# Patient Record
Sex: Male | Born: 1950
Health system: Southern US, Community
[De-identification: ages and names within clinical notes are randomized; demographics above are authoritative.]

## PROBLEM LIST (undated history)

## (undated) DIAGNOSIS — I6529 Occlusion and stenosis of unspecified carotid artery: Secondary | ICD-10-CM

## (undated) DIAGNOSIS — F17200 Nicotine dependence, unspecified, uncomplicated: Secondary | ICD-10-CM

## (undated) DIAGNOSIS — R011 Cardiac murmur, unspecified: Secondary | ICD-10-CM

## (undated) DIAGNOSIS — J302 Other seasonal allergic rhinitis: Secondary | ICD-10-CM

## (undated) DIAGNOSIS — G709 Myoneural disorder, unspecified: Secondary | ICD-10-CM

## (undated) DIAGNOSIS — G4730 Sleep apnea, unspecified: Secondary | ICD-10-CM

## (undated) DIAGNOSIS — I350 Nonrheumatic aortic (valve) stenosis: Secondary | ICD-10-CM

## (undated) DIAGNOSIS — F1010 Alcohol abuse, uncomplicated: Secondary | ICD-10-CM

## (undated) DIAGNOSIS — I739 Peripheral vascular disease, unspecified: Secondary | ICD-10-CM

## (undated) DIAGNOSIS — F32A Depression, unspecified: Secondary | ICD-10-CM

## (undated) DIAGNOSIS — M1990 Unspecified osteoarthritis, unspecified site: Secondary | ICD-10-CM

## (undated) DIAGNOSIS — G8929 Other chronic pain: Secondary | ICD-10-CM

## (undated) DIAGNOSIS — H269 Unspecified cataract: Secondary | ICD-10-CM

## (undated) DIAGNOSIS — I2510 Atherosclerotic heart disease of native coronary artery without angina pectoris: Secondary | ICD-10-CM

## (undated) DIAGNOSIS — Z720 Tobacco use: Secondary | ICD-10-CM

## (undated) DIAGNOSIS — I452 Bifascicular block: Secondary | ICD-10-CM

## (undated) DIAGNOSIS — J189 Pneumonia, unspecified organism: Secondary | ICD-10-CM

## (undated) DIAGNOSIS — T7840XA Allergy, unspecified, initial encounter: Secondary | ICD-10-CM

## (undated) DIAGNOSIS — K759 Inflammatory liver disease, unspecified: Secondary | ICD-10-CM

## (undated) DIAGNOSIS — E785 Hyperlipidemia, unspecified: Secondary | ICD-10-CM

## (undated) DIAGNOSIS — I4510 Unspecified right bundle-branch block: Secondary | ICD-10-CM

## (undated) DIAGNOSIS — Z808 Family history of malignant neoplasm of other organs or systems: Secondary | ICD-10-CM

## (undated) DIAGNOSIS — F429 Obsessive-compulsive disorder, unspecified: Secondary | ICD-10-CM

## (undated) DIAGNOSIS — F319 Bipolar disorder, unspecified: Secondary | ICD-10-CM

## (undated) DIAGNOSIS — I10 Essential (primary) hypertension: Secondary | ICD-10-CM

## (undated) DIAGNOSIS — M549 Dorsalgia, unspecified: Secondary | ICD-10-CM

## (undated) DIAGNOSIS — M48061 Spinal stenosis, lumbar region without neurogenic claudication: Secondary | ICD-10-CM

## (undated) DIAGNOSIS — G629 Polyneuropathy, unspecified: Secondary | ICD-10-CM

## (undated) DIAGNOSIS — F329 Major depressive disorder, single episode, unspecified: Secondary | ICD-10-CM

## (undated) HISTORY — DX: Polyneuropathy, unspecified: G62.9

## (undated) HISTORY — PX: BACK SURGERY: SHX140

## (undated) HISTORY — DX: Nicotine dependence, unspecified, uncomplicated: F17.200

## (undated) HISTORY — DX: Nonrheumatic aortic (valve) stenosis: I35.0

## (undated) HISTORY — DX: Obsessive-compulsive disorder, unspecified: F42.9

## (undated) HISTORY — DX: Peripheral vascular disease, unspecified: I73.9

## (undated) HISTORY — DX: Myoneural disorder, unspecified: G70.9

## (undated) HISTORY — PX: TONSILLECTOMY: SUR1361

## (undated) HISTORY — DX: Unspecified cataract: H26.9

## (undated) HISTORY — PX: ELBOW SURGERY: SHX618

## (undated) HISTORY — DX: Alcohol abuse, uncomplicated: F10.10

## (undated) HISTORY — DX: Tobacco use: Z72.0

## (undated) HISTORY — DX: Allergy, unspecified, initial encounter: T78.40XA

## (undated) HISTORY — DX: Other seasonal allergic rhinitis: J30.2

## (undated) HISTORY — DX: Family history of malignant neoplasm of other organs or systems: Z80.8

## (undated) HISTORY — DX: Spinal stenosis, lumbar region without neurogenic claudication: M48.061

## (undated) HISTORY — DX: Hyperlipidemia, unspecified: E78.5

## (undated) HISTORY — DX: Occlusion and stenosis of unspecified carotid artery: I65.29

## (undated) HISTORY — DX: Bipolar disorder, unspecified: F31.9

## (undated) HISTORY — DX: Cardiac murmur, unspecified: R01.1

## (undated) HISTORY — PX: VASECTOMY: SHX75

## (undated) HISTORY — PX: HERNIA REPAIR: SHX51

## (undated) HISTORY — DX: Sleep apnea, unspecified: G47.30

## (undated) HISTORY — DX: Bifascicular block: I45.2

## (undated) HISTORY — DX: Atherosclerotic heart disease of native coronary artery without angina pectoris: I25.10

## (undated) HISTORY — PX: VASECTOMY REVERSAL: SHX243

## (undated) HISTORY — DX: Essential (primary) hypertension: I10

## (undated) NOTE — *Deleted (*Deleted)
  AAA Progress Note    10/06/2020 7:55 AM 3 Days Post-Op  Subjective:  ***  Afebrile HR 60's-70's NSR 150's systolic 97% 1LO2NC  Gtts:  None   Vitals:   10/06/20 0352 10/06/20 0746  BP: (!) 156/72 (!) 154/69  Pulse: 67 71  Resp: 17 18  Temp: 98.2 F (36.8 C) 98.1 F (36.7 C)  SpO2: 95% 97%    Physical Exam: Cardiac:  *** Lungs:  *** Abdomen:  *** Incisions:  *** Extremities:  *** General:  ***  CBC    Component Value Date/Time   WBC 12.4 (H) 10/06/2020 0048   RBC 3.21 (L) 10/06/2020 0048   HGB 10.0 (L) 10/06/2020 0048   HGB 16.1 11/02/2019 1000   HCT 30.0 (L) 10/06/2020 0048   HCT 47.2 11/02/2019 1000   PLT 82 (L) 10/06/2020 0048   PLT 177 11/02/2019 1000   MCV 93.5 10/06/2020 0048   MCV 90 11/02/2019 1000   MCH 31.2 10/06/2020 0048   MCHC 33.3 10/06/2020 0048   RDW 14.6 10/06/2020 0048   RDW 13.6 11/02/2019 1000   LYMPHSABS 1.7 11/28/2019 0404   LYMPHSABS 2.5 11/02/2019 1000   MONOABS 1.1 (H) 11/28/2019 0404   EOSABS 0.1 11/28/2019 0404   EOSABS 0.2 11/02/2019 1000   BASOSABS 0.0 11/28/2019 0404   BASOSABS 0.1 11/02/2019 1000    BMET    Component Value Date/Time   NA 141 10/06/2020 0048   NA 139 04/16/2020 1046   K 4.6 10/06/2020 0048   CL 111 10/06/2020 0048   CO2 19 (L) 10/06/2020 0048   GLUCOSE 109 (H) 10/06/2020 0048   BUN 24 (H) 10/06/2020 0048   BUN 14 04/16/2020 1046   CREATININE 0.92 10/06/2020 0048   CREATININE 0.77 06/29/2017 1534   CALCIUM 8.0 (L) 10/06/2020 0048   GFRNONAA >60 10/06/2020 0048   GFRAA 98 04/16/2020 1046    INR    Component Value Date/Time   INR 1.3 (H) 10/03/2020 1621     Intake/Output Summary (Last 24 hours) at 10/06/2020 0755 Last data filed at 10/06/2020 0746 Gross per 24 hour  Intake 2258.88 ml  Output 1445 ml  Net 813.88 ml     Assessment/Plan:  82 y.o. male is s/p  *** 3 Days Post-Op  -Vascular:  *** -Cardiac:  Hemodynamically stable in NSR.   -Pulmonary:  *** -Neuro:  ***  -Renal:  Creatinine back to normal at 0.92.  Hyperkalemia resolved.  -GI:  *** -Incisions:  *** -Heme/ID:  Acute surgical blood loss anemia drifted down slightly today but stable.  Pt is tolerating.  Thrombocytopenia improved today.  -General:  Doreatha Massed, PA-C [facility] [phone] 10/06/2020 7:55 AM

---
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
## 2007-12-23 HISTORY — PX: COLONOSCOPY: SHX174

## 2008-07-26 ENCOUNTER — Ambulatory Visit: Payer: Self-pay | Admitting: Family Medicine

## 2008-09-08 ENCOUNTER — Ambulatory Visit: Payer: Self-pay | Admitting: Family Medicine

## 2008-09-08 ENCOUNTER — Encounter: Payer: Self-pay | Admitting: Gastroenterology

## 2008-10-18 ENCOUNTER — Ambulatory Visit: Payer: Self-pay | Admitting: Gastroenterology

## 2008-10-18 ENCOUNTER — Ambulatory Visit: Payer: Self-pay | Admitting: Family Medicine

## 2008-10-27 ENCOUNTER — Telehealth: Payer: Self-pay | Admitting: Gastroenterology

## 2008-11-01 ENCOUNTER — Ambulatory Visit: Payer: Self-pay | Admitting: Gastroenterology

## 2008-11-01 ENCOUNTER — Encounter: Payer: Self-pay | Admitting: Gastroenterology

## 2008-11-04 ENCOUNTER — Encounter: Payer: Self-pay | Admitting: Gastroenterology

## 2009-01-03 ENCOUNTER — Ambulatory Visit: Payer: Self-pay | Admitting: Family Medicine

## 2009-06-28 ENCOUNTER — Ambulatory Visit: Payer: Self-pay | Admitting: Family Medicine

## 2009-06-28 ENCOUNTER — Encounter: Admission: RE | Admit: 2009-06-28 | Discharge: 2009-06-28 | Payer: Self-pay | Admitting: Family Medicine

## 2009-08-03 ENCOUNTER — Observation Stay (HOSPITAL_COMMUNITY): Admission: EM | Admit: 2009-08-03 | Discharge: 2009-08-04 | Payer: Self-pay | Admitting: Emergency Medicine

## 2009-08-06 ENCOUNTER — Ambulatory Visit: Payer: Self-pay | Admitting: Family Medicine

## 2010-02-05 ENCOUNTER — Ambulatory Visit: Payer: Self-pay | Admitting: Family Medicine

## 2010-04-11 ENCOUNTER — Ambulatory Visit: Payer: Self-pay | Admitting: Family Medicine

## 2010-05-02 ENCOUNTER — Encounter: Admission: RE | Admit: 2010-05-02 | Discharge: 2010-05-02 | Payer: Self-pay | Admitting: Surgery

## 2010-05-22 ENCOUNTER — Ambulatory Visit: Payer: Self-pay | Admitting: Family Medicine

## 2010-06-20 HISTORY — PX: US ECHOCARDIOGRAPHY: HXRAD669

## 2010-07-03 ENCOUNTER — Ambulatory Visit: Payer: Self-pay | Admitting: Vascular Surgery

## 2010-10-08 ENCOUNTER — Ambulatory Visit: Payer: Self-pay | Admitting: Family Medicine

## 2010-11-22 ENCOUNTER — Ambulatory Visit: Payer: Self-pay | Admitting: Family Medicine

## 2010-11-22 ENCOUNTER — Encounter: Admission: RE | Admit: 2010-11-22 | Discharge: 2010-11-22 | Payer: Self-pay | Admitting: Family Medicine

## 2011-01-17 ENCOUNTER — Ambulatory Visit: Payer: Self-pay | Admitting: Cardiology

## 2011-01-22 ENCOUNTER — Encounter: Payer: Self-pay | Admitting: Emergency Medicine

## 2011-03-30 LAB — CK TOTAL AND CKMB (NOT AT ARMC)
CK, MB: 7.1 ng/mL — ABNORMAL HIGH (ref 0.3–4.0)
Relative Index: 5.4 — ABNORMAL HIGH (ref 0.0–2.5)
Total CK: 131 U/L (ref 7–232)

## 2011-03-30 LAB — DIFFERENTIAL
Basophils Absolute: 0 10*3/uL (ref 0.0–0.1)
Basophils Relative: 0 % (ref 0–1)
Eosinophils Absolute: 0 10*3/uL (ref 0.0–0.7)
Eosinophils Relative: 0 % (ref 0–5)
Lymphocytes Relative: 22 % (ref 12–46)
Lymphs Abs: 1.9 10*3/uL (ref 0.7–4.0)
Monocytes Absolute: 0.9 10*3/uL (ref 0.1–1.0)
Monocytes Relative: 11 % (ref 3–12)
Neutro Abs: 5.6 10*3/uL (ref 1.7–7.7)
Neutrophils Relative %: 67 % (ref 43–77)

## 2011-03-30 LAB — CBC
HCT: 41.4 % (ref 39.0–52.0)
HCT: 45.5 % (ref 39.0–52.0)
Hemoglobin: 14.4 g/dL (ref 13.0–17.0)
Hemoglobin: 15.8 g/dL (ref 13.0–17.0)
MCHC: 34.7 g/dL (ref 30.0–36.0)
MCHC: 34.9 g/dL (ref 30.0–36.0)
MCV: 96.8 fL (ref 78.0–100.0)
MCV: 97.4 fL (ref 78.0–100.0)
Platelets: 188 10*3/uL (ref 150–400)
Platelets: 232 10*3/uL (ref 150–400)
RBC: 4.24 MIL/uL (ref 4.22–5.81)
RBC: 4.7 MIL/uL (ref 4.22–5.81)
RDW: 13.6 % (ref 11.5–15.5)
RDW: 13.6 % (ref 11.5–15.5)
WBC: 8.4 10*3/uL (ref 4.0–10.5)
WBC: 8.9 10*3/uL (ref 4.0–10.5)

## 2011-03-30 LAB — BASIC METABOLIC PANEL
BUN: 10 mg/dL (ref 6–23)
BUN: 9 mg/dL (ref 6–23)
CO2: 27 mEq/L (ref 19–32)
CO2: 28 mEq/L (ref 19–32)
Calcium: 8.5 mg/dL (ref 8.4–10.5)
Calcium: 8.8 mg/dL (ref 8.4–10.5)
Chloride: 102 mEq/L (ref 96–112)
Chloride: 104 mEq/L (ref 96–112)
Creatinine, Ser: 0.66 mg/dL (ref 0.4–1.5)
Creatinine, Ser: 0.72 mg/dL (ref 0.4–1.5)
GFR calc Af Amer: >60 mL/min (ref >60)
GFR calc Af Amer: >60 mL/min (ref >60)
GFR calc non Af Amer: >60 mL/min (ref >60)
GFR calc non Af Amer: >60 mL/min (ref >60)
Glucose, Bld: 132 mg/dL — ABNORMAL HIGH (ref 70–99)
Glucose, Bld: 97 mg/dL (ref 70–99)
Potassium: 3.7 mEq/L (ref 3.5–5.1)
Potassium: 4.1 mEq/L (ref 3.5–5.1)
Sodium: 136 mEq/L (ref 135–145)
Sodium: 142 mEq/L (ref 135–145)

## 2011-03-30 LAB — CARDIAC PANEL(CRET KIN+CKTOT+MB+TROPI)
CK, MB: 8.2 ng/mL — ABNORMAL HIGH (ref 0.3–4.0)
CK, MB: 8.4 ng/mL — ABNORMAL HIGH (ref 0.3–4.0)
Relative Index: 6.5 — ABNORMAL HIGH (ref 0.0–2.5)
Relative Index: 6.8 — ABNORMAL HIGH (ref 0.0–2.5)
Total CK: 121 U/L (ref 7–232)
Total CK: 129 U/L (ref 7–232)
Troponin I: 0.02 ng/mL (ref 0.00–0.06)
Troponin I: 0.03 ng/mL (ref 0.00–0.06)

## 2011-03-30 LAB — RAPID URINE DRUG SCREEN, HOSP PERFORMED
Amphetamines: NOT DETECTED
Barbiturates: NOT DETECTED
Benzodiazepines: NOT DETECTED
Cocaine: NOT DETECTED
Opiates: NOT DETECTED
Tetrahydrocannabinol: NOT DETECTED

## 2011-03-30 LAB — TROPONIN I: Troponin I: 0.04 ng/mL (ref 0.00–0.06)

## 2011-03-30 LAB — LIPID PANEL
Cholesterol: 158 mg/dL (ref 0–200)
HDL: 57 mg/dL (ref >39)
LDL Cholesterol: 71 mg/dL (ref 0–99)
Total CHOL/HDL Ratio: 2.8 RATIO
Triglycerides: 149 mg/dL (ref <150)
VLDL: 30 mg/dL (ref 0–40)

## 2011-03-30 LAB — ETHANOL: Alcohol, Ethyl (B): <5 mg/dL (ref 0–10)

## 2011-03-30 LAB — D-DIMER, QUANTITATIVE: D-Dimer, Quant: 0.34 ug/mL-FEU (ref 0.00–0.48)

## 2011-03-30 LAB — TSH: TSH: 2.679 u[IU]/mL (ref 0.350–4.500)

## 2011-03-31 ENCOUNTER — Telehealth: Payer: Self-pay | Admitting: Cardiology

## 2011-03-31 ENCOUNTER — Telehealth: Payer: Self-pay | Admitting: *Deleted

## 2011-03-31 NOTE — Telephone Encounter (Signed)
Gregory [name]: Wants billing information from 2011 with [facility]  Gregory [name]: Wants last referral that Dr. [place] gave him so that he can call

## 2011-03-31 NOTE — Telephone Encounter (Signed)
SENT PATIENT DH FROM 2011.

## 2011-03-31 NOTE — Telephone Encounter (Signed)
Called wanting the group that he was sent to last year for doppler study. Gave him [facility].

## 2011-04-16 ENCOUNTER — Other Ambulatory Visit: Payer: Self-pay | Admitting: Emergency Medicine

## 2011-04-16 DIAGNOSIS — R059 Cough, unspecified: Secondary | ICD-10-CM

## 2011-04-16 DIAGNOSIS — R05 Cough: Secondary | ICD-10-CM

## 2011-04-28 ENCOUNTER — Ambulatory Visit
Admission: RE | Admit: 2011-04-28 | Discharge: 2011-04-28 | Disposition: A | Discharge status: Home / Self Care | Payer: BC Managed Care – PPO | Source: Ambulatory Visit | Attending: Emergency Medicine | Admitting: Emergency Medicine

## 2011-04-28 ENCOUNTER — Encounter: Payer: Self-pay | Admitting: Emergency Medicine

## 2011-04-28 DIAGNOSIS — R05 Cough: Secondary | ICD-10-CM

## 2011-04-28 DIAGNOSIS — R059 Cough, unspecified: Secondary | ICD-10-CM

## 2011-04-28 MED ORDER — IOHEXOL 300 MG/ML  SOLN
75.0000 mL | Freq: Once | INTRAMUSCULAR | Status: AC | PRN
  Administered 2011-04-28: 75 mL via INTRAVENOUS

## 2011-05-05 ENCOUNTER — Ambulatory Visit (INDEPENDENT_AMBULATORY_CARE_PROVIDER_SITE_OTHER): Payer: BC Managed Care – PPO | Admitting: Family Medicine

## 2011-05-05 ENCOUNTER — Encounter: Payer: Self-pay | Admitting: Family Medicine

## 2011-05-05 DIAGNOSIS — G5620 Lesion of ulnar nerve, unspecified upper limb: Secondary | ICD-10-CM

## 2011-05-05 DIAGNOSIS — J309 Allergic rhinitis, unspecified: Secondary | ICD-10-CM

## 2011-05-05 DIAGNOSIS — J329 Chronic sinusitis, unspecified: Secondary | ICD-10-CM

## 2011-05-05 NOTE — Progress Notes (Signed)
  Subjective:    Patient ID: Gregory [name], male    DOB: 02-Jan-1951, 60 y.o.   MRN: [id number]  Sinus Problem  Hand Injury   Hand Pain    he has been having difficulty with sinus problems since January. He has been seen in an urgent care Center on several occasions. He has been on 2 different rounds of antibiotics and has had x-rays. Most recently he was given Avelox and prednisone. He finished this oxylate 10 days ago and states that he is still having difficulty with earache, itching ears, chills, itchy eyes . He states that they did an x-ray and also an MRI to rule out any Pap quality which was apparently negative. She is complaining earache, itching, chills no rhinorrhea or PND. He does continue to smoke. He also continues on other medications listed in the chart. He complains of bilateral tingling sensation in the fourth and fifth fingers of both hands. He cannot relate this to wrist or elbow position. He has not dropped anything. He also complains of discoloration especially in the right arm.    Review of Systems    negative except as above Objective:   Physical Exam  Constitutional: He is oriented to person, place, and time. He appears well-nourished.  HENT:  Head: Normocephalic.  Right Ear: External ear normal.  Left Ear: External ear normal.  Nose: Nose normal.  Mouth/Throat: Oropharynx is clear and moist.  Eyes: Conjunctivae are normal. Pupils are equal, round, and reactive to light.  Neck: Normal range of motion. No thyromegaly present.  Cardiovascular: Normal rate and regular rhythm.  Exam reveals no gallop.   No murmur heard. Pulmonary/Chest: Effort normal. He has wheezes.  Lymphadenopathy:    He has no cervical adenopathy.  Neurological: He is alert and oriented to person, place, and time. No cranial nerve deficit.  Skin: Skin is warm and dry.  Psychiatric: He has a normal mood and affect.   exam of both arms does show recent trauma some discoloration to the right  wrist. Full motion in the elbow. Normal hand strength. Resonant to bilaterally.        Assessment & Plan:  Probable unresolved sinusitis in this smoker. Possible bilateral ulnar neuropathy Asthma He is to take his Advair regularly but she admits she has not done. I will give him Avelox. Recommend heat pack of his symptoms regarding the angling sensation and let me know if wrist or elbow position makes a difference.

## 2011-05-05 NOTE — Patient Instructions (Signed)
The attention to elbow position in regard to the symptoms in both hands. The antibiotic and continue on Advair as well as use Claritin recheck here in 2 weeks

## 2011-05-06 NOTE — Discharge Summary (Signed)
Gregory [name], Gregory [name]            ACCOUNT NO.:  [id number]   MEDICAL RECORD NO.:  [id number]          PATIENT TYPE:  INP   LOCATION:  3014                         FACILITY:  [facility]   PHYSICIAN:  Isidor Holts, M.D.  DATE OF BIRTH:  1951/12/22   DATE OF ADMISSION:  08/03/2009  DATE OF DISCHARGE:  08/04/2009                               DISCHARGE SUMMARY   PRIMARY MD:  [name], M.D.   PRIMARY PSYCHIATRIST:  [name], M.D.   PRIMARY ORTHOPEDIC SURGEON:  [name], M.D.   DISCHARGE DIAGNOSES:  1. Uncontrolled hypertension/hypertensive urgency.  2. Exacerbation of bronchial asthma.  3. Allergic rhinitis.  4. Bipolar disorder.  5. History of partial seizure disorder.  6. Smoking history.  7. Back pain.  8. Dyslipidemia.  9. Alcohol excess.   DISCHARGE MEDICATIONS:  1. Depakote ER 1000 mg p.o. q.h.s.  2. Lamictal 200 mg p.o. daily.  3. Lisinopril/Hydrochlorothiazide (20/12.5) one p.o. daily.  4. Albuterol inhaler 2 puffs p.r.n. q.4-6 hourly.  5. Prednisone taper in pre-admission dosage.  6. Clonidine 0.2 mg p.o. t.i.d.  7. Flonase nasal spray, 2 sprays each nostril daily p.r.n.   Note:  Ibuprofen has been discontinued.   PROCEDURES:  Chest x-ray done August 03, 2009.  This showed mild chronic-  appearing bronchitis-type changes, no infiltrates, edema or effusions.   CONSULTATIONS:  None.   ADMISSION HISTORY:  As in H and P notes of August 03, 2009.  However in  brief, this is a 60 year old male, with known history of bipolar  disorder, partial seizure disorder, hypertension, dyslipidemia, history  of mononucleosis with hepatitis while in college approximately 40 years  ago, bronchial asthma since childhood, perennial allergic rhinitis,  smoking history, status post fall early in July 2010, back pain  secondary to this, presenting with increased shortness of breath and  severe uncontrolled hypertension.  Apparently he went to bed at about 9  p.m. on August 02, 2009, awoke in the morning feeling very lethargic and  subsequently at work became slightly short of breath and wheezy.  He  tried to drive to his primary MD's office, but felt so bad that he  stopped at [facility], where he was evaluated, found to  have markedly elevated blood pressures at 224/112.  He was nebulized.  EKG was done.  This showed a right bundle branch block pattern.  EMS was  called, and he was transferred to the emergency department at [facility], and subsequently admitted for further evaluation,  investigation and management.   CLINICAL COURSE:  1. Severe uncontrolled hypertension/hypertensive urgency.  For details      of presentation, refer to admission history above.  This was      managed with ACE inhibitor, Hydrochlorothiazide and Clonidine, with      satisfactory clinical response.  As of August 04, 2009, blood      pressure had improved to 173/76 mmHg and the patient was      asymptomatic.  Although this is still uncontrolled, it is a      significant improvement over his presenting blood pressures, and  further improvement is anticipated.  The patient has been      recommended to discontinue Ibuprofen, as this may be contributory      to his elevated blood pressures and to maintain a low-salt diet.      He is to follow up ASAP in the coming week ,with his primary MD,      Dr. Susann Givens, who will titrate his blood pressure medications      further, if indicated.  The patient is agreeable with this plan.   1. Exacerbation of bronchial asthma.  The patient was wheezy at time      of presentation; however, chest x-ray was unremarkable for acute      abnormalities, he had a cough productive of clear phlegm, and which      was managed with bronchodilator nebulizers and a tapering course of      steroids, with satisfactory clinical response.  By August 04, 2009,      he appeared to be at his baseline.  He does have a known history of       allergic rhinitis.  It is possible that this non-infective      exacerbation, is secondary to airborne allergens.   1. Allergic rhinitis.  This was managed with Flonase nasal spray      during the course of this hospitalization, and responded      satisfactorily.   1. Smoking history.  The patient does indeed smoke about 1 and 1/2      packs of cigarettes per day.  He was counseled appropriately and      was managed with Nicoderm CQ patch, in the course of this      hospitalization.   1. Alcohol excess.  The patient states that he drinks approximately 2      glasses of wine each night.  Alcohol level was less than 5, and he      showed no evidence of alcohol withdrawal phenomena, during the      course of this hospitalization.   1. Dyslipidemia.  The patient's lipid profile was as follows:  Total      cholesterol 158, triglyceride 149, HDL 57, LDL 71, i.e. excellent      lipid profile.  He has been reassured accordingly.   1. Back pain.  This, according to the patient, has intermittently      troubled him, since his fall in the early July 2010.  X-ray of the      sacrum, coccyx and lumbar spine on June 28, 2009, showed mild      degenerative disk disease at L4-5 with a 5-mm anterolisthesis of L4      and L5, but there were no acute abnormalities, no acute bony      injuries.  The patient states, however, that he did see Dr. [name], orthopedic surgeon, on August 01, 2009, and was commenced on      a tapering course of steroids, which we have urged him to continue.      Be that as it may, there were no problems referable to his back      pain, during the course of his hospitalization.   DISPOSITION:  The patient was, on August 04, 2009, considered clinically  stable for discharge, and was therefore discharged accordingly.  He is  recommended to return to regular duties on August 09, 2009.   ACTIVITY:  Recommended to increase activity slowly, otherwise as  tolerated.  DIET:  Heart-healthy.   FOLLOWUP INSTRUCTIONS:  The patient is to follow up with his primary MD,  Dr. [name], in the coming week.  He has been instructed to call  for an appointment in a.m. of August 06, 2009.  In addition, he is to  follow up  routinely with his primary psychiatrist, Dr. [name], per prior  scheduled appointment, as well as his orthopedic surgeon, Dr. [name],  per prior scheduled appointment.  All this has been communicated to the  patient, who verbalized understanding.      Isidor Holts, M.D.  Electronically Signed     CO/MEDQ  D:  08/04/2009  T:  08/04/2009  Job:  [id number]   cc:   [name], M.D.  [name], M.D.  [name], M.D.

## 2011-05-06 NOTE — H&P (Signed)
Gregory [name], Gregory NO.:  [id number]   MEDICAL RECORD NO.:  [id number]          PATIENT TYPE:  INP   LOCATION:  1833                         FACILITY:  [facility]   PHYSICIAN:  Isidor Holts, M.D.  DATE OF BIRTH:  [birth year]   DATE OF ADMISSION:  08/03/2009  DATE OF DISCHARGE:                              HISTORY & PHYSICAL   PRIMARY MEDICAL DOCTOR:  Gregory [name], M.D.   PSYCHIATRIST:  Milagros [name], M.D.   PRIMARY ORTHOPEDIC SURGEON:  [name], M.D.   CHIEF COMPLAINT:  Shortness of breath, lethargy, severe uncontrolled  hypertension.   HISTORY OF PRESENT ILLNESS:  This is a 60 year old male.  For past  medical history, see below.  History is obtained from the patient;  however, the patient appears anxious, fidgety, and speech is somewhat  pressured.  He had to be refocused several times, to elucidate a  coherent history.  According to the patient, he had a fall in early July  2010, and thought he had maybe fractured his tail bone.  He came to  the emergency department at that time, and had an x-ray done, which  showed no bony injuries in the coccyx.  Since then, he has been troubled  on and off, by low back pain, for which he utilizes Ibuprofen.  Eventually, he managed to get an appointment with an orthopedic surgeon  Dr. [name] on August 01, 2009.  He was offered intralesional  steroids, but he declined this, and was subsequently given a  prescription for oral Prednisone taper.  He filled the script on August 02, 2009, and took the first dose.  He estimates it was about 10 mg of  Prednisone, then went to bed at about 9:00 p.m. that day.  In the a.m.  of August 03, 2009, he awoke, but was feeling very lethargic, and even  though he had a cup of coffee, which would usually wake him right up,  this did not appear to have any effect.  He went to work at about 8:00  a.m., and by 9:00 a.m. had become sweaty, short of breath, and wheezy.  He denies any chest  time at that time.  He went out to his car to look  for his bronchodilator inhaler, could not find it, continued to feel  very unwell, and then decided to drive  to his primary MD's office. En  route, he felt so bad that he stopped at [facility],  where he was evaluated and found to have a markedly elevated blood  pressure as high as 224/112.  He was given bronchodilator nebulizers.  EKG was done.  Some abnormalities were noted.  EMS was called, and he  was transported to the emergency department at [facility].   PAST MEDICAL HISTORY:  1. Bipolar disorder.  2. History of partial seizure disorder.  3. Hypertension.  4. Dyslipidemia.  5. History of mononucleosis with hepatitis, in college approximately      40 years ago.  6. Bronchial asthma since childhood.  7. Perennial allergic rhinitis.  8. Smoking history.  9. Status post fall  early July 2010.  Evaluated at [facility]      ED.  No bony injury.  Has been troubled by back pain ever since.   MEDICATION HISTORY:  1. Depakote, query dosage, 2 pills at bedtime.  2. Lamictal, query dosage, 1 pill in a.m.  3. Lisinopril/HCT (20/12.5) one p.o. daily.  4. Ibuprofen 800 mg p.o. p.r.n. b.i.d..  5. Albuterol inhaler 2 puffs p.r.n.  6. Prednisone taper.  Took 10 mg on August 02, 2009, none since.   ALLERGIES:  1. NOVOCAIN.  2. CODEINE.  According to the patient, this causes angioedema.   REVIEW OF SYSTEMS:  As per HPI and chief complaint.  Denies abdominal  pain, vomiting or diarrhea.  Denies fever or chills.  Otherwise 10 point  review of systems is negative.   SOCIAL HISTORY:  The patient is married.  Has five daughters and 1  stepdaughter.  Manages a restaurant.  Smokes approximately 1 and 1/2  packet of cigarettes per day.  Has been smoking since age 15 years.  Drinks alcohol, about 1 to 2 glasses of red wine per night.  Denies drug  abuse.   FAMILY HISTORY:  Mother is age 90 years, has skin  cancer and is  hypertensive.  Father is deceased at age 69 years status post recurrent  CVAs Family history is otherwise noncontributory.   PHYSICAL EXAMINATION:  VITALS:  Temperature 98.7, pulse 60 per minute  regular, respiratory 16, BP 188/95 mmHg, rechecked 164/84 mmHg after  Clonidine administered in the emergency department, pulse oximeter 96%  and 2 liters of oxygen.  The patient did not appear to be in obvious acute distress at time of  this evaluation, alert, communicative, somewhat fidgety.  Speech appears  pressured.  Patient feels thirsty.  Continues to ask for a glass of  water; however, fully oriented and not short of breath at rest.  HEENT:  No clinical pallor or jaundice.  No conjunctival injection.  Throat: Visible mucous membranes appear clear.  NECK:  Supple.  JVP not seen.  No palpable lymphadenopathy or palpable  goiter.  CHEST:  Bilateral expiratory wheeze.  No crackles.  HEART:  Sounds, S1 and S2, are heard.  Normal regular, no murmurs.  ABDOMEN:  Flat, soft, nontender.  No palpable organomegaly or palpable  masses.  Normal bowel sounds.  LOWER EXTREMITIES:  No pitting edema.  Palpable peripheral pulses.  Status system quite unremarkable.  CENTRAL NERVOUS SYSTEM:  No focal neurologic deficit on gross  examination.   INVESTIGATIONS:  CBC:  WBC 8.4, hemoglobin 15.8, hematocrit 45.5,  platelets 232.  Electrolytes:  Sodium 142, potassium 3.7, chloride 104,  CO2 28, BUN 9, creatinine 0.06, glucose 97.   A 12-lead EKG done on August 03, 2009 showed sinus rhythm, regular 65  per minute, right bundle branch block pattern.  No acute ischemic  changes.   ASSESSMENT AND PLAN:  1. Severe uncontrolled hypertension/hypertensive urgency.  We shall      continue the patient's Lisinopril.  Hold Hydrochlorothiazide for      now, as the patient appears to be mildly clinically dehydrated,      commence scheduled Clonidine 0.1 mg p.o. t.i.d. and utilize p.r.n.      IV  hydralazine.  Meanwhile, we shall place the patient in the step-      down unit for close observation.   1. Shortness of breath.  This is likely secondary to the patient's      bronchial asthma, although it is  doubtful that he is having an      acute exacerbation.  We shall manage with bronchodilator      nebulizers, check D-dimer for completeness, although PE does not      does not seem likely at present.  Do chest x-ray, utilize      supplemental O2.   1. Allergic rhinitis.  We shall manage this with steroid nasal spray.   1. History of back pain:  This does not appear problematic at this      time.  We shall hold the patient's Prednisone, as it is not clear      whether his lethargy was occasioned by this.   1. Smoking history.  The patient has been counseled.  We shall manage      with Nicoderm CQ patch.   1. History of dyslipidemia.  We shall check lipid profile and do TSH      for completeness.   1. Alcohol excess.  We shall check serum alcohol level, to rule out      acute alcoholic intoxication, and for completeness, do urine drug      screen.  Meanwhile, we shall commence the patient on Thiamine      supplement.   1. Bipolar disorder.  The patient's speech appears somewhat pressured      at this time.  He may benefit from psychiatric input during this      hospitalization.  For now, we shall utilize p.r.n. Ativan for      severe agitation.   Further management will depend on clinical course.      Isidor Holts, M.D.  Electronically Signed     CO/MEDQ  D:  08/03/2009  T:  08/03/2009  Job:  [id number]   cc:   Gregory [name], M.D.  [name], M.D.  Gregory [name], M.D.

## 2011-05-06 NOTE — Procedures (Signed)
CAROTID DUPLEX EXAM   INDICATION:  Right carotid bruit.   HISTORY:  Diabetes:  No.  Cardiac:  No.  Hypertension:  Yes.  Smoking:  Yes.  Previous Surgery:  No.  CV History:  Asymptomatic.  Amaurosis Fugax No, Paresthesias No, Hemiparesis No.                                       RIGHT             LEFT  Brachial systolic pressure:         153               150  Brachial Doppler waveforms:         Normal            Normal  Vertebral direction of flow:        Antegrade         Antegrade  DUPLEX VELOCITIES (cm/sec)  CCA peak systolic                   60                65  ECA peak systolic                   67                76  ICA peak systolic                   54                76  ICA end diastolic                   18                34  PLAQUE MORPHOLOGY:                  Calcific          Calcific  PLAQUE AMOUNT:                      Minimal           Minimal  PLAQUE LOCATION:                    ICA/CCA           ICA/CCA   IMPRESSION:  1. Doppler velocities suggest 1% to 39% stenosis in the bilateral      internal carotid arteries.  2. Antegrade flow in bilateral vertebral arteries.   ________________________________________  Janetta Hora Fields, MD   NT/MEDQ  D:  07/03/2010  T:  07/03/2010  Job:  [id number]

## 2011-05-21 ENCOUNTER — Ambulatory Visit (INDEPENDENT_AMBULATORY_CARE_PROVIDER_SITE_OTHER): Payer: BC Managed Care – PPO | Admitting: Medical

## 2011-05-21 ENCOUNTER — Encounter: Payer: Self-pay | Admitting: Medical

## 2011-05-21 VITALS — BP 130/80 | HR 64 | Temp 99.0°F | Ht 65.0 in | Wt 132.0 lb

## 2011-05-21 DIAGNOSIS — S0990XA Unspecified injury of head, initial encounter: Secondary | ICD-10-CM

## 2011-05-21 DIAGNOSIS — F319 Bipolar disorder, unspecified: Secondary | ICD-10-CM

## 2011-05-21 DIAGNOSIS — F17200 Nicotine dependence, unspecified, uncomplicated: Secondary | ICD-10-CM

## 2011-05-21 DIAGNOSIS — W1800XA Striking against unspecified object with subsequent fall, initial encounter: Secondary | ICD-10-CM

## 2011-05-21 DIAGNOSIS — R29818 Other symptoms and signs involving the nervous system: Secondary | ICD-10-CM

## 2011-05-21 DIAGNOSIS — W1809XA Striking against other object with subsequent fall, initial encounter: Secondary | ICD-10-CM

## 2011-05-21 DIAGNOSIS — F1010 Alcohol abuse, uncomplicated: Secondary | ICD-10-CM

## 2011-05-21 MED ORDER — ALBUTEROL SULFATE HFA 108 (90 BASE) MCG/ACT IN AERS: 2.0000 | INHALATION_SPRAY | Freq: Four times a day (QID) | RESPIRATORY_TRACT | Status: DC | PRN

## 2011-05-21 NOTE — Progress Notes (Signed)
Subjective:   HPI  Gregory [name] is a 60 y.o. male who presents for fall.  This past Sunday 4 days ago, went to go out the door, went to grab the door handle and this slipped out of his hand.  He fell backwards hitting head on corner of a table frame.  At that time had pain over his left temple where he had sustained a laceratoin.  This stopped bleeding quickly.  He notes that he has a hx/o nerve damage in his hand and only has use of 3 fingers on each hand which led to his hand slipping off the handle.  He had also has a few glasses of wine that night.  Currently nothing hurts him today, but wife wanting him to come in as a precaution for checkup.  He notes that he and wife looked up signs of concussion and he didn't exhibit any signs.  However, he did vomit once on Tuesday night after eating some "bad lasagna with strong garlic."   Denies headache, vision change, no confusion, no numbness or tingling.  He does note chronic numbness of both ring and pinky fingers on both sides. He has been seeing orthopedic for his numbness and arm issues/chronic nerve damage.    He notes a chronic history of shaking, relates this to Lamictal as this only occurs within 4 hours of taking the medication.  He does report drinking more alcohol recent, up to 4 drinks daily usually wine. He and his wife plan to see his psychiatrist next week to discuss his medication not helping, and his increased alcohol use.  He last drank a sip of wine to wash down his medication today, prior to that he drank 4 glasses of wine yesterday.  No other c/o.  The following portions of the patient's history were reviewed and updated as appropriate: allergies, current medications, past family history, past medical history, past social history, past surgical history and problem list.  Past Medical History  Diagnosis Date  . Allergy   . Hypertension   . Bipolar disorder   . Alcohol abuse   . Tobacco use disorder   . Sleep apnea   . Asthma    . Peripheral vascular disease   . Aortic stenosis     Review of Systems Constitutional: denies fever, chills, sweats, unexpected weight change, anorexia, fatigue Allergy: congestion, sneezing Dermatology: denies rash ENT: no runny nose, ear pain, sore throat, hoarseness, sinus pain Cardiology: denies chest pain, palpitations, edema Respiratory: denies cough, shortness of breath, wheezing Gastroenterology: denies abdominal pain, nausea, vomiting, diarrhea, constipation,  Hematology: denies bleeding or bruising problems Ophthalmology: denies vision changes Urology: denies dysuria, difficulty urinating, hematuria, urinary frequency, urgency Neurology: no headache, weakness     Objective:   Physical Exam   General appearance: alert, no distress, WD/WN Skin: Right forearm with a 3 cm diameter area of purple ecchymosis otherwise no lesions HEENT: Left temple region with a 4 cm linear superficial laceration that has sanguinous crusting and healing appropriately, no warmth or redness. Head is nontender otherwise, sclerae anicteric, PERRLA, EOMi, nares patent, no discharge or erythema, TMs pearly, no battle signs behind the ears, pharynx normal Oral cavity: MMM, no lesions Neck: supple, no lymphadenopathy, no thyromegaly, no masses Heart: Systolic 3/6 murmur heard best in upper sternal borders, otherwise normal S1 normal S2, regular rate and rhythm Lungs: CTA bilaterally, no wheezes, rhonchi, or rales Abdomen: +bs, soft, non tender, non distended Back: non tender Musculoskeletal: Mild tenderness of his right wrist and  hand, otherwise nontender, no obvious deformity Extremities: no edema, no cyanosis, no clubbing Pulses: 2+ symmetric, upper and lower extremities, normal cap refill Neurological: alert, oriented x 3, CN2-12 intact, there is a generalized tremor with activity, but not at rest, this is seen in his arms and legs, finger to nose was a little imprecise, heel to toe also somewhat  off balance, otherwise no focal signs; he reports that the abnormalities noted today are not new, that this has been going on for months. Psychiatric: normal affect, behavior normal, pleasant    Assessment :    Encounter Diagnoses  Name Primary?  . Fall against object Yes  . Alcohol abuse   . Tobacco use disorder   . Bipolar disorder   . Head injury, unspecified   . Extrapyramidal symptom      Plan:   Fall-CT head to rule out worrisome finding such as a bleed  Alcohol abuse-he admits this is a current problem, he is using alcohol as a coping mechanism for his depression and bipolar symptoms. He and his wife will be seeing his psychiatrist Gregory [name] next week. I recommend he cut down on alcohol use, discussed risk of this including medication interaction, possible contribution to his fall recently, and other health risk in general.  Tobacco use disorder-advised he consider cutting back on tobacco use especially in light of his asthma flare today.  Bipolar disorder-not controlled on current medications, follow up with psychiatry next week.  Head injury-he has a minor laceration to his left temple region that is healing up appropriately, and he is up-to-date on tetanus vaccine within the last 5 years per his self-report.  CT head to rule out bleed.  Extrapyramidal symptoms-he has a noticeable generalized action tremor today that I suspect is related to his medications. He will discuss this with his psychiatrist.  We will also get head CT.   We will call to check up on him next week.

## 2011-05-21 NOTE — Patient Instructions (Signed)
Advised he cut down his alcohol use and tobacco use, advised he followup with his psychiatrist next week as scheduled.

## 2011-05-22 ENCOUNTER — Telehealth: Payer: Self-pay | Admitting: Medical

## 2011-05-22 NOTE — Telephone Encounter (Signed)
After further thought on his exam findings yesterday, and after talking to Dr. Susann Givens about his fall, we want to get a head CT just to make sure nothing is going on such as a slow bleed.  Let him know and please set up CT head.

## 2011-05-22 NOTE — Telephone Encounter (Signed)
Pt was scheduled for a CT scan of the head at [facility] at 10:15 am on 05-26-11.  Pt is aware of appointment and authorization was obtained and faxed to [facility].  CM, LPN

## 2011-05-26 ENCOUNTER — Ambulatory Visit
Admission: RE | Admit: 2011-05-26 | Discharge: 2011-05-26 | Disposition: A | Discharge status: Home / Self Care | Payer: BC Managed Care – PPO | Source: Ambulatory Visit | Attending: Medical | Admitting: Medical

## 2011-05-26 ENCOUNTER — Telehealth: Payer: Self-pay | Admitting: *Deleted

## 2011-05-26 NOTE — Telephone Encounter (Addendum)
Message copied by Dorthula Perfect on Mon May 26, 2011 12:25 PM ------      Message from: Aleen Campi, DAVID S      Created: Mon May 26, 2011 11:58 AM       Head CT shows no bleeding or new abnormality.  Pls forward copy of our notes and labs and head CT to his psychiatrist.  Also verify that he is seeing his psychiatrist in follow up this week.    Pt's wife was notified of CT scan results.  Faxed labs, office notes, and CT scan to DR. Evelene Croon Psychiatrist and was seen by Dr. Evelene Croon last week.  CM, LPN

## 2011-06-24 ENCOUNTER — Telehealth: Payer: Self-pay | Admitting: Cardiology

## 2011-06-24 ENCOUNTER — Other Ambulatory Visit: Payer: Self-pay | Admitting: Family Medicine

## 2011-06-24 DIAGNOSIS — I350 Nonrheumatic aortic (valve) stenosis: Secondary | ICD-10-CM

## 2011-06-24 DIAGNOSIS — I4510 Unspecified right bundle-branch block: Secondary | ICD-10-CM

## 2011-06-24 NOTE — Telephone Encounter (Signed)
Patient called, he is going for surgery for abcess.  Patient states surgery was cancelled due to RBBB.  Patient states he spoke to Dr. Swaziland a few weeks after, and Dr. Swaziland said it would not be a problem for the surgery.  [facility] is doing the surgery.  Please call patient.  Patient was scheduled to see Dr. Swaziland 08/26/2011, earliest appt available.  Patient would like to see about getting cleared for surgery sooner.

## 2011-06-26 NOTE — Telephone Encounter (Signed)
Received call from pt stating he need clearance for anal abscess surgery. Per Dr. Swaziland needs to have lexiscan prior to surgery if going to have gen anesth. Spoke w/ pt and he will agree to have test. Has aortic stenosis;RBBB; and CAD based on extensive calcification on CT. Will schedule; 7/12 at 8:30. Notified pt and gave instructions.

## 2011-07-01 ENCOUNTER — Ambulatory Visit (HOSPITAL_COMMUNITY): Payer: BC Managed Care – PPO | Attending: Cardiology | Admitting: Radiology

## 2011-07-01 DIAGNOSIS — I491 Atrial premature depolarization: Secondary | ICD-10-CM

## 2011-07-01 DIAGNOSIS — I10 Essential (primary) hypertension: Secondary | ICD-10-CM | POA: Insufficient documentation

## 2011-07-01 DIAGNOSIS — I350 Nonrheumatic aortic (valve) stenosis: Secondary | ICD-10-CM

## 2011-07-01 DIAGNOSIS — R0609 Other forms of dyspnea: Secondary | ICD-10-CM | POA: Insufficient documentation

## 2011-07-01 DIAGNOSIS — J4489 Other specified chronic obstructive pulmonary disease: Secondary | ICD-10-CM | POA: Insufficient documentation

## 2011-07-01 DIAGNOSIS — R42 Dizziness and giddiness: Secondary | ICD-10-CM | POA: Insufficient documentation

## 2011-07-01 DIAGNOSIS — I4510 Unspecified right bundle-branch block: Secondary | ICD-10-CM | POA: Insufficient documentation

## 2011-07-01 DIAGNOSIS — R9430 Abnormal result of cardiovascular function study, unspecified: Secondary | ICD-10-CM

## 2011-07-01 DIAGNOSIS — R0989 Other specified symptoms and signs involving the circulatory and respiratory systems: Secondary | ICD-10-CM | POA: Insufficient documentation

## 2011-07-01 DIAGNOSIS — F17200 Nicotine dependence, unspecified, uncomplicated: Secondary | ICD-10-CM | POA: Insufficient documentation

## 2011-07-01 DIAGNOSIS — J449 Chronic obstructive pulmonary disease, unspecified: Secondary | ICD-10-CM | POA: Insufficient documentation

## 2011-07-01 MED ORDER — TECHNETIUM TC 99M TETROFOSMIN IV KIT
33.0000 | PACK | Freq: Once | INTRAVENOUS | Status: AC | PRN
  Administered 2011-07-01: 33 via INTRAVENOUS

## 2011-07-01 MED ORDER — REGADENOSON 0.4 MG/5ML IV SOLN
0.4000 mg | Freq: Once | INTRAVENOUS | Status: AC
  Administered 2011-07-01: 0.4 mg via INTRAVENOUS

## 2011-07-01 MED ORDER — TECHNETIUM TC 99M TETROFOSMIN IV KIT
11.0000 | PACK | Freq: Once | INTRAVENOUS | Status: AC | PRN
  Administered 2011-07-01: 11 via INTRAVENOUS

## 2011-07-01 NOTE — Progress Notes (Signed)
[facility] SITE 3 NUCLEAR MED [address] [place] [address] [phone]  Cardiology Nuclear Med Study  Gregory [name] is a 60 y.o. male [id number] 06/29/1951   Nuclear Med Background Indication for Stress Test:  Evaluation for Ischemia and Surgical Clearance for Abcess by Dr. Harriette Bouillon History:  Asthma, COPD, Emphysema and 4/12 Cardiac VV:[id number] Artery Calcifications Cardiac Risk Factors: Hypertension, (R) Carotid Bruit, RBBB and Smoker  Symptoms:  Dizziness and DOE   Nuclear Pre-Procedure Caffeine/Decaff Intake:  None NPO After: 10:30 am   Lungs:  Clear.  O2 sat 95% on RA. IV 0.9% NS with Angio Cath:  18g  IV Site: R Antecubital  IV Started by:  Stanton Kidney, EMT-P  Chest Size (in):  40 Cup Size: n/a  Height: 5\' 5"  (1.651 m)  Weight:  125 lb (56.7 kg)  BMI:  Body mass index is 20.80 kg/(m^2). Tech Comments:  NA    Nuclear Med Study 1 or 2 day study: 1 day  Stress Test Type:  Lexiscan  Reading MD: Cassell Clement, MD  Order Authorizing Provider:  Peter Swaziland, MD  Resting Radionuclide: Technetium 43m Tetrofosmin  Resting Radionuclide Dose: 11.0 mCi   Stress Radionuclide:  Technetium 9m Tetrofosmin  Stress Radionuclide Dose: 33.0 mCi           Stress Protocol Rest HR: 58 Stress HR: 90  Rest BP: (Sitting) 99/64 (Trendelenburg) 104/46 Stress BP: 114/70  Exercise Time (min): n/a METS: n/a   Predicted Max HR: 159 bpm % Max HR: 56.6 bpm Rate Pressure Product: 06269   Dose of Adenosine (mg):  n/a Dose of Lexiscan: 0.4 mg  Dose of Atropine (mg): n/a Dose of Dobutamine: n/a mcg/kg/min (at max HR)  Stress Test Technologist: Smiley Houseman, CMA-N  Nuclear Technologist:  Domenic Polite, CNMT     Rest Procedure:  Myocardial perfusion imaging was performed at rest 45 minutes following the intravenous administration of Technetium 96m Tetrofosmin.  Rest ECG: RBBB, rare PAC.  Stress Procedure:  The patient received IV Lexiscan 0.4 mg over  15-seconds.  Technetium 79m Tetrofosmin injected at 30-seconds.  There were no significant ST-T wave changes with Lexiscan.  There was a hypotensive response to infusion and patient was give 500 cc normal saline with complete resolution.  Quantitative spect images were obtained after a 45 minute delay.  Stress ECG: No significant change from baseline ECG  QPS Raw Data Images:  Normal; no motion artifact; normal heart/lung ratio. Stress Images:  There is decreased uptake in the apex. Rest Images:  Normal homogeneous uptake in all areas of the myocardium. Subtraction (SDS):  These findings are consistent with ischemia at apex. Transient Ischemic Dilatation (Normal <1.22):  1.11 Lung/Heart Ratio (Normal <0.45):  0.24  Quantitative Gated Spect Images QGS EDV:  77 ml QGS ESV:  15 ml QGS cine images:  NL LV Function; NL Wall Motion QGS EF: 81%  Impression Exercise Capacity:  Lexiscan with no exercise. BP Response:  Hypotensive blood pressure response.  Responded to 500 cc Normal Saline. Clinical Symptoms:  There is dyspnea. No chest pain. ECG Impression:  No significant ST segment change suggestive of ischemia.  RBBB unchanged from baseline. Comparison with Prior Nuclear Study: No previous nuclear study performed  Overall Impression:  Abnormal stress nuclear study.  Cannot exclude small area of reversible apical ischemia in this patient with known heavy coronary artery calcification. LV function is vigorous.   Cassell Clement

## 2011-07-02 NOTE — Progress Notes (Signed)
nuc med report routed to Dr. Swaziland 07/02/11 Domenic Polite

## 2011-07-03 ENCOUNTER — Encounter (HOSPITAL_COMMUNITY): Payer: BC Managed Care – PPO | Admitting: Radiology

## 2011-07-03 NOTE — Progress Notes (Signed)
Notified of nuclear test results. Will send copy to Dr. Luisa Hart and Dr. Susann Givens.

## 2011-07-03 NOTE — Progress Notes (Signed)
Low risk nuclear study. Small area of apical ischemia. Normal EF. He is cleared for rectal surgery. We'll need to see back after to follow up on his CAD. Gregory [name]

## 2011-07-08 ENCOUNTER — Encounter: Payer: Self-pay | Admitting: Family Medicine

## 2011-07-08 ENCOUNTER — Encounter (INDEPENDENT_AMBULATORY_CARE_PROVIDER_SITE_OTHER): Payer: BC Managed Care – PPO | Admitting: Surgery

## 2011-07-09 ENCOUNTER — Encounter: Payer: Self-pay | Admitting: Family Medicine

## 2011-07-09 ENCOUNTER — Ambulatory Visit (INDEPENDENT_AMBULATORY_CARE_PROVIDER_SITE_OTHER): Payer: BC Managed Care – PPO | Admitting: Family Medicine

## 2011-07-09 VITALS — BP 120/70 | HR 84 | Wt 128.0 lb

## 2011-07-09 DIAGNOSIS — G609 Hereditary and idiopathic neuropathy, unspecified: Secondary | ICD-10-CM

## 2011-07-09 DIAGNOSIS — E785 Hyperlipidemia, unspecified: Secondary | ICD-10-CM

## 2011-07-09 DIAGNOSIS — Z209 Contact with and (suspected) exposure to unspecified communicable disease: Secondary | ICD-10-CM

## 2011-07-09 DIAGNOSIS — G629 Polyneuropathy, unspecified: Secondary | ICD-10-CM

## 2011-07-09 DIAGNOSIS — Z5189 Encounter for other specified aftercare: Secondary | ICD-10-CM

## 2011-07-09 LAB — CBC WITH DIFFERENTIAL/PLATELET
Basophils Absolute: 0 10*3/uL (ref 0.0–0.1)
Basophils Relative: 0 % (ref 0–1)
Eosinophils Absolute: 0 10*3/uL (ref 0.0–0.7)
Eosinophils Relative: 1 % (ref 0–5)
HCT: 43.4 % (ref 39.0–52.0)
Hemoglobin: 15.3 g/dL (ref 13.0–17.0)
Lymphocytes Relative: 27 % (ref 12–46)
Lymphs Abs: 1 10*3/uL (ref 0.7–4.0)
MCH: 34.9 pg — ABNORMAL HIGH (ref 26.0–34.0)
MCHC: 35.3 g/dL (ref 30.0–36.0)
MCV: 99.1 fL (ref 78.0–100.0)
Monocytes Absolute: 0.6 10*3/uL (ref 0.1–1.0)
Monocytes Relative: 15 % — ABNORMAL HIGH (ref 3–12)
Neutro Abs: 2.2 10*3/uL (ref 1.7–7.7)
Neutrophils Relative %: 58 % (ref 43–77)
Platelets: 135 10*3/uL — ABNORMAL LOW (ref 150–400)
RBC: 4.38 MIL/uL (ref 4.22–5.81)
RDW: 14 % (ref 11.5–15.5)
WBC: 3.8 10*3/uL — ABNORMAL LOW (ref 4.0–10.5)

## 2011-07-09 LAB — RPR

## 2011-07-09 LAB — COMPREHENSIVE METABOLIC PANEL
ALT: 35 U/L (ref 0–53)
AST: 52 U/L — ABNORMAL HIGH (ref 0–37)
Albumin: 3.9 g/dL (ref 3.5–5.2)
Alkaline Phosphatase: 60 U/L (ref 39–117)
BUN: 7 mg/dL (ref 6–23)
CO2: 24 mEq/L (ref 19–32)
Calcium: 8.9 mg/dL (ref 8.4–10.5)
Chloride: 97 mEq/L (ref 96–112)
Creat: 0.75 mg/dL (ref 0.50–1.35)
Glucose, Bld: 91 mg/dL (ref 70–99)
Potassium: 3.6 mEq/L (ref 3.5–5.3)
Sodium: 133 mEq/L — ABNORMAL LOW (ref 135–145)
Total Bilirubin: 0.8 mg/dL (ref 0.3–1.2)
Total Protein: 6.6 g/dL (ref 6.0–8.3)

## 2011-07-09 LAB — LIPID PANEL
Cholesterol: 124 mg/dL (ref 0–200)
HDL: 71 mg/dL (ref >39)
LDL Cholesterol: 39 mg/dL (ref 0–99)
Total CHOL/HDL Ratio: 1.7 Ratio
Triglycerides: 71 mg/dL (ref <150)
VLDL: 14 mg/dL (ref 0–40)

## 2011-07-09 LAB — FOLATE RBC

## 2011-07-09 NOTE — Progress Notes (Signed)
  Subjective:    Patient ID: Gregory [name], male    DOB: [birth year], 61 y.o.   MRN: [id number]  HPI He is here for a followup visit. He was recently evaluated and found to have a peripheral neuropathy as well as other neurologic issues. The EMG information was reviewed. He also continues to see his psychiatrist. He was recently given a new medication to help with his alcohol consumption. He continues to drink 5 or 6 glasses of wine per day. Apparently his wife's can be placed on medication that'll interfere with her drinking and he thinks this will help him cut down on his alcohol consumption. He also would like to be checked for STD. Apparently he did have unprotected sex several years ago. He has had HIV which was negative. Does have occasional difficulty with asthma but usually uses albuterol only once per week. He continues on his statin drug.   Review of Systems     Objective:   Physical Exam Alert and in no distress. Mayford Knife is noted. Atrophy of the left hand is also noted.       Assessment & Plan:  Peripheral neuropathy. Hyperlipidemia. Alcohol abuse. Routine blood screening. Also discussed need for him to cut out alcohol entirely.

## 2011-07-11 LAB — VITAMIN B12: Vitamin B-12: 756 pg/mL (ref 211–911)

## 2011-07-11 LAB — FOLATE: Folate: 3.9 ng/mL

## 2011-07-15 ENCOUNTER — Telehealth: Payer: Self-pay

## 2011-07-15 NOTE — Telephone Encounter (Signed)
Called pt to inform him of labs and apologized for them being late

## 2011-07-21 ENCOUNTER — Telehealth: Payer: Self-pay | Admitting: Cardiology

## 2011-07-21 NOTE — Telephone Encounter (Signed)
Pt asked for Copy of Stress Test, he will pick up next week/sign Release 07/21/11/km

## 2011-08-07 ENCOUNTER — Telehealth: Payer: Self-pay

## 2011-08-07 NOTE — Telephone Encounter (Signed)
appt Monday  For surgery clearance

## 2011-08-11 ENCOUNTER — Encounter: Payer: Self-pay | Admitting: Family Medicine

## 2011-08-11 ENCOUNTER — Ambulatory Visit (INDEPENDENT_AMBULATORY_CARE_PROVIDER_SITE_OTHER): Payer: BC Managed Care – PPO | Admitting: Family Medicine

## 2011-08-11 DIAGNOSIS — F1020 Alcohol dependence, uncomplicated: Secondary | ICD-10-CM

## 2011-08-11 DIAGNOSIS — F17200 Nicotine dependence, unspecified, uncomplicated: Secondary | ICD-10-CM | POA: Insufficient documentation

## 2011-08-11 DIAGNOSIS — Z720 Tobacco use: Secondary | ICD-10-CM | POA: Insufficient documentation

## 2011-08-11 DIAGNOSIS — J301 Allergic rhinitis due to pollen: Secondary | ICD-10-CM | POA: Insufficient documentation

## 2011-08-11 DIAGNOSIS — J45909 Unspecified asthma, uncomplicated: Secondary | ICD-10-CM

## 2011-08-11 MED ORDER — FLUTICASONE-SALMETEROL 500-50 MCG/DOSE IN AEPB: 1.0000 | INHALATION_SPRAY | Freq: Two times a day (BID) | RESPIRATORY_TRACT | Status: DC

## 2011-08-11 NOTE — Patient Instructions (Signed)
Use the new strength of Advair and use the Proventil on an as-needed basis. Return here in one month

## 2011-08-11 NOTE — Progress Notes (Signed)
  Subjective:    Patient ID: Gregory [name], male    DOB: September 09, 1951, 60 y.o.   MRN: [id number]  HPI He is here for a followup visit. He is getting ready to have surgery. He was seen by cardiology and apparently cleared by them. He does use Advair and Proventil but is using Proventil on a daily basis stating that he was told to do so at an urgent care Center. He does plan on quitting smoking on Monday apparently when his wife will be starting a new immune modulating medication. He also plans to quit smoking. He has a previous history of going to AA and recognizes the need to do that. He apparently is on a medication given to him by Dr. Evelene Croon to help with this. His allergies seem to be under good control.   Review of Systems     Objective:   Physical Exam alert and in no distress. Tympanic membranes and canals are normal. Throat is clear. Tonsils are normal. Neck is supple without adenopathy or thyromegaly. Cardiac exam shows a regular sinus rhythm without murmurs or gallops. Lungs show scattered wheezing       Assessment & Plan:  Asthma. Allergic rhinitis. Alcohol abuse. Cigarette abuse. He will be cleared for surgery. I will increase his Advair to 500/50 twice a day. He is to use Proventil as needed. Strongly encouraged him to go to AA. He will also let me know if he has difficulty with quitting smoking. Return here in one month for recheck on his asthma.

## 2011-08-13 ENCOUNTER — Telehealth: Payer: Self-pay | Admitting: *Deleted

## 2011-08-13 NOTE — Telephone Encounter (Signed)
Lm that Dr. Swaziland cleared him  for surgery w/Dr. Amanda Pea; will fax clearance form to [facility] at [phone]

## 2011-08-20 ENCOUNTER — Telehealth: Payer: Self-pay | Admitting: Medical

## 2011-08-20 NOTE — Telephone Encounter (Signed)
Faxed OV, EKG & Stress to Tanya at the [facility] ([phone]).

## 2011-08-26 ENCOUNTER — Ambulatory Visit: Payer: BC Managed Care – PPO | Admitting: Cardiology

## 2011-08-28 ENCOUNTER — Ambulatory Visit (INDEPENDENT_AMBULATORY_CARE_PROVIDER_SITE_OTHER): Payer: BC Managed Care – PPO | Admitting: Surgery

## 2011-08-28 ENCOUNTER — Encounter (INDEPENDENT_AMBULATORY_CARE_PROVIDER_SITE_OTHER): Payer: Self-pay | Admitting: Surgery

## 2011-08-28 VITALS — BP 116/68 | Temp 97.0°F | Ht 65.0 in | Wt 129.0 lb

## 2011-08-28 DIAGNOSIS — K603 Anal fistula, unspecified: Secondary | ICD-10-CM

## 2011-08-28 NOTE — Progress Notes (Signed)
Chief Complaint  Patient presents with  . Other    Eval anal abscess to discuss sx.    HPI Gregory [name] is a 60 y.o. male.   HPI The patient presents today. I saw him about a year ago for a fistula in ano rectal drainage. He was scheduled for exam under anesthesia with possible fistulotomy this was canceled due to medical issues. He is receiving medical clearance and is now ready for surgery. He is scheduled to see Dr. Butler Denmark for hand problems. He continues to have episodes of rectal drainage and pain. The episodes occur every 2 years. He has redness, pain and drainage from his rectum. He is asymptomatic currently.  Past Medical History  Diagnosis Date  . Allergy   . Hypertension   . Bipolar disorder   . Alcohol abuse   . Tobacco use disorder   . Sleep apnea   . Asthma   . Peripheral vascular disease   . Aortic stenosis   . Heart murmur   . Neuromuscular disorder   . Family history of skin cancer     Past Surgical History  Procedure Date  . Colonoscopy 2009    Family History  Problem Relation Age of Onset  . Stroke Father 93    Social History History  Substance Use Topics  . Smoking status: Current Everyday Smoker -- 1.0 packs/day    Types: Cigarettes  . Smokeless tobacco: Never Used  . Alcohol Use: 14.0 oz/week    28 drink(s) per week    Allergies  Allergen Reactions  . Codeine     REACTION: hives, throat and eyes swelled shut    Current Outpatient Prescriptions  Medication Sig Dispense Refill  . albuterol (PROVENTIL HFA;VENTOLIN HFA) 108 (90 BASE) MCG/ACT inhaler Inhale 2 puffs into the lungs every 6 (six) hours as needed for wheezing.  1 each  2  . ALBUTEROL SULFATE HFA IN Inhale into the lungs.        [name] amLODipine (NORVASC) 5 MG tablet Take 5 mg by mouth daily.        [name] atorvastatin (LIPITOR) 20 MG tablet Take 20 mg by mouth daily.        . divalproex (DEPAKOTE) 500 MG EC tablet Take 500 mg by mouth QID.        [name] Fluticasone-Salmeterol (ADVAIR  DISKUS) 500-50 MCG/DOSE AEPB Inhale 1 puff into the lungs 2 (two) times daily.  1 each  11  . lamoTRIgine (LAMICTAL) 200 MG tablet Take 200 mg by mouth daily.        [name] lisinopril-hydrochlorothiazide (PRINZIDE,ZESTORETIC) 20-12.5 MG per tablet TAKE ONE TABLET BY MOUTH ONE TIME DAILY  30 tablet  4  . naltrexone (DEPADE) 50 MG tablet Take 50 mg by mouth daily.        [name] pyridoxine (B-6) 200 MG tablet Take 200 mg by mouth daily.          Review of Systems Review of Systems  Constitutional: Negative.   HENT: Negative.   Eyes: Negative.   Respiratory: Negative.   Cardiovascular: Negative.   Gastrointestinal: Negative.   Genitourinary: Negative.   Musculoskeletal: Negative.   Neurological: Negative.   Hematological: Negative.   Psychiatric/Behavioral: Negative.     Blood pressure 116/68, temperature 97 F (36.1 C), temperature source Temporal, height 5\' 5"  (1.651 m), weight 129 lb (58.514 kg).  Physical Exam Physical Exam  Constitutional: He appears well-developed and well-nourished.  HENT:  Head: Normocephalic and atraumatic.  Nose: Nose normal.  Eyes: Conjunctivae  and EOM are normal. Pupils are equal, round, and reactive to light.  Cardiovascular: Normal rate and regular rhythm.   Abdominal: Soft. Bowel sounds are normal.       Rectal exam is normal.  Skin: Skin is warm and dry.  Psychiatric: He has a normal mood and affect. His behavior is normal. Judgment and thought content normal.    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 He may also require a fistula plug. Risk of bleeding, infection, incontinence and recurrence of fistula and or rectal problem discussed. Cardiovascular risks, pulmonary risks and anesthesia risks discussed. Alternative therapies discussed. He agrees to proceed.       Priest Lockridge A. 08/28/2011, 3:03  PM

## 2011-08-28 NOTE — Patient Instructions (Signed)
You will be scheduled for surgery.

## 2011-08-29 ENCOUNTER — Telehealth: Payer: Self-pay | Admitting: Family Medicine

## 2011-08-29 NOTE — Telephone Encounter (Signed)
Called pt left message pt needs appt

## 2011-08-29 NOTE — Telephone Encounter (Signed)
Pt sates he went to [facility] and met with the anesthesiologist. She stated he was a little asthmatic and he used his inhaler and then his lungs sounded clear. They also did a pluse ox which was 98%.  They anesthesiologist told him to call his primary care doc and get a rx for prednisone. Pt uses [facility]. Please inform pt when rx is called in.

## 2011-08-29 NOTE — Telephone Encounter (Signed)
He needs to come in for an appointment to discuss his asthma

## 2011-09-11 ENCOUNTER — Ambulatory Visit: Payer: BC Managed Care – PPO | Admitting: Family Medicine

## 2011-09-18 ENCOUNTER — Encounter: Payer: Self-pay | Admitting: Cardiology

## 2011-09-25 HISTORY — PX: ANAL FISTULECTOMY: SHX1139

## 2011-09-26 ENCOUNTER — Encounter (HOSPITAL_BASED_OUTPATIENT_CLINIC_OR_DEPARTMENT_OTHER)
Admission: RE | Admit: 2011-09-26 | Discharge: 2011-09-26 | Disposition: A | Discharge status: Home / Self Care | Payer: BC Managed Care – PPO | Source: Ambulatory Visit | Attending: Surgery | Admitting: Surgery

## 2011-09-26 LAB — DIFFERENTIAL
Basophils Absolute: 0 10*3/uL (ref 0.0–0.1)
Basophils Relative: 0 % (ref 0–1)
Eosinophils Absolute: 0.1 10*3/uL (ref 0.0–0.7)
Eosinophils Relative: 2 % (ref 0–5)
Lymphocytes Relative: 26 % (ref 12–46)
Lymphs Abs: 1.8 10*3/uL (ref 0.7–4.0)
Monocytes Absolute: 0.7 10*3/uL (ref 0.1–1.0)
Monocytes Relative: 10 % (ref 3–12)
Neutro Abs: 4.2 10*3/uL (ref 1.7–7.7)
Neutrophils Relative %: 62 % (ref 43–77)

## 2011-09-26 LAB — COMPREHENSIVE METABOLIC PANEL
ALT: 28 U/L (ref 0–53)
AST: 34 U/L (ref 0–37)
Albumin: 3.6 g/dL (ref 3.5–5.2)
Alkaline Phosphatase: 65 U/L (ref 39–117)
BUN: 3 mg/dL — ABNORMAL LOW (ref 6–23)
CO2: 29 mEq/L (ref 19–32)
Calcium: 9.3 mg/dL (ref 8.4–10.5)
Chloride: 100 mEq/L (ref 96–112)
Creatinine, Ser: 0.55 mg/dL (ref 0.50–1.35)
GFR calc Af Amer: >90 mL/min (ref >90)
GFR calc non Af Amer: >90 mL/min (ref >90)
Glucose, Bld: 107 mg/dL — ABNORMAL HIGH (ref 70–99)
Potassium: 3.9 mEq/L (ref 3.5–5.1)
Sodium: 138 mEq/L (ref 135–145)
Total Bilirubin: 0.3 mg/dL (ref 0.3–1.2)
Total Protein: 7.3 g/dL (ref 6.0–8.3)

## 2011-09-26 LAB — CBC
HCT: 43.8 % (ref 39.0–52.0)
Hemoglobin: 15.5 g/dL (ref 13.0–17.0)
MCH: 34.2 pg — ABNORMAL HIGH (ref 26.0–34.0)
MCHC: 35.4 g/dL (ref 30.0–36.0)
MCV: 96.7 fL (ref 78.0–100.0)
Platelets: 179 10*3/uL (ref 150–400)
RBC: 4.53 MIL/uL (ref 4.22–5.81)
RDW: 13.6 % (ref 11.5–15.5)
WBC: 6.8 10*3/uL (ref 4.0–10.5)

## 2011-09-29 ENCOUNTER — Other Ambulatory Visit (INDEPENDENT_AMBULATORY_CARE_PROVIDER_SITE_OTHER): Payer: Self-pay | Admitting: Surgery

## 2011-09-29 ENCOUNTER — Ambulatory Visit (HOSPITAL_BASED_OUTPATIENT_CLINIC_OR_DEPARTMENT_OTHER)
Admission: RE | Admit: 2011-09-29 | Discharge: 2011-09-29 | Disposition: A | Discharge status: Home / Self Care | Payer: BC Managed Care – PPO | Source: Ambulatory Visit | Attending: Surgery | Admitting: Surgery

## 2011-09-29 DIAGNOSIS — K644 Residual hemorrhoidal skin tags: Secondary | ICD-10-CM | POA: Insufficient documentation

## 2011-09-29 DIAGNOSIS — F1010 Alcohol abuse, uncomplicated: Secondary | ICD-10-CM | POA: Insufficient documentation

## 2011-09-29 DIAGNOSIS — K648 Other hemorrhoids: Secondary | ICD-10-CM

## 2011-09-29 DIAGNOSIS — J45909 Unspecified asthma, uncomplicated: Secondary | ICD-10-CM | POA: Insufficient documentation

## 2011-09-29 DIAGNOSIS — Z0271 Encounter for disability determination: Secondary | ICD-10-CM

## 2011-09-29 DIAGNOSIS — I10 Essential (primary) hypertension: Secondary | ICD-10-CM | POA: Insufficient documentation

## 2011-09-29 DIAGNOSIS — I739 Peripheral vascular disease, unspecified: Secondary | ICD-10-CM | POA: Insufficient documentation

## 2011-09-29 DIAGNOSIS — G4733 Obstructive sleep apnea (adult) (pediatric): Secondary | ICD-10-CM | POA: Insufficient documentation

## 2011-09-29 DIAGNOSIS — K603 Anal fistula, unspecified: Secondary | ICD-10-CM | POA: Insufficient documentation

## 2011-09-29 DIAGNOSIS — F319 Bipolar disorder, unspecified: Secondary | ICD-10-CM | POA: Insufficient documentation

## 2011-09-29 DIAGNOSIS — Z01812 Encounter for preprocedural laboratory examination: Secondary | ICD-10-CM | POA: Insufficient documentation

## 2011-09-30 DIAGNOSIS — Z0271 Encounter for disability determination: Secondary | ICD-10-CM

## 2011-09-30 NOTE — Op Note (Signed)
  Gregory [name], Gregory [name]            ACCOUNT NO.:  [id number]  MEDICAL RECORD NO.:  [id number]  LOCATION:  [id number]                     FACILITY:  [facility]  PHYSICIAN:  Maisie Fus A. Desjuan Stearns, M.D.DATE OF BIRTH:  Feb 15, 1951  DATE OF PROCEDURE:  09/29/2011 DATE OF DISCHARGE:  07/01/2011                              OPERATIVE REPORT   PREOPERATIVE DIAGNOSIS:  History of fistula in ANO.  POSTOPERATIVE DIAGNOSES: 1. Fistula in ANO. 2. Grade 3 prolapsed right posterior column internal and external     hemorrhoids.  PROCEDURE: 1. Exam under anesthesia. 2. Fistulotomy. 3. Internal/external hemorrhoidectomy 1 column, right lateral     position.  SURGEON:  Maisie Fus A. Viyaan Champine, MD  ANESTHESIA:  0.25% Sensorcaine with epinephrine plus LMA general anesthesia.  ESTIMATED BLOOD LOSS:  Minimal.  SPECIMEN:  Hemorrhoid tissue to Pathology.  INDICATIONS FOR PROCEDURE:  The patient is a 60 year old male who has had persistent perirectal abscesses.  These came and go, but no obvious source has been found.  They are quite recurrent.  I recommended exam under anesthesia to further evaluate this.  Risk of bleeding, infection, need for further surgery, as well as not finding anything were discussed with the patient.  He understood the above and agreed to proceed.  DESCRIPTION OF PROCEDURE:  The patient was seen in the holding area. Questions were answered.  He was taken back to the operating room. After induction of general anesthesia, he was placed in lithotomy. Perianal region was prepped and draped in sterile fashion and time-out was done.  He received Ancef.  Digital examination was normal.  He had a small sinus tract in the right lateral perianal skin.  I probed this and found a very superficial fistula tract extending back to the clips.  I went ahead and used cautery and opened this and cauterized this.  This was outside of the internal sphincter.  This was though tracking to a very large grade  3 prolapsed hemorrhoid complex.  I felt that this would need to be removed from the standpoint of being able to clean the area. Harmonic scalpel was used in the right posterior column, hemorrhoid complex was removed carefully with Harmonic Scalpel.  The sphincter was preserved.  The wound was irrigated.  It was clean.  I used 3-0 chromic to secure the mucosa with the bottom portion opened for drainage.  We injected the area with 0.25% Sensorcaine.  The remainder of his examination was normal.  A small piece of packing of Surgicel and Gelfoam was placed in the anal canal and dry dressing in mesh panties were placed.  He was taken out of lithotomy, extubated, taken to recovery room in a satisfactory condition.  All final counts were found to be correct.     Larenda Reedy A. Misha Antonini, M.D.     TAC/MEDQ  D:  09/29/2011  T:  09/29/2011  Job:  [id number]  cc:   [name], M.D.  Electronically Signed by Harriette Bouillon M.D. on 09/30/2011 07:53:22 AM

## 2011-10-02 ENCOUNTER — Ambulatory Visit: Payer: BC Managed Care – PPO | Admitting: Cardiology

## 2011-10-03 ENCOUNTER — Telehealth (INDEPENDENT_AMBULATORY_CARE_PROVIDER_SITE_OTHER): Payer: Self-pay

## 2011-10-03 NOTE — Telephone Encounter (Signed)
Pt called complaining of soreness and swelling.  He is having some mucous discharge from his rectum and a little bit of stool on the pad.  He started having some shakiness and fatigue Wednesday am.  He feels like he could be getting a cold but has no fever.  He is doing sitz baths 3 times a day.  He has pressure to urinate and tries 3 times but when he soaks in the sitz he is able to void.  I spoke to Dr Luisa Hart who said this is all expected and could last off and on 2-3 weeks.  When the rectal swelling gets better it should be easier for him to urinate.   I notified the patient and gave him a postop appointment.

## 2011-10-06 ENCOUNTER — Ambulatory Visit: Payer: BC Managed Care – PPO | Admitting: Family Medicine

## 2011-10-17 ENCOUNTER — Ambulatory Visit (INDEPENDENT_AMBULATORY_CARE_PROVIDER_SITE_OTHER): Payer: BC Managed Care – PPO | Admitting: General Surgery

## 2011-10-17 ENCOUNTER — Encounter (INDEPENDENT_AMBULATORY_CARE_PROVIDER_SITE_OTHER): Payer: Self-pay | Admitting: General Surgery

## 2011-10-17 VITALS — BP 142/72 | HR 58 | Temp 96.4°F | Resp 20 | Ht 65.5 in | Wt 132.2 lb

## 2011-10-17 DIAGNOSIS — Z5189 Encounter for other specified aftercare: Secondary | ICD-10-CM

## 2011-10-17 DIAGNOSIS — Z4889 Encounter for other specified surgical aftercare: Secondary | ICD-10-CM

## 2011-10-17 NOTE — Progress Notes (Signed)
Subjective:     Patient ID: Gregory [name], male   DOB: 05-06-1951, 60 y.o.   MRN: [id number]  HPI This patient is 2 weeks status post anal fistulotomy and hemorrhoidectomy by Dr. Luisa Hart but the OR note is not available today. He comes in today complaining of diarrhea and incontinence which requires him to wear diapers. He moves his bowels approximately 2-3 times per day and has had some associated chills but denies any fevers. He states that he has some clear mucus drainage of the diaper and finds it difficult to contain his bowels. This was worse initially but had began to prove yesterday. He states that today is the first day that he hasn't had a change his diaper more than once.  Review of Systems     Objective:   Physical Exam No acute distress and nontoxic-appearing  The anal fistulotomy tract has healthy-appearing granulation tissue without evidence of infection he does have some fibrinous exudate and some mucus drainage but no evidence of recurrent fistula or infection.    Assessment:     Status post anal fistulotomy and hemorrhoidectomy  I think the wound is healing appropriately and there is no evidence of infection. With regard to incontinence, it is difficult to comment on this because I do not have the operative report available and I cannot assess the depth of the fistulotomy. It appears superficial and I suspect that this is not true fecal incontinence but is likely noticing some mucus drainage from the granulation tissue from the fistulotomy tract.    Plan:     I recommended that he increase his fiber and water intake and keep his followup appointment with Dr. Mignon Pine his symptoms should continue to improve.

## 2011-10-24 ENCOUNTER — Telehealth: Payer: Self-pay | Admitting: Family Medicine

## 2011-10-24 ENCOUNTER — Encounter (INDEPENDENT_AMBULATORY_CARE_PROVIDER_SITE_OTHER): Payer: Self-pay | Admitting: Surgery

## 2011-10-24 ENCOUNTER — Ambulatory Visit (INDEPENDENT_AMBULATORY_CARE_PROVIDER_SITE_OTHER): Payer: BC Managed Care – PPO | Admitting: Surgery

## 2011-10-24 VITALS — BP 132/76 | HR 68 | Temp 96.8°F | Resp 16 | Ht 65.0 in | Wt 133.4 lb

## 2011-10-24 DIAGNOSIS — Z9889 Other specified postprocedural states: Secondary | ICD-10-CM

## 2011-10-24 NOTE — Telephone Encounter (Signed)
If he has already had a pneumonia shot, he will not need another one for several more years

## 2011-10-24 NOTE — Patient Instructions (Signed)
Follow up 1 month.  Continue present care.

## 2011-10-24 NOTE — Progress Notes (Signed)
The patient returns today. He was seen 2 weeks ago the office due to loose stool incontinence after a fistulotomy and hemorrhoidectomy. Those issues have resolved. He is feeling better.  Exam: No canal healing well. No signs of infection. Scant drainage. No bleeding  Impression: Status post fistulotomy and hemorrhoidectomy  Plan: Return to clinic one month. Continue present care.

## 2011-10-24 NOTE — Telephone Encounter (Signed)
He thinks he has one back in 2003 and was just wondering when he should get another one. He is 60 years of age and has had a flu shot recently

## 2011-10-29 ENCOUNTER — Other Ambulatory Visit: Payer: Self-pay | Admitting: *Deleted

## 2011-10-29 MED ORDER — ATORVASTATIN CALCIUM 20 MG PO TABS: 20.0000 mg | ORAL_TABLET | Freq: Every day | ORAL | Status: DC

## 2011-11-03 NOTE — Telephone Encounter (Signed)
11/03/2011 °

## 2011-11-24 ENCOUNTER — Encounter (INDEPENDENT_AMBULATORY_CARE_PROVIDER_SITE_OTHER): Payer: Self-pay | Admitting: Surgery

## 2011-11-24 ENCOUNTER — Ambulatory Visit (INDEPENDENT_AMBULATORY_CARE_PROVIDER_SITE_OTHER): Payer: BC Managed Care – PPO | Admitting: Surgery

## 2011-11-24 VITALS — BP 142/90 | HR 76 | Temp 97.6°F | Resp 20 | Ht 65.5 in | Wt 135.2 lb

## 2011-11-24 DIAGNOSIS — Z9889 Other specified postprocedural states: Secondary | ICD-10-CM

## 2011-11-24 NOTE — Patient Instructions (Signed)
Follow up as needed

## 2011-11-24 NOTE — Progress Notes (Signed)
The patient returns today. He was seen 2 weeks ago the office due to loose stool incontinence after a fistulotomy and hemorrhoidectomy. Those issues have resolved. He is feeling better.  Exam: No canal healing well. No signs of infection. Scant drainage. No bleeding  Impression: Status post fistulotomy and hemorrhoidectomy  Plan:Follow up as needed.

## 2011-12-12 ENCOUNTER — Other Ambulatory Visit: Payer: Self-pay | Admitting: Family Medicine

## 2011-12-15 ENCOUNTER — Other Ambulatory Visit: Payer: Self-pay | Admitting: Family Medicine

## 2012-08-24 ENCOUNTER — Ambulatory Visit (INDEPENDENT_AMBULATORY_CARE_PROVIDER_SITE_OTHER): Payer: MEDICARE | Admitting: Family Medicine

## 2012-08-24 VITALS — BP 180/88 | HR 81 | Temp 98.7°F | Resp 18 | Ht 66.0 in | Wt 131.0 lb

## 2012-08-24 DIAGNOSIS — J329 Chronic sinusitis, unspecified: Secondary | ICD-10-CM

## 2012-08-24 DIAGNOSIS — I10 Essential (primary) hypertension: Secondary | ICD-10-CM

## 2012-08-24 MED ORDER — AMOXICILLIN-POT CLAVULANATE 875-125 MG PO TABS: 1.0000 | ORAL_TABLET | Freq: Two times a day (BID) | ORAL | Status: AC

## 2012-08-24 NOTE — Progress Notes (Signed)
[facility] [address] [place] [address] [phone]  Date:  08/24/2012   Name:  Gregory [name]   DOB:  1951/10/19   MRN:  [id number]  PCP:  Carollee Herter, MD    Chief Complaint: Sinusitis   History of Present Illness:  Gregory [name] is a 61 y.o. very pleasant male patient who presents with the following:  He is here with recurrent sinus symptoms.  Started again 2 weeks ago with URI type symptoms.  He thought he was getting better, but over the last several days he has noted sinus pain and pressure, and tooth/ gum pain.  His eyes are painful/ pressure. No fever, poor appetite.   Coughing has now resolved.  He has noted chills but no aches.   He has tried sudafed- he last took this a few days ago- it made him feel too shaky.  He has been off his HTN meds for about 5 days.   He has not been using his inhalers- he knows that he needs to get back on these.  He has not been taking any of his routine medications since he fell ill.    Patient Active Problem List  Diagnosis  . Asthma  . Active smoker  . Alcoholic  . Allergic rhinitis due to pollen    Past Medical History  Diagnosis Date  . Allergy   . Hypertension   . Bipolar disorder   . Alcohol abuse   . Tobacco use disorder   . Sleep apnea   . Asthma   . Peripheral vascular disease   . Aortic stenosis   . Heart murmur   . Neuromuscular disorder   . Family history of skin cancer   . Hyperlipidemia   . Coronary artery disease   . Carotid artery occlusion   . PAD (peripheral artery disease)   . Claudication     Past Surgical History  Procedure Date  . Colonoscopy 2009  . Vasectomy reversal   . US echocardiography 06/20/2010    EF 55-60%  . Anal fistulectomy 09/25/11    History  Substance Use Topics  . Smoking status: Current Everyday Smoker -- 1.0 packs/day    Types: Cigarettes  . Smokeless tobacco: Never Used  . Alcohol Use: 14.0 oz/week    28 drink(s) per week     Family History  Problem Relation Age of Onset  . Stroke Father 20  . Cancer Father     skin  . Hypertension Brother   . Diabetes Brother   . Cancer Maternal Aunt     skin  . Cancer Maternal Grandmother     liver    Allergies  Allergen Reactions  . Codeine     REACTION: hives, throat and eyes swelled shut    Medication list has been reviewed and updated.  Current Outpatient Prescriptions on File Prior to Visit  Medication Sig Dispense Refill  . ALBUTEROL SULFATE HFA IN Inhale into the lungs.        [name] amLODipine (NORVASC) 5 MG tablet TAKE ONE TABLET BY MOUTH ONE TIME DAILY  30 tablet  PRN  . atorvastatin (LIPITOR) 20 MG tablet Take 1 tablet (20 mg total) by mouth daily.  30 tablet  5  . divalproex (DEPAKOTE) 500 MG EC tablet Take 500 mg by mouth QID.        [name] lamoTRIgine (LAMICTAL) 200 MG tablet Take 200 mg by mouth daily.        [name] lisinopril-hydrochlorothiazide (PRINZIDE,ZESTORETIC)  20-12.5 MG per tablet TAKE ONE TABLET BY MOUTH ONE TIME DAILY  30 tablet  3  . albuterol (PROVENTIL HFA;VENTOLIN HFA) 108 (90 BASE) MCG/ACT inhaler Inhale 2 puffs into the lungs every 6 (six) hours as needed for wheezing.  1 each  2  . Ascorbic Acid (VITAMIN C PO) Take by mouth daily.        . Cyanocobalamin (VITAMIN B12 PO) Take by mouth daily.        . Fluticasone-Salmeterol (ADVAIR DISKUS) 500-50 MCG/DOSE AEPB Inhale 1 puff into the lungs 2 (two) times daily.  1 each  11  . Multiple Vitamin (MULTIVITAMIN PO) Take by mouth daily.        . naltrexone (DEPADE) 50 MG tablet Take 50 mg by mouth daily.        [name] pyridoxine (B-6) 200 MG tablet Take 200 mg by mouth daily.          Review of Systems:  As per HPI- otherwise negative.   Physical Examination: Filed Vitals:   08/24/12 1317  BP: 180/88  Pulse: 81  Temp: 98.7 F (37.1 C)  Resp: 18   Filed Vitals:   08/24/12 1317  Height: 5\' 6"  (1.676 m)  Weight: 131 lb (59.421 kg)   Body mass index is 21.14 kg/(m^2). Ideal Body Weight:  Weight in (lb) to have BMI = 25: 154.6   GEN: WDWN, NAD, Non-toxic, A & O x 3- appearance of chronic tobacco abuse and possible alcohol abuse HEENT: Atraumatic, Normocephalic. Neck supple. No masses, No LAD.  PEERL, EOMI- no pain with eye movement.  Oropharynx wnl, TM wnl, sinuses tender.   Ears and Nose: No external deformity. CV: RRR, No M/G/R. No JVD. No thrill. No extra heart sounds. PULM: no crackles, rhonchi. No retractions. No resp. distress. No accessory muscle use.  Diffuse wheezes bilaterally EXTR: No c/c/e NEURO Normal gait.  PSYCH: Normally interactive. Conversant. Not depressed or anxious appearing.  Calm demeanor.    Assessment and Plan: 1. Sinusitis  amoxicillin-clavulanate (AUGMENTIN) 875-125 MG per tablet  2. HTN (hypertension)     Sinusitis and bronchitis- rx augmentin as above.  Restart home medications- he knows that he needs to continue his medications but just did not feel like taking them.  May use OTC delsym or mucinex as needed.  Patient (or parent if minor) instructed to return to clinic or call if not better in 2-3 day(s).   Abbe Amsterdam, MD

## 2012-09-01 ENCOUNTER — Telehealth: Payer: Self-pay

## 2012-09-01 NOTE — Telephone Encounter (Signed)
Pt is not feeling better with Augmentin prescribed last week.  Please call.  [phone] Christiane Ha

## 2012-09-01 NOTE — Telephone Encounter (Signed)
Left message for him to call me back about this, is he taking the mucinex? Is he worse, or just no better?

## 2012-09-02 ENCOUNTER — Telehealth: Payer: Self-pay

## 2012-09-02 MED ORDER — FLUTICASONE PROPIONATE 50 MCG/ACT NA SUSP: 2.0000 | Freq: Every day | NASAL | Status: DC

## 2012-09-02 NOTE — Telephone Encounter (Signed)
Has been taking Augmentin with little relief. Has not taken Mucinex was advised to go back on the Mucinex. Please advise, patient fatigued, ears stopped up and is congested (nasal) he uses [facility] [address]. Wants to know how to proceed from here.

## 2012-09-02 NOTE — Telephone Encounter (Signed)
Left another message for call back

## 2012-09-02 NOTE — Telephone Encounter (Signed)
Called patient to advise  °

## 2012-09-02 NOTE — Telephone Encounter (Signed)
Patient returned Amy's Call   Please call [phone]

## 2012-09-02 NOTE — Telephone Encounter (Signed)
Pt should be close to finishing the augmentin so now I think that if most of his facial pain and pressure is resolved and he is just still congested we need to work on that.  So mucinex, flonase can be sent into pharmacy to help with his congestion and and ears.

## 2012-09-03 NOTE — Telephone Encounter (Signed)
Pt states he has been playing phone tag with [place] over the past couple of days and is returning call to amy again. Asks for call back.   Best: [phone]  bf

## 2012-09-05 ENCOUNTER — Emergency Department (HOSPITAL_COMMUNITY)
Admission: EM | Admit: 2012-09-05 | Discharge: 2012-09-05 | Disposition: A | Discharge status: Home / Self Care | Payer: Medicare Other | Attending: Emergency Medicine | Admitting: Emergency Medicine

## 2012-09-05 ENCOUNTER — Encounter (HOSPITAL_COMMUNITY): Payer: Self-pay | Admitting: Emergency Medicine

## 2012-09-05 ENCOUNTER — Emergency Department (HOSPITAL_COMMUNITY): Payer: Medicare Other

## 2012-09-05 DIAGNOSIS — R42 Dizziness and giddiness: Secondary | ICD-10-CM | POA: Insufficient documentation

## 2012-09-05 DIAGNOSIS — Z91199 Patient's noncompliance with other medical treatment and regimen due to unspecified reason: Secondary | ICD-10-CM | POA: Insufficient documentation

## 2012-09-05 DIAGNOSIS — Z9119 Patient's noncompliance with other medical treatment and regimen: Secondary | ICD-10-CM | POA: Insufficient documentation

## 2012-09-05 DIAGNOSIS — I2510 Atherosclerotic heart disease of native coronary artery without angina pectoris: Secondary | ICD-10-CM | POA: Insufficient documentation

## 2012-09-05 DIAGNOSIS — I10 Essential (primary) hypertension: Secondary | ICD-10-CM | POA: Insufficient documentation

## 2012-09-05 DIAGNOSIS — F17200 Nicotine dependence, unspecified, uncomplicated: Secondary | ICD-10-CM | POA: Insufficient documentation

## 2012-09-05 DIAGNOSIS — N390 Urinary tract infection, site not specified: Secondary | ICD-10-CM

## 2012-09-05 DIAGNOSIS — J45909 Unspecified asthma, uncomplicated: Secondary | ICD-10-CM | POA: Insufficient documentation

## 2012-09-05 DIAGNOSIS — R062 Wheezing: Secondary | ICD-10-CM

## 2012-09-05 DIAGNOSIS — F319 Bipolar disorder, unspecified: Secondary | ICD-10-CM | POA: Insufficient documentation

## 2012-09-05 DIAGNOSIS — Z9114 Patient's other noncompliance with medication regimen: Secondary | ICD-10-CM

## 2012-09-05 LAB — BASIC METABOLIC PANEL
BUN: 6 mg/dL (ref 6–23)
CO2: 25 mEq/L (ref 19–32)
Calcium: 8.3 mg/dL — ABNORMAL LOW (ref 8.4–10.5)
Chloride: 98 mEq/L (ref 96–112)
Creatinine, Ser: 0.54 mg/dL (ref 0.50–1.35)
GFR calc Af Amer: >90 mL/min (ref >90)
GFR calc non Af Amer: >90 mL/min (ref >90)
Glucose, Bld: 96 mg/dL (ref 70–99)
Potassium: 4 mEq/L (ref 3.5–5.1)
Sodium: 139 mEq/L (ref 135–145)

## 2012-09-05 LAB — CBC WITH DIFFERENTIAL/PLATELET
Basophils Absolute: 0 10*3/uL (ref 0.0–0.1)
Basophils Relative: 0 % (ref 0–1)
Eosinophils Absolute: 0 10*3/uL (ref 0.0–0.7)
Eosinophils Relative: 1 % (ref 0–5)
HCT: 45.4 % (ref 39.0–52.0)
Hemoglobin: 16.1 g/dL (ref 13.0–17.0)
Lymphocytes Relative: 33 % (ref 12–46)
Lymphs Abs: 2.1 10*3/uL (ref 0.7–4.0)
MCH: 34.5 pg — ABNORMAL HIGH (ref 26.0–34.0)
MCHC: 35.5 g/dL (ref 30.0–36.0)
MCV: 97.4 fL (ref 78.0–100.0)
Monocytes Absolute: 0.8 10*3/uL (ref 0.1–1.0)
Monocytes Relative: 12 % (ref 3–12)
Neutro Abs: 3.4 10*3/uL (ref 1.7–7.7)
Neutrophils Relative %: 55 % (ref 43–77)
Platelets: 183 10*3/uL (ref 150–400)
RBC: 4.66 MIL/uL (ref 4.22–5.81)
RDW: 15 % (ref 11.5–15.5)
WBC: 6.3 10*3/uL (ref 4.0–10.5)

## 2012-09-05 LAB — TROPONIN I: Troponin I: <0.3 ng/mL (ref <0.30)

## 2012-09-05 MED ORDER — ALBUTEROL SULFATE (5 MG/ML) 0.5% IN NEBU
5.0000 mg | INHALATION_SOLUTION | Freq: Once | RESPIRATORY_TRACT | Status: AC
  Administered 2012-09-05: 5 mg via RESPIRATORY_TRACT
  Filled 2012-09-05: qty 1

## 2012-09-05 MED ORDER — LISINOPRIL 20 MG PO TABS
20.0000 mg | ORAL_TABLET | Freq: Once | ORAL | Status: AC
  Administered 2012-09-05: 20 mg via ORAL
  Filled 2012-09-05: qty 1

## 2012-09-05 MED ORDER — HYDROCHLOROTHIAZIDE 12.5 MG PO CAPS
12.5000 mg | ORAL_CAPSULE | Freq: Once | ORAL | Status: AC
  Administered 2012-09-05: 12.5 mg via ORAL
  Filled 2012-09-05: qty 1

## 2012-09-05 MED ORDER — IPRATROPIUM BROMIDE 0.02 % IN SOLN
0.5000 mg | Freq: Once | RESPIRATORY_TRACT | Status: AC
  Administered 2012-09-05: 0.5 mg via RESPIRATORY_TRACT
  Filled 2012-09-05: qty 2.5

## 2012-09-05 NOTE — ED Provider Notes (Signed)
History     CSN: [id number]  Arrival date & time 09/05/12  0030   First MD Initiated Contact with Patient 09/05/12 0239      Chief Complaint  Patient presents with  . Respiratory Distress     The history is provided by the patient and the spouse.  pt reports continued URI symptoms with stuffiness in ears, cough and congestion despite tx with amox. Seen approx 10 days ago at urgent care. Has not finished abx, stopped b/c he didn't think it was helping. No fevers or chills. No CP. Hx of asthma. Complains of throat and ear pain and upper back pain that has been constant.  Noncompliant with all of his meds including his HTN meds. Hx of asthma. Some SOB. No orthopnea, no DOE  Past Medical History  Diagnosis Date  . Allergy   . Hypertension   . Bipolar disorder   . Alcohol abuse   . Tobacco use disorder   . Sleep apnea   . Asthma   . Peripheral vascular disease   . Aortic stenosis   . Heart murmur   . Neuromuscular disorder   . Family history of skin cancer   . Hyperlipidemia   . Coronary artery disease   . Carotid artery occlusion   . PAD (peripheral artery disease)   . Claudication     Past Surgical History  Procedure Date  . Colonoscopy 2009  . Vasectomy reversal   . US echocardiography 06/20/2010    EF 55-60%  . Anal fistulectomy 09/25/11    Family History  Problem Relation Age of Onset  . Stroke Father 64  . Cancer Father     skin  . Hypertension Brother   . Diabetes Brother   . Cancer Maternal Aunt     skin  . Cancer Maternal Grandmother     liver    History  Substance Use Topics  . Smoking status: Current Every Day Smoker -- 1.0 packs/day    Types: Cigarettes  . Smokeless tobacco: Never Used  . Alcohol Use: 14.0 oz/week    28 drink(s) per week      Review of Systems  All other systems reviewed and are negative.    Allergies  Codeine  Home Medications  No current outpatient prescriptions on file.  BP 143/86  Pulse 78  Temp 98.4 F  (36.9 C) (Oral)  Resp 16  SpO2 95%  Physical Exam  Nursing note and vitals reviewed. Constitutional: He is oriented to person, place, and time. He appears well-developed and well-nourished.  HENT:  Head: No trismus in the jaw.  Right Ear: Tympanic membrane, external ear and ear canal normal. No drainage, swelling or tenderness. Tympanic membrane is not bulging.  Left Ear: Hearing, tympanic membrane, external ear and ear canal normal. No drainage, swelling or tenderness. Tympanic membrane is not bulging.  Nose: Nose normal.  Mouth/Throat: Uvula is midline, oropharynx is clear and moist and mucous membranes are normal. No tonsillar abscesses.  Eyes: EOM are normal.  Neck: Normal range of motion and phonation normal. No tracheal deviation present.  Cardiovascular: Normal rate, regular rhythm, normal heart sounds and intact distal pulses.   Pulmonary/Chest: Effort normal and breath sounds normal. No stridor. No respiratory distress.  Abdominal: Soft. He exhibits no distension. There is no tenderness.  Musculoskeletal: Normal range of motion.  Neurological: He is alert and oriented to person, place, and time.  Skin: Skin is warm and dry.  Psychiatric: He has a normal mood and affect.  Judgment normal.    ED Course  Procedures (including critical care time)  Labs Reviewed  CBC WITH DIFFERENTIAL - Abnormal; Notable for the following:    MCH 34.5 (*)     All other components within normal limits  BASIC METABOLIC PANEL - Abnormal; Notable for the following:    Calcium 8.3 (*)     All other components within normal limits  TROPONIN I   Dg Chest 2 View  09/05/2012  *RADIOLOGY REPORT*  Clinical Data: Chest pain and dizziness.  CHEST - 2 VIEW  Comparison: 11/22/2010 and prior chest radiographs.  04/28/2011 CT.  Findings: The cardiomediastinal silhouette is unremarkable.  There is no evidence of focal airspace disease, pulmonary edema, suspicious pulmonary nodule/mass, pleural effusion, or  pneumothorax. No acute bony abnormalities are identified.  IMPRESSION: No evidence of active cardiopulmonary disease.   Original Report Authenticated By: Rosendo Gros, M.D.     I personally reviewed the imaging tests through PACS system  I reviewed available ER/hospitalization records thought the EMR   1. Urinary tract infection   2. Wheezing   3. Hypertension   4. H/O medication noncompliance       MDM  Medication noncompliance.  Feels much better after albuterol this time.  His symptoms are mainly upper respiratory in nature.  Chest x-ray is clear.  This is not ACS.  He has no signs of otitis media.  I recommended close PCP followup and compliance with his medications.  He has an albuterol inhaler at home.        Lyanne Co, MD 09/05/12 2233

## 2012-09-05 NOTE — ED Notes (Signed)
Wife concerned pt not taking his medication

## 2012-09-05 NOTE — ED Notes (Addendum)
Pt seen at [facility] 10 days ago for diff breathing, per wife did not take antx as directed. Now worse per wife. Audible wheezing noted, pt c/o upper back pain, throat and ear pain

## 2012-09-05 NOTE — ED Notes (Addendum)
Pt from home with c/o shob. Wife at bedside sts patient has not taken his augmentin as directed and his infection has gotten worse. Patient c/o shob, wheezing audible. Wife also sts that patient is not taking his other medications for htn, high cholesterol, seizures, and bipolar disorder. Pt educated on the importance of taking his medications due to the severity of his conditions.

## 2012-09-06 NOTE — Telephone Encounter (Signed)
Have spoken to him since this message to advise he knows to take the mucinex and flonase.

## 2012-09-22 ENCOUNTER — Ambulatory Visit (INDEPENDENT_AMBULATORY_CARE_PROVIDER_SITE_OTHER): Payer: Medicare Other | Admitting: Family Medicine

## 2012-09-22 ENCOUNTER — Telehealth: Payer: Self-pay | Admitting: *Deleted

## 2012-09-22 ENCOUNTER — Encounter: Payer: Self-pay | Admitting: Family Medicine

## 2012-09-22 VITALS — BP 140/80 | HR 72 | Temp 98.8°F | Ht 65.0 in | Wt 132.0 lb

## 2012-09-22 DIAGNOSIS — J309 Allergic rhinitis, unspecified: Secondary | ICD-10-CM

## 2012-09-22 DIAGNOSIS — F17200 Nicotine dependence, unspecified, uncomplicated: Secondary | ICD-10-CM

## 2012-09-22 DIAGNOSIS — J069 Acute upper respiratory infection, unspecified: Secondary | ICD-10-CM

## 2012-09-22 DIAGNOSIS — J45909 Unspecified asthma, uncomplicated: Secondary | ICD-10-CM

## 2012-09-22 NOTE — Telephone Encounter (Signed)
Patient needs refills on lisinopril 20/12.5, atorvastatin 20mg  and amlodipine 5mg  sent to [facility] [address].

## 2012-09-22 NOTE — Patient Instructions (Signed)
URI with laryngitis.  Likely viral, with allergic component. Restart zyrtec.  Avoid decongestants due to history of hypertension.  Restart Neti-pot or sinus rinses. Continue guaifenesin.  Return for re-evaluation if you develop fevers, worsening symptoms, worsening shortness of breath, persistent hoarseness, or other concerns.  This is likely viral, which is highly contagious.  Make sure to wash hands frequently, and cover mouth and nose. If you still have ongoing sinus and ear congestion around the time of your plane trip, then use afrin nasal spray prior to flight to avoid ear pain.  Please QUIT SMOKING!!!

## 2012-09-22 NOTE — Progress Notes (Signed)
Chief Complaint  Patient presents with  . Cough    about a month seen at Urgent Care. Has lost his voice since Monday night. Please see note that patient brought with.   HPI: Computer record reviewed--it appears that he was also seen in ER on 9/15 for URI symptoms.  S/p treatment by amox/clav from urgent care prior to ER visit (rx'd 9/3, never finished course).  CXR was normal.  He felt better after nebulizer treatment.  His BP was high while in ER, treated, and was sent home, being told illness was viral.   He started to get better, but "never really went out of his ears or sinuses".  Last week, symptoms started "roaring back", and he lost his voice yesterday.  Ongoing sinus pressure in forehead, and upper jaw, postnasal drip, sore throat.  Has had persistent ear pain from prior illness. Denies any fevers.  He is "gagging up mucus" which is clear.  Last night he through up his medications, but no other vomiting.    He has been using robitussin (stopped taking the Mucinex when he started feeling better).  Using his albuterol 3-4 times/day (baseline is BID), but getting good results from it.  Eyes are red and burning.  Tried zyrtec last month, didn't seem to help.  Having some sniffling, sneezing, itchy nose, runny nose (clear).    Denies any sick contacts.  He is concerned about planned visit with 64 month old babies (who were premature), and upcoming plane travel.  Past Medical History  Diagnosis Date  . Allergy   . Hypertension   . Bipolar disorder   . Alcohol abuse   . Tobacco use disorder   . Sleep apnea   . Asthma   . Peripheral vascular disease   . Aortic stenosis   . Heart murmur   . Neuromuscular disorder   . Family history of skin cancer   . Hyperlipidemia   . Coronary artery disease   . Carotid artery occlusion   . PAD (peripheral artery disease)   . Claudication    Past Surgical History  Procedure Date  . Colonoscopy 2009  . Vasectomy reversal   . US echocardiography  06/20/2010    EF 55-60%  . Anal fistulectomy 09/25/11     Current outpatient prescriptions:ALBUTEROL IN, Inhale 1-2 puffs into the lungs as needed., Disp: , Rfl: ;  amLODipine (NORVASC) 5 MG tablet, Take 5 mg by mouth daily., Disp: , Rfl: ;  atorvastatin (LIPITOR) 20 MG tablet, Take 20 mg by mouth daily., Disp: , Rfl: ;  divalproex (DEPAKOTE) 500 MG DR tablet, Take 2,000 mg by mouth at bedtime., Disp: , Rfl: ;  fluticasone (FLONASE) 50 MCG/ACT nasal spray, Place 2 sprays into the nose daily., Disp: , Rfl:  Fluticasone-Salmeterol (ADVAIR DISKUS) 500-50 MCG/DOSE AEPB, Inhale 1 puff into the lungs every 12 (twelve) hours., Disp: , Rfl: ;  lamoTRIgine (LAMICTAL) 200 MG tablet, Take 200 mg by mouth every morning., Disp: , Rfl: ;  lisinopril-hydrochlorothiazide (PRINZIDE,ZESTORETIC) 20-12.5 MG per tablet, Take 1 tablet by mouth daily., Disp: , Rfl:   Allergies  Allergen Reactions  . Codeine Anaphylaxis   ROS:  See HPI.  Denies fevers, purulent drainage, nausea, +gagging from PND. Denies diarrhea, skin rash, dizziness or other complaints, except as noted in HPI.  PHYSICAL EXAM: BP 140/80  Pulse 72  Temp 98.8 F (37.1 C) (Oral)  Ht 5\' 5"  (1.651 m)  Wt 132 lb (59.875 kg)  BMI 21.97 kg/m2 Pleasant male, with hoarse  voice, otherwise in no acute distress.  Speaking easily in full sentences, no coughing. HEENT:  PERRL, EOMI. TM's and EAC's normal. Conjunctiva clear Nasal mucosa mildly edematous with clear mucus Sinuses nontender OP clear, mucus membranes moist. Neck: no lymphadenopathy Heart: regular rate and rhythm without murmur Lungs: clear bilaterally with good air movement Hoarse voice Skin: no rash  ASSESSMENT/PLAN:  1. URI (upper respiratory infection)   2. Active smoker   3. Asthma   4. Allergic rhinitis, cause unspecified    URI with laryngitis.  Likely viral, with allergic component. Restart zyrtec.  Avoid decongestants due to history of hypertension.  Restart Neti-pot or sinus  rinses. Continue guaifenesin.  Continue OTC eye drops prn  Return for re-evaluation if you develop fevers, worsening symptoms, worsening shortness of breath, persistent hoarseness, or other concerns.  This is likely viral, which is highly contagious.  Make sure to wash hands frequently, and cover mouth and nose. If you still have ongoing sinus and ear congestion around the time of your plane trip, then use afrin nasal spray prior to flight to avoid ear pain.  Encouraged to quit smoking.

## 2012-09-23 ENCOUNTER — Other Ambulatory Visit: Payer: Self-pay | Admitting: *Deleted

## 2012-09-23 MED ORDER — AMLODIPINE BESYLATE 5 MG PO TABS: 5.0000 mg | ORAL_TABLET | Freq: Every day | ORAL | Status: DC

## 2012-09-23 MED ORDER — ATORVASTATIN CALCIUM 20 MG PO TABS: 20.0000 mg | ORAL_TABLET | Freq: Every day | ORAL | Status: DC

## 2012-09-23 MED ORDER — LISINOPRIL-HYDROCHLOROTHIAZIDE 20-12.5 MG PO TABS: 1.0000 | ORAL_TABLET | Freq: Every day | ORAL | Status: DC

## 2012-09-28 ENCOUNTER — Other Ambulatory Visit (INDEPENDENT_AMBULATORY_CARE_PROVIDER_SITE_OTHER): Payer: Medicare Other

## 2012-09-28 DIAGNOSIS — Z23 Encounter for immunization: Secondary | ICD-10-CM

## 2012-10-11 ENCOUNTER — Telehealth: Payer: Self-pay | Admitting: Family Medicine

## 2012-10-14 NOTE — Telephone Encounter (Signed)
TSD  

## 2012-10-28 ENCOUNTER — Encounter: Payer: Self-pay | Admitting: Family Medicine

## 2012-10-28 ENCOUNTER — Ambulatory Visit (INDEPENDENT_AMBULATORY_CARE_PROVIDER_SITE_OTHER): Payer: Medicare Other | Admitting: Family Medicine

## 2012-10-28 VITALS — BP 180/108 | HR 76 | Temp 98.6°F | Ht 65.0 in | Wt 133.0 lb

## 2012-10-28 DIAGNOSIS — Z23 Encounter for immunization: Secondary | ICD-10-CM

## 2012-10-28 DIAGNOSIS — T23229A Burn of second degree of unspecified single finger (nail) except thumb, initial encounter: Secondary | ICD-10-CM

## 2012-10-28 MED ORDER — SILVER SULFADIAZINE 1 % EX CREA: TOPICAL_CREAM | Freq: Two times a day (BID) | CUTANEOUS | Status: DC
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
## 2012-10-28 NOTE — Patient Instructions (Signed)
Burn Care  Your skin is a natural barrier to infection. It is the largest organ of your body. Burns damage this natural protection. To help prevent infection, it is very important to follow your caregiver's instructions in the care of your burn.  Burns are classified as:  · First degree. There is only redness of the skin (erythema). No scarring is expected.  · Second degree. There is blistering of the skin. Scarring may occur with deeper burns.  · Third degree. All layers of the skin are injured, and scarring is expected.  HOME CARE INSTRUCTIONS   · Wash your hands well before changing your bandage.  · Change your bandage as often as directed by your caregiver.  · Remove the old bandage. If the bandage sticks, you may soak it off with cool, clean water.  · Cleanse the burn thoroughly but gently with mild soap and water.  · Pat the area dry with a clean, dry cloth.  · Apply a thin layer of antibacterial cream to the burn.  · Apply a clean bandage as instructed by your caregiver.  · Keep the bandage as clean and dry as possible.  · Elevate the affected area for the first 24 hours, then as instructed by your caregiver.  · Only take over-the-counter or prescription medicines for pain, discomfort, or fever as directed by your caregiver.  SEEK IMMEDIATE MEDICAL CARE IF:   · You develop excessive pain.  · You develop redness, tenderness, swelling, or red streaks near the burn.  · The burned area develops yellowish-white fluid (pus) or a bad smell.  · You have a fever.  MAKE SURE YOU:   · Understand these instructions.  · Will watch your condition.  · Will get help right away if you are not doing well or get worse.  Document Released: 12/08/2005 Document Revised: 03/01/2012 Document Reviewed: 04/30/2011  [employer]® Patient Information ©2013 [employer].

## 2012-11-01 ENCOUNTER — Encounter: Payer: Medicare Other | Admitting: Family Medicine

## 2012-11-02 ENCOUNTER — Encounter: Payer: Self-pay | Admitting: Internal Medicine

## 2012-11-11 ENCOUNTER — Encounter: Payer: Medicare Other | Admitting: Family Medicine

## 2012-11-25 ENCOUNTER — Encounter: Payer: Self-pay | Admitting: Family Medicine

## 2012-11-25 ENCOUNTER — Ambulatory Visit (INDEPENDENT_AMBULATORY_CARE_PROVIDER_SITE_OTHER): Payer: Medicare Other | Admitting: Family Medicine

## 2012-11-25 VITALS — BP 122/80 | HR 100 | Temp 98.8°F | Resp 16 | Ht 65.0 in | Wt 134.0 lb

## 2012-11-25 DIAGNOSIS — F79 Unspecified intellectual disabilities: Secondary | ICD-10-CM | POA: Insufficient documentation

## 2012-11-25 DIAGNOSIS — J301 Allergic rhinitis due to pollen: Secondary | ICD-10-CM

## 2012-11-25 DIAGNOSIS — J45909 Unspecified asthma, uncomplicated: Secondary | ICD-10-CM

## 2012-11-25 DIAGNOSIS — I10 Essential (primary) hypertension: Secondary | ICD-10-CM

## 2012-11-25 DIAGNOSIS — F319 Bipolar disorder, unspecified: Secondary | ICD-10-CM | POA: Insufficient documentation

## 2012-11-25 DIAGNOSIS — T23229A Burn of second degree of unspecified single finger (nail) except thumb, initial encounter: Secondary | ICD-10-CM

## 2012-11-25 DIAGNOSIS — E785 Hyperlipidemia, unspecified: Secondary | ICD-10-CM | POA: Insufficient documentation

## 2012-11-25 DIAGNOSIS — Z79899 Other long term (current) drug therapy: Secondary | ICD-10-CM

## 2012-11-25 LAB — CBC WITH DIFFERENTIAL/PLATELET
Basophils Absolute: 0 10*3/uL (ref 0.0–0.1)
Basophils Relative: 1 % (ref 0–1)
Eosinophils Absolute: 0 10*3/uL (ref 0.0–0.7)
Eosinophils Relative: 0 % (ref 0–5)
HCT: 46 % (ref 39.0–52.0)
Hemoglobin: 15.7 g/dL (ref 13.0–17.0)
Lymphocytes Relative: 24 % (ref 12–46)
Lymphs Abs: 1.7 10*3/uL (ref 0.7–4.0)
MCH: 34.6 pg — ABNORMAL HIGH (ref 26.0–34.0)
MCHC: 34.1 g/dL (ref 30.0–36.0)
MCV: 101.3 fL — ABNORMAL HIGH (ref 78.0–100.0)
Monocytes Absolute: 0.9 10*3/uL (ref 0.1–1.0)
Monocytes Relative: 13 % — ABNORMAL HIGH (ref 3–12)
Neutro Abs: 4.3 10*3/uL (ref 1.7–7.7)
Neutrophils Relative %: 62 % (ref 43–77)
Platelets: 174 10*3/uL (ref 150–400)
RBC: 4.54 MIL/uL (ref 4.22–5.81)
RDW: 14.2 % (ref 11.5–15.5)
WBC: 6.9 10*3/uL (ref 4.0–10.5)

## 2012-11-25 MED ORDER — LISINOPRIL-HYDROCHLOROTHIAZIDE 20-12.5 MG PO TABS: 1.0000 | ORAL_TABLET | Freq: Every day | ORAL | Status: DC

## 2012-11-25 MED ORDER — FLUTICASONE-SALMETEROL 500-50 MCG/DOSE IN AEPB: 1.0000 | INHALATION_SPRAY | Freq: Two times a day (BID) | RESPIRATORY_TRACT | Status: DC

## 2012-11-25 MED ORDER — ATORVASTATIN CALCIUM 20 MG PO TABS: 20.0000 mg | ORAL_TABLET | Freq: Every day | ORAL | Status: DC

## 2012-11-25 MED ORDER — FLUTICASONE PROPIONATE 50 MCG/ACT NA SUSP: 2.0000 | Freq: Every day | NASAL | Status: DC

## 2012-11-25 MED ORDER — AMLODIPINE BESYLATE 5 MG PO TABS: 5.0000 mg | ORAL_TABLET | Freq: Every day | ORAL | Status: DC
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
## 2012-11-26 LAB — COMPREHENSIVE METABOLIC PANEL
ALT: 33 U/L (ref 0–53)
AST: 45 U/L — ABNORMAL HIGH (ref 0–37)
Albumin: 4.1 g/dL (ref 3.5–5.2)
Alkaline Phosphatase: 78 U/L (ref 39–117)
BUN: 6 mg/dL (ref 6–23)
CO2: 29 mEq/L (ref 19–32)
Calcium: 9.3 mg/dL (ref 8.4–10.5)
Chloride: 100 mEq/L (ref 96–112)
Creat: 0.7 mg/dL (ref 0.50–1.35)
Glucose, Bld: 99 mg/dL (ref 70–99)
Potassium: 4.5 mEq/L (ref 3.5–5.3)
Sodium: 135 mEq/L (ref 135–145)
Total Bilirubin: 0.9 mg/dL (ref 0.3–1.2)
Total Protein: 7.2 g/dL (ref 6.0–8.3)

## 2012-11-26 LAB — LIPID PANEL
Cholesterol: 153 mg/dL (ref 0–200)
HDL: 67 mg/dL (ref >39)
LDL Cholesterol: 72 mg/dL (ref 0–99)
Total CHOL/HDL Ratio: 2.3 Ratio
Triglycerides: 68 mg/dL (ref <150)
VLDL: 14 mg/dL (ref 0–40)

## 2012-11-29 ENCOUNTER — Other Ambulatory Visit: Payer: Self-pay | Admitting: Internal Medicine

## 2012-11-29 DIAGNOSIS — I10 Essential (primary) hypertension: Secondary | ICD-10-CM

## 2012-11-29 DIAGNOSIS — J45909 Unspecified asthma, uncomplicated: Secondary | ICD-10-CM

## 2012-11-29 DIAGNOSIS — J301 Allergic rhinitis due to pollen: Secondary | ICD-10-CM

## 2012-11-29 DIAGNOSIS — E785 Hyperlipidemia, unspecified: Secondary | ICD-10-CM

## 2012-11-29 MED ORDER — FLUTICASONE-SALMETEROL 500-50 MCG/DOSE IN AEPB: 1.0000 | INHALATION_SPRAY | Freq: Two times a day (BID) | RESPIRATORY_TRACT | Status: DC

## 2012-11-29 MED ORDER — LAMOTRIGINE 200 MG PO TABS: 200.0000 mg | ORAL_TABLET | Freq: Every morning | ORAL | Status: DC

## 2012-11-29 MED ORDER — AMLODIPINE BESYLATE 5 MG PO TABS: 5.0000 mg | ORAL_TABLET | Freq: Every day | ORAL | Status: DC

## 2012-11-29 MED ORDER — DIVALPROEX SODIUM 500 MG PO DR TAB: 2000.0000 mg | DELAYED_RELEASE_TABLET | Freq: Every day | ORAL | Status: DC

## 2012-11-29 MED ORDER — FLUTICASONE PROPIONATE 50 MCG/ACT NA SUSP: 2.0000 | Freq: Every day | NASAL | Status: DC

## 2012-11-29 MED ORDER — LISINOPRIL-HYDROCHLOROTHIAZIDE 20-12.5 MG PO TABS: 1.0000 | ORAL_TABLET | Freq: Every day | ORAL | Status: DC

## 2012-11-29 MED ORDER — ATORVASTATIN CALCIUM 20 MG PO TABS: 20.0000 mg | ORAL_TABLET | Freq: Every day | ORAL | Status: DC

## 2012-11-29 NOTE — Telephone Encounter (Signed)
Per jcl refill all meds for 1 year to [facility]

## 2012-11-29 NOTE — Telephone Encounter (Signed)
Pt needs a refill on all of his meds and he stated that you told him he could do a year supply #90 with refills is that okay to refill all of them for a year

## 2012-12-02 ENCOUNTER — Other Ambulatory Visit: Payer: Self-pay

## 2012-12-02 NOTE — Telephone Encounter (Signed)
Faxed back refill request for depakot refilled in error please send to other DR.

## 2012-12-29 ENCOUNTER — Ambulatory Visit (INDEPENDENT_AMBULATORY_CARE_PROVIDER_SITE_OTHER): Payer: Medicare Other | Admitting: Medical

## 2012-12-29 ENCOUNTER — Encounter: Payer: Self-pay | Admitting: Medical

## 2012-12-29 VITALS — BP 140/70 | HR 88 | Temp 98.5°F | Resp 16 | Wt 135.0 lb

## 2012-12-29 DIAGNOSIS — L0291 Cutaneous abscess, unspecified: Secondary | ICD-10-CM

## 2012-12-29 DIAGNOSIS — L0390 Cellulitis, unspecified: Secondary | ICD-10-CM

## 2012-12-29 MED ORDER — CEFTRIAXONE SODIUM 1 G IJ SOLR
1.0000 g | Freq: Once | INTRAMUSCULAR | Status: AC
  Administered 2012-12-29: 1 g via INTRAMUSCULAR

## 2012-12-29 MED ORDER — CEFUROXIME AXETIL 500 MG PO TABS: 500.0000 mg | ORAL_TABLET | Freq: Two times a day (BID) | ORAL | Status: DC

## 2012-12-29 NOTE — Progress Notes (Signed)
Subjective: Here for possible cellulitis.  He notes hx/o cellulitis of left elbow.  He also has psoriasis plaques of the elbows and scalp.   3 days ago started getting redness, warm, pain and swelling.  No drainage.  In the past the cellulitis was overlooked and go the point that he had to have the elbow drained.  He wants to prevent this.  He denies fever, nausea.    Past Medical History  Diagnosis Date  . Allergy   . Hypertension   . Bipolar disorder   . Alcohol abuse   . Tobacco use disorder   . Sleep apnea   . Asthma   . Peripheral vascular disease   . Aortic stenosis   . Heart murmur   . Neuromuscular disorder   . Family history of skin cancer   . Hyperlipidemia   . Coronary artery disease   . Carotid artery occlusion   . PAD (peripheral artery disease)   . Claudication   . Smoker    ROS as in subjective  Objective: Gen: wd,wn, nad Skin: left elbow with warmth, tender, mild swelling, 4cm around of erythema, no obvious induration of fluctuance, there is some scaling from psoriasis, but there is suggestion of cellulitis MSK: tender over olecranon bursa, but epicondyles nontender, rest of UE nontender Neurovascularly intact  Assessment: Encounter Diagnosis  Name Primary?  . Cellulitis of skin Yes   Plan: Rocephin 1g IM in office, begin Ceftin oral antibiotic.   Recheck by phone Friday if improving, but recheck OV Friday if worse. recheck soon prn.

## 2013-03-16 ENCOUNTER — Ambulatory Visit (INDEPENDENT_AMBULATORY_CARE_PROVIDER_SITE_OTHER): Payer: MEDICARE | Admitting: Medical

## 2013-03-16 ENCOUNTER — Encounter: Payer: Self-pay | Admitting: Medical

## 2013-03-16 VITALS — BP 142/82 | HR 105 | Temp 98.5°F | Resp 18 | Wt 138.0 lb

## 2013-03-16 DIAGNOSIS — J019 Acute sinusitis, unspecified: Secondary | ICD-10-CM

## 2013-03-16 DIAGNOSIS — J45909 Unspecified asthma, uncomplicated: Secondary | ICD-10-CM

## 2013-03-16 DIAGNOSIS — F17200 Nicotine dependence, unspecified, uncomplicated: Secondary | ICD-10-CM

## 2013-03-16 DIAGNOSIS — R05 Cough: Secondary | ICD-10-CM

## 2013-03-16 DIAGNOSIS — R059 Cough, unspecified: Secondary | ICD-10-CM

## 2013-03-16 MED ORDER — DOXYCYCLINE HYCLATE 100 MG PO TABS: 100.0000 mg | ORAL_TABLET | Freq: Two times a day (BID) | ORAL | Status: DC

## 2013-03-16 NOTE — Progress Notes (Signed)
Subjective:  Gregory [name] is a 62 y.o. male who presents for illness.  Ears hurt, burning itching eyes and ears, lots of nasal mucous, thick mucus drainage from nose, coughing, gagging.  Wife was here recently for sinus infection, and after 10 days of antibiotic and supportive care, she is doing better finally.  No fever, no NVD.  Some cough, mucous is loosening up some.  symptoms have persisted about 2+ weeks.  Throat burns at night.   He note some frontal and maxillary sinus pressure.  Past history is significant for asthma. Patient is a smoker.  No other aggravating or relieving factors.  No other c/o.  Past Medical History  Diagnosis Date  . Allergy   . Hypertension   . Bipolar disorder   . Alcohol abuse   . Tobacco use disorder   . Sleep apnea   . Asthma   . Peripheral vascular disease   . Aortic stenosis   . Heart murmur   . Neuromuscular disorder   . Family history of skin cancer   . Hyperlipidemia   . Coronary artery disease   . Carotid artery occlusion   . PAD (peripheral artery disease)   . Claudication   . Smoker    ROS as in subjective   Objective: Filed Vitals:   03/16/13 1333  BP: 142/82  Pulse: 105  Temp: 98.5 F (36.9 C)  Resp: 18    General appearance: Alert, WD/WN, no distress                             Skin: warm, no rash                           Head: +frontal sinus tenderness,                            Eyes: conjunctiva normal, corneas clear, PERRLA                            Ears: pearly TMs, external ear canals normal                          Nose: septum midline, turbinates swollen, with erythema and clear discharge             Mouth/throat: MMM, tongue normal, mild pharyngeal erythema                           Neck: supple, no adenopathy, no thyromegaly, nontender                          Heart: RRR, normal S1, S2, no murmurs                         Lungs:  Scattered rhonchi, scattered few wheezes, decreased breath sounds in general, but  no rales,no dullness to percussion    Assessment and Plan:   Encounter Diagnoses  Name Primary?  . Cough Yes  . Sinusitis, acute   . Unspecified asthma   . Tobacco use disorder    Prescription given for Doxycyline, increase albuterol rescue inhaler to BID the next few days, rest, hydrate well, c/t Advair and reest of medications as  usual.  Discussed symptomatic relief, nasal saline, and call or return if worse or not improving in 2-3 days, may need chest xray if not improving.

## 2013-03-24 ENCOUNTER — Telehealth: Payer: Self-pay | Admitting: Family Medicine

## 2013-03-24 NOTE — Telephone Encounter (Signed)
Pt called and he is 8 days into his antibiotic and not much better, please call in a different one to [facility] on [address].

## 2013-03-25 ENCOUNTER — Telehealth: Payer: Self-pay | Admitting: Family Medicine

## 2013-03-25 MED ORDER — CLARITHROMYCIN 500 MG PO TABS: 500.0000 mg | ORAL_TABLET | Freq: Two times a day (BID) | ORAL | Status: DC

## 2013-03-25 NOTE — Telephone Encounter (Signed)
CALLED PT TO INFORM HIM MED WAS CALLED IN

## 2013-03-25 NOTE — Telephone Encounter (Signed)
Pt called and wants another RX called into to [facility] on [address], pt not any better.

## 2013-03-25 NOTE — Telephone Encounter (Signed)
Tell him that I call the medication in.

## 2013-03-28 NOTE — Telephone Encounter (Signed)
Call and see how he is feeling, what are current symptoms?  Is he better, worse, same?

## 2013-03-28 NOTE — Telephone Encounter (Signed)
Patient states that while you were out Dr. Susann Givens called out a different medication and since he started that he feels much better. He said thanks for calling and checking on him. CLS

## 2013-06-23 ENCOUNTER — Ambulatory Visit (INDEPENDENT_AMBULATORY_CARE_PROVIDER_SITE_OTHER): Payer: Medicare Other | Admitting: Family Medicine

## 2013-06-23 ENCOUNTER — Encounter: Payer: Self-pay | Admitting: Family Medicine

## 2013-06-23 VITALS — BP 150/80 | HR 72 | Temp 98.5°F | Ht 65.0 in | Wt 134.0 lb

## 2013-06-23 DIAGNOSIS — H6092 Unspecified otitis externa, left ear: Secondary | ICD-10-CM

## 2013-06-23 DIAGNOSIS — H60399 Other infective otitis externa, unspecified ear: Secondary | ICD-10-CM

## 2013-06-23 DIAGNOSIS — J309 Allergic rhinitis, unspecified: Secondary | ICD-10-CM

## 2013-06-23 DIAGNOSIS — I10 Essential (primary) hypertension: Secondary | ICD-10-CM

## 2013-06-23 DIAGNOSIS — R05 Cough: Secondary | ICD-10-CM

## 2013-06-23 DIAGNOSIS — F17200 Nicotine dependence, unspecified, uncomplicated: Secondary | ICD-10-CM

## 2013-06-23 DIAGNOSIS — R059 Cough, unspecified: Secondary | ICD-10-CM

## 2013-06-23 MED ORDER — NEOMYCIN-POLYMYXIN-HC 3.5-10000-1 OT SOLN: 3.0000 [drp] | Freq: Four times a day (QID) | OTIC | Status: DC

## 2013-06-23 NOTE — Patient Instructions (Signed)
Restart neti-pot once or twice daily. Continue flonase 2 sprays each nostril--gentle sniffs Use an antihistamine daily (such as claritin, or zyrtec or allegra) Use drops in left ear.  Stop using peroxide in ears Continue to use Advair twice daily, and albuterol as needed.  Continue to use guaifenesin (I recommend Mucinex as directed, round the clock to keep the  Mucus clear and help with chest congestion). PLEASE STOP SMOKING  Please try and quit smoking--start thinking about why/when you smoke (habit, boredom, stress) in order to come up with effective strategies to cut back or quit. Available resources to help you quit include free counseling through [facility] ([facility]uit[facility] or 1-800-QUITNOW), smoking cessation classes through [facility] (call to find out schedule), over-the-counter nicotine replacements, and e-cigarettes (although this may not help break the hand-mouth habit).  Many insurance companies also have smoking cessation programs (which may decrease the cost of patches, meds if enrolled).  If these methods are not effective for you, and you are motivated to quit, return to discuss the possibility of prescription medications.

## 2013-06-23 NOTE — Progress Notes (Signed)
Chief Complaint  Patient presents with  . Cough    started 3 weeks ago as allergies and a cold. Is coughing up so much mucus that is clear. His eyes are tearing and ears have pain/pressure. He really would like an abx.    Symptoms started 3 weeks ago, when pollen was worse, seemed like allergies or a bad cold.  He started having pressure in his cheeks, forehead, and having discomfort in both ears, and burning in throat from postnasal drainage.  He started using Neti-pot the first week, which helped the frontal pressure, but didn't help with the cheek and ear pain.  Mucus was clear.    Mucus from nose is clear, and phlegm was initially yellow-green, but is clear now.  He has been using guaifenesin DM syrup, going through 2-3 bottles/week, and helped some.  He is having choking and gagging, having trouble taking his pills.  He presents due to ongoing symptoms, pain in sinuses and ears, reporting he has a sinus infection.  He continues to smoke.  Past Medical History  Diagnosis Date  . Allergy   . Hypertension   . Bipolar disorder   . Alcohol abuse   . Tobacco use disorder   . Sleep apnea   . Asthma   . Peripheral vascular disease   . Aortic stenosis   . Heart murmur   . Neuromuscular disorder   . Family history of skin cancer   . Hyperlipidemia   . Coronary artery disease   . Carotid artery occlusion   . PAD (peripheral artery disease)   . Claudication   . Smoker    Past Surgical History  Procedure Laterality Date  . Colonoscopy  2009  . Vasectomy reversal    . US echocardiography  06/20/2010    EF 55-60%  . Anal fistulectomy  09/25/11  . Vasectomy    . Elbow surgery      bilaterally for cubital tunnel   History   Social History  . Marital Status: Married    Spouse Name: N/A    Number of Children: N/A  . Years of Education: N/A   Occupational History  . Not on file.   Social History Main Topics  . Smoking status: Current Every Day Smoker -- 1.00 packs/day    Types:  Cigarettes  . Smokeless tobacco: Never Used  . Alcohol Use: 14.0 oz/week    28 drink(s) per week     Comment: 2 glasses of wine per night.  . Drug Use: No  . Sexually Active: Yes   Other Topics Concern  . Not on file   Social History Narrative  . No narrative on file   Current outpatient prescriptions:ALBUTEROL IN, Inhale 1-2 puffs into the lungs as needed., Disp: , Rfl: ;  amLODipine (NORVASC) 5 MG tablet, Take 1 tablet (5 mg total) by mouth daily., Disp: 90 tablet, Rfl: 3;  atorvastatin (LIPITOR) 20 MG tablet, Take 1 tablet (20 mg total) by mouth daily., Disp: 90 tablet, Rfl: 3;  divalproex (DEPAKOTE) 500 MG DR tablet, Take 4 tablets (2,000 mg total) by mouth at bedtime., Disp: 480 tablet, Rfl: 3 fluticasone (FLONASE) 50 MCG/ACT nasal spray, Place 2 sprays into the nose daily., Disp: 48 g, Rfl: 3;  Fluticasone-Salmeterol (ADVAIR DISKUS) 500-50 MCG/DOSE AEPB, Inhale 1 puff into the lungs every 12 (twelve) hours., Disp: 180 each, Rfl: 3;  lamoTRIgine (LAMICTAL) 200 MG tablet, Take 1 tablet (200 mg total) by mouth every morning., Disp: 90 tablet, Rfl: 3 lisinopril-hydrochlorothiazide (PRINZIDE,ZESTORETIC)  20-12.5 MG per tablet, Take 1 tablet by mouth daily., Disp: 90 tablet, Rfl: 0;  cetirizine (ZYRTEC) 5 MG tablet, Take 5 mg by mouth daily., Disp: , Rfl: ;  neomycin-polymyxin-hydrocortisone (CORTISPORIN) otic solution, Place 3 drops into the left ear 4 (four) times daily., Disp: 10 mL, Rfl: 0;  silver sulfADIAZINE (SILVADENE) 1 % cream, Apply topically 2 (two) times daily., Disp: 50 g, Rfl: 0  Allergies  Allergen Reactions  . Codeine Anaphylaxis  . Amoxicillin     intolerance  . Augmentin (Amoxicillin-Pot Clavulanate)     nausea   ROS:  Denies fevers, nausea, vomiting, diarrhea, skin rash, bleeding/bruising.  +headache, cough, congestion as per HPI.  Denies chest pain  PHYSICAL EXAM: BP 150/80  Pulse 72  Temp(Src) 98.5 F (36.9 C) (Oral)  Ht 5\' 5"  (1.651 m)  Wt 134 lb (60.782 kg)   BMI 22.3 kg/m2  Talkative male, slightly tremulous/resting tremor.  No distress HEENT:  PERRL, EOMI, conjunctiva clear.  TM's normal bilaterally.  L EAC has area of erythema and irritation inferiorly along the canal.  No swelling of canal noted.  OP clear without lesions Heart: regular rate and rhythm Lungs: ronchi initially, which cleared after coughing (clearish white mucus).  Intermittent trace wheeze, otherwise pretty clear.  No rales. Extremities: no edema   ASSESSMENT/PLAN: Cough  Active smoker  Allergic rhinitis, cause unspecified  Otitis externa, left - Plan: neomycin-polymyxin-hydrocortisone (CORTISPORIN) otic solution  Hypertension  Allergies Smoker, with possible chronic bronchitis. No evidence of bacterial infection.  Restart neti-pot once or twice daily. Continue flonase 2 sprays each nostril--gentle sniffs Use an antihistamine daily (such as claritin, or zyrtec or allegra) Use drops in left ear.  Stop using peroxide in ears Continue to use Advair twice daily, and albuterol as needed.  Continue to use guaifenesin (I recommend Mucinex as directed, round the clock to keep the  Mucus clear and help with chest congestion). PLEASE STOP SMOKING--he has quit many times in past (when around grandchildren).  Briefly reviewed risks and reasons for staying off cigarettes.  Avoid decongestants due to HTN.

## 2013-08-11 ENCOUNTER — Institutional Professional Consult (permissible substitution): Payer: Medicare Other | Admitting: Family Medicine

## 2013-08-17 ENCOUNTER — Institutional Professional Consult (permissible substitution): Payer: Medicare Other | Admitting: Medical

## 2013-08-23 ENCOUNTER — Institutional Professional Consult (permissible substitution): Payer: Medicare Other | Admitting: Medical

## 2013-08-25 ENCOUNTER — Institutional Professional Consult (permissible substitution): Payer: Medicare Other | Admitting: Medical

## 2013-08-29 ENCOUNTER — Inpatient Hospital Stay (HOSPITAL_COMMUNITY)
Admission: EM | Admit: 2013-08-29 | Discharge: 2013-09-01 | DRG: 193 | Disposition: A | Discharge status: Skilled Nursing Facility | Payer: Medicare Other | Attending: Family Medicine | Admitting: Family Medicine

## 2013-08-29 ENCOUNTER — Emergency Department (HOSPITAL_COMMUNITY): Payer: Medicare Other

## 2013-08-29 ENCOUNTER — Encounter (HOSPITAL_COMMUNITY): Payer: Self-pay | Admitting: Emergency Medicine

## 2013-08-29 ENCOUNTER — Institutional Professional Consult (permissible substitution): Payer: Medicare Other | Admitting: Medical

## 2013-08-29 ENCOUNTER — Inpatient Hospital Stay (HOSPITAL_COMMUNITY): Payer: Medicare Other

## 2013-08-29 ENCOUNTER — Observation Stay (HOSPITAL_COMMUNITY): Payer: Medicare Other

## 2013-08-29 DIAGNOSIS — F319 Bipolar disorder, unspecified: Secondary | ICD-10-CM

## 2013-08-29 DIAGNOSIS — J9600 Acute respiratory failure, unspecified whether with hypoxia or hypercapnia: Secondary | ICD-10-CM

## 2013-08-29 DIAGNOSIS — J301 Allergic rhinitis due to pollen: Secondary | ICD-10-CM

## 2013-08-29 DIAGNOSIS — R011 Cardiac murmur, unspecified: Secondary | ICD-10-CM | POA: Diagnosis present

## 2013-08-29 DIAGNOSIS — I2510 Atherosclerotic heart disease of native coronary artery without angina pectoris: Secondary | ICD-10-CM | POA: Diagnosis present

## 2013-08-29 DIAGNOSIS — M545 Low back pain, unspecified: Secondary | ICD-10-CM | POA: Diagnosis present

## 2013-08-29 DIAGNOSIS — I248 Other forms of acute ischemic heart disease: Secondary | ICD-10-CM | POA: Diagnosis present

## 2013-08-29 DIAGNOSIS — E869 Volume depletion, unspecified: Secondary | ICD-10-CM | POA: Diagnosis present

## 2013-08-29 DIAGNOSIS — R7989 Other specified abnormal findings of blood chemistry: Secondary | ICD-10-CM

## 2013-08-29 DIAGNOSIS — G8929 Other chronic pain: Secondary | ICD-10-CM | POA: Diagnosis present

## 2013-08-29 DIAGNOSIS — R7402 Elevation of levels of lactic acid dehydrogenase (LDH): Secondary | ICD-10-CM | POA: Diagnosis present

## 2013-08-29 DIAGNOSIS — F1020 Alcohol dependence, uncomplicated: Secondary | ICD-10-CM

## 2013-08-29 DIAGNOSIS — J45909 Unspecified asthma, uncomplicated: Secondary | ICD-10-CM

## 2013-08-29 DIAGNOSIS — W19XXXA Unspecified fall, initial encounter: Secondary | ICD-10-CM

## 2013-08-29 DIAGNOSIS — I959 Hypotension, unspecified: Secondary | ICD-10-CM | POA: Diagnosis present

## 2013-08-29 DIAGNOSIS — E785 Hyperlipidemia, unspecified: Secondary | ICD-10-CM

## 2013-08-29 DIAGNOSIS — R55 Syncope and collapse: Secondary | ICD-10-CM

## 2013-08-29 DIAGNOSIS — F17200 Nicotine dependence, unspecified, uncomplicated: Secondary | ICD-10-CM

## 2013-08-29 DIAGNOSIS — I2489 Other forms of acute ischemic heart disease: Secondary | ICD-10-CM | POA: Diagnosis present

## 2013-08-29 DIAGNOSIS — R748 Abnormal levels of other serum enzymes: Secondary | ICD-10-CM | POA: Diagnosis present

## 2013-08-29 DIAGNOSIS — G709 Myoneural disorder, unspecified: Secondary | ICD-10-CM | POA: Diagnosis present

## 2013-08-29 DIAGNOSIS — Z720 Tobacco use: Secondary | ICD-10-CM | POA: Diagnosis present

## 2013-08-29 DIAGNOSIS — W19XXXS Unspecified fall, sequela: Secondary | ICD-10-CM

## 2013-08-29 DIAGNOSIS — I10 Essential (primary) hypertension: Secondary | ICD-10-CM

## 2013-08-29 DIAGNOSIS — J189 Pneumonia, unspecified organism: Principal | ICD-10-CM

## 2013-08-29 DIAGNOSIS — W1809XA Striking against other object with subsequent fall, initial encounter: Secondary | ICD-10-CM | POA: Diagnosis present

## 2013-08-29 DIAGNOSIS — S20219A Contusion of unspecified front wall of thorax, initial encounter: Secondary | ICD-10-CM | POA: Diagnosis present

## 2013-08-29 DIAGNOSIS — G40909 Epilepsy, unspecified, not intractable, without status epilepticus: Secondary | ICD-10-CM | POA: Diagnosis present

## 2013-08-29 DIAGNOSIS — I739 Peripheral vascular disease, unspecified: Secondary | ICD-10-CM | POA: Diagnosis present

## 2013-08-29 DIAGNOSIS — F79 Unspecified intellectual disabilities: Secondary | ICD-10-CM

## 2013-08-29 DIAGNOSIS — S20212A Contusion of left front wall of thorax, initial encounter: Secondary | ICD-10-CM

## 2013-08-29 DIAGNOSIS — R269 Unspecified abnormalities of gait and mobility: Secondary | ICD-10-CM | POA: Diagnosis present

## 2013-08-29 DIAGNOSIS — G4730 Sleep apnea, unspecified: Secondary | ICD-10-CM | POA: Diagnosis present

## 2013-08-29 DIAGNOSIS — J441 Chronic obstructive pulmonary disease with (acute) exacerbation: Secondary | ICD-10-CM

## 2013-08-29 DIAGNOSIS — R7401 Elevation of levels of liver transaminase levels: Secondary | ICD-10-CM | POA: Diagnosis present

## 2013-08-29 DIAGNOSIS — R778 Other specified abnormalities of plasma proteins: Secondary | ICD-10-CM

## 2013-08-29 DIAGNOSIS — K5900 Constipation, unspecified: Secondary | ICD-10-CM | POA: Diagnosis not present

## 2013-08-29 HISTORY — DX: Other chronic pain: G89.29

## 2013-08-29 HISTORY — DX: Dorsalgia, unspecified: M54.9

## 2013-08-29 HISTORY — DX: Unspecified right bundle-branch block: I45.10

## 2013-08-29 LAB — BASIC METABOLIC PANEL
BUN: 6 mg/dL (ref 6–23)
CO2: 26 mEq/L (ref 19–32)
Calcium: 8.8 mg/dL (ref 8.4–10.5)
Chloride: 93 mEq/L — ABNORMAL LOW (ref 96–112)
Creatinine, Ser: 0.52 mg/dL (ref 0.50–1.35)
GFR calc Af Amer: >90 mL/min (ref >90)
GFR calc non Af Amer: >90 mL/min (ref >90)
Glucose, Bld: 227 mg/dL — ABNORMAL HIGH (ref 70–99)
Potassium: 3.7 mEq/L (ref 3.5–5.1)
Sodium: 133 mEq/L — ABNORMAL LOW (ref 135–145)

## 2013-08-29 LAB — BLOOD GAS, ARTERIAL
Acid-Base Excess: 0.4 mmol/L (ref 0.0–2.0)
Bicarbonate: 24 mEq/L (ref 20.0–24.0)
Drawn by: 103701
O2 Content: 6 L/min
O2 Saturation: 95.2 %
Patient temperature: 98.6
TCO2: 20.9 mmol/L (ref 0–100)
pCO2 arterial: 37.1 mmHg (ref 35.0–45.0)
pH, Arterial: 7.427 (ref 7.350–7.450)
pO2, Arterial: 78.1 mmHg — ABNORMAL LOW (ref 80.0–100.0)

## 2013-08-29 LAB — COMPREHENSIVE METABOLIC PANEL
ALT: 45 U/L (ref 0–53)
AST: 69 U/L — ABNORMAL HIGH (ref 0–37)
Albumin: 3.3 g/dL — ABNORMAL LOW (ref 3.5–5.2)
Alkaline Phosphatase: 119 U/L — ABNORMAL HIGH (ref 39–117)
BUN: 5 mg/dL — ABNORMAL LOW (ref 6–23)
CO2: 24 mEq/L (ref 19–32)
Calcium: 8.8 mg/dL (ref 8.4–10.5)
Chloride: 95 mEq/L — ABNORMAL LOW (ref 96–112)
Creatinine, Ser: 0.86 mg/dL (ref 0.50–1.35)
GFR calc Af Amer: >90 mL/min (ref >90)
GFR calc non Af Amer: >90 mL/min (ref >90)
Glucose, Bld: 117 mg/dL — ABNORMAL HIGH (ref 70–99)
Potassium: 4.4 mEq/L (ref 3.5–5.1)
Sodium: 136 mEq/L (ref 135–145)
Total Bilirubin: 0.3 mg/dL (ref 0.3–1.2)
Total Protein: 7.7 g/dL (ref 6.0–8.3)

## 2013-08-29 LAB — PROTIME-INR
INR: 1 (ref 0.00–1.49)
Prothrombin Time: 13 seconds (ref 11.6–15.2)

## 2013-08-29 LAB — RAPID URINE DRUG SCREEN, HOSP PERFORMED
Amphetamines: NOT DETECTED
Barbiturates: NOT DETECTED
Benzodiazepines: NOT DETECTED
Cocaine: NOT DETECTED
Opiates: NOT DETECTED
Tetrahydrocannabinol: NOT DETECTED

## 2013-08-29 LAB — HEPARIN LEVEL (UNFRACTIONATED): Heparin Unfractionated: 0.19 IU/mL — ABNORMAL LOW (ref 0.30–0.70)

## 2013-08-29 LAB — URINALYSIS, ROUTINE W REFLEX MICROSCOPIC
Bilirubin Urine: NEGATIVE
Glucose, UA: 100 mg/dL — AB
Ketones, ur: NEGATIVE mg/dL
Leukocytes, UA: NEGATIVE
Nitrite: NEGATIVE
Protein, ur: NEGATIVE mg/dL
Specific Gravity, Urine: 1.022 (ref 1.005–1.030)
Urobilinogen, UA: 0.2 mg/dL (ref 0.0–1.0)
pH: 5 (ref 5.0–8.0)

## 2013-08-29 LAB — CBC
HCT: 39.5 % (ref 39.0–52.0)
Hemoglobin: 13.6 g/dL (ref 13.0–17.0)
MCH: 34.2 pg — ABNORMAL HIGH (ref 26.0–34.0)
MCHC: 34.4 g/dL (ref 30.0–36.0)
MCV: 99.2 fL (ref 78.0–100.0)
Platelets: 131 10*3/uL — ABNORMAL LOW (ref 150–400)
RBC: 3.98 MIL/uL — ABNORMAL LOW (ref 4.22–5.81)
RDW: 14.1 % (ref 11.5–15.5)
WBC: 5.3 10*3/uL (ref 4.0–10.5)

## 2013-08-29 LAB — CBC WITH DIFFERENTIAL/PLATELET
Basophils Absolute: 0 10*3/uL (ref 0.0–0.1)
Basophils Relative: 0 % (ref 0–1)
Eosinophils Absolute: 0 10*3/uL (ref 0.0–0.7)
Eosinophils Relative: 0 % (ref 0–5)
HCT: 45 % (ref 39.0–52.0)
Hemoglobin: 15.9 g/dL (ref 13.0–17.0)
Lymphocytes Relative: 20 % (ref 12–46)
Lymphs Abs: 1.5 10*3/uL (ref 0.7–4.0)
MCH: 34.5 pg — ABNORMAL HIGH (ref 26.0–34.0)
MCHC: 35.3 g/dL (ref 30.0–36.0)
MCV: 97.6 fL (ref 78.0–100.0)
Monocytes Absolute: 0.6 10*3/uL (ref 0.1–1.0)
Monocytes Relative: 8 % (ref 3–12)
Neutro Abs: 5.3 10*3/uL (ref 1.7–7.7)
Neutrophils Relative %: 72 % (ref 43–77)
Platelets: 151 10*3/uL (ref 150–400)
RBC: 4.61 MIL/uL (ref 4.22–5.81)
RDW: 13.7 % (ref 11.5–15.5)
WBC: 7.4 10*3/uL (ref 4.0–10.5)

## 2013-08-29 LAB — D-DIMER, QUANTITATIVE: D-Dimer, Quant: 2.2 ug/mL-FEU — ABNORMAL HIGH (ref 0.00–0.48)

## 2013-08-29 LAB — URINE MICROSCOPIC-ADD ON

## 2013-08-29 LAB — TROPONIN I
Troponin I: <0.3 ng/mL (ref <0.30)
Troponin I: 0.71 ng/mL (ref <0.30)

## 2013-08-29 LAB — CREATININE, SERUM
Creatinine, Ser: 0.64 mg/dL (ref 0.50–1.35)
GFR calc Af Amer: >90 mL/min (ref >90)
GFR calc non Af Amer: >90 mL/min (ref >90)

## 2013-08-29 LAB — VALPROIC ACID LEVEL: Valproic Acid Lvl: <10 ug/mL — ABNORMAL LOW (ref 50.0–100.0)

## 2013-08-29 LAB — PRO B NATRIURETIC PEPTIDE: Pro B Natriuretic peptide (BNP): 182.2 pg/mL — ABNORMAL HIGH (ref 0–125)

## 2013-08-29 LAB — TSH: TSH: 0.702 u[IU]/mL (ref 0.350–4.500)

## 2013-08-29 LAB — STREP PNEUMONIAE URINARY ANTIGEN: Strep Pneumo Urinary Antigen: NEGATIVE

## 2013-08-29 LAB — APTT: aPTT: 48 seconds — ABNORMAL HIGH (ref 24–37)

## 2013-08-29 MED ORDER — METHYLPREDNISOLONE SODIUM SUCC 125 MG IJ SOLR
125.0000 mg | Freq: Once | INTRAMUSCULAR | Status: AC
  Administered 2013-08-29: 125 mg via INTRAVENOUS
  Filled 2013-08-29: qty 2

## 2013-08-29 MED ORDER — IPRATROPIUM BROMIDE 0.02 % IN SOLN
0.5000 mg | Freq: Four times a day (QID) | RESPIRATORY_TRACT | Status: DC
  Administered 2013-08-29 – 2013-08-30 (×6): 0.5 mg via RESPIRATORY_TRACT
  Filled 2013-08-29 (×6): qty 2.5

## 2013-08-29 MED ORDER — SODIUM CHLORIDE 0.9 % IV BOLUS (SEPSIS): 1000.0000 mL | Freq: Once | INTRAVENOUS | Status: DC

## 2013-08-29 MED ORDER — ALBUTEROL SULFATE (5 MG/ML) 0.5% IN NEBU
2.5000 mg | INHALATION_SOLUTION | RESPIRATORY_TRACT | Status: DC | PRN
  Filled 2013-08-29: qty 0.5

## 2013-08-29 MED ORDER — HEPARIN (PORCINE) IN NACL 100-0.45 UNIT/ML-% IJ SOLN
700.0000 [IU]/h | INTRAMUSCULAR | Status: DC
  Administered 2013-08-29: 700 [IU]/h via INTRAVENOUS
  Filled 2013-08-29: qty 250

## 2013-08-29 MED ORDER — ATORVASTATIN CALCIUM 80 MG PO TABS
80.0000 mg | ORAL_TABLET | Freq: Every day | ORAL | Status: DC
  Administered 2013-08-29: 80 mg via ORAL
  Filled 2013-08-29: qty 1

## 2013-08-29 MED ORDER — DEXTROSE 5 % IV SOLN: 500.0000 mg | INTRAVENOUS | Status: DC

## 2013-08-29 MED ORDER — OXYCODONE-ACETAMINOPHEN 5-325 MG PO TABS
1.0000 | ORAL_TABLET | Freq: Four times a day (QID) | ORAL | Status: DC | PRN
  Administered 2013-08-29 (×3): 1 via ORAL
  Filled 2013-08-29 (×3): qty 1

## 2013-08-29 MED ORDER — LAMOTRIGINE 200 MG PO TABS
200.0000 mg | ORAL_TABLET | Freq: Every morning | ORAL | Status: DC
  Administered 2013-08-29 – 2013-09-01 (×4): 200 mg via ORAL
  Filled 2013-08-29 (×4): qty 1

## 2013-08-29 MED ORDER — THIAMINE HCL 100 MG/ML IJ SOLN
100.0000 mg | Freq: Every day | INTRAMUSCULAR | Status: DC
  Filled 2013-08-29 (×4): qty 1

## 2013-08-29 MED ORDER — FOLIC ACID 1 MG PO TABS
1.0000 mg | ORAL_TABLET | Freq: Every day | ORAL | Status: DC
  Administered 2013-08-29: 1 mg via ORAL
  Filled 2013-08-29: qty 1

## 2013-08-29 MED ORDER — SODIUM CHLORIDE 0.9 % IV BOLUS (SEPSIS)
500.0000 mL | Freq: Once | INTRAVENOUS | Status: AC
  Administered 2013-08-29: 500 mL via INTRAVENOUS

## 2013-08-29 MED ORDER — ADULT MULTIVITAMIN W/MINERALS CH
1.0000 | ORAL_TABLET | Freq: Every day | ORAL | Status: DC
  Administered 2013-08-29 – 2013-09-01 (×4): 1 via ORAL
  Filled 2013-08-29 (×5): qty 1

## 2013-08-29 MED ORDER — BUDESONIDE 0.25 MG/2ML IN SUSP
0.2500 mg | Freq: Two times a day (BID) | RESPIRATORY_TRACT | Status: DC
  Administered 2013-08-29 – 2013-08-30 (×3): 0.25 mg via RESPIRATORY_TRACT
  Filled 2013-08-29 (×5): qty 2

## 2013-08-29 MED ORDER — LEVOFLOXACIN IN D5W 750 MG/150ML IV SOLN
750.0000 mg | INTRAVENOUS | Status: DC
  Filled 2013-08-29: qty 150

## 2013-08-29 MED ORDER — FLUTICASONE PROPIONATE 50 MCG/ACT NA SUSP
2.0000 | Freq: Every day | NASAL | Status: DC
  Administered 2013-08-29 – 2013-09-01 (×4): 2 via NASAL
  Filled 2013-08-29: qty 16

## 2013-08-29 MED ORDER — OXYCODONE-ACETAMINOPHEN 5-325 MG PO TABS
1.0000 | ORAL_TABLET | Freq: Once | ORAL | Status: AC
  Administered 2013-08-29: 1 via ORAL
  Filled 2013-08-29: qty 1

## 2013-08-29 MED ORDER — PNEUMOCOCCAL VAC POLYVALENT 25 MCG/0.5ML IJ INJ
0.5000 mL | INJECTION | INTRAMUSCULAR | Status: AC
  Administered 2013-08-30: 0.5 mL via INTRAMUSCULAR
  Filled 2013-08-29 (×2): qty 0.5

## 2013-08-29 MED ORDER — ALBUTEROL SULFATE (5 MG/ML) 0.5% IN NEBU
2.5000 mg | INHALATION_SOLUTION | Freq: Four times a day (QID) | RESPIRATORY_TRACT | Status: DC
  Administered 2013-08-29 – 2013-08-30 (×7): 2.5 mg via RESPIRATORY_TRACT
  Filled 2013-08-29 (×7): qty 0.5

## 2013-08-29 MED ORDER — MORPHINE SULFATE 2 MG/ML IJ SOLN: 1.0000 mg | INTRAMUSCULAR | Status: DC | PRN

## 2013-08-29 MED ORDER — ENOXAPARIN SODIUM 40 MG/0.4ML ~~LOC~~ SOLN
40.0000 mg | SUBCUTANEOUS | Status: DC
  Administered 2013-08-29: 40 mg via SUBCUTANEOUS
  Filled 2013-08-29: qty 0.4

## 2013-08-29 MED ORDER — DIVALPROEX SODIUM 500 MG PO DR TAB
2000.0000 mg | DELAYED_RELEASE_TABLET | Freq: Every day | ORAL | Status: DC
  Administered 2013-08-29: 2000 mg via ORAL
  Filled 2013-08-29 (×2): qty 4

## 2013-08-29 MED ORDER — SODIUM CHLORIDE 0.9 % IV SOLN: INTRAVENOUS | Status: AC

## 2013-08-29 MED ORDER — LEVOFLOXACIN 750 MG PO TABS
750.0000 mg | ORAL_TABLET | Freq: Every day | ORAL | Status: DC
  Administered 2013-08-29: 750 mg via ORAL
  Filled 2013-08-29: qty 1

## 2013-08-29 MED ORDER — ACETAMINOPHEN 650 MG RE SUPP: 650.0000 mg | Freq: Four times a day (QID) | RECTAL | Status: DC | PRN

## 2013-08-29 MED ORDER — IOHEXOL 350 MG/ML SOLN
100.0000 mL | Freq: Once | INTRAVENOUS | Status: AC | PRN
  Administered 2013-08-29: 100 mL via INTRAVENOUS

## 2013-08-29 MED ORDER — SODIUM CHLORIDE 0.9 % IV BOLUS (SEPSIS)
1000.0000 mL | Freq: Once | INTRAVENOUS | Status: AC
  Administered 2013-08-29: 1000 mL via INTRAVENOUS

## 2013-08-29 MED ORDER — KETOROLAC TROMETHAMINE 15 MG/ML IJ SOLN
15.0000 mg | Freq: Three times a day (TID) | INTRAMUSCULAR | Status: DC | PRN
  Administered 2013-08-30 (×2): 15 mg via INTRAVENOUS
  Filled 2013-08-29 (×2): qty 1

## 2013-08-29 MED ORDER — SODIUM CHLORIDE 0.9 % IJ SOLN
3.0000 mL | Freq: Two times a day (BID) | INTRAMUSCULAR | Status: DC
  Administered 2013-08-29 – 2013-08-30 (×2): 3 mL via INTRAVENOUS

## 2013-08-29 MED ORDER — ASPIRIN 81 MG PO CHEW
81.0000 mg | CHEWABLE_TABLET | Freq: Every day | ORAL | Status: DC
  Administered 2013-08-29 – 2013-09-01 (×4): 81 mg via ORAL
  Filled 2013-08-29 (×4): qty 1

## 2013-08-29 MED ORDER — METHYLPREDNISOLONE SODIUM SUCC 125 MG IJ SOLR
60.0000 mg | Freq: Two times a day (BID) | INTRAMUSCULAR | Status: DC
  Administered 2013-08-29 – 2013-08-30 (×2): 60 mg via INTRAVENOUS
  Filled 2013-08-29 (×4): qty 0.96

## 2013-08-29 MED ORDER — DEXTROSE 5 % IV SOLN: 1.0000 g | INTRAVENOUS | Status: DC

## 2013-08-29 MED ORDER — HYDRALAZINE HCL 20 MG/ML IJ SOLN: 10.0000 mg | INTRAMUSCULAR | Status: DC | PRN

## 2013-08-29 MED ORDER — HEPARIN BOLUS VIA INFUSION
2000.0000 [IU] | Freq: Once | INTRAVENOUS | Status: DC
  Filled 2013-08-29: qty 2000

## 2013-08-29 MED ORDER — ACETAMINOPHEN 325 MG PO TABS: 650.0000 mg | ORAL_TABLET | Freq: Four times a day (QID) | ORAL | Status: DC | PRN

## 2013-08-29 MED ORDER — ONDANSETRON HCL 4 MG PO TABS: 4.0000 mg | ORAL_TABLET | Freq: Four times a day (QID) | ORAL | Status: DC | PRN

## 2013-08-29 MED ORDER — VITAMIN B-1 100 MG PO TABS
100.0000 mg | ORAL_TABLET | Freq: Every day | ORAL | Status: DC
  Administered 2013-08-29 – 2013-09-01 (×4): 100 mg via ORAL
  Filled 2013-08-29 (×4): qty 1

## 2013-08-29 MED ORDER — ATORVASTATIN CALCIUM 20 MG PO TABS
20.0000 mg | ORAL_TABLET | Freq: Every day | ORAL | Status: DC
  Administered 2013-08-29: 20 mg via ORAL
  Filled 2013-08-29: qty 1

## 2013-08-29 MED ORDER — LORAZEPAM 1 MG PO TABS
1.0000 mg | ORAL_TABLET | Freq: Four times a day (QID) | ORAL | Status: AC | PRN
  Administered 2013-08-29 – 2013-08-30 (×2): 1 mg via ORAL
  Filled 2013-08-29 (×2): qty 1

## 2013-08-29 MED ORDER — PROMETHAZINE HCL 25 MG/ML IJ SOLN
12.5000 mg | Freq: Four times a day (QID) | INTRAMUSCULAR | Status: DC | PRN
  Administered 2013-08-30: 12.5 mg via INTRAVENOUS
  Filled 2013-08-29: qty 1

## 2013-08-29 MED ORDER — IPRATROPIUM BROMIDE 0.02 % IN SOLN
0.5000 mg | Freq: Once | RESPIRATORY_TRACT | Status: AC
  Administered 2013-08-29: 0.5 mg via RESPIRATORY_TRACT
  Filled 2013-08-29: qty 2.5

## 2013-08-29 MED ORDER — ONDANSETRON HCL 4 MG/2ML IJ SOLN: 4.0000 mg | Freq: Four times a day (QID) | INTRAMUSCULAR | Status: DC | PRN

## 2013-08-29 MED ORDER — SODIUM CHLORIDE 0.9 % IV SOLN
INTRAVENOUS | Status: DC
  Administered 2013-08-29 – 2013-09-01 (×5): via INTRAVENOUS

## 2013-08-29 MED ORDER — LORAZEPAM 2 MG/ML IJ SOLN
1.0000 mg | Freq: Four times a day (QID) | INTRAMUSCULAR | Status: AC | PRN
  Administered 2013-08-29: 1 mg via INTRAVENOUS
  Filled 2013-08-29: qty 1

## 2013-08-29 MED ORDER — ALBUTEROL (5 MG/ML) CONTINUOUS INHALATION SOLN
10.0000 mg/h | INHALATION_SOLUTION | RESPIRATORY_TRACT | Status: DC
  Administered 2013-08-29: 10 mg/h via RESPIRATORY_TRACT

## 2013-08-29 MED ORDER — HEPARIN (PORCINE) IN NACL 100-0.45 UNIT/ML-% IJ SOLN
850.0000 [IU]/h | INTRAMUSCULAR | Status: AC
  Filled 2013-08-29: qty 250

## 2013-08-29 NOTE — Progress Notes (Signed)
Spoke to patient about allergy to codeine and he stated it was long time ago and he had percocet in the emergency room this admission and did not have any problem with it.

## 2013-08-29 NOTE — ED Provider Notes (Signed)
CSN: [id number]     Arrival date & time 08/29/13  0007 History   First MD Initiated Contact with Patient 08/29/13 0030     Chief Complaint  Patient presents with  . Fall   (Consider location/radiation/quality/duration/timing/severity/associated sxs/prior Treatment) Patient is a 62 y.o. male presenting with fall and syncope. The history is provided by the patient.  Fall This is a recurrent problem. Episode frequency: intermittently. The problem has been resolved. Pertinent negatives include no chest pain, no abdominal pain, no headaches and no shortness of breath. Exacerbated by: standing up from a seated position. Nothing relieves the symptoms. He has tried nothing for the symptoms. The treatment provided no relief.  Loss of Consciousness Episode history:  Multiple Most recent episode:  Today Timing:  Intermittent Progression:  Resolved Chronicity:  New Context comment:  Upon standing Witnessed: no   Relieved by:  Nothing Worsened by:  Nothing tried Ineffective treatments:  None tried Associated symptoms: no chest pain, no fever, no headaches, no nausea, no shortness of breath and no vomiting     Past Medical History  Diagnosis Date  . Allergy   . Hypertension   . Bipolar disorder   . Alcohol abuse   . Tobacco use disorder   . Sleep apnea   . Asthma   . Peripheral vascular disease   . Aortic stenosis   . Heart murmur   . Neuromuscular disorder   . Family history of skin cancer   . Hyperlipidemia   . Coronary artery disease   . Carotid artery occlusion   . PAD (peripheral artery disease)   . Claudication   . Smoker    Past Surgical History  Procedure Laterality Date  . Colonoscopy  2009  . Vasectomy reversal    . US echocardiography  06/20/2010    EF 55-60%  . Anal fistulectomy  09/25/11  . Vasectomy    . Elbow surgery      bilaterally for cubital tunnel   Family History  Problem Relation Age of Onset  . Stroke Father 29  . Cancer Father     skin  .  Hypertension Brother   . Diabetes Brother   . Cancer Maternal Aunt     skin  . Cancer Maternal Grandmother     liver   History  Substance Use Topics  . Smoking status: Current Every Day Smoker -- 1.00 packs/day    Types: Cigarettes  . Smokeless tobacco: Never Used  . Alcohol Use: 14.0 oz/week    28 drink(s) per week     Comment: 2 glasses of wine per night.    Review of Systems  Constitutional: Negative for fever.  HENT: Negative for rhinorrhea, drooling and neck pain.   Eyes: Negative for pain.  Respiratory: Positive for cough (chronic). Negative for shortness of breath.   Cardiovascular: Positive for syncope. Negative for chest pain and leg swelling.  Gastrointestinal: Negative for nausea, vomiting, abdominal pain and diarrhea.  Genitourinary: Negative for dysuria and hematuria.  Musculoskeletal: Negative for gait problem.  Skin: Negative for color change.  Neurological: Positive for light-headedness. Negative for numbness and headaches.  Hematological: Negative for adenopathy.  Psychiatric/Behavioral: Negative for behavioral problems.  All other systems reviewed and are negative.    Allergies  Codeine; Amoxicillin; and Augmentin  Home Medications   Current Outpatient Rx  Name  Route  Sig  Dispense  Refill  . ALBUTEROL IN   Inhalation   Inhale 1-2 puffs into the lungs as needed.         [name]  amLODipine (NORVASC) 5 MG tablet   Oral   Take 1 tablet (5 mg total) by mouth daily.   90 tablet   3   . atorvastatin (LIPITOR) 20 MG tablet   Oral   Take 1 tablet (20 mg total) by mouth daily.   90 tablet   3   . cetirizine (ZYRTEC) 5 MG tablet   Oral   Take 5 mg by mouth daily.         . divalproex (DEPAKOTE) 500 MG DR tablet   Oral   Take 4 tablets (2,000 mg total) by mouth at bedtime.   480 tablet   3   . fluticasone (FLONASE) 50 MCG/ACT nasal spray   Nasal   Place 2 sprays into the nose daily.   48 g   3   . Fluticasone-Salmeterol (ADVAIR DISKUS)  500-50 MCG/DOSE AEPB   Inhalation   Inhale 1 puff into the lungs every 12 (twelve) hours.   180 each   3   . lamoTRIgine (LAMICTAL) 200 MG tablet   Oral   Take 1 tablet (200 mg total) by mouth every morning.   90 tablet   3   . lisinopril-hydrochlorothiazide (PRINZIDE,ZESTORETIC) 20-12.5 MG per tablet   Oral   Take 1 tablet by mouth daily.   90 tablet   0   . neomycin-polymyxin-hydrocortisone (CORTISPORIN) otic solution   Left Ear   Place 3 drops into the left ear 4 (four) times daily.   10 mL   0   . silver sulfADIAZINE (SILVADENE) 1 % cream   Topical   Apply topically 2 (two) times daily.   50 g   0    BP 92/64  Pulse 86  Temp(Src) 98.8 F (37.1 C) (Oral)  Resp 18  Ht 5\' 5"  (1.651 m)  Wt 134 lb (60.782 kg)  BMI 22.3 kg/m2  SpO2 92% Physical Exam  Nursing note and vitals reviewed. Constitutional: He is oriented to person, place, and time. He appears well-developed and well-nourished.  HENT:  Head: Normocephalic and atraumatic.  Right Ear: External ear normal.  Left Ear: External ear normal.  Nose: Nose normal.  Mouth/Throat: Oropharynx is clear and moist. No oropharyngeal exudate.  Eyes: Conjunctivae and EOM are normal. Pupils are equal, round, and reactive to light.  Neck: Normal range of motion. Neck supple.  No spinal ttp.   Cardiovascular: Normal rate, regular rhythm, normal heart sounds and intact distal pulses.  Exam reveals no gallop and no friction rub.   No murmur heard. Pulmonary/Chest: Effort normal. No respiratory distress. He has wheezes (mild wheezing bilaterally. ). He exhibits tenderness (left lateral and posterior rib pain w/ palpation).  Abdominal: Soft. Bowel sounds are normal. He exhibits no distension. There is no tenderness. There is no rebound and no guarding.  Musculoskeletal: Normal range of motion. He exhibits no edema and no tenderness.  Neurological: He is alert and oriented to person, place, and time.  Skin: Skin is warm and dry.   Psychiatric: He has a normal mood and affect. His behavior is normal.    ED Course  Procedures (including critical care time) Labs Review Labs Reviewed  CBC WITH DIFFERENTIAL - Abnormal; Notable for the following:    MCH 34.5 (*)    All other components within normal limits  COMPREHENSIVE METABOLIC PANEL - Abnormal; Notable for the following:    Chloride 95 (*)    Glucose, Bld 117 (*)    BUN 5 (*)    Albumin 3.3 (*)  AST 69 (*)    Alkaline Phosphatase 119 (*)    All other components within normal limits  PRO B NATRIURETIC PEPTIDE - Abnormal; Notable for the following:    Pro B Natriuretic peptide (BNP) 182.2 (*)    All other components within normal limits  URINALYSIS, ROUTINE W REFLEX MICROSCOPIC   Imaging Review Dg Chest 2 View  08/29/2013   *RADIOLOGY REPORT*  Clinical Data: Larey Seat from toilet  CHEST - 2 VIEW  Comparison: 09/05/2012  Findings: Normal heart size.  No pleural effusion or edema identified.  No airspace consolidation identified.  The visualized bony thorax is significant for thoracic spondylosis.  IMPRESSION:  1.  No acute cardiopulmonary abnormalities.   Original Report Authenticated By: Signa Kell, M.D.   Dg Ribs Bilateral  08/29/2013   *RADIOLOGY REPORT*  Clinical Data: Left lateral and posterior rib pain  BILATERAL RIBS - 3+ VIEW  Comparison: None.  Findings: No displaced rib fractures are identified.  IMPRESSION:  1.  No acute findings.   Original Report Authenticated By: Signa Kell, M.D.     Date: 08/29/2013  Rate: 69  Rhythm: normal sinus rhythm  QRS Axis: indeterminate  Intervals: normal  ST/T Wave abnormalities: normal  Conduction Disutrbances:right bundle branch block and left anterior fascicular block  Narrative Interpretation: t wave in V2 now upright  Old EKG Reviewed: changes noted   MDM   1. COPD exacerbation   2. Pre-syncope   3. Fall, initial encounter   4. Contusion of ribs, left, initial encounter    1:08 AM 62 y.o. male with  history of coronary artery disease, aortic stenosis, asthma who presents with presyncopal symptoms and fall which occurred several hours ago. The patient states that he was using the bathroom and when he stood up he felt lightheaded and disoriented and fell to the ground hitting his left side. He denies hitting his head or having loss of consciousness. When his wife arrived several seconds later he was alert and oriented. The patient is afebrile here, initial blood pressure was 92/64 but blood pressure on exam now is 116/80. He denies any chest pain or shortness of breath. He does have wheezing on exam. Will get screening labs, orthostatics, IV fluid, pain control. The patient notes that he has had several presyncopal episodes with falls in the last few weeks. All these episodes have been when he went from the seated to standing position.  Will admit to hospitalist d/t hx of aortic stenosis here w/ wheezing/sob and new onset pre-syncopal episodes w/ falls.    Junius Argyle, MD 08/29/13 [phone]

## 2013-08-29 NOTE — Progress Notes (Signed)
ANTICOAGULATION CONSULT NOTE - Initial Consult  Pharmacy Consult for Heparin Indication: chest pain/ACS  Allergies  Allergen Reactions  . Codeine Anaphylaxis  . Amoxicillin     intolerance  . Augmentin [Amoxicillin-Pot Clavulanate]     nausea    Patient Measurements: Height: 5\' 5"  (165.1 cm) Weight: 134 lb (60.782 kg) IBW/kg (Calculated) : 61.5 Heparin Dosing Weight:   Vital Signs: Temp: 97.9 F (36.6 C) (09/08 1235) Temp src: Oral (09/08 1235) BP: 150/76 mmHg (09/08 1240) Pulse Rate: 103 (09/08 1240)  Labs:  Recent Labs  08/29/13 0100 08/29/13 0705 08/29/13 1312  HGB 15.9 13.6  --   HCT 45.0 39.5  --   PLT 151 131*  --   APTT  --   --  48*  LABPROT  --   --  13.0  INR  --   --  1.00  CREATININE 0.86 0.64 0.52  TROPONINI  --  <0.30 0.61*    Estimated Creatinine Clearance: 81.3 ml/min (by C-G formula based on Cr of 0.52).   Medical History: Past Medical History  Diagnosis Date  . Allergy   . Hypertension   . Bipolar disorder   . Alcohol abuse   . Tobacco use disorder   . Sleep apnea   . Asthma   . Aortic stenosis   . Neuromuscular disorder   . Family history of skin cancer   . Hyperlipidemia   . RBBB     a. 7.2012 low risk nuclear exam, sm area of apical ischemia.  . Carotid artery occlusion     ?  . Claudication   . Smoker   . Chronic back pain     Medications:  Scheduled:  . albuterol  2.5 mg Nebulization Q6H  . aspirin  81 mg Oral Daily  . atorvastatin  80 mg Oral q1800  . budesonide (PULMICORT) nebulizer solution  0.25 mg Nebulization BID  . divalproex  2,000 mg Oral QHS  . fluticasone  2 spray Each Nare Daily  . folic acid  1 mg Oral Daily  . heparin  2,000 Units Intravenous Once  . ipratropium  0.5 mg Nebulization Q6H  . lamoTRIgine  200 mg Oral q morning - 10a  . levofloxacin  750 mg Oral Daily  . methylPREDNISolone (SOLU-MEDROL) injection  60 mg Intravenous Q12H  . multivitamin with minerals  1 tablet Oral Daily  . [START ON  08/30/2013] pneumococcal 23 valent vaccine  0.5 mL Intramuscular Tomorrow-1000  . sodium chloride  3 mL Intravenous Q12H  . thiamine  100 mg Oral Daily   Or  . thiamine  100 mg Intravenous Daily   Infusions:  . sodium chloride    . heparin     PRN: acetaminophen, acetaminophen, albuterol, hydrALAZINE, LORazepam, LORazepam, ondansetron (ZOFRAN) IV, ondansetron, oxyCODONE-acetaminophen  Assessment: 62 yo M with ACS/Stemi. Cardiology requested Heparin after elevated troponin level (0.61) reported. Patient is hemodynamically stable and chest pain-free at the moment.  He has left sided rib pain and hx of right BBB noted in 2011 prior to hand surgery. Goal of Therapy:  Heparin level 0.3-0.7 units/ml Monitor platelets by anticoagulation protocol: Yes   Plan:  1. Heparin loading dose of 2000 units, followed by maintenance of 700 units/hour. 2. Check heparin level 6 hours after starting infusion (~ 2300)  3. Daily heparin level with CBC thereafter while on heparin.   Loletta Specter 08/29/2013,4:44 PM

## 2013-08-29 NOTE — Progress Notes (Signed)
Brief Summary 62 year old male with a history of chronic alcohol use, hypertension, bipolar disorder, seizure disorder, and asthma presents with presyncope his type symptoms with associated false in the past month. Urine drug screen was negative. The patient was placed on  CIWA as he began to exhibit signs of Etoh withdraw.  The patient fell and hit his right ribs. X-rays were negative for any fractures or pneumothorax. The patient was noted to have a soft blood pressures with systolics in the 90s. The patient will start intravenous fluids with some improvement. During the hospitalization, the patient was found to be increasingly hypoxemic requiring 50% Venturi mask. The patient was treated for COPD exacerbation with his long tobacco history. He was started on intravenous Solu-Medrol. Due to the patient's hypoxemia, tachycardia and elevated d-dimer, CT angiogram of the chest was negative for pulmonary embolus but revealed bilateral infiltrates suggestive of CAP. The patient was started on levofloxacin. The patient was noted to have elevated troponins. Radiology was consulted. They started the patient on heparin drip pending the trend of his troponins. Orthostatics are negative. Echocardiogram was Ordered. DTat

## 2013-08-29 NOTE — ED Notes (Signed)
Made aware of failing orthostatic VS- aware of bolus infusing and pending UA

## 2013-08-29 NOTE — Plan of Care (Cosign Needed)
Pt vomiting after PO Percocet. Made NPO- dc'd all PO meds except for bipolar meds since daily and has already taken for day. Does not have seizure d/o so continued Levaquin but converted to IV. Also resumed IVF at 75/hr. Apparently has "anaphylaxis" to codeine but ned to clarify with pt as to what narcotics he has tolerated in the past. Will give prn Toradol and add phenergan adjunct to Zofran.  Junious Silk, ANP

## 2013-08-29 NOTE — Plan of Care (Cosign Needed)
Received  Page from RN re: TNI 0.71- note prior TNI 0.61 Junious Silk, ANP

## 2013-08-29 NOTE — Progress Notes (Signed)
Called by RN about elevated troponin at 1420.  CTA chest ordered-->negative for PE, but showed bilateral infiltrates suggestive of pneumonia.  Suspect CAP.  Review of old EKGs showed pre-existing RBBB and LAFB.  Cardiology consulted for elevated troponin.  ??demand ischemia.  Will continue to trend troponin.  Pt is hemodynamically stable and chest pain free.  Cancel MRI brain for now, reschedule for tomorrow if pt remains stable. Orthostatics negative.  ABG ordered-->7.42/37/768/24 on 6 liters.  Keep pt on 6 liters for now.  Start levofloxacin 750mg  daily.  DTat

## 2013-08-29 NOTE — H&P (Signed)
[facility] History and Physical  Gregory [name] AVW:[id number] DOB: 06-Nov-1951 DOA: 08/29/2013  Referring physician: ER physician. PCP: Carollee Herter, MD   Chief Complaint: Dizziness and fall.  HPI: Gregory [name] is a 62 y.o. male with history of hypertension, bipolar disorder, asthma last night when he tried to get up from the commode he felt dizzy and fell. He hurt his chest. In the ER x-ray rib series does not show anything acute. Patient states that this is the third episode when he felt dizzy over the last 3 weeks. The first episode 3 weeks ago when he had gone to the beach and he felt very dizzy and was unable to ambulate. These episodes happen episodically. Denies any chest pain or productive cough fever chills nausea vomiting. He did have occasional diarrhea last episode was 4 days ago. In the ER patient was initially mildly hypotensive and improved with IV fluids. Patient's symptoms of dizziness have only been he stands up from a sitting position. Initially on presentation patient also was mildly wheezing which improved with nebulizer.  Review of Systems: As presented in the history of presenting illness, rest negative.  Past Medical History  Diagnosis Date  . Allergy   . Hypertension   . Bipolar disorder   . Alcohol abuse   . Tobacco use disorder   . Sleep apnea   . Asthma   . Peripheral vascular disease   . Aortic stenosis   . Heart murmur   . Neuromuscular disorder   . Family history of skin cancer   . Hyperlipidemia   . Coronary artery disease   . Carotid artery occlusion   . PAD (peripheral artery disease)   . Claudication   . Smoker    Past Surgical History  Procedure Laterality Date  . Colonoscopy  2009  . Vasectomy reversal    . US echocardiography  06/20/2010    EF 55-60%  . Anal fistulectomy  09/25/11  . Vasectomy    . Elbow surgery      bilaterally for cubital tunnel   Social History:  reports that he has been smoking Cigarettes.  He  has been smoking about 1.00 pack per day. He has never used smokeless tobacco. He reports that he drinks about 14.0 ounces of alcohol per week. He reports that he does not use illicit drugs. Home. where does patient live-- Can do ADLs. Can patient participate in ADLs?  Allergies  Allergen Reactions  . Codeine Anaphylaxis  . Amoxicillin     intolerance  . Augmentin [Amoxicillin-Pot Clavulanate]     nausea    Family History  Problem Relation Age of Onset  . Stroke Father 38  . Cancer Father     skin  . Hypertension Brother   . Diabetes Brother   . Cancer Maternal Aunt     skin  . Cancer Maternal Grandmother     liver      Prior to Admission medications   Medication Sig Start Date End Date Taking? Authorizing Provider  amLODipine (NORVASC) 5 MG tablet Take 1 tablet (5 mg total) by mouth daily. 11/29/12  Yes Ronnald Nian, MD  atorvastatin (LIPITOR) 20 MG tablet Take 1 tablet (20 mg total) by mouth daily. 11/29/12  Yes Ronnald Nian, MD  divalproex (DEPAKOTE) 500 MG DR tablet Take 4 tablets (2,000 mg total) by mouth at bedtime. 11/29/12  Yes Ronnald Nian, MD  fluticasone [facility]) 50 MCG/ACT nasal spray Place 2 sprays into the nose daily. 11/29/12  Yes  Ronnald Nian, MD  Fluticasone-Salmeterol (ADVAIR DISKUS) 500-50 MCG/DOSE AEPB Inhale 1 puff into the lungs every 12 (twelve) hours. 11/29/12  Yes Ronnald Nian, MD  lamoTRIgine (LAMICTAL) 200 MG tablet Take 1 tablet (200 mg total) by mouth every morning. 11/29/12  Yes Ronnald Nian, MD  lisinopril-hydrochlorothiazide (PRINZIDE,ZESTORETIC) 20-12.5 MG per tablet Take 1 tablet by mouth daily. 11/29/12  Yes Ronnald Nian, MD  Multiple Vitamin (MULTIVITAMIN WITH MINERALS) TABS tablet Take 1 tablet by mouth daily.   Yes Historical Provider, MD   Physical Exam: Filed Vitals:   08/29/13 0311 08/29/13 0330 08/29/13 0400 08/29/13 0401  BP: 126/69 108/60 103/58   Pulse: 94 95 98   Temp:      TempSrc:      Resp: 22 15 20    Height:       Weight:      SpO2: 90% 94% 91% 90%     General:  Well-developed well-nourished.  Eyes: Anicteric no pallor.  ENT: No discharge from ears eyes nose mouth.  Neck: No mass felt.  Cardiovascular: S1-S2 heard.  Respiratory: Mild expiratory wheeze heard. No crepitations.  Abdomen: Soft nontender bowel sounds present.  Skin: No rash.  Musculoskeletal: No edema.  Psychiatric: Appears normal.  Neurologic: Alert awake oriented to time place and person. Moves all extremities.  Labs on Admission:  Basic Metabolic Panel:  Recent Labs Lab 08/29/13 0100  NA 136  K 4.4  CL 95*  CO2 24  GLUCOSE 117*  BUN 5*  CREATININE 0.86  CALCIUM 8.8   Liver Function Tests:  Recent Labs Lab 08/29/13 0100  AST 69*  ALT 45  ALKPHOS 119*  BILITOT 0.3  PROT 7.7  ALBUMIN 3.3*   No results found for this basename: LIPASE, AMYLASE,  in the last 168 hours No results found for this basename: AMMONIA,  in the last 168 hours CBC:  Recent Labs Lab 08/29/13 0100  WBC 7.4  NEUTROABS 5.3  HGB 15.9  HCT 45.0  MCV 97.6  PLT 151   Cardiac Enzymes: No results found for this basename: CKTOTAL, CKMB, CKMBINDEX, TROPONINI,  in the last 168 hours  BNP (last 3 results)  Recent Labs  08/29/13 0100  PROBNP 182.2*   CBG: No results found for this basename: GLUCAP,  in the last 168 hours  Radiological Exams on Admission: Dg Chest 2 View  08/29/2013   *RADIOLOGY REPORT*  Clinical Data: Larey Seat from toilet  CHEST - 2 VIEW  Comparison: 09/05/2012  Findings: Normal heart size.  No pleural effusion or edema identified.  No airspace consolidation identified.  The visualized bony thorax is significant for thoracic spondylosis.  IMPRESSION:  1.  No acute cardiopulmonary abnormalities.   Original Report Authenticated By: Signa Kell, M.D.   Dg Ribs Bilateral  08/29/2013   *RADIOLOGY REPORT*  Clinical Data: Left lateral and posterior rib pain  BILATERAL RIBS - 3+ VIEW  Comparison: None.  Findings: No  displaced rib fractures are identified.  IMPRESSION:  1.  No acute findings.   Original Report Authenticated By: Signa Kell, M.D.    EKG: Independently reviewed. Normal sinus rhythm with RBBB.  Assessment/Plan Principal Problem:   Pre-syncope Active Problems:   Asthma   Fall   Contusion of ribs   1. Near-syncope - patient was initially mildly hypotensive so we will hold antihypertensives for now and gently hydrate. There is concern for possible murmur heard so we will get a 2-D echo. Check d-dimer. Patient has had a stress test  in 2012. 2. Asthma - patient was initially mildly wheezing presently on my exam wheezes are improved after nebulizer treatment. Continue with nebulizers and Pulmicort. 3. Hypertension -see #1. 4. History of bipolar disorder - continue present medications. Check Depakote levels.  5. Tobacco abuse - advised to quit smoking.    Code Status: Full code.  Family Communication: Patient's wife at the bedside.  Disposition Plan: Admit for observation.    Shayleen Eppinger N. [facility] Pager [phone].  If 7PM-7AM, please contact night-coverage [email or web address] Password [email or web address] 08/29/2013, 5:03 AM

## 2013-08-29 NOTE — ED Notes (Signed)
Bed: YN82 Expected date:  Expected time:  Means of arrival:  Comments: Bed 2

## 2013-08-29 NOTE — ED Notes (Addendum)
Pt states he became disoriented while on toilet tonight about 2330, fell hitting L side of chest and head on sink. Pt states he did the same 10 days ago and hit R side. Pt states he has had 3 of these episodes in last 3 weeks. Pt does appear confused in triage, repeating self and giving wrong answers per wife. Pupils are unequal, L larger than right.

## 2013-08-29 NOTE — Progress Notes (Signed)
*  PRELIMINARY RESULTS* Echocardiogram 2D Echocardiogram has been performed.  Gregory [name] 08/29/2013, 1:05 PM

## 2013-08-29 NOTE — Progress Notes (Signed)
ANTICOAGULATION CONSULT NOTE - Follow Up Consult  Pharmacy Consult for Heparin Indication: chest pain/ACS  Allergies  Allergen Reactions  . Codeine Anaphylaxis  . Amoxicillin     intolerance  . Augmentin [Amoxicillin-Pot Clavulanate]     nausea    Patient Measurements: Height: 5\' 5"  (165.1 cm) Weight: 134 lb (60.782 kg) IBW/kg (Calculated) : 61.5 Heparin Dosing Weight:   Vital Signs: Temp: 98.3 F (36.8 C) (09/08 2120) Temp src: Oral (09/08 2120) BP: 147/82 mmHg (09/08 2120) Pulse Rate: 86 (09/08 2136)  Labs:  Recent Labs  08/29/13 0100 08/29/13 0705 08/29/13 1312 08/29/13 1845 08/29/13 2258  HGB 15.9 13.6  --   --   --   HCT 45.0 39.5  --   --   --   PLT 151 131*  --   --   --   APTT  --   --  48*  --   --   LABPROT  --   --  13.0  --   --   INR  --   --  1.00  --   --   HEPARINUNFRC  --   --   --   --  0.19*  CREATININE 0.86 0.64 0.52  --   --   TROPONINI  --  <0.30 0.61* 0.71*  --     Estimated Creatinine Clearance: 81.3 ml/min (by C-G formula based on Cr of 0.52).   Medications:  Infusions:  . sodium chloride    . heparin      Assessment: Patient with heparin level low.  Some blood clots note by RN in pt's urine.  No active bleeding noted.    Goal of Therapy:  Heparin level 0.3-0.7 units/ml Monitor platelets by anticoagulation protocol: Yes   Plan:  Increase heparin to 850 units/hr, recheck level at 0800 9/9  Aleene Davidson Crowford 08/29/2013,11:39 PM

## 2013-08-29 NOTE — Progress Notes (Signed)
CRITICAL VALUE ALERT  Critical value received:  Troponin 0.61  Date of notification:  08/29/13  Time of notification:  1420  Critical value read back:yes  Nurse who received alert:  Myrlene Broker  MD notified (1st page):  Dr. Arbutus Leas  Time of first page:  1421  MD notified (2nd page):  Time of second page:  Responding MD:  Dr. Arbutus Leas  Time MD responded:  [phone]

## 2013-08-29 NOTE — Progress Notes (Signed)
[facility] PROGRESS NOTE  Gregory [name] ZOX:[id number] DOB: [birth year] DOA: 08/29/2013 PCP: Carollee Herter, MD  Assessment/Plan: Acute hypoxemic respiratory failure -Likely due to his COPD exacerbation, rule out pulmonary embolus -ABG on 50% Venturi mask -CT angiogram chest--patient had elevated d-dimer COPD exacerbation  -Start Solu-Medrol -Aerosolized albuterol and Atrovent -Continue budesonide nebulizer -Start levofloxacin Gait instability/dizziness/frequent falls -May be due to the patient's chronic alcohol use manifestations versus neurologic etiology vs volume depletion vs hypotension and hypoxemia -Check serum B12, TSH -MRI brain  -Orthostatics  -Urine drug screen  -Echocardiogram Bipolar disorder  -Continue Lamictal and Depakote  Chronic alcohol dependence  -Alcohol withdrawal protocol  Transaminasemia -d/c lipitor for now -likely related to Etoh and Depakote Hypertension  -Patient was borderline hypotensive  -Discontinued amlodipine and lisinopril hydrochlorothiazide  -IV fluids   Family Communication:   wife at beside Disposition Plan:   Home when medically stable       Procedures/Studies: Dg Chest 2 View  08/29/2013   *RADIOLOGY REPORT*  Clinical Data: Larey Seat from toilet  CHEST - 2 VIEW  Comparison: 09/05/2012  Findings: Normal heart size.  No pleural effusion or edema identified.  No airspace consolidation identified.  The visualized bony thorax is significant for thoracic spondylosis.  IMPRESSION:  1.  No acute cardiopulmonary abnormalities.   Original Report Authenticated By: Signa Kell, M.D.   Dg Ribs Bilateral  08/29/2013   *RADIOLOGY REPORT*  Clinical Data: Left lateral and posterior rib pain  BILATERAL RIBS - 3+ VIEW  Comparison: None.  Findings: No displaced rib fractures are identified.  IMPRESSION:  1.  No acute findings.   Original Report Authenticated By: Signa Kell, M.D.   Dg Pelvis 1-2 Views  08/29/2013   *RADIOLOGY REPORT*   Clinical Data: The patient fell onto the buttocks last night and now complains of low back pain.  PELVIS - 1-2 VIEW  Comparison: 06/28/2009  Findings: Degenerative changes in the lower lumbar spine and hips. Pelvis and hips appear intact.  No displaced fractures are identified.  No focal bone lesions.  SI joints, symphysis pubis, and pelvic rim are not displaced.  Vascular calcifications.  IMPRESSION: Degenerative changes in the lower lumbar spine and hips.  No displaced fractures identified.   Original Report Authenticated By: Burman Nieves, M.D.         Subjective: Patient feels tremulous. Denies any headache, dizziness,  Nausea, vomiting, some shortness of breath. No hemoptysis.  diarrhea. He does have right-sided rib pain. there is no hemoptysis. Denies fevers, chills, headache .  Objective: Filed Vitals:   08/29/13 0401 08/29/13 0640 08/29/13 1055 08/29/13 1235  BP:  135/66  144/77  Pulse:  101  89  Temp:    97.9 F (36.6 C)  TempSrc:    Oral  Resp:  22  18  Height:      Weight:      SpO2: 90% 91% 94%     Intake/Output Summary (Last 24 hours) at 08/29/13 1244 Last data filed at 08/29/13 1008  Gross per 24 hour  Intake     63 ml  Output    100 ml  Net    -37 ml   Weight change:  Exam:   General:  Pt is alert, follows commands appropriately, not in acute distress  HEENT: No icterus, No thrush,  [place]/AT  Cardiovascular: RRR, S1/S2, no rubs, no gallops  Respiratory: Scattered wheezes bilaterally. Diminished breath sounds left greater than right base  Abdomen: Soft/+BS, non tender, non distended, no guarding  Extremities: No  edema, No lymphangitis, No petechiae, No rashes, no synovitis  Data Reviewed: Basic Metabolic Panel:  Recent Labs Lab 08/29/13 0100 08/29/13 0705  NA 136  --   K 4.4  --   CL 95*  --   CO2 24  --   GLUCOSE 117*  --   BUN 5*  --   CREATININE 0.86 0.64  CALCIUM 8.8  --    Liver Function Tests:  Recent Labs Lab 08/29/13 0100  AST  69*  ALT 45  ALKPHOS 119*  BILITOT 0.3  PROT 7.7  ALBUMIN 3.3*   No results found for this basename: LIPASE, AMYLASE,  in the last 168 hours No results found for this basename: AMMONIA,  in the last 168 hours CBC:  Recent Labs Lab 08/29/13 0100 08/29/13 0705  WBC 7.4 5.3  NEUTROABS 5.3  --   HGB 15.9 13.6  HCT 45.0 39.5  MCV 97.6 99.2  PLT 151 131*   Cardiac Enzymes:  Recent Labs Lab 08/29/13 0705  TROPONINI <0.30   BNP: No components found with this basename: POCBNP,  CBG: No results found for this basename: GLUCAP,  in the last 168 hours  No results found for this or any previous visit (from the past 240 hour(s)).   Scheduled Meds: . albuterol  2.5 mg Nebulization Q6H  . atorvastatin  20 mg Oral Daily  . budesonide (PULMICORT) nebulizer solution  0.25 mg Nebulization BID  . divalproex  2,000 mg Oral QHS  . enoxaparin (LOVENOX) injection  40 mg Subcutaneous Q24H  . fluticasone  2 spray Each Nare Daily  . folic acid  1 mg Oral Daily  . lamoTRIgine  200 mg Oral q morning - 10a  . methylPREDNISolone (SOLU-MEDROL) injection  60 mg Intravenous Q12H  . multivitamin with minerals  1 tablet Oral Daily  . sodium chloride  3 mL Intravenous Q12H  . thiamine  100 mg Oral Daily   Or  . thiamine  100 mg Intravenous Daily   Continuous Infusions: . sodium chloride       Myron Lona, DO  [facility] Pager [phone]  If 7PM-7AM, please contact night-coverage [email or web address] Password [email or web address] 08/29/2013, 12:44 PM   LOS: 0 days

## 2013-08-29 NOTE — Progress Notes (Signed)
Patient C/O pain on the right rib area and burning with urination, noted pinkish bloody urine output and a clot, urine sample sent and Dr. Arbutus Leas notified. Will continue to assess patient.

## 2013-08-29 NOTE — Consult Note (Signed)
CARDIOLOGY CONSULT NOTE  Patient ID: Gregory [name] MRN: [id number], DOB/AGE: 07-30-1951   Admit date: 08/29/2013 Date of Consult: 08/29/2013  Primary Physician: Carollee Herter, MD Primary Cardiologist: P. [place], MD   Pt. Profile  62 year old male without prior cardiac history who was admitted last night with orthostasis and left-sided rib pain and has subsequently been found to have right-sided pneumonia and elevated troponin.  Problem List  Past Medical History  Diagnosis Date  . Allergy   . Hypertension   . Bipolar disorder   . Alcohol abuse   . Tobacco use disorder   . Sleep apnea   . Asthma   . Aortic stenosis   . Neuromuscular disorder   . Family history of skin cancer   . Hyperlipidemia   . RBBB     a. 7.2012 low risk nuclear exam, sm area of apical ischemia.  . Carotid artery occlusion     ?  . Claudication   . Smoker   . Chronic back pain     Past Surgical History  Procedure Laterality Date  . Colonoscopy  2009  . Vasectomy reversal    . US echocardiography  06/20/2010    EF 55-60%  . Anal fistulectomy  09/25/11  . Vasectomy    . Elbow surgery      bilaterally for cubital tunnel     Allergies  Allergies  Allergen Reactions  . Codeine Anaphylaxis  . Amoxicillin     intolerance  . Augmentin [Amoxicillin-Pot Clavulanate]     nausea    HPI   62 year old male without prior cardiac history. He does have a history of right bundle branch block which was apparently first noted in 2011 prior to having a hand surgery. As result, he was referred to cardiology and subsequently underwent stress testing which was low risk.  He has never undergone stress testing or catheterization as far as he knows. He denies experiencing chest pain or dyspnea at home though admits that his activity is quite limited secondary to chronic low back pain. He does have a history of orthostasis, becoming lightheaded if he stands up too quickly. Last night, he was in the bathroom  and stood up afterwards and felt lightheaded.  He swayed forward and ended up striking his left upper chest on the corner of a sink. With this, he experienced excruciating left-sided rib pain and thought perhaps that he had broken his ribs. He informed his wife then brought him into [facility] long ED. Here, ECG was nonacute and and initial laboratory evaluation was unrevealing. He was noted to be wheezing on exam and was treated with nebulizer therapy. Films of his ribs did not show an acute fracture. D-dimer was markedly elevated at 2.2. He was admitted for further evaluation and subsequently underwent CT MG of his chest which was negative for pulmonary embolism but did show significant consolidation of the right base suggestive of pneumonia. His been treated with inhalers, steroids, and now antibiotics. As part of his workup for presyncope, and echocardiogram was performed however this is yet to be read. Further, troponin was sent and returned elevated at 0.61, after initially being normal. We have been asked to evaluate. Other than left-sided rib pain and chest wall tenderness, he denies chest pain. He further denies dyspnea at this time.  Inpatient Medications  . albuterol  2.5 mg Nebulization Q6H  . budesonide (PULMICORT) nebulizer solution  0.25 mg Nebulization BID  . divalproex  2,000 mg Oral QHS  . enoxaparin (LOVENOX)  injection  40 mg Subcutaneous Q24H  . fluticasone  2 spray Each Nare Daily  . folic acid  1 mg Oral Daily  . ipratropium  0.5 mg Nebulization Q6H  . lamoTRIgine  200 mg Oral q morning - 10a  . levofloxacin  750 mg Oral Daily  . methylPREDNISolone (SOLU-MEDROL) injection  60 mg Intravenous Q12H  . multivitamin with minerals  1 tablet Oral Daily  . [START ON 08/30/2013] pneumococcal 23 valent vaccine  0.5 mL Intramuscular Tomorrow-1000  . sodium chloride  3 mL Intravenous Q12H  . thiamine  100 mg Oral Daily   Or  . thiamine  100 mg Intravenous Daily    Family History Family  History  Problem Relation Age of Onset  . Stroke Father 84  . Cancer Father     skin  . Hypertension Brother   . Diabetes Brother   . Cancer Maternal Aunt     skin  . Cancer Maternal Grandmother     liver     Social History History   Social History  . Marital Status: Married    Spouse Name: N/A    Number of Children: N/A  . Years of Education: N/A   Occupational History  . Not on file.   Social History Main Topics  . Smoking status: Current Every Day Smoker -- 1.00 packs/day    Types: Cigarettes  . Smokeless tobacco: Never Used  . Alcohol Use: 14.0 oz/week    28 drink(s) per week     Comment: 2 glasses of wine per night.  . Drug Use: No  . Sexual Activity: Yes   Other Topics Concern  . Not on file   Social History Narrative  . No narrative on file     Review of Systems  General:  No chills, fever, night sweats or weight changes.  Cardiovascular:  +++ left sided rib and chest wall pain.  He has some degree of chronic orthostasis with standing too quickly but has noted more frequent episodes recently.  No dyspnea on exertion, edema, orthopnea, palpitations, paroxysmal nocturnal dyspnea. Dermatological: No rash, lesions/masses Respiratory: +++ cough 3-5 days ago.  No dyspnea Urologic: No hematuria, dysuria Abdominal:   No nausea, vomiting, diarrhea, bright red blood per rectum, melena, or hematemesis Neurologic:  No visual changes, wkns, changes in mental status. MSK:  Chronic lbp, which limits mobility. All other systems reviewed and are otherwise negative except as noted above.  Physical Exam  Blood pressure 150/76, pulse 103, temperature 97.9 F (36.6 C), temperature source Oral, resp. rate 22, height 5\' 5"  (1.651 m), weight 134 lb (60.782 kg), SpO2 94.00%.  General: Pleasant, NAD Psych: Normal affect. Neuro: Alert and oriented X 3. Moves all extremities spontaneously. HEENT: Normal  Neck: Supple without JVD.  bilat radiated murmur noted. Lungs:  Resp  regular and unlabored, markedly diminished R base/mid.  Faint exp wheezing noted bilat, worse on right. Heart: RRR no s3, s4, or murmurs. 2/6 systolic m RUSB.  Abdomen: Soft, non-tender, non-distended, BS + x 4.  Extremities: No clubbing, cyanosis or edema. DP/PT/Radials 2+ and equal bilaterally.  Labs   Recent Labs  08/29/13 0705 08/29/13 1312  TROPONINI <0.30 0.61*   Lab Results  Component Value Date   WBC 5.3 08/29/2013   HGB 13.6 08/29/2013   HCT 39.5 08/29/2013   MCV 99.2 08/29/2013   PLT 131* 08/29/2013    Recent Labs Lab 08/29/13 0100  08/29/13 1312  NA 136  --  133*  K 4.4  --  3.7  CL 95*  --  93*  CO2 24  --  26  BUN 5*  --  6  CREATININE 0.86  < > 0.52  CALCIUM 8.8  --  8.8  PROT 7.7  --   --   BILITOT 0.3  --   --   ALKPHOS 119*  --   --   ALT 45  --   --   AST 69*  --   --   GLUCOSE 117*  --  227*  < > = values in this interval not displayed. Lab Results  Component Value Date   CHOL 153 11/25/2012   HDL 67 11/25/2012   LDLCALC 72 11/25/2012   TRIG 68 11/25/2012   Lab Results  Component Value Date   DDIMER 2.20* 08/29/2013    Radiology/Studies  Dg Chest 2 View  08/29/2013   *RADIOLOGY REPORT*  Clinical Data: Larey Seat from toilet  CHEST - 2 VIEW  Comparison: 09/05/2012  Findings: Normal heart size.  No pleural effusion or edema identified.  No airspace consolidation identified.  The visualized bony thorax is significant for thoracic spondylosis.  IMPRESSION:  1.  No acute cardiopulmonary abnormalities.   Original Report Authenticated By: Signa Kell, M.D.   Dg Ribs Bilateral  08/29/2013   *RADIOLOGY REPORT*  Clinical Data: Left lateral and posterior rib pain  BILATERAL RIBS - 3+ VIEW  Comparison: None.  Findings: No displaced rib fractures are identified.  IMPRESSION:  1.  No acute findings.   Original Report Authenticated By: Signa Kell, M.D.   Dg Pelvis 1-2 Views  08/29/2013   *RADIOLOGY REPORT*  Clinical Data: The patient fell onto the buttocks last night and  now complains of low back pain.  PELVIS - 1-2 VIEW  Comparison: 06/28/2009  Findings: Degenerative changes in the lower lumbar spine and hips. Pelvis and hips appear intact.  No displaced fractures are identified.  No focal bone lesions.  SI joints, symphysis pubis, and pelvic rim are not displaced.  Vascular calcifications.  IMPRESSION: Degenerative changes in the lower lumbar spine and hips.  No displaced fractures identified.   Original Report Authenticated By: Burman Nieves, M.D.    ECG  Rsr, 69, rbbb, lafb - no acute st/t changes.  ASSESSMENT AND PLAN  See below.  Signed, Nicolasa Ducking, NP 08/29/2013, 4:11 PM  Patient seen with NP, agree with the above note.    Patient was admitted after standing up abruptly in bathroom, getting lightheaded, and falling/hitting left side.  He was found to be hypoxic and to have a right-sided PNA (PE CT negative for PE but showed PNA).  He actually denies dyspnea throughout though oxygen level has been low and he has an impressive PNA.  He denies chest pain. Cardiac enzymes were cycled, apparently due to presyncopal episode, and TnI was found to be mildly elevated.   ECG with LAFB/RBBB, NSR.  He currently says that he feels "fine."  Possible demand ischemia with mild troponin elevation in the setting of hypoxemia, but cannot rule out ACS with plaque rupture.   For now, would treat PNA.  Cycle cardiac enzymes to peak and get echo.  Would treat with ASA 81, statin, and heparin gtt for now.  If troponin has significant upward trend or there are wall motion abnormalities on echo, could consider cardiac cath after he is treated for PNA.  If enzyme trend is flat/decreasing and echo is normal, can do outpatient Cardiolite after he recovers from PNA.   Marca Ancona 08/29/2013  4:19 PM

## 2013-08-29 NOTE — Plan of Care (Cosign Needed)
Received call back from RN. Clarified pt had coughing spell that lead to emesis. Pt endorsing pleuritic type pain. Renal function normal so will continue Toradol until pt can be re-evaluated by am rounding team. Pharmacy had also called re: pt documented codeine allergy with anaphylaxis. Pt told RN that did have "throat closing" with codeine but has tolerated synthetic codeines in the past. Agaim, will continue with non-narcotic pain meds until can be evaluated by am rounding team. Will allow clears.  Junious Silk, ANP

## 2013-08-29 NOTE — ED Notes (Signed)
Pt's O2 sat at around 89% on [place] at 6L.. Dr.Harrison made aware.

## 2013-08-30 DIAGNOSIS — J441 Chronic obstructive pulmonary disease with (acute) exacerbation: Secondary | ICD-10-CM

## 2013-08-30 DIAGNOSIS — J9600 Acute respiratory failure, unspecified whether with hypoxia or hypercapnia: Secondary | ICD-10-CM

## 2013-08-30 DIAGNOSIS — F1020 Alcohol dependence, uncomplicated: Secondary | ICD-10-CM

## 2013-08-30 DIAGNOSIS — J189 Pneumonia, unspecified organism: Principal | ICD-10-CM

## 2013-08-30 LAB — CBC
HCT: 40.7 % (ref 39.0–52.0)
Hemoglobin: 13.9 g/dL (ref 13.0–17.0)
MCH: 33.5 pg (ref 26.0–34.0)
MCHC: 34.2 g/dL (ref 30.0–36.0)
MCV: 98.1 fL (ref 78.0–100.0)
Platelets: 132 10*3/uL — ABNORMAL LOW (ref 150–400)
RBC: 4.15 MIL/uL — ABNORMAL LOW (ref 4.22–5.81)
RDW: 13.8 % (ref 11.5–15.5)
WBC: 11.2 10*3/uL — ABNORMAL HIGH (ref 4.0–10.5)

## 2013-08-30 LAB — BASIC METABOLIC PANEL
BUN: 9 mg/dL (ref 6–23)
CO2: 29 mEq/L (ref 19–32)
Calcium: 8.2 mg/dL — ABNORMAL LOW (ref 8.4–10.5)
Chloride: 97 mEq/L (ref 96–112)
Creatinine, Ser: 0.53 mg/dL (ref 0.50–1.35)
GFR calc Af Amer: >90 mL/min (ref >90)
GFR calc non Af Amer: >90 mL/min (ref >90)
Glucose, Bld: 124 mg/dL — ABNORMAL HIGH (ref 70–99)
Potassium: 4.1 mEq/L (ref 3.5–5.1)
Sodium: 133 mEq/L — ABNORMAL LOW (ref 135–145)

## 2013-08-30 LAB — TROPONIN I: Troponin I: 0.61 ng/mL (ref <0.30)

## 2013-08-30 LAB — LEGIONELLA ANTIGEN, URINE: Legionella Antigen, Urine: NEGATIVE

## 2013-08-30 LAB — HEPARIN LEVEL (UNFRACTIONATED)
Heparin Unfractionated: 0.16 IU/mL — ABNORMAL LOW (ref 0.30–0.70)
Heparin Unfractionated: 0.2 IU/mL — ABNORMAL LOW (ref 0.30–0.70)

## 2013-08-30 MED ORDER — ALBUTEROL SULFATE (5 MG/ML) 0.5% IN NEBU
2.5000 mg | INHALATION_SOLUTION | Freq: Three times a day (TID) | RESPIRATORY_TRACT | Status: DC
  Administered 2013-08-31: 2.5 mg via RESPIRATORY_TRACT
  Filled 2013-08-30: qty 0.5

## 2013-08-30 MED ORDER — MOMETASONE FURO-FORMOTEROL FUM 200-5 MCG/ACT IN AERO
2.0000 | INHALATION_SPRAY | Freq: Two times a day (BID) | RESPIRATORY_TRACT | Status: DC
  Administered 2013-08-30 – 2013-09-01 (×4): 2 via RESPIRATORY_TRACT
  Filled 2013-08-30: qty 8.8

## 2013-08-30 MED ORDER — DIVALPROEX SODIUM 125 MG PO CPSP
2000.0000 mg | ORAL_CAPSULE | Freq: Every day | ORAL | Status: DC
  Administered 2013-08-30 – 2013-08-31 (×2): 2000 mg via ORAL
  Filled 2013-08-30 (×3): qty 16

## 2013-08-30 MED ORDER — DOCUSATE SODIUM 100 MG PO CAPS
100.0000 mg | ORAL_CAPSULE | Freq: Two times a day (BID) | ORAL | Status: DC
  Administered 2013-08-30: 100 mg via ORAL
  Filled 2013-08-30 (×5): qty 1

## 2013-08-30 MED ORDER — SENNA 8.6 MG PO TABS
2.0000 | ORAL_TABLET | Freq: Every day | ORAL | Status: DC
  Administered 2013-08-30: 17.2 mg via ORAL
  Filled 2013-08-30 (×2): qty 2

## 2013-08-30 MED ORDER — INFLUENZA VAC SPLIT QUAD 0.5 ML IM SUSP
0.5000 mL | INTRAMUSCULAR | Status: AC
  Filled 2013-08-30 (×2): qty 0.5

## 2013-08-30 MED ORDER — AMLODIPINE BESYLATE 5 MG PO TABS
5.0000 mg | ORAL_TABLET | Freq: Every day | ORAL | Status: DC
  Administered 2013-08-30 – 2013-08-31 (×2): 5 mg via ORAL
  Filled 2013-08-30 (×3): qty 1

## 2013-08-30 MED ORDER — HEPARIN (PORCINE) IN NACL 100-0.45 UNIT/ML-% IJ SOLN
1050.0000 [IU]/h | INTRAMUSCULAR | Status: DC
  Filled 2013-08-30: qty 250

## 2013-08-30 MED ORDER — IPRATROPIUM BROMIDE 0.02 % IN SOLN
0.5000 mg | Freq: Three times a day (TID) | RESPIRATORY_TRACT | Status: DC
  Administered 2013-08-31: 0.5 mg via RESPIRATORY_TRACT
  Filled 2013-08-30: qty 2.5

## 2013-08-30 MED ORDER — PREDNISONE 50 MG PO TABS
50.0000 mg | ORAL_TABLET | Freq: Every day | ORAL | Status: DC
  Administered 2013-08-31: 50 mg via ORAL
  Filled 2013-08-30 (×2): qty 1

## 2013-08-30 MED ORDER — LEVOFLOXACIN 750 MG PO TABS
750.0000 mg | ORAL_TABLET | ORAL | Status: DC
  Administered 2013-08-30 – 2013-08-31 (×2): 750 mg via ORAL
  Filled 2013-08-30 (×3): qty 1

## 2013-08-30 MED ORDER — HEPARIN (PORCINE) IN NACL 100-0.45 UNIT/ML-% IJ SOLN
1200.0000 [IU]/h | INTRAMUSCULAR | Status: DC
  Administered 2013-08-30: 1200 [IU]/h via INTRAVENOUS
  Filled 2013-08-30: qty 250

## 2013-08-30 NOTE — Progress Notes (Signed)
ANTICOAGULATION CONSULT NOTE - Follow Up  Pharmacy Consult for Heparin Indication: chest pain/ACS  Allergies  Allergen Reactions  . Codeine Anaphylaxis  . Amoxicillin     intolerance  . Augmentin [Amoxicillin-Pot Clavulanate]     nausea    Patient Measurements: Height: 5\' 5"  (165.1 cm) Weight: 136 lb 3.9 oz (61.8 kg) IBW/kg (Calculated) : 61.5  Vital Signs: Temp: 98.3 F (36.8 C) (09/09 1209) Temp src: Oral (09/09 1209) BP: 159/76 mmHg (09/09 1209) Pulse Rate: 77 (09/09 1209)  Labs:  Recent Labs  08/29/13 0100 08/29/13 0705 08/29/13 1312 08/29/13 1845 08/29/13 2258 08/30/13 0800 08/30/13 1646  HGB 15.9 13.6  --   --   --  13.9  --   HCT 45.0 39.5  --   --   --  40.7  --   PLT 151 131*  --   --   --  132*  --   APTT  --   --  48*  --   --   --   --   LABPROT  --   --  13.0  --   --   --   --   INR  --   --  1.00  --   --   --   --   HEPARINUNFRC  --   --   --   --  0.19* 0.16* 0.20*  CREATININE 0.86 0.64 0.52  --   --  0.53  --   TROPONINI  --  <0.30 0.61* 0.71*  --   --   --     Estimated Creatinine Clearance: 82.2 ml/min (by C-G formula based on Cr of 0.53).   Medications:  Scheduled:  . albuterol  2.5 mg Nebulization Q6H  . amLODipine  5 mg Oral Daily  . aspirin  81 mg Oral Daily  . divalproex  2,000 mg Oral QHS  . docusate sodium  100 mg Oral BID  . fluticasone  2 spray Each Nare Daily  . heparin  2,000 Units Intravenous Once  . [START ON 08/31/2013] influenza vac split quadrivalent PF  0.5 mL Intramuscular Tomorrow-1000  . ipratropium  0.5 mg Nebulization Q6H  . lamoTRIgine  200 mg Oral q morning - 10a  . levofloxacin  750 mg Oral Q24H  . mometasone-formoterol  2 puff Inhalation BID  . multivitamin with minerals  1 tablet Oral Daily  . [START ON 08/31/2013] predniSONE  50 mg Oral Q breakfast  . senna  2 tablet Oral Daily  . sodium chloride  3 mL Intravenous Q12H  . thiamine  100 mg Oral Daily   Or  . thiamine  100 mg Intravenous Daily    Infusions:  . sodium chloride 75 mL/hr at 08/30/13 1216  . heparin     PRN: acetaminophen, acetaminophen, albuterol, LORazepam, LORazepam, ondansetron (ZOFRAN) IV, ondansetron, promethazine  Assessment: 62 yo M with ACS/Stemi. Cardiology requested Heparin 9/8 pm after elevated troponin level (0.61) reported. Per cards this am, elevated troponin likely due to demand ischemia. Patient has left sided rib pain and hx of right BBB noted in 2011 prior to hand surgery.  Heparin level this pm remains subtherapeutic. Currently infusing at 1050 units/hr  No further blood clots reported in urine  Goal of Therapy:  Heparin level 0.3-0.7 units/ml Monitor platelets by anticoagulation protocol: Yes   Plan:  1. Increase heparin to 1200 units/hr  2. Recheck heparin level in 6 hours  3. Daily heparin level and CBC.  Terrilee Files, PharmD  08/30/2013,5:25 PM

## 2013-08-30 NOTE — Progress Notes (Signed)
    SUBJECTIVE: Only complaining of side and back pain. No chest pain.   BP 139/77  Pulse 64  Temp(Src) 98.1 F (36.7 C) (Oral)  Resp 18  Ht 5\' 5"  (1.651 m)  Wt 136 lb 3.9 oz (61.8 kg)  BMI 22.67 kg/m2  SpO2 98%  Intake/Output Summary (Last 24 hours) at 08/30/13 4098 Last data filed at 08/30/13 0900  Gross per 24 hour  Intake  959.1 ml  Output    650 ml  Net  309.1 ml    PHYSICAL EXAM General: Well developed, well nourished, in no acute distress. Alert and oriented x 3.  Psych:  Good affect, responds appropriately Neck: No JVD. No masses noted.  Lungs: Clear bilaterally with no wheezes or rhonci noted.  Heart: RRR with no murmurs noted. Abdomen: Bowel sounds are present. Soft, non-tender.  Extremities: No lower extremity edema.   LABS: Basic Metabolic Panel:  Recent Labs  11/91/47 1312 08/30/13 0800  NA 133* 133*  K 3.7 4.1  CL 93* 97  CO2 26 29  GLUCOSE 227* 124*  BUN 6 9  CREATININE 0.52 0.53  CALCIUM 8.8 8.2*   CBC:  Recent Labs  08/29/13 0100 08/29/13 0705 08/30/13 0800  WBC 7.4 5.3 11.2*  NEUTROABS 5.3  --   --   HGB 15.9 13.6 13.9  HCT 45.0 39.5 40.7  MCV 97.6 99.2 98.1  PLT 151 131* 132*   Cardiac Enzymes:  Recent Labs  08/29/13 0705 08/29/13 1312 08/29/13 1845  TROPONINI <0.30 0.61* 0.71*    Current Meds: . albuterol  2.5 mg Nebulization Q6H  . aspirin  81 mg Oral Daily  . budesonide (PULMICORT) nebulizer solution  0.25 mg Nebulization BID  . divalproex  2,000 mg Oral QHS  . fluticasone  2 spray Each Nare Daily  . heparin  2,000 Units Intravenous Once  . ipratropium  0.5 mg Nebulization Q6H  . lamoTRIgine  200 mg Oral q morning - 10a  . levofloxacin (LEVAQUIN) IV  750 mg Intravenous Q24H  . methylPREDNISolone (SOLU-MEDROL) injection  60 mg Intravenous Q12H  . multivitamin with minerals  1 tablet Oral Daily  . pneumococcal 23 valent vaccine  0.5 mL Intramuscular Tomorrow-1000  . sodium chloride  3 mL Intravenous Q12H  .  thiamine  100 mg Oral Daily   Or  . thiamine  100 mg Intravenous Daily   Echo 08/30/13: Left ventricle: The cavity size was normal. Systolic function was normal. The estimated ejection fraction was in the range of 55% to 60%. Wall motion was normal; there were no regional wall motion abnormalities. Features are consistent with a pseudonormal left ventricular filling pattern, with concomitant abnormal relaxation and increased filling pressure (grade 2 diastolic dysfunction). - Aortic valve: Moderate thickening and calcification. There was moderate stenosis. Valve area: 1.13cm^2(VTI). Valve area: 1.12cm^2 (Vmax).  ASSESSMENT AND PLAN:  1. Pneumonia: Per primary team.   2. Slight elevation troponin: Likely due to demand ischemia. Cardiac markers trend is flat. Echo with no wall motion abnormalities. Would recommend outpatient stress test when he recovers from his acute illness. We will follow and make further recommendations.   3. Moderate aortic valve stenosis: Will follow. Should have no hemodynamic significance.    MCALHANY,CHRISTOPHER  9/9/20149:21 AM

## 2013-08-30 NOTE — Progress Notes (Signed)
CRITICAL VALUE ALERT  Critical value received:  Trop 0.71  Date of notification:  08/29/13  Time of notification:  19:55  Critical value read back:yes  Nurse who received alert:  Lorretta Harp  MD notified (1st page):  Weston Settle, NP  Time of first page:  19:55  MD notified (2nd page):  Time of second page:  Responding MD:  Weston Settle, NP  Time MD responded:  19:59 via note in chart

## 2013-08-30 NOTE — Progress Notes (Signed)
ANTICOAGULATION CONSULT NOTE - Follow Up  Pharmacy Consult for Heparin Indication: chest pain/ACS  Allergies  Allergen Reactions  . Codeine Anaphylaxis  . Amoxicillin     intolerance  . Augmentin [Amoxicillin-Pot Clavulanate]     nausea    Patient Measurements: Height: 5\' 5"  (165.1 cm) Weight: 136 lb 3.9 oz (61.8 kg) IBW/kg (Calculated) : 61.5  Vital Signs: Temp: 98.1 F (36.7 C) (09/09 0647) Temp src: Oral (09/09 0647) BP: 139/77 mmHg (09/09 0647) Pulse Rate: 64 (09/09 0647)  Labs:  Recent Labs  08/29/13 0100 08/29/13 0705 08/29/13 1312 08/29/13 1845 08/29/13 2258 08/30/13 0800  HGB 15.9 13.6  --   --   --  13.9  HCT 45.0 39.5  --   --   --  40.7  PLT 151 131*  --   --   --  132*  APTT  --   --  48*  --   --   --   LABPROT  --   --  13.0  --   --   --   INR  --   --  1.00  --   --   --   HEPARINUNFRC  --   --   --   --  0.19* 0.16*  CREATININE 0.86 0.64 0.52  --   --  0.53  TROPONINI  --  <0.30 0.61* 0.71*  --   --     Estimated Creatinine Clearance: 82.2 ml/min (by C-G formula based on Cr of 0.53).   Medications:  Scheduled:  . albuterol  2.5 mg Nebulization Q6H  . aspirin  81 mg Oral Daily  . budesonide (PULMICORT) nebulizer solution  0.25 mg Nebulization BID  . divalproex  2,000 mg Oral QHS  . fluticasone  2 spray Each Nare Daily  . heparin  2,000 Units Intravenous Once  . ipratropium  0.5 mg Nebulization Q6H  . lamoTRIgine  200 mg Oral q morning - 10a  . levofloxacin (LEVAQUIN) IV  750 mg Intravenous Q24H  . methylPREDNISolone (SOLU-MEDROL) injection  60 mg Intravenous Q12H  . multivitamin with minerals  1 tablet Oral Daily  . pneumococcal 23 valent vaccine  0.5 mL Intramuscular Tomorrow-1000  . sodium chloride  3 mL Intravenous Q12H  . thiamine  100 mg Oral Daily   Or  . thiamine  100 mg Intravenous Daily   Infusions:  . sodium chloride 75 mL/hr at 08/29/13 2344  . heparin 850 Units/hr (08/29/13 2345)   PRN: acetaminophen, acetaminophen,  albuterol, hydrALAZINE, ketorolac, LORazepam, LORazepam, ondansetron (ZOFRAN) IV, ondansetron, promethazine  Assessment: 62 yo M with ACS/Stemi. Cardiology requested Heparin 9/8 pm after elevated troponin level (0.61) reported. Per cards this am, elevated troponin likely due to demand ischemia. Patient has left sided rib pain and hx of right BBB noted in 2011 prior to hand surgery.  Heparin level this am remains subtherapeutic. Currently infusing at 850 units/hr  No further blood clots reported in urine  Goal of Therapy:  Heparin level 0.3-0.7 units/ml Monitor platelets by anticoagulation protocol: Yes   Plan:  1. Increase heparin to 1050 units/hr (10.5 ml/hr) 2. Recheck heparin level in 6 hours  3. Daily heparin level and CBC.   Darrol Angel, PharmD Pager: [phone] 08/30/2013,10:12 AM

## 2013-08-30 NOTE — Progress Notes (Signed)
[facility] PROGRESS NOTE  Gregory [name] WUJ:[id number] DOB: 23-Aug-1951 DOA: 08/29/2013 PCP: Carollee Herter, MD  Brief Summary  62 year old male with a history of chronic alcohol use, hypertension, bipolar disorder, seizure disorder, and asthma presents with presyncope his type symptoms with associated false in the past month. Urine drug screen was negative. The patient was placed on CIWA as he began to exhibit signs of Etoh withdraw. The patient fell and hit his right ribs. X-rays were negative for any fractures or pneumothorax. The patient was noted to have a soft blood pressures with systolics in the 90s. The patient will start intravenous fluids with some improvement. During the hospitalization, the patient was found to be increasingly hypoxemic requiring 50% Venturi mask. The patient was treated for COPD exacerbation with his long tobacco history. He was started on intravenous Solu-Medrol which is weaned to po prednisone.  Due to the patient's hypoxemia, tachycardia and elevated d-dimer, CT angiogram of the chest was done--> negative for pulmonary embolus but revealed bilateral infiltrates suggestive of CAP. The patient was started on levofloxacin. The patient was noted to have elevated troponins. Cardiology was consulted. They started the patient on heparin drip pending the trend of his troponins. Orthostatics are negative. Echocardiogram was Ordered.  Assessment/Plan: Acute hypoxemic respiratory failure  -Likely due to his COPD exacerbation, rule out pulmonary embolus  -ABG on 50% Venturi mask  -CT angiogram chest--- negative for PE, but has bilateral infiltrates CAP -Continue levofloxacin (day #2) COPD exacerbation  -Start Solu-Medrol--> weaned to by mouth prednisone -Aerosolized albuterol and Atrovent  -Continue budesonide nebulizer  Elevated troponin -likely demand ischemia -heparin drip per cardiology Gait instability/dizziness/frequent falls  -Multifactorial due to the  patient's chronic alcohol use manifestations, volume depletion/hypotension and hypoxemia  -Check serum B12, TSH, RBC folate -Orthostatics--neg, but could not stand due to weakness -Urine drug screen--neg -Echocardiogram--EF 55-60%, grade 2 diastolic dysfunction, mod AS Bipolar disorder  -Continue Lamictal and Depakote  Chronic alcohol dependence  -Alcohol withdrawal protocol  Transaminasemia  -d/c lipitor for now  -likely related to Etoh and Depakote  Hypertension  -Patient was borderline hypotensive at time of admission -Discontinued amlodipine and lisinopril hydrochlorothiazide  -restart amlodipine as BP now increase again -IV fluids  Family Communication: wife at beside  Disposition Plan: Home vs SNF-->PT eval     Procedures/Studies: Dg Chest 2 View  08/29/2013   *RADIOLOGY REPORT*  Clinical Data: Larey Seat from toilet  CHEST - 2 VIEW  Comparison: 09/05/2012  Findings: Normal heart size.  No pleural effusion or edema identified.  No airspace consolidation identified.  The visualized bony thorax is significant for thoracic spondylosis.  IMPRESSION:  1.  No acute cardiopulmonary abnormalities.   Original Report Authenticated By: Signa Kell, M.D.   Dg Ribs Bilateral  08/29/2013   *RADIOLOGY REPORT*  Clinical Data: Left lateral and posterior rib pain  BILATERAL RIBS - 3+ VIEW  Comparison: None.  Findings: No displaced rib fractures are identified.  IMPRESSION:  1.  No acute findings.   Original Report Authenticated By: Signa Kell, M.D.   Dg Pelvis 1-2 Views  08/29/2013   *RADIOLOGY REPORT*  Clinical Data: The patient fell onto the buttocks last night and now complains of low back pain.  PELVIS - 1-2 VIEW  Comparison: 06/28/2009  Findings: Degenerative changes in the lower lumbar spine and hips. Pelvis and hips appear intact.  No displaced fractures are identified.  No focal bone lesions.  SI joints, symphysis pubis, and pelvic rim are not displaced.  Vascular calcifications.  IMPRESSION:  Degenerative changes in the lower lumbar spine and hips.  No displaced fractures identified.   Original Report Authenticated By: Burman Nieves, M.D.   Ct Angio Chest Pe W/cm &/or Wo Cm  08/29/2013   *RADIOLOGY REPORT*  Clinical Data: Elevated D-dimer and hypoxemia  CT ANGIOGRAPHY CHEST  Technique:  Multidetector CT imaging of the chest using the standard protocol during bolus administration of intravenous contrast. Multiplanar reconstructed images including MIPs were obtained and reviewed to evaluate the vascular anatomy.  Contrast: OMNIPAQUE IOHEXOL 350 MG/ML SOLN  Comparison: 07/29/2011  Findings: Negative for pulmonary embolism.  There is an enlarged subcarinal lymph node that measures 1.4 cm in short axis on sequence four, image 43.  Previously, this node measured 0.5 cm. There are diffuse coronary artery calcifications.  No significant pericardial or pleural fluid.  Diffusely low attenuation of the liver is consistent with hepatic steatosis.  Suggestion for a small hiatal hernia.  Atherosclerotic calcifications in the thoracic aorta without aneurysm.  The trachea and mainstem bronchi are patent.  There is extensive airspace disease and consolidation in the posterior lower lobes bilaterally.  Findings are suggestive for pneumonia and/or aspiration.  There is centrilobular emphysema in the upper lungs. Stable small calcification in the right upper lung on sequence seven, image 17.  There are patchy densities in the right upper lung which are suggestive for an infectious or inflammatory process.  Mild patchy disease in the right middle lobe.  Left upper lung is essentially clear.  No acute bony abnormality.  IMPRESSION: Negative for pulmonary embolism.  Severe consolidation and airspace disease in the posterior lower lobes bilaterally.  In addition, there is mild patchy airspace disease in the right upper lobe and right middle lobe.  Findings are most compatible with bilateral pneumonia.  Subcarinal  lymphadenopathy.  The subcarinal lymph node has enlarged since 2012.  This could be reactive lymphadenopathy due to the airspace disease and recommend follow-up to ensure resolution or stability.  Hepatic steatosis.  Coronary artery calcifications.   Original Report Authenticated By: Richarda Overlie, M.D.         Subjective: Patient is feeling better but he continues to feel week. Reading better. No further dizziness. Denies fevers, chills, chest discomfort, nausea, vomiting, diarrhea, abdominal pain. Complains of constipation. No dysuria.  Objective: Filed Vitals:   08/30/13 0123 08/30/13 0647 08/30/13 0915 08/30/13 1209  BP:  139/77  159/76  Pulse:  64  77  Temp:  98.1 F (36.7 C)  98.3 F (36.8 C)  TempSrc:  Oral  Oral  Resp:  18  18  Height:      Weight:  61.8 kg (136 lb 3.9 oz)    SpO2: 94% 98% 93% 96%    Intake/Output Summary (Last 24 hours) at 08/30/13 1420 Last data filed at 08/30/13 0900  Gross per 24 hour  Intake  776.1 ml  Output    500 ml  Net  276.1 ml   Weight change: 1.018 kg (2 lb 3.9 oz) Exam:   General:  Pt is alert, follows commands appropriately, not in acute distress  HEENT: No icterus, No thrush,[place]/AT  Cardiovascular: RRR, S1/S2, no rubs, no gallops  Respiratory: Bilateral rales, left greater than right. No wheezes. Good air movement.  Abdomen: Soft/+BS, non tender, non distended, no guarding  Extremities: No edema, No lymphangitis, No petechiae, No rashes, no synovitis  Data Reviewed: Basic Metabolic Panel:  Recent Labs Lab 08/29/13 0100 08/29/13 0705 08/29/13 1312 08/30/13 0800  NA 136  --  133* 133*  K 4.4  --  3.7 4.1  CL 95*  --  93* 97  CO2 24  --  26 29  GLUCOSE 117*  --  227* 124*  BUN 5*  --  6 9  CREATININE 0.86 0.64 0.52 0.53  CALCIUM 8.8  --  8.8 8.2*   Liver Function Tests:  Recent Labs Lab 08/29/13 0100  AST 69*  ALT 45  ALKPHOS 119*  BILITOT 0.3  PROT 7.7  ALBUMIN 3.3*   No results found for this basename:  LIPASE, AMYLASE,  in the last 168 hours No results found for this basename: AMMONIA,  in the last 168 hours CBC:  Recent Labs Lab 08/29/13 0100 08/29/13 0705 08/30/13 0800  WBC 7.4 5.3 11.2*  NEUTROABS 5.3  --   --   HGB 15.9 13.6 13.9  HCT 45.0 39.5 40.7  MCV 97.6 99.2 98.1  PLT 151 131* 132*   Cardiac Enzymes:  Recent Labs Lab 08/29/13 0705 08/29/13 1312 08/29/13 1845  TROPONINI <0.30 0.61* 0.71*   BNP: No components found with this basename: POCBNP,  CBG: No results found for this basename: GLUCAP,  in the last 168 hours  No results found for this or any previous visit (from the past 240 hour(s)).   Scheduled Meds: . albuterol  2.5 mg Nebulization Q6H  . amLODipine  5 mg Oral Daily  . aspirin  81 mg Oral Daily  . divalproex  2,000 mg Oral QHS  . docusate sodium  100 mg Oral BID  . fluticasone  2 spray Each Nare Daily  . heparin  2,000 Units Intravenous Once  . ipratropium  0.5 mg Nebulization Q6H  . lamoTRIgine  200 mg Oral q morning - 10a  . levofloxacin  750 mg Oral Q24H  . mometasone-formoterol  2 puff Inhalation BID  . multivitamin with minerals  1 tablet Oral Daily  . [START ON 08/31/2013] predniSONE  50 mg Oral Q breakfast  . senna  2 tablet Oral Daily  . sodium chloride  3 mL Intravenous Q12H  . thiamine  100 mg Oral Daily   Or  . thiamine  100 mg Intravenous Daily   Continuous Infusions: . sodium chloride 75 mL/hr at 08/30/13 1216  . heparin 1,050 Units/hr (08/30/13 1127)     Kentarius Partington, DO  [facility] Pager [phone]  If 7PM-7AM, please contact night-coverage [email or web address] Password [email or web address] 08/30/2013, 2:20 PM   LOS: 1 day

## 2013-08-31 DIAGNOSIS — I10 Essential (primary) hypertension: Secondary | ICD-10-CM

## 2013-08-31 DIAGNOSIS — E785 Hyperlipidemia, unspecified: Secondary | ICD-10-CM

## 2013-08-31 LAB — VITAMIN B12: Vitamin B-12: 636 pg/mL (ref 211–911)

## 2013-08-31 LAB — BASIC METABOLIC PANEL
BUN: 11 mg/dL (ref 6–23)
CO2: 31 mEq/L (ref 19–32)
Calcium: 9 mg/dL (ref 8.4–10.5)
Chloride: 97 mEq/L (ref 96–112)
Creatinine, Ser: 0.59 mg/dL (ref 0.50–1.35)
GFR calc Af Amer: >90 mL/min (ref >90)
GFR calc non Af Amer: >90 mL/min (ref >90)
Glucose, Bld: 101 mg/dL — ABNORMAL HIGH (ref 70–99)
Potassium: 3.7 mEq/L (ref 3.5–5.1)
Sodium: 135 mEq/L (ref 135–145)

## 2013-08-31 LAB — CBC
HCT: 42.1 % (ref 39.0–52.0)
Hemoglobin: 14.1 g/dL (ref 13.0–17.0)
MCH: 33.3 pg (ref 26.0–34.0)
MCHC: 33.5 g/dL (ref 30.0–36.0)
MCV: 99.5 fL (ref 78.0–100.0)
Platelets: 135 10*3/uL — ABNORMAL LOW (ref 150–400)
RBC: 4.23 MIL/uL (ref 4.22–5.81)
RDW: 13.9 % (ref 11.5–15.5)
WBC: 7.9 10*3/uL (ref 4.0–10.5)

## 2013-08-31 LAB — HEPARIN LEVEL (UNFRACTIONATED): Heparin Unfractionated: 0.29 IU/mL — ABNORMAL LOW (ref 0.30–0.70)

## 2013-08-31 MED ORDER — HEPARIN (PORCINE) IN NACL 100-0.45 UNIT/ML-% IJ SOLN
1400.0000 [IU]/h | INTRAMUSCULAR | Status: DC
  Filled 2013-08-31: qty 250

## 2013-08-31 MED ORDER — PREDNISONE 20 MG PO TABS
40.0000 mg | ORAL_TABLET | Freq: Every day | ORAL | Status: DC
  Administered 2013-09-01: 40 mg via ORAL
  Filled 2013-08-31 (×2): qty 2

## 2013-08-31 MED ORDER — OXYCODONE-ACETAMINOPHEN 5-325 MG PO TABS
1.0000 | ORAL_TABLET | ORAL | Status: DC | PRN
  Administered 2013-08-31 (×4): 1 via ORAL
  Filled 2013-08-31 (×4): qty 1

## 2013-08-31 NOTE — Evaluation (Signed)
Physical Therapy Evaluation Patient Details Name: Gregory [name] MRN: [id number] DOB: Jul 03, 1951 Today's Date: 08/31/2013 Time: 8295-6213 PT Time Calculation (min): 29 min  PT Assessment / Plan / Recommendation History of Present Illness  62 year old male with a history of chronic alcohol use, hypertension, bipolar disorder, seizure disorder, and asthma presents with presyncope his type symptoms with associated false in the past month. Urine drug screen was negative. The patient was placed on CIWA as he began to exhibit signs of Etoh withdraw. The patient fell and hit his right ribs. X-rays were negative for any fractures or pneumothorax. The patient was noted to have a soft blood pressures with systolics in the 90s. The patient will start intravenous fluids with some improvement. During the hospitalization, the patient was found to be increasingly hypoxemic requiring 50% Venturi mask. The patient was treated for COPD exacerbation with his long tobacco history. He was started on intravenous Solu-Medrol which is weaned to po prednisone.  Due to the patient's hypoxemia, tachycardia and elevated d-dimer, CT angiogram of the chest was done--> negative for pulmonary embolus but revealed bilateral infiltrates suggestive of CAP  Clinical Impression  Pt would benefit from STSNF, pt at risk for falls; Pt wife travels for work, both are concerned about him being home alone and PT in agreement    PT Assessment  Patient needs continued PT services    Follow Up Recommendations  SNF    Does the patient have the potential to tolerate intense rehabilitation      Barriers to Discharge Decreased caregiver support wife travels and has had back surgery (can't lift)    Equipment Recommendations  Rolling walker with 5" wheels    Recommendations for Other Services     Frequency Min 3X/week    Precautions / Restrictions Precautions Precautions: Fall   Pertinent Vitals/Pain 2/10 pain ribs       Mobility  Bed Mobility Bed Mobility: Supine to Sit;Sit to Supine Supine to Sit: 4: Min guard Sit to Supine: 4: Min guard Details for Bed Mobility Assistance: cues for safety Transfers Transfers: Sit to Stand;Stand to Sit Sit to Stand: 4: Min assist;From bed Stand to Sit: 4: Min assist;To bed Details for Transfer Assistance: cues for hand placement and safety  Ambulation/Gait Ambulation/Gait Assistance: 4: Min assist;3: Mod assist Ambulation Distance (Feet): 100 Feet (and 80' more after rest) Assistive device: Rolling walker Ambulation/Gait Assistance Details: multi-modal cues for RW distance from self, step length, safety; assist for balance throughout Gait Pattern: Shuffle;Wide base of support;Decreased trunk rotation;Decreased stride length Gait velocity: decr General Gait Details: unsteady    Exercises     PT Diagnosis: Difficulty walking;Generalized weakness  PT Problem List: Decreased strength;Decreased activity tolerance;Decreased balance;Decreased mobility;Decreased knowledge of use of DME;Decreased coordination;Decreased safety awareness PT Treatment Interventions: DME instruction;Gait training;Therapeutic activities;Functional mobility training;Patient/family education;Balance training;Therapeutic exercise     PT Goals(Current goals can be found in the care plan section) Acute Rehab PT Goals Patient Stated Goal: incr safety and independence PT Goal Formulation: With patient/family Time For Goal Achievement: 09/09/13 Potential to Achieve Goals: Good  Visit Information  Last PT Received On: 08/31/13 Assistance Needed: +1 History of Present Illness: 62 year old male with a history of chronic alcohol use, hypertension, bipolar disorder, seizure disorder, and asthma presents with presyncope his type symptoms with associated false in the past month. Urine drug screen was negative. The patient was placed on CIWA as he began to exhibit signs of Etoh withdraw. The  patient fell and hit his right ribs. X-rays were  negative for any fractures or pneumothorax. The patient was noted to have a soft blood pressures with systolics in the 90s. The patient will start intravenous fluids with some improvement. During the hospitalization, the patient was found to be increasingly hypoxemic requiring 50% Venturi mask. The patient was treated for COPD exacerbation with his long tobacco history. He was started on intravenous Solu-Medrol which is weaned to po prednisone.  Due to the patient's hypoxemia, tachycardia and elevated d-dimer, CT angiogram of the chest was done--> negative for pulmonary embolus but revealed bilateral infiltrates suggestive of CAP       Prior Functioning  Home Living Family/patient expects to be discharged to:: Private residence Living Arrangements: Spouse/significant other Type of Home: House Home Access: Stairs to enter Secretary/administrator of Steps: 4 Entrance Stairs-Rails: Right Home Layout: One level Home Equipment: Cane - single point Prior Function Level of Independence: Independent Communication Communication: No difficulties    Cognition  Cognition Arousal/Alertness: Awake/alert Behavior During Therapy: WFL for tasks assessed/performed Overall Cognitive Status: Within Functional Limits for tasks assessed    Extremity/Trunk Assessment Upper Extremity Assessment Upper Extremity Assessment: Generalized weakness Lower Extremity Assessment Lower Extremity Assessment: Generalized weakness   Balance Static Standing Balance Static Standing - Balance Support: During functional activity Static Standing - Level of Assistance: 4: Min assist  End of Session PT - End of Session Equipment Utilized During Treatment: Gait belt Activity Tolerance: Patient limited by fatigue Patient left: in bed Nurse Communication: Mobility status  GP     [facility] 08/31/2013, 11:36 AM

## 2013-08-31 NOTE — Progress Notes (Signed)
[facility] PROGRESS NOTE  Gregory [name] WUJ:[id number] DOB: [birth year] DOA: 08/29/2013 PCP: Carollee Herter, MD  Brief Summary  63 year old male with a history of chronic alcohol use, hypertension, bipolar disorder, seizure disorder, and asthma presents with presyncope his type symptoms with associated false in the past month. Urine drug screen was negative. The patient was placed on CIWA as he began to exhibit signs of Etoh withdraw. The patient fell and hit his right ribs. X-rays were negative for any fractures or pneumothorax. The patient was noted to have a soft blood pressures with systolics in the 90s. The patient will start intravenous fluids with some improvement. During the hospitalization, the patient was found to be increasingly hypoxemic requiring 50% Venturi mask. The patient was treated for COPD exacerbation with his long tobacco history. He was started on intravenous Solu-Medrol which is weaned to po prednisone.  Due to the patient's hypoxemia, tachycardia and elevated d-dimer, CT angiogram of the chest was done--> negative for pulmonary embolus but revealed bilateral infiltrates suggestive of CAP. The patient was started on levofloxacin. The patient was noted to have elevated troponins. Cardiology was consulted. They started the patient on heparin drip pending the trend of his troponins. Orthostatics are negative. Echocardiogram was Ordered.  Assessment/Plan: Acute hypoxemic respiratory failure  -Likely due to his COPD exacerbation, rule out pulmonary embolus  -ABG on 50% Venturi mask  -CT angiogram chest--- negative for PE, but has bilateral infiltrates  CAP -Continue levofloxacin (day #3)  COPD exacerbation  -Start Solu-Medrol--> weaned to by mouth prednisone -Aerosolized albuterol and Atrovent  -Continue budesonide nebulizer   Elevated troponin -likely demand ischemia -heparin drip discontinued per cardiology Patient to follow as outpatient for cardiac stress  test  Gait instability/dizziness/frequent falls  -Multifactorial due to the patient's chronic alcohol use manifestations, volume depletion/hypotension and hypoxemia  -Check serum B12-- 636,, TSH--.702,  -Orthostatics--neg, but could not stand due to weakness -Urine drug screen--neg -Echocardiogram--EF 55-60%, grade 2 diastolic dysfunction, mod AS  Bipolar disorder  -Continue Lamictal and Depakote   Chronic alcohol dependence  -Alcohol withdrawal protocol   Transaminasemia  -d/c lipitor for now  -likely related to Etoh and Depakote  - Will repeat CMP in am.  Hypertension  -Patient was borderline hypotensive at time of admission -Discontinued amlodipine and lisinopril hydrochlorothiazide  -restart amlodipine as BP now increased  Family Comm unication: wife at beside  Disposition Plan:  SNF-     Procedures/Studies: Dg Chest 2 View  08/29/2013   *RADIOLOGY REPORT*  Clinical Data: Larey Seat from toilet  CHEST - 2 VIEW  Comparison: 09/05/2012  Findings: Normal heart size.  No pleural effusion or edema identified.  No airspace consolidation identified.  The visualized bony thorax is significant for thoracic spondylosis.  IMPRESSION:  1.  No acute cardiopulmonary abnormalities.   Original Report Authenticated By: Signa Kell, M.D.   Dg Ribs Bilateral  08/29/2013   *RADIOLOGY REPORT*  Clinical Data: Left lateral and posterior rib pain  BILATERAL RIBS - 3+ VIEW  Comparison: None.  Findings: No displaced rib fractures are identified.  IMPRESSION:  1.  No acute findings.   Original Report Authenticated By: Signa Kell, M.D.   Dg Pelvis 1-2 Views  08/29/2013   *RADIOLOGY REPORT*  Clinical Data: The patient fell onto the buttocks last night and now complains of low back pain.  PELVIS - 1-2 VIEW  Comparison: 06/28/2009  Findings: Degenerative changes in the lower lumbar spine and hips. Pelvis and hips appear intact.  No displaced fractures are identified.  No focal bone lesions.  SI joints,  symphysis pubis, and pelvic rim are not displaced.  Vascular calcifications.  IMPRESSION: Degenerative changes in the lower lumbar spine and hips.  No displaced fractures identified.   Original Report Authenticated By: Burman Nieves, M.D.   Ct Angio Chest Pe W/cm &/or Wo Cm  08/29/2013   *RADIOLOGY REPORT*  Clinical Data: Elevated D-dimer and hypoxemia  CT ANGIOGRAPHY CHEST  Technique:  Multidetector CT imaging of the chest using the standard protocol during bolus administration of intravenous contrast. Multiplanar reconstructed images including MIPs were obtained and reviewed to evaluate the vascular anatomy.  Contrast: OMNIPAQUE IOHEXOL 350 MG/ML SOLN  Comparison: 07/29/2011  Findings: Negative for pulmonary embolism.  There is an enlarged subcarinal lymph node that measures 1.4 cm in short axis on sequence four, image 43.  Previously, this node measured 0.5 cm. There are diffuse coronary artery calcifications.  No significant pericardial or pleural fluid.  Diffusely low attenuation of the liver is consistent with hepatic steatosis.  Suggestion for a small hiatal hernia.  Atherosclerotic calcifications in the thoracic aorta without aneurysm.  The trachea and mainstem bronchi are patent.  There is extensive airspace disease and consolidation in the posterior lower lobes bilaterally.  Findings are suggestive for pneumonia and/or aspiration.  There is centrilobular emphysema in the upper lungs. Stable small calcification in the right upper lung on sequence seven, image 17.  There are patchy densities in the right upper lung which are suggestive for an infectious or inflammatory process.  Mild patchy disease in the right middle lobe.  Left upper lung is essentially clear.  No acute bony abnormality.  IMPRESSION: Negative for pulmonary embolism.  Severe consolidation and airspace disease in the posterior lower lobes bilaterally.  In addition, there is mild patchy airspace disease in the right upper lobe and  right middle lobe.  Findings are most compatible with bilateral pneumonia.  Subcarinal lymphadenopathy.  The subcarinal lymph node has enlarged since 2012.  This could be reactive lymphadenopathy due to the airspace disease and recommend follow-up to ensure resolution or stability.  Hepatic steatosis.  Coronary artery calcifications.   Original Report Authenticated By: Richarda Overlie, M.D.         Subjective: Patient is feeling better, though still feels weak.  Objective: Filed Vitals:   08/30/13 2024 08/30/13 2110 08/31/13 0601 08/31/13 0926  BP: 142/72 144/76 159/91   Pulse: 101 99 90   Temp:  98.2 F (36.8 C) 97 F (36.1 C)   TempSrc:  Oral Oral   Resp:  18 20   Height:      Weight:      SpO2:  98% 98% 99%    Intake/Output Summary (Last 24 hours) at 08/31/13 1350 Last data filed at 08/31/13 1100  Gross per 24 hour  Intake 2578.75 ml  Output   1850 ml  Net 728.75 ml   Weight change:  Exam:   General:  Pt is in no acute distress  HEENT: No icterus, No thrush,[employer]/AT  Cardiovascular: S1/S2, regular  Respiratory: Clear bilaterally  Abdomen: Soft, nontender, nondistended  Extremities: No edema  Data Reviewed: Basic Metabolic Panel:  Recent Labs Lab 08/29/13 0100 08/29/13 0705 08/29/13 1312 08/30/13 0800 08/31/13 0808  NA 136  --  133* 133* 135  K 4.4  --  3.7 4.1 3.7  CL 95*  --  93* 97 97  CO2 24  --  26 29 31   GLUCOSE 117*  --  227* 124* 101*  BUN  5*  --  6 9 11   CREATININE 0.86 0.64 0.52 0.53 0.59  CALCIUM 8.8  --  8.8 8.2* 9.0   Liver Function Tests:  Recent Labs Lab 08/29/13 0100  AST 69*  ALT 45  ALKPHOS 119*  BILITOT 0.3  PROT 7.7  ALBUMIN 3.3*   No results found for this basename: LIPASE, AMYLASE,  in the last 168 hours No results found for this basename: AMMONIA,  in the last 168 hours CBC:  Recent Labs Lab 08/29/13 0100 08/29/13 0705 08/30/13 0800 08/31/13 0808  WBC 7.4 5.3 11.2* 7.9  NEUTROABS 5.3  --   --   --   HGB 15.9  13.6 13.9 14.1  HCT 45.0 39.5 40.7 42.1  MCV 97.6 99.2 98.1 99.5  PLT 151 131* 132* 135*   Cardiac Enzymes:  Recent Labs Lab 08/29/13 0705 08/29/13 1312 08/29/13 1845  TROPONINI <0.30 0.61* 0.71*   BNP: No components found with this basename: POCBNP,  CBG: No results found for this basename: GLUCAP,  in the last 168 hours  No results found for this or any previous visit (from the past 240 hour(s)).   Scheduled Meds: . amLODipine  5 mg Oral Daily  . aspirin  81 mg Oral Daily  . divalproex  2,000 mg Oral QHS  . docusate sodium  100 mg Oral BID  . fluticasone  2 spray Each Nare Daily  . influenza vac split quadrivalent PF  0.5 mL Intramuscular Tomorrow-1000  . lamoTRIgine  200 mg Oral q morning - 10a  . levofloxacin  750 mg Oral Q24H  . mometasone-formoterol  2 puff Inhalation BID  . multivitamin with minerals  1 tablet Oral Daily  . predniSONE  50 mg Oral Q breakfast  . senna  2 tablet Oral Daily  . sodium chloride  3 mL Intravenous Q12H  . thiamine  100 mg Oral Daily   Or  . thiamine  100 mg Intravenous Daily   Continuous Infusions: . sodium chloride 75 mL/hr at 08/31/13 0053     [facility] S, MD [facility] Pager [phone]  If 7PM-7AM, please contact night-coverage [email or web address] Password [email or web address] 08/31/2013, 1:50 PM   LOS: 2 days

## 2013-08-31 NOTE — Progress Notes (Signed)
SUBJECTIVE: No chest pain. Breathing better. C/o blood in urine and pain in left side at site of trauma.   BP 159/91  Pulse 90  Temp(Src) 97 F (36.1 C) (Oral)  Resp 20  Ht 5\' 5"  (1.651 m)  Wt 136 lb 3.9 oz (61.8 kg)  BMI 22.67 kg/m2  SpO2 98%  Intake/Output Summary (Last 24 hours) at 08/31/13 0648 Last data filed at 08/31/13 0600  Gross per 24 hour  Intake 3430.63 ml  Output   1950 ml  Net 1480.63 ml    PHYSICAL EXAM General: Well developed, well nourished, in no acute distress. Alert and oriented x 3.  Psych:  Good affect, responds appropriately Neck: No JVD. No masses noted.  Lungs: Clear bilaterally with no wheezes or rhonci noted.  Heart: RRR with no murmurs noted. Abdomen: Bowel sounds are present. Soft, non-tender.  Extremities: No lower extremity edema.   LABS: Basic Metabolic Panel:  Recent Labs  16/10/96 1312 08/30/13 0800  NA 133* 133*  K 3.7 4.1  CL 93* 97  CO2 26 29  GLUCOSE 227* 124*  BUN 6 9  CREATININE 0.52 0.53  CALCIUM 8.8 8.2*   CBC:  Recent Labs  08/29/13 0100 08/29/13 0705 08/30/13 0800  WBC 7.4 5.3 11.2*  NEUTROABS 5.3  --   --   HGB 15.9 13.6 13.9  HCT 45.0 39.5 40.7  MCV 97.6 99.2 98.1  PLT 151 131* 132*   Cardiac Enzymes:  Recent Labs  08/29/13 0705 08/29/13 1312 08/29/13 1845  TROPONINI <0.30 0.61* 0.71*   Current Meds: . albuterol  2.5 mg Nebulization TID  . amLODipine  5 mg Oral Daily  . aspirin  81 mg Oral Daily  . divalproex  2,000 mg Oral QHS  . docusate sodium  100 mg Oral BID  . fluticasone  2 spray Each Nare Daily  . heparin  2,000 Units Intravenous Once  . influenza vac split quadrivalent PF  0.5 mL Intramuscular Tomorrow-1000  . ipratropium  0.5 mg Nebulization TID  . lamoTRIgine  200 mg Oral q morning - 10a  . levofloxacin  750 mg Oral Q24H  . mometasone-formoterol  2 puff Inhalation BID  . multivitamin with minerals  1 tablet Oral Daily  . predniSONE  50 mg Oral Q breakfast  . senna  2  tablet Oral Daily  . sodium chloride  3 mL Intravenous Q12H  . thiamine  100 mg Oral Daily   Or  . thiamine  100 mg Intravenous Daily   Echo 08/30/13:  Left ventricle: The cavity size was normal. Systolic function was normal. The estimated ejection fraction was in the range of 55% to 60%. Wall motion was normal; there were no regional wall motion abnormalities. Features are consistent with a pseudonormal left ventricular filling pattern, with concomitant abnormal relaxation and increased filling pressure (grade 2 diastolic dysfunction). - Aortic valve: Moderate thickening and calcification. There was moderate stenosis. Valve area: 1.13cm^2(VTI). Valve area: 1.12cm^2 (Vmax).  ASSESSMENT AND PLAN:   1. Pneumonia: Per primary team.   2. Slight elevation troponin: Likely due to demand ischemia. Cardiac markers trend is flat. Echo with no wall motion abnormalities. Would recommend outpatient stress test when he recovers from his acute illness. OK to stop heparin drip.   3. Moderate aortic valve stenosis: Will follow. Should have no hemodynamic significance.   He could be discharged from a cardiac standpoint with close follow up in 2-3 weeks with Dr. Peter Swaziland (his primary cardiologist) in our  [facility] office. Stress testing can be discussed at that time.    ASSESSMENT AND PLAN:     Gregory [name]  9/10/20146:48 AM

## 2013-08-31 NOTE — Progress Notes (Signed)
ANTICOAGULATION CONSULT NOTE - Follow Up Consult  Pharmacy Consult for Heparin Indication: chest pain/ACS  Allergies  Allergen Reactions  . Codeine Anaphylaxis  . Amoxicillin     intolerance  . Augmentin [Amoxicillin-Pot Clavulanate]     nausea    Patient Measurements: Height: 5\' 5"  (165.1 cm) Weight: 136 lb 3.9 oz (61.8 kg) IBW/kg (Calculated) : 61.5 Heparin Dosing Weight:   Vital Signs: Temp: 98.2 F (36.8 C) (09/09 2110) Temp src: Oral (09/09 2110) BP: 144/76 mmHg (09/09 2110) Pulse Rate: 99 (09/09 2110)  Labs:  Recent Labs  08/29/13 0100 08/29/13 0705 08/29/13 1312 08/29/13 1845  08/30/13 0800 08/30/13 1646 08/31/13 0020  HGB 15.9 13.6  --   --   --  13.9  --   --   HCT 45.0 39.5  --   --   --  40.7  --   --   PLT 151 131*  --   --   --  132*  --   --   APTT  --   --  48*  --   --   --   --   --   LABPROT  --   --  13.0  --   --   --   --   --   INR  --   --  1.00  --   --   --   --   --   HEPARINUNFRC  --   --   --   --   < > 0.16* 0.20* 0.29*  CREATININE 0.86 0.64 0.52  --   --  0.53  --   --   TROPONINI  --  <0.30 0.61* 0.71*  --   --   --   --   < > = values in this interval not displayed.  Estimated Creatinine Clearance: 82.2 ml/min (by C-G formula based on Cr of 0.53).   Medications:  Infusions:  . sodium chloride 75 mL/hr at 08/31/13 0053  . heparin 1,400 Units/hr (08/31/13 0128)    Assessment: Patient with low heparin level.  No issues per RN.  Goal of Therapy:  Heparin level 0.3-0.7 units/ml Monitor platelets by anticoagulation protocol: Yes   Plan:  Increase to 1400 units/hr heparin iv and recheck level at 0800  Darlina Guys, Jacquenette Shone Crowford 08/31/2013,5:00 AM

## 2013-09-01 DIAGNOSIS — F319 Bipolar disorder, unspecified: Secondary | ICD-10-CM

## 2013-09-01 LAB — CBC
HCT: 39.7 % (ref 39.0–52.0)
Hemoglobin: 13.5 g/dL (ref 13.0–17.0)
MCH: 33.6 pg (ref 26.0–34.0)
MCHC: 34 g/dL (ref 30.0–36.0)
MCV: 98.8 fL (ref 78.0–100.0)
Platelets: 126 10*3/uL — ABNORMAL LOW (ref 150–400)
RBC: 4.02 MIL/uL — ABNORMAL LOW (ref 4.22–5.81)
RDW: 13.8 % (ref 11.5–15.5)
WBC: 7.1 10*3/uL (ref 4.0–10.5)

## 2013-09-01 LAB — FOLATE RBC: RBC Folate: 653 ng/mL — ABNORMAL HIGH (ref >=366)

## 2013-09-01 MED ORDER — PREDNISONE 10 MG PO TABS: ORAL_TABLET | ORAL | Status: DC

## 2013-09-01 MED ORDER — LEVOFLOXACIN 750 MG PO TABS: 750.0000 mg | ORAL_TABLET | ORAL | Status: DC

## 2013-09-01 MED ORDER — ALBUTEROL SULFATE (5 MG/ML) 0.5% IN NEBU: 2.5000 mg | INHALATION_SOLUTION | RESPIRATORY_TRACT | Status: DC | PRN

## 2013-09-01 MED ORDER — AMLODIPINE BESYLATE 10 MG PO TABS
10.0000 mg | ORAL_TABLET | Freq: Every day | ORAL | Status: DC
  Administered 2013-09-01: 10 mg via ORAL
  Filled 2013-09-01: qty 1

## 2013-09-01 MED ORDER — ASPIRIN EC 81 MG PO TBEC: 81.0000 mg | DELAYED_RELEASE_TABLET | Freq: Every day | ORAL | Status: DC

## 2013-09-01 MED ORDER — AMLODIPINE BESYLATE 10 MG PO TABS: 10.0000 mg | ORAL_TABLET | Freq: Every day | ORAL | Status: DC

## 2013-09-01 MED ORDER — METOPROLOL TARTRATE 25 MG PO TABS: 25.0000 mg | ORAL_TABLET | Freq: Two times a day (BID) | ORAL | Status: DC

## 2013-09-01 MED ORDER — METOPROLOL TARTRATE 25 MG PO TABS
25.0000 mg | ORAL_TABLET | Freq: Two times a day (BID) | ORAL | Status: DC
  Administered 2013-09-01: 25 mg via ORAL
  Filled 2013-09-01 (×2): qty 1

## 2013-09-01 NOTE — Progress Notes (Signed)
RN called report to Durward Parcel, RN at [facility]. Transportation has been called and patient is eagerly waiting d/c.  J.Saher Davee, RN

## 2013-09-01 NOTE — Progress Notes (Signed)
Clinical Social Work Department BRIEF PSYCHOSOCIAL ASSESSMENT 09/01/2013  Patient:  Gregory [name], Gregory [name]     Account Number:  [id number]     Admit date:  08/29/2013  Clinical Social Worker:  Orpah Greek  Date/Time:  09/01/2013 11:00 AM  Referred by:  Physician  Date Referred:  09/01/2013 Referred for  SNF Placement   Other Referral:   Interview type:  Patient Other interview type:   and wife, Gregory [name] at bedside    PSYCHOSOCIAL DATA Living Status:  WIFE Admitted from facility:   Level of care:   Primary support name:  Gregory [name] (wife) [phone] Primary support relationship to patient:  SPOUSE Degree of support available:   good    CURRENT CONCERNS Current Concerns  Post-Acute Placement   Other Concerns:    SOCIAL WORK ASSESSMENT / PLAN CSW reviewed PT evaluation recommending SNF, though patient ambulated 100' min assist - min guard.   Assessment/plan status:  Information/Referral to [facility] Other assessment/ plan:   Information/referral to community resources:   CSW completed FL2 and faxed information out to [facility] - will provide bed offers when available.    PATIENT'S/FAMILY'S RESPONSE TO PLAN OF CARE: Patient & wife are agreeable with plan for SNF (though CSW informed patient & wife that we are awaiting call back from [facility] re: prior authorization). Patient's wife is requesting [name] or Gregory [name] - CSW awaiting call back from both facilities & [facility] Medicare case Production designer, theatre/television/film. Anticipating discharge today.       Gregory Bailey, LCSW [facility] Clinical Social Worker cell #: [phone]

## 2013-09-01 NOTE — Progress Notes (Signed)
CSW provided bed offers to patient & wife. Wife toured facilities this afternoon and has decided to accept bed offer @ [facility]. Discharge packet given to RN, Jenn. [facility] scheduled for transport pickup (Service Request Id: [id number]).   Clinical Social Work Department CLINICAL SOCIAL WORK PLACEMENT NOTE 09/01/2013  Patient:  Gregory [name], Gregory [name]  Account Number:  [id number] Admit date:  08/29/2013  Clinical Social Worker:  Orpah Greek  Date/time:  09/01/2013 11:04 AM  Clinical Social Work is seeking post-discharge placement for this patient at the following level of care:   SKILLED NURSING   (*CSW will update this form in Epic as items are completed)   09/01/2013  Patient/family provided with [facility] Department of Clinical Social Work's list of facilities offering this level of care within the geographic area requested by the patient (or if unable, by the patient's family).  09/01/2013  Patient/family informed of their freedom to choose among providers that offer the needed level of care, that participate in Medicare, Medicaid or managed care program needed by the patient, have an available bed and are willing to accept the patient.  09/01/2013  Patient/family informed of [facility]' ownership interest in [facility], as well as of the fact that they are under no obligation to receive care at this facility.  PASARR submitted to EDS on 09/01/2013 PASARR number received from EDS on 09/01/2013  FL2 transmitted to all facilities in geographic area requested by pt/family on  09/01/2013 FL2 transmitted to all facilities within larger geographic area on   Patient informed that his/her managed care company has contracts with or will negotiate with  certain facilities, including the following:     Patient/family informed of bed offers received:  09/01/2013 Patient chooses bed at [facility] Physician recommends and patient  chooses bed at    Patient to be transferred to [facility] on  09/01/2013 Patient to be transferred to facility by [facility]  The following physician request were entered in Epic:   Additional Comments:   Unice Bailey, LCSW [facility] Clinical Social Worker cell #: [phone]

## 2013-09-01 NOTE — Discharge Summary (Signed)
Physician Discharge Summary  Gregory [name] JYN:[id number] DOB: [birth year] DOA: 08/29/2013  PCP: Carollee Herter, MD  Admit date: 08/29/2013 Discharge date: 09/01/2013  Time spent: *50 minutes  Recommendations for Outpatient Follow-up:  1. Follow up Cardiology in 2-3 weeksfor outpatient stress test  Discharge Diagnoses:  Principal Problem:   Pre-syncope Active Problems:   Asthma   Active smoker   Alcoholic   Hypertension   COPD exacerbation   Fall   Contusion of ribs   Acute respiratory failure   Alcohol dependence   CAP (community acquired pneumonia)   Discharge Condition: Stable  Diet recommendation: Low salt diet  Filed Weights   08/29/13 0022 08/30/13 0647  Weight: 60.782 kg (134 lb) 61.8 kg (136 lb 3.9 oz)    History of present illness:  62 year old male with a history of chronic alcohol use, hypertension, bipolar disorder, seizure disorder, and asthma presents with presyncope his type symptoms with associated false in the past month. Urine drug screen was negative. The patient was placed on CIWA as he began to exhibit signs of Etoh withdraw. The patient fell and hit his right ribs. X-rays were negative for any fractures or pneumothorax. The patient was noted to have a soft blood pressures with systolics in the 90s. The patient will start intravenous fluids with some improvement. During the hospitalization, the patient was found to be increasingly hypoxemic requiring 50% Venturi mask. The patient was treated for COPD exacerbation with his long tobacco history. He was started on intravenous Solu-Medrol which is weaned to po prednisone. Due to the patient's hypoxemia, tachycardia and elevated d-dimer, CT angiogram of the chest was done--> negative for pulmonary embolus but revealed bilateral infiltrates suggestive of CAP. The patient was started on levofloxacin. The patient was noted to have elevated troponins. Cardiology was consulted. They started the patient on heparin  drip pending the trend of his troponins. Orthostatics are negative   Hospital Course:  Acute hypoxemic respiratory failure  improved -Likely due to his COPD exacerbation, rule out pulmonary embolus  -ABG on 50% Venturi mask  -CT angiogram chest--- negative for PE, but has bilateral infiltrates   CAP  -Continue levofloxacin for five more days  COPD exacerbation  Improved -Start Solu-Medrol--> weaned to by mouth prednisone  taper -Aerosolized albuterol and Atrovent were given    Elevated troponin  -likely demand ischemia  -heparin drip discontinued per cardiology  Patient to follow as outpatient for cardiac stress test   Gait instability/dizziness/frequent falls  -Multifactorial due to the patient's chronic alcohol use manifestations, volume depletion/hypotension and hypoxemia  -Check serum B12-- 636,, TSH--.702,  -Orthostatics--neg, but could not stand due to weakness  -Urine drug screen--neg  -Echocardiogram--EF 55-60%, grade 2 diastolic dysfunction, mod AS   Bipolar disorder  -Continue Lamictal and Depakote   Chronic alcohol dependence  -Alcohol withdrawal protocol was started, he is not in withdrawal at this time.  Transaminasemia  - Only mild elevation of LFT's Will continue with Lipitor Will need repeat CMP in four weeks  Hypertension  -Patient was borderline hypotensive at time of admission  -Discontinued amlodipine and lisinopril hydrochlorothiazide  -restart amlodipine  At 10 mg po daily along with metoprolol 25 mg po BID     Procedures:  Echocardiogram  Consultations:  Cardiology  Discharge Exam: Filed Vitals:   09/01/13 1401  BP: 162/78  Pulse: 66  Temp: 98.2 F (36.8 C)  Resp: 20    General: Appear in no acute distress Cardiovascular: S1s2 RRR Respiratory: Mild wheezing bilaterally Ext : No edema  Discharge Instructions  Discharge Orders   Future Orders Complete By Expires   Diet - low sodium heart healthy  As directed     Increase activity slowly  As directed        Medication List         albuterol (5 MG/ML) 0.5% nebulizer solution  Commonly known as:  PROVENTIL  Take 0.5 mLs (2.5 mg total) by nebulization every 2 (two) hours as needed for wheezing.     amLODipine 10 MG tablet  Commonly known as:  NORVASC  Take 1 tablet (10 mg total) by mouth daily.     aspirin EC 81 MG tablet  Take 1 tablet (81 mg total) by mouth daily.     atorvastatin 20 MG tablet  Commonly known as:  LIPITOR  Take 1 tablet (20 mg total) by mouth daily.     divalproex 500 MG DR tablet  Commonly known as:  DEPAKOTE  Take 4 tablets (2,000 mg total) by mouth at bedtime.     fluticasone 50 MCG/ACT nasal spray  Commonly known as:  FLONASE  Place 2 sprays into the nose daily.     Fluticasone-Salmeterol 500-50 MCG/DOSE Aepb  Commonly known as:  ADVAIR DISKUS  Inhale 1 puff into the lungs every 12 (twelve) hours.     lamoTRIgine 200 MG tablet  Commonly known as:  LAMICTAL  Take 1 tablet (200 mg total) by mouth every morning.     levofloxacin 750 MG tablet  Commonly known as:  LEVAQUIN  Take 1 tablet (750 mg total) by mouth daily.     l         metoprolol tartrate 25 MG tablet  Commonly known as:  LOPRESSOR  Take 1 tablet (25 mg total) by mouth 2 (two) times daily.     multivitamin with minerals Tabs tablet  Take 1 tablet by mouth daily.     predniSONE 10 MG tablet  Commonly known as:  DELTASONE  - Prednisone 40 mg po daily x 1 day then  - Prednisone 30 mg po daily x 1 day then  - Prednisone 20 mg po daily x 1 day then  - Prednisone 10 mg daily x 1 day then stop...       Allergies  Allergen Reactions  . Codeine Anaphylaxis  . Amoxicillin     intolerance  . Augmentin [Amoxicillin-Pot Clavulanate]     nausea      The results of significant diagnostics from this hospitalization (including imaging, microbiology, ancillary and laboratory) are listed below for reference.    Significant Diagnostic  Studies: Dg Chest 2 View  08/29/2013   *RADIOLOGY REPORT*  Clinical Data: Larey Seat from toilet  CHEST - 2 VIEW  Comparison: 09/05/2012  Findings: Normal heart size.  No pleural effusion or edema identified.  No airspace consolidation identified.  The visualized bony thorax is significant for thoracic spondylosis.  IMPRESSION:  1.  No acute cardiopulmonary abnormalities.   Original Report Authenticated By: Signa Kell, M.D.   Dg Ribs Bilateral  08/29/2013   *RADIOLOGY REPORT*  Clinical Data: Left lateral and posterior rib pain  BILATERAL RIBS - 3+ VIEW  Comparison: None.  Findings: No displaced rib fractures are identified.  IMPRESSION:  1.  No acute findings.   Original Report Authenticated By: Signa Kell, M.D.   Dg Pelvis 1-2 Views  08/29/2013   *RADIOLOGY REPORT*  Clinical Data: The patient fell onto the buttocks last night and now complains of low back pain.  PELVIS -  1-2 VIEW  Comparison: 06/28/2009  Findings: Degenerative changes in the lower lumbar spine and hips. Pelvis and hips appear intact.  No displaced fractures are identified.  No focal bone lesions.  SI joints, symphysis pubis, and pelvic rim are not displaced.  Vascular calcifications.  IMPRESSION: Degenerative changes in the lower lumbar spine and hips.  No displaced fractures identified.   Original Report Authenticated By: Burman Nieves, M.D.   Ct Angio Chest Pe W/cm &/or Wo Cm  08/29/2013   *RADIOLOGY REPORT*  Clinical Data: Elevated D-dimer and hypoxemia  CT ANGIOGRAPHY CHEST  Technique:  Multidetector CT imaging of the chest using the standard protocol during bolus administration of intravenous contrast. Multiplanar reconstructed images including MIPs were obtained and reviewed to evaluate the vascular anatomy.  Contrast: OMNIPAQUE IOHEXOL 350 MG/ML SOLN  Comparison: 07/29/2011  Findings: Negative for pulmonary embolism.  There is an enlarged subcarinal lymph node that measures 1.4 cm in short axis on sequence four, image 43.   Previously, this node measured 0.5 cm. There are diffuse coronary artery calcifications.  No significant pericardial or pleural fluid.  Diffusely low attenuation of the liver is consistent with hepatic steatosis.  Suggestion for a small hiatal hernia.  Atherosclerotic calcifications in the thoracic aorta without aneurysm.  The trachea and mainstem bronchi are patent.  There is extensive airspace disease and consolidation in the posterior lower lobes bilaterally.  Findings are suggestive for pneumonia and/or aspiration.  There is centrilobular emphysema in the upper lungs. Stable small calcification in the right upper lung on sequence seven, image 17.  There are patchy densities in the right upper lung which are suggestive for an infectious or inflammatory process.  Mild patchy disease in the right middle lobe.  Left upper lung is essentially clear.  No acute bony abnormality.  IMPRESSION: Negative for pulmonary embolism.  Severe consolidation and airspace disease in the posterior lower lobes bilaterally.  In addition, there is mild patchy airspace disease in the right upper lobe and right middle lobe.  Findings are most compatible with bilateral pneumonia.  Subcarinal lymphadenopathy.  The subcarinal lymph node has enlarged since 2012.  This could be reactive lymphadenopathy due to the airspace disease and recommend follow-up to ensure resolution or stability.  Hepatic steatosis.  Coronary artery calcifications.   Original Report Authenticated By: Richarda Overlie, M.D.    Microbiology: No results found for this or any previous visit (from the past 240 hour(s)).   Labs: Basic Metabolic Panel:  Recent Labs Lab 08/29/13 0100 08/29/13 0705 08/29/13 1312 08/30/13 0800 08/31/13 0808  NA 136  --  133* 133* 135  K 4.4  --  3.7 4.1 3.7  CL 95*  --  93* 97 97  CO2 24  --  26 29 31   GLUCOSE 117*  --  227* 124* 101*  BUN 5*  --  6 9 11   CREATININE 0.86 0.64 0.52 0.53 0.59  CALCIUM 8.8  --  8.8 8.2* 9.0    Liver Function Tests:  Recent Labs Lab 08/29/13 0100  AST 69*  ALT 45  ALKPHOS 119*  BILITOT 0.3  PROT 7.7  ALBUMIN 3.3*   No results found for this basename: LIPASE, AMYLASE,  in the last 168 hours No results found for this basename: AMMONIA,  in the last 168 hours CBC:  Recent Labs Lab 08/29/13 0100 08/29/13 0705 08/30/13 0800 08/31/13 0808 09/01/13 0410  WBC 7.4 5.3 11.2* 7.9 7.1  NEUTROABS 5.3  --   --   --   --  HGB 15.9 13.6 13.9 14.1 13.5  HCT 45.0 39.5 40.7 42.1 39.7  MCV 97.6 99.2 98.1 99.5 98.8  PLT 151 131* 132* 135* 126*   Cardiac Enzymes:  Recent Labs Lab 08/29/13 0705 08/29/13 1312 08/29/13 1845  TROPONINI <0.30 0.61* 0.71*   BNP: BNP (last 3 results)  Recent Labs  08/29/13 0100  PROBNP 182.2*   CBG: No results found for this basename: GLUCAP,  in the last 168 hours     Signed:  LAMA,GAGAN S  [facility] 09/01/2013, 3:07 PM

## 2013-09-01 NOTE — Progress Notes (Signed)
Pt's BP elevated earlier tonight 173/83.  Rechecked pt's BP around 0100 am and pt's BP still remains high, 182/81.  Pt told this RN that he did not want me to call the MD on call to get med for his BP. Pt states that it is because he is only taking Amlodipine during the day.   Will continue to monitor pt and recheck BP with vital sign check in a few hours.

## 2013-09-01 NOTE — Progress Notes (Signed)
Pt refusing to have bed alarm turned on.  Pt states he does not want to wake up his wife with the alarm bc he is getting up to [facility] frequently.  This RN educated pt on reasons why we put the bed alarm on, (i.e. Pt came in due to a fall at home, pt connected to IV pole, pt unsteady on his feet when up OOB).  Pt continues to refuse bed alarm.  Pt's wife at bedside for the night.  This RN instructed pt to call us if he needed any help throughout the night. Will continue to monitor.

## 2013-09-01 NOTE — Progress Notes (Signed)
Clinical Social Work Department CLINICAL SOCIAL WORK PLACEMENT NOTE 09/01/2013  Patient:  Gregory [name], Gregory [name]  Account Number:  [id number] Admit date:  08/29/2013  Clinical Social Worker:  Orpah Greek  Date/time:  09/01/2013 11:04 AM  Clinical Social Work is seeking post-discharge placement for this patient at the following level of care:   SKILLED NURSING   (*CSW will update this form in Epic as items are completed)   09/01/2013  Patient/family provided with [facility] Department of Clinical Social Work's list of facilities offering this level of care within the geographic area requested by the patient (or if unable, by the patient's family).  09/01/2013  Patient/family informed of their freedom to choose among providers that offer the needed level of care, that participate in Medicare, Medicaid or managed care program needed by the patient, have an available bed and are willing to accept the patient.  09/01/2013  Patient/family informed of [facility]' ownership interest in [facility], as well as of the fact that they are under no obligation to receive care at this facility.  PASARR submitted to EDS on 09/01/2013 PASARR number received from EDS on   FL2 transmitted to all facilities in geographic area requested by pt/family on  09/01/2013 FL2 transmitted to all facilities within larger geographic area on   Patient informed that his/her managed care company has contracts with or will negotiate with  certain facilities, including the following:     Patient/family informed of bed offers received:   Patient chooses bed at  Physician recommends and patient chooses bed at    Patient to be transferred to  on   Patient to be transferred to facility by   The following physician request were entered in Epic:   Additional Comments:   Unice Bailey, LCSW [facility] Clinical Social Worker cell #: [phone]

## 2013-09-15 ENCOUNTER — Telehealth: Payer: Self-pay | Admitting: Cardiovascular Disease

## 2013-09-15 DIAGNOSIS — R55 Syncope and collapse: Secondary | ICD-10-CM

## 2013-09-15 DIAGNOSIS — I10 Essential (primary) hypertension: Secondary | ICD-10-CM

## 2013-09-15 NOTE — Telephone Encounter (Signed)
Returned call to patient no answer.Left message on personal voice mail schedulers will call back tomorrow 09/16/13 to schedule stress myoview

## 2013-09-15 NOTE — Telephone Encounter (Signed)
New Prob     Pt was recently at [facility] and was seen by Dr. Clifton James 2 times. Pt was told her needed to have some kind of test done (pt is not sure what tests need to be done), but no orders in system to schedule.

## 2013-09-15 NOTE — Telephone Encounter (Signed)
Primary Cardiologist is Dr. Swaziland

## 2013-09-29 ENCOUNTER — Ambulatory Visit (HOSPITAL_COMMUNITY): Payer: Medicare Other | Attending: Cardiology | Admitting: Radiology

## 2013-09-29 VITALS — BP 151/73 | HR 56 | Ht 65.0 in | Wt 134.0 lb

## 2013-09-29 DIAGNOSIS — Z8249 Family history of ischemic heart disease and other diseases of the circulatory system: Secondary | ICD-10-CM | POA: Insufficient documentation

## 2013-09-29 DIAGNOSIS — F1310 Sedative, hypnotic or anxiolytic abuse, uncomplicated: Secondary | ICD-10-CM | POA: Insufficient documentation

## 2013-09-29 DIAGNOSIS — R42 Dizziness and giddiness: Secondary | ICD-10-CM | POA: Insufficient documentation

## 2013-09-29 DIAGNOSIS — I10 Essential (primary) hypertension: Secondary | ICD-10-CM

## 2013-09-29 DIAGNOSIS — R55 Syncope and collapse: Secondary | ICD-10-CM | POA: Insufficient documentation

## 2013-09-29 DIAGNOSIS — I6529 Occlusion and stenosis of unspecified carotid artery: Secondary | ICD-10-CM | POA: Insufficient documentation

## 2013-09-29 DIAGNOSIS — I4510 Unspecified right bundle-branch block: Secondary | ICD-10-CM | POA: Insufficient documentation

## 2013-09-29 MED ORDER — REGADENOSON 0.4 MG/5ML IV SOLN
0.4000 mg | Freq: Once | INTRAVENOUS | Status: AC
  Administered 2013-09-29: 0.4 mg via INTRAVENOUS

## 2013-09-29 MED ORDER — TECHNETIUM TC 99M SESTAMIBI GENERIC - CARDIOLITE
33.0000 | Freq: Once | INTRAVENOUS | Status: AC | PRN
  Administered 2013-09-29: 33 via INTRAVENOUS

## 2013-09-29 MED ORDER — TECHNETIUM TC 99M SESTAMIBI GENERIC - CARDIOLITE
11.0000 | Freq: Once | INTRAVENOUS | Status: AC | PRN
  Administered 2013-09-29: 11 via INTRAVENOUS

## 2013-09-29 NOTE — Progress Notes (Signed)
[facility] SITE 3 NUCLEAR MED [address] [place], [place] [address] [phone]    Cardiology Nuclear Med Study  Gerold Sar is a 62 y.o. male     MRN : [id number]     DOB: [birth year]  Procedure Date: 09/29/2013  Nuclear Med Background Indication for Stress Test:  Evaluation for Ischemia and Post Hospital-9/14 Pre-syncope, Increased enzymes History:  21014 Echo EF 55-60%, mod AS, 2012 MPS Abnormal, EF 81%, CT- Coronary Calcifications Cardiac Risk Factors: Carotid Disease, Family History - CAD, Hypertension, Lipids, RBBB and Smoker  Symptoms:  Dizziness, Light-Headedness and Near Syncope   Nuclear Pre-Procedure Caffeine/Decaff Intake:  None NPO After: 9:00pm    O2 Sat: 97% on room air. IV 0.9% NS with Angio Cath:  22g  IV Site: R Antecubital  IV Started by:  Bonnita Levan, RN  Chest Size (in):  42 Cup Size: n/a  Height: 5\' 5"  (1.651 m)  Weight:  134 lb (60.782 kg)  BMI:  Body mass index is 22.3 kg/(m^2). Tech Comments:  N/A    Nuclear Med Study 1 or 2 day study: 1 day  Stress Test Type:  Lexiscan  Reading MD: Kristeen Miss, MD  Order Authorizing Provider:  Peter Swaziland, MD  Resting Radionuclide: Technetium 53m Sestamibi  Resting Radionuclide Dose: 11.0 mCi   Stress Radionuclide:  Technetium 51m Sestamibi  Stress Radionuclide Dose: 33.0 mCi           Stress Protocol Rest HR: 56 Stress HR: 90  Rest BP: 151/73 Stress BP: 156/69  Exercise Time (min): n/a METS: n/a   Predicted Max HR: 157 bpm % Max HR: 57.32 bpm Rate Pressure Product: 57846   Dose of Adenosine (mg):  n/a Dose of Lexiscan: 0.4 mg  Dose of Atropine (mg): n/a Dose of Dobutamine: n/a mcg/kg/min (at max HR)  Stress Test Technologist: Nelson Chimes, BS-ES  Nuclear Technologist:  Doyne Keel, CNMT     Rest Procedure:  Myocardial perfusion imaging was performed at rest 45 minutes following the intravenous administration of Technetium 96m Sestamibi. Rest ECG: NSR-RBBB  Stress Procedure:   The patient received IV Lexiscan 0.4 mg over 15-seconds.  Patient experienced stomach "hurting" that resolved in recovery. Technetium 55m Sestamibi injected at 30-seconds.  Quantitative spect images were obtained after a 45 minute delay. Stress ECG: No significant change from baseline ECG  QPS Raw Data Images:  Normal; no motion artifact; normal heart/lung ratio. Stress Images:  there is a small- medium sized area of moderately severe attenuation at the apex with normal uptake in the other regions.   Rest Images:  Normal homogeneous uptake in all areas of the myocardium. Subtraction (SDS):  There is a small area of reversible ischemia in the apex.   Transient Ischemic Dilatation (Normal <1.22):  N/A Lung/Heart Ratio (Normal <0.45):  0.35  Quantitative Gated Spect Images QGS EDV:  91 ml QGS ESV:  21 ml  Impression Exercise Capacity:  Lexiscan with no exercise. BP Response:  Normal blood pressure response. Clinical Symptoms:  No significant symptoms noted. ECG Impression:  No significant ST segment change suggestive of ischemia. Comparison with Prior Nuclear Study: 2012  Overall Impression:  Intermediate risk stress nuclear study .  There is a small- medium sized area of ischemia at the apex. .  LV Ejection Fraction: 77%.  LV Wall Motion:  NL LV Function; NL Wall Motion.    Vesta Mixer, Montez Hageman., MD, [facility] 09/29/2013, 4:56 PM Office - [phone] Pager [phone]

## 2013-10-05 ENCOUNTER — Ambulatory Visit (INDEPENDENT_AMBULATORY_CARE_PROVIDER_SITE_OTHER): Payer: Medicare Other | Admitting: Cardiology

## 2013-10-05 ENCOUNTER — Telehealth: Payer: Self-pay | Admitting: Cardiology

## 2013-10-05 ENCOUNTER — Encounter: Payer: Self-pay | Admitting: Cardiology

## 2013-10-05 VITALS — BP 160/98 | HR 80 | Ht 65.0 in | Wt 140.0 lb

## 2013-10-05 DIAGNOSIS — F17200 Nicotine dependence, unspecified, uncomplicated: Secondary | ICD-10-CM

## 2013-10-05 DIAGNOSIS — I350 Nonrheumatic aortic (valve) stenosis: Secondary | ICD-10-CM

## 2013-10-05 DIAGNOSIS — I2510 Atherosclerotic heart disease of native coronary artery without angina pectoris: Secondary | ICD-10-CM

## 2013-10-05 DIAGNOSIS — I359 Nonrheumatic aortic valve disorder, unspecified: Secondary | ICD-10-CM

## 2013-10-05 DIAGNOSIS — E785 Hyperlipidemia, unspecified: Secondary | ICD-10-CM

## 2013-10-05 DIAGNOSIS — I10 Essential (primary) hypertension: Secondary | ICD-10-CM

## 2013-10-05 DIAGNOSIS — J189 Pneumonia, unspecified organism: Secondary | ICD-10-CM

## 2013-10-05 NOTE — Progress Notes (Signed)
Gregory [name] Date of Birth: 1951-06-11 Medical Record #[id number]  History of Present Illness: Gregory [name] is seen today for followup after recent hospitalization. He was admitted on September 9. He had had a fall associated with dizziness. He was diagnosed with pneumonia. He injured his ribs. He had mildly elevated troponins. An echocardiogram was obtained and showed moderate aortic stenosis. LV function was normal. It was recommended that he have a followup Myoview study as outpatient. He has a known history of coronary disease. This is based on a prior CT of the chest showing extensive coronary calcification. He had a Myoview study in 2012 which showed an area of apical ischemia. This was relatively small in his LV function was normal so we elected to continue medical management. He really denies any significant symptoms of chest pain. His blood pressure has been consistently elevated at home in the 150 systolic range. He does report some asthma for which she uses when necessary inhalers. He continues to smoke.  Current Outpatient Prescriptions on File Prior to Visit  Medication Sig Dispense Refill  . albuterol (PROVENTIL) (5 MG/ML) 0.5% nebulizer solution Take 0.5 mLs (2.5 mg total) by nebulization every 2 (two) hours as needed for wheezing.  20 mL  12  . amLODipine (NORVASC) 10 MG tablet Take 1 tablet (10 mg total) by mouth daily.  30 tablet  2  . aspirin EC 81 MG tablet Take 1 tablet (81 mg total) by mouth daily.  30 tablet  0  . atorvastatin (LIPITOR) 20 MG tablet Take 1 tablet (20 mg total) by mouth daily.  90 tablet  3  . divalproex (DEPAKOTE) 500 MG DR tablet Take 4 tablets (2,000 mg total) by mouth at bedtime.  480 tablet  3  . fluticasone (FLONASE) 50 MCG/ACT nasal spray Place 2 sprays into the nose daily.  48 g  3  . Fluticasone-Salmeterol (ADVAIR DISKUS) 500-50 MCG/DOSE AEPB Inhale 1 puff into the lungs every 12 (twelve) hours.  180 each  3  . lamoTRIgine (LAMICTAL) 200 MG tablet  Take 1 tablet (200 mg total) by mouth every morning.  90 tablet  3  . Multiple Vitamin (MULTIVITAMIN WITH MINERALS) TABS tablet Take 1 tablet by mouth daily.      . metoprolol tartrate (LOPRESSOR) 25 MG tablet Take 1 tablet (25 mg total) by mouth 2 (two) times daily.  60 tablet  2   No current facility-administered medications on file prior to visit.    Allergies  Allergen Reactions  . Codeine Anaphylaxis  . Amoxicillin     intolerance  . Augmentin [Amoxicillin-Pot Clavulanate]     nausea    Past Medical History  Diagnosis Date  . Allergy   . Hypertension   . Bipolar disorder   . Alcohol abuse   . Tobacco use disorder   . Sleep apnea   . Asthma   . Aortic stenosis   . Neuromuscular disorder   . Family history of skin cancer   . Hyperlipidemia   . RBBB     a. 7.2012 low risk nuclear exam, sm area of apical ischemia.  . Carotid artery occlusion   . Claudication   . Smoker   . Chronic back pain   . CAD (coronary artery disease)     Past Surgical History  Procedure Laterality Date  . Colonoscopy  2009  . Vasectomy reversal    . US echocardiography  06/20/2010    EF 55-60%  . Anal fistulectomy  09/25/11  . Vasectomy    .  Elbow surgery      bilaterally for cubital tunnel    History  Smoking status  . Current Every Day Smoker -- 1.00 packs/day for 40 years  . Types: Cigarettes  Smokeless tobacco  . Never Used    History  Alcohol Use  . 14.0 oz/week  . 28 drink(s) per week    Comment: 2 glasses of wine per night.    Family History  Problem Relation Age of Onset  . Stroke Father 74  . Cancer Father     skin  . Hypertension Brother   . Diabetes Brother   . Cancer Maternal Aunt     skin  . Cancer Maternal Grandmother     liver    Review of Systems: As noted in history of present illness.  All other systems were reviewed and are negative.  Physical Exam: BP 160/98  Pulse 80  Ht 5\' 5"  (1.651 m)  Wt 140 lb (63.504 kg)  BMI 23.3 kg/m2 He is a  pleasant white male in no acute distress. HEENT: Oral cephalic, atraumatic. Pupils equal round and reactive to light accommodation. Extraocular movements are full. Oropharynx is clear. Neck is supple without JVD, adenopathy, or thyromegaly. He has mild bilateral carotid bruits. Lungs: Clear Cardiovascular: Regular rate and rhythm. Normal S1 and S2. He has a grade 2/6 systolic murmur in the right upper sternal border radiating to the apex and towards the carotids. Abdomen: Soft and nontender. I'll sounds are positive. No masses or bruits. Extremities: Femoral pulses are 2+ and symmetric. Distal pulses are trace to 1+. He has no edema. Neurologic exam: Alert and oriented x3. Cranial nerves II through XII are intact.  LABORATORY DATA: ECG in September shows normal sinus rhythm with a left anterior fascicular block and right bundle branch block.  Cardiology Nuclear Med Study  Gregory [name] is a 62 y.o. male MRN : [id number] DOB: [birth year]  Procedure Date: 09/29/2013  Nuclear Med Background  Indication for Stress Test: Evaluation for Ischemia and Post Hospital-[facility] Pre-syncope, Increased enzymes  History: 40981 Echo EF 55-60%, mod AS, 2012 MPS Abnormal, EF 81%, CT- Coronary Calcifications  Cardiac Risk Factors: Carotid Disease, Family History - CAD, Hypertension, Lipids, RBBB and Smoker  Symptoms: Dizziness, Light-Headedness and Near Syncope  Nuclear Pre-Procedure  Caffeine/Decaff Intake: None  NPO After: 9:00pm   O2 Sat: 97% on room air.  IV 0.9% NS with Angio Cath: 22g   IV Site: R Antecubital  IV Started by: Gregory Levan, RN   Chest Size (in): 42  Cup Size: n/a   Height: 5\' 5"  (1.651 m)  Weight: 134 lb (60.782 kg)   BMI: Body mass index is 22.3 kg/(m^2).  Tech Comments: N/A   Nuclear Med Study  1 or 2 day study: 1 day  Stress Test Type: Lexiscan   Reading MD: Gregory Miss, MD  Order Authorizing Provider: Singleton Hickox [place], MD   Resting Radionuclide: Technetium 6m Sestamibi  Resting  Radionuclide Dose: 11.0 mCi   Stress Radionuclide: Technetium 54m Sestamibi  Stress Radionuclide Dose: 33.0 mCi   Stress Protocol  Rest HR: 56  Stress HR: 90   Rest BP: 151/73  Stress BP: 156/69   Exercise Time (min): n/a  METS: n/a   Predicted Max HR: 157 bpm  % Max HR: 57.32 bpm  Rate Pressure Product: 19147  Dose of Adenosine (mg): n/a  Dose of Lexiscan: 0.4 mg   Dose of Atropine (mg): n/a  Dose of Dobutamine: n/a mcg/kg/min (at max HR)   Stress  Test Technologist: Gregory [name], BS-ES  Nuclear Technologist: Gregory [name], CNMT   Rest Procedure: Myocardial perfusion imaging was performed at rest 45 minutes following the intravenous administration of Technetium 59m Sestamibi.  Rest ECG: NSR-RBBB  Stress Procedure: The patient received IV Lexiscan 0.4 mg over 15-seconds. Patient experienced stomach "hurting" that resolved in recovery. Technetium 59m Sestamibi injected at 30-seconds. Quantitative spect images were obtained after a 45 minute delay.  Stress ECG: No significant change from baseline ECG  QPS  Raw Data Images: Normal; no motion artifact; normal heart/lung ratio.  Stress Images: there is a small- medium sized area of moderately severe attenuation at the apex with normal uptake in the other regions.  Rest Images: Normal homogeneous uptake in all areas of the myocardium.  Subtraction (SDS): There is a small area of reversible ischemia in the apex.  Transient Ischemic Dilatation (Normal <1.22): N/A  Lung/Heart Ratio (Normal <0.45): 0.35  Quantitative Gated Spect Images  QGS EDV: 91 ml  QGS ESV: 21 ml  Impression  Exercise Capacity: Lexiscan with no exercise.  BP Response: Normal blood pressure response.  Clinical Symptoms: No significant symptoms noted.  ECG Impression: No significant ST segment change suggestive of ischemia.  Comparison with Prior Nuclear Study: 2012  Overall Impression: Intermediate risk stress nuclear study . There is a small- medium sized area of ischemia at  the apex. .  LV Ejection Fraction: 77%. LV Wall Motion: NL LV Function; NL Wall Motion.  Gregory Grove., MD, [facility]   Assessment / Plan: 1. Coronary disease. Recent hospitalization with mildly elevated troponins probably related to demand ischemia in the setting of pneumonia. Recent Myoview study shows a small area of ischemia in the apex. This is unchanged from 2012. He really has no clinical symptoms of angina. Overall this is a low risk study and I would recommend continued medical management given his lack of symptoms. Recommend continued aspirin, amlodipine, and statin therapy. We will add metoprolol 25 mg twice a day for blood pressure control. I will followup again in 6 months.  2. Hypertension-control is not optimal. We'll add beta blocker therapy to his ACE inhibitor and amlodipine.  3. Moderate aortic stenosis. Compared to prior echo study in 2011 his mean gradient has increased from 14-24 mm of mercury. Peak gradient has increased from 24-39 mmHg. Valve area has decreased from 1.5-1.1 cm square. He is asymptomatic. We will monitor. Consider repeat echocardiogram in one year.  4. PAD with chronic claudication.  5. Tobacco abuse. Have strongly encouraged smoking cessation.  6. Hyperlipidemia.  7. Right bundle branch block with left anterior fascicular block-chronic  8. Bipolar disorder.  9. Recent pneumonia.

## 2013-10-05 NOTE — Patient Instructions (Addendum)
Go ahead and start metoprolol 25 mg twice a day in addition to your other medications  Stop smoking  I will see you in 6 months.  Your physician wants you to follow-up in: 6 months.  You will receive a reminder letter in the mail two months in advance. If you don't receive a letter, please call our office to schedule the follow-up appointment.

## 2013-10-05 NOTE — Telephone Encounter (Signed)
Pt needs RF on Metoprolo. Please call

## 2013-10-06 ENCOUNTER — Other Ambulatory Visit: Payer: Self-pay

## 2013-10-06 MED ORDER — METOPROLOL TARTRATE 25 MG PO TABS: 25.0000 mg | ORAL_TABLET | Freq: Two times a day (BID) | ORAL | Status: DC

## 2013-10-06 NOTE — Telephone Encounter (Signed)
Follow up     90 days supply Metoprolo Triate  25 mg .

## 2013-10-10 ENCOUNTER — Encounter: Payer: Self-pay | Admitting: Cardiology

## 2013-10-16 ENCOUNTER — Encounter (HOSPITAL_COMMUNITY): Payer: Self-pay | Admitting: Emergency Medicine

## 2013-10-16 ENCOUNTER — Emergency Department (HOSPITAL_COMMUNITY)
Admission: EM | Admit: 2013-10-16 | Discharge: 2013-10-16 | Disposition: A | Discharge status: Home / Self Care | Payer: Medicare Other | Attending: Emergency Medicine | Admitting: Emergency Medicine

## 2013-10-16 DIAGNOSIS — Z79899 Other long term (current) drug therapy: Secondary | ICD-10-CM | POA: Insufficient documentation

## 2013-10-16 DIAGNOSIS — F17200 Nicotine dependence, unspecified, uncomplicated: Secondary | ICD-10-CM | POA: Insufficient documentation

## 2013-10-16 DIAGNOSIS — G4700 Insomnia, unspecified: Secondary | ICD-10-CM

## 2013-10-16 DIAGNOSIS — Z880 Allergy status to penicillin: Secondary | ICD-10-CM | POA: Insufficient documentation

## 2013-10-16 DIAGNOSIS — F319 Bipolar disorder, unspecified: Secondary | ICD-10-CM

## 2013-10-16 DIAGNOSIS — Z8669 Personal history of other diseases of the nervous system and sense organs: Secondary | ICD-10-CM | POA: Insufficient documentation

## 2013-10-16 DIAGNOSIS — F1010 Alcohol abuse, uncomplicated: Secondary | ICD-10-CM

## 2013-10-16 DIAGNOSIS — I2510 Atherosclerotic heart disease of native coronary artery without angina pectoris: Secondary | ICD-10-CM | POA: Insufficient documentation

## 2013-10-16 DIAGNOSIS — E785 Hyperlipidemia, unspecified: Secondary | ICD-10-CM | POA: Insufficient documentation

## 2013-10-16 DIAGNOSIS — F1020 Alcohol dependence, uncomplicated: Secondary | ICD-10-CM | POA: Insufficient documentation

## 2013-10-16 DIAGNOSIS — J45909 Unspecified asthma, uncomplicated: Secondary | ICD-10-CM | POA: Insufficient documentation

## 2013-10-16 DIAGNOSIS — I10 Essential (primary) hypertension: Secondary | ICD-10-CM | POA: Insufficient documentation

## 2013-10-16 DIAGNOSIS — Z7982 Long term (current) use of aspirin: Secondary | ICD-10-CM | POA: Insufficient documentation

## 2013-10-16 DIAGNOSIS — IMO0002 Reserved for concepts with insufficient information to code with codable children: Secondary | ICD-10-CM | POA: Insufficient documentation

## 2013-10-16 DIAGNOSIS — F32A Depression, unspecified: Secondary | ICD-10-CM

## 2013-10-16 DIAGNOSIS — F329 Major depressive disorder, single episode, unspecified: Secondary | ICD-10-CM

## 2013-10-16 DIAGNOSIS — Z8673 Personal history of transient ischemic attack (TIA), and cerebral infarction without residual deficits: Secondary | ICD-10-CM | POA: Insufficient documentation

## 2013-10-16 LAB — COMPREHENSIVE METABOLIC PANEL
ALT: 29 U/L (ref 0–53)
AST: 43 U/L — ABNORMAL HIGH (ref 0–37)
Albumin: 3.5 g/dL (ref 3.5–5.2)
Alkaline Phosphatase: 89 U/L (ref 39–117)
BUN: 6 mg/dL (ref 6–23)
CO2: 27 mEq/L (ref 19–32)
Calcium: 9.1 mg/dL (ref 8.4–10.5)
Chloride: 96 mEq/L (ref 96–112)
Creatinine, Ser: 0.67 mg/dL (ref 0.50–1.35)
GFR calc Af Amer: >90 mL/min (ref >90)
GFR calc non Af Amer: >90 mL/min (ref >90)
Glucose, Bld: 105 mg/dL — ABNORMAL HIGH (ref 70–99)
Potassium: 4.1 mEq/L (ref 3.5–5.1)
Sodium: 137 mEq/L (ref 135–145)
Total Bilirubin: 0.2 mg/dL — ABNORMAL LOW (ref 0.3–1.2)
Total Protein: 7.6 g/dL (ref 6.0–8.3)

## 2013-10-16 LAB — VALPROIC ACID LEVEL: Valproic Acid Lvl: 30.6 ug/mL — ABNORMAL LOW (ref 50.0–100.0)

## 2013-10-16 LAB — CBC
HCT: 45.5 % (ref 39.0–52.0)
Hemoglobin: 16.2 g/dL (ref 13.0–17.0)
MCH: 34.5 pg — ABNORMAL HIGH (ref 26.0–34.0)
MCHC: 35.6 g/dL (ref 30.0–36.0)
MCV: 96.8 fL (ref 78.0–100.0)
Platelets: 214 10*3/uL (ref 150–400)
RBC: 4.7 MIL/uL (ref 4.22–5.81)
RDW: 13.4 % (ref 11.5–15.5)
WBC: 7.5 10*3/uL (ref 4.0–10.5)

## 2013-10-16 LAB — SALICYLATE LEVEL: Salicylate Lvl: <2 mg/dL — ABNORMAL LOW (ref 2.8–20.0)

## 2013-10-16 LAB — RAPID URINE DRUG SCREEN, HOSP PERFORMED
Amphetamines: NOT DETECTED
Barbiturates: NOT DETECTED
Benzodiazepines: NOT DETECTED
Cocaine: NOT DETECTED
Opiates: NOT DETECTED
Tetrahydrocannabinol: NOT DETECTED

## 2013-10-16 LAB — ACETAMINOPHEN LEVEL: Acetaminophen (Tylenol), Serum: <15 ug/mL (ref 10–30)

## 2013-10-16 LAB — ETHANOL: Alcohol, Ethyl (B): 300 mg/dL — ABNORMAL HIGH (ref 0–11)

## 2013-10-16 MED ORDER — ASPIRIN EC 81 MG PO TBEC
81.0000 mg | DELAYED_RELEASE_TABLET | Freq: Every day | ORAL | Status: DC
  Administered 2013-10-16: 81 mg via ORAL
  Filled 2013-10-16: qty 1

## 2013-10-16 MED ORDER — METOPROLOL TARTRATE 25 MG PO TABS
25.0000 mg | ORAL_TABLET | Freq: Two times a day (BID) | ORAL | Status: DC
  Filled 2013-10-16: qty 1

## 2013-10-16 MED ORDER — NICOTINE 21 MG/24HR TD PT24
21.0000 mg | MEDICATED_PATCH | Freq: Every day | TRANSDERMAL | Status: DC
  Administered 2013-10-16: 21 mg via TRANSDERMAL
  Filled 2013-10-16: qty 1

## 2013-10-16 MED ORDER — ATORVASTATIN CALCIUM 20 MG PO TABS
20.0000 mg | ORAL_TABLET | Freq: Every day | ORAL | Status: DC
  Filled 2013-10-16: qty 1

## 2013-10-16 MED ORDER — LISINOPRIL 20 MG PO TABS
20.0000 mg | ORAL_TABLET | Freq: Every day | ORAL | Status: DC
  Administered 2013-10-16: 20 mg via ORAL
  Filled 2013-10-16: qty 1

## 2013-10-16 MED ORDER — AMLODIPINE BESYLATE 10 MG PO TABS
10.0000 mg | ORAL_TABLET | Freq: Every day | ORAL | Status: DC
  Administered 2013-10-16: 10 mg via ORAL
  Filled 2013-10-16: qty 1

## 2013-10-16 MED ORDER — MOMETASONE FURO-FORMOTEROL FUM 200-5 MCG/ACT IN AERO
2.0000 | INHALATION_SPRAY | Freq: Two times a day (BID) | RESPIRATORY_TRACT | Status: DC
  Filled 2013-10-16: qty 8.8

## 2013-10-16 MED ORDER — LORAZEPAM 1 MG PO TABS: 1.0000 mg | ORAL_TABLET | Freq: Three times a day (TID) | ORAL | Status: DC | PRN

## 2013-10-16 MED ORDER — ONDANSETRON HCL 4 MG PO TABS: 4.0000 mg | ORAL_TABLET | Freq: Three times a day (TID) | ORAL | Status: DC | PRN

## 2013-10-16 MED ORDER — LAMOTRIGINE 200 MG PO TABS
200.0000 mg | ORAL_TABLET | Freq: Every morning | ORAL | Status: DC
  Administered 2013-10-16: 200 mg via ORAL
  Filled 2013-10-16: qty 1

## 2013-10-16 MED ORDER — ALBUTEROL SULFATE HFA 108 (90 BASE) MCG/ACT IN AERS: 2.0000 | INHALATION_SPRAY | Freq: Four times a day (QID) | RESPIRATORY_TRACT | Status: DC | PRN

## 2013-10-16 MED ORDER — HYDROCHLOROTHIAZIDE 12.5 MG PO CAPS
12.5000 mg | ORAL_CAPSULE | Freq: Every day | ORAL | Status: DC
  Administered 2013-10-16: 12.5 mg via ORAL
  Filled 2013-10-16: qty 1

## 2013-10-16 NOTE — BH Assessment (Signed)
Attempted tele-assessment at assigned time of 0155. Ardelia Mems, RN said Pt is still in process of being transferred to psych ed and she will call TTS when Pt is ready.  Harlin Rain Ria Comment, [facility] Triage Specialist

## 2013-10-16 NOTE — ED Notes (Signed)
Pt has no clothing/belongings here and has tried to contact his wife numerous times w/o results.

## 2013-10-16 NOTE — ED Notes (Signed)
Patient presents calm and cooperative; denies any self harm thoughts, denies any thoughts of wanting to harm others, denies any auditory or visual hallucinations; denies any current pain; patients acknowledges panic attacks that happen around 0100 every morning x 1 month;adknowledges not wearing his CPAP machine x 6 months and not taking his medications x 1 week.

## 2013-10-16 NOTE — ED Notes (Signed)
Dr Mervyn Skeeters adn shuvon into see

## 2013-10-16 NOTE — Consult Note (Signed)
Per psychiatry request TTS left a message for Dr. Evelene Croon about pt following up with her.  Pt indicated this was appropriate.  Pt to be discharged home and will follow up with PCP on Monday at 0830 and call Dr. Evelene Croon as well.

## 2013-10-16 NOTE — ED Provider Notes (Signed)
CSN: [id number]     Arrival date & time 10/16/13  0014 History   First MD Initiated Contact with Patient 10/16/13 0122     Chief Complaint  Patient presents with  . Shortness of Breath  . Medical Clearance   (Consider location/radiation/quality/duration/timing/severity/associated sxs/prior Treatment) HPI Comments: Patient presents tonight with his wife with suicidal ideation.  No specific plan.  He, states he is bipolar and has not been taking his medications for the past 3, weeks, although he did take a dose tonight.  His wife has been traveling and returned home yesterday and noticed, that he has been drinking wine, to excess.  There number of bottles in the trash can and tonight.  He expressed a desire to end it all to her.  She immediately brought him to the emergency department for evaluation.  He has been seeing a psychiatrist, but this has been decreased to once yearly.  His last evaluation/visit with her was approximately 3 months ago  Patient is a 62 y.o. male presenting with shortness of breath. The history is provided by the patient.  Shortness of Breath Severity:  Moderate Onset quality:  Gradual Duration:  3 weeks Timing:  Constant Progression:  Worsening Chronicity:  Recurrent Relieved by:  Nothing Worsened by:  Nothing tried Associated symptoms: no abdominal pain, no cough, no fever, no headaches, no rash and no wheezing     Past Medical History  Diagnosis Date  . Allergy   . Hypertension   . Bipolar disorder   . Alcohol abuse   . Tobacco use disorder   . Sleep apnea   . Asthma   . Aortic stenosis   . Neuromuscular disorder   . Family history of skin cancer   . Hyperlipidemia   . RBBB     a. 7.2012 low risk nuclear exam, sm area of apical ischemia.  . Carotid artery occlusion   . Claudication   . Smoker   . Chronic back pain   . CAD (coronary artery disease)    Past Surgical History  Procedure Laterality Date  . Colonoscopy  2009  . Vasectomy reversal     . US echocardiography  06/20/2010    EF 55-60%  . Anal fistulectomy  09/25/11  . Vasectomy    . Elbow surgery      bilaterally for cubital tunnel   Family History  Problem Relation Age of Onset  . Stroke Father 56  . Cancer Father     skin  . Hypertension Brother   . Diabetes Brother   . Cancer Maternal Aunt     skin  . Cancer Maternal Grandmother     liver   History  Substance Use Topics  . Smoking status: Current Every Day Smoker -- 1.00 packs/day for 40 years    Types: Cigarettes  . Smokeless tobacco: Never Used  . Alcohol Use: 14.0 oz/week    28 drink(s) per week     Comment: 2 glasses of wine per night.    Review of Systems  Constitutional: Negative for fever.  Respiratory: Positive for shortness of breath. Negative for cough and wheezing.   Gastrointestinal: Negative for abdominal pain.  Skin: Negative for rash and wound.  Neurological: Negative for dizziness and headaches.  Psychiatric/Behavioral: Positive for suicidal ideas.  All other systems reviewed and are negative.    Allergies  Codeine; Amoxicillin; and Augmentin  Home Medications   Current Outpatient Rx  Name  Route  Sig  Dispense  Refill  . albuterol (  PROVENTIL HFA;VENTOLIN HFA) 108 (90 BASE) MCG/ACT inhaler   Inhalation   Inhale 2 puffs into the lungs every 6 (six) hours as needed for wheezing.         [name] amLODipine (NORVASC) 10 MG tablet   Oral   Take 1 tablet (10 mg total) by mouth daily.   30 tablet   2   . aspirin EC 81 MG tablet   Oral   Take 1 tablet (81 mg total) by mouth daily.   30 tablet   0   . atorvastatin (LIPITOR) 20 MG tablet   Oral   Take 1 tablet (20 mg total) by mouth daily.   90 tablet   3   . divalproex (DEPAKOTE) 500 MG DR tablet   Oral   Take 4 tablets (2,000 mg total) by mouth at bedtime.   480 tablet   3   . fluticasone (FLONASE) 50 MCG/ACT nasal spray   Nasal   Place 2 sprays into the nose daily.   48 g   3   . Fluticasone-Salmeterol (ADVAIR  DISKUS) 500-50 MCG/DOSE AEPB   Inhalation   Inhale 1 puff into the lungs every 12 (twelve) hours.   180 each   3   . lamoTRIgine (LAMICTAL) 200 MG tablet   Oral   Take 1 tablet (200 mg total) by mouth every morning.   90 tablet   3   . lisinopril-hydrochlorothiazide (PRINZIDE,ZESTORETIC) 20-12.5 MG per tablet   Oral   Take 1 tablet by mouth every morning.          . metoprolol tartrate (LOPRESSOR) 25 MG tablet   Oral   Take 1 tablet (25 mg total) by mouth 2 (two) times daily.   60 tablet   6   . Multiple Vitamin (MULTIVITAMIN WITH MINERALS) TABS tablet   Oral   Take 1 tablet by mouth every morning.           BP 165/99  Pulse 91  Temp(Src) 98 F (36.7 C) (Oral)  Resp 17  Wt 137 lb (62.143 kg)  BMI 22.8 kg/m2  SpO2 96% Physical Exam  Nursing note and vitals reviewed. Constitutional: He is oriented to person, place, and time. He appears well-developed and well-nourished.  HENT:  Head: Normocephalic.  Eyes: Pupils are equal, round, and reactive to light.  Neck: Normal range of motion.  Cardiovascular: Normal rate and regular rhythm.   Pulmonary/Chest: Effort normal and breath sounds normal. He has no wheezes.  Neurological: He is alert and oriented to person, place, and time.  Skin: Skin is warm.  Psychiatric: His speech is normal. Cognition and memory are normal. He expresses inappropriate judgment. He exhibits a depressed mood. He expresses suicidal ideation. He expresses no suicidal plans.    ED Course  Procedures (including critical care time) Labs Review Labs Reviewed  CBC - Abnormal; Notable for the following:    MCH 34.5 (*)    All other components within normal limits  COMPREHENSIVE METABOLIC PANEL - Abnormal; Notable for the following:    Glucose, Bld 105 (*)    AST 43 (*)    Total Bilirubin 0.2 (*)    All other components within normal limits  ETHANOL - Abnormal; Notable for the following:    Alcohol, Ethyl (B) 300 (*)    All other components  within normal limits  SALICYLATE LEVEL - Abnormal; Notable for the following:    Salicylate Lvl <2.0 (*)    All other components within normal limits  VALPROIC ACID LEVEL - Abnormal; Notable for the following:    Valproic Acid Lvl 30.6 (*)    All other components within normal limits  ACETAMINOPHEN LEVEL  URINE RAPID DRUG SCREEN (HOSP PERFORMED)   Imaging Review No results found.  EKG Interpretation   None       MDM   1. Alcohol dependence   2. Bipolar disorder   3. Depression   4. Insomnia    Patient has been seen by TTS and feel that he will benefit from seeing a psychiatrist in the morning.  He does not meet criteria for admission.  He does not have a suicidal plan.  He can contract for safety, but the counselor.  Does not feel 100% safe discharging the patient.  Without evaluation by psychiatry     Arman Filter, NP 10/16/13 1951

## 2013-10-16 NOTE — ED Notes (Signed)
Pt arrive to the Ed with multiple complaints. Pt was brought in by his wife who is upset tha the pt is drinking excessively during her absence during the week.  Pt is on medications for a frontal lobe situation which he has not been taking.  Pt's wife states that the pt sounded congested and short of breath prior to arrival.  Pt's wife stated he has taken his medications prior to coming to the hospital which include a rescue inhaler. Pt's lungs sounds are clear.

## 2013-10-16 NOTE — ED Notes (Signed)
Wife is here to pick pt up 

## 2013-10-16 NOTE — ED Notes (Signed)
Patient denies any problems with alcohol states that he only drinks around 2 glasses of wine per night.

## 2013-10-16 NOTE — ED Notes (Signed)
Up to the desk on the phone 

## 2013-10-16 NOTE — ED Notes (Signed)
Pt is unable to urinate at this time. 

## 2013-10-16 NOTE — ED Notes (Signed)
Wife phone # [phone]

## 2013-10-16 NOTE — ED Provider Notes (Signed)
10:24 AM  Pt seen by psychiatry. Patient has been valuated and psychiatry (Dr. Channing Mutters and Lenard Simmer, NP) and they feel he is safe for discharge home with outpatient followup with his psychiatrist, Dr. Sharl Ma, tomorrow.  I was not directly involved in this patient's care.  Layla Maw Domini Vandehei, DO 10/16/13 1026

## 2013-10-16 NOTE — ED Notes (Signed)
TTS into see 
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
## 2013-10-16 NOTE — ED Notes (Signed)
Up to the bathroom 
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
## 2013-10-16 NOTE — ED Notes (Addendum)
Written dc instructions reviewed w/ pt.  Pt encouraged to follow up w/ his MD and to take his medications as directed.  Pt reports that he has an appt w/ his PMD tomorrow and that he will contact his psych this week for follow up.  Pt encouraged to return/seek treatment if he developes suicidal/homicidal thoughts or urges. Pt verbalized understanding.  Pt's wife is here to pick him up and has brought his clothes.  Pt to change after leaving the unit. OP referals given to pt by TTS.

## 2013-10-17 ENCOUNTER — Ambulatory Visit: Payer: Medicare Other | Admitting: Family Medicine

## 2013-10-17 NOTE — ED Provider Notes (Signed)
Medical screening examination/treatment/procedure(s) were performed by non-physician practitioner and as supervising physician I was immediately available for consultation/collaboration.   Shakeema Lippman, MD 10/17/13 1114 

## 2013-10-18 ENCOUNTER — Encounter: Payer: Self-pay | Admitting: Family Medicine

## 2013-10-18 ENCOUNTER — Ambulatory Visit (INDEPENDENT_AMBULATORY_CARE_PROVIDER_SITE_OTHER): Payer: Medicare Other | Admitting: Family Medicine

## 2013-10-18 VITALS — BP 120/70 | Wt 135.0 lb

## 2013-10-18 DIAGNOSIS — F1020 Alcohol dependence, uncomplicated: Secondary | ICD-10-CM

## 2013-10-18 DIAGNOSIS — I10 Essential (primary) hypertension: Secondary | ICD-10-CM

## 2013-10-18 DIAGNOSIS — G8929 Other chronic pain: Secondary | ICD-10-CM

## 2013-10-18 DIAGNOSIS — S20212S Contusion of left front wall of thorax, sequela: Secondary | ICD-10-CM

## 2013-10-18 DIAGNOSIS — F319 Bipolar disorder, unspecified: Secondary | ICD-10-CM

## 2013-10-18 DIAGNOSIS — G4730 Sleep apnea, unspecified: Secondary | ICD-10-CM

## 2013-10-18 DIAGNOSIS — M549 Dorsalgia, unspecified: Secondary | ICD-10-CM

## 2013-10-18 MED ORDER — METOPROLOL TARTRATE 25 MG PO TABS: 25.0000 mg | ORAL_TABLET | Freq: Two times a day (BID) | ORAL | Status: DC

## 2013-10-18 NOTE — Patient Instructions (Signed)
Time to stop drinking and if you need to go to AA then go Get in to see Dr Evelene Croon

## 2013-10-18 NOTE — Progress Notes (Signed)
  Subjective:    Patient ID: Gregory [name], male    DOB: 1951-11-21, 62 y.o.   MRN: [id number]  HPI He is here for consult after recent emergency room and hospital visits on at least 2 occasions. He did have an injury requiring him to go to her rehabilitation for while. He was also evaluated by cardiology because of the slightly elevated troponin which turned out to be negative. He was seen in followup by cardiology. Continues to complain of a popping sensation in his left anterolateral lower rib area. He also has had difficulty with drinking. He does have underlying bipolar disorder. He is followed by Dr.  Evelene Croon and he plans to make an appointment to be seen in followup for his underlying bipolar disorder. He continues on Depakote and Lamictal.  He then mentioned that he has sleep apnea and mentioned frontal lobe seizures. None of this I have any data on.He also states he has had difficulty with low back pain and has been followed at [facility] orthopedics. They apparently mentioned possible surgery and again I have no data on this.   Review of Systems     Objective:   Physical Exam Alert and in no distress. Slightly emaciated with a slight tremor noted. Slight tenderness palpation noted in the left lateral rib area. The medical record was reviewed. Contact was made with Dr. Evelene Croon however she has no information concerning his sleep apnea. Review of her records did indicate he had a seizure but it was from hyponatremia.      Assessment & Plan:  Hypertension - Plan: metoprolol tartrate (LOPRESSOR) 25 MG tablet  Alcohol dependence  Contusion of ribs, left, sequela  Bipolar disorder  Sleep apnea  Chronic back pain We'll attempt to get the sleep study. His frontal lobe issue was probably a hyponatremic seizure. Encouraged him to followup with Dr. Evelene Croon. Also strongly encouraged him to quit drinking entirely. Release form signed for orthopedics to followup on his back pain. Prescription for  metoprolol written as per cardiology notes. I will have my nurse get a CPAP readout. Approximately 40 minutes spent discussing all these issues with him. He did tend to jump from one topic to the other very quickly making it difficult to keep him on task.

## 2013-10-20 ENCOUNTER — Other Ambulatory Visit: Payer: Self-pay | Admitting: Internal Medicine

## 2013-10-20 DIAGNOSIS — R0781 Pleurodynia: Secondary | ICD-10-CM

## 2013-10-20 DIAGNOSIS — R079 Chest pain, unspecified: Secondary | ICD-10-CM

## 2013-10-21 ENCOUNTER — Ambulatory Visit
Admission: RE | Admit: 2013-10-21 | Discharge: 2013-10-21 | Disposition: A | Discharge status: Home / Self Care | Payer: Medicare Other | Source: Ambulatory Visit | Attending: Family Medicine | Admitting: Family Medicine

## 2013-10-21 DIAGNOSIS — R0781 Pleurodynia: Secondary | ICD-10-CM

## 2013-10-21 DIAGNOSIS — R079 Chest pain, unspecified: Secondary | ICD-10-CM

## 2013-10-24 NOTE — Progress Notes (Signed)
Quick Note:  CALLED PT LEFT WORD FOR WORD MESSAGE Let him know that the x-ray did show recent rib fractures. They should slowly heal. ______

## 2013-10-27 ENCOUNTER — Other Ambulatory Visit: Payer: Self-pay

## 2013-11-01 ENCOUNTER — Encounter: Payer: Self-pay | Admitting: Family Medicine

## 2013-11-03 ENCOUNTER — Telehealth: Payer: Self-pay | Admitting: Family Medicine

## 2013-11-03 ENCOUNTER — Other Ambulatory Visit: Payer: Self-pay | Admitting: Internal Medicine

## 2013-11-03 ENCOUNTER — Other Ambulatory Visit: Payer: Self-pay | Admitting: Family Medicine

## 2013-11-03 DIAGNOSIS — I10 Essential (primary) hypertension: Secondary | ICD-10-CM

## 2013-11-03 MED ORDER — METOPROLOL TARTRATE 25 MG PO TABS: 25.0000 mg | ORAL_TABLET | Freq: Two times a day (BID) | ORAL | Status: DC

## 2013-11-03 NOTE — Telephone Encounter (Signed)
Pt is requesting refill for Metoprolol Tart.  25 mg #90 sent to [facility].

## 2013-11-03 NOTE — Telephone Encounter (Signed)
Rx medication was sent to the pharmacy. CLS

## 2013-12-07 ENCOUNTER — Telehealth: Payer: Self-pay | Admitting: Family Medicine

## 2013-12-08 NOTE — Telephone Encounter (Signed)
CALLED PT  TO INFORM HIM WE HAVE NOT RECEIVED HIS RECORDS CALLED [facility] AND THEY SAID WE NEEDED TO FAX A MEDICAL RELEASE FORM TO THEM I CALLED PT BACK AND INFORMED HIM HE SAID HE WOULD SWING BY TOMORROW AND SIGN

## 2013-12-08 NOTE — Telephone Encounter (Signed)
Check this out  °

## 2013-12-09 NOTE — Telephone Encounter (Signed)
Called pt back & advised we already have signed records release, will refax to them

## 2014-01-05 ENCOUNTER — Telehealth: Payer: Self-pay | Admitting: Internal Medicine

## 2014-01-05 MED ORDER — LAMOTRIGINE 200 MG PO TABS: 200.0000 mg | ORAL_TABLET | Freq: Every morning | ORAL | Status: DC

## 2014-01-05 NOTE — Telephone Encounter (Signed)
Pt called needing a refill on lamotrigine 200mg  to [facility]

## 2014-01-10 NOTE — Telephone Encounter (Signed)
Records recv'd & reviewed, they are in pt's paper chart

## 2014-01-13 ENCOUNTER — Telehealth: Payer: Self-pay | Admitting: Family Medicine

## 2014-01-13 DIAGNOSIS — E785 Hyperlipidemia, unspecified: Secondary | ICD-10-CM

## 2014-01-16 ENCOUNTER — Other Ambulatory Visit: Payer: Self-pay | Admitting: *Deleted

## 2014-01-16 MED ORDER — LISINOPRIL-HYDROCHLOROTHIAZIDE 20-12.5 MG PO TABS: 1.0000 | ORAL_TABLET | Freq: Every morning | ORAL | Status: DC

## 2014-01-16 MED ORDER — AMLODIPINE BESYLATE 10 MG PO TABS: 10.0000 mg | ORAL_TABLET | Freq: Every day | ORAL | Status: DC

## 2014-01-16 MED ORDER — ATORVASTATIN CALCIUM 20 MG PO TABS: 20.0000 mg | ORAL_TABLET | Freq: Every day | ORAL | Status: DC

## 2014-01-16 NOTE — Telephone Encounter (Signed)
Done

## 2014-01-19 ENCOUNTER — Emergency Department (HOSPITAL_COMMUNITY): Payer: Medicare Other

## 2014-01-19 ENCOUNTER — Encounter (HOSPITAL_COMMUNITY): Payer: Self-pay | Admitting: Internal Medicine

## 2014-01-19 ENCOUNTER — Inpatient Hospital Stay (HOSPITAL_COMMUNITY)
Admission: EM | Admit: 2014-01-19 | Discharge: 2014-01-21 | DRG: 315 | Disposition: A | Discharge status: Home Health | Payer: Medicare Other | Attending: Internal Medicine | Admitting: Internal Medicine

## 2014-01-19 DIAGNOSIS — F319 Bipolar disorder, unspecified: Secondary | ICD-10-CM | POA: Diagnosis present

## 2014-01-19 DIAGNOSIS — Z833 Family history of diabetes mellitus: Secondary | ICD-10-CM

## 2014-01-19 DIAGNOSIS — I350 Nonrheumatic aortic (valve) stenosis: Secondary | ICD-10-CM | POA: Diagnosis present

## 2014-01-19 DIAGNOSIS — W19XXXA Unspecified fall, initial encounter: Secondary | ICD-10-CM

## 2014-01-19 DIAGNOSIS — Z79899 Other long term (current) drug therapy: Secondary | ICD-10-CM

## 2014-01-19 DIAGNOSIS — G709 Myoneural disorder, unspecified: Secondary | ICD-10-CM | POA: Diagnosis present

## 2014-01-19 DIAGNOSIS — F1020 Alcohol dependence, uncomplicated: Secondary | ICD-10-CM

## 2014-01-19 DIAGNOSIS — I2510 Atherosclerotic heart disease of native coronary artery without angina pectoris: Secondary | ICD-10-CM | POA: Diagnosis present

## 2014-01-19 DIAGNOSIS — Z9119 Patient's noncompliance with other medical treatment and regimen: Secondary | ICD-10-CM

## 2014-01-19 DIAGNOSIS — I359 Nonrheumatic aortic valve disorder, unspecified: Secondary | ICD-10-CM | POA: Diagnosis present

## 2014-01-19 DIAGNOSIS — T45515A Adverse effect of anticoagulants, initial encounter: Secondary | ICD-10-CM | POA: Diagnosis present

## 2014-01-19 DIAGNOSIS — F3110 Bipolar disorder, current episode manic without psychotic features, unspecified: Secondary | ICD-10-CM | POA: Diagnosis present

## 2014-01-19 DIAGNOSIS — J45909 Unspecified asthma, uncomplicated: Secondary | ICD-10-CM

## 2014-01-19 DIAGNOSIS — E785 Hyperlipidemia, unspecified: Secondary | ICD-10-CM | POA: Diagnosis present

## 2014-01-19 DIAGNOSIS — Z823 Family history of stroke: Secondary | ICD-10-CM

## 2014-01-19 DIAGNOSIS — Z91199 Patient's noncompliance with other medical treatment and regimen due to unspecified reason: Secondary | ICD-10-CM

## 2014-01-19 DIAGNOSIS — F17200 Nicotine dependence, unspecified, uncomplicated: Secondary | ICD-10-CM

## 2014-01-19 DIAGNOSIS — J45901 Unspecified asthma with (acute) exacerbation: Secondary | ICD-10-CM

## 2014-01-19 DIAGNOSIS — R569 Unspecified convulsions: Secondary | ICD-10-CM | POA: Diagnosis present

## 2014-01-19 DIAGNOSIS — E860 Dehydration: Secondary | ICD-10-CM | POA: Diagnosis present

## 2014-01-19 DIAGNOSIS — Z8249 Family history of ischemic heart disease and other diseases of the circulatory system: Secondary | ICD-10-CM

## 2014-01-19 DIAGNOSIS — G4733 Obstructive sleep apnea (adult) (pediatric): Secondary | ICD-10-CM | POA: Diagnosis present

## 2014-01-19 DIAGNOSIS — I959 Hypotension, unspecified: Principal | ICD-10-CM | POA: Diagnosis present

## 2014-01-19 DIAGNOSIS — I10 Essential (primary) hypertension: Secondary | ICD-10-CM

## 2014-01-19 DIAGNOSIS — J441 Chronic obstructive pulmonary disease with (acute) exacerbation: Secondary | ICD-10-CM | POA: Diagnosis present

## 2014-01-19 DIAGNOSIS — I6529 Occlusion and stenosis of unspecified carotid artery: Secondary | ICD-10-CM | POA: Diagnosis present

## 2014-01-19 DIAGNOSIS — R55 Syncope and collapse: Secondary | ICD-10-CM | POA: Insufficient documentation

## 2014-01-19 DIAGNOSIS — F10929 Alcohol use, unspecified with intoxication, unspecified: Secondary | ICD-10-CM

## 2014-01-19 DIAGNOSIS — R319 Hematuria, unspecified: Secondary | ICD-10-CM | POA: Diagnosis present

## 2014-01-19 LAB — CBC WITH DIFFERENTIAL/PLATELET
Basophils Absolute: 0 10*3/uL (ref 0.0–0.1)
Basophils Absolute: 0 10*3/uL (ref 0.0–0.1)
Basophils Relative: 0 % (ref 0–1)
Basophils Relative: 0 % (ref 0–1)
Eosinophils Absolute: 0.1 10*3/uL (ref 0.0–0.7)
Eosinophils Absolute: 0 10*3/uL (ref 0.0–0.7)
Eosinophils Relative: 1 % (ref 0–5)
Eosinophils Relative: 0 % (ref 0–5)
HCT: 39 % (ref 39.0–52.0)
HCT: 34.9 % — ABNORMAL LOW (ref 39.0–52.0)
Hemoglobin: 13.6 g/dL (ref 13.0–17.0)
Hemoglobin: 12.1 g/dL — ABNORMAL LOW (ref 13.0–17.0)
Lymphocytes Relative: 49 % — ABNORMAL HIGH (ref 12–46)
Lymphocytes Relative: 27 % (ref 12–46)
Lymphs Abs: 3.7 10*3/uL (ref 0.7–4.0)
Lymphs Abs: 1.7 10*3/uL (ref 0.7–4.0)
MCH: 32.3 pg (ref 26.0–34.0)
MCH: 32.5 pg (ref 26.0–34.0)
MCHC: 34.9 g/dL (ref 30.0–36.0)
MCHC: 34.7 g/dL (ref 30.0–36.0)
MCV: 92.6 fL (ref 78.0–100.0)
MCV: 93.8 fL (ref 78.0–100.0)
Monocytes Absolute: 0.8 10*3/uL (ref 0.1–1.0)
Monocytes Absolute: 0.7 10*3/uL (ref 0.1–1.0)
Monocytes Relative: 11 % (ref 3–12)
Monocytes Relative: 12 % (ref 3–12)
Neutro Abs: 2.9 10*3/uL (ref 1.7–7.7)
Neutro Abs: 3.9 10*3/uL (ref 1.7–7.7)
Neutrophils Relative %: 39 % — ABNORMAL LOW (ref 43–77)
Neutrophils Relative %: 61 % (ref 43–77)
Platelets: 133 10*3/uL — ABNORMAL LOW (ref 150–400)
Platelets: 106 10*3/uL — ABNORMAL LOW (ref 150–400)
RBC: 4.21 MIL/uL — ABNORMAL LOW (ref 4.22–5.81)
RBC: 3.72 MIL/uL — ABNORMAL LOW (ref 4.22–5.81)
RDW: 13.2 % (ref 11.5–15.5)
RDW: 13.5 % (ref 11.5–15.5)
WBC: 7.4 10*3/uL (ref 4.0–10.5)
WBC: 6.3 10*3/uL (ref 4.0–10.5)

## 2014-01-19 LAB — COMPREHENSIVE METABOLIC PANEL
ALT: 79 U/L — ABNORMAL HIGH (ref 0–53)
ALT: 62 U/L — ABNORMAL HIGH (ref 0–53)
AST: 108 U/L — ABNORMAL HIGH (ref 0–37)
AST: 78 U/L — ABNORMAL HIGH (ref 0–37)
Albumin: 3.5 g/dL (ref 3.5–5.2)
Albumin: 3.1 g/dL — ABNORMAL LOW (ref 3.5–5.2)
Alkaline Phosphatase: 91 U/L (ref 39–117)
Alkaline Phosphatase: 77 U/L (ref 39–117)
BUN: 9 mg/dL (ref 6–23)
BUN: 6 mg/dL (ref 6–23)
CO2: 24 mEq/L (ref 19–32)
CO2: 22 mEq/L (ref 19–32)
Calcium: 8.6 mg/dL (ref 8.4–10.5)
Calcium: 7.3 mg/dL — ABNORMAL LOW (ref 8.4–10.5)
Chloride: 93 mEq/L — ABNORMAL LOW (ref 96–112)
Chloride: 97 mEq/L (ref 96–112)
Creatinine, Ser: 0.68 mg/dL (ref 0.50–1.35)
Creatinine, Ser: 0.59 mg/dL (ref 0.50–1.35)
GFR calc Af Amer: >90 mL/min (ref >90)
GFR calc Af Amer: >90 mL/min (ref >90)
GFR calc non Af Amer: >90 mL/min (ref >90)
GFR calc non Af Amer: >90 mL/min (ref >90)
Glucose, Bld: 102 mg/dL — ABNORMAL HIGH (ref 70–99)
Glucose, Bld: 104 mg/dL — ABNORMAL HIGH (ref 70–99)
Potassium: 4 mEq/L (ref 3.7–5.3)
Potassium: 3.8 mEq/L (ref 3.7–5.3)
Sodium: 135 mEq/L — ABNORMAL LOW (ref 137–147)
Sodium: 137 mEq/L (ref 137–147)
Total Bilirubin: 0.3 mg/dL (ref 0.3–1.2)
Total Bilirubin: 0.3 mg/dL (ref 0.3–1.2)
Total Protein: 7.1 g/dL (ref 6.0–8.3)
Total Protein: 6.3 g/dL (ref 6.0–8.3)

## 2014-01-19 LAB — INFLUENZA PANEL BY PCR (TYPE A & B)
H1N1 flu by pcr: NOT DETECTED
Influenza A By PCR: NEGATIVE
Influenza B By PCR: NEGATIVE

## 2014-01-19 LAB — SALICYLATE LEVEL: Salicylate Lvl: <2 mg/dL — ABNORMAL LOW (ref 2.8–20.0)

## 2014-01-19 LAB — URINE MICROSCOPIC-ADD ON

## 2014-01-19 LAB — URINALYSIS, ROUTINE W REFLEX MICROSCOPIC
Bilirubin Urine: NEGATIVE
Glucose, UA: NEGATIVE mg/dL
Ketones, ur: NEGATIVE mg/dL
Leukocytes, UA: NEGATIVE
Nitrite: NEGATIVE
Protein, ur: NEGATIVE mg/dL
Specific Gravity, Urine: 1.01 (ref 1.005–1.030)
Urobilinogen, UA: 0.2 mg/dL (ref 0.0–1.0)
pH: 6.5 (ref 5.0–8.0)

## 2014-01-19 LAB — ETHANOL: Alcohol, Ethyl (B): 391 mg/dL — ABNORMAL HIGH (ref 0–11)

## 2014-01-19 LAB — RAPID URINE DRUG SCREEN, HOSP PERFORMED
Amphetamines: NOT DETECTED
Barbiturates: NOT DETECTED
Benzodiazepines: NOT DETECTED
Cocaine: NOT DETECTED
Opiates: NOT DETECTED
Tetrahydrocannabinol: NOT DETECTED

## 2014-01-19 LAB — VALPROIC ACID LEVEL: Valproic Acid Lvl: <10 ug/mL — ABNORMAL LOW (ref 50.0–100.0)

## 2014-01-19 LAB — LACTIC ACID, PLASMA: Lactic Acid, Venous: 4.4 mmol/L — ABNORMAL HIGH (ref 0.5–2.2)

## 2014-01-19 LAB — ACETAMINOPHEN LEVEL: Acetaminophen (Tylenol), Serum: <15 ug/mL (ref 10–30)

## 2014-01-19 LAB — TROPONIN I
Troponin I: <0.3 ng/mL (ref <0.30)
Troponin I: <0.3 ng/mL (ref <0.30)

## 2014-01-19 LAB — GLUCOSE, CAPILLARY: Glucose-Capillary: 91 mg/dL (ref 70–99)

## 2014-01-19 LAB — AMMONIA: Ammonia: 10 umol/L — ABNORMAL LOW (ref 11–60)

## 2014-01-19 LAB — CG4 I-STAT (LACTIC ACID): Lactic Acid, Venous: 3.7 mmol/L — ABNORMAL HIGH (ref 0.5–2.2)

## 2014-01-19 MED ORDER — LORAZEPAM 2 MG/ML IJ SOLN
0.0000 mg | Freq: Four times a day (QID) | INTRAMUSCULAR | Status: AC
  Administered 2014-01-19 – 2014-01-20 (×6): 1 mg via INTRAVENOUS
  Filled 2014-01-19 (×6): qty 1

## 2014-01-19 MED ORDER — ALBUTEROL SULFATE (2.5 MG/3ML) 0.083% IN NEBU
2.5000 mg | INHALATION_SOLUTION | RESPIRATORY_TRACT | Status: DC | PRN
  Administered 2014-01-19: 2.5 mg via RESPIRATORY_TRACT
  Filled 2014-01-19: qty 3

## 2014-01-19 MED ORDER — IPRATROPIUM-ALBUTEROL 0.5-2.5 (3) MG/3ML IN SOLN
RESPIRATORY_TRACT | Status: AC
  Filled 2014-01-19: qty 3

## 2014-01-19 MED ORDER — FOLIC ACID 1 MG PO TABS
1.0000 mg | ORAL_TABLET | Freq: Once | ORAL | Status: AC
  Administered 2014-01-19: 1 mg via ORAL
  Filled 2014-01-19: qty 1

## 2014-01-19 MED ORDER — BUDESONIDE 0.25 MG/2ML IN SUSP
0.2500 mg | Freq: Two times a day (BID) | RESPIRATORY_TRACT | Status: DC
  Administered 2014-01-19 – 2014-01-21 (×5): 0.25 mg via RESPIRATORY_TRACT
  Filled 2014-01-19 (×9): qty 2

## 2014-01-19 MED ORDER — SODIUM CHLORIDE 0.9 % IJ SOLN: 3.0000 mL | Freq: Two times a day (BID) | INTRAMUSCULAR | Status: DC

## 2014-01-19 MED ORDER — SODIUM CHLORIDE 0.9 % IV SOLN
INTRAVENOUS | Status: DC
  Administered 2014-01-19 – 2014-01-21 (×4): via INTRAVENOUS

## 2014-01-19 MED ORDER — THIAMINE HCL 100 MG/ML IJ SOLN
100.0000 mg | Freq: Every day | INTRAMUSCULAR | Status: DC
  Filled 2014-01-19 (×3): qty 1

## 2014-01-19 MED ORDER — ALBUTEROL SULFATE (2.5 MG/3ML) 0.083% IN NEBU
2.5000 mg | INHALATION_SOLUTION | RESPIRATORY_TRACT | Status: DC
  Administered 2014-01-19 – 2014-01-20 (×5): 2.5 mg via RESPIRATORY_TRACT
  Filled 2014-01-19 (×5): qty 3

## 2014-01-19 MED ORDER — THIAMINE HCL 100 MG/ML IJ SOLN
100.0000 mg | Freq: Once | INTRAMUSCULAR | Status: AC
  Administered 2014-01-19: 100 mg via INTRAVENOUS
  Filled 2014-01-19: qty 2

## 2014-01-19 MED ORDER — LORAZEPAM 1 MG PO TABS
1.0000 mg | ORAL_TABLET | Freq: Four times a day (QID) | ORAL | Status: DC | PRN
  Administered 2014-01-20: 1 mg via ORAL
  Filled 2014-01-19: qty 1

## 2014-01-19 MED ORDER — FOLIC ACID 1 MG PO TABS
1.0000 mg | ORAL_TABLET | Freq: Every day | ORAL | Status: DC
  Administered 2014-01-20 – 2014-01-21 (×2): 1 mg via ORAL
  Filled 2014-01-19 (×3): qty 1

## 2014-01-19 MED ORDER — ACETAMINOPHEN 650 MG RE SUPP: 650.0000 mg | Freq: Four times a day (QID) | RECTAL | Status: DC | PRN

## 2014-01-19 MED ORDER — LAMOTRIGINE 200 MG PO TABS
200.0000 mg | ORAL_TABLET | Freq: Every morning | ORAL | Status: DC
  Administered 2014-01-19 – 2014-01-21 (×3): 200 mg via ORAL
  Filled 2014-01-19 (×3): qty 1

## 2014-01-19 MED ORDER — SODIUM CHLORIDE 0.9 % IV SOLN
1000.0000 mL | Freq: Once | INTRAVENOUS | Status: AC
  Administered 2014-01-19: 1000 mL via INTRAVENOUS

## 2014-01-19 MED ORDER — LORAZEPAM 2 MG/ML IJ SOLN
0.0000 mg | Freq: Two times a day (BID) | INTRAMUSCULAR | Status: DC
  Filled 2014-01-19: qty 1

## 2014-01-19 MED ORDER — ONDANSETRON HCL 4 MG PO TABS: 4.0000 mg | ORAL_TABLET | Freq: Four times a day (QID) | ORAL | Status: DC | PRN

## 2014-01-19 MED ORDER — PANTOPRAZOLE SODIUM 40 MG IV SOLR
40.0000 mg | INTRAVENOUS | Status: DC
  Administered 2014-01-19 – 2014-01-20 (×2): 40 mg via INTRAVENOUS
  Filled 2014-01-19 (×3): qty 40

## 2014-01-19 MED ORDER — ALBUTEROL SULFATE (2.5 MG/3ML) 0.083% IN NEBU
2.5000 mg | INHALATION_SOLUTION | Freq: Once | RESPIRATORY_TRACT | Status: AC
  Administered 2014-01-19: 2.5 mg via RESPIRATORY_TRACT
  Filled 2014-01-19: qty 3

## 2014-01-19 MED ORDER — VITAMIN B-1 100 MG PO TABS
100.0000 mg | ORAL_TABLET | Freq: Every day | ORAL | Status: DC
  Administered 2014-01-20 – 2014-01-21 (×2): 100 mg via ORAL
  Filled 2014-01-19 (×3): qty 1

## 2014-01-19 MED ORDER — ADULT MULTIVITAMIN W/MINERALS CH
1.0000 | ORAL_TABLET | Freq: Every morning | ORAL | Status: DC
  Administered 2014-01-19 – 2014-01-21 (×3): 1 via ORAL
  Filled 2014-01-19 (×3): qty 1

## 2014-01-19 MED ORDER — NICOTINE 21 MG/24HR TD PT24
21.0000 mg | MEDICATED_PATCH | Freq: Every day | TRANSDERMAL | Status: DC
  Administered 2014-01-19 – 2014-01-21 (×3): 21 mg via TRANSDERMAL
  Filled 2014-01-19 (×3): qty 1

## 2014-01-19 MED ORDER — LEVOFLOXACIN IN D5W 500 MG/100ML IV SOLN
500.0000 mg | INTRAVENOUS | Status: DC
  Administered 2014-01-19 – 2014-01-21 (×3): 500 mg via INTRAVENOUS
  Filled 2014-01-19 (×3): qty 100

## 2014-01-19 MED ORDER — IPRATROPIUM-ALBUTEROL 0.5-2.5 (3) MG/3ML IN SOLN
3.0000 mL | Freq: Once | RESPIRATORY_TRACT | Status: AC
  Administered 2014-01-19: 3 mL via RESPIRATORY_TRACT

## 2014-01-19 MED ORDER — SODIUM CHLORIDE 0.9 % IV SOLN
1000.0000 mL | INTRAVENOUS | Status: DC
  Administered 2014-01-19: 1000 mL via INTRAVENOUS

## 2014-01-19 MED ORDER — IPRATROPIUM-ALBUTEROL 0.5-2.5 (3) MG/3ML IN SOLN: 3.0000 mL | RESPIRATORY_TRACT | Status: DC

## 2014-01-19 MED ORDER — METHYLPREDNISOLONE SODIUM SUCC 40 MG IJ SOLR
40.0000 mg | Freq: Every day | INTRAMUSCULAR | Status: DC
  Administered 2014-01-19 – 2014-01-20 (×2): 40 mg via INTRAVENOUS
  Filled 2014-01-19 (×3): qty 1

## 2014-01-19 MED ORDER — SODIUM CHLORIDE 0.9 % IV SOLN
1000.0000 mL | Freq: Once | INTRAVENOUS | Status: AC
Start: 2014-01-19 — End: 2014-01-19
  Administered 2014-01-19: 1000 mL via INTRAVENOUS

## 2014-01-19 MED ORDER — ATORVASTATIN CALCIUM 20 MG PO TABS
20.0000 mg | ORAL_TABLET | Freq: Every day | ORAL | Status: DC
  Administered 2014-01-19 – 2014-01-21 (×3): 20 mg via ORAL
  Filled 2014-01-19 (×3): qty 1

## 2014-01-19 MED ORDER — DIVALPROEX SODIUM 500 MG PO DR TAB
2000.0000 mg | DELAYED_RELEASE_TABLET | Freq: Every day | ORAL | Status: DC
  Administered 2014-01-19 – 2014-01-20 (×2): 2000 mg via ORAL
  Filled 2014-01-19 (×3): qty 4

## 2014-01-19 MED ORDER — ASPIRIN EC 81 MG PO TBEC
81.0000 mg | DELAYED_RELEASE_TABLET | Freq: Every day | ORAL | Status: DC
  Administered 2014-01-19 – 2014-01-21 (×3): 81 mg via ORAL
  Filled 2014-01-19 (×3): qty 1

## 2014-01-19 MED ORDER — ONDANSETRON HCL 4 MG/2ML IJ SOLN: 4.0000 mg | Freq: Four times a day (QID) | INTRAMUSCULAR | Status: DC | PRN

## 2014-01-19 MED ORDER — ACETAMINOPHEN 325 MG PO TABS: 650.0000 mg | ORAL_TABLET | Freq: Four times a day (QID) | ORAL | Status: DC | PRN

## 2014-01-19 MED ORDER — ENOXAPARIN SODIUM 40 MG/0.4ML ~~LOC~~ SOLN
40.0000 mg | SUBCUTANEOUS | Status: DC
  Administered 2014-01-19: 40 mg via SUBCUTANEOUS
  Filled 2014-01-19: qty 0.4

## 2014-01-19 MED ORDER — ADULT MULTIVITAMIN W/MINERALS CH: 1.0000 | ORAL_TABLET | Freq: Every day | ORAL | Status: DC

## 2014-01-19 MED ORDER — FLUTICASONE PROPIONATE 50 MCG/ACT NA SUSP
2.0000 | Freq: Every day | NASAL | Status: DC
  Administered 2014-01-19 – 2014-01-20 (×2): 2 via NASAL
  Filled 2014-01-19: qty 16

## 2014-01-19 MED ORDER — THIAMINE HCL 100 MG/ML IJ SOLN
Freq: Once | INTRAVENOUS | Status: AC
  Administered 2014-01-19: 20:00:00 via INTRAVENOUS
  Filled 2014-01-19: qty 1000

## 2014-01-19 MED ORDER — LORAZEPAM 2 MG/ML IJ SOLN
1.0000 mg | Freq: Four times a day (QID) | INTRAMUSCULAR | Status: DC | PRN
  Administered 2014-01-20 – 2014-01-21 (×3): 1 mg via INTRAVENOUS

## 2014-01-19 NOTE — ED Notes (Signed)
Bed: WA10 Expected date:  Expected time:  Means of arrival:  Comments: 

## 2014-01-19 NOTE — ED Notes (Signed)
Attempted to call report to 4E, RN states they may have to change bed assignment, will return call for report

## 2014-01-19 NOTE — ED Provider Notes (Signed)
CSN: [id number]     Arrival date & time 01/19/14  0010 History   First MD Initiated Contact with Patient 01/19/14 0015     Chief Complaint  Patient presents with  . Alcohol Problem   (Consider location/radiation/quality/duration/timing/severity/associated sxs/prior Treatment) HPI 63 year old male presents to emergency department from home via EMS after syncopal episode.  Wife gives most of the history.  She reports she feels that her husband has a drinking problem.  She reports that he is noncompliant with taking his medications for hypertension, and bipolar disease.  She reports that she must travel, often for work, and he has a hard time coping with it.  Patient had fall about 6-8 weeks ago with rib fractures, pneumonia.  During that hospitalization, it was noted that he had alcohol withdrawal.  Prior records reviewed, admission in September for these issues.  Patient reports he drinks a bottle of wine a day.  He is interested in rehabilitation for his alcoholism.  Tonight they were having an argument.  Wife is leaving for a business trip and patient did not want to take her to the airport tomorrow as he felt weak.  She noted that he was more lethargic than usual.  She planned to take him to the emergency department, but he had syncopal event in the front yard.  Patient denies any chest pain, shortness of breath, abdominal pain, headache.  Wife denies any injury with the syncopal episode.  Initial blood pressure upon arrival in the emergency department was 72/42.  Patient has not taken his hypertensive meds in the last several days.  He denies any coingestions.  He denies SI or HI.  He reports depression. Past Medical History  Diagnosis Date  . Allergy   . Hypertension   . Bipolar disorder   . Alcohol abuse   . Tobacco use disorder   . Sleep apnea   . Asthma   . Aortic stenosis   . Neuromuscular disorder   . Family history of skin cancer   . Hyperlipidemia   . RBBB     a. 7.2012 low risk  nuclear exam, sm area of apical ischemia.  . Carotid artery occlusion   . Claudication   . Smoker   . Chronic back pain   . CAD (coronary artery disease)    Past Surgical History  Procedure Laterality Date  . Colonoscopy  2009  . Vasectomy reversal    . US echocardiography  06/20/2010    EF 55-60%  . Anal fistulectomy  09/25/11  . Vasectomy    . Elbow surgery      bilaterally for cubital tunnel   Family History  Problem Relation Age of Onset  . Stroke Father 87  . Cancer Father     skin  . Hypertension Brother   . Diabetes Brother   . Cancer Maternal Aunt     skin  . Cancer Maternal Grandmother     liver   History  Substance Use Topics  . Smoking status: Current Every Day Smoker -- 1.00 packs/day for 40 years    Types: Cigarettes  . Smokeless tobacco: Never Used  . Alcohol Use: 14.0 oz/week    28 drink(s) per week     Comment: 2 glasses of wine per night.    Review of Systems  See History of Present Illness; otherwise all other systems are reviewed and negative Allergies  Codeine; Amoxicillin; and Augmentin  Home Medications   Current Outpatient Rx  Name  Route  Sig  Dispense  Refill  . albuterol (PROVENTIL HFA;VENTOLIN HFA) 108 (90 BASE) MCG/ACT inhaler   Inhalation   Inhale 2 puffs into the lungs every 6 (six) hours as needed for wheezing.         [name] amLODipine (NORVASC) 10 MG tablet   Oral   Take 1 tablet (10 mg total) by mouth daily.   90 tablet   1   . aspirin EC 81 MG tablet   Oral   Take 1 tablet (81 mg total) by mouth daily.   30 tablet   0   . atorvastatin (LIPITOR) 20 MG tablet   Oral   Take 1 tablet (20 mg total) by mouth daily.   90 tablet   1   . divalproex (DEPAKOTE) 500 MG DR tablet   Oral   Take 4 tablets (2,000 mg total) by mouth at bedtime.   480 tablet   3   . fluticasone (FLONASE) 50 MCG/ACT nasal spray   Nasal   Place 2 sprays into the nose daily.   48 g   3   . Fluticasone-Salmeterol (ADVAIR DISKUS) 500-50  MCG/DOSE AEPB   Inhalation   Inhale 1 puff into the lungs every 12 (twelve) hours.   180 each   3   . lamoTRIgine (LAMICTAL) 200 MG tablet   Oral   Take 1 tablet (200 mg total) by mouth every morning.   90 tablet   3   . lisinopril-hydrochlorothiazide (PRINZIDE,ZESTORETIC) 20-12.5 MG per tablet   Oral   Take 1 tablet by mouth every morning.   90 tablet   1   . metoprolol tartrate (LOPRESSOR) 25 MG tablet   Oral   Take 1 tablet (25 mg total) by mouth 2 (two) times daily.   180 tablet   3   . Multiple Vitamin (MULTIVITAMIN WITH MINERALS) TABS tablet   Oral   Take 1 tablet by mouth every morning.           BP 72/42  Temp(Src) 98.3 F (36.8 C) (Oral)  Resp 13  Ht 5\' 5"  (1.651 m)  Wt 134 lb (60.782 kg)  BMI 22.30 kg/m2  SpO2 94% Physical Exam  Nursing note and vitals reviewed. Constitutional: He is oriented to person, place, and time. He appears well-developed and well-nourished. No distress.  Chronically ill appearing male  HENT:  Head: Normocephalic and atraumatic.  Right Ear: External ear normal.  Left Ear: External ear normal.  Nose: Nose normal.  Mouth/Throat: Oropharynx is clear and moist.  Eyes: Conjunctivae and EOM are normal. Pupils are equal, round, and reactive to light.  Neck: Normal range of motion. Neck supple. No JVD present. No tracheal deviation present. No thyromegaly present.  Cardiovascular: Normal rate, regular rhythm, normal heart sounds and intact distal pulses.  Exam reveals no gallop and no friction rub.   No murmur heard. Pulmonary/Chest: Effort normal. No stridor. No respiratory distress. He has wheezes. He has no rales. He exhibits no tenderness.  Abdominal: Soft. Bowel sounds are normal. He exhibits no distension and no mass. There is no tenderness. There is no rebound and no guarding.  Musculoskeletal: Normal range of motion. He exhibits no edema and no tenderness.  Lymphadenopathy:    He has no cervical adenopathy.  Neurological: He  is alert and oriented to person, place, and time. He has normal reflexes. No cranial nerve deficit. He exhibits normal muscle tone. Coordination normal.  Skin: Skin is warm and dry. No rash noted. No erythema. No pallor.  Psychiatric: He has a normal mood and affect. His behavior is normal. Judgment and thought content normal.    ED Course  Procedures (including critical care time) Labs Review Labs Reviewed  ETHANOL - Abnormal; Notable for the following:    Alcohol, Ethyl (B) 391 (*)    All other components within normal limits  COMPREHENSIVE METABOLIC PANEL - Abnormal; Notable for the following:    Sodium 135 (*)    Chloride 93 (*)    Glucose, Bld 102 (*)    AST 108 (*)    ALT 79 (*)    All other components within normal limits  CBC WITH DIFFERENTIAL - Abnormal; Notable for the following:    RBC 4.21 (*)    Platelets 133 (*)    Neutrophils Relative % 39 (*)    Lymphocytes Relative 49 (*)    All other components within normal limits  SALICYLATE LEVEL - Abnormal; Notable for the following:    Salicylate Lvl <4.5 (*)    All other components within normal limits  URINALYSIS, ROUTINE W REFLEX MICROSCOPIC - Abnormal; Notable for the following:    APPearance CLOUDY (*)    Hgb urine dipstick MODERATE (*)    All other components within normal limits  CG4 I-STAT (LACTIC ACID) - Abnormal; Notable for the following:    Lactic Acid, Venous 3.70 (*)    All other components within normal limits  TROPONIN I  ACETAMINOPHEN LEVEL  URINE RAPID DRUG SCREEN (HOSP PERFORMED)  GLUCOSE, CAPILLARY  URINE MICROSCOPIC-ADD ON   Imaging Review Dg Chest Port 1 View  01/19/2014   CLINICAL DATA:  Syncope, hypotension  EXAM: PORTABLE CHEST - 1 VIEW  COMPARISON:  10/21/2013  FINDINGS: The heart size and mediastinal contours are within normal limits. Both lungs are clear. The visualized skeletal structures are unremarkable. Aortic atherosclerosis noted.  IMPRESSION: No active disease.   Electronically  Signed   By: Daryll Brod M.D.   On: 01/19/2014 01:23    EKG Interpretation    Date/Time:  Thursday January 19 2014 00:13:11 EST Ventricular Rate:  58 PR Interval:  152 QRS Duration: 151 QT Interval:  412 QTC Calculation: 405 R Axis:   -69 Text Interpretation:  Sinus rhythm RBBB and LAFB No significant change since last tracing Confirmed by Javad Salva  MD, Shunta Mclaurin ([id number]) on 01/19/2014 1:46:10 AM            MDM   1. Syncope   2. Hypotension   3. Alcoholism   4. Alcohol intoxication   5. COPD exacerbation    63 year old male with syncopal episode, hypotension.  Unclear etiology, may be due to to hypovolemia from malnutrition and chronic alcoholism.  Plan for IV fluids, labs.  Expect he will need admission for workup for syncope, and probable eventual alcohol withdrawal syndrome.    Kalman Drape, MD 01/19/14 [phone]

## 2014-01-19 NOTE — Progress Notes (Signed)
[facility] PROGRESS NOTE  Gregory [name] DGU:[id number] DOB: July 16, 1951 DOA: 01/19/2014 PCP: Wyatt Haste, MD  Assessment/Plan:  ETOH intoxication -Continue patient on CIWA protocol -Continue hydration normal saline 125 ml/hr -PT/OT to evaluate patient's outpatient requirements  Seizures -Have consulted neuro hospitalist for help with EEG, medication management. -Continue medication at current level a weight recommendation -Again counseled patient concerning his drinking, and mixing of bipolar/seizure medication and the potential outcomes to include death    Bipolar -Spoke with Dr.Kaur (psychiatry) [phone] patient's and her psychiatrist, and was informed that when her patient's are hospitalized she uses or behavioral health. Have contacted behavioral health for consult. -Will continue patient on current medication regimen awaiting recommendations -Wife and patient were counseled on the fact patient's medication levels were not therapeutic. -Admission the upper acid level <10 ug/ml, Current Valproic acid level = 63.9 (therapeutic) -Lamictal level pending -Continue current medication await recommendations; addendum Dr.Syed Arfeen (psychiatry) saw patient today recommend patient be seen in the chemical dependency intensive outpatient program([facility] IOP) -Per Dr.Syed Arfeen (psychiatry) note the patient does not want to change his Lamictal and Depakote at this time.   Noncompliance -Continue Lamictal and Depakote at current levels until psychiatry and neuro hospitalist evaluate  Asthma exacerbation -Continue levofloxacin -Continue Solu-Medrol -Continue nebulizer treatment -O2 PRN to maintain SpO2> 90%   Tobacco dependence -Nicotine patch placed  OSA -Continue CPAP QHS  HTN -Amlodipine 5 mg daily   Code Status: Full Family Communication: Wife present for discussion of plan of care Disposition Plan: Resolution of intoxication, stabilization of  seizure/bipolar disorder   Consultants: Dr.Syed Arfeen (psychiatry) Neuro hospitalists pending  Procedures:    Antibiotics: Levofloxacin 1/29   HPI/Subjective: Gregory [name] 63 y.o WM PMHx hypertension, moderate aortic stenosis, bipolar disorder, history of seizures which was diagnosed 2007 (has not seen neurologist since then), Hyperlipidemia, tobacco abuse, OSA (not using CPAP as prescribed). Was brought to the ER patient was found to be increasingly weak by patient's wife. Patient wife was concerned because scheduled to leave town. Patient has not been eating well for last few days but did not have any nausea vomiting or abdominal pain diarrhea. Patient positive productive cough/SOB for last 3 weeks. Denies any chest pain. In route to the ER patient fell in the ER but did not lose consciousness. Patient's wife felt it was difficult to raise him up. EMS was called and patient was brought to the ER. Patient was found to be hypotensive with elevated lactic acid levels. Patient at this time has received 3 L normal saline bolus and displacement pressure is stable and not tachycardic. In addition patient was found to be wheezing. Chest x-ray does not show anything acute. Patient did not have any seizure-like activities or any tongue bite or incontinence of urine. Patient will be admitted for further workup. Alcohol levels are elevated. Patient has had a cardiac stress test in October which was showing intermediate risk as per patient's cardiologist has advised continued medical therapy. ADDENDUM 1/29 patient admitted 0600 0n 1/29. followup labs show valproic acid at subtherapeutic level, influenza panel negative, UDS negative, ethanol level 391mg /DL, elevated liver enzymes. 1/30 patient continues to smoke, has been smoking 1-1/2 PPD x38 years. 1/30 patient requested no when he can to discharge. States breathing much better with the use of CPAP, breathing treatments, and steroid treatment. Negative  CP, SOB. Positive continued tremors. Also complains of new onset hematuria secondary to the Lovenox.    Objective: Filed Vitals:   01/19/14 3295 01/19/14 1884 01/19/14 1215  01/19/14 1537  BP:      Pulse:      Temp:      TempSrc:      Resp:      Height:      Weight:      SpO2: 98% 98% 98% 95%    Intake/Output Summary (Last 24 hours) at 01/19/14 1850 Last data filed at 01/19/14 0636  Gross per 24 hour  Intake      0 ml  Output    550 ml  Net   -550 ml   Filed Weights   01/19/14 0015 01/19/14 0750  Weight: 60.782 kg (134 lb) 65.545 kg (144 lb 8 oz)    Exam:   General:  A./O. x4, NAD, asterixis  Cardiovascular: Regular rhythm and rate, negative murmurs rubs gallops, DP/PT pulse +1  Respiratory: Clear to auscultation bilateral  Abdomen: Soft, nontender, plus bowel sounds  Musculoskeletal: Negative pedal edema, cyanosis   Data Reviewed: Basic Metabolic Panel:  Recent Labs Lab 01/19/14 0040 01/19/14 0900  NA 135* 137  K 4.0 3.8  CL 93* 97  CO2 24 22  GLUCOSE 102* 104*  BUN 9 6  CREATININE 0.68 0.59  CALCIUM 8.6 7.3*   Liver Function Tests:  Recent Labs Lab 01/19/14 0040 01/19/14 0900  AST 108* 78*  ALT 79* 62*  ALKPHOS 91 77  BILITOT 0.3 0.3  PROT 7.1 6.3  ALBUMIN 3.5 3.1*   No results found for this basename: LIPASE, AMYLASE,  in the last 168 hours  Recent Labs Lab 01/19/14 0900  AMMONIA 10*   CBC:  Recent Labs Lab 01/19/14 0040 01/19/14 0900  WBC 7.4 6.3  NEUTROABS 2.9 3.9  HGB 13.6 12.1*  HCT 39.0 34.9*  MCV 92.6 93.8  PLT 133* 106*   Cardiac Enzymes:  Recent Labs Lab 01/19/14 0040 01/19/14 0900  TROPONINI <0.30 <0.30   BNP (last 3 results)  Recent Labs  08/29/13 0100  PROBNP 182.2*   CBG:  Recent Labs Lab 01/19/14 0046  GLUCAP 91    No results found for this or any previous visit (from the past 240 hour(s)).   Studies: Dg Chest Port 1 View  01/19/2014   CLINICAL DATA:  Syncope, hypotension  EXAM:  PORTABLE CHEST - 1 VIEW  COMPARISON:  10/21/2013  FINDINGS: The heart size and mediastinal contours are within normal limits. Both lungs are clear. The visualized skeletal structures are unremarkable. Aortic atherosclerosis noted.  IMPRESSION: No active disease.   Electronically Signed   By: Daryll Brod M.D.   On: 01/19/2014 01:23    Scheduled Meds: . albuterol  2.5 mg Nebulization Q4H  . aspirin EC  81 mg Oral Daily  . atorvastatin  20 mg Oral Daily  . budesonide (PULMICORT) nebulizer solution  0.25 mg Nebulization BID  . divalproex  2,000 mg Oral QHS  . enoxaparin (LOVENOX) injection  40 mg Subcutaneous Q24H  . fluticasone  2 spray Each Nare Daily  . folic acid  1 mg Oral Daily  . lamoTRIgine  200 mg Oral q morning - 10a  . levofloxacin (LEVAQUIN) IV  500 mg Intravenous Q24H  . LORazepam  0-4 mg Intravenous Q6H   Followed by  . [START ON 01/21/2014] LORazepam  0-4 mg Intravenous Q12H  . methylPREDNISolone (SOLU-MEDROL) injection  40 mg Intravenous Daily  . multivitamin with minerals  1 tablet Oral q morning - 10a  . pantoprazole (PROTONIX) IV  40 mg Intravenous Q24H  . sodium chloride  3 mL Intravenous  Q12H  . thiamine  100 mg Oral Daily   Or  . thiamine  100 mg Intravenous Daily   Continuous Infusions: . sodium chloride 20 mL/hr at 01/19/14 1142    Principal Problem:   Hypotension Active Problems:   Bipolar disorder   Alcohol dependence   Tobacco use disorder   Aortic stenosis   Weakness   Asthma exacerbation    Time spent: 40 minutes     Gregory [name], Gregory [name]  [facility] Pager [phone]. If 7PM-7AM, please contact night-coverage at [email or web address], password [facility] 01/19/2014, 6:50 PM  LOS: 0 days

## 2014-01-19 NOTE — ED Notes (Signed)
Sharol Given, EDP made aware of patient CG4-Lactic results.

## 2014-01-19 NOTE — H&P (Signed)
[facility] History and Physical  Gregory [name] WNI:[id number] DOB: [birth year] DOA: 01/19/2014  Referring physician: ER physician. PCP: Wyatt Haste, MD   Chief Complaint: Weakness.  HPI: Gregory [name] is a 63 y.o. male with history of hypertension, moderate aortic stenosis, bipolar disorder, history of seizures from mom hyperlipidemia, tobacco abuse was brought to the ER patient was found to be increasingly weak by patient's wife. Patient wife was concerned that she was about to leave town. Patient wife patient has not been eating well for last few days but did not have any nausea vomiting or abdominal pain diarrhea. Patient has been having productive cough with shortness of breath for last 3 weeks. Denies any chest pain. En route to the ER patient fell on the ER but did not lose consciousness. Patient's wife felt it difficult to raise him up. EMS was called and patient was brought to the ER. Patient was found to be hypotensive with elevated lactic acid levels. Patient at this time has received 3 L normal saline bolus and displacement pressure is stable and not tachycardic. In addition patient was found to be wheezing. Chest x-ray does not show anything acute. Patient did not have any seizure-like activities or any tongue bite or incontinence of urine. Patient will be admitted for further workup. Alcohol levels are elevated. Patient has had a cardiac stress test in October which was showing intermediate risk as per patient's cardiologist has advised continued medical therapy.  Review of Systems: As presented in the history of presenting illness, rest negative.  Past Medical History  Diagnosis Date  . Allergy   . Hypertension   . Bipolar disorder   . Alcohol abuse   . Tobacco use disorder   . Sleep apnea   . Asthma   . Aortic stenosis   . Neuromuscular disorder   . Family history of skin cancer   . Hyperlipidemia   . RBBB     a. 7.2012 low risk nuclear exam, sm area of  apical ischemia.  . Carotid artery occlusion   . Claudication   . Smoker   . Chronic back pain   . CAD (coronary artery disease)    Past Surgical History  Procedure Laterality Date  . Colonoscopy  2009  . Vasectomy reversal    . US echocardiography  06/20/2010    EF 55-60%  . Anal fistulectomy  09/25/11  . Vasectomy    . Elbow surgery      bilaterally for cubital tunnel   Social History:  reports that he has been smoking Cigarettes.  He has a 40 pack-year smoking history. He has never used smokeless tobacco. He reports that he drinks about 14.0 ounces of alcohol per week. He reports that he does not use illicit drugs. Where does patient live home. Can patient participate in ADLs? Yes.  Allergies  Allergen Reactions  . Codeine Anaphylaxis  . Amoxicillin Diarrhea  . Augmentin [Amoxicillin-Pot Clavulanate] Diarrhea    Family History:  Family History  Problem Relation Age of Onset  . Stroke Father 36  . Cancer Father     skin  . Hypertension Brother   . Diabetes Brother   . Cancer Maternal Aunt     skin  . Cancer Maternal Grandmother     liver      Prior to Admission medications   Medication Sig Start Date End Date Taking? Authorizing Provider  albuterol (PROVENTIL HFA;VENTOLIN HFA) 108 (90 BASE) MCG/ACT inhaler Inhale 2 puffs into the lungs every 6 (six) hours  as needed for wheezing.   Yes Historical Provider, MD  amLODipine (NORVASC) 5 MG tablet Take 5 mg by mouth daily.   Yes Historical Provider, MD  aspirin EC 81 MG tablet Take 1 tablet (81 mg total) by mouth daily. 09/01/13  Yes Oswald Hillock, MD  atorvastatin (LIPITOR) 20 MG tablet Take 1 tablet (20 mg total) by mouth daily. 01/16/14  Yes [name], MD  divalproex (DEPAKOTE) 500 MG DR tablet Take 4 tablets (2,000 mg total) by mouth at bedtime. 11/29/12  Yes [name], MD  fluticasone [facility]) 50 MCG/ACT nasal spray Place 2 sprays into the nose daily. 11/29/12  Yes [name], MD  Fluticasone-Salmeterol  (ADVAIR DISKUS) 500-50 MCG/DOSE AEPB Inhale 1 puff into the lungs every 12 (twelve) hours. 11/29/12  Yes [name], MD  lamoTRIgine (LAMICTAL) 200 MG tablet Take 1 tablet (200 mg total) by mouth every morning. 01/05/14  Yes [name], MD  lisinopril-hydrochlorothiazide (PRINZIDE,ZESTORETIC) 20-12.5 MG per tablet Take 1 tablet by mouth every morning. 01/16/14  Yes [name], MD  metoprolol tartrate (LOPRESSOR) 25 MG tablet Take 1 tablet (25 mg total) by mouth 2 (two) times daily. 11/03/13  Yes [name], MD  Multiple Vitamin (MULTIVITAMIN WITH MINERALS) TABS tablet Take 1 tablet by mouth every morning.    Yes Historical Provider, MD    Physical Exam: Filed Vitals:   01/19/14 0300 01/19/14 0320 01/19/14 0323 01/19/14 0430  BP: 103/58 97/60  107/56  Pulse: 74 83 65 71  Temp:      TempSrc:      Resp: 14 21 15 21   Height:      Weight:      SpO2: 85% 85% 96% 95%     General:  Well-developed well-nourished.  Eyes: Anicteric no pallor.  ENT: No discharge from ears eyes nose mouth.  Neck: No mass felt.  Cardiovascular: S1-S2 heard.  Respiratory: Bilateral expiratory wheeze heard no crepitations.  Abdomen: Soft nontender bowel sounds present.  Skin: No rash.  Musculoskeletal: No edema.  Psychiatric: Appears normal.  Neurologic: Alert awake oriented to time place and person. Moves all extremities.  Labs on Admission:  Basic Metabolic Panel:  Recent Labs Lab 01/19/14 0040  NA 135*  K 4.0  CL 93*  CO2 24  GLUCOSE 102*  BUN 9  CREATININE 0.68  CALCIUM 8.6   Liver Function Tests:  Recent Labs Lab 01/19/14 0040  AST 108*  ALT 79*  ALKPHOS 91  BILITOT 0.3  PROT 7.1  ALBUMIN 3.5   No results found for this basename: LIPASE, AMYLASE,  in the last 168 hours No results found for this basename: AMMONIA,  in the last 168 hours CBC:  Recent Labs Lab 01/19/14 0040  WBC 7.4  NEUTROABS 2.9  HGB 13.6  HCT 39.0  MCV 92.6  PLT 133*   Cardiac  Enzymes:  Recent Labs Lab 01/19/14 0040  TROPONINI <0.30    BNP (last 3 results)  Recent Labs  08/29/13 0100  PROBNP 182.2*   CBG:  Recent Labs Lab 01/19/14 0046  GLUCAP 91    Radiological Exams on Admission: Dg Chest Port 1 View  01/19/2014   CLINICAL DATA:  Syncope, hypotension  EXAM: PORTABLE CHEST - 1 VIEW  COMPARISON:  10/21/2013  FINDINGS: The heart size and mediastinal contours are within normal limits. Both lungs are clear. The visualized skeletal structures are unremarkable. Aortic atherosclerosis noted.  IMPRESSION: No active disease.   Electronically Signed  By: Daryll Brod M.D.   On: 01/19/2014 01:23    EKG: Independently reviewed. Normal sinus rhythm with RBBB.  Assessment/Plan Principal Problem:   Hypotension Active Problems:   Bipolar disorder   Alcohol dependence   Tobacco use disorder   Aortic stenosis   Weakness   Asthma exacerbation   1. Hypotension - probably contributing to his weakness. Hypotension probably from poor oral intake and dehydration. Patient at this time has normal pressure after 3 L of normal saline bolus. We'll hold off his antihypertensives for now. 2-D echo done in September 24 he was showing EF of 55-60% with moderate aortic stenosis. Patient also had a following stress test which was showing intermediate risk for ischemia with normal LV function and no wall motion abnormality and as per patient cardiologist had recommended medical therapy. At this time I'm when to repeat his labs including lactic acid levels. Check ammonia levels and Depakote levels and troponin. Patient has refused CT head. 2. Asthma exacerbation - patient has been placed on nebulizer Pulmicort and IV steroids. Since patient has productive cough I have placed patient on Levaquin. Check influenza PCR. 3. Tobacco and alcohol abuse -patient has been placed on alcohol withdrawal protocol with IV Ativan. Thiamine. Social worker consult. 4. History of bipolar disorder  - continue home medications. Patient's wife states he was not taking his medications. Check Depakote levels and ammonia levels. 5. Hypertension - hold antihypertensives due to hypotension. 6. Hyperlipidemia - continue statins. Check LFTs.  7. History of CAD - continue aspirin. Check troponins. See #1.  I have reviewed patient's old charts and labs.  Code Status: Full code.  Family Communication: Patient's wife at the bedside.  Disposition Plan: Admit to inpatient.    Arlayne Liggins N. [facility] Pager [phone].  If 7PM-7AM, please contact night-coverage [email or web address] Password [facility] 01/19/2014, 5:44 AM

## 2014-01-19 NOTE — ED Notes (Signed)
Pt sts he is aware that urine is needed but will not be giving it right now.

## 2014-01-19 NOTE — ED Notes (Signed)
Upon entry to room pt sat 79% with good pleth. Pt repositioned ad placed on 02 [place] 3L. Pt slowly up to 92%. Wife at bedside.

## 2014-01-19 NOTE — ED Notes (Signed)
Wife called stating pt had a 'problem' with alcohol and that he has been noncompliant with seizure meds. Pt admits problem and that he drinks daily. Hx of seizure and bipolar.

## 2014-01-19 NOTE — Progress Notes (Signed)
ANTIBIOTIC CONSULT NOTE - INITIAL  Pharmacy Consult for levaquin Indication: asthmatic bronchitis  Allergies  Allergen Reactions  . Codeine Anaphylaxis  . Amoxicillin Diarrhea  . Augmentin [Amoxicillin-Pot Clavulanate] Diarrhea    Patient Measurements: Height: 5\' 5"  (165.1 cm) Weight: 144 lb 8 oz (65.545 kg) IBW/kg (Calculated) : 61.5 Adjusted Body Weight:   Vital Signs: Temp: 98.9 F (37.2 C) (01/29 0750) Temp src: Oral (01/29 0750) BP: 105/57 mmHg (01/29 0750) Pulse Rate: 75 (01/29 0750) Intake/Output from previous day: 01/28 0701 - 01/29 0700 In: -  Out: 550 [Urine:550] Intake/Output from this shift:    Labs:  Recent Labs  01/19/14 0040  WBC 7.4  HGB 13.6  PLT 133*  CREATININE 0.68   Estimated Creatinine Clearance: 82.2 ml/min (by C-G formula based on Cr of 0.68). No results found for this basename: VANCOTROUGH, VANCOPEAK, VANCORANDOM, GENTTROUGH, GENTPEAK, GENTRANDOM, TOBRATROUGH, TOBRAPEAK, TOBRARND, AMIKACINPEAK, AMIKACINTROU, AMIKACIN,  in the last 72 hours   Microbiology: No results found for this or any previous visit (from the past 720 hour(s)).  Medical History: Past Medical History  Diagnosis Date  . Allergy   . Hypertension   . Bipolar disorder   . Alcohol abuse   . Tobacco use disorder   . Sleep apnea   . Asthma   . Aortic stenosis   . Neuromuscular disorder   . Family history of skin cancer   . Hyperlipidemia   . RBBB     a. 7.2012 low risk nuclear exam, sm area of apical ischemia.  . Carotid artery occlusion   . Claudication   . Smoker   . Chronic back pain   . CAD (coronary artery disease)     Assessment: Gregory [name] with PMHx HTN, AS, BPD, tobacco abuse, asthma and hx seizures brought to ED with weakness, also found to be wheezing and hypotensive.  Pharmacy consulted to dose levaquin for asthmatic bronchitis.    Allergies: Amoxicillin/augmentin (diarrhea)  1/29 >> levaquin >>  Tm24h: 98.9 WBC: 7.4 Renal: SCr .0.68, CrCl  82  Goal of Therapy:  Eradication of infection  Plan:  Levaquin 500mg  IV q 24 h.  F/u renal function, clinical course  Ralene Bathe, PharmD, BCPS 01/19/2014, 8:41 AM  Pager: [phone]

## 2014-01-20 DIAGNOSIS — F1020 Alcohol dependence, uncomplicated: Secondary | ICD-10-CM | POA: Diagnosis not present

## 2014-01-20 DIAGNOSIS — W19XXXA Unspecified fall, initial encounter: Secondary | ICD-10-CM

## 2014-01-20 DIAGNOSIS — R55 Syncope and collapse: Secondary | ICD-10-CM

## 2014-01-20 DIAGNOSIS — I10 Essential (primary) hypertension: Secondary | ICD-10-CM

## 2014-01-20 DIAGNOSIS — G4733 Obstructive sleep apnea (adult) (pediatric): Secondary | ICD-10-CM

## 2014-01-20 DIAGNOSIS — F1010 Alcohol abuse, uncomplicated: Secondary | ICD-10-CM

## 2014-01-20 DIAGNOSIS — F319 Bipolar disorder, unspecified: Secondary | ICD-10-CM | POA: Diagnosis not present

## 2014-01-20 LAB — CBC WITH DIFFERENTIAL/PLATELET
Basophils Absolute: 0 10*3/uL (ref 0.0–0.1)
Basophils Relative: 0 % (ref 0–1)
Eosinophils Absolute: 0 10*3/uL (ref 0.0–0.7)
Eosinophils Relative: 0 % (ref 0–5)
HCT: 34.5 % — ABNORMAL LOW (ref 39.0–52.0)
Hemoglobin: 11.6 g/dL — ABNORMAL LOW (ref 13.0–17.0)
Lymphocytes Relative: 21 % (ref 12–46)
Lymphs Abs: 0.9 10*3/uL (ref 0.7–4.0)
MCH: 31.9 pg (ref 26.0–34.0)
MCHC: 33.6 g/dL (ref 30.0–36.0)
MCV: 94.8 fL (ref 78.0–100.0)
Monocytes Absolute: 0.5 10*3/uL (ref 0.1–1.0)
Monocytes Relative: 11 % (ref 3–12)
Neutro Abs: 2.9 10*3/uL (ref 1.7–7.7)
Neutrophils Relative %: 68 % (ref 43–77)
Platelets: 102 10*3/uL — ABNORMAL LOW (ref 150–400)
RBC: 3.64 MIL/uL — ABNORMAL LOW (ref 4.22–5.81)
RDW: 13.4 % (ref 11.5–15.5)
WBC: 4.2 10*3/uL (ref 4.0–10.5)

## 2014-01-20 LAB — COMPREHENSIVE METABOLIC PANEL
ALT: 48 U/L (ref 0–53)
AST: 48 U/L — ABNORMAL HIGH (ref 0–37)
Albumin: 2.8 g/dL — ABNORMAL LOW (ref 3.5–5.2)
Alkaline Phosphatase: 70 U/L (ref 39–117)
BUN: 7 mg/dL (ref 6–23)
CO2: 24 mEq/L (ref 19–32)
Calcium: 7.7 mg/dL — ABNORMAL LOW (ref 8.4–10.5)
Chloride: 97 mEq/L (ref 96–112)
Creatinine, Ser: 0.55 mg/dL (ref 0.50–1.35)
GFR calc Af Amer: >90 mL/min (ref >90)
GFR calc non Af Amer: >90 mL/min (ref >90)
Glucose, Bld: 173 mg/dL — ABNORMAL HIGH (ref 70–99)
Potassium: 3.5 mEq/L — ABNORMAL LOW (ref 3.7–5.3)
Sodium: 138 mEq/L (ref 137–147)
Total Bilirubin: 0.4 mg/dL (ref 0.3–1.2)
Total Protein: 5.8 g/dL — ABNORMAL LOW (ref 6.0–8.3)

## 2014-01-20 LAB — URINALYSIS, ROUTINE W REFLEX MICROSCOPIC
Bilirubin Urine: NEGATIVE
Glucose, UA: 250 mg/dL — AB
Ketones, ur: NEGATIVE mg/dL
Leukocytes, UA: NEGATIVE
Nitrite: NEGATIVE
Protein, ur: NEGATIVE mg/dL
Specific Gravity, Urine: 1.009 (ref 1.005–1.030)
Urobilinogen, UA: 1 mg/dL (ref 0.0–1.0)
pH: 7.5 (ref 5.0–8.0)

## 2014-01-20 LAB — LACTIC ACID, PLASMA: Lactic Acid, Venous: 4.2 mmol/L — ABNORMAL HIGH (ref 0.5–2.2)

## 2014-01-20 LAB — URINE MICROSCOPIC-ADD ON

## 2014-01-20 LAB — MAGNESIUM: Magnesium: 1.7 mg/dL (ref 1.5–2.5)

## 2014-01-20 LAB — VALPROIC ACID LEVEL: Valproic Acid Lvl: 63.9 ug/mL (ref 50.0–100.0)

## 2014-01-20 MED ORDER — ALBUTEROL SULFATE (2.5 MG/3ML) 0.083% IN NEBU
2.5000 mg | INHALATION_SOLUTION | Freq: Four times a day (QID) | RESPIRATORY_TRACT | Status: DC
  Administered 2014-01-20 – 2014-01-21 (×5): 2.5 mg via RESPIRATORY_TRACT
  Filled 2014-01-20 (×5): qty 3

## 2014-01-20 MED ORDER — AMLODIPINE BESYLATE 5 MG PO TABS
5.0000 mg | ORAL_TABLET | Freq: Every day | ORAL | Status: DC
  Administered 2014-01-20 – 2014-01-21 (×2): 5 mg via ORAL
  Filled 2014-01-20 (×2): qty 1

## 2014-01-20 MED ORDER — PANTOPRAZOLE SODIUM 40 MG PO TBEC
40.0000 mg | DELAYED_RELEASE_TABLET | Freq: Every day | ORAL | Status: DC
  Administered 2014-01-21: 40 mg via ORAL
  Filled 2014-01-20: qty 1

## 2014-01-20 NOTE — Consult Note (Signed)
NEURO HOSPITALIST CONSULT NOTE    Reason for Consult:seizures, recommendations about AED management.  HPI:                                                                                                                                          Gregory [name] is an 63 y.o. male with a past medical history significant for HTN, hyperlipidemia, CAD, OSA, smoking, carotid artery occlusion, bipolar disorder, alcohol dependence, asthma, and seizures, admitted to [facility] due to generalized weakness/hypotension and asthma exacerbation. [name] stated that he was diagnosed with nocturnal frontal lobe seizures several years ago " after a sleep study showed that I was having pedaling movements during sleep and the electrodes in my head were misfiring".  He said that he was placed on a combination of Lamictal and Depakote that have been able to control his seizures  " although I think that I am still having seizures at night". However, his wife denies noticing any paroxsymal movements during sleep suspicious for seizures. Stated that " the only apparent side effect from my seizure medications is tremors in my hands".  Denies having other type of spells and is quite sure that his seizures never happened during the daytime. He tells me that he was taking his Lamictal and Depakote religiously until recently when he ran out of medications and his physician forgot to send a refill. Review of his records revealed some concerns regarding adherence to treatment. However, VPA level was therapeutic 63.9 during this admission and Lamotrigine is in process . There is a mild, non significant elevation AST and ALT elevation on today's labs.     Past Medical History  Diagnosis Date  . Allergy   . Hypertension   . Bipolar disorder   . Alcohol abuse   . Tobacco use disorder   . Sleep apnea   . Asthma   . Aortic stenosis   . Neuromuscular disorder   . Family history of skin cancer   .  Hyperlipidemia   . RBBB     a. 7.2012 low risk nuclear exam, sm area of apical ischemia.  . Carotid artery occlusion   . Claudication   . Smoker   . Chronic back pain   . CAD (coronary artery disease)     Past Surgical History  Procedure Laterality Date  . Colonoscopy  2009  . Vasectomy reversal    . US echocardiography  06/20/2010    EF 55-60%  . Anal fistulectomy  09/25/11  . Vasectomy    . Elbow surgery      bilaterally for cubital tunnel    Family History  Problem Relation Age of Onset  . Stroke Father 47  . Cancer Father     skin  . Hypertension Brother   .  Diabetes Brother   . Cancer Maternal Aunt     skin  . Cancer Maternal Grandmother     liver    Social History:  reports that he has been smoking Cigarettes.  He has a 40 pack-year smoking history. He has never used smokeless tobacco. He reports that he drinks about 14.0 ounces of alcohol per week. He reports that he does not use illicit drugs.  Allergies  Allergen Reactions  . Codeine Anaphylaxis  . Amoxicillin Diarrhea  . Augmentin [Amoxicillin-Pot Clavulanate] Diarrhea    MEDICATIONS:                                                                                                                     I have reviewed the patient's current medications.   ROS:                                                                                                                                       History obtained from the patient and chart review.  General ROS: negative for - chills, fatigue, fever, night sweats, weight gain or weight loss Psychological ROS: negative for - memory difficulties Ophthalmic ROS: negative for - blurry vision, double vision, eye pain or loss of vision ENT ROS: negative for - epistaxis, nasal discharge, oral lesions, sore throat, tinnitus or vertigo Allergy and Immunology ROS: negative for - hives or itchy/watery eyes Hematological and Lymphatic ROS: negative for - bleeding problems,  bruising or swollen lymph nodes Endocrine ROS: negative for - galactorrhea, hair pattern changes, polydipsia/polyuria or temperature intolerance Respiratory ROS: negative for - cough, hemoptysis, shortness of breath or wheezing Cardiovascular ROS: negative for - chest pain, dyspnea on exertion, edema or irregular heartbeat Gastrointestinal ROS: negative for - abdominal pain, diarrhea, hematemesis, nausea/vomiting or stool incontinence Genito-Urinary ROS: negative for - dysuria, hematuria, incontinence or urinary frequency/urgency Musculoskeletal ROS: negative for - joint swelling or muscular weakness Neurological ROS: as noted in HPI Dermatological ROS: negative for rash and skin lesion changes  Physical exam: pleasant male in no apparent distress. Blood pressure 158/80, pulse 73, temperature 97.9 F (36.6 C), temperature source Oral, resp. rate 18, height 5' 5"  (1.651 m), weight 65.545 kg (144 lb 8 oz), SpO2 98.00%. Head: normocephalic. Neck: supple, no bruits, no JVD. Cardiac: no murmurs. Lungs: clear. Abdomen: soft, no tender, no mass. Extremities: no edema. CV: pulses palpable throughout  Neurologic Examination:  Mental Status: Alert, oriented, thought content appropriate.  Speech fluent without evidence of aphasia.  Able to follow 3 step commands without difficulty. Cranial Nerves: II: Discs flat bilaterally; Visual fields grossly normal, pupils equal, round, reactive to light and accommodation III,IV, VI: ptosis not present, extra-ocular motions intact bilaterally V,VII: smile symmetric, facial light touch sensation normal bilaterally VIII: hearing normal bilaterally IX,X: gag reflex present XI: bilateral shoulder shrug XII: midline tongue extension without atrophy or fasciculations  Motor: Right : Upper extremity   5/5    Left:     Upper extremity   5/5  Lower extremity    5/5     Lower extremity   5/5 Tone and bulk:normal tone throughout; no atrophy noted Sensory: Pinprick and light touch intact throughout, bilaterally Deep Tendon Reflexes:  Right: Upper Extremity   Left: Upper extremity   biceps (C-5 to C-6) 2/4   biceps (C-5 to C-6) 2/4 tricep (C7) 2/4    triceps (C7) 2/4 Brachioradialis (C6) 2/4  Brachioradialis (C6) 2/4  Lower Extremity Lower Extremity  quadriceps (L-2 to L-4) 2/4   quadriceps (L-2 to L-4) 2/4 Achilles (S1) 2/4   Achilles (S1) 2/4  Plantars: Right: downgoing   Left: downgoing Coordination: Bilateral postural tremors with a cerebellar component. Gait:  No tested    Lab Results  Component Value Date/Time   CHOL 153 11/25/2012 11:17 AM    Results for orders placed during the hospital encounter of 01/19/14 (from the past 48 hour(s))  ETHANOL     Status: Abnormal   Collection Time    01/19/14 12:40 AM      Result Value Range   Alcohol, Ethyl (B) 391 (*) 0 - 11 mg/dL   Comment:            LOWEST DETECTABLE LIMIT FOR     SERUM ALCOHOL IS 11 mg/dL     FOR MEDICAL PURPOSES ONLY  COMPREHENSIVE METABOLIC PANEL     Status: Abnormal   Collection Time    01/19/14 12:40 AM      Result Value Range   Sodium 135 (*) 137 - 147 mEq/L   Potassium 4.0  3.7 - 5.3 mEq/L   Chloride 93 (*) 96 - 112 mEq/L   CO2 24  19 - 32 mEq/L   Glucose, Bld 102 (*) 70 - 99 mg/dL   BUN 9  6 - 23 mg/dL   Creatinine, Ser 0.68  0.50 - 1.35 mg/dL   Calcium 8.6  8.4 - 10.5 mg/dL   Total Protein 7.1  6.0 - 8.3 g/dL   Albumin 3.5  3.5 - 5.2 g/dL   AST 108 (*) 0 - 37 U/L   ALT 79 (*) 0 - 53 U/L   Alkaline Phosphatase 91  39 - 117 U/L   Total Bilirubin 0.3  0.3 - 1.2 mg/dL   GFR calc non Af Amer >90  >90 mL/min   GFR calc Af Amer >90  >90 mL/min   Comment: (NOTE)     The eGFR has been calculated using the CKD EPI equation.     This calculation has not been validated in all clinical situations.     eGFR's persistently <90 mL/min signify possible Chronic  Kidney     Disease.  CBC WITH DIFFERENTIAL     Status: Abnormal   Collection Time    01/19/14 12:40 AM      Result Value Range   WBC 7.4  4.0 - 10.5 K/uL   RBC 4.21 (*) 4.22 -  5.81 MIL/uL   Hemoglobin 13.6  13.0 - 17.0 g/dL   HCT 39.0  39.0 - 52.0 %   MCV 92.6  78.0 - 100.0 fL   MCH 32.3  26.0 - 34.0 pg   MCHC 34.9  30.0 - 36.0 g/dL   RDW 13.2  11.5 - 15.5 %   Platelets 133 (*) 150 - 400 K/uL   Neutrophils Relative % 39 (*) 43 - 77 %   Neutro Abs 2.9  1.7 - 7.7 K/uL   Lymphocytes Relative 49 (*) 12 - 46 %   Lymphs Abs 3.7  0.7 - 4.0 K/uL   Monocytes Relative 11  3 - 12 %   Monocytes Absolute 0.8  0.1 - 1.0 K/uL   Eosinophils Relative 1  0 - 5 %   Eosinophils Absolute 0.1  0.0 - 0.7 K/uL   Basophils Relative 0  0 - 1 %   Basophils Absolute 0.0  0.0 - 0.1 K/uL  TROPONIN I     Status: None   Collection Time    01/19/14 12:40 AM      Result Value Range   Troponin I <0.30  <0.30 ng/mL   Comment:            Due to the release kinetics of cTnI,     a negative result within the first hours     of the onset of symptoms does not rule out     myocardial infarction with certainty.     If myocardial infarction is still suspected,     repeat the test at appropriate intervals.  ACETAMINOPHEN LEVEL     Status: None   Collection Time    01/19/14 12:40 AM      Result Value Range   Acetaminophen (Tylenol), Serum <15.0  10 - 30 ug/mL   Comment:            THERAPEUTIC CONCENTRATIONS VARY     SIGNIFICANTLY. A RANGE OF 10-30     ug/mL MAY BE AN EFFECTIVE     CONCENTRATION FOR MANY PATIENTS.     HOWEVER, SOME ARE BEST TREATED     AT CONCENTRATIONS OUTSIDE THIS     RANGE.     ACETAMINOPHEN CONCENTRATIONS     >150 ug/mL AT 4 HOURS AFTER     INGESTION AND >50 ug/mL AT 12     HOURS AFTER INGESTION ARE     OFTEN ASSOCIATED WITH TOXIC     REACTIONS.  SALICYLATE LEVEL     Status: Abnormal   Collection Time    01/19/14 12:40 AM      Result Value Range   Salicylate Lvl <5.6 (*) 2.8 - 20.0  mg/dL  GLUCOSE, CAPILLARY     Status: None   Collection Time    01/19/14 12:46 AM      Result Value Range   Glucose-Capillary 91  70 - 99 mg/dL  CG4 I-STAT (LACTIC ACID)     Status: Abnormal   Collection Time    01/19/14  1:12 AM      Result Value Range   Lactic Acid, Venous 3.70 (*) 0.5 - 2.2 mmol/L  URINALYSIS, ROUTINE W REFLEX MICROSCOPIC     Status: Abnormal   Collection Time    01/19/14  2:53 AM      Result Value Range   Color, Urine YELLOW  YELLOW   APPearance CLOUDY (*) CLEAR   Specific Gravity, Urine 1.010  1.005 - 1.030   pH 6.5  5.0 -  8.0   Glucose, UA NEGATIVE  NEGATIVE mg/dL   Hgb urine dipstick MODERATE (*) NEGATIVE   Bilirubin Urine NEGATIVE  NEGATIVE   Ketones, ur NEGATIVE  NEGATIVE mg/dL   Protein, ur NEGATIVE  NEGATIVE mg/dL   Urobilinogen, UA 0.2  0.0 - 1.0 mg/dL   Nitrite NEGATIVE  NEGATIVE   Leukocytes, UA NEGATIVE  NEGATIVE  URINE RAPID DRUG SCREEN (HOSP PERFORMED)     Status: None   Collection Time    01/19/14  2:53 AM      Result Value Range   Opiates NONE DETECTED  NONE DETECTED   Cocaine NONE DETECTED  NONE DETECTED   Benzodiazepines NONE DETECTED  NONE DETECTED   Amphetamines NONE DETECTED  NONE DETECTED   Tetrahydrocannabinol NONE DETECTED  NONE DETECTED   Barbiturates NONE DETECTED  NONE DETECTED   Comment:            DRUG SCREEN FOR MEDICAL PURPOSES     ONLY.  IF CONFIRMATION IS NEEDED     FOR ANY PURPOSE, NOTIFY LAB     WITHIN 5 DAYS.                LOWEST DETECTABLE LIMITS     FOR URINE DRUG SCREEN     Drug Class       Cutoff (ng/mL)     Amphetamine      1000     Barbiturate      200     Benzodiazepine   001     Tricyclics       749     Opiates          300     Cocaine          300     THC              50  URINE MICROSCOPIC-ADD ON     Status: None   Collection Time    01/19/14  2:53 AM      Result Value Range   WBC, UA 0-2  <3 WBC/hpf   RBC / HPF 11-20  <3 RBC/hpf  INFLUENZA PANEL BY PCR (TYPE A & B, H1N1)     Status: None    Collection Time    01/19/14  7:20 AM      Result Value Range   Influenza A By PCR NEGATIVE  NEGATIVE   Influenza B By PCR NEGATIVE  NEGATIVE   H1N1 flu by pcr NOT DETECTED  NOT DETECTED   Comment:            The Xpert Flu assay (FDA approved for     nasal aspirates or washes and     nasopharyngeal swab specimens), is     intended as an aid in the diagnosis of     influenza and should not be used as     a sole basis for treatment.     Performed at [facility]  TROPONIN I     Status: None   Collection Time    01/19/14  9:00 AM      Result Value Range   Troponin I <0.30  <0.30 ng/mL   Comment:            Due to the release kinetics of cTnI,     a negative result within the first hours     of the onset of symptoms does not rule out     myocardial infarction with certainty.  If myocardial infarction is still suspected,     repeat the test at appropriate intervals.  LACTIC ACID, PLASMA     Status: Abnormal   Collection Time    01/19/14  9:00 AM      Result Value Range   Lactic Acid, Venous 4.4 (*) 0.5 - 2.2 mmol/L  COMPREHENSIVE METABOLIC PANEL     Status: Abnormal   Collection Time    01/19/14  9:00 AM      Result Value Range   Sodium 137  137 - 147 mEq/L   Potassium 3.8  3.7 - 5.3 mEq/L   Chloride 97  96 - 112 mEq/L   CO2 22  19 - 32 mEq/L   Glucose, Bld 104 (*) 70 - 99 mg/dL   BUN 6  6 - 23 mg/dL   Creatinine, Ser 0.59  0.50 - 1.35 mg/dL   Calcium 7.3 (*) 8.4 - 10.5 mg/dL   Total Protein 6.3  6.0 - 8.3 g/dL   Albumin 3.1 (*) 3.5 - 5.2 g/dL   AST 78 (*) 0 - 37 U/L   ALT 62 (*) 0 - 53 U/L   Alkaline Phosphatase 77  39 - 117 U/L   Total Bilirubin 0.3  0.3 - 1.2 mg/dL   GFR calc non Af Amer >90  >90 mL/min   GFR calc Af Amer >90  >90 mL/min   Comment: (NOTE)     The eGFR has been calculated using the CKD EPI equation.     This calculation has not been validated in all clinical situations.     eGFR's persistently <90 mL/min signify possible Chronic Kidney      Disease.  CBC WITH DIFFERENTIAL     Status: Abnormal   Collection Time    01/19/14  9:00 AM      Result Value Range   WBC 6.3  4.0 - 10.5 K/uL   RBC 3.72 (*) 4.22 - 5.81 MIL/uL   Hemoglobin 12.1 (*) 13.0 - 17.0 g/dL   HCT 34.9 (*) 39.0 - 52.0 %   MCV 93.8  78.0 - 100.0 fL   MCH 32.5  26.0 - 34.0 pg   MCHC 34.7  30.0 - 36.0 g/dL   RDW 13.5  11.5 - 15.5 %   Platelets 106 (*) 150 - 400 K/uL   Comment: SPECIMEN CHECKED FOR CLOTS     PLATELET COUNT CONFIRMED BY SMEAR   Neutrophils Relative % 61  43 - 77 %   Neutro Abs 3.9  1.7 - 7.7 K/uL   Lymphocytes Relative 27  12 - 46 %   Lymphs Abs 1.7  0.7 - 4.0 K/uL   Monocytes Relative 12  3 - 12 %   Monocytes Absolute 0.7  0.1 - 1.0 K/uL   Eosinophils Relative 0  0 - 5 %   Eosinophils Absolute 0.0  0.0 - 0.7 K/uL   Basophils Relative 0  0 - 1 %   Basophils Absolute 0.0  0.0 - 0.1 K/uL  VALPROIC ACID LEVEL     Status: Abnormal   Collection Time    01/19/14  9:00 AM      Result Value Range   Valproic Acid Lvl <10.0 (*) 50.0 - 100.0 ug/mL   Comment: Performed at [place]     Status: Abnormal   Collection Time    01/19/14  9:00 AM      Result Value Range   Ammonia 10 (*) 11 - 60 umol/L  COMPREHENSIVE  METABOLIC PANEL     Status: Abnormal   Collection Time    01/20/14  4:35 AM      Result Value Range   Sodium 138  137 - 147 mEq/L   Potassium 3.5 (*) 3.7 - 5.3 mEq/L   Chloride 97  96 - 112 mEq/L   CO2 24  19 - 32 mEq/L   Glucose, Bld 173 (*) 70 - 99 mg/dL   BUN 7  6 - 23 mg/dL   Creatinine, Ser 0.55  0.50 - 1.35 mg/dL   Calcium 7.7 (*) 8.4 - 10.5 mg/dL   Total Protein 5.8 (*) 6.0 - 8.3 g/dL   Albumin 2.8 (*) 3.5 - 5.2 g/dL   AST 48 (*) 0 - 37 U/L   ALT 48  0 - 53 U/L   Alkaline Phosphatase 70  39 - 117 U/L   Total Bilirubin 0.4  0.3 - 1.2 mg/dL   GFR calc non Af Amer >90  >90 mL/min   GFR calc Af Amer >90  >90 mL/min   Comment: (NOTE)     The eGFR has been calculated using the CKD EPI equation.     This  calculation has not been validated in all clinical situations.     eGFR's persistently <90 mL/min signify possible Chronic Kidney     Disease.  CBC WITH DIFFERENTIAL     Status: Abnormal   Collection Time    01/20/14  4:35 AM      Result Value Range   WBC 4.2  4.0 - 10.5 K/uL   RBC 3.64 (*) 4.22 - 5.81 MIL/uL   Hemoglobin 11.6 (*) 13.0 - 17.0 g/dL   HCT 34.5 (*) 39.0 - 52.0 %   MCV 94.8  78.0 - 100.0 fL   MCH 31.9  26.0 - 34.0 pg   MCHC 33.6  30.0 - 36.0 g/dL   RDW 13.4  11.5 - 15.5 %   Platelets 102 (*) 150 - 400 K/uL   Comment: CONSISTENT WITH PREVIOUS RESULT   Neutrophils Relative % 68  43 - 77 %   Neutro Abs 2.9  1.7 - 7.7 K/uL   Lymphocytes Relative 21  12 - 46 %   Lymphs Abs 0.9  0.7 - 4.0 K/uL   Monocytes Relative 11  3 - 12 %   Monocytes Absolute 0.5  0.1 - 1.0 K/uL   Eosinophils Relative 0  0 - 5 %   Eosinophils Absolute 0.0  0.0 - 0.7 K/uL   Basophils Relative 0  0 - 1 %   Basophils Absolute 0.0  0.0 - 0.1 K/uL  MAGNESIUM     Status: None   Collection Time    01/20/14  4:35 AM      Result Value Range   Magnesium 1.7  1.5 - 2.5 mg/dL  VALPROIC ACID LEVEL     Status: None   Collection Time    01/20/14  4:35 AM      Result Value Range   Valproic Acid Lvl 63.9  50.0 - 100.0 ug/mL   Comment: Performed at [place] ACID, PLASMA     Status: Abnormal   Collection Time    01/20/14  4:35 AM      Result Value Range   Lactic Acid, Venous 4.2 (*) 0.5 - 2.2 mmol/L  URINALYSIS, ROUTINE W REFLEX MICROSCOPIC     Status: Abnormal   Collection Time    01/20/14  4:08 PM      Result  Value Range   Color, Urine YELLOW  YELLOW   APPearance CLEAR  CLEAR   Specific Gravity, Urine 1.009  1.005 - 1.030   pH 7.5  5.0 - 8.0   Glucose, UA 250 (*) NEGATIVE mg/dL   Hgb urine dipstick SMALL (*) NEGATIVE   Bilirubin Urine NEGATIVE  NEGATIVE   Ketones, ur NEGATIVE  NEGATIVE mg/dL   Protein, ur NEGATIVE  NEGATIVE mg/dL   Urobilinogen, UA 1.0  0.0 - 1.0 mg/dL   Nitrite  NEGATIVE  NEGATIVE   Leukocytes, UA NEGATIVE  NEGATIVE  URINE MICROSCOPIC-ADD ON     Status: None   Collection Time    01/20/14  4:08 PM      Result Value Range   RBC / HPF 3-6  <3 RBC/hpf    Dg Chest Port 1 View  01/19/2014   CLINICAL DATA:  Syncope, hypotension  EXAM: PORTABLE CHEST - 1 VIEW  COMPARISON:  10/21/2013  FINDINGS: The heart size and mediastinal contours are within normal limits. Both lungs are clear. The visualized skeletal structures are unremarkable. Aortic atherosclerosis noted.  IMPRESSION: No active disease.   Electronically Signed   By: Daryll Brod M.D.   On: 01/19/2014 01:23   Assessment/Plan: 63 y/o male who carries a diagnosis of nocturnal frontal lobe seizures apparently very well controlled on a combination of Lamictal and Depakote. He drinks alcohol daily and there is a concern of potential liver toxicity from Depakote plus alcohol abuse. If he has nocturnal frontal lobe epilepsy, then his seizure control has been excellent (something rather unusual) and it will be reasonable to attempt monotherapy with Lamictal if feasible from a psych standpoint. I agree that Depakote is not the best choice due to his alcohol abuse. It is advisable to gather more solid data regarding his epilepsy syndrome with inpatient video-EEG monitoring which should be pursue in an epilepsy unit at a major academic center.  Dorian Pod, MD 01/20/2014, 4:48 PM [facility]

## 2014-01-20 NOTE — Consult Note (Signed)
[facility] Face-to-Face Psychiatry Consult   Reason for Consult:  Medication evaluation Referring Physician:  Dr Sherral Hammers  Gregory [name] is an 63 y.o. male. Total Time spent with patient: 30 minutes  Assessment: AXIS I:  Bipolar, Manic and Alcohol dependence AXIS II:  Deferred AXIS III:   Past Medical History  Diagnosis Date  . Allergy   . Hypertension   . Bipolar disorder   . Alcohol abuse   . Tobacco use disorder   . Sleep apnea   . Asthma   . Aortic stenosis   . Neuromuscular disorder   . Family history of skin cancer   . Hyperlipidemia   . RBBB     a. 7.2012 low risk nuclear exam, sm area of apical ischemia.  . Carotid artery occlusion   . Claudication   . Smoker   . Chronic back pain   . CAD (coronary artery disease)    AXIS IV:  other psychosocial or environmental problems and problems related to social environment AXIS V:  51-60 moderate symptoms  Plan:  No evidence of imminent risk to self or others at present.   Patient does not meet criteria for psychiatric inpatient admission. Supportive therapy provided about ongoing stressors. Discussed crisis plan, support from social network, calling 911, coming to the Emergency Department, and calling Suicide Hotline. Recommend CD IOP   Subjective:   Gregory [name] is a 63 y.o. male patient admitted with generalized weakness.  HPI:  Patient seemed to.  Patient is 63 year old married retired male who was admitted on the medical floor to complain of generalized weakness, loss of appetite .  He has been not taking his Lamictal for more than 7 days.  Patient has history of bipolar disorder and alcohol abuse.  Patient told that he has been very stable on his Depakote and to but recently this medication was not order and he has been out from his Lamictal.  Patient apparently fall but did not loss any consciousness.  His blood alcohol level is 391.  Patient admitted drinking heavily in the past few weeks.  Patient told that his wife  also drinks alcohol on a regular basis.  His primary care physician is Dr. Demetrios Isaacs.  Patient see Dr. Robina Ade for the management of bipolar disorder however there has been a major agreement that patient does not need to be seen on a regular basis. His psychiatrist agreed that patient can call once a year if needed. patient told the middle and Depakote as been working very well for him.  He denies any recent mania, psychosis, hallucination or any depression.  He admitted insomnia , anxiety and restlessness and that gets better when he drank alcohol.  Patient appears very tremulous , anxious and he has shakes.  Patient minimizes his drinking .  He has high liver enzymes.  Patient was seen by neurologist .  Patient has history of seizure disorder.  His last seizure was 7 years ago.  He has not seen a neurologist on a regular basis.  Patient lives with his wife.  His symptoms are grown.  He is retired.  Patient is cooperative.  He was not involved in any combative behavior.  He denies any hallucinations, paranoia , suicidal thoughts or homicidal thoughts.  He has one psychiatric hospitalization in 40 in [place] when he tried to kill himself by taking an overdose on the medication.   HPI Elements:   Location:  feeling weak, drinking alcohol more than usual. Quality:  Fair. Severity:  Mild to  moderate. Timing:  For past few weeks.  Past Psychiatric History: Past Medical History  Diagnosis Date  . Allergy   . Hypertension   . Bipolar disorder   . Alcohol abuse   . Tobacco use disorder   . Sleep apnea   . Asthma   . Aortic stenosis   . Neuromuscular disorder   . Family history of skin cancer   . Hyperlipidemia   . RBBB     a. 7.2012 low risk nuclear exam, sm area of apical ischemia.  . Carotid artery occlusion   . Claudication   . Smoker   . Chronic back pain   . CAD (coronary artery disease)     reports that he has been smoking Cigarettes.  He has a 40 pack-year smoking history. He has never used  smokeless tobacco. He reports that he drinks about 14.0 ounces of alcohol per week. He reports that he does not use illicit drugs. Family History  Problem Relation Age of Onset  . Stroke Father 63  . Cancer Father     skin  . Hypertension Brother   . Diabetes Brother   . Cancer Maternal Aunt     skin  . Cancer Maternal Grandmother     liver     Living Arrangements: Spouse/significant other   Abuse/Neglect [facility]) Physical Abuse: Denies Verbal Abuse: Denies Sexual Abuse: Denies Allergies:   Allergies  Allergen Reactions  . Codeine Anaphylaxis  . Amoxicillin Diarrhea  . Augmentin [Amoxicillin-Pot Clavulanate] Diarrhea    ACT Assessment Complete:  No:   Past Psychiatric History: Diagnosis:  Bipolar disorder, alcohol abuse   Hospitalizations:  Yes   Outpatient Care:  Dr. [name]  Substance Abuse Care:  Yes   Self-Mutilation:  Denies   Suicidal Attempts:  Yes   Homicidal Behaviors:  Denies    Violent Behaviors:  Yes    Place of Residence:  Lives with his wife Marital Status:  Married Employed/Unemployed:  Retired Family Supports:  Yes Objective: Blood pressure 158/80, pulse 73, temperature 97.9 F (36.6 C), temperature source Oral, resp. rate 18, height 5' 5"  (1.651 m), weight 144 lb 8 oz (65.545 kg), SpO2 98.00%.Body mass index is 24.05 kg/(m^2). Results for orders placed during the hospital encounter of 01/19/14 (from the past 72 hour(s))  ETHANOL     Status: Abnormal   Collection Time    01/19/14 12:40 AM      Result Value Range   Alcohol, Ethyl (B) 391 (*) 0 - 11 mg/dL   Comment:            LOWEST DETECTABLE LIMIT FOR     SERUM ALCOHOL IS 11 mg/dL     FOR MEDICAL PURPOSES ONLY  COMPREHENSIVE METABOLIC PANEL     Status: Abnormal   Collection Time    01/19/14 12:40 AM      Result Value Range   Sodium 135 (*) 137 - 147 mEq/L   Potassium 4.0  3.7 - 5.3 mEq/L   Chloride 93 (*) 96 - 112 mEq/L   CO2 24  19 - 32 mEq/L   Glucose, Bld 102 (*) 70 - 99 mg/dL   BUN 9  6 -  23 mg/dL   Creatinine, Ser 0.68  0.50 - 1.35 mg/dL   Calcium 8.6  8.4 - 10.5 mg/dL   Total Protein 7.1  6.0 - 8.3 g/dL   Albumin 3.5  3.5 - 5.2 g/dL   AST 108 (*) 0 - 37 U/L   ALT 79 (*) 0 -  53 U/L   Alkaline Phosphatase 91  39 - 117 U/L   Total Bilirubin 0.3  0.3 - 1.2 mg/dL   GFR calc non Af Amer >90  >90 mL/min   GFR calc Af Amer >90  >90 mL/min   Comment: (NOTE)     The eGFR has been calculated using the CKD EPI equation.     This calculation has not been validated in all clinical situations.     eGFR's persistently <90 mL/min signify possible Chronic Kidney     Disease.  CBC WITH DIFFERENTIAL     Status: Abnormal   Collection Time    01/19/14 12:40 AM      Result Value Range   WBC 7.4  4.0 - 10.5 K/uL   RBC 4.21 (*) 4.22 - 5.81 MIL/uL   Hemoglobin 13.6  13.0 - 17.0 g/dL   HCT 39.0  39.0 - 52.0 %   MCV 92.6  78.0 - 100.0 fL   MCH 32.3  26.0 - 34.0 pg   MCHC 34.9  30.0 - 36.0 g/dL   RDW 13.2  11.5 - 15.5 %   Platelets 133 (*) 150 - 400 K/uL   Neutrophils Relative % 39 (*) 43 - 77 %   Neutro Abs 2.9  1.7 - 7.7 K/uL   Lymphocytes Relative 49 (*) 12 - 46 %   Lymphs Abs 3.7  0.7 - 4.0 K/uL   Monocytes Relative 11  3 - 12 %   Monocytes Absolute 0.8  0.1 - 1.0 K/uL   Eosinophils Relative 1  0 - 5 %   Eosinophils Absolute 0.1  0.0 - 0.7 K/uL   Basophils Relative 0  0 - 1 %   Basophils Absolute 0.0  0.0 - 0.1 K/uL  TROPONIN I     Status: None   Collection Time    01/19/14 12:40 AM      Result Value Range   Troponin I <0.30  <0.30 ng/mL   Comment:            Due to the release kinetics of cTnI,     a negative result within the first hours     of the onset of symptoms does not rule out     myocardial infarction with certainty.     If myocardial infarction is still suspected,     repeat the test at appropriate intervals.  ACETAMINOPHEN LEVEL     Status: None   Collection Time    01/19/14 12:40 AM      Result Value Range   Acetaminophen (Tylenol), Serum <15.0  10 - 30  ug/mL   Comment:            THERAPEUTIC CONCENTRATIONS VARY     SIGNIFICANTLY. A RANGE OF 10-30     ug/mL MAY BE AN EFFECTIVE     CONCENTRATION FOR MANY PATIENTS.     HOWEVER, SOME ARE BEST TREATED     AT CONCENTRATIONS OUTSIDE THIS     RANGE.     ACETAMINOPHEN CONCENTRATIONS     >150 ug/mL AT 4 HOURS AFTER     INGESTION AND >50 ug/mL AT 12     HOURS AFTER INGESTION ARE     OFTEN ASSOCIATED WITH TOXIC     REACTIONS.  SALICYLATE LEVEL     Status: Abnormal   Collection Time    01/19/14 12:40 AM      Result Value Range   Salicylate Lvl <2.9 (*) 2.8 - 20.0 mg/dL  GLUCOSE,  CAPILLARY     Status: None   Collection Time    01/19/14 12:46 AM      Result Value Range   Glucose-Capillary 91  70 - 99 mg/dL  CG4 I-STAT (LACTIC ACID)     Status: Abnormal   Collection Time    01/19/14  1:12 AM      Result Value Range   Lactic Acid, Venous 3.70 (*) 0.5 - 2.2 mmol/L  URINALYSIS, ROUTINE W REFLEX MICROSCOPIC     Status: Abnormal   Collection Time    01/19/14  2:53 AM      Result Value Range   Color, Urine YELLOW  YELLOW   APPearance CLOUDY (*) CLEAR   Specific Gravity, Urine 1.010  1.005 - 1.030   pH 6.5  5.0 - 8.0   Glucose, UA NEGATIVE  NEGATIVE mg/dL   Hgb urine dipstick MODERATE (*) NEGATIVE   Bilirubin Urine NEGATIVE  NEGATIVE   Ketones, ur NEGATIVE  NEGATIVE mg/dL   Protein, ur NEGATIVE  NEGATIVE mg/dL   Urobilinogen, UA 0.2  0.0 - 1.0 mg/dL   Nitrite NEGATIVE  NEGATIVE   Leukocytes, UA NEGATIVE  NEGATIVE  URINE RAPID DRUG SCREEN (HOSP PERFORMED)     Status: None   Collection Time    01/19/14  2:53 AM      Result Value Range   Opiates NONE DETECTED  NONE DETECTED   Cocaine NONE DETECTED  NONE DETECTED   Benzodiazepines NONE DETECTED  NONE DETECTED   Amphetamines NONE DETECTED  NONE DETECTED   Tetrahydrocannabinol NONE DETECTED  NONE DETECTED   Barbiturates NONE DETECTED  NONE DETECTED   Comment:            DRUG SCREEN FOR MEDICAL PURPOSES     ONLY.  IF CONFIRMATION IS  NEEDED     FOR ANY PURPOSE, NOTIFY LAB     WITHIN 5 DAYS.                LOWEST DETECTABLE LIMITS     FOR URINE DRUG SCREEN     Drug Class       Cutoff (ng/mL)     Amphetamine      1000     Barbiturate      200     Benzodiazepine   258     Tricyclics       527     Opiates          300     Cocaine          300     THC              50  URINE MICROSCOPIC-ADD ON     Status: None   Collection Time    01/19/14  2:53 AM      Result Value Range   WBC, UA 0-2  <3 WBC/hpf   RBC / HPF 11-20  <3 RBC/hpf  INFLUENZA PANEL BY PCR (TYPE A & B, H1N1)     Status: None   Collection Time    01/19/14  7:20 AM      Result Value Range   Influenza A By PCR NEGATIVE  NEGATIVE   Influenza B By PCR NEGATIVE  NEGATIVE   H1N1 flu by pcr NOT DETECTED  NOT DETECTED   Comment:            The Xpert Flu assay (FDA approved for     nasal aspirates or washes and  nasopharyngeal swab specimens), is     intended as an aid in the diagnosis of     influenza and should not be used as     a sole basis for treatment.     Performed at [facility]  TROPONIN I     Status: None   Collection Time    01/19/14  9:00 AM      Result Value Range   Troponin I <0.30  <0.30 ng/mL   Comment:            Due to the release kinetics of cTnI,     a negative result within the first hours     of the onset of symptoms does not rule out     myocardial infarction with certainty.     If myocardial infarction is still suspected,     repeat the test at appropriate intervals.  LACTIC ACID, PLASMA     Status: Abnormal   Collection Time    01/19/14  9:00 AM      Result Value Range   Lactic Acid, Venous 4.4 (*) 0.5 - 2.2 mmol/L  COMPREHENSIVE METABOLIC PANEL     Status: Abnormal   Collection Time    01/19/14  9:00 AM      Result Value Range   Sodium 137  137 - 147 mEq/L   Potassium 3.8  3.7 - 5.3 mEq/L   Chloride 97  96 - 112 mEq/L   CO2 22  19 - 32 mEq/L   Glucose, Bld 104 (*) 70 - 99 mg/dL   BUN 6  6 - 23 mg/dL    Creatinine, Ser 0.59  0.50 - 1.35 mg/dL   Calcium 7.3 (*) 8.4 - 10.5 mg/dL   Total Protein 6.3  6.0 - 8.3 g/dL   Albumin 3.1 (*) 3.5 - 5.2 g/dL   AST 78 (*) 0 - 37 U/L   ALT 62 (*) 0 - 53 U/L   Alkaline Phosphatase 77  39 - 117 U/L   Total Bilirubin 0.3  0.3 - 1.2 mg/dL   GFR calc non Af Amer >90  >90 mL/min   GFR calc Af Amer >90  >90 mL/min   Comment: (NOTE)     The eGFR has been calculated using the CKD EPI equation.     This calculation has not been validated in all clinical situations.     eGFR's persistently <90 mL/min signify possible Chronic Kidney     Disease.  CBC WITH DIFFERENTIAL     Status: Abnormal   Collection Time    01/19/14  9:00 AM      Result Value Range   WBC 6.3  4.0 - 10.5 K/uL   RBC 3.72 (*) 4.22 - 5.81 MIL/uL   Hemoglobin 12.1 (*) 13.0 - 17.0 g/dL   HCT 34.9 (*) 39.0 - 52.0 %   MCV 93.8  78.0 - 100.0 fL   MCH 32.5  26.0 - 34.0 pg   MCHC 34.7  30.0 - 36.0 g/dL   RDW 13.5  11.5 - 15.5 %   Platelets 106 (*) 150 - 400 K/uL   Comment: SPECIMEN CHECKED FOR CLOTS     PLATELET COUNT CONFIRMED BY SMEAR   Neutrophils Relative % 61  43 - 77 %   Neutro Abs 3.9  1.7 - 7.7 K/uL   Lymphocytes Relative 27  12 - 46 %   Lymphs Abs 1.7  0.7 - 4.0 K/uL   Monocytes Relative 12  3 -  12 %   Monocytes Absolute 0.7  0.1 - 1.0 K/uL   Eosinophils Relative 0  0 - 5 %   Eosinophils Absolute 0.0  0.0 - 0.7 K/uL   Basophils Relative 0  0 - 1 %   Basophils Absolute 0.0  0.0 - 0.1 K/uL  VALPROIC ACID LEVEL     Status: Abnormal   Collection Time    01/19/14  9:00 AM      Result Value Range   Valproic Acid Lvl <10.0 (*) 50.0 - 100.0 ug/mL   Comment: Performed at [place]     Status: Abnormal   Collection Time    01/19/14  9:00 AM      Result Value Range   Ammonia 10 (*) 11 - 60 umol/L  COMPREHENSIVE METABOLIC PANEL     Status: Abnormal   Collection Time    01/20/14  4:35 AM      Result Value Range   Sodium 138  137 - 147 mEq/L   Potassium 3.5 (*) 3.7  - 5.3 mEq/L   Chloride 97  96 - 112 mEq/L   CO2 24  19 - 32 mEq/L   Glucose, Bld 173 (*) 70 - 99 mg/dL   BUN 7  6 - 23 mg/dL   Creatinine, Ser 0.55  0.50 - 1.35 mg/dL   Calcium 7.7 (*) 8.4 - 10.5 mg/dL   Total Protein 5.8 (*) 6.0 - 8.3 g/dL   Albumin 2.8 (*) 3.5 - 5.2 g/dL   AST 48 (*) 0 - 37 U/L   ALT 48  0 - 53 U/L   Alkaline Phosphatase 70  39 - 117 U/L   Total Bilirubin 0.4  0.3 - 1.2 mg/dL   GFR calc non Af Amer >90  >90 mL/min   GFR calc Af Amer >90  >90 mL/min   Comment: (NOTE)     The eGFR has been calculated using the CKD EPI equation.     This calculation has not been validated in all clinical situations.     eGFR's persistently <90 mL/min signify possible Chronic Kidney     Disease.  CBC WITH DIFFERENTIAL     Status: Abnormal   Collection Time    01/20/14  4:35 AM      Result Value Range   WBC 4.2  4.0 - 10.5 K/uL   RBC 3.64 (*) 4.22 - 5.81 MIL/uL   Hemoglobin 11.6 (*) 13.0 - 17.0 g/dL   HCT 34.5 (*) 39.0 - 52.0 %   MCV 94.8  78.0 - 100.0 fL   MCH 31.9  26.0 - 34.0 pg   MCHC 33.6  30.0 - 36.0 g/dL   RDW 13.4  11.5 - 15.5 %   Platelets 102 (*) 150 - 400 K/uL   Comment: CONSISTENT WITH PREVIOUS RESULT   Neutrophils Relative % 68  43 - 77 %   Neutro Abs 2.9  1.7 - 7.7 K/uL   Lymphocytes Relative 21  12 - 46 %   Lymphs Abs 0.9  0.7 - 4.0 K/uL   Monocytes Relative 11  3 - 12 %   Monocytes Absolute 0.5  0.1 - 1.0 K/uL   Eosinophils Relative 0  0 - 5 %   Eosinophils Absolute 0.0  0.0 - 0.7 K/uL   Basophils Relative 0  0 - 1 %   Basophils Absolute 0.0  0.0 - 0.1 K/uL  MAGNESIUM     Status: None   Collection Time  01/20/14  4:35 AM      Result Value Range   Magnesium 1.7  1.5 - 2.5 mg/dL  VALPROIC ACID LEVEL     Status: None   Collection Time    01/20/14  4:35 AM      Result Value Range   Valproic Acid Lvl 63.9  50.0 - 100.0 ug/mL   Comment: Performed at [place] ACID, PLASMA     Status: Abnormal   Collection Time    01/20/14  4:35 AM       Result Value Range   Lactic Acid, Venous 4.2 (*) 0.5 - 2.2 mmol/L   Labs are reviewed and are pertinent for  high liver enzymes and blood alcohol level 391.  Current Facility-Administered Medications  Medication Dose Route Frequency Provider Last Rate Last Dose  . 0.9 %  sodium chloride infusion   Intravenous Continuous Allie Bossier, MD 125 mL/hr at 01/20/14 1127    . acetaminophen (TYLENOL) tablet 650 mg  650 mg Oral Q6H PRN Rise Patience, MD       Or  . acetaminophen (TYLENOL) suppository 650 mg  650 mg Rectal Q6H PRN Rise Patience, MD      . albuterol (PROVENTIL) (2.5 MG/3ML) 0.083% nebulizer solution 2.5 mg  2.5 mg Nebulization Q2H PRN Rise Patience, MD   2.5 mg at 01/19/14 1215  . albuterol (PROVENTIL) (2.5 MG/3ML) 0.083% nebulizer solution 2.5 mg  2.5 mg Nebulization QID Rise Patience, MD   2.5 mg at 01/20/14 1100  . amLODipine (NORVASC) tablet 5 mg  5 mg Oral Daily Allie Bossier, MD   5 mg at 01/20/14 1202  . aspirin EC tablet 81 mg  81 mg Oral Daily Rise Patience, MD   81 mg at 01/20/14 1001  . atorvastatin (LIPITOR) tablet 20 mg  20 mg Oral Daily Rise Patience, MD   20 mg at 01/20/14 1001  . budesonide (PULMICORT) nebulizer solution 0.25 mg  0.25 mg Nebulization BID Rise Patience, MD   0.25 mg at 01/20/14 0717  . divalproex (DEPAKOTE) DR tablet 2,000 mg  2,000 mg Oral QHS Rise Patience, MD   2,000 mg at 01/19/14 2142  . fluticasone (FLONASE) 50 MCG/ACT nasal spray 2 spray  2 spray Each Nare Daily Rise Patience, MD   2 spray at 01/20/14 1000  . folic acid (FOLVITE) tablet 1 mg  1 mg Oral Daily Rise Patience, MD   1 mg at 01/20/14 1001  . lamoTRIgine (LAMICTAL) tablet 200 mg  200 mg Oral q morning - 10a Rise Patience, MD   200 mg at 01/20/14 1000  . levofloxacin (LEVAQUIN) IVPB 500 mg  500 mg Intravenous Q24H Colleen E Summe, RPH   500 mg at 01/20/14 0824  . LORazepam (ATIVAN) injection 0-4 mg  0-4 mg Intravenous Q6H  Rise Patience, MD   1 mg at 01/20/14 0830   Followed by  . [START ON 01/21/2014] LORazepam (ATIVAN) injection 0-4 mg  0-4 mg Intravenous Q12H Rise Patience, MD      . LORazepam (ATIVAN) tablet 1 mg  1 mg Oral Q6H PRN Rise Patience, MD       Or  . LORazepam (ATIVAN) injection 1 mg  1 mg Intravenous Q6H PRN Rise Patience, MD   1 mg at 01/20/14 0839  . methylPREDNISolone sodium succinate (SOLU-MEDROL) 40 mg/mL injection 40 mg  40 mg Intravenous Daily Rise Patience,  MD   40 mg at 01/20/14 0959  . multivitamin with minerals tablet 1 tablet  1 tablet Oral q morning - 10a Rise Patience, MD   1 tablet at 01/20/14 1001  . nicotine (NICODERM CQ - dosed in mg/24 hours) patch 21 mg  21 mg Transdermal Daily Allie Bossier, MD   21 mg at 01/20/14 1000  . ondansetron (ZOFRAN) tablet 4 mg  4 mg Oral Q6H PRN Rise Patience, MD       Or  . ondansetron [facility]) injection 4 mg  4 mg Intravenous Q6H PRN Rise Patience, MD      . Derrill Memo ON 01/21/2014] pantoprazole (PROTONIX) EC tablet 40 mg  40 mg Oral Daily Clovis Riley, RPH      . sodium chloride 0.9 % injection 3 mL  3 mL Intravenous Q12H Rise Patience, MD      . thiamine (VITAMIN B-1) tablet 100 mg  100 mg Oral Daily Rise Patience, MD   100 mg at 01/20/14 1000   Or  . thiamine (B-1) injection 100 mg  100 mg Intravenous Daily Rise Patience, MD        Psychiatric Specialty Exam:     Blood pressure 158/80, pulse 73, temperature 97.9 F (36.6 C), temperature source Oral, resp. rate 18, height 5' 5"  (1.651 m), weight 144 lb 8 oz (65.545 kg), SpO2 98.00%.Body mass index is 24.05 kg/(m^2).  General Appearance: Casual and Guarded  Eye Contact::  Fair  Speech:  Slow  Volume:  Decreased  Mood:  Anxious  Affect:  Congruent  Thought Process:  Coherent  Orientation:  Full (Time, Place, and Person)  Thought Content:  Rumination  Suicidal Thoughts:  No  Homicidal Thoughts:  No  Memory:   Immediate;   Fair Recent;   Fair Remote;   Fair  Judgement:  Fair  Insight:  Fair  Psychomotor Activity:  Increased and Restlessness  Concentration:  Fair  Recall:  [employer] of Knowledge:Fair  Language: Fair  Akathisia:  No  Handed:  Right  AIMS (if indicated):     Assets:  Communication Skills Desire for Improvement Housing Social Support  Sleep:      Musculoskeletal: Strength & Muscle Tone: tremors Gait & Station: unsteady Patient leans: Patient lying on the bed  Treatment Plan Summary: Medication management Patient elected to continue Lamictal and Depakote since it did help him in the past.  Recently the 2 ran out of Lamictal he may have a fall.  Lamictal level is still in process.  I explained that given the fact he has drinking heavily and he has high liver enzymes he should consider CD IOP program.  Patient to stop drinking and does not want to change his Lamictal and Depakote.  His last Depakote level is therapeutic.  The patient does not meet criteria for inpatient psychiatric services.  She is scheduled to see Dr. Wylene Simmer in February.  I recommend to see her early if possible however if he is going to start CD IOP program, patient will be seen by provider, case manager and therapist.  Social worker please arrange appointment with this psychiatrist and referral to CD IOP.  Patient is not interested in rehabilitation at this time.  Continue Ativan and on CIWA protocol.  Please call [phone] for the question.  Hope Holst T. 01/20/2014 2:05 PM

## 2014-01-20 NOTE — Progress Notes (Signed)
This patient is receiving pantoprazole. Based on criteria approved by the Pharmacy and Therapeutics Committee, this medication is being converted to the equivalent oral dose form. These criteria include:   . The patient is eating (either orally or per tube) and/or has been taking other orally administered medications for at least 24 hours.  . This patient has no evidence of active gastrointestinal bleeding or impaired GI absorption (gastrectomy, short bowel, patient on TNA or NPO).   If you have questions about this conversion, please contact the pharmacy department.  Clovis Riley, [facility] 01/20/2014 1:53 PM
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
## 2014-01-21 DIAGNOSIS — J45909 Unspecified asthma, uncomplicated: Secondary | ICD-10-CM

## 2014-01-21 DIAGNOSIS — F17200 Nicotine dependence, unspecified, uncomplicated: Secondary | ICD-10-CM

## 2014-01-21 LAB — CBC WITH DIFFERENTIAL/PLATELET
Basophils Absolute: 0 10*3/uL (ref 0.0–0.1)
Basophils Relative: 0 % (ref 0–1)
Eosinophils Absolute: 0 10*3/uL (ref 0.0–0.7)
Eosinophils Relative: 0 % (ref 0–5)
HCT: 34 % — ABNORMAL LOW (ref 39.0–52.0)
Hemoglobin: 11.4 g/dL — ABNORMAL LOW (ref 13.0–17.0)
Lymphocytes Relative: 27 % (ref 12–46)
Lymphs Abs: 1.4 10*3/uL (ref 0.7–4.0)
MCH: 32 pg (ref 26.0–34.0)
MCHC: 33.5 g/dL (ref 30.0–36.0)
MCV: 95.5 fL (ref 78.0–100.0)
Monocytes Absolute: 0.7 10*3/uL (ref 0.1–1.0)
Monocytes Relative: 14 % — ABNORMAL HIGH (ref 3–12)
Neutro Abs: 3 10*3/uL (ref 1.7–7.7)
Neutrophils Relative %: 58 % (ref 43–77)
Platelets: 108 10*3/uL — ABNORMAL LOW (ref 150–400)
RBC: 3.56 MIL/uL — ABNORMAL LOW (ref 4.22–5.81)
RDW: 13.5 % (ref 11.5–15.5)
WBC: 5.1 10*3/uL (ref 4.0–10.5)

## 2014-01-21 LAB — COMPREHENSIVE METABOLIC PANEL
ALT: 42 U/L (ref 0–53)
AST: 35 U/L (ref 0–37)
Albumin: 2.8 g/dL — ABNORMAL LOW (ref 3.5–5.2)
Alkaline Phosphatase: 63 U/L (ref 39–117)
BUN: 9 mg/dL (ref 6–23)
CO2: 28 mEq/L (ref 19–32)
Calcium: 8.4 mg/dL (ref 8.4–10.5)
Chloride: 101 mEq/L (ref 96–112)
Creatinine, Ser: 0.63 mg/dL (ref 0.50–1.35)
GFR calc Af Amer: >90 mL/min (ref >90)
GFR calc non Af Amer: >90 mL/min (ref >90)
Glucose, Bld: 105 mg/dL — ABNORMAL HIGH (ref 70–99)
Potassium: 3.4 mEq/L — ABNORMAL LOW (ref 3.7–5.3)
Sodium: 141 mEq/L (ref 137–147)
Total Bilirubin: 0.4 mg/dL (ref 0.3–1.2)
Total Protein: 6.2 g/dL (ref 6.0–8.3)

## 2014-01-21 LAB — LAMOTRIGINE LEVEL: Lamotrigine Lvl: 3 ug/mL (ref 3.0–14.0)

## 2014-01-21 LAB — MAGNESIUM: Magnesium: 1.9 mg/dL (ref 1.5–2.5)

## 2014-01-21 MED ORDER — LEVOFLOXACIN 750 MG PO TABS: 750.0000 mg | ORAL_TABLET | Freq: Every day | ORAL | Status: DC

## 2014-01-21 MED ORDER — NICOTINE 21 MG/24HR TD PT24: 21.0000 mg | MEDICATED_PATCH | Freq: Every day | TRANSDERMAL | Status: DC

## 2014-01-21 NOTE — Progress Notes (Signed)
   CARE MANAGEMENT NOTE 01/21/2014  Patient:  Gregory [name], Gregory [name]   Account Number:  [id number]  Date Initiated:  01/21/2014  Documentation initiated by:  [facility]  Subjective/Objective Assessment:   ETOH, Seizures, Bipolar     Action/Plan:   lives at home with wife, Velta Addison # [phone]   Anticipated DC Date:  01/21/2014   Anticipated DC Plan:  [place]  CM consult      [facility] Choice  HOME HEALTH   Choice offered to / List presented to:  C-3 Spouse   DME arranged  Vassie Moselle      DME agency  [place] arranged  HH-2 PT      [place]   Status of service:  Completed, signed off Medicare Important Message given?   (If response is "NO", the following Medicare IM given date fields will be blank) Date Medicare IM given:   Date Additional Medicare IM given:    Discharge Disposition:  [place]  Per UR Regulation:    If discussed at Long Length of Stay Meetings, dates discussed:    Comments:  01/21/2014 1200 NCM spoke to pt and gave permission to speak to wife, Velta Addison. Provided [facility] list and offered choice. Wife agreeable to [facility] or [place]. Spoke to [facility] and soc is 7-10 days out. Contacted [place] and they can follow up on Wed. Contacted [facility] for RW for home. Arranged HH with [place]. Jonnie Finner RN CCM Case Mgmt phone [phone]

## 2014-01-21 NOTE — Progress Notes (Signed)
Asked by MD to meet with Pt to review outpt tx info.  Pt's wife present and stated that she has all the information given to her from [place], yesterday.  Pt's wife stated that Pt was confused when [facility] met with them and that he doesn't remember much.    Pt and wife had no additional questions.  CSW thanked Pt and his wife for their time.  Notified MD.  Bernita Raisin, [place] [facility] [phone]

## 2014-01-21 NOTE — Evaluation (Signed)
Physical Therapy Evaluation Patient Details Name: Gregory [name] MRN: [id number] DOB: 1951-03-18 Today's Date: 01/21/2014 Time: 7425-9563 PT Time Calculation (min): 35 min  PT Assessment / Plan / Recommendation History of Present Illness  per H & P " Gregory [name] is a 63 y.o. male with history of hypertension, moderate aortic stenosis, bipolar disorder, history of seizures from mom hyperlipidemia, tobacco abuse was brought to the ER patient was found to be increasingly weak by patient's wife. Patient wife was concerned that she was about to leave town. Patient wife patient has not been eating well for last few days but did not have any nausea vomiting or abdominal pain diarrhea. Patient has been having productive cough with shortness of breath for last 3 weeks. Denies any chest pain. En route to the ER patient fell on the ER but did not lose consciousness. Patient's wife felt it difficult to raise him up. EMS was called and patient was brought to the ER. Patient was found to be hypotensive with elevated lactic acid levels. Patient at this time has received 3 L normal saline bolus and displacement pressure is stable and not tachycardic. In addition patient was found to be wheezing. Chest x-ray does not show anything acute. Patient did not have any seizure-like activities or any tongue bite or incontinence of urine. Patient will be admitted for further workup. Alcohol levels are elevated. Patient has had a cardiac stress test in October which was showing intermediate risk as per patient's cardiologist has advised continued medical therapy"  Clinical Impression  Pt with great weakness and shuffled gait, feel he would benefit form continued PT her and at home to continue to gain strength, and safety with assistive device and progress to no assistive device according to his goals. Pt stated he would prefer to not have HHPT come out, and I spoke to him about this, however I recommend it. He wanted to wait  to see how he does. Will continue to follow him while here to help progress his mobility ability to a modified independent level.     PT Assessment  Patient needs continued PT services    Follow Up Recommendations  Home health PT (I recommend, however pt stated he wanted to wait and see how he did first. )    Does the patient have the potential to tolerate intense rehabilitation      Barriers to Discharge        Equipment Recommendations  Rolling walker with 5" wheels (needs RW, he thinks he can just vorrow ours , but that is incorrect. )    Recommendations for Other Services     Frequency Min 3X/week    Precautions / Restrictions     Pertinent Vitals/Pain Denies no pain      Mobility  Bed Mobility Overal bed mobility: Modified Independent Transfers Overall transfer level: Needs assistance Equipment used: Rolling walker (2 wheeled) Transfers: Sit to/from Stand Sit to Stand: Min guard General transfer comment: attempted to get up without it and stated " I need the RW" and he did. Ambulation/Gait Ambulation/Gait assistance: Min guard Ambulation Distance (Feet): 60 Feet (with 2 seated rest breaks ) Assistive device: Rolling walker (2 wheeled) Gait Pattern/deviations: Shuffle;Step-through pattern Gait velocity: slow General Gait Details: shuffled gait, very little foot clearance on either side, and flexed posture at RW, even though cues. Unsafe turn to sit when tired. Had to sit every 20 feet to rest.    Exercises     PT Diagnosis: Difficulty walking;Generalized weakness  PT Problem List: Decreased strength;Decreased activity tolerance;Decreased balance;Decreased mobility;Decreased safety awareness;Decreased knowledge of use of DME PT Treatment Interventions: DME instruction;Gait training;Stair training;Functional mobility training;Therapeutic activities;Therapeutic exercise;Patient/family education     PT Goals(Current goals can be found in the care plan  section) Acute Rehab PT Goals Patient Stated Goal: I want to go home tonight PT Goal Formulation: With patient Time For Goal Achievement: 01/21/14 Potential to Achieve Goals: Good  Visit Information  Last PT Received On: 01/21/14 Assistance Needed: +1 History of Present Illness: per H & P " Gregory [name] is a 63 y.o. male with history of hypertension, moderate aortic stenosis, bipolar disorder, history of seizures from mom hyperlipidemia, tobacco abuse was brought to the ER patient was found to be increasingly weak by patient's wife. Patient wife was concerned that she was about to leave town. Patient wife patient has not been eating well for last few days but did not have any nausea vomiting or abdominal pain diarrhea. Patient has been having productive cough with shortness of breath for last 3 weeks. Denies any chest pain. En route to the ER patient fell on the ER but did not lose consciousness. Patient's wife felt it difficult to raise him up. EMS was called and patient was brought to the ER. Patient was found to be hypotensive with elevated lactic acid levels. Patient at this time has received 3 L normal saline bolus and displacement pressure is stable and not tachycardic. In addition patient was found to be wheezing. Chest x-ray does not show anything acute. Patient did not have any seizure-like activities or any tongue bite or incontinence of urine. Patient will be admitted for further workup. Alcohol levels are elevated. Patient has had a cardiac stress test in October which was showing intermediate risk as per patient's cardiologist has advised continued medical therapy"       Prior [place] expects to be discharged to:: Private residence Living Arrangements: Spouse/significant other Available Help at Discharge: Family (Wife is goign on trip away from home starting this Sunday, so he will be alone) Type of Home: House Home Access: Stairs to enter [employer] of Steps: 4 Entrance Stairs-Rails: Right Home Layout: One [place]: [place] - single point Communication Communication: No difficulties    Cognition  Cognition Arousal/Alertness: Awake/alert Behavior During Therapy: WFL for tasks assessed/performed Overall Cognitive Status: Within Functional Limits for tasks assessed    Extremity/Trunk Assessment Lower Extremity Assessment Lower Extremity Assessment: Generalized weakness   Balance    End of Session PT - End of Session Equipment Utilized During Treatment: Gait belt Activity Tolerance: Patient tolerated treatment well Patient left: in bed;with bed alarm set Nurse Communication: Mobility status  GP     Clide Dales 01/21/2014, 9:28 AM Clide Dales, PT Pager: [phone] 01/21/2014

## 2014-01-21 NOTE — Discharge Summary (Signed)
Physician Discharge Summary  Gregory [name] QBH:[id number] DOB: Oct 28, 1951 DOA: 01/19/2014  PCP: Wyatt Haste, MD  Admit date: 01/19/2014 Discharge date: 01/21/2014  Time spent: 40 minutes  Recommendations for Outpatient Follow-up:  ETOH intoxication  -Patient and wife provided information on CD-IOP and inpatient programs by LCSW Sindy Messing on 1/30 and reinforced by CSW [place] on 1/31. -PT recommends home physical therapy  Seizures  -Patient seen by Dr. Girtha Hake (neurology) recommends that patient obtain inpatient video-EEG monitoring which should be pursued in an epilepsy unit at a major academic center. -Will provide information patient to arrange appointment at [facility].  -Again counseled patient concerning his drinking, and mixing of bipolar/seizure medication and the potential outcomes to include death -discharge her current medication regimen    Bipolar  -Spoke with Dr.Kaur (psychiatry) [phone] patient's and her psychiatrist, and was informed that when her patient's are hospitalized she uses or behavioral health. Have contacted behavioral health for consult.  -Will continue patient on current medication regimen. Patient interested in following up at [facility] current medication await recommendations; addendum Dr.Syed Arfeen (psychiatry) saw patient today recommend patient be seen in the chemical dependency intensive outpatient program(CD IOP)  -Per Dr.Syed Arfeen (psychiatry) note the patient does not want to change his Lamictal and Depakote at this time.   Noncompliance  -Continue Lamictal and Depakote at current levels until seen by psychiatry as an outpatient     Asthma exacerbation  -Complete 7 day course Levofloxacin   Tobacco dependence  -Discharge with Nicotine patch.    OSA -Referred to [facility] pulmonology for spirometry and sleep study  -Continue  CPAP QHS   HTN  -Continue Amlodipine 5 mg daily     Discharge Diagnoses:  Principal Problem:   Hypotension Active Problems:   Bipolar disorder   Alcohol dependence   Tobacco use disorder   Aortic stenosis   Weakness   Asthma exacerbation   OSA (obstructive sleep apnea)   Discharge Condition: Stable  Diet recommendation: Regular  Filed Weights   01/19/14 0015 01/19/14 0750  Weight: 60.782 kg (134 lb) 65.545 kg (144 lb 8 oz)    History of present illness:  Gregory [name] 63 y.o WM PMHx hypertension, moderate aortic stenosis, bipolar disorder, history of seizures which was diagnosed 2007 (has not seen neurologist since then), Hyperlipidemia, tobacco abuse, OSA (not using CPAP as prescribed). Was brought to the ER patient was found to be increasingly weak by patient's wife. Patient wife was concerned because scheduled to leave town. Patient has not been eating well for last few days but did not have any nausea vomiting or abdominal pain diarrhea. Patient positive productive cough/SOB for last 3 weeks. Denies any chest pain. In route to the ER patient fell in the ER but did not lose consciousness. Patient's wife felt it was difficult to raise him up. EMS was called and patient was brought to the ER. Patient was found to be hypotensive with elevated lactic acid levels. Patient at this time has received 3 L normal saline bolus and displacement pressure is stable and not tachycardic. In addition patient was found to be wheezing. Chest x-ray does not show anything acute. Patient did not have any seizure-like activities or any tongue bite or incontinence of urine. Patient will be admitted for further workup. Alcohol levels are elevated. Patient has had a cardiac stress test in October which was showing intermediate risk as per patient's cardiologist has advised continued medical  therapy. ADDENDUM 1/29 patient admitted 0600 0n 1/29. followup labs show valproic acid at subtherapeutic level,  influenza panel negative, UDS negative, ethanol level 391mg /DL, elevated liver enzymes. 1/30 patient continues to smoke, has been smoking 1-1/2 PPD x38 years. 1/30 patient requested no when he can to discharge. States breathing much better with the use of CPAP, breathing treatments, and steroid treatment. Negative CP, SOB. Positive continued tremors. Also complains of new onset hematuria secondary to the Lovenox. 1/31 she continues to have poor understanding of all his disease processes (bipolar, substance abuse). However does express interest in attending some type of substance abuse program as well as establishing care with [facility] outpatient.      Consultants:  Dr.Syed Adele Schilder (psychiatry)  Dr. Girtha Hake Neuro hospitalists    Procedures:    Antibiotics:  Levofloxacin 1/29>>    Discharge Exam: Filed Vitals:   01/20/14 2128 01/20/14 2155 01/21/14 0005 01/21/14 0420  BP:  136/56  158/77  Pulse:  107 82 66  Temp:  98.1 F (36.7 C)  97.5 F (36.4 C)  TempSrc:  Oral  Oral  Resp:  20 18 20   Height:      Weight:      SpO2: 95% 95% 97% 95%   General: A./O. x4, NAD,  Cardiovascular: Regular rhythm and rate, negative murmurs rubs gallops, DP/PT pulse +1  Respiratory: Clear to auscultation bilateral  Abdomen: Soft, nontender, plus bowel sounds  Musculoskeletal: Negative pedal edema, cyanosis   Discharge Instructions     Medication List    ASK your doctor about these medications       albuterol 108 (90 BASE) MCG/ACT inhaler  Commonly known as:  PROVENTIL HFA;VENTOLIN HFA  Inhale 2 puffs into the lungs every 6 (six) hours as needed for wheezing.     amLODipine 5 MG tablet  Commonly known as:  NORVASC  Take 5 mg by mouth daily.     aspirin EC 81 MG tablet  Take 1 tablet (81 mg total) by mouth daily.     atorvastatin 20 MG tablet  Commonly known as:  LIPITOR  Take 1 tablet (20 mg total) by mouth daily.     divalproex 500 MG DR tablet  Commonly  known as:  DEPAKOTE  Take 4 tablets (2,000 mg total) by mouth at bedtime.     fluticasone 50 MCG/ACT nasal spray  Commonly known as:  FLONASE  Place 2 sprays into the nose daily.     Fluticasone-Salmeterol 500-50 MCG/DOSE Aepb  Commonly known as:  ADVAIR DISKUS  Inhale 1 puff into the lungs every 12 (twelve) hours.     lamoTRIgine 200 MG tablet  Commonly known as:  LAMICTAL  Take 1 tablet (200 mg total) by mouth every morning.     lisinopril-hydrochlorothiazide 20-12.5 MG per tablet  Commonly known as:  PRINZIDE,ZESTORETIC  Take 1 tablet by mouth every morning.     metoprolol tartrate 25 MG tablet  Commonly known as:  LOPRESSOR  Take 1 tablet (25 mg total) by mouth 2 (two) times daily.     multivitamin with minerals Tabs tablet  Take 1 tablet by mouth every morning.       Allergies  Allergen Reactions  . Codeine Anaphylaxis  . Amoxicillin Diarrhea  . Augmentin [Amoxicillin-Pot Clavulanate] Diarrhea      The results of significant diagnostics from this hospitalization (including imaging, microbiology, ancillary and laboratory) are listed below for reference.    Significant Diagnostic Studies: [facility] 1 [address]  01/19/2014   CLINICAL DATA:  Syncope, hypotension  EXAM: PORTABLE CHEST - 1 VIEW  COMPARISON:  10/21/2013  FINDINGS: The heart size and mediastinal contours are within normal limits. Both lungs are clear. The visualized skeletal structures are unremarkable. Aortic atherosclerosis noted.  IMPRESSION: No active disease.   Electronically Signed   By: Daryll Brod M.D.   On: 01/19/2014 01:23    Microbiology: No results found for this or any previous visit (from the past 240 hour(s)).   Labs: Basic Metabolic Panel:  Recent Labs Lab 01/19/14 0040 01/19/14 0900 01/20/14 0435 01/21/14 0606  NA 135* 137 138 141  K 4.0 3.8 3.5* 3.4*  CL 93* 97 97 101  CO2 24 22 24 28   GLUCOSE 102* 104* 173* 105*  BUN 9 6 7 9   CREATININE 0.68 0.59 0.55 0.63  CALCIUM 8.6  7.3* 7.7* 8.4  MG  --   --  1.7 1.9   Liver Function Tests:  Recent Labs Lab 01/19/14 0040 01/19/14 0900 01/20/14 0435 01/21/14 0606  AST 108* 78* 48* 35  ALT 79* 62* 48 42  ALKPHOS 91 77 70 63  BILITOT 0.3 0.3 0.4 0.4  PROT 7.1 6.3 5.8* 6.2  ALBUMIN 3.5 3.1* 2.8* 2.8*   No results found for this basename: LIPASE, AMYLASE,  in the last 168 hours  Recent Labs Lab 01/19/14 0900  AMMONIA 10*   CBC:  Recent Labs Lab 01/19/14 0040 01/19/14 0900 01/20/14 0435 01/21/14 0606  WBC 7.4 6.3 4.2 5.1  NEUTROABS 2.9 3.9 2.9 3.0  HGB 13.6 12.1* 11.6* 11.4*  HCT 39.0 34.9* 34.5* 34.0*  MCV 92.6 93.8 94.8 95.5  PLT 133* 106* 102* 108*   Cardiac Enzymes:  Recent Labs Lab 01/19/14 0040 01/19/14 0900  TROPONINI <0.30 <0.30   BNP: BNP (last 3 results)  Recent Labs  08/29/13 0100  PROBNP 182.2*   CBG:  Recent Labs Lab 01/19/14 0046  GLUCAP 91       Signed:  Dia Crawford, MD [facility] [phone] pager

## 2014-05-26 ENCOUNTER — Ambulatory Visit
Admission: RE | Admit: 2014-05-26 | Discharge: 2014-05-26 | Disposition: A | Discharge status: Home / Self Care | Payer: Medicare Other | Source: Ambulatory Visit | Attending: Medical | Admitting: Medical

## 2014-05-26 ENCOUNTER — Encounter: Payer: Self-pay | Admitting: Medical

## 2014-05-26 ENCOUNTER — Ambulatory Visit (INDEPENDENT_AMBULATORY_CARE_PROVIDER_SITE_OTHER): Payer: Medicare Other | Admitting: Medical

## 2014-05-26 ENCOUNTER — Other Ambulatory Visit: Payer: Self-pay | Admitting: Medical

## 2014-05-26 VITALS — BP 138/90 | HR 88 | Temp 98.5°F | Resp 20 | Wt 132.0 lb

## 2014-05-26 DIAGNOSIS — R160 Hepatomegaly, not elsewhere classified: Secondary | ICD-10-CM

## 2014-05-26 DIAGNOSIS — R059 Cough, unspecified: Secondary | ICD-10-CM

## 2014-05-26 DIAGNOSIS — J3489 Other specified disorders of nose and nasal sinuses: Secondary | ICD-10-CM

## 2014-05-26 DIAGNOSIS — Z79899 Other long term (current) drug therapy: Secondary | ICD-10-CM

## 2014-05-26 DIAGNOSIS — R05 Cough: Secondary | ICD-10-CM

## 2014-05-26 DIAGNOSIS — R42 Dizziness and giddiness: Secondary | ICD-10-CM

## 2014-05-26 DIAGNOSIS — F1021 Alcohol dependence, in remission: Secondary | ICD-10-CM

## 2014-05-26 DIAGNOSIS — Z87898 Personal history of other specified conditions: Secondary | ICD-10-CM

## 2014-05-26 DIAGNOSIS — E860 Dehydration: Secondary | ICD-10-CM

## 2014-05-26 DIAGNOSIS — R0981 Nasal congestion: Secondary | ICD-10-CM

## 2014-05-26 LAB — BASIC METABOLIC PANEL
BUN: 8 mg/dL (ref 6–23)
CO2: 25 mEq/L (ref 19–32)
Calcium: 9.1 mg/dL (ref 8.4–10.5)
Chloride: 97 mEq/L (ref 96–112)
Creat: 0.61 mg/dL (ref 0.50–1.35)
Glucose, Bld: 96 mg/dL (ref 70–99)
Potassium: 4.2 mEq/L (ref 3.5–5.3)
Sodium: 140 mEq/L (ref 135–145)

## 2014-05-26 LAB — CBC WITH DIFFERENTIAL/PLATELET
Basophils Absolute: 0 10*3/uL (ref 0.0–0.1)
Basophils Relative: 0 % (ref 0–1)
Eosinophils Absolute: 0 10*3/uL (ref 0.0–0.7)
Eosinophils Relative: 0 % (ref 0–5)
HCT: 45.4 % (ref 39.0–52.0)
Hemoglobin: 16.5 g/dL (ref 13.0–17.0)
Lymphocytes Relative: 18 % (ref 12–46)
Lymphs Abs: 1.1 10*3/uL (ref 0.7–4.0)
MCH: 32.8 pg (ref 26.0–34.0)
MCHC: 36.3 g/dL — ABNORMAL HIGH (ref 30.0–36.0)
MCV: 90.3 fL (ref 78.0–100.0)
Monocytes Absolute: 0.4 10*3/uL (ref 0.1–1.0)
Monocytes Relative: 7 % (ref 3–12)
Neutro Abs: 4.4 10*3/uL (ref 1.7–7.7)
Neutrophils Relative %: 75 % (ref 43–77)
Platelets: 134 10*3/uL — ABNORMAL LOW (ref 150–400)
RBC: 5.03 MIL/uL (ref 4.22–5.81)
RDW: 14.5 % (ref 11.5–15.5)
WBC: 5.9 10*3/uL (ref 4.0–10.5)

## 2014-05-26 LAB — HEPATIC FUNCTION PANEL
ALT: 57 U/L — ABNORMAL HIGH (ref 0–53)
AST: 85 U/L — ABNORMAL HIGH (ref 0–37)
Albumin: 4.4 g/dL (ref 3.5–5.2)
Alkaline Phosphatase: 77 U/L (ref 39–117)
Bilirubin, Direct: 0.2 mg/dL (ref 0.0–0.3)
Indirect Bilirubin: 0.5 mg/dL (ref 0.2–1.2)
Total Bilirubin: 0.7 mg/dL (ref 0.2–1.2)
Total Protein: 7.5 g/dL (ref 6.0–8.3)

## 2014-05-26 LAB — POCT URINALYSIS DIPSTICK
Bilirubin, UA: NEGATIVE
Blood, UA: NEGATIVE
Glucose, UA: NEGATIVE
Ketones, UA: POSITIVE
Leukocytes, UA: NEGATIVE
Nitrite, UA: NEGATIVE
Spec Grav, UA: 1.015
Urobilinogen, UA: NEGATIVE
pH, UA: 6

## 2014-05-26 LAB — VALPROIC ACID LEVEL: Valproic Acid Lvl: <12.5 ug/mL — ABNORMAL LOW (ref 50.0–100.0)

## 2014-05-26 MED ORDER — AZITHROMYCIN 250 MG PO TABS: ORAL_TABLET | ORAL | Status: DC

## 2014-05-26 NOTE — Addendum Note (Signed)
Addended by: Carlena Hurl on: 05/26/2014 03:09 PM   Modules accepted: Orders

## 2014-05-26 NOTE — Progress Notes (Signed)
Subjective:   Gregory [name] is a 63 y.o. male presenting on 05/26/2014 with head and chest congestion  Here for head cold, not feeling well, wants an antibiotic.  Few weeks ago with bad pollen, was having bad allergies and asthma flare up.  Used OTC medication, but in the last week, things have gone from bad to worse.  He reports sinus pressure, not feeling well, back of chest hurts, dizzy feeling, feels a little disoriented.  Denies SOB.  No frontal chest pain.  No edema. No fever.  Thinks he needs an antibiotic.  Has had chills.  No sweats.   Has had some nausea and vomiting, but vomiting up mucous.  Had glass of wine with slice of pizza this yesterday evening, no alcohol today.  Denies recent heavy alcohol use (hospitalized 12/2013 for intoxication and seizure).  Fasting today other than 1 cup of black coffee this morning.  No numbness, tingling, weakness.  No other aggravating or relieving factors.  No other complaint.   Review of Systems ROS as in subjective      Objective:   BP 138/90  Pulse 88  Temp(Src) 98.5 F (36.9 C) (Oral)  Resp 20  Wt 132 lb (59.875 kg)  SpO2 97%  General appearance: alert, no distress, WD/WN HEENT: normocephalic, sclerae anicteric, TMs pearly, nares patent, no discharge or erythema, pharynx normal Oral cavity: slightly dry mucous membranes, no lesions Neck: supple, no lymphadenopathy, no thyromegaly, no masses Heart: RRR, normal S1, S2, no murmurs Lungs: bronchial breath sounds, +rhonchi, no wheezes,no rales Abdomen: +bs, soft, +hepatomegaly, non tender, non distended, no masses, no splenomegaly Pulses: 2+ symmetric, upper and lower extremities, normal cap refill Ext: no edema Neuro: A&Ox3, nonfocal exam other than unable to do heel to toe given some dizziness.      Assessment: Encounter Diagnoses  Name Primary?  . Dizziness and giddiness Yes  . Cough   . Head congestion   . High risk medication use   . Dehydration   . Hepatomegaly       Plan: Etiology unclear.  Could be sinusitis and dehydration but consider ETOH withdrawal, medication toxicity, pneumonia, other.  Clinically he has hepatomegaly, dizzy, decreased breath sounds/rhonchi, somewhat dehydrated, and mild alcohol odor although he reports last drink last night.   He was hospitalized in 12/2013 for alcohol intoxication and has been noncompliant in the past.  He notes not taking some of his medication the last few days due to no appetite and feeling sick/congested.  Orthostatic vitals +.  I recommended he be seen in the ED for labs/CXR to help eval but he refuses to go.   Thus, we will send for CXR, get STAT labs.  He is wanting an antibiotic and to go home which I told him wasn't appropriate without other eval given his presentation.   I did advised he focus on water intake, rest, not to drive if too dizzy.  Advised he eat something given his fasting status.   F/u pending labs/CXR.  [name] was seen today for head and chest congestion.  Diagnoses and associated orders for this visit:  Dizziness and giddiness - CBC with Differential - Valproic acid level - DG Chest 2 View; Future - Hepatic Function Panel - Basic metabolic panel - Ethanol - Urinalysis Dipstick - Orthostatic vital signs - Cancel: Ammonia  Cough - CBC with Differential - Valproic acid level - DG Chest 2 View; Future - Hepatic Function Panel - Basic metabolic panel - Ethanol - Urinalysis Dipstick -  Orthostatic vital signs - Cancel: Ammonia  Head congestion - CBC with Differential - Valproic acid level - DG Chest 2 View; Future - Hepatic Function Panel - Basic metabolic panel - Ethanol - Urinalysis Dipstick - Orthostatic vital signs - Cancel: Ammonia  High risk medication use - CBC with Differential - Valproic acid level - DG Chest 2 View; Future - Hepatic Function Panel - Basic metabolic panel - Ethanol - Urinalysis Dipstick - Orthostatic vital signs - Cancel:  Ammonia  Dehydration - Cancel: Ammonia  Hepatomegaly - Cancel: Ammonia     Return pending stat labs, CXR.

## 2014-05-27 LAB — ETHANOL: Alcohol, Ethyl (B): 53 mg/dL — ABNORMAL HIGH (ref 0–10)

## 2014-05-29 ENCOUNTER — Telehealth: Payer: Self-pay | Admitting: Family Medicine

## 2014-05-29 NOTE — Telephone Encounter (Signed)
He needs to come in for blood work and office visit.

## 2014-05-29 NOTE — Telephone Encounter (Signed)
Fax refill request from [facility]  Atorvastatin  20 90 day supply

## 2014-05-29 NOTE — Telephone Encounter (Signed)
Pt states he will have to call back to let me know when he is available to come in

## 2014-05-29 NOTE — Telephone Encounter (Signed)
Explain that we have not checked his lipids in quite some time and he can come in at his convenience to get the lipid panel drawn. I cannot call the medicine until he had that done. Find out when he wants to come in and don't let her run out

## 2014-05-29 NOTE — Telephone Encounter (Signed)
Pt states he was just in the office on 05/26/14 and saw Audelia Acton. He states he is not out of his Atorvastatin and dosent feel the need to have another OV or blood work drawn. I explained to pt that the bloodwork that was drawn on Friday is not what you are needing in regards to the medication. Pt states he will CB to make the appt when his medication runs out/ when he is ready.

## 2014-06-01 ENCOUNTER — Telehealth: Payer: Self-pay | Admitting: Internal Medicine

## 2014-06-01 ENCOUNTER — Other Ambulatory Visit: Payer: Self-pay | Admitting: Medical

## 2014-06-01 MED ORDER — LEVOFLOXACIN 500 MG PO TABS: 500.0000 mg | ORAL_TABLET | Freq: Every day | ORAL | Status: DC

## 2014-06-01 NOTE — Telephone Encounter (Signed)
Reported to patient. 

## 2014-06-01 NOTE — Telephone Encounter (Signed)
Pt is still having sinus issues and a really bad sore throat. He took the zpax from Friday- Tuesday and got better then he is back to being sick again. Pt is coming to see Dr. Redmond School on Monday for some lab work but didn't know if he could get some something until then. Send to [facility]

## 2014-06-01 NOTE — Telephone Encounter (Signed)
i sent levaquin instead.  He has used this before.  Have him cut his Lamictal dose in half while on this.  F/u Monday as planned.

## 2014-06-05 ENCOUNTER — Ambulatory Visit: Payer: Medicare Other | Admitting: Family Medicine

## 2014-06-15 ENCOUNTER — Encounter: Payer: Self-pay | Admitting: Family Medicine

## 2014-06-15 ENCOUNTER — Ambulatory Visit (INDEPENDENT_AMBULATORY_CARE_PROVIDER_SITE_OTHER): Payer: Medicare Other | Admitting: Family Medicine

## 2014-06-15 VITALS — BP 130/96 | Temp 98.2°F | Wt 124.0 lb

## 2014-06-15 DIAGNOSIS — E785 Hyperlipidemia, unspecified: Secondary | ICD-10-CM

## 2014-06-15 DIAGNOSIS — J441 Chronic obstructive pulmonary disease with (acute) exacerbation: Secondary | ICD-10-CM

## 2014-06-15 DIAGNOSIS — J0110 Acute frontal sinusitis, unspecified: Secondary | ICD-10-CM

## 2014-06-15 DIAGNOSIS — R634 Abnormal weight loss: Secondary | ICD-10-CM

## 2014-06-15 DIAGNOSIS — F1020 Alcohol dependence, uncomplicated: Secondary | ICD-10-CM

## 2014-06-15 DIAGNOSIS — F17200 Nicotine dependence, unspecified, uncomplicated: Secondary | ICD-10-CM

## 2014-06-15 MED ORDER — AMOXICILLIN-POT CLAVULANATE 875-125 MG PO TABS: 1.0000 | ORAL_TABLET | Freq: Two times a day (BID) | ORAL | Status: DC

## 2014-06-15 NOTE — Patient Instructions (Signed)
Call [facility]. Take the Augmentin but also taking a probiotic

## 2014-06-15 NOTE — Progress Notes (Signed)
   Subjective:    Patient ID: Gregory [name], male    DOB: [birth year], 63 y.o.   MRN: [id number]  HPI  Patient is a 63 y.o. male presenting for ongoing issues with sore throat since the end of May. Patient feels burning sensation at back of his throat, drainage in his throat, runny nose, ear pain, has productive cough with clear mucus, wheezes, diarrhea (since starting Levaquin), frontal sinus pressure. Denies fevers, n/v, abdominal pain, sob, chest pain, mental status changes except when "he's drank too much", urinary changes. He states he stopped drinking 2 days ago. Patient is still smoking 1.5ppd. Has been using his fluticasone-salmeterol inhaler every day at least once a day. Has used albuterol inhaler sparingly about 6 times in June.   Gregory [name] continues to drink heavily, states "too much", would like to speak with Gregory [name] about his medications for alcohol dependence. He has previously been hospitalized for alcohol withdrawal symptoms, most recently in Jan 2015. In 2013, patient saw Gregory [name] regarding his alcohol use and was prescribed 2 medications Campral and Naltrexone. The medications expired Jan 2014 and he admits to not taking them. He continues to have drinking issues with wine. The patient's wife is really frustrated with him since she travels frequently and has difficulty with her husband not eating, "not taking [name] of himself and the house" when she's gone.  Patient would also like to have his lipid panel since his mail order cholesterol medication won't be covered by Medicare unless he has it rechecked.  Review of Systems  ROS as in subjective.    Objective:   Physical Exam  Filed Vitals:   06/15/14 0816  BP: 130/96  Temp: 98.2 F (36.8 C)   BP 130/96  Temp(Src) 98.2 F (36.8 C)  Wt 124 lb (56.246 kg)   General appearence: alert, no distress, pale and cachectic looking  HEENT: normocephalic, sclerae mildly injected bilaterally, TMs pearly, nares patent,  mucous membranes somewhat dry and erythematous, frontal sinus pressure noted, pharynx erythematous with postnasal drip Oral cavity: MMM, no lesions Neck: supple, no lymphadenopathy, no thyromegaly, no masses Heart: RRR, normal S1, S2, no murmurs Lungs: breath sounds coarse throughout, wheezes R>L otherwise no rhonchi, or rales Abdomen: +bs, hepatomegaly, otherwise soft, non tender, non distended, no masses Pulses: 2+ symmetric, upper and lower extremities, normal cap refill     Assessment & Plan:   Encounter Diagnoses  Name Primary?  . Current smoker   . Alcoholic Yes  . Hyperlipidemia with target LDL less than 130   . COPD exacerbation   . Loss of weight   . Acute frontal sinusitis, recurrence not specified     Acute Sinusitis - start Augmentin, take with probiotic for potential GI issues. F/u in 2 weeks. Alcoholism - advised patient to call [facility]. Use AA meetings in combination with rehab. Ethanol level today and cmet, cbc. Also advised his wife to get involved with [employer] Hyperlipidemia - lipid panel today  Patient was seen in conjunction with PA student Gregory [name], and I have also evaluated and examined patient, agree with student's notes, student supervised by me.

## 2014-06-16 LAB — CBC WITH DIFFERENTIAL/PLATELET
Basophils Absolute: 0 10*3/uL (ref 0.0–0.1)
Basophils Relative: 0 % (ref 0–1)
Eosinophils Absolute: 0 10*3/uL (ref 0.0–0.7)
Eosinophils Relative: 0 % (ref 0–5)
HCT: 45.6 % (ref 39.0–52.0)
Hemoglobin: 16.2 g/dL (ref 13.0–17.0)
Lymphocytes Relative: 18 % (ref 12–46)
Lymphs Abs: 0.9 10*3/uL (ref 0.7–4.0)
MCH: 32.7 pg (ref 26.0–34.0)
MCHC: 35.5 g/dL (ref 30.0–36.0)
MCV: 91.9 fL (ref 78.0–100.0)
Monocytes Absolute: 0.8 10*3/uL (ref 0.1–1.0)
Monocytes Relative: 16 % — ABNORMAL HIGH (ref 3–12)
Neutro Abs: 3.2 10*3/uL (ref 1.7–7.7)
Neutrophils Relative %: 66 % (ref 43–77)
Platelets: 139 10*3/uL — ABNORMAL LOW (ref 150–400)
RBC: 4.96 MIL/uL (ref 4.22–5.81)
RDW: 16.3 % — ABNORMAL HIGH (ref 11.5–15.5)
WBC: 4.9 10*3/uL (ref 4.0–10.5)

## 2014-06-16 LAB — COMPREHENSIVE METABOLIC PANEL
ALT: 109 U/L — ABNORMAL HIGH (ref 0–53)
AST: 111 U/L — ABNORMAL HIGH (ref 0–37)
Albumin: 4.3 g/dL (ref 3.5–5.2)
Alkaline Phosphatase: 86 U/L (ref 39–117)
BUN: 12 mg/dL (ref 6–23)
CO2: 28 mEq/L (ref 19–32)
Calcium: 9.5 mg/dL (ref 8.4–10.5)
Chloride: 92 mEq/L — ABNORMAL LOW (ref 96–112)
Creat: 0.71 mg/dL (ref 0.50–1.35)
Glucose, Bld: 95 mg/dL (ref 70–99)
Potassium: 4.3 mEq/L (ref 3.5–5.3)
Sodium: 134 mEq/L — ABNORMAL LOW (ref 135–145)
Total Bilirubin: 1.6 mg/dL — ABNORMAL HIGH (ref 0.2–1.2)
Total Protein: 7.7 g/dL (ref 6.0–8.3)

## 2014-06-16 LAB — ETHANOL: Alcohol, Ethyl (B): <10 mg/dL (ref 0–10)

## 2014-06-16 LAB — LIPID PANEL
Cholesterol: 186 mg/dL (ref 0–200)
HDL: 95 mg/dL (ref >39)
LDL Cholesterol: 70 mg/dL (ref 0–99)
Total CHOL/HDL Ratio: 2 Ratio
Triglycerides: 104 mg/dL (ref <150)
VLDL: 21 mg/dL (ref 0–40)

## 2014-06-26 ENCOUNTER — Encounter (HOSPITAL_COMMUNITY): Payer: Self-pay | Admitting: Psychology

## 2014-06-26 ENCOUNTER — Other Ambulatory Visit (HOSPITAL_COMMUNITY): Payer: No Typology Code available for payment source | Attending: Psychiatry | Admitting: Psychology

## 2014-06-26 DIAGNOSIS — F17200 Nicotine dependence, unspecified, uncomplicated: Secondary | ICD-10-CM | POA: Diagnosis not present

## 2014-06-26 DIAGNOSIS — F4310 Post-traumatic stress disorder, unspecified: Secondary | ICD-10-CM | POA: Insufficient documentation

## 2014-06-26 DIAGNOSIS — F319 Bipolar disorder, unspecified: Secondary | ICD-10-CM | POA: Diagnosis not present

## 2014-06-26 DIAGNOSIS — E785 Hyperlipidemia, unspecified: Secondary | ICD-10-CM | POA: Insufficient documentation

## 2014-06-26 DIAGNOSIS — Z79899 Other long term (current) drug therapy: Secondary | ICD-10-CM | POA: Insufficient documentation

## 2014-06-26 DIAGNOSIS — F1020 Alcohol dependence, uncomplicated: Secondary | ICD-10-CM | POA: Insufficient documentation

## 2014-06-26 DIAGNOSIS — M549 Dorsalgia, unspecified: Secondary | ICD-10-CM | POA: Diagnosis not present

## 2014-06-26 DIAGNOSIS — Z7982 Long term (current) use of aspirin: Secondary | ICD-10-CM | POA: Diagnosis not present

## 2014-06-26 DIAGNOSIS — J4489 Other specified chronic obstructive pulmonary disease: Secondary | ICD-10-CM | POA: Insufficient documentation

## 2014-06-26 DIAGNOSIS — I10 Essential (primary) hypertension: Secondary | ICD-10-CM | POA: Diagnosis not present

## 2014-06-26 DIAGNOSIS — J449 Chronic obstructive pulmonary disease, unspecified: Secondary | ICD-10-CM | POA: Diagnosis not present

## 2014-06-26 DIAGNOSIS — I2510 Atherosclerotic heart disease of native coronary artery without angina pectoris: Secondary | ICD-10-CM | POA: Diagnosis not present

## 2014-06-26 DIAGNOSIS — G4730 Sleep apnea, unspecified: Secondary | ICD-10-CM | POA: Diagnosis not present

## 2014-06-28 ENCOUNTER — Encounter (HOSPITAL_COMMUNITY): Payer: Self-pay | Admitting: Psychology

## 2014-06-28 ENCOUNTER — Other Ambulatory Visit (HOSPITAL_COMMUNITY): Payer: No Typology Code available for payment source | Admitting: Psychology

## 2014-06-28 DIAGNOSIS — F1020 Alcohol dependence, uncomplicated: Secondary | ICD-10-CM | POA: Diagnosis not present

## 2014-06-28 DIAGNOSIS — F319 Bipolar disorder, unspecified: Secondary | ICD-10-CM

## 2014-06-28 DIAGNOSIS — F10220 Alcohol dependence with intoxication, uncomplicated: Secondary | ICD-10-CM

## 2014-06-28 NOTE — Progress Notes (Signed)
    Daily Group Progress Note  Program: CD-IOP   Group Time: 1-2:30 pm  Participation Level: Active  Behavioral Response: Appropriate and Sharing  Type of Therapy: Psycho-education Group  Topic: Psycho-Ed: the first part of group was spent in a psycho-education session. After checking in, members were provided a handout on "The Feeling Chart". It identified the progression of the emotional aspects of addiction. Members shared about their own experiences relative to each of the different stages identified on the handout. Present today were 3 new group members and during the first part of group, one of the new members introduced himself to his new fellow group members.   Group Time: 2:45-4pm  Participation Level: Active  Behavioral Response: Appropriate and Sharing  Type of Therapy: Process Group  Topic: Brain Chemistry/Process: the second part of group was spent in a brief slide show describing the dopamine deficiency that occurs with extensive drug use. The slide show included pictures of dopamine in the brain from Pet Scans taken from a healthy brain and one addicted to cocaine. The scans were very informative and members agreed they were very helpful in understanding why they feel so badly in early recovery. The new members shared more about themselves and personal disclosures were made during this part of group.   Summary: The patient was new to the group and introduced himself and identified a sobriety date of today - 7/6. He was very receptive to the discussion on the handout and agreed that for the past few years he really hasn't experienced any pleasure, but rather he has just been trying to stay level. He shared that he had attained almost 2 years of sobriety starting in 1999. He had attended AA and had picked up a one year chip back then. When asked what happened that he began drinking again, the patient admitted he had decided to celebrate his sobriety with a drink! in the second  half of group, the patient shared about his own life. At my encouraging, he identified a very hurtful experience in childhood and admitted he had a very poor sense of self. The patient reported his grandson was in a hospital in [place]. He was deemed a 'super preemie" after being delivered just 22 weeks into the pregnancy. His wife has spent much of her weekends over in [place] and it has been a very upsetting experience for everyone. The patient admitted he had not anticipated talking much today in group, but had been very talkative and shared openly about his relationship to alcohol. The patient responded well to this first intervention. We will see if he can remain alcohol-free between now and the first group session.    Family Program: Family present? No   Name of family member(s):   UDS collected: Yes Results: positive for alcohol  AA/NA attended?: YesSaturday  Sponsor?: No, but he is new to recovery   Shagun Wordell, LCAS

## 2014-06-29 ENCOUNTER — Ambulatory Visit (INDEPENDENT_AMBULATORY_CARE_PROVIDER_SITE_OTHER): Payer: Medicare Other | Admitting: Family Medicine

## 2014-06-29 ENCOUNTER — Encounter: Payer: Self-pay | Admitting: Family Medicine

## 2014-06-29 ENCOUNTER — Encounter (HOSPITAL_COMMUNITY): Payer: Self-pay | Admitting: Psychology

## 2014-06-29 VITALS — BP 122/70 | HR 74 | Wt 133.0 lb

## 2014-06-29 DIAGNOSIS — J0110 Acute frontal sinusitis, unspecified: Secondary | ICD-10-CM

## 2014-06-29 DIAGNOSIS — F1020 Alcohol dependence, uncomplicated: Secondary | ICD-10-CM
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
## 2014-06-29 NOTE — Patient Instructions (Signed)
Call Dr. [name] concerning continuing the Campral

## 2014-06-29 NOTE — Progress Notes (Signed)
   Subjective:    Patient ID: Gregory [name], male    DOB: [birth year], 63 y.o.   MRN: [id number]  HPI He is here for recheck. He states that he is totally back to normal in regard to his sinus disease. He is now involved in group therapy and going to [employer]. The group therapy meetings are in his medical record. He also would like to continue Campral. He states overall he is doing much better. He has gained weight.   Review of Systems     Objective:   Physical Exam Alert and in no distress. Much more animated in his conversation with me then in the past.       Assessment & Plan:  Alcoholic  Acute frontal sinusitis, recurrence not specified  encoura ged him to continue with both the group therapy and AA. He will discuss continuing Campral with Dr.Kaur since I do not use that medication. I left the door open for him concerning further care.

## 2014-06-30 ENCOUNTER — Other Ambulatory Visit (HOSPITAL_COMMUNITY): Payer: No Typology Code available for payment source | Admitting: Psychology

## 2014-06-30 ENCOUNTER — Telehealth: Payer: Self-pay | Admitting: Internal Medicine

## 2014-06-30 ENCOUNTER — Encounter (HOSPITAL_COMMUNITY): Payer: Self-pay

## 2014-06-30 ENCOUNTER — Other Ambulatory Visit: Payer: Self-pay

## 2014-06-30 DIAGNOSIS — F4310 Post-traumatic stress disorder, unspecified: Secondary | ICD-10-CM

## 2014-06-30 DIAGNOSIS — M48062 Spinal stenosis, lumbar region with neurogenic claudication: Secondary | ICD-10-CM

## 2014-06-30 DIAGNOSIS — F429 Obsessive-compulsive disorder, unspecified: Secondary | ICD-10-CM | POA: Insufficient documentation

## 2014-06-30 DIAGNOSIS — T7692XA Unspecified child maltreatment, suspected, initial encounter: Secondary | ICD-10-CM

## 2014-06-30 DIAGNOSIS — F1994 Other psychoactive substance use, unspecified with psychoactive substance-induced mood disorder: Secondary | ICD-10-CM | POA: Insufficient documentation

## 2014-06-30 DIAGNOSIS — F1020 Alcohol dependence, uncomplicated: Secondary | ICD-10-CM

## 2014-06-30 DIAGNOSIS — T7492XA Unspecified child maltreatment, confirmed, initial encounter: Secondary | ICD-10-CM | POA: Insufficient documentation

## 2014-06-30 DIAGNOSIS — F10220 Alcohol dependence with intoxication, uncomplicated: Secondary | ICD-10-CM

## 2014-06-30 DIAGNOSIS — F19982 Other psychoactive substance use, unspecified with psychoactive substance-induced sleep disorder: Secondary | ICD-10-CM

## 2014-06-30 MED ORDER — ACAMPROSATE CALCIUM 333 MG PO TBEC: DELAYED_RELEASE_TABLET | ORAL | Status: AC

## 2014-06-30 MED ORDER — ATORVASTATIN CALCIUM 20 MG PO TABS: 20.0000 mg | ORAL_TABLET | Freq: Every day | ORAL | Status: DC

## 2014-06-30 NOTE — Telephone Encounter (Signed)
Refill request for atorvastatin 20mg  #90 [facility]

## 2014-06-30 NOTE — Telephone Encounter (Signed)
S[facility] IN [facility] PER REQUEST OF PT

## 2014-06-30 NOTE — Telephone Encounter (Signed)
DONE
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
## 2014-07-03 ENCOUNTER — Encounter (HOSPITAL_COMMUNITY): Payer: Self-pay | Admitting: Psychology

## 2014-07-03 ENCOUNTER — Other Ambulatory Visit (HOSPITAL_COMMUNITY): Payer: No Typology Code available for payment source | Admitting: Psychology

## 2014-07-03 DIAGNOSIS — M48062 Spinal stenosis, lumbar region with neurogenic claudication: Secondary | ICD-10-CM

## 2014-07-03 DIAGNOSIS — F319 Bipolar disorder, unspecified: Secondary | ICD-10-CM

## 2014-07-03 DIAGNOSIS — T7692XA Unspecified child maltreatment, suspected, initial encounter: Secondary | ICD-10-CM

## 2014-07-03 DIAGNOSIS — F1020 Alcohol dependence, uncomplicated: Secondary | ICD-10-CM | POA: Diagnosis not present

## 2014-07-03 NOTE — Progress Notes (Signed)
    Daily Group Progress Note  Program: CD-IOP   Group Time: 1-2:30 pm  Participation Level: Active  Behavioral Response: Appropriate and Sharing  Type of Therapy: Psycho-education Group  Topic: Early Relapse: The four most common causes. The first part of group was spent in a psycho-education presentation. A handout was provided which identified the 4 most common conditions under which relapse occurs. The discussion focused on examples of each of those conditions and what conditions group members were able to relate to. Each member disclosed at least one example of what condition they may have experienced in the past. Plus, how they might handle it now in early recovery and knowing what they now know. During group today, the Market researcher met with two new members for their initial session with him.   Group Time: 2:45- 4pm  Participation Level: Active  Behavioral Response: Sharing  Type of Therapy: Process Group  Topic: Scheduling/Introduction/Process: The second half of group was spent in process. Members shared the weekly schedules they had been asked to complete at the end of the last session. Members schedules varied greatly. Some were asked to consider how they could fill some of the blank spaces. A new member was present and he shared about his life and what had brought him to group. He received good feedback from his new group members and seemed relaxed and open about his issues. Members shared about the upcoming weekend and the AA Cookout on Saturday at [facility] and other events that would be fun and support sobriety.   Summary: The patient reported the most likely condition he might relapse on was 'emotional pain'. He talked about his childhood and the pain he still carries with him. He also reminded the group about the hurtful comments his wife makes when she is drinking. He met with the program director during the half of group. In process, the patient reported he had attended  3 meetings since the last group, secured a sponsor and been given a [place]. He was very pleased with this news. The patient maintained he had not experienced any cravings yet, but another member assured him that he certainly would at some time. He has begun to take Campral, which may reduce any cravings he does have. The patient reported he will pick up his wife at the airport later this evening. When asked about how he will be affected if she drinks, he noted he could call someone, go outside and work in the garden or go for a walk. The patient insisted he would not enable his wife and buy her wine, as he has in the past. The patient made some good comments and provided excellent feedback to his fellow group members. He continues to make good progress in his very early recovery, but this weekend will surely be a challenge for him. The patient's sobriety date remains 7/6.   Family Program: Family present? No   Name of family member(s):   UDS collected: No Results:   AA/NA attended?: YesThursday and Friday  Sponsor?: Yes   Iyanla Eilers, LCAS

## 2014-07-04 ENCOUNTER — Encounter (HOSPITAL_COMMUNITY): Payer: Self-pay | Admitting: Psychology

## 2014-07-04 NOTE — Progress Notes (Signed)
    Daily Group Progress Note  Program: CD-IOP   Group Time: 1-2:30 pm  Participation Level: Active  Behavioral Response: Appropriate and Sharing  Type of Therapy: Process Group  Topic: Process: the first half of group was spent in process. Members shared about the past weekend and actions taken to support their recovery. One member returned after having relapsed a week after his graduation while another member returned after a stint at [facility]. No one had relapsed since the last session and members had been very active over the weekend, including attending numerous 12-step meetings, attending a "AA Rave Party" Saturday night and the AA picnic at [facility] on Sunday. Drug tests were collected from all group members.  Group Time: 2:45- 4pm  Participation Level: Active  Behavioral Response: Sharing  Type of Therapy: Psycho-education Group  Topic: Step One/Mindfulness. The second part of group was spent in a psycho-ed. The session began with a member reading her "Step One". It included the many ways she had proven powerless over her addiction. A brief exercise with 4 group members and me throwing a ball in a circle was the start of a presentation on Mindfulness. A handout was provided and members read it together and discussed this topic. The session ended with members sharing their plans for the next two days prior to the next group session.   Summary: The patient was attentive and engaged in the session. He reported he had a good weekend and his wife had not drunk any alcohol after she returned from work travel on Friday evening. He attended his AA meeting on Saturday and spoke with his sponsor. He and his wife also attended the [place] picnic at [place] on Sunday. He seemed very pleased and noted that as of today he had attained a week of total abstinence. The patient provided good feedback to his fellow group members. In the second half of group, I had to stop him as he stood up and prepared to  give another group member a Kleenex box to help with her tears. I reminded him that we had a tissue issue and encouraged him to not try and rescue other group members. In the psycho-ed, the patient was a participant in the circle with the ball. He agreed that he did not think of anything except trying to catch the ball and keep it going. The patient displayed good insight and responded well to this intervention. He is doing everything we have asked of him and this is just his 4th group session. The patient's sobriety date remains 7/6.   Family Program: Family present? No   Name of family member(s):   UDS collected: Yes Results: not back yet  AA/NA attended?: YesMonday, Friday and Saturday  Sponsor?: Yes   Brittain Hosie, LCAS
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
## 2014-07-04 NOTE — Progress Notes (Signed)
    Daily Group Progress Note  Program: CD-IOP   Group Time: 1-2:30 pm  Participation Level: Active  Behavioral Response: Appropriate and Sharing  Type of Therapy: Process Group  Topic: Process: the first half of group was spent in process. Members shared about the past weekend and actions taken to support their recovery. One member returned after having relapsed a week after his graduation while another member returned after a stint at [facility]. No one had relapsed since the last session and members had been very active over the weekend, including attending numerous 12-step meetings, attending a "AA Rave Party" Saturday night and the AA picnic at [facility] on Sunday. Drug tests were collected from all group members.  Group Time: 2:45- 4pm  Participation Level: Active  Behavioral Response: Sharing  Type of Therapy: Psycho-education Group  Topic: Step One/Mindfulness. The second part of group was spent in a psycho-ed. The session began with a member reading her "Step One". It included the many ways she had proven powerless over her addiction. A brief exercise with 4 group members and myself throwing a ball in a circle was the start of a presentation on Mindfulness. A handout was provided and members read it together and discussed this topic. The session ended with members sharing their plans for the next two days prior to the next group session.   Summary: The patient reportred he was doing well. He pointed out that he had attained one week of sobriety as of today! The group applauded this news. He had gone to the [employer] at [place] and his wife had accompanied him. He had made some appetizers and seen some familiar faces. The patient reported he had attended the men's 9:30 meeting at the [employer] on Saturday and was amazed at the support and kindness shown to him. He was welcoming to the member who had returned after a relapse and validated his efforts to take care of himself and change his  old patterns. The patient was one of the 4 to stand in the middle of group and toss the ball from one to another. He admitted he was focusing because he was afraid he might drop the ball. The patient provided good feedback to his fellow group members and was was welcoming to those who had returned. He is embracing recovery and doing everything we have asked of him. He responded well to this intervention and his sobriety date remains 7/6. today  Family Program: Family present? No   Name of family member(s):   UDS collected: Yes Results:not back  AA/NA attended?: YesMonday, Saturday and Sunday  Sponsor?: Yes   Debbi Strandberg, LCAS

## 2014-07-05 ENCOUNTER — Other Ambulatory Visit (HOSPITAL_COMMUNITY): Payer: No Typology Code available for payment source | Admitting: Psychology

## 2014-07-05 DIAGNOSIS — M48062 Spinal stenosis, lumbar region with neurogenic claudication: Secondary | ICD-10-CM

## 2014-07-05 DIAGNOSIS — F1020 Alcohol dependence, uncomplicated: Secondary | ICD-10-CM | POA: Diagnosis not present

## 2014-07-05 DIAGNOSIS — F319 Bipolar disorder, unspecified: Secondary | ICD-10-CM

## 2014-07-05 DIAGNOSIS — F429 Obsessive-compulsive disorder, unspecified: Secondary | ICD-10-CM

## 2014-07-05 DIAGNOSIS — F10220 Alcohol dependence with intoxication, uncomplicated: Secondary | ICD-10-CM

## 2014-07-06 ENCOUNTER — Encounter (HOSPITAL_COMMUNITY): Payer: Self-pay | Admitting: Psychology

## 2014-07-06 NOTE — Progress Notes (Signed)
    Daily Group Progress Note  Program: CD-IOP   Group Time: 1-2:30 PM  Participation Level: Active  Behavioral Response: Appropriate and Sharing  Type of Therapy: Psycho-education Group  Topic: Changing Thoughts/Attitudes/Behaviors in Early Recovery/New Member: the first half of group was spent in a psycho-ed. Members checked-in with sobriety dates and shared a little about what they had been doing since the last group.  One member shared that she had been reporting the wrong sobriety date just after beginning the program. She didn't like the number 6 and liked the idea of 05/04/14. She admitted that today she was scheduled to graduate, but after disclosing that bit of information to me yesterday, things had changed. A presentation was provided identifying the need to address the dysfunctional and dishonest thinking, attitudes and behaviors that one develops in their active addiction. One may stop using, but the distorted ways of being do not. They must be addressed rigorously and completely. Members identified the characteristics displayed during active addiction and then the contrasted them with the characteristics of sobriety. The remainder of the session was spent discussing the spiritual principle behind Step 1 which is "Honesty". During this session today, the program director met with 2 new group members.   Group Time: 2:45- 4PM  Participation Level: Active  Behavioral Response: Sharing  Type of Therapy: Process Group  Topic: Process/Introduction to New Group Member: the second half of group was spent in process. Members shared about issues and concerns in early recovery. A new group member was present today and she introduced herself and described what her alcoholism had caused her and how she intends to address it going forward. She had just been discharged from [facility]. There was good feedback and disclosure during this part of group. The session ended with members sharing their plans  for the days ahead.   Summary: The patient reported he was doing well and had attended 2 meetings yesterday and would attend 2 more today, in addition to this program, before the end of the day. He talked about the steps and noted that he was beginning Step 3. Another member asked if he meant that after just 9 days of sobriety he was already on Step 3? He confirmed this much. This disclosure sent off a cacophony of comments from group members and this issue was settled quickly. He cannot work any steps without guidance from his sponsor and the Steps are not completed so quickly. In process, the patient reported his wife had been very supportive of his sobriety and it had been surprising to him. The patient made some good comments and was very supportive of his fellow group members. He had baked gluten-cookies for the planned graduation and every one had been very complimentary about his baking. The patient is making good progress overall and has attained 10 days of sobriety. He has done everything we have asked of him and his sobriety date remains 7/6.   Family Program: Family present? No   Name of family member(s):   UDS collected: No Results:   AA/NA attended?: YesMonday, Tuesday and Wednesday  Sponsor?: Yes   Argusta Mcgann, LCAS

## 2014-07-07 ENCOUNTER — Other Ambulatory Visit (HOSPITAL_COMMUNITY): Payer: No Typology Code available for payment source | Admitting: Psychology

## 2014-07-07 DIAGNOSIS — F10220 Alcohol dependence with intoxication, uncomplicated: Secondary | ICD-10-CM

## 2014-07-07 DIAGNOSIS — F4310 Post-traumatic stress disorder, unspecified: Secondary | ICD-10-CM

## 2014-07-07 DIAGNOSIS — F319 Bipolar disorder, unspecified: Secondary | ICD-10-CM

## 2014-07-07 DIAGNOSIS — M48062 Spinal stenosis, lumbar region with neurogenic claudication: Secondary | ICD-10-CM

## 2014-07-07 DIAGNOSIS — F1020 Alcohol dependence, uncomplicated: Secondary | ICD-10-CM | POA: Diagnosis not present

## 2014-07-10 ENCOUNTER — Encounter (HOSPITAL_COMMUNITY): Payer: Self-pay | Admitting: Psychology

## 2014-07-10 ENCOUNTER — Other Ambulatory Visit (HOSPITAL_COMMUNITY): Payer: No Typology Code available for payment source | Admitting: Psychology

## 2014-07-10 DIAGNOSIS — G4733 Obstructive sleep apnea (adult) (pediatric): Secondary | ICD-10-CM

## 2014-07-10 DIAGNOSIS — M48062 Spinal stenosis, lumbar region with neurogenic claudication: Secondary | ICD-10-CM

## 2014-07-10 DIAGNOSIS — F1020 Alcohol dependence, uncomplicated: Secondary | ICD-10-CM | POA: Diagnosis not present

## 2014-07-10 DIAGNOSIS — F10220 Alcohol dependence with intoxication, uncomplicated: Secondary | ICD-10-CM

## 2014-07-10 DIAGNOSIS — F319 Bipolar disorder, unspecified: Secondary | ICD-10-CM
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
## 2014-07-11 ENCOUNTER — Encounter (HOSPITAL_COMMUNITY): Payer: Self-pay | Admitting: Psychology

## 2014-07-11 NOTE — Progress Notes (Signed)
    Daily Group Progress Note  Program: CD-IOP   Group Time: 1-2:30 pm  Participation Level: Active  Behavioral Response: Appropriate and Sharing  Type of Therapy: Process Group  Topic: Group Process: the first part of group was spent in process. Members checked in with their sobriety dates. One member introduced himself with a sobriety date of today. After check-in, the relapse was discussed at length. The member noted he had not attended any meetings from Friday to Sunday. He took a pain pill on Sunday. It was emphasized that this fellow had stopped working his program. The relapse had, indeed, occurred over a very clear time frame. Also present were two new group members who introduced themselves to their new group members. During this session, random drug tests were collected from all group members.   Group Time: 2:45-4 pm Participation Level: Active  Behavioral Response: Sharing  Type of Therapy: Psycho-education Group  Topic: "The Pros & Cons of Active Addiction versus Sobriety". The second half of group was devoted to a psycho-education session. Members were asked to identify the good and bad of active addiction compared to sobriety. Their responses were put on the board. Some answers could be applied to both pro and con categories, i.e., the return of feelings. Despite the challenges and difficulties of remaining drug and alcohol-free in early recovery, there were very few Cons identified under the Sobriety category. Members agreed that the exercise had helped them clarify the importance and benefits of sobriety in their lives. There was good feedback among members, especially one new member who admitted the pros of using far outweigh the pros of halting his use. Despite disagreements, this young man was greeted with respect and encouragement.   Summary: The patient reported he had had a good weekend and attended numerous AA meetings. When asked about his wife, he reported that she  had remained alcohol-free over the weekend once again. Her drinking has been considered a problem and it has been suggested to this patient that she may try to sabotage his sobriety. It was pointed out that he had attained 2 weeks of sobriety and he nodded enthusiastically. The patient displayed good insight in addressing the member who had relapsed over the weekend, stating, " you left your recovery". In the psycho-ed, the patient identified pros of sobriety include improved relationships, emotional availability, and improved health. He also pointed out the monetary savings. He continues to make good progress and is a respected member of the group. The patient's sobriety date remains 7/6.    Family Program: Family present? No   Name of family member(s):   UDS collected: Yes Results: not back yet  AA/NA attended?: YesMonday, Saturday and Sunday  Sponsor?: Yes   Eryanna Regal, LCAS

## 2014-07-12 ENCOUNTER — Other Ambulatory Visit (HOSPITAL_COMMUNITY): Payer: No Typology Code available for payment source | Admitting: Psychology

## 2014-07-12 ENCOUNTER — Other Ambulatory Visit: Payer: Self-pay | Admitting: Medical

## 2014-07-12 ENCOUNTER — Telehealth: Payer: Self-pay | Admitting: Family Medicine

## 2014-07-12 DIAGNOSIS — M48062 Spinal stenosis, lumbar region with neurogenic claudication: Secondary | ICD-10-CM

## 2014-07-12 DIAGNOSIS — F1020 Alcohol dependence, uncomplicated: Secondary | ICD-10-CM | POA: Diagnosis not present

## 2014-07-12 DIAGNOSIS — F10220 Alcohol dependence with intoxication, uncomplicated: Secondary | ICD-10-CM

## 2014-07-12 DIAGNOSIS — F4310 Post-traumatic stress disorder, unspecified: Secondary | ICD-10-CM

## 2014-07-12 DIAGNOSIS — F319 Bipolar disorder, unspecified: Secondary | ICD-10-CM

## 2014-07-12 DIAGNOSIS — F429 Obsessive-compulsive disorder, unspecified: Secondary | ICD-10-CM

## 2014-07-12 MED ORDER — LISINOPRIL-HYDROCHLOROTHIAZIDE 20-12.5 MG PO TABS: 1.0000 | ORAL_TABLET | Freq: Every morning | ORAL | Status: DC

## 2014-07-12 MED ORDER — AMLODIPINE BESYLATE 5 MG PO TABS: 5.0000 mg | ORAL_TABLET | Freq: Every day | ORAL | Status: DC

## 2014-07-12 NOTE — Telephone Encounter (Signed)
Pt called and stated that he needs refills on Amlodipine and linsinopril HTCZ [facility]. This needs to be for at least 90 days. Pt does state he has recently had bllod work.

## 2014-07-13 ENCOUNTER — Encounter (HOSPITAL_COMMUNITY): Payer: Self-pay | Admitting: Psychology

## 2014-07-13 ENCOUNTER — Telehealth (HOSPITAL_COMMUNITY): Payer: Self-pay | Admitting: Psychology

## 2014-07-13 NOTE — Progress Notes (Signed)
    Daily Group Progress Note  Program: CD-IOP   Group Time: 1-2:30 pm  Participation Level: Active  Behavioral Response: Appropriate and Sharing  Type of Therapy: Psycho-education group  Topic: Check-In/Guest Speaker: the first part of group was spent in a brief check-in followed by a guest speaker. Members checked-in with their sobriety dates and any issues and concerns they were dealing with. Also participating today was a Agricultural consultant. The guest was a former group member who had recently attained almost 8 months of sobriety. He shared his story, but most importantly, the guest shared what he has done since he got sober, including emphasizing the importance of a daily routine, attending 12-step meetings and just "showing up".  There were good comments and feedback among the group. During this session today, the program director, CK, met with new group members.   Group Time: 2:45- 4PM  Participation Level: Active  Behavioral Response: Sharing  Type of Therapy: Process Group  Topic: Process/Gratitude: the second half of group was spent in process. Members disclosed problems and issues they have been dealing with and any struggles or obstacles to ongoing sobriety. Also included in this part of group today was a handout on "Gratitude". Members were provided a handout and after taking a few minutes to complete, they shared some of the things they were grateful for. As the session ended, members shared what they intend to do between now and the next group session to support their recovery.   Summary: The patient was attentive and shared openly in group today. He complimented the guest speaker for his accomplishments, but they already knew each other from the 10 am AA meeting at the [facility]. In process, the patient reported his wife is on a 3-day business trip so he is able to experience a lot of serenity and peace at home. The patient reported he had attended at least 4 meetings  since the last group session and has spoken to his sponsor daily. This patient continues to make excellent progress and he is doing everything we have asked of him. His sobriety date remains 7/6.   Family Program: Family present? No   Name of family member(s):   UDS collected: No Results:   AA/NA attended?: Yes, Monday, Tuesday and Wednesday  Sponsor?: Yes   Alorah Mcree, LCAS

## 2014-07-14 ENCOUNTER — Other Ambulatory Visit (HOSPITAL_COMMUNITY): Payer: No Typology Code available for payment source | Admitting: Psychology

## 2014-07-14 DIAGNOSIS — T7692XA Unspecified child maltreatment, suspected, initial encounter: Secondary | ICD-10-CM

## 2014-07-14 DIAGNOSIS — F1020 Alcohol dependence, uncomplicated: Secondary | ICD-10-CM | POA: Diagnosis not present

## 2014-07-14 DIAGNOSIS — F319 Bipolar disorder, unspecified: Secondary | ICD-10-CM

## 2014-07-14 DIAGNOSIS — M48062 Spinal stenosis, lumbar region with neurogenic claudication: Secondary | ICD-10-CM

## 2014-07-14 DIAGNOSIS — F10220 Alcohol dependence with intoxication, uncomplicated: Secondary | ICD-10-CM

## 2014-07-17 ENCOUNTER — Encounter (HOSPITAL_COMMUNITY): Payer: Self-pay | Admitting: Psychology

## 2014-07-17 ENCOUNTER — Other Ambulatory Visit (HOSPITAL_COMMUNITY): Payer: No Typology Code available for payment source | Admitting: Psychology

## 2014-07-17 DIAGNOSIS — F10220 Alcohol dependence with intoxication, uncomplicated: Secondary | ICD-10-CM

## 2014-07-17 DIAGNOSIS — F4310 Post-traumatic stress disorder, unspecified: Secondary | ICD-10-CM

## 2014-07-17 DIAGNOSIS — M48062 Spinal stenosis, lumbar region with neurogenic claudication: Secondary | ICD-10-CM

## 2014-07-17 DIAGNOSIS — F319 Bipolar disorder, unspecified: Secondary | ICD-10-CM

## 2014-07-17 DIAGNOSIS — F1020 Alcohol dependence, uncomplicated: Secondary | ICD-10-CM | POA: Diagnosis not present

## 2014-07-17 NOTE — Progress Notes (Signed)
    Daily Group Progress Note  Program: CD-IOP   Group Time: 1-2:30 pm  Participation Level: Active  Behavioral Response: Appropriate and Sharing  Type of Therapy: Process Group  Topic: Process: The first part of group was spent in process. Members shared about what they have done since the last group to support their recovery. The group provided feedback and support for the challenges and issues identified by members. During this session, the medical director was meeting with new group members, following up with current members and meeting with a patient discharging from the program today.  Group Time: 2:45-4pm  Participation Level: Active  Behavioral Response: Appropriate and Sharing  Type of Therapy: Psycho-education Group  Topic: Psycho-Ed/Graduation: the second part of group was spent in a psycho-education session on Heart Math. Members volunteered red to 'hook up' to the computer and trace their progress in this stress reduction practice. The group seemed to enjoy watching each other practice calming themselves. The last 3 members were able to achieve total coherence. As the session neared the end, a graduation ceremony was held honoring a member who was completing the program today. The medallion was passed around and members wished him well. The departing member encouraged his fellow group members to keep focused and assured them he would see them in meetings.   Summary: The patient reported he had been struggling with back pain and had to cancel his appointment with me yesterday. He admitted he had to crawl in from the garden yesterday morning because his back went out. His wife is coming home from an out-of-state work trip early tomorrow morning - 1:50 am - and he planned on attending the 6 pm meeting tonight and the men's meeting at [facility] meeting at 9:30 am. He enjoys the morning meeting and at least 3 of the men in this group attend that meeting regularly. When another  member questioned whether the Campral medication was helping her, this patient reported he is trying to take it as prescribed and he feels it has been very helpful for him in reducing alcohol cravings. The patient was attentive, but did not volunteer for the Heart Math. During the graduation ceremony, he shared good thoughts about the graduating member and agreed they would see each other in [place] meetings in the days ahead. The patient continues to make good progress and his sobriety date remains 7/9.     Family Program: Family present? No   Name of family member(s):   UDS collected: No Results:  AA/NA attended?: YesWednesday, Thursday and Friday  Sponsor?: Yes   Adrienne Delay, LCAS

## 2014-07-18 ENCOUNTER — Encounter (HOSPITAL_COMMUNITY): Payer: Self-pay | Admitting: Psychology
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
## 2014-07-19 ENCOUNTER — Other Ambulatory Visit (HOSPITAL_COMMUNITY): Payer: No Typology Code available for payment source | Admitting: Psychology

## 2014-07-19 DIAGNOSIS — F319 Bipolar disorder, unspecified: Secondary | ICD-10-CM

## 2014-07-19 DIAGNOSIS — M48062 Spinal stenosis, lumbar region with neurogenic claudication: Secondary | ICD-10-CM

## 2014-07-19 DIAGNOSIS — F10220 Alcohol dependence with intoxication, uncomplicated: Secondary | ICD-10-CM

## 2014-07-19 DIAGNOSIS — F1020 Alcohol dependence, uncomplicated: Secondary | ICD-10-CM | POA: Diagnosis not present

## 2014-07-19 DIAGNOSIS — F4310 Post-traumatic stress disorder, unspecified: Secondary | ICD-10-CM

## 2014-07-20 ENCOUNTER — Encounter (HOSPITAL_COMMUNITY): Payer: Self-pay | Admitting: Psychology

## 2014-07-20 NOTE — Progress Notes (Signed)
    Daily Group Progress Note  Program: CD-IOP   Group Time: 1-2:30 pm  Participation Level: Active  Behavioral Response: Appropriate and Sharing  Type of Therapy: Process Group  Topic: Process/Dry Drunk: the first part of the session today was spent in process and a psycho-ed. Members shared about what they have done since the last group session.  One member checked-in with a new sobriety date and we processed his relapse. The emphasis was not on shaming this member, but providing everyone with further insight into red flags signaling possible relapse. During group today, the Investment banker, operational met with 2 new group members for their initial sessions with him and a discharge. A handout defining a "Dry Drunk" was provided and members read over the handout together. At the conclusion of this part of group, everyone agreed they understood what a dry drunk was.   Group Time: 2:45- 4pm  Participation Level: Active  Behavioral Response: Sharing  Type of Therapy: Activity Group/Graduation  Topic: Five Love Languages/Graduation: the second half of group was spent in an activity. Members completed a handout and later offered their ranking relative to the well-known book, The Five Love Languages. The importance of identifying and knowing how one most feels loved was emphasized. This is also true for an S/O or close relationships. None of the members had ever done the exercise and they all seemed to find it enlightening. At the conclusion of group a graduation ceremony was held for a member leaving the program today. Kind words of hope and gratitude were shared and the graduating member encouraged them to make the most of the opportunity this program provides.   Summary: The patient reported he was doing well. He had attended 3 meetings since the last group session. He provided good insight as the group processed the relapse over the weekend of his fellow group member. The patient articulated his  understanding of the term, dry drunk, very clearly. In the second half of group, the patient identified words of affirmation as important as well as little tokens demonstrating love and appreciation. He explained how he would leave little notes ans surprises in his children's lunch boxes and even how today, he leaves surprised for his wife when she leaves to travel on business trips. Tragically, though, I pointed out how he does not get those sorts of things that he most treasures, from those he gives them to. He agreed that his wife doesn't provide many words of affirmation - in fact she is extremely critical - and how she rarely even acknowledges his efforts and surprises. The patient shared kind words of hope and wishes for the future to the graduating member. He continues to make good progress in his recovery and his sobriety date remains 7/6.    Family Program: Family present? No   Name of family member(s):   UDS collected: Yes Results:not back from lab  AA/NA attended?: YesTuesday and Wednesday  Sponsor?: Yes   Finesse Fielder, LCAS

## 2014-07-21 ENCOUNTER — Other Ambulatory Visit (HOSPITAL_COMMUNITY): Payer: No Typology Code available for payment source | Admitting: Psychology

## 2014-07-21 DIAGNOSIS — M48062 Spinal stenosis, lumbar region with neurogenic claudication: Secondary | ICD-10-CM

## 2014-07-21 DIAGNOSIS — F10220 Alcohol dependence with intoxication, uncomplicated: Secondary | ICD-10-CM

## 2014-07-21 DIAGNOSIS — F4310 Post-traumatic stress disorder, unspecified: Secondary | ICD-10-CM

## 2014-07-21 DIAGNOSIS — F1020 Alcohol dependence, uncomplicated: Secondary | ICD-10-CM | POA: Diagnosis not present

## 2014-07-21 DIAGNOSIS — F319 Bipolar disorder, unspecified: Secondary | ICD-10-CM

## 2014-07-24 ENCOUNTER — Other Ambulatory Visit (HOSPITAL_COMMUNITY): Payer: No Typology Code available for payment source | Attending: Psychiatry

## 2014-07-24 ENCOUNTER — Encounter (HOSPITAL_COMMUNITY): Payer: Self-pay | Admitting: Psychology

## 2014-07-24 DIAGNOSIS — M549 Dorsalgia, unspecified: Secondary | ICD-10-CM | POA: Insufficient documentation

## 2014-07-24 DIAGNOSIS — E785 Hyperlipidemia, unspecified: Secondary | ICD-10-CM | POA: Insufficient documentation

## 2014-07-24 DIAGNOSIS — G4730 Sleep apnea, unspecified: Secondary | ICD-10-CM | POA: Insufficient documentation

## 2014-07-24 DIAGNOSIS — F1020 Alcohol dependence, uncomplicated: Secondary | ICD-10-CM | POA: Insufficient documentation

## 2014-07-24 DIAGNOSIS — F17200 Nicotine dependence, unspecified, uncomplicated: Secondary | ICD-10-CM | POA: Insufficient documentation

## 2014-07-24 DIAGNOSIS — J4489 Other specified chronic obstructive pulmonary disease: Secondary | ICD-10-CM | POA: Insufficient documentation

## 2014-07-24 DIAGNOSIS — F319 Bipolar disorder, unspecified: Secondary | ICD-10-CM | POA: Insufficient documentation

## 2014-07-24 DIAGNOSIS — J449 Chronic obstructive pulmonary disease, unspecified: Secondary | ICD-10-CM | POA: Insufficient documentation

## 2014-07-24 DIAGNOSIS — Z7982 Long term (current) use of aspirin: Secondary | ICD-10-CM | POA: Insufficient documentation

## 2014-07-24 DIAGNOSIS — F4310 Post-traumatic stress disorder, unspecified: Secondary | ICD-10-CM | POA: Insufficient documentation

## 2014-07-24 DIAGNOSIS — I10 Essential (primary) hypertension: Secondary | ICD-10-CM | POA: Insufficient documentation

## 2014-07-24 DIAGNOSIS — I2510 Atherosclerotic heart disease of native coronary artery without angina pectoris: Secondary | ICD-10-CM | POA: Insufficient documentation

## 2014-07-24 DIAGNOSIS — Z79899 Other long term (current) drug therapy: Secondary | ICD-10-CM | POA: Insufficient documentation
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
## 2014-07-26 ENCOUNTER — Other Ambulatory Visit (HOSPITAL_COMMUNITY): Payer: No Typology Code available for payment source

## 2014-07-28 ENCOUNTER — Other Ambulatory Visit (HOSPITAL_COMMUNITY): Payer: No Typology Code available for payment source

## 2014-07-31 ENCOUNTER — Other Ambulatory Visit (HOSPITAL_COMMUNITY): Payer: No Typology Code available for payment source | Admitting: Psychology

## 2014-07-31 DIAGNOSIS — I2510 Atherosclerotic heart disease of native coronary artery without angina pectoris: Secondary | ICD-10-CM | POA: Diagnosis not present

## 2014-07-31 DIAGNOSIS — Z79899 Other long term (current) drug therapy: Secondary | ICD-10-CM | POA: Diagnosis not present

## 2014-07-31 DIAGNOSIS — F319 Bipolar disorder, unspecified: Secondary | ICD-10-CM | POA: Diagnosis not present

## 2014-07-31 DIAGNOSIS — M549 Dorsalgia, unspecified: Secondary | ICD-10-CM | POA: Diagnosis not present

## 2014-07-31 DIAGNOSIS — F4310 Post-traumatic stress disorder, unspecified: Secondary | ICD-10-CM | POA: Diagnosis not present

## 2014-07-31 DIAGNOSIS — I10 Essential (primary) hypertension: Secondary | ICD-10-CM | POA: Diagnosis not present

## 2014-07-31 DIAGNOSIS — M48062 Spinal stenosis, lumbar region with neurogenic claudication: Secondary | ICD-10-CM

## 2014-07-31 DIAGNOSIS — F17200 Nicotine dependence, unspecified, uncomplicated: Secondary | ICD-10-CM | POA: Diagnosis not present

## 2014-07-31 DIAGNOSIS — F10220 Alcohol dependence with intoxication, uncomplicated: Secondary | ICD-10-CM

## 2014-07-31 DIAGNOSIS — Z7982 Long term (current) use of aspirin: Secondary | ICD-10-CM | POA: Diagnosis not present

## 2014-07-31 DIAGNOSIS — F1020 Alcohol dependence, uncomplicated: Secondary | ICD-10-CM | POA: Diagnosis not present

## 2014-07-31 DIAGNOSIS — G4730 Sleep apnea, unspecified: Secondary | ICD-10-CM | POA: Diagnosis not present

## 2014-07-31 DIAGNOSIS — J449 Chronic obstructive pulmonary disease, unspecified: Secondary | ICD-10-CM | POA: Diagnosis not present

## 2014-07-31 DIAGNOSIS — E785 Hyperlipidemia, unspecified: Secondary | ICD-10-CM | POA: Diagnosis not present

## 2014-08-01 ENCOUNTER — Encounter (HOSPITAL_COMMUNITY): Payer: Self-pay | Admitting: Psychology
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
## 2014-08-02 ENCOUNTER — Other Ambulatory Visit (HOSPITAL_COMMUNITY): Payer: No Typology Code available for payment source | Admitting: Psychology

## 2014-08-02 DIAGNOSIS — F319 Bipolar disorder, unspecified: Secondary | ICD-10-CM

## 2014-08-02 DIAGNOSIS — F4310 Post-traumatic stress disorder, unspecified: Secondary | ICD-10-CM

## 2014-08-02 DIAGNOSIS — F1020 Alcohol dependence, uncomplicated: Secondary | ICD-10-CM | POA: Diagnosis not present

## 2014-08-02 DIAGNOSIS — M48062 Spinal stenosis, lumbar region with neurogenic claudication: Secondary | ICD-10-CM

## 2014-08-02 DIAGNOSIS — F10220 Alcohol dependence with intoxication, uncomplicated: Secondary | ICD-10-CM

## 2014-08-04 ENCOUNTER — Other Ambulatory Visit (HOSPITAL_COMMUNITY): Payer: No Typology Code available for payment source | Admitting: Psychology

## 2014-08-07 ENCOUNTER — Other Ambulatory Visit (HOSPITAL_COMMUNITY): Payer: No Typology Code available for payment source

## 2014-08-08 ENCOUNTER — Encounter (HOSPITAL_COMMUNITY): Payer: Self-pay | Admitting: Psychology

## 2014-08-08 NOTE — Progress Notes (Signed)
    Daily Group Progress Note  Program: CD-IOP   Group Time: 1-2:30 pm  Participation Level: Active  Behavioral Response: Appropriate and Sharing  Type of Therapy: Psycho-education Group  Topic: Chaplain: the first half of group was spent in a psycho-ed with a guest speaker. PW, a Chaplain with [employer] appeared and led a discussion on "Spirituality". The emphasis was on being present, acceptance and forgiveness. Members shared about their experiences and beliefs. When PW asked the group what they needed, a number of members agreed that they weren't sure that 'answers' would provide them with what they really needed. The session generated even more questions then before the session. During group today, the medical director, CK, met with 2 new group members and provided refills where needed.   Group Time: 2:45- 4pm  Participation Level: Active  Behavioral Response: Sharing  Type of Therapy: Process Group  Topic: Process: the second half of group was spent in process. Members shared about the past few days and any concerns or issues that may have challenged or questioned their sobriety. No one had relapsed since the last session. One member had not appeared yesterday for our individual appointment and I questioned why she had called and cancelled. She admitted her boyfriend had told her not to go. The group responded with disbelief. The importance of making choices and decisions in early recovery was emphasized. I also reminded members that if they didn't make the choices that were needed, others would make them for them. I disclosed that a guest facilitator would be present for the next two sessions and encouraged the group to engage as they always do.   Summary: The patient reported he had attended the meeting this morning at 10 am and had felt very welcomed and happy to be with his 'family'. He shared that his wife had gone to the store and bought wine yesterday evening. She had gone  from being a fairly decent person to being mean-spirited and hurtful. He noted how 'irrational' she was when drinking. In the discussion with the chaplain, the patient reported he had been raised in a Jewish household and taught to memorize prayers in Hebrew as well as songs. He pointed out that he was applauded for memorizing them, but never knew what they meant.and that didn't seem to matter to anyone. In process, the patient provided feedback to another group member. She was feeling a lot of pain because her children were angry with her for her drinking. He noted that he had picked up his 30 day chip and had received phone calls from his two daughters. He cried as he recounted how they had told him, "I am proud of you, Dad". These were wonderful words to him. The patient s making good progress in his recovery and is very engaged in the Fellowship. He responded well to this intervention and his sobriety date remains 7/6.  Family Program: Family present? No   Name of family member(s):   UDS collected: No Results:  AA/NA attended?: YesMonday, Tuesday and Wednesday  Sponsor?: Yes   Raudel Bazen, LCAS

## 2014-08-09 ENCOUNTER — Other Ambulatory Visit (HOSPITAL_COMMUNITY): Payer: No Typology Code available for payment source | Admitting: Psychology

## 2014-08-09 DIAGNOSIS — F10230 Alcohol dependence with withdrawal, uncomplicated: Secondary | ICD-10-CM

## 2014-08-09 DIAGNOSIS — F1020 Alcohol dependence, uncomplicated: Secondary | ICD-10-CM | POA: Diagnosis not present

## 2014-08-09 DIAGNOSIS — F319 Bipolar disorder, unspecified: Secondary | ICD-10-CM

## 2014-08-09 DIAGNOSIS — M48062 Spinal stenosis, lumbar region with neurogenic claudication: Secondary | ICD-10-CM

## 2014-08-10 ENCOUNTER — Encounter (HOSPITAL_COMMUNITY): Payer: Self-pay | Admitting: Psychology

## 2014-08-10 DIAGNOSIS — F1021 Alcohol dependence, in remission: Secondary | ICD-10-CM | POA: Insufficient documentation
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
## 2014-08-10 NOTE — Progress Notes (Unsigned)
[name] CD-IOP: Treatment Plan Update. Met with the patient this afternoon for his individual session. I explained the need to review and update his goals for treatment. We discussed his identified goals of treatment and I commended him for such excellent progress in his primary goal of ongoing sobriety. The patient picked up his 30 day chip and has remained alcohol-free since 7/6. Regarding his second goal, he has also made excellent progress in building a strong network of support for his recovery. The patient began attending AA meetings very quickly after entering the program and averages between 7-12 AA meetings per week. He has secured a sponsor, who earlier this week agreed to go from temporary to long term sponsor. The patient also has a home group at 10 am on weekdays at the [facility] and has established many good relationships with others in recovery. In reviewing his 3rd goal of treatment, he has displayed good effort in becoming more 'Assertive' in his communication. I have witnessed the patient practice assertive communication in group sessions and in his reports about conversations with his wife, he is clearly being assertive and establishing good strong boundaries around his sobriety. Historically, the patient has been very passive and recognizes that isn't going to work any longer, if it every did. The treatment plan update was reviewed, signed and completed accordingly. The patient is doing everything we have asked of him here in the program and he continues to make excellent progress. We will continue to follow closely in the days and weeks ahead.         Falyn Rubel, LCAS

## 2014-08-11 ENCOUNTER — Other Ambulatory Visit (HOSPITAL_COMMUNITY): Payer: No Typology Code available for payment source | Admitting: Psychology

## 2014-08-11 DIAGNOSIS — F319 Bipolar disorder, unspecified: Secondary | ICD-10-CM

## 2014-08-11 DIAGNOSIS — F1020 Alcohol dependence, uncomplicated: Secondary | ICD-10-CM | POA: Diagnosis not present

## 2014-08-11 DIAGNOSIS — F4310 Post-traumatic stress disorder, unspecified: Secondary | ICD-10-CM

## 2014-08-11 DIAGNOSIS — M48062 Spinal stenosis, lumbar region with neurogenic claudication: Secondary | ICD-10-CM

## 2014-08-11 DIAGNOSIS — F10230 Alcohol dependence with withdrawal, uncomplicated: Secondary | ICD-10-CM

## 2014-08-14 ENCOUNTER — Other Ambulatory Visit (HOSPITAL_COMMUNITY): Payer: No Typology Code available for payment source | Admitting: Psychology

## 2014-08-14 ENCOUNTER — Encounter (HOSPITAL_COMMUNITY): Payer: Self-pay | Admitting: Psychology

## 2014-08-14 DIAGNOSIS — F319 Bipolar disorder, unspecified: Secondary | ICD-10-CM

## 2014-08-14 DIAGNOSIS — M48062 Spinal stenosis, lumbar region with neurogenic claudication: Secondary | ICD-10-CM

## 2014-08-14 DIAGNOSIS — F10230 Alcohol dependence with withdrawal, uncomplicated: Secondary | ICD-10-CM

## 2014-08-14 NOTE — Progress Notes (Signed)
    Daily Group Progress Note  Program: CD-IOP   Group Time: 1-2:30 pm  Participation Level: Active  Behavioral Response: Sharing  Type of Therapy: Process Group  Topic: Group Process: the first part of group was spent in process. Members shared about current issues and concerns in early recovery. One member had missed the last group session and disclosed that she had relapsed Wednesday. She cried as she recounted this and admitted she felt as if she had let the group down. Members  assured her that this was not the case. The relapse was diagrammed on the board with feedback from members about what could have been done differently. The session proved very informative.  A new group member was present and she introduced herself  during this part of group. During the group session, the program director was meeting with new group members.   Group Time: 2:45- 4pm  Participation Level: Active  Behavioral Response: Sharing  Type of Therapy: Psycho-education Group  Topic: Family Sculpture: the second part of group was spent in a psycho-ed. Members were asked to 'sculpt' their biological family. An explanation was provided and 3 family sculptures were completed. The session proved emotional for some of those members and provided good insight and more understanding about group members'  lives and experiences.   Summary: The patient reported he was doing well. He had attended the 10 am meeting today and continues to benefit from the Fellowship. He noted he had asked his wife about coming to a group or individual session and she had agreed that she would come. He admitted he was surprised she had agreed to his request. The patient provided good feedback to the member who had relapsed and urged her to be gentle and forgive herself. In the second half of group, the patient made good comments on the sculptures. He shared what he observed and the feelings he had from those observations. The patient  responded well to this session and continues to make good progress in his recovery. His sobriety date remains 7/6.   Family Program: Family present? No   Name of family member(s):   UDS collected: No Results:   AA/NA attended?: YesThursday and Friday  Sponsor?: Yes   Argelia Formisano, LCAS

## 2014-08-16 ENCOUNTER — Other Ambulatory Visit (HOSPITAL_COMMUNITY): Payer: No Typology Code available for payment source | Admitting: Psychology

## 2014-08-16 DIAGNOSIS — F319 Bipolar disorder, unspecified: Secondary | ICD-10-CM

## 2014-08-16 DIAGNOSIS — F10230 Alcohol dependence with withdrawal, uncomplicated: Secondary | ICD-10-CM

## 2014-08-16 DIAGNOSIS — M48062 Spinal stenosis, lumbar region with neurogenic claudication: Secondary | ICD-10-CM

## 2014-08-18 ENCOUNTER — Other Ambulatory Visit (HOSPITAL_COMMUNITY): Payer: No Typology Code available for payment source | Admitting: Psychology

## 2014-08-18 ENCOUNTER — Encounter (HOSPITAL_COMMUNITY): Payer: Self-pay | Admitting: Psychology

## 2014-08-18 DIAGNOSIS — M48062 Spinal stenosis, lumbar region with neurogenic claudication: Secondary | ICD-10-CM

## 2014-08-18 DIAGNOSIS — F10230 Alcohol dependence with withdrawal, uncomplicated: Secondary | ICD-10-CM

## 2014-08-18 DIAGNOSIS — F319 Bipolar disorder, unspecified: Secondary | ICD-10-CM
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
## 2014-08-20 ENCOUNTER — Encounter (HOSPITAL_COMMUNITY): Payer: Self-pay | Admitting: Psychology

## 2014-08-20 NOTE — Progress Notes (Signed)
    Daily Group Progress Note  Program: CD-IOP   Group Time: 1-2:30 pm  Participation Level: Active  Behavioral Response: Sharing  Type of Therapy: Process Group  Topic: Group process: the first half of group was spent in process. Members shared about current issues and concerns in early recovery. There were two new group members present today. The medical director was meeting with group members today and he asked to speak with them during this session.  There were good comments and feedback during the half of group. Two drug tests were collected during this session  Group Time: 2:45-4 pm  Participation Level: Active  Behavioral Response: Appropriate and Sharing  Type of Therapy: Psycho-education Group  Topic: Psycho-Ed: the second half of group was spent in a session on Family Sculpture. Group members picked other members to represent their families and they placed them in positions that represented a theme of their early life. Once again, as in the previous two sessions, the sculpture proved very powerful. There were strong emotions, including pain and hurt, witnessed by all during the sculpture. During this half of group, one of the new group members introduced herself and told her story. It proved very compelling and the new member displayed a depth of understanding that motivated those witnessing her story. The session proved very powerful.   Summary: The patient reported he was doing well. He reported he was still thinking about his family sculpture. Other members reminded him that he validated his life experiences and they appreciated his willingness to share about his childhood. The patient stated that he was grateful for the support. In the psycho-ed, the patient made good comments and offered very insightful feedback to the member who sculpted her family. The patient continues to make excellent progress and his sobriety date remains 7/6.   Family Program: Family present? No    Name of family member(s):   UDS collected: No Results:   AA/NA attended?: YesTuesday and Wednesday  Sponsor?: Yes   Jahmar Mckelvy, LCAS

## 2014-08-21 ENCOUNTER — Other Ambulatory Visit (HOSPITAL_COMMUNITY): Payer: No Typology Code available for payment source | Admitting: Psychology

## 2014-08-21 ENCOUNTER — Other Ambulatory Visit: Payer: Self-pay

## 2014-08-21 DIAGNOSIS — M48062 Spinal stenosis, lumbar region with neurogenic claudication: Secondary | ICD-10-CM

## 2014-08-21 DIAGNOSIS — T7692XA Unspecified child maltreatment, suspected, initial encounter: Secondary | ICD-10-CM

## 2014-08-21 DIAGNOSIS — F319 Bipolar disorder, unspecified: Secondary | ICD-10-CM

## 2014-08-21 DIAGNOSIS — F10230 Alcohol dependence with withdrawal, uncomplicated: Secondary | ICD-10-CM

## 2014-08-21 MED ORDER — LISINOPRIL-HYDROCHLOROTHIAZIDE 20-12.5 MG PO TABS: 1.0000 | ORAL_TABLET | Freq: Every morning | ORAL | Status: DC

## 2014-08-21 NOTE — Telephone Encounter (Signed)
Sent in refill on b/p meds from fax [facility]

## 2014-08-23 ENCOUNTER — Encounter (HOSPITAL_COMMUNITY): Payer: Self-pay | Admitting: Psychology

## 2014-08-23 ENCOUNTER — Other Ambulatory Visit (HOSPITAL_COMMUNITY): Payer: No Typology Code available for payment source | Attending: Psychiatry | Admitting: Psychology

## 2014-08-23 DIAGNOSIS — F319 Bipolar disorder, unspecified: Secondary | ICD-10-CM

## 2014-08-23 DIAGNOSIS — M48062 Spinal stenosis, lumbar region with neurogenic claudication: Secondary | ICD-10-CM

## 2014-08-23 DIAGNOSIS — F10230 Alcohol dependence with withdrawal, uncomplicated: Secondary | ICD-10-CM

## 2014-08-23 NOTE — Progress Notes (Signed)
    Daily Group Progress Note  Program: CD-IOP   Group Time: 1-2:30 pm  Participation Level: Active  Behavioral Response: Appropriate and Sharing  Type of Therapy: Process Group  Topic: Process:  The first portion of group was spent processing what had happened since the last meeting.  Group members shared about their weekend and recovery related activities and events that they engaged in.  A new group member was present and took time to introduce herself to the rest of the group.  The session was lively and each member engaged appropriately.  Group Time: 2:45- 4pm  Participation Level: Active  Behavioral Response: Sharing  Type of Therapy: Psycho-education Group  Topic: Psychoeducation:  The second half of group was spent discussing expectations of group therapy.  Two handouts were provided during this time.  The first outlined group commitments and appropriate behaviors, while the second emphasized the importance of group members' willingness to be open and receive feedback.  The session was intense and some members expressed concerns about their ability to hold others accountable.  No relapses were reported in group today.  Summary:  The patient reported a good weekend.  He attended a men's AA meeting on Saturday morning and he and his wife went to church on Sunday.  The patient reported that he has spoken to his sponsor daily.  He stated that his wife drank heavily on both Friday and Saturday nights, but continued to insist that her drinking does not affect him.  The patient also told the group that he returned his black resentment chip at his 10 a.m. meeting earlier in the day.  He stated that doing the family sculpture activity in group last week facilitated his letting go of some things he had been holding onto.  In the psychoeducation portion of the group, the patient displayed reluctance to confront another member directly and sought to do it instead through the counselor.   Despite this passivity, he continues to make good progress in treatment and in his recovery.  He attends 6-10 AA meetings per week, has a sponsor, and is working the steps.  Interventions have proven effective and the patient's sobriety date remains 7/6.   Family Program: Family present? No   Name of family member(s):   UDS collected: No Results:   AA/NA attended?: YesFriday and Saturday  Sponsor?: Yes   Orvell Careaga, LCAS

## 2014-08-24 ENCOUNTER — Other Ambulatory Visit: Payer: Self-pay

## 2014-08-24 ENCOUNTER — Telehealth: Payer: Self-pay | Admitting: Family Medicine

## 2014-08-24 ENCOUNTER — Encounter (HOSPITAL_COMMUNITY): Payer: Self-pay | Admitting: Psychology

## 2014-08-24 NOTE — Progress Notes (Signed)
    Daily Group Progress Note  Program: CD-IOP   Group Time: 1-2:30 pm  Participation Level: Active  Behavioral Response: Appropriate and Sharing  Type of Therapy: Process Group/Activity  Topic: Group Process/Progressive Relaxation Activity: the first part of group was spent in process. Members shared about current issues and concerns in early recovery. One member became resistant and irritated when challenged about lying late last week. Another member sided with her and reported he thought it was 'ridiculous' to be discussing these behaviors. Other group members appeared somewhat uncomfortable with the interactions, but encouraged the member to review her past behaviors more objectively. A guided progressive relaxation exercise was read with members closing their eyes and following the words. Most of the group members later reported enjoying the guided relaxation activity.  Group Time: 2:45- 4pm  Participation Level: Active  Behavioral Response: Appropriate  Type of Therapy: Psycho-education Group  Topic: Movie - "Flight/Family Sculpture: the second part of the session opened with film clips from the movie "flight". the opening scene was watched and then discussed. Members identified the egotism of the leading character along with his quick anger and irritability. All agreed there was certainly no humility and lying was his norm. later on, a member sculpted her family. There was good feedback among members as they discussed this woman's family sculpture. The session was powerful, but the two members who had become resistant remained distant throughout the remainder of the session and passive-aggressive behaviors were displayed during this session.   Summary: The patient reported he was doing well. He explained the he and his wife had met with me this morning. The session had gone well and she had found the session to be very interesting and not what she had expected. The patient noted he  was pleased that she had made the effort to come and support him. The patient asked one of the group member's a good question about his use of time yesterday. He got no response, but his question had been gentle, but firm. The patient found the film clips to be very compelling. He was quick to point out the 'dishonesty' of the main character. sculpture very compelling. The patient provided good feedback to the family sculpture and he responded well tot his intervention. He continues to do everything we have asked of him and his sobriety date remains 7/6.   Family Program: Family present? No   Name of family member(s):   UDS collected: No Results:  AA/NA attended?: YesThursday and Friday  Sponsor?: Yes   Sevanna Ballengee, LCAS

## 2014-08-24 NOTE — Telephone Encounter (Signed)
PATIENT IS COMING IN 08/29/14 WE WILL WAIT TILL THEN BECAUSE SHANE REFILL IT 07/15/14 FOR 90DAYS AND IT WAS FOR 5 MG JUST WANT TO WAIT AND SEE WHAT REAL DOSE IS

## 2014-08-24 NOTE — Progress Notes (Unsigned)
Gregory [name] CD-IOP: Discharge. The patient will be discharged from the CD-IOP upon successfully completion of his group session tomorrow. Met with the patient this morning to review his discharge plans. He has done very well in the program and is well established within the [facility]. Please see discharge plan and summary in Epic.       Ahaan Zobrist, LCAS

## 2014-08-25 ENCOUNTER — Encounter (HOSPITAL_COMMUNITY): Payer: Self-pay

## 2014-08-25 ENCOUNTER — Encounter (HOSPITAL_COMMUNITY): Payer: Self-pay | Admitting: Psychology

## 2014-08-25 ENCOUNTER — Other Ambulatory Visit (HOSPITAL_COMMUNITY): Payer: No Typology Code available for payment source | Admitting: Psychology

## 2014-08-25 DIAGNOSIS — F10230 Alcohol dependence with withdrawal, uncomplicated: Secondary | ICD-10-CM

## 2014-08-25 MED ORDER — ACAMPROSATE CALCIUM 333 MG PO TBEC: 666.0000 mg | DELAYED_RELEASE_TABLET | Freq: Three times a day (TID) | ORAL | Status: AC

## 2014-08-25 NOTE — Progress Notes (Signed)
  [facility] Discharge Summary   Gregory [name] [id number]  Date of Admission: 06/26/2014 Date of Discharge: 08/25/2014  Course of Treatment: Pt entered treatment seeking help for his dependence on alcohol.In the distant past he had attained 1 yr sobriety in [place] and decided he could drink alcohol again.He has been both compliant and surrendered to his program-immersing himself in [place] recovery and remaining sober while attending his group and individual IOP counseling sessions.His UDS were all negative .His significant other has continued her alcoholic drinking but has attended family counseling while voicing support for Gregory [name]'s recovery.He reports that at home she is not so supportive .  Goals and Activities to Help Maintain Sobriety: 1. Stay away from old friends who continue to drink and use mind-altering chemicals.Continue detachment with wife's alcoholism. 2. Continue practicing Fair Fighting rules in interpersonal conflicts. 3. Continue alcohol and drug refusal skills and call on support systems. 4. Continue Medical care with PCP Dr [name].  Referrals: NA   Aftercare services:  1. Attend AA/NA meetings 90 meetings in 90 days and begin [place] 2. Continue with sponsor and  home group in [place]. 3. Return to Psychotherapist: Dr [name] as scheduled  Next appointment: Dr Redmond School Sept 08,2015 and fu with Dr [name] as scheduled  Plan of Action to Address Continuing Problems: AA spiritual program of recovery/Sponsorship/Service work as able/Dr Redmond School and Dr [name] as scheduled    Client has participated in the development of this discharge plan and has received a copy of this completed plan  Dara Hoyer  08/25/2014   [facility] 08/25/2014

## 2014-08-25 NOTE — Progress Notes (Signed)
    Daily Group Progress Note  Program: CD-IOP   Group Time: 1-2:30 pm  Participation Level: Active  Behavioral Response: Appropriate and Sharing  Type of Therapy: Process Group  Topic: Process/Psychoeducation - After checking in, the group members went around and processed what had gone on since our last meeting.  The group members were open and everyone shared about recovery related activities and events that they had participated in.  The group then moved into psychoeducation about relapse warning signs.  Group members identified warning signs that they have seen in themselves and discussed ways to stay sober when certain triggers present themselves.  All group members were active during the session today.  Drug tests were collected today.  Group Time: 2:45- 4pm  Participation Level: Active  Behavioral Response: Appropriate  Type of Therapy: Psycho-education Group  Topic: Psychoeducation/Graduation - The second part of group began with a new member introducing herself and telling the group about her history.  The group picked up with the relapse warning sign handout and continued to discuss their experiences with each item on the list.  Group members were able to identify with one another and even provide advice on what has worked for them in the past to deal with triggers.  The end of the session was spent celebrating one group member's graduation from the program.  No relapses were reported in group today.   Summary: The patient reported that his wife was out of town for a few days and said that it was quiet and "wonderful" in the house during her absence.  He also talked about his daughters and reported that one had taken a full time teaching position.  The patient discussed his phone call with his mother regarding her trip to [place] with his daughter.  His mother wanted him to make sure that his daughter knew she needed to bring enough money to pay for the trip.  The patient  reported attending daily meetings since our last meeting and will graduate on Friday from the program.  He will also be picking up his 60 day chip at his [place] meeting on Friday.   Interventions have proven effective and his sobriety date remains 7/6.   Family Program: Family present? No   Name of family member(s):   UDS collected: No Results:   AA/NA attended?: YesTuesday and Wednesday  Sponsor?: Yes   Jaide Hillenburg, LCAS

## 2014-08-29 ENCOUNTER — Encounter: Payer: Self-pay | Admitting: Family Medicine

## 2014-08-29 ENCOUNTER — Ambulatory Visit (INDEPENDENT_AMBULATORY_CARE_PROVIDER_SITE_OTHER): Payer: Medicare Other | Admitting: Family Medicine

## 2014-08-29 VITALS — BP 120/70 | HR 60 | Wt 141.0 lb

## 2014-08-29 DIAGNOSIS — G8929 Other chronic pain: Secondary | ICD-10-CM

## 2014-08-29 DIAGNOSIS — M545 Low back pain, unspecified: Secondary | ICD-10-CM

## 2014-08-29 DIAGNOSIS — Z8739 Personal history of other diseases of the musculoskeletal system and connective tissue: Secondary | ICD-10-CM

## 2014-08-29 NOTE — Progress Notes (Signed)
   Subjective:    Patient ID: Gregory [name], male    DOB: Nov 05, 1951, 63 y.o.   MRN: [id number]  HPI He is here for consult concerning low back pain. He has a long history of difficulty with back pain. He has had MRI which did show spinal stenosis. Apparently he did have injections but states they're in the SI joint. This was several years ago. He states that the pain has been there but in the last 6 months has been getting much worse. He complains of bilateral burning sensation into both legs with physical activity. He also notes weakness in his quad and hamstrings. He also states it has decreased sensory in his great and second toes bilaterally   Review of Systems     Objective:   Physical Exam Alert and in no distress. There is some quad and hamstring weakness on testing. Good strength in his ankles. Bilateral knee reflexes were 3+ with 0-1+ in the ankles. No clonus. Sensation was grossly intact.       Assessment & Plan:  History of spinal stenosis - Plan: MR Lumbar Spine Wo Contrast  Chronic low back pain - Plan: MR Lumbar Spine Wo Contrast

## 2014-08-30 ENCOUNTER — Other Ambulatory Visit (HOSPITAL_COMMUNITY): Payer: No Typology Code available for payment source

## 2014-08-30 ENCOUNTER — Telehealth: Payer: Self-pay | Admitting: Internal Medicine

## 2014-08-30 NOTE — Telephone Encounter (Signed)
Faxed over medical records to [facility] @ [phone] on 08/24/14

## 2014-09-01 ENCOUNTER — Other Ambulatory Visit (HOSPITAL_COMMUNITY): Payer: No Typology Code available for payment source

## 2014-09-02 ENCOUNTER — Ambulatory Visit
Admission: RE | Admit: 2014-09-02 | Discharge: 2014-09-02 | Disposition: A | Discharge status: Home / Self Care | Payer: Medicare Other | Source: Ambulatory Visit | Attending: Family Medicine | Admitting: Family Medicine

## 2014-09-02 DIAGNOSIS — G8929 Other chronic pain: Secondary | ICD-10-CM

## 2014-09-02 DIAGNOSIS — M545 Low back pain, unspecified: Secondary | ICD-10-CM

## 2014-09-02 DIAGNOSIS — Z8739 Personal history of other diseases of the musculoskeletal system and connective tissue: Secondary | ICD-10-CM

## 2014-09-03 ENCOUNTER — Other Ambulatory Visit: Payer: Self-pay

## 2014-09-04 ENCOUNTER — Other Ambulatory Visit: Payer: Self-pay

## 2014-09-04 ENCOUNTER — Encounter (HOSPITAL_COMMUNITY): Payer: Self-pay | Admitting: Psychology

## 2014-09-04 MED ORDER — AMLODIPINE BESYLATE 10 MG PO TABS: 10.0000 mg | ORAL_TABLET | Freq: Every day | ORAL | Status: DC

## 2014-09-04 NOTE — Progress Notes (Signed)
° ° °  Daily Group Progress Note  Program: CD-IOP   Group Time: 1-2:30 pm  Participation Level: Active  Behavioral Response: Appropriate and Sharing  Type of Therapy: Process Group  Topic: Group Process: the first part of group was spent in process. Members shared about current issues and concerns. One member arrived late and made some excuses about his 76 minute late arrival. Another member called him out on his bullshit. In turn, she was questioned by fellow group members since she has never spoken out. They wondered what was going on with her? She also had many excuses and the group didnt appear to buy into her explanations. During this session, the program director met with new members as well as ones discharging.   Group Time: 2:45- 4pm  Participation Level: Active  Behavioral Response: Appropriate and Sharing  Type of Therapy: Psycho-education Group  Topic: Relapse Warning Signs/Step One Reading/Graduation: the second half of group was spent in a psycho-ed on relapse warning signs. A handout was provided with members identifying those listed. Members identified their relapse triggers and discussed them. A member read her Step One handout. It was a very poignant reading and she cried as she recounted the damage from her alcohol use. The session ended with two graduations. There was cake and a pie to celebrate and kind words shared by all.   Summary: The patient reported he was doing well. He had picked up his 60 day chip today and was graduating from this program as well. He admitted it was a special day. The patient asked good questions of his fellow group members and provided good feedback. In part two, the patient agreed that holding feelings in had always been one of his biggest relapse triggers or signs. He provided good support and complimented the member who read her Step One. This patient had brought the cake and pie and seemed delighted as members complimented him on  his baking. Even during his graduation ceremony, as the medallion was passed around, this patient deflected and minimized the kind words of respect and caring expressed by his fellow group members. The patient leaves the program with good progress in his recovery and well-connected in the [facility]. He leaves the program in full compliance and his sobriety date remains 7/6.  He met with the program director today for his discharge.    Family Program: Family present? No   Name of family member(s):   UDS collected: No Results:   AA/NA attended?: YesThursday and Friday  Sponsor?: Yes   Jansel Vonstein, LCAS

## 2014-09-18 DIAGNOSIS — M4310 Spondylolisthesis, site unspecified: Secondary | ICD-10-CM | POA: Insufficient documentation

## 2014-09-21 ENCOUNTER — Encounter (HOSPITAL_COMMUNITY): Payer: Self-pay

## 2014-09-21 NOTE — Progress Notes (Unsigned)
    Daily Group Progress Note  Program: CD-IOP   Group Time: 1-2:30 pm  Participation Level: Active  Behavioral Response: Appropriate and Sharing  Type of Therapy: Psycho-education Group  Topic: Triggers: the first part of group was spent in a psycho-ed on "Triggers". Members identified some of their triggers and a discussion followed on addressing these triggers. There was good disclosure and feedback among group members.  Group Time: 2:45- 4pm  Participation Level: Active  Behavioral Response: Sharing  Type of Therapy: Psycho-education Group  Topic: Refusal Skills: the second half of group was spent discussing handouts and refusal skills. Members read from the handouts and discussed the options available when confronted or feeling pressured to use. Members challenged some of the examples, but the ensuing discussion was able to put them into perspective.   Summary: Patient was present for group and participated. Patient discussed his recovery and his relationship with his wife. Patient discussed his wife's active use of alcohol and how she prefers to keep his addiction a secret. Patient discussed the various struggles of living with an active user who will not allow him the opportunity to discuss his addiction with others. Patient participated in reading and discussing the handouts "Saying No: Cases to Consider" and "Some Suggestions About Saying No." Patient expressed the motivation for him to say "no" while in recovery as "how far" he has come and the mantra "medication and medication."   Family Program: Family present? No   Name of family member(s):   UDS collected: No Results:   AA/NA attended?: YesSaturday  Sponsor?: Yes   [facility]

## 2014-10-03 ENCOUNTER — Other Ambulatory Visit: Payer: Self-pay | Admitting: Neurological Surgery

## 2014-10-17 ENCOUNTER — Encounter (HOSPITAL_COMMUNITY): Payer: Self-pay

## 2014-10-17 ENCOUNTER — Encounter (HOSPITAL_COMMUNITY)
Admission: RE | Admit: 2014-10-17 | Discharge: 2014-10-17 | Disposition: A | Discharge status: Home / Self Care | Payer: Medicare Other | Source: Ambulatory Visit | Attending: Neurological Surgery | Admitting: Neurological Surgery

## 2014-10-17 DIAGNOSIS — Z720 Tobacco use: Secondary | ICD-10-CM | POA: Insufficient documentation

## 2014-10-17 DIAGNOSIS — G709 Myoneural disorder, unspecified: Secondary | ICD-10-CM | POA: Insufficient documentation

## 2014-10-17 DIAGNOSIS — I739 Peripheral vascular disease, unspecified: Secondary | ICD-10-CM | POA: Diagnosis not present

## 2014-10-17 DIAGNOSIS — I6523 Occlusion and stenosis of bilateral carotid arteries: Secondary | ICD-10-CM | POA: Diagnosis not present

## 2014-10-17 DIAGNOSIS — F1010 Alcohol abuse, uncomplicated: Secondary | ICD-10-CM | POA: Insufficient documentation

## 2014-10-17 DIAGNOSIS — I4510 Unspecified right bundle-branch block: Secondary | ICD-10-CM | POA: Insufficient documentation

## 2014-10-17 DIAGNOSIS — I350 Nonrheumatic aortic (valve) stenosis: Secondary | ICD-10-CM | POA: Diagnosis not present

## 2014-10-17 DIAGNOSIS — G4733 Obstructive sleep apnea (adult) (pediatric): Secondary | ICD-10-CM | POA: Insufficient documentation

## 2014-10-17 DIAGNOSIS — I2510 Atherosclerotic heart disease of native coronary artery without angina pectoris: Secondary | ICD-10-CM | POA: Diagnosis not present

## 2014-10-17 DIAGNOSIS — E785 Hyperlipidemia, unspecified: Secondary | ICD-10-CM | POA: Diagnosis not present

## 2014-10-17 DIAGNOSIS — Z01818 Encounter for other preprocedural examination: Secondary | ICD-10-CM | POA: Insufficient documentation

## 2014-10-17 DIAGNOSIS — G8929 Other chronic pain: Secondary | ICD-10-CM | POA: Diagnosis not present

## 2014-10-17 DIAGNOSIS — I10 Essential (primary) hypertension: Secondary | ICD-10-CM | POA: Insufficient documentation

## 2014-10-17 DIAGNOSIS — G4089 Other seizures: Secondary | ICD-10-CM | POA: Insufficient documentation

## 2014-10-17 DIAGNOSIS — J45909 Unspecified asthma, uncomplicated: Secondary | ICD-10-CM | POA: Diagnosis not present

## 2014-10-17 DIAGNOSIS — M549 Dorsalgia, unspecified: Secondary | ICD-10-CM | POA: Insufficient documentation

## 2014-10-17 DIAGNOSIS — R001 Bradycardia, unspecified: Secondary | ICD-10-CM | POA: Insufficient documentation

## 2014-10-17 DIAGNOSIS — F319 Bipolar disorder, unspecified: Secondary | ICD-10-CM | POA: Insufficient documentation

## 2014-10-17 HISTORY — DX: Pneumonia, unspecified organism: J18.9

## 2014-10-17 LAB — CBC WITH DIFFERENTIAL/PLATELET
Basophils Absolute: 0 10*3/uL (ref 0.0–0.1)
Basophils Relative: 1 % (ref 0–1)
Eosinophils Absolute: 0.1 10*3/uL (ref 0.0–0.7)
Eosinophils Relative: 2 % (ref 0–5)
HCT: 42.5 % (ref 39.0–52.0)
Hemoglobin: 14.7 g/dL (ref 13.0–17.0)
Lymphocytes Relative: 36 % (ref 12–46)
Lymphs Abs: 2.1 10*3/uL (ref 0.7–4.0)
MCH: 31.4 pg (ref 26.0–34.0)
MCHC: 34.6 g/dL (ref 30.0–36.0)
MCV: 90.8 fL (ref 78.0–100.0)
Monocytes Absolute: 0.8 10*3/uL (ref 0.1–1.0)
Monocytes Relative: 13 % — ABNORMAL HIGH (ref 3–12)
Neutro Abs: 2.9 10*3/uL (ref 1.7–7.7)
Neutrophils Relative %: 48 % (ref 43–77)
Platelets: 159 10*3/uL (ref 150–400)
RBC: 4.68 MIL/uL (ref 4.22–5.81)
RDW: 13.3 % (ref 11.5–15.5)
WBC: 5.9 10*3/uL (ref 4.0–10.5)

## 2014-10-17 LAB — COMPREHENSIVE METABOLIC PANEL
ALT: 27 U/L (ref 0–53)
AST: 27 U/L (ref 0–37)
Albumin: 4 g/dL (ref 3.5–5.2)
Alkaline Phosphatase: 75 U/L (ref 39–117)
Anion gap: 13 (ref 5–15)
BUN: 12 mg/dL (ref 6–23)
CO2: 28 mEq/L (ref 19–32)
Calcium: 9.5 mg/dL (ref 8.4–10.5)
Chloride: 100 mEq/L (ref 96–112)
Creatinine, Ser: 0.73 mg/dL (ref 0.50–1.35)
GFR calc Af Amer: >90 mL/min (ref >90)
GFR calc non Af Amer: >90 mL/min (ref >90)
Glucose, Bld: 95 mg/dL (ref 70–99)
Potassium: 4.2 mEq/L (ref 3.7–5.3)
Sodium: 141 mEq/L (ref 137–147)
Total Bilirubin: 0.2 mg/dL — ABNORMAL LOW (ref 0.3–1.2)
Total Protein: 8.1 g/dL (ref 6.0–8.3)

## 2014-10-17 LAB — SURGICAL PCR SCREEN
MRSA, PCR: NEGATIVE
Staphylococcus aureus: NEGATIVE

## 2014-10-17 LAB — PROTIME-INR
INR: 1.01 (ref 0.00–1.49)
Prothrombin Time: 13.4 seconds (ref 11.6–15.2)

## 2014-10-17 LAB — ABO/RH: ABO/RH(D): AB POS

## 2014-10-17 LAB — APTT: aPTT: 41 seconds — ABNORMAL HIGH (ref 24–37)

## 2014-10-17 NOTE — Progress Notes (Addendum)
Anesthesia PAT Evaluation:  Patient is a 63 year old male scheduled for one level MAS, PLIF on 10/25/14 by Dr. Ronnald Ramp.  History includes smoking, HTN, Bipolar disorder, alcohol abuse (sober for at least 4 months), OSA without CPAP use, asthma, moderate AS by 08/2013 echo, right BBB, HLD, nocturnal frontal lobe seizures, neuromuscular disorder (not specified), carotid occlusive disease (bilateral 1-39% ICAS by carotid duplex 07/03/10), claudication, chronic back pain, PNA 08/2013 with mildly elevated troponins (felt likely due to demand ischemia) with extensive coronary calcification on 08/29/13 CTA. PCP is Dr. Charm Rings.  Cardiologist is Dr. [name], last visit 10/05/13.  Meds include: Advair, MVI, Campral, albuterol, amlodipine, ASA 81 mg, atorvastatin, Depakote, Flonase, Lamictal, lisinopril-HCTZ, Lopressor.  Nuclear stress test on 09/29/13 showed: Overall Impression: Intermediate risk stress nuclear study . There is a small- medium sized area of ischemia at the apex.  LV Ejection Fraction: 77%. LV Wall Motion: NL LV Function; NL Wall Motion. (Dr. [name] felt results were unchanged from 2012 and therefore felt it was overall a low risk study and continued medical management was recommended.)  Echo on 08/29/13 showed:  - Left ventricle: The cavity size was normal. Systolic function was normal. The estimated ejection fraction was in the range of 55% to 60%. Wall motion was normal; there were no regional wall motion abnormalities. Features are consistent with a pseudonormal left ventricular filling pattern, with concomitant abnormal relaxation and increased filling pressure (grade 2 diastolic dysfunction). - Aortic valve: Moderate thickening and calcification. There was moderate stenosis. Valve area: 1.13cm^2(VTI). Valve area: 1.12cm^2 (Vmax). Follow-up echo in one year recommended.  EKG on 01/19/14 showed SR, right BBB, LAFB.  No significant change since last tracing.    CXR on 05/26/14 showed: Right lower  lobe peripheral subtle small focal opacity compatible with atelectasis or developing airspace disease. Recommend radiographic follow-up to document resolution. (Ordered at his PCP office for evaluation of head and chest congestion, s/p antibiotic treatment. Defer follow-up recommendations to the ordering practitioner.)  Preoperative labs noted.   Patient denies chest pain, SOB, edema, syncope, palpitations.  Activity is limited by back and LE pain.  He denies any acute respiratory symptoms. No fever.  On exam, heart regular, bradycardic at ~ 50 bpm.  Grade II/VI harsh SEM.  Lungs with faint expiratory wheeze in right base that cleared with deep breathing, otherwise clear.  No crackles noted.  No LE ankle edema noted.   I discussed with anesthesiologist Dr. Therisa Doyne.  It has been just over a year since his last cardiology visit.  He has a known abnormal but stable stress test then.  Echo showed moderate AS.  Recommend cardiology clearance.  Patient is aware. I have notified Lorriane Shire at Dr. Ronnald Ramp' office.  She will work on arranging clearance and let the patient know about any details.  Gregory [name] [facility]/Anesthesiology Phone [phone] 10/17/2014 3:04 PM  Addendum: Patient was seen by Dr. [name] earlier today.  Moderate AS is asymptomatic.  Clinically no symptoms of angina. He ultimately cleared patient for surgery as scheduled with plans to schedule a follow-up echo in the next few months.  B-blocker was decreased due to bradycardia. If no acute changes then plan to proceed.  Gregory [name] [facility]/Anesthesiology Phone [phone] 10/19/2014 4:06 PM

## 2014-10-17 NOTE — Pre-Procedure Instructions (Signed)
Edrees Valent  10/17/2014   Your procedure is scheduled on:  Wednesday October 25, 2014 at 8:30 AM.  Report to [facility] Admitting at 6:30 AM.  Call this number if you have problems the morning of surgery: [phone]   Remember:   Do not eat food or drink liquids after midnight.   Take these medicines the morning of surgery with A SIP OF WATER: Albuterol inhaler if needed, Amlodipine (Norvasc), Flonase nasal spray, Advair inhaler, Lamotrigine (Lamictal), Metoprolol (Lopressor), and Visine eye drops if needed   Do not wear jewelry.  Do not wear lotions, powders, or cologne.   Do not shave 48 hours prior to surgery.   Do not bring valuables to the hospital.  [facility] is not responsible for any belongings or valuables.               Contacts, dentures or bridgework may not be worn into surgery.  Leave suitcase in the car. After surgery it may be brought to your room.  For patients admitted to the hospital, discharge time is determined by your treatment team.               Patients discharged the day of surgery will not be allowed to drive home.  Name and phone number of your driver: Family/Friend  Special Instructions: Shower using CHG soap the night before and the morning of your surgery   Please read over the following fact sheets that you were given: Pain Booklet, Coughing and Deep Breathing, Blood Transfusion Information, MRSA Information and Surgical Site Infection Prevention

## 2014-10-18 LAB — TYPE AND SCREEN
ABO/RH(D): AB POS
Antibody Screen: NEGATIVE

## 2014-10-19 ENCOUNTER — Encounter: Payer: Self-pay | Admitting: Cardiology

## 2014-10-19 ENCOUNTER — Ambulatory Visit (INDEPENDENT_AMBULATORY_CARE_PROVIDER_SITE_OTHER): Payer: Medicare Other | Admitting: Cardiology

## 2014-10-19 ENCOUNTER — Other Ambulatory Visit: Payer: Self-pay

## 2014-10-19 VITALS — BP 120/70 | HR 44 | Ht 65.0 in | Wt 142.0 lb

## 2014-10-19 DIAGNOSIS — I2510 Atherosclerotic heart disease of native coronary artery without angina pectoris: Secondary | ICD-10-CM

## 2014-10-19 DIAGNOSIS — I10 Essential (primary) hypertension: Secondary | ICD-10-CM

## 2014-10-19 DIAGNOSIS — I350 Nonrheumatic aortic (valve) stenosis: Secondary | ICD-10-CM

## 2014-10-19 DIAGNOSIS — Z720 Tobacco use: Secondary | ICD-10-CM

## 2014-10-19 DIAGNOSIS — F17200 Nicotine dependence, unspecified, uncomplicated: Secondary | ICD-10-CM

## 2014-10-19 MED ORDER — METOPROLOL TARTRATE 25 MG PO TABS: 12.5000 mg | ORAL_TABLET | Freq: Every day | ORAL | Status: DC

## 2014-10-19 NOTE — Progress Notes (Signed)
Gregory [name] Date of Birth: [birth year] Medical Record #[id number]  History of Present Illness: Gregory [name] is seen today for pre op evaluation for lumbar surgery by Dr. Ronnald Ramp.  He has a known history of coronary disease. This is based on a prior CT of the chest showing extensive coronary calcification. He had a Myoview studyt in October 2014 that showed a small area of apical ischema. This was unchanged from 2012 and the patient was asymptomatic so he was treated medially. He also has moderate aortic stenosis.  He has a history of ongoing tobacco abuse, HTN, and hyperlipidemia. He states he is doing well. Only takes metoprolol once a day. Denies any chest pain or SOB. Limited by back pain.  Current Outpatient Prescriptions on File Prior to Visit  Medication Sig Dispense Refill  . acamprosate (CAMPRAL) 333 MG tablet Take 666 mg by mouth 3 (three) times daily.      [name] albuterol (PROVENTIL HFA;VENTOLIN HFA) 108 (90 BASE) MCG/ACT inhaler Inhale 2 puffs into the lungs every 6 (six) hours as needed for wheezing.      [name] amLODipine (NORVASC) 10 MG tablet Take 1 tablet (10 mg total) by mouth daily.  90 tablet  1  . aspirin EC 81 MG tablet Take 1 tablet (81 mg total) by mouth daily.  30 tablet  0  . atorvastatin (LIPITOR) 20 MG tablet Take 1 tablet (20 mg total) by mouth daily.  90 tablet  1  . divalproex (DEPAKOTE) 500 MG DR tablet Take 4 tablets (2,000 mg total) by mouth at bedtime.  480 tablet  3  . fluticasone (FLONASE) 50 MCG/ACT nasal spray Place 2 sprays into the nose daily.  48 g  3  . Fluticasone-Salmeterol (ADVAIR DISKUS) 500-50 MCG/DOSE AEPB Inhale 1 puff into the lungs every 12 (twelve) hours.  180 each  3  . lamoTRIgine (LAMICTAL) 200 MG tablet Take 1 tablet (200 mg total) by mouth every morning.  90 tablet  3  . lisinopril-hydrochlorothiazide (PRINZIDE,ZESTORETIC) 20-12.5 MG per tablet Take 1 tablet by mouth every morning.  90 tablet  1  . Tetrahydrozoline HCl (VISINE OP) Place 2 drops into  both eyes as needed (for dry eyes).       No current facility-administered medications on file prior to visit.    Allergies  Allergen Reactions  . Codeine Anaphylaxis  . Amoxicillin Diarrhea  . Augmentin [Amoxicillin-Pot Clavulanate] Diarrhea    Past Medical History  Diagnosis Date  . Allergy   . Hypertension   . Bipolar disorder   . Alcohol abuse   . Tobacco use disorder   . Asthma   . Aortic stenosis   . Neuromuscular disorder   . Family history of skin cancer   . Hyperlipidemia   . RBBB     a. 7.2012 low risk nuclear exam, sm area of apical ischemia.  . Carotid artery occlusion   . Claudication   . Smoker   . Chronic back pain   . Heart murmur     slight  . Pneumonia     hx of  . Sleep apnea     does not wear CPAP  . CAD (coronary artery disease)     coronary calcifications 08/2013 CTA  . Spinal stenosis at L4-L5 level     Past Surgical History  Procedure Laterality Date  . Colonoscopy  2009  . Vasectomy reversal    . US echocardiography  06/20/2010    EF 55-60%  . Anal fistulectomy  09/25/11  .  Vasectomy      X 2  . Elbow surgery      bilaterally for cubital tunnel  . Tonsillectomy  age 58    History  Smoking status  . Current Every Day Smoker -- 1.00 packs/day for 15 years  . Types: Cigarettes  Smokeless tobacco  . Never Used    History  Alcohol Use  . 44.0 oz/week  . 50 Glasses of wine, 28 Drinks containing 0.5 oz of alcohol per week    Comment: 2 glasses of wine per night.    Family History  Problem Relation Age of Onset  . Stroke Father 65  . Cancer Father     skin  . Drug abuse Father   . Hypertension Brother   . Diabetes Brother   . Drug abuse Brother   . Cancer Maternal Aunt     skin  . Cancer Maternal Grandmother     liver    Review of Systems: As noted in history of present illness.  All other systems were reviewed and are negative.  Physical Exam: BP 120/70  Pulse 44  Ht 5\' 5"  (1.651 m)  Wt 142 lb (64.411 kg)  BMI  23.63 kg/m2 He is a pleasant white male in no acute distress. HEENT: Oral cephalic, atraumatic. Pupils equal round and reactive to light accommodation. Extraocular movements are full. Oropharynx is clear. Neck is supple without JVD, adenopathy, or thyromegaly. He has mild bilateral carotid bruits. Lungs: Clear Cardiovascular: Regular rate and rhythm. Normal S1 and S2. He has a grade 2/6 systolic murmur in the right upper sternal border radiating to the apex and towards the carotids. Abdomen: Soft and nontender. I'll sounds are positive. No masses or bruits. Extremities: Femoral pulses are 2+ and symmetric. Distal pulses are trace to 1+. He has no edema. Neurologic exam: Alert and oriented x3. Cranial nerves II through XII are intact.  LABORATORY DATA: ECG today shows sinus brady with a left anterior fascicular block and right bundle branch block. Rate 44 bpm. I have personally reviewed and interpreted this study.   Cardiology Nuclear Med Study  Gregory [name] is a 63 y.o. male MRN : [id number] DOB: 12-11-1951  Procedure Date: 09/29/2013  Nuclear Med Background  Indication for Stress Test: Evaluation for Ischemia and Post Hospital-9/14 Pre-syncope, Increased enzymes  History: 21014 Echo EF 55-60%, mod AS, 2012 MPS Abnormal, EF 81%, CT- Coronary Calcifications  Cardiac Risk Factors: Carotid Disease, Family History - CAD, Hypertension, Lipids, RBBB and Smoker  Symptoms: Dizziness, Light-Headedness and Near Syncope  Nuclear Pre-Procedure  Caffeine/Decaff Intake: None  NPO After: 9:00pm   O2 Sat: 97% on room air.  IV 0.9% NS with Angio Cath: 22g   IV Site: R Antecubital  IV Started by: Crissie Figures, RN   Chest Size (in): 42  Cup Size: n/a   Height: 5\' 5"  (1.651 m)  Weight: 134 lb (60.782 kg)   BMI: Body mass index is 22.3 kg/(m^2).  Tech Comments: N/A   Nuclear Med Study  1 or 2 day study: 1 day  Stress Test Type: Lexiscan   Reading MD: Mertie Moores, MD  Order Authorizing Provider: Shaneen Reeser  [name], MD   Resting Radionuclide: Technetium 36m Sestamibi  Resting Radionuclide Dose: 11.0 mCi   Stress Radionuclide: Technetium 46m Sestamibi  Stress Radionuclide Dose: 33.0 mCi   Stress Protocol  Rest HR: 56  Stress HR: 90   Rest BP: 151/73  Stress BP: 156/69   Exercise Time (min): n/a  METS: n/a  Predicted Max HR: 157 bpm  % Max HR: 57.32 bpm  Rate Pressure Product: 14040  Dose of Adenosine (mg): n/a  Dose of Lexiscan: 0.4 mg   Dose of Atropine (mg): n/a  Dose of Dobutamine: n/a mcg/kg/min (at max HR)   Stress Test Technologist: Glade Lloyd, BS-ES  Nuclear Technologist: Annye Rusk, CNMT   Rest Procedure: Myocardial perfusion imaging was performed at rest 45 minutes following the intravenous administration of Technetium 19m Sestamibi.  Rest ECG: NSR-RBBB  Stress Procedure: The patient received IV Lexiscan 0.4 mg over 15-seconds. Patient experienced stomach "hurting" that resolved in recovery. Technetium 46m Sestamibi injected at 30-seconds. Quantitative spect images were obtained after a 45 minute delay.  Stress ECG: No significant change from baseline ECG  QPS  Raw Data Images: Normal; no motion artifact; normal heart/lung ratio.  Stress Images: there is a small- medium sized area of moderately severe attenuation at the apex with normal uptake in the other regions.  Rest Images: Normal homogeneous uptake in all areas of the myocardium.  Subtraction (SDS): There is a small area of reversible ischemia in the apex.  Transient Ischemic Dilatation (Normal <1.22): N/A  Lung/Heart Ratio (Normal <0.45): 0.35  Quantitative Gated Spect Images  QGS EDV: 91 ml  QGS ESV: 21 ml  Impression  Exercise Capacity: Lexiscan with no exercise.  BP Response: Normal blood pressure response.  Clinical Symptoms: No significant symptoms noted.  ECG Impression: No significant ST segment change suggestive of ischemia.  Comparison with Prior Nuclear Study: 2012  Overall Impression: Intermediate risk  stress nuclear study . There is a small- medium sized area of ischemia at the apex. .  LV Ejection Fraction: 77%. LV Wall Motion: NL LV Function; NL Wall Motion.  Thayer Headings, Gregory Bonito., MD, [facility]  Echo: 08/29/14:Study Conclusions  - Left ventricle: The cavity size was normal. Systolic function was normal. The estimated ejection fraction was in the range of 55% to 60%. Wall motion was normal; there were no regional wall motion abnormalities. Features are consistent with a pseudonormal left ventricular filling pattern, with concomitant abnormal relaxation and increased filling pressure (grade 2 diastolic dysfunction). - Aortic valve: Moderate thickening and calcification. There was moderate stenosis. Valve area: 1.13cm^2(VTI). Valve area: 1.12cm^2 (Vmax).   Assessment / Plan: 1. Coronary disease.  Last  Myoview study shows a small area of ischemia in the apex. This is unchanged from 2012. He really has no clinical symptoms of angina. Overall this is a low risk study and I would recommend continued medical management given his lack of symptoms. Recommend continued aspirin, amlodipine, and statin therapy. We will decrease metoprolol to 12.5 mg daily due to bradycardia. d  2. Hypertension-control is not optimal. We'll add beta blocker therapy to his ACE inhibitor and amlodipine.  3. Moderate aortic stenosis.  He is asymptomatic. Certainly by exam he does not have severe AS. Will go ahead and clear him for lumbar surgery this week. We will schedule him for a routine follow up Echo in the next several months.  4. PAD with chronic claudication.  5. Tobacco abuse. Have strongly encouraged smoking cessation.  6. Hyperlipidemia.  7. Right bundle branch block with left anterior fascicular block-chronic  8. Bipolar disorder.  9. Recent pneumonia.  He is cleared to proceed with back surgery from a cardiac standpoint. We will be available if problems arise.

## 2014-10-19 NOTE — Patient Instructions (Signed)
Reduce your metoprolol to 12.5 mg daily  Continue your other therapy  We will clear you for your back surgery  We will schedule you for an Echocardiogram

## 2014-10-23 NOTE — Anesthesia Preprocedure Evaluation (Addendum)
Anesthesia Evaluation  Patient identified by MRN, date of birth, ID band Patient awake    Reviewed: Allergy & Precautions, H&P , NPO status , Patient's Chart, lab work & pertinent test results  History of Anesthesia Complications Negative for: history of anesthetic complications  Airway Mallampati: III  TM Distance: >3 FB Neck ROM: Full    Dental  (+) Poor Dentition, Dental Advisory Given, Caps   Pulmonary asthma , sleep apnea (no longer uses CPAP) , COPD COPD inhaler, Current Smoker (40 pluspack year),    + wheezing      Cardiovascular hypertension, Pt. on medications and Pt. on home beta blockers + CAD and + Peripheral Vascular Disease + Valvular Problems/Murmurs AS Rhythm:Regular Rate:Normal + Systolic murmurs 37/3428 STRESS:s mall- medium sized area of ischemia at the apex. .LV Ejection Fraction: 77%. LV Wall Motion: NL LV Function; NL Wall Motion.   9/15 ECHO EF 55-60% with grade 2 diastolic dysfunction,  AS valve 1.1cm (mod stenosis), cleared recently by cardiologist   Neuro/Psych Bipolar Disorder HX ETOH abuseChronic back pain    GI/Hepatic negative GI ROS, Neg liver ROS,   Endo/Other  negative endocrine ROS  Renal/GU negative Renal ROS     Musculoskeletal   Abdominal   Peds  Hematology   Anesthesia Other Findings   Reproductive/Obstetrics                          Anesthesia Physical Anesthesia Plan  ASA: III  Anesthesia Plan: General   Post-op Pain Management:    Induction: Intravenous  Airway Management Planned: Oral ETT  Additional Equipment: Arterial line  Intra-op Plan:   Post-operative Plan: Extubation in OR  Informed Consent: I have reviewed the patients History and Physical, chart, labs and discussed the procedure including the risks, benefits and alternatives for the proposed anesthesia with the patient or authorized representative who has indicated his/her  understanding and acceptance.   Dental advisory given  Plan Discussed with: CRNA and Surgeon  Anesthesia Plan Comments: (ASA III+, moderate AS and moderate risk stress test, also COPD, A line would be helpful, plan on second IV if any significant Blood loss)       Anesthesia Quick Evaluation

## 2014-10-24 MED ORDER — DEXAMETHASONE SODIUM PHOSPHATE 10 MG/ML IJ SOLN
10.0000 mg | INTRAMUSCULAR | Status: AC
  Administered 2014-10-25: 10 mg via INTRAVENOUS
  Filled 2014-10-24: qty 1

## 2014-10-24 MED ORDER — VANCOMYCIN HCL IN DEXTROSE 1-5 GM/200ML-% IV SOLN
1000.0000 mg | INTRAVENOUS | Status: AC
  Administered 2014-10-25: 1000 mg via INTRAVENOUS
  Filled 2014-10-24: qty 200

## 2014-10-25 ENCOUNTER — Inpatient Hospital Stay (HOSPITAL_COMMUNITY)
Admission: RE | Admit: 2014-10-25 | Discharge: 2014-10-26 | DRG: 460 | Disposition: A | Discharge status: Home / Self Care | Payer: Medicare Other | Source: Ambulatory Visit | Attending: Neurological Surgery | Admitting: Neurological Surgery

## 2014-10-25 ENCOUNTER — Inpatient Hospital Stay (HOSPITAL_COMMUNITY): Payer: Medicare Other | Admitting: Anesthesiology

## 2014-10-25 ENCOUNTER — Encounter (HOSPITAL_COMMUNITY)
Admission: RE | Disposition: A | Discharge status: Home / Self Care | Payer: Self-pay | Source: Ambulatory Visit | Attending: Neurological Surgery

## 2014-10-25 ENCOUNTER — Inpatient Hospital Stay (HOSPITAL_COMMUNITY): Payer: Medicare Other | Admitting: Vascular Surgery

## 2014-10-25 ENCOUNTER — Inpatient Hospital Stay (HOSPITAL_COMMUNITY): Payer: Medicare Other

## 2014-10-25 ENCOUNTER — Encounter (HOSPITAL_COMMUNITY): Payer: Self-pay | Admitting: *Deleted

## 2014-10-25 DIAGNOSIS — M4310 Spondylolisthesis, site unspecified: Principal | ICD-10-CM | POA: Diagnosis present

## 2014-10-25 DIAGNOSIS — Z7982 Long term (current) use of aspirin: Secondary | ICD-10-CM

## 2014-10-25 DIAGNOSIS — F319 Bipolar disorder, unspecified: Secondary | ICD-10-CM | POA: Diagnosis not present

## 2014-10-25 DIAGNOSIS — I10 Essential (primary) hypertension: Secondary | ICD-10-CM | POA: Diagnosis not present

## 2014-10-25 DIAGNOSIS — G4730 Sleep apnea, unspecified: Secondary | ICD-10-CM | POA: Diagnosis present

## 2014-10-25 DIAGNOSIS — F1010 Alcohol abuse, uncomplicated: Secondary | ICD-10-CM | POA: Diagnosis present

## 2014-10-25 DIAGNOSIS — M4806 Spinal stenosis, lumbar region: Secondary | ICD-10-CM | POA: Diagnosis not present

## 2014-10-25 DIAGNOSIS — Z79899 Other long term (current) drug therapy: Secondary | ICD-10-CM

## 2014-10-25 DIAGNOSIS — I2510 Atherosclerotic heart disease of native coronary artery without angina pectoris: Secondary | ICD-10-CM | POA: Diagnosis present

## 2014-10-25 DIAGNOSIS — F17210 Nicotine dependence, cigarettes, uncomplicated: Secondary | ICD-10-CM | POA: Diagnosis present

## 2014-10-25 DIAGNOSIS — J45909 Unspecified asthma, uncomplicated: Secondary | ICD-10-CM | POA: Diagnosis present

## 2014-10-25 DIAGNOSIS — Z981 Arthrodesis status: Secondary | ICD-10-CM

## 2014-10-25 DIAGNOSIS — E785 Hyperlipidemia, unspecified: Secondary | ICD-10-CM | POA: Diagnosis present

## 2014-10-25 DIAGNOSIS — I350 Nonrheumatic aortic (valve) stenosis: Secondary | ICD-10-CM | POA: Diagnosis present

## 2014-10-25 DIAGNOSIS — Z885 Allergy status to narcotic agent status: Secondary | ICD-10-CM | POA: Diagnosis not present

## 2014-10-25 HISTORY — PX: MAXIMUM ACCESS (MAS)POSTERIOR LUMBAR INTERBODY FUSION (PLIF) 1 LEVEL: SHX6368

## 2014-10-25 SURGERY — FOR MAXIMUM ACCESS (MAS) POSTERIOR LUMBAR INTERBODY FUSION (PLIF) 1 LEVEL
Anesthesia: General | Site: Back

## 2014-10-25 MED ORDER — 0.9 % SODIUM CHLORIDE (POUR BTL) OPTIME
TOPICAL | Status: DC | PRN
  Administered 2014-10-25: 1000 mL

## 2014-10-25 MED ORDER — ONDANSETRON HCL 4 MG/2ML IJ SOLN
INTRAMUSCULAR | Status: DC | PRN
  Administered 2014-10-25: 4 mg via INTRAVENOUS

## 2014-10-25 MED ORDER — ROCURONIUM BROMIDE 50 MG/5ML IV SOLN
INTRAVENOUS | Status: AC
  Filled 2014-10-25: qty 1

## 2014-10-25 MED ORDER — THROMBIN 20000 UNITS EX SOLR
CUTANEOUS | Status: DC | PRN
  Administered 2014-10-25: 20 mL via TOPICAL

## 2014-10-25 MED ORDER — DOCUSATE SODIUM 100 MG PO CAPS
100.0000 mg | ORAL_CAPSULE | Freq: Two times a day (BID) | ORAL | Status: DC
  Administered 2014-10-25 – 2014-10-26 (×3): 100 mg via ORAL
  Filled 2014-10-25 (×3): qty 1

## 2014-10-25 MED ORDER — DIAZEPAM 5 MG/ML IJ SOLN
INTRAMUSCULAR | Status: AC
  Filled 2014-10-25: qty 2

## 2014-10-25 MED ORDER — AMLODIPINE BESYLATE 10 MG PO TABS
10.0000 mg | ORAL_TABLET | Freq: Every day | ORAL | Status: DC
  Administered 2014-10-25: 10 mg via ORAL
  Filled 2014-10-25 (×2): qty 1

## 2014-10-25 MED ORDER — BACITRACIN 50000 UNITS IM SOLR
INTRAMUSCULAR | Status: DC | PRN
  Administered 2014-10-25: 500 mL

## 2014-10-25 MED ORDER — LISINOPRIL-HYDROCHLOROTHIAZIDE 20-12.5 MG PO TABS
1.0000 | ORAL_TABLET | Freq: Every morning | ORAL | Status: DC
Start: 2014-10-25 — End: 2014-10-25

## 2014-10-25 MED ORDER — METOPROLOL TARTRATE 12.5 MG HALF TABLET
12.5000 mg | ORAL_TABLET | Freq: Every day | ORAL | Status: DC
  Administered 2014-10-25: 12.5 mg via ORAL
  Filled 2014-10-25 (×2): qty 1

## 2014-10-25 MED ORDER — EPHEDRINE SULFATE 50 MG/ML IJ SOLN
INTRAMUSCULAR | Status: DC | PRN
  Administered 2014-10-25 (×2): 5 mg via INTRAVENOUS
  Administered 2014-10-25: 10 mg via INTRAVENOUS
  Administered 2014-10-25 (×3): 5 mg via INTRAVENOUS
  Administered 2014-10-25: 10 mg via INTRAVENOUS

## 2014-10-25 MED ORDER — PROMETHAZINE HCL 25 MG/ML IJ SOLN: 6.2500 mg | INTRAMUSCULAR | Status: DC | PRN

## 2014-10-25 MED ORDER — LAMOTRIGINE 100 MG PO TABS
200.0000 mg | ORAL_TABLET | Freq: Every morning | ORAL | Status: DC
  Administered 2014-10-25 – 2014-10-26 (×2): 200 mg via ORAL
  Filled 2014-10-25 (×2): qty 2

## 2014-10-25 MED ORDER — ACETAMINOPHEN 650 MG RE SUPP: 650.0000 mg | RECTAL | Status: DC | PRN

## 2014-10-25 MED ORDER — SODIUM CHLORIDE 0.9 % IJ SOLN
INTRAMUSCULAR | Status: AC
  Filled 2014-10-25: qty 10

## 2014-10-25 MED ORDER — SODIUM CHLORIDE 0.9 % IJ SOLN: 3.0000 mL | INTRAMUSCULAR | Status: DC | PRN

## 2014-10-25 MED ORDER — EPHEDRINE SULFATE 50 MG/ML IJ SOLN
INTRAMUSCULAR | Status: AC
  Filled 2014-10-25: qty 1

## 2014-10-25 MED ORDER — BUPIVACAINE HCL (PF) 0.25 % IJ SOLN
INTRAMUSCULAR | Status: DC | PRN
  Administered 2014-10-25: 3 mL

## 2014-10-25 MED ORDER — FENTANYL CITRATE 0.05 MG/ML IJ SOLN
25.0000 ug | INTRAMUSCULAR | Status: DC | PRN
  Administered 2014-10-25 (×2): 50 ug via INTRAVENOUS

## 2014-10-25 MED ORDER — DIAZEPAM 5 MG/ML IJ SOLN
2.5000 mg | Freq: Once | INTRAMUSCULAR | Status: AC
  Administered 2014-10-25: 2.5 mg via INTRAVENOUS

## 2014-10-25 MED ORDER — ONDANSETRON HCL 4 MG/2ML IJ SOLN
INTRAMUSCULAR | Status: AC
  Filled 2014-10-25: qty 2

## 2014-10-25 MED ORDER — NICOTINE 21 MG/24HR TD PT24
21.0000 mg | MEDICATED_PATCH | TRANSDERMAL | Status: DC
  Administered 2014-10-25: 21 mg via TRANSDERMAL
  Filled 2014-10-25: qty 1

## 2014-10-25 MED ORDER — SUCCINYLCHOLINE CHLORIDE 20 MG/ML IJ SOLN
INTRAMUSCULAR | Status: DC | PRN
  Administered 2014-10-25: 140 mg via INTRAVENOUS

## 2014-10-25 MED ORDER — POTASSIUM CHLORIDE IN NACL 20-0.9 MEQ/L-% IV SOLN
INTRAVENOUS | Status: DC
  Administered 2014-10-25: 15:00:00 via INTRAVENOUS
  Filled 2014-10-25: qty 1000

## 2014-10-25 MED ORDER — LACTATED RINGERS IV SOLN
INTRAVENOUS | Status: DC | PRN
  Administered 2014-10-25 (×2): via INTRAVENOUS

## 2014-10-25 MED ORDER — MEPERIDINE HCL 25 MG/ML IJ SOLN: 6.2500 mg | INTRAMUSCULAR | Status: DC | PRN

## 2014-10-25 MED ORDER — PROPOFOL 10 MG/ML IV BOLUS
INTRAVENOUS | Status: AC
  Filled 2014-10-25: qty 20

## 2014-10-25 MED ORDER — FENTANYL CITRATE 0.05 MG/ML IJ SOLN
INTRAMUSCULAR | Status: AC
  Filled 2014-10-25: qty 2

## 2014-10-25 MED ORDER — LISINOPRIL 20 MG PO TABS
20.0000 mg | ORAL_TABLET | Freq: Every day | ORAL | Status: DC
  Administered 2014-10-25: 20 mg via ORAL
  Filled 2014-10-25 (×2): qty 1

## 2014-10-25 MED ORDER — CEFAZOLIN SODIUM 1-5 GM-% IV SOLN
1.0000 g | Freq: Three times a day (TID) | INTRAVENOUS | Status: DC
  Filled 2014-10-25 (×2): qty 50

## 2014-10-25 MED ORDER — ACETAMINOPHEN 325 MG PO TABS: 650.0000 mg | ORAL_TABLET | ORAL | Status: DC | PRN

## 2014-10-25 MED ORDER — GLYCOPYRROLATE 0.2 MG/ML IJ SOLN
INTRAMUSCULAR | Status: AC
  Filled 2014-10-25: qty 1

## 2014-10-25 MED ORDER — NICOTINE 21 MG/24HR TD PT24: 21.0000 mg | MEDICATED_PATCH | Freq: Every day | TRANSDERMAL | Status: DC

## 2014-10-25 MED ORDER — DIVALPROEX SODIUM 500 MG PO DR TAB
2000.0000 mg | DELAYED_RELEASE_TABLET | Freq: Every day | ORAL | Status: DC
  Administered 2014-10-25: 2000 mg via ORAL
  Filled 2014-10-25: qty 4

## 2014-10-25 MED ORDER — LIDOCAINE HCL (CARDIAC) 20 MG/ML IV SOLN
INTRAVENOUS | Status: DC | PRN
  Administered 2014-10-25: 30 mg via INTRAVENOUS

## 2014-10-25 MED ORDER — MIDAZOLAM HCL 2 MG/2ML IJ SOLN
INTRAMUSCULAR | Status: AC
  Filled 2014-10-25: qty 2

## 2014-10-25 MED ORDER — ARTIFICIAL TEARS OP OINT
TOPICAL_OINTMENT | OPHTHALMIC | Status: AC
  Filled 2014-10-25: qty 3.5

## 2014-10-25 MED ORDER — ALBUTEROL SULFATE (2.5 MG/3ML) 0.083% IN NEBU: 3.0000 mL | INHALATION_SOLUTION | Freq: Four times a day (QID) | RESPIRATORY_TRACT | Status: DC | PRN

## 2014-10-25 MED ORDER — VANCOMYCIN HCL IN DEXTROSE 1-5 GM/200ML-% IV SOLN
1000.0000 mg | Freq: Once | INTRAVENOUS | Status: AC
  Administered 2014-10-25: 1000 mg via INTRAVENOUS
  Filled 2014-10-25: qty 200

## 2014-10-25 MED ORDER — THROMBIN 20000 UNITS EX SOLR
CUTANEOUS | Status: DC | PRN
  Administered 2014-10-25: 5 mL via TOPICAL

## 2014-10-25 MED ORDER — PROPOFOL 10 MG/ML IV BOLUS
INTRAVENOUS | Status: DC | PRN
  Administered 2014-10-25: 100 mg via INTRAVENOUS

## 2014-10-25 MED ORDER — HYDROCHLOROTHIAZIDE 12.5 MG PO CAPS
12.5000 mg | ORAL_CAPSULE | Freq: Every day | ORAL | Status: DC
  Administered 2014-10-25: 12.5 mg via ORAL
  Filled 2014-10-25 (×2): qty 1

## 2014-10-25 MED ORDER — PHENYLEPHRINE HCL 10 MG/ML IJ SOLN
INTRAMUSCULAR | Status: DC | PRN
  Administered 2014-10-25 (×6): 40 ug via INTRAVENOUS

## 2014-10-25 MED ORDER — FENTANYL CITRATE 0.05 MG/ML IJ SOLN
INTRAMUSCULAR | Status: AC
  Filled 2014-10-25: qty 5

## 2014-10-25 MED ORDER — DEXAMETHASONE SODIUM PHOSPHATE 4 MG/ML IJ SOLN: 4.0000 mg | Freq: Four times a day (QID) | INTRAMUSCULAR | Status: DC

## 2014-10-25 MED ORDER — SODIUM CHLORIDE 0.9 % IV SOLN: 250.0000 mL | INTRAVENOUS | Status: DC

## 2014-10-25 MED ORDER — SODIUM CHLORIDE 0.9 % IJ SOLN: 3.0000 mL | Freq: Two times a day (BID) | INTRAMUSCULAR | Status: DC

## 2014-10-25 MED ORDER — FENTANYL CITRATE 0.05 MG/ML IJ SOLN
INTRAMUSCULAR | Status: DC | PRN
  Administered 2014-10-25: 50 ug via INTRAVENOUS
  Administered 2014-10-25: 250 ug via INTRAVENOUS

## 2014-10-25 MED ORDER — ONDANSETRON HCL 4 MG/2ML IJ SOLN: 4.0000 mg | INTRAMUSCULAR | Status: DC | PRN

## 2014-10-25 MED ORDER — ARTIFICIAL TEARS OP OINT
TOPICAL_OINTMENT | OPHTHALMIC | Status: DC | PRN
  Administered 2014-10-25: 1 via OPHTHALMIC

## 2014-10-25 MED ORDER — FENTANYL CITRATE 0.05 MG/ML IJ SOLN: 25.0000 ug | INTRAMUSCULAR | Status: DC | PRN

## 2014-10-25 MED ORDER — PHENYLEPHRINE 40 MCG/ML (10ML) SYRINGE FOR IV PUSH (FOR BLOOD PRESSURE SUPPORT)
PREFILLED_SYRINGE | INTRAVENOUS | Status: AC
  Filled 2014-10-25: qty 10

## 2014-10-25 MED ORDER — CYCLOBENZAPRINE HCL 10 MG PO TABS
10.0000 mg | ORAL_TABLET | Freq: Three times a day (TID) | ORAL | Status: DC | PRN
  Administered 2014-10-25 – 2014-10-26 (×3): 10 mg via ORAL
  Filled 2014-10-25 (×4): qty 1

## 2014-10-25 MED ORDER — LIDOCAINE HCL (CARDIAC) 20 MG/ML IV SOLN
INTRAVENOUS | Status: AC
  Filled 2014-10-25: qty 5

## 2014-10-25 MED ORDER — CELECOXIB 200 MG PO CAPS
200.0000 mg | ORAL_CAPSULE | Freq: Two times a day (BID) | ORAL | Status: DC
  Administered 2014-10-25 – 2014-10-26 (×3): 200 mg via ORAL
  Filled 2014-10-25 (×3): qty 1

## 2014-10-25 MED ORDER — PROPOFOL INFUSION 10 MG/ML OPTIME
INTRAVENOUS | Status: DC | PRN
  Administered 2014-10-25: 25 ug/kg/min via INTRAVENOUS

## 2014-10-25 MED ORDER — ALBUTEROL SULFATE HFA 108 (90 BASE) MCG/ACT IN AERS
INHALATION_SPRAY | RESPIRATORY_TRACT | Status: DC | PRN
  Administered 2014-10-25 (×2): 2 via RESPIRATORY_TRACT

## 2014-10-25 MED ORDER — MENTHOL 3 MG MT LOZG: 1.0000 | LOZENGE | OROMUCOSAL | Status: DC | PRN

## 2014-10-25 MED ORDER — PHENOL 1.4 % MT LIQD: 1.0000 | OROMUCOSAL | Status: DC | PRN

## 2014-10-25 MED ORDER — MIDAZOLAM HCL 5 MG/5ML IJ SOLN
INTRAMUSCULAR | Status: DC | PRN
  Administered 2014-10-25: 2 mg via INTRAVENOUS

## 2014-10-25 MED ORDER — MORPHINE SULFATE 2 MG/ML IJ SOLN: 1.0000 mg | INTRAMUSCULAR | Status: DC | PRN

## 2014-10-25 MED ORDER — OXYCODONE-ACETAMINOPHEN 5-325 MG PO TABS
1.0000 | ORAL_TABLET | ORAL | Status: DC | PRN
  Administered 2014-10-25: 2 via ORAL
  Administered 2014-10-25 – 2014-10-26 (×2): 1 via ORAL
  Administered 2014-10-26: 2 via ORAL
  Filled 2014-10-25: qty 2
  Filled 2014-10-25: qty 1
  Filled 2014-10-25: qty 2
  Filled 2014-10-25: qty 1

## 2014-10-25 MED ORDER — MOMETASONE FURO-FORMOTEROL FUM 200-5 MCG/ACT IN AERO
2.0000 | INHALATION_SPRAY | Freq: Two times a day (BID) | RESPIRATORY_TRACT | Status: DC
  Administered 2014-10-25 – 2014-10-26 (×2): 2 via RESPIRATORY_TRACT
  Filled 2014-10-25: qty 8.8

## 2014-10-25 MED ORDER — DEXAMETHASONE 4 MG PO TABS
4.0000 mg | ORAL_TABLET | Freq: Four times a day (QID) | ORAL | Status: DC
  Administered 2014-10-25 – 2014-10-26 (×3): 4 mg via ORAL
  Filled 2014-10-25 (×3): qty 1

## 2014-10-25 MED ORDER — SUCCINYLCHOLINE CHLORIDE 20 MG/ML IJ SOLN
INTRAMUSCULAR | Status: AC
  Filled 2014-10-25: qty 1

## 2014-10-25 MED ORDER — ACAMPROSATE CALCIUM 333 MG PO TBEC
666.0000 mg | DELAYED_RELEASE_TABLET | Freq: Three times a day (TID) | ORAL | Status: DC
  Administered 2014-10-25 (×2): 666 mg via ORAL
  Filled 2014-10-25 (×5): qty 2

## 2014-10-25 SURGICAL SUPPLY — 68 items
BAG DECANTER FOR FLEXI CONT (MISCELLANEOUS) ×2 IMPLANT
BENZOIN TINCTURE PRP APPL 2/3 (GAUZE/BANDAGES/DRESSINGS) ×2 IMPLANT
BLADE CLIPPER SURG (BLADE) IMPLANT
BONE MATRIX OSTEOCEL PRO MED (Bone Implant) ×4 IMPLANT
BUR MATCHSTICK NEURO 3.0 LAGG (BURR) ×2 IMPLANT
CANISTER SUCT 3000ML (MISCELLANEOUS) ×2 IMPLANT
CLIP NEUROVISION LG (CLIP) ×2 IMPLANT
CONT SPEC 4OZ CLIKSEAL STRL BL (MISCELLANEOUS) ×4 IMPLANT
COVER BACK TABLE 24X17X13 BIG (DRAPES) IMPLANT
COVER BACK TABLE 60X90IN (DRAPES) ×2 IMPLANT
DRAPE C-ARM 42X72 X-RAY (DRAPES) ×2 IMPLANT
DRAPE C-ARMOR (DRAPES) ×2 IMPLANT
DRAPE LAPAROTOMY 100X72X124 (DRAPES) ×2 IMPLANT
DRAPE POUCH INSTRU U-SHP 10X18 (DRAPES) ×2 IMPLANT
DRAPE SURG 17X23 STRL (DRAPES) ×2 IMPLANT
DRSG AQUACEL AG ADV 3.5X10 (GAUZE/BANDAGES/DRESSINGS) ×2 IMPLANT
DRSG OPSITE 4X5.5 SM (GAUZE/BANDAGES/DRESSINGS) ×4 IMPLANT
DRSG TELFA 3X8 NADH (GAUZE/BANDAGES/DRESSINGS) ×2 IMPLANT
DURAPREP 26ML APPLICATOR (WOUND CARE) ×2 IMPLANT
ELECT REM PT RETURN 9FT ADLT (ELECTROSURGICAL) ×2
ELECTRODE REM PT RTRN 9FT ADLT (ELECTROSURGICAL) ×1 IMPLANT
EVACUATOR 1/8 PVC DRAIN (DRAIN) ×2 IMPLANT
GAUZE SPONGE 4X4 16PLY XRAY LF (GAUZE/BANDAGES/DRESSINGS) IMPLANT
GLOVE BIO SURGEON STRL SZ8 (GLOVE) ×4 IMPLANT
GLOVE BIO SURGEON STRL SZ8.5 (GLOVE) ×2 IMPLANT
GLOVE BIOGEL PI IND STRL 7.0 (GLOVE) ×2 IMPLANT
GLOVE BIOGEL PI INDICATOR 7.0 (GLOVE) ×2
GLOVE INDICATOR 8.5 STRL (GLOVE) ×4 IMPLANT
GLOVE SS BIOGEL STRL SZ 8 (GLOVE) ×1 IMPLANT
GLOVE SUPERSENSE BIOGEL SZ 8 (GLOVE) ×1
GLOVE SURG SS PI 7.0 STRL IVOR (GLOVE) ×6 IMPLANT
GOWN STRL REUS W/ TWL LRG LVL3 (GOWN DISPOSABLE) IMPLANT
GOWN STRL REUS W/ TWL XL LVL3 (GOWN DISPOSABLE) ×3 IMPLANT
GOWN STRL REUS W/TWL 2XL LVL3 (GOWN DISPOSABLE) IMPLANT
GOWN STRL REUS W/TWL LRG LVL3 (GOWN DISPOSABLE)
GOWN STRL REUS W/TWL XL LVL3 (GOWN DISPOSABLE) ×3
HEMOSTAT POWDER KIT SURGIFOAM (HEMOSTASIS) IMPLANT
HEMOSTAT POWDER SURGIFOAM 1G (HEMOSTASIS) ×2 IMPLANT
KIT BASIN OR (CUSTOM PROCEDURE TRAY) ×2 IMPLANT
KIT DILATOR XLIF 5 (KITS) ×1 IMPLANT
KIT ROOM TURNOVER OR (KITS) ×2 IMPLANT
KIT XLIF (KITS) ×1
MILL MEDIUM DISP (BLADE) ×2 IMPLANT
NEEDLE HYPO 25X1 1.5 SAFETY (NEEDLE) ×2 IMPLANT
NS IRRIG 1000ML POUR BTL (IV SOLUTION) ×2 IMPLANT
PACK LAMINECTOMY NEURO (CUSTOM PROCEDURE TRAY) ×2 IMPLANT
PAD ARMBOARD 7.5X6 YLW CONV (MISCELLANEOUS) ×6 IMPLANT
ROD 35MM (Rod) ×4 IMPLANT
SCREW LOCK (Screw) ×4 IMPLANT
SCREW LOCK FXNS SPNE MAS PL (Screw) ×4 IMPLANT
SCREW MAS PLIF 5.5X30 (Screw) ×2 IMPLANT
SCREW PAS PLIF 5X30 (Screw) ×2 IMPLANT
SCREW SHANK 5.0X30MM (Screw) ×4 IMPLANT
SCREW TULIP 5.5 (Screw) ×4 IMPLANT
SPONGE LAP 4X18 X RAY DECT (DISPOSABLE) IMPLANT
SPONGE SURGIFOAM ABS GEL 100 (HEMOSTASIS) ×4 IMPLANT
STRIP CLOSURE SKIN 1/2X4 (GAUZE/BANDAGES/DRESSINGS) ×4 IMPLANT
SUT VIC AB 0 CT1 18XCR BRD8 (SUTURE) ×1 IMPLANT
SUT VIC AB 0 CT1 8-18 (SUTURE) ×1
SUT VIC AB 2-0 CP2 18 (SUTURE) ×2 IMPLANT
SUT VIC AB 3-0 SH 8-18 (SUTURE) ×4 IMPLANT
SYR 20ML ECCENTRIC (SYRINGE) ×2 IMPLANT
SYR 3ML LL SCALE MARK (SYRINGE) IMPLANT
TOWEL OR 17X24 6PK STRL BLUE (TOWEL DISPOSABLE) ×2 IMPLANT
TOWEL OR 17X26 10 PK STRL BLUE (TOWEL DISPOSABLE) ×2 IMPLANT
TRAY FOLEY CATH 14FRSI W/METER (CATHETERS) IMPLANT
TRAY FOLEY CATH 16FRSI W/METER (SET/KITS/TRAYS/PACK) ×2 IMPLANT
WATER STERILE IRR 1000ML POUR (IV SOLUTION) ×2 IMPLANT

## 2014-10-25 NOTE — Evaluation (Signed)
Physical Therapy Evaluation Patient Details Name: Gregory [name] MRN: [id number] DOB: 1951/06/18 Today's Date: 10/25/2014   History of Present Illness  Patient is a 63 y/o male s/p L4-5 PLIF. PMH of CAD, PNA, RBBB, HLD, aortic stenosis, asthma, bipolar disorder and HTN.  Clinical Impression  Patient presents with functional limitations due to deficits listed in PT problem list (see below). Pt with generalized weakness in BLEs, pain and balance deficits impacting safe mobility. Pt with impaired safety awareness and mild impulsiveness putting pt at increased risk for falls. Education provided on back precautions. Will need to assess ability to negotiate steps next session prior to discharge. Pt would benefit from acute PT to improve transfers, gait, balance and mobility so pt can maximize independence and return to PLOF.    Follow Up Recommendations Home health PT;Supervision/Assistance - 24 hour (pending improvement.)    Equipment Recommendations  None recommended by PT    Recommendations for Other Services       Precautions / Restrictions Precautions Precautions: Back;Fall Precaution Booklet Issued: Yes (comment) Required Braces or Orthoses: Spinal Brace Spinal Brace: Lumbar corset;Applied in sitting position Restrictions Weight Bearing Restrictions: No      Mobility  Bed Mobility Overal bed mobility: Needs Assistance Bed Mobility: Rolling;Sidelying to Sit Rolling: Min guard Sidelying to sit: Min guard;HOB elevated       General bed mobility comments: Use of rails. VC's for log roll technique.   Transfers Overall transfer level: Needs assistance Equipment used: Rolling walker (2 wheeled) Transfers: Sit to/from Stand Sit to Stand: Min guard         General transfer comment: Min guard for safety as pt impulsive. Stood from [employer], from toilet x1 with grab bar for assist. VC's to adhere to back precautions.  Ambulation/Gait Ambulation/Gait assistance: Min  assist Ambulation Distance (Feet): 28 Feet (+150') Assistive device: Rolling walker (2 wheeled) Gait Pattern/deviations: Step-through pattern;Decreased stride length Gait velocity: decreased   General Gait Details: Slow, mildly unsteady gait pattern with VC for upright posture. Pt with downward gaze at times due to impaired sensation bil feet. VC for RW proximity. Impaired safety awareness.   Stairs            Wheelchair Mobility    Modified Rankin (Stroke Patients Only)       Balance Overall balance assessment: Needs assistance Sitting-balance support: Feet supported;No upper extremity supported Sitting balance-Leahy Scale: Fair     Standing balance support: During functional activity Standing balance-Leahy Scale: Fair Standing balance comment: Able to perform static standing without UE support however unsteady and pt reaching for counter/IV pole for balance, requires BUE support on RW for dynamic standing, able to wash hands without LOB.                             Pertinent Vitals/Pain Pain Assessment: 0-10 Pain Score: 4  Pain Location: back Pain Descriptors / Indicators: Sore Pain Intervention(s): Limited activity within patient's tolerance;Monitored during session;Repositioned    Home Living Family/patient expects to be discharged to:: Private residence Living Arrangements: Spouse/significant other Available Help at Discharge: Family;Available 24 hours/day Type of Home: House Home Access: Stairs to enter Entrance Stairs-Rails: Right Entrance Stairs-Number of Steps: 4 Home Layout: One level Home Equipment: Cane - single point;Walker - 2 wheels      Prior Function Level of Independence: Independent         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     Hand Dominance  Extremity/Trunk Assessment   Upper Extremity Assessment: Defer to OT evaluation           Lower Extremity Assessment: Generalized weakness;RLE deficits/detail;LLE  deficits/detail         Communication   Communication: No difficulties  Cognition Arousal/Alertness: Awake/alert Behavior During Therapy: WFL for tasks assessed/performed;Impulsive Overall Cognitive Status: Within Functional Limits for tasks assessed                      General Comments General comments (skin integrity, edema, etc.): Surgical site-clean, dry and intact.     Exercises        Assessment/Plan    PT Assessment Patient needs continued PT services  PT Diagnosis Generalized weakness;Acute pain;Difficulty walking   PT Problem List Decreased strength;Pain;Decreased activity tolerance;Decreased balance;Decreased knowledge of use of DME;Decreased safety awareness;Decreased mobility;Decreased knowledge of precautions  PT Treatment Interventions Balance training;Gait training;DME instruction;Stair training;Patient/family education;Functional mobility training;Therapeutic exercise;Therapeutic activities   PT Goals (Current goals can be found in the Care Plan section) Acute Rehab PT Goals Patient Stated Goal: to be able to chase my grandkids around PT Goal Formulation: With patient Time For Goal Achievement: 11/08/14 Potential to Achieve Goals: Good    Frequency Min 5X/week   Barriers to discharge        Co-evaluation               End of Session Equipment Utilized During Treatment: Gait belt Activity Tolerance: Patient tolerated treatment well Patient left: in bed;with call bell/phone within reach;with bed alarm set (Sitting EOB eating awaiting OT arrival.) Nurse Communication: Mobility status;Precautions         Time: 1500-1540 PT Time Calculation (min): 40 min   Charges:   PT Evaluation $Initial PT Evaluation Tier I: 1 Procedure PT Treatments $Gait Training: 8-22 mins $Self Care/Home Management: 8-22   PT G CodesCandy Sledge A 10/25/2014, 3:51 PM  Candy Sledge, [place], DPT [phone]

## 2014-10-25 NOTE — Anesthesia Procedure Notes (Signed)
Procedure Name: Intubation Date/Time: 10/25/2014 8:37 AM Performed by: Jenne Campus Pre-anesthesia Checklist: Patient identified, Emergency Drugs available, Suction available, Patient being monitored and Timeout performed Patient Re-evaluated:Patient Re-evaluated prior to inductionOxygen Delivery Method: Circle system utilized Preoxygenation: Pre-oxygenation with 100% oxygen Intubation Type: IV induction Ventilation: Mask ventilation without difficulty Tube type: Oral Tube size: 7.5 mm Number of attempts: 1 Airway Equipment and Method: Stylet and Video-laryngoscopy Placement Confirmation: positive ETCO2,  CO2 detector and breath sounds checked- equal and bilateral Secured at: 21 cm Tube secured with: Tape Dental Injury: Teeth and Oropharynx as per pre-operative assessment

## 2014-10-25 NOTE — Progress Notes (Signed)
Occupational Therapy Evaluation Patient Details Name: Gregory [name] MRN: [id number] DOB: 1951-04-06 Today's Date: 10/25/2014    History of Present Illness Patient is a 63 y/o male s/p L4-5 PLIF. PMH of CAD, PNA, RBBB, HLD, aortic stenosis, asthma, bipolar disorder and HTN.   Clinical Impression   PTA pt lived at home with his wife and was independent with ADLs. Pt currently limited by pain and numbness in Bil LEs as well as back precautions, which impair his independence with ADLs. Pt will benefit from acute OT to address functional mobility and independence with ADLs prior to d/c home.     Follow Up Recommendations  No OT follow up;Supervision - Intermittent    Equipment Recommendations  None recommended by OT    Recommendations for Other Services       Precautions / Restrictions Precautions Precautions: Back;Fall Precaution Booklet Issued: Yes (comment) Precaution Comments: Educated pt on 3/3 back precautions and incorporating into ADLs. Pt able to independently recall 3/3 with increased time.  Required Braces or Orthoses: Spinal Brace Spinal Brace: Lumbar corset;Applied in sitting position Restrictions Weight Bearing Restrictions: No      Mobility Bed Mobility Overal bed mobility: Needs Assistance Bed Mobility: Rolling;Sidelying to Sit Rolling: Min guard Sidelying to sit: Min guard;HOB elevated       General bed mobility comments: pt sitting up on EOB when OT arrived  Transfers Overall transfer level: Needs assistance Equipment used: Rolling walker (2 wheeled) Transfers: Sit to/from Stand Sit to Stand: Min guard         General transfer comment: Min guard for safety. Pt is somewhat impulsive and requires VC's to wait or slow down.     Balance Overall balance assessment: Needs assistance Sitting-balance support: Feet supported;No upper extremity supported Sitting balance-Leahy Scale: Fair     Standing balance support: During functional  activity Standing balance-Leahy Scale: Fair Standing balance comment: Able to perform static standing without UE support however unsteady and pt reaching for counter/IV pole for balance, requires BUE support on RW for dynamic standing, able to wash hands without LOB.                            ADL Overall ADL's : Needs assistance/impaired Eating/Feeding: Independent;Sitting   Grooming: Min guard;Standing   Upper Body Bathing: Set up;Sitting   Lower Body Bathing: Minimal assistance;Sit to/from stand   Upper Body Dressing : Set up;Sitting   Lower Body Dressing: Moderate assistance;Sit to/from stand   Toilet Transfer: Min guard;Ambulation     Toileting - Clothing Manipulation Details (indicate cue type and reason): educated pt on use of tongs and baby wipes to increase independence with toilet hygiene while maintaining back precautions.     Functional mobility during ADLs: Min guard;Rolling walker General ADL Comments: Pt's wife underwent similar surgery last year and is familiar with precautions and compensatory techniques for ADLs. Educated pt on incorporating back precautions into ADLs.      Vision  Pt wears glasses at all times and reports no change from baseline.                    Perception Perception Perception Tested?: No   Praxis Praxis Praxis tested?: Within functional limits    Pertinent Vitals/Pain Pain Assessment: 0-10 Pain Score: 4  Pain Location: back, surgical Pain Descriptors / Indicators: Sore Pain Intervention(s): Limited activity within patient's tolerance;Monitored during session     Hand Dominance Right   Extremity/Trunk Assessment Upper  Extremity Assessment Upper Extremity Assessment: Overall WFL for tasks assessed   Lower Extremity Assessment Lower Extremity Assessment: Defer to PT evaluation RLE Sensation: decreased light touch LLE Sensation: decreased light touch   Cervical / Trunk Assessment Cervical / Trunk  Assessment: Normal   Communication Communication Communication: No difficulties   Cognition Arousal/Alertness: Awake/alert Behavior During Therapy: WFL for tasks assessed/performed;Impulsive Overall Cognitive Status: Within Functional Limits for tasks assessed                                Home Living Family/patient expects to be discharged to:: Private residence Living Arrangements: Spouse/significant other Available Help at Discharge: Family;Available 24 hours/day Type of Home: House Home Access: Stairs to enter [employer] of Steps: 4 Entrance Stairs-Rails: Right Home Layout: One level     Bathroom Shower/Tub: Tub/shower unit Shower/tub characteristics: Curtain Biochemist, clinical: Standard     Home Equipment: Cane - single point;Walker - 2 wheels          Prior Functioning/Environment Level of Independence: Independent        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    OT Diagnosis: Generalized weakness;Acute pain   OT Problem List: Decreased strength;Decreased range of motion;Impaired balance (sitting and/or standing);Decreased safety awareness;Decreased knowledge of use of DME or AE;Decreased knowledge of precautions;Pain   OT Treatment/Interventions: Self-care/ADL training;Therapeutic exercise;Energy conservation;DME and/or AE instruction;Therapeutic activities;Patient/family education;Balance training    OT Goals(Current goals can be found in the care plan section) Acute Rehab OT Goals Patient Stated Goal: to go on vacation with my wife and walk around OT Goal Formulation: With patient Time For Goal Achievement: 11/08/14 Potential to Achieve Goals: Good  OT Frequency: Min 2X/week    End of Session Equipment Utilized During Treatment: Rolling walker;Back brace  Activity Tolerance: Patient tolerated treatment well Patient left: Other (comment);with call bell/phone within reach;with family/visitor present   Time: 0086-7619 OT Time Calculation  (min): 34 min Charges:  OT General Charges $OT Visit: 1 Procedure OT Evaluation $Initial OT Evaluation Tier I: 1 Procedure OT Treatments $Self Care/Home Management : 23-37 mins  Juluis Rainier 10/25/2014, 5:05 PM   Cyndie Chime, OTR/L Occupational Therapist [phone] (pager)

## 2014-10-25 NOTE — H&P (Signed)
Subjective: Patient is a 63 y.o. male admitted for PLIF L4-5. Onset of symptoms was several months ago, gradually worsening since that time.  The pain is rated severe, and is located at the across the lower back and radiates to legs. The pain is described as aching and occurs all day. The symptoms have been progressive. Symptoms are exacerbated by exercise. MRI or CT showed stenosis/ spondylolisthesis   Past Medical History  Diagnosis Date  . Allergy   . Hypertension   . Bipolar disorder   . Alcohol abuse   . Tobacco use disorder   . Asthma   . Aortic stenosis   . Neuromuscular disorder   . Family history of skin cancer   . Hyperlipidemia   . RBBB     a. 7.2012 low risk nuclear exam, sm area of apical ischemia.  . Carotid artery occlusion   . Claudication   . Smoker   . Chronic back pain   . Heart murmur     slight  . Pneumonia     hx of  . Sleep apnea     does not wear CPAP  . CAD (coronary artery disease)     coronary calcifications 08/2013 CTA  . Spinal stenosis at L4-L5 level     Past Surgical History  Procedure Laterality Date  . Colonoscopy  2009  . Vasectomy reversal    . US echocardiography  06/20/2010    EF 55-60%  . Anal fistulectomy  09/25/11  . Vasectomy      X 2  . Elbow surgery      bilaterally for cubital tunnel  . Tonsillectomy  age 21    Prior to Admission medications   Medication Sig Start Date End Date Taking? Authorizing Provider  acamprosate (CAMPRAL) 333 MG tablet Take 666 mg by mouth 3 (three) times daily.   Yes Historical Provider, MD  albuterol (PROVENTIL HFA;VENTOLIN HFA) 108 (90 BASE) MCG/ACT inhaler Inhale 2 puffs into the lungs every 6 (six) hours as needed for wheezing.   Yes Historical Provider, MD  amLODipine (NORVASC) 10 MG tablet Take 1 tablet (10 mg total) by mouth daily. 09/04/14  Yes [name], MD  aspirin EC 81 MG tablet Take 1 tablet (81 mg total) by mouth daily. 09/01/13  Yes Oswald Hillock, MD  atorvastatin (LIPITOR) 20 MG tablet  Take 1 tablet (20 mg total) by mouth daily. 06/30/14  Yes [name], MD  cyclobenzaprine (FLEXERIL) 10 MG tablet Take 1 tablet by mouth every 8 (eight) hours. 10/13/14  Yes Historical Provider, MD  divalproex (DEPAKOTE) 500 MG DR tablet Take 4 tablets (2,000 mg total) by mouth at bedtime. 11/29/12  Yes [name], MD  fluticasone [facility]) 50 MCG/ACT nasal spray Place 2 sprays into the nose daily. 11/29/12  Yes [name], MD  Fluticasone-Salmeterol (ADVAIR DISKUS) 500-50 MCG/DOSE AEPB Inhale 1 puff into the lungs every 12 (twelve) hours. 11/29/12  Yes [name], MD  lisinopril-hydrochlorothiazide (PRINZIDE,ZESTORETIC) 20-12.5 MG per tablet Take 1 tablet by mouth every morning. 08/21/14  Yes [name], MD  metoprolol tartrate (LOPRESSOR) 25 MG tablet Take 0.5 tablets (12.5 mg total) by mouth daily. 10/19/14  Yes [name] [name], MD  MULTIPLE VITAMIN PO Take 2 tablets by mouth daily.   Yes Historical Provider, MD  Tetrahydrozoline HCl (VISINE OP) Place 2 drops into both eyes as needed (for dry eyes).   Yes Historical Provider, MD  lamoTRIgine (LAMICTAL) 200 MG tablet Take 1 tablet (  200 mg total) by mouth every morning. 01/05/14   [name], MD   Allergies  Allergen Reactions  . Codeine Anaphylaxis    History  Substance Use Topics  . Smoking status: Current Every Day Smoker -- 1.00 packs/day for 15 years    Types: Cigarettes  . Smokeless tobacco: Never Used  . Alcohol Use: 44.0 oz/week    50 Glasses of wine, 28 Not specified per week     Comment: 2 glasses of wine per night.    Family History  Problem Relation Age of Onset  . Stroke Father 43  . Cancer Father     skin  . Drug abuse Father   . Hypertension Brother   . Diabetes Brother   . Drug abuse Brother   . Cancer Maternal Aunt     skin  . Cancer Maternal Grandmother     liver     Review of Systems  Positive ROS: neg  All other systems have been reviewed and were otherwise negative with the exception  of those mentioned in the HPI and as above.  Objective: Vital signs in last 24 hours: Temp:  [96.7 F (35.9 C)] 96.7 F (35.9 C) (11/04 0646) Pulse Rate:  [52] 52 (11/04 0646) Resp:  [18] 18 (11/04 0646) BP: (125)/(64) 125/64 mmHg (11/04 0646) Weight:  [142 lb (64.411 kg)] 142 lb (64.411 kg) (11/04 0653)  General Appearance: Alert, cooperative, no distress, appears stated age Head: Normocephalic, without obvious abnormality, atraumatic Eyes: PERRL, conjunctiva/corneas clear, EOM's intact    Neck: Supple, symmetrical, trachea midline Back: Symmetric, no curvature, ROM normal, no CVA tenderness Lungs:  respirations unlabored Heart: Regular rate and rhythm Abdomen: Soft, non-tender Extremities: Extremities normal, atraumatic, no cyanosis or edema Pulses: 2+ and symmetric all extremities Skin: Skin color, texture, turgor normal, no rashes or lesions  NEUROLOGIC:   Mental status: Alert and oriented x4,  no aphasia, good attention span, fund of knowledge, and memory Motor Exam - grossly normal Sensory Exam - grossly normal Reflexes: 1+ Coordination - grossly normal Gait - grossly normal Balance - grossly normal Cranial Nerves: I: smell Not tested  II: visual acuity  OS: nl    OD: nl  II: visual fields Full to confrontation  II: pupils Equal, round, reactive to light  III,VII: ptosis None  III,IV,VI: extraocular muscles  Full ROM  V: mastication Normal  V: facial light touch sensation  Normal  V,VII: corneal reflex  Present  VII: facial muscle function - upper  Normal  VII: facial muscle function - lower Normal  VIII: hearing Not tested  IX: soft palate elevation  Normal  IX,X: gag reflex Present  XI: trapezius strength  5/5  XI: sternocleidomastoid strength 5/5  XI: neck flexion strength  5/5  XII: tongue strength  Normal    Data Review Lab Results  Component Value Date   WBC 5.9 10/17/2014   HGB 14.7 10/17/2014   HCT 42.5 10/17/2014   MCV 90.8 10/17/2014   PLT  159 10/17/2014   Lab Results  Component Value Date   NA 141 10/17/2014   K 4.2 10/17/2014   CL 100 10/17/2014   CO2 28 10/17/2014   BUN 12 10/17/2014   CREATININE 0.73 10/17/2014   GLUCOSE 95 10/17/2014   Lab Results  Component Value Date   INR 1.01 10/17/2014    Assessment/Plan: Patient admitted for PLIF L4-5. Patient has failed a reasonable attempt at conservative therapy.  I explained the condition and procedure to the patient  and answered any questions.  Patient wishes to proceed with procedure as planned. Understands risks/ benefits and typical outcomes of procedure.   Jaccob Czaplicki S 10/25/2014 8:16 AM

## 2014-10-25 NOTE — Transfer of Care (Signed)
Immediate Anesthesia Transfer of Care Note  Patient: Gregory [name]  Procedure(s) Performed: Procedure(s): LUMBAR FOUR TO FIVE MAXIMUM ACCESS (MAS) POSTERIOR LUMBAR INTERBODY FUSION (PLIF) 1 LEVEL (N/A)  Patient Location: PACU  Anesthesia Type:General  Level of Consciousness: awake, oriented and patient cooperative  Airway & Oxygen Therapy: Patient Spontanous Breathing and Patient connected to face mask oxygen  Post-op Assessment: Report given to PACU RN and Post -op Vital signs reviewed and stable  Post vital signs: Reviewed  Complications: No apparent anesthesia complications

## 2014-10-25 NOTE — Anesthesia Postprocedure Evaluation (Signed)
  Anesthesia Post-op Note  Patient: Gregory [name]  Procedure(s) Performed: Procedure(s): LUMBAR FOUR TO FIVE MAXIMUM ACCESS (MAS) POSTERIOR LUMBAR INTERBODY FUSION (PLIF) 1 LEVEL (N/A)  Patient Location: PACU  Anesthesia Type:General  Level of Consciousness: awake, alert , oriented and patient cooperative  Airway and Oxygen Therapy: Patient Spontanous Breathing  Post-op Pain: mild  Post-op Assessment: Post-op Vital signs reviewed, Patient's Cardiovascular Status Stable, Respiratory Function Stable, Patent Airway, No signs of Nausea or vomiting and Pain level controlled  Post-op Vital Signs: Reviewed and stable  Last Vitals:  Filed Vitals:   10/25/14 1241  BP: 141/106  Pulse:   Temp: 36.5 C  Resp: 18    Complications: No apparent anesthesia complications

## 2014-10-25 NOTE — Op Note (Signed)
10/25/2014  11:11 AM  PATIENT:  Gregory [name]  63 y.o. male  PRE-OPERATIVE DIAGNOSIS:  Grade 1 spondylolisthesis with severe spinal stenosis L4-5 with back and leg pain  POST-OPERATIVE DIAGNOSIS:  same  PROCEDURE:   1. Decompressive lumbar laminectomy L4-5 requiring more work than would be required for a simple exposure of the disk for PLIF in order to adequately decompress the neural elements and address the spinal stenosis 2. Posterior lumbar interbody fusion L4-5 using PEEK interbody cages packed with morcellized allograft and autograft 3. Posterior fixation L4-5 using Nuvasive cortical pedicle screws.    SURGEON:  Sherley Bounds, MD  ASSISTANTS: Dr. Arnoldo Morale  ANESTHESIA:  General  EBL: 30 ml  Total I/O In: 1000 [I.V.:1000] Out: 165 [Urine:135; Blood:30]  BLOOD ADMINISTERED:none  DRAINS: Hemovac   INDICATION FOR PROCEDURE: this patient presented with a long history of back and bilateral leg pain with ambulation. Had an MRI which showed severe spinal stenosis at L4-5 with severe facet arthropathy and a grade 1 spondylolisthesis. He tried medical management without relief. I recommended a decompression and instrumented fusion to address his segmental instability and spinal stenosis. Patient understood the risks, benefits, and alternatives and potential outcomes and wished to proceed.  PROCEDURE DETAILS:  The patient was brought to the operating room. After induction of generalized endotracheal anesthesia the patient was rolled into the prone position on chest rolls and all pressure points were padded. The patient's lumbar region was cleaned and then prepped with DuraPrep and draped in the usual sterile fashion. Anesthesia was injected and then a dorsal midline incision was made and carried down to the lumbosacral fascia. The fascia was opened and the paraspinous musculature was taken down in a subperiosteal fashion to expose L4-5. A self-retaining retractor was placed.  Intraoperative fluoroscopy confirmed my level, and I started with placement of the L4 cortical pedicle screws. The pedicle screw entry zones were identified utilizing surface landmarks and  AP and lateral fluoroscopy. I scored the cortex with the high-speed drill and then used the hand drill and EMG monitoring to drill an upward and outward direction into the pedicle. I then tapped line to line, and the tap was also monitored. I then placed a 5-0 x 30 mm cortical pedicle screw into the pedicles of L4 bilaterally. I then turned my attention to the decompression and the spinous process was removed and complete lumbar laminectomies, hemi- facetectomies, and foraminotomies were performed at L4-5. The patient had significant spinal stenosis and this required more work than would be required for a simple exposure of the disc for posterior lumbar interbody fusion. Much more generous decompression was undertaken in order to adequately decompress the neural elements and address the patient's leg pain. The yellow ligament was removed to expose the underlying dura and nerve roots, and generous foraminotomies were performed to adequately decompress the neural elements. Both the exiting and traversing nerve roots were decompressed on both sides until a coronary dilator passed easily along the nerve roots. Once the decompression was complete, I turned my attention to the posterior lower lumbar interbody fusion. The epidural venous vasculature was coagulated and cut sharply. Disc space was incised and the initial discectomy was performed with pituitary rongeurs. The disc space was distracted with sequential distractors to a height of 9 mm. We then used a series of scrapers and shavers to prepare the endplates for fusion. The midline was prepared with Epstein curettes. Once the complete discectomy was finished, we packed an appropriate sized peek interbody cage with local  autograft and morcellized allograft, gently retracted the  nerve root, and tapped the cage into position at L4-5.  The midline between the cages was packed with morselized autograft and allograft. We then turned our attention to the placement of the lower pedicle screws. The pedicle screw entry zones were identified utilizing surface landmarks and fluoroscopy. I drilled into each pedicle utilizing the hand drill and EMG monitoring, and tapped each pedicle with the appropriate tap. We palpated with a ball probe to assure no break in the cortex. We then placed 5-0 x 30 mm cortical pedicle screws into the pedicles bilaterally at L5.  We then placed lordotic rods into the multiaxial screw heads of the pedicle screws and locked these in position with the locking caps and anti-torque device. We then checked our construct with AP and lateral fluoroscopy. Irrigated with copious amounts of bacitracin-containing saline solution. Placed a medium Hemovac drain through separate stab incision. Inspected the nerve roots once again to assure adequate decompression, lined to the dura with Gelfoam, and closed the muscle and the fascia with 0 Vicryl. Closed the subcutaneous tissues with 2-0 Vicryl and subcuticular tissues with 3-0 Vicryl. The skin was closed with benzoin and Steri-Strips. Dressing was then applied, the patient was awakened from general anesthesia and transported to the recovery room in stable condition. At the end of the procedure all sponge, needle and instrument counts were correct.   PLAN OF CARE: Admit to inpatient   PATIENT DISPOSITION:  PACU - hemodynamically stable.   Delay start of Pharmacological VTE agent (>24hrs) due to surgical blood loss or risk of bleeding:  yes

## 2014-10-26 ENCOUNTER — Encounter (HOSPITAL_COMMUNITY): Payer: Self-pay | Admitting: Neurological Surgery

## 2014-10-26 MED ORDER — OXYCODONE-ACETAMINOPHEN 5-325 MG PO TABS: 1.0000 | ORAL_TABLET | ORAL | Status: DC | PRN

## 2014-10-26 MED ORDER — CYCLOBENZAPRINE HCL 10 MG PO TABS: 10.0000 mg | ORAL_TABLET | Freq: Three times a day (TID) | ORAL | Status: DC

## 2014-10-26 NOTE — Progress Notes (Signed)
Patient Gregory [name] home via car with wife.  DC instructions and prescription given to patient.  Both are fully understood.  Vital signs and assessments were stable.

## 2014-10-26 NOTE — Progress Notes (Signed)
[facility] called for rolling walker to be delivered to the patient's room prior to discharge home today; Aneta Mins [phone]

## 2014-10-26 NOTE — Discharge Summary (Signed)
Physician Discharge Summary  Patient ID: Gregory [name] MRN: [id number] DOB/AGE: 64-30-1952 64 y.o.  Admit date: 10/25/2014 Discharge date: 10/26/2014  Admission Diagnoses: stenosis/ spondylolisthesis    Discharge Diagnoses: same   Discharged Condition: good  Hospital Course: The patient was admitted on 10/25/2014 and taken to the operating room where the patient underwent PLIF L4-5. The patient tolerated the procedure well and was taken to the recovery room and then to the floor in stable condition. The hospital course was routine. There were no complications. The wound remained clean dry and intact. Pt had appropriate back soreness. No complaints of leg pain or new N/T/W. The patient remained afebrile with stable vital signs, and tolerated a regular diet. The patient continued to increase activities, and pain was well controlled with oral pain medications.   Consults: None  Significant Diagnostic Studies:  Results for orders placed or performed during the hospital encounter of 10/17/14  Surgical pcr screen  Result Value Ref Range   MRSA, PCR NEGATIVE NEGATIVE   Staphylococcus aureus NEGATIVE NEGATIVE  APTT  Result Value Ref Range   aPTT 41 (H) 24 - 37 seconds  Comprehensive metabolic panel  Result Value Ref Range   Sodium 141 137 - 147 mEq/L   Potassium 4.2 3.7 - 5.3 mEq/L   Chloride 100 96 - 112 mEq/L   CO2 28 19 - 32 mEq/L   Glucose, Bld 95 70 - 99 mg/dL   BUN 12 6 - 23 mg/dL   Creatinine, Ser 0.73 0.50 - 1.35 mg/dL   Calcium 9.5 8.4 - 10.5 mg/dL   Total Protein 8.1 6.0 - 8.3 g/dL   Albumin 4.0 3.5 - 5.2 g/dL   AST 27 0 - 37 U/L   ALT 27 0 - 53 U/L   Alkaline Phosphatase 75 39 - 117 U/L   Total Bilirubin 0.2 (L) 0.3 - 1.2 mg/dL   GFR calc non Af Amer >90 >90 mL/min   GFR calc Af Amer >90 >90 mL/min   Anion gap 13 5 - 15  CBC WITH DIFFERENTIAL  Result Value Ref Range   WBC 5.9 4.0 - 10.5 K/uL   RBC 4.68 4.22 - 5.81 MIL/uL   Hemoglobin 14.7 13.0 - 17.0 g/dL   HCT  42.5 39.0 - 52.0 %   MCV 90.8 78.0 - 100.0 fL   MCH 31.4 26.0 - 34.0 pg   MCHC 34.6 30.0 - 36.0 g/dL   RDW 13.3 11.5 - 15.5 %   Platelets 159 150 - 400 K/uL   Neutrophils Relative % 48 43 - 77 %   Neutro Abs 2.9 1.7 - 7.7 K/uL   Lymphocytes Relative 36 12 - 46 %   Lymphs Abs 2.1 0.7 - 4.0 K/uL   Monocytes Relative 13 (H) 3 - 12 %   Monocytes Absolute 0.8 0.1 - 1.0 K/uL   Eosinophils Relative 2 0 - 5 %   Eosinophils Absolute 0.1 0.0 - 0.7 K/uL   Basophils Relative 1 0 - 1 %   Basophils Absolute 0.0 0.0 - 0.1 K/uL  Protime-INR  Result Value Ref Range   Prothrombin Time 13.4 11.6 - 15.2 seconds   INR 1.01 0.00 - 1.49  Type and screen  Result Value Ref Range   ABO/RH(D) AB POS    Antibody Screen NEG    Sample Expiration 10/31/2014   ABO/Rh  Result Value Ref Range   ABO/RH(D) AB POS     Dg Lumbar Spine 2-3 Views  10/25/2014   CLINICAL DATA:  Lumbar fusion.  Spondylolisthesis.  EXAM: DG C-ARM 61-120 MIN; LUMBAR SPINE - 2-3 VIEW  COMPARISON:  Lumbar MRI 09/02/2014  FINDINGS: Pedicle screw and interbody fusion L4-5 in satisfactory position. Grade 1 anterior slip L4-5 has improved slightly from the MRI.  IMPRESSION: Satisfactory hardware position of PLIF at L4-5.   Electronically Signed   By: Franchot Gallo M.D.   On: 10/25/2014 11:03   Dg C-arm 1-60 Min  10/25/2014   CLINICAL DATA:  Lumbar fusion.  Spondylolisthesis.  EXAM: DG C-ARM 61-120 MIN; LUMBAR SPINE - 2-3 VIEW  COMPARISON:  Lumbar MRI 09/02/2014  FINDINGS: Pedicle screw and interbody fusion L4-5 in satisfactory position. Grade 1 anterior slip L4-5 has improved slightly from the MRI.  IMPRESSION: Satisfactory hardware position of PLIF at L4-5.   Electronically Signed   By: Franchot Gallo M.D.   On: 10/25/2014 11:03    Antibiotics:  Anti-infectives    Start     Dose/Rate Route Frequency Ordered Stop   10/25/14 2030  vancomycin (VANCOCIN) IVPB 1000 mg/200 mL premix     1,000 mg200 mL/hr over 60 Minutes Intravenous  Once 10/25/14  1353 10/25/14 2242   10/25/14 1430  ceFAZolin (ANCEF) IVPB 1 g/50 mL premix  Status:  Discontinued     1 g100 mL/hr over 30 Minutes Intravenous Every 8 hours 10/25/14 1326 10/25/14 1351   10/25/14 0953  bacitracin 50,000 Units in sodium chloride irrigation 0.9 % 500 mL irrigation  Status:  Discontinued       As needed 10/25/14 0954 10/25/14 1127   10/25/14 0600  vancomycin (VANCOCIN) IVPB 1000 mg/200 mL premix     1,000 mg200 mL/hr over 60 Minutes Intravenous On call to O.R. 10/24/14 1347 10/25/14 0925      Discharge Exam: Blood pressure 128/57, pulse 65, temperature 97.7 F (36.5 C), temperature source Oral, resp. rate 16, height 5\' 5"  (1.651 m), weight 142 lb 8 oz (64.638 kg), SpO2 96 %. Neurologic: Grossly normal Incision dry  Discharge Medications:     Medication List    TAKE these medications        acamprosate 333 MG tablet  Commonly known as:  CAMPRAL  Take 666 mg by mouth 3 (three) times daily.     albuterol 108 (90 BASE) MCG/ACT inhaler  Commonly known as:  PROVENTIL HFA;VENTOLIN HFA  Inhale 2 puffs into the lungs every 6 (six) hours as needed for wheezing.     amLODipine 10 MG tablet  Commonly known as:  NORVASC  Take 1 tablet (10 mg total) by mouth daily.     aspirin EC 81 MG tablet  Take 1 tablet (81 mg total) by mouth daily.     atorvastatin 20 MG tablet  Commonly known as:  LIPITOR  Take 1 tablet (20 mg total) by mouth daily.     cyclobenzaprine 10 MG tablet  Commonly known as:  FLEXERIL  Take 1 tablet (10 mg total) by mouth every 8 (eight) hours.     divalproex 500 MG DR tablet  Commonly known as:  DEPAKOTE  Take 4 tablets (2,000 mg total) by mouth at bedtime.     fluticasone 50 MCG/ACT nasal spray  Commonly known as:  FLONASE  Place 2 sprays into the nose daily.     Fluticasone-Salmeterol 500-50 MCG/DOSE Aepb  Commonly known as:  ADVAIR DISKUS  Inhale 1 puff into the lungs every 12 (twelve) hours.     lamoTRIgine 200 MG tablet  Commonly known  as:  LAMICTAL  Take  1 tablet (200 mg total) by mouth every morning.     lisinopril-hydrochlorothiazide 20-12.5 MG per tablet  Commonly known as:  PRINZIDE,ZESTORETIC  Take 1 tablet by mouth every morning.     metoprolol tartrate 25 MG tablet  Commonly known as:  LOPRESSOR  Take 0.5 tablets (12.5 mg total) by mouth daily.     MULTIPLE VITAMIN PO  Take 2 tablets by mouth daily.     oxyCODONE-acetaminophen 5-325 MG per tablet  Commonly known as:  PERCOCET/ROXICET  Take 1 tablet by mouth every 4 (four) hours as needed for moderate pain.     VISINE OP  Place 2 drops into both eyes as needed (for dry eyes).        Disposition: home   Final Dx: PLIF L4-5      Discharge Instructions     Remove dressing in 72 hours    Complete by:  As directed      Call MD for:  difficulty breathing, headache or visual disturbances    Complete by:  As directed      Call MD for:  persistant nausea and vomiting    Complete by:  As directed      Call MD for:  redness, tenderness, or signs of infection (pain, swelling, redness, odor or green/yellow discharge around incision site)    Complete by:  As directed      Call MD for:  severe uncontrolled pain    Complete by:  As directed      Call MD for:  temperature >100.4    Complete by:  As directed      Diet - low sodium heart healthy    Complete by:  As directed      Discharge instructions    Complete by:  As directed   May shower, no driving, no lifting, no bending or twisting     Increase activity slowly    Complete by:  As directed            Follow-up Information    Follow up with JONES,DAVID S, MD In 2 weeks.   Specialty:  Neurosurgery   Contact information:   [address] [place] [address] [phone]        Signed: Eustace Moore 10/26/2014, 7:30 AM

## 2014-10-26 NOTE — Progress Notes (Signed)
Occupational Therapy Treatment Patient Details Name: Gregory [name] MRN: [id number] DOB: January 19, 1951 Today's Date: 10/26/2014    History of present illness Patient is a 63 y/o male s/p L4-5 PLIF. PMH of CAD, PNA, RBBB, HLD, aortic stenosis, asthma, bipolar disorder and HTN.   OT comments  Pt seen today for ADLs and functional mobility. Pt able to recall back precautions and dressed with Supervision from OT. VC's for safety and with sequencing with back brace. Pt is safe for d/c from OT standpoint.   Follow Up Recommendations  No OT follow up;Supervision - Intermittent    Equipment Recommendations  None recommended by OT    Recommendations for Other Services      Precautions / Restrictions Precautions Precautions: Back;Fall Precaution Booklet Issued: Yes (comment) Precaution Comments: Educated pt on 3/3 back precautions and incorporating into ADLs. Pt able to independently recall 3/3 with increased time.  Required Braces or Orthoses: Spinal Brace Spinal Brace: Lumbar corset;Applied in sitting position Restrictions Weight Bearing Restrictions: No       Mobility Bed Mobility               General bed mobility comments: pt standing in room when OT arrived  Transfers Overall transfer level: Needs assistance Equipment used: None Transfers: Sit to/from Stand Sit to Stand: Supervision         General transfer comment: Supervision for safety        ADL Overall ADL's : Needs assistance/impaired                 Upper Body Dressing : Set up;Sitting Upper Body Dressing Details (indicate cue type and reason): VC's and setup for back brace Lower Body Dressing: Sit to/from stand;Supervision/safety;Set up Lower Body Dressing Details (indicate cue type and reason): pt able to don underwear and pants with Supervision for precautions. Pt plans to wear slip on shoes (with a back) and has no difficulty with these. Toilet Transfer: Supervision/safety;Ambulation            Functional mobility during ADLs: Supervision/safety General ADL Comments: Pt progressing with mobility and ADLs, however requires VC's to move slowly as he tends to be slightly impulsive. Pt will have wife's assistance at home.                 Cognition  Arousal/Alertness: Awake/Alert Behavior During Therapy: WFL for tasks assessed/performed;Impulsive Overall Cognitive Status: Within Functional Limits for tasks assessed                                    Pertinent Vitals/ Pain       Pain Assessment: No/denies pain         Frequency Min 2X/week     Progress Toward Goals  OT Goals(current goals can now be found in the care plan section)  Progress towards OT goals: Progressing toward goals     Plan Discharge plan remains appropriate       End of Session Equipment Utilized During Treatment: Back brace   Activity Tolerance Patient tolerated treatment well   Patient Left in chair;with call bell/phone within reach;with family/visitor present   Nurse Communication          Time: 6568-1275 OT Time Calculation (min): 25 min  Charges: OT General Charges $OT Visit: 1 Procedure OT Treatments $Self Care/Home Management : 8-22 mins $Therapeutic Activity: 8-22 mins  Villa Herb M 10/26/2014, 11:08 AM   Cyndie Chime, OTR/L  Occupational Therapist [phone] (pager)

## 2014-10-26 NOTE — Progress Notes (Signed)
Physical Therapy Treatment Patient Details Name: Gregory [name] MRN: [id number] DOB: 05/18/1951 Today's Date: 10/26/2014    History of Present Illness Patient is a 63 y/o male s/p L4-5 PLIF. PMH of CAD, PNA, RBBB, HLD, aortic stenosis, asthma, bipolar disorder and HTN.    PT Comments    Patient making great progress with mobility. Patient asking lots of questions regarding mobility and precautions prior to DC home. Patient safe to D/C from a mobility standpoint based on progression towards goals set on PT eval.    Follow Up Recommendations  Home health PT;Supervision/Assistance - 24 hour     Equipment Recommendations  None recommended by PT    Recommendations for Other Services       Precautions / Restrictions Precautions Precautions: Back;Fall Precaution Booklet Issued: Yes (comment) Precaution Comments: Patient able to verbalize all precautions Required Braces or Orthoses: Spinal Brace Spinal Brace: Lumbar corset;Applied in sitting position Restrictions Weight Bearing Restrictions: No    Mobility  Bed Mobility     Rolling: Supervision Sidelying to sit: Supervision       General bed mobility comments: Cues for log roll technique  Transfers Overall transfer level: Modified independent Equipment used: None Transfers: Sit to/from Stand Sit to Stand: Supervision         General transfer comment: Supervision for safety  Ambulation/Gait Ambulation/Gait assistance: Supervision Ambulation Distance (Feet): 600 Feet Assistive device: Rolling walker (2 wheeled) Gait Pattern/deviations: Step-through pattern Gait velocity: decreased       Stairs            Wheelchair Mobility    Modified Rankin (Stroke Patients Only)       Balance                                    Cognition Arousal/Alertness: Awake/alert Behavior During Therapy: WFL for tasks assessed/performed Overall Cognitive Status: Within Functional Limits for tasks  assessed                      Exercises      General Comments        Pertinent Vitals/Pain Pain Assessment: No/denies pain    Home Living                      Prior Function            PT Goals (current goals can now be found in the care plan section) Progress towards PT goals: Progressing toward goals    Frequency  Min 5X/week    PT Plan Current plan remains appropriate    Co-evaluation             End of Session Equipment Utilized During Treatment: Gait belt Activity Tolerance: Patient tolerated treatment well Patient left: in chair;with call bell/phone within reach     Time: 0745-0810 PT Time Calculation (min): 25 min  Charges:  $Gait Training: 8-22 mins $Therapeutic Activity: 8-22 mins                    G Codes:      Jacqualyn Posey 10/26/2014, 11:50 AM 10/26/2014 Jacqualyn Posey PTA [phone] pager [phone] office

## 2014-10-30 ENCOUNTER — Telehealth (HOSPITAL_COMMUNITY): Payer: Self-pay | Admitting: *Deleted

## 2014-11-08 ENCOUNTER — Other Ambulatory Visit: Payer: Self-pay

## 2014-11-08 ENCOUNTER — Telehealth: Payer: Self-pay | Admitting: Family Medicine

## 2014-11-08 DIAGNOSIS — J301 Allergic rhinitis due to pollen: Secondary | ICD-10-CM

## 2014-11-08 DIAGNOSIS — J4520 Mild intermittent asthma, uncomplicated: Secondary | ICD-10-CM

## 2014-11-08 MED ORDER — FLUTICASONE PROPIONATE 50 MCG/ACT NA SUSP: 2.0000 | Freq: Every day | NASAL | Status: DC

## 2014-11-08 MED ORDER — ALBUTEROL SULFATE HFA 108 (90 BASE) MCG/ACT IN AERS: 2.0000 | INHALATION_SPRAY | Freq: Four times a day (QID) | RESPIRATORY_TRACT | Status: DC | PRN

## 2014-11-08 MED ORDER — FLUTICASONE-SALMETEROL 500-50 MCG/DOSE IN AEPB: 1.0000 | INHALATION_SPRAY | Freq: Two times a day (BID) | RESPIRATORY_TRACT | Status: DC

## 2014-11-08 NOTE — Telephone Encounter (Signed)
Sent in meds 

## 2014-11-08 NOTE — Telephone Encounter (Signed)
done

## 2014-11-08 NOTE — Telephone Encounter (Signed)
Pt called and scheduled an cpe for January. Needs medications refilled. He states if must be for at least [place]. He needs advair, flonase and albuterol. Pt uses [facility].

## 2014-11-20 ENCOUNTER — Telehealth: Payer: Self-pay | Admitting: Internal Medicine

## 2014-11-20 DIAGNOSIS — J4520 Mild intermittent asthma, uncomplicated: Secondary | ICD-10-CM

## 2014-11-20 DIAGNOSIS — J301 Allergic rhinitis due to pollen: Secondary | ICD-10-CM

## 2014-11-20 MED ORDER — ALBUTEROL SULFATE HFA 108 (90 BASE) MCG/ACT IN AERS: 2.0000 | INHALATION_SPRAY | Freq: Four times a day (QID) | RESPIRATORY_TRACT | Status: DC | PRN

## 2014-11-20 MED ORDER — FLUTICASONE PROPIONATE 50 MCG/ACT NA SUSP: 2.0000 | Freq: Every day | NASAL | Status: DC

## 2014-11-20 MED ORDER — FLUTICASONE-SALMETEROL 500-50 MCG/DOSE IN AEPB: 1.0000 | INHALATION_SPRAY | Freq: Two times a day (BID) | RESPIRATORY_TRACT | Status: DC

## 2014-11-20 NOTE — Telephone Encounter (Signed)
Pt called and states that med was sent to [facility] and not [facility]

## 2014-12-07 ENCOUNTER — Encounter: Payer: Self-pay | Admitting: Internal Medicine

## 2014-12-11 ENCOUNTER — Telehealth: Payer: Self-pay | Admitting: Family Medicine

## 2014-12-11 MED ORDER — AMLODIPINE BESYLATE 10 MG PO TABS: 10.0000 mg | ORAL_TABLET | Freq: Every day | ORAL | Status: DC

## 2014-12-11 NOTE — Telephone Encounter (Signed)
Refill the 10 mg

## 2014-12-11 NOTE — Telephone Encounter (Signed)
done

## 2014-12-11 NOTE — Telephone Encounter (Signed)
[facility] left message that they usually refill Amlodipine 5 mg and they also have req for 10 mg which one should pt be on?  [phone]  Ref [id number]

## 2014-12-11 NOTE — Telephone Encounter (Signed)
Rcvd rx clarification request asking if the Amlodipine 10MG  #90 is correct strength and if so can they discontinue the Amlodipine 5MG  #90

## 2014-12-12 ENCOUNTER — Telehealth: Payer: Self-pay | Admitting: Family Medicine

## 2014-12-12 ENCOUNTER — Other Ambulatory Visit: Payer: Self-pay | Admitting: *Deleted

## 2014-12-12 DIAGNOSIS — I10 Essential (primary) hypertension: Secondary | ICD-10-CM

## 2014-12-12 MED ORDER — METOPROLOL TARTRATE 25 MG PO TABS: 12.5000 mg | ORAL_TABLET | Freq: Every day | ORAL | Status: DC

## 2014-12-12 NOTE — Telephone Encounter (Signed)
Pt needs refills send in to mail order pharmacy. He needs Lopressor sent to [facility] mail order. Please send at least 90 day supply. Pt can be reached at [phone].

## 2014-12-12 NOTE — Telephone Encounter (Signed)
cardiologist have been filling this for pt. So pt was notified to call them to get it filled

## 2014-12-26 ENCOUNTER — Encounter: Payer: Self-pay | Admitting: Family Medicine

## 2014-12-26 ENCOUNTER — Ambulatory Visit (INDEPENDENT_AMBULATORY_CARE_PROVIDER_SITE_OTHER): Payer: PPO | Admitting: Family Medicine

## 2014-12-26 VITALS — BP 116/70 | HR 47 | Ht 65.0 in | Wt 142.0 lb

## 2014-12-26 DIAGNOSIS — Z720 Tobacco use: Secondary | ICD-10-CM

## 2014-12-26 DIAGNOSIS — I10 Essential (primary) hypertension: Secondary | ICD-10-CM

## 2014-12-26 DIAGNOSIS — J4530 Mild persistent asthma, uncomplicated: Secondary | ICD-10-CM

## 2014-12-26 DIAGNOSIS — E785 Hyperlipidemia, unspecified: Secondary | ICD-10-CM

## 2014-12-26 DIAGNOSIS — I350 Nonrheumatic aortic (valve) stenosis: Secondary | ICD-10-CM

## 2014-12-26 DIAGNOSIS — Z981 Arthrodesis status: Secondary | ICD-10-CM

## 2014-12-26 DIAGNOSIS — J301 Allergic rhinitis due to pollen: Secondary | ICD-10-CM

## 2014-12-26 DIAGNOSIS — F17200 Nicotine dependence, unspecified, uncomplicated: Secondary | ICD-10-CM

## 2014-12-26 DIAGNOSIS — F3170 Bipolar disorder, currently in remission, most recent episode unspecified: Secondary | ICD-10-CM

## 2014-12-26 NOTE — Addendum Note (Signed)
Addended by: [name] on: 12/26/2014 04:14 PM   Modules accepted: Level of Service

## 2014-12-26 NOTE — Progress Notes (Signed)
Subjective:    Patient ID: Gregory [name], male    DOB: Apr 25, 1951, 64 y.o.   MRN: [id number]  HPI He is here for complete examination. He remains alcohol free and is quite proud of this. He continues in counseling with Dr. [name] and will need a blood level. He has been off his medicine for approximately 2 weeks and we will therefore need to hold on drawing any blood for this. His allergies and asthma are under good control. He continues on his blood pressure medications. He recently had a spinal fusion and is doing well on this. He is using very little pain medication for this. He does smoke and is not interested in quitting at this point. He has seen his cardiologist prior to his surgery. His left ear is mild and giving him no trouble. Social and family history were reviewed. His immunizations were also reviewed. Apparently he had a colonoscopy in 2009. He has no other concerns or complaints.   Review of Systems  All other systems reviewed and are negative.      Objective:   Physical Exam BP 116/70 mmHg  Pulse 47  Ht 5\' 5"  (1.651 m)  Wt 142 lb (64.411 kg)  BMI 23.63 kg/m2  SpO2 96%  General Appearance:    Alert, cooperative, no distress, appears stated age  Head:    Normocephalic, without obvious abnormality, atraumatic  Eyes:    PERRL, conjunctiva/corneas clear, EOM's intact fundi    benign  Ears:    Normal TM's and external ear canals  Nose:   Nares normal, mucosa normal, no drainage or sinus   tenderness  Throat:   Lips, mucosa, and tongue normal; teeth and gums normal  Neck:   Supple, no lymphadenopathy;  thyroid:  no   enlargement/tenderness/nodules; no carotid   bruit or JVD  Back:    Spine nontender, no curvature, ROM normal, no CVA     tenderness  Lungs:     Clear to auscultation bilaterally without wheezes, rales or     ronchi; respirations unlabored  Chest Wall:    No tenderness or deformity   Heart:    Regular rate and rhythm, S1 and S2 normal, no murmur, rub   or  gallop  Breast Exam:    No chest wall tenderness, masses or gynecomastia  Abdomen:     Soft, non-tender, nondistended, normoactive bowel sounds,    no masses, no hepatosplenomegaly        Extremities:   No clubbing, cyanosis or edema  Pulses:   2+ and symmetric all extremities  Skin:   Skin color, texture, turgor normal, no rashes or lesions  Lymph nodes:   Cervical, supraclavicular, and axillary nodes normal  Neurologic:   CNII-XII intact, normal strength, sensation and gait; reflexes 2+ and symmetric throughout          Psych:   Normal mood, affect, hygiene and grooming.          Assessment & Plan:  Asthma, mild persistent, uncomplicated  Bipolar affective disorder in remission - Plan: Valproic acid level  Hyperlipidemia with target LDL less than 130 - Plan: Lipid panel  Essential hypertension - Plan: Valproic acid level, CBC with Differential, Comprehensive metabolic panel  S/P lumbar spinal fusion  Allergic rhinitis due to pollen  Current smoker  Aortic stenosis  I encouraged him to continue with his counseling as well as alcohol abstinence. He will continue on all of his other medications. Recommend he use ibuprofen 800 mg 3 times  a day when necessary pain and only rely on the codeine on an as-needed basis., We will do a smoking at a later date. Return here in 3 months for medication update and blood work.

## 2014-12-26 NOTE — Patient Instructions (Signed)
Try the ibuprofen first for pain relief and then if that doesn't work then use the codeine. You can take 800 mg 3 times per day as needed.

## 2015-02-12 ENCOUNTER — Other Ambulatory Visit: Payer: Self-pay

## 2015-02-12 ENCOUNTER — Telehealth: Payer: Self-pay | Admitting: Internal Medicine

## 2015-02-12 MED ORDER — ATORVASTATIN CALCIUM 20 MG PO TABS: 20.0000 mg | ORAL_TABLET | Freq: Every day | ORAL | Status: DC

## 2015-02-12 MED ORDER — AMLODIPINE BESYLATE 10 MG PO TABS: 10.0000 mg | ORAL_TABLET | Freq: Every day | ORAL | Status: DC

## 2015-02-12 NOTE — Telephone Encounter (Signed)
Refill request for amlodipine 10mg , and atorvastatin 20mg  #90 to [facility] [address]

## 2015-02-12 NOTE — Telephone Encounter (Signed)
done

## 2015-02-14 ENCOUNTER — Ambulatory Visit (INDEPENDENT_AMBULATORY_CARE_PROVIDER_SITE_OTHER): Payer: PPO | Admitting: Family Medicine

## 2015-02-14 ENCOUNTER — Encounter: Payer: Self-pay | Admitting: Family Medicine

## 2015-02-14 VITALS — BP 102/60 | HR 45 | Ht 63.0 in | Wt 139.0 lb

## 2015-02-14 DIAGNOSIS — J4530 Mild persistent asthma, uncomplicated: Secondary | ICD-10-CM

## 2015-02-14 DIAGNOSIS — F1020 Alcohol dependence, uncomplicated: Secondary | ICD-10-CM | POA: Diagnosis not present

## 2015-02-14 DIAGNOSIS — Z720 Tobacco use: Secondary | ICD-10-CM | POA: Diagnosis not present

## 2015-02-14 DIAGNOSIS — F17200 Nicotine dependence, unspecified, uncomplicated: Secondary | ICD-10-CM

## 2015-02-14 DIAGNOSIS — J209 Acute bronchitis, unspecified: Secondary | ICD-10-CM | POA: Diagnosis not present

## 2015-02-14 MED ORDER — AMOXICILLIN-POT CLAVULANATE 875-125 MG PO TABS: 1.0000 | ORAL_TABLET | Freq: Two times a day (BID) | ORAL | Status: DC

## 2015-02-14 MED ORDER — BENZONATATE 100 MG PO CAPS
200.0000 mg | ORAL_CAPSULE | Freq: Three times a day (TID) | ORAL | Status: DC | PRN
Start: 2015-02-14 — End: 2015-04-10

## 2015-02-14 NOTE — Progress Notes (Signed)
   Subjective:    Patient ID: Deacon Gadbois, male    DOB: [birth year], 64 y.o.   MRN: [id number]  HPI He complains of a two-week history that started with sinus congestion followed by PND, nasal congestion, earache, cough that has become productive with hoarse voice and intermittent dizziness. He continues to smoke. He has been alcohol free for 8 months now. He is using his albuterol inhaler regularly.   Review of Systems     Objective:   Physical Exam Alert and in no distress. Hoarse voice is noted Tympanic membranes and canals are normal. Pharyngeal area is normal. Neck is supple without adenopathy or thyromegaly. Cardiac exam shows a regular sinus rhythm without murmurs or gallops. Lungs are clear to auscultation.        Assessment & Plan:  Current smoker  Asthma, mild persistent, uncomplicated  Acute bronchitis, unspecified organism - Plan: amoxicillin-clavulanate (AUGMENTIN) 875-125 MG per tablet, benzonatate (TESSALON) 100 MG capsule  Alcoholic  he will call if continued trouble with cough. Congratulated him on his 8 months of sobriety. He will start a smoking cessation program later.

## 2015-02-27 ENCOUNTER — Telehealth: Payer: Self-pay | Admitting: Family Medicine

## 2015-02-27 MED ORDER — AZITHROMYCIN 500 MG PO TABS: 500.0000 mg | ORAL_TABLET | Freq: Every day | ORAL | Status: DC

## 2015-02-27 NOTE — Telephone Encounter (Signed)
Z-Pak called in at patient request

## 2015-02-27 NOTE — Telephone Encounter (Signed)
Pt was getting better, now bad again, dizzy, rib and back pain think due to cough, burning and pain in ears coming back.  Wants different antibiotic (Zpac) called into [facility].

## 2015-03-21 ENCOUNTER — Encounter: Payer: Self-pay | Admitting: Psychiatry

## 2015-04-10 ENCOUNTER — Encounter: Payer: Self-pay | Admitting: Family Medicine

## 2015-04-10 ENCOUNTER — Ambulatory Visit (INDEPENDENT_AMBULATORY_CARE_PROVIDER_SITE_OTHER): Payer: PPO | Admitting: Family Medicine

## 2015-04-10 VITALS — BP 126/70 | HR 50 | Wt 136.0 lb

## 2015-04-10 DIAGNOSIS — M7918 Myalgia, other site: Secondary | ICD-10-CM

## 2015-04-10 DIAGNOSIS — F1020 Alcohol dependence, uncomplicated: Secondary | ICD-10-CM | POA: Diagnosis not present

## 2015-04-10 DIAGNOSIS — M791 Myalgia: Secondary | ICD-10-CM

## 2015-04-10 NOTE — Patient Instructions (Addendum)
4 ibuprofen 3 times per day. Heat for 20 minutes 3 times per day and stretch afterwards. Massage can help. Back off on the back exercises until this goes away completely and then start back about half of where you were before and move slower Proper posturing. Sit like your mama touch

## 2015-04-10 NOTE — Progress Notes (Signed)
   Subjective:    Patient ID: Gregory [name], male    DOB: 06-13-1951, 64 y.o.   MRN: [id number]  HPI He complains of a one-month history of right posterior rib pain. It was difficult to get a good history from him. He states that he did have back surgery and has up with neurosurgery. They apparently gave him steroids in late March for bilateral posterior rib pain which did help but did not make a goalie completely. In early April he noted right lateral posterior rib pain worse with motion. He states that it is point tender. He has had no cough, congestion, sore throat or urinary symptoms. At the end of the encounter he then mentioned that several weeks prior to the start of this pain he did start an exercise program at very light weights. He also states that he is doing quite well with his alcohol abstinence. 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  Review of Systems     Objective:   Physical Exam He points to the right upper thoracic paravertebral muscles as to where he has the point tenderness. No palpable lesions were noted. Good motion of his back. No tenderness over the ribs.       Assessment & Plan:  Musculoskeletal pain  Alcoholic recommend heat, stretching and ibuprofen. Also massage can be very helpful. Recommend avoiding physical activities until the pain goes away and then start back at 50% of his previous level. Return here if trouble.

## 2015-04-23 ENCOUNTER — Encounter: Payer: Self-pay | Admitting: Gastroenterology

## 2015-05-07 ENCOUNTER — Other Ambulatory Visit: Payer: Self-pay | Admitting: Family Medicine

## 2015-05-08 ENCOUNTER — Encounter: Payer: Self-pay | Admitting: Family Medicine

## 2015-05-16 ENCOUNTER — Other Ambulatory Visit: Payer: Self-pay | Admitting: Family Medicine

## 2015-06-15 ENCOUNTER — Ambulatory Visit (INDEPENDENT_AMBULATORY_CARE_PROVIDER_SITE_OTHER): Payer: PPO | Admitting: Family Medicine

## 2015-06-15 ENCOUNTER — Encounter: Payer: Self-pay | Admitting: Family Medicine

## 2015-06-15 VITALS — BP 100/52 | HR 54 | Wt 140.0 lb

## 2015-06-15 DIAGNOSIS — R200 Anesthesia of skin: Secondary | ICD-10-CM | POA: Diagnosis not present

## 2015-06-15 NOTE — Progress Notes (Signed)
   Subjective:    Patient ID: Gregory [name], male    DOB: 1951-12-13, 64 y.o.   MRN: [id number]  HPI He complains of a 5 week history of numbness. He states that initially started in the left great toe and spreading to all the toes and then onto the plantar surface as well as dorsal surface of the foot. He has noted no weakness or tingling. He has not fallen. There is no relation to physical activity.No bowel or bladder problems. He seen no skin changes. He now states that it also is occurring on the right side again with the same lack of symptoms. He recently saw his neurosurgeon and was recommended to start a OTC preparation for this.   Review of Systems     Objective:   Physical Exam Exam of both feet shows normal pulses. Sensory deficit was difficult to assess but he did have skip lesions in terms of the crease sensory and in normal sensation over different parts of the foot. He also complained of decreased sensory halfway up the calf. Ankle reflex was gone. Knee reflex normal. Full motion of the ankles.       Assessment & Plan:  Numbness The pattern of his symptoms is very vague and does not fit into a nice neat neuropathic pattern. I explained this to him. Recommended watchful waiting. If he continues to have difficulty nerve conduction studies might be needed.

## 2015-08-03 ENCOUNTER — Other Ambulatory Visit: Payer: Self-pay | Admitting: *Deleted

## 2015-08-03 ENCOUNTER — Telehealth: Payer: Self-pay | Admitting: Family Medicine

## 2015-08-03 DIAGNOSIS — R200 Anesthesia of skin: Secondary | ICD-10-CM

## 2015-08-03 NOTE — Telephone Encounter (Signed)
Pt called and stated that he is still having issues. He would like to go ahead and start the next step. Per your notes looks like a nerve conduction study.

## 2015-08-03 NOTE — Telephone Encounter (Signed)
set this up

## 2015-08-03 NOTE — Telephone Encounter (Signed)
Referral put in for NCV at [facility].

## 2015-08-10 ENCOUNTER — Other Ambulatory Visit: Payer: Self-pay | Admitting: *Deleted

## 2015-08-10 DIAGNOSIS — R200 Anesthesia of skin: Secondary | ICD-10-CM

## 2015-08-28 ENCOUNTER — Ambulatory Visit (INDEPENDENT_AMBULATORY_CARE_PROVIDER_SITE_OTHER): Payer: PPO | Admitting: Neurology

## 2015-08-28 DIAGNOSIS — R200 Anesthesia of skin: Secondary | ICD-10-CM

## 2015-08-28 DIAGNOSIS — G609 Hereditary and idiopathic neuropathy, unspecified: Secondary | ICD-10-CM

## 2015-08-28 DIAGNOSIS — M5417 Radiculopathy, lumbosacral region: Secondary | ICD-10-CM

## 2015-08-28 NOTE — Procedures (Signed)
[facility] Neurology  [place], [place]  [place], [place] [address] Tel: [phone] Fax:  [phone] Test Date:  08/28/2015  Patient: Gregory [name] DOB: 1951-01-24 Physician: Narda Amber, DO  Sex: Male Height: 5\' 5"  Ref Phys: [name], M.D.  ID#: [id number]   Technician: Jerilynn Mages. Dean   Patient Complaints: This is a 64 year old gentleman presenting for bilateral feet numbness. He has underwent decompressive lumbar laminectomy at L4-L5 in November 2015.  NCV & EMG Findings: Extensive electrodiagnostic testing of the right lower extremity and additional studies of the left shows:  1. Bilateral superficial peroneal sensory responses are absent. Bilateral sural sensory responses are intact. 2. Right peroneal motor response is reduced at the extensor digitorum brevis, however it is within normal limits when recording at the tibialis anterior. Bilateral tibial and the left peroneal motor responses are within normal limits. 3. Bilateral H reflex studies are mildly prolonged. 4. Chronic motor axon loss changes are seen affecting the L5 myotomes bilaterally, without accompanied active denervation.  Impression: 1. The electrophysiologic and is most consistent with a generalized sensorimotor polyneuropathy, axon loss in type, affecting the lower extremities. Overall, these findings are moderate in degree electrically. 2. There is also evidence of a the residuals of a chronic L5 radiculopathy affecting bilateral lower extremities; mild in degree electrically.   ___________________________ Narda Amber, DO    Nerve Conduction Studies Anti Sensory Summary Table   Site NR Peak (ms) Norm Peak (ms) P-T Amp (V) Norm P-T Amp  Left Sup Peroneal Anti Sensory (Ant Lat Mall)  12 cm NR  <4.6  >3  Right Sup Peroneal Anti Sensory (Ant Lat Mall)  12 cm NR  <4.6  >3  Left Sural Anti Sensory (Lat Mall)  Calf    4.2 <4.6 8.7 >3  Right Sural Anti Sensory (Lat Mall)  Calf    4.2 <4.6 6.6 >3    Motor Summary Table   Site NR Onset (ms) Norm Onset (ms) O-P Amp (mV) Norm O-P Amp Site1 Site2 Delta-0 (ms) Dist (cm) Vel (m/s) Norm Vel (m/s)  Left Peroneal Motor (Ext Dig Brev)  Ankle    4.7 <6.0 3.5 >2.5 B Fib Ankle 6.7 31.0 46 >40  B Fib    11.4  2.8  Poplt B Fib 2.0 10.0 50 >40  Poplt    13.4  3.0         Right Peroneal Motor (Ext Dig Brev)  Ankle    4.1 <6.0 2.2 >2.5 B Fib Ankle 7.2 30.0 42 >40  B Fib    11.3  1.9  Poplt B Fib 2.5 10.0 40 >40  Poplt    13.8  1.9         Left Peroneal TA Motor (Tib Ant)  Fib Head    3.5 <4.5 5.6 >3 Poplit Fib Head 2.2 9.0 41 >40  Poplit    5.7  5.3         Right Peroneal TA Motor (Tib Ant)  Fib Head    2.9 <4.5 4.7 >3 Poplit Fib Head 1.6 10.0 63 >40  Poplit    4.5  4.7         Left Tibial Motor (Abd Hall Brev)  Ankle    4.8 <6.0 6.9 >4 Knee Ankle 8.3 39.0 47 >40  Knee    13.1  5.8         Right Tibial Motor (Abd Hall Brev)  Ankle    3.3 <6.0 12.4 >4 Knee Ankle 9.9 42.0  42 >40  Knee    13.2  8.7          F Wave Studies   NR F-Lat (ms) Lat Norm (ms) L-R F-Lat (ms)  Right Tibial (Mrkrs) (Abd Hallucis)     51.91 <55    H Reflex Studies   NR H-Lat (ms) Lat Norm (ms) L-R H-Lat (ms)  Left Tibial (Gastroc)     35.24 <35 2.17  Right Tibial (Gastroc)     37.41 <35 2.17   EMG   Side Muscle Ins Act Fibs Psw Fasc Number Recrt Dur Dur. Amp Amp. Poly Poly. Comment  Right BicepsFemS Nml Nml Nml Nml Nml Nml Nml Nml Nml Nml Nml Nml N/A  Right RectFemoris Nml Nml Nml Nml Nml Nml Nml Nml Nml Nml Nml Nml N/A  Right Gastroc Nml Nml Nml Nml Nml Nml Nml Nml Nml Nml Nml Nml N/A  Right Flex Dig Long Nml Nml Nml Nml 1- Mod-R Some 1+ Nml Nml Nml Nml N/A  Right GluteusMed Nml Nml Nml Nml 1- Mod-R Some 1+ Nml Nml Nml Nml N/A  Right AntTibialis Nml Nml Nml Nml 1- Mod-R Some 1+ Nml Nml Nml Nml N/A  Left Flex Dig Long Nml Nml Nml Nml 2- Rapid Some 1+ Some 1+ Nml Nml N/A  Left BicepsFemS Nml Nml Nml Nml Nml Nml Nml Nml Nml Nml Nml Nml N/A  Left RectFemoris  Nml Nml Nml Nml Nml Nml Nml Nml Nml Nml Nml Nml N/A  Left Gastroc Nml Nml Nml Nml Nml Nml Nml Nml Nml Nml Nml Nml N/A  Left AntTibialis Nml Nml Nml Nml 1- Mod-R Some 1+ Nml Nml Nml Nml N/A  Left GluteusMed Nml Nml Nml Nml 1- Mod-R Some 1+ Some 1+ Nml Nml N/A      Waveforms:

## 2015-09-07 ENCOUNTER — Telehealth: Payer: Self-pay

## 2015-09-07 NOTE — Telephone Encounter (Signed)
Patient called and asked for his results from nerve conduction study said test was in epic but he couldn't makes heads or tails out of it did he need further test?

## 2015-09-12 ENCOUNTER — Telehealth: Payer: Self-pay | Admitting: Family Medicine

## 2015-09-12 DIAGNOSIS — G629 Polyneuropathy, unspecified: Secondary | ICD-10-CM

## 2015-09-12 NOTE — Telephone Encounter (Signed)
Pt called back wanting to know the results of his nerve study, pt can be reached at [phone]

## 2015-09-12 NOTE — Telephone Encounter (Signed)
I discussed the neuropathy symptoms with him. At this point I do not think medication would be appropriate and he is comfortable with this.

## 2015-10-08 ENCOUNTER — Ambulatory Visit (INDEPENDENT_AMBULATORY_CARE_PROVIDER_SITE_OTHER): Payer: PPO | Admitting: Family Medicine

## 2015-10-08 ENCOUNTER — Encounter: Payer: Self-pay | Admitting: Family Medicine

## 2015-10-08 VITALS — BP 140/88 | HR 60 | Temp 97.9°F | Wt 140.0 lb

## 2015-10-08 DIAGNOSIS — J0140 Acute pansinusitis, unspecified: Secondary | ICD-10-CM

## 2015-10-08 DIAGNOSIS — Z720 Tobacco use: Secondary | ICD-10-CM | POA: Diagnosis not present

## 2015-10-08 DIAGNOSIS — F17200 Nicotine dependence, unspecified, uncomplicated: Secondary | ICD-10-CM

## 2015-10-08 MED ORDER — AZITHROMYCIN 250 MG PO TABS: ORAL_TABLET | ORAL | Status: DC

## 2015-10-08 NOTE — Progress Notes (Signed)
   Subjective:    Patient ID: Gregory [name], male    DOB: 22-Mar-1951, 64 y.o.   MRN: [id number]  HPI Pt here with 3 week history of sinus pressure, drainage, itchy and burning ears, pain to upper teeth, and dry cough. States he has sinus infections, usually 2 per year. States Z-Pak worked for him last March. Has asthma and has been using albuterol inhaler daily in past couple weeks due to illness. Reports he is treating symptoms with Robitussin, antihistamines, neti pot.  Smokes daily and is not ready to quit. States he stopped drinking alcohol 15 months ago and is not ready to give up cigarettes yet. Denies fever, chills, nausea, vomiting, chest pain, shortness of breath.  Reviewed allergies, medications, past medical history and social history.    Review of Systems Pertinent positives and negatives in the history of present illness.    Objective:   Physical Exam  Alert and in no distress. Maxillary and ethmoid sinus tenderness. Tympanic membranes and canals are normal. Pharyngeal area is erythematous without edema or exudate. Neck is supple without adenopathy. Cardiac exam shows a regular rate and rhythm. Lungs are clear to auscultation.       Assessment & Plan:  Acute pansinusitis, recurrence not specified - Plan: azithromycin (ZITHROMAX Z-PAK) 250 MG tablet  Current smoker  Discussed that he should continue treating his symptoms. He will let me know if he is not back to baseline after completing the antibiotic. Discussed that he should not be needing his albuterol inhaler daily and if he continues to need this after the antibiotic that he should let us know. Also discussed smoking cessation.

## 2015-10-12 ENCOUNTER — Other Ambulatory Visit: Payer: Self-pay | Admitting: Family Medicine

## 2015-10-12 ENCOUNTER — Telehealth: Payer: Self-pay

## 2015-10-12 MED ORDER — LEVOFLOXACIN 500 MG PO TABS: 500.0000 mg | ORAL_TABLET | Freq: Every day | ORAL | Status: DC

## 2015-10-12 NOTE — Telephone Encounter (Signed)
Please let him know that I will send a prescription for Levaquin to his pharmacy. He can stop the Z-pak and start the Levaquin. Thanks

## 2015-10-12 NOTE — Telephone Encounter (Addendum)
Pt called saying the azithromicin didn't do much and he is still sick. He wants to know if you can give him something else or what it is you want to do about this. His pharmacy is the [facility] on [address]

## 2015-10-12 NOTE — Telephone Encounter (Signed)
Pt states that he has sinus drainage, sinus pressure, ear pain, no fever, sore throat. His cough is somewhat productive and taken Robitussin and chest congestion and using albuterol 4-5 days. Augmentin didn't help as much last time

## 2015-10-12 NOTE — Telephone Encounter (Signed)
Pt took like dose of zpak today. Pt will pick up med at pharmacy

## 2015-10-12 NOTE — Progress Notes (Signed)
Pt took his last dose today

## 2015-10-12 NOTE — Telephone Encounter (Signed)
Please call and ask him what his symptoms are and if they are any different than when he was here for his visit, is it still more sinus related or more in his chest now?  Any fever? Productive cough? Still needing albuterol inhaler and how often?

## 2015-11-02 ENCOUNTER — Other Ambulatory Visit: Payer: Self-pay | Admitting: Family Medicine

## 2015-11-02 ENCOUNTER — Telehealth: Payer: Self-pay | Admitting: Family Medicine

## 2015-11-02 DIAGNOSIS — R058 Other specified cough: Secondary | ICD-10-CM

## 2015-11-02 DIAGNOSIS — R05 Cough: Secondary | ICD-10-CM

## 2015-11-02 DIAGNOSIS — J329 Chronic sinusitis, unspecified: Secondary | ICD-10-CM

## 2015-11-02 MED ORDER — PREDNISONE 10 MG (21) PO TBPK: ORAL_TABLET | ORAL | Status: DC

## 2015-11-02 NOTE — Telephone Encounter (Signed)
Called and spoke with patient and he is afebrile but continues to have a productive cough after 2 rounds of Abx. Discussed patient with Dr. Redmond School and plan is to send in course of steroids and send him for CT of his sinuses and chest x-ray. I would also like for him to come in Monday morning and get a CBC.

## 2015-11-02 NOTE — Telephone Encounter (Signed)
Pt says he is still having issues with his sinuses. He has Sore throat, achy ear & across sinus area. He has been using Neti Pot, salt water gargles, Ibuprofen, Guaifenesin and none of this has helped. Pt wants to know if he can get a refill on antibiotic or a different antibiotic.

## 2015-11-02 NOTE — Telephone Encounter (Signed)
Pt states that a few days into the Levaquin he started feeling better but after he finished it, that it came right back. All symptoms came back. Said he does have a productive cough. Not sure if his body aches are coming from how much he is coughing or not. Taking OTC robitussin. Said he does not think that it is in his lungs its more of throat, ears, and sinuses. States no fever. Using his Advair inhaler 2 times a day and is using his rescue inhaler 4 times a day. Michela Pitcher he is extremely tired but feels like this is just a bad sinus infection that wont go away

## 2015-11-02 NOTE — Telephone Encounter (Signed)
Please call and ask him if his symptoms got better at all with the Levaquin antibiotic. Is the problem mainly in his throat and ear and sinus area or is it in his lungs also? Any fever? Cough? How often is he having to use his inhaler?

## 2015-11-02 NOTE — Telephone Encounter (Signed)
Informed pt that we are pending approval on CT because his insurance requires me to do all pre cert via fax. He is coming in for lab work Monday I added that to nurse schedule. I have everything printed that needs to be faxed to insurance and will leave it on sabrinas desk with note if I dont get to fax it today, and itll be on her desk if I do fax it today so that she can be on the look out for the approval fax. I did tell him that he can get the xray at anytime by walking in but he seemed to want to wait on the CT before he did that.

## 2015-11-05 ENCOUNTER — Ambulatory Visit
Admission: RE | Admit: 2015-11-05 | Discharge: 2015-11-05 | Disposition: A | Discharge status: Home / Self Care | Payer: PPO | Source: Ambulatory Visit | Attending: Family Medicine | Admitting: Family Medicine

## 2015-11-05 ENCOUNTER — Encounter: Payer: Self-pay | Admitting: Internal Medicine

## 2015-11-05 ENCOUNTER — Other Ambulatory Visit: Payer: PPO

## 2015-11-05 DIAGNOSIS — R058 Other specified cough: Secondary | ICD-10-CM

## 2015-11-05 DIAGNOSIS — R05 Cough: Secondary | ICD-10-CM

## 2015-11-05 LAB — CBC WITH DIFFERENTIAL/PLATELET
Basophils Absolute: 0 10*3/uL (ref 0.0–0.1)
Basophils Relative: 0 % (ref 0–1)
Eosinophils Absolute: 0 10*3/uL (ref 0.0–0.7)
Eosinophils Relative: 0 % (ref 0–5)
HCT: 40.2 % (ref 39.0–52.0)
Hemoglobin: 14.3 g/dL (ref 13.0–17.0)
Lymphocytes Relative: 20 % (ref 12–46)
Lymphs Abs: 1.6 10*3/uL (ref 0.7–4.0)
MCH: 31.7 pg (ref 26.0–34.0)
MCHC: 35.6 g/dL (ref 30.0–36.0)
MCV: 89.1 fL (ref 78.0–100.0)
MPV: 9.4 fL (ref 8.6–12.4)
Monocytes Absolute: 0.6 10*3/uL (ref 0.1–1.0)
Monocytes Relative: 8 % (ref 3–12)
Neutro Abs: 5.8 10*3/uL (ref 1.7–7.7)
Neutrophils Relative %: 72 % (ref 43–77)
Platelets: 158 10*3/uL (ref 150–400)
RBC: 4.51 MIL/uL (ref 4.22–5.81)
RDW: 14.8 % (ref 11.5–15.5)
WBC: 8 10*3/uL (ref 4.0–10.5)

## 2015-11-05 NOTE — Telephone Encounter (Signed)
Called and left patient a vm to call me back and also sent him a mychart message about appointment as well

## 2015-11-05 NOTE — Telephone Encounter (Signed)
CT was approved Auth # [id number] until 05/02/16. I have set pt up for November 22nd @ 10:15am at the [facility] [place], [place]

## 2015-11-06 NOTE — Telephone Encounter (Signed)
Looks like he read his my-chart message and is aware of his appt for his CT scan   Last Read in MyChart   11/05/2015 4:31 PM by [name]

## 2015-11-13 ENCOUNTER — Other Ambulatory Visit: Payer: Self-pay

## 2015-11-22 ENCOUNTER — Other Ambulatory Visit: Payer: Self-pay | Admitting: Family Medicine

## 2016-01-04 ENCOUNTER — Encounter: Payer: PPO | Admitting: Family Medicine

## 2016-01-23 ENCOUNTER — Telehealth: Payer: Self-pay | Admitting: Family Medicine

## 2016-01-23 NOTE — Telephone Encounter (Signed)
Pt called and said he knows it is a long shot but he has a sinus infection, has post nasal drainage, cough, sinus pressure,. He has a appt for a cpe the 9th but was hoping for something to be called in, i told him you probably would not send him anything in since it has been a while since he has not been a while since he has been in it.pt can be reached at [phone] and uses [address] [place], [address]

## 2016-01-23 NOTE — Telephone Encounter (Signed)
Please call and find out if he went for the CT of his sinuses. I don't see the results. He needs to be seen. It would be best for him to see Dr. Redmond School for this recurrent sinus issue since this appears to be an ongoing problem for him.

## 2016-01-24 NOTE — Telephone Encounter (Signed)
Pt states he did not go for CT because the copay is $200 and he can't afford that. Pt will just wait until the 9th for his cpe to talk about his sinus infection and will use the neti pot to see if it will help

## 2016-01-31 ENCOUNTER — Encounter: Payer: Self-pay | Admitting: Family Medicine

## 2016-01-31 ENCOUNTER — Ambulatory Visit (INDEPENDENT_AMBULATORY_CARE_PROVIDER_SITE_OTHER): Payer: PPO | Admitting: Family Medicine

## 2016-01-31 VITALS — BP 130/80 | HR 74 | Ht 63.0 in | Wt 141.0 lb

## 2016-01-31 DIAGNOSIS — I10 Essential (primary) hypertension: Secondary | ICD-10-CM

## 2016-01-31 DIAGNOSIS — F1994 Other psychoactive substance use, unspecified with psychoactive substance-induced mood disorder: Secondary | ICD-10-CM

## 2016-01-31 DIAGNOSIS — G4733 Obstructive sleep apnea (adult) (pediatric): Secondary | ICD-10-CM | POA: Diagnosis not present

## 2016-01-31 DIAGNOSIS — J301 Allergic rhinitis due to pollen: Secondary | ICD-10-CM | POA: Diagnosis not present

## 2016-01-31 DIAGNOSIS — J4540 Moderate persistent asthma, uncomplicated: Secondary | ICD-10-CM | POA: Diagnosis not present

## 2016-01-31 DIAGNOSIS — F3170 Bipolar disorder, currently in remission, most recent episode unspecified: Secondary | ICD-10-CM | POA: Diagnosis not present

## 2016-01-31 DIAGNOSIS — J0110 Acute frontal sinusitis, unspecified: Secondary | ICD-10-CM

## 2016-01-31 DIAGNOSIS — Z1159 Encounter for screening for other viral diseases: Secondary | ICD-10-CM | POA: Diagnosis not present

## 2016-01-31 DIAGNOSIS — F1011 Alcohol abuse, in remission: Secondary | ICD-10-CM

## 2016-01-31 DIAGNOSIS — I350 Nonrheumatic aortic (valve) stenosis: Secondary | ICD-10-CM | POA: Diagnosis not present

## 2016-01-31 DIAGNOSIS — Z23 Encounter for immunization: Secondary | ICD-10-CM | POA: Diagnosis not present

## 2016-01-31 DIAGNOSIS — F17200 Nicotine dependence, unspecified, uncomplicated: Secondary | ICD-10-CM

## 2016-01-31 DIAGNOSIS — F1010 Alcohol abuse, uncomplicated: Secondary | ICD-10-CM | POA: Diagnosis not present

## 2016-01-31 DIAGNOSIS — E785 Hyperlipidemia, unspecified: Secondary | ICD-10-CM

## 2016-01-31 DIAGNOSIS — J4520 Mild intermittent asthma, uncomplicated: Secondary | ICD-10-CM

## 2016-01-31 DIAGNOSIS — I2510 Atherosclerotic heart disease of native coronary artery without angina pectoris: Secondary | ICD-10-CM

## 2016-01-31 DIAGNOSIS — Z720 Tobacco use: Secondary | ICD-10-CM

## 2016-01-31 LAB — COMPREHENSIVE METABOLIC PANEL
ALT: 33 U/L (ref 9–46)
AST: 28 U/L (ref 10–35)
Albumin: 4.6 g/dL (ref 3.6–5.1)
Alkaline Phosphatase: 67 U/L (ref 40–115)
BUN: 13 mg/dL (ref 7–25)
CO2: 30 mmol/L (ref 20–31)
Calcium: 9.9 mg/dL (ref 8.6–10.3)
Chloride: 106 mmol/L (ref 98–110)
Creat: 0.83 mg/dL (ref 0.70–1.25)
Glucose, Bld: 103 mg/dL — ABNORMAL HIGH (ref 65–99)
Potassium: 5 mmol/L (ref 3.5–5.3)
Sodium: 143 mmol/L (ref 135–146)
Total Bilirubin: 0.4 mg/dL (ref 0.2–1.2)
Total Protein: 7.8 g/dL (ref 6.1–8.1)

## 2016-01-31 LAB — CBC WITH DIFFERENTIAL/PLATELET
Basophils Absolute: 0.1 10*3/uL (ref 0.0–0.1)
Basophils Relative: 1 % (ref 0–1)
Eosinophils Absolute: 0.3 10*3/uL (ref 0.0–0.7)
Eosinophils Relative: 5 % (ref 0–5)
HCT: 44.6 % (ref 39.0–52.0)
Hemoglobin: 15 g/dL (ref 13.0–17.0)
Lymphocytes Relative: 41 % (ref 12–46)
Lymphs Abs: 2.7 10*3/uL (ref 0.7–4.0)
MCH: 30.9 pg (ref 26.0–34.0)
MCHC: 33.6 g/dL (ref 30.0–36.0)
MCV: 92 fL (ref 78.0–100.0)
MPV: 9.5 fL (ref 8.6–12.4)
Monocytes Absolute: 0.7 10*3/uL (ref 0.1–1.0)
Monocytes Relative: 11 % (ref 3–12)
Neutro Abs: 2.8 10*3/uL (ref 1.7–7.7)
Neutrophils Relative %: 42 % — ABNORMAL LOW (ref 43–77)
Platelets: 190 10*3/uL (ref 150–400)
RBC: 4.85 MIL/uL (ref 4.22–5.81)
RDW: 14.3 % (ref 11.5–15.5)
WBC: 6.7 10*3/uL (ref 4.0–10.5)

## 2016-01-31 LAB — LIPID PANEL
Cholesterol: 121 mg/dL — ABNORMAL LOW (ref 125–200)
HDL: 34 mg/dL — ABNORMAL LOW (ref >=40)
LDL Cholesterol: 57 mg/dL (ref <130)
Total CHOL/HDL Ratio: 3.6 Ratio (ref <=5.0)
Triglycerides: 151 mg/dL — ABNORMAL HIGH (ref <150)
VLDL: 30 mg/dL (ref <30)

## 2016-01-31 MED ORDER — AMOXICILLIN-POT CLAVULANATE 875-125 MG PO TABS: 1.0000 | ORAL_TABLET | Freq: Two times a day (BID) | ORAL | Status: DC

## 2016-01-31 NOTE — Addendum Note (Signed)
Addended by: [name] on: 01/31/2016 12:21 PM   Modules accepted: Orders

## 2016-01-31 NOTE — Progress Notes (Signed)
Patient ID: Gregory [name], male   DOB: 1951/10/13, 65 y.o.   MRN: [id number] Gregory [name] is a 65 y.o. male who presents for annual wellness visit and follow-up on chronic medical conditions.  He has the following concerns:He does have underlying bipolar disorder as well as other psychiatric diagnoses over the years. He is being followed by Dr. [name] and is quite stable. He also has a history of aortic stenosis and was seen by Dr.Jordan. He was supposed to be scheduled for an echocardiogram but apparently this was not accomplished. He also has a history of asthma and presently is using Advair 500/50 twice per day and also Proventil 3 times per day to control his breathing. He does smoke but is not interested in quitting. He does have underlying allergies and is using Flonase. He continues on his blood pressure medications. He also continues on atorvastatin. 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 He does complain of a one-month history of earache, sinus congestion, coating on his tongue, sore throat with myalgias, intermittent pourulent PND. He has been using a netti pot.   Immunization History  Administered Date(s) Administered  . Influenza Split 09/28/2012, 10/19/2014, 09/17/2015  . Influenza Whole 10/08/2010  . Influenza-Unspecified 09/21/2013  . Pneumococcal Conjugate-13 01/31/2016  . Pneumococcal Polysaccharide-23 08/30/2013  . Tdap 10/29/2012   Last colonoscopy: Not Due Last PSA: last well exam Ophtho: next month 3/17 Exercise: rarely  Other doctors caring for patient include: Dr Heath Gold) Posey Pronto, [name]   Depression screen:  See questionnaire below.     Depression screen [facility] 2/9 01/31/2016 01/31/2016  Decreased Interest 0 0  Down, Depressed, Hopeless 0 0  PHQ - 2 Score 0 0    Fall Screen: See Questionaire below.   Fall Risk  01/31/2016 01/31/2016  Falls in the past year? No No    ADL  screen:  See questionnaire below.  Functional Status Survey: Is the patient deaf or have difficulty hearing?: No Does the patient have difficulty seeing, even when wearing glasses/contacts?: No Does the patient have difficulty concentrating, remembering, or making decisions?: No Does the patient have difficulty walking or climbing stairs?: No Does the patient have difficulty dressing or bathing?: No Does the patient have difficulty doing errands alone such as visiting a doctor's office or shopping?: No   End of Life Discussion:  Patient has a living will and medical power of attorney   Review of Systems  Constitutional: -fever, -chills, -sweats, -unexpected weight change, -anorexia, -fatigue Allergy: -sneezing, -itching, -congestion Dermatology: denies changing moles, rash, lumps, new worrisome lesions ENT: -runny nose, -ear pain, -sore throat, -hoarseness, -sinus pain, -teeth pain, -tinnitus, -hearing loss, -epistaxis Cardiology:  -chest pain, -palpitations, -edema, -orthopnea, -paroxysmal nocturnal dyspnea Respiratory: -cough, -shortness of breath, -dyspnea on exertion, -wheezing, -hemoptysis Gastroenterology: -abdominal pain, -nausea, -vomiting, -diarrhea, -constipation, -blood in stool, -changes in bowel movement, -dysphagia  Musculoskeletal: -arthralgias, -myalgias, -joint swelling, -back pain, -neck pain, -cramping, -gait changes Ophthalmology: -vision changes, -eye redness, -itching, -discharge Neurology: -headache, -weakness, -tingling, -numbness, -speech abnormality, -memory loss, -falls, -dizziness Psychology:  -depressed mood, -agitation, -sleep problems   PHYSICAL EXAM:  BP 130/80 mmHg  Pulse 74  Ht 5\' 3"  (1.6 m)  Wt 141 lb (63.957 kg)  BMI 24.98 kg/m2  SpO2 98%  General Appearance: Alert, cooperative, no distress, appears stated age Head: Normocephalic, without obvious abnormality, atraumatic Eyes: PERRL, conjunctiva/corneas clear, EOM's intact, fundi  benign Ears:  Normal TM's and external ear canals Nose: Nares normal, mucosa normal, no drainage or sinus   tenderness Throat: Lips, mucosa, and tongue normal; teeth and gums normal Neck: Supple, no lymphadenopathy, thyroid:no enlargement/tenderness/nodules; no carotid bruit or JVD Back: Spine nontender, no curvature, ROM normal, no CVA tenderness Lungs: Clear to auscultation bilaterally without wheezes, rales or ronchi; respirations unlabored Chest Wall: No tenderness or deformity Heart: Regular rate and rhythm, S1 and S2 normal, no murmur, rub or gallop Abdomen: Soft, non-tender, nondistended, normoactive bowel sounds, no masses, no hepatosplenomegaly Extremities: No clubbing, cyanosis or edema Pulses: 2+ and symmetric all extremities Skin: Skin color, texture, turgor normal, no rashes or lesions Lymph nodes: Cervical, supraclavicular, and axillary nodes normal Neurologic: CNII-XII intact, normal strength, sensation and gait; reflexes 2+ and symmetric throughout   Psych: Normal mood, affect, hygiene and grooming  ASSESSMENT/PLAN: Bipolar affective disorder in remission ([place])  Current smoker - Plan: Ambulatory referral to Pulmonology  Substance induced mood disorder ([place])  Need for prophylactic vaccination against Streptococcus pneumoniae (pneumococcus) - Plan: Pneumococcal conjugate vaccine 13-valent  Aortic stenosis - Plan: Echocardiogram  Essential hypertension - Plan: CBC with Differential/Platelet, Comprehensive metabolic panel  Allergic rhinitis due to pollen, unspecified rhinitis seasonality  Asthma, moderate persistent, uncomplicated - Plan: Ambulatory referral to Pulmonology  Coronary artery disease involving native coronary artery of native heart without angina pectoris - Plan: Echocardiogram  Acute frontal sinusitis, recurrence not specified  Need for hepatitis C screening test - Plan: Hepatitis C antibody  Hyperlipidemia with target LDL less than 130 - Plan: Lipid  panel  OSA (obstructive sleep apnea)  Alcohol abuse, in remission     Discussed PSA screening (risks/benefits), recommended at least 30 minutes of aerobic activity at least 5 days/week; proper sunscreen use reviewed; healthy diet and alcohol recommendations (less than or equal to 2 drinks/day) reviewed; regular seatbelt use; changing batteries in smoke detectors. Immunization recommendations discussed.  Colonoscopy recommendations reviewed.   Medicare Attestation I have personally reviewed: The patient's medical and social history Their use of alcohol, tobacco or illicit drugs Their current medications and supplements The patient's functional ability including ADLs,fall risks, home safety risks, cognitive, and hearing and visual impairment Diet and physical activities Evidence for depression or mood disorders  The patient's weight, height, and BMI have been recorded in the chart.  I have made referrals, counseling, and provided education to the patient based on review of the above and I have provided the patient with a written personalized care plan for preventive services.   His pulmonary status is not under good control and I will therefore refer him to an area. He will also need follow-up echocardiogram as his was not done as previously discussed. He will call if not entirely better when he finishes the antibiotic. He is to continue on all of his other medications.  Wyatt Haste, MD   01/31/2016

## 2016-01-31 NOTE — Patient Instructions (Signed)
  Mr. Gregory [name] , Thank you for taking time to come for your Medicare Wellness Visit. I appreciate your ongoing commitment to your health goals. Please review the following plan we discussed and let me know if I can assist you in the future.   These are the goals we discussed: It's time to quit smoking  This is a list of the screening recommended for you and due dates:  Health Maintenance  Topic Date Due  .  Hepatitis C: One time screening is recommended by Center for Disease Control  (CDC) for  adults born from [birth year] through 1966.   31-Oct-1951  . HIV Screening  05/19/1966  . Shingles Vaccine  05/19/2010  . Pneumonia vaccines (1 of 2 - PCV13) 05/20/2015  . Flu Shot  07/22/2016  . Colon Cancer Screening  11/01/2018  . Tetanus Vaccine  10/29/2022

## 2016-02-01 LAB — HEPATITIS C ANTIBODY: HCV Ab: NEGATIVE

## 2016-02-01 MED ORDER — AMLODIPINE BESYLATE 10 MG PO TABS: 10.0000 mg | ORAL_TABLET | Freq: Every day | ORAL | Status: DC

## 2016-02-01 MED ORDER — ATORVASTATIN CALCIUM 20 MG PO TABS: ORAL_TABLET | ORAL | Status: DC

## 2016-02-01 MED ORDER — FLUTICASONE-SALMETEROL 500-50 MCG/DOSE IN AEPB: 1.0000 | INHALATION_SPRAY | Freq: Two times a day (BID) | RESPIRATORY_TRACT | Status: DC

## 2016-02-01 MED ORDER — LISINOPRIL-HYDROCHLOROTHIAZIDE 20-12.5 MG PO TABS: 1.0000 | ORAL_TABLET | Freq: Every morning | ORAL | Status: DC

## 2016-02-01 MED ORDER — METOPROLOL TARTRATE 25 MG PO TABS: 12.5000 mg | ORAL_TABLET | Freq: Every day | ORAL | Status: DC

## 2016-02-01 MED ORDER — ALBUTEROL SULFATE HFA 108 (90 BASE) MCG/ACT IN AERS: 2.0000 | INHALATION_SPRAY | Freq: Four times a day (QID) | RESPIRATORY_TRACT | Status: DC | PRN

## 2016-02-01 NOTE — Addendum Note (Signed)
Addended by: [name] on: 02/01/2016 08:37 AM   Modules accepted: Orders

## 2016-02-08 ENCOUNTER — Other Ambulatory Visit: Payer: Self-pay | Admitting: Family Medicine

## 2016-02-19 ENCOUNTER — Telehealth: Payer: Self-pay | Admitting: Family Medicine

## 2016-02-19 NOTE — Telephone Encounter (Signed)
Pt states at his last visit Dr Redmond School mentioned that he would like for him to be referred to a pulmonary doctor and a Cardiologist. Pt said he has spoken to pulmonary office to set up appt and he thinks a cardiology office may have called him but he has not been able to talk to anyone to set up an appt. Pt wants cardiology contact info where he was referred so that he can call that office to set up appt.

## 2016-02-19 NOTE — Telephone Encounter (Signed)
He has seen Dr. [name] in the past which I think is reasonable but not pulmonary I think he got his signals crossed

## 2016-02-19 NOTE — Telephone Encounter (Signed)
Dr Redmond School I don't know anything about this & I asked Gabriel Cirri & researched the notes. There isn't anything listed about the referrals. Sabrina doesn't recall anything either.

## 2016-02-27 ENCOUNTER — Encounter: Payer: Self-pay | Admitting: Pulmonary Disease

## 2016-02-27 ENCOUNTER — Ambulatory Visit (INDEPENDENT_AMBULATORY_CARE_PROVIDER_SITE_OTHER): Payer: PPO | Admitting: Pulmonary Disease

## 2016-02-27 ENCOUNTER — Other Ambulatory Visit: Payer: PPO

## 2016-02-27 VITALS — BP 130/68 | HR 49 | Ht 63.0 in | Wt 142.0 lb

## 2016-02-27 DIAGNOSIS — G4733 Obstructive sleep apnea (adult) (pediatric): Secondary | ICD-10-CM | POA: Diagnosis not present

## 2016-02-27 DIAGNOSIS — J4540 Moderate persistent asthma, uncomplicated: Secondary | ICD-10-CM

## 2016-02-27 NOTE — Progress Notes (Signed)
Subjective:    Patient ID: Gregory [name], male    DOB: 06-12-1951, 65 y.o.   MRN: [id number]  HPI Consult for evaluation, management of asthma.  Mr. Gregory [name] is a 58 year oldHistory of childhood asthma. He was diagnosed at the age of 36. He is never seen a pulmonologist or had lung function tests. He is currently maintained on Advair 500/50. He is also on albuterol rescue inhaler. She uses it only several times a year during spring and fall allergy season. However he is using it now 3-4 times every day since she gets short of breath with public speaking. he has seasonal allergies, rhinitis, postnasal drip. Denies GERD symptoms.  He was diagnosed with sleep apnea 8 years ago. He used CPAP for a few years but then stopped because of discomfort with the mask. He has symptoms of daily snoring, daytime somnolence, fatigue.  Social History: 1 ppd smoker. Used to drink heavily but is now abstinent.  Family History: Father-skin cancer, stroke, drug abuse Brother- diabetes, hypertension, drug abuse  Past Medical History  Diagnosis Date  . Allergy   . Hypertension   . Bipolar disorder ([place])   . Alcohol abuse   . Tobacco use disorder   . Asthma   . Aortic stenosis   . Neuromuscular disorder ([place])   . Family history of skin cancer   . Hyperlipidemia   . RBBB     a. 7.2012 low risk nuclear exam, sm area of apical ischemia.  . Carotid artery occlusion   . Claudication ([place])   . Smoker   . Chronic back pain   . Heart murmur     slight  . Pneumonia     hx of  . Sleep apnea     does not wear CPAP  . CAD (coronary artery disease)     coronary calcifications 08/2013 CTA  . Spinal stenosis at L4-L5 level      Current outpatient prescriptions:  .  albuterol (PROVENTIL HFA;VENTOLIN HFA) 108 (90 Base) MCG/ACT inhaler, Inhale 2 puffs into the lungs every 6 (six) hours as needed for wheezing., Disp: 3 Inhaler, Rfl: 1 .  amLODipine (NORVASC) 10 MG tablet, Take 1 tablet (10 mg total) by  mouth daily., Disp: 90 tablet, Rfl: 3 .  aspirin EC 81 MG tablet, Take 1 tablet (81 mg total) by mouth daily., Disp: 30 tablet, Rfl: 0 .  atorvastatin (LIPITOR) 20 MG tablet, TAKE 1 TABLET (20 MG TOTAL) BY MOUTH DAILY., Disp: 90 tablet, Rfl: 3 .  atorvastatin (LIPITOR) 20 MG tablet, TAKE 1 TABLET BY MOUTH DAILY, Disp: 90 tablet, Rfl: 1 .  B Complex-C (SUPER B COMPLEX PO), Take by mouth., Disp: , Rfl:  .  divalproex (DEPAKOTE) 500 MG DR tablet, Take 4 tablets (2,000 mg total) by mouth at bedtime. (Patient taking differently: Take 1,500 mg by mouth at bedtime. Pt take 3 tablets total 1,500 mg), Disp: 480 tablet, Rfl: 3 .  fluticasone (FLONASE) 50 MCG/ACT nasal spray, Place 2 sprays into both nostrils daily., Disp: 48 g, Rfl: 3 .  Fluticasone-Salmeterol (ADVAIR DISKUS) 500-50 MCG/DOSE AEPB, Inhale 1 puff into the lungs every 12 (twelve) hours., Disp: 180 each, Rfl: 3 .  ibuprofen (ADVIL,MOTRIN) 100 MG/5ML suspension, Take 200 mg by mouth as needed., Disp: , Rfl:  .  lamoTRIgine (LAMICTAL) 200 MG tablet, Take 1 tablet (200 mg total) by mouth every morning., Disp: 90 tablet, Rfl: 3 .  lisinopril-hydrochlorothiazide (PRINZIDE,ZESTORETIC) 20-12.5 MG tablet, Take 1 tablet by mouth every morning., Disp:  90 tablet, Rfl: 0 .  metoprolol tartrate (LOPRESSOR) 25 MG tablet, Take 0.5 tablets (12.5 mg total) by mouth daily., Disp: 45 tablet, Rfl: 3  Review of Systems cough, wheezing, dyspnea. Denies any sputum production, hemoptysis. Denies any nausea, vomiting, diarrhea, constipation. Denies any chest pain, palpitations. All other review of systems negative     Objective:   Physical Exam Blood pressure 130/68, pulse 49, height 5\' 3"  (1.6 m), weight 142 lb (64.411 kg), SpO2 99 %. Gen: No apparent distress Neuro: No gross focal deficits. Neck: No JVD, lymphadenopathy, thyromegaly. RS: Clear, no wheeze, crackles CVS: S1-S2 heard, no murmurs rubs gallops. Abdomen: Soft, positive bowel sounds. Extremities:  No edema.    Assessment & Plan:   #1 Asthma. Currently on Advair 500/50. He is using his rescue inhaler up to 4 times a day mostly because he does not want to get short of breath when he is doing public speaking. I will evaluate with PFTs and IgE levels. He has a recent CBC which did not show any eosinophilia.  #2 OSA Non compliant with therapy. Will order a split night sleep study.  # 3 Smoking. Cessation discussed.he had attempted multiple times in the past but without any success. He is okay for another referral back to cessation clinic. Time spent in counseling- 5 mins.  Plan: - PFTs, IgE levels - Split night sleep study - Smoking cessation clinic.  Marshell Garfinkel MD [facility] Pager [phone] If no answer or after 3pm call: [phone] 02/27/2016, 3:24 PM

## 2016-02-27 NOTE — Patient Instructions (Signed)
We will schedule you for pulmonary function tests and a sleep study. We will get blood test today to check IgE levels. Continue using the Advair as prescribed. We will refer you to smoking cessation clinic.

## 2016-02-28 ENCOUNTER — Telehealth: Payer: Self-pay | Admitting: Pulmonary Disease

## 2016-02-28 LAB — IGE: IgE (Immunoglobulin E), Serum: 2258 kU/L — ABNORMAL HIGH (ref <115)

## 2016-02-28 NOTE — Telephone Encounter (Signed)
Spoke with the pt  He wants [place] to call ins to check on price on PFT  I called and spoke with rep at [facility]  She states PFT will cost pt a 15$ copay  Pt aware  Nothing further needed

## 2016-02-29 ENCOUNTER — Ambulatory Visit (INDEPENDENT_AMBULATORY_CARE_PROVIDER_SITE_OTHER): Payer: PPO | Admitting: Pulmonary Disease

## 2016-02-29 DIAGNOSIS — J4540 Moderate persistent asthma, uncomplicated: Secondary | ICD-10-CM | POA: Diagnosis not present

## 2016-02-29 LAB — PULMONARY FUNCTION TEST
DL/VA % pred: 96 %
DL/VA: 4.15 ml/min/mmHg/L
DLCO cor % pred: 79 %
DLCO cor: 20.86 ml/min/mmHg
DLCO unc % pred: 74 %
DLCO unc: 19.65 ml/min/mmHg
FEF 25-75 Post: 1.47 L/sec
FEF 25-75 Pre: 1.31 L/sec
FEF2575-%Change-Post: 12 %
FEF2575-%Pred-Post: 64 %
FEF2575-%Pred-Pre: 57 %
FEV1-%Change-Post: 1 %
FEV1-%Pred-Post: 77 %
FEV1-%Pred-Pre: 76 %
FEV1-Post: 2.22 L
FEV1-Pre: 2.17 L
FEV1FVC-%Change-Post: 7 %
FEV1FVC-%Pred-Pre: 92 %
FEV6-%Change-Post: -4 %
FEV6-%Pred-Post: 81 %
FEV6-%Pred-Pre: 85 %
FEV6-Post: 2.97 L
FEV6-Pre: 3.11 L
FEV6FVC-%Change-Post: 1 %
FEV6FVC-%Pred-Post: 104 %
FEV6FVC-%Pred-Pre: 103 %
FVC-%Change-Post: -4 %
FVC-%Pred-Post: 78 %
FVC-%Pred-Pre: 82 %
FVC-Post: 3.02 L
FVC-Pre: 3.18 L
Post FEV1/FVC ratio: 73 %
Post FEV6/FVC ratio: 99 %
Pre FEV1/FVC ratio: 68 %
Pre FEV6/FVC Ratio: 98 %
RV % pred: 121 %
RV: 2.57 L
TLC % pred: 95 %
TLC: 5.85 L

## 2016-02-29 NOTE — Progress Notes (Signed)
PFT done today. 

## 2016-03-04 DIAGNOSIS — H35372 Puckering of macula, left eye: Secondary | ICD-10-CM | POA: Diagnosis not present

## 2016-03-04 DIAGNOSIS — H10413 Chronic giant papillary conjunctivitis, bilateral: Secondary | ICD-10-CM | POA: Diagnosis not present

## 2016-03-04 DIAGNOSIS — H2513 Age-related nuclear cataract, bilateral: Secondary | ICD-10-CM | POA: Diagnosis not present

## 2016-03-04 NOTE — Progress Notes (Signed)
Quick Note:  LMOM TCB x1. ______ 

## 2016-03-05 ENCOUNTER — Other Ambulatory Visit: Payer: Self-pay

## 2016-03-05 ENCOUNTER — Ambulatory Visit (HOSPITAL_COMMUNITY): Payer: PPO | Attending: Cardiology

## 2016-03-05 DIAGNOSIS — G4733 Obstructive sleep apnea (adult) (pediatric): Secondary | ICD-10-CM | POA: Diagnosis not present

## 2016-03-05 DIAGNOSIS — I350 Nonrheumatic aortic (valve) stenosis: Secondary | ICD-10-CM | POA: Diagnosis not present

## 2016-03-05 DIAGNOSIS — I119 Hypertensive heart disease without heart failure: Secondary | ICD-10-CM | POA: Insufficient documentation

## 2016-03-05 DIAGNOSIS — I2510 Atherosclerotic heart disease of native coronary artery without angina pectoris: Secondary | ICD-10-CM

## 2016-03-05 DIAGNOSIS — Z720 Tobacco use: Secondary | ICD-10-CM | POA: Diagnosis not present

## 2016-03-17 ENCOUNTER — Telehealth: Payer: Self-pay

## 2016-03-17 NOTE — Telephone Encounter (Signed)
Patient called to get an understanding of his Echo results.  Dr [name] was not available to go over the results with patient at time.

## 2016-03-17 NOTE — Telephone Encounter (Signed)
Patient aware of results and recommendations. °

## 2016-03-17 NOTE — Telephone Encounter (Signed)
Results moderate aortic stenosis so I would assume that he would want to do follow-up on this oblique in a year

## 2016-03-23 ENCOUNTER — Other Ambulatory Visit: Payer: Self-pay | Admitting: Family Medicine

## 2016-03-25 IMAGING — CR DG CHEST 2V
2 series · 2 of 2 positions shown · non-contrast
Comparison: 01/09/2014

CLINICAL DATA: Sinus infection, dizziness, shortness of breath,
cough

EXAM:
CHEST  2 VIEW

[view not recorded (1 of 2)]
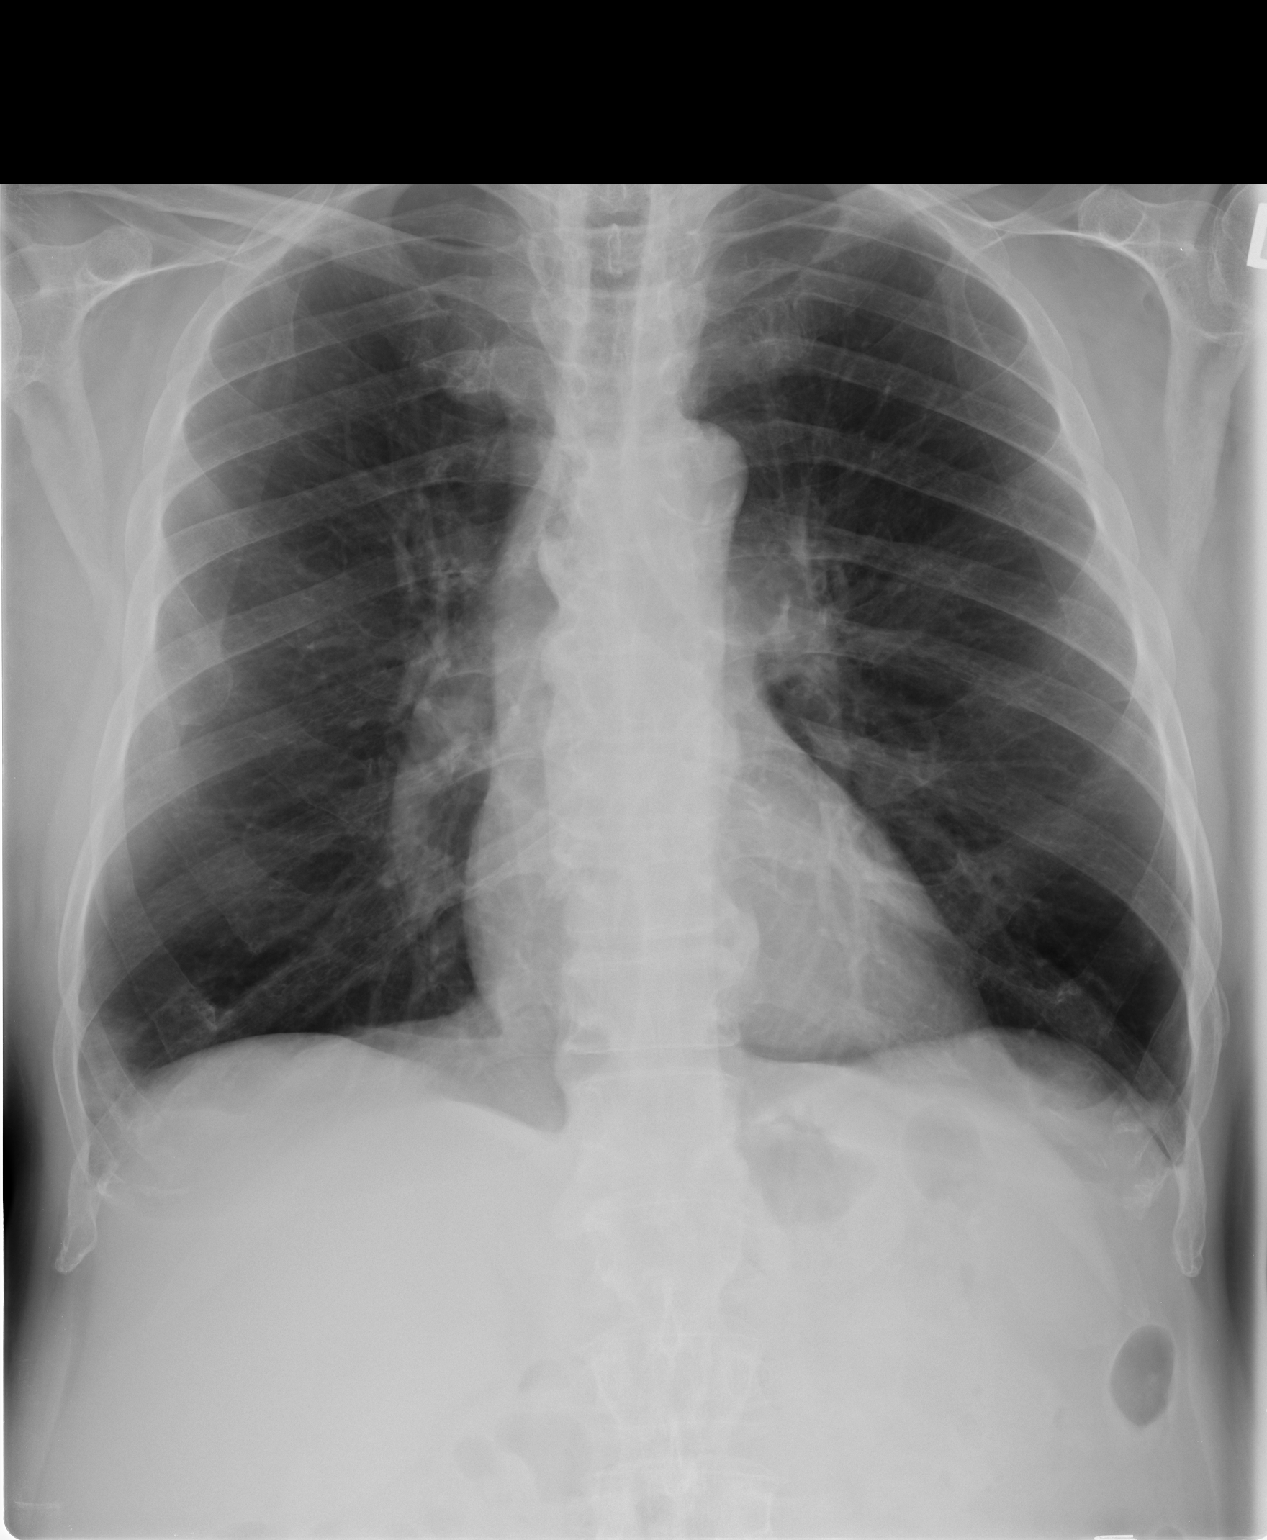

[view not recorded (2 of 2)]
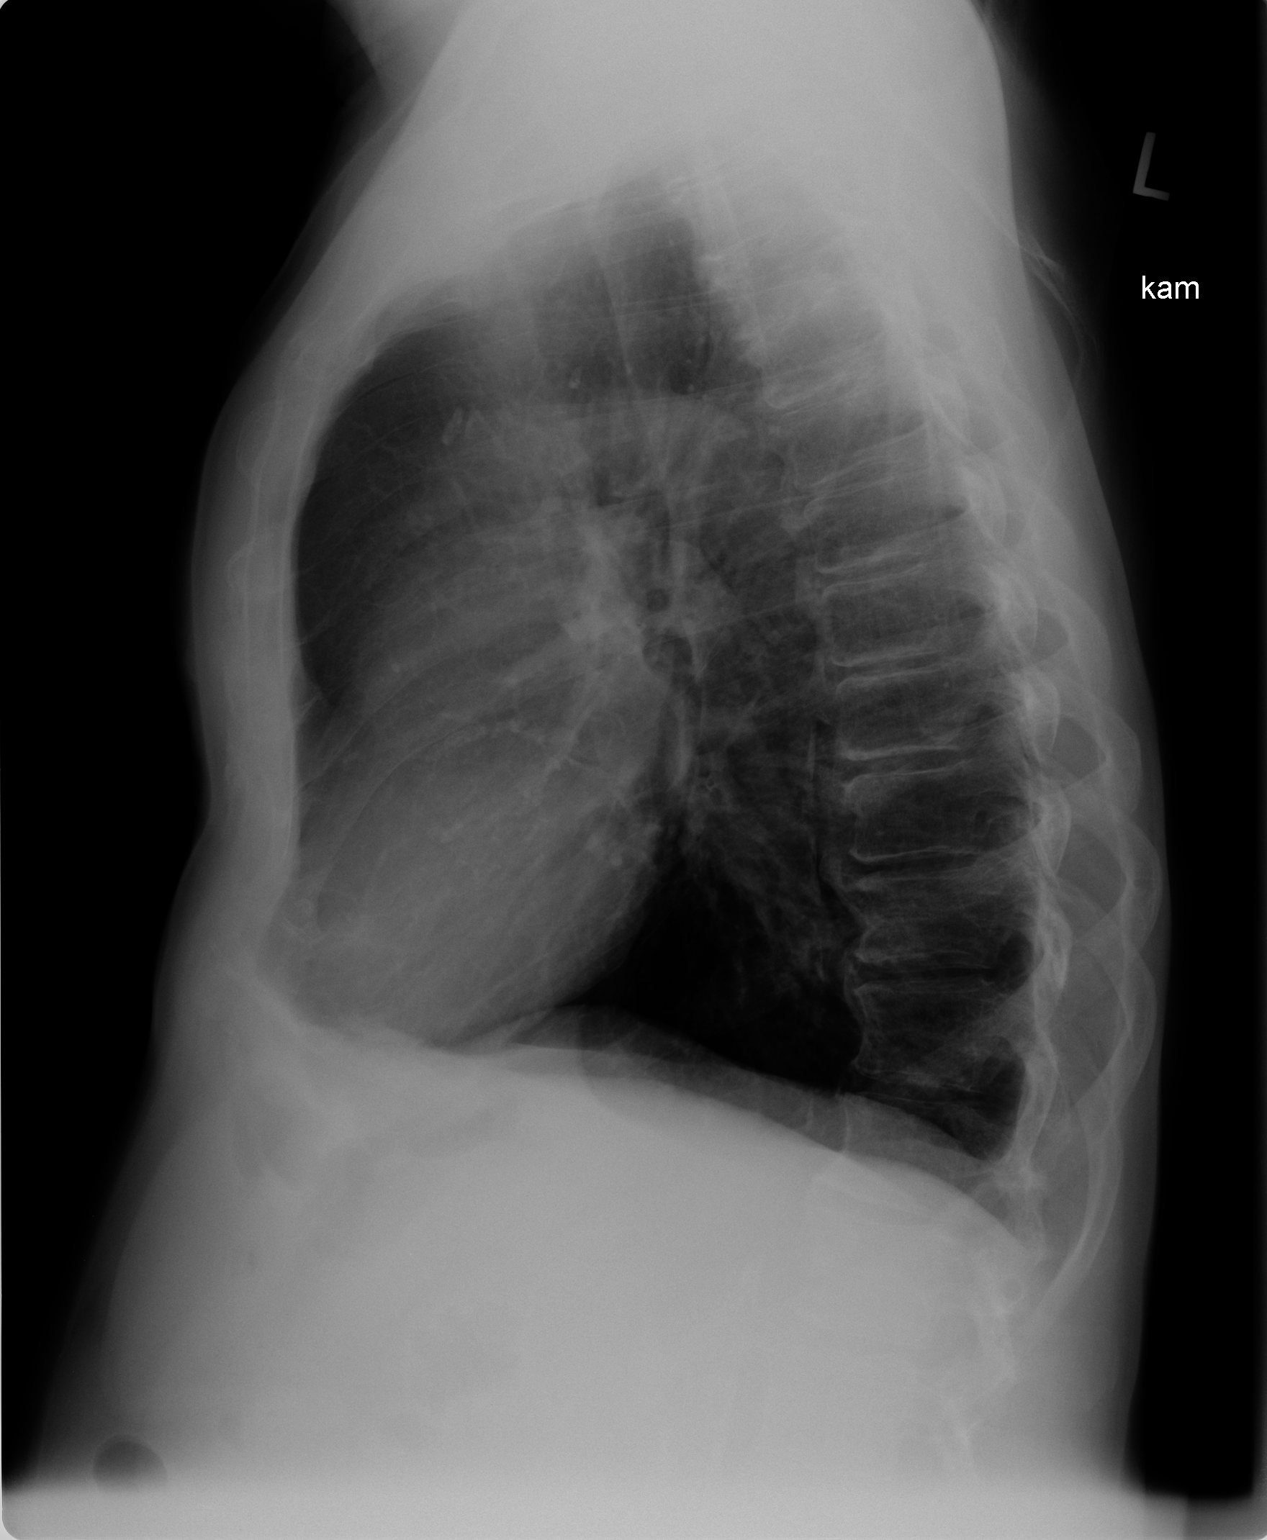

[2 of 2 positions shown; findings below may reference images not displayed]

FINDINGS: Hyperinflation noted compatible with background COPD/ emphysema.
Normal heart size and vascularity. Left lung is clear. Right
costophrenic angle demonstrates a subtle small focal opacity when
compared to the prior studies. This could represent an area of
atelectasis or developing airspace disease/early pneumonia. No
effusion or pneumothorax. Trachea midline. Degenerative changes of
the spine. Atherosclerosis noted of the aorta.
IMPRESSION: Right lower lobe peripheral subtle small focal opacity compatible
with atelectasis or developing airspace disease. Recommend
radiographic follow-up to document resolution.

## 2016-04-28 DIAGNOSIS — F3174 Bipolar disorder, in full remission, most recent episode manic: Secondary | ICD-10-CM | POA: Diagnosis not present

## 2016-04-28 DIAGNOSIS — F3176 Bipolar disorder, in full remission, most recent episode depressed: Secondary | ICD-10-CM | POA: Diagnosis not present

## 2016-04-29 ENCOUNTER — Ambulatory Visit (HOSPITAL_BASED_OUTPATIENT_CLINIC_OR_DEPARTMENT_OTHER): Payer: PPO | Attending: Pulmonary Disease | Admitting: Pulmonary Disease

## 2016-04-29 VITALS — Ht 66.0 in | Wt 142.0 lb

## 2016-04-29 DIAGNOSIS — G4733 Obstructive sleep apnea (adult) (pediatric): Secondary | ICD-10-CM | POA: Diagnosis not present

## 2016-04-29 DIAGNOSIS — Z79899 Other long term (current) drug therapy: Secondary | ICD-10-CM | POA: Insufficient documentation

## 2016-05-06 DIAGNOSIS — G4730 Sleep apnea, unspecified: Secondary | ICD-10-CM | POA: Diagnosis not present

## 2016-05-06 NOTE — Procedures (Signed)
Patient Name: Nature, Vogelsang Date: 04/29/2016 Gender: Male D.O.B: 1951/03/27 Age (years): 36 Referring Provider: Marshell Garfinkel Height (inches): 66 Interpreting Physician: Kara Mead MD, ABSM Weight (lbs): 142 RPSGT: Zadie Rhine BMI: 23 MRN: [id number] Neck Size: 15.50   CLINICAL INFORMATION Sleep Study Type: Split Night CPAP Indication for sleep study: OSA Epworth Sleepiness Score: 11   SLEEP STUDY TECHNIQUE As per the AASM Manual for the Scoring of Sleep and Associated Events v2.3 (April 2016) with a hypopnea requiring 4% desaturations. The channels recorded and monitored were frontal, central and occipital EEG, electrooculogram (EOG), submentalis EMG (chin), nasal and oral airflow, thoracic and abdominal wall motion, anterior tibialis EMG, snore microphone, electrocardiogram, and pulse oximetry. Continuous positive airway pressure (CPAP) was initiated when the patient met split night criteria and was titrated according to treat sleep-disordered breathing.   MEDICATIONS Medications taken by the patient : ALBUTEROL, DEPAKOTE, ADVAIR DISKUS, IBUPROFEN  Medications administered by patient during sleep study : No sleep medicine administered.   RESPIRATORY PARAMETERS Diagnostic Total AHI (/hr): 24.2 RDI (/hr): 43.9 OA Index (/hr): 5.5 CA Index (/hr): 0.0 REM AHI (/hr): 50.7 NREM AHI (/hr): 8.1 Supine AHI (/hr): 24.2 Non-supine AHI (/hr): N/A Min O2 Sat (%): 68.00 Mean O2 (%): 91.42 Time below 88% (min): 10.3   Titration Optimal Pressure (cm): 17 AHI at Optimal Pressure (/hr): 3.2 Min O2 at Optimal Pressure (%): 93.0 Supine % at Optimal (%): 100 Sleep % at Optimal (%): 100     SLEEP ARCHITECTURE The recording time for the entire night was 389.0 minutes. During a baseline period of 155.5 minutes, the patient slept for 119.0 minutes in REM and nonREM, yielding a sleep efficiency of 76.5%. Sleep onset after lights out was 25.2 minutes with a REM latency of 21.5 minutes.  The patient spent 2.52% of the night in stage N1 sleep, 58.40% in stage N2 sleep, 1.26% in stage N3 and 37.82% in REM. During the titration period of 228.1 minutes, the patient slept for 192.8 minutes in REM and nonREM, yielding a sleep efficiency of 84.5%. Sleep onset after CPAP initiation was 5.8 minutes with a REM latency of 78.0 minutes. The patient spent 5.97% of the night in stage N1 sleep, 41.90% in stage N2 sleep, 0.00% in stage N3 and 52.14% in REM.   CARDIAC DATA The 2 lead EKG demonstrated sinus rhythm. The mean heart rate was 42.32 beats per minute. Other EKG findings include: None.   LEG MOVEMENT DATA The total Periodic Limb Movements of Sleep (PLMS) were 0. The PLMS index was 0.00 .    IMPRESSIONS Moderate obstructive sleep apnea occurred during the diagnostic portion of the study(AHI = 24.2/hour). An optimal PAP pressure was selected for this patient ( 17 cm of water) No significant central sleep apnea occurred during the diagnostic portion of the study (CAI = 0.0/hour). Severe oxygen desaturation was noted during the diagnostic portion of the study (Min O2 = 68.00%). No snoring was audible during the diagnostic portion of the study. No cardiac abnormalities were noted during this study. Clinically significant periodic limb movements did not occur during sleep.   DIAGNOSIS Obstructive Sleep Apnea (327.23 [G47.33 ICD-10])    RECOMMENDATIONS Trial of CPAP therapy on 17 cm H2O with a Small size Resmed Full Face Mask AirFit F20 mask and heated humidification. Alternatively auto CPAP 10-17 cam can be trialled Avoid alcohol, sedatives and other CNS depressants that may worsen sleep apnea and disrupt normal sleep architecture. Sleep hygiene should be reviewed to assess factors that  may improve sleep quality. Weight management and regular exercise should be initiated or continued. Return to [facility] Center for re-evaluation after 4 weeks of therapy   Kara Mead MD.  [facility]. [place]   05/06/2016

## 2016-05-12 ENCOUNTER — Telehealth: Payer: Self-pay | Admitting: Family Medicine

## 2016-05-12 ENCOUNTER — Other Ambulatory Visit: Payer: Self-pay

## 2016-05-12 DIAGNOSIS — G4733 Obstructive sleep apnea (adult) (pediatric): Secondary | ICD-10-CM

## 2016-05-12 NOTE — Telephone Encounter (Signed)
Received signed request to send most recent labs to Dr. Chucky May. Labs sent to [phone].

## 2016-05-19 ENCOUNTER — Other Ambulatory Visit: Payer: Self-pay | Admitting: Family Medicine

## 2016-05-21 ENCOUNTER — Ambulatory Visit (INDEPENDENT_AMBULATORY_CARE_PROVIDER_SITE_OTHER): Payer: PPO | Admitting: Family Medicine

## 2016-05-21 ENCOUNTER — Encounter: Payer: Self-pay | Admitting: Family Medicine

## 2016-05-21 VITALS — BP 120/62 | HR 48 | Wt 143.2 lb

## 2016-05-21 DIAGNOSIS — M461 Sacroiliitis, not elsewhere classified: Secondary | ICD-10-CM | POA: Diagnosis not present

## 2016-05-21 NOTE — Patient Instructions (Signed)
Heat  for 20 minutes and then after that do you stretching and also take 2 Aleve twice per day for the next couple weeks

## 2016-05-21 NOTE — Progress Notes (Signed)
   Subjective:    Patient ID: Gregory [name], male    DOB: Aug 03, 1951, 65 y.o.   MRN: [id number]  HPI He complains of right-sided low back pain that he notes especially when he gets up and down from a chair or in and out of the car. He does complain of some weakness on that side but no numbness, tingling. He has been using hot and cold as well as minimal doses of Advil. He has a previous history of spinal stenosis.   Review of Systems     Objective:   Physical Exam Previous surgical scars noted over his back. He is slightly tender to palpation over the right SI joint. Provocative testing with stork and Francesca Jewett was questionable. Negative straight leg raising. Normal DTRs.       Assessment & Plan:  Sacroiliitis ([name])  His symptoms are most consistent with sacroiliitis. Recommended heat and stretching as well as 2 Aleve twice per day. Discussed possible physical therapy. Explained that this does not at this point looks like spinal stenosis or herniated disc issue.

## 2016-05-29 ENCOUNTER — Ambulatory Visit (INDEPENDENT_AMBULATORY_CARE_PROVIDER_SITE_OTHER): Payer: PPO | Admitting: Pulmonary Disease

## 2016-05-29 ENCOUNTER — Encounter: Payer: Self-pay | Admitting: Pulmonary Disease

## 2016-05-29 VITALS — BP 118/78 | HR 51 | Ht 65.5 in | Wt 142.0 lb

## 2016-05-29 DIAGNOSIS — G4733 Obstructive sleep apnea (adult) (pediatric): Secondary | ICD-10-CM | POA: Diagnosis not present

## 2016-05-29 DIAGNOSIS — J4540 Moderate persistent asthma, uncomplicated: Secondary | ICD-10-CM | POA: Diagnosis not present

## 2016-05-29 NOTE — Progress Notes (Signed)
Subjective:    Patient ID: Gregory [name], male    DOB: [birth year], 65 y.o.   MRN: [id number]  PROBLEM LIST Moderate persistent asthma OSA  HPI Gregory [name] First is a 65 year oldHistory of childhood asthma. He was diagnosed at the age of 65. He is never seen a pulmonologist or had lung function tests. He is currently maintained on Advair 500/50. He is also on albuterol rescue inhaler. She uses it only several times a year during spring and fall allergy season. However he is using it now 3-4 times every day since she gets short of breath with public speaking. he has seasonal allergies, rhinitis, postnasal drip. Denies GERD symptoms.  He was diagnosed with sleep apnea 8 years ago. He used CPAP for a few years but then stopped because of discomfort with the mask. He has symptoms of daily snoring, daytime somnolence, fatigue.  DATA: PFTs 02/29/16 FVC 2.18 (82%) FEV1 2.17 (76%) F/F 68 TLC 95% DLCO 74% Minimal obstructive airway disease, minimal reduction in diffusion capacity.  Sleep study 05/06/16 Moderate sleep apnea. AHI 24. Recommend CPAP at 17.  IgE 02/27/16 2258  Social History: 1 ppd smoker. Used to drink heavily but is now abstinent.  Family History: Father-skin cancer, stroke, drug abuse Brother- diabetes, hypertension, drug abuse  Past Medical History  Diagnosis Date  . Allergy   . Hypertension   . Bipolar disorder ([place])   . Alcohol abuse   . Tobacco use disorder   . Asthma   . Aortic stenosis   . Neuromuscular disorder ([place])   . Family history of skin cancer   . Hyperlipidemia   . RBBB     a. 7.2012 low risk nuclear exam, sm area of apical ischemia.  . Carotid artery occlusion   . Claudication ([place])   . Smoker   . Chronic back pain   . Heart murmur     slight  . Pneumonia     hx of  . Sleep apnea     does not wear CPAP  . CAD (coronary artery disease)     coronary calcifications 08/2013 CTA  . Spinal stenosis at L4-L5 level      Current outpatient  prescriptions:  .  albuterol (PROVENTIL HFA;VENTOLIN HFA) 108 (90 Base) MCG/ACT inhaler, Inhale 2 puffs into the lungs every 6 (six) hours as needed for wheezing., Disp: 3 Inhaler, Rfl: 1 .  amLODipine (NORVASC) 10 MG tablet, Take 1 tablet (10 mg total) by mouth daily., Disp: 90 tablet, Rfl: 3 .  aspirin EC 81 MG tablet, Take 1 tablet (81 mg total) by mouth daily., Disp: 30 tablet, Rfl: 0 .  atorvastatin (LIPITOR) 20 MG tablet, TAKE 1 TABLET (20 MG TOTAL) BY MOUTH DAILY., Disp: 90 tablet, Rfl: 3 .  B Complex-C (SUPER B COMPLEX PO), Take by mouth., Disp: , Rfl:  .  divalproex (DEPAKOTE) 500 MG DR tablet, Take 4 tablets (2,000 mg total) by mouth at bedtime. (Patient taking differently: Take 1,500 mg by mouth at bedtime. Pt take 3 tablets total 1,500 mg), Disp: 480 tablet, Rfl: 3 .  fluticasone (FLONASE) 50 MCG/ACT nasal spray, USE 2 SPRAYS IN EACH NOSTRIL ONCE DAILY, Disp: 16 g, Rfl: 1 .  Fluticasone-Salmeterol (ADVAIR DISKUS) 500-50 MCG/DOSE AEPB, Inhale 1 puff into the lungs every 12 (twelve) hours., Disp: 180 each, Rfl: 3 .  ibuprofen (ADVIL,MOTRIN) 100 MG/5ML suspension, Take 200 mg by mouth as needed., Disp: , Rfl:  .  lamoTRIgine (LAMICTAL) 200 MG tablet, Take 1 tablet (200 mg  total) by mouth every morning., Disp: 90 tablet, Rfl: 3 .  lisinopril-hydrochlorothiazide (PRINZIDE,ZESTORETIC) 20-12.5 MG tablet, TAKE 1 TABLET BY MOUTH EVERY MORNING., Disp: 90 tablet, Rfl: 3 .  metoprolol tartrate (LOPRESSOR) 25 MG tablet, Take 0.5 tablets (12.5 mg total) by mouth daily., Disp: 45 tablet, Rfl: 3  Review of Systems cough, wheezing, dyspnea. Denies any sputum production, hemoptysis. Denies any nausea, vomiting, diarrhea, constipation. Denies any chest pain, palpitations. All other review of systems negative     Objective:   Physical Exam Blood pressure 118/78, pulse 51, height 5' 5.5" (1.664 m), weight 142 lb (64.411 kg), SpO2 93 %. Gen: No apparent distress Neuro: No gross focal deficits. Neck:  No JVD, lymphadenopathy, thyromegaly. RS: Clear, no wheeze, crackles CVS: S1-S2 heard, no murmurs rubs gallops. Abdomen: Soft, positive bowel sounds. Extremities: No edema.    Assessment & Plan:   #1 Asthma. Elevated IgE Currently on Advair 500/50. He is using his rescue inhaler up to 4 times a day mostly because he does not want to get short of breath when he is doing public speaking.  Otherwise his symptoms are stable. CBC shows no eosinophilia. IgE levels are highly elevated at 2258. His imaging does not show any bronchiectasis so he would not need workup for ABPA. We'll continue the inhalers as prescribed.  #2 OSA Moderate sleep apnea on recent sleep study. CPAP ordered  # 3 Smoking. Cessation discussed.he had attempted multiple times in the past but without any success. He is scheduled to attend a smoking cessation clinic.  Plan: - Continue Advair, albuterol rescue inhaler - Start CPAP for sleep apnea - Smoking cessation clinic.  Marshell Garfinkel MD [place] Pager [phone] If no answer or after 3pm call: [phone] 05/29/2016, 1:47 PM

## 2016-05-29 NOTE — Patient Instructions (Signed)
Continue using your Advair. We will follow up on CPAP order. Follow-up with smoking cessation clinic.  Return to clinic in 6 months.

## 2016-06-06 ENCOUNTER — Telehealth: Payer: Self-pay | Admitting: Family Medicine

## 2016-06-06 NOTE — Telephone Encounter (Signed)
The only thing I can think of would be Advil/Aleve plus Tylenol. It continued difficulty we will need refer to orthopedics.

## 2016-06-06 NOTE — Telephone Encounter (Signed)
Pt states pain getting worse, did the exercises & doubled Advil & heating pad, all not working.  He is going to [employer] with grand kids and wants some relief. He states he is recovering alcoholic and can't take pain meds, wants to know if you think a muscle relaxer would help & if so please call in ASAP

## 2016-06-06 NOTE — Telephone Encounter (Signed)
Left word for word message on machine  

## 2016-06-25 ENCOUNTER — Ambulatory Visit (INDEPENDENT_AMBULATORY_CARE_PROVIDER_SITE_OTHER): Payer: PPO | Admitting: Family Medicine

## 2016-06-25 ENCOUNTER — Encounter: Payer: Self-pay | Admitting: Family Medicine

## 2016-06-25 VITALS — BP 120/50 | HR 55 | Temp 98.2°F | Wt 142.8 lb

## 2016-06-25 DIAGNOSIS — K148 Other diseases of tongue: Secondary | ICD-10-CM

## 2016-06-25 DIAGNOSIS — J0120 Acute ethmoidal sinusitis, unspecified: Secondary | ICD-10-CM

## 2016-06-25 DIAGNOSIS — R197 Diarrhea, unspecified: Secondary | ICD-10-CM | POA: Diagnosis not present

## 2016-06-25 MED ORDER — AMOXICILLIN 875 MG PO TABS: 875.0000 mg | ORAL_TABLET | Freq: Two times a day (BID) | ORAL | Status: DC

## 2016-06-25 NOTE — Patient Instructions (Signed)
Take all the antibiotic and if not totally back to normal when you finish let me know. You can use Imodium for the diarrhea. Eat anything he want.Kermit Balo handwashing. If the tongue lesion doesn't go away in the next week or 2, come back

## 2016-06-25 NOTE — Progress Notes (Signed)
   Subjective:    Patient ID: Gregory [name], male    DOB: 1951-12-13, 65 y.o.   MRN: [id number]  HPI He complains of a 10 day history of started with postnasal drainage followed by earache, neck discomfort, dry cough, upper tooth pain as well as sinus pressure. He has been using Aleve as well as guaifenesin. 2 days ago he started having difficulty with diarrhea but no cramping, fever, chills, black tarry stools. No recent exposure to latex, well water or antibiotics. He also complains of a lesion on his tongue in the last several days.   Review of Systems     Objective:   Physical Exam Alert and in no distress. Exam of his tongue does show a slightly raised palpable lesion left of midline in the midportion of the tongue. Tympanic membranes and canals are normal. Pharyngeal area is normal. Neck is supple without adenopathy or thyromegaly. Cardiac exam shows a regular sinus rhythm without murmurs or gallops. Lungs are clear to auscultation. Abdominal exam shows active bowel sounds without masses or tenderness.      Assessment & Plan:  Acute ethmoidal sinusitis, recurrence not specified - Plan: amoxicillin (AMOXIL) 875 MG tablet  Diarrhea, unspecified type  Tongue lesion He will call when he finishes the antibiotic not totally back to normal. Recommend Imodium for the diarrhea. If the tongue lesion has not diminished within 10 days to 2 weeks, he is to return for a recheck and possible referral.

## 2016-07-03 ENCOUNTER — Other Ambulatory Visit: Payer: Self-pay | Admitting: Family Medicine

## 2016-10-24 ENCOUNTER — Ambulatory Visit (INDEPENDENT_AMBULATORY_CARE_PROVIDER_SITE_OTHER): Payer: PPO | Admitting: Medical

## 2016-10-24 ENCOUNTER — Encounter: Payer: Self-pay | Admitting: Medical

## 2016-10-24 VITALS — BP 110/60 | HR 67 | Temp 98.2°F | Wt 146.0 lb

## 2016-10-24 DIAGNOSIS — H61899 Other specified disorders of external ear, unspecified ear: Secondary | ICD-10-CM | POA: Diagnosis not present

## 2016-10-24 DIAGNOSIS — J0100 Acute maxillary sinusitis, unspecified: Secondary | ICD-10-CM

## 2016-10-24 DIAGNOSIS — J4541 Moderate persistent asthma with (acute) exacerbation: Secondary | ICD-10-CM

## 2016-10-24 DIAGNOSIS — F17200 Nicotine dependence, unspecified, uncomplicated: Secondary | ICD-10-CM | POA: Diagnosis not present

## 2016-10-24 DIAGNOSIS — H9390 Unspecified disorder of ear, unspecified ear: Secondary | ICD-10-CM

## 2016-10-24 MED ORDER — AMOXICILLIN 500 MG PO TABS: ORAL_TABLET | ORAL | 0 refills | Status: DC

## 2016-10-24 MED ORDER — CEFUROXIME AXETIL 500 MG PO TABS: 500.0000 mg | ORAL_TABLET | Freq: Two times a day (BID) | ORAL | 0 refills | Status: DC

## 2016-10-24 MED ORDER — FLUTICASONE-SALMETEROL 500-50 MCG/DOSE IN AEPB: 1.0000 | INHALATION_SPRAY | Freq: Two times a day (BID) | RESPIRATORY_TRACT | 1 refills | Status: DC

## 2016-10-24 NOTE — Addendum Note (Signed)
Addended by: Carlena Hurl on: 10/24/2016 02:50 PM   Modules accepted: Orders

## 2016-10-24 NOTE — Progress Notes (Signed)
Subjective:  Gregory [name] is a 65 y.o. male who presents for possible sinus infection.   Here for sinus infection.  Has 2-3 week hx/o sinus pressure, headache, sore throat, post nasal drainage, cough, mucous in throat, fatigue. Gets sinus infections a few times per year.  Ran out of Advair so is a little wheezy.   He saw pulmonology back in June was advised he has allergic asthma, not COPD.   He is using Proventil a little more of late.  No other aggravating or relieving factors. No other complaint. No other aggravating or relieving factors.  No other c/o.  Past Medical History:  Diagnosis Date  . Alcohol abuse   . Allergy   . Aortic stenosis   . Asthma   . Bipolar disorder ([place])   . CAD (coronary artery disease)    coronary calcifications 08/2013 CTA  . Carotid artery occlusion   . Chronic back pain   . Claudication ([place])   . Family history of skin cancer   . Heart murmur    slight  . Hyperlipidemia   . Hypertension   . Neuromuscular disorder ([place])   . Pneumonia    hx of  . RBBB    a. 7.2012 low risk nuclear exam, sm area of apical ischemia.  . Sleep apnea    does not wear CPAP  . Smoker   . Spinal stenosis at L4-L5 level   . Tobacco use disorder     ROS as in subjective   Objective: BP 110/60   Pulse 67   Temp 98.2 F (36.8 C)   Wt 146 lb (66.2 kg)   SpO2 98%   BMI 23.93 kg/m   General appearance: Alert, WD/WN, no distress                             Skin: warm, no rash                           Head: +maxillary sinus tenderness,                            Eyes: conjunctiva normal, corneas clear, PERRLA                            Ears: pearly TMs, external ear canals normal, left superior pinna with 2 separate somewhat crusty 54mm raised lesions, approx 37mm diameter                          Nose: septum midline, turbinates swollen, with erythema and clear discharge             Mouth/throat: MMM, tongue normal, mild pharyngeal erythema       Neck: supple, no adenopathy, no thyromegaly, non tender                          Lungs: decreased breath sounds, faint bilat wheezes, no rales, or rhonchi      Assessment  Encounter Diagnoses  Name Primary?  . Acute maxillary sinusitis, recurrence not specified Yes  . Moderate persistent extrinsic asthma with acute exacerbation   . Smoker   . Ear lesion       Plan: Begin Ceftin for sinusitis, refilled Advair, but reiterated the need  for him to check with pharmacy as he had refills already.  C/t albuterol prn.  discussed supportive care for sinuitis.    QUIT Tobacco, cessation advised.  He is not ready to quit  Advised he see dermatology regarding the ear lesions.   He will let us know  Gregory [name] was seen today for cold systoms.  Diagnoses and all orders for this visit:  Acute maxillary sinusitis, recurrence not specified  Moderate persistent extrinsic asthma with acute exacerbation  Smoker  Ear lesion  Other orders -     Fluticasone-Salmeterol (ADVAIR DISKUS) 500-50 MCG/DOSE AEPB; Inhale 1 puff into the lungs every 12 (twelve) hours. -     cefUROXime (CEFTIN) 500 MG tablet; Take 1 tablet (500 mg total) by mouth 2 (two) times daily with a meal.   Patient was advised to call or return if worse or not improving in the next few days.    Patient voiced understanding of diagnosis, recommendations, and treatment plan.

## 2016-12-03 DIAGNOSIS — L814 Other melanin hyperpigmentation: Secondary | ICD-10-CM | POA: Diagnosis not present

## 2016-12-03 DIAGNOSIS — D1801 Hemangioma of skin and subcutaneous tissue: Secondary | ICD-10-CM | POA: Diagnosis not present

## 2016-12-03 DIAGNOSIS — D225 Melanocytic nevi of trunk: Secondary | ICD-10-CM | POA: Diagnosis not present

## 2016-12-03 DIAGNOSIS — L570 Actinic keratosis: Secondary | ICD-10-CM | POA: Diagnosis not present

## 2016-12-29 ENCOUNTER — Ambulatory Visit (INDEPENDENT_AMBULATORY_CARE_PROVIDER_SITE_OTHER): Payer: PPO | Admitting: Family Medicine

## 2016-12-29 ENCOUNTER — Encounter: Payer: Self-pay | Admitting: Family Medicine

## 2016-12-29 VITALS — BP 140/80 | HR 52 | Temp 97.6°F | Resp 16 | Wt 147.2 lb

## 2016-12-29 DIAGNOSIS — J0100 Acute maxillary sinusitis, unspecified: Secondary | ICD-10-CM

## 2016-12-29 DIAGNOSIS — J209 Acute bronchitis, unspecified: Secondary | ICD-10-CM

## 2016-12-29 DIAGNOSIS — F1021 Alcohol dependence, in remission: Secondary | ICD-10-CM | POA: Diagnosis not present

## 2016-12-29 MED ORDER — AMOXICILLIN 875 MG PO TABS: 875.0000 mg | ORAL_TABLET | Freq: Two times a day (BID) | ORAL | 0 refills | Status: DC

## 2016-12-29 NOTE — Progress Notes (Signed)
   Subjective:    Patient ID: Gregory [name], male    DOB: 10/17/1951, 66 y.o.   MRN: [id number]  HPI He complains of a three-week history of started with Gregory [name] followed by sinus pressure especially in the maxillary sinus area, PND with slight sore throat. Cough was productive initially but then became dry. He's also had some chills and fatigue. He continues to remain alcohol free. He is now about 2-1/2 years free of alcohol.   Review of Systems     Objective:   Physical Exam Alert and in no distress. Tympanic membranes and canals are normal. Pharyngeal area is normal. Neck is supple without adenopathy or thyromegaly. Cardiac exam shows a regular sinus rhythm without murmurs or gallops. Lungs are clear to auscultation. Nasal mucosa is slightly red with tenderness especially over maxillary sinuses.      Assessment & Plan:  Acute maxillary sinusitis, recurrence not specified - Plan: amoxicillin (AMOXIL) 875 MG tablet  Acute bronchitis, unspecified organism - Plan: amoxicillin (AMOXIL) 875 MG tablet  Recovering alcoholic in remission [facility]) He will call if continued difficulty with his symptoms.

## 2017-01-20 ENCOUNTER — Ambulatory Visit: Payer: Self-pay | Admitting: Pulmonary Disease

## 2017-02-05 ENCOUNTER — Other Ambulatory Visit: Payer: Self-pay | Admitting: Family Medicine

## 2017-02-06 ENCOUNTER — Other Ambulatory Visit: Payer: Self-pay | Admitting: Family Medicine

## 2017-02-06 DIAGNOSIS — I10 Essential (primary) hypertension: Secondary | ICD-10-CM

## 2017-02-16 ENCOUNTER — Ambulatory Visit: Payer: Self-pay | Admitting: Pulmonary Disease

## 2017-03-04 ENCOUNTER — Ambulatory Visit: Payer: Self-pay | Admitting: Pulmonary Disease

## 2017-03-15 ENCOUNTER — Other Ambulatory Visit: Payer: Self-pay | Admitting: Family Medicine

## 2017-03-16 ENCOUNTER — Ambulatory Visit: Payer: Self-pay | Admitting: Pulmonary Disease

## 2017-04-09 ENCOUNTER — Encounter: Payer: Self-pay | Admitting: Pulmonary Disease

## 2017-04-09 ENCOUNTER — Ambulatory Visit (INDEPENDENT_AMBULATORY_CARE_PROVIDER_SITE_OTHER): Payer: PPO | Admitting: Pulmonary Disease

## 2017-04-09 VITALS — BP 130/78 | HR 50 | Ht 65.5 in | Wt 143.4 lb

## 2017-04-09 DIAGNOSIS — G4733 Obstructive sleep apnea (adult) (pediatric): Secondary | ICD-10-CM | POA: Diagnosis not present

## 2017-04-09 DIAGNOSIS — J4521 Mild intermittent asthma with (acute) exacerbation: Secondary | ICD-10-CM | POA: Diagnosis not present

## 2017-04-09 MED ORDER — PREDNISONE 10 MG PO TABS: ORAL_TABLET | ORAL | 0 refills | Status: DC

## 2017-04-09 NOTE — Patient Instructions (Signed)
Will give you a quick prednisone taper starting at 40 mg. Reduced dose by 10 mg every 3 days Continue using the inhaler and Mucinex Continue the Benadryl at night and Flonase Give [place] a call if there is any change in symptoms or if there is any worsening cough, mucus production  Return to clinic in 3 months.

## 2017-04-09 NOTE — Addendum Note (Signed)
Addended by: Maryanna Shape A on: 04/09/2017 05:29 PM   Modules accepted: Orders

## 2017-04-09 NOTE — Progress Notes (Signed)
Gregory [name]    [id number]    [birth year]  Primary Care Physician:Gregory [name] Gregory Crumble, MD  Referring Physician: [name], MD [address] [place], [place] [address]  Chief complaint:  Follow up for  Moderate persistent asthma OSA  HPI: Gregory [name] is a 62 year oldHistory of childhood asthma. He was diagnosed at the age of 82. He is never seen a pulmonologist or had [name] function tests. He is currently maintained on Advair 500/50. He is also on albuterol rescue inhaler. She uses it only several times a year during spring and fall allergy season. However he is using it now 3-4 times every day since she gets short of breath with public speaking. he has seasonal allergies, rhinitis, postnasal drip. Denies GERD symptoms.  He was diagnosed with sleep apnea 8 years ago. He used CPAP for a few years but then stopped because of discomfort with the mask. He has symptoms of daily snoring, daytime somnolence, fatigue.  Interim History: His symptoms have worsened over the past 3 weeks due to pollen. He has more symptoms when he does yard work. He is using his albuterol inhaler up to 2-3 times per day and has complains of increasing congestion, post nasal drip, wheezing. He denies any purulent sputum, fevers, chills.  Outpatient Encounter Prescriptions as of 04/09/2017  Medication Sig  . albuterol (PROVENTIL HFA;VENTOLIN HFA) 108 (90 Base) MCG/ACT inhaler Inhale 2 puffs into the lungs every 6 (six) hours as needed for wheezing.  [name] amLODipine (NORVASC) 10 MG tablet TAKE 1 TABLET (10 MG TOTAL) BY MOUTH DAILY.  [name] aspirin EC 81 MG tablet Take 1 tablet (81 mg total) by mouth daily.  [name] atorvastatin (LIPITOR) 20 MG tablet TAKE 1 TABLET (20 MG TOTAL) BY MOUTH DAILY.  . divalproex (DEPAKOTE) 500 MG DR tablet Take 4 tablets (2,000 mg total) by mouth at bedtime. (Patient taking differently: Take 1,500 mg by mouth at bedtime. Pt take 3 tablets total 1,500 mg)  . fluticasone (FLONASE)  50 MCG/ACT nasal spray USE 2 SPRAYS IN EACH NOSTRIL ONCE DAILY  . Fluticasone-Salmeterol (ADVAIR DISKUS) 500-50 MCG/DOSE AEPB Inhale 1 puff into the lungs every 12 (twelve) hours.  [name] ibuprofen (ADVIL,MOTRIN) 100 MG/5ML suspension Take 200 mg by mouth as needed. Reported on 06/25/2016  . lamoTRIgine (LAMICTAL) 200 MG tablet Take 1 tablet (200 mg total) by mouth every morning.  [name] lisinopril-hydrochlorothiazide (PRINZIDE,ZESTORETIC) 20-12.5 MG tablet TAKE 1 TABLET BY MOUTH EVERY MORNING.  . metoprolol tartrate (LOPRESSOR) 25 MG tablet TAKE 0.5 TABLETS (12.5 MG TOTAL) BY MOUTH DAILY.  . Multiple Vitamins-Minerals (MULTIVITAMIN ADULTS 50+ PO) Take 1 tablet by mouth.  [name] amoxicillin (AMOXIL) 875 MG tablet Take 1 tablet (875 mg total) by mouth 2 (two) times daily. (Patient not taking: Reported on 04/09/2017)  . B Complex-C (SUPER B COMPLEX PO) Take by mouth.   No facility-administered encounter medications on file as of 04/09/2017.     Allergies as of 04/09/2017 - Review Complete 04/09/2017  Allergen Reaction Noted  . Codeine Anaphylaxis 10/18/2008    Past Medical History:  Diagnosis Date  . Alcohol abuse   . Allergy   . Aortic stenosis   . Asthma   . Bipolar disorder ([place])   . CAD (coronary artery disease)    coronary calcifications 08/2013 CTA  . Carotid artery occlusion   . Chronic back pain   . Claudication ([place])   . Family history of skin cancer   . Heart murmur  slight  . Hyperlipidemia   . Hypertension   . Neuromuscular disorder ([place])   . Pneumonia    hx of  . RBBB    a. 7.2012 low risk nuclear exam, sm area of apical ischemia.  . Sleep apnea    does not wear CPAP  . Smoker   . Spinal stenosis at L4-L5 level   . Tobacco use disorder     Past Surgical History:  Procedure Laterality Date  . ANAL FISTULECTOMY  09/25/11  . COLONOSCOPY  2009  . ELBOW SURGERY     bilaterally for cubital tunnel  . MAXIMUM ACCESS (MAS)POSTERIOR LUMBAR INTERBODY FUSION (PLIF) 1 LEVEL N/A  10/25/2014   Procedure: LUMBAR FOUR TO FIVE MAXIMUM ACCESS (MAS) POSTERIOR LUMBAR INTERBODY FUSION (PLIF) 1 LEVEL;  Surgeon: Eustace Moore, MD;  Location: [facility];  Service: Neurosurgery;  Laterality: N/A;  . TONSILLECTOMY  age 64  . US ECHOCARDIOGRAPHY  06/20/2010   EF 55-60%  . VASECTOMY     X 2  . VASECTOMY REVERSAL      Family History  Problem Relation Age of Onset  . Stroke Father 92  . Cancer Father     skin  . Drug abuse Father   . Hypertension Brother   . Diabetes Brother   . Drug abuse Brother   . Cancer Maternal Aunt     skin  . Cancer Maternal Grandmother     liver    Social History   Social History  . Marital status: Married    Spouse name: N/A  . Number of children: N/A  . Years of education: N/A   Occupational History  . Not on file.   Social History Main Topics  . Smoking status: Current Every Day Smoker    Packs/day: 1.00    Years: 15.00    Types: Cigarettes  . Smokeless tobacco: Never Used     Comment: Pt had stopped smoking X10 years/ currently smoking a 0.5 packs a day   . Alcohol use 46.8 oz/week    50 Glasses of wine, 28 Standard drinks or equivalent per week     Comment: 2 glasses of wine per night.  . Drug use: No  . Sexual activity: Yes   Other Topics Concern  . Not on file   Social History Narrative   Lives in [place] with wife.  Does not routinely exercise.  Sedentary r/t chronic lbp.    Review of systems: Review of Systems  Constitutional: Negative for fever and chills.  HENT: Negative.   Eyes: Negative for blurred vision.  Respiratory: as per HPI  Cardiovascular: Negative for chest pain and palpitations.  Gastrointestinal: Negative for vomiting, diarrhea, blood per rectum. Genitourinary: Negative for dysuria, urgency, frequency and hematuria.  Musculoskeletal: Negative for myalgias, back pain and joint pain.  Skin: Negative for itching and rash.  Neurological: Negative for dizziness, tremors, focal weakness, seizures and  loss of consciousness.  Endo/Heme/Allergies: Negative for environmental allergies.  Psychiatric/Behavioral: Negative for depression, suicidal ideas and hallucinations.  All other systems reviewed and are negative.  Physical Exam: Blood pressure 130/78, pulse (!) 50, height 5' 5.5" (1.664 m), weight 143 lb 6.4 oz (65 kg), SpO2 97 %. Gen:      No acute distress HEENT:  EOMI, sclera anicteric Neck:     No masses; no thyromegaly Lungs:    B/L exp wheeze; normal respiratory effort CV:         Regular rate and rhythm; no murmurs Abd:      +  bowel sounds; soft, non-tender; no palpable masses, no distension Ext:    No edema; adequate peripheral perfusion Skin:      Warm and dry; no rash Neuro: alert and oriented x 3 Psych: normal mood and affect  Data Reviewed: PFTs 02/29/16 FVC 2.18 (82%) FEV1 2.17 (76%) F/F 68 TLC 95% DLCO 74% Minimal obstructive airway disease, minimal reduction in diffusion capacity.  Sleep study 05/06/16 Moderate sleep apnea. AHI 24. Recommend CPAP at 17.  IgE 02/27/16 2258  Assessment:   #1 Asthma. Elevated IgE Currently on Advair 500/50. He is in the middle of a minor exacerbation due to seasonal allergies. We will give a short prednisone taper starting at 40 mg. He'll continue Benadryl at night and Flonase and Mucinex. I don't think he'll need antibiotics at present.  CBC shows no eosinophilia. IgE levels are highly elevated at 2258. His imaging does not show any bronchiectasis so he would not need workup for ABPA. We'll continue the inhalers as prescribed.  #2 OSA Moderate sleep apnea on recent sleep study. Continuing on CPAP  # 3 Smoking. Cessation discussed.he had attempted multiple times in the past but without any success. He is scheduled to attend a smoking cessation clinic.  Plan/Recommendations: - Prednisone taper. Starting at 40 mg. Reduce dose by 10 mg every 3 days - Continue Advair, albuterol - Continue CPAP - Smoking cessation.  Marshell Garfinkel MD [facility] Pager [phone] 04/09/2017, 5:11 PM  CC: Gregory Lung, MD

## 2017-05-06 ENCOUNTER — Other Ambulatory Visit: Payer: Self-pay | Admitting: Family Medicine

## 2017-05-13 DIAGNOSIS — F3174 Bipolar disorder, in full remission, most recent episode manic: Secondary | ICD-10-CM | POA: Diagnosis not present

## 2017-05-13 DIAGNOSIS — F3176 Bipolar disorder, in full remission, most recent episode depressed: Secondary | ICD-10-CM | POA: Diagnosis not present

## 2017-06-29 ENCOUNTER — Ambulatory Visit (INDEPENDENT_AMBULATORY_CARE_PROVIDER_SITE_OTHER): Payer: PPO | Admitting: Family Medicine

## 2017-06-29 ENCOUNTER — Encounter: Payer: Self-pay | Admitting: Family Medicine

## 2017-06-29 VITALS — BP 116/76 | HR 46 | Ht 66.0 in | Wt 146.0 lb

## 2017-06-29 DIAGNOSIS — Z0000 Encounter for general adult medical examination without abnormal findings: Secondary | ICD-10-CM

## 2017-06-29 DIAGNOSIS — E785 Hyperlipidemia, unspecified: Secondary | ICD-10-CM | POA: Diagnosis not present

## 2017-06-29 DIAGNOSIS — F17200 Nicotine dependence, unspecified, uncomplicated: Secondary | ICD-10-CM | POA: Diagnosis not present

## 2017-06-29 DIAGNOSIS — F429 Obsessive-compulsive disorder, unspecified: Secondary | ICD-10-CM | POA: Diagnosis not present

## 2017-06-29 DIAGNOSIS — J301 Allergic rhinitis due to pollen: Secondary | ICD-10-CM | POA: Diagnosis not present

## 2017-06-29 DIAGNOSIS — I10 Essential (primary) hypertension: Secondary | ICD-10-CM

## 2017-06-29 DIAGNOSIS — F1021 Alcohol dependence, in remission: Secondary | ICD-10-CM | POA: Diagnosis not present

## 2017-06-29 DIAGNOSIS — I350 Nonrheumatic aortic (valve) stenosis: Secondary | ICD-10-CM

## 2017-06-29 DIAGNOSIS — I2510 Atherosclerotic heart disease of native coronary artery without angina pectoris: Secondary | ICD-10-CM | POA: Diagnosis not present

## 2017-06-29 DIAGNOSIS — F3170 Bipolar disorder, currently in remission, most recent episode unspecified: Secondary | ICD-10-CM

## 2017-06-29 DIAGNOSIS — J45909 Unspecified asthma, uncomplicated: Secondary | ICD-10-CM | POA: Diagnosis not present

## 2017-06-29 DIAGNOSIS — I452 Bifascicular block: Secondary | ICD-10-CM

## 2017-06-29 DIAGNOSIS — G4733 Obstructive sleep apnea (adult) (pediatric): Secondary | ICD-10-CM | POA: Diagnosis not present

## 2017-06-29 NOTE — Patient Instructions (Signed)
Try any of the over-the-counter antihistamines like Claritin or Allegra, Zyrtec

## 2017-06-29 NOTE — Progress Notes (Signed)
Subjective:   HPI  Gregory [name] is a 66 y.o. male who presents for Chief Complaint  Patient presents with  . Medicare Wellness  . Annual Exam    Medical care team includes: [name], MD here for primary care  Dr.Kaur Dr.Lupton Dr.Mannam    Preventative care:  Last ophthalmology visit:2017 Last dental visit: 6/18 Last colonoscopy:11/01/08 Last prostate exam: -- Last EKG:10/19/14 Last labs:01/31/16  Prior vaccinations:  TD or Tdap:10/29/12 Influenza:10/09/16 Pneumococcal:23: 08/30/13 13: 01/31/16 Shingles/Zostavax: 10/29/12. He has 71 Shingrix vaccination and is due for another one soon. Advanced directive: Yes. Information ask for Concerns: He does have underlying allergies and is doing well on his present medication regimen mainly using Flonase. He does complain of slight nasal congestion especially in the morning. He also has asthma and is on fluticasone/albuterol and holding his own there as well. He has started smoking but recently did start using the patch again. He does have aortic stenosis and has been seen within the last year for that as well as his underlying coronary artery disease. He also has a history of RBBB .Continues on Lipitor for his hyperlipidemia and is having no trouble with that medication. He is also taking amlodipine/, metoprolol and lisinopril/HCTZ for his high blood pressure. Continues to be followed by Dr. [name] for his underlying bipolar disorder well his OCD. He does have sleep apnea but presently is not on CPAP. Apparently he is having difficulty with coverage from an insurance perspective and does plan to follow-up on that. He remains in remission on his alcohol and is now over 2 years alcohol free. Reviewed their medical, surgical, family, social, medication, and allergy history and updated chart as appropriate.  Past Medical History:  Diagnosis Date  . Alcohol abuse   . Allergy   . Aortic stenosis   . Asthma   . Bipolar disorder ([place])   .  CAD (coronary artery disease)    coronary calcifications 08/2013 CTA  . Carotid artery occlusion   . Chronic back pain   . Claudication ([place])   . Family history of skin cancer   . Heart murmur    slight  . Hyperlipidemia   . Hypertension   . Neuromuscular disorder ([place])   . Pneumonia    hx of  . RBBB    a. 7.2012 low risk nuclear exam, sm area of apical ischemia.  . Sleep apnea    does not wear CPAP  . Smoker   . Spinal stenosis at L4-L5 level   . Tobacco use disorder     Past Surgical History:  Procedure Laterality Date  . ANAL FISTULECTOMY  09/25/11  . COLONOSCOPY  2009  . ELBOW SURGERY     bilaterally for cubital tunnel  . MAXIMUM ACCESS (MAS)POSTERIOR LUMBAR INTERBODY FUSION (PLIF) 1 LEVEL N/A 10/25/2014   Procedure: LUMBAR FOUR TO FIVE MAXIMUM ACCESS (MAS) POSTERIOR LUMBAR INTERBODY FUSION (PLIF) 1 LEVEL;  Surgeon: Eustace Moore, MD;  Location: [facility];  Service: Neurosurgery;  Laterality: N/A;  . TONSILLECTOMY  age 15  . US ECHOCARDIOGRAPHY  06/20/2010   EF 55-60%  . VASECTOMY     X 2  . VASECTOMY REVERSAL      Social History   Social History  . Marital status: Married    Spouse name: N/A  . Number of children: N/A  . Years of education: N/A   Occupational History  . Not on file.   Social History Main Topics  . Smoking status: Current  Every Day Smoker    Packs/day: 1.00    Years: 15.00    Types: Cigarettes  . Smokeless tobacco: Never Used     Comment: Pt had stopped smoking X10 years/ currently smoking a 0.5 packs a day   . Alcohol use 46.8 oz/week    50 Glasses of wine, 28 Standard drinks or equivalent per week     Comment: 2 glasses of wine per night.  . Drug use: No  . Sexual activity: Yes   Other Topics Concern  . Not on file   Social History Narrative   Lives in [place] with wife.  Does not routinely exercise.  Sedentary r/t chronic lbp.    Family History  Problem Relation Age of Onset  . Stroke Father 25  . Cancer Father        skin  .  Drug abuse Father   . Hypertension Brother   . Diabetes Brother   . Drug abuse Brother   . Cancer Maternal Aunt        skin  . Cancer Maternal Grandmother        liver     Current Outpatient Prescriptions:  .  albuterol (PROVENTIL HFA;VENTOLIN HFA) 108 (90 Base) MCG/ACT inhaler, Inhale 2 puffs into the lungs every 6 (six) hours as needed for wheezing., Disp: 3 Inhaler, Rfl: 1 .  amLODipine (NORVASC) 10 MG tablet, TAKE 1 TABLET (10 MG TOTAL) BY MOUTH DAILY., Disp: 90 tablet, Rfl: 2 .  aspirin EC 81 MG tablet, Take 1 tablet (81 mg total) by mouth daily., Disp: 30 tablet, Rfl: 0 .  atorvastatin (LIPITOR) 20 MG tablet, TAKE 1 TABLET (20 MG TOTAL) BY MOUTH DAILY., Disp: 90 tablet, Rfl: 0 .  divalproex (DEPAKOTE) 500 MG DR tablet, Take 4 tablets (2,000 mg total) by mouth at bedtime. (Patient taking differently: Take 1,500 mg by mouth at bedtime. Pt take 3 tablets total 1,500 mg), Disp: 480 tablet, Rfl: 3 .  fluticasone (FLONASE) 50 MCG/ACT nasal spray, USE 2 SPRAYS IN EACH NOSTRIL ONCE DAILY, Disp: 16 g, Rfl: 11 .  Fluticasone-Salmeterol (ADVAIR DISKUS) 500-50 MCG/DOSE AEPB, Inhale 1 puff into the lungs every 12 (twelve) hours., Disp: 180 each, Rfl: 1 .  ibuprofen (ADVIL,MOTRIN) 100 MG/5ML suspension, Take 200 mg by mouth as needed. Reported on 06/25/2016, Disp: , Rfl:  .  lamoTRIgine (LAMICTAL) 200 MG tablet, Take 1 tablet (200 mg total) by mouth every morning., Disp: 90 tablet, Rfl: 3 .  lisinopril-hydrochlorothiazide (PRINZIDE,ZESTORETIC) 20-12.5 MG tablet, TAKE 1 TABLET BY MOUTH EVERY MORNING., Disp: 90 tablet, Rfl: 3 .  metoprolol tartrate (LOPRESSOR) 25 MG tablet, TAKE 0.5 TABLETS (12.5 MG TOTAL) BY MOUTH DAILY., Disp: 45 tablet, Rfl: 2 .  Multiple Vitamins-Minerals (MULTIVITAMIN ADULTS 50+ PO), Take 1 tablet by mouth., Disp: , Rfl:   Allergies  Allergen Reactions  . Codeine Anaphylaxis       Review of Systems Negative except as above   Objective:   General appearance: alert, no  distress, WD/WN, Caucasian male Skin: Normal HEENT: normocephalic, conjunctiva/corneas normal, sclerae anicteric, PERRLA, EOMi, nares patent, no discharge or erythema, pharynx normal Oral cavity: MMM, tongue normal, teeth normal Neck: supple, no lymphadenopathy, no thyromegaly, no masses, normal ROM, no bruits Chest: non tender, normal shape and expansion Heart: RRR, normal S1, S2, no murmurs Lungs: CTA bilaterally, no wheezes, rhonchi, or rales Abdomen: +bs, soft, non tender, non distended, no masses, no hepatomegaly, no splenomegaly, no bruits Back: non tender, normal ROM, no scoliosis Musculoskeletal: upper extremities non  tender, no obvious deformity, normal ROM throughout, lower extremities non tender, no obvious deformity, normal ROM throughout Extremities: no edema, no cyanosis, no clubbing Pulses: 2+ symmetric, upper and lower extremities, normal cap refill Neurological: alert, oriented x 3, CN2-12 intact, strength normal upper extremities and lower extremities, sensation normal throughout, DTRs 2+ throughout, no cerebellar signs, gait normal Psychiatric: normal affect, behavior normal, pleasant    Assessment and Plan :    Routine general medical examination at a health care facility  Allergic rhinitis due to pollen, unspecified seasonality  Aortic valve stenosis, etiology of cardiac valve disease unspecified - Plan: CBC with Differential/Platelet, Comprehensive metabolic panel, Lipid panel  Uncomplicated asthma, unspecified asthma severity, unspecified whether persistent  Bipolar affective disorder in remission ([place])  Coronary artery disease involving native coronary artery of native heart without angina pectoris - Plan: CBC with Differential/Platelet, Comprehensive metabolic panel, Lipid panel  Current smoker  Hyperlipidemia with target LDL less than 130 - Plan: Lipid panel  Essential hypertension - Plan: CBC with Differential/Platelet, Comprehensive metabolic  panel  OSA (obstructive sleep apnea)  Recovering alcoholic in remission ([place])  Obsessive-compulsive disorder, unspecified type  RBBB (right bundle branch block with left anterior fascicular block) He will continue on his present medication regimen for the above diagnoses they seem to be stable. Encouraged him to continue with his smoking cessation as well as alcohol abstinence. He is also to check with his DME provider concerning getting on a CPAP. He will continue to be followed by Dr. [name] Did recommend that he add an antihistamine to his allergy regimen since he is still having some nasal congestion.  Physical exam - discussed and counseled on healthy lifestyle, diet, exercise, preventative care, vaccinations, sick and well care, proper use of emergency dept and after hours care, and addressed their concerns.

## 2017-06-30 DIAGNOSIS — L821 Other seborrheic keratosis: Secondary | ICD-10-CM | POA: Diagnosis not present

## 2017-06-30 DIAGNOSIS — L218 Other seborrheic dermatitis: Secondary | ICD-10-CM | POA: Diagnosis not present

## 2017-06-30 DIAGNOSIS — L570 Actinic keratosis: Secondary | ICD-10-CM | POA: Diagnosis not present

## 2017-06-30 LAB — CBC WITH DIFFERENTIAL/PLATELET
Basophils Absolute: 57 cells/uL (ref 0–200)
Basophils Relative: 1 %
Eosinophils Absolute: 228 cells/uL (ref 15–500)
Eosinophils Relative: 4 %
HCT: 41.4 % (ref 38.5–50.0)
Hemoglobin: 14.2 g/dL (ref 13.2–17.1)
Lymphocytes Relative: 51 %
Lymphs Abs: 2907 cells/uL (ref 850–3900)
MCH: 31.1 pg (ref 27.0–33.0)
MCHC: 34.3 g/dL (ref 32.0–36.0)
MCV: 90.8 fL (ref 80.0–100.0)
MPV: 9.2 fL (ref 7.5–12.5)
Monocytes Absolute: 627 cells/uL (ref 200–950)
Monocytes Relative: 11 %
Neutro Abs: 1881 cells/uL (ref 1500–7800)
Neutrophils Relative %: 33 %
Platelets: 169 10*3/uL (ref 140–400)
RBC: 4.56 MIL/uL (ref 4.20–5.80)
RDW: 14 % (ref 11.0–15.0)
WBC: 5.7 10*3/uL (ref 4.0–10.5)

## 2017-06-30 LAB — COMPREHENSIVE METABOLIC PANEL
ALT: 26 U/L (ref 9–46)
AST: 22 U/L (ref 10–35)
Albumin: 4.2 g/dL (ref 3.6–5.1)
Alkaline Phosphatase: 49 U/L (ref 40–115)
BUN: 15 mg/dL (ref 7–25)
CO2: 25 mmol/L (ref 20–31)
Calcium: 9.5 mg/dL (ref 8.6–10.3)
Chloride: 103 mmol/L (ref 98–110)
Creat: 0.77 mg/dL (ref 0.70–1.25)
Glucose, Bld: 107 mg/dL — ABNORMAL HIGH (ref 65–99)
Potassium: 4.2 mmol/L (ref 3.5–5.3)
Sodium: 138 mmol/L (ref 135–146)
Total Bilirubin: 0.3 mg/dL (ref 0.2–1.2)
Total Protein: 7 g/dL (ref 6.1–8.1)

## 2017-06-30 LAB — LIPID PANEL
Cholesterol: 133 mg/dL (ref <200)
HDL: 26 mg/dL — ABNORMAL LOW (ref >40)
LDL Cholesterol: 62 mg/dL (ref <100)
Total CHOL/HDL Ratio: 5.1 Ratio — ABNORMAL HIGH (ref <5.0)
Triglycerides: 225 mg/dL — ABNORMAL HIGH (ref <150)
VLDL: 45 mg/dL — ABNORMAL HIGH (ref <30)

## 2017-07-10 ENCOUNTER — Encounter: Payer: Self-pay | Admitting: Pulmonary Disease

## 2017-07-10 ENCOUNTER — Ambulatory Visit (INDEPENDENT_AMBULATORY_CARE_PROVIDER_SITE_OTHER): Payer: PPO | Admitting: Pulmonary Disease

## 2017-07-10 VITALS — BP 136/60 | HR 52 | Ht 65.5 in | Wt 144.0 lb

## 2017-07-10 DIAGNOSIS — G4733 Obstructive sleep apnea (adult) (pediatric): Secondary | ICD-10-CM

## 2017-07-10 DIAGNOSIS — J4530 Mild persistent asthma, uncomplicated: Secondary | ICD-10-CM | POA: Diagnosis not present

## 2017-07-10 NOTE — Patient Instructions (Signed)
Continue using the Advair and albuterol Please follow-up with your equipment company about starting CPAP Return to clinic in 6 months.

## 2017-07-10 NOTE — Progress Notes (Signed)
Gregory [name]    [id number]    [birth year]  Primary Care Physician:Lalonde, Elyse Jarvis, MD  Referring Physician: [name], [address], [place] [address]  Chief complaint:  Follow up for  Moderate persistent asthma OSA  HPI: Gregory [name] is a 66 year oldHistory of childhood asthma. He was diagnosed at the age of 20. He is never seen a pulmonologist or had lung function tests. He is currently maintained on Advair 500/50. He is also on albuterol rescue inhaler. She uses it only several times a year during spring and fall allergy season. However he is using it now 3-4 times every day since she gets short of breath with public speaking. he has seasonal allergies, rhinitis, postnasal drip. Denies GERD symptoms.  He was diagnosed with sleep apnea 8 years ago. He used CPAP for a few years but then stopped because of discomfort with the mask. He has symptoms of daily snoring, daytime somnolence, fatigue.  Interim History: Complains of sinus congestion, nasal discharge which she attributes to the cold. He is using Flonase and antihistamine over-the-counter. He is occasional shortness of breath. He continues on Advair and albuterol when necessary.  Outpatient Encounter Prescriptions as of 07/10/2017  Medication Sig  . albuterol (PROVENTIL HFA;VENTOLIN HFA) 108 (90 Base) MCG/ACT inhaler Inhale 2 puffs into the lungs every 6 (six) hours as needed for wheezing.  [name] amLODipine (NORVASC) 10 MG tablet TAKE 1 TABLET (10 MG TOTAL) BY MOUTH DAILY.  [name] aspirin EC 81 MG tablet Take 1 tablet (81 mg total) by mouth daily.  [name] atorvastatin (LIPITOR) 20 MG tablet TAKE 1 TABLET (20 MG TOTAL) BY MOUTH DAILY.  . divalproex (DEPAKOTE) 500 MG DR tablet Take 4 tablets (2,000 mg total) by mouth at bedtime. (Patient taking differently: Take 1,500 mg by mouth at bedtime. Pt take 3 tablets total 1,500 mg)  . fluticasone (FLONASE) 50 MCG/ACT nasal spray USE 2 SPRAYS IN EACH NOSTRIL ONCE  DAILY  . Fluticasone-Salmeterol (ADVAIR DISKUS) 500-50 MCG/DOSE AEPB Inhale 1 puff into the lungs every 12 (twelve) hours.  [name] ibuprofen (ADVIL,MOTRIN) 100 MG/5ML suspension Take 200 mg by mouth as needed. Reported on 06/25/2016  . lamoTRIgine (LAMICTAL) 200 MG tablet Take 1 tablet (200 mg total) by mouth every morning.  [name] lisinopril-hydrochlorothiazide (PRINZIDE,ZESTORETIC) 20-12.5 MG tablet TAKE 1 TABLET BY MOUTH EVERY MORNING.  . metoprolol tartrate (LOPRESSOR) 25 MG tablet TAKE 0.5 TABLETS (12.5 MG TOTAL) BY MOUTH DAILY.  . Multiple Vitamins-Minerals (MULTIVITAMIN ADULTS 50+ PO) Take 1 tablet by mouth.   No facility-administered encounter medications on file as of 07/10/2017.     Allergies as of 07/10/2017 - Review Complete 07/10/2017  Allergen Reaction Noted  . Codeine Anaphylaxis 10/18/2008    Past Medical History:  Diagnosis Date  . Alcohol abuse   . Allergy   . Aortic stenosis   . Asthma   . Bipolar disorder ([place])   . CAD (coronary artery disease)    coronary calcifications 08/2013 CTA  . Carotid artery occlusion   . Chronic back pain   . Claudication ([place])   . Family history of skin cancer   . Heart murmur    slight  . Hyperlipidemia   . Hypertension   . Neuromuscular disorder ([place])   . Pneumonia    hx of  . RBBB    a. 7.2012 low risk nuclear exam, sm area of apical ischemia.  . Sleep apnea    does not wear CPAP  .  Smoker   . Spinal stenosis at L4-L5 level   . Tobacco use disorder     Past Surgical History:  Procedure Laterality Date  . ANAL FISTULECTOMY  09/25/11  . COLONOSCOPY  2009  . ELBOW SURGERY     bilaterally for cubital tunnel  . MAXIMUM ACCESS (MAS)POSTERIOR LUMBAR INTERBODY FUSION (PLIF) 1 LEVEL N/A 10/25/2014   Procedure: LUMBAR FOUR TO FIVE MAXIMUM ACCESS (MAS) POSTERIOR LUMBAR INTERBODY FUSION (PLIF) 1 LEVEL;  Surgeon: Eustace Moore, MD;  Location: [facility];  Service: Neurosurgery;  Laterality: N/A;  . TONSILLECTOMY  age 6  . US  ECHOCARDIOGRAPHY  06/20/2010   EF 55-60%  . VASECTOMY     X 2  . VASECTOMY REVERSAL      Family History  Problem Relation Age of Onset  . Stroke Father 52  . Cancer Father        skin  . Drug abuse Father   . Hypertension Brother   . Diabetes Brother   . Drug abuse Brother   . Cancer Maternal Aunt        skin  . Cancer Maternal Grandmother        liver    Social History   Social History  . Marital status: Married    Spouse name: N/A  . Number of children: N/A  . Years of education: N/A   Occupational History  . Not on file.   Social History Main Topics  . Smoking status: Current Every Day Smoker    Packs/day: 1.00    Years: 15.00    Types: Cigarettes  . Smokeless tobacco: Never Used     Comment: Pt had stopped smoking X10 years/ currently smoking a pack a day   . Alcohol use 46.8 oz/week    50 Glasses of wine, 28 Standard drinks or equivalent per week     Comment: 2 glasses of wine per night.  . Drug use: No  . Sexual activity: Yes   Other Topics Concern  . Not on file   Social History Narrative   Lives in [place] with wife.  Does not routinely exercise.  Sedentary r/t chronic lbp.    Review of systems: Review of Systems  Constitutional: Negative for fever and chills.  HENT: Negative.   Eyes: Negative for blurred vision.  Respiratory: as per HPI  Cardiovascular: Negative for chest pain and palpitations.  Gastrointestinal: Negative for vomiting, diarrhea, blood per rectum. Genitourinary: Negative for dysuria, urgency, frequency and hematuria.  Musculoskeletal: Negative for myalgias, back pain and joint pain.  Skin: Negative for itching and rash.  Neurological: Negative for dizziness, tremors, focal weakness, seizures and loss of consciousness.  Endo/Heme/Allergies: Negative for environmental allergies.  Psychiatric/Behavioral: Negative for depression, suicidal ideas and hallucinations.  All other systems reviewed and are negative.  Physical Exam: Blood  pressure 136/60, pulse (!) 52, height 5' 5.5" (1.664 m), weight 144 lb (65.3 kg), SpO2 95 %. Gen:      No acute distress HEENT:  EOMI, sclera anicteric Neck:     No masses; no thyromegaly Lungs:    Clear to auscultation bilaterally; normal respiratory effort CV:         Regular rate and rhythm; no murmurs Abd:      + bowel sounds; soft, non-tender; no palpable masses, no distension Ext:    No edema; adequate peripheral perfusion Skin:      Warm and dry; no rash Neuro: alert and oriented x 3 Psych: normal mood and affect  Data Reviewed: PFTs 02/29/16 FVC 2.18 (82%) FEV1 2.17 (76%) F/F 68 TLC 95% DLCO 74% Minimal obstructive airway disease, minimal reduction in diffusion capacity.  Sleep study 05/06/16 Moderate sleep apnea. AHI 24. Recommend CPAP at 17.  IgE 02/27/16 2258 CBC with diff 06/29/17- WBC 5.7, eos 4%, Absolute eos count 228  Assessment:   #1 Asthma. Elevated IgE Continues on Advair. Uses albuterol 1-2 times/week CBC shows mild eosinophilia. IgE levels are highly elevated at 2258. His imaging does not show any bronchiectasis so he would not need workup for ABPA. We'll continue the inhalers as prescribed.  #2 OSA Moderate sleep apnea on recent sleep study. Not on CPAP as he is worried about the cost. I have encouraged him to follow up with the DME and resume therapy  # 3 Smoking. Cessation discussed.he had attempted multiple times in the past but without any success. He is scheduled to attend a smoking cessation clinic.  Plan/Recommendations: - Continue Advair, albuterol - Resume CPAP - Smoking cessation.  Marshell Garfinkel MD  Pulmonary and Critical Care Pager [phone] 07/10/2017, 3:59 PM  CC: [name], MD

## 2017-07-14 ENCOUNTER — Other Ambulatory Visit: Payer: Self-pay | Admitting: Family Medicine

## 2017-08-02 ENCOUNTER — Other Ambulatory Visit: Payer: Self-pay | Admitting: Family Medicine

## 2017-08-13 ENCOUNTER — Encounter: Payer: Self-pay | Admitting: Family Medicine

## 2017-08-13 ENCOUNTER — Ambulatory Visit (INDEPENDENT_AMBULATORY_CARE_PROVIDER_SITE_OTHER): Payer: PPO | Admitting: Family Medicine

## 2017-08-13 VITALS — BP 128/66 | HR 51 | Temp 97.4°F | Wt 147.6 lb

## 2017-08-13 DIAGNOSIS — Z720 Tobacco use: Secondary | ICD-10-CM

## 2017-08-13 DIAGNOSIS — J0100 Acute maxillary sinusitis, unspecified: Secondary | ICD-10-CM

## 2017-08-13 DIAGNOSIS — F17200 Nicotine dependence, unspecified, uncomplicated: Secondary | ICD-10-CM | POA: Diagnosis not present

## 2017-08-13 DIAGNOSIS — Z716 Tobacco abuse counseling: Secondary | ICD-10-CM | POA: Diagnosis not present

## 2017-08-13 MED ORDER — AMOXICILLIN-POT CLAVULANATE 875-125 MG PO TABS: 1.0000 | ORAL_TABLET | Freq: Two times a day (BID) | ORAL | 0 refills | Status: DC

## 2017-08-13 NOTE — Progress Notes (Signed)
   Subjective:    Patient ID: Gregory [name], male    DOB: [birth year], 66 y.o.   MRN: [id number]  HPI He complains of a 4 week history of started with PND, sore throat, sinus pressure, earache and dry cough. The sinus pressure has now become more painful as well as continuing with his earache. No fever, chills, shortness of breath. He does continue to smoke and is down to half a pack per day but is also using nicotine gum and patches.   Review of Systems     Objective:   Physical Exam Alert and in no distress. Nasal mucosa is slightly pink with some tenderness especially over maxillary this might sinuses. Tympanic membranes and canals are normal. Pharyngeal area is normal. Neck is supple without adenopathy or thyromegaly. Cardiac exam shows a regular sinus rhythm without murmurs or gallops. Lungs are clear to auscultation.        Assessment & Plan:  Acute non-recurrent maxillary sinusitis - Plan: amoxicillin-clavulanate (AUGMENTIN) 875-125 MG tablet  Current smoker  Encounter for smoking cessation counseling He is to call when he finishes the Augmentin if not entirely back to normal. The remainder of the encounter was spent discussing smoking cessation. Encouraged him to only use a patch or gum but not both. Also discussed triggers that would make him want to smoke. Further discussion indicates that he is smoking now mainly the cause of triggers. He will keep in touch with me concerning making progress on this.

## 2017-11-02 ENCOUNTER — Other Ambulatory Visit: Payer: Self-pay | Admitting: Family Medicine

## 2017-11-02 ENCOUNTER — Ambulatory Visit: Payer: PPO | Admitting: Family Medicine

## 2017-11-02 ENCOUNTER — Encounter: Payer: Self-pay | Admitting: Family Medicine

## 2017-11-02 VITALS — BP 124/70 | HR 53 | Temp 97.9°F | Resp 16 | Wt 146.4 lb

## 2017-11-02 DIAGNOSIS — F17200 Nicotine dependence, unspecified, uncomplicated: Secondary | ICD-10-CM

## 2017-11-02 DIAGNOSIS — J019 Acute sinusitis, unspecified: Secondary | ICD-10-CM

## 2017-11-02 DIAGNOSIS — I10 Essential (primary) hypertension: Secondary | ICD-10-CM

## 2017-11-02 MED ORDER — AMOXICILLIN 875 MG PO TABS: 875.0000 mg | ORAL_TABLET | Freq: Two times a day (BID) | ORAL | 0 refills | Status: DC

## 2017-11-02 NOTE — Progress Notes (Signed)
Chief Complaint  Patient presents with  . sick    cough, in chest now, clear and yellow mucous, congestion, sore throat from drainage, ear pain on left side. sinus pressure,     Subjective:  Gregory [name] is a 66 y.o. male who presents for a month long history of cough, sinus pain and pressure and left ear pain. States his upper teeth are hurting. History of sinus infections and states this feels like previous sinusitis infection. States cough is improving with Mucinex.   Denies fever, chills, dizziness, chest pain, palpitations, shortness of breath, wheezing, abdominal pain, N/V/D, LE edema.   He is still smoking. No plans to stop.   Treatment to date: antihistamines, cough suppressants, decongestants and nasal steroids.  Denies sick contacts.  No other aggravating or relieving factors.  No other c/o.  ROS as in subjective.   Objective: Vitals:   11/02/17 1430  BP: 124/70  Pulse: (!) 53  Resp: 16  Temp: 97.9 F (36.6 C)  SpO2: 99%    General appearance: Alert, WD/WN, no distress, mildly ill appearing                             Skin: warm, no rash                           Head: +frontal and ethmoid sinus tenderness                            Eyes: conjunctiva normal, corneas clear, PERRLA                            Ears: pearly TMs, external ear canals normal                          Nose: septum midline, turbinates swollen, with erythema and no discharge             Mouth/throat: MMM, tongue normal, mild pharyngeal erythema                           Neck: supple, no adenopathy, no thyromegaly, nontender                          Heart: RRR, normal S1, S2, no murmurs                         Lungs: mild expiratory wheezes, no rales, or rhonchi      Assessment: Acute sinusitis with symptoms > 10 days - Plan: amoxicillin (AMOXIL) 875 MG tablet  Current smoker   Plan: Discussed diagnosis and treatment of acute sinusitis. Amoxil prescribed. Recommend he continue  treating underlying allergies and stop smoking.  Suggested symptomatic OTC remedies. Nasal saline spray for congestion.  Tylenol or Ibuprofen OTC for fever and malaise.  Call/return if not back to baseline after completing the antibiotic.  Also encouraged him to follow up with Dr. Redmond School if he had recurrent sinusitis in the next 3 months.

## 2018-01-03 ENCOUNTER — Other Ambulatory Visit: Payer: Self-pay | Admitting: Family Medicine

## 2018-01-05 ENCOUNTER — Other Ambulatory Visit: Payer: Self-pay | Admitting: Family Medicine

## 2018-04-01 ENCOUNTER — Other Ambulatory Visit: Payer: Self-pay | Admitting: Family Medicine

## 2018-04-15 ENCOUNTER — Other Ambulatory Visit: Payer: Self-pay | Admitting: Family Medicine

## 2018-04-26 ENCOUNTER — Other Ambulatory Visit: Payer: Self-pay | Admitting: Family Medicine

## 2018-04-26 DIAGNOSIS — I10 Essential (primary) hypertension: Secondary | ICD-10-CM

## 2018-05-05 DIAGNOSIS — F3174 Bipolar disorder, in full remission, most recent episode manic: Secondary | ICD-10-CM | POA: Diagnosis not present

## 2018-05-05 DIAGNOSIS — F3181 Bipolar II disorder: Secondary | ICD-10-CM | POA: Diagnosis not present

## 2018-05-19 ENCOUNTER — Other Ambulatory Visit: Payer: Self-pay | Admitting: Family Medicine

## 2018-06-01 ENCOUNTER — Other Ambulatory Visit: Payer: Self-pay | Admitting: Family Medicine

## 2018-06-06 ENCOUNTER — Other Ambulatory Visit: Payer: Self-pay | Admitting: Family Medicine

## 2018-06-10 ENCOUNTER — Encounter: Payer: Self-pay | Admitting: Family Medicine

## 2018-06-10 ENCOUNTER — Ambulatory Visit (INDEPENDENT_AMBULATORY_CARE_PROVIDER_SITE_OTHER): Payer: PPO | Admitting: Family Medicine

## 2018-06-10 VITALS — BP 140/82 | HR 53 | Temp 98.2°F | Wt 141.0 lb

## 2018-06-10 DIAGNOSIS — J019 Acute sinusitis, unspecified: Secondary | ICD-10-CM | POA: Diagnosis not present

## 2018-06-10 MED ORDER — LEVOFLOXACIN 500 MG PO TABS: 500.0000 mg | ORAL_TABLET | Freq: Every day | ORAL | 0 refills | Status: DC

## 2018-06-10 NOTE — Progress Notes (Signed)
  Subjective:     Patient ID: Gregory [name], male   DOB: [birth year], 67 y.o.   MRN: [id number]  HPI He presents with a 1 month history of congestion and coughing. Cough is productive with clear mucous. States the cough is worse at night and wakes him up 2 hours after he falls asleep. Also c/o upper back pain associated with coughing that started 3 days ago that is resolved with 800mg  ibuprofen q6 hours. Has tried OTC guaifenesin, benadryl and clariten without relief. States he started useing his albuterol inhaler 2-3x per day for the last week without relief. Has not been taking his rx advair for asthma.   On 6/7 he did a live MD session through his insurance online. Took RX augmentin which resolved symptoms for a few days and came back. States he drinks coffee "all day" and last cup is around 9pm or later. Drinks coffee since he quit drinking etoh 4 years ago. Smokes 1 pack cigarettes per day and wants to quit. Has patches and gum. Denies fever, shortness of breath, GERD, wheezing, n/v/d, or abdominal pain.  Review of Systems     Objective:   Physical Exam Alert and in no distress.  Nasal mucosa is slightly red with tenderness especially over frontal sinus.  Tympanic membranes and canals are normal. Pharyngeal area is normal. Neck is supple without adenopathy or thyromegaly. Cardiac exam shows a regular sinus rhythm without murmurs or gallops. Lungs are clear to auscultation.     Assessment:    Acute sinusitis with symptoms > 10 days - Plan: levofloxacin (LEVAQUIN) 500 MG tablet      Plan:    I will treat with Levaquin which in the past has worked.  He will call me if not entirely better when he finishes the antibiotic.    Patient was seen in conjunction with [facility]

## 2018-07-06 ENCOUNTER — Other Ambulatory Visit: Payer: Self-pay | Admitting: Family Medicine

## 2018-07-06 DIAGNOSIS — I10 Essential (primary) hypertension: Secondary | ICD-10-CM

## 2018-07-12 ENCOUNTER — Encounter: Payer: Self-pay | Admitting: Family Medicine

## 2018-07-12 ENCOUNTER — Ambulatory Visit (INDEPENDENT_AMBULATORY_CARE_PROVIDER_SITE_OTHER): Payer: PPO | Admitting: Family Medicine

## 2018-07-12 VITALS — BP 126/78 | HR 44 | Temp 97.9°F | Ht 64.5 in | Wt 139.4 lb

## 2018-07-12 DIAGNOSIS — E785 Hyperlipidemia, unspecified: Secondary | ICD-10-CM

## 2018-07-12 DIAGNOSIS — R3914 Feeling of incomplete bladder emptying: Secondary | ICD-10-CM

## 2018-07-12 DIAGNOSIS — F1021 Alcohol dependence, in remission: Secondary | ICD-10-CM

## 2018-07-12 DIAGNOSIS — Z125 Encounter for screening for malignant neoplasm of prostate: Secondary | ICD-10-CM

## 2018-07-12 DIAGNOSIS — I2510 Atherosclerotic heart disease of native coronary artery without angina pectoris: Secondary | ICD-10-CM

## 2018-07-12 DIAGNOSIS — J45990 Exercise induced bronchospasm: Secondary | ICD-10-CM

## 2018-07-12 DIAGNOSIS — J301 Allergic rhinitis due to pollen: Secondary | ICD-10-CM

## 2018-07-12 DIAGNOSIS — F17200 Nicotine dependence, unspecified, uncomplicated: Secondary | ICD-10-CM | POA: Diagnosis not present

## 2018-07-12 DIAGNOSIS — I350 Nonrheumatic aortic (valve) stenosis: Secondary | ICD-10-CM

## 2018-07-12 DIAGNOSIS — I452 Bifascicular block: Secondary | ICD-10-CM | POA: Diagnosis not present

## 2018-07-12 DIAGNOSIS — F3170 Bipolar disorder, currently in remission, most recent episode unspecified: Secondary | ICD-10-CM

## 2018-07-12 DIAGNOSIS — I10 Essential (primary) hypertension: Secondary | ICD-10-CM

## 2018-07-12 DIAGNOSIS — Z0000 Encounter for general adult medical examination without abnormal findings: Secondary | ICD-10-CM | POA: Diagnosis not present

## 2018-07-12 DIAGNOSIS — J45909 Unspecified asthma, uncomplicated: Secondary | ICD-10-CM

## 2018-07-12 DIAGNOSIS — N401 Enlarged prostate with lower urinary tract symptoms: Secondary | ICD-10-CM | POA: Diagnosis not present

## 2018-07-12 DIAGNOSIS — G4733 Obstructive sleep apnea (adult) (pediatric): Secondary | ICD-10-CM | POA: Diagnosis not present

## 2018-07-12 DIAGNOSIS — G6289 Other specified polyneuropathies: Secondary | ICD-10-CM

## 2018-07-12 DIAGNOSIS — Z136 Encounter for screening for cardiovascular disorders: Secondary | ICD-10-CM

## 2018-07-12 DIAGNOSIS — Z1211 Encounter for screening for malignant neoplasm of colon: Secondary | ICD-10-CM

## 2018-07-12 MED ORDER — LISINOPRIL-HYDROCHLOROTHIAZIDE 20-12.5 MG PO TABS: 1.0000 | ORAL_TABLET | Freq: Every morning | ORAL | 3 refills | Status: DC

## 2018-07-12 MED ORDER — ATORVASTATIN CALCIUM 20 MG PO TABS: 20.0000 mg | ORAL_TABLET | Freq: Every day | ORAL | 3 refills | Status: DC

## 2018-07-12 MED ORDER — METOPROLOL TARTRATE 25 MG PO TABS: 12.5000 mg | ORAL_TABLET | Freq: Every day | ORAL | 3 refills | Status: DC

## 2018-07-12 MED ORDER — AMLODIPINE BESYLATE 10 MG PO TABS: 10.0000 mg | ORAL_TABLET | Freq: Every day | ORAL | 3 refills | Status: DC

## 2018-07-12 MED ORDER — FINASTERIDE 5 MG PO TABS: 5.0000 mg | ORAL_TABLET | Freq: Every day | ORAL | 3 refills | Status: DC

## 2018-07-12 NOTE — Progress Notes (Signed)
Gregory [name] is a 67 y.o. male who presents for annual wellness visit and follow-up on chronic medical conditions.  He is here for his annual wellness visit.  He continues to be followed by Dr. [name] for his underlying psychiatric problems.  He seems to be doing well with this.  He remains alcohol free.  He continues to smoke and is at least making an attempt to quit smoking.  He does not use a long-acting asthma medication but does use albuterol on an as-needed basis and when he exercises.  He is having difficulty with nocturia as often as 4 times per night as well as decreased stream and feeling of incomplete emptying.  Allergies seem to be under good control.  He does have underlying OSA but is not using CPAP as he does not tolerate it.  He has been alcohol free for 3 years now.  Does have an underlying history of aortic stenosis and did have an echo done in 2017.  He has had no chest pain, shortness of breath, syncopal episodes.  Does have a slight neuropathy but it is not causing difficulty.  He is comfortable with that.  Continues on atorvastatin without difficulty.  Continues on his blood pressure medications and is having no complaints concerning that.   Immunizations and Health Maintenance Immunization History  Administered Date(s) Administered  . Influenza Split 09/28/2012, 10/19/2014, 09/17/2015  . Influenza Whole 10/08/2010  . Influenza-Unspecified 09/21/2013, 10/09/2016, 10/22/2017  . Pneumococcal Conjugate-13 01/31/2016  . Pneumococcal Polysaccharide-23 08/30/2013  . Tdap 10/29/2012  . Zoster Recombinat (Shingrix) 04/23/2017, 07/19/2017   There are no preventive care reminders to display for this patient.  Last colonoscopy:2009 Last PSA: now Dentist: None at the present time Ophtho:1 1/2 yrs Exercise: several times per week.    Other doctors caring for patient include: [name]  Advanced Directives: Yes.  Copy asked for    Depression screen:  See questionnaire below.      Depression screen [facility] 2/9 07/12/2018 06/29/2017 01/31/2016 01/31/2016  Decreased Interest 0 0 0 0  Down, Depressed, Hopeless 0 0 0 0  PHQ - 2 Score 0 0 0 0    Fall Screen: See Questionaire below.   Fall Risk  07/12/2018 06/29/2017 01/31/2016 01/31/2016  Falls in the past year? No No No No    ADL screen:  See questionnaire below.  Functional Status Survey: Is the patient deaf or have difficulty hearing?: No Does the patient have difficulty seeing, even when wearing glasses/contacts?: No Does the patient have difficulty concentrating, remembering, or making decisions?: No Does the patient have difficulty walking or climbing stairs?: No Does the patient have difficulty dressing or bathing?: No Does the patient have difficulty doing errands alone such as visiting a doctor's office or shopping?: No   Review of Systems  Constitutional: -, -unexpected weight change, -anorexia, -fatigue Allergy: -sneezing, -itching, -congestion Dermatology: denies changing moles, rash, lumps ENT: -runny nose, -ear pain, -sore throat,  Cardiology:  -chest pain, -palpitations, -orthopnea, Respiratory: -cough, -shortness of breath, -dyspnea on exertion, -wheezing,  Gastroenterology: -abdominal pain, -nausea, -vomiting, -diarrhea, -constipation, -dysphagia Hematology: -bleeding or bruising problems Musculoskeletal: -arthralgias, -myalgias, -joint swelling, -back pain, - Ophthalmology: -vision changes,  Urology: -dysuria, -difficulty urinating,  -urinary frequency, -urgency, incontinence Neurology: -, -numbness, , -memory loss, -falls, -dizziness    PHYSICAL EXAM:   General Appearance: Alert, cooperative, no distress, appears stated age Head: Normocephalic, without obvious abnormality, atraumatic Eyes: PERRL, conjunctiva/corneas clear, EOM's intact, fundi benign Ears: Normal TM's and external ear canals  Nose: Nares normal, mucosa normal, no drainage or sinus   tenderness Throat: Lips, mucosa, and tongue normal;  teeth and gums normal Neck: Supple, no lymphadenopathy, thyroid:no enlargement/tenderness/nodules; no carotid bruit or JVD Lungs: Clear to auscultation bilaterally without wheezes, rales or ronchi; respirations unlabored Heart: Regular rate and rhythm, S1 and S2 normal, 2/6 SEM Abdomen: Soft, non-tender, nondistended, normoactive bowel sounds, no masses, no hepatosplenomegaly Extremities: No clubbing, cyanosis or edema Pulses: 2+ and symmetric all extremities Skin: Skin color, texture, turgor normal, no rashes or lesions Lymph nodes: Cervical, supraclavicular, and axillary nodes normal Neurologic: CNII-XII intact, normal strength, sensation and gait; reflexes 2+ and symmetric throughout   Psych: Normal mood, affect, hygiene and grooming  ASSESSMENT/PLAN: Routine general medical examination at a health care facility - Plan: CBC with Differential/Platelet, Comprehensive metabolic panel, Lipid panel  Benign prostatic hyperplasia with incomplete bladder emptying - Plan: finasteride (PROSCAR) 5 MG tablet  RBBB (right bundle branch block with left anterior fascicular block)  Allergic rhinitis due to pollen, unspecified seasonality  Aortic valve stenosis, etiology of cardiac valve disease unspecified  Essential hypertension - Plan: CBC with Differential/Platelet, Comprehensive metabolic panel, amLODipine (NORVASC) 10 MG tablet, lisinopril-hydrochlorothiazide (PRINZIDE,ZESTORETIC) 20-12.5 MG tablet, metoprolol tartrate (LOPRESSOR) 25 MG tablet  Bipolar affective disorder in remission (HCC)  Uncomplicated asthma, unspecified asthma severity, unspecified whether persistent  Hyperlipidemia with target LDL less than 130 - Plan: Lipid panel, atorvastatin (LIPITOR) 20 MG tablet  Current smoker  Coronary artery disease involving native coronary artery of native heart without angina pectoris - Plan: CBC with Differential/Platelet, Comprehensive metabolic panel, Lipid panel  OSA (obstructive sleep  apnea)  Recovering alcoholic in remission (HCC)  Other polyneuropathy  Screening for AAA (abdominal aortic aneurysm) - Plan: US ABDOMINAL AORTA SCREENING AAA  Screening for colon cancer - Plan: Cologuard  Screening for prostate cancer - Plan: PSA  Exercise-induced asthma He will continue on his present medication regimen.  Do not feel the need to follow-up on the stenosis at the present time.  I will place him on finasteride.  Discussed possible issues with him concerning that.  He will follow-up with me in 2 months concerning that.     Medicare Attestation I have personally reviewed: The patient's medical and social history Their use of alcohol, tobacco or illicit drugs Their current medications and supplements The patient's functional ability including ADLs,fall risks, home safety risks, cognitive, and hearing and visual impairment Diet and physical activities Evidence for depression or mood disorders  The patient's weight, height, and BMI have been recorded in the chart.  I have made referrals, counseling, and provided education to the patient based on review of the above and I have provided the patient with a written personalized care plan for preventive services.     [name], MD   07/12/2018    Gregory [name] is a 67 y.o. male who presents for annual wellness visit and follow-up on chronic medical conditions.  He has the following concerns:   Immunizations and Health Maintenance Immunization History  Administered Date(s) Administered  . Influenza Split 09/28/2012, 10/19/2014, 09/17/2015  . Influenza Whole 10/08/2010  . Influenza-Unspecified 09/21/2013, 10/09/2016, 10/22/2017  . Pneumococcal Conjugate-13 01/31/2016  . Pneumococcal Polysaccharide-23 08/30/2013  . Tdap 10/29/2012  . Zoster Recombinat (Shingrix) 04/23/2017, 07/19/2017   There are no preventive care reminders to display for this patient.  Last colonoscopy: over ten years Last PSA: last  year Dentist: one year ago Ophtho: year and half Exercise: walking 30 min  Other doctors caring  for patient include:  Advanced Directives:    Depression screen:  See questionnaire below.     Depression screen [facility] 2/9 07/12/2018 06/29/2017 01/31/2016 01/31/2016  Decreased Interest 0 0 0 0  Down, Depressed, Hopeless 0 0 0 0  PHQ - 2 Score 0 0 0 0    Fall Screen: See Questionaire below.   Fall Risk  07/12/2018 06/29/2017 01/31/2016 01/31/2016  Falls in the past year? No No No No    ADL screen:  See questionnaire below.  Functional Status Survey: Is the patient deaf or have difficulty hearing?: No Does the patient have difficulty seeing, even when wearing glasses/contacts?: No Does the patient have difficulty concentrating, remembering, or making decisions?: No Does the patient have difficulty walking or climbing stairs?: No Does the patient have difficulty dressing or bathing?: No Does the patient have difficulty doing errands alone such as visiting a doctor's office or shopping?: No   Review of Systems  Constitutional: -, -unexpected weight change, -anorexia, -fatigue Allergy: -sneezing, -itching, -congestion Dermatology: denies changing moles, rash, lumps ENT: -runny nose, -ear pain, -sore throat,  Cardiology:  -chest pain, -palpitations, -orthopnea, Respiratory: -cough, -shortness of breath, -dyspnea on exertion, -wheezing,  Gastroenterology: -abdominal pain, -nausea, -vomiting, -diarrhea, -constipation, -dysphagia Hematology: -bleeding or bruising problems Musculoskeletal: -arthralgias, -myalgias, -joint swelling, -back pain, - Ophthalmology: -vision changes,  Urology: -dysuria, -difficulty urinating,  -urinary frequency, -urgency, incontinence Neurology: -, -numbness, , -memory loss, -falls, -dizziness    PHYSICAL EXAM:  There were no vitals taken for this visit.  General Appearance: Alert, cooperative, no distress, appears stated age Head: Normocephalic, without obvious  abnormality, atraumatic Eyes: PERRL, conjunctiva/corneas clear, EOM's intact, fundi benign Ears: Normal TM's and external ear canals Nose: Nares normal, mucosa normal, no drainage or sinus   tenderness Throat: Lips, mucosa, and tongue normal; teeth and gums normal Neck: Supple, no lymphadenopathy, thyroid:no enlargement/tenderness/nodules; no carotid bruit or JVD Lungs: Clear to auscultation bilaterally without wheezes, rales or ronchi; respirations unlabored Heart: Regular rate and rhythm, S1 and S2 normal, no murmur, rub or gallop Abdomen: Soft, non-tender, nondistended, normoactive bowel sounds, no masses, no hepatosplenomegaly Extremities: No clubbing, cyanosis or edema Pulses: 2+ and symmetric all extremities Skin: Skin color, texture, turgor normal, no rashes or lesions Lymph nodes: Cervical, supraclavicular, and axillary nodes normal Neurologic: CNII-XII intact, normal strength, sensation and gait; reflexes 2+ and symmetric throughout   Psych: Normal mood, affect, hygiene and grooming  ASSESSMENT/PLAN:    Discussed PSA screening (risks/benefits), recommended at least 30 minutes of aerobic activity at least 5 days/week; proper sunscreen use reviewed; healthy diet and alcohol recommendations (less than or equal to 2 drinks/day) reviewed; regular seatbelt use; changing batteries in smoke detectors. Immunization recommendations discussed.  Colonoscopy recommendations reviewed.   Medicare Attestation I have personally reviewed: The patient's medical and social history Their use of alcohol, tobacco or illicit drugs Their current medications and supplements The patient's functional ability including ADLs,fall risks, home safety risks, cognitive, and hearing and visual impairment Diet and physical activities Evidence for depression or mood disorders  The patient's weight, height, and BMI have been recorded in the chart.  I have made referrals, counseling, and provided education to the  patient based on review of the above and I have provided the patient with a written personalized care plan for preventive services.     [name], MD   07/12/2018

## 2018-07-13 ENCOUNTER — Telehealth: Payer: Self-pay

## 2018-07-13 LAB — COMPREHENSIVE METABOLIC PANEL
ALT: 22 IU/L (ref 0–44)
AST: 23 IU/L (ref 0–40)
Albumin/Globulin Ratio: 1.5 (ref 1.2–2.2)
Albumin: 4.6 g/dL (ref 3.6–4.8)
Alkaline Phosphatase: 63 IU/L (ref 39–117)
BUN/Creatinine Ratio: 15 (ref 10–24)
BUN: 15 mg/dL (ref 8–27)
Bilirubin Total: 0.5 mg/dL (ref 0.0–1.2)
CO2: 24 mmol/L (ref 20–29)
Calcium: 9.8 mg/dL (ref 8.6–10.2)
Chloride: 101 mmol/L (ref 96–106)
Creatinine, Ser: 1 mg/dL (ref 0.76–1.27)
GFR calc Af Amer: 89 mL/min/{1.73_m2} (ref >59)
GFR calc non Af Amer: 77 mL/min/{1.73_m2} (ref >59)
Globulin, Total: 3 g/dL (ref 1.5–4.5)
Glucose: 96 mg/dL (ref 65–99)
Potassium: 4.6 mmol/L (ref 3.5–5.2)
Sodium: 142 mmol/L (ref 134–144)
Total Protein: 7.6 g/dL (ref 6.0–8.5)

## 2018-07-13 LAB — CBC WITH DIFFERENTIAL/PLATELET
Basophils Absolute: 0 10*3/uL (ref 0.0–0.2)
Basos: 0 %
EOS (ABSOLUTE): 0.2 10*3/uL (ref 0.0–0.4)
Eos: 2 %
Hematocrit: 43.9 % (ref 37.5–51.0)
Hemoglobin: 15.2 g/dL (ref 13.0–17.7)
Immature Grans (Abs): 0 10*3/uL (ref 0.0–0.1)
Immature Granulocytes: 0 %
Lymphocytes Absolute: 3.1 10*3/uL (ref 0.7–3.1)
Lymphs: 45 %
MCH: 31.2 pg (ref 26.6–33.0)
MCHC: 34.6 g/dL (ref 31.5–35.7)
MCV: 90 fL (ref 79–97)
Monocytes Absolute: 0.5 10*3/uL (ref 0.1–0.9)
Monocytes: 8 %
Neutrophils Absolute: 3.1 10*3/uL (ref 1.4–7.0)
Neutrophils: 45 %
Platelets: 167 10*3/uL (ref 150–450)
RBC: 4.87 x10E6/uL (ref 4.14–5.80)
RDW: 15.1 % (ref 12.3–15.4)
WBC: 6.9 10*3/uL (ref 3.4–10.8)

## 2018-07-13 LAB — PSA: Prostate Specific Ag, Serum: 0.9 ng/mL (ref 0.0–4.0)

## 2018-07-13 LAB — LIPID PANEL
Chol/HDL Ratio: 4.7 ratio (ref 0.0–5.0)
Cholesterol, Total: 160 mg/dL (ref 100–199)
HDL: 34 mg/dL — ABNORMAL LOW (ref >39)
LDL Calculated: 93 mg/dL (ref 0–99)
Triglycerides: 164 mg/dL — ABNORMAL HIGH (ref 0–149)
VLDL Cholesterol Cal: 33 mg/dL (ref 5–40)

## 2018-07-13 NOTE — Telephone Encounter (Signed)
Called pt to advise that his [place] was scheduled for 07-27-18 at 8:10 am . Pt needs to be fasting and if he is unable to keep this appt he should call [phone]. [place]

## 2018-07-22 ENCOUNTER — Encounter: Payer: Self-pay | Admitting: Family Medicine

## 2018-07-26 ENCOUNTER — Encounter: Payer: Self-pay | Admitting: Family Medicine

## 2018-07-26 ENCOUNTER — Ambulatory Visit (INDEPENDENT_AMBULATORY_CARE_PROVIDER_SITE_OTHER): Payer: PPO | Admitting: Family Medicine

## 2018-07-26 VITALS — BP 110/68 | HR 47 | Temp 98.0°F | Resp 20 | Wt 141.0 lb

## 2018-07-26 DIAGNOSIS — J209 Acute bronchitis, unspecified: Secondary | ICD-10-CM

## 2018-07-26 DIAGNOSIS — L858 Other specified epidermal thickening: Secondary | ICD-10-CM

## 2018-07-26 DIAGNOSIS — J019 Acute sinusitis, unspecified: Secondary | ICD-10-CM

## 2018-07-26 MED ORDER — LIDOCAINE-EPINEPHRINE 2 %-1:100000 IJ SOLN
1.7000 mL | Freq: Once | INTRAMUSCULAR | Status: AC
  Administered 2018-07-26: 1.7 mL via INTRADERMAL

## 2018-07-26 MED ORDER — LEVOFLOXACIN 500 MG PO TABS: 500.0000 mg | ORAL_TABLET | Freq: Every day | ORAL | 0 refills | Status: DC

## 2018-07-26 MED ORDER — LIDOCAINE-EPINEPHRINE (PF) 1 %-1:200000 IJ SOLN: 10.0000 mL | Freq: Once | INTRAMUSCULAR | Status: DC

## 2018-07-26 NOTE — Progress Notes (Addendum)
   Subjective:    Patient ID: Gregory [name], male    DOB: [birth year], 67 y.o.   MRN: [id number]  HPI He is here for excision of cutaneous horn.  He also complains of a 2-week history started with cough with nasal and chest congestion, headache, maxillary sinus pressure and pain with PND slight left earache but no fever, chills or sore throat.  He was treated recently with Levaquin and states that he did get back to normal and this occurred several days after he finished the course.  He does not smoke.   Review of Systems     Objective:   Physical Exam Alert and in no distress.  Nasal mucosa is normal with tenderness at over maxillary sinuses.  Tympanic membranes and canals are normal. Pharyngeal area is normal. Neck is supple without adenopathy or thyromegaly. Cardiac exam shows a regular sinus rhythm without murmurs or gallops. Lungs show scattered rhonchi.        Assessment & Plan:  Acute bronchitis, unspecified organism - Plan: levofloxacin (LEVAQUIN) 500 MG tablet  Acute sinusitis with symptoms > 10 days - Plan: levofloxacin (LEVAQUIN) 500 MG tablet  Cutaneous horn He is to call me if not better when he finishes his antibiotic Lesion on the left mid forehead was injected with Xylocaine and epinephrine.  It was hyfrecated without difficulty.  It appeared benign .he is to return if this recurs.

## 2018-07-26 NOTE — Addendum Note (Signed)
Addended by: Elyse Jarvis on: 07/26/2018 01:59 PM   Modules accepted: Orders

## 2018-07-27 ENCOUNTER — Ambulatory Visit
Admission: RE | Admit: 2018-07-27 | Discharge: 2018-07-27 | Disposition: A | Discharge status: Home / Self Care | Payer: PPO | Source: Ambulatory Visit | Attending: Family Medicine | Admitting: Family Medicine

## 2018-07-27 DIAGNOSIS — Z87891 Personal history of nicotine dependence: Secondary | ICD-10-CM | POA: Diagnosis not present

## 2018-07-27 DIAGNOSIS — Z1211 Encounter for screening for malignant neoplasm of colon: Secondary | ICD-10-CM | POA: Diagnosis not present

## 2018-07-27 DIAGNOSIS — Z136 Encounter for screening for cardiovascular disorders: Secondary | ICD-10-CM | POA: Diagnosis not present

## 2018-07-27 LAB — COLOGUARD: Cologuard: NEGATIVE

## 2018-08-02 DIAGNOSIS — H52223 Regular astigmatism, bilateral: Secondary | ICD-10-CM | POA: Diagnosis not present

## 2018-08-02 DIAGNOSIS — H2513 Age-related nuclear cataract, bilateral: Secondary | ICD-10-CM | POA: Diagnosis not present

## 2018-08-02 DIAGNOSIS — H5213 Myopia, bilateral: Secondary | ICD-10-CM | POA: Diagnosis not present

## 2018-08-02 DIAGNOSIS — H40033 Anatomical narrow angle, bilateral: Secondary | ICD-10-CM | POA: Diagnosis not present

## 2018-08-02 DIAGNOSIS — H35372 Puckering of macula, left eye: Secondary | ICD-10-CM | POA: Diagnosis not present

## 2018-08-05 ENCOUNTER — Other Ambulatory Visit: Payer: Self-pay | Admitting: Family Medicine

## 2018-08-05 ENCOUNTER — Other Ambulatory Visit: Payer: Self-pay

## 2018-08-05 DIAGNOSIS — Z87891 Personal history of nicotine dependence: Secondary | ICD-10-CM

## 2018-08-05 DIAGNOSIS — F17200 Nicotine dependence, unspecified, uncomplicated: Secondary | ICD-10-CM

## 2018-08-12 ENCOUNTER — Other Ambulatory Visit (HOSPITAL_COMMUNITY): Payer: Self-pay

## 2018-09-30 ENCOUNTER — Telehealth: Payer: Self-pay | Admitting: Family Medicine

## 2018-09-30 NOTE — Telephone Encounter (Signed)
Pt was advised [facility] 

## 2018-09-30 NOTE — Telephone Encounter (Signed)
Pt wants to know if he need to get pneumonia vaccine  FYI  Also pt wanted it noted that he wants 90 day scripts on all his meds for now on since he gets a better rate through his insurance for 90 day scripts. [facility] told pt to let us know to send meds in to them as 90 day. Placed sticky note on acct as a reminder

## 2018-09-30 NOTE — Telephone Encounter (Signed)
He has had his pneumonia shots

## 2018-10-12 ENCOUNTER — Encounter: Payer: Self-pay | Admitting: Family Medicine

## 2018-10-12 ENCOUNTER — Other Ambulatory Visit: Payer: Self-pay | Admitting: Family Medicine

## 2018-10-12 ENCOUNTER — Ambulatory Visit (INDEPENDENT_AMBULATORY_CARE_PROVIDER_SITE_OTHER): Payer: PPO | Admitting: Family Medicine

## 2018-10-12 VITALS — BP 120/80 | HR 51 | Temp 97.8°F | Wt 139.8 lb

## 2018-10-12 DIAGNOSIS — Z23 Encounter for immunization: Secondary | ICD-10-CM | POA: Diagnosis not present

## 2018-10-12 DIAGNOSIS — L858 Other specified epidermal thickening: Secondary | ICD-10-CM | POA: Diagnosis not present

## 2018-10-12 DIAGNOSIS — L570 Actinic keratosis: Secondary | ICD-10-CM | POA: Diagnosis not present

## 2018-10-12 HISTORY — PX: SKIN BIOPSY: SHX1

## 2018-10-12 MED ORDER — LIDOCAINE-EPINEPHRINE (PF) 1 %-1:200000 IJ SOLN
10.0000 mL | Freq: Once | INTRAMUSCULAR | Status: AC
  Administered 2018-10-12: 10 mL via INTRADERMAL

## 2018-10-12 NOTE — Addendum Note (Signed)
Addended by: Elyse Jarvis on: 10/12/2018 12:19 PM   Modules accepted: Orders

## 2018-10-12 NOTE — Progress Notes (Signed)
   Subjective:    Patient ID: Gregory [name], male    DOB: 1951/11/21, 67 y.o.   MRN: [id number]  HPI He is here for excision of the cutaneous horn.  This has reoccurred.   Review of Systems     Objective:   Physical Exam A less than 0.5 cm lesion is noted on the left upper forehead area.       Assessment & Plan:  Need for influenza vaccination - Plan: Flu vaccine HIGH DOSE PF (Fluzone High dose)  Cutaneous horn The lesion was injected with Xylocaine and epinephrine and excised without difficulty the base was hyfrecated with silver nitrate.

## 2018-10-21 ENCOUNTER — Encounter: Payer: Self-pay | Admitting: Family Medicine

## 2018-11-04 ENCOUNTER — Other Ambulatory Visit: Payer: Self-pay

## 2018-11-04 MED ORDER — FLUTICASONE PROPIONATE 50 MCG/ACT NA SUSP: NASAL | 11 refills | Status: DC

## 2018-11-04 NOTE — Telephone Encounter (Signed)
[facility] sent fax requesting a 90 day supply for the pending medication.

## 2018-12-23 ENCOUNTER — Encounter: Payer: Self-pay | Admitting: Family Medicine

## 2018-12-23 ENCOUNTER — Ambulatory Visit (INDEPENDENT_AMBULATORY_CARE_PROVIDER_SITE_OTHER): Payer: PPO | Admitting: Family Medicine

## 2018-12-23 VITALS — BP 110/60 | HR 49 | Temp 98.1°F | Resp 16 | Wt 143.0 lb

## 2018-12-23 DIAGNOSIS — J0140 Acute pansinusitis, unspecified: Secondary | ICD-10-CM

## 2018-12-23 DIAGNOSIS — F17200 Nicotine dependence, unspecified, uncomplicated: Secondary | ICD-10-CM

## 2018-12-23 MED ORDER — AMOXICILLIN-POT CLAVULANATE 875-125 MG PO TABS: 1.0000 | ORAL_TABLET | Freq: Two times a day (BID) | ORAL | 0 refills | Status: DC

## 2018-12-23 NOTE — Patient Instructions (Signed)
Take the antibiotic as prescribed and stay well hydrated.   Take Mucinex or Delsym as needed.

## 2018-12-23 NOTE — Progress Notes (Signed)
   Subjective:    Patient ID: Gregory [name], male    DOB: [birth year], 68 y.o.   MRN: [id number]  HPI Chief Complaint  Patient presents with  . URI    URI- the day after christmas, cough, rib pain, sinus pressure, sore throat, hoarse, ear pain   He is here with complaints of a 8-9 day history of URI symptoms including rhinorrhea, nasal congestion, hoarseness, sinus pain, frontal headache, post nasal drainage and sore throat. States he knows he has a sinus infection. Dry cough for the past 3 days.  Denies fever, chills, chest pain, palpitations, shortness of breath, wheezing, abdominal pain, N/V.   Taking Guaifenesin.   Smoker. No recent antibiotic.   History of asthma.   Reviewed allergies, medications, past medical, surgical, family, and social history.    Review of Systems Pertinent positives and negatives in the history of present illness.     Objective:   Physical Exam Constitutional:      General: He is not in acute distress.    Appearance: Normal appearance.  HENT:     Right Ear: Tympanic membrane and ear canal normal.     Left Ear: Tympanic membrane and ear canal normal.     Nose: Congestion and rhinorrhea present.     Mouth/Throat:     Mouth: Mucous membranes are moist.     Pharynx: Oropharynx is clear. Posterior oropharyngeal erythema present. No oropharyngeal exudate.  Neck:     Musculoskeletal: Normal range of motion.  Cardiovascular:     Rate and Rhythm: Normal rate and regular rhythm.     Pulses: Normal pulses.  Pulmonary:     Effort: Pulmonary effort is normal.     Breath sounds: Examination of the left-upper field reveals wheezing. Wheezing present.  Lymphadenopathy:     Cervical: Cervical adenopathy present.  Skin:    General: Skin is warm and dry.  Neurological:     Mental Status: He is alert.    BP 110/60   Pulse (!) 49   Temp 98.1 F (36.7 C) (Oral)   Resp 16   Wt 143 lb (64.9 kg)   SpO2 98%   BMI 24.17 kg/m       Assessment &  Plan:  Acute non-recurrent pansinusitis - Plan: amoxicillin-clavulanate (AUGMENTIN) 875-125 MG tablet  Smoker  Discuss that he appears to have acute sinusitis.  No acute distress.  Augmentin prescribed.  Encouraged him to either take a probiotic or eat yogurt with live cultures while on the antibiotic. Also encouraged him to take Mucinex and stay well-hydrated.  He may use salt water gargles and Neti pot if needed. Smoking cessation discussed.  He is working on stopping smoking by using patches and gum. Discussed that I do not expect him to worsen and if he is then he will call or return.  He will also call or return if he is not back to baseline after completing antibiotics.

## 2018-12-26 ENCOUNTER — Encounter: Payer: Self-pay | Admitting: Gastroenterology

## 2019-01-06 ENCOUNTER — Telehealth: Payer: Self-pay | Admitting: Family Medicine

## 2019-01-06 DIAGNOSIS — J0140 Acute pansinusitis, unspecified: Secondary | ICD-10-CM

## 2019-01-06 MED ORDER — AMOXICILLIN-POT CLAVULANATE 875-125 MG PO TABS: 1.0000 | ORAL_TABLET | Freq: Two times a day (BID) | ORAL | 0 refills | Status: DC

## 2019-01-06 NOTE — Telephone Encounter (Signed)
Please find out how much better he is percentage wise. Ok to give him another round but if he is not back to baseline after completing the antibiotic then he will need to be seen again. Also, if he is not any better at all, I recommend he come in tomorrow and determine if he needs a different antibiotic or not.

## 2019-01-06 NOTE — Telephone Encounter (Signed)
Pt was 75-80% better a couple days before finishing antibiotic and now is back to when he first came in. 25% and having all the symptoms again. Will refill antibiotic for 10 more days and if he is not back to baseline then he will need to be seen again. Pt is aware

## 2019-01-06 NOTE — Telephone Encounter (Signed)
Pt called and states he is still not feeling good he finished his antibiotic yesterday, he is still having the facial pain and pressure and is dizzy f and his ears are still throbbing and is still having a sore throat, pt is wondering if he can get another round of antibiotics, pt uses   [facility] [address] [place] - [address] pt can be reached at [phone]

## 2019-02-23 ENCOUNTER — Encounter: Payer: Self-pay | Admitting: Medical

## 2019-02-23 ENCOUNTER — Ambulatory Visit (INDEPENDENT_AMBULATORY_CARE_PROVIDER_SITE_OTHER): Payer: PPO | Admitting: Medical

## 2019-02-23 VITALS — BP 130/80 | HR 49 | Temp 97.8°F | Resp 16 | Ht 65.0 in | Wt 142.6 lb

## 2019-02-23 DIAGNOSIS — F17200 Nicotine dependence, unspecified, uncomplicated: Secondary | ICD-10-CM | POA: Diagnosis not present

## 2019-02-23 DIAGNOSIS — J45909 Unspecified asthma, uncomplicated: Secondary | ICD-10-CM | POA: Diagnosis not present

## 2019-02-23 DIAGNOSIS — J0110 Acute frontal sinusitis, unspecified: Secondary | ICD-10-CM | POA: Diagnosis not present

## 2019-02-23 MED ORDER — DOXYCYCLINE HYCLATE 100 MG PO TABS: 100.0000 mg | ORAL_TABLET | Freq: Two times a day (BID) | ORAL | 0 refills | Status: DC

## 2019-02-23 NOTE — Progress Notes (Signed)
Subjective: Chief Complaint  Patient presents with  . sinus    ear pain, head pain, gum pain, drainage yellow X 1.5 week   Here for 1.5 week hx/o ear pain, sinus pressure, headache, sore throat, post nasal drip, red throat, ear pressure, yellow drainage, some cough, productive in morning.   Hydrating well.  Achy in general.  Wife has had same symptoms but hers lasted around 1.5 weeks and resolved.   Was seen by Dr. Redmond School a month or 2 ago, put on 2 rounds of amoxicillin for sinuses.  Got better, but now has new symptoms.  Using rescue inhaler some.  Still smokes.  Using albuterol some this week.  Hasn't used Advair in a while, but couldn't afford to c/t Advair.   No other aggravating or relieving factors. No other complaint.   Past Medical History:  Diagnosis Date  . Alcohol abuse   . Allergy   . Aortic stenosis   . Asthma   . Bipolar disorder ([place])   . CAD (coronary artery disease)    coronary calcifications 08/2013 CTA  . Carotid artery occlusion   . Chronic back pain   . Claudication ([place])   . Family history of skin cancer   . Heart murmur    slight  . Hyperlipidemia   . Hypertension   . Neuromuscular disorder ([place])   . Pneumonia    hx of  . RBBB    a. 7.2012 low risk nuclear exam, sm area of apical ischemia.  . Sleep apnea    does not wear CPAP  . Smoker   . Spinal stenosis at L4-L5 level   . Tobacco use disorder    Current Outpatient Medications on File Prior to Visit  Medication Sig Dispense Refill  . albuterol (PROVENTIL HFA;VENTOLIN HFA) 108 (90 Base) MCG/ACT inhaler Inhale 2 puffs into the lungs every 6 (six) hours as needed for wheezing. 3 Inhaler 1  . amLODipine (NORVASC) 10 MG tablet Take 1 tablet (10 mg total) by mouth daily. 90 tablet 3  . aspirin EC 81 MG tablet Take 1 tablet (81 mg total) by mouth daily. 30 tablet 0  . atorvastatin (LIPITOR) 20 MG tablet Take 1 tablet (20 mg total) by mouth daily. 90 tablet 3  . divalproex (DEPAKOTE) 500 MG DR tablet Take  4 tablets (2,000 mg total) by mouth at bedtime. (Patient taking differently: Take 1,000 mg by mouth 3 (three) times daily. Pt take 2 tablets total 1000 mg) 480 tablet 3  . finasteride (PROSCAR) 5 MG tablet Take 1 tablet (5 mg total) by mouth daily. 90 tablet 3  . fluticasone (FLONASE) 50 MCG/ACT nasal spray SPRAY TWO SPRAYS IN THE AFFECTED NOSTRIL DAILY 16 g 11  . Fluticasone-Salmeterol (ADVAIR DISKUS) 500-50 MCG/DOSE AEPB Inhale 1 puff into the lungs every 12 (twelve) hours. 180 each 1  . ibuprofen (ADVIL,MOTRIN) 100 MG/5ML suspension Take 200 mg by mouth as needed. Reported on 06/25/2016    . lamoTRIgine (LAMICTAL) 200 MG tablet Take 1 tablet (200 mg total) by mouth every morning. 90 tablet 3  . lisinopril-hydrochlorothiazide (PRINZIDE,ZESTORETIC) 20-12.5 MG tablet Take 1 tablet by mouth every morning. 90 tablet 3  . metoprolol tartrate (LOPRESSOR) 25 MG tablet Take 0.5 tablets (12.5 mg total) by mouth daily. 45 tablet 3  . Multiple Vitamins-Minerals (MULTIVITAMIN ADULTS 50+ PO) Take 1 tablet by mouth.     No current facility-administered medications on file prior to visit.    ROS as in subjective   Objective: BP 130/80  Pulse (!) 49   Temp 97.8 F (36.6 C) (Oral)   Resp 16   Ht 5\' 5"  (1.651 m)   Wt 142 lb 9.6 oz (64.7 kg)   SpO2 97%   BMI 23.73 kg/m   General appearance: alert, no distress, WD/WN,  HEENT: normocephalic, sclerae anicteric, TMs with serous fluid behind TMs, nares patent, no discharge or erythema, pharynx normal Oral cavity: MMM, no lesions Neck: supple, no lymphadenopathy, no thyromegaly, no masses Lungs: coarse breath sounds throughout, no wheezes, +rhonchi, no rales Pulses: 2+ symmetric, upper and lower extremities, normal cap refill Ext: no edema   Assessment: Encounter Diagnoses  Name Primary?  . Acute frontal sinusitis, recurrence not specified Yes  . Asthmatic bronchitis without complication, unspecified asthma severity, unspecified whether persistent    . Smoker      Plan: Discussed current symptoms and exam findings.  Begin doxycycline below, rest, hydrate well, use albuterol 2 puffs 3 4 6  hours as needed the next 4 to 5 days.  Gave sample of Breo to use 1 puff daily for the next 14 days  Advise he return soon with Dr. Redmond School his PCP to discuss chest CT lung cancer screening as well as updated PFT and chest x-ray.  Since Advair has been expensive in the past he is going to check insurance coverage for other preventative inhalers in the event we need to use regularly these going forward   Patient Instructions  Alternatives to Advair preventative inhaler is as follows:   Breo inhaler  Symbicort inhaler  Dulera inhaler  Anoro inhaler  Combivent  Another option billed differential through insurance is Pulmicort respules in nebulized treatment.    Consider talking to Dr. Redmond School about the following:  Chest CT lung cancer screen Updated chest xray and pulmonary function tests or PFT      Cylas was seen today for sinus.  Diagnoses and all orders for this visit:  Acute frontal sinusitis, recurrence not specified  Asthmatic bronchitis without complication, unspecified asthma severity, unspecified whether persistent  Smoker  Other orders -     doxycycline (VIBRA-TABS) 100 MG tablet; Take 1 tablet (100 mg total) by mouth 2 (two) times daily.

## 2019-02-23 NOTE — Patient Instructions (Addendum)
Alternatives to Advair preventative inhaler is as follows:   Breo inhaler  Symbicort inhaler  Dulera inhaler  Anoro inhaler  Combivent  Another option billed differential through insurance is Pulmicort respules in nebulized treatment.    Consider talking to Dr. Redmond School about the following:  Chest CT lung cancer screen Updated chest xray and pulmonary function tests or PFT

## 2019-03-07 ENCOUNTER — Telehealth: Payer: Self-pay | Admitting: Family Medicine

## 2019-03-07 MED ORDER — FLUTICASONE-SALMETEROL 500-50 MCG/DOSE IN AEPB: 1.0000 | INHALATION_SPRAY | Freq: Two times a day (BID) | RESPIRATORY_TRACT | 3 refills | Status: DC

## 2019-03-07 NOTE — Telephone Encounter (Signed)
done

## 2019-03-07 NOTE — Telephone Encounter (Signed)
   Needs refill on advair 500/50  Gregory [name]

## 2019-03-07 NOTE — Telephone Encounter (Signed)
Please advise KH 

## 2019-03-16 ENCOUNTER — Telehealth: Payer: Self-pay | Admitting: Family Medicine

## 2019-03-16 MED ORDER — ALBUTEROL SULFATE HFA 108 (90 BASE) MCG/ACT IN AERS: 2.0000 | INHALATION_SPRAY | Freq: Four times a day (QID) | RESPIRATORY_TRACT | 1 refills | Status: DC | PRN

## 2019-03-16 NOTE — Telephone Encounter (Signed)
Pt called  Requesting a refill on his albuterol inhaler for a 3 month supply pt would like it sent to the [facility] [address], [place] [address]

## 2019-03-16 NOTE — Telephone Encounter (Signed)
Explained to him that his inhaler should last much longer than 3 months and if it is not then we need to see him to reevaluate his need.

## 2019-03-17 NOTE — Telephone Encounter (Signed)
Pt was advised and he says it just out of date please call in albuterol. Barnard

## 2019-03-18 ENCOUNTER — Telehealth: Payer: Self-pay | Admitting: Family Medicine

## 2019-03-18 MED ORDER — ALBUTEROL SULFATE HFA 108 (90 BASE) MCG/ACT IN AERS: 2.0000 | INHALATION_SPRAY | Freq: Four times a day (QID) | RESPIRATORY_TRACT | 1 refills | Status: DC | PRN

## 2019-03-18 NOTE — Telephone Encounter (Signed)
recvd fax that albuterol not covered by insurance preferred is Proair, called [employer] and switched

## 2019-03-18 NOTE — Addendum Note (Signed)
Addended by: [name] on: 03/18/2019 02:37 PM   Modules accepted: Orders

## 2019-04-06 ENCOUNTER — Ambulatory Visit (INDEPENDENT_AMBULATORY_CARE_PROVIDER_SITE_OTHER): Payer: PPO | Admitting: Medical

## 2019-04-06 ENCOUNTER — Other Ambulatory Visit: Payer: Self-pay

## 2019-04-06 ENCOUNTER — Encounter: Payer: Self-pay | Admitting: Medical

## 2019-04-06 VITALS — Temp 97.8°F | Ht 65.0 in | Wt 145.0 lb

## 2019-04-06 DIAGNOSIS — J0110 Acute frontal sinusitis, unspecified: Secondary | ICD-10-CM

## 2019-04-06 MED ORDER — AMOXICILLIN 875 MG PO TABS: 875.0000 mg | ORAL_TABLET | Freq: Two times a day (BID) | ORAL | 0 refills | Status: DC

## 2019-04-06 NOTE — Addendum Note (Signed)
Addended by: Carlena Hurl on: 04/06/2019 12:19 PM   Modules accepted: Level of Service

## 2019-04-06 NOTE — Progress Notes (Signed)
Subjective:     Patient ID: Gregory [name], male   DOB: 15-Jun-1951, 68 y.o.   MRN: [id number]  This visit type was conducted due to national recommendations for restrictions regarding the COVID-19 Pandemic (e.g. social distancing) in an effort to limit this patient's exposure and mitigate transmission in our community.  Due to their co-morbid illnesses, this patient is at least at moderate risk for complications without adequate follow up.  This format is felt to be most appropriate for this patient at this time.    Documentation for virtual audio and video telecommunications through Zoom encounter:  The patient was located at home. The provider was located in the office. The patient did consent to this visit and is aware of possible charges through their insurance for this visit.  The other persons participating in this telemedicine service were none. Time spent on call was 15 minutes and in review of previous records >20 minutes total.  This virtual service is not related to other E/M service within previous 7 days.   HPI Chief Complaint  Patient presents with  . sinus    cough, sore throat X Saturday, ears full, headache, red eyes, pain in teeth X Saturday   Virtual visit today for possible sinus infection.   Sinus pain over eyes, sore throat, ear pressure, sore throat, mild redness in throat, feels like he is wearing a mask.   Has been doing some yard work, and does have allergy issues, but this has gone in to his sinuses.  Is using zyrtec already.   Feels some pressure in gums.   Has some cough, some production from sinuses of mucous.   No fever.  No body aches, no chills, no sweats.  No SOB, no wheezing.   Has acive inhaler.   Using mask if going to grocery store, but mostly has been at home.   No other aggravating or relieving factors. No other complaint.  Past Medical History:  Diagnosis Date  . Alcohol abuse   . Allergy   . Aortic stenosis   . Asthma   . Bipolar  disorder ([place])   . CAD (coronary artery disease)    coronary calcifications 08/2013 CTA  . Carotid artery occlusion   . Chronic back pain   . Claudication ([place])   . Family history of skin cancer   . Heart murmur    slight  . Hyperlipidemia   . Hypertension   . Neuromuscular disorder ([place])   . Pneumonia    hx of  . RBBB    a. 7.2012 low risk nuclear exam, sm area of apical ischemia.  . Sleep apnea    does not wear CPAP  . Smoker   . Spinal stenosis at L4-L5 level   . Tobacco use disorder      Review of Systems As in subjective    Objective:   Physical Exam  Temp 97.8 F (36.6 C) (Oral)   Ht 5\' 5"  (1.651 m)   Wt 145 lb (65.8 kg)   BMI 24.13 kg/m   Due to coronavirus pandemic stay at home measures, patient visit was virtual and they were not examined in person.   Gen: wd, wn, nad      Assessment:     Encounter Diagnosis  Name Primary?  . Acute non-recurrent frontal sinusitis Yes       Plan:     We discussed the limitations of virtual visit.  His symptoms suggest sinus infection, and he has had this pattern  in the past.  Begin amoxicillin, rest, hydrate well, continue guaifenesin that he is doing already.  Symptoms should gradually resolve over the next 5 to 6 days.  If worsening or new symptoms call back.  Follow-up PRN  Gregory [name] was seen today for sinus.  Diagnoses and all orders for this visit:  Acute non-recurrent frontal sinusitis  Other orders -     amoxicillin (AMOXIL) 875 MG tablet; Take 1 tablet (875 mg total) by mouth 2 (two) times daily.

## 2019-05-04 DIAGNOSIS — R69 Illness, unspecified: Secondary | ICD-10-CM | POA: Diagnosis not present

## 2019-05-27 ENCOUNTER — Ambulatory Visit (INDEPENDENT_AMBULATORY_CARE_PROVIDER_SITE_OTHER): Payer: PPO | Admitting: Medical

## 2019-05-27 ENCOUNTER — Other Ambulatory Visit: Payer: Self-pay

## 2019-05-27 VITALS — Ht 65.0 in | Wt 146.0 lb

## 2019-05-27 DIAGNOSIS — W57XXXA Bitten or stung by nonvenomous insect and other nonvenomous arthropods, initial encounter: Secondary | ICD-10-CM | POA: Diagnosis not present

## 2019-05-27 DIAGNOSIS — S40861A Insect bite (nonvenomous) of right upper arm, initial encounter: Secondary | ICD-10-CM | POA: Diagnosis not present

## 2019-05-27 DIAGNOSIS — L03113 Cellulitis of right upper limb: Secondary | ICD-10-CM

## 2019-05-27 MED ORDER — SULFAMETHOXAZOLE-TRIMETHOPRIM 800-160 MG PO TABS: 1.0000 | ORAL_TABLET | Freq: Two times a day (BID) | ORAL | 0 refills | Status: DC

## 2019-05-27 MED ORDER — SILVER SULFADIAZINE 1 % EX CREA: 1.0000 "application " | TOPICAL_CREAM | Freq: Every day | CUTANEOUS | 0 refills | Status: DC

## 2019-05-27 NOTE — Progress Notes (Signed)
Subjective:     Patient ID: Gregory [name], male   DOB: 05-27-1951, 68 y.o.   MRN: [id number]  This visit type was conducted due to national recommendations for restrictions regarding the COVID-19 Pandemic (e.g. social distancing) in an effort to limit this patient's exposure and mitigate transmission in our community.  Due to their co-morbid illnesses, this patient is at least at moderate risk for complications without adequate follow up.  This format is felt to be most appropriate for this patient at this time.    Documentation for virtual audio and video telecommunications through Zoom encounter:  The patient was located at home. The provider was located in the office. The patient did consent to this visit and is aware of possible charges through their insurance for this visit.  The other persons participating in this telemedicine service were none. Time spent on call was 15 minutes and in review of previous records >15 minutes total.  This virtual service is not related to other E/M service within previous 7 days.   HPI Chief Complaint  Patient presents with  . bug bites    bug bite X Tuesday red swelling, itch filled with puss    Virtual visit today for bug bite.  He notes insect bite on right posterior upper arm.  Noticed this Tuesday 4 days ago.  Been doing yard work and thinks a bug bit him while messing in the dirt.  Started with itching in the area, then recently it turned red and gotten worse redness, ongoing itching, and has seen some pus from the bite mark.  If leaning on the tablet gets discomfort.   No fever, no body aches, no chills.   Using some eczema cream OTC.  Has had cellulitis in past.    No other aggravating or relieving factors. No other complaint.  Past Medical History:  Diagnosis Date  . Alcohol abuse   . Allergy   . Aortic stenosis   . Asthma   . Bipolar disorder ([place])   . CAD (coronary artery disease)    coronary calcifications 08/2013 CTA  . Carotid  artery occlusion   . Chronic back pain   . Claudication ([place])   . Family history of skin cancer   . Heart murmur    slight  . Hyperlipidemia   . Hypertension   . Neuromuscular disorder ([place])   . Pneumonia    hx of  . RBBB    a. 7.2012 low risk nuclear exam, sm area of apical ischemia.  . Sleep apnea    does not wear CPAP  . Smoker   . Spinal stenosis at L4-L5 level   . Tobacco use disorder    Current Outpatient Medications on File Prior to Visit  Medication Sig Dispense Refill  . albuterol (PROVENTIL HFA;VENTOLIN HFA) 108 (90 Base) MCG/ACT inhaler Inhale 2 puffs into the lungs every 6 (six) hours as needed for wheezing. 3 Inhaler 1  . amLODipine (NORVASC) 10 MG tablet Take 1 tablet (10 mg total) by mouth daily. 90 tablet 3  . aspirin EC 81 MG tablet Take 1 tablet (81 mg total) by mouth daily. 30 tablet 0  . atorvastatin (LIPITOR) 20 MG tablet Take 1 tablet (20 mg total) by mouth daily. 90 tablet 3  . divalproex (DEPAKOTE) 500 MG DR tablet Take 4 tablets (2,000 mg total) by mouth at bedtime. (Patient taking differently: Take 1,000 mg by mouth 3 (three) times daily. Pt take 2 tablets total 1000 mg) 480 tablet 3  .  finasteride (PROSCAR) 5 MG tablet Take 1 tablet (5 mg total) by mouth daily. 90 tablet 3  . fluticasone (FLONASE) 50 MCG/ACT nasal spray SPRAY TWO SPRAYS IN THE AFFECTED NOSTRIL DAILY 16 g 11  . Fluticasone-Salmeterol (ADVAIR DISKUS) 500-50 MCG/DOSE AEPB Inhale 1 puff into the lungs every 12 (twelve) hours. 180 each 3  . ibuprofen (ADVIL,MOTRIN) 100 MG/5ML suspension Take 200 mg by mouth as needed. Reported on 06/25/2016    . lamoTRIgine (LAMICTAL) 200 MG tablet Take 1 tablet (200 mg total) by mouth every morning. 90 tablet 3  . lisinopril-hydrochlorothiazide (PRINZIDE,ZESTORETIC) 20-12.5 MG tablet Take 1 tablet by mouth every morning. 90 tablet 3  . metoprolol tartrate (LOPRESSOR) 25 MG tablet Take 0.5 tablets (12.5 mg total) by mouth daily. 45 tablet 3  . Multiple  Vitamins-Minerals (MULTIVITAMIN ADULTS 50+ PO) Take 1 tablet by mouth.     No current facility-administered medications on file prior to visit.       Review of Systems As in subjective    Objective:   Physical Exam Due to coronavirus pandemic stay at home measures, patient visit was virtual and they were not examined in person.   Skin: I reviewed pictures he sent in showing 2cm diameter erythematous lesions with somewhat of an opening and surrounding erythema.   He notes its warm to touch.    No other obvious deformity     Assessment:     Encounter Diagnoses  Name Primary?  . Cellulitis of right upper extremity Yes  . Insect bite of right upper arm, initial encounter        Plan:     Begin medications below, warm compressed, keep the arm clean with soap and water, use good hygiene and use daily silvadene cream and cover with bandage.    Discussed symptoms/signs of worsening infection or abscess that would prompt urgent eval.  If not much improved by Monday or if worse over weekend, get re-evaluated.  Td is up to date  Gregory [name] was seen today for bug bites.  Diagnoses and all orders for this visit:  Cellulitis of right upper extremity  Insect bite of right upper arm, initial encounter  Other orders -     sulfamethoxazole-trimethoprim (BACTRIM DS) 800-160 MG tablet; Take 1 tablet by mouth 2 (two) times daily. -     silver sulfADIAZINE (SILVADENE) 1 % cream; Apply 1 application topically daily.

## 2019-06-20 ENCOUNTER — Ambulatory Visit: Payer: PPO | Admitting: Family Medicine

## 2019-06-20 ENCOUNTER — Other Ambulatory Visit: Payer: Self-pay

## 2019-07-02 ENCOUNTER — Other Ambulatory Visit: Payer: Self-pay | Admitting: Family Medicine

## 2019-07-02 DIAGNOSIS — I10 Essential (primary) hypertension: Secondary | ICD-10-CM

## 2019-07-15 ENCOUNTER — Other Ambulatory Visit: Payer: Self-pay | Admitting: Family Medicine

## 2019-07-15 DIAGNOSIS — R3914 Feeling of incomplete bladder emptying: Secondary | ICD-10-CM

## 2019-07-15 DIAGNOSIS — N401 Enlarged prostate with lower urinary tract symptoms: Secondary | ICD-10-CM

## 2019-07-21 ENCOUNTER — Other Ambulatory Visit: Payer: Self-pay | Admitting: Family Medicine

## 2019-07-21 DIAGNOSIS — I10 Essential (primary) hypertension: Secondary | ICD-10-CM

## 2019-08-13 ENCOUNTER — Other Ambulatory Visit: Payer: Self-pay | Admitting: Family Medicine

## 2019-08-13 DIAGNOSIS — E785 Hyperlipidemia, unspecified: Secondary | ICD-10-CM

## 2019-08-30 ENCOUNTER — Encounter: Payer: Self-pay | Admitting: Medical

## 2019-08-30 ENCOUNTER — Ambulatory Visit (INDEPENDENT_AMBULATORY_CARE_PROVIDER_SITE_OTHER): Payer: PPO | Admitting: Medical

## 2019-08-30 ENCOUNTER — Other Ambulatory Visit: Payer: Self-pay

## 2019-08-30 VITALS — Temp 97.2°F | Ht 65.5 in | Wt 146.0 lb

## 2019-08-30 DIAGNOSIS — W57XXXA Bitten or stung by nonvenomous insect and other nonvenomous arthropods, initial encounter: Secondary | ICD-10-CM

## 2019-08-30 DIAGNOSIS — S70369A Insect bite (nonvenomous), unspecified thigh, initial encounter: Secondary | ICD-10-CM

## 2019-08-30 DIAGNOSIS — L089 Local infection of the skin and subcutaneous tissue, unspecified: Secondary | ICD-10-CM

## 2019-08-30 MED ORDER — DOXYCYCLINE HYCLATE 100 MG PO TABS: 100.0000 mg | ORAL_TABLET | Freq: Two times a day (BID) | ORAL | 0 refills | Status: DC

## 2019-08-30 MED ORDER — TRIAMCINOLONE ACETONIDE 0.1 % EX CREA: 1.0000 "application " | TOPICAL_CREAM | Freq: Two times a day (BID) | CUTANEOUS | 0 refills | Status: DC

## 2019-08-30 NOTE — Progress Notes (Signed)
  Subjective:     Patient ID: Gregory [name], male   DOB: [birth year], 69 y.o.   MRN: [id number]  This visit type was conducted due to national recommendations for restrictions regarding the COVID-19 Pandemic (e.g. social distancing) in an effort to limit this patient's exposure and mitigate transmission in our community.  Due to their co-morbid illnesses, this patient is at least at moderate risk for complications without adequate follow up.  This format is felt to be most appropriate for this patient at this time.    Documentation for virtual audio and video telecommunications through Zoom encounter:  The patient was located at home. The provider was located in the office. The patient did consent to this visit and is aware of possible charges through their insurance for this visit.  The other persons participating in this telemedicine service were none. Time spent on call was 15 minutes and in review of previous records >15 minutes total.  This virtual service is not related to other E/M service within previous 7 days.   HPI Chief Complaint  Patient presents with  . Insect Bite    red bites inside of thigh-right    Virtual consult for red bites on inside of red thigh.  Was pulling weeds yesterday.  Was wearing jeans, switched to shorts later for a barbeque last night.  yesterday noticed some red bumps.   This morning noticed whelps in a circle on inside of right thigh.  Has new thermometer, running 97.2 temp.  Does feel some aches from behind right knee up through thigh.   He denies itching but is feeling tenderness in the area of the rash.  Wonders if got bitten by insect.   Sees what may be 2 bumps like fang marks.  No going pain, no testicular swelling.  No nausea or vomiting.  No other aggravating or relieving factors. No other complaint.  Review of Systems As in subjective    Objective:   Physical Exam  Temp (!) 97.2 F (36.2 C) (Oral)   Ht 5' 5.5" (1.664 m)   Wt 146 lb  (66.2 kg)   BMI 23.93 kg/m   Due to coronavirus pandemic stay at home measures, patient visit was virtual and they were not examined in person.   Skin: left distal medial thigh with what appears to be a circular pattern of whealed pink/red lesions, each 3-61mm diameter, but the whole patch of lesions in a circle approx 5 cm diameter      Assessment:     Encounter Diagnoses  Name Primary?  . Skin infection Yes  . Insect bite of thigh, unspecified laterality, initial encounter        Plan:     We discussed the differential.  Given his history of recent reading on given to the parents, most likely insect bites but cannot rule out underlying other rash, autoimmune disease, fungus.  Begin medications below, continue good hygiene, and if not resolved within 5 to 7 days then recheck  Gregory [name] was seen today for insect bite.  Diagnoses and all orders for this visit:  Skin infection  Insect bite of thigh, unspecified laterality, initial encounter  Other orders -     doxycycline (VIBRA-TABS) 100 MG tablet; Take 1 tablet (100 mg total) by mouth 2 (two) times daily. -     triamcinolone cream (KENALOG) 0.1 %; Apply 1 application topically 2 (two) times daily.

## 2019-09-02 ENCOUNTER — Telehealth: Payer: Self-pay | Admitting: Medical

## 2019-09-02 NOTE — Telephone Encounter (Signed)
What medications does he need refilled due to cat?    If ZERO improvement, I'm going to change to different antibiotic and potential add something else.  So he is saying ZERO improvement?  Is it worse, if so, 25% worse, 50% worse??

## 2019-09-02 NOTE — Telephone Encounter (Signed)
The whole bite area is raised now. It's not better or worse. He needs a refill on doxycyline. Please send or make a change.

## 2019-09-02 NOTE — Telephone Encounter (Signed)
Pt called concerning the bite he had a virtual appt with Audelia Acton. He states he is taking the antibiotic but is not any better. Spot is NOT black or brown but still causing pain. He states his temp is 98.6 but he normally runs lower than that. Pt wants advise and wondering if her should come in.  ALSO pt had medications on the counter and his cat batted 5 off into the sink so he is missing that many. Pt uses [facility] [address] and can be reached at  [phone].

## 2019-09-05 ENCOUNTER — Ambulatory Visit (INDEPENDENT_AMBULATORY_CARE_PROVIDER_SITE_OTHER): Payer: PPO | Admitting: Medical

## 2019-09-05 ENCOUNTER — Encounter: Payer: Self-pay | Admitting: Medical

## 2019-09-05 ENCOUNTER — Other Ambulatory Visit: Payer: Self-pay

## 2019-09-05 VITALS — BP 140/70 | HR 64 | Temp 98.2°F | Ht 65.0 in | Wt 143.6 lb

## 2019-09-05 DIAGNOSIS — R21 Rash and other nonspecific skin eruption: Secondary | ICD-10-CM | POA: Diagnosis not present

## 2019-09-05 DIAGNOSIS — B029 Zoster without complications: Secondary | ICD-10-CM | POA: Diagnosis not present

## 2019-09-05 HISTORY — DX: Zoster without complications: B02.9

## 2019-09-05 NOTE — Patient Instructions (Signed)
Shingles  Shingles is an infection. It gives you a painful skin rash and blisters that have fluid in them. Shingles is caused by the same germ (virus) that causes chickenpox. Shingles only happens in people who:  Have had chickenpox.  Have been given a shot of medicine (vaccine) to protect against chickenpox. Shingles is rare in this group. The first symptoms of shingles may be itching, tingling, or pain in an area on your skin. A rash will show on your skin a few days or weeks later. The rash is likely to be on one side of your body. The rash usually has a shape like a belt or a band. Over time, the rash turns into fluid-filled blisters. The blisters will break open, change into scabs, and dry up. Medicines may:  Help with pain and itching.  Help you get better sooner.  Help to prevent long-term problems. Follow these instructions at home: Medicines  Take over-the-counter and prescription medicines only as told by your doctor.  Put on an anti-itch cream or numbing cream where you have a rash, blisters, or scabs. Do this as told by your doctor. Helping with itching and discomfort   Put cold, wet cloths (cold compresses) on the area of the rash or blisters as told by your doctor.  Cool baths can help you feel better. Try adding baking soda or dry oatmeal to the water to lessen itching. Do not bathe in hot water. Blister and rash care  Keep your rash covered with a loose bandage (dressing).  Wear loose clothing that does not rub on your rash.  Keep your rash and blisters clean. To do this, wash the area with mild soap and cool water as told by your doctor.  Check your rash every day for signs of infection. Check for: ? More redness, swelling, or pain. ? Fluid or blood. ? Warmth. ? Pus or a bad smell.  Do not scratch your rash. Do not pick at your blisters. To help you to not scratch: ? Keep your fingernails clean and cut short. ? Wear gloves or mittens when you sleep, if  scratching is a problem. General instructions  Rest as told by your doctor.  Keep all follow-up visits as told by your doctor. This is important.  Wash your hands often with soap and water. If soap and water are not available, use hand sanitizer. Doing this lowers your chance of getting a skin infection caused by germs (bacteria).  Your infection can cause chickenpox in people who have never had chickenpox or never got a shot of chickenpox vaccine. If you have blisters that did not change into scabs yet, try not to touch other people or be around other people, especially: ? Babies. ? Pregnant women. ? Children who have areas of red, itchy, or rough skin (eczema). ? Very old people who have transplants. ? People who have a long-term (chronic) sickness, like cancer or AIDS. Contact a doctor if:  Your pain does not get better with medicine.  Your pain does not get better after the rash heals.  You have any signs of infection in the rash area. These signs include: ? More redness, swelling, or pain around the rash. ? Fluid or blood coming from the rash. ? The rash area feeling warm to the touch. ? Pus or a bad smell coming from the rash. Get help right away if:  The rash is on your face or nose.  You have pain in your face or pain by   your eye.  You lose feeling on one side of your face.  You have trouble seeing.  You have ear pain, or you have ringing in your ear.  You have a loss of taste.  Your condition gets worse. Summary  Shingles gives you a painful skin rash and blisters that have fluid in them.  Shingles is an infection. It is caused by the same germ (virus) that causes chickenpox.  Keep your rash covered with a loose bandage (dressing). Wear loose clothing that does not rub on your rash.  If you have blisters that did not change into scabs yet, try not to touch other people or be around people. This information is not intended to replace advice given to you by  your health care provider. Make sure you discuss any questions you have with your health care provider. Document Released: 05/26/2008 Document Revised: 04/01/2019 Document Reviewed: 08/12/2017 Elsevier Patient Education  2020 [employer] Inc.  

## 2019-09-05 NOTE — Progress Notes (Signed)
Subjective: Chief Complaint  Patient presents with  . Follow-up    spider bite-worsening    Here for rash, f/u from visit last week.  Last week we did a virtual encounter for possible bug bite rash and infection of the skin.  He has been taking doxycycline but has no relief and no change in the rash.  He has new rash on his lower leg that was not there last week.  He denies fever, no body aches or chills, no nausea or vomiting.  He initially thought he may have been bit by spider. No other aggravating or relieving factors. No other complaint.   Past Medical History:  Diagnosis Date  . Alcohol abuse   . Allergy   . Aortic stenosis   . Asthma   . Bipolar disorder ([place])   . CAD (coronary artery disease)    coronary calcifications 08/2013 CTA  . Carotid artery occlusion   . Chronic back pain   . Claudication ([place])   . Family history of skin cancer   . Heart murmur    slight  . Hyperlipidemia   . Hypertension   . Neuromuscular disorder ([place])   . Pneumonia    hx of  . RBBB    a. 7.2012 low risk nuclear exam, sm area of apical ischemia.  . Sleep apnea    does not wear CPAP  . Smoker   . Spinal stenosis at L4-L5 level   . Tobacco use disorder    Current Outpatient Medications on File Prior to Visit  Medication Sig Dispense Refill  . albuterol (PROVENTIL HFA;VENTOLIN HFA) 108 (90 Base) MCG/ACT inhaler Inhale 2 puffs into the lungs every 6 (six) hours as needed for wheezing. 3 Inhaler 1  . amLODipine (NORVASC) 10 MG tablet Take 1 tablet (10 mg total) by mouth daily. 90 tablet 3  . aspirin EC 81 MG tablet Take 1 tablet (81 mg total) by mouth daily. 30 tablet 0  . atorvastatin (LIPITOR) 20 MG tablet TAKE ONE TABLET BY MOUTH DAILY 90 tablet 2  . divalproex (DEPAKOTE) 500 MG DR tablet Take 4 tablets (2,000 mg total) by mouth at bedtime. (Patient taking differently: Take 1,000 mg by mouth 3 (three) times daily. Pt take 2 tablets total 1000 mg) 480 tablet 3  . doxycycline (VIBRA-TABS) 100  MG tablet Take 1 tablet (100 mg total) by mouth 2 (two) times daily. 20 tablet 0  . finasteride (PROSCAR) 5 MG tablet TAKE ONE TABLET BY MOUTH DAILY 90 tablet 2  . fluticasone (FLONASE) 50 MCG/ACT nasal spray SPRAY TWO SPRAYS IN THE AFFECTED NOSTRIL DAILY 16 g 11  . Fluticasone-Salmeterol (ADVAIR DISKUS) 500-50 MCG/DOSE AEPB Inhale 1 puff into the lungs every 12 (twelve) hours. 180 each 3  . ibuprofen (ADVIL,MOTRIN) 100 MG/5ML suspension Take 200 mg by mouth as needed. Reported on 06/25/2016    . lamoTRIgine (LAMICTAL) 200 MG tablet Take 1 tablet (200 mg total) by mouth every morning. 90 tablet 3  . lisinopril-hydrochlorothiazide (ZESTORETIC) 20-12.5 MG tablet TAKE ONE TABLET BY MOUTH EVERY MORNING 90 tablet 2  . metoprolol tartrate (LOPRESSOR) 25 MG tablet TAKE ONE-HALF TABLET BY MOUTH DAILY 45 tablet 2  . Multiple Vitamins-Minerals (MULTIVITAMIN ADULTS 50+ PO) Take 1 tablet by mouth.    . silver sulfADIAZINE (SILVADENE) 1 % cream Apply 1 application topically daily. 50 g 0  . triamcinolone cream (KENALOG) 0.1 % Apply 1 application topically 2 (two) times daily. 30 g 0   No current facility-administered medications on file  prior to visit.    ROS as in subjective   Objective: BP 140/70   Pulse 64   Temp 98.2 F (36.8 C)   Ht 5\' 5"  (1.651 m)   Wt 143 lb 9.6 oz (65.1 kg)   SpO2 95%   BMI 23.90 kg/m   Gen: wd, wn, nad Skin: Right medial distal leg just proximal to the knee and below the knee also the right medial leg, just distal to the knee with 2 separate patches of erythematous lesions that are all 2 to 3 mm diameter.  They appear to be healing vesicles and some are starting to crust.  The distal patch was not there last week upon exam.  Exam last week by video conference was not very clear Nontender legs, no swelling, no deformity     Assessment: Encounter Diagnoses  Name Primary?  . Herpes zoster without complication Yes  . Rash      Plan: We discussed the findings that  suggest shingles infection.  Last week we did a virtual consult the video conference did not have enough clarity to see the rash as well as we would have liked.  He did not want to come into the office last week due to [place] concerns or exposures.  Now that I have seen the rash impression today it appears that he has had shingles outbreak and the lesions are starting to crust and heal.  Advise he stop doxycycline antibiotic.  He already has triamcinolone cream at home he can use that call.last week.  He can take Benadryl for itching and inflammation.  Discussed the symptoms, exam findings, suggestive of shingles outbreak.  discussed transmission, precautions to prevent spread to others, particularly high risk groups as discussed including young children, elderly, immunocompromised people, or pregnant women.  Discussed treatment including relative rest, hydration.   discussed the possibility of post herpetic neuralgia.   answered their questions, After visit summary given.   We will make her report with the VERS vaccine reporting system as he had shingles infection although he was vaccinated with Shingrix in 2018 at both doses  Timo[place]y was seen today for follow-up.  Diagnoses and all orders for this visit:  Herpes zoster without complication -     Varicella Zoster Abs, IgG/IgM  Rash -     Varicella Zoster Abs, IgG/IgM

## 2019-09-06 LAB — VARICELLA ZOSTER ABS, IGG/IGM
Varicella IgM: 1.71 index — ABNORMAL HIGH (ref 0.00–0.90)
Varicella zoster IgG: >4000 index (ref >165)

## 2019-09-26 ENCOUNTER — Other Ambulatory Visit: Payer: Self-pay | Admitting: *Deleted

## 2019-09-26 ENCOUNTER — Encounter: Payer: PPO | Admitting: Family Medicine

## 2019-09-26 ENCOUNTER — Other Ambulatory Visit: Payer: Self-pay

## 2019-09-26 DIAGNOSIS — Z20828 Contact with and (suspected) exposure to other viral communicable diseases: Secondary | ICD-10-CM | POA: Diagnosis not present

## 2019-09-26 DIAGNOSIS — Z20822 Contact with and (suspected) exposure to covid-19: Secondary | ICD-10-CM

## 2019-09-26 NOTE — Progress Notes (Deleted)
Gregory [name] is a 68 y.o. male who presents for annual wellness visit and follow-up on chronic medical conditions.  He has the following concerns:   Immunizations and Health Maintenance Immunization History  Administered Date(s) Administered  . Influenza Split 09/28/2012, 10/19/2014, 09/17/2015  . Influenza Whole 10/08/2010  . Influenza, High Dose Seasonal PF 10/12/2018  . Influenza-Unspecified 09/21/2013, 10/09/2016, 10/22/2017  . Pneumococcal Conjugate-13 01/31/2016  . Pneumococcal Polysaccharide-23 08/30/2013  . Tdap 10/29/2012  . Zoster Recombinat (Shingrix) 04/23/2017, 07/19/2017   Health Maintenance Due  Topic Date Due  . COLONOSCOPY  11/01/2018  . INFLUENZA VACCINE  07/23/2019    Last colonoscopy: Last PSA: Dentist: Ophtho: Exercise:  Other doctors caring for patient include:  Advanced Directives:    Depression screen:  See questionnaire below.     Depression screen [facility] 2/9 07/12/2018 06/29/2017 01/31/2016 01/31/2016  Decreased Interest 0 0 0 0  Down, Depressed, Hopeless 0 0 0 0  PHQ - 2 Score 0 0 0 0    Fall Screen: See Questionaire below.   Fall Risk  07/12/2018 06/29/2017 01/31/2016 01/31/2016  Falls in the past year? No No No No    ADL screen:  See questionnaire below.  Functional Status Survey:     Review of Systems  Constitutional: -, -unexpected weight change, -anorexia, -fatigue Allergy: -sneezing, -itching, -congestion Dermatology: denies changing moles, rash, lumps ENT: -runny nose, -ear pain, -sore throat,  Cardiology:  -chest pain, -palpitations, -orthopnea, Respiratory: -cough, -shortness of breath, -dyspnea on exertion, -wheezing,  Gastroenterology: -abdominal pain, -nausea, -vomiting, -diarrhea, -constipation, -dysphagia Hematology: -bleeding or bruising problems Musculoskeletal: -arthralgias, -myalgias, -joint swelling, -back pain, - Ophthalmology: -vision changes,  Urology: -dysuria, -difficulty urinating,  -urinary frequency, -urgency,  incontinence Neurology: -, -numbness, , -memory loss, -falls, -dizziness    PHYSICAL EXAM:  There were no vitals taken for this visit.  General Appearance: Alert, cooperative, no distress, appears stated age Head: Normocephalic, without obvious abnormality, atraumatic Eyes: PERRL, conjunctiva/corneas clear, EOM's intact, fundi benign Ears: Normal TM's and external ear canals Nose: Nares normal, mucosa normal, no drainage or sinus   tenderness Throat: Lips, mucosa, and tongue normal; teeth and gums normal Neck: Supple, no lymphadenopathy, thyroid:no enlargement/tenderness/nodules; no carotid bruit or JVD Lungs: Clear to auscultation bilaterally without wheezes, rales or ronchi; respirations unlabored Heart: Regular rate and rhythm, S1 and S2 normal, no murmur, rub or gallop Abdomen: Soft, non-tender, nondistended, normoactive bowel sounds, no masses, no hepatosplenomegaly Extremities: No clubbing, cyanosis or edema Pulses: 2+ and symmetric all extremities Skin: Skin color, texture, turgor normal, no rashes or lesions Lymph nodes: Cervical, supraclavicular, and axillary nodes normal Neurologic: CNII-XII intact, normal strength, sensation and gait; reflexes 2+ and symmetric throughout   Psych: Normal mood, affect, hygiene and grooming  ASSESSMENT/PLAN:    Discussed PSA screening (risks/benefits), recommended at least 30 minutes of aerobic activity at least 5 days/week; proper sunscreen use reviewed; healthy diet and alcohol recommendations (less than or equal to 2 drinks/day) reviewed; regular seatbelt use; changing batteries in smoke detectors. Immunization recommendations discussed.  Colonoscopy recommendations reviewed.   Medicare Attestation I have personally reviewed: The patient's medical and social history Their use of alcohol, tobacco or illicit drugs Their current medications and supplements The patient's functional ability including ADLs,fall risks, home safety risks,  cognitive, and hearing and visual impairment Diet and physical activities Evidence for depression or mood disorders  The patient's weight, height, and BMI have been recorded in the chart.  I have made referrals, counseling, and provided education to the  patient based on review of the above and I have provided the patient with a written personalized care plan for preventive services.     [name], MD   09/26/2019

## 2019-09-28 LAB — NOVEL CORONAVIRUS, NAA: SARS-CoV-2, NAA: NOT DETECTED

## 2019-09-29 ENCOUNTER — Other Ambulatory Visit: Payer: Self-pay

## 2019-09-29 ENCOUNTER — Encounter: Payer: Self-pay | Admitting: Family Medicine

## 2019-09-29 ENCOUNTER — Ambulatory Visit (INDEPENDENT_AMBULATORY_CARE_PROVIDER_SITE_OTHER): Payer: PPO | Admitting: Family Medicine

## 2019-09-29 VITALS — BP 128/78 | HR 49 | Temp 97.7°F | Ht 65.0 in | Wt 145.0 lb

## 2019-09-29 DIAGNOSIS — Z0000 Encounter for general adult medical examination without abnormal findings: Secondary | ICD-10-CM

## 2019-09-29 DIAGNOSIS — I452 Bifascicular block: Secondary | ICD-10-CM | POA: Diagnosis not present

## 2019-09-29 DIAGNOSIS — I350 Nonrheumatic aortic (valve) stenosis: Secondary | ICD-10-CM | POA: Diagnosis not present

## 2019-09-29 DIAGNOSIS — F3170 Bipolar disorder, currently in remission, most recent episode unspecified: Secondary | ICD-10-CM | POA: Diagnosis not present

## 2019-09-29 DIAGNOSIS — I2510 Atherosclerotic heart disease of native coronary artery without angina pectoris: Secondary | ICD-10-CM | POA: Diagnosis not present

## 2019-09-29 DIAGNOSIS — J4530 Mild persistent asthma, uncomplicated: Secondary | ICD-10-CM

## 2019-09-29 DIAGNOSIS — F429 Obsessive-compulsive disorder, unspecified: Secondary | ICD-10-CM

## 2019-09-29 DIAGNOSIS — G4733 Obstructive sleep apnea (adult) (pediatric): Secondary | ICD-10-CM | POA: Diagnosis not present

## 2019-09-29 DIAGNOSIS — E785 Hyperlipidemia, unspecified: Secondary | ICD-10-CM | POA: Diagnosis not present

## 2019-09-29 DIAGNOSIS — I10 Essential (primary) hypertension: Secondary | ICD-10-CM | POA: Diagnosis not present

## 2019-09-29 DIAGNOSIS — F1021 Alcohol dependence, in remission: Secondary | ICD-10-CM

## 2019-09-29 DIAGNOSIS — F17200 Nicotine dependence, unspecified, uncomplicated: Secondary | ICD-10-CM | POA: Diagnosis not present

## 2019-09-29 DIAGNOSIS — J301 Allergic rhinitis due to pollen: Secondary | ICD-10-CM | POA: Diagnosis not present

## 2019-09-29 DIAGNOSIS — J45909 Unspecified asthma, uncomplicated: Secondary | ICD-10-CM

## 2019-09-29 NOTE — Progress Notes (Signed)
Gregory [name] is a 68 y.o. male who presents for annual wellness visit and follow-up on chronic medical conditions.  He has remained alcohol free for 5 years and goes to [employer] regularly.  He does have a history of OSA but is not on CPAP.  He did not tolerate it.  He apparently had shingles in early September although for discussion with him indicates there is a question as to whether he had due to his pain pattern.  He does have bipolar disorder and follows up regularly with psychiatry.  He does have underlying CAD as well as left ear.  He has had no chest pain, shortness of breath, syncopal episodes.  He does have BPH and is on medication for that.  He has had Cologuard.  Social and family history was reviewed   Immunizations and Health Maintenance Immunization History  Administered Date(s) Administered  . Influenza Split 09/28/2012, 10/19/2014, 09/17/2015  . Influenza Whole 10/08/2010  . Influenza, High Dose Seasonal PF 10/22/2017, 10/12/2018, 08/24/2019  . Influenza-Unspecified 09/21/2013, 10/09/2016, 10/22/2017  . Pneumococcal Conjugate-13 01/31/2016  . Pneumococcal Polysaccharide-23 08/30/2013  . Tdap 10/29/2012  . Zoster Recombinat (Shingrix) 04/23/2017, 07/19/2017   Health Maintenance Due  Topic Date Due  . COLONOSCOPY  11/01/2018  . INFLUENZA VACCINE  07/23/2019    Last colonoscopy: 11-01-2008 Last PSA: 07/12/18 Dentist: One year ago Ophtho: Dr. Philmore Pali little over one year Exercise: not much due to back pain  Other doctors caring for patient include: Dr. Windell Hummingbird   Advanced Directives: Note on file information given    Depression screen:  See questionnaire below.     Depression screen [facility] 2/9 07/12/2018 06/29/2017 01/31/2016 01/31/2016  Decreased Interest 0 0 0 0  Down, Depressed, Hopeless 0 0 0 0  PHQ - 2 Score 0 0 0 0    Fall Screen: See Questionaire below.   Fall Risk  07/12/2018 06/29/2017 01/31/2016 01/31/2016  Falls in the past year? No No No No    ADL screen:   See questionnaire below.  Functional Status Survey:     Review of Systems  Constitutional: -, -unexpected weight change, -anorexia, -fatigue Allergy: -sneezing, -itching, -congestion Dermatology: denies changing moles, rash, lumps ENT: -runny nose, -ear pain, -sore throat,  Cardiology:  -chest pain, -palpitations, -orthopnea, Respiratory: -cough, -shortness of breath, -dyspnea on exertion, -wheezing,  Gastroenterology: -abdominal pain, -nausea, -vomiting, -diarrhea, -constipation, -dysphagia Hematology: -bleeding or bruising problems Musculoskeletal: -arthralgias, -myalgias, -joint swelling, -back pain, - Ophthalmology: -vision changes,  Urology: -dysuria, -difficulty urinating,  -urinary frequency, -urgency, incontinence Neurology: -, -numbness, , -memory loss, -falls, -dizziness    PHYSICAL EXAM:    General Appearance: Alert, cooperative, no distress, appears stated age Head: Normocephalic, without obvious abnormality, atraumatic Eyes: PERRL, conjunctiva/corneas clear, EOM's intact,  ears: Normal TM's and external ear canals Nose: Nares normal, mucosa normal, no drainage or sinus   tenderness Throat: Lips, mucosa, and tongue normal; teeth and gums normal Neck: Supple, no lymphadenopathy, thyroid:no enlargement/tenderness/nodules; no carotid bruit or JVD Lungs: Clear to auscultation bilaterally without wheezes, rales or ronchi; respirations unlabored Heart: Regular rate and rhythm, S1 and S2 normal, no murmur, rub or gallop Abdomen: Soft, non-tender, nondistended, normoactive bowel sounds, no masses, no hepatosplenomegaly Extremities: No clubbing, cyanosis or edema Pulses: 2+ and symmetric all extremities Skin: Skin color, texture, turgor normal, no rashes or lesions Lymph nodes: Cervical, supraclavicular, and axillary nodes normal Neurologic: CNII-XII intact, normal strength, sensation and gait; reflexes 2+ and symmetric throughout   Psych: Normal mood, affect,  hygiene and  grooming  ASSESSMENT/PLAN: Routine general medical examination at a health care facility - Plan: CBC with Differential/Platelet, Comprehensive metabolic panel, Lipid panel  Smoker: He is not interested in quitting smoking  Allergic rhinitis due to pollen, unspecified seasonality: Treat as needed  Bipolar affective disorder in remission [facility]): Continue with psychiatry Hyperlipidemia with target LDL less than 130 - Plan: Lipid panel  Essential hypertension - Plan: CBC with Differential/Platelet, Comprehensive metabolic panel  Aortic valve stenosis, etiology of cardiac valve disease unspecified - Plan: ECHOCARDIOGRAM COMPLETE  Coronary artery disease involving native coronary artery of native heart without angina pectoris  OSA (obstructive sleep apnea)  Obsessive-compulsive disorder, unspecified type  RBBB (right bundle branch block with left anterior fascicular block)  Recovering alcoholic in remission ([place]) Continue in AA. Also discussed the symptoms he is having from presumed shingles and at this time no further intervention needed.  If he continues have difficulty may consider using the Neurontin.     Immunization recommendations discussed.  Colonoscopy recommendations reviewed.   Medicare Attestation I have personally reviewed: The patient's medical and social history Their use of alcohol, tobacco or illicit drugs Their current medications and supplements The patient's functional ability including ADLs,fall risks, home safety risks, cognitive, and hearing and visual impairment Diet and physical activities Evidence for depression or mood disorders  The patient's weight, height, and BMI have been recorded in the chart.  I have made referrals, counseling, and provided education to the patient based on review of the above and I have provided the patient with a written personalized care plan for preventive services.     [name], MD   09/29/2019

## 2019-09-30 ENCOUNTER — Other Ambulatory Visit: Payer: Self-pay | Admitting: Family Medicine

## 2019-09-30 ENCOUNTER — Encounter (HOSPITAL_COMMUNITY): Payer: Self-pay | Admitting: Family Medicine

## 2019-09-30 DIAGNOSIS — I10 Essential (primary) hypertension: Secondary | ICD-10-CM

## 2019-09-30 LAB — COMPREHENSIVE METABOLIC PANEL
ALT: 30 IU/L (ref 0–44)
AST: 27 IU/L (ref 0–40)
Albumin/Globulin Ratio: 1.6 (ref 1.2–2.2)
Albumin: 4.7 g/dL (ref 3.8–4.8)
Alkaline Phosphatase: 67 IU/L (ref 39–117)
BUN/Creatinine Ratio: 19 (ref 10–24)
BUN: 15 mg/dL (ref 8–27)
Bilirubin Total: 0.5 mg/dL (ref 0.0–1.2)
CO2: 25 mmol/L (ref 20–29)
Calcium: 9.5 mg/dL (ref 8.6–10.2)
Chloride: 97 mmol/L (ref 96–106)
Creatinine, Ser: 0.8 mg/dL (ref 0.76–1.27)
GFR calc Af Amer: 105 mL/min/{1.73_m2} (ref >59)
GFR calc non Af Amer: 91 mL/min/{1.73_m2} (ref >59)
Globulin, Total: 3 g/dL (ref 1.5–4.5)
Glucose: 87 mg/dL (ref 65–99)
Potassium: 4.2 mmol/L (ref 3.5–5.2)
Sodium: 137 mmol/L (ref 134–144)
Total Protein: 7.7 g/dL (ref 6.0–8.5)

## 2019-09-30 LAB — LIPID PANEL
Chol/HDL Ratio: 3.8 ratio (ref 0.0–5.0)
Cholesterol, Total: 138 mg/dL (ref 100–199)
HDL: 36 mg/dL — ABNORMAL LOW (ref >39)
LDL Chol Calc (NIH): 77 mg/dL (ref 0–99)
Triglycerides: 144 mg/dL (ref 0–149)
VLDL Cholesterol Cal: 25 mg/dL (ref 5–40)

## 2019-09-30 LAB — CBC WITH DIFFERENTIAL/PLATELET
Basophils Absolute: 0.1 10*3/uL (ref 0.0–0.2)
Basos: 1 %
EOS (ABSOLUTE): 0.2 10*3/uL (ref 0.0–0.4)
Eos: 2 %
Hematocrit: 43.6 % (ref 37.5–51.0)
Hemoglobin: 15.3 g/dL (ref 13.0–17.7)
Immature Grans (Abs): 0 10*3/uL (ref 0.0–0.1)
Immature Granulocytes: 0 %
Lymphocytes Absolute: 3.7 10*3/uL — ABNORMAL HIGH (ref 0.7–3.1)
Lymphs: 47 %
MCH: 31.1 pg (ref 26.6–33.0)
MCHC: 35.1 g/dL (ref 31.5–35.7)
MCV: 89 fL (ref 79–97)
Monocytes Absolute: 0.7 10*3/uL (ref 0.1–0.9)
Monocytes: 9 %
Neutrophils Absolute: 3.3 10*3/uL (ref 1.4–7.0)
Neutrophils: 41 %
Platelets: 195 10*3/uL (ref 150–450)
RBC: 4.92 x10E6/uL (ref 4.14–5.80)
RDW: 13.5 % (ref 11.6–15.4)
WBC: 7.9 10*3/uL (ref 3.4–10.8)

## 2019-10-06 ENCOUNTER — Other Ambulatory Visit: Payer: Self-pay

## 2019-10-06 ENCOUNTER — Ambulatory Visit (HOSPITAL_COMMUNITY): Payer: PPO | Attending: Cardiovascular Disease

## 2019-10-06 DIAGNOSIS — I350 Nonrheumatic aortic (valve) stenosis: Secondary | ICD-10-CM

## 2019-10-07 ENCOUNTER — Ambulatory Visit (INDEPENDENT_AMBULATORY_CARE_PROVIDER_SITE_OTHER): Payer: PPO | Admitting: Family Medicine

## 2019-10-07 ENCOUNTER — Telehealth: Payer: Self-pay | Admitting: Cardiology

## 2019-10-07 ENCOUNTER — Encounter: Payer: Self-pay | Admitting: Family Medicine

## 2019-10-07 VITALS — BP 140/76 | Temp 96.2°F | Wt 145.0 lb

## 2019-10-07 DIAGNOSIS — I350 Nonrheumatic aortic (valve) stenosis: Secondary | ICD-10-CM | POA: Diagnosis not present

## 2019-10-07 DIAGNOSIS — M79604 Pain in right leg: Secondary | ICD-10-CM | POA: Diagnosis not present

## 2019-10-07 MED ORDER — NORTRIPTYLINE HCL 10 MG PO CAPS: 10.0000 mg | ORAL_CAPSULE | Freq: Every day | ORAL | 0 refills | Status: DC

## 2019-10-07 NOTE — Telephone Encounter (Signed)
LVM for patient to call and schedule new patient appointment with Dr. [name].  Patient saw Dr. [name] in 2015 at [facility], make sure he knows he is now at [facility].

## 2019-10-07 NOTE — Progress Notes (Signed)
   Subjective:    Patient ID: Gregory [name], male    DOB: May 21, 1951, 68 y.o.   MRN: [id number]  HPI Documentation for virtual telephone encounter. Documentation for virtual audio and video telecommunications through doximity encounter: The patient was located at home. The provider was located in the office. The patient did consent to this visit and is aware of possible charges through their insurance for this visit. The other persons participating in this telemedicine service were none. This virtual service is not related to other E/M service within previous 7 days. He recently had a echocardiogram which did show severe aortic stenosis with a high gradient.  Presently he is having no chest pain, shortness of breath, syncopal episodes. He also continues to complain of right-sided leg pain that now goes up into his groin.  There is a question of whether this was shingles but it was not clear-cut.He has had this pain for a month    Review of Systems     Objective:   Physical Exam Alert and in no distress.  He tried to show me his leg but it was difficult to see anything.       Assessment & Plan:  Aortic valve stenosis, etiology of cardiac valve disease unspecified - Plan: Ambulatory referral to Cardiology  Pain of right lower extremity - Plan: nortriptyline (PAMELOR) 10 MG capsule I will try the pamelor for a month to see if it will help. He is to all me then

## 2019-10-27 NOTE — H&P (View-Only) (Signed)
Cardiology Office Note   Date:  11/02/2019   ID:  Gregory, [name] 1951/06/25, MRN [id number]  PCP:  [name], MD  Cardiologist:   Keilynn Marano [name], MD   Chief Complaint  Patient presents with  . Coronary Artery Disease  . Aortic Stenosis      History of Present Illness: Gregory [name] is a 68 y.o. male who is seen at the request of Dr Redmond School for evaluation of CAD. He was last seen by me in October 2015. He has a known history of coronary disease. This is based on a prior CT of the chest in 2014 showing extensive coronary calcification. He had a Myoview study in October 2014 that showed a small area of apical ischema. This was unchanged from 2012 and the patient was asymptomatic so he was treated medially. He also has moderate aortic stenosis.  He has a history of ongoing tobacco abuse, HTN, and hyperlipidemia. More recently he had an Echo done showing his aortic stenosis had progressed and was now in the severe range. Mean gradient 50 mm Hg and valve area 0.84 cm2. Peak velocity 4.7 m/sec. This had progressed from last Echo in 2017.   He states he is doing OK. Denies any chest pain or SOB but is quite inactive. Does only some light housework. Complains of pain in upper buttocks when he walks < 1/2 block. Has to stop and rest. Therefore doesn't do any walking. Uses electric buggy at the store. Still smokes 1 ppd. Complains that feet are numb. Sometimes feels dizzy after taking meds or standing quickly.     Past Medical History:  Diagnosis Date  . Alcohol abuse   . Allergy   . Aortic stenosis   . Asthma   . Bipolar disorder ([place])   . CAD (coronary artery disease)    coronary calcifications 08/2013 CTA  . Carotid artery occlusion   . Chronic back pain   . Claudication ([place])   . Family history of skin cancer   . Heart murmur    slight  . Hyperlipidemia   . Hypertension   . Neuromuscular disorder ([place])   . Pneumonia    hx of  . RBBB    a. 7.2012 low risk  nuclear exam, sm area of apical ischemia.  . Sleep apnea    does not wear CPAP  . Smoker   . Spinal stenosis at L4-L5 level   . Tobacco use disorder     Past Surgical History:  Procedure Laterality Date  . ANAL FISTULECTOMY  09/25/11  . COLONOSCOPY  2009  . ELBOW SURGERY     bilaterally for cubital tunnel  . MAXIMUM ACCESS (MAS)POSTERIOR LUMBAR INTERBODY FUSION (PLIF) 1 LEVEL N/A 10/25/2014   Procedure: LUMBAR FOUR TO FIVE MAXIMUM ACCESS (MAS) POSTERIOR LUMBAR INTERBODY FUSION (PLIF) 1 LEVEL;  Surgeon: Eustace Moore, MD;  Location: [facility];  Service: Neurosurgery;  Laterality: N/A;  . 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 TONSILLECTOMY  age 34  . US ECHOCARDIOGRAPHY  06/20/2010   EF 55-60%  . VASECTOMY     X 2  . VASECTOMY REVERSAL       Current Outpatient Medications  Medication Sig Dispense Refill  . albuterol (PROVENTIL HFA;VENTOLIN HFA) 108 (90 Base) MCG/ACT inhaler Inhale 2 puffs into the lungs every 6 (six) hours as needed for wheezing. 3 Inhaler 1  . amLODipine (NORVASC) 10 MG tablet  TAKE ONE TABLET BY MOUTH DAILY 90 tablet 1  . aspirin EC 81 MG tablet Take 1 tablet (81 mg total) by mouth daily. 30 tablet 0  . divalproex (DEPAKOTE) 500 MG DR tablet Take 4 tablets (2,000 mg total) by mouth at bedtime. (Patient taking differently: Take 1,000 mg by mouth 3 (three) times daily. Pt take 2 tablets total 1000 mg) 480 tablet 3  . finasteride (PROSCAR) 5 MG tablet TAKE ONE TABLET BY MOUTH DAILY 90 tablet 2  . fluticasone (FLONASE) 50 MCG/ACT nasal spray SPRAY TWO SPRAYS IN THE AFFECTED NOSTRIL DAILY 16 g 11  . Fluticasone-Salmeterol (ADVAIR DISKUS) 500-50 MCG/DOSE AEPB Inhale 1 puff into the lungs every 12 (twelve) hours. 180 each 3  . ibuprofen (ADVIL) 200 MG tablet Take 200 mg by mouth every 6 (six) hours.     [name] lamoTRIgine (LAMICTAL) 200 MG tablet Take 1 tablet (200 mg total) by mouth every morning. 90 tablet 3   . lisinopril-hydrochlorothiazide (ZESTORETIC) 20-12.5 MG tablet TAKE ONE TABLET BY MOUTH EVERY MORNING 90 tablet 2  . metoprolol tartrate (LOPRESSOR) 25 MG tablet TAKE ONE-HALF TABLET BY MOUTH DAILY 45 tablet 2  . Multiple Vitamins-Minerals (MULTIVITAMIN ADULTS 50+ PO) Take 1 tablet by mouth.    . nortriptyline (PAMELOR) 10 MG capsule Take 1 capsule (10 mg total) by mouth at bedtime. 30 capsule 0  . silver sulfADIAZINE (SILVADENE) 1 % cream Apply 1 application topically daily. 50 g 0  . atorvastatin (LIPITOR) 80 MG tablet Take 1 tablet (80 mg total) by mouth daily. 90 tablet 3   No current facility-administered medications for this visit.     Allergies:   Codeine    Social History:  The patient  reports that he has been smoking cigarettes. He has a 15.00 pack-year smoking history. He has never used smokeless tobacco. He reports current alcohol use of about 78.0 standard drinks of alcohol per week. He reports that he does not use drugs.   Family History:  The patient's family history includes Cancer in his father, maternal aunt, and maternal grandmother; Diabetes in his brother; Drug abuse in his brother and father; Heart disease in his brother; Heart failure in his mother; Hypertension in his brother; Stroke (age of onset: 63) in his father.    ROS:  Please see the history of present illness.   Otherwise, review of systems are positive for none.   All other systems are reviewed and negative.    PHYSICAL EXAM: VS:  BP (!) 162/72 (BP Location: Left Arm)   Pulse 100   Temp (!) 97.2 F (36.2 C)   Ht 5\' 5"  (1.651 m)   Wt 144 lb 9.6 oz (65.6 kg)   SpO2 99%   BMI 24.06 kg/m  , BMI Body mass index is 24.06 kg/m. GEN: Well nourished, well developed, in no acute distress  HEENT: normal  Neck: no JVD, carotid bruits, or masses Cardiac: RRR; gr 3/6 harsh systolic murmur RUSB radiating to apex and carotids. Carotid upstroke delayed. No  gallops,no edema. S 2 is soft.   Respiratory:  clear to  auscultation bilaterally, normal work of breathing GI: soft, nontender, nondistended, + BS MS: no deformity or atrophy. Femoral pulses are 1+ pedal pulses are nonpalpable bilaterally. Skin: warm and dry, no rash Neuro:  Strength and sensation are intact Psych: euthymic mood, full affect   EKG:  EKG is ordered today. The ekg ordered today demonstrates NSR rate 45. RBBB. I have personally reviewed and interpreted this study.  Recent Labs: 09/29/2019: ALT 30; BUN 15; Creatinine, Ser 0.80; Hemoglobin 15.3; Platelets 195; Potassium 4.2; Sodium 137    Lipid Panel    Component Value Date/Time   CHOL 138 09/29/2019 1459   TRIG 144 09/29/2019 1459   HDL 36 (L) 09/29/2019 1459   CHOLHDL 3.8 09/29/2019 1459   CHOLHDL 5.1 (H) 06/29/2017 1534   VLDL 45 (H) 06/29/2017 1534   LDLCALC 77 09/29/2019 1459      Wt Readings from Last 3 Encounters:  11/02/19 144 lb 9.6 oz (65.6 kg)  10/07/19 145 lb (65.8 kg)  09/29/19 145 lb (65.8 kg)      Other studies Reviewed: Additional studies/ records that were reviewed today include:  Cardiology Nuclear Med Study  Gregory [name] is a 68 y.o. male MRN : [id number] DOB: 05-Oct-1951  Procedure Date: 09/29/2013  Nuclear Med Background  Indication for Stress Test: Evaluation for Ischemia and Post Hospital-9[facility]14 Pre-syncope, Increased enzymes  History: 21014 Echo EF 55-60%, mod AS, 2012 MPS Abnormal, EF 81%, CT- Coronary Calcifications  Cardiac Risk Factors: Carotid Disease, Family History - CAD, Hypertension, Lipids, RBBB and Smoker  Symptoms: Dizziness, Light-Headedness and Near Syncope  Nuclear Pre-Procedure  Caffeine/Decaff Intake: None  NPO After: 9:00pm   O2 Sat: 97% on room air.  IV 0.9% NS with Angio Cath: 22g   IV Site: R Antecubital  IV Started by: Crissie Figures, RN   Chest Size (in): 42  Cup Size: n/a   Height: 5\' 5"  (1.651 m)  Weight: 134 lb (60.782 kg)   BMI: Body mass index is 22.3 kg/(m^2).  Tech Comments: N/A   Nuclear Med Study  1 or  2 day study: 1 day  Stress Test Type: Lexiscan   Reading MD: Mertie Moores, MD  Order Authorizing Provider: Demorris Choyce [name], MD   Resting Radionuclide: Technetium 81m Sestamibi  Resting Radionuclide Dose: 11.0 mCi   Stress Radionuclide: Technetium 31m Sestamibi  Stress Radionuclide Dose: 33.0 mCi   Stress Protocol  Rest HR: 56  Stress HR: 90   Rest BP: 151/73  Stress BP: 156/69   Exercise Time (min): n/a  METS: n/a   Predicted Max HR: 157 bpm  % Max HR: 57.32 bpm  Rate Pressure Product: 14040  Dose of Adenosine (mg): n/a  Dose of Lexiscan: 0.4 mg   Dose of Atropine (mg): n/a  Dose of Dobutamine: n/a mcg/kg/min (at max HR)   Stress Test Technologist: Glade Lloyd, BS-ES  Nuclear Technologist: Annye Rusk, CNMT   Rest Procedure: Myocardial perfusion imaging was performed at rest 45 minutes following the intravenous administration of Technetium 6m Sestamibi.  Rest ECG: NSR-RBBB  Stress Procedure: The patient received IV Lexiscan 0.4 mg over 15-seconds. Patient experienced stomach "hurting" that resolved in recovery. Technetium 6m Sestamibi injected at 30-seconds. Quantitative spect images were obtained after a 45 minute delay.  Stress ECG: No significant change from baseline ECG  QPS  Raw Data Images: Normal; no motion artifact; normal heart/lung ratio.  Stress Images: there is a small- medium sized area of moderately severe attenuation at the apex with normal uptake in the other regions.  Rest Images: Normal homogeneous uptake in all areas of the myocardium.  Subtraction (SDS): There is a small area of reversible ischemia in the apex.  Transient Ischemic Dilatation (Normal <1.22): N/A  Lung/Heart Ratio (Normal <0.45): 0.35  Quantitative Gated Spect Images  QGS EDV: 91 ml  QGS ESV: 21 ml  Impression  Exercise Capacity: Lexiscan with no exercise.  BP Response: Normal blood pressure  response.  Clinical Symptoms: No significant symptoms noted.  ECG Impression: No significant ST segment  change suggestive of ischemia.  Comparison with Prior Nuclear Study: 2012  Overall Impression: Intermediate risk stress nuclear study . There is a small- medium sized area of ischemia at the apex. .  LV Ejection Fraction: 77%. LV Wall Motion: NL LV Function; NL Wall Motion.  Thayer Headings, Brooke Bonito., MD, [facility]  Echo: 08/29/14:Study Conclusions  - Left ventricle: The cavity size was normal. Systolic function was normal. The estimated ejection fraction was in the range of 55% to 60%. Wall motion was normal; there were no regional wall motion abnormalities. Features are consistent with a pseudonormal left ventricular filling pattern, with concomitant abnormal relaxation and increased filling pressure (grade 2 diastolic dysfunction). - Aortic valve: Moderate thickening and calcification. There was moderate stenosis. Valve area: 1.13cm^2(VTI). Valve area: 1.12cm^2 (Vmax).  Echo 10/06/19: IMPRESSIONS    1. Left ventricular ejection fraction, by visual estimation, is 55 to 60%. The left ventricle has normal function. Normal left ventricular size. Left ventricular septal wall thickness was moderately increased. There is moderately increased left  ventricular hypertrophy.  2. Global right ventricle has normal systolic function.The right ventricular size is normal. No increase in right ventricular wall thickness.  3. Left atrial size was normal.  4. Right atrial size was normal.  5. Mild mitral annular calcification.  6. Moderate calcification of the mitral valve leaflet(s).  7. Moderate thickening of the mitral valve leaflet(s).  8. The mitral valve is normal in structure. Trace mitral valve regurgitation.  9. The tricuspid valve is normal in structure. Tricuspid valve regurgitation is mild. 10. The aortic valve has an indeterminant number of cusps Aortic valve regurgitation is mild by color flow Doppler. Severe aortic valve stenosis. 11. There is Severe calcifcation of the aortic valve. 12.  There is Severely thickening of the aortic valve. 13. The pulmonic valve was grossly normal. Pulmonic valve regurgitation is mild by color flow Doppler   ASSESSMENT AND PLAN:  1. Coronary disease. Based on CT in past.  Last  Myoview 2014 study shows a small area of ischemia in the apex. This was unchanged from 2012. He really has no clinical symptoms of angina but activity is very limited.  Recommend continued aspirin, amlodipine, and statin therapy. Will evaluate further with cardiac cath given need for AVR.  2. Hypertension-control is OK.   3. Severe  aortic stenosis.  Progressive by Echo from 2011>2014>2017 and now. Now in severe range. Symptoms difficult to assess since activity is so limited. I feel he needs to be considered for AVR. Will proceed with right and left heart cath. If no significant coronary stenosis he may be a candidate for TAVR. If he has obstructive CAD will need to consider open heart surgery with CABG and AVR unless CAD can be managed with stenting. Will proceed with cardiac cath next week. The procedure and risks were reviewed including but not limited to death, myocardial infarction, stroke, arrythmias, bleeding, transfusion, emergency surgery, dye allergy, or renal dysfunction. The patient voices understanding and is agreeable to proceed.   4. PAD with chronic claudication. Last dopplers in 2011 showed moderate disease with monophasic wave forms. I suspect that his buttocks pain is more claudication than spinal disease. Will update LE arterial dopplers.   5. Tobacco abuse. Have strongly encouraged smoking cessation.  6. Hyperlipidemia. High risk patient. LDL goal < 70. Recommend we increase lipitor to high dose 80 mg daily.   7. Right bundle  branch block with -chronic  8. Bipolar disorder.  9. Remote history of Etoh abuse- sober for years.    Current medicines are reviewed at length with the patient today.  The patient does not have concerns regarding  medicines.  The following changes have been made:  See above  Labs/ tests ordered today include:   Orders Placed This Encounter  Procedures  . Basic metabolic panel  . CBC w/Diff/Platelet  . PT and PTT  . EKG 12-Lead  . VAS [place] LOWER EXTREMITY ARTERIAL DUPLEX     Disposition:   FU TBD  Signed, Erisa Mehlman [name], MD  11/02/2019 10:05 AM    [facility] [address], [place], [place], [address] Phone [phone], Fax [phone]

## 2019-10-27 NOTE — Progress Notes (Signed)
Cardiology Office Note   Date:  11/02/2019   ID:  Gregory, [name] [birth year], MRN [id number]  PCP:  [name], MD  Cardiologist:   Maryon Kemnitz [name], MD   Chief Complaint  Patient presents with  . Coronary Artery Disease  . Aortic Stenosis      History of Present Illness: Gregory [name] is a 68 y.o. male who is seen at the request of Dr Redmond School for evaluation of CAD. He was last seen by me in October 2015. He has a known history of coronary disease. This is based on a prior CT of the chest in 2014 showing extensive coronary calcification. He had a Myoview study in October 2014 that showed a small area of apical ischema. This was unchanged from 2012 and the patient was asymptomatic so he was treated medially. He also has moderate aortic stenosis.  He has a history of ongoing tobacco abuse, HTN, and hyperlipidemia. More recently he had an Echo done showing his aortic stenosis had progressed and was now in the severe range. Mean gradient 50 mm Hg and valve area 0.84 cm2. Peak velocity 4.7 m/sec. This had progressed from last Echo in 2017.   He states he is doing OK. Denies any chest pain or SOB but is quite inactive. Does only some light housework. Complains of pain in upper buttocks when he walks < 1/2 block. Has to stop and rest. Therefore doesn't do any walking. Uses electric buggy at the store. Still smokes 1 ppd. Complains that feet are numb. Sometimes feels dizzy after taking meds or standing quickly.     Past Medical History:  Diagnosis Date  . Alcohol abuse   . Allergy   . Aortic stenosis   . Asthma   . Bipolar disorder ([place])   . CAD (coronary artery disease)    coronary calcifications 08/2013 CTA  . Carotid artery occlusion   . Chronic back pain   . Claudication ([place])   . Family history of skin cancer   . Heart murmur    slight  . Hyperlipidemia   . Hypertension   . Neuromuscular disorder ([place])   . Pneumonia    hx of  . RBBB    a. 7.2012 low risk  nuclear exam, sm area of apical ischemia.  . Sleep apnea    does not wear CPAP  . Smoker   . Spinal stenosis at L4-L5 level   . Tobacco use disorder     Past Surgical History:  Procedure Laterality Date  . ANAL FISTULECTOMY  09/25/11  . COLONOSCOPY  2009  . ELBOW SURGERY     bilaterally for cubital tunnel  . MAXIMUM ACCESS (MAS)POSTERIOR LUMBAR INTERBODY FUSION (PLIF) 1 LEVEL N/A 10/25/2014   Procedure: LUMBAR FOUR TO FIVE MAXIMUM ACCESS (MAS) POSTERIOR LUMBAR INTERBODY FUSION (PLIF) 1 LEVEL;  Surgeon: Eustace Moore, MD;  Location: [facility];  Service: Neurosurgery;  Laterality: N/A;  . 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 TONSILLECTOMY  age 54  . US ECHOCARDIOGRAPHY  06/20/2010   EF 55-60%  . VASECTOMY     X 2  . VASECTOMY REVERSAL       Current Outpatient Medications  Medication Sig Dispense Refill  . albuterol (PROVENTIL HFA;VENTOLIN HFA) 108 (90 Base) MCG/ACT inhaler Inhale 2 puffs into the lungs every 6 (six) hours as needed for wheezing. 3 Inhaler 1  . amLODipine (NORVASC) 10 MG tablet  TAKE ONE TABLET BY MOUTH DAILY 90 tablet 1  . aspirin EC 81 MG tablet Take 1 tablet (81 mg total) by mouth daily. 30 tablet 0  . divalproex (DEPAKOTE) 500 MG DR tablet Take 4 tablets (2,000 mg total) by mouth at bedtime. (Patient taking differently: Take 1,000 mg by mouth 3 (three) times daily. Pt take 2 tablets total 1000 mg) 480 tablet 3  . finasteride (PROSCAR) 5 MG tablet TAKE ONE TABLET BY MOUTH DAILY 90 tablet 2  . fluticasone (FLONASE) 50 MCG/ACT nasal spray SPRAY TWO SPRAYS IN THE AFFECTED NOSTRIL DAILY 16 g 11  . Fluticasone-Salmeterol (ADVAIR DISKUS) 500-50 MCG/DOSE AEPB Inhale 1 puff into the lungs every 12 (twelve) hours. 180 each 3  . ibuprofen (ADVIL) 200 MG tablet Take 200 mg by mouth every 6 (six) hours.     [name] lamoTRIgine (LAMICTAL) 200 MG tablet Take 1 tablet (200 mg total) by mouth every morning. 90 tablet 3   . lisinopril-hydrochlorothiazide (ZESTORETIC) 20-12.5 MG tablet TAKE ONE TABLET BY MOUTH EVERY MORNING 90 tablet 2  . metoprolol tartrate (LOPRESSOR) 25 MG tablet TAKE ONE-HALF TABLET BY MOUTH DAILY 45 tablet 2  . Multiple Vitamins-Minerals (MULTIVITAMIN ADULTS 50+ PO) Take 1 tablet by mouth.    . nortriptyline (PAMELOR) 10 MG capsule Take 1 capsule (10 mg total) by mouth at bedtime. 30 capsule 0  . silver sulfADIAZINE (SILVADENE) 1 % cream Apply 1 application topically daily. 50 g 0  . atorvastatin (LIPITOR) 80 MG tablet Take 1 tablet (80 mg total) by mouth daily. 90 tablet 3   No current facility-administered medications for this visit.     Allergies:   Codeine    Social History:  The patient  reports that he has been smoking cigarettes. He has a 15.00 pack-year smoking history. He has never used smokeless tobacco. He reports current alcohol use of about 78.0 standard drinks of alcohol per week. He reports that he does not use drugs.   Family History:  The patient's family history includes Cancer in his father, maternal aunt, and maternal grandmother; Diabetes in his brother; Drug abuse in his brother and father; Heart disease in his brother; Heart failure in his mother; Hypertension in his brother; Stroke (age of onset: 55) in his father.    ROS:  Please see the history of present illness.   Otherwise, review of systems are positive for none.   All other systems are reviewed and negative.    PHYSICAL EXAM: VS:  BP (!) 162/72 (BP Location: Left Arm)   Pulse 100   Temp (!) 97.2 F (36.2 C)   Ht 5\' 5"  (1.651 m)   Wt 144 lb 9.6 oz (65.6 kg)   SpO2 99%   BMI 24.06 kg/m  , BMI Body mass index is 24.06 kg/m. GEN: Well nourished, well developed, in no acute distress  HEENT: normal  Neck: no JVD, carotid bruits, or masses Cardiac: RRR; gr 3/6 harsh systolic murmur RUSB radiating to apex and carotids. Carotid upstroke delayed. No  gallops,no edema. S 2 is soft.   Respiratory:  clear to  auscultation bilaterally, normal work of breathing GI: soft, nontender, nondistended, + BS MS: no deformity or atrophy. Femoral pulses are 1+ pedal pulses are nonpalpable bilaterally. Skin: warm and dry, no rash Neuro:  Strength and sensation are intact Psych: euthymic mood, full affect   EKG:  EKG is ordered today. The ekg ordered today demonstrates NSR rate 45. RBBB. I have personally reviewed and interpreted this study.  Recent Labs: 09/29/2019: ALT 30; BUN 15; Creatinine, Ser 0.80; Hemoglobin 15.3; Platelets 195; Potassium 4.2; Sodium 137    Lipid Panel    Component Value Date/Time   CHOL 138 09/29/2019 1459   TRIG 144 09/29/2019 1459   HDL 36 (L) 09/29/2019 1459   CHOLHDL 3.8 09/29/2019 1459   CHOLHDL 5.1 (H) 06/29/2017 1534   VLDL 45 (H) 06/29/2017 1534   LDLCALC 77 09/29/2019 1459      Wt Readings from Last 3 Encounters:  11/02/19 144 lb 9.6 oz (65.6 kg)  10/07/19 145 lb (65.8 kg)  09/29/19 145 lb (65.8 kg)      Other studies Reviewed: Additional studies/ records that were reviewed today include:  Cardiology Nuclear Med Study  Gregory [name] is a 68 y.o. male MRN : [id number] DOB: 1951-06-15  Procedure Date: 09/29/2013  Nuclear Med Background  Indication for Stress Test: Evaluation for Ischemia and Post Hospital-[facility] Pre-syncope, Increased enzymes  History: 21014 Echo EF 55-60%, mod AS, 2012 MPS Abnormal, EF 81%, CT- Coronary Calcifications  Cardiac Risk Factors: Carotid Disease, Family History - CAD, Hypertension, Lipids, RBBB and Smoker  Symptoms: Dizziness, Light-Headedness and Near Syncope  Nuclear Pre-Procedure  Caffeine/Decaff Intake: None  NPO After: 9:00pm   O2 Sat: 97% on room air.  IV 0.9% NS with Angio Cath: 22g   IV Site: R Antecubital  IV Started by: Crissie Figures, RN   Chest Size (in): 42  Cup Size: n/a   Height: 5\' 5"  (1.651 m)  Weight: 134 lb (60.782 kg)   BMI: Body mass index is 22.3 kg/(m^2).  Tech Comments: N/A   Nuclear Med Study  1 or  2 day study: 1 day  Stress Test Type: Lexiscan   Reading MD: Mertie Moores, MD  Order Authorizing Provider: Pavneet Markwood [name], MD   Resting Radionuclide: Technetium 53m Sestamibi  Resting Radionuclide Dose: 11.0 mCi   Stress Radionuclide: Technetium 48m Sestamibi  Stress Radionuclide Dose: 33.0 mCi   Stress Protocol  Rest HR: 56  Stress HR: 90   Rest BP: 151/73  Stress BP: 156/69   Exercise Time (min): n/a  METS: n/a   Predicted Max HR: 157 bpm  % Max HR: 57.32 bpm  Rate Pressure Product: 14040  Dose of Adenosine (mg): n/a  Dose of Lexiscan: 0.4 mg   Dose of Atropine (mg): n/a  Dose of Dobutamine: n/a mcg/kg/min (at max HR)   Stress Test Technologist: Glade Lloyd, BS-ES  Nuclear Technologist: Annye Rusk, CNMT   Rest Procedure: Myocardial perfusion imaging was performed at rest 45 minutes following the intravenous administration of Technetium 89m Sestamibi.  Rest ECG: NSR-RBBB  Stress Procedure: The patient received IV Lexiscan 0.4 mg over 15-seconds. Patient experienced stomach "hurting" that resolved in recovery. Technetium 1m Sestamibi injected at 30-seconds. Quantitative spect images were obtained after a 45 minute delay.  Stress ECG: No significant change from baseline ECG  QPS  Raw Data Images: Normal; no motion artifact; normal heart/lung ratio.  Stress Images: there is a small- medium sized area of moderately severe attenuation at the apex with normal uptake in the other regions.  Rest Images: Normal homogeneous uptake in all areas of the myocardium.  Subtraction (SDS): There is a small area of reversible ischemia in the apex.  Transient Ischemic Dilatation (Normal <1.22): N/A  Lung/Heart Ratio (Normal <0.45): 0.35  Quantitative Gated Spect Images  QGS EDV: 91 ml  QGS ESV: 21 ml  Impression  Exercise Capacity: Lexiscan with no exercise.  BP Response: Normal blood pressure  response.  Clinical Symptoms: No significant symptoms noted.  ECG Impression: No significant ST segment  change suggestive of ischemia.  Comparison with Prior Nuclear Study: 2012  Overall Impression: Intermediate risk stress nuclear study . There is a small- medium sized area of ischemia at the apex. .  LV Ejection Fraction: 77%. LV Wall Motion: NL LV Function; NL Wall Motion.  Thayer Headings, Brooke Bonito., MD, [facility]  Echo: 08/29/14:Study Conclusions  - Left ventricle: The cavity size was normal. Systolic function was normal. The estimated ejection fraction was in the range of 55% to 60%. Wall motion was normal; there were no regional wall motion abnormalities. Features are consistent with a pseudonormal left ventricular filling pattern, with concomitant abnormal relaxation and increased filling pressure (grade 2 diastolic dysfunction). - Aortic valve: Moderate thickening and calcification. There was moderate stenosis. Valve area: 1.13cm^2(VTI). Valve area: 1.12cm^2 (Vmax).  Echo 10/06/19: IMPRESSIONS    1. Left ventricular ejection fraction, by visual estimation, is 55 to 60%. The left ventricle has normal function. Normal left ventricular size. Left ventricular septal wall thickness was moderately increased. There is moderately increased left  ventricular hypertrophy.  2. Global right ventricle has normal systolic function.The right ventricular size is normal. No increase in right ventricular wall thickness.  3. Left atrial size was normal.  4. Right atrial size was normal.  5. Mild mitral annular calcification.  6. Moderate calcification of the mitral valve leaflet(s).  7. Moderate thickening of the mitral valve leaflet(s).  8. The mitral valve is normal in structure. Trace mitral valve regurgitation.  9. The tricuspid valve is normal in structure. Tricuspid valve regurgitation is mild. 10. The aortic valve has an indeterminant number of cusps Aortic valve regurgitation is mild by color flow Doppler. Severe aortic valve stenosis. 11. There is Severe calcifcation of the aortic valve. 12.  There is Severely thickening of the aortic valve. 13. The pulmonic valve was grossly normal. Pulmonic valve regurgitation is mild by color flow Doppler   ASSESSMENT AND PLAN:  1. Coronary disease. Based on CT in past.  Last  Myoview 2014 study shows a small area of ischemia in the apex. This was unchanged from 2012. He really has no clinical symptoms of angina but activity is very limited.  Recommend continued aspirin, amlodipine, and statin therapy. Will evaluate further with cardiac cath given need for AVR.  2. Hypertension-control is OK.   3. Severe  aortic stenosis.  Progressive by Echo from 2011>2014>2017 and now. Now in severe range. Symptoms difficult to assess since activity is so limited. I feel he needs to be considered for AVR. Will proceed with right and left heart cath. If no significant coronary stenosis he may be a candidate for TAVR. If he has obstructive CAD will need to consider open heart surgery with CABG and AVR unless CAD can be managed with stenting. Will proceed with cardiac cath next week. The procedure and risks were reviewed including but not limited to death, myocardial infarction, stroke, arrythmias, bleeding, transfusion, emergency surgery, dye allergy, or renal dysfunction. The patient voices understanding and is agreeable to proceed.   4. PAD with chronic claudication. Last dopplers in 2011 showed moderate disease with monophasic wave forms. I suspect that his buttocks pain is more claudication than spinal disease. Will update LE arterial dopplers.   5. Tobacco abuse. Have strongly encouraged smoking cessation.  6. Hyperlipidemia. High risk patient. LDL goal < 70. Recommend we increase lipitor to high dose 80 mg daily.   7. Right bundle  branch block with -chronic  8. Bipolar disorder.  9. Remote history of Etoh abuse- sober for years.    Current medicines are reviewed at length with the patient today.  The patient does not have concerns regarding  medicines.  The following changes have been made:  See above  Labs/ tests ordered today include:   Orders Placed This Encounter  Procedures  . Basic metabolic panel  . CBC w/Diff/Platelet  . PT and PTT  . EKG 12-Lead  . VAS [place] LOWER EXTREMITY ARTERIAL DUPLEX     Disposition:   FU TBD  Signed, Berry Gallacher [name], MD  11/02/2019 10:05 AM    [facility] [address] [place], [place], [address] Phone [phone], Fax [phone]

## 2019-10-29 DIAGNOSIS — J4530 Mild persistent asthma, uncomplicated: Secondary | ICD-10-CM | POA: Insufficient documentation

## 2019-11-02 ENCOUNTER — Ambulatory Visit: Payer: PPO | Admitting: Cardiology

## 2019-11-02 ENCOUNTER — Other Ambulatory Visit: Payer: Self-pay | Admitting: Cardiology

## 2019-11-02 ENCOUNTER — Other Ambulatory Visit: Payer: Self-pay

## 2019-11-02 ENCOUNTER — Encounter: Payer: Self-pay | Admitting: Cardiology

## 2019-11-02 VITALS — BP 162/72 | HR 100 | Temp 97.2°F | Ht 65.0 in | Wt 144.6 lb

## 2019-11-02 DIAGNOSIS — I350 Nonrheumatic aortic (valve) stenosis: Secondary | ICD-10-CM | POA: Diagnosis not present

## 2019-11-02 DIAGNOSIS — I2510 Atherosclerotic heart disease of native coronary artery without angina pectoris: Secondary | ICD-10-CM | POA: Diagnosis not present

## 2019-11-02 DIAGNOSIS — Z720 Tobacco use: Secondary | ICD-10-CM

## 2019-11-02 DIAGNOSIS — I739 Peripheral vascular disease, unspecified: Secondary | ICD-10-CM | POA: Diagnosis not present

## 2019-11-02 DIAGNOSIS — E786 Lipoprotein deficiency: Secondary | ICD-10-CM | POA: Diagnosis not present

## 2019-11-02 LAB — BASIC METABOLIC PANEL
BUN/Creatinine Ratio: 17 (ref 10–24)
BUN: 15 mg/dL (ref 8–27)
CO2: 21 mmol/L (ref 20–29)
Calcium: 10 mg/dL (ref 8.6–10.2)
Chloride: 99 mmol/L (ref 96–106)
Creatinine, Ser: 0.88 mg/dL (ref 0.76–1.27)
GFR calc Af Amer: 101 mL/min/{1.73_m2} (ref >59)
GFR calc non Af Amer: 88 mL/min/{1.73_m2} (ref >59)
Glucose: 88 mg/dL (ref 65–99)
Potassium: 4.6 mmol/L (ref 3.5–5.2)
Sodium: 137 mmol/L (ref 134–144)

## 2019-11-02 LAB — CBC WITH DIFFERENTIAL/PLATELET
Basophils Absolute: 0.1 10*3/uL (ref 0.0–0.2)
Basos: 1 %
EOS (ABSOLUTE): 0.2 10*3/uL (ref 0.0–0.4)
Eos: 4 %
Hematocrit: 47.2 % (ref 37.5–51.0)
Hemoglobin: 16.1 g/dL (ref 13.0–17.7)
Immature Grans (Abs): 0 10*3/uL (ref 0.0–0.1)
Immature Granulocytes: 0 %
Lymphocytes Absolute: 2.5 10*3/uL (ref 0.7–3.1)
Lymphs: 41 %
MCH: 30.7 pg (ref 26.6–33.0)
MCHC: 34.1 g/dL (ref 31.5–35.7)
MCV: 90 fL (ref 79–97)
Monocytes Absolute: 0.6 10*3/uL (ref 0.1–0.9)
Monocytes: 10 %
Neutrophils Absolute: 2.7 10*3/uL (ref 1.4–7.0)
Neutrophils: 44 %
Platelets: 177 10*3/uL (ref 150–450)
RBC: 5.24 x10E6/uL (ref 4.14–5.80)
RDW: 13.6 % (ref 11.6–15.4)
WBC: 6.1 10*3/uL (ref 3.4–10.8)

## 2019-11-02 LAB — PT AND PTT
INR: 1 (ref 0.9–1.2)
Prothrombin Time: 10.9 s (ref 9.1–12.0)
aPTT: 29 s (ref 24–33)

## 2019-11-02 MED ORDER — SODIUM CHLORIDE 0.9% FLUSH: 3.0000 mL | Freq: Two times a day (BID) | INTRAVENOUS | Status: DC

## 2019-11-02 MED ORDER — ATORVASTATIN CALCIUM 80 MG PO TABS: 80.0000 mg | ORAL_TABLET | Freq: Every day | ORAL | 3 refills | Status: DC

## 2019-11-02 NOTE — Patient Instructions (Addendum)
Increase lipitor to 80 mg daily  Stop smoking  We will schedule you for a doppler exam of your legs and a cardiac cath procedure     Follow instructions below          [place] [address] Dept: [phone] Loc: [place]  11/02/2019  You are scheduled for a Cardiac Cath Thursday 11/10/19 with Dr.Thai Hemrick.  1. Please arrive at the [facility] (Main Entrance A) at [facility]: [address] [place], [place] [address] at 5:30 am (This time is two hours before your procedure to ensure your preparation). Free valet parking service is available.   Special note: Every effort is made to have your procedure done on time. Please understand that emergencies sometimes delay scheduled procedures.  2. Diet: Do not eat solid foods after midnight.  The patient may have clear liquids until 5am upon the day of the procedure.  3. Labs: You will need to have blood drawn on Wed 11/02/19 at [facility] 11/02/19 You do not need to be fasting.You are scheduled for Covid Test Monday 11/07/19 at 8:50 am.After Covid test you will need to quarantine until after Cath.  4. Medication instructions in preparation for your procedure:   Hold Lisinopril/HCTZ morning of Cath.   On the morning of your procedure, take your Aspirin 81 mg. and any morning medicines NOT listed above.  You may use sips of water.  5. Plan for one night stay--bring personal belongings. 6. Bring a current list of your medications and current insurance cards. 7. You MUST have a responsible person to drive you home. 8. Someone MUST be with you the first 24 hours after you arrive home or your discharge will be delayed. 9. Please wear clothes that are easy to get on and off and wear slip-on shoes.  Thank you for allowing [place] to care for you!   -- [facility]

## 2019-11-03 ENCOUNTER — Other Ambulatory Visit: Payer: Self-pay | Admitting: Cardiology

## 2019-11-03 ENCOUNTER — Telehealth: Payer: Self-pay | Admitting: Cardiology

## 2019-11-03 ENCOUNTER — Other Ambulatory Visit (HOSPITAL_COMMUNITY): Payer: Self-pay | Admitting: Cardiology

## 2019-11-03 DIAGNOSIS — I739 Peripheral vascular disease, unspecified: Secondary | ICD-10-CM

## 2019-11-03 NOTE — Telephone Encounter (Signed)
Returned call to pt and went over Cath instruction/questions. Verbalized understanding. No further questions.

## 2019-11-03 NOTE — Telephone Encounter (Signed)
Patient had some questions about his heart cath that is scheduled for 11/10/19. He would like someone from the office to call him to answer some questions.  He is scheduled for a vascular scan tomorrow and will be at  [place] office tomorrow if someone wants to talk in person

## 2019-11-04 ENCOUNTER — Ambulatory Visit (HOSPITAL_COMMUNITY)
Admission: RE | Admit: 2019-11-04 | Discharge: 2019-11-04 | Disposition: A | Discharge status: Home / Self Care | Payer: PPO | Source: Ambulatory Visit | Attending: Internal Medicine | Admitting: Internal Medicine

## 2019-11-04 ENCOUNTER — Other Ambulatory Visit: Payer: Self-pay

## 2019-11-04 ENCOUNTER — Other Ambulatory Visit: Payer: Self-pay | Admitting: Family Medicine

## 2019-11-04 DIAGNOSIS — I739 Peripheral vascular disease, unspecified: Secondary | ICD-10-CM | POA: Insufficient documentation

## 2019-11-04 DIAGNOSIS — M79604 Pain in right leg: Secondary | ICD-10-CM

## 2019-11-04 NOTE — Telephone Encounter (Signed)
Is this okay to refill? 

## 2019-11-07 ENCOUNTER — Other Ambulatory Visit (HOSPITAL_COMMUNITY)
Admission: RE | Admit: 2019-11-07 | Discharge: 2019-11-07 | Disposition: A | Discharge status: Home / Self Care | Payer: PPO | Source: Ambulatory Visit | Attending: Cardiology | Admitting: Cardiology

## 2019-11-07 DIAGNOSIS — Z01812 Encounter for preprocedural laboratory examination: Secondary | ICD-10-CM | POA: Diagnosis not present

## 2019-11-07 DIAGNOSIS — Z20828 Contact with and (suspected) exposure to other viral communicable diseases: Secondary | ICD-10-CM | POA: Insufficient documentation

## 2019-11-08 LAB — NOVEL CORONAVIRUS, NAA (HOSP ORDER, SEND-OUT TO REF LAB; TAT 18-24 HRS): SARS-CoV-2, NAA: NOT DETECTED

## 2019-11-09 ENCOUNTER — Telehealth: Payer: Self-pay | Admitting: *Deleted

## 2019-11-09 NOTE — Telephone Encounter (Signed)
Pt contacted pre-catheterization scheduled at [facility] for: Thursday November 10, 2019 7:30 AM Verified arrival time and place: [facility]) at: 5:30 AM   No solid food after midnight prior to cath, clear liquids until 5 AM day of procedure. Contrast allergy: no  Hold: Lisinopril-HCT-AM of procedure.  AM meds can be  taken pre-cath with sip of water including: ASA 81 mg   Confirmed patient has responsible adult to drive home post procedure and observe 24 hours after arriving home: yes  Currently, due to Covid-19 pandemic, only one support person will be allowed with patient. Must be the same support person for that patient's entire stay, will be screened and required to wear a mask. They will be asked to wait in the waiting room for the duration of the patient's stay.  Patients are required to wear a mask when they enter the hospital.      COVID-19 Pre-Screening Questions:  . In the past 7 to 10 days have you had a cough,  shortness of breath, headache, congestion, fever (100 or greater) body aches, chills, sore throat, or sudden loss of taste or sense of smell? Shortness of breath-not new in 7-10 days . Have you been around anyone with known Covid 19? no . Have you been around anyone who is awaiting Covid 19 test results in the past 7 to 10 days? no . Have you been around anyone who has been exposed to Covid 19, or has mentioned symptoms of Covid 19 within the past 7 to 10 days? no   I reviewed procedure/mask/visitor instructions, Covid-19 screening questions with patient, he verbalized understanding, thanked me for call.

## 2019-11-10 ENCOUNTER — Encounter (HOSPITAL_COMMUNITY): Payer: Self-pay | Admitting: Cardiology

## 2019-11-10 ENCOUNTER — Encounter (HOSPITAL_COMMUNITY)
Admission: RE | Disposition: A | Discharge status: Home / Self Care | Payer: Self-pay | Source: Home / Self Care | Attending: Cardiology

## 2019-11-10 ENCOUNTER — Ambulatory Visit (HOSPITAL_COMMUNITY)
Admission: RE | Admit: 2019-11-10 | Discharge: 2019-11-10 | Disposition: A | Discharge status: Home / Self Care | Payer: PPO | Attending: Cardiology | Admitting: Cardiology

## 2019-11-10 DIAGNOSIS — I2510 Atherosclerotic heart disease of native coronary artery without angina pectoris: Secondary | ICD-10-CM | POA: Diagnosis not present

## 2019-11-10 DIAGNOSIS — I2584 Coronary atherosclerosis due to calcified coronary lesion: Secondary | ICD-10-CM | POA: Insufficient documentation

## 2019-11-10 DIAGNOSIS — Z791 Long term (current) use of non-steroidal anti-inflammatories (NSAID): Secondary | ICD-10-CM | POA: Diagnosis not present

## 2019-11-10 DIAGNOSIS — Z951 Presence of aortocoronary bypass graft: Secondary | ICD-10-CM | POA: Diagnosis not present

## 2019-11-10 DIAGNOSIS — Z720 Tobacco use: Secondary | ICD-10-CM | POA: Diagnosis present

## 2019-11-10 DIAGNOSIS — F17210 Nicotine dependence, cigarettes, uncomplicated: Secondary | ICD-10-CM | POA: Diagnosis not present

## 2019-11-10 DIAGNOSIS — Z79899 Other long term (current) drug therapy: Secondary | ICD-10-CM | POA: Diagnosis not present

## 2019-11-10 DIAGNOSIS — F17200 Nicotine dependence, unspecified, uncomplicated: Secondary | ICD-10-CM | POA: Diagnosis present

## 2019-11-10 DIAGNOSIS — I4510 Unspecified right bundle-branch block: Secondary | ICD-10-CM | POA: Insufficient documentation

## 2019-11-10 DIAGNOSIS — Z7951 Long term (current) use of inhaled steroids: Secondary | ICD-10-CM | POA: Diagnosis not present

## 2019-11-10 DIAGNOSIS — Z7982 Long term (current) use of aspirin: Secondary | ICD-10-CM | POA: Insufficient documentation

## 2019-11-10 DIAGNOSIS — I6329 Cerebral infarction due to unspecified occlusion or stenosis of other precerebral arteries: Secondary | ICD-10-CM | POA: Diagnosis not present

## 2019-11-10 DIAGNOSIS — I739 Peripheral vascular disease, unspecified: Secondary | ICD-10-CM | POA: Insufficient documentation

## 2019-11-10 DIAGNOSIS — F319 Bipolar disorder, unspecified: Secondary | ICD-10-CM | POA: Insufficient documentation

## 2019-11-10 DIAGNOSIS — I119 Hypertensive heart disease without heart failure: Secondary | ICD-10-CM | POA: Insufficient documentation

## 2019-11-10 DIAGNOSIS — G4730 Sleep apnea, unspecified: Secondary | ICD-10-CM | POA: Diagnosis not present

## 2019-11-10 DIAGNOSIS — E785 Hyperlipidemia, unspecified: Secondary | ICD-10-CM | POA: Diagnosis not present

## 2019-11-10 DIAGNOSIS — I350 Nonrheumatic aortic (valve) stenosis: Secondary | ICD-10-CM

## 2019-11-10 DIAGNOSIS — Z885 Allergy status to narcotic agent status: Secondary | ICD-10-CM | POA: Diagnosis not present

## 2019-11-10 DIAGNOSIS — I10 Essential (primary) hypertension: Secondary | ICD-10-CM | POA: Diagnosis present

## 2019-11-10 DIAGNOSIS — I452 Bifascicular block: Secondary | ICD-10-CM | POA: Diagnosis present

## 2019-11-10 DIAGNOSIS — Z8249 Family history of ischemic heart disease and other diseases of the circulatory system: Secondary | ICD-10-CM | POA: Diagnosis not present

## 2019-11-10 HISTORY — PX: RIGHT/LEFT HEART CATH AND CORONARY ANGIOGRAPHY: CATH118266

## 2019-11-10 LAB — POCT I-STAT EG7
Bicarbonate: 26.3 mmol/L (ref 20.0–28.0)
Calcium, Ion: 1.12 mmol/L — ABNORMAL LOW (ref 1.15–1.40)
HCT: 39 % (ref 39.0–52.0)
Hemoglobin: 13.3 g/dL (ref 13.0–17.0)
O2 Saturation: 75 %
Potassium: 3.8 mmol/L (ref 3.5–5.1)
Sodium: 140 mmol/L (ref 135–145)
TCO2: 28 mmol/L (ref 22–32)
pCO2, Ven: 47 mmHg (ref 44.0–60.0)
pH, Ven: 7.355 (ref 7.250–7.430)
pO2, Ven: 42 mmHg (ref 32.0–45.0)

## 2019-11-10 LAB — POCT I-STAT 7, (LYTES, BLD GAS, ICA,H+H)
Acid-Base Excess: 1 mmol/L (ref 0.0–2.0)
Bicarbonate: 27 mmol/L (ref 20.0–28.0)
Calcium, Ion: 1.17 mmol/L (ref 1.15–1.40)
HCT: 39 % (ref 39.0–52.0)
Hemoglobin: 13.3 g/dL (ref 13.0–17.0)
O2 Saturation: 99 %
Potassium: 3.9 mmol/L (ref 3.5–5.1)
Sodium: 139 mmol/L (ref 135–145)
TCO2: 28 mmol/L (ref 22–32)
pCO2 arterial: 47.3 mmHg (ref 32.0–48.0)
pH, Arterial: 7.363 (ref 7.350–7.450)
pO2, Arterial: 122 mmHg — ABNORMAL HIGH (ref 83.0–108.0)

## 2019-11-10 SURGERY — RIGHT/LEFT HEART CATH AND CORONARY ANGIOGRAPHY
Anesthesia: LOCAL

## 2019-11-10 MED ORDER — SODIUM CHLORIDE 0.9 % WEIGHT BASED INFUSION: 1.0000 mL/kg/h | INTRAVENOUS | Status: DC

## 2019-11-10 MED ORDER — VERAPAMIL HCL 2.5 MG/ML IV SOLN
INTRAVENOUS | Status: AC
  Filled 2019-11-10: qty 2

## 2019-11-10 MED ORDER — SODIUM CHLORIDE 0.9% FLUSH: 3.0000 mL | INTRAVENOUS | Status: DC | PRN

## 2019-11-10 MED ORDER — FENTANYL CITRATE (PF) 100 MCG/2ML IJ SOLN
INTRAMUSCULAR | Status: AC
  Filled 2019-11-10: qty 2

## 2019-11-10 MED ORDER — LIDOCAINE HCL (PF) 1 % IJ SOLN
INTRAMUSCULAR | Status: DC | PRN
  Administered 2019-11-10 (×2): 2 mL

## 2019-11-10 MED ORDER — LIDOCAINE HCL (PF) 1 % IJ SOLN
INTRAMUSCULAR | Status: AC
  Filled 2019-11-10: qty 30

## 2019-11-10 MED ORDER — SODIUM CHLORIDE 0.9 % IV SOLN: 250.0000 mL | INTRAVENOUS | Status: DC | PRN

## 2019-11-10 MED ORDER — HEPARIN SODIUM (PORCINE) 1000 UNIT/ML IJ SOLN
INTRAMUSCULAR | Status: DC | PRN
  Administered 2019-11-10: 3500 [IU] via INTRAVENOUS

## 2019-11-10 MED ORDER — ASPIRIN 81 MG PO CHEW: 81.0000 mg | CHEWABLE_TABLET | ORAL | Status: DC

## 2019-11-10 MED ORDER — HEPARIN (PORCINE) IN NACL 1000-0.9 UT/500ML-% IV SOLN
INTRAVENOUS | Status: DC | PRN
  Administered 2019-11-10: 500 mL

## 2019-11-10 MED ORDER — MIDAZOLAM HCL 2 MG/2ML IJ SOLN
INTRAMUSCULAR | Status: DC | PRN
  Administered 2019-11-10: 1 mg via INTRAVENOUS

## 2019-11-10 MED ORDER — HEPARIN (PORCINE) IN NACL 1000-0.9 UT/500ML-% IV SOLN
INTRAVENOUS | Status: AC
  Filled 2019-11-10: qty 1000

## 2019-11-10 MED ORDER — ACETAMINOPHEN 325 MG PO TABS: 650.0000 mg | ORAL_TABLET | ORAL | Status: DC | PRN

## 2019-11-10 MED ORDER — MIDAZOLAM HCL 2 MG/2ML IJ SOLN
INTRAMUSCULAR | Status: AC
  Filled 2019-11-10: qty 2

## 2019-11-10 MED ORDER — HEPARIN SODIUM (PORCINE) 1000 UNIT/ML IJ SOLN
INTRAMUSCULAR | Status: AC
  Filled 2019-11-10: qty 1

## 2019-11-10 MED ORDER — SODIUM CHLORIDE 0.9% FLUSH: 3.0000 mL | Freq: Two times a day (BID) | INTRAVENOUS | Status: DC

## 2019-11-10 MED ORDER — IOHEXOL 350 MG/ML SOLN
INTRAVENOUS | Status: DC | PRN
  Administered 2019-11-10: 70 mL via INTRA_ARTERIAL

## 2019-11-10 MED ORDER — SODIUM CHLORIDE 0.9 % WEIGHT BASED INFUSION
3.0000 mL/kg/h | INTRAVENOUS | Status: AC
  Administered 2019-11-10: 3 mL/kg/h via INTRAVENOUS

## 2019-11-10 MED ORDER — ONDANSETRON HCL 4 MG/2ML IJ SOLN: 4.0000 mg | Freq: Four times a day (QID) | INTRAMUSCULAR | Status: DC | PRN

## 2019-11-10 MED ORDER — FENTANYL CITRATE (PF) 100 MCG/2ML IJ SOLN
INTRAMUSCULAR | Status: DC | PRN
  Administered 2019-11-10: 25 ug via INTRAVENOUS

## 2019-11-10 MED ORDER — VERAPAMIL HCL 2.5 MG/ML IV SOLN
INTRAVENOUS | Status: DC | PRN
  Administered 2019-11-10: 10 mL via INTRA_ARTERIAL

## 2019-11-10 SURGICAL SUPPLY — 14 items
CATH 5FR JL3.5 JR4 ANG PIG MP (CATHETERS) ×2 IMPLANT
CATH BALLN WEDGE 5F 110CM (CATHETERS) ×2 IMPLANT
CATH INFINITI 5FR AL1 (CATHETERS) ×2 IMPLANT
DEVICE RAD TR BAND REGULAR (VASCULAR PRODUCTS) ×2 IMPLANT
GLIDESHEATH SLEND SS 6F .021 (SHEATH) ×2 IMPLANT
GUIDEWIRE INQWIRE 1.5J.035X260 (WIRE) ×1 IMPLANT
INQWIRE 1.5J .035X260CM (WIRE) ×2
KIT HEART LEFT (KITS) ×2 IMPLANT
PACK CARDIAC CATHETERIZATION (CUSTOM PROCEDURE TRAY) ×2 IMPLANT
SHEATH GLIDE SLENDER 4/5FR (SHEATH) ×2 IMPLANT
SYR MEDRAD MARK 7 150ML (SYRINGE) ×2 IMPLANT
TRANSDUCER W/STOPCOCK (MISCELLANEOUS) ×2 IMPLANT
TUBING CIL FLEX 10 FLL-RA (TUBING) ×2 IMPLANT
WIRE EMERALD ST .035X150CM (WIRE) ×2 IMPLANT

## 2019-11-10 NOTE — Progress Notes (Signed)
Ambulated in hallway to to bathroom to void. Tol well

## 2019-11-10 NOTE — Progress Notes (Signed)
[facility] consulted for outpt AVR/CABG evaluation. [facility] office will call pt with an appointment.

## 2019-11-10 NOTE — Progress Notes (Signed)
Reviewed discharge instructions with pt and his wife (via telephone) both voice understanding.

## 2019-11-10 NOTE — Discharge Instructions (Signed)
Radial Site Care ° °This sheet gives you information about how to care for yourself after your procedure. Your health care provider may also give you more specific instructions. If you have problems or questions, contact your health care provider. °What can I expect after the procedure? °After the procedure, it is common to have: °· Bruising and tenderness at the catheter insertion area. °Follow these instructions at home: °Medicines °· Take over-the-counter and prescription medicines only as told by your health care provider. °Insertion site care °· Follow instructions from your health care provider about how to take care of your insertion site. Make sure you: °? Wash your hands with soap and water before you change your bandage (dressing). If soap and water are not available, use hand sanitizer. °? Change your dressing as told by your health care provider. °? Leave stitches (sutures), skin glue, or adhesive strips in place. These skin closures may need to stay in place for 2 weeks or longer. If adhesive strip edges start to loosen and curl up, you may trim the loose edges. Do not remove adhesive strips completely unless your health care provider tells you to do that. °· Check your insertion site every day for signs of infection. Check for: °? Redness, swelling, or pain. °? Fluid or blood. °? Pus or a bad smell. °? Warmth. °· Do not take baths, swim, or use a hot tub until your health care provider approves. °· You may shower 24-48 hours after the procedure, or as directed by your health care provider. °? Remove the dressing and gently wash the site with plain soap and water. °? Pat the area dry with a clean towel. °? Do not rub the site. That could cause bleeding. °· Do not apply powder or lotion to the site. °Activity ° °· For 24 hours after the procedure, or as directed by your health care provider: °? Do not flex or bend the affected arm. °? Do not push or pull heavy objects with the affected arm. °? Do not  drive yourself home from the hospital or clinic. You may drive 24 hours after the procedure unless your health care provider tells you not to. °? Do not operate machinery or power tools. °· Do not lift anything that is heavier than 10 lb (4.5 kg), or the limit that you are told, until your health care provider says that it is safe. °· Ask your health care provider when it is okay to: °? Return to work or school. °? Resume usual physical activities or sports. °? Resume sexual activity. °General instructions °· If the catheter site starts to bleed, raise your arm and put firm pressure on the site. If the bleeding does not stop, get help right away. This is a medical emergency. °· If you went home on the same day as your procedure, a responsible adult should be with you for the first 24 hours after you arrive home. °· Keep all follow-up visits as told by your health care provider. This is important. °Contact a health care provider if: °· You have a fever. °· You have redness, swelling, or yellow drainage around your insertion site. °Get help right away if: °· You have unusual pain at the radial site. °· The catheter insertion area swells very fast. °· The insertion area is bleeding, and the bleeding does not stop when you hold steady pressure on the area. °· Your arm or hand becomes pale, cool, tingly, or numb. °These symptoms may represent a serious problem   that is an emergency. Do not wait to see if the symptoms will go away. Get medical help right away. Call your local emergency services (911 in the [place] Do not drive yourself to the hospital. °Summary °· After the procedure, it is common to have bruising and tenderness at the site. °· Follow instructions from your health care provider about how to take care of your radial site wound. Check the wound every day for signs of infection. °· Do not lift anything that is heavier than 10 lb (4.5 kg), or the limit that you are told, until your health care provider says  that it is safe. °This information is not intended to replace advice given to you by your health care provider. Make sure you discuss any questions you have with your health care provider. °Document Released: 01/10/2011 Document Revised: 01/13/2018 Document Reviewed: 01/13/2018 °Elsevier Patient Education © 2020 [employer] ° °

## 2019-11-10 NOTE — Interval H&P Note (Signed)
History and Physical Interval Note:  11/10/2019 7:16 AM  Gregory [name]  has presented today for surgery, with the diagnosis of Aortic stenosis.  The various methods of treatment have been discussed with the patient and family. After consideration of risks, benefits and other options for treatment, the patient has consented to  Procedure(s): RIGHT/LEFT HEART CATH AND CORONARY ANGIOGRAPHY (N/A) as a surgical intervention.  The patient's history has been reviewed, patient examined, no change in status, stable for surgery.  I have reviewed the patient's chart and labs.  Questions were answered to the patient's satisfaction.     [name] MD,FACC 11/10/2019 7:16 AM

## 2019-11-11 ENCOUNTER — Encounter: Payer: Self-pay | Admitting: Thoracic Surgery (Cardiothoracic Vascular Surgery)

## 2019-11-11 ENCOUNTER — Encounter: Payer: Self-pay | Admitting: *Deleted

## 2019-11-11 ENCOUNTER — Other Ambulatory Visit: Payer: Self-pay

## 2019-11-11 ENCOUNTER — Institutional Professional Consult (permissible substitution): Payer: PPO | Admitting: Thoracic Surgery (Cardiothoracic Vascular Surgery)

## 2019-11-11 ENCOUNTER — Other Ambulatory Visit: Payer: Self-pay | Admitting: *Deleted

## 2019-11-11 VITALS — BP 165/75 | HR 61 | Temp 97.7°F | Resp 20 | Ht 65.5 in | Wt 146.0 lb

## 2019-11-11 DIAGNOSIS — I2510 Atherosclerotic heart disease of native coronary artery without angina pectoris: Secondary | ICD-10-CM

## 2019-11-11 DIAGNOSIS — I350 Nonrheumatic aortic (valve) stenosis: Secondary | ICD-10-CM

## 2019-11-11 DIAGNOSIS — Z01818 Encounter for other preprocedural examination: Secondary | ICD-10-CM

## 2019-11-11 NOTE — Patient Instructions (Signed)
   Continue taking all current medications without change through the day before surgery.  Make sure to bring all of your medications with you when you come for your Pre-Admission Testing appointment at [facility] Short-Stay [facility].  Have nothing to eat or drink after midnight the night before surgery.  On the morning of surgery take only Pamelor, Lamictal and Metoprolol with a sip of water.  At your upcoming appointment for Pre-Admission Testing at the [facility]Stay [facility] you will be asked to sign permission forms for your upcoming surgery.    By definition your signature on these permission forms implies that you and/or your designee provide full informed consent for your upcoming planned surgical procedure(s), that alternative treatment options have been discussed, that you understand and accept any and all potential risks, and that you have some understanding of what to expect for your post-operative convalescence.  For any major cardiac surgical procedure operative risks include but are not limited to at least a small risk of death, stroke or other neurologic complication, myocardial infarction, congestive heart failure, respiratory failure, renal failure, bleeding requiring blood transfusion and/or reexploration, irregular heart rhythm, heart block or bradycardia requiring permanent pacemaker, pneumonia, pericardial effusion, pleural effusion, wound infection, pulmonary embolus or other thromboembolic complication, chronic pain, or other delayed complications related to the specific procedure(s) performed.  Please schedule a follow-up appointment in our office prior to your surgery if you have any unresolved questions about your planned surgical procedure and/or the associated risks.

## 2019-11-11 NOTE — Progress Notes (Signed)
[address]       [name] [address]             [phone]     CARDIOTHORACIC SURGERY CONSULTATION REPORT  Referring Provider is [name], [name], MD PCP is [name], MD  Chief Complaint  Gregory [name] presents with   Coronary Artery Disease    Surgical eval, Cardiac Cath 11/10/19, ECHO 10/06/19   Aortic Stenosis    HPI:  Gregory [name] is a 68 year old male with history of aortic stenosis, coronary artery disease, hypertension, hyperlipidemia, chronic right bundle branch block and left anterior fascicular block, spinal stenosis with chronic back pain and lower extremity pain with limited mobility, peripheral arterial disease, and a strong family history of coronary artery disease who has been referred for surgical consultation to discuss treatment options for management of aortic stenosis and severe single-vessel coronary artery disease.  Gregory [name] has known history of coronary artery disease and aortic stenosis was been followed intermittently by Dr. [name] and regularly by his primary care physician for many years.  Previous echocardiograms have documented the presence of normal left ventricular systolic function with moderate aortic stenosis.  CT scan of the chest in 2014 revealed extensive coronary calcification.  Stress Myoview exam at that time revealed a small area of apical ischemia.  The Gregory [name] was asymptomatic and treated medically at the time.  Recent follow-up echocardiogram revealed significant progression of aortic stenosis with preserved left ventricular systolic function.  He was seen in follow-up by Dr. [name] and underwent diagnostic cardiac catheterization on November 10, 2019 which confirmed the presence of severe aortic stenosis and revealed critical single-vessel coronary artery disease with 99% stenosis of the mid left anterior descending coronary artery.  Cardiothoracic surgical consultation was requested.  Gregory [name] is married and lives locally  in [place] with his wife.  He has been retired for more than 5 years having previously spent his career in the [employer].  He lives a sedentary lifestyle primarily because he has been limited for quite some time by chronic back pain related to spinal stenosis.  He can only ambulate relatively short distances before he is limited because of chronic pain in his lower back, buttocks, and both lower legs.  He denies any symptoms of exertional chest pain or exertional shortness of breath, but he admits that he is not very active.  He has never had any symptoms of resting shortness of breath, PND, orthopnea, or lower extremity edema.  The Gregory [name] is a recovered alcoholic and has remained stable on medical therapy for bipolar disorder.  Past Medical History:  Diagnosis Date   Alcohol abuse    Allergy    Aortic stenosis    Asthma    Bipolar disorder ([place])    CAD (coronary artery disease)    coronary calcifications 08/2013 CTA   Carotid artery occlusion    Chronic back pain    Claudication (HCC)    Family history of skin cancer    Heart murmur    Hyperlipidemia    Hypertension    Neuromuscular disorder (HCC)    Obsessive compulsive disorder    PAD (peripheral artery disease) (HCC)    Peripheral neuropathy    Pneumonia    RBBB    RBBB (right bundle branch block with left anterior fascicular block)    Severe aortic stenosis    Sleep apnea    does not wear CPAP   Smoker    Spinal stenosis at L4-L5  level    Tobacco abuse    Tobacco use disorder     Past Surgical History:  Procedure Laterality Date   ANAL FISTULECTOMY  09/25/11   COLONOSCOPY  2009   ELBOW SURGERY     bilaterally for cubital tunnel   MAXIMUM ACCESS (MAS)POSTERIOR LUMBAR INTERBODY FUSION (PLIF) 1 LEVEL N/A 10/25/2014   Procedure: LUMBAR FOUR TO FIVE MAXIMUM ACCESS (MAS) POSTERIOR LUMBAR INTERBODY FUSION (PLIF) 1 LEVEL;  Surgeon: Eustace Moore, MD;  Location: [facility];   Service: Neurosurgery;  Laterality: N/A;   RIGHT/LEFT HEART CATH AND CORONARY ANGIOGRAPHY N/A 11/10/2019   Procedure: RIGHT/LEFT HEART CATH AND CORONARY ANGIOGRAPHY;  Surgeon: [name], [name], MD;  Location: [facility];  Service: Cardiovascular;  Laterality: N/A;   SKIN BIOPSY Left 10/12/2018   shave forehead Hypertrophic actinic kertosis with features of a verruca   TONSILLECTOMY  age 58   VASECTOMY     X 2   VASECTOMY REVERSAL      Family History  Problem Relation Age of Onset   Stroke Father 5   Cancer Father        skin   Drug abuse Father    Hypertension Brother    Diabetes Brother    Drug abuse Brother    Heart disease Brother        s/p CABG, pacemaker   Heart failure Mother    Cancer Maternal Aunt        skin   Cancer Maternal Grandmother        liver    Social History   Socioeconomic History   Marital status: Married    Spouse name: Not on file   Number of children: Not on file   Years of education: Not on file   Highest education level: Not on file  Occupational History   Not on file  Social Needs   Financial resource strain: Not on file   Food insecurity    Worry: Not on file    Inability: Not on file   Transportation needs    Medical: Not on file    Non-medical: Not on file  Tobacco Use   Smoking status: Current Every Day Smoker    Packs/day: 1.00    Years: 15.00    Pack years: 15.00    Types: Cigarettes   Smokeless tobacco: Never Used   Tobacco comment: Pt had stopped smoking X10 years/ currently smoking a pack a day   Substance and Sexual Activity   Alcohol use: Yes    Alcohol/week: 78.0 standard drinks    Types: 50 Glasses of wine, 28 Standard drinks or equivalent per week    Comment: 2 glasses of wine per night.   Drug use: No   Sexual activity: Yes  Lifestyle   Physical activity    Days per week: Not on file    Minutes per session: Not on file   Stress: Not on file  Relationships   Social  connections    Talks on phone: Not on file    Gets together: Not on file    Attends religious service: Not on file    Active member of club or organization: Not on file    Attends meetings of clubs or organizations: Not on file    Relationship status: Not on file   Intimate partner violence    Fear of current or ex partner: Not on file    Emotionally abused: Not on file    Physically abused:  Not on file    Forced sexual activity: Not on file  Other Topics Concern   Not on file  Social History Narrative   Lives in [place] with wife.  Does not routinely exercise.  Sedentary r/t chronic lbp.    Current Outpatient Medications  Medication Sig Dispense Refill   albuterol (PROVENTIL HFA;VENTOLIN HFA) 108 (90 Base) MCG/ACT inhaler Inhale 2 puffs into the lungs every 6 (six) hours as needed for wheezing. 3 Inhaler 1   amLODipine (NORVASC) 10 MG tablet TAKE ONE TABLET BY MOUTH DAILY 90 tablet 1   aspirin EC 81 MG tablet Take 1 tablet (81 mg total) by mouth daily. 30 tablet 0   atorvastatin (LIPITOR) 80 MG tablet Take 1 tablet (80 mg total) by mouth daily. 90 tablet 3   divalproex (DEPAKOTE) 500 MG DR tablet Take 4 tablets (2,000 mg total) by mouth at bedtime. (Gregory [name] taking differently: Take 1,000 mg by mouth at bedtime. ) 480 tablet 3   finasteride (PROSCAR) 5 MG tablet TAKE ONE TABLET BY MOUTH DAILY 90 tablet 2   fluticasone (FLONASE) 50 MCG/ACT nasal spray SPRAY TWO SPRAYS IN THE AFFECTED NOSTRIL DAILY 16 g 11   Fluticasone-Salmeterol (ADVAIR DISKUS) 500-50 MCG/DOSE AEPB Inhale 1 puff into the lungs every 12 (twelve) hours. 180 each 3   ibuprofen (ADVIL) 200 MG tablet Take 400-800 mg by mouth every 8 (eight) hours as needed for moderate pain.      lamoTRIgine (LAMICTAL) 200 MG tablet Take 1 tablet (200 mg total) by mouth every morning. 90 tablet 3   lisinopril-hydrochlorothiazide (ZESTORETIC) 20-12.5 MG tablet TAKE ONE TABLET BY MOUTH EVERY MORNING 90 tablet 2   metoprolol tartrate  (LOPRESSOR) 25 MG tablet TAKE ONE-HALF TABLET BY MOUTH DAILY (Gregory [name] taking differently: Take 12.5 mg by mouth daily. ) 45 tablet 2   Multiple Vitamins-Minerals (MULTIVITAMIN ADULTS 50+ PO) Take 1 tablet by mouth daily.      nortriptyline (PAMELOR) 10 MG capsule TAKE ONE CAPSULE BY MOUTH EVERY NIGHT AT BEDTIME 30 capsule 1   Current Facility-Administered Medications  Medication Dose Route Frequency Provider Last Rate Last Dose   sodium chloride flush (NS) 0.9 % injection 3 mL  3 mL Intravenous Q12H [name], [name], MD        Allergies  Allergen Reactions   Codeine Anaphylaxis      Review of Systems:   General:  normal appetite, normal energy, no weight gain, no weight loss, no fever  Cardiac:  no chest pain with exertion, no chest pain at rest, no SOB with exertion, no resting SOB, no PND, no orthopnea, no palpitations, no arrhythmia, no atrial fibrillation, no LE edema, no dizzy spells, no syncope  Respiratory:  no shortness of breath, no home oxygen, no productive cough, + dry cough, occasional bronchitis, no wheezing, no hemoptysis, + asthma, no pain with inspiration or cough, no sleep apnea, no CPAP at night  GI:   no difficulty swallowing, no reflux, no frequent heartburn, no hiatal hernia, no abdominal pain, occasional constipation, no diarrhea, no hematochezia, no hematemesis, no melena  GU:   no dysuria,  no frequency, no urinary tract infection, no hematuria, + enlarged prostate, no kidney stones, no kidney disease  Vascular:  + pain suggestive of claudication, no pain in feet, no leg cramps, no varicose veins, no DVT, no non-healing foot ulcer  Neuro:   no stroke, no TIA's, no seizures, no headaches, no temporary blindness one eye,  no slurred speech, ? peripheral neuropathy, + chronic  pain, + instability of gait, no memory/cognitive dysfunction  Musculoskeletal: + arthritis, no joint swelling, no myalgias, + difficulty walking, limited mobility   Skin:   no rash, no itching,  no skin infections, no pressure sores or ulcerations  Psych:   no anxiety, no depression, + nervousness, no unusual recent stress  Eyes:   no blurry vision, no floaters, no recent vision changes, + wears glasses or contacts  ENT:   no hearing loss, no loose or painful teeth, no dentures, last saw dentist 2018  Hematologic:  no easy bruising, no abnormal bleeding, no clotting disorder, no frequent epistaxis  Endocrine:  no diabetes, does not check CBG's at home     Physical Exam:   BP (!) 165/75    Pulse 61    Temp 97.7 F (36.5 C) (Skin)    Resp 20    Ht 5' 5.5" (1.664 m)    Wt 146 lb (66.2 kg)    SpO2 96% Comment: RA   BMI 23.93 kg/m   General:    well-appearing  HEENT:  Unremarkable   Neck:   no JVD, no bruits, no adenopathy   Chest:   clear to auscultation, symmetrical breath sounds, no wheezes, no rhonchi  CV:   RRR, grade III/VI murmur   Abdomen:  soft, non-tender, no masses   Extremities:  warm, well-perfused, pulses diminished, no LE edema  Rectal/GU  Deferred  Neuro:   Grossly non-focal and symmetrical throughout  Skin:   Clean and dry, no rashes, no breakdown   Diagnostic Tests:  ECHOCARDIOGRAM REPORT      Gregory [name] Name:   Gregory [name] Date of Exam: 10/06/2019 Medical Rec #:  [id number]          Height:       65.0 in Accession #:    [id number]         Weight:       145.0 lb Date of Birth:  1951-12-08          BSA:          1.73 m Gregory [name] Age:    76 years           BP:           144/73 mmHg Gregory [name] Gender: M                  HR:           51 bpm. Exam Location:  [address]  Procedure: 2D Echo, Cardiac Doppler and Color Doppler  Indications:    I35.0 Nonrheumatic aortic (valve) stenosis   History:        Gregory [name] has prior history of Echocardiogram examinations, most                 recent 03/05/2016. Arrythmias:RBBB Risk Factors:Current Smoker                 and Hypertension. Obstructive sleep apnea.   Sonographer:    Diamond Nickel RCS Referring Phys:  [place]    1. Left ventricular ejection fraction, by visual estimation, is 55 to 60%. The left ventricle has normal function. Normal left ventricular size. Left ventricular septal wall thickness was moderately increased. There is moderately increased left  ventricular hypertrophy.  2. Global right ventricle has normal systolic function.The right ventricular size is normal. No increase in right ventricular wall thickness.  3. Left atrial size was normal.  4. Right atrial size was normal.  5. Mild mitral annular calcification.  6. Moderate calcification of the mitral valve leaflet(s).  7. Moderate thickening of the mitral valve leaflet(s).  8. The mitral valve is normal in structure. Trace mitral valve regurgitation.  9. The tricuspid valve is normal in structure. Tricuspid valve regurgitation is mild. 10. The aortic valve has an indeterminant number of cusps Aortic valve regurgitation is mild by color flow Doppler. Severe aortic valve stenosis. 11. There is Severe calcifcation of the aortic valve. 12. There is Severely thickening of the aortic valve. 13. The pulmonic valve was grossly normal. Pulmonic valve regurgitation is mild by color flow Doppler.  FINDINGS  Left Ventricle: Left ventricular ejection fraction, by visual estimation, is 55 to 60%. The left ventricle has normal function. Left ventricular septal wall thickness was moderately increased. There is moderately increased left ventricular hypertrophy.  Normal left ventricular size.  Right Ventricle: The right ventricular size is normal. No increase in right ventricular wall thickness. Global RV systolic function is has normal systolic function.  Left Atrium: Left atrial size was normal in size.  Right Atrium: Right atrial size was normal in size  Pericardium: There is no evidence of pericardial effusion.  Mitral Valve: The mitral valve is normal in structure. There is moderate thickening of  the mitral valve leaflet(s). There is moderate calcification of the mitral valve leaflet(s). Mild mitral annular calcification. Trace mitral valve regurgitation.  Tricuspid Valve: The tricuspid valve is normal in structure. Tricuspid valve regurgitation is mild by color flow Doppler.  Aortic Valve: The aortic valve has an indeterminant number of cusps. There is Severely thickening and Severe calcifcation of the aortic valve. Aortic valve regurgitation is mild by color flow Doppler. Aortic regurgitation PHT measures 522 msec. Severe  aortic stenosis is present. There is Severely thickening of the aortic valve. Severe calcifcation. Aortic valve mean gradient measures 50.0 mmHg. Aortic valve peak gradient measures 89.0 mmHg. Aortic valve area, by VTI measures 0.84 cm.  Pulmonic Valve: The pulmonic valve was grossly normal. Pulmonic valve regurgitation is mild by color flow Doppler.  Aorta: The aortic root is normal in size and structure.  IAS/Shunts: No atrial level shunt detected by color flow Doppler.     LEFT VENTRICLE PLAX 2D LVIDd:         4.00 cm  Diastology LVIDs:         2.20 cm  LV e' lateral:   8.49 cm/s LV PW:         1.50 cm  LV E/e' lateral: 9.3 LV IVS:        1.60 cm  LV e' medial:    5.66 cm/s LVOT diam:     2.00 cm  LV E/e' medial:  13.9 LV SV:         54 ml LV SV Index:   30.83 LVOT Area:     3.14 cm    RIGHT VENTRICLE RV Basal diam:  2.40 cm RV S prime:     13.20 cm/s TAPSE (M-mode): 2.4 cm  LEFT ATRIUM             Index       RIGHT ATRIUM           Index LA diam:        3.50 cm 2.03 cm/m  RA Area:     11.20 cm LA Vol (A2C):   44.1 ml 25.56 ml/m RA Volume:   21.90 ml  12.69 ml/m LA Vol (A4C):   35.9 ml 20.81 ml/m  LA Biplane Vol: 40.9 ml 23.70 ml/m  AORTIC VALVE AV Area (Vmax):    0.71 cm AV Area (Vmean):   0.68 cm AV Area (VTI):     0.84 cm AV Vmax:           471.67 cm/s AV Vmean:          328.000 cm/s AV VTI:            1.093 m AV Peak  Grad:      89.0 mmHg AV Mean Grad:      50.0 mmHg LVOT Vmax:         106.00 cm/s LVOT Vmean:        71.500 cm/s LVOT VTI:          0.293 m LVOT/AV VTI ratio: 0.27 AI PHT:            522 msec   AORTA Ao Root diam: 2.70 cm  MITRAL VALVE MV Area (PHT): 2.76 cm              SHUNTS MV PHT:        79.75 msec            Systemic VTI:  0.29 m MV Decel Time: 275 msec              Systemic Diam: 2.00 cm MV E velocity: 78.80 cm/s  103 cm/s MV A velocity: 114.00 cm/s 70.3 cm/s MV E/A ratio:  0.69        1.5    Jenkins Rouge MD Electronically signed by Jenkins Rouge MD Signature Date/Time: 10/06/2019/4:42:21 PM    RIGHT/LEFT HEART CATH AND CORONARY ANGIOGRAPHY  Conclusion    Prox LAD to Mid LAD lesion is 99% stenosed.  Mid Cx to Dist Cx lesion is 50% stenosed.  Mid RCA lesion is 30% stenosed.  The left ventricular systolic function is normal.  LV end diastolic pressure is normal.  The left ventricular ejection fraction is 55-65% by visual estimate.  There is severe aortic valve stenosis.   1. Critical single vessel obstructive CAD with 99% mid LAD - heavily calcified 2. Severe aortic stenosis. Mean gradient 47 mm Hg. AVA 0.68 cm squared with index 0.39. 3. Normal LV filling pressures 4. Normal Right heart pressures 5. Normal cardiac output.  Plan: referral to CT surgery for combined AVR and CABG.    Recommendations  Antiplatelet/Anticoag Recommend Aspirin 81mg  daily for moderate CAD.  Indications  Severe aortic stenosis [I35.0 (ICD-10-CM)]  Procedural Details  Technical Details Indication: 68 yo WM with severe aortic stenosis and severe PAD  Procedural Details: The right wrist was prepped, draped, and anesthetized with 1% lidocaine. Using the modified Seldinger technique a 6 Fr slender sheath was placed in the right radial artery and a 5 French sheath was placed in the right brachial vein. A Swan-Ganz catheter was used for the right heart catheterization.  Standard protocol was followed for recording of right heart pressures and sampling of oxygen saturations. Fick cardiac output was calculated. Standard Judkins catheters were used for selective coronary angiography and left ventriculography. There were no immediate procedural complications. The Gregory [name] was transferred to the post catheterization recovery area for further monitoring. Contrast: 70 cc Estimated blood loss <50 mL.   During this procedure medications were administered to achieve and maintain moderate conscious sedation while the Gregory [name]'s heart rate, blood pressure, and oxygen saturation were continuously monitored and I was present face-to-face 100% of this time.   Contrast   Total Dose  70 mL    Radiation/Fluoro  Fluoro time: 7 (min) DAP: 16127 (mGycm2) Cumulative Air Kerma: Q000111Q (mGy)  Complications  Complications documented before study signed (11/10/2019 [id number] AM)   No complications were associated with this study.  Documented by [name], [name], MD - 11/10/2019 8:14 AM    Coronary Findings  Diagnostic Dominance: Right Left Main  Vessel was injected. Vessel is normal in caliber. Vessel is angiographically normal.  Left Anterior Descending  Collaterals  Mid LAD filled by collaterals from RPDA.    Prox LAD to Mid LAD lesion 99% stenosed  Prox LAD to Mid LAD lesion is 99% stenosed. The lesion is segmental. The lesion is severely calcified.  Left Circumflex  Mid Cx to Dist Cx lesion 50% stenosed  Mid Cx to Dist Cx lesion is 50% stenosed.  Right Coronary Artery  Mid RCA lesion 30% stenosed  Mid RCA lesion is 30% stenosed.  Intervention  No interventions have been documented. Wall Motion  Resting               Left Heart  Left Ventricle The left ventricular size is normal. The left ventricular systolic function is normal. LV end diastolic pressure is normal. The left ventricular ejection fraction is 55-65% by visual estimate. No regional wall motion  abnormalities.  Aortic Valve There is severe aortic valve stenosis. The aortic valve is calcified. There is restricted aortic valve motion.  Coronary Diagrams  Diagnostic Dominance: Right  Intervention  Implants   No implant documentation for this case.  Syngo Images  Show images for CARDIAC CATHETERIZATION  Images on Long Term Storage  Show images for Marshell, Rotenberg "Roderic [place]"   Link to Procedure Log  Procedure Log    Hemo Data   Most Recent Value  Fick Cardiac Output 4.42 L/min  Fick Cardiac Output Index 2.53 (L/min)/BSA  Aortic Mean Gradient 46.8 mmHg  Aortic Peak Gradient 49 mmHg  Aortic Valve Area 0.68  Aortic Value Area Index 0.39 cm2/BSA  RA A Wave 5 mmHg  RA V Wave 2 mmHg  RA Mean 1 mmHg  RV Systolic Pressure 30 mmHg  RV Diastolic Pressure 0 mmHg  RV EDP 1 mmHg  PA Systolic Pressure 32 mmHg  PA Diastolic Pressure 11 mmHg  PA Mean 17 mmHg  PW A Wave 13 mmHg  PW V Wave 9 mmHg  PW Mean 7 mmHg  AO Systolic Pressure 99991111 mmHg  AO Diastolic Pressure 47 mmHg  AO Mean 71 mmHg  LV Systolic Pressure 0000000 mmHg  LV Diastolic Pressure 7 mmHg  LV EDP 26 mmHg  AOp Systolic Pressure 123456 mmHg  AOp Diastolic Pressure 40 mmHg  AOp Mean Pressure 66 mmHg  LVp Systolic Pressure 123XX123 mmHg  LVp Diastolic Pressure 8 mmHg  LVp EDP Pressure 27 mmHg  QP/QS 1  TPVR Index 6.72 HRUI  TSVR Index 28.06 HRUI  PVR SVR Ratio 0.14  TPVR/TSVR Ratio 0.24      Impression:  Gregory [name] has stage C1  Asymptomatic very severe aortic stenosis with severe single-vessel coronary artery disease including critical 99% stenosis of the mid left anterior descending coronary artery.  He denies any symptoms of exertional shortness of breath or chest discomfort, although the Gregory [name] does admit that he lives a very sedentary lifestyle and is not physically very active.  I have personally reviewed the Gregory [name]'s recent transthoracic echocardiogram and diagnostic cardiac catheterization.  The  aortic valve is trileaflet with severe thickening, calcification, and restricted leaflet mobility involving all 3  leaflets of the aortic valve.  The valve leaflet calcification is quite bulky and the degree of leaflet restriction appears extremely severe.  Peak velocity across aortic valve measured between 4.7 and 5.0 m/s corresponding to mean transvalvular gradient estimated as high as 50 mmHg.  Diagnostic cardiac catheterization confirmed the presence of severe aortic stenosis and revealed critical single-vessel coronary artery disease with long segment 99% stenosis of the mid left anterior descending coronary artery.  Despite the absence of symptoms at this time I agree the Gregory [name] would benefit from elective aortic valve replacement and coronary revascularization.  Risks associated with conventional surgery would be expected to be relatively low and the Gregory [name]'s left anterior descending coronary artery stenosis does not appear favorable for PCI and stenting.  Under the circumstances I feel the Gregory [name] would probably best be treated with conventional surgical aortic valve replacement and coronary artery bypass grafting.   Plan:  The Gregory [name] and his wife were counseled at length regarding treatment alternatives for management of severe aortic stenosis and coronary artery disease including continued medical therapy versus proceeding with aortic valve replacement and coronary artery bypass grafting in the near future.  The natural history of aortic stenosis was reviewed, as was long term prognosis with medical therapy alone.  Less invasive options were discussed as well including transcatheter aortic valve replacement with or without PCI and stenting for management of his coronary artery disease.  Discussion was held comparing the relative risks of mechanical valve replacement with need for lifelong anticoagulation versus use of a bioprosthetic tissue valve and the associated potential for late structural  valve deterioration and failure.  This discussion was placed in the context of the Gregory [name]'s particular circumstances, and as a result the Gregory [name] specifically requests that their valve be replaced using a bioprosthetic tissue valve.  Expectations for the Gregory [name]'s postoperative convalescence were discussed.  The Gregory [name] understands and accepts all potential associated risks of surgery including but not limited to risk of death, stroke, myocardial infarction, congestive heart failure, respiratory failure, renal failure, pneumonia, bleeding requiring blood transfusion and or reexploration, arrhythmia, heart block or bradycardia requiring permanent pacemaker, aortic dissection or other major vascular complication, pleural effusions or other delayed complications related to continued congestive heart failure, other late complications related to valve replacement including structural valve deterioration and failure, thrombosis, endocarditis, or paravalvular leak, and late recurrence of symptomatic ischemic heart disease.  The importance of long-term risk factor modification have been emphasized.  We tentatively plan to proceed with elective aortic valve replacement and coronary artery bypass grafting on November 24, 2019.  The Gregory [name] will undergo cardiac gated CTA of the heart and CTA of the aorta and iliac vessels prior to surgery to make sure there are no complicating anatomical circumstances that would substantially alter surgical plans or surgical risk.  The Gregory [name] will be referred to the dental service for preoperative dental examination and clearance.  All questions have been answered.     I spent in excess of 90 minutes during the conduct of this office consultation and >50% of this time involved direct face-to-face encounter with the Gregory [name] for counseling and/or coordination of their care.    Valentina Gu. Roxy Manns, MD 11/11/2019 10:09 AM

## 2019-11-14 ENCOUNTER — Other Ambulatory Visit: Payer: Self-pay

## 2019-11-14 ENCOUNTER — Ambulatory Visit (HOSPITAL_COMMUNITY): Payer: Self-pay | Admitting: Dentistry

## 2019-11-14 ENCOUNTER — Encounter (HOSPITAL_COMMUNITY): Payer: Self-pay | Admitting: Dentistry

## 2019-11-14 VITALS — BP 146/66 | HR 50 | Temp 98.5°F

## 2019-11-14 DIAGNOSIS — I350 Nonrheumatic aortic (valve) stenosis: Secondary | ICD-10-CM

## 2019-11-14 DIAGNOSIS — K029 Dental caries, unspecified: Secondary | ICD-10-CM

## 2019-11-14 DIAGNOSIS — K045 Chronic apical periodontitis: Secondary | ICD-10-CM

## 2019-11-14 DIAGNOSIS — K031 Abrasion of teeth: Secondary | ICD-10-CM

## 2019-11-14 DIAGNOSIS — K08409 Partial loss of teeth, unspecified cause, unspecified class: Secondary | ICD-10-CM

## 2019-11-14 DIAGNOSIS — K0889 Other specified disorders of teeth and supporting structures: Secondary | ICD-10-CM

## 2019-11-14 DIAGNOSIS — M264 Malocclusion, unspecified: Secondary | ICD-10-CM

## 2019-11-14 DIAGNOSIS — I2510 Atherosclerotic heart disease of native coronary artery without angina pectoris: Secondary | ICD-10-CM

## 2019-11-14 DIAGNOSIS — K0530 Chronic periodontitis, unspecified: Secondary | ICD-10-CM

## 2019-11-14 DIAGNOSIS — K036 Deposits [accretions] on teeth: Secondary | ICD-10-CM

## 2019-11-14 DIAGNOSIS — K06010 Localized gingival recession, unspecified: Secondary | ICD-10-CM

## 2019-11-14 MED ORDER — AMOXICILLIN 500 MG PO CAPS: ORAL_CAPSULE | ORAL | 1 refills | Status: DC

## 2019-11-14 NOTE — Progress Notes (Signed)
DENTAL CONSULTATION  Date of Consultation:  11/14/2019 Patient Name:   Gregory [name] Date of Birth:   17-Jan-1951 Medical Record Number: [id number]  COVID 19 SCREENING: The patient does not symptoms concerning for COVID-19 infection (Including fever, chills, cough, or new SHORTNESS OF BREATH).    VITALS: BP (!) 146/66 (BP Location: Right Arm)   Pulse (!) 50   Temp 98.5 F (36.9 C)   CHIEF COMPLAINT: Patient referred by Dr. Roxy Manns for dental consultation.  HPI: Gregory [name] is a 68 year old male recently diagnosed with severe aortic stenosis and coronary artery disease.  Patient with anticipated aortic valve replacement and coronary artery bypass graft procedure with Dr. Roxy Manns.  Patient is now seen as part of a medically necessary preheart valve surgery dental protocol examination to rule out dental infection that may affect the patient's systemic health and anticipated heart valve surgery.  The patient currently denies having acute toothaches, swellings, or abscesses.  Patient was last seen at [facility] to have an exam and x-rays approximately 18 months ago.  Patient did not follow-up for dental treatment at that time.  Patient had been treatment plan for periodontal therapy and multiple dental restorations. The patient does not have any partial dentures.  Patient denies having dental phobia.  PROBLEM LIST: Patient Active Problem List   Diagnosis Date Noted  . Severe aortic stenosis     Priority: High  . PAD (peripheral artery disease) ([place])   . Mild persistent asthma 10/29/2019  . Herpes zoster without complication [id number]  . 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 Peripheral neuropathy   . S/P lumbar spinal fusion 10/25/2014  . Recovering alcoholic in remission ([place]) 08/10/2014  . Substance induced mood disorder ([place]) 06/30/2014  . Substance-induced sleep disorder ([place]) 06/30/2014  . Obsessive compulsive disorder   . OSA (obstructive  sleep apnea) 01/20/2014  . CAD (coronary artery disease)   . Mentally disabled 11/25/2012  . Hyperlipidemia with target LDL less than 130 11/25/2012  . Bipolar disorder ([place])   . Hypertension   . Allergic rhinitis due to pollen 08/11/2011  . Tobacco abuse     PMH: Past Medical History:  Diagnosis Date  . Alcohol abuse   . Allergy   . Aortic stenosis   . Asthma   . Bipolar disorder ([place])   . CAD (coronary artery disease)    coronary calcifications 08/2013 CTA  . Carotid artery occlusion   . Chronic back pain   . Claudication ([place])   . Family history of skin cancer   . Heart murmur   . Hyperlipidemia   . Hypertension   . Neuromuscular disorder ([place])   . Obsessive compulsive disorder   . PAD (peripheral artery disease) ([place])   . Peripheral neuropathy   . Pneumonia   . RBBB   . 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 Severe aortic stenosis   . Sleep apnea    does not wear CPAP  . Smoker   . Spinal stenosis at L4-L5 level   . Tobacco abuse   . Tobacco use disorder     PSH: Past Surgical History:  Procedure Laterality Date  . ANAL FISTULECTOMY  09/25/11  . COLONOSCOPY  2009  . ELBOW SURGERY     bilaterally for cubital tunnel  . MAXIMUM ACCESS (MAS)POSTERIOR LUMBAR INTERBODY FUSION (PLIF) 1 LEVEL N/A 10/25/2014   Procedure: LUMBAR FOUR TO FIVE MAXIMUM ACCESS (MAS) POSTERIOR LUMBAR INTERBODY FUSION (  PLIF) 1 LEVEL;  Surgeon: Eustace Moore, MD;  Location: [facility];  Service: Neurosurgery;  Laterality: N/A;  . RIGHT/LEFT HEART CATH AND CORONARY ANGIOGRAPHY N/A 11/10/2019   Procedure: RIGHT/LEFT HEART CATH AND CORONARY ANGIOGRAPHY;  Surgeon: [name], [name], MD;  Location: [facility];  Service: Cardiovascular;  Laterality: N/A;  . 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 TONSILLECTOMY  age 23  . VASECTOMY     X 2  . VASECTOMY REVERSAL      ALLERGIES: Allergies   Allergen Reactions  . Codeine Anaphylaxis    MEDICATIONS: Current Outpatient Medications  Medication Sig Dispense Refill  . albuterol (PROVENTIL HFA;VENTOLIN HFA) 108 (90 Base) MCG/ACT inhaler Inhale 2 puffs into the lungs every 6 (six) hours as needed for wheezing. 3 Inhaler 1  . amLODipine (NORVASC) 10 MG tablet TAKE ONE TABLET BY MOUTH DAILY 90 tablet 1  . aspirin EC 81 MG tablet Take 1 tablet (81 mg total) by mouth daily. 30 tablet 0  . atorvastatin (LIPITOR) 80 MG tablet Take 1 tablet (80 mg total) by mouth daily. 90 tablet 3  . divalproex (DEPAKOTE) 500 MG DR tablet Take 4 tablets (2,000 mg total) by mouth at bedtime. (Patient taking differently: Take 1,000 mg by mouth at bedtime. ) 480 tablet 3  . finasteride (PROSCAR) 5 MG tablet TAKE ONE TABLET BY MOUTH DAILY 90 tablet 2  . fluticasone (FLONASE) 50 MCG/ACT nasal spray SPRAY TWO SPRAYS IN THE AFFECTED NOSTRIL DAILY 16 g 11  . Fluticasone-Salmeterol (ADVAIR DISKUS) 500-50 MCG/DOSE AEPB Inhale 1 puff into the lungs every 12 (twelve) hours. 180 each 3  . ibuprofen (ADVIL) 200 MG tablet Take 400-800 mg by mouth every 8 (eight) hours as needed for moderate pain.     [name] lamoTRIgine (LAMICTAL) 200 MG tablet Take 1 tablet (200 mg total) by mouth every morning. 90 tablet 3  . lisinopril-hydrochlorothiazide (ZESTORETIC) 20-12.5 MG tablet TAKE ONE TABLET BY MOUTH EVERY MORNING 90 tablet 2  . metoprolol tartrate (LOPRESSOR) 25 MG tablet TAKE ONE-HALF TABLET BY MOUTH DAILY (Patient taking differently: Take 12.5 mg by mouth daily. ) 45 tablet 2  . Multiple Vitamins-Minerals (MULTIVITAMIN ADULTS 50+ PO) Take 1 tablet by mouth daily.     . nortriptyline (PAMELOR) 10 MG capsule TAKE ONE CAPSULE BY MOUTH EVERY NIGHT AT BEDTIME 30 capsule 1   Current Facility-Administered Medications  Medication Dose Route Frequency Provider Last Rate Last Dose  . sodium chloride flush (NS) 0.9 % injection 3 mL  3 mL Intravenous Q12H [name], [name], MD         LABS: Lab Results  Component Value Date   WBC 6.1 11/02/2019   HGB 13.3 11/10/2019   HCT 39.0 11/10/2019   MCV 90 11/02/2019   PLT 177 11/02/2019      Component Value Date/Time   NA 140 11/10/2019 0745   NA 137 11/02/2019 1000   K 3.8 11/10/2019 0745   CL 99 11/02/2019 1000   CO2 21 11/02/2019 1000   GLUCOSE 88 11/02/2019 1000   GLUCOSE 107 (H) 06/29/2017 1534   BUN 15 11/02/2019 1000   CREATININE 0.88 11/02/2019 1000   CREATININE 0.77 06/29/2017 1534   CALCIUM 10.0 11/02/2019 1000   GFRNONAA 88 11/02/2019 1000   GFRAA 101 11/02/2019 1000   Lab Results  Component Value Date   INR 1.0 11/02/2019   INR 1.01 10/17/2014   INR 1.00 08/29/2013  No results found for: PTT  SOCIAL HISTORY: Social History   Socioeconomic History  . Marital status: Married    Spouse name: Not on file  . Number of children: 6  . Years of education: Not on file  . Highest education level: Not on file  Occupational History  . Not on file  Social Needs  . Financial resource strain: Not on file  . Food insecurity    Worry: Not on file    Inability: Not on file  . Transportation needs    Medical: Not on file    Non-medical: Not on file  Tobacco Use  . Smoking status: Current Every Day Smoker    Packs/day: 1.00    Years: 15.00    Pack years: 15.00    Types: Cigarettes  . Smokeless tobacco: Never Used  . Tobacco comment: Pt had stopped smoking X10 years/ currently smoking a pack a day   Substance and Sexual Activity  . Alcohol use: Yes    Alcohol/week: 78.0 standard drinks    Types: 50 Glasses of wine, 28 Standard drinks or equivalent per week    Comment: None for the past 5 years. 11/14/19  . Drug use: No  . Sexual activity: Yes  Lifestyle  . Physical activity    Days per week: Not on file    Minutes per session: Not on file  . Stress: Not on file  Relationships  . Social Herbalist on phone: Not on file    Gets together: Not on file    Attends religious  service: Not on file    Active member of club or organization: Not on file    Attends meetings of clubs or organizations: Not on file    Relationship status: Not on file  . Intimate partner violence    Fear of current or ex partner: Not on file    Emotionally abused: Not on file    Physically abused: Not on file    Forced sexual activity: Not on file  Other Topics Concern  . Not on file  Social History Narrative   Lives in [place] with wife.  Does not routinely exercise.  Sedentary r/t chronic lbp.     FAMILY HISTORY: Family History  Problem Relation Age of Onset  . Stroke Father 67  . Cancer Father        skin  . Drug abuse Father   . Hypertension Brother   . Diabetes Brother   . Drug abuse Brother   . Heart disease Brother        s/p CABG, pacemaker  . Heart failure Mother   . Cancer Maternal Aunt        skin  . Cancer Maternal Grandmother        liver    REVIEW OF SYSTEMS: Reviewed with the patient as per History of present illness. Psych: Patient denies having dental phobia.  DENTAL HISTORY: CHIEF COMPLAINT: Patient referred by Dr. Roxy Manns for dental consultation.  HPI: Gregory [name] is a 68 year old male recently diagnosed with severe aortic stenosis and coronary artery disease.  Patient with anticipated aortic valve replacement and coronary artery bypass graft procedure with Dr. Roxy Manns.  Patient is now seen as part of a medically necessary preheart valve surgery dental protocol examination to rule out dental infection that may affect the patient's systemic health and anticipated heart valve surgery.  The patient currently denies having acute toothaches, swellings, or abscesses.  Patient was last seen at  [facility] to have an exam and x-rays approximately 18 months ago.  Patient did not follow-up for dental treatment at that time.  Patient had been treatment plan for periodontal therapy and multiple dental restorations. The patient does not have any partial dentures.   Patient denies having dental phobia.  DENTAL EXAMINATION: GENERAL: The patient is a well-developed, well-nourished male in no acute distress. HEAD AND NECK: There is no palpable neck lymphadenopathy.  The patient denies acute TMJ symptoms. INTRAORAL EXAM: Patient has normal saliva.  I do not see any evidence of oral abscess formation. DENTITION: Patient is missing tooth numbers 1, 2, 16, 17, 19, 31, and 32.  Multiple flexure lesions are noted. PERIODONTAL: The patient has chronic periodontitis with plaque and calculus accumulations, gingival recession, and incipient to moderate bone loss.  The patient has incipient tooth mobility as per dental charting form. DENTAL CARIES/SUBOPTIMAL RESTORATIONS: Patient has multiple dental caries as per dental charting form.  Multiple flexure lesions are noted. ENDODONTIC: The patient currently denies acute pulpitis symptoms.  The patient does have periapical pathology and radiolucency associated with apex of tooth #10. CROWN AND BRIDGE: Patient has a PFM crown on tooth #9 and a PFM bridge on tooth numbers 18 through 20. PROSTHODONTIC: There are no partial dentures. OCCLUSION: Patient has a poor occlusal scheme but has a stable occlusion.  RADIOGRAPHIC INTERPRETATION: An orthopantogram was taken today and supplemented with a full series of dental radiographs. There are missing tooth numbers 1, 2, 16, 17, 19, 31, and 32.  There is incipient to moderate bone loss.  Radiographic calculus is noted.  Dental caries are noted.  There is a periapical radiolucency noted at the apex of tooth #10.  ASSESSMENTS: 1.  Severe aortic stenosis 2.  Coronary artery disease 3.  Preheart valve surgery dental protocol 4.  Chronic periodontitis with bone loss 5.  Gingival recession 7.  Accretions 8.  Tooth mobility 9.  Dental caries 10.  Multiple flexure lesions  11. Multiple missing teeth 12.  Poor occlusal scheme but a stable occlusion 13.  Questionable need for antibiotic  premedication prior to invasive dental procedures due to anticipated heart valve surgery.   PLAN/RECOMMENDATIONS: 1. I discussed the risks, benefits, and complications of various treatment options with the patient in relationship to his medical and dental conditions, anticipated heart valve surgery, and risk for infection and endocarditis. We discussed various treatment options to include no treatment, extractions with alveoloplasty, pre-prosthetic surgery as indicated, periodontal therapy, dental restorations, root canal therapy, crown and bridge therapy, implant therapy, and replacement of missing teeth as indicated.  We also discussed referral to an endodontist for evaluation of need for root canal therapy on tooth #10. The patient currently wishes to proceed with referral to Dr. Eudelia Bunch for evaluation for root canal therapy on tooth #10 with definitive resin restoration.  After discussion with Dr. Roxy Manns, the patient is cleared for root canal therapy with the endodontist and with the use of antibiotic premedication prior to invasive dental procedure due to the proximity to the anticipated heart valve surgery.  The patient will then be cleared for heart valve surgery on 11/24/2019 barring any complications with the endodontic procedure.  Patient will then follow-up with a general dentist of his choice for periodontal maintenance procedures, dental restorations, crown or bridge restorations once he is medically stable from the anticipated heart valve surgery and coronary artery bypass graft procedure.    2. Discussion of findings with medical team and coordination of future medical and  dental care as needed.  I spent in excess of  120 minutes during the conduct of this consultation and >50% of this time involved direct face-to-face encounter for counseling and/or coordination of the patient's care.    Lenn Cal, DDS

## 2019-11-14 NOTE — Patient Instructions (Signed)
[facility]    Department of Dental Medicine     DR. Abdoulaye Drum      HEART VALVES AND MOUTH CARE:  FACTS:   If you have any infection in your mouth, it can infect your heart valve.  If you heart valve is infected, you will be seriously ill.  Infections in the mouth can be SILENT and do not always cause pain.  Examples of infections in the mouth are gum disease, dental cavities, and abscesses.  Some possible signs of infection are: Bad breath, bleeding gums, or teeth that are sensitive to sweets, hot, and/or cold. There are many other signs as well.  WHAT YOU HAVE TO DO:   Brush your teeth after meals and at bedtime. Spend at least 2 minutes brushing well, especially behind your back teeth and all around your teeth that stand alone. Brush at the gumline also.  Do not go to bed without brushing your teeth and flossing.  If you gums bleed when you brush or floss, do NOT stop brushing or flossing. It usually means that your gums need more attention and better cleaning.   If your Dentist or Dr. Cassady Stanczak gave you a prescription mouthwash to use, make sure to use it as directed. If you run out of the medication, get a refill at the pharmacy.   If you were given any other medications or directions by your Dentist, please follow them. If you did not understand the directions or forget what you were told, please call. We will be happy to refresh her memory.  If you need antibiotics before dental procedures, make sure you take them one hour prior to every dental visit as directed.   Get a dental checkup every 4-6 months in order to keep your mouth healthy, or to find and treat any new infection. You will most likely need your teeth cleaned or gums treated at the same time.  If you are not able to come in for your scheduled appointment, call your Dentist as soon as possible to reschedule.  If you have a problem in between dental visits, call your Dentist.  

## 2019-11-16 ENCOUNTER — Ambulatory Visit: Payer: PPO | Admitting: Cardiovascular Disease

## 2019-11-16 ENCOUNTER — Other Ambulatory Visit: Payer: Self-pay | Admitting: *Deleted

## 2019-11-16 ENCOUNTER — Telehealth (HOSPITAL_COMMUNITY): Payer: Self-pay | Admitting: Emergency Medicine

## 2019-11-16 ENCOUNTER — Encounter (HOSPITAL_COMMUNITY): Payer: Self-pay

## 2019-11-16 DIAGNOSIS — I2510 Atherosclerotic heart disease of native coronary artery without angina pectoris: Secondary | ICD-10-CM

## 2019-11-16 DIAGNOSIS — I350 Nonrheumatic aortic (valve) stenosis: Secondary | ICD-10-CM

## 2019-11-16 NOTE — Telephone Encounter (Signed)
Left message on voicemail with name and callback number Heba Ige RN Navigator Cardiac [facility] [phone] Office [phone] Cell  

## 2019-11-18 ENCOUNTER — Ambulatory Visit (HOSPITAL_COMMUNITY)
Admission: RE | Admit: 2019-11-18 | Discharge: 2019-11-18 | Disposition: A | Discharge status: Home / Self Care | Payer: PPO | Source: Ambulatory Visit | Attending: Thoracic Surgery (Cardiothoracic Vascular Surgery) | Admitting: Thoracic Surgery (Cardiothoracic Vascular Surgery)

## 2019-11-18 ENCOUNTER — Other Ambulatory Visit: Payer: Self-pay

## 2019-11-18 DIAGNOSIS — I350 Nonrheumatic aortic (valve) stenosis: Secondary | ICD-10-CM

## 2019-11-18 DIAGNOSIS — Z01818 Encounter for other preprocedural examination: Secondary | ICD-10-CM | POA: Insufficient documentation

## 2019-11-18 MED ORDER — IOHEXOL 350 MG/ML SOLN
100.0000 mL | Freq: Once | INTRAVENOUS | Status: AC | PRN
  Administered 2019-11-18: 100 mL via INTRAVENOUS

## 2019-11-21 ENCOUNTER — Other Ambulatory Visit: Payer: Self-pay

## 2019-11-21 ENCOUNTER — Institutional Professional Consult (permissible substitution): Payer: PPO | Admitting: Surgery

## 2019-11-21 ENCOUNTER — Encounter: Payer: PPO | Admitting: Thoracic Surgery (Cardiothoracic Vascular Surgery)

## 2019-11-21 VITALS — BP 147/71 | HR 63 | Temp 98.1°F | Resp 20 | Ht 65.5 in | Wt 146.6 lb

## 2019-11-21 DIAGNOSIS — I350 Nonrheumatic aortic (valve) stenosis: Secondary | ICD-10-CM

## 2019-11-21 NOTE — Pre-Procedure Instructions (Signed)
Gregory [name]  11/21/2019      [facility]IM[facility] (MAIL ORDER) ELECTRONIC - Shaune Leeks, [place] [address] [place] [address] Phone: [phone] Fax: [phone]  Ammie Ferrier [address] [facility] [address] [place] [address] Phone: [phone] Fax: [phone]    Your procedure is scheduled on Dec.3  Report to [facility] at 5:30 A.M.  Call this number if you have problems the morning of surgery:  [phone]   Remember:  Do not eat or drink after midnight.      Take these medicines the morning of surgery with A SIP OF WATER :              ALBUTEROL INHALER-BRING TO HOSPITAL             AMOLDIPINE (NORVASC)             ATORVASTATIN (LIPITOR)             FINASTERIDE (PROSCAR)             FLONASE NASAL SPRAY             ADVAIR-BRING TO HOSPITAL             LAMOTRIGINE (LAMICTAL)             METOPROLOL TARTRATE (LOPRESSOR)               7 days prior to surgery STOP taking Aleve, Naproxen, Ibuprofen, Motrin, Advil, Goody's, BC's, all herbal medications, fish oil, and all vitamins.    Do not wear jewelry.  Do not wear lotions, powders, or perfumes, or deodorant.  Do not shave 48 hours prior to surgery.  Men may shave face and neck.  Do not bring valuables to the hospital.  [facility] is not responsible for any belongings or valuables.  Contacts, dentures or bridgework may not be worn into surgery.  Leave your suitcase in the car.  After surgery it may be brought to your room.  For patients admitted to the hospital, discharge time will be determined by your treatment team.  Patients discharged the day of surgery will not be allowed to drive home.    Special instructions:  [place] [facility]- Preparing For Surgery  Before surgery, you can play an important role. Because skin is not sterile, your skin needs to be as free of germs as possible. You can reduce the number of germs  on your skin by washing with CHG (chlorahexidine gluconate) Soap before surgery.  CHG is an antiseptic cleaner which kills germs and bonds with the skin to continue killing germs even after washing.    Oral Hygiene is also important to reduce your risk of infection.  Remember - BRUSH YOUR TEETH THE MORNING OF SURGERY WITH YOUR REGULAR TOOTHPASTE  Please do not use if you have an allergy to CHG or antibacterial soaps. If your skin becomes reddened/irritated stop using the CHG.  Do not shave (including legs and underarms) for at least 48 hours prior to first CHG shower. It is OK to shave your face.  Please follow these instructions carefully.   1. Shower the NIGHT BEFORE SURGERY and the MORNING OF SURGERY with CHG.   2. If you chose to wash your hair, wash your hair first as usual with your normal shampoo.  3. After you shampoo, rinse your hair and body thoroughly to remove the shampoo.  4. Use CHG as you  would any other liquid soap. You can apply CHG directly to the skin and wash gently with a scrungie or a clean washcloth.   5. Apply the CHG Soap to your body ONLY FROM THE NECK DOWN.  Do not use on open wounds or open sores. Avoid contact with your eyes, ears, mouth and genitals (private parts). Wash Face and genitals (private parts)  with your normal soap.  6. Wash thoroughly, paying special attention to the area where your surgery will be performed.  7. Thoroughly rinse your body with warm water from the neck down.  8. DO NOT shower/wash with your normal soap after using and rinsing off the CHG Soap.  9. Pat yourself dry with a CLEAN TOWEL.  10. Wear CLEAN PAJAMAS to bed the night before surgery, wear comfortable clothes the morning of surgery  11. Place CLEAN SHEETS on your bed the night of your first shower and DO NOT SLEEP WITH PETS.    Day of Surgery:  Do not apply any deodorants/lotions.  Please wear clean clothes to the hospital/surgery center.   Remember to brush your  teeth WITH YOUR REGULAR TOOTHPASTE.    Please read over the following fact sheets that you were given. Coughing and Deep Breathing, MRSA Information and Surgical Site Infection Prevention

## 2019-11-22 ENCOUNTER — Encounter (HOSPITAL_COMMUNITY): Payer: Self-pay

## 2019-11-22 ENCOUNTER — Encounter (HOSPITAL_COMMUNITY)
Admission: RE | Admit: 2019-11-22 | Discharge: 2019-11-22 | Disposition: A | Discharge status: Home / Self Care | Payer: PPO | Source: Ambulatory Visit | Attending: Surgery | Admitting: Surgery

## 2019-11-22 ENCOUNTER — Encounter: Payer: Self-pay | Admitting: Surgery

## 2019-11-22 ENCOUNTER — Other Ambulatory Visit (HOSPITAL_COMMUNITY)
Admission: RE | Admit: 2019-11-22 | Discharge: 2019-11-22 | Disposition: A | Discharge status: Home / Self Care | Payer: PPO | Source: Ambulatory Visit

## 2019-11-22 ENCOUNTER — Other Ambulatory Visit: Payer: Self-pay

## 2019-11-22 ENCOUNTER — Ambulatory Visit (HOSPITAL_COMMUNITY)
Admission: RE | Admit: 2019-11-22 | Discharge: 2019-11-22 | Disposition: A | Discharge status: Home / Self Care | Payer: PPO | Source: Ambulatory Visit | Attending: Thoracic Surgery (Cardiothoracic Vascular Surgery) | Admitting: Thoracic Surgery (Cardiothoracic Vascular Surgery)

## 2019-11-22 ENCOUNTER — Ambulatory Visit (HOSPITAL_BASED_OUTPATIENT_CLINIC_OR_DEPARTMENT_OTHER)
Admission: RE | Admit: 2019-11-22 | Discharge: 2019-11-22 | Disposition: A | Discharge status: Home / Self Care | Payer: PPO | Source: Ambulatory Visit | Attending: Thoracic Surgery (Cardiothoracic Vascular Surgery) | Admitting: Thoracic Surgery (Cardiothoracic Vascular Surgery)

## 2019-11-22 DIAGNOSIS — J90 Pleural effusion, not elsewhere classified: Secondary | ICD-10-CM | POA: Diagnosis not present

## 2019-11-22 DIAGNOSIS — G8929 Other chronic pain: Secondary | ICD-10-CM | POA: Diagnosis present

## 2019-11-22 DIAGNOSIS — J189 Pneumonia, unspecified organism: Secondary | ICD-10-CM | POA: Diagnosis not present

## 2019-11-22 DIAGNOSIS — I2510 Atherosclerotic heart disease of native coronary artery without angina pectoris: Secondary | ICD-10-CM

## 2019-11-22 DIAGNOSIS — R001 Bradycardia, unspecified: Secondary | ICD-10-CM | POA: Diagnosis present

## 2019-11-22 DIAGNOSIS — M549 Dorsalgia, unspecified: Secondary | ICD-10-CM | POA: Diagnosis present

## 2019-11-22 DIAGNOSIS — I452 Bifascicular block: Secondary | ICD-10-CM | POA: Diagnosis present

## 2019-11-22 DIAGNOSIS — Z823 Family history of stroke: Secondary | ICD-10-CM | POA: Diagnosis not present

## 2019-11-22 DIAGNOSIS — I350 Nonrheumatic aortic (valve) stenosis: Secondary | ICD-10-CM

## 2019-11-22 DIAGNOSIS — Z9889 Other specified postprocedural states: Secondary | ICD-10-CM | POA: Diagnosis not present

## 2019-11-22 DIAGNOSIS — J449 Chronic obstructive pulmonary disease, unspecified: Secondary | ICD-10-CM | POA: Insufficient documentation

## 2019-11-22 DIAGNOSIS — G4733 Obstructive sleep apnea (adult) (pediatric): Secondary | ICD-10-CM | POA: Diagnosis present

## 2019-11-22 DIAGNOSIS — Z20828 Contact with and (suspected) exposure to other viral communicable diseases: Secondary | ICD-10-CM | POA: Diagnosis present

## 2019-11-22 DIAGNOSIS — I442 Atrioventricular block, complete: Secondary | ICD-10-CM | POA: Diagnosis not present

## 2019-11-22 DIAGNOSIS — Z79899 Other long term (current) drug therapy: Secondary | ICD-10-CM | POA: Diagnosis not present

## 2019-11-22 DIAGNOSIS — Z791 Long term (current) use of non-steroidal anti-inflammatories (NSAID): Secondary | ICD-10-CM | POA: Diagnosis not present

## 2019-11-22 DIAGNOSIS — J1289 Other viral pneumonia: Secondary | ICD-10-CM | POA: Diagnosis not present

## 2019-11-22 DIAGNOSIS — E785 Hyperlipidemia, unspecified: Secondary | ICD-10-CM | POA: Diagnosis present

## 2019-11-22 DIAGNOSIS — I10 Essential (primary) hypertension: Secondary | ICD-10-CM | POA: Diagnosis present

## 2019-11-22 DIAGNOSIS — Z833 Family history of diabetes mellitus: Secondary | ICD-10-CM | POA: Diagnosis not present

## 2019-11-22 DIAGNOSIS — Z952 Presence of prosthetic heart valve: Secondary | ICD-10-CM | POA: Diagnosis not present

## 2019-11-22 DIAGNOSIS — Z809 Family history of malignant neoplasm, unspecified: Secondary | ICD-10-CM | POA: Diagnosis not present

## 2019-11-22 DIAGNOSIS — D696 Thrombocytopenia, unspecified: Secondary | ICD-10-CM | POA: Diagnosis present

## 2019-11-22 DIAGNOSIS — Z01818 Encounter for other preprocedural examination: Secondary | ICD-10-CM | POA: Diagnosis not present

## 2019-11-22 DIAGNOSIS — Z953 Presence of xenogenic heart valve: Secondary | ICD-10-CM | POA: Diagnosis not present

## 2019-11-22 DIAGNOSIS — J9811 Atelectasis: Secondary | ICD-10-CM | POA: Diagnosis not present

## 2019-11-22 DIAGNOSIS — F1021 Alcohol dependence, in remission: Secondary | ICD-10-CM | POA: Diagnosis present

## 2019-11-22 DIAGNOSIS — I739 Peripheral vascular disease, unspecified: Secondary | ICD-10-CM | POA: Diagnosis present

## 2019-11-22 DIAGNOSIS — F17200 Nicotine dependence, unspecified, uncomplicated: Secondary | ICD-10-CM | POA: Insufficient documentation

## 2019-11-22 DIAGNOSIS — F17210 Nicotine dependence, cigarettes, uncomplicated: Secondary | ICD-10-CM | POA: Diagnosis present

## 2019-11-22 DIAGNOSIS — I4510 Unspecified right bundle-branch block: Secondary | ICD-10-CM | POA: Diagnosis not present

## 2019-11-22 DIAGNOSIS — Z813 Family history of other psychoactive substance abuse and dependence: Secondary | ICD-10-CM | POA: Diagnosis not present

## 2019-11-22 DIAGNOSIS — Z885 Allergy status to narcotic agent status: Secondary | ICD-10-CM | POA: Diagnosis not present

## 2019-11-22 DIAGNOSIS — Z7982 Long term (current) use of aspirin: Secondary | ICD-10-CM | POA: Diagnosis not present

## 2019-11-22 DIAGNOSIS — Z951 Presence of aortocoronary bypass graft: Secondary | ICD-10-CM | POA: Diagnosis not present

## 2019-11-22 DIAGNOSIS — Z8249 Family history of ischemic heart disease and other diseases of the circulatory system: Secondary | ICD-10-CM | POA: Diagnosis not present

## 2019-11-22 DIAGNOSIS — F319 Bipolar disorder, unspecified: Secondary | ICD-10-CM | POA: Diagnosis present

## 2019-11-22 HISTORY — DX: Inflammatory liver disease, unspecified: K75.9

## 2019-11-22 HISTORY — DX: Depression, unspecified: F32.A

## 2019-11-22 HISTORY — DX: Unspecified osteoarthritis, unspecified site: M19.90

## 2019-11-22 LAB — SURGICAL PCR SCREEN
MRSA, PCR: NEGATIVE
Staphylococcus aureus: NEGATIVE

## 2019-11-22 LAB — CBC
HCT: 42.6 % (ref 39.0–52.0)
Hemoglobin: 14.4 g/dL (ref 13.0–17.0)
MCH: 31.2 pg (ref 26.0–34.0)
MCHC: 33.8 g/dL (ref 30.0–36.0)
MCV: 92.4 fL (ref 80.0–100.0)
Platelets: 165 10*3/uL (ref 150–400)
RBC: 4.61 MIL/uL (ref 4.22–5.81)
RDW: 14.2 % (ref 11.5–15.5)
WBC: 6.6 10*3/uL (ref 4.0–10.5)
nRBC: 0 % (ref 0.0–0.2)

## 2019-11-22 LAB — COMPREHENSIVE METABOLIC PANEL
ALT: 38 U/L (ref 0–44)
AST: 30 U/L (ref 15–41)
Albumin: 3.9 g/dL (ref 3.5–5.0)
Alkaline Phosphatase: 48 U/L (ref 38–126)
Anion gap: 16 — ABNORMAL HIGH (ref 5–15)
BUN: 12 mg/dL (ref 8–23)
CO2: 20 mmol/L — ABNORMAL LOW (ref 22–32)
Calcium: 9.4 mg/dL (ref 8.9–10.3)
Chloride: 104 mmol/L (ref 98–111)
Creatinine, Ser: 0.87 mg/dL (ref 0.61–1.24)
GFR calc Af Amer: >60 mL/min (ref >60)
GFR calc non Af Amer: >60 mL/min (ref >60)
Glucose, Bld: 90 mg/dL (ref 70–99)
Potassium: 4.4 mmol/L (ref 3.5–5.1)
Sodium: 140 mmol/L (ref 135–145)
Total Bilirubin: 0.6 mg/dL (ref 0.3–1.2)
Total Protein: 7.1 g/dL (ref 6.5–8.1)

## 2019-11-22 LAB — HEMOGLOBIN A1C
Hgb A1c MFr Bld: 5.9 % — ABNORMAL HIGH (ref 4.8–5.6)
Mean Plasma Glucose: 122.63 mg/dL

## 2019-11-22 LAB — URINALYSIS, ROUTINE W REFLEX MICROSCOPIC
Bilirubin Urine: NEGATIVE
Glucose, UA: NEGATIVE mg/dL
Hgb urine dipstick: NEGATIVE
Ketones, ur: 5 mg/dL — AB
Leukocytes,Ua: NEGATIVE
Nitrite: NEGATIVE
Protein, ur: NEGATIVE mg/dL
Specific Gravity, Urine: 1.017 (ref 1.005–1.030)
pH: 6 (ref 5.0–8.0)

## 2019-11-22 LAB — BLOOD GAS, ARTERIAL
Acid-Base Excess: 2.7 mmol/L — ABNORMAL HIGH (ref 0.0–2.0)
Bicarbonate: 26.9 mmol/L (ref 20.0–28.0)
Drawn by: 421801
FIO2: 21
O2 Saturation: 96.6 %
Patient temperature: 37
pCO2 arterial: 42.8 mmHg (ref 32.0–48.0)
pH, Arterial: 7.415 (ref 7.350–7.450)
pO2, Arterial: 87.8 mmHg (ref 83.0–108.0)

## 2019-11-22 LAB — PROTIME-INR
INR: 1 (ref 0.8–1.2)
Prothrombin Time: 12.7 seconds (ref 11.4–15.2)

## 2019-11-22 LAB — TYPE AND SCREEN
ABO/RH(D): AB POS
Antibody Screen: NEGATIVE

## 2019-11-22 LAB — SARS CORONAVIRUS 2 (TAT 6-24 HRS): SARS Coronavirus 2: NEGATIVE

## 2019-11-22 LAB — APTT: aPTT: 38 seconds — ABNORMAL HIGH (ref 24–36)

## 2019-11-22 NOTE — Progress Notes (Signed)
Cardiothoracic Surgery Consultation  PCP is [name], MD Referring Provider is [name], MD  Chief Complaint  Patient presents with  . Coronary Artery Disease    Consult-surgery scheduled 11/24/19    HPI:  The patient is a 68 year old gentleman with history of hypertension, hyperlipidemia, coronary artery disease, peripheral vascular disease, spinal stenosis with chronic back pain and lower extremity pain with limited mobility, bipolar disorder and is a recovering alcoholic, chronic right bundle branch block and left anterior fascicular block, and aortic stenosis who has been followed by Dr. [name] and his PCP.  Previous echocardiograms have documented moderate aortic stenosis with normal left ventricular systolic function.  He had a CT scan of the chest in 2014 that showed extensive coronary calcification and a stress Myoview exam at that time showed a small area of apical ischemia.  He was asymptomatic and treated medically.  A recent echocardiogram showed significant progression of his aortic stenosis with a mean gradient of 50 mmHg and a peak gradient of 89 mmHg.  Aortic valve area was 0.84 cm.  Left ventricular systolic function was normal.  He subsequently underwent cardiac catheterization on November 10, 2019 that showed severe aortic stenosis and critical single-vessel coronary disease with a 99% stenosis of the mid LAD.  The patient was seen by Dr. Roxy Manns on 11/11/2019 and was felt the best treatment for him was open surgical aortic valve replacement and coronary bypass to the LAD with a left internal mammary graft.  The patient was scheduled for surgery by Dr. Roxy Manns who had to take a several week medical leave and could not do the surgery as scheduled.  The patient desired to have it done on the schedule day and requested that I do it.  He is here today with his wife whom he lives with in [place].  He has retired.  He has a sedentary lifestyle and has been limited due to  chronic back pain from spinal stenosis.  He has also had pain in both lower legs with ambulation suggesting claudication.  He denies any exertional chest pain or shortness of breath.  He denies resting shortness of breath, orthopnea, PND, and lower extremity edema.  He has had no dizziness or syncope. Past Medical History:  Diagnosis Date  . Alcohol abuse   . Allergy   . Aortic stenosis   . Arthritis   . Asthma   . Bipolar disorder ([place])   . CAD (coronary artery disease)    coronary calcifications 08/2013 CTA  . Carotid artery occlusion   . Chronic back pain   . Claudication ([place])   . Depression   . Family history of skin cancer   . Heart murmur   . Hepatitis    h/o 1970,doesn't remember which type,1990 epstain-barr  . Hyperlipidemia   . Hypertension   . Neuromuscular disorder ([place])   . Obsessive compulsive disorder   . PAD (peripheral artery disease) ([place])   . Peripheral neuropathy   . Pneumonia   . RBBB   . 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 Severe aortic stenosis   . Sleep apnea    does not wear CPAP  . Smoker   . Spinal stenosis at L4-L5 level   . Tobacco abuse   . Tobacco use disorder     Past Surgical History:  Procedure Laterality Date  . ANAL FISTULECTOMY  09/25/11  . COLONOSCOPY  2009  . ELBOW SURGERY  bilaterally for cubital tunnel  . HERNIA REPAIR     with mesh  . MAXIMUM ACCESS (MAS)POSTERIOR LUMBAR INTERBODY FUSION (PLIF) 1 LEVEL N/A 10/25/2014   Procedure: LUMBAR FOUR TO FIVE MAXIMUM ACCESS (MAS) POSTERIOR LUMBAR INTERBODY FUSION (PLIF) 1 LEVEL;  Surgeon: Eustace Moore, MD;  Location: [facility];  Service: Neurosurgery;  Laterality: N/A;  . RIGHT/LEFT HEART CATH AND CORONARY ANGIOGRAPHY N/A 11/10/2019   Procedure: RIGHT/LEFT HEART CATH AND CORONARY ANGIOGRAPHY;  Surgeon: [name], [name], MD;  Location: [facility];  Service: Cardiovascular;  Laterality: N/A;  . SKIN BIOPSY Left 10/12/2018   shave forehead  Hypertrophic actinic kertosis with features of a verruca  . TONSILLECTOMY  age 40  . VASECTOMY     X 2  . VASECTOMY REVERSAL      Family History  Problem Relation Age of Onset  . Stroke Father 82  . Cancer Father        skin  . Drug abuse Father   . Hypertension Brother   . Diabetes Brother   . Drug abuse Brother   . Heart disease Brother        s/p CABG, pacemaker  . Heart failure Mother   . Cancer Maternal Aunt        skin  . Cancer Maternal Grandmother        liver    Social History Social History   Tobacco Use  . Smoking status: Current Every Day Smoker    Packs/day: 1.00    Years: 25.00    Pack years: 25.00    Types: Cigarettes  . Smokeless tobacco: Never Used  . Tobacco comment: Pt had stopped smoking X10 years/ currently smoking a pack a day   Substance Use Topics  . Alcohol use: Yes    Alcohol/week: 78.0 standard drinks    Types: 50 Glasses of wine, 28 Standard drinks or equivalent per week    Comment: None for the past 5 years. 11/14/19  . Drug use: No    Comment: former alcoholic    Current Outpatient Medications  Medication Sig Dispense Refill  . albuterol (PROVENTIL HFA;VENTOLIN HFA) 108 (90 Base) MCG/ACT inhaler Inhale 2 puffs into the lungs every 6 (six) hours as needed for wheezing. 3 Inhaler 1  . amLODipine (NORVASC) 10 MG tablet TAKE ONE TABLET BY MOUTH DAILY 90 tablet 1  . amoxicillin (AMOXIL) 500 MG capsule Take four capsules one hour before dental appointment. 4 capsule 1  . aspirin EC 81 MG tablet Take 1 tablet (81 mg total) by mouth daily. 30 tablet 0  . atorvastatin (LIPITOR) 80 MG tablet Take 1 tablet (80 mg total) by mouth daily. 90 tablet 3  . divalproex (DEPAKOTE) 500 MG DR tablet Take 500 mg by mouth at bedtime. 2-3 tabs    . finasteride (PROSCAR) 5 MG tablet TAKE ONE TABLET BY MOUTH DAILY 90 tablet 2  . fluticasone (FLONASE) 50 MCG/ACT nasal spray SPRAY TWO SPRAYS IN THE AFFECTED NOSTRIL DAILY 16 g 11  . Fluticasone-Salmeterol (ADVAIR  DISKUS) 500-50 MCG/DOSE AEPB Inhale 1 puff into the lungs every 12 (twelve) hours. 180 each 3  . ibuprofen (ADVIL) 200 MG tablet Take 400-800 mg by mouth every 8 (eight) hours as needed for moderate pain.     [name] lamoTRIgine (LAMICTAL) 200 MG tablet Take 1 tablet (200 mg total) by mouth every morning. 90 tablet 3  . lisinopril-hydrochlorothiazide (ZESTORETIC) 20-12.5 MG tablet TAKE ONE TABLET BY MOUTH EVERY MORNING 90 tablet 2  .  metoprolol tartrate (LOPRESSOR) 25 MG tablet TAKE ONE-HALF TABLET BY MOUTH DAILY (Patient taking differently: Take 12.5 mg by mouth daily. ) 45 tablet 2  . Multiple Vitamins-Minerals (MULTIVITAMIN ADULTS 50+ PO) Take 1 tablet by mouth daily.     . nortriptyline (PAMELOR) 10 MG capsule TAKE ONE CAPSULE BY MOUTH EVERY NIGHT AT BEDTIME 30 capsule 1   Current Facility-Administered Medications  Medication Dose Route Frequency Provider Last Rate Last Dose  . sodium chloride flush (NS) 0.9 % injection 3 mL  3 mL Intravenous Q12H [name], [name], MD        Allergies  Allergen Reactions  . Codeine Anaphylaxis    Review of Systems  Constitutional: Negative for activity change and fatigue.  HENT: Negative.        He recently saw Dr. Lawana Chambers for dental evaluation and was noted to have an apical lucency on tooth #10.  He subsequently underwent root canal therapy by an endodontist and has been cleared for surgery.  Eyes: Negative.   Respiratory: Positive for cough.   Cardiovascular: Negative for chest pain and leg swelling.  Gastrointestinal: Negative.   Endocrine: Negative.   Genitourinary: Negative.   Musculoskeletal: Positive for arthralgias and back pain.  Skin: Negative.   Neurological: Negative for dizziness.  Hematological: Negative.   Psychiatric/Behavioral: The patient is nervous/anxious.     BP (!) 147/71 (BP Location: Right Arm)   Pulse 63   Temp 98.1 F (36.7 C) (Skin)   Resp 20   Ht 5' 5.5" (1.664 m)   Wt 146 lb 9.6 oz (66.5 kg)   SpO2 97% Comment: RA   BMI 24.02 kg/m  Physical Exam Constitutional:      Appearance: Normal appearance. He is normal weight.  HENT:     Head: Normocephalic and atraumatic.  Eyes:     Extraocular Movements: Extraocular movements intact.     Conjunctiva/sclera: Conjunctivae normal.     Pupils: Pupils are equal, round, and reactive to light.  Neck:     Musculoskeletal: Normal range of motion and neck supple.     Vascular: No carotid bruit.  Cardiovascular:     Rate and Rhythm: Normal rate and regular rhythm.     Heart sounds: Murmur present.     Comments: 3/6 systolic murmur along the right sternal border Pulmonary:     Effort: Pulmonary effort is normal.     Breath sounds: Normal breath sounds. No rhonchi or rales.  Abdominal:     General: Abdomen is flat.     Palpations: Abdomen is soft.  Musculoskeletal: Normal range of motion.        General: No swelling.  Lymphadenopathy:     Cervical: No cervical adenopathy.  Skin:    General: Skin is warm and dry.  Neurological:     General: No focal deficit present.     Mental Status: He is alert and oriented to person, place, and time.  Psychiatric:        Mood and Affect: Mood normal.        Behavior: Behavior normal.        Thought Content: Thought content normal.        Judgment: Judgment normal.      Diagnostic Tests:  ECHOCARDIOGRAM REPORT       Patient Name:   Gregory [name] Date of Exam: 10/06/2019 Medical Rec #:  [id number]          Height:       65.0 in Accession #:  [id number]         Weight:       145.0 lb Date of Birth:  1951/03/23          BSA:          1.73 m Patient Age:    89 years           BP:           144/73 mmHg Patient Gender: M                  HR:           51 bpm. Exam Location:  [facility]  Procedure: 2D Echo, Cardiac Doppler and Color Doppler  Indications:    I35.0 Nonrheumatic aortic (valve) stenosis   History:        Patient has prior history of Echocardiogram examinations, most                  recent 03/05/2016. Arrythmias:RBBB Risk Factors:Current Smoker                 and Hypertension. Obstructive sleep apnea.   Sonographer:    Diamond Nickel RCS Referring Phys: [place]    1. Left ventricular ejection fraction, by visual estimation, is 55 to 60%. The left ventricle has normal function. Normal left ventricular size. Left ventricular septal wall thickness was moderately increased. There is moderately increased left  ventricular hypertrophy.  2. Global right ventricle has normal systolic function.The right ventricular size is normal. No increase in right ventricular wall thickness.  3. Left atrial size was normal.  4. Right atrial size was normal.  5. Mild mitral annular calcification.  6. Moderate calcification of the mitral valve leaflet(s).  7. Moderate thickening of the mitral valve leaflet(s).  8. The mitral valve is normal in structure. Trace mitral valve regurgitation.  9. The tricuspid valve is normal in structure. Tricuspid valve regurgitation is mild. 10. The aortic valve has an indeterminant number of cusps Aortic valve regurgitation is mild by color flow Doppler. Severe aortic valve stenosis. 11. There is Severe calcifcation of the aortic valve. 12. There is Severely thickening of the aortic valve. 13. The pulmonic valve was grossly normal. Pulmonic valve regurgitation is mild by color flow Doppler.  FINDINGS  Left Ventricle: Left ventricular ejection fraction, by visual estimation, is 55 to 60%. The left ventricle has normal function. Left ventricular septal wall thickness was moderately increased. There is moderately increased left ventricular hypertrophy.  Normal left ventricular size.  Right Ventricle: The right ventricular size is normal. No increase in right ventricular wall thickness. Global RV systolic function is has normal systolic function.  Left Atrium: Left atrial size was normal in size.  Right Atrium: Right atrial  size was normal in size  Pericardium: There is no evidence of pericardial effusion.  Mitral Valve: The mitral valve is normal in structure. There is moderate thickening of the mitral valve leaflet(s). There is moderate calcification of the mitral valve leaflet(s). Mild mitral annular calcification. Trace mitral valve regurgitation.  Tricuspid Valve: The tricuspid valve is normal in structure. Tricuspid valve regurgitation is mild by color flow Doppler.  Aortic Valve: The aortic valve has an indeterminant number of cusps. There is Severely thickening and Severe calcifcation of the aortic valve. Aortic valve regurgitation is mild by color flow Doppler. Aortic regurgitation PHT measures 522 msec. Severe  aortic stenosis is present. There is Severely thickening of the aortic valve. Severe calcifcation.  Aortic valve mean gradient measures 50.0 mmHg. Aortic valve peak gradient measures 89.0 mmHg. Aortic valve area, by VTI measures 0.84 cm.  Pulmonic Valve: The pulmonic valve was grossly normal. Pulmonic valve regurgitation is mild by color flow Doppler.  Aorta: The aortic root is normal in size and structure.  IAS/Shunts: No atrial level shunt detected by color flow Doppler.     LEFT VENTRICLE PLAX 2D LVIDd:         4.00 cm  Diastology LVIDs:         2.20 cm  LV e' lateral:   8.49 cm/s LV PW:         1.50 cm  LV E/e' lateral: 9.3 LV IVS:        1.60 cm  LV e' medial:    5.66 cm/s LVOT diam:     2.00 cm  LV E/e' medial:  13.9 LV SV:         54 ml LV SV Index:   30.83 LVOT Area:     3.14 cm    RIGHT VENTRICLE RV Basal diam:  2.40 cm RV S prime:     13.20 cm/s TAPSE (M-mode): 2.4 cm  LEFT ATRIUM             Index       RIGHT ATRIUM           Index LA diam:        3.50 cm 2.03 cm/m  RA Area:     11.20 cm LA Vol (A2C):   44.1 ml 25.56 ml/m RA Volume:   21.90 ml  12.69 ml/m LA Vol (A4C):   35.9 ml 20.81 ml/m LA Biplane Vol: 40.9 ml 23.70 ml/m  AORTIC VALVE AV Area  (Vmax):    0.71 cm AV Area (Vmean):   0.68 cm AV Area (VTI):     0.84 cm AV Vmax:           471.67 cm/s AV Vmean:          328.000 cm/s AV VTI:            1.093 m AV Peak Grad:      89.0 mmHg AV Mean Grad:      50.0 mmHg LVOT Vmax:         106.00 cm/s LVOT Vmean:        71.500 cm/s LVOT VTI:          0.293 m LVOT/AV VTI ratio: 0.27 AI PHT:            522 msec   AORTA Ao Root diam: 2.70 cm  MITRAL VALVE MV Area (PHT): 2.76 cm              SHUNTS MV PHT:        79.75 msec            Systemic VTI:  0.29 m MV Decel Time: 275 msec              Systemic Diam: 2.00 cm MV E velocity: 78.80 cm/s  103 cm/s MV A velocity: 114.00 cm/s 70.3 cm/s MV E/A ratio:  0.69        1.5    Jenkins Rouge MD Electronically signed by Jenkins Rouge MD Signature Date/Time: 10/06/2019/4:42:21 PM   Physicians  Panel Physicians Referring Physician Case Authorizing Physician  [name], [name], MD (Primary)    Procedures  RIGHT/LEFT HEART CATH AND CORONARY ANGIOGRAPHY  Conclusion    Prox LAD to Mid LAD  lesion is 99% stenosed.  Mid Cx to Dist Cx lesion is 50% stenosed.  Mid RCA lesion is 30% stenosed.  The left ventricular systolic function is normal.  LV end diastolic pressure is normal.  The left ventricular ejection fraction is 55-65% by visual estimate.  There is severe aortic valve stenosis.   1. Critical single vessel obstructive CAD with 99% mid LAD - heavily calcified 2. Severe aortic stenosis. Mean gradient 47 mm Hg. AVA 0.68 cm squared with index 0.39. 3. Normal LV filling pressures 4. Normal Right heart pressures 5. Normal cardiac output.  Plan: referral to CT surgery for combined AVR and CABG.    Recommendations  Antiplatelet/Anticoag Recommend Aspirin 81mg  daily for moderate CAD.  Indications  Severe aortic stenosis [I35.0 (ICD-10-CM)]  Procedural Details  Technical Details Indication: 67 yo WM with severe aortic stenosis and severe PAD  Procedural Details:  The right wrist was prepped, draped, and anesthetized with 1% lidocaine. Using the modified Seldinger technique a 6 Fr slender sheath was placed in the right radial artery and a 5 French sheath was placed in the right brachial vein. A Swan-Ganz catheter was used for the right heart catheterization. Standard protocol was followed for recording of right heart pressures and sampling of oxygen saturations. Fick cardiac output was calculated. Standard Judkins catheters were used for selective coronary angiography and left ventriculography. There were no immediate procedural complications. The patient was transferred to the post catheterization recovery area for further monitoring. Contrast: 70 cc Estimated blood loss <50 mL.   During this procedure medications were administered to achieve and maintain moderate conscious sedation while the patient's heart rate, blood pressure, and oxygen saturation were continuously monitored and I was present face-to-face 100% of this time.  Medications (Filter: Administrations occurring from 11/10/19 0715 to 11/10/19 0816) (important)  Continuous medications are totaled by the amount administered until 11/10/19 0816.  Medication Rate/Dose/Volume Action  Date Time   midazolam (VERSED) injection (mg) 1 mg Given 11/10/19 0730   Total dose as of 11/10/19 0816        1 mg        fentaNYL (SUBLIMAZE) injection (mcg) 25 mcg Given 11/10/19 0730   Total dose as of 11/10/19 0816        25 mcg        Heparin (Porcine) in NaCl 1000-0.9 UT/500ML-% SOLN (mL) 500 mL Given 11/10/19 0731   Total dose as of 11/10/19 0816        500 mL        Heparin (Porcine) in NaCl 1000-0.9 UT/500ML-% SOLN (mL) 500 mL Given 11/10/19 0731   Total dose as of 11/10/19 0816        500 mL        lidocaine (PF) (XYLOCAINE) 1 % injection (mL) 2 mL Given 11/10/19 0736   Total dose as of 11/10/19 0816 2 mL Given 0737   4 mL        heparin injection (Units) 3,500 Units Given 11/10/19 0743   Total dose as  of 11/10/19 0816        3,500 Units        Radial Cocktail/Verapamil only (mL) 10 mL Given 11/10/19 0738   Total dose as of 11/10/19 0816        10 mL        iohexol (OMNIPAQUE) 350 MG/ML injection (mL) 70 mL Given 11/10/19 0802   Total dose as of 11/10/19 0816        70 mL  Sedation Time  Sedation Time Physician-1: 36 minutes 8 seconds  Contrast  Medication Name Total Dose  iohexol (OMNIPAQUE) 350 MG/ML injection 70 mL    Radiation/Fluoro  Fluoro time: 7 (min) DAP: [id number] (mGycm2) Cumulative Air Kerma: [id number] (mGy)  Complications  Complications documented before study signed (11/10/2019 [id number] AM)   No complications were associated with this study.  Documented by [name], [name], MD - 11/10/2019 8:14 AM    Coronary Findings  Diagnostic Dominance: Right Left Main  Vessel was injected. Vessel is normal in caliber. Vessel is angiographically normal.  Left Anterior Descending  Collaterals  Mid LAD filled by collaterals from RPDA.    Prox LAD to Mid LAD lesion 99% stenosed  Prox LAD to Mid LAD lesion is 99% stenosed. The lesion is segmental. The lesion is severely calcified.  Left Circumflex  Mid Cx to Dist Cx lesion 50% stenosed  Mid Cx to Dist Cx lesion is 50% stenosed.  Right Coronary Artery  Mid RCA lesion 30% stenosed  Mid RCA lesion is 30% stenosed.  Intervention  No interventions have been documented. Wall Motion  Resting       All segments of the heart are normal.          Left Heart  Left Ventricle The left ventricular size is normal. The left ventricular systolic function is normal. LV end diastolic pressure is normal. The left ventricular ejection fraction is 55-65% by visual estimate. No regional wall motion abnormalities.  Aortic Valve There is severe aortic valve stenosis. The aortic valve is calcified. There is restricted aortic valve motion.  Coronary Diagrams  Diagnostic Dominance: Right  Intervention  Implants   No implant  documentation for this case.  Syngo Images  Show images for CARDIAC CATHETERIZATION  Images on Long Term Storage  Show images for Gregory [name], Gregory [name] to Procedure Log  Procedure Log    Hemo Data   Most Recent Value  Fick Cardiac Output 4.42 L/min  Fick Cardiac Output Index 2.53 (L/min)/BSA  Aortic Mean Gradient 46.8 mmHg  Aortic Peak Gradient 49 mmHg  Aortic Valve Area 0.68  Aortic Value Area Index 0.39 cm2/BSA  RA A Wave 5 mmHg  RA V Wave 2 mmHg  RA Mean 1 mmHg  RV Systolic Pressure 30 mmHg  RV Diastolic Pressure 0 mmHg  RV EDP 1 mmHg  PA Systolic Pressure 32 mmHg  PA Diastolic Pressure 11 mmHg  PA Mean 17 mmHg  PW A Wave 13 mmHg  PW V Wave 9 mmHg  PW Mean 7 mmHg  AO Systolic Pressure 99991111 mmHg  AO Diastolic Pressure 47 mmHg  AO Mean 71 mmHg  LV Systolic Pressure 0000000 mmHg  LV Diastolic Pressure 7 mmHg  LV EDP 26 mmHg  AOp Systolic Pressure 123456 mmHg  AOp Diastolic Pressure 40 mmHg  AOp Mean Pressure 66 mmHg  LVp Systolic Pressure 123XX123 mmHg  LVp Diastolic Pressure 8 mmHg  LVp EDP Pressure 27 mmHg  QP/QS 1  TPVR Index 6.72 HRUI  TSVR Index 28.06 HRUI  PVR SVR Ratio 0.14  TPVR/TSVR Ratio 0.24    Impression:  This 68 year old gentleman has stage C, asymptomatic, severe aortic stenosis with 99% stenosis of the proximal to mid LAD.  I have personally reviewed his 2D echocardiogram, cardiac catheterization, and CTA studies.  His echocardiogram shows a trileaflet aortic valve with severe thickening and calcification with restricted leaflet mobility.  The mean gradient across the aortic valve was measured at 50 mmHg consistent with  severe aortic stenosis.  Left ventricular systolic function is normal.  Cardiac catheterization shows a 99% proximal to mid LAD stenosis over a long segment with otherwise widely patent coronary arteries.  I agree that the best treatment for this gentleman is open surgical aortic valve replacement and coronary artery bypass grafting using  a left internal mammary graft to the LAD.  His coronary stenosis is not favorable for PCI and his aortic valve shows bulky calcification both of which make open surgical treatment the best option.  I would plan to use a bioprosthetic valve given his age.  I reviewed the echocardiogram and cardiac catheterization images with him and his wife and answered all their questions. I discussed the operative procedure with the patient and family including alternatives, benefits and risks; including but not limited to bleeding, blood transfusion, infection, stroke, myocardial infarction, graft failure, heart block requiring a permanent pacemaker, organ dysfunction, and death.  Gregory [name] understands and agrees to proceed.    Plan:  We will plan to proceed with aortic valve replacement and coronary artery bypass graft surgery on Thursday, 11/24/2019.  I spent 30 minutes performing this consultation and > 50% of this time was spent face to face counseling and coordinating the care of this patient's severe, asymptomatic, aortic stenosis and high-grade LAD stenosis.   Gaye Pollack, MD [facility] [phone]

## 2019-11-22 NOTE — Progress Notes (Signed)
Pre-CABG Dopplers completed. Refer to "CV Proc" under chart review to view preliminary results.  11/22/2019 11:12 AM Kelby Aline., MHA, RVT, RDCS, RDMS

## 2019-11-22 NOTE — Progress Notes (Signed)
PCP - [name] Cardiologist - peter [name]  PPM/ICD - na Device Orders - na Rep Notified -   Chest x-ray - 11/22/19 EKG - 11/02/19 Stress Test - 09/30/13 ECHO - 10/06/19 Cardiac Cath - 11/10/19  Sleep Study - 05-06-16 CPAP - doesn't use   Fasting Blood Sugar - na Checks Blood Sugar _____ times a day  Blood Thinner Instructions: Aspirin Instructions:not to take day of surgery  ERAS Protcol - PRE-SURGERY Ensure or G2-   COVID TEST- 11/22/19   Anesthesia review: ekg  Patient denies shortness of breath, fever, cough and chest pain at PAT appointment   All instructions explained to the patient, with a verbal understanding of the material. Patient agrees to go over the instructions while at home for a better understanding. Patient also instructed to self quarantine after being tested for COVID-19. The opportunity to ask questions was provided.

## 2019-11-23 MED ORDER — MAGNESIUM SULFATE 50 % IJ SOLN
40.0000 meq | INTRAMUSCULAR | Status: DC
  Filled 2019-11-23: qty 9.85

## 2019-11-23 MED ORDER — POTASSIUM CHLORIDE 2 MEQ/ML IV SOLN
80.0000 meq | INTRAVENOUS | Status: DC
  Filled 2019-11-23: qty 40

## 2019-11-23 MED ORDER — SODIUM CHLORIDE 0.9 % IV SOLN
INTRAVENOUS | Status: DC
  Filled 2019-11-23: qty 30

## 2019-11-23 MED ORDER — TRANEXAMIC ACID (OHS) BOLUS VIA INFUSION
15.0000 mg/kg | INTRAVENOUS | Status: AC
  Administered 2019-11-24: 994.5 mg via INTRAVENOUS
  Filled 2019-11-23: qty 995

## 2019-11-23 MED ORDER — TRANEXAMIC ACID (OHS) PUMP PRIME SOLUTION
2.0000 mg/kg | INTRAVENOUS | Status: DC
  Filled 2019-11-23: qty 1.33

## 2019-11-23 MED ORDER — TRANEXAMIC ACID 1000 MG/10ML IV SOLN
1.5000 mg/kg/h | INTRAVENOUS | Status: DC
  Filled 2019-11-23: qty 25

## 2019-11-23 MED ORDER — MILRINONE LACTATE IN DEXTROSE 20-5 MG/100ML-% IV SOLN
0.3000 ug/kg/min | INTRAVENOUS | Status: DC
  Filled 2019-11-23: qty 100

## 2019-11-23 MED ORDER — NOREPINEPHRINE 4 MG/250ML-% IV SOLN
0.0000 ug/min | INTRAVENOUS | Status: DC
  Filled 2019-11-23: qty 250

## 2019-11-23 MED ORDER — PLASMA-LYTE 148 IV SOLN
INTRAVENOUS | Status: DC
  Filled 2019-11-23: qty 2.5

## 2019-11-23 MED ORDER — INSULIN REGULAR(HUMAN) IN NACL 100-0.9 UT/100ML-% IV SOLN
INTRAVENOUS | Status: AC
  Administered 2019-11-24: 1 [IU]/h via INTRAVENOUS
  Filled 2019-11-23: qty 100

## 2019-11-23 MED ORDER — EPINEPHRINE HCL 5 MG/250ML IV SOLN IN NS
0.0000 ug/min | INTRAVENOUS | Status: DC
  Filled 2019-11-23: qty 250

## 2019-11-23 MED ORDER — SODIUM CHLORIDE 0.9 % IV SOLN
750.0000 mg | INTRAVENOUS | Status: DC
  Filled 2019-11-23: qty 750

## 2019-11-23 MED ORDER — SODIUM CHLORIDE 0.9 % IV SOLN
1.5000 g | INTRAVENOUS | Status: AC
  Administered 2019-11-24: .75 g via INTRAVENOUS
  Administered 2019-11-24: 1.5 g via INTRAVENOUS
  Filled 2019-11-23 (×2): qty 1.5

## 2019-11-23 MED ORDER — PHENYLEPHRINE HCL-NACL 20-0.9 MG/250ML-% IV SOLN
30.0000 ug/min | INTRAVENOUS | Status: AC
  Administered 2019-11-24: 20 ug/min via INTRAVENOUS
  Filled 2019-11-23: qty 250

## 2019-11-23 MED ORDER — VANCOMYCIN HCL 10 G IV SOLR
1250.0000 mg | INTRAVENOUS | Status: DC
  Filled 2019-11-23: qty 1250

## 2019-11-23 MED ORDER — DEXMEDETOMIDINE HCL IN NACL 400 MCG/100ML IV SOLN
0.1000 ug/kg/h | INTRAVENOUS | Status: DC
  Filled 2019-11-23: qty 100

## 2019-11-23 MED ORDER — NITROGLYCERIN IN D5W 200-5 MCG/ML-% IV SOLN
2.0000 ug/min | INTRAVENOUS | Status: AC
  Administered 2019-11-24: 5 ug/min via INTRAVENOUS
  Filled 2019-11-23: qty 250

## 2019-11-23 NOTE — H&P (Signed)
[address]       [place] [address]             [phone]      Cardiothoracic Surgery Admission History and Physical   PCP is [name], MD  Referring Provider is [name], MD      Chief Complaint  Patient presents with  . Coronary Artery Disease and aortic stenosis.       HPI:  The patient is a 68 year old gentleman with history of hypertension, hyperlipidemia, coronary artery disease, peripheral vascular disease, spinal stenosis with chronic back pain and lower extremity pain with limited mobility, bipolar disorder and is a recovering alcoholic, chronic right bundle branch block and left anterior fascicular block, and aortic stenosis who has been followed by Dr. [name] and his PCP. Previous echocardiograms have documented moderate aortic stenosis with normal left ventricular systolic function. He had a CT scan of the chest in 2014 that showed extensive coronary calcification and a stress Myoview exam at that time showed a small area of apical ischemia. He was asymptomatic and treated medically. A recent echocardiogram showed significant progression of his aortic stenosis with a mean gradient of 50 mmHg and a peak gradient of 89 mmHg. Aortic valve area was 0.84 cm. Left ventricular systolic function was normal. He subsequently underwent cardiac catheterization on November 10, 2019 that showed severe aortic stenosis and critical single-vessel coronary disease with a 99% stenosis of the mid LAD. The patient was seen by Dr. Roxy Manns on 11/11/2019 and was felt the best treatment for him was open surgical aortic valve replacement and coronary bypass to the LAD with a left internal mammary graft. The patient was scheduled for surgery by Dr. Roxy Manns who had to take a several week medical leave and could not do the surgery as scheduled. The patient desired to have it done on the schedule day and requested that I do it.  He lives with his wife in [place]. He has  retired. He has a sedentary lifestyle and has been limited due to chronic back pain from spinal stenosis. He has also had pain in both lower legs with ambulation suggesting claudication. He denies any exertional chest pain or shortness of breath. He denies resting shortness of breath, orthopnea, PND, and lower extremity edema. He has had no dizziness or syncope.      Past Medical History:  Diagnosis Date  . Alcohol abuse   . Allergy   . Aortic stenosis   . Arthritis   . Asthma   . Bipolar disorder ([place])   . CAD (coronary artery disease)    coronary calcifications 08/2013 CTA  . Carotid artery occlusion   . Chronic back pain   . Claudication ([place])   . Depression   . Family history of skin cancer   . Heart murmur   . Hepatitis    h/o 1970,doesn't remember which type,1990 epstain-barr  . Hyperlipidemia   . Hypertension   . Neuromuscular disorder ([place])   . Obsessive compulsive disorder   . PAD (peripheral artery disease) ([place])   . Peripheral neuropathy   . Pneumonia   . RBBB   . 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 Severe aortic stenosis   . Sleep apnea    does not wear CPAP  . Smoker   . Spinal stenosis at L4-L5 level   . Tobacco abuse   . Tobacco use disorder  Past Surgical History:  Procedure Laterality Date  . ANAL FISTULECTOMY  09/25/11  . COLONOSCOPY  2009  . ELBOW SURGERY     bilaterally for cubital tunnel  . HERNIA REPAIR     with mesh  . MAXIMUM ACCESS (MAS)POSTERIOR LUMBAR INTERBODY FUSION (PLIF) 1 LEVEL N/A 10/25/2014   Procedure: LUMBAR FOUR TO FIVE MAXIMUM ACCESS (MAS) POSTERIOR LUMBAR INTERBODY FUSION (PLIF) 1 LEVEL; Surgeon: Eustace Moore, MD; Location: [facility]; Service: Neurosurgery; Laterality: N/A;  . RIGHT/LEFT HEART CATH AND CORONARY ANGIOGRAPHY N/A 11/10/2019   Procedure: RIGHT/LEFT HEART CATH AND CORONARY ANGIOGRAPHY; Surgeon: [name], [name], MD; Location: [facility]; Service: Cardiovascular;  Laterality: N/A;  . 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 TONSILLECTOMY  age 89  . VASECTOMY     X 2  . VASECTOMY REVERSAL          Family History  Problem Relation Age of Onset  . Stroke Father 71  . Cancer Father    skin  . Drug abuse Father   . Hypertension Brother   . Diabetes Brother   . Drug abuse Brother   . Heart disease Brother    s/p CABG, pacemaker  . Heart failure Mother   . Cancer Maternal Aunt    skin  . Cancer Maternal Grandmother    liver   Social History  Social History        Tobacco Use  . Smoking status: Current Every Day Smoker    Packs/day: 1.00    Years: 25.00    Pack years: 25.00    Types: Cigarettes  . Smokeless tobacco: Never Used  . Tobacco comment: Pt had stopped smoking X10 years/ currently smoking a pack a day   Substance Use Topics  . Alcohol use: Yes    Alcohol/week: 78.0 standard drinks    Types: 50 Glasses of wine, 28 Standard drinks or equivalent per week    Comment: None for the past 5 years. 11/14/19  . Drug use: No    Comment: former alcoholic         Current Outpatient Medications  Medication Sig Dispense Refill  . albuterol (PROVENTIL HFA;VENTOLIN HFA) 108 (90 Base) MCG/ACT inhaler Inhale 2 puffs into the lungs every 6 (six) hours as needed for wheezing. 3 Inhaler 1  . amLODipine (NORVASC) 10 MG tablet TAKE ONE TABLET BY MOUTH DAILY 90 tablet 1  . amoxicillin (AMOXIL) 500 MG capsule Take four capsules one hour before dental appointment. 4 capsule 1  . aspirin EC 81 MG tablet Take 1 tablet (81 mg total) by mouth daily. 30 tablet 0  . atorvastatin (LIPITOR) 80 MG tablet Take 1 tablet (80 mg total) by mouth daily. 90 tablet 3  . divalproex (DEPAKOTE) 500 MG DR tablet Take 500 mg by mouth at bedtime. 2-3 tabs    . finasteride (PROSCAR) 5 MG tablet TAKE ONE TABLET BY MOUTH DAILY 90 tablet 2  . fluticasone (FLONASE) 50 MCG/ACT nasal spray SPRAY TWO SPRAYS IN THE  AFFECTED NOSTRIL DAILY 16 g 11  . Fluticasone-Salmeterol (ADVAIR DISKUS) 500-50 MCG/DOSE AEPB Inhale 1 puff into the lungs every 12 (twelve) hours. 180 each 3  . ibuprofen (ADVIL) 200 MG tablet Take 400-800 mg by mouth every 8 (eight) hours as needed for moderate pain.     [name] lamoTRIgine (LAMICTAL) 200 MG tablet Take 1 tablet (200 mg total) by mouth every morning. 90 tablet 3  .  lisinopril-hydrochlorothiazide (ZESTORETIC) 20-12.5 MG tablet TAKE ONE TABLET BY MOUTH EVERY MORNING 90 tablet 2  . metoprolol tartrate (LOPRESSOR) 25 MG tablet TAKE ONE-HALF TABLET BY MOUTH DAILY (Patient taking differently: Take 12.5 mg by mouth daily. ) 45 tablet 2  . Multiple Vitamins-Minerals (MULTIVITAMIN ADULTS 50+ PO) Take 1 tablet by mouth daily.     . nortriptyline (PAMELOR) 10 MG capsule TAKE ONE CAPSULE BY MOUTH EVERY NIGHT AT BEDTIME 30 capsule 1            Current Facility-Administered Medications  Medication Dose Route Frequency Provider Last Rate Last Dose  . sodium chloride flush (NS) 0.9 % injection 3 mL 3 mL Intravenous Q12H [name], [name], MD         Allergies  Allergen Reactions  . Codeine Anaphylaxis   Review of Systems  Constitutional: Negative for activity change and fatigue.  HENT: Negative.  He recently saw Dr. Lawana Chambers for dental evaluation and was noted to have an apical lucency on tooth #10. He subsequently underwent root canal therapy by an endodontist and has been cleared for surgery.  Eyes: Negative.  Respiratory: Positive for cough.  Cardiovascular: Negative for chest pain and leg swelling.  Gastrointestinal: Negative.  Endocrine: Negative.  Genitourinary: Negative.  Musculoskeletal: Positive for arthralgias and back pain.  Skin: Negative.  Neurological: Negative for dizziness.  Hematological: Negative.  Psychiatric/Behavioral: The patient is nervous/anxious.   BP (!) 147/71 (BP Location: Right Arm)  Pulse 63  Temp 98.1 F (36.7 C) (Skin)  Resp 20  Ht 5' 5.5" (1.664 m)   Wt 146 lb 9.6 oz (66.5 kg)  SpO2 97% Comment: RA  BMI 24.02 kg/m  Physical Exam  Constitutional:  Appearance: Normal appearance. He is normal weight.  HENT:  Head: Normocephalic and atraumatic.  Eyes:  Extraocular Movements: Extraocular movements intact.  Conjunctiva/sclera: Conjunctivae normal.  Pupils: Pupils are equal, round, and reactive to light.  Neck:  Musculoskeletal: Normal range of motion and neck supple.  Vascular: No carotid bruit.  Cardiovascular:  Rate and Rhythm: Normal rate and regular rhythm.  Heart sounds: Murmur present.  Comments: 3/6 systolic murmur along the right sternal border Pulmonary:  Effort: Pulmonary effort is normal.  Breath sounds: Normal breath sounds. No rhonchi or rales.  Abdominal:  General: Abdomen is flat.  Palpations: Abdomen is soft.  Musculoskeletal: Normal range of motion.  General: No swelling.  Lymphadenopathy:  Cervical: No cervical adenopathy.  Skin:  General: Skin is warm and dry.  Neurological:  General: No focal deficit present.  Mental Status: He is alert and oriented to person, place, and time.  Psychiatric:  Mood and Affect: Mood normal.  Behavior: Behavior normal.  Thought Content: Thought content normal.  Judgment: Judgment normal.   Diagnostic Tests:  ECHOCARDIOGRAM REPORT  Patient Name: Gregory [name] Date of Exam: 10/06/2019  Medical Rec #: [id number] Height: 65.0 in  Accession #: [id number] Weight: 145.0 lb  Date of Birth: October 19, 1951 BSA: 1.73 m  Patient Age: 48 years BP: 144/73 mmHg  Patient Gender: M HR: 51 bpm.  Exam Location: [facility]  Procedure: 2D Echo, Cardiac Doppler and Color Doppler  Indications: I35.0 Nonrheumatic aortic (valve) stenosis  History: Patient has prior history of Echocardiogram examinations, most  recent 03/05/2016. Arrythmias:RBBB Risk Factors:Current Smoker  and Hypertension. Obstructive sleep apnea.  Sonographer: Diamond Nickel RCS  Referring Phys: [place]  1. Left ventricular ejection fraction, by visual estimation, is 55 to 60%. The left ventricle has normal function.  Normal left ventricular size. Left ventricular septal wall thickness was moderately increased. There is moderately increased left  ventricular hypertrophy.  2. Global right ventricle has normal systolic function.The right ventricular size is normal. No increase in right ventricular wall thickness.  3. Left atrial size was normal.  4. Right atrial size was normal.  5. Mild mitral annular calcification.  6. Moderate calcification of the mitral valve leaflet(s).  7. Moderate thickening of the mitral valve leaflet(s).  8. The mitral valve is normal in structure. Trace mitral valve regurgitation.  9. The tricuspid valve is normal in structure. Tricuspid valve regurgitation is mild.  10. The aortic valve has an indeterminant number of cusps Aortic valve regurgitation is mild by color flow Doppler. Severe aortic valve stenosis.  11. There is Severe calcifcation of the aortic valve.  12. There is Severely thickening of the aortic valve.  13. The pulmonic valve was grossly normal. Pulmonic valve regurgitation is mild by color flow Doppler.  FINDINGS  Left Ventricle: Left ventricular ejection fraction, by visual estimation, is 55 to 60%. The left ventricle has normal function. Left ventricular septal wall thickness was moderately increased. There is moderately increased left ventricular hypertrophy.  Normal left ventricular size.  Right Ventricle: The right ventricular size is normal. No increase in right ventricular wall thickness. Global RV systolic function is has normal systolic function.  Left Atrium: Left atrial size was normal in size.  Right Atrium: Right atrial size was normal in size  Pericardium: There is no evidence of pericardial effusion.  Mitral Valve: The mitral valve is normal in structure. There is moderate thickening of the mitral valve leaflet(s). There is  moderate calcification of the mitral valve leaflet(s). Mild mitral annular calcification. Trace mitral valve regurgitation.  Tricuspid Valve: The tricuspid valve is normal in structure. Tricuspid valve regurgitation is mild by color flow Doppler.  Aortic Valve: The aortic valve has an indeterminant number of cusps. There is Severely thickening and Severe calcifcation of the aortic valve. Aortic valve regurgitation is mild by color flow Doppler. Aortic regurgitation PHT measures 522 msec. Severe  aortic stenosis is present. There is Severely thickening of the aortic valve. Severe calcifcation. Aortic valve mean gradient measures 50.0 mmHg. Aortic valve peak gradient measures 89.0 mmHg. Aortic valve area, by VTI measures 0.84 cm.  Pulmonic Valve: The pulmonic valve was grossly normal. Pulmonic valve regurgitation is mild by color flow Doppler.  Aorta: The aortic root is normal in size and structure.  IAS/Shunts: No atrial level shunt detected by color flow Doppler.  LEFT VENTRICLE  PLAX 2D  LVIDd: 4.00 cm Diastology  LVIDs: 2.20 cm LV e' lateral: 8.49 cm/s  LV PW: 1.50 cm LV E/e' lateral: 9.3  LV IVS: 1.60 cm LV e' medial: 5.66 cm/s  LVOT diam: 2.00 cm LV E/e' medial: 13.9  LV SV: 54 ml  LV SV Index: 30.83  LVOT Area: 3.14 cm  RIGHT VENTRICLE  RV Basal diam: 2.40 cm  RV S prime: 13.20 cm/s  TAPSE (M-mode): 2.4 cm  LEFT ATRIUM Index RIGHT ATRIUM Index  LA diam: 3.50 cm 2.03 cm/m RA Area: 11.20 cm  LA Vol (A2C): 44.1 ml 25.56 ml/m RA Volume: 21.90 ml 12.69 ml/m  LA Vol (A4C): 35.9 ml 20.81 ml/m  LA Biplane Vol: 40.9 ml 23.70 ml/m  AORTIC VALVE  AV Area (Vmax): 0.71 cm  AV Area (Vmean): 0.68 cm  AV Area (VTI): 0.84 cm  AV Vmax: 471.67 cm/s  AV Vmean: 328.000 cm/s  AV VTI:  1.093 m  AV Peak Grad: 89.0 mmHg  AV Mean Grad: 50.0 mmHg  LVOT Vmax: 106.00 cm/s  LVOT Vmean: 71.500 cm/s  LVOT VTI: 0.293 m  LVOT/AV VTI ratio: 0.27  AI PHT: 522 msec  AORTA  Ao Root diam: 2.70 cm   MITRAL VALVE  MV Area (PHT): 2.76 cm SHUNTS  MV PHT: 79.75 msec Systemic VTI: 0.29 m  MV Decel Time: 275 msec Systemic Diam: 2.00 cm  MV E velocity: 78.80 cm/s 103 cm/s  MV A velocity: 114.00 cm/s 70.3 cm/s  MV E/A ratio: 0.69 1.5  Jenkins Rouge MD  Electronically signed by Jenkins Rouge MD  Signature Date/Time: 10/06/2019/4:42:21 PM  Physicians  Panel Physicians Referring Physician Case Authorizing Physician  [name], [name], MD (Primary)    Procedures  RIGHT/LEFT HEART CATH AND CORONARY ANGIOGRAPHY  Conclusion  Prox LAD to Mid LAD lesion is 99% stenosed.  Mid Cx to Dist Cx lesion is 50% stenosed.  Mid RCA lesion is 30% stenosed.  The left ventricular systolic function is normal.  LV end diastolic pressure is normal.  The left ventricular ejection fraction is 55-65% by visual estimate.  There is severe aortic valve stenosis. 1. Critical single vessel obstructive CAD with 99% mid LAD - heavily calcified  2. Severe aortic stenosis. Mean gradient 47 mm Hg. AVA 0.68 cm squared with index 0.39.  3. Normal LV filling pressures  4. Normal Right heart pressures  5. Normal cardiac output.  Plan: referral to CT surgery for combined AVR and CABG.   Recommendations  Antiplatelet/Anticoag Recommend Aspirin 81mg  daily for moderate CAD.  Indications  Severe aortic stenosis [I35.0 (ICD-10-CM)]  Procedural Details  Technical Details Indication: 68 yo WM with severe aortic stenosis and severe PAD  Procedural Details: The right wrist was prepped, draped, and anesthetized with 1% lidocaine. Using the modified Seldinger technique a 6 Fr slender sheath was placed in the right radial artery and a 5 French sheath was placed in the right brachial vein. A Swan-Ganz catheter was used for the right heart catheterization. Standard protocol was followed for recording of right heart pressures and sampling of oxygen saturations. Fick cardiac output was calculated. Standard Judkins catheters were used for  selective coronary angiography and left ventriculography. There were no immediate procedural complications. The patient was transferred to the post catheterization recovery area for further monitoring. Contrast: 70 cc Estimated blood loss <50 mL.   During this procedure medications were administered to achieve and maintain moderate conscious sedation while the patient's heart rate, blood pressure, and oxygen saturation were continuously monitored and I was present face-to-face 100% of this time.  Medications  (Filter: Administrations occurring from 11/10/19 0715 to 11/10/19 0816)          (important) Continuous medications are totaled by the amount administered until 11/10/19 0816.  Medication Rate/Dose/Volume Action  Date Time   midazolam (VERSED) injection (mg) 1 mg Given 11/10/19 0730   Total dose as of 11/10/19 0816        1 mg        fentaNYL (SUBLIMAZE) injection (mcg) 25 mcg Given 11/10/19 0730   Total dose as of 11/10/19 0816        25 mcg        Heparin (Porcine) in NaCl 1000-0.9 UT/500ML-% SOLN (mL) 500 mL Given 11/10/19 0731   Total dose as of 11/10/19 0816        500 mL        Heparin (Porcine) in NaCl 1000-0.9 UT/500ML-%  SOLN (mL) 500 mL Given 11/10/19 0731   Total dose as of 11/10/19 0816        500 mL        lidocaine (PF) (XYLOCAINE) 1 % injection (mL) 2 mL Given 11/10/19 0736   Total dose as of 11/10/19 0816 2 mL Given 0737   4 mL        heparin injection (Units) 3,500 Units Given 11/10/19 0743   Total dose as of 11/10/19 0816        3,500 Units        Radial Cocktail/Verapamil only (mL) 10 mL Given 11/10/19 0738   Total dose as of 11/10/19 0816        10 mL        iohexol (OMNIPAQUE) 350 MG/ML injection (mL) 70 mL Given 11/10/19 0802   Total dose as of 11/10/19 0816        70 mL        Sedation Time  Sedation Time Physician-1: 36 minutes 8 seconds  Contrast  Medication Name Total Dose  iohexol (OMNIPAQUE) 350 MG/ML injection 70 mL  Radiation/Fluoro  Fluoro  time: 7 (min)  DAP: 16127 (mGycm2)  Cumulative Air Kerma: Q000111Q (mGy)  Complications  Complications documented before study signed (11/10/2019 99991111 AM)   No complications were associated with this study.  Documented by [name], [name], MD - 11/10/2019 8:14 AM  Coronary Findings  Diagnostic  Dominance: Right  Left Main  Vessel was injected. Vessel is normal in caliber. Vessel is angiographically normal.  Left Anterior Descending  Collaterals  Mid LAD filled by collaterals from RPDA.    Prox LAD to Mid LAD lesion 99% stenosed  Prox LAD to Mid LAD lesion is 99% stenosed. The lesion is segmental. The lesion is severely calcified.  Left Circumflex  Mid Cx to Dist Cx lesion 50% stenosed  Mid Cx to Dist Cx lesion is 50% stenosed.  Right Coronary Artery  Mid RCA lesion 30% stenosed  Mid RCA lesion is 30% stenosed.  Intervention  No interventions have been documented.  Wall Motion        Resting         All segments of the heart are normal.        Left Heart  Left Ventricle The left ventricular size is normal. The left ventricular systolic function is normal. LV end diastolic pressure is normal. The left ventricular ejection fraction is 55-65% by visual estimate. No regional wall motion abnormalities.  Aortic Valve There is severe aortic valve stenosis. The aortic valve is calcified. There is restricted aortic valve motion.  Coronary Diagrams  Diagnostic  Dominance: Right   Intervention  Implants     No implant documentation for this case.  Syngo Images  Link to Procedure Log   Show images for CARDIAC CATHETERIZATION Procedure Log  Images on Long Term Storage    Show images for Lamare, Antes   Hemo Data   Most Recent Value  Fick Cardiac Output 4.42 L/min  Fick Cardiac Output Index 2.53 (L/min)/BSA  Aortic Mean Gradient 46.8 mmHg  Aortic Peak Gradient 49 mmHg  Aortic Valve Area 0.68  Aortic Value Area Index 0.39 cm2/BSA  RA A Wave 5 mmHg  RA V Wave 2 mmHg  RA Mean  1 mmHg  RV Systolic Pressure 30 mmHg  RV Diastolic Pressure 0 mmHg  RV EDP 1 mmHg  PA Systolic Pressure 32 mmHg  PA Diastolic Pressure 11 mmHg  PA Mean 17 mmHg  PW A Wave 13 mmHg  PW V Wave 9 mmHg  PW Mean 7 mmHg  AO Systolic Pressure 99991111 mmHg  AO Diastolic Pressure 47 mmHg  AO Mean 71 mmHg  LV Systolic Pressure 0000000 mmHg  LV Diastolic Pressure 7 mmHg  LV EDP 26 mmHg  AOp Systolic Pressure 123456 mmHg  AOp Diastolic Pressure 40 mmHg  AOp Mean Pressure 66 mmHg  LVp Systolic Pressure 123XX123 mmHg  LVp Diastolic Pressure 8 mmHg  LVp EDP Pressure 27 mmHg  QP/QS 1  TPVR Index 6.72 HRUI  TSVR Index 28.06 HRUI  PVR SVR Ratio 0.14  TPVR/TSVR Ratio 0.24    Impression:   This 68 year old gentleman has stage C, asymptomatic, severe aortic stenosis with 99% stenosis of the proximal to mid LAD. I have personally reviewed his 2D echocardiogram, cardiac catheterization, and CTA studies. His echocardiogram shows a trileaflet aortic valve with severe thickening and calcification with restricted leaflet mobility. The mean gradient across the aortic valve was measured at 50 mmHg consistent with severe aortic stenosis. Left ventricular systolic function is normal. Cardiac catheterization shows a 99% proximal to mid LAD stenosis over a long segment with otherwise widely patent coronary arteries. I agree that the best treatment for this gentleman is open surgical aortic valve replacement and coronary artery bypass grafting using a left internal mammary graft to the LAD. His coronary stenosis is not favorable for PCI and his aortic valve shows bulky calcification both of which make open surgical treatment the best option. I would plan to use a bioprosthetic valve given his age. I reviewed the echocardiogram and cardiac catheterization images with him and his wife and answered all their questions. I discussed the operative procedure with the patient and family including alternatives, benefits and risks; including but  not limited to bleeding, blood transfusion, infection, stroke, myocardial infarction, graft failure, heart block requiring a permanent pacemaker, organ dysfunction, and death. [name] understands and agrees to proceed.    Plan:   Aortic valve replacement and coronary artery bypass graft surgery.   Gaye Pollack, MD  [facility]  [phone]

## 2019-11-24 ENCOUNTER — Inpatient Hospital Stay (HOSPITAL_COMMUNITY): Payer: PPO

## 2019-11-24 ENCOUNTER — Inpatient Hospital Stay (HOSPITAL_COMMUNITY)
Admission: RE | Admit: 2019-11-24 | Discharge: 2019-11-30 | DRG: 267 | Disposition: A | Discharge status: Home / Self Care | Payer: PPO | Attending: Surgery | Admitting: Surgery

## 2019-11-24 ENCOUNTER — Encounter (HOSPITAL_COMMUNITY)
Admission: RE | Disposition: A | Discharge status: Home / Self Care | Payer: Self-pay | Source: Home / Self Care | Attending: Surgery

## 2019-11-24 ENCOUNTER — Inpatient Hospital Stay (HOSPITAL_COMMUNITY): Payer: PPO | Admitting: Certified Registered"

## 2019-11-24 ENCOUNTER — Other Ambulatory Visit: Payer: Self-pay

## 2019-11-24 ENCOUNTER — Inpatient Hospital Stay (HOSPITAL_COMMUNITY): Payer: PPO | Admitting: Physician Assistant

## 2019-11-24 ENCOUNTER — Encounter (HOSPITAL_COMMUNITY): Payer: Self-pay

## 2019-11-24 DIAGNOSIS — Z8249 Family history of ischemic heart disease and other diseases of the circulatory system: Secondary | ICD-10-CM | POA: Diagnosis not present

## 2019-11-24 DIAGNOSIS — Z791 Long term (current) use of non-steroidal anti-inflammatories (NSAID): Secondary | ICD-10-CM | POA: Diagnosis not present

## 2019-11-24 DIAGNOSIS — R001 Bradycardia, unspecified: Secondary | ICD-10-CM | POA: Diagnosis present

## 2019-11-24 DIAGNOSIS — Z79899 Other long term (current) drug therapy: Secondary | ICD-10-CM | POA: Diagnosis not present

## 2019-11-24 DIAGNOSIS — I739 Peripheral vascular disease, unspecified: Secondary | ICD-10-CM | POA: Diagnosis present

## 2019-11-24 DIAGNOSIS — Z813 Family history of other psychoactive substance abuse and dependence: Secondary | ICD-10-CM | POA: Diagnosis not present

## 2019-11-24 DIAGNOSIS — Z7982 Long term (current) use of aspirin: Secondary | ICD-10-CM | POA: Diagnosis not present

## 2019-11-24 DIAGNOSIS — I442 Atrioventricular block, complete: Secondary | ICD-10-CM | POA: Diagnosis not present

## 2019-11-24 DIAGNOSIS — F1021 Alcohol dependence, in remission: Secondary | ICD-10-CM | POA: Diagnosis present

## 2019-11-24 DIAGNOSIS — I10 Essential (primary) hypertension: Secondary | ICD-10-CM | POA: Diagnosis present

## 2019-11-24 DIAGNOSIS — Z953 Presence of xenogenic heart valve: Secondary | ICD-10-CM

## 2019-11-24 DIAGNOSIS — M549 Dorsalgia, unspecified: Secondary | ICD-10-CM | POA: Diagnosis present

## 2019-11-24 DIAGNOSIS — I452 Bifascicular block: Secondary | ICD-10-CM | POA: Diagnosis present

## 2019-11-24 DIAGNOSIS — I350 Nonrheumatic aortic (valve) stenosis: Principal | ICD-10-CM

## 2019-11-24 DIAGNOSIS — G4733 Obstructive sleep apnea (adult) (pediatric): Secondary | ICD-10-CM | POA: Diagnosis present

## 2019-11-24 DIAGNOSIS — I2510 Atherosclerotic heart disease of native coronary artery without angina pectoris: Secondary | ICD-10-CM | POA: Diagnosis present

## 2019-11-24 DIAGNOSIS — Z951 Presence of aortocoronary bypass graft: Secondary | ICD-10-CM | POA: Diagnosis not present

## 2019-11-24 DIAGNOSIS — Z9889 Other specified postprocedural states: Secondary | ICD-10-CM

## 2019-11-24 DIAGNOSIS — G8929 Other chronic pain: Secondary | ICD-10-CM | POA: Diagnosis present

## 2019-11-24 DIAGNOSIS — D696 Thrombocytopenia, unspecified: Secondary | ICD-10-CM | POA: Diagnosis present

## 2019-11-24 DIAGNOSIS — Z885 Allergy status to narcotic agent status: Secondary | ICD-10-CM | POA: Diagnosis not present

## 2019-11-24 DIAGNOSIS — Z833 Family history of diabetes mellitus: Secondary | ICD-10-CM | POA: Diagnosis not present

## 2019-11-24 DIAGNOSIS — F319 Bipolar disorder, unspecified: Secondary | ICD-10-CM | POA: Diagnosis present

## 2019-11-24 DIAGNOSIS — Z823 Family history of stroke: Secondary | ICD-10-CM

## 2019-11-24 DIAGNOSIS — Z20828 Contact with and (suspected) exposure to other viral communicable diseases: Secondary | ICD-10-CM | POA: Diagnosis present

## 2019-11-24 DIAGNOSIS — F17210 Nicotine dependence, cigarettes, uncomplicated: Secondary | ICD-10-CM | POA: Diagnosis present

## 2019-11-24 DIAGNOSIS — Z809 Family history of malignant neoplasm, unspecified: Secondary | ICD-10-CM | POA: Diagnosis not present

## 2019-11-24 DIAGNOSIS — I4510 Unspecified right bundle-branch block: Secondary | ICD-10-CM | POA: Diagnosis not present

## 2019-11-24 DIAGNOSIS — J90 Pleural effusion, not elsewhere classified: Secondary | ICD-10-CM

## 2019-11-24 DIAGNOSIS — E785 Hyperlipidemia, unspecified: Secondary | ICD-10-CM | POA: Diagnosis present

## 2019-11-24 DIAGNOSIS — Z952 Presence of prosthetic heart valve: Secondary | ICD-10-CM

## 2019-11-24 HISTORY — PX: CORONARY ARTERY BYPASS GRAFT: SHX141

## 2019-11-24 HISTORY — PX: TEE WITHOUT CARDIOVERSION: SHX5443

## 2019-11-24 HISTORY — PX: AORTIC VALVE REPLACEMENT: SHX41

## 2019-11-24 LAB — POCT I-STAT 7, (LYTES, BLD GAS, ICA,H+H)
Acid-Base Excess: 3 mmol/L — ABNORMAL HIGH (ref 0.0–2.0)
Acid-base deficit: 2 mmol/L (ref 0.0–2.0)
Acid-base deficit: 2 mmol/L (ref 0.0–2.0)
Acid-base deficit: 1 mmol/L (ref 0.0–2.0)
Bicarbonate: 27.2 mmol/L (ref 20.0–28.0)
Bicarbonate: 23.7 mmol/L (ref 20.0–28.0)
Bicarbonate: 23.9 mmol/L (ref 20.0–28.0)
Bicarbonate: 24.2 mmol/L (ref 20.0–28.0)
Calcium, Ion: 0.91 mmol/L — ABNORMAL LOW (ref 1.15–1.40)
Calcium, Ion: 1.1 mmol/L — ABNORMAL LOW (ref 1.15–1.40)
Calcium, Ion: 1.11 mmol/L — ABNORMAL LOW (ref 1.15–1.40)
Calcium, Ion: 1.11 mmol/L — ABNORMAL LOW (ref 1.15–1.40)
HCT: 26 % — ABNORMAL LOW (ref 39.0–52.0)
HCT: 33 % — ABNORMAL LOW (ref 39.0–52.0)
HCT: 30 % — ABNORMAL LOW (ref 39.0–52.0)
HCT: 31 % — ABNORMAL LOW (ref 39.0–52.0)
Hemoglobin: 8.8 g/dL — ABNORMAL LOW (ref 13.0–17.0)
Hemoglobin: 11.2 g/dL — ABNORMAL LOW (ref 13.0–17.0)
Hemoglobin: 10.2 g/dL — ABNORMAL LOW (ref 13.0–17.0)
Hemoglobin: 10.5 g/dL — ABNORMAL LOW (ref 13.0–17.0)
O2 Saturation: 100 %
O2 Saturation: 99 %
O2 Saturation: 99 %
O2 Saturation: 99 %
Patient temperature: 36.1
Potassium: 4.3 mmol/L (ref 3.5–5.1)
Potassium: 4 mmol/L (ref 3.5–5.1)
Potassium: 4.6 mmol/L (ref 3.5–5.1)
Potassium: 4.8 mmol/L (ref 3.5–5.1)
Sodium: 139 mmol/L (ref 135–145)
Sodium: 142 mmol/L (ref 135–145)
Sodium: 140 mmol/L (ref 135–145)
Sodium: 140 mmol/L (ref 135–145)
TCO2: 28 mmol/L (ref 22–32)
TCO2: 25 mmol/L (ref 22–32)
TCO2: 25 mmol/L (ref 22–32)
TCO2: 26 mmol/L (ref 22–32)
pCO2 arterial: 38.2 mmHg (ref 32.0–48.0)
pCO2 arterial: 40.1 mmHg (ref 32.0–48.0)
pCO2 arterial: 44.4 mmHg (ref 32.0–48.0)
pCO2 arterial: 44 mmHg (ref 32.0–48.0)
pH, Arterial: 7.46 — ABNORMAL HIGH (ref 7.350–7.450)
pH, Arterial: 7.375 (ref 7.350–7.450)
pH, Arterial: 7.34 — ABNORMAL LOW (ref 7.350–7.450)
pH, Arterial: 7.349 — ABNORMAL LOW (ref 7.350–7.450)
pO2, Arterial: 384 mmHg — ABNORMAL HIGH (ref 83.0–108.0)
pO2, Arterial: 127 mmHg — ABNORMAL HIGH (ref 83.0–108.0)
pO2, Arterial: 143 mmHg — ABNORMAL HIGH (ref 83.0–108.0)
pO2, Arterial: 148 mmHg — ABNORMAL HIGH (ref 83.0–108.0)

## 2019-11-24 LAB — POCT I-STAT, CHEM 8
BUN: 14 mg/dL (ref 8–23)
BUN: 12 mg/dL (ref 8–23)
BUN: 12 mg/dL (ref 8–23)
BUN: 11 mg/dL (ref 8–23)
BUN: 12 mg/dL (ref 8–23)
Calcium, Ion: 1.17 mmol/L (ref 1.15–1.40)
Calcium, Ion: 1.03 mmol/L — ABNORMAL LOW (ref 1.15–1.40)
Calcium, Ion: 1.16 mmol/L (ref 1.15–1.40)
Calcium, Ion: 1.07 mmol/L — ABNORMAL LOW (ref 1.15–1.40)
Calcium, Ion: 1.03 mmol/L — ABNORMAL LOW (ref 1.15–1.40)
Chloride: 101 mmol/L (ref 98–111)
Chloride: 100 mmol/L (ref 98–111)
Chloride: 104 mmol/L (ref 98–111)
Chloride: 102 mmol/L (ref 98–111)
Chloride: 102 mmol/L (ref 98–111)
Creatinine, Ser: 0.6 mg/dL — ABNORMAL LOW (ref 0.61–1.24)
Creatinine, Ser: 0.5 mg/dL — ABNORMAL LOW (ref 0.61–1.24)
Creatinine, Ser: 0.6 mg/dL — ABNORMAL LOW (ref 0.61–1.24)
Creatinine, Ser: 0.5 mg/dL — ABNORMAL LOW (ref 0.61–1.24)
Creatinine, Ser: 0.6 mg/dL — ABNORMAL LOW (ref 0.61–1.24)
Glucose, Bld: 95 mg/dL (ref 70–99)
Glucose, Bld: 102 mg/dL — ABNORMAL HIGH (ref 70–99)
Glucose, Bld: 105 mg/dL — ABNORMAL HIGH (ref 70–99)
Glucose, Bld: 135 mg/dL — ABNORMAL HIGH (ref 70–99)
Glucose, Bld: 129 mg/dL — ABNORMAL HIGH (ref 70–99)
HCT: 37 % — ABNORMAL LOW (ref 39.0–52.0)
HCT: 27 % — ABNORMAL LOW (ref 39.0–52.0)
HCT: 35 % — ABNORMAL LOW (ref 39.0–52.0)
HCT: 28 % — ABNORMAL LOW (ref 39.0–52.0)
HCT: 26 % — ABNORMAL LOW (ref 39.0–52.0)
Hemoglobin: 12.6 g/dL — ABNORMAL LOW (ref 13.0–17.0)
Hemoglobin: 11.9 g/dL — ABNORMAL LOW (ref 13.0–17.0)
Hemoglobin: 9.2 g/dL — ABNORMAL LOW (ref 13.0–17.0)
Hemoglobin: 9.5 g/dL — ABNORMAL LOW (ref 13.0–17.0)
Hemoglobin: 8.8 g/dL — ABNORMAL LOW (ref 13.0–17.0)
Potassium: 4.1 mmol/L (ref 3.5–5.1)
Potassium: 4.1 mmol/L (ref 3.5–5.1)
Potassium: 4.8 mmol/L (ref 3.5–5.1)
Potassium: 5.3 mmol/L — ABNORMAL HIGH (ref 3.5–5.1)
Potassium: 4.6 mmol/L (ref 3.5–5.1)
Sodium: 139 mmol/L (ref 135–145)
Sodium: 135 mmol/L (ref 135–145)
Sodium: 138 mmol/L (ref 135–145)
Sodium: 136 mmol/L (ref 135–145)
Sodium: 137 mmol/L (ref 135–145)
TCO2: 31 mmol/L (ref 22–32)
TCO2: 28 mmol/L (ref 22–32)
TCO2: 28 mmol/L (ref 22–32)
TCO2: 28 mmol/L (ref 22–32)
TCO2: 28 mmol/L (ref 22–32)

## 2019-11-24 LAB — GLUCOSE, CAPILLARY
Glucose-Capillary: 110 mg/dL — ABNORMAL HIGH (ref 70–99)
Glucose-Capillary: 111 mg/dL — ABNORMAL HIGH (ref 70–99)
Glucose-Capillary: 115 mg/dL — ABNORMAL HIGH (ref 70–99)
Glucose-Capillary: 115 mg/dL — ABNORMAL HIGH (ref 70–99)
Glucose-Capillary: 116 mg/dL — ABNORMAL HIGH (ref 70–99)
Glucose-Capillary: 129 mg/dL — ABNORMAL HIGH (ref 70–99)

## 2019-11-24 LAB — CBC
HCT: 31.9 % — ABNORMAL LOW (ref 39.0–52.0)
HCT: 33.3 % — ABNORMAL LOW (ref 39.0–52.0)
Hemoglobin: 10.9 g/dL — ABNORMAL LOW (ref 13.0–17.0)
Hemoglobin: 11.4 g/dL — ABNORMAL LOW (ref 13.0–17.0)
MCH: 30.9 pg (ref 26.0–34.0)
MCH: 31.1 pg (ref 26.0–34.0)
MCHC: 34.2 g/dL (ref 30.0–36.0)
MCHC: 34.2 g/dL (ref 30.0–36.0)
MCV: 90.4 fL (ref 80.0–100.0)
MCV: 91 fL (ref 80.0–100.0)
Platelets: 87 10*3/uL — ABNORMAL LOW (ref 150–400)
Platelets: 112 10*3/uL — ABNORMAL LOW (ref 150–400)
RBC: 3.53 MIL/uL — ABNORMAL LOW (ref 4.22–5.81)
RBC: 3.66 MIL/uL — ABNORMAL LOW (ref 4.22–5.81)
RDW: 13.7 % (ref 11.5–15.5)
RDW: 13.8 % (ref 11.5–15.5)
WBC: 8.4 10*3/uL (ref 4.0–10.5)
WBC: 8.2 10*3/uL (ref 4.0–10.5)
nRBC: 0 % (ref 0.0–0.2)
nRBC: 0 % (ref 0.0–0.2)

## 2019-11-24 LAB — MAGNESIUM: Magnesium: 3.1 mg/dL — ABNORMAL HIGH (ref 1.7–2.4)

## 2019-11-24 LAB — BASIC METABOLIC PANEL
Anion gap: 6 (ref 5–15)
BUN: 10 mg/dL (ref 8–23)
CO2: 23 mmol/L (ref 22–32)
Calcium: 7.7 mg/dL — ABNORMAL LOW (ref 8.9–10.3)
Chloride: 109 mmol/L (ref 98–111)
Creatinine, Ser: 0.8 mg/dL (ref 0.61–1.24)
GFR calc Af Amer: >60 mL/min (ref >60)
GFR calc non Af Amer: >60 mL/min (ref >60)
Glucose, Bld: 120 mg/dL — ABNORMAL HIGH (ref 70–99)
Potassium: 4.9 mmol/L (ref 3.5–5.1)
Sodium: 138 mmol/L (ref 135–145)

## 2019-11-24 LAB — APTT: aPTT: 40 seconds — ABNORMAL HIGH (ref 24–36)

## 2019-11-24 LAB — PROTIME-INR
INR: 1.5 — ABNORMAL HIGH (ref 0.8–1.2)
Prothrombin Time: 18 seconds — ABNORMAL HIGH (ref 11.4–15.2)

## 2019-11-24 LAB — HEMOGLOBIN AND HEMATOCRIT, BLOOD
HCT: 28.7 % — ABNORMAL LOW (ref 39.0–52.0)
Hemoglobin: 9.9 g/dL — ABNORMAL LOW (ref 13.0–17.0)

## 2019-11-24 LAB — PLATELET COUNT: Platelets: 124 10*3/uL — ABNORMAL LOW (ref 150–400)

## 2019-11-24 SURGERY — REPLACEMENT, AORTIC VALVE, OPEN
Anesthesia: General | Site: Chest

## 2019-11-24 MED ORDER — THROMBIN 20000 UNITS EX SOLR
CUTANEOUS | Status: DC | PRN
  Administered 2019-11-24: 20000 [IU] via TOPICAL

## 2019-11-24 MED ORDER — MIDAZOLAM HCL (PF) 10 MG/2ML IJ SOLN
INTRAMUSCULAR | Status: AC
  Filled 2019-11-24: qty 2

## 2019-11-24 MED ORDER — ALBUMIN HUMAN 5 % IV SOLN
INTRAVENOUS | Status: DC | PRN
  Administered 2019-11-24: 12:00:00 via INTRAVENOUS

## 2019-11-24 MED ORDER — ASPIRIN 81 MG PO CHEW
324.0000 mg | CHEWABLE_TABLET | Freq: Every day | ORAL | Status: DC
  Filled 2019-11-24: qty 4

## 2019-11-24 MED ORDER — ROCURONIUM BROMIDE 10 MG/ML (PF) SYRINGE
PREFILLED_SYRINGE | INTRAVENOUS | Status: AC
  Filled 2019-11-24: qty 10

## 2019-11-24 MED ORDER — PROTAMINE SULFATE 10 MG/ML IV SOLN
INTRAVENOUS | Status: AC
  Filled 2019-11-24: qty 25

## 2019-11-24 MED ORDER — DOCUSATE SODIUM 100 MG PO CAPS
200.0000 mg | ORAL_CAPSULE | Freq: Every day | ORAL | Status: DC
  Administered 2019-11-25 – 2019-11-27 (×3): 200 mg via ORAL
  Filled 2019-11-24 (×3): qty 2

## 2019-11-24 MED ORDER — SODIUM CHLORIDE 0.9% FLUSH
3.0000 mL | Freq: Two times a day (BID) | INTRAVENOUS | Status: DC
  Administered 2019-11-25 – 2019-11-27 (×5): 3 mL via INTRAVENOUS

## 2019-11-24 MED ORDER — EPHEDRINE SULFATE 50 MG/ML IJ SOLN
INTRAMUSCULAR | Status: DC | PRN
  Administered 2019-11-24: 5 mg via INTRAVENOUS

## 2019-11-24 MED ORDER — MAGNESIUM SULFATE 4 GM/100ML IV SOLN
4.0000 g | Freq: Once | INTRAVENOUS | Status: AC
  Administered 2019-11-24: 4 g via INTRAVENOUS
  Filled 2019-11-24: qty 100

## 2019-11-24 MED ORDER — SODIUM CHLORIDE 0.9 % IV SOLN: 250.0000 mL | INTRAVENOUS | Status: DC

## 2019-11-24 MED ORDER — PHENYLEPHRINE HCL-NACL 10-0.9 MG/250ML-% IV SOLN
INTRAVENOUS | Status: DC | PRN
  Administered 2019-11-24: 50 ug/min via INTRAVENOUS

## 2019-11-24 MED ORDER — THROMBIN (RECOMBINANT) 20000 UNITS EX SOLR
CUTANEOUS | Status: AC
  Filled 2019-11-24: qty 20000

## 2019-11-24 MED ORDER — ALBUMIN HUMAN 5 % IV SOLN
250.0000 mL | INTRAVENOUS | Status: AC | PRN
  Administered 2019-11-24 – 2019-11-25 (×2): 12.5 g via INTRAVENOUS
  Filled 2019-11-24: qty 250

## 2019-11-24 MED ORDER — SODIUM CHLORIDE 0.9 % IV SOLN
INTRAVENOUS | Status: DC | PRN
  Administered 2019-11-24: 11:00:00 via INTRAVENOUS

## 2019-11-24 MED ORDER — MORPHINE SULFATE (PF) 2 MG/ML IV SOLN
1.0000 mg | INTRAVENOUS | Status: DC | PRN
  Administered 2019-11-24 – 2019-11-28 (×14): 2 mg via INTRAVENOUS
  Filled 2019-11-24 (×15): qty 1

## 2019-11-24 MED ORDER — PROPOFOL 10 MG/ML IV BOLUS
INTRAVENOUS | Status: DC | PRN
  Administered 2019-11-24: 150 mg via INTRAVENOUS

## 2019-11-24 MED ORDER — ATORVASTATIN CALCIUM 80 MG PO TABS
80.0000 mg | ORAL_TABLET | Freq: Every day | ORAL | Status: DC
  Administered 2019-11-24 – 2019-11-30 (×7): 80 mg via ORAL
  Filled 2019-11-24 (×7): qty 1

## 2019-11-24 MED ORDER — DEXMEDETOMIDINE HCL IN NACL 400 MCG/100ML IV SOLN: 0.0000 ug/kg/h | INTRAVENOUS | Status: DC

## 2019-11-24 MED ORDER — ACETAMINOPHEN 160 MG/5ML PO SOLN: 1000.0000 mg | Freq: Four times a day (QID) | ORAL | Status: DC

## 2019-11-24 MED ORDER — DEXAMETHASONE SODIUM PHOSPHATE 10 MG/ML IJ SOLN
INTRAMUSCULAR | Status: AC
  Filled 2019-11-24: qty 1

## 2019-11-24 MED ORDER — VANCOMYCIN HCL 1000 MG IV SOLR
INTRAVENOUS | Status: DC | PRN
  Administered 2019-11-24: 1250 mg via INTRAVENOUS

## 2019-11-24 MED ORDER — LACTATED RINGERS IV SOLN: INTRAVENOUS | Status: DC

## 2019-11-24 MED ORDER — BISACODYL 10 MG RE SUPP: 10.0000 mg | Freq: Every day | RECTAL | Status: DC

## 2019-11-24 MED ORDER — NITROGLYCERIN 0.2 MG/ML ON CALL CATH LAB
INTRAVENOUS | Status: DC | PRN
  Administered 2019-11-24 (×2): 20 ug via INTRAVENOUS

## 2019-11-24 MED ORDER — ONDANSETRON HCL 4 MG/2ML IJ SOLN
INTRAMUSCULAR | Status: AC
  Filled 2019-11-24: qty 2

## 2019-11-24 MED ORDER — LAMOTRIGINE 100 MG PO TABS
200.0000 mg | ORAL_TABLET | Freq: Every morning | ORAL | Status: DC
  Administered 2019-11-25 – 2019-11-30 (×6): 200 mg via ORAL
  Filled 2019-11-24 (×3): qty 2
  Filled 2019-11-24: qty 8
  Filled 2019-11-24 (×4): qty 2

## 2019-11-24 MED ORDER — HEPARIN SODIUM (PORCINE) 1000 UNIT/ML IJ SOLN
INTRAMUSCULAR | Status: DC | PRN
  Administered 2019-11-24: 25000 [IU] via INTRAVENOUS

## 2019-11-24 MED ORDER — HEMOSTATIC AGENTS (NO CHARGE) OPTIME
TOPICAL | Status: DC | PRN
  Administered 2019-11-24 (×4): 1 via TOPICAL

## 2019-11-24 MED ORDER — SODIUM CHLORIDE 0.9% FLUSH
10.0000 mL | Freq: Two times a day (BID) | INTRAVENOUS | Status: DC
  Administered 2019-11-24: 10 mL
  Administered 2019-11-25: 20 mL
  Administered 2019-11-25 – 2019-11-27 (×3): 10 mL
  Administered 2019-11-27: 20 mL
  Administered 2019-11-28: 10 mL

## 2019-11-24 MED ORDER — ROCURONIUM BROMIDE 10 MG/ML (PF) SYRINGE
PREFILLED_SYRINGE | INTRAVENOUS | Status: DC | PRN
  Administered 2019-11-24: 50 mg via INTRAVENOUS
  Administered 2019-11-24: 30 mg via INTRAVENOUS
  Administered 2019-11-24: 60 mg via INTRAVENOUS
  Administered 2019-11-24: 40 mg via INTRAVENOUS
  Administered 2019-11-24 (×2): 50 mg via INTRAVENOUS

## 2019-11-24 MED ORDER — HEPARIN SODIUM (PORCINE) 1000 UNIT/ML IJ SOLN
INTRAMUSCULAR | Status: AC
  Filled 2019-11-24: qty 1

## 2019-11-24 MED ORDER — MOMETASONE FURO-FORMOTEROL FUM 200-5 MCG/ACT IN AERO
2.0000 | INHALATION_SPRAY | Freq: Two times a day (BID) | RESPIRATORY_TRACT | Status: DC
  Administered 2019-11-25 – 2019-11-30 (×11): 2 via RESPIRATORY_TRACT
  Filled 2019-11-24: qty 8.8

## 2019-11-24 MED ORDER — BISACODYL 5 MG PO TBEC
10.0000 mg | DELAYED_RELEASE_TABLET | Freq: Every day | ORAL | Status: DC
  Administered 2019-11-25 – 2019-11-27 (×3): 10 mg via ORAL
  Filled 2019-11-24 (×3): qty 2

## 2019-11-24 MED ORDER — INSULIN ASPART 100 UNIT/ML ~~LOC~~ SOLN: 0.0000 [IU] | SUBCUTANEOUS | Status: DC

## 2019-11-24 MED ORDER — 0.9 % SODIUM CHLORIDE (POUR BTL) OPTIME
TOPICAL | Status: DC | PRN
  Administered 2019-11-24: 5000 mL
  Administered 2019-11-24: 1000 mL

## 2019-11-24 MED ORDER — TRANEXAMIC ACID 1000 MG/10ML IV SOLN
INTRAVENOUS | Status: DC | PRN
  Administered 2019-11-24: 1.5 mg/kg/h via INTRAVENOUS

## 2019-11-24 MED ORDER — PROPOFOL 10 MG/ML IV BOLUS
INTRAVENOUS | Status: AC
  Filled 2019-11-24: qty 20

## 2019-11-24 MED ORDER — SODIUM CHLORIDE 0.9% FLUSH: 3.0000 mL | INTRAVENOUS | Status: DC | PRN

## 2019-11-24 MED ORDER — SODIUM CHLORIDE 0.9 % IV SOLN
1.5000 g | Freq: Two times a day (BID) | INTRAVENOUS | Status: AC
  Administered 2019-11-25 – 2019-11-26 (×3): 1.5 g via INTRAVENOUS
  Filled 2019-11-24 (×4): qty 1.5

## 2019-11-24 MED ORDER — FENTANYL CITRATE (PF) 250 MCG/5ML IJ SOLN
INTRAMUSCULAR | Status: AC
  Filled 2019-11-24: qty 25

## 2019-11-24 MED ORDER — METOPROLOL TARTRATE 5 MG/5ML IV SOLN: 2.5000 mg | INTRAVENOUS | Status: DC | PRN

## 2019-11-24 MED ORDER — SODIUM CHLORIDE 0.9% FLUSH: 10.0000 mL | INTRAVENOUS | Status: DC | PRN

## 2019-11-24 MED ORDER — MIDAZOLAM HCL 2 MG/2ML IJ SOLN: 2.0000 mg | INTRAMUSCULAR | Status: DC | PRN

## 2019-11-24 MED ORDER — ACETAMINOPHEN 650 MG RE SUPP
650.0000 mg | Freq: Once | RECTAL | Status: AC
  Administered 2019-11-24: 650 mg via RECTAL

## 2019-11-24 MED ORDER — FLUTICASONE PROPIONATE 50 MCG/ACT NA SUSP
2.0000 | Freq: Every day | NASAL | Status: DC
  Administered 2019-11-24 – 2019-11-30 (×7): 2 via NASAL
  Filled 2019-11-24: qty 16

## 2019-11-24 MED ORDER — MIDAZOLAM HCL 5 MG/5ML IJ SOLN
INTRAMUSCULAR | Status: DC | PRN
  Administered 2019-11-24: 1 mg via INTRAVENOUS
  Administered 2019-11-24 (×2): 2 mg via INTRAVENOUS
  Administered 2019-11-24: 3 mg via INTRAVENOUS
  Administered 2019-11-24: 2 mg via INTRAVENOUS

## 2019-11-24 MED ORDER — NORTRIPTYLINE HCL 10 MG PO CAPS
10.0000 mg | ORAL_CAPSULE | Freq: Every day | ORAL | Status: DC
  Filled 2019-11-24: qty 1

## 2019-11-24 MED ORDER — NITROGLYCERIN IN D5W 200-5 MCG/ML-% IV SOLN
0.0000 ug/min | INTRAVENOUS | Status: DC
  Administered 2019-11-24: 0 ug/min via INTRAVENOUS

## 2019-11-24 MED ORDER — FINASTERIDE 5 MG PO TABS
5.0000 mg | ORAL_TABLET | Freq: Every day | ORAL | Status: DC
  Administered 2019-11-25 – 2019-11-30 (×6): 5 mg via ORAL
  Filled 2019-11-24 (×6): qty 1

## 2019-11-24 MED ORDER — CHLORHEXIDINE GLUCONATE 0.12 % MT SOLN
15.0000 mL | OROMUCOSAL | Status: AC
  Administered 2019-11-24: 15 mL via OROMUCOSAL

## 2019-11-24 MED ORDER — CHLORHEXIDINE GLUCONATE 4 % EX LIQD: 30.0000 mL | CUTANEOUS | Status: DC

## 2019-11-24 MED ORDER — FENTANYL CITRATE (PF) 250 MCG/5ML IJ SOLN
INTRAMUSCULAR | Status: DC | PRN
  Administered 2019-11-24 (×3): 100 ug via INTRAVENOUS
  Administered 2019-11-24: 50 ug via INTRAVENOUS
  Administered 2019-11-24 (×3): 100 ug via INTRAVENOUS
  Administered 2019-11-24: 200 ug via INTRAVENOUS
  Administered 2019-11-24 (×4): 100 ug via INTRAVENOUS

## 2019-11-24 MED ORDER — ACETAMINOPHEN 500 MG PO TABS
1000.0000 mg | ORAL_TABLET | Freq: Four times a day (QID) | ORAL | Status: DC
  Administered 2019-11-24 – 2019-11-28 (×15): 1000 mg via ORAL
  Filled 2019-11-24 (×16): qty 2

## 2019-11-24 MED ORDER — POTASSIUM CHLORIDE 10 MEQ/50ML IV SOLN: 10.0000 meq | INTRAVENOUS | Status: AC

## 2019-11-24 MED ORDER — ASPIRIN EC 325 MG PO TBEC
325.0000 mg | DELAYED_RELEASE_TABLET | Freq: Every day | ORAL | Status: DC
  Administered 2019-11-25 – 2019-11-28 (×3): 325 mg via ORAL
  Filled 2019-11-24 (×4): qty 1

## 2019-11-24 MED ORDER — PROTAMINE SULFATE 10 MG/ML IV SOLN
INTRAVENOUS | Status: DC | PRN
  Administered 2019-11-24 (×2): 20 mg via INTRAVENOUS
  Administered 2019-11-24 (×2): 50 mg via INTRAVENOUS
  Administered 2019-11-24 (×2): 30 mg via INTRAVENOUS
  Administered 2019-11-24: 50 mg via INTRAVENOUS

## 2019-11-24 MED ORDER — SODIUM CHLORIDE 0.45 % IV SOLN
INTRAVENOUS | Status: DC | PRN
  Administered 2019-11-24: 400 mL via INTRAVENOUS

## 2019-11-24 MED ORDER — EPHEDRINE 5 MG/ML INJ
INTRAVENOUS | Status: AC
  Filled 2019-11-24: qty 10

## 2019-11-24 MED ORDER — DEXTROSE 50 % IV SOLN: 0.0000 mL | INTRAVENOUS | Status: DC | PRN

## 2019-11-24 MED ORDER — METOPROLOL TARTRATE 12.5 MG HALF TABLET
12.5000 mg | ORAL_TABLET | Freq: Two times a day (BID) | ORAL | Status: DC
  Administered 2019-11-24: 12.5 mg via ORAL
  Filled 2019-11-24: qty 1

## 2019-11-24 MED ORDER — SODIUM CHLORIDE 0.9 % IV SOLN: INTRAVENOUS | Status: DC

## 2019-11-24 MED ORDER — DEXMEDETOMIDINE HCL IN NACL 400 MCG/100ML IV SOLN
INTRAVENOUS | Status: DC | PRN
  Administered 2019-11-24: .3 ug/kg/h via INTRAVENOUS

## 2019-11-24 MED ORDER — METOPROLOL TARTRATE 25 MG/10 ML ORAL SUSPENSION: 12.5000 mg | Freq: Two times a day (BID) | ORAL | Status: DC

## 2019-11-24 MED ORDER — FAMOTIDINE IN NACL 20-0.9 MG/50ML-% IV SOLN
20.0000 mg | Freq: Two times a day (BID) | INTRAVENOUS | Status: DC
  Administered 2019-11-24: 20 mg via INTRAVENOUS

## 2019-11-24 MED ORDER — ORAL CARE MOUTH RINSE
15.0000 mL | Freq: Two times a day (BID) | OROMUCOSAL | Status: DC
  Administered 2019-11-24 – 2019-11-30 (×9): 15 mL via OROMUCOSAL

## 2019-11-24 MED ORDER — INSULIN REGULAR(HUMAN) IN NACL 100-0.9 UT/100ML-% IV SOLN: INTRAVENOUS | Status: DC

## 2019-11-24 MED ORDER — SUCCINYLCHOLINE CHLORIDE 200 MG/10ML IV SOSY
PREFILLED_SYRINGE | INTRAVENOUS | Status: AC
  Filled 2019-11-24: qty 10

## 2019-11-24 MED ORDER — VANCOMYCIN HCL IN DEXTROSE 1-5 GM/200ML-% IV SOLN
1000.0000 mg | Freq: Once | INTRAVENOUS | Status: AC
  Administered 2019-11-24: 1000 mg via INTRAVENOUS
  Filled 2019-11-24: qty 200

## 2019-11-24 MED ORDER — DIVALPROEX SODIUM 500 MG PO DR TAB
500.0000 mg | DELAYED_RELEASE_TABLET | Freq: Every day | ORAL | Status: DC
  Administered 2019-11-24 – 2019-11-29 (×6): 500 mg via ORAL
  Filled 2019-11-24 (×7): qty 1

## 2019-11-24 MED ORDER — PANTOPRAZOLE SODIUM 40 MG PO TBEC
40.0000 mg | DELAYED_RELEASE_TABLET | Freq: Every day | ORAL | Status: DC
  Administered 2019-11-26 – 2019-11-28 (×3): 40 mg via ORAL
  Filled 2019-11-24 (×3): qty 1

## 2019-11-24 MED ORDER — LIDOCAINE HCL (CARDIAC) PF 100 MG/5ML IV SOSY
PREFILLED_SYRINGE | INTRAVENOUS | Status: DC | PRN
  Administered 2019-11-24: 40 mg via INTRAVENOUS

## 2019-11-24 MED ORDER — METOPROLOL TARTRATE 12.5 MG HALF TABLET: 12.5000 mg | ORAL_TABLET | Freq: Once | ORAL | Status: DC

## 2019-11-24 MED ORDER — ACETAMINOPHEN 160 MG/5ML PO SOLN: 650.0000 mg | Freq: Once | ORAL | Status: AC

## 2019-11-24 MED ORDER — ARTIFICIAL TEARS OPHTHALMIC OINT
TOPICAL_OINTMENT | OPHTHALMIC | Status: DC | PRN
  Administered 2019-11-24: 1 via OPHTHALMIC

## 2019-11-24 MED ORDER — PLASMA-LYTE 148 IV SOLN
INTRAVENOUS | Status: DC | PRN
  Administered 2019-11-24: 500 mL

## 2019-11-24 MED ORDER — LACTATED RINGERS IV SOLN
500.0000 mL | Freq: Once | INTRAVENOUS | Status: AC | PRN
  Administered 2019-11-25: 250 mL via INTRAVENOUS

## 2019-11-24 MED ORDER — LEVALBUTEROL HCL 0.63 MG/3ML IN NEBU
0.6300 mg | INHALATION_SOLUTION | Freq: Three times a day (TID) | RESPIRATORY_TRACT | Status: DC | PRN
  Administered 2019-11-27: 0.63 mg via RESPIRATORY_TRACT
  Filled 2019-11-24: qty 3

## 2019-11-24 MED ORDER — CHLORHEXIDINE GLUCONATE CLOTH 2 % EX PADS
6.0000 | MEDICATED_PAD | Freq: Every day | CUTANEOUS | Status: DC
  Administered 2019-11-24 – 2019-11-28 (×4): 6 via TOPICAL

## 2019-11-24 MED ORDER — ONDANSETRON HCL 4 MG/2ML IJ SOLN
4.0000 mg | Freq: Four times a day (QID) | INTRAMUSCULAR | Status: DC | PRN
  Administered 2019-11-26: 4 mg via INTRAVENOUS
  Filled 2019-11-24: qty 2

## 2019-11-24 MED ORDER — CHLORHEXIDINE GLUCONATE 0.12 % MT SOLN
15.0000 mL | Freq: Once | OROMUCOSAL | Status: AC
  Administered 2019-11-24: 15 mL via OROMUCOSAL
  Filled 2019-11-24: qty 15

## 2019-11-24 MED ORDER — LIDOCAINE 2% (20 MG/ML) 5 ML SYRINGE
INTRAMUSCULAR | Status: AC
  Filled 2019-11-24: qty 5

## 2019-11-24 MED ORDER — LACTATED RINGERS IV SOLN
INTRAVENOUS | Status: DC | PRN
  Administered 2019-11-24: 07:00:00 via INTRAVENOUS

## 2019-11-24 MED ORDER — PHENYLEPHRINE HCL-NACL 20-0.9 MG/250ML-% IV SOLN
0.0000 ug/min | INTRAVENOUS | Status: DC
  Administered 2019-11-24: 10 ug/min via INTRAVENOUS

## 2019-11-24 SURGICAL SUPPLY — 116 items
ADAPTER CARDIO PERF ANTE/RETRO (ADAPTER) ×3 IMPLANT
BAG DECANTER FOR FLEXI CONT (MISCELLANEOUS) ×3 IMPLANT
BASKET HEART (ORDER IN 25'S) (MISCELLANEOUS) ×1
BASKET HEART (ORDER IN 25S) (MISCELLANEOUS) ×2 IMPLANT
BLADE CLIPPER SURG (BLADE) IMPLANT
BLADE STERNUM SYSTEM 6 (BLADE) ×3 IMPLANT
BLADE SURG 15 STRL LF DISP TIS (BLADE) ×2 IMPLANT
BLADE SURG 15 STRL SS (BLADE) ×1
BNDG ELASTIC 4X5.8 VLCR STR LF (GAUZE/BANDAGES/DRESSINGS) IMPLANT
BNDG ELASTIC 6X5.8 VLCR STR LF (GAUZE/BANDAGES/DRESSINGS) IMPLANT
BNDG GAUZE ELAST 4 BULKY (GAUZE/BANDAGES/DRESSINGS) ×3 IMPLANT
CANISTER SUCT 3000ML PPV (MISCELLANEOUS) ×3 IMPLANT
CANNULA GUNDRY RCSP 15FR (MISCELLANEOUS) ×3 IMPLANT
CATH HEART VENT LEFT (CATHETERS) ×2 IMPLANT
CATH ROBINSON RED A/P 18FR (CATHETERS) ×9 IMPLANT
CATH THORACIC 28FR (CATHETERS) ×3 IMPLANT
CATH THORACIC 36FR (CATHETERS) ×3 IMPLANT
CATH THORACIC 36FR RT ANG (CATHETERS) ×3 IMPLANT
CLIP VESOCCLUDE MED 24/CT (CLIP) IMPLANT
CLIP VESOCCLUDE SM WIDE 24/CT (CLIP) ×3 IMPLANT
CONT SPEC 4OZ CLIKSEAL STRL BL (MISCELLANEOUS) ×3 IMPLANT
COVER SURGICAL LIGHT HANDLE (MISCELLANEOUS) IMPLANT
COVER WAND RF STERILE (DRAPES) IMPLANT
DRAPE CARDIOVASCULAR INCISE (DRAPES) ×1
DRAPE SLUSH/WARMER DISC (DRAPES) ×3 IMPLANT
DRAPE SRG 135X102X78XABS (DRAPES) ×2 IMPLANT
DRSG COVADERM 4X14 (GAUZE/BANDAGES/DRESSINGS) ×3 IMPLANT
ELECT CAUTERY BLADE 6.4 (BLADE) ×3 IMPLANT
ELECT REM PT RETURN 9FT ADLT (ELECTROSURGICAL) ×6
ELECTRODE REM PT RTRN 9FT ADLT (ELECTROSURGICAL) ×4 IMPLANT
FELT TEFLON 1X6 (MISCELLANEOUS) ×3 IMPLANT
GAUZE SPONGE 4X4 12PLY STRL (GAUZE/BANDAGES/DRESSINGS) ×3 IMPLANT
GAUZE SPONGE 4X4 12PLY STRL LF (GAUZE/BANDAGES/DRESSINGS) ×3 IMPLANT
GLOVE BIO SURGEON STRL SZ 6 (GLOVE) ×9 IMPLANT
GLOVE BIO SURGEON STRL SZ 6.5 (GLOVE) ×3 IMPLANT
GLOVE BIO SURGEON STRL SZ7 (GLOVE) IMPLANT
GLOVE BIO SURGEON STRL SZ7.5 (GLOVE) IMPLANT
GLOVE BIOGEL PI IND STRL 6 (GLOVE) ×8 IMPLANT
GLOVE BIOGEL PI IND STRL 6.5 (GLOVE) ×2 IMPLANT
GLOVE BIOGEL PI IND STRL 7.0 (GLOVE) IMPLANT
GLOVE BIOGEL PI INDICATOR 6 (GLOVE) ×4
GLOVE BIOGEL PI INDICATOR 6.5 (GLOVE) ×1
GLOVE BIOGEL PI INDICATOR 7.0 (GLOVE)
GLOVE EUDERMIC 7 POWDERFREE (GLOVE) ×6 IMPLANT
GLOVE ORTHO TXT STRL SZ7.5 (GLOVE) IMPLANT
GLOVE SURG SS PI 7.5 STRL IVOR (GLOVE) ×6 IMPLANT
GOWN STRL REUS W/ TWL LRG LVL3 (GOWN DISPOSABLE) ×16 IMPLANT
GOWN STRL REUS W/ TWL XL LVL3 (GOWN DISPOSABLE) ×4 IMPLANT
GOWN STRL REUS W/TWL LRG LVL3 (GOWN DISPOSABLE) ×8
GOWN STRL REUS W/TWL XL LVL3 (GOWN DISPOSABLE) ×2
HEMOSTAT POWDER SURGIFOAM 1G (HEMOSTASIS) ×9 IMPLANT
HEMOSTAT SURGICEL 2X14 (HEMOSTASIS) ×3 IMPLANT
INSERT FOGARTY 61MM (MISCELLANEOUS) IMPLANT
INSERT FOGARTY XLG (MISCELLANEOUS) IMPLANT
KIT BASIN OR (CUSTOM PROCEDURE TRAY) ×3 IMPLANT
KIT CATH CPB BARTLE (MISCELLANEOUS) ×3 IMPLANT
KIT SUCTION CATH 14FR (SUCTIONS) ×3 IMPLANT
KIT TURNOVER KIT B (KITS) ×3 IMPLANT
KIT VASOVIEW HEMOPRO 2 VH 4000 (KITS) IMPLANT
LINE VENT (MISCELLANEOUS) ×3 IMPLANT
NS IRRIG 1000ML POUR BTL (IV SOLUTION) ×18 IMPLANT
PACK E OPEN HEART (SUTURE) ×3 IMPLANT
PACK OPEN HEART (CUSTOM PROCEDURE TRAY) ×3 IMPLANT
PAD ARMBOARD 7.5X6 YLW CONV (MISCELLANEOUS) IMPLANT
PAD ELECT DEFIB RADIOL ZOLL (MISCELLANEOUS) ×3 IMPLANT
PENCIL BUTTON HOLSTER BLD 10FT (ELECTRODE) IMPLANT
POSITIONER HEAD DONUT 9IN (MISCELLANEOUS) IMPLANT
PUNCH AORTIC ROTATE 4.0MM (MISCELLANEOUS) IMPLANT
PUNCH AORTIC ROTATE 4.5MM 8IN (MISCELLANEOUS) IMPLANT
PUNCH AORTIC ROTATE 5MM 8IN (MISCELLANEOUS) IMPLANT
SET CARDIOPLEGIA MPS 5001102 (MISCELLANEOUS) ×3 IMPLANT
SPONGE INTESTINAL PEANUT (DISPOSABLE) IMPLANT
SPONGE LAP 18X18 RF (DISPOSABLE) IMPLANT
SPONGE LAP 4X18 RFD (DISPOSABLE) ×3 IMPLANT
SUT BONE WAX W31G (SUTURE) ×3 IMPLANT
SUT ETHIBON 2 0 V 52N 30 (SUTURE) ×6 IMPLANT
SUT ETHIBON EXCEL 2-0 V-5 (SUTURE) ×6 IMPLANT
SUT ETHIBOND V-5 VALVE (SUTURE) ×3 IMPLANT
SUT MNCRL AB 4-0 PS2 18 (SUTURE) IMPLANT
SUT PROLENE 3 0 SH DA (SUTURE) IMPLANT
SUT PROLENE 3 0 SH1 36 (SUTURE) ×3 IMPLANT
SUT PROLENE 4 0 RB 1 (SUTURE) ×4
SUT PROLENE 4 0 SH DA (SUTURE) IMPLANT
SUT PROLENE 4-0 RB1 .5 CRCL 36 (SUTURE) ×8 IMPLANT
SUT PROLENE 5 0 C 1 36 (SUTURE) IMPLANT
SUT PROLENE 6 0 C 1 30 (SUTURE) IMPLANT
SUT PROLENE 7 0 BV 1 (SUTURE) IMPLANT
SUT PROLENE 7 0 BV1 MDA (SUTURE) IMPLANT
SUT PROLENE 8 0 BV175 6 (SUTURE) ×3 IMPLANT
SUT SILK  1 MH (SUTURE)
SUT SILK 1 MH (SUTURE) IMPLANT
SUT SILK 2 0 SH (SUTURE) IMPLANT
SUT STEEL 6MS V (SUTURE) ×6 IMPLANT
SUT STEEL STERNAL CCS#1 18IN (SUTURE) IMPLANT
SUT STEEL SZ 6 DBL 3X14 BALL (SUTURE) IMPLANT
SUT VIC AB 1 CTX 36 (SUTURE) ×2
SUT VIC AB 1 CTX36XBRD ANBCTR (SUTURE) ×4 IMPLANT
SUT VIC AB 2-0 CT1 27 (SUTURE)
SUT VIC AB 2-0 CT1 TAPERPNT 27 (SUTURE) IMPLANT
SUT VIC AB 2-0 CTX 27 (SUTURE) IMPLANT
SUT VIC AB 3-0 SH 27 (SUTURE)
SUT VIC AB 3-0 SH 27X BRD (SUTURE) IMPLANT
SUT VIC AB 3-0 X1 27 (SUTURE) IMPLANT
SUT VICRYL 4-0 PS2 18IN ABS (SUTURE) IMPLANT
SYR 5ML LUER SLIP (SYRINGE) ×3 IMPLANT
SYSTEM SAHARA CHEST DRAIN ATS (WOUND CARE) ×3 IMPLANT
TAPE CLOTH SURG 4X10 WHT LF (GAUZE/BANDAGES/DRESSINGS) ×3 IMPLANT
TAPE PAPER 2X10 WHT MICROPORE (GAUZE/BANDAGES/DRESSINGS) ×3 IMPLANT
TOWEL GREEN STERILE (TOWEL DISPOSABLE) ×3 IMPLANT
TOWEL GREEN STERILE FF (TOWEL DISPOSABLE) IMPLANT
TRAY FOLEY SLVR 16FR TEMP STAT (SET/KITS/TRAYS/PACK) ×3 IMPLANT
TUBING LAP HI FLOW INSUFFLATIO (TUBING) IMPLANT
UNDERPAD 30X30 (UNDERPADS AND DIAPERS) ×3 IMPLANT
VALVE AORTIC SZ23 INSP/RESIL (Prosthesis & Implant Heart) ×3 IMPLANT
VENT LEFT HEART 12002 (CATHETERS) ×3
WATER STERILE IRR 1000ML POUR (IV SOLUTION) ×6 IMPLANT

## 2019-11-24 NOTE — Anesthesia Procedure Notes (Signed)
Anesthesia Procedure Image    

## 2019-11-24 NOTE — Anesthesia Procedure Notes (Addendum)
Procedure Name: Intubation Date/Time: 11/24/2019 7:48 AM Performed by: Lavell Luster, CRNA Pre-anesthesia Checklist: Patient identified, Emergency Drugs available, Suction available and Patient being monitored Patient Re-evaluated:Patient Re-evaluated prior to induction Oxygen Delivery Method: Circle System Utilized Preoxygenation: Pre-oxygenation with 100% oxygen Induction Type: IV induction Ventilation: Mask ventilation without difficulty and Oral airway inserted - appropriate to patient size Laryngoscope Size: Mac and 4 Grade View: Grade II Tube type: Oral Tube size: 8.0 mm Number of attempts: 1 Airway Equipment and Method: Stylet and Oral airway Placement Confirmation: ETT inserted through vocal cords under direct vision,  positive ETCO2 and breath sounds checked- equal and bilateral Secured at: 22 cm Tube secured with: Tape Dental Injury: Teeth and Oropharynx as per pre-operative assessment  Comments: Intubated by Orma Flaming, SRNA

## 2019-11-24 NOTE — Anesthesia Procedure Notes (Signed)
Arterial Line Insertion Start/End12/02/2019 6:40 AM, 11/24/2019 6:55 AM Performed by: Albertha Ghee, MD, Lavell Luster, CRNA, CRNA  Preanesthetic checklist: patient identified, IV checked, site marked, risks and benefits discussed, surgical consent, monitors and equipment checked, pre-op evaluation, timeout performed and anesthesia consent Lidocaine 1% used for infiltration and patient sedated Left, radial was placed Catheter size: 20 G Hand hygiene performed  and maximum sterile barriers used   Attempts: 1 Procedure performed without using ultrasound guided technique. Following insertion, dressing applied and Biopatch. Post procedure assessment: normal  Patient tolerated the procedure well with no immediate complications. Additional procedure comments: Inserted by Orma Flaming, SRNA under supervision.

## 2019-11-24 NOTE — Progress Notes (Signed)
Weaning protocol started at 1650. Pt is alert to voice, oriented, able to follow commands, and performed all exercises appropriately. Pt was extubated on placed on 4L [place]. Pt's breathing is even and unlabored, pt able to cough, speak, and perform IS. Will continue to monitor.

## 2019-11-24 NOTE — Progress Notes (Signed)
  Echocardiogram Echocardiogram Transesophageal has been performed.  Gregory [name] 11/24/2019, 9:02 AM

## 2019-11-24 NOTE — Interval H&P Note (Signed)
History and Physical Interval Note:  11/24/2019 7:21 AM  Gregory [name]  has presented today for surgery, with the diagnosis of AS CAD.  The various methods of treatment have been discussed with the patient and family. After consideration of risks, benefits and other options for treatment, the patient has consented to  Procedure(s): AORTIC VALVE REPLACEMENT (AVR) (N/A) CORONARY ARTERY BYPASS GRAFTING (CABG) (N/A) TRANSESOPHAGEAL ECHOCARDIOGRAM (TEE) (N/A) as a surgical intervention.  The patient's history has been reviewed, patient examined, no change in status, stable for surgery.  I have reviewed the patient's chart and labs.  Questions were answered to the patient's satisfaction.     Gaye Pollack

## 2019-11-24 NOTE — Plan of Care (Signed)
  Problem: Education: Goal: Knowledge of General Education information will improve Description: Including pain rating scale, medication(s)/side effects and non-pharmacologic comfort measures Outcome: Progressing   Problem: Clinical Measurements: Goal: Cardiovascular complication will be avoided Outcome: Progressing   Problem: Elimination: Goal: Will not experience complications related to urinary retention Outcome: Progressing   Problem: Safety: Goal: Ability to remain free from injury will improve Outcome: Progressing   Problem: Cardiac: Goal: Will achieve and/or maintain hemodynamic stability Outcome: Progressing   Problem: Clinical Measurements: Goal: Postoperative complications will be avoided or minimized Outcome: Progressing   Problem: Respiratory: Goal: Respiratory status will improve Outcome: Progressing   Problem: Urinary Elimination: Goal: Ability to achieve and maintain adequate renal perfusion and functioning will improve Outcome: Progressing

## 2019-11-24 NOTE — Anesthesia Procedure Notes (Signed)
Central Venous Catheter Insertion Performed by: Myrtie Soman, MD, anesthesiologist Start/End12/02/2019 6:25 AM, 11/24/2019 6:42 AM Patient location: Pre-op. Preanesthetic checklist: patient identified, IV checked, site marked, risks and benefits discussed, surgical consent, monitors and equipment checked, pre-op evaluation, timeout performed and anesthesia consent Position: Trendelenburg Lidocaine 1% used for infiltration and patient sedated Hand hygiene performed  and maximum sterile barriers used  Catheter size: 9 Fr PA cath was placed.MAC introducer PA Cath depth:47 Procedure performed using ultrasound guided technique. Ultrasound Notes:anatomy identified, needle tip was noted to be adjacent to the nerve/plexus identified, no ultrasound evidence of intravascular and/or intraneural injection and image(s) printed for medical record Attempts: 1 Following insertion, line sutured, dressing applied and Biopatch. Post procedure assessment: blood return through all ports, free fluid flow and no air  Patient tolerated the procedure well with no immediate complications.

## 2019-11-24 NOTE — Procedures (Signed)
Extubation Procedure Note  Patient Details:   Name: Gregory [name] DOB: Jan 19, 1951 MRN: [id number]   Airway Documentation:  Airway 8 mm (Active)  Secured at (cm) 22 cm 11/24/19 1600  Measured From Lips 11/24/19 1600  Secured Location Right 11/24/19 1336  Secured By [employer] Tape 11/24/19 1600  Site Condition Cool;Dry 11/24/19 1400   Vent end date: (not recorded) Vent end time: (not recorded)   Evaluation  O2 sats: stable throughout Complications: No apparent complications Patient did tolerate procedure well. Bilateral Breath Sounds: Diminished, Rhonchi   Yes   Patient extubated per protocol to 4L [place] with no apparent complications. Positive cuff leak was noted prior to extubation. Patient achieved NIF of greater than -40 and VC of 1.1L with good effort. Patient is alert and is able to speak. Vitals are stable. RT will continue to monitor.   Rupal Childress Clyda Greener 11/24/2019, 6:01 PM

## 2019-11-24 NOTE — Progress Notes (Signed)
This note also relates to the following rows which could not be included: SpO2 - Cannot attach notes to unvalidated device data  Rapid weaning protocol  

## 2019-11-24 NOTE — Transfer of Care (Signed)
Immediate Anesthesia Transfer of Care Note  Patient: Gregory [name]  Procedure(s) Performed: AORTIC VALVE REPLACEMENT (AVR) using INSPIRIS Resilia 23 MM Bioprosthetic Aortic Valve. (N/A Chest) CORONARY ARTERY BYPASS GRAFTING (CABG) using LIMA to LAD. (N/A Chest) TRANSESOPHAGEAL ECHOCARDIOGRAM (TEE) (N/A )  Patient Location: SICU  Anesthesia Type:General  Level of Consciousness: sedated and Patient remains intubated per anesthesia plan  Airway & Oxygen Therapy: Patient remains intubated per anesthesia plan and Patient placed on Ventilator (see vital sign flow sheet for setting)  Post-op Assessment: Post -op Vital signs reviewed and stable  Post vital signs: stable  Last Vitals:  Vitals Value Taken Time  BP    Temp    Pulse    Resp    SpO2      Last Pain:  Vitals:   11/24/19 0600  TempSrc: Oral  PainSc: 0-No pain         Complications: No apparent anesthesia complications

## 2019-11-24 NOTE — Op Note (Signed)
CARDIOVASCULAR SURGERY OPERATIVE NOTE  11/24/2019  Surgeon:  Gaye Pollack, MD  First Assistant: Lars Pinks,  PA-C   Preoperative Diagnosis:  Severe single-vessel coronary artery disease and severe aortic stenosis.   Postoperative Diagnosis:  Same   Procedure:  1. Median Sternotomy 2. Extracorporeal circulation 3.   Coronary artery bypass grafting x 1   Left internal mammary graft to the LAD    4.   Aortic valve replacement using a 23 mm Edwards INSPIRIS RESILIA pericardial valve   Anesthesia:  General Endotracheal   Clinical History/Surgical Indication:  This 68 year old gentleman has stage C, asymptomatic, severe aortic stenosis with 99% stenosis of the proximal to mid LAD. I have personally reviewed his 2D echocardiogram, cardiac catheterization, and CTA studies. His echocardiogram shows a trileaflet aortic valve with severe thickening and calcification with restricted leaflet mobility. The mean gradient across the aortic valve was measured at 50 mmHg consistent with severe aortic stenosis. Left ventricular systolic function is normal. Cardiac catheterization shows a 99% proximal to mid LAD stenosis over a long segment with otherwise widely patent coronary arteries. I agree that the best treatment for this gentleman is open surgical aortic valve replacement and coronary artery bypass grafting using a left internal mammary graft to the LAD. His coronary stenosis is not favorable for PCI and his aortic valve shows bulky calcification both of which make open surgical treatment the best option. I would plan to use a bioprosthetic valve given his age. I reviewed the echocardiogram and cardiac catheterization images with him and his wife and answered all their questions. I discussed the operative procedure with the patient and family including alternatives, benefits and risks;  including but not limited to bleeding, blood transfusion, infection, stroke, myocardial infarction, graft failure, heart block requiring a permanent pacemaker, organ dysfunction, and death. Gregory [name] understands and agrees to proceed.    Preparation:  The patient was seen in the preoperative holding area and the correct patient, correct operation were confirmed with the patient after reviewing the medical record and catheterization. The consent was signed by me. Preoperative antibiotics were given. A pulmonary arterial line and radial arterial line were placed by the anesthesia team. The patient was taken back to the operating room and positioned supine on the operating room table. After being placed under general endotracheal anesthesia by the anesthesia team a foley catheter was placed. The neck, chest, abdomen, and both legs were prepped with betadine soap and solution and draped in the usual sterile manner. A surgical time-out was taken and the correct patient and operative procedure were confirmed with the nursing and anesthesia staff.   Cardiopulmonary Bypass:  A median sternotomy was performed. The pericardium was opened in the midline. Right ventricular function appeared normal. The ascending aorta was of normal size and had no palpable plaque. There were no contraindications to aortic cannulation or cross-clamping. The patient was fully systemically heparinized and the ACT was maintained > 400 sec. The proximal aortic arch was cannulated with a 20 F aortic cannula for arterial inflow. Venous cannulation was performed via the right atrial appendage using a two-staged venous cannula. An antegrade cardioplegia/vent cannula was inserted into the mid-ascending aorta. An antegrade cardioplegia cannula was placed in the coronary sinus via the right atrium. A left ventricular vent was placed via the right superior pulmonary vein.  Aortic occlusion was performed with a single cross-clamp.  Systemic cooling to 32 degrees Centigrade and topical cooling of the heart with iced saline were used. Hyperkalemic antegrade  cold blood cardioplegia was used to induce diastolic arrest and then cold blood retrograde cardioplegia was given at about 20 minute intervals throughout the period of arrest to maintain myocardial temperature at or below 10 degrees centigrade. A temperature probe was inserted into the interventricular septum and an insulating pad was placed in the pericardium.   Left internal mammary harvest:  The left side of the sternum was retracted using the Rultract retractor. The left internal mammary artery was harvested as a pedicle graft. All side branches were clipped. It was a medium-sized vessel of good quality with excellent blood flow. It was ligated distally and divided. It was sprayed with topical papaverine solution to prevent vasospasm.   Coronary arteries:  The coronary arteries were examined.   LAD:  Large vessel with no distal disease  LCX:  No disease  RCA:  No disease   Grafts:  1. LIMA to the LAD: 2.5 mm. It was sewn end to side using 8-0 prolene continuous suture.   Aortic Valve Replacement:      A transverse aortotomy was performed 1 cm above the take-off of the right coronary artery. The native valve was tricuspid with severely calcified leaflets and severe annular calcification which was bulky. The ostia of the coronary arteries were in normal position and were not obstructed. The native valve leaflets were excised and the annulus was decalcified with rongeurs. There was calcium extending into the septum in the area of the AV node. Care was taken to remove all particulate debris. The left ventricle was directly inspected for debris and then irrigated with ice saline solution. The annulus was sized and a size 23 mm Edwards INSPIRIS RESILIA pericardial valve was chosen. The model number was 11500A and the serial number was [id number].  While the valve was  being prepared 2-0 Ethibond pledgeted horizontal mattress sutures were placed around the annulus with the pledgets in a sub-annular position. The sutures were placed through the sewing ring and the valve lowered into place. The sutures were tied sequentially. The valve seated nicely and the coronary ostia were not obstructed. The prosthetic valve leaflets moved normally and there was no sub-valvular obstruction. The aortotomy was closed using 4-0 Prolene suture in 2 layers with felt strips to reinforce the closure.   Completion:  The patient was rewarmed to 37 degrees Centigrade. The clamp was removed from the LIMA pedicle and there was rapid warming of the septum and return of ventricular fibrillation. The crossclamp was removed with a time of 118 minutes. There was spontaneous return of sinus rhythm. The distal and proximal anastomoses were checked for hemostasis. The position of the grafts was satisfactory. Two temporary epicardial pacing wires were placed on the right atrium and two on the right ventricle. The patient was weaned from CPB without difficulty on no inotropes. CPB time was 135 minutes. Cardiac output was 6 LPM. TEE showed a normally functioning prosthetic valve with no paravalvular leak. Heparin was fully reversed with protamine and the aortic and venous cannulas removed. Hemostasis was achieved. Mediastinal and left pleural drainage tubes were placed. The sternum was closed with  #6 stainless steel wires. The fascia was closed with continuous # 1 vicryl suture. The subcutaneous tissue was closed with 2-0 vicryl continuous suture. The skin was closed with 3-0 vicryl subcuticular suture. All sponge, needle, and instrument counts were reported correct at the end of the case. Dry sterile dressings were placed over the incisions and around the chest tubes which were connected to pleurevac suction.  The patient was then transported to the surgical intensive care unit in stable condition.

## 2019-11-24 NOTE — Brief Op Note (Signed)
11/24/2019  12:00 PM  PATIENT:  Gregory [name]  68 y.o. male  PRE-OPERATIVE DIAGNOSIS: 1. SINGLE VESSEL CORONARY ARTERY DISEASE 2. SEVEREAORTIC STENOSIS  POST-OPERATIVE DIAGNOSIS:  1. SINGLE VESSEL CORONARY ARTERY DISEASE 2. [place] ECHOCARDIOGRAM (TEE),  MEDIAN STERNOTOMY for CORONARY ARTERY BYPASS GRAFTING (CABG) x 1  (LIMA to LAD) using LEFT INTERNAL MAMMAEY AORTIC VALVE REPLACEMENT (AVR) ( using INSPIRIS Resilia Model # 11500A, Serial # [id number], Size # 23 MM Bioprosthetic Aortic Valve)   SURGEON:  Surgeon(s) and Role:    Gaye Pollack, MD - Primary  PHYSICIAN ASSISTANT: Lars Pinks PA-C  ASSISTANTS: Dineen Kid RNFA  ANESTHESIA:   general  EBL:  Per anesthesia and perfusion records  DRAINS: Chest tubes placed in the mediastinal and pleural spaces   SPECIMEN:  Source of Specimen:  Native AV leaflets  DISPOSITION OF SPECIMEN:  PATHOLOGY  COUNTS CORRECT:  YES  DICTATION: .Dragon Dictation  PLAN OF CARE: Admit to inpatient   PATIENT DISPOSITION:  ICU - intubated and hemodynamically stable.   Delay start of Pharmacological VTE agent (>24hrs) due to surgical blood loss or risk of bleeding: yes  BASELINE WEIGHT: 66.5 kg

## 2019-11-24 NOTE — Progress Notes (Signed)
      [address]       [place] [address]             [phone]      S/p AVR, CABG x 1  BP 108/67   Pulse 80   Temp 99.3 F (37.4 C)   Resp 13   Ht 5' 5.5" (1.664 m)   Wt 66.5 kg   SpO2 100%   BMI 24.02 kg/m   Intake/Output Summary (Last 24 hours) at 11/24/2019 1737 Last data filed at 11/24/2019 1700 Gross per 24 hour  Intake 2777.77 ml  Output 2590 ml  Net 187.77 ml  Neuro intact Doing mechanics for extubation currently  Hct= 32  Steven C. Roxan Hockey, MD [facility] [phone]

## 2019-11-25 ENCOUNTER — Inpatient Hospital Stay (HOSPITAL_COMMUNITY): Payer: PPO

## 2019-11-25 ENCOUNTER — Encounter (HOSPITAL_COMMUNITY): Payer: Self-pay | Admitting: Surgery

## 2019-11-25 DIAGNOSIS — Z951 Presence of aortocoronary bypass graft: Secondary | ICD-10-CM | POA: Diagnosis not present

## 2019-11-25 DIAGNOSIS — Z953 Presence of xenogenic heart valve: Secondary | ICD-10-CM

## 2019-11-25 DIAGNOSIS — I442 Atrioventricular block, complete: Secondary | ICD-10-CM

## 2019-11-25 DIAGNOSIS — I350 Nonrheumatic aortic (valve) stenosis: Secondary | ICD-10-CM | POA: Diagnosis not present

## 2019-11-25 DIAGNOSIS — I739 Peripheral vascular disease, unspecified: Secondary | ICD-10-CM | POA: Diagnosis not present

## 2019-11-25 LAB — BASIC METABOLIC PANEL
Anion gap: 8 (ref 5–15)
Anion gap: 8 (ref 5–15)
BUN: 13 mg/dL (ref 8–23)
BUN: 18 mg/dL (ref 8–23)
CO2: 24 mmol/L (ref 22–32)
CO2: 26 mmol/L (ref 22–32)
Calcium: 7.7 mg/dL — ABNORMAL LOW (ref 8.9–10.3)
Calcium: 8.1 mg/dL — ABNORMAL LOW (ref 8.9–10.3)
Chloride: 104 mmol/L (ref 98–111)
Chloride: 100 mmol/L (ref 98–111)
Creatinine, Ser: 0.72 mg/dL (ref 0.61–1.24)
Creatinine, Ser: 0.82 mg/dL (ref 0.61–1.24)
GFR calc Af Amer: >60 mL/min (ref >60)
GFR calc Af Amer: >60 mL/min (ref >60)
GFR calc non Af Amer: >60 mL/min (ref >60)
GFR calc non Af Amer: >60 mL/min (ref >60)
Glucose, Bld: 125 mg/dL — ABNORMAL HIGH (ref 70–99)
Glucose, Bld: 155 mg/dL — ABNORMAL HIGH (ref 70–99)
Potassium: 4.8 mmol/L (ref 3.5–5.1)
Potassium: 4.2 mmol/L (ref 3.5–5.1)
Sodium: 136 mmol/L (ref 135–145)
Sodium: 134 mmol/L — ABNORMAL LOW (ref 135–145)

## 2019-11-25 LAB — ECHO INTRAOPERATIVE TEE
Height: 65.5 in
Weight: 2345.6 oz

## 2019-11-25 LAB — CBC
HCT: 32.3 % — ABNORMAL LOW (ref 39.0–52.0)
HCT: 32.2 % — ABNORMAL LOW (ref 39.0–52.0)
Hemoglobin: 10.9 g/dL — ABNORMAL LOW (ref 13.0–17.0)
Hemoglobin: 10.8 g/dL — ABNORMAL LOW (ref 13.0–17.0)
MCH: 31.1 pg (ref 26.0–34.0)
MCH: 31.1 pg (ref 26.0–34.0)
MCHC: 33.7 g/dL (ref 30.0–36.0)
MCHC: 33.5 g/dL (ref 30.0–36.0)
MCV: 92.3 fL (ref 80.0–100.0)
MCV: 92.8 fL (ref 80.0–100.0)
Platelets: 91 10*3/uL — ABNORMAL LOW (ref 150–400)
Platelets: 88 10*3/uL — ABNORMAL LOW (ref 150–400)
RBC: 3.5 MIL/uL — ABNORMAL LOW (ref 4.22–5.81)
RBC: 3.47 MIL/uL — ABNORMAL LOW (ref 4.22–5.81)
RDW: 14 % (ref 11.5–15.5)
RDW: 14.1 % (ref 11.5–15.5)
WBC: 8.4 10*3/uL (ref 4.0–10.5)
WBC: 9.8 10*3/uL (ref 4.0–10.5)
nRBC: 0 % (ref 0.0–0.2)
nRBC: 0 % (ref 0.0–0.2)

## 2019-11-25 LAB — MAGNESIUM
Magnesium: 2.6 mg/dL — ABNORMAL HIGH (ref 1.7–2.4)
Magnesium: 2.7 mg/dL — ABNORMAL HIGH (ref 1.7–2.4)

## 2019-11-25 LAB — GLUCOSE, CAPILLARY
Glucose-Capillary: 111 mg/dL — ABNORMAL HIGH (ref 70–99)
Glucose-Capillary: 115 mg/dL — ABNORMAL HIGH (ref 70–99)
Glucose-Capillary: 127 mg/dL — ABNORMAL HIGH (ref 70–99)
Glucose-Capillary: 129 mg/dL — ABNORMAL HIGH (ref 70–99)
Glucose-Capillary: 140 mg/dL — ABNORMAL HIGH (ref 70–99)
Glucose-Capillary: 160 mg/dL — ABNORMAL HIGH (ref 70–99)

## 2019-11-25 LAB — SURGICAL PATHOLOGY

## 2019-11-25 MED ORDER — AMLODIPINE BESYLATE 5 MG PO TABS
5.0000 mg | ORAL_TABLET | Freq: Every day | ORAL | Status: DC
  Administered 2019-11-25 – 2019-11-27 (×3): 5 mg via ORAL
  Filled 2019-11-25 (×3): qty 1

## 2019-11-25 MED ORDER — TRAMADOL HCL 50 MG PO TABS
50.0000 mg | ORAL_TABLET | Freq: Four times a day (QID) | ORAL | Status: DC | PRN
  Administered 2019-11-25 – 2019-11-26 (×2): 50 mg via ORAL
  Filled 2019-11-25 (×2): qty 1

## 2019-11-25 MED ORDER — FUROSEMIDE 10 MG/ML IJ SOLN
20.0000 mg | Freq: Two times a day (BID) | INTRAMUSCULAR | Status: AC
  Administered 2019-11-25 (×2): 20 mg via INTRAVENOUS
  Filled 2019-11-25 (×2): qty 2

## 2019-11-25 MED ORDER — LISINOPRIL 5 MG PO TABS
5.0000 mg | ORAL_TABLET | Freq: Every day | ORAL | Status: DC
  Administered 2019-11-25: 5 mg via ORAL
  Filled 2019-11-25: qty 1

## 2019-11-25 MED ORDER — INSULIN ASPART 100 UNIT/ML ~~LOC~~ SOLN
0.0000 [IU] | SUBCUTANEOUS | Status: DC
  Administered 2019-11-25 – 2019-11-26 (×5): 2 [IU] via SUBCUTANEOUS

## 2019-11-25 NOTE — Discharge Instructions (Signed)

## 2019-11-25 NOTE — Anesthesia Postprocedure Evaluation (Signed)
Anesthesia Post Note  Patient: Gregory [name]  Procedure(s) Performed: AORTIC VALVE REPLACEMENT (AVR) using INSPIRIS Resilia 23 MM Bioprosthetic Aortic Valve. (N/A Chest) CORONARY ARTERY BYPASS GRAFTING (CABG) using LIMA to LAD. (N/A Chest) TRANSESOPHAGEAL ECHOCARDIOGRAM (TEE) (N/A )     Patient location during evaluation: SICU Anesthesia Type: General Level of consciousness: sedated Pain management: pain level controlled Vital Signs Assessment: post-procedure vital signs reviewed and stable Respiratory status: patient remains intubated per anesthesia plan Cardiovascular status: stable Postop Assessment: no apparent nausea or vomiting Anesthetic complications: no    Last Vitals:  Vitals:   11/25/19 1700 11/25/19 1800  BP: (!) 171/78 (!) 141/73  Pulse: 83 77  Resp: (!) 21 17  Temp:    SpO2: 93% 90%    Last Pain:  Vitals:   11/25/19 1500  TempSrc: Oral  PainSc:                  [name]

## 2019-11-25 NOTE — Progress Notes (Signed)
1 Day Post-Op Procedure(s) (LRB): AORTIC VALVE REPLACEMENT (AVR) using INSPIRIS Resilia 23 MM Bioprosthetic Aortic Valve. (N/A) CORONARY ARTERY BYPASS GRAFTING (CABG) using LIMA to LAD. (N/A) TRANSESOPHAGEAL ECHOCARDIOGRAM (TEE) (N/A) Subjective: No complaints Objective: Vital signs in last 24 hours: Temp:  [97.5 F (36.4 C)-99.5 F (37.5 C)] 99.5 F (37.5 C) (12/04 1500) Pulse Rate:  [68-83] 77 (12/04 1800) Cardiac Rhythm: A-V Sequential paced (12/04 0900) Resp:  [11-23] 17 (12/04 1800) BP: (89-171)/(59-83) 141/73 (12/04 1800) SpO2:  [90 %-100 %] 90 % (12/04 1800) Arterial Line BP: (96-162)/(47-67) 131/53 (12/04 0900) Weight:  [68.2 kg] 68.2 kg (12/04 0700)  Hemodynamic parameters for last 24 hours: PAP: (23-48)/(9-24) 27/18 CO:  [4.5 L/min-4.6 L/min] 4.5 L/min CI:  [2 L/min/m2-2.7 L/min/m2] 2 L/min/m2  Intake/Output from previous day: 12/03 0701 - 12/04 0700 In: 4762 [P.O.:120; I.V.:2826.8; Blood:420; IV Piggyback:1395.2] Out: [id number] [Urine:3270; Chest Tube:560] Intake/Output this shift: Total I/O In: 807.2 [P.O.:660; I.V.:47.2; IV Piggyback:100] Out: 485 [Urine:385; Chest Tube:100]  General appearance: alert and cooperative Neurologic: intact Heart: regular rate and rhythm, S1, S2 normal, no murmur, click, rub or gallop  Lab Results: Recent Labs    11/25/19 0343 11/25/19 1623  WBC 8.4 9.8  HGB 10.9* 10.8*  HCT 32.3* 32.2*  PLT 91* 88*   BMET:  Recent Labs    11/25/19 0343 11/25/19 1623  NA 136 134*  K 4.8 4.2  CL 104 100  CO2 24 26  GLUCOSE 125* 155*  BUN 13 18  CREATININE 0.72 0.82  CALCIUM 7.7* 8.1*    PT/INR:  Recent Labs    11/24/19 1347  LABPROT 18.0*  INR 1.5*   ABG    Component Value Date/Time   PHART 7.349 (L) 11/24/2019 1845   HCO3 24.2 11/24/2019 1845   TCO2 26 11/24/2019 1845   ACIDBASEDEF 1.0 11/24/2019 1845   O2SAT 99.0 11/24/2019 1845   CBG (last 3)  Recent Labs    11/25/19 0829 11/25/19 1127 11/25/19 1610  GLUCAP 111*  127* 140*    Assessment/Plan: S/P Procedure(s) (LRB): AORTIC VALVE REPLACEMENT (AVR) using INSPIRIS Resilia 23 MM Bioprosthetic Aortic Valve. (N/A) CORONARY ARTERY BYPASS GRAFTING (CABG) using LIMA to LAD. (N/A) TRANSESOPHAGEAL ECHOCARDIOGRAM (TEE) (N/A) start lisinopril for HTN   LOS: 1 day    Wonda Olds 11/25/2019

## 2019-11-25 NOTE — Progress Notes (Addendum)
Did fluid response test with 250cc albumin over 39min, as pt's BP was trending down along with PAPs. SV increased by 15%, showing positive response. Will follow up with PRN LR bolus.  04:37 AM  Addendum: [id number] LR bolus only elicited a 3% increase in SV, indicating pt did not respond as much to that fluid. Due to this, will restart Neo for low BP.   5:04 AM

## 2019-11-25 NOTE — Discharge Summary (Signed)
Physician Discharge Summary  [name] ID: Gregory [name] MRN: [id number] DOB/AGE: 1951/04/25 68 y.o.  Admit date: 11/24/2019 Discharge date: 11/30/2019  Admission Diagnoses:  [name] Active Problem List   Diagnosis Date Noted  . Aortic stenosis, severe 11/24/2019  . PAD (peripheral artery disease) ([place])   . Mild persistent asthma 10/29/2019  . Herpes zoster without complication 99991111  . 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 Peripheral neuropathy   . S/P lumbar spinal fusion 10/25/2014  . Recovering alcoholic in remission ([place]) 08/10/2014  . Substance induced mood disorder ([place]) 06/30/2014  . Substance-induced sleep disorder ([place]) 06/30/2014  . Obsessive compulsive disorder   . OSA (obstructive sleep apnea) 01/20/2014  . CAD (coronary artery disease)   . Severe aortic stenosis   . Mentally disabled 11/25/2012  . Hyperlipidemia with target LDL less than 130 11/25/2012  . Bipolar disorder ([place])   . Hypertension   . Allergic rhinitis due to pollen 08/11/2011  . Tobacco abuse    Discharge Diagnoses:   [name] Active Problem List   Diagnosis Date Noted  . S/P CABG x 1 11/24/2019  . S/P aortic valve replacement with bioprosthetic valve 11/24/2019  . Aortic stenosis, severe 11/24/2019  . PAD (peripheral artery disease) ([place])   . Mild persistent asthma 10/29/2019  . Herpes zoster without complication 99991111  . 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 Peripheral neuropathy   . S/P lumbar spinal fusion 10/25/2014  . Recovering alcoholic in remission ([place]) 08/10/2014  . Substance induced mood disorder ([place]) 06/30/2014  . Substance-induced sleep disorder ([place]) 06/30/2014  . Obsessive compulsive disorder   . OSA (obstructive sleep apnea) 01/20/2014  . CAD (coronary artery disease)   . Severe aortic stenosis   . Mentally disabled 11/25/2012  . Hyperlipidemia with target LDL less than 130 11/25/2012  .  Bipolar disorder ([place])   . Hypertension   . Allergic rhinitis due to pollen 08/11/2011  . Tobacco abuse    Discharged Condition: good  History of Present Illness:  Gregory [name] is a 68 yo white male with known history of hypertension, hyperlipidemia, coronary artery disease, peripheral vascular disease, spinal stenosis with chronic back pain and lower extremity pain with limited mobility, bipolar disorder, alcoholism in recovery, chronic right bundle branch block and left anterior fascicular block, and aortic stenosis.  He has been followed by Dr. [name] and his PCP.  His most recent Echocardiogram showed progression of his aortic stenosis with preserved LV function.  He underwent cardiac catheterization on 11/10/2019 which showed critical single vessel CAD.  It was felt surgical intervention would be warranted and he was subsequently referred to [facility] for consultation.  He was originally evaluated by Dr. Roxy [name] who was in agreement Gregory [name] would benefit from surgical intervention.  However due to medical leave Gregory [name]'s surgery was rescheduled with Dr. Cyndia [name].  Gregory risks and benefits of Gregory procedure were explained to Gregory [name] and he was agreeable to proceed.  Hospital Course:   Mr. Gregory [name] presented to [facility] on 11/24/2019.  He was taken to Gregory operating room and underwent CABG x 1 and AVR.  He tolerated Gregory procedure without difficulty and was taken to Gregory SICU in stable condition.  He was extubated Gregory evening of surgery.  During his stay in Gregory SICU Gregory patients chest tubes and arterial lines were removed without difficulty.  He was hypertensive and started on home  Norvasc  He required pacing as he was in CHB.  EP consult was requested and review of EKG showed CHB without evidence of escape rhythm.  They recommended continued observation and if conduction did return Gregory [name] would require a PPM.  However, subsequent EKGs showed recovery of  preoperative heart rhythm of NSR with complete RBBB.  He will not require PPM due to this recovery.  He will be kept off all nodal blocking agents.  They also recommend Gregory [name] have a ZIO AT monitor placed so they can monitor his conduction.  He was stable for transfer to Gregory progressive care unit on 11/28/2019.  Gregory [name] continues to make good progress.  He remains in NSR with complete RBBB.  His pacing wires were removed without difficulty.  He was hypertensive and was restarted on his home regimen of Norvasc and his Lisinopril was titrate to 40 mg daily.  He is ambulating independently.  His surgical incisions are healing without evidence of infection.  His ZIO AT has been arranged.  He is medically stable for discharge home today.      Consults: cardiology  Significant Diagnostic Studies: angiography:    Prox LAD to Mid LAD lesion is 99% stenosed.  Mid Cx to Dist Cx lesion is 50% stenosed.  Mid RCA lesion is 30% stenosed.  Gregory left ventricular systolic function is normal.  LV end diastolic pressure is normal.  Gregory left ventricular ejection fraction is 55-65% by visual estimate.  There is severe aortic valve stenosis.   1. Critical single vessel obstructive CAD with 99% mid LAD - heavily calcified 2. Severe aortic stenosis. Mean gradient 47 mm Hg. AVA 0.68 cm squared with index 0.39. 3. Normal LV filling pressures 4. Normal Right heart pressures 5. Normal cardiac output.  Treatments: surgery:   Preoperative Diagnosis:  Severe single-vessel coronary artery disease and severe aortic stenosis.   Postoperative Diagnosis:  Same   Procedure:  1. Median Sternotomy 2. Extracorporeal circulation 3.   Coronary artery bypass grafting x 1 Left internal mammary graft to Gregory LAD 4.   Aortic valve replacement using a 23 mm [place] INSPIRIS RESILIA pericardial valve  Discharge Exam: Blood pressure (!) 159/67, pulse 74, temperature 98.9 F (37.2 C), temperature source Oral,  resp. rate 16, height 5' 5.5" (1.664 m), weight 68.2 kg, SpO2 94 %.  General appearance: alert, cooperative and no distress Heart: regular rate and rhythm Lungs: coarse clears with cough Abdomen: soft, non-tender; bowel sounds normal; no masses,  no organomegaly Extremities: edema trace Wound: clean and dry  Discharge Medications:  Gregory [name] has been discharged on:   1.Beta Blocker:  Yes [   ]                              No   [ x  ]                              If No, reason: CHB  2.Ace Inhibitor/ARB: Yes [ X  ]                                     No  [    ]  If No, reason:  3.Statin:   Yes [ X  ]                  No  [   ]                  If No, reason:  4.Ecasa:  Yes  [ X ]                  No   [   ]                  If No, reason:     Allergies as of 11/30/2019      Reactions   Codeine Anaphylaxis      Medication List    STOP taking these medications   ibuprofen 200 MG tablet Commonly known as: ADVIL   metoprolol tartrate 25 MG tablet Commonly known as: LOPRESSOR     TAKE these medications   acetaminophen 325 MG tablet Commonly known as: TYLENOL Take 2 tablets (650 mg total) by mouth every 6 (six) hours as needed for mild pain.   albuterol 108 (90 Base) MCG/ACT inhaler Commonly known as: VENTOLIN HFA Inhale 2 puffs into Gregory lungs every 6 (six) hours as needed for wheezing.   amLODipine 10 MG tablet Commonly known as: NORVASC TAKE ONE TABLET BY MOUTH DAILY   amoxicillin 500 MG capsule Commonly known as: AMOXIL Take four capsules one hour before dental appointment.   aspirin 325 MG EC tablet Take 1 tablet (325 mg total) by mouth daily. What changed:   medication strength  how much to take   atorvastatin 80 MG tablet Commonly known as: LIPITOR Take 1 tablet (80 mg total) by mouth daily.   divalproex 500 MG DR tablet Commonly known as: DEPAKOTE Take 500 mg by mouth at bedtime. 2-3 tabs   finasteride  5 MG tablet Commonly known as: PROSCAR TAKE ONE TABLET BY MOUTH DAILY   fluticasone 50 MCG/ACT nasal spray Commonly known as: FLONASE SPRAY TWO SPRAYS IN Gregory AFFECTED NOSTRIL DAILY   Fluticasone-Salmeterol 500-50 MCG/DOSE Aepb Commonly known as: Advair Diskus Inhale 1 puff into Gregory lungs every 12 (twelve) hours.   lamoTRIgine 200 MG tablet Commonly known as: LAMICTAL Take 1 tablet (200 mg total) by mouth every morning.   lisinopril-hydrochlorothiazide 20-12.5 MG tablet Commonly known as: ZESTORETIC TAKE ONE TABLET BY MOUTH EVERY MORNING   MULTIVITAMIN ADULTS 50+ PO Take 1 tablet by mouth daily.   traMADol 50 MG tablet Commonly known as: ULTRAM Take 1 tablet (50 mg total) by mouth every 4 (four) hours as needed for moderate pain.      Follow-up Information    Gaye Pollack, MD Follow up on 12/28/2019.   Specialty: Cardiothoracic Surgery Why: Appointment is at 11:00, please get CXR at 10:30 at [facility] located on first floor of our office building Contact information: [place] [address] [phone]        Gregory [name], [name], MD. Go on 12/08/2019.   Specialty: Cardiology Why: @8 :40am for hospital follow up Contact information: [address] [address] [phone]        [facility] Follow up on 12/09/2019.   Specialty: Cardiothoracic Surgery Why: Appointment is at 10:00 for suture removal Contact information: [place], [place] [phone]          Signed: Ellwood Handler,  PA-C 11/30/2019, 8:09 AM

## 2019-11-25 NOTE — Progress Notes (Signed)
Progress Note  Patient Name: LUI WAGEMAN Date of Encounter: 11/25/2019  Primary Cardiologist: Peter [name] MD  Subjective   Awake, alert, extubated. Feels sore but otherwise doing well.   Inpatient Medications    Scheduled Meds: . acetaminophen  1,000 mg Oral Q6H   Or  . acetaminophen (TYLENOL) oral liquid 160 mg/5 mL  1,000 mg Per Tube Q6H  . amLODipine  5 mg Oral Daily  . aspirin EC  325 mg Oral Daily   Or  . aspirin  324 mg Per Tube Daily  . atorvastatin  80 mg Oral Daily  . bisacodyl  10 mg Oral Daily   Or  . bisacodyl  10 mg Rectal Daily  . Chlorhexidine Gluconate Cloth  6 each Topical Daily  . divalproex  500 mg Oral QHS  . docusate sodium  200 mg Oral Daily  . finasteride  5 mg Oral Daily  . fluticasone  2 spray Each Nare Daily  . furosemide  20 mg Intravenous BID  . insulin aspart  0-24 Units Subcutaneous Q4H  . lamoTRIgine  200 mg Oral q morning - 10a  . mouth rinse  15 mL Mouth Rinse BID  . mometasone-formoterol  2 puff Inhalation BID  . [START ON 11/26/2019] pantoprazole  40 mg Oral Daily  . sodium chloride flush  10-40 mL Intracatheter Q12H  . sodium chloride flush  3 mL Intravenous Q12H   Continuous Infusions: . sodium chloride Stopped (11/25/19 0659)  . sodium chloride    . sodium chloride    . albumin human Stopped (11/25/19 0425)  . cefUROXime (ZINACEF)  IV Stopped (11/25/19 0422)  . dexmedetomidine (PRECEDEX) IV infusion Stopped (11/25/19 0510)  . lactated ringers    . nitroGLYCERIN 5 mcg/min (11/25/19 0700)   PRN Meds: sodium chloride, albumin human, levalbuterol, morphine injection, ondansetron (ZOFRAN) IV, sodium chloride flush, sodium chloride flush   Vital Signs    Vitals:   11/25/19 0700 11/25/19 0715 11/25/19 0730 11/25/19 0745  BP: (!) 154/74     Pulse: 80 80 80 80  Resp: 20 17 15 15   Temp: 98.4 F (36.9 C) 98.4 F (36.9 C) 98.4 F (36.9 C) 98.2 F (36.8 C)  TempSrc:      SpO2: 100% 98% 98% 98%  Weight: 68.2 kg      Height: 5' 5.5" (1.664 m)       Intake/Output Summary (Last 24 hours) at 11/25/2019 0944 Last data filed at 11/25/2019 0700 Gross per 24 hour  Intake 4761.95 ml  Output 3655 ml  Net 1106.95 ml   Last 3 Weights 11/25/2019 11/24/2019 11/22/2019  Weight (lbs) 150 lb 5.7 oz 146 lb 9.6 oz 146 lb 2 oz  Weight (kg) 68.2 kg 66.497 kg 66.282 kg      Telemetry    Paced rhythm, underlying rhythm is CHB, brady - Personally Reviewed  ECG    none - Personally Reviewed  Physical Exam   GEN: No acute distress.   Neck: No JVD, right IJ in place.  Cardiac: RRR, no murmurs, rubs, or gallops. Good AV sound Respiratory: Clear to auscultation bilaterally. GI: Soft, nontender, non-distended  MS: No edema; No deformity. Neuro:  Nonfocal  Psych: Normal affect   Labs    High Sensitivity Troponin:  No results for input(s): TROPONINIHS in the last 720 hours.    Chemistry Recent Labs  Lab 11/22/19 1144  11/24/19 1219  11/24/19 1845 11/24/19 1934 11/25/19 0343  NA 140   < > 137   < >  140 138 136  K 4.4   < > 4.6   < > 4.8 4.9 4.8  CL 104   < > 102  --   --  109 104  CO2 20*  --   --   --   --  23 24  GLUCOSE 90   < > 129*  --   --  120* 125*  BUN 12   < > 12  --   --  10 13  CREATININE 0.87   < > 0.60*  --   --  0.80 0.72  CALCIUM 9.4  --   --   --   --  7.7* 7.7*  PROT 7.1  --   --   --   --   --   --   ALBUMIN 3.9  --   --   --   --   --   --   AST 30  --   --   --   --   --   --   ALT 38  --   --   --   --   --   --   ALKPHOS 48  --   --   --   --   --   --   BILITOT 0.6  --   --   --   --   --   --   GFRNONAA >60  --   --   --   --  >60 >60  GFRAA >60  --   --   --   --  >60 >60  ANIONGAP 16*  --   --   --   --  6 8   < > = values in this interval not displayed.     Hematology Recent Labs  Lab 11/24/19 1347  11/24/19 1845 11/24/19 1934 11/25/19 0343  WBC 8.4  --   --  8.2 8.4  RBC 3.53*  --   --  3.66* 3.50*  HGB 10.9*   < > 10.5* 11.4* 10.9*  HCT 31.9*   < > 31.0* 33.3*  32.3*  MCV 90.4  --   --  91.0 92.3  MCH 30.9  --   --  31.1 31.1  MCHC 34.2  --   --  34.2 33.7  RDW 13.7  --   --  13.8 14.0  PLT 87*  --   --  112* 91*   < > = values in this interval not displayed.    BNPNo results for input(s): BNP, PROBNP in the last 168 hours.   DDimer No results for input(s): DDIMER in the last 168 hours.   Radiology    Dg Chest Port 1 View  Result Date: 11/25/2019 CLINICAL DATA:  Dyspnea, status post aortic valve replacement EXAM: PORTABLE CHEST 1 VIEW COMPARISON:  Chest radiograph from one day prior. FINDINGS: Stable right internal jugular Swan-Ganz catheter with tip overlying the main pulmonary artery. Aortic valve prosthesis in place. Intact sternotomy wires. Stable mediastinal drains and left apical chest tube. Stable cardiomediastinal silhouette with mild cardiomegaly. No pneumothorax. No pleural effusion. No overt pulmonary edema. Mild bibasilar atelectasis is slightly worsened. IMPRESSION: 1. No pneumothorax.  Well-positioned support structures. 2. Stable mild cardiomegaly without overt pulmonary edema. 3. Mild bibasilar atelectasis, slightly worsened. Electronically Signed   By: Ilona Sorrel M.D.   On: 11/25/2019 08:20   Dg Chest Port 1 View  Result Date: 11/24/2019 CLINICAL DATA:  Status post CABG and  aortic valve replacement. EXAM: PORTABLE CHEST 1 VIEW COMPARISON:  Chest x-ray dated 11/22/2019 FINDINGS: Endotracheal tube in good position 7 cm above the carina. NG tube tip in the body of the stomach. Swan-Ganz catheter tip in the main pulmonary artery. Chest tubes in place. No pneumothorax. Heart size and vascularity are normal. Lungs are clear. IMPRESSION: Satisfactory postoperative appearance of the chest after CABG and aortic valve replacement. No pneumothorax. Aortic Atherosclerosis (ICD10-I70.0). Electronically Signed   By: Lorriane Shire M.D.   On: 11/24/2019 14:21    Cardiac Studies   Echo 10/06/19: IMPRESSIONS    1. Left ventricular ejection  fraction, by visual estimation, is 55 to 60%. The left ventricle has normal function. Normal left ventricular size. Left ventricular septal wall thickness was moderately increased. There is moderately increased left  ventricular hypertrophy.  2. Global right ventricle has normal systolic function.The right ventricular size is normal. No increase in right ventricular wall thickness.  3. Left atrial size was normal.  4. Right atrial size was normal.  5. Mild mitral annular calcification.  6. Moderate calcification of the mitral valve leaflet(s).  7. Moderate thickening of the mitral valve leaflet(s).  8. The mitral valve is normal in structure. Trace mitral valve regurgitation.  9. The tricuspid valve is normal in structure. Tricuspid valve regurgitation is mild. 10. The aortic valve has an indeterminant number of cusps Aortic valve regurgitation is mild by color flow Doppler. Severe aortic valve stenosis. 11. There is Severe calcifcation of the aortic valve. 12. There is Severely thickening of the aortic valve. 13. The pulmonic valve was grossly normal. Pulmonic valve regurgitation is mild by color flow Doppler.  Cardiac cath 11/22/19:  RIGHT/LEFT HEART CATH AND CORONARY ANGIOGRAPHY  Conclusion    Prox LAD to Mid LAD lesion is 99% stenosed.  Mid Cx to Dist Cx lesion is 50% stenosed.  Mid RCA lesion is 30% stenosed.  The left ventricular systolic function is normal.  LV end diastolic pressure is normal.  The left ventricular ejection fraction is 55-65% by visual estimate.  There is severe aortic valve stenosis.   1. Critical single vessel obstructive CAD with 99% mid LAD - heavily calcified 2. Severe aortic stenosis. Mean gradient 47 mm Hg. AVA 0.68 cm squared with index 0.39. 3. Normal LV filling pressures 4. Normal Right heart pressures 5. Normal cardiac output.  Plan: referral to CT surgery for combined AVR and CABG.       Patient Profile     68 y.o. male admitted  for CABG/AVR for severe AS and single vessel CAD. Also history of severe PAD, tobacco use, HTN, HLD, and chronic RBBB  Assessment & Plan    1. Coronary disease. critical proximal to mid LAD stenosis. S/p single vessel CABG. Progressing well.   2. Severe  aortic stenosis. Progressive by Echo from 2011>2014>2017 and now. S/p tissue AVR. Valve sounds good.   3. CHB post AVR. Not surprising given pre op marked bradycardia and RBBB. EP on board. Expect he will need permanent pacer next week  4. PAD with chronic claudication. severe aortoiliac disease by doppler. Will need PV evaluation after recovery from heart surgery.  5. Tobacco abuse. Have strongly encouraged smoking cessation.  6. Hyperlipidemia. High risk patient. LDL goal < 70. Recommend we  lipitor  80 mg daily.   7. Right bundle branch block with -chronic  8. Bipolar disorder.  9. Remote history of Etoh abuse- sober for years.      For questions or updates,  please contact [facility] Please consult [email or web address] for contact info under        Signed, Peter [name], MD  11/25/2019, 9:44 AM

## 2019-11-25 NOTE — Consult Note (Addendum)
ELECTROPHYSIOLOGY CONSULT NOTE    Patient ID: ATIKSH GANDEE MRN: [id number], DOB/AGE: Feb 10, 1951 69 y.o.  Admit date: 11/24/2019 Date of Consult: 11/25/2019  Primary Physician: [name], MD Primary Cardiologist: Dr. [name] Electrophysiologist: New   Referring Provider: Dr. Cyndia Bent  Patient Profile: Gregory [name] is a 68 y.o. male with a history of CAD s/p CABG 11/24/2019, severe aortic stenosis s/p AVR 11/24/2019, HTN, PAD, Tobacco abuse, HLD, and chronic RBBB who is being seen today for the evaluation of CHB s/p CABG/AVR at the request of Dr. Cyndia Bent.  HPI:  Gregory [name] is a 68 y.o. male medical history above. He presented 11/24/2019 for planned CABG/AVR. Per Dr. Cyndia Bent significant calcium around AV annulus and pt CHB developed intra-operatively.  Patient pacer dependent post op as of this am.  Extubed. Sitting up in bed. CT remain in place.  Pre op EKG shows 11/04/2019 shows sinus bradycardia at 45 bpm and complete RBBB with QRS 148 ms.  He is doing well, consider, post op. Mild chest soreness. Denies SOB. He did take a dose of lopressor 12.5 mg pre surgery yesterday.    Past Medical History:  Diagnosis Date   Alcohol abuse    Allergy    Aortic stenosis    Arthritis    Asthma    Bipolar disorder ([place])    CAD (coronary artery disease)    coronary calcifications 08/2013 CTA   Carotid artery occlusion    Chronic back pain    Claudication (HCC)    Depression    Family history of skin cancer    Heart murmur    Hepatitis    h/o 1970,doesn't remember which type,1990 epstain-barr   Hyperlipidemia    Hypertension    Neuromuscular disorder ([place])    Obsessive compulsive disorder    PAD (peripheral artery disease) ([place])    Peripheral neuropathy    Pneumonia    RBBB    RBBB (right bundle branch block with left anterior fascicular block)    Severe aortic stenosis    Sleep apnea    does not wear CPAP   Smoker    Spinal  stenosis at L4-L5 level    Tobacco abuse    Tobacco use disorder      Surgical History:  Past Surgical History:  Procedure Laterality Date   ANAL FISTULECTOMY  09/25/11   COLONOSCOPY  2009   ELBOW SURGERY     bilaterally for cubital tunnel   HERNIA REPAIR     with mesh   MAXIMUM ACCESS (MAS)POSTERIOR LUMBAR INTERBODY FUSION (PLIF) 1 LEVEL N/A 10/25/2014   Procedure: LUMBAR FOUR TO FIVE MAXIMUM ACCESS (MAS) POSTERIOR LUMBAR INTERBODY FUSION (PLIF) 1 LEVEL;  Surgeon: Eustace Moore, MD;  Location: [facility];  Service: Neurosurgery;  Laterality: N/A;   RIGHT/LEFT HEART CATH AND CORONARY ANGIOGRAPHY N/A 11/10/2019   Procedure: RIGHT/LEFT HEART CATH AND CORONARY ANGIOGRAPHY;  Surgeon: [name], [name], MD;  Location: [facility];  Service: Cardiovascular;  Laterality: N/A;   SKIN BIOPSY Left 10/12/2018   shave forehead Hypertrophic actinic kertosis with features of a verruca   TONSILLECTOMY  age 56   VASECTOMY     X 2   VASECTOMY REVERSAL       Facility-Administered Medications Prior to Admission  Medication Dose Route Frequency Provider Last Rate Last Dose   sodium chloride flush (NS) 0.9 % injection 3 mL  3 mL Intravenous Q12H [name], [name], MD       Medications  Prior to Admission  Medication Sig Dispense Refill Last Dose   amLODipine (NORVASC) 10 MG tablet TAKE ONE TABLET BY MOUTH DAILY 90 tablet 1 11/23/2019 at Unknown time   amoxicillin (AMOXIL) 500 MG capsule Take four capsules one hour before dental appointment. 4 capsule 1 11/23/2019 at Unknown time   aspirin EC 81 MG tablet Take 1 tablet (81 mg total) by mouth daily. 30 tablet 0 Past Week at Unknown time   atorvastatin (LIPITOR) 80 MG tablet Take 1 tablet (80 mg total) by mouth daily. 90 tablet 3 11/23/2019 at Unknown time   divalproex (DEPAKOTE) 500 MG DR tablet Take 500 mg by mouth at bedtime. 2-3 tabs   Past Week at Unknown time   finasteride (PROSCAR) 5 MG tablet TAKE ONE TABLET BY MOUTH DAILY 90  tablet 2 11/23/2019 at Unknown time   fluticasone (FLONASE) 50 MCG/ACT nasal spray SPRAY TWO SPRAYS IN THE AFFECTED NOSTRIL DAILY 16 g 11 11/23/2019 at Unknown time   Fluticasone-Salmeterol (ADVAIR DISKUS) 500-50 MCG/DOSE AEPB Inhale 1 puff into the lungs every 12 (twelve) hours. 180 each 3 Past Week at Unknown time   ibuprofen (ADVIL) 200 MG tablet Take 400-800 mg by mouth every 8 (eight) hours as needed for moderate pain.    Past Week at Unknown time   lamoTRIgine (LAMICTAL) 200 MG tablet Take 1 tablet (200 mg total) by mouth every morning. 90 tablet 3 11/24/2019 at 0450   lisinopril-hydrochlorothiazide (ZESTORETIC) 20-12.5 MG tablet TAKE ONE TABLET BY MOUTH EVERY MORNING 90 tablet 2 11/23/2019 at Unknown time   metoprolol tartrate (LOPRESSOR) 25 MG tablet TAKE ONE-HALF TABLET BY MOUTH DAILY (Patient taking differently: Take 12.5 mg by mouth daily. ) 45 tablet 2 11/24/2019 at 0450   Multiple Vitamins-Minerals (MULTIVITAMIN ADULTS 50+ PO) Take 1 tablet by mouth daily.    Past Week at Unknown time   albuterol (PROVENTIL HFA;VENTOLIN HFA) 108 (90 Base) MCG/ACT inhaler Inhale 2 puffs into the lungs every 6 (six) hours as needed for wheezing. 3 Inhaler 1    nortriptyline (PAMELOR) 10 MG capsule TAKE ONE CAPSULE BY MOUTH EVERY NIGHT AT BEDTIME 30 capsule 1 Unknown at Unknown time    Inpatient Medications:   acetaminophen  1,000 mg Oral Q6H   Or   acetaminophen (TYLENOL) oral liquid 160 mg/5 mL  1,000 mg Per Tube Q6H   amLODipine  5 mg Oral Daily   aspirin EC  325 mg Oral Daily   Or   aspirin  324 mg Per Tube Daily   atorvastatin  80 mg Oral Daily   bisacodyl  10 mg Oral Daily   Or   bisacodyl  10 mg Rectal Daily   Chlorhexidine Gluconate Cloth  6 each Topical Daily   divalproex  500 mg Oral QHS   docusate sodium  200 mg Oral Daily   finasteride  5 mg Oral Daily   fluticasone  2 spray Each Nare Daily   furosemide  20 mg Intravenous BID   insulin aspart  0-24 Units  Subcutaneous Q4H   lamoTRIgine  200 mg Oral q morning - 10a   mouth rinse  15 mL Mouth Rinse BID   mometasone-formoterol  2 puff Inhalation BID   [START ON 11/26/2019] pantoprazole  40 mg Oral Daily   sodium chloride flush  10-40 mL Intracatheter Q12H   sodium chloride flush  3 mL Intravenous Q12H    Allergies:  Allergies  Allergen Reactions   Codeine Anaphylaxis    Social History   Socioeconomic  History   Marital status: Married    Spouse name: Not on file   Number of children: 6   Years of education: Not on file   Highest education level: Not on file  Occupational History   Not on file  Social Needs   Financial resource strain: Not on file   Food insecurity    Worry: Not on file    Inability: Not on file   Transportation needs    Medical: Not on file    Non-medical: Not on file  Tobacco Use   Smoking status: Current Every Day Smoker    Packs/day: 1.00    Years: 25.00    Pack years: 25.00    Types: Cigarettes   Smokeless tobacco: Never Used   Tobacco comment: Pt had stopped smoking X10 years/ currently smoking a pack a day   Substance and Sexual Activity   Alcohol use: Yes    Alcohol/week: 78.0 standard drinks    Types: 50 Glasses of wine, 28 Standard drinks or equivalent per week    Comment: None for the past 5 years. 11/14/19   Drug use: No    Comment: former alcoholic   Sexual activity: Yes  Lifestyle   Physical activity    Days per week: Not on file    Minutes per session: Not on file   Stress: Not on file  Relationships   Social connections    Talks on phone: Not on file    Gets together: Not on file    Attends religious service: Not on file    Active member of club or organization: Not on file    Attends meetings of clubs or organizations: Not on file    Relationship status: Not on file   Intimate partner violence    Fear of current or ex partner: Not on file    Emotionally abused: Not on file    Physically abused: Not on  file    Forced sexual activity: Not on file  Other Topics Concern   Not on file  Social History Narrative   Lives in [place] with wife.  Does not routinely exercise.  Sedentary r/t chronic lbp.     Family History  Problem Relation Age of Onset   Stroke Father 54   Cancer Father        skin   Drug abuse Father    Hypertension Brother    Diabetes Brother    Drug abuse Brother    Heart disease Brother        s/p CABG, pacemaker   Heart failure Mother    Cancer Maternal Aunt        skin   Cancer Maternal Grandmother        liver     Review of Systems: All other systems reviewed and are otherwise negative except as noted above.  Physical Exam: Vitals:   11/25/19 0700 11/25/19 0715 11/25/19 0730 11/25/19 0745  BP: (!) 154/74     Pulse: 80 80 80 80  Resp: 20 17 15 15   Temp: 98.4 F (36.9 C) 98.4 F (36.9 C) 98.4 F (36.9 C) 98.2 F (36.8 C)  TempSrc:      SpO2: 100% 98% 98% 98%  Weight: 68.2 kg     Height: 5' 5.5" (1.664 m)       GEN- The patient is well appearing, alert and oriented x 3 today.   HEENT: normocephalic, atraumatic; sclera clear, conjunctiva pink; hearing intact; oropharynx clear; neck supple Lungs- Clear  to ausculation bilaterally, normal work of breathing.  No wheezes, rales, rhonchi Heart- Regular rate and rhythm, no murmurs, rubs or gallops GI- soft, non-tender, non-distended, bowel sounds present Extremities- no clubbing, cyanosis, or edema; DP/PT/radial pulses 2+ bilaterally MS- no significant deformity or atrophy Skin- warm and dry, no rash or lesion Psych- euthymic mood, full affect Neuro- strength and sensation are intact  Labs:   Lab Results  Component Value Date   WBC 8.4 11/25/2019   HGB 10.9 (L) 11/25/2019   HCT 32.3 (L) 11/25/2019   MCV 92.3 11/25/2019   PLT 91 (L) 11/25/2019    Recent Labs  Lab 11/22/19 1144  11/25/19 0343  NA 140   < > 136  K 4.4   < > 4.8  CL 104   < > 104  CO2 20*   < > 24  BUN 12   < > 13    CREATININE 0.87   < > 0.72  CALCIUM 9.4   < > 7.7*  PROT 7.1  --   --   BILITOT 0.6  --   --   ALKPHOS 48  --   --   ALT 38  --   --   AST 30  --   --   GLUCOSE 90   < > 125*   < > = values in this interval not displayed.      Radiology/Studies: Dg Chest 2 View  Result Date: 11/22/2019 CLINICAL DATA:  Preoperative evaluation for upcoming aortic valve repair EXAM: CHEST - 2 VIEW COMPARISON:  11/18/2019 FINDINGS: Cardiac shadow is mildly enlarged. Aortic calcifications are seen. The lungs are hyper aerated bilaterally. No focal infiltrate or sizable effusion is noted. Degenerative changes of the thoracic spine are noted. IMPRESSION: COPD without acute abnormality. Electronically Signed   By: Inez Catalina M.D.   On: 11/22/2019 16:59   Ct Coronary Morph W/cta Cor W/score W/ca W/cm &/or Wo/cm  Addendum Date: 11/18/2019   ADDENDUM REPORT: 11/18/2019 15:35 ADDENDUM: Extracardiac findings are described separately under dictation for contemporaneously obtained CTA chest, abdomen and pelvis dated 11/18/2019. Please see that dictation for full description. Electronically Signed   By: Vinnie Langton M.D.   On: 11/18/2019 15:35   Result Date: 11/18/2019 CLINICAL DATA:  Aortic Stenosis EXAM: Cardiac TAVR CT TECHNIQUE: The patient was scanned on a Siemens Force [id number] slice scanner. A 120 kV retrospective scan was triggered in the ascending thoracic aorta at 140 HU's. Gantry rotation speed was 250 msecs and collimation was .6 mm. No beta blockade or nitro were given. The 3D data set was reconstructed in 5% intervals of the R-R cycle. Systolic and diastolic phases were analyzed on a dedicated work station using MPR, MIP and VRT modes. The patient received 80 cc of contrast. FINDINGS: Aortic Valve: Tri leaflet severely calcified with restricted leaflet motion Aorta: No aneurysm Moderate calcific atherosclerosis normal arch vessels Sino-tubular Junction: 26 mm Ascending Thoracic Aorta: 31 mm Aortic Arch: 31 mm  Descending Thoracic Aorta: 23 mm Sinus of Valsalva Measurements: Non-coronary: 31.4 mm Right - coronary: 29.8 mm Left -   coronary: 33.6 mm Coronary Artery Height above Annulus: Left Main: 11.5 mm above annulus Right Coronary: 14.2 mm above annulus Virtual Basal Annulus Measurements: Maximum / Minimum Diameter: 22.3 mm x 27.2 mm Perimeter: 80 mm Area: 501 mm2 Coronary Arteries: Sufficient height above annulus for deployment Optimum Fluoroscopic Angle for Delivery: LAO 14 Caudal 14 degrees IMPRESSION: 1. Tri leaflet AV with annular area of 501 mm2 suitable  for a 26 mm Sapien 3 Ultra valve 2. Optimum angiographic angle for deployment LAO 14 Caudal 14 degrees 3.  Normal aortic root 3.1 cm 4.  Coronary arteries sufficient height above annulus for deployment Jenkins Rouge Electronically Signed: By: Jenkins Rouge M.D. On: 11/18/2019 13:38   Dg Chest Port 1 View  Result Date: 11/25/2019 CLINICAL DATA:  Dyspnea, status post aortic valve replacement EXAM: PORTABLE CHEST 1 VIEW COMPARISON:  Chest radiograph from one day prior. FINDINGS: Stable right internal jugular Swan-Ganz catheter with tip overlying the main pulmonary artery. Aortic valve prosthesis in place. Intact sternotomy wires. Stable mediastinal drains and left apical chest tube. Stable cardiomediastinal silhouette with mild cardiomegaly. No pneumothorax. No pleural effusion. No overt pulmonary edema. Mild bibasilar atelectasis is slightly worsened. IMPRESSION: 1. No pneumothorax.  Well-positioned support structures. 2. Stable mild cardiomegaly without overt pulmonary edema. 3. Mild bibasilar atelectasis, slightly worsened. Electronically Signed   By: Ilona Sorrel M.D.   On: 11/25/2019 08:20   Dg Chest Port 1 View  Result Date: 11/24/2019 CLINICAL DATA:  Status post CABG and aortic valve replacement. EXAM: PORTABLE CHEST 1 VIEW COMPARISON:  Chest x-ray dated 11/22/2019 FINDINGS: Endotracheal tube in good position 7 cm above the carina. NG tube tip in the  body of the stomach. Swan-Ganz catheter tip in the main pulmonary artery. Chest tubes in place. No pneumothorax. Heart size and vascularity are normal. Lungs are clear. IMPRESSION: Satisfactory postoperative appearance of the chest after CABG and aortic valve replacement. No pneumothorax. Aortic Atherosclerosis (ICD10-I70.0). Electronically Signed   By: Lorriane Shire M.D.   On: 11/24/2019 14:21   Ct Angio Chest Aorta W &/or Wo Contrast  Result Date: 11/18/2019 CLINICAL DATA:  68 year old male with history of severe aortic stenosis. Preoperative evaluation prior to potential transcatheter aortic valve replacement (TAVR) procedure. EXAM: CT ANGIOGRAPHY CHEST, ABDOMEN AND PELVIS TECHNIQUE: Multidetector CT imaging through the chest, abdomen and pelvis was performed using the standard protocol during bolus administration of intravenous contrast. Multiplanar reconstructed images and MIPs were obtained and reviewed to evaluate the vascular anatomy. CONTRAST:  173mL OMNIPAQUE IOHEXOL 350 MG/ML SOLN COMPARISON:  Chest CTA 08/29/2013. FINDINGS: CTA CHEST FINDINGS Cardiovascular: Heart size is mildly enlarged. There is no significant pericardial fluid, thickening or pericardial calcification. There is aortic atherosclerosis, as well as atherosclerosis of the great vessels of the mediastinum and the coronary arteries, including calcified atherosclerotic plaque in the left main, left anterior descending, left circumflex and right coronary arteries. Severe thickening calcification of the aortic valve. Mild calcifications of the inferior aspect of the mitral annulus. Mediastinum/Lymph Nodes: No pathologically enlarged mediastinal or hilar lymph nodes. Esophagus is unremarkable in appearance. No axillary lymphadenopathy. Lungs/Pleura: Mild diffuse bronchial wall thickening with mild centrilobular and paraseptal emphysema. No acute consolidative airspace disease. No pleural effusions. No suspicious appearing pulmonary nodules  or masses are noted. Musculoskeletal/Soft Tissues: There are no aggressive appearing lytic or blastic lesions noted in the visualized portions of the skeleton. Old healed right-sided rib fractures incidentally noted. CTA ABDOMEN AND PELVIS FINDINGS Hepatobiliary: Subcentimeter low-attenuation lesion in segment 4B of the liver, too small to characterize, but statistically likely to represent a cyst. No other suspicious hepatic lesions. No intra or extrahepatic biliary ductal dilatation. Gallbladder is normal in appearance. Pancreas: No pancreatic mass. No pancreatic ductal dilatation. No pancreatic or peripancreatic fluid collections or inflammatory changes. Spleen: Unremarkable. Adrenals/Urinary Tract: Subcentimeter low-attenuation lesion in the upper pole the right kidney, too small to characterize, but statistically likely to represent a cyst.  Left kidney and left adrenal gland are normal in appearance. 1.7 x 1.3 cm right adrenal nodule, similar to remote prior examinations, incompletely characterized on today's examination, but statistically likely to represent a benign lesion such as an adenoma. No hydroureteronephrosis. Urinary bladder is normal in appearance. Stomach/Bowel: Normal appearance of the stomach. No pathologic dilatation of small bowel or colon. Normal appendix. Vascular/Lymphatic: Aortic atherosclerosis with vascular findings and measurements pertinent to potential TAVR procedure, as detailed below. No aneurysm or dissection noted in the abdominal or pelvic vasculature. Complete occlusion of the right common iliac artery as well as the left external iliac and left common femoral arteries with distal reconstitution of flow via collateral pathways. No lymphadenopathy noted in the abdomen or pelvis. Reproductive: Prostate gland and seminal vesicles are unremarkable in appearance. Other: No significant volume of ascites.  No pneumoperitoneum. Musculoskeletal: Status post PLIF at L4-L5 with interbody  graft at this level. There are no aggressive appearing lytic or blastic lesions noted in the visualized portions of the skeleton. VASCULAR MEASUREMENTS PERTINENT TO TAVR: AORTA: Minimal Aortic Diameter-3 x 5 mm Severity of Aortic Calcification-severe RIGHT PELVIS: Right Common Iliac Artery - Minimal Diameter - Completely occluded. Tortuosity-mild Calcification-severe Right External Iliac Artery - Minimal Diameter-3.7 x 2.9 mm Tortuosity - mild Calcification-mild Right Common Femoral Artery - Minimal Diameter-5.8 x 1.4 mm Tortuosity - mild Calcification-severe LEFT PELVIS: Left Common Iliac Artery - Minimal Diameter-3.1 x 3.1 mm Tortuosity - mild Calcification-severe Left External Iliac Artery - Minimal Diameter - Completely occluded. Tortuosity - mild Calcification-mild Left Common Femoral Artery - Minimal Diameter - Completely occluded. Tortuosity - mild Calcification-severe Review of the MIP images confirms the above findings. IMPRESSION: 1. Vascular findings and measurements pertinent to potential TAVR procedure, as detailed above. 2. Severe thickening calcification of the aortic valve, compatible with the reported clinical history of severe aortic stenosis. 3. Aortic atherosclerosis, in addition to left main and 3 vessel coronary artery disease. 4. Mild cardiomegaly. 5. Additional incidental findings, as above. Electronically Signed   By: Vinnie Langton M.D.   On: 11/18/2019 15:30   Vas [place] Burnard Bunting With/wo Tbi  Result Date: 11/06/2019 LOWER EXTREMITY DOPPLER STUDY Indications: Claudication, rest pain, peripheral artery disease, and Patient has              had pain and cramping above his buttocks for about 5-6 years. He              complains of both feet being numb all the time. He only can walk              for about 10 minutes before having to sit and rest. He also              complains of pain in his lower back upon waking each morning for              the past 5-6 years. High Risk Factors: Hypertension,  hyperlipidemia, current smoker, coronary artery                    disease. Other Factors: Patient has severe aortic valve stenosis. Currently being worked                up for possible TAVR.                 SEE LOWER LEG ARTERIAL DUPLX REPORT (ALSO INCLUDES LIMITED                AORTOILIAC).  Comparison Study: Prior ABI 07/03/2010 showed right .76 left .72 with monophasic                   flow. No intervention done. Performing Technologist: Salvadore Dom RVT, RDCS (AE), RDMS  Examination Guidelines: A complete evaluation includes at minimum, Doppler waveform signals and systolic blood pressure reading at the level of bilateral brachial, anterior tibial, and posterior tibial arteries, when vessel segments are accessible. Bilateral testing is considered an integral part of a complete examination. Photoelectric Plethysmograph (PPG) waveforms and toe systolic pressure readings are included as required and additional duplex testing as needed. Limited examinations for reoccurring indications may be performed as noted.  ABI Findings: +---------+------------------+-----+----------+--------+  Right     Rt Pressure (mmHg) Index Waveform   Comment   +---------+------------------+-----+----------+--------+  Brachial  169                                           +---------+------------------+-----+----------+--------+  ATA       92                 0.54  monophasic           +---------+------------------+-----+----------+--------+  PTA       95                 0.56  monophasic           +---------+------------------+-----+----------+--------+  PERO      77                 0.46  monophasic           +---------+------------------+-----+----------+--------+  Great Toe 74                 0.44  Abnormal             +---------+------------------+-----+----------+--------+ +---------+------------------+-----+----------+-------+  Left      Lt Pressure (mmHg) Index Waveform   Comment   +---------+------------------+-----+----------+-------+  Brachial  163                                          +---------+------------------+-----+----------+-------+  ATA       67                 0.40  monophasic          +---------+------------------+-----+----------+-------+  PTA       86                 0.51  monophasic          +---------+------------------+-----+----------+-------+  PERO      33                 0.20  monophasic          +---------+------------------+-----+----------+-------+  Great Toe 36                 0.21  Abnormal            +---------+------------------+-----+----------+-------+ +-------+-----------+-----------+------------+------------+  ABI/TBI Today's ABI Today's TBI Previous ABI Previous TBI  +-------+-----------+-----------+------------+------------+  Right   .56         .44         .76                        +-------+-----------+-----------+------------+------------+  Left    .51         .21         .72                        +-------+-----------+-----------+------------+------------+ Bilateral ABIs appear decreased.  Summary: Right: Resting right ankle-brachial index indicates moderate right lower extremity arterial disease. The right toe-brachial index is abnormal. Left: Resting left ankle-brachial index indicates moderate left lower extremity arterial disease. The left toe-brachial index is abnormal.  *See table(s) above for measurements and observations.  Vascular consult recommended. Electronically signed by Carlyle Dolly MD on 11/06/2019 at 11:13:32 AM.    Final    Doppler Pre Cabg  Result Date: 11/22/2019 PREOPERATIVE VASCULAR EVALUATION  Indications:      Pre-CABG. Risk Factors:     Hypertension, hyperlipidemia, current smoker, coronary artery                   disease, PAD. Comparison Study: ABI 11/04/2019 right 0.56, left 0.51. Carotid duplex                   07/03/2010 1-39% ICA stenosis bilaterally. Performing Technologist: Maudry Mayhew MHA, RVT, RDCS,  RDMS  Examination Guidelines: A complete evaluation includes B-mode imaging, spectral Doppler, color Doppler, and power Doppler as needed of all accessible portions of each vessel. Bilateral testing is considered an integral part of a complete examination. Limited examinations for reoccurring indications may be performed as noted.  Right Carotid Findings: +----------+--------+--------+--------+----------------------+-----------------+             PSV cm/s EDV cm/s Stenosis Describe               Comments           +----------+--------+--------+--------+----------------------+-----------------+  CCA Prox   68       9                                        Frond-like plaque  +----------+--------+--------+--------+----------------------+-----------------+  CCA Distal 68       11                smooth and                                                                       heterogenous                              +----------+--------+--------+--------+----------------------+-----------------+  ICA Prox   48       11                smooth                                    +----------+--------+--------+--------+----------------------+-----------------+  ICA Distal 91       22                                                          +----------+--------+--------+--------+----------------------+-----------------+  ECA        59       6                 smooth and                                                                       heterogenous                              +----------+--------+--------+--------+----------------------+-----------------+ Portions of this table do not appear on this page. +----------+--------+-------+--------------+------------+             PSV cm/s EDV cms Describe       Arm Pressure  +----------+--------+-------+--------------+------------+  Subclavian 246              Antegrade flow 113           +----------+--------+-------+--------------+------------+  +---------+--------+--+--------+-+---------+  Vertebral PSV cm/s 40 EDV cm/s 6 Antegrade  +---------+--------+--+--------+-+---------+ Left Carotid Findings: +----------+-------+-------+--------+---------------------------------+--------+             PSV     EDV     Stenosis Describe                          Comments              cm/s    cm/s                                                         +----------+-------+-------+--------+---------------------------------+--------+  CCA Prox   91      13               smooth, heterogenous and calcific           +----------+-------+-------+--------+---------------------------------+--------+  CCA Distal 74      13               smooth, heterogenous and calcific           +----------+-------+-------+--------+---------------------------------+--------+  ICA Prox   79      19               smooth and heterogenous                     +----------+-------+-------+--------+---------------------------------+--------+  ICA Distal 76      17                                                           +----------+-------+-------+--------+---------------------------------+--------+  ECA        65      10               heterogenous, irregular and  calcific                                    +----------+-------+-------+--------+---------------------------------+--------+ +----------+--------+--------+----------------+------------+  Subclavian PSV cm/s EDV cm/s Describe         Arm Pressure  +----------+--------+--------+----------------+------------+             138               Multiphasic, WNL 140           +----------+--------+--------+----------------+------------+ +---------+--------+---+--------+--+---------+  Vertebral PSV cm/s 122 EDV cm/s 17 Antegrade  +---------+--------+---+--------+--+---------+  ABI Findings: +--------+------------------+-----+---------+--------+  Right    Rt Pressure (mmHg) Index Waveform  Comment    +--------+------------------+-----+---------+--------+  Brachial 113                      triphasic           +--------+------------------+-----+---------+--------+ +--------+------------------+-----+---------+-------+  Left     Lt Pressure (mmHg) Index Waveform  Comment  +--------+------------------+-----+---------+-------+  Brachial 140                      triphasic          +--------+------------------+-----+---------+-------+  Right Doppler Findings: +-----------+--------+-----+---------+-----------------------------------------+  Site        Pressure Index Doppler   Comments                                   +-----------+--------+-----+---------+-----------------------------------------+  Brachial    113            triphasic                                            +-----------+--------+-----+---------+-----------------------------------------+  Radial                     triphasic                                            +-----------+--------+-----+---------+-----------------------------------------+  Ulnar                      triphasic                                            +-----------+--------+-----+---------+-----------------------------------------+  Palmar Arch                          Signal is unaffected with radial                                                 compression, obliterates with ulnar                                              compression.                               +-----------+--------+-----+---------+-----------------------------------------+  Left Doppler Findings: +-----------+--------+-----+---------+---------------------+  Site        Pressure Index Doppler   Comments               +-----------+--------+-----+---------+---------------------+  Brachial    140            triphasic                        +-----------+--------+-----+---------+---------------------+  Radial                     triphasic                         +-----------+--------+-----+---------+---------------------+  Ulnar                      triphasic                        +-----------+--------+-----+---------+---------------------+  Palmar Arch                          Within normal limits.  +-----------+--------+-----+---------+---------------------+  Summary: Right Carotid: Velocities in the right ICA are consistent with a 1-39% stenosis. Left Carotid: Velocities in the left ICA are consistent with a 1-39% stenosis. Vertebrals:  Bilateral vertebral arteries demonstrate antegrade flow. Subclavians: Normal flow hemodynamics were seen in the left subclavian artery.              Right subclavian artery exhibits antegrade flow with elevated              velocities. This finding, along with the right brachial artery              pressure being 54mmHg lower than the left brachial artery pressure,              is suggestive of proximal obstruction.  Electronically signed by Deitra Mayo MD on 11/22/2019 at 12:20:00 PM.    Final    Vas [place] Lower Extremity Arterial Duplex  Result Date: 11/06/2019 LOWER EXTREMITY ARTERIAL DUPLEX STUDY Indications: Claudication, peripheral artery disease, and Patient has had pain              and cramping above his buttocks for about 5-6 years. He complains              of both feet being numb all the time. He only can walk for about 10              minutes before having to sit and rest. He also complains of pain in              his lower back upon waking each morning for the past 5-6 years. High Risk Factors: Hypertension, hyperlipidemia, current smoker, coronary artery                    disease. Other Factors: Patient has severe aortic valve stenosis. Currently being worked                up for possible TAVR.                 SEE ABI REPORT.  Current ABI: Right .56 Left .37 Comparison Study: None Performing Technologist: Alecia Mackin RVT, RDCS (AE), RDMS  Examination Guidelines: A complete evaluation includes B-mode imaging,  spectral Doppler, color Doppler, and power Doppler as needed of all  accessible portions of each vessel. Bilateral testing is considered an integral part of a complete examination. Limited examinations for reoccurring indications may be performed as noted.  +-----------+--------+-----+--------+-------------------+----------------------+  RIGHT       PSV cm/s Ratio Stenosis Waveform            Comments                +-----------+--------+-----+--------+-------------------+----------------------+  CIA Prox    202                     monophasic                                  +-----------+--------+-----+--------+-------------------+----------------------+  CIA Mid     220                     monophasic                                  +-----------+--------+-----+--------+-------------------+----------------------+  EIA Mid     46                      monophasic                                  +-----------+--------+-----+--------+-------------------+----------------------+  EIA Distal  43                      monophasic                                  +-----------+--------+-----+--------+-------------------+----------------------+  CFA Prox    64                      monophasic                                  +-----------+--------+-----+--------+-------------------+----------------------+  CFA Distal  58                      monophasic                                  +-----------+--------+-----+--------+-------------------+----------------------+  DFA         80                      monophasic                                  +-----------+--------+-----+--------+-------------------+----------------------+  SFA Prox    113                     monophasic                                  +-----------+--------+-----+--------+-------------------+----------------------+  SFA Mid     52  monophasic          area of calcified                                                                shadowing  mid/dst SFA   +-----------+--------+-----+--------+-------------------+----------------------+  SFA Distal  35                      monophasic                                  +-----------+--------+-----+--------+-------------------+----------------------+  POP Prox    58                      monophasic                                  +-----------+--------+-----+--------+-------------------+----------------------+  POP Distal  29                      monophasic                                  +-----------+--------+-----+--------+-------------------+----------------------+  TP Trunk    27                      monophasic                                  +-----------+--------+-----+--------+-------------------+----------------------+  ATA Prox    23                      monophasic                                  +-----------+--------+-----+--------+-------------------+----------------------+  ATA Mid     21                      monophasic                                  +-----------+--------+-----+--------+-------------------+----------------------+  ATA Distal  18                      monophasic                                  +-----------+--------+-----+--------+-------------------+----------------------+  PTA Prox    24                      dampened monophasic                         +-----------+--------+-----+--------+-------------------+----------------------+  PTA Mid     24  monophasic                                  +-----------+--------+-----+--------+-------------------+----------------------+  PTA Distal  20                      monophasic                                  +-----------+--------+-----+--------+-------------------+----------------------+  PERO Prox   28                      monophasic                                  +-----------+--------+-----+--------+-------------------+----------------------+  PERO Mid    11                      monophasic                                   +-----------+--------+-----+--------+-------------------+----------------------+  PERO Distal 10                      monophasic                                  +-----------+--------+-----+--------+-------------------+----------------------+  +-----------+--------+-----+--------------+------------------+-----------------+  LEFT        PSV cm/s Ratio Stenosis       Waveform           Comments           +-----------+--------+-----+--------------+------------------+-----------------+  CIA Prox    688            75-99%         monophasic                                                        stenosis                                             +-----------+--------+-----+--------------+------------------+-----------------+  CIA Mid     155                           monophasic                            +-----------+--------+-----+--------------+------------------+-----------------+  CIA Distal  150                           monophasic                            +-----------+--------+-----+--------------+------------------+-----------------+  EIA Mid  occluded                                             +-----------+--------+-----+--------------+------------------+-----------------+  EIA Distal                 occluded                                             +-----------+--------+-----+--------------+------------------+-----------------+  CFA Prox    50                            monophasic                            +-----------+--------+-----+--------------+------------------+-----------------+  CFA Distal  32                            monophasic                            +-----------+--------+-----+--------------+------------------+-----------------+  DFA         52                            monophasic                            +-----------+--------+-----+--------------+------------------+-----------------+  SFA Prox    50                            monophasic                             +-----------+--------+-----+--------------+------------------+-----------------+  SFA Mid     43                            monophasic                            +-----------+--------+-----+--------------+------------------+-----------------+  SFA Distal  37                            monophasic         area of calcified                                                                shadowing in dst  sfa/prox pop       +-----------+--------+-----+--------------+------------------+-----------------+  POP Prox    36                            monophasic                            +-----------+--------+-----+--------------+------------------+-----------------+  POP Distal  31                            monophasic                            +-----------+--------+-----+--------------+------------------+-----------------+  TP Trunk    29                            monophasic                            +-----------+--------+-----+--------------+------------------+-----------------+  ATA Prox    10                            dampened                                                                         monophasic                            +-----------+--------+-----+--------------+------------------+-----------------+  ATA Mid     10                            monophasic                            +-----------+--------+-----+--------------+------------------+-----------------+  ATA Distal  10                            monophasic                            +-----------+--------+-----+--------------+------------------+-----------------+  PTA Prox    19                            dampened                                                                         monophasic                            +-----------+--------+-----+--------------+------------------+-----------------+  PTA Mid  25                            monophasic                             +-----------+--------+-----+--------------+------------------+-----------------+  PTA Distal  27                            monophasic                            +-----------+--------+-----+--------------+------------------+-----------------+  PERO Prox   10                            dampened                                                                         monophasic                            +-----------+--------+-----+--------------+------------------+-----------------+  PERO Mid    12                            monophasic                            +-----------+--------+-----+--------------+------------------+-----------------+  PERO Distal 8                             monophasic                            +-----------+--------+-----+--------------+------------------+-----------------+  Aorta: +--------+-------+----------+----------+--------+--------+-----+           AP (cm) Trans (cm) PSV (cm/s) Waveform Thrombus Shape  +--------+-------+----------+----------+--------+--------+-----+  Proximal         1.80                                           +--------+-------+----------+----------+--------+--------+-----+  Distal                      209                                 +--------+-------+----------+----------+--------+--------+-----+ Unable to see aorta and portions of both iliac arteries due to overlying bowel gas, patient discomfort, non NPO (late afternoon appointment). It appears there is a stenosis in the distal aorta which was only seen in coronal view.  Summary: Right: 50-74% stenosis noted in the aortoiliac segment. 50-74% stenosis noted in the iliac segment. The distal aorta/right CIA ostium was not identified and could have an underlying stenosis. The vessels that were visualized have heavy calcifications within from distal aorta down to calf arteries. Three  vessel runoff. Left: 75-99% stenosis noted in the aortoiliac segment. Total occlusion noted in the iliac segment. The distal aorta  and left CIA ostium appear to have a stenosis. The vessels that were visualized have heavy calcifications within from distal aorta down to  calf arteries. Three vessel runoff.  See table(s) above for measurements and observations.  Patient is scheduled to see Dr. Gwenlyn Found on 11/16/2019 at 10:00am.  Vascular consult recommended. Electronically signed by Carlyle Dolly MD on 11/06/2019 at 11:22:34 AM.    Final    Ct Angio Abd/pel W/ And/or W/o  Result Date: 11/18/2019 CLINICAL DATA:  68 year old male with history of severe aortic stenosis. Preoperative evaluation prior to potential transcatheter aortic valve replacement (TAVR) procedure. EXAM: CT ANGIOGRAPHY CHEST, ABDOMEN AND PELVIS TECHNIQUE: Multidetector CT imaging through the chest, abdomen and pelvis was performed using the standard protocol during bolus administration of intravenous contrast. Multiplanar reconstructed images and MIPs were obtained and reviewed to evaluate the vascular anatomy. CONTRAST:  122mL OMNIPAQUE IOHEXOL 350 MG/ML SOLN COMPARISON:  Chest CTA 08/29/2013. FINDINGS: CTA CHEST FINDINGS Cardiovascular: Heart size is mildly enlarged. There is no significant pericardial fluid, thickening or pericardial calcification. There is aortic atherosclerosis, as well as atherosclerosis of the great vessels of the mediastinum and the coronary arteries, including calcified atherosclerotic plaque in the left main, left anterior descending, left circumflex and right coronary arteries. Severe thickening calcification of the aortic valve. Mild calcifications of the inferior aspect of the mitral annulus. Mediastinum/Lymph Nodes: No pathologically enlarged mediastinal or hilar lymph nodes. Esophagus is unremarkable in appearance. No axillary lymphadenopathy. Lungs/Pleura: Mild diffuse bronchial wall thickening with mild centrilobular and paraseptal emphysema. No acute consolidative airspace disease. No pleural effusions. No suspicious appearing pulmonary  nodules or masses are noted. Musculoskeletal/Soft Tissues: There are no aggressive appearing lytic or blastic lesions noted in the visualized portions of the skeleton. Old healed right-sided rib fractures incidentally noted. CTA ABDOMEN AND PELVIS FINDINGS Hepatobiliary: Subcentimeter low-attenuation lesion in segment 4B of the liver, too small to characterize, but statistically likely to represent a cyst. No other suspicious hepatic lesions. No intra or extrahepatic biliary ductal dilatation. Gallbladder is normal in appearance. Pancreas: No pancreatic mass. No pancreatic ductal dilatation. No pancreatic or peripancreatic fluid collections or inflammatory changes. Spleen: Unremarkable. Adrenals/Urinary Tract: Subcentimeter low-attenuation lesion in the upper pole the right kidney, too small to characterize, but statistically likely to represent a cyst. Left kidney and left adrenal gland are normal in appearance. 1.7 x 1.3 cm right adrenal nodule, similar to remote prior examinations, incompletely characterized on today's examination, but statistically likely to represent a benign lesion such as an adenoma. No hydroureteronephrosis. Urinary bladder is normal in appearance. Stomach/Bowel: Normal appearance of the stomach. No pathologic dilatation of small bowel or colon. Normal appendix. Vascular/Lymphatic: Aortic atherosclerosis with vascular findings and measurements pertinent to potential TAVR procedure, as detailed below. No aneurysm or dissection noted in the abdominal or pelvic vasculature. Complete occlusion of the right common iliac artery as well as the left external iliac and left common femoral arteries with distal reconstitution of flow via collateral pathways. No lymphadenopathy noted in the abdomen or pelvis. Reproductive: Prostate gland and seminal vesicles are unremarkable in appearance. Other: No significant volume of ascites.  No pneumoperitoneum. Musculoskeletal: Status post PLIF at L4-L5 with  interbody graft at this level. There are no aggressive appearing lytic or blastic lesions noted in the visualized portions of the skeleton. VASCULAR MEASUREMENTS PERTINENT TO TAVR: AORTA: Minimal Aortic Diameter-3 x 5 mm Severity  of Aortic Calcification-severe RIGHT PELVIS: Right Common Iliac Artery - Minimal Diameter - Completely occluded. Tortuosity-mild Calcification-severe Right External Iliac Artery - Minimal Diameter-3.7 x 2.9 mm Tortuosity - mild Calcification-mild Right Common Femoral Artery - Minimal Diameter-5.8 x 1.4 mm Tortuosity - mild Calcification-severe LEFT PELVIS: Left Common Iliac Artery - Minimal Diameter-3.1 x 3.1 mm Tortuosity - mild Calcification-severe Left External Iliac Artery - Minimal Diameter - Completely occluded. Tortuosity - mild Calcification-mild Left Common Femoral Artery - Minimal Diameter - Completely occluded. Tortuosity - mild Calcification-severe Review of the MIP images confirms the above findings. IMPRESSION: 1. Vascular findings and measurements pertinent to potential TAVR procedure, as detailed above. 2. Severe thickening calcification of the aortic valve, compatible with the reported clinical history of severe aortic stenosis. 3. Aortic atherosclerosis, in addition to left main and 3 vessel coronary artery disease. 4. Mild cardiomegaly. 5. Additional incidental findings, as above. Electronically Signed   By: Vinnie Langton M.D.   On: 11/18/2019 15:30    EKG: Pre op EKG shows 11/04/2019 shows sinus bradycardia at 45 bpm and complete RBBB with QRS 148 ms. (personally reviewed)  TELEMETRY: V paced at 80, CHB underlying with no escape when paused briefly this am. (personally reviewed)  Assessment/Plan: 1.  CHB s/p CABG/AVR Dependent POD#1.  Will follow through the weekend, and if conduction doesn't recover, will likely plan PPM early next week.  Avoid AV nodal blocking agents. He had a 12.5 mg lopressor dose yesterday am.  2. CAD s/p CABG TCTS following.  Extubated. CTs remains in place  3. Severe aortic stenosis s/p AVR Stable post op  4. RBBB and sinus bradycardia at baseline On pre-op EKG. Previously on lopressor 12.5 mg daily, last dose 12/3 am.  For questions or updates, please contact [place] Please consult [email or web address] for contact info under Cardiology/STEMI.  Signed, Shirley Friar, PA-C  11/25/2019 9:05 AMe  Problem list and eval as above  Post operative CHB following AVR/CABG  Will observe over the weekend, hopefully there will be recovery of conduction but not sanguine  I am on this weekend and will follow along with TCTS\  New guidelines recommend 3-5 days of observation for AV replacement   Thanks

## 2019-11-25 NOTE — Progress Notes (Addendum)
TCTS DAILY ICU PROGRESS NOTE                   [address]            [place] [address]          [phone]   1 Day Post-Op Procedure(s) (LRB): AORTIC VALVE REPLACEMENT (AVR) using INSPIRIS Resilia 23 MM Bioprosthetic Aortic Valve. (N/A) CORONARY ARTERY BYPASS GRAFTING (CABG) using LIMA to LAD. (N/A) TRANSESOPHAGEAL ECHOCARDIOGRAM (TEE) (N/A)  Total Length of Stay:  LOS: 1 day   Subjective: Patient sitting up in bed. He asked if he would need a pacemaker.  Objective: Vital signs in last 24 hours: Temp:  [96.6 F (35.9 C)-99.5 F (37.5 C)] 98.4 F (36.9 C) (12/04 0700) Pulse Rate:  [80-82] 80 (12/04 0700) Cardiac Rhythm: A-V Sequential paced (12/04 0400) Resp:  [11-23] 20 (12/04 0700) BP: (89-154)/(58-80) 154/74 (12/04 0700) SpO2:  [97 %-100 %] 100 % (12/04 0700) Arterial Line BP: (87-162)/(45-68) 162/67 (12/04 0700) FiO2 (%):  [36 %-50 %] 36 % (12/03 1744) Weight:  [68.2 kg] 68.2 kg (12/04 0700)  Filed Weights   11/24/19 0600 11/25/19 0700  Weight: 66.5 kg 68.2 kg    Weight change: 1.703 kg   Hemodynamic parameters for last 24 hours: PAP: (17-48)/(7-23) 24/19 CO:  [3.8 L/min-5.2 L/min] 4.5 L/min CI:  [2 L/min/m2-3 L/min/m2] 2 L/min/m2  Intake/Output from previous day: 12/03 0701 - 12/04 0700 In: 4762 [P.O.:120; I.V.:2826.8; Blood:420; IV Piggyback:1395.2] Out: [id number] [Urine:3270; Chest Tube:560]  Intake/Output this shift: No intake/output data recorded.  Current Meds: Scheduled Meds: . acetaminophen  1,000 mg Oral Q6H   Or  . acetaminophen (TYLENOL) oral liquid 160 mg/5 mL  1,000 mg Per Tube Q6H  . aspirin EC  325 mg Oral Daily   Or  . aspirin  324 mg Per Tube Daily  . atorvastatin  80 mg Oral Daily  . bisacodyl  10 mg Oral Daily   Or  . bisacodyl  10 mg Rectal Daily  . Chlorhexidine Gluconate Cloth  6 each Topical Daily  . divalproex  500 mg Oral QHS  . docusate sodium  200 mg Oral Daily  . finasteride  5 mg Oral Daily  .  fluticasone  2 spray Each Nare Daily  . insulin aspart  0-24 Units Subcutaneous Q4H  . lamoTRIgine  200 mg Oral q morning - 10a  . mouth rinse  15 mL Mouth Rinse BID  . metoprolol tartrate  12.5 mg Oral BID   Or  . metoprolol tartrate  12.5 mg Per Tube BID  . mometasone-formoterol  2 puff Inhalation BID  . nortriptyline  10 mg Oral QHS  . [START ON 11/26/2019] pantoprazole  40 mg Oral Daily  . sodium chloride flush  10-40 mL Intracatheter Q12H  . sodium chloride flush  3 mL Intravenous Q12H   Continuous Infusions: . sodium chloride Stopped (11/25/19 0659)  . sodium chloride    . sodium chloride    . albumin human Stopped (11/25/19 0425)  . cefUROXime (ZINACEF)  IV Stopped (11/25/19 0422)  . dexmedetomidine (PRECEDEX) IV infusion Stopped (11/25/19 0510)  . lactated ringers    . lactated ringers 20 mL/hr at 11/25/19 0700  . nitroGLYCERIN 5 mcg/min (11/25/19 0700)  . phenylephrine (NEO-SYNEPHRINE) Adult infusion Stopped (11/25/19 0533)   PRN Meds:.sodium chloride, albumin human, levalbuterol, metoprolol tartrate, midazolam, morphine injection, ondansetron (ZOFRAN) IV, sodium chloride flush, sodium chloride flush  General appearance: alert, cooperative and no distress Neurologic:  intact Heart: AV paced this am, no murmur Lungs: Slightly diminished at bases Abdomen: Soft, non tender, sporadic bowel sounds present Extremities: Trace LE edema Wound: Dressing dry intact  Lab Results: CBC: Recent Labs    11/24/19 1934 11/25/19 0343  WBC 8.2 8.4  HGB 11.4* 10.9*  HCT 33.3* 32.3*  PLT 112* 91*   BMET:  Recent Labs    11/24/19 1934 11/25/19 0343  NA 138 136  K 4.9 4.8  CL 109 104  CO2 23 24  GLUCOSE 120* 125*  BUN 10 13  CREATININE 0.80 0.72  CALCIUM 7.7* 7.7*    CMET: Lab Results  Component Value Date   WBC 8.4 11/25/2019   HGB 10.9 (L) 11/25/2019   HCT 32.3 (L) 11/25/2019   PLT 91 (L) 11/25/2019   GLUCOSE 125 (H) 11/25/2019   CHOL 138 09/29/2019   TRIG 144  09/29/2019   HDL 36 (L) 09/29/2019   LDLCALC 77 09/29/2019   ALT 38 11/22/2019   AST 30 11/22/2019   NA 136 11/25/2019   K 4.8 11/25/2019   CL 104 11/25/2019   CREATININE 0.72 11/25/2019   BUN 13 11/25/2019   CO2 24 11/25/2019   TSH 0.702 08/29/2013   INR 1.5 (H) 11/24/2019   HGBA1C 5.9 (H) 11/22/2019      PT/INR:  Recent Labs    11/24/19 1347  LABPROT 18.0*  INR 1.5*   Radiology: Dg Chest Port 1 View  Result Date: 11/24/2019 CLINICAL DATA:  Status post CABG and aortic valve replacement. EXAM: PORTABLE CHEST 1 VIEW COMPARISON:  Chest x-ray dated 11/22/2019 FINDINGS: Endotracheal tube in good position 7 cm above the carina. NG tube tip in the body of the stomach. Swan-Ganz catheter tip in the main pulmonary artery. Chest tubes in place. No pneumothorax. Heart size and vascularity are normal. Lungs are clear. IMPRESSION: Satisfactory postoperative appearance of the chest after CABG and aortic valve replacement. No pneumothorax. Aortic Atherosclerosis (ICD10-I70.0). Electronically Signed   By: Lorriane Shire M.D.   On: 11/24/2019 14:21     Assessment/Plan: S/P Procedure(s) (LRB): AORTIC VALVE REPLACEMENT (AVR) using INSPIRIS Resilia 23 MM Bioprosthetic Aortic Valve. (N/A) CORONARY ARTERY BYPASS GRAFTING (CABG) using LIMA to LAD. (N/A) TRANSESOPHAGEAL ECHOCARDIOGRAM (TEE) (N/A)   1. CV-AV paced. CO/CI 4.5/2. Nurse turned pacer down this am and asystole briefly. Hopefully, conduction system will recover on its own;he had a lot of calcification so it would not be surprising if it does not recover and then he would need a PPM. He is hypertensive this am with SBP in the 150;s. On Nitro drip. Will discuss with Dr. Cyndia Bent as he needs diuresis so hesitant to start ACE this early post op. Will restart Amlodipine. 2. Pulmonary-On 2-3 liters of oxygen via [place]. Will wean as able over the next few days. Chest tubes with 560 cc since surgery. Will leave chest tubes for now. CXR this am appears to  show small b/l pleural effusions, no pneumothorax. Encourage incentive spirometer. 3. Volume overload-will give Lasix 20 mg IV twice today 4. ABL anemia- H and H 10.9 and 32. 5. CBGs 129/111/115. Pre op HGA1C 5.9. He likely has pre diabetes. Will transition off Insulin drip. Will stop accu checks and SS once tolerating po 6. Mild thrombocytopenia-platelets this am 91,000. On ecasa not Lovenox 7. Please see progression orders  Donielle Liston Alba PA-C 11/25/2019 7:12 AM    Chart reviewed, patient examined, agree with above. He is doing well overall. I turned down pacer and he  is in sinus 68 with CHB. Ventricular threshold is 1.3. I left it at 10. He will likely need a pacer given the amount of calcium in his annulus in the vicinity of the AV node with preop RBBB. Will ask EP to see him. Will remove MT's today and keep left pleural tube in.

## 2019-11-25 NOTE — Anesthesia Preprocedure Evaluation (Signed)
Anesthesia Evaluation  Patient identified by MRN, date of birth, ID band Patient awake    Reviewed: Allergy & Precautions, H&P , NPO status , Patient's Chart, lab work & pertinent test results  Airway Mallampati: II   Neck ROM: full    Dental   Pulmonary asthma , sleep apnea , Current Smoker and Patient abstained from smoking.,    breath sounds clear to auscultation       Cardiovascular hypertension, + CAD and + Peripheral Vascular Disease  + Valvular Problems/Murmurs AS  Rhythm:regular Rate:Normal  Cath: severe LAD 99% occluded. Preserved LV function   Neuro/Psych PSYCHIATRIC DISORDERS Anxiety Depression Bipolar Disorder  Neuromuscular disease    GI/Hepatic (+)     substance abuse  alcohol use, Hepatitis -  Endo/Other    Renal/GU      Musculoskeletal  (+) Arthritis ,   Abdominal   Peds  Hematology   Anesthesia Other Findings   Reproductive/Obstetrics                             Anesthesia Physical Anesthesia Plan  ASA: III  Anesthesia Plan: General   Post-op Pain Management:    Induction: Intravenous  PONV Risk Score and Plan: 1 and Ondansetron, Dexamethasone, Midazolam and Treatment may vary due to age or medical condition  Airway Management Planned: Oral ETT  Additional Equipment: Arterial line, CVP, PA Cath, TEE, 3D TEE and Ultrasound Guidance Line Placement  Intra-op Plan:   Post-operative Plan: Post-operative intubation/ventilation  Informed Consent: I have reviewed the patients History and Physical, chart, labs and discussed the procedure including the risks, benefits and alternatives for the proposed anesthesia with the patient or authorized representative who has indicated his/her understanding and acceptance.       Plan Discussed with: CRNA, Anesthesiologist and Surgeon  Anesthesia Plan Comments:         Anesthesia Quick Evaluation

## 2019-11-26 ENCOUNTER — Inpatient Hospital Stay (HOSPITAL_COMMUNITY): Payer: PPO

## 2019-11-26 DIAGNOSIS — I442 Atrioventricular block, complete: Secondary | ICD-10-CM | POA: Diagnosis not present

## 2019-11-26 DIAGNOSIS — Z953 Presence of xenogenic heart valve: Secondary | ICD-10-CM | POA: Diagnosis not present

## 2019-11-26 LAB — BASIC METABOLIC PANEL
Anion gap: 7 (ref 5–15)
BUN: 15 mg/dL (ref 8–23)
CO2: 28 mmol/L (ref 22–32)
Calcium: 7.9 mg/dL — ABNORMAL LOW (ref 8.9–10.3)
Chloride: 100 mmol/L (ref 98–111)
Creatinine, Ser: 0.65 mg/dL (ref 0.61–1.24)
GFR calc Af Amer: >60 mL/min (ref >60)
GFR calc non Af Amer: >60 mL/min (ref >60)
Glucose, Bld: 127 mg/dL — ABNORMAL HIGH (ref 70–99)
Potassium: 3.8 mmol/L (ref 3.5–5.1)
Sodium: 135 mmol/L (ref 135–145)

## 2019-11-26 LAB — GLUCOSE, CAPILLARY
Glucose-Capillary: 127 mg/dL — ABNORMAL HIGH (ref 70–99)
Glucose-Capillary: 144 mg/dL — ABNORMAL HIGH (ref 70–99)
Glucose-Capillary: 112 mg/dL — ABNORMAL HIGH (ref 70–99)
Glucose-Capillary: 106 mg/dL — ABNORMAL HIGH (ref 70–99)
Glucose-Capillary: 102 mg/dL — ABNORMAL HIGH (ref 70–99)

## 2019-11-26 LAB — CBC
HCT: 29.6 % — ABNORMAL LOW (ref 39.0–52.0)
Hemoglobin: 10.1 g/dL — ABNORMAL LOW (ref 13.0–17.0)
MCH: 31.3 pg (ref 26.0–34.0)
MCHC: 34.1 g/dL (ref 30.0–36.0)
MCV: 91.6 fL (ref 80.0–100.0)
Platelets: 81 10*3/uL — ABNORMAL LOW (ref 150–400)
RBC: 3.23 MIL/uL — ABNORMAL LOW (ref 4.22–5.81)
RDW: 13.9 % (ref 11.5–15.5)
WBC: 10.1 10*3/uL (ref 4.0–10.5)
nRBC: 0 % (ref 0.0–0.2)

## 2019-11-26 MED ORDER — INSULIN ASPART 100 UNIT/ML ~~LOC~~ SOLN
0.0000 [IU] | Freq: Three times a day (TID) | SUBCUTANEOUS | Status: DC
  Administered 2019-11-27 (×2): 2 [IU] via SUBCUTANEOUS

## 2019-11-26 MED ORDER — LISINOPRIL 10 MG PO TABS
10.0000 mg | ORAL_TABLET | Freq: Every day | ORAL | Status: DC
  Administered 2019-11-26: 10 mg via ORAL
  Filled 2019-11-26: qty 1

## 2019-11-26 NOTE — Progress Notes (Signed)
2 Days Post-Op Procedure(s) (LRB): AORTIC VALVE REPLACEMENT (AVR) using INSPIRIS Resilia 23 MM Bioprosthetic Aortic Valve. (N/A) CORONARY ARTERY BYPASS GRAFTING (CABG) using LIMA to LAD. (N/A) TRANSESOPHAGEAL ECHOCARDIOGRAM (TEE) (N/A) Subjective: Left chest pain  Objective: Vital signs in last 24 hours: Temp:  [98 F (36.7 C)-99.5 F (37.5 C)] 99.1 F (37.3 C) (12/05 0733) Pulse Rate:  [68-87] 75 (12/05 0733) Cardiac Rhythm: Ventricular paced (12/05 0400) Resp:  [12-24] 15 (12/05 0733) BP: (116-171)/(65-93) 122/76 (12/05 0733) SpO2:  [90 %-99 %] 95 % (12/05 0700) Arterial Line BP: (131-141)/(53) 131/53 (12/04 0900) Weight:  [68 kg] 68 kg (12/05 0500)  Hemodynamic parameters for last 24 hours: PAP: (27)/(18) 27/18  Intake/Output from previous day: 12/04 0701 - 12/05 0700 In: 1247.2 [P.O.:1000; I.V.:47.2; IV Piggyback:200] Out: 2015 [Urine:1835; Chest Tube:180] Intake/Output this shift: No intake/output data recorded.  General appearance: alert and cooperative Neurologic: intact Heart: regular rate and rhythm, S1, S2 normal, no murmur, click, rub or gallop Lungs: clear to auscultation bilaterally Extremities: minimal edema Wound: c/d/i  Lab Results: Recent Labs    11/25/19 1623 11/26/19 0303  WBC 9.8 10.1  HGB 10.8* 10.1*  HCT 32.2* 29.6*  PLT 88* 81*   BMET:  Recent Labs    11/25/19 1623 11/26/19 0303  NA 134* 135  K 4.2 3.8  CL 100 100  CO2 26 28  GLUCOSE 155* 127*  BUN 18 15  CREATININE 0.82 0.65  CALCIUM 8.1* 7.9*    PT/INR:  Recent Labs    11/24/19 1347  LABPROT 18.0*  INR 1.5*   ABG    Component Value Date/Time   PHART 7.349 (L) 11/24/2019 1845   HCO3 24.2 11/24/2019 1845   TCO2 26 11/24/2019 1845   ACIDBASEDEF 1.0 11/24/2019 1845   O2SAT 99.0 11/24/2019 1845   CBG (last 3)  Recent Labs    11/25/19 2343 11/26/19 0428 11/26/19 0731  GLUCAP 129* 127* 144*    Assessment/Plan: S/P Procedure(s) (LRB): AORTIC VALVE REPLACEMENT  (AVR) using INSPIRIS Resilia 23 MM Bioprosthetic Aortic Valve. (N/A) CORONARY ARTERY BYPASS GRAFTING (CABG) using LIMA to LAD. (N/A) TRANSESOPHAGEAL ECHOCARDIOGRAM (TEE) (N/A) Mobilize Diuresis remove chest tube, foley  Stay in ICU until rhythm resolves or PPM   LOS: 2 days    Wonda Olds 11/26/2019

## 2019-11-26 NOTE — Progress Notes (Signed)
Patient Name: Gregory [name]      SUBJECTIVE 68 y.o. male with a history of CAD s/p CABG 11/24/2019, severe aortic stenosis s/p AVR 11/24/2019, HTN, PAD, Tobacco abuse, HLD, and chronic RBBB seen today for  CHB s/p CABG/AVR  .  History of CAD s/p CABG 11/24/2019, severe aortic stenosis s/p AVR 11/24/2019, HTN, PAD, Tobacco abuse, HLD, and chronic RBBB   Feels quite good. Past Medical History:  Diagnosis Date  . Alcohol abuse   . Allergy   . Aortic stenosis   . Arthritis   . Asthma   . Bipolar disorder ([place])   . CAD (coronary artery disease)    coronary calcifications 08/2013 CTA  . Carotid artery occlusion   . Chronic back pain   . Claudication ([place])   . Depression   . Family history of skin cancer   . Heart murmur   . Hepatitis    h/o 1970,doesn't remember which type,1990 epstain-barr  . Hyperlipidemia   . Hypertension   . Neuromuscular disorder ([place])   . Obsessive compulsive disorder   . PAD (peripheral artery disease) ([place])   . Peripheral neuropathy   . Pneumonia   . RBBB   . 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 Severe aortic stenosis   . Sleep apnea    does not wear CPAP  . Smoker   . Spinal stenosis at L4-L5 level   . Tobacco abuse   . Tobacco use disorder     Scheduled Meds:  Scheduled Meds: . acetaminophen  1,000 mg Oral Q6H   Or  . acetaminophen (TYLENOL) oral liquid 160 mg/5 mL  1,000 mg Per Tube Q6H  . amLODipine  5 mg Oral Daily  . aspirin EC  325 mg Oral Daily   Or  . aspirin  324 mg Per Tube Daily  . atorvastatin  80 mg Oral Daily  . bisacodyl  10 mg Oral Daily   Or  . bisacodyl  10 mg Rectal Daily  . Chlorhexidine Gluconate Cloth  6 each Topical Daily  . divalproex  500 mg Oral QHS  . docusate sodium  200 mg Oral Daily  . finasteride  5 mg Oral Daily  . fluticasone  2 spray Each Nare Daily  . insulin aspart  0-24 Units Subcutaneous Q4H  . lamoTRIgine  200 mg Oral q morning - 10a  .  lisinopril  10 mg Oral Daily  . mouth rinse  15 mL Mouth Rinse BID  . mometasone-formoterol  2 puff Inhalation BID  . pantoprazole  40 mg Oral Daily  . sodium chloride flush  10-40 mL Intracatheter Q12H  . sodium chloride flush  3 mL Intravenous Q12H   Continuous Infusions: . sodium chloride Stopped (11/25/19 0920)  . sodium chloride    . sodium chloride    . cefUROXime (ZINACEF)  IV Stopped (11/26/19 0350)  . lactated ringers    . nitroGLYCERIN Stopped (11/25/19 0801)   sodium chloride, levalbuterol, morphine injection, ondansetron (ZOFRAN) IV, sodium chloride flush, sodium chloride flush, traMADol    PHYSICAL EXAM Vitals:   11/26/19 0600 11/26/19 0700 11/26/19 0733 11/26/19 1139  BP:  122/76 122/76 (!) 156/76  Pulse:  87 75 80  Resp: (!) 24 (!) 23 15 14   Temp:   99.1 F (37.3 C) 99.1 F (37.3 C)  TempSrc:   Oral Oral  SpO2:  95%    Weight:      Height:  BP (!) 156/76 (BP Location: Right Arm)   Pulse 80   Temp 99.1 F (37.3 C) (Oral)   Resp 14   Ht 5' 5.5" (1.664 m)   Wt 68 kg   SpO2 95%   BMI 24.57 kg/m  Well developed and nourished in no acute distress HENT normal Neck supple with JVP  Clear Regular rate and rhythm, no murmurs or gallops Abd-  No Clubbing cyanosis edema Skin-warm and dry A & Oriented  Grossly normal sensory and motor function  Telemetry demonstrates P synchronous pacing Personally reviewed       Intake/Output Summary (Last 24 hours) at 11/26/2019 1148 Last data filed at 11/26/2019 0743 Gross per 24 hour  Intake 980 ml  Output 2055 ml  Net -1075 ml    LABS: Basic Metabolic Panel: Recent Labs  Lab 11/22/19 1144  11/24/19 1022 11/24/19 1123 11/24/19 1219 11/24/19 1345 11/24/19 1734 11/24/19 1845  11/24/19 1934 11/25/19 0343 11/25/19 1623 11/26/19 0303  NA 140   < > 135 136 137 142 140 140  --  138 136 134* 135  K 4.4   < > 4.8 5.3* 4.6 4.0 4.6 4.8  --  4.9 4.8 4.2 3.8  CL 104   < > 100 102 102  --   --   --   --   109 104 100 100  CO2 20*  --   --   --   --   --   --   --   --  23 24 26 28   GLUCOSE 90   < > 105* 135* 129*  --   --   --   --  120* 125* 155* 127*  BUN 12   < > 12 11 12   --   --   --   --  10 13 18 15   CREATININE 0.87   < > 0.50* 0.50* 0.60*  --   --   --   --  0.80 0.72 0.82 0.65  CALCIUM 9.4  --   --   --   --   --   --   --   --  7.7* 7.7* 8.1* 7.9*  MG  --   --   --   --   --   --   --   --    < > 3.1* 2.6* 2.7*  --    < > = values in this interval not displayed.   Cardiac Enzymes: No results for input(s): CKTOTAL, CKMB, CKMBINDEX, TROPONINI in the last 72 hours. CBC: Recent Labs  Lab 11/22/19 1144  11/24/19 1120  11/24/19 1347 11/24/19 1734 11/24/19 1845 11/24/19 1934 11/25/19 0343 11/25/19 1623 11/26/19 0303  WBC 6.6  --   --   --  8.4  --   --  8.2 8.4 9.8 10.1  HGB 14.4   < > 9.9*   < > 10.9* 10.2* 10.5* 11.4* 10.9* 10.8* 10.1*  HCT 42.6   < > 28.7*   < > 31.9* 30.0* 31.0* 33.3* 32.3* 32.2* 29.6*  MCV 92.4  --   --   --  90.4  --   --  91.0 92.3 92.8 91.6  PLT 165  --  124*  --  87*  --   --  112* 91* 88* 81*   < > = values in this interval not displayed.   PROTIME: Recent Labs    11/24/19 1347  LABPROT 18.0*  INR 1.5*   Liver Function Tests:  No results for input(s): AST, ALT, ALKPHOS, BILITOT, PROT, ALBUMIN in the last 72 hours. No results for input(s): LIPASE, AMYLASE in the last 72 hours. BNP: BNP (last 3 results) No results for input(s): BNP in the last 8760 hours.  ProBNP (last 3 results) No results for input(s): PROBNP in the last 8760 hours.  D-Dimer: No results for input(s): DDIMER in the last 72 hours. Hemoglobin A1C: No results for input(s): HGBA1C in the last 72 hours. Fasting Lipid Panel: No results for input(s): CHOL, HDL, LDLCALC, TRIG, CHOLHDL, LDLDIRECT in the last 72 hours. Thyroid Function Tests: No results for input(s): TSH, T4TOTAL, T3FREE, THYROIDAB in the last 72 hours.  Invalid input(s): FREET3 Anemia Panel: No results for  input(s): VITAMINB12, FOLATE, FERRITIN, TIBC, IRON, RETICCTPCT in the last 72 hours.     ASSESSMENT AND PLAN:  Complete heart block  Aortic valve replacement/CABG  The patient has persistent complete heart block now on D3.  It is unlikely if by day 5 he has not had recovery of sinus rhythm.  Currently we are anticipating pacemaker implantation on Monday if there is no interval recovery.  Reviewed with patient    Signed,   Virl Axe MD  11/26/2019

## 2019-11-27 ENCOUNTER — Inpatient Hospital Stay (HOSPITAL_COMMUNITY): Payer: PPO

## 2019-11-27 DIAGNOSIS — I4510 Unspecified right bundle-branch block: Secondary | ICD-10-CM

## 2019-11-27 DIAGNOSIS — I442 Atrioventricular block, complete: Secondary | ICD-10-CM | POA: Diagnosis not present

## 2019-11-27 DIAGNOSIS — Z953 Presence of xenogenic heart valve: Secondary | ICD-10-CM | POA: Diagnosis not present

## 2019-11-27 LAB — GLUCOSE, CAPILLARY
Glucose-Capillary: 91 mg/dL (ref 70–99)
Glucose-Capillary: 122 mg/dL — ABNORMAL HIGH (ref 70–99)
Glucose-Capillary: 109 mg/dL — ABNORMAL HIGH (ref 70–99)
Glucose-Capillary: 133 mg/dL — ABNORMAL HIGH (ref 70–99)

## 2019-11-27 LAB — BASIC METABOLIC PANEL
Anion gap: 8 (ref 5–15)
BUN: 16 mg/dL (ref 8–23)
CO2: 26 mmol/L (ref 22–32)
Calcium: 8.3 mg/dL — ABNORMAL LOW (ref 8.9–10.3)
Chloride: 101 mmol/L (ref 98–111)
Creatinine, Ser: 0.79 mg/dL (ref 0.61–1.24)
GFR calc Af Amer: >60 mL/min (ref >60)
GFR calc non Af Amer: >60 mL/min (ref >60)
Glucose, Bld: 103 mg/dL — ABNORMAL HIGH (ref 70–99)
Potassium: 4.6 mmol/L (ref 3.5–5.1)
Sodium: 135 mmol/L (ref 135–145)

## 2019-11-27 LAB — CBC WITH DIFFERENTIAL/PLATELET
Abs Immature Granulocytes: 0.05 10*3/uL (ref 0.00–0.07)
Basophils Absolute: 0 10*3/uL (ref 0.0–0.1)
Basophils Relative: 0 %
Eosinophils Absolute: 0.1 10*3/uL (ref 0.0–0.5)
Eosinophils Relative: 1 %
HCT: 30.3 % — ABNORMAL LOW (ref 39.0–52.0)
Hemoglobin: 10.2 g/dL — ABNORMAL LOW (ref 13.0–17.0)
Immature Granulocytes: 1 %
Lymphocytes Relative: 24 %
Lymphs Abs: 2.5 10*3/uL (ref 0.7–4.0)
MCH: 31.5 pg (ref 26.0–34.0)
MCHC: 33.7 g/dL (ref 30.0–36.0)
MCV: 93.5 fL (ref 80.0–100.0)
Monocytes Absolute: 1.3 10*3/uL — ABNORMAL HIGH (ref 0.1–1.0)
Monocytes Relative: 13 %
Neutro Abs: 6.3 10*3/uL (ref 1.7–7.7)
Neutrophils Relative %: 61 %
Platelets: 110 10*3/uL — ABNORMAL LOW (ref 150–400)
RBC: 3.24 MIL/uL — ABNORMAL LOW (ref 4.22–5.81)
RDW: 14.2 % (ref 11.5–15.5)
WBC: 10.2 10*3/uL (ref 4.0–10.5)
nRBC: 0 % (ref 0.0–0.2)

## 2019-11-27 MED ORDER — AMLODIPINE BESYLATE 10 MG PO TABS
10.0000 mg | ORAL_TABLET | Freq: Every day | ORAL | Status: DC
  Administered 2019-11-28 – 2019-11-30 (×3): 10 mg via ORAL
  Filled 2019-11-27 (×3): qty 1

## 2019-11-27 MED ORDER — LISINOPRIL 10 MG PO TABS
10.0000 mg | ORAL_TABLET | Freq: Two times a day (BID) | ORAL | Status: DC
  Administered 2019-11-27 – 2019-11-28 (×4): 10 mg via ORAL
  Filled 2019-11-27 (×5): qty 1

## 2019-11-27 MED ORDER — FUROSEMIDE 40 MG PO TABS
40.0000 mg | ORAL_TABLET | Freq: Every day | ORAL | Status: DC
  Administered 2019-11-27 – 2019-11-30 (×4): 40 mg via ORAL
  Filled 2019-11-27 (×4): qty 1

## 2019-11-27 NOTE — Progress Notes (Signed)
3 Days Post-Op Procedure(s) (LRB): AORTIC VALVE REPLACEMENT (AVR) using INSPIRIS Resilia 23 MM Bioprosthetic Aortic Valve. (N/A) CORONARY ARTERY BYPASS GRAFTING (CABG) using LIMA to LAD. (N/A) TRANSESOPHAGEAL ECHOCARDIOGRAM (TEE) (N/A) Subjective: No complaints  Objective: Vital signs in last 24 hours: Temp:  [98.3 F (36.8 C)-99.1 F (37.3 C)] 98.5 F (36.9 C) (12/06 0732) Pulse Rate:  [68-97] 68 (12/06 0400) Cardiac Rhythm: Ventricular paced (12/06 0000) Resp:  [12-21] 12 (12/06 0400) BP: (103-162)/(59-88) 103/64 (12/06 0400) SpO2:  [86 %-95 %] 93 % (12/06 0400) Weight:  [69.6 kg] 69.6 kg (12/06 0500)  Hemodynamic parameters for last 24 hours:    Intake/Output from previous day: 12/05 0701 - 12/06 0700 In: 480 [P.O.:480] Out: 220 [Urine:220] Intake/Output this shift: No intake/output data recorded.  General appearance: alert and cooperative Neurologic: intact Heart: regular rate and rhythm, S1, S2 normal, no murmur, click, rub or gallop Lungs: clear to auscultation bilaterally Abdomen: soft, non-tender; bowel sounds normal; no masses,  no organomegaly Extremities: extremities normal, atraumatic, no cyanosis or edema Wound: c/d/i  Lab Results: Recent Labs    11/25/19 1623 11/26/19 0303  WBC 9.8 10.1  HGB 10.8* 10.1*  HCT 32.2* 29.6*  PLT 88* 81*   BMET:  Recent Labs    11/26/19 0303 11/27/19 0500  NA 135 135  K 3.8 4.6  CL 100 101  CO2 28 26  GLUCOSE 127* 103*  BUN 15 16  CREATININE 0.65 0.79  CALCIUM 7.9* 8.3*    PT/INR:  Recent Labs    11/24/19 1347  LABPROT 18.0*  INR 1.5*   ABG    Component Value Date/Time   PHART 7.349 (L) 11/24/2019 1845   HCO3 24.2 11/24/2019 1845   TCO2 26 11/24/2019 1845   ACIDBASEDEF 1.0 11/24/2019 1845   O2SAT 99.0 11/24/2019 1845   CBG (last 3)  Recent Labs    11/26/19 1543 11/26/19 2107 11/27/19 0648  GLUCAP 106* 102* 91    Assessment/Plan: S/P Procedure(s) (LRB): AORTIC VALVE REPLACEMENT (AVR)  using INSPIRIS Resilia 23 MM Bioprosthetic Aortic Valve. (N/A) CORONARY ARTERY BYPASS GRAFTING (CABG) using LIMA to LAD. (N/A) TRANSESOPHAGEAL ECHOCARDIOGRAM (TEE) (N/A) Mobilize Diuresis he is no longer pacer dependent   LOS: 3 days    Gregory [name] 11/27/2019

## 2019-11-27 NOTE — Plan of Care (Signed)
  Problem: Clinical Measurements: Goal: Respiratory complications will improve Outcome: Progressing   Problem: Clinical Measurements: Goal: Cardiovascular complication will be avoided Outcome: Progressing   Problem: Pain Managment: Goal: General experience of comfort will improve Outcome: Progressing   

## 2019-11-27 NOTE — Progress Notes (Signed)
TCTS Evening Rounds  Stable day Vitals:   11/27/19 1610 11/27/19 1650  BP:  116/74  Pulse:  100  Resp: 19 20  Temp:    SpO2: 96% 93%   Date 11/27/19 0701 - 11/28/19 0700  Shift 0701-1900 1901-0700 24 Hour Total  INTAKE  P.O. 1270  1270    P.O. 1270  1270  Shift Total(mL/kg) [id number]  [id number]  OUTPUT  Urine(mL/kg/hr) 200  200    Urine 200  200    Urine Occurrence 1 x  1 x  Shift Total(mL/kg) 200(2.9)  200(2.9)  NET 1070  1070  Weight (kg) 69.6 69.6 69.6   Exam unchanged  A/p: increase norvasc for HTN Await decision on PPM  Jaylene Arrowood Z. Orvan Seen, [place]

## 2019-11-27 NOTE — Plan of Care (Signed)
  Problem: Clinical Measurements: Goal: Cardiovascular complication will be avoided Outcome: Progressing   Problem: Clinical Measurements: Goal: Respiratory complications will improve Outcome: Progressing   Problem: Coping: Goal: Level of anxiety will decrease Outcome: Progressing

## 2019-11-27 NOTE — Progress Notes (Signed)
Patient Name: Gregory [name]      SUBJECTIVE 68 y.o. male with a history of CAD s/p CABG 11/24/2019, severe aortic stenosis s/p AVR 11/24/2019, HTN, PAD, Tobacco abuse, HLD, and chronic RBBB seen today for  CHB s/p CABG/AVR  .  History of CAD s/p CABG 11/24/2019, severe aortic stenosis s/p AVR 11/24/2019, HTN, PAD, Tobacco abuse, HLD, and chronic RBBB   Feels better this morning.  . Past Medical History:  Diagnosis Date  . Alcohol abuse   . Allergy   . Aortic stenosis   . Arthritis   . Asthma   . Bipolar disorder ([place])   . CAD (coronary artery disease)    coronary calcifications 08/2013 CTA  . Carotid artery occlusion   . Chronic back pain   . Claudication ([place])   . Depression   . Family history of skin cancer   . Heart murmur   . Hepatitis    h/o 1970,doesn't remember which type,1990 epstain-barr  . Hyperlipidemia   . Hypertension   . Neuromuscular disorder ([place])   . Obsessive compulsive disorder   . PAD (peripheral artery disease) ([place])   . Peripheral neuropathy   . Pneumonia   . RBBB   . 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 Severe aortic stenosis   . Sleep apnea    does not wear CPAP  . Smoker   . Spinal stenosis at L4-L5 level   . Tobacco abuse   . Tobacco use disorder     Scheduled Meds:  Scheduled Meds: . acetaminophen  1,000 mg Oral Q6H   Or  . acetaminophen (TYLENOL) oral liquid 160 mg/5 mL  1,000 mg Per Tube Q6H  . amLODipine  5 mg Oral Daily  . aspirin EC  325 mg Oral Daily   Or  . aspirin  324 mg Per Tube Daily  . atorvastatin  80 mg Oral Daily  . bisacodyl  10 mg Oral Daily   Or  . bisacodyl  10 mg Rectal Daily  . Chlorhexidine Gluconate Cloth  6 each Topical Daily  . divalproex  500 mg Oral QHS  . docusate sodium  200 mg Oral Daily  . finasteride  5 mg Oral Daily  . fluticasone  2 spray Each Nare Daily  . furosemide  40 mg Oral Daily  . insulin aspart  0-24 Units Subcutaneous TID AC & HS  .  lamoTRIgine  200 mg Oral q morning - 10a  . lisinopril  10 mg Oral BID  . mouth rinse  15 mL Mouth Rinse BID  . mometasone-formoterol  2 puff Inhalation BID  . pantoprazole  40 mg Oral Daily  . sodium chloride flush  10-40 mL Intracatheter Q12H  . sodium chloride flush  3 mL Intravenous Q12H   Continuous Infusions: . sodium chloride Stopped (11/25/19 0920)  . sodium chloride    . sodium chloride    . lactated ringers    . nitroGLYCERIN Stopped (11/25/19 0801)   sodium chloride, levalbuterol, morphine injection, ondansetron (ZOFRAN) IV, sodium chloride flush, sodium chloride flush, traMADol    PHYSICAL EXAM Vitals:   11/27/19 0732 11/27/19 0800 11/27/19 0900 11/27/19 1000  BP:  103/73 (!) 128/59 136/72  Pulse:  79 75 79  Resp:  13 15 18   Temp: 98.5 F (36.9 C)     TempSrc: Oral     SpO2:  95% 97% 93%  Weight:      Height:  BP 136/72   Pulse 79   Temp 98.5 F (36.9 C) (Oral)   Resp 18   Ht 5' 5.5" (1.664 m)   Wt 69.6 kg   SpO2 93%   BMI 25.15 kg/m  Well developed and nourished in no acute distress HENT normal Neck supple   Clear Regular rate a nd rhythm, no murmurs or gallops Abd-soft  No Clubbing cyanosis edema Skin-warm and dry A & Oriented  Grossly normal sensory and motor function  Telemetry Personally reviewed P synchronous pacing with what appears to be pseudofusion    Intake/Output Summary (Last 24 hours) at 11/27/2019 1052 Last data filed at 11/27/2019 1000 Gross per 24 hour  Intake 960 ml  Output 100 ml  Net 860 ml    LABS: Basic Metabolic Panel: Recent Labs  Lab 11/22/19 1144  11/24/19 1123 11/24/19 1219  11/24/19 1734 11/24/19 1845  11/24/19 1934 11/25/19 0343 11/25/19 1623 11/26/19 0303 11/27/19 0500  NA 140   < > 136 137   < > 140 140  --  138 136 134* 135 135  K 4.4   < > 5.3* 4.6   < > 4.6 4.8  --  4.9 4.8 4.2 3.8 4.6  CL 104   < > 102 102  --   --   --   --  109 104 100 100 101  CO2 20*  --   --   --   --   --   --    --  23 24 26 28 26   GLUCOSE 90   < > 135* 129*  --   --   --   --  120* 125* 155* 127* 103*  BUN 12   < > 11 12  --   --   --   --  10 13 18 15 16   CREATININE 0.87   < > 0.50* 0.60*  --   --   --   --  0.80 0.72 0.82 0.65 0.79  CALCIUM 9.4  --   --   --   --   --   --   --  7.7* 7.7* 8.1* 7.9* 8.3*  MG  --   --   --   --   --   --   --    < > 3.1* 2.6* 2.7*  --   --    < > = values in this interval not displayed.   Cardiac Enzymes: No results for input(s): CKTOTAL, CKMB, CKMBINDEX, TROPONINI in the last 72 hours. CBC: Recent Labs  Lab 11/22/19 1144  11/24/19 1120  11/24/19 1347 11/24/19 1734 11/24/19 1845 11/24/19 1934 11/25/19 0343 11/25/19 1623 11/26/19 0303 11/27/19 0500  WBC 6.6  --   --   --  8.4  --   --  8.2 8.4 9.8 10.1 10.2  NEUTROABS  --   --   --   --   --   --   --   --   --   --   --  6.3  HGB 14.4   < > 9.9*   < > 10.9* 10.2* 10.5* 11.4* 10.9* 10.8* 10.1* 10.2*  HCT 42.6   < > 28.7*   < > 31.9* 30.0* 31.0* 33.3* 32.3* 32.2* 29.6* 30.3*  MCV 92.4  --   --   --  90.4  --   --  91.0 92.3 92.8 91.6 93.5  PLT 165  --  124*  --  87*  --   --  112* 91* 88* 81* 110*   < > = values in this interval not displayed.   PROTIME: Recent Labs    11/24/19 1347  LABPROT 18.0*  INR 1.5*   Liver Function Tests: No results for input(s): AST, ALT, ALKPHOS, BILITOT, PROT, ALBUMIN in the last 72 hours. No results for input(s): LIPASE, AMYLASE in the last 72 hours. BNP: BNP (last 3 results) No results for input(s): BNP in the last 8760 hours.  ProBNP (last 3 results) No results for input(s): PROBNP in the last 8760 hours.  D-Dimer: No results for input(s): DDIMER in the last 72 hours. Hemoglobin A1C: No results for input(s): HGBA1C in the last 72 hours. Fasting Lipid Panel: No results for input(s): CHOL, HDL, LDLCALC, TRIG, CHOLHDL, LDLDIRECT in the last 72 hours. Thyroid Function Tests: No results for input(s): TSH, T4TOTAL, T3FREE, THYROIDAB in the last 72 hours.   Invalid input(s): FREET3 Anemia Panel: No results for input(s): VITAMINB12, FOLATE, FERRITIN, TIBC, IRON, RETICCTPCT in the last 72 hours.     ASSESSMENT AND PLAN:  Complete heart block recovered  Aortic valve replacement/CABG  Right bundle branch block  The patient has recovery of complete heart block now on day 3.  Will observe overnight with a AV delay programmed at 300 ms which should allow for [place] to identify intermittent heart block by changing QRS morphology from right bundle branch block--left bundle branch block.  Based on TAVR experience, in the event that pacing is not undertaken, would think with Dr. Cyndia Bent about the use of a ZIO AT monitor which allows for real-time telemetry in the identification of worsening of conduction.  We will keep him n.p.o. after clear liquid breakfast until I have a chance to discuss with Dr. [name] thoughts.    An important negative predictor is the absence of concomitant first-degree AV block  Reviewed with patient    Signed,   Virl Axe MD  11/27/2019

## 2019-11-28 ENCOUNTER — Inpatient Hospital Stay (HOSPITAL_COMMUNITY): Payer: PPO

## 2019-11-28 ENCOUNTER — Inpatient Hospital Stay (HOSPITAL_COMMUNITY)
Admission: RE | Disposition: A | Discharge status: Home / Self Care | Payer: Self-pay | Source: Home / Self Care | Attending: Surgery

## 2019-11-28 DIAGNOSIS — I442 Atrioventricular block, complete: Secondary | ICD-10-CM | POA: Diagnosis not present

## 2019-11-28 DIAGNOSIS — I4510 Unspecified right bundle-branch block: Secondary | ICD-10-CM | POA: Diagnosis not present

## 2019-11-28 DIAGNOSIS — I350 Nonrheumatic aortic (valve) stenosis: Secondary | ICD-10-CM | POA: Diagnosis not present

## 2019-11-28 DIAGNOSIS — Z953 Presence of xenogenic heart valve: Secondary | ICD-10-CM | POA: Diagnosis not present

## 2019-11-28 LAB — CBC WITH DIFFERENTIAL/PLATELET
Abs Immature Granulocytes: 0.02 10*3/uL (ref 0.00–0.07)
Basophils Absolute: 0 10*3/uL (ref 0.0–0.1)
Basophils Relative: 0 %
Eosinophils Absolute: 0.1 10*3/uL (ref 0.0–0.5)
Eosinophils Relative: 1 %
HCT: 27.7 % — ABNORMAL LOW (ref 39.0–52.0)
Hemoglobin: 9.2 g/dL — ABNORMAL LOW (ref 13.0–17.0)
Immature Granulocytes: 0 %
Lymphocytes Relative: 27 %
Lymphs Abs: 1.7 10*3/uL (ref 0.7–4.0)
MCH: 31.2 pg (ref 26.0–34.0)
MCHC: 33.2 g/dL (ref 30.0–36.0)
MCV: 93.9 fL (ref 80.0–100.0)
Monocytes Absolute: 1.1 10*3/uL — ABNORMAL HIGH (ref 0.1–1.0)
Monocytes Relative: 16 %
Neutro Abs: 3.6 10*3/uL (ref 1.7–7.7)
Neutrophils Relative %: 56 %
Platelets: 123 10*3/uL — ABNORMAL LOW (ref 150–400)
RBC: 2.95 MIL/uL — ABNORMAL LOW (ref 4.22–5.81)
RDW: 14.3 % (ref 11.5–15.5)
WBC: 6.6 10*3/uL (ref 4.0–10.5)
nRBC: 0 % (ref 0.0–0.2)

## 2019-11-28 LAB — BASIC METABOLIC PANEL
Anion gap: 9 (ref 5–15)
BUN: 12 mg/dL (ref 8–23)
CO2: 29 mmol/L (ref 22–32)
Calcium: 8.1 mg/dL — ABNORMAL LOW (ref 8.9–10.3)
Chloride: 101 mmol/L (ref 98–111)
Creatinine, Ser: 0.73 mg/dL (ref 0.61–1.24)
GFR calc Af Amer: >60 mL/min (ref >60)
GFR calc non Af Amer: >60 mL/min (ref >60)
Glucose, Bld: 94 mg/dL (ref 70–99)
Potassium: 4.2 mmol/L (ref 3.5–5.1)
Sodium: 139 mmol/L (ref 135–145)

## 2019-11-28 LAB — GLUCOSE, CAPILLARY: Glucose-Capillary: 92 mg/dL (ref 70–99)

## 2019-11-28 SURGERY — PACEMAKER IMPLANT

## 2019-11-28 MED ORDER — ONDANSETRON HCL 4 MG/2ML IJ SOLN: 4.0000 mg | Freq: Four times a day (QID) | INTRAMUSCULAR | Status: DC | PRN

## 2019-11-28 MED ORDER — POTASSIUM CHLORIDE CRYS ER 20 MEQ PO TBCR
20.0000 meq | EXTENDED_RELEASE_TABLET | Freq: Every day | ORAL | Status: DC
  Administered 2019-11-29 – 2019-11-30 (×2): 20 meq via ORAL
  Filled 2019-11-28 (×2): qty 1

## 2019-11-28 MED ORDER — SODIUM CHLORIDE 0.9 % IV SOLN: 250.0000 mL | INTRAVENOUS | Status: DC | PRN

## 2019-11-28 MED ORDER — PANTOPRAZOLE SODIUM 40 MG PO TBEC
40.0000 mg | DELAYED_RELEASE_TABLET | Freq: Every day | ORAL | Status: DC
  Administered 2019-11-29 – 2019-11-30 (×2): 40 mg via ORAL
  Filled 2019-11-28 (×2): qty 1

## 2019-11-28 MED ORDER — SODIUM CHLORIDE 0.9% FLUSH
3.0000 mL | Freq: Two times a day (BID) | INTRAVENOUS | Status: DC
  Administered 2019-11-28 – 2019-11-30 (×4): 3 mL via INTRAVENOUS

## 2019-11-28 MED ORDER — ACETAMINOPHEN 325 MG PO TABS: 650.0000 mg | ORAL_TABLET | Freq: Four times a day (QID) | ORAL | Status: DC | PRN

## 2019-11-28 MED ORDER — TRAMADOL HCL 50 MG PO TABS
50.0000 mg | ORAL_TABLET | Freq: Four times a day (QID) | ORAL | Status: DC | PRN
  Administered 2019-11-29 – 2019-11-30 (×3): 50 mg via ORAL
  Filled 2019-11-28 (×3): qty 1

## 2019-11-28 MED ORDER — ~~LOC~~ CARDIAC SURGERY, PATIENT & FAMILY EDUCATION
Freq: Once | Status: AC
  Administered 2019-11-28: 22:00:00

## 2019-11-28 MED ORDER — ONDANSETRON HCL 4 MG PO TABS: 4.0000 mg | ORAL_TABLET | Freq: Four times a day (QID) | ORAL | Status: DC | PRN

## 2019-11-28 MED ORDER — ASPIRIN EC 325 MG PO TBEC
325.0000 mg | DELAYED_RELEASE_TABLET | Freq: Every day | ORAL | Status: DC
  Administered 2019-11-29 – 2019-11-30 (×2): 325 mg via ORAL
  Filled 2019-11-28 (×2): qty 1

## 2019-11-28 MED ORDER — SODIUM CHLORIDE 0.9% FLUSH: 3.0000 mL | INTRAVENOUS | Status: DC | PRN

## 2019-11-28 NOTE — Progress Notes (Signed)
Patient arrived from 2 heart. Patient vital signs obtained and CHG done. Patient chair eating supper. Will monitor patient. Janiyah Beery, Bettina Gavia RN

## 2019-11-28 NOTE — Progress Notes (Signed)
CARDIAC REHAB PHASE I   PRE:  Rate/Rhythm: 95 SR  BP:  Supine:   Sitting: 110/79  Standing:    SaO2: 86-87%RA  MODE:  Ambulation: 560 ft   POST:  Rate/Rhythm: 102 ST  BP:  Supine:   Sitting: 194/71, 165/66  Standing:    SaO2: 96% 3L 1020-1102 Pt put on 3L to keep sats up . Desat after going to bathroom on RA. Pt walked 560 ft on 3L with rolling walker and asst x 2. Pacer intact. Pt sounds congested at times. Pt encouraged to use IS and walk for pulmonary toilet. May benefit from flutter valve. Pt holds pillow when coughing. RN to ask respiratory for flutter valve. Second walk today.   Graylon Good, RN BSN  11/28/2019 10:58 AM

## 2019-11-28 NOTE — Progress Notes (Signed)
4 Days Post-Op Procedure(s) (LRB): AORTIC VALVE REPLACEMENT (AVR) using INSPIRIS Resilia 23 MM Bioprosthetic Aortic Valve. (N/A) CORONARY ARTERY BYPASS GRAFTING (CABG) using LIMA to LAD. (N/A) TRANSESOPHAGEAL ECHOCARDIOGRAM (TEE) (N/A) Subjective: No complaints. Bowels working.  Objective: Vital signs in last 24 hours: Temp:  [98.1 F (36.7 C)-99.6 F (37.6 C)] 98.1 F (36.7 C) (12/07 0759) Pulse Rate:  [67-100] 74 (12/07 0800) Cardiac Rhythm: Normal sinus rhythm (12/07 0800) Resp:  [11-22] 18 (12/07 0800) BP: (103-161)/(54-123) 146/63 (12/07 0800) SpO2:  [90 %-97 %] 97 % (12/07 0800) Weight:  [67.6 kg] 67.6 kg (12/07 0457)  Hemodynamic parameters for last 24 hours:    Intake/Output from previous day: 12/06 0701 - 12/07 0700 In: 1628 [P.O.:1628] Out: 904 [Urine:900; Stool:4] Intake/Output this shift: Total I/O In: 222 [P.O.:222] Out: -   General appearance: alert and cooperative Neurologic: intact Heart: regular rate and rhythm, S1, S2 normal, no murmur, click, rub or gallop Lungs: diminished breath sounds bibasilar Extremities: edema trace in legs Wound: incision ok  Lab Results: Recent Labs    11/27/19 0500 11/28/19 0404  WBC 10.2 6.6  HGB 10.2* 9.2*  HCT 30.3* 27.7*  PLT 110* 123*   BMET:  Recent Labs    11/27/19 0500 11/28/19 0404  NA 135 139  K 4.6 4.2  CL 101 101  CO2 26 29  GLUCOSE 103* 94  BUN 16 12  CREATININE 0.79 0.73  CALCIUM 8.3* 8.1*    PT/INR: No results for input(s): LABPROT, INR in the last 72 hours. ABG    Component Value Date/Time   PHART 7.349 (L) 11/24/2019 1845   HCO3 24.2 11/24/2019 1845   TCO2 26 11/24/2019 1845   ACIDBASEDEF 1.0 11/24/2019 1845   O2SAT 99.0 11/24/2019 1845   CBG (last 3)  Recent Labs    11/27/19 1608 11/27/19 2113 11/28/19 0635  GLUCAP 109* 133* 92   CXR: bilateral lower lobe atelectasis and small pleural effusions.  Assessment/Plan: S/P Procedure(s) (LRB): AORTIC VALVE REPLACEMENT (AVR)  using INSPIRIS Resilia 23 MM Bioprosthetic Aortic Valve. (N/A) CORONARY ARTERY BYPASS GRAFTING (CABG) using LIMA to LAD. (N/A) TRANSESOPHAGEAL ECHOCARDIOGRAM (TEE) (N/A)  POD 4 Hemodynamically stable in sinus rhythm. Monitor reviewed. No second or third degree heart block. Pacer not warranted at this time and ECG yesterday looked the same as preop with RBBB. Will discuss with Dr. Caryl Comes. I think putting a ZIO AT monitor on him would be a good idea. No beta blocker for now. Keep pacer wires in for now.  DC neck sleeve.  Continue diuresis  IS, ambulation.  Transfer to 4E.   LOS: 4 days    Gaye Pollack 11/28/2019

## 2019-11-28 NOTE — Progress Notes (Addendum)
Progress Note  Patient Name: Gregory [name] Date of Encounter: 11/28/2019  Primary Cardiologist: Dr. [name]  Subjective   Less winded every day with ambulation, no anginal complaints  Inpatient Medications    Scheduled Meds: . acetaminophen  1,000 mg Oral Q6H   Or  . acetaminophen (TYLENOL) oral liquid 160 mg/5 mL  1,000 mg Per Tube Q6H  . amLODipine  10 mg Oral Daily  . aspirin EC  325 mg Oral Daily   Or  . aspirin  324 mg Per Tube Daily  . atorvastatin  80 mg Oral Daily  . bisacodyl  10 mg Oral Daily   Or  . bisacodyl  10 mg Rectal Daily  . Chlorhexidine Gluconate Cloth  6 each Topical Daily  . divalproex  500 mg Oral QHS  . docusate sodium  200 mg Oral Daily  . finasteride  5 mg Oral Daily  . fluticasone  2 spray Each Nare Daily  . furosemide  40 mg Oral Daily  . insulin aspart  0-24 Units Subcutaneous TID AC & HS  . lamoTRIgine  200 mg Oral q morning - 10a  . lisinopril  10 mg Oral BID  . mouth rinse  15 mL Mouth Rinse BID  . mometasone-formoterol  2 puff Inhalation BID  . pantoprazole  40 mg Oral Daily  . sodium chloride flush  10-40 mL Intracatheter Q12H  . sodium chloride flush  3 mL Intravenous Q12H   Continuous Infusions: . sodium chloride Stopped (11/25/19 0920)  . sodium chloride    . sodium chloride    . lactated ringers     PRN Meds: sodium chloride, levalbuterol, ondansetron (ZOFRAN) IV, sodium chloride flush, sodium chloride flush, traMADol   Vital Signs    Vitals:   11/28/19 0457 11/28/19 0759 11/28/19 0800 11/28/19 0900  BP:   (!) 146/63 (!) 160/68  Pulse:   74 73  Resp:   18 17  Temp:  98.1 F (36.7 C)    TempSrc:  Oral    SpO2:   97% 91%  Weight: 67.6 kg     Height:        Intake/Output Summary (Last 24 hours) at 11/28/2019 0926 Last data filed at 11/28/2019 0800 Gross per 24 hour  Intake 1610 ml  Output 904 ml  Net 706 ml   Last 3 Weights 11/28/2019 11/27/2019 11/26/2019  Weight (lbs) 149 lb 0.5 oz 153 lb 7 oz 149 lb 14.6  oz  Weight (kg) 67.6 kg 69.6 kg 68 kg      Telemetry    SR, no pacing, no bradycardia - Personally Reviewed  ECG    Yesterday is SR 72 bpm,  RBBB, PR 133ms - Personally Reviewed   Physical Exam    OOB to chair, feeling well GEN: No acute distress.   Neck: No JVD Cardiac: RRR, soft SM, rubs, or gallops.  Respiratory: soft scattered ronchi GI: Soft, nontender, non-distended  MS: No edema; No deformity. Neuro:  Nonfocal  Psych: Normal affect, very pleasant, talkative  Labs    High Sensitivity Troponin:  No results for input(s): TROPONINIHS in the last 720 hours.    Chemistry Recent Labs  Lab 11/22/19 1144  11/26/19 0303 11/27/19 0500 11/28/19 0404  NA 140   < > 135 135 139  K 4.4   < > 3.8 4.6 4.2  CL 104   < > 100 101 101  CO2 20*   < > 28 26 29   GLUCOSE 90   < >  127* 103* 94  BUN 12   < > 15 16 12   CREATININE 0.87   < > 0.65 0.79 0.73  CALCIUM 9.4   < > 7.9* 8.3* 8.1*  PROT 7.1  --   --   --   --   ALBUMIN 3.9  --   --   --   --   AST 30  --   --   --   --   ALT 38  --   --   --   --   ALKPHOS 48  --   --   --   --   BILITOT 0.6  --   --   --   --   GFRNONAA >60   < > >60 >60 >60  GFRAA >60   < > >60 >60 >60  ANIONGAP 16*   < > 7 8 9    < > = values in this interval not displayed.     Hematology Recent Labs  Lab 11/26/19 0303 11/27/19 0500 11/28/19 0404  WBC 10.1 10.2 6.6  RBC 3.23* 3.24* 2.95*  HGB 10.1* 10.2* 9.2*  HCT 29.6* 30.3* 27.7*  MCV 91.6 93.5 93.9  MCH 31.3 31.5 31.2  MCHC 34.1 33.7 33.2  RDW 13.9 14.2 14.3  PLT 81* 110* 123*    BNPNo results for input(s): BNP, PROBNP in the last 168 hours.   DDimer No results for input(s): DDIMER in the last 168 hours.   Radiology    Dg Chest 1 View Result Date: 11/28/2019 CLINICAL DATA:  Open-heart surgery EXAM: CHEST  1 VIEW COMPARISON:  Yesterday FINDINGS: Right IJ sheath in stable position. Hazy opacity at the lower lung bases. No Kerley lines or pneumothorax. Stable cardiac enlargement.  Aortic valve replacement. IMPRESSION: Stable presumed atelectasis and pleural effusions at the lung bases. Electronically Signed   By: Monte Fantasia M.D.   On: 11/28/2019 06:16     Cardiac Studies   10/06/2019: TTE IMPRESSIONS  1. Left ventricular ejection fraction, by visual estimation, is 55 to 60%. The left ventricle has normal function. Normal left ventricular size. Left ventricular septal wall thickness was moderately increased. There is moderately increased left  ventricular hypertrophy.  2. Global right ventricle has normal systolic function.The right ventricular size is normal. No increase in right ventricular wall thickness.  3. Left atrial size was normal.  4. Right atrial size was normal.  5. Mild mitral annular calcification.  6. Moderate calcification of the mitral valve leaflet(s).  7. Moderate thickening of the mitral valve leaflet(s).  8. The mitral valve is normal in structure. Trace mitral valve regurgitation.  9. The tricuspid valve is normal in structure. Tricuspid valve regurgitation is mild. 10. The aortic valve has an indeterminant number of cusps Aortic valve regurgitation is mild by color flow Doppler. Severe aortic valve stenosis. 11. There is Severe calcifcation of the aortic valve. 12. There is Severely thickening of the aortic valve. 13. The pulmonic valve was grossly normal. Pulmonic valve regurgitation is mild by color flow Doppler.    Cardiac cath 11/22/19:  RIGHT/LEFT HEART CATH AND CORONARY ANGIOGRAPHY  Conclusion   Prox LAD to Mid LAD lesion is 99% stenosed.  Mid Cx to Dist Cx lesion is 50% stenosed.  Mid RCA lesion is 30% stenosed.  The left ventricular systolic function is normal.  LV end diastolic pressure is normal.  The left ventricular ejection fraction is 55-65% by visual estimate.  There is severe aortic valve stenosis.  1. Critical  single vessel obstructive CAD with 99% mid LAD - heavily calcified 2. Severe aortic stenosis.  Mean gradient 47 mm Hg. AVA 0.68 cm squared with index 0.39. 3. Normal LV filling pressures 4. Normal Right heart pressures 5. Normal cardiac output.  Plan: referral to CT surgery for combined AVR and CABG.      Patient Profile     68 y.o. male w/PMHx of HTN, HLD, PVD, RBBB, CAD/VHD admitted for CABG/AVR 11/24/2019  Assessment & Plan    1. Post-op CHB     POD # 4     He has regained 1:1 AV conduction at his baseline RBBB with PR 155ms  Discussed with Dr. Cyndia Bent this AM,no pacer for now He will arrange Zio AT monitoring   Avoid all potential nodal blocking agents for BP control  Dr. Caryl Comes will see the patient later today Anticipate we will follow from afar/sign off at this juncture     For questions or updates, please contact [facility] Please consult [email or web address] for contact info under        Signed, Baldwin [place], PA-C  11/28/2019, 9:26 AM    spkke with DR BB  Will use AT monitor to assess for late recurrent heart block  [facility] will sign off.   Medication Recommendations:    Other recommendations (labs, testing, etc):  As above  Follow up as an outpatient:  As scheduled

## 2019-11-29 MED ORDER — LISINOPRIL 10 MG PO TABS
20.0000 mg | ORAL_TABLET | Freq: Two times a day (BID) | ORAL | Status: DC
  Administered 2019-11-29 – 2019-11-30 (×3): 20 mg via ORAL
  Filled 2019-11-29 (×3): qty 2

## 2019-11-29 MED ORDER — BISACODYL 5 MG PO TBEC
10.0000 mg | DELAYED_RELEASE_TABLET | Freq: Every day | ORAL | Status: DC | PRN
  Administered 2019-11-29: 10 mg via ORAL
  Filled 2019-11-29: qty 2

## 2019-11-29 MED FILL — Sodium Chloride IV Soln 0.9%: INTRAVENOUS | Qty: 3000 | Status: AC

## 2019-11-29 MED FILL — Sodium Bicarbonate IV Soln 8.4%: INTRAVENOUS | Qty: 50 | Status: AC

## 2019-11-29 MED FILL — Heparin Sodium (Porcine) Inj 1000 Unit/ML: INTRAMUSCULAR | Qty: 30 | Status: AC

## 2019-11-29 MED FILL — Potassium Chloride Inj 2 mEq/ML: INTRAVENOUS | Qty: 40 | Status: AC

## 2019-11-29 MED FILL — Magnesium Sulfate Inj 50%: INTRAMUSCULAR | Qty: 10 | Status: AC

## 2019-11-29 MED FILL — Mannitol IV Soln 20%: INTRAVENOUS | Qty: 500 | Status: AC

## 2019-11-29 MED FILL — Heparin Sodium (Porcine) Inj 1000 Unit/ML: INTRAMUSCULAR | Qty: 10 | Status: AC

## 2019-11-29 MED FILL — Lidocaine HCl Local Soln Prefilled Syringe 100 MG/5ML (2%): INTRAMUSCULAR | Qty: 5 | Status: AC

## 2019-11-29 MED FILL — Electrolyte-R (PH 7.4) Solution: INTRAVENOUS | Qty: 4000 | Status: AC

## 2019-11-29 NOTE — Progress Notes (Signed)
Pt walked full lap in halls this AM- room air. Tolerated well. O2 sats 94%. Assisted into chair, call bell within reach. Will continue to monitor.

## 2019-11-29 NOTE — Progress Notes (Signed)
Patient ambulated in hallway with nursing staff. Will monitor patient. Darelle Kings, Bettina Gavia rN

## 2019-11-29 NOTE — Progress Notes (Signed)
CARDIAC REHAB PHASE I   PRE:  Rate/Rhythm: 82 SR  BP:  Sitting: 144/64      SaO2: 95 RA  MODE:  Ambulation: 450 ft   POST:  Rate/Rhythm: 93 SR  BP:  Sitting: 198/65 --> 157/59    SaO2: 93 RA  Pt ambulated 422ft in hallway independently with front wheel walker with fast pace. Encouraged pt to slow his pace down so he would get less SOB. Pt educated on importance of being able to walk and talk without getting so out of breath he needs to stop. Encouraged continued IS use. Will continue to follow. Pt still connected to external pacer.  [place], RN BSN 11/29/2019 2:24 PM

## 2019-11-29 NOTE — Progress Notes (Addendum)
      [address]       [place] [address]             [phone]      5 Days Post-Op Procedure(s) (LRB): AORTIC VALVE REPLACEMENT (AVR) using INSPIRIS Resilia 23 MM Bioprosthetic Aortic Valve. (N/A) CORONARY ARTERY BYPASS GRAFTING (CABG) using LIMA to LAD. (N/A) TRANSESOPHAGEAL ECHOCARDIOGRAM (TEE) (N/A)   Subjective:  Patient doing better.  He is ambulating laps around the unit.  He has moved his bowels.  Objective: Vital signs in last 24 hours: Temp:  [98.2 F (36.8 C)-99.5 F (37.5 C)] 99 F (37.2 C) (12/08 0614) Pulse Rate:  [67-89] 89 (12/08 0614) Cardiac Rhythm: Normal sinus rhythm;Bundle branch block (12/07 2006) Resp:  [14-24] 19 (12/08 0614) BP: (110-171)/(57-93) 171/80 (12/08 0614) SpO2:  [91 %-97 %] 94 % (12/08 0614) Weight:  [68.4 kg] 68.4 kg (12/08 0250)  Intake/Output from previous day: 12/07 0701 - 12/08 0700 In: 822 [P.O.:822] Out: 925 [Urine:925]  General appearance: alert, cooperative and no distress Heart: regular rate and rhythm Lungs: diminished breath sounds bibasilar Abdomen: soft, non-tender; bowel sounds normal; no masses,  no organomegaly Extremities: edema trace Wound: clean and dry  Lab Results: Recent Labs    11/27/19 0500 11/28/19 0404  WBC 10.2 6.6  HGB 10.2* 9.2*  HCT 30.3* 27.7*  PLT 110* 123*   BMET:  Recent Labs    11/27/19 0500 11/28/19 0404  NA 135 139  K 4.6 4.2  CL 101 101  CO2 26 29  GLUCOSE 103* 94  BUN 16 12  CREATININE 0.79 0.73  CALCIUM 8.3* 8.1*    PT/INR: No results for input(s): LABPROT, INR in the last 72 hours. ABG    Component Value Date/Time   PHART 7.349 (L) 11/24/2019 1845   HCO3 24.2 11/24/2019 1845   TCO2 26 11/24/2019 1845   ACIDBASEDEF 1.0 11/24/2019 1845   O2SAT 99.0 11/24/2019 1845   CBG (last 3)  Recent Labs    11/27/19 1608 11/27/19 2113 11/28/19 0635  GLUCAP 109* 133* 92    Assessment/Plan: S/P Procedure(s) (LRB): AORTIC VALVE REPLACEMENT (AVR) using  INSPIRIS Resilia 23 MM Bioprosthetic Aortic Valve. (N/A) CORONARY ARTERY BYPASS GRAFTING (CABG) using LIMA to LAD. (N/A) TRANSESOPHAGEAL ECHOCARDIOGRAM (TEE) (N/A)  1. CV- NSR with RBBB- will need ZIO at discharge, no BB.. he remains hypertensive will increase Lisinopril 2. Pulm- no acute issues, continue IS for atelectasis, inhaler, prn nebs for ashtma 3. Renal-creatinine WNL, weight is trending down, continue Lasix, supplement K 4. Expected post operative blood loss anemia, mild Hgb at 9.2 5. Dispo- patient stable, maintaining NSR,  No BB with previous CHB, Zio being arranged for discharge, will discuss removal of pacing wires with Dr. Cyndia Bent, continue current care   LOS: 5 days   Ellwood Handler, PA-C  11/29/2019   Chart reviewed, patient examined, agree with above. Rhythm has been stable sinus. Will remove pacing wires in am and let him go home early afternoon. Will have ZIO patch placed tomorrow before discharge. His wt is still 4 lbs over preop. Continue lasix and KCL for another week.

## 2019-11-29 NOTE — Progress Notes (Signed)
CARDIAC REHAB PHASE I   Offered to walk with pt. Pt states he has walked twice already this morning. Pt agreeable to walk again, but encouraged pt not spread his walks out. Will return after lunch to walk with pt.   [id number] Rufina Falco, RN BSN 11/29/2019 10:44 AM

## 2019-11-29 NOTE — Care Management Important Message (Signed)
Important Message  Patient Details  Name: Gregory [name] MRN: [id number] Date of Birth: 06-Mar-1951   Medicare Important Message Given:  Yes     Shelda Altes 11/29/2019, 12:58 PM

## 2019-11-29 NOTE — Progress Notes (Signed)
Patient ambulated in hallway with nursing staff and rolling walker back in room to chair will monitor patient.Gregory [name], Bettina Gavia RN

## 2019-11-30 ENCOUNTER — Inpatient Hospital Stay (INDEPENDENT_AMBULATORY_CARE_PROVIDER_SITE_OTHER): Payer: PPO

## 2019-11-30 ENCOUNTER — Other Ambulatory Visit: Payer: Self-pay | Admitting: Physician Assistant

## 2019-11-30 DIAGNOSIS — I442 Atrioventricular block, complete: Secondary | ICD-10-CM | POA: Diagnosis not present

## 2019-11-30 MED ORDER — ACETAMINOPHEN 325 MG PO TABS: 650.0000 mg | ORAL_TABLET | Freq: Four times a day (QID) | ORAL | Status: DC | PRN

## 2019-11-30 MED ORDER — TRAMADOL HCL 50 MG PO TABS: 50.0000 mg | ORAL_TABLET | ORAL | 0 refills | Status: DC | PRN

## 2019-11-30 MED ORDER — ASPIRIN 325 MG PO TBEC: 325.0000 mg | DELAYED_RELEASE_TABLET | Freq: Every day | ORAL | 0 refills | Status: DC

## 2019-11-30 NOTE — Progress Notes (Signed)
Discharge instructions reviewed with patient and wife. Medication, appointments, and instructions reviewed. All questions and concerns addressed. IV out. Off monitor CCMD notified.  Paulene Floor, RN

## 2019-11-30 NOTE — Progress Notes (Addendum)
      [address]       [place] [address]             [phone]      6 Days Post-Op Procedure(s) (LRB): AORTIC VALVE REPLACEMENT (AVR) using INSPIRIS Resilia 23 MM Bioprosthetic Aortic Valve. (N/A) CORONARY ARTERY BYPASS GRAFTING (CABG) using LIMA to LAD. (N/A) TRANSESOPHAGEAL ECHOCARDIOGRAM (TEE) (N/A)   Subjective:  Feels pretty good.  States he coughed a lot overnight, which caused him to have increase discomfort.  He was able to get relief with use of Tramadol.  He continues to ambulate independently in the hallways.  + BM  Objective: Vital signs in last 24 hours: Temp:  [98.6 F (37 C)-100 F (37.8 C)] 98.9 F (37.2 C) (12/09 0420) Pulse Rate:  [74-113] 74 (12/09 0009) Cardiac Rhythm: Normal sinus rhythm;Bundle branch block (12/08 1900) Resp:  [13-22] 16 (12/09 0420) BP: (116-171)/(57-73) 159/67 (12/09 0420) SpO2:  [94 %-97 %] 94 % (12/09 0420) Weight:  [68.2 kg] 68.2 kg (12/09 0640)  Intake/Output from previous day: 12/08 0701 - 12/09 0700 In: 360 [P.O.:360] Out: -   General appearance: alert, cooperative and no distress Heart: regular rate and rhythm Lungs: coarse clears with cough Abdomen: soft, non-tender; bowel sounds normal; no masses,  no organomegaly Extremities: edema trace Wound: clean and dry  Lab Results: Recent Labs    11/28/19 0404  WBC 6.6  HGB 9.2*  HCT 27.7*  PLT 123*   BMET:  Recent Labs    11/28/19 0404  NA 139  K 4.2  CL 101  CO2 29  GLUCOSE 94  BUN 12  CREATININE 0.73  CALCIUM 8.1*    PT/INR: No results for input(s): LABPROT, INR in the last 72 hours. ABG    Component Value Date/Time   PHART 7.349 (L) 11/24/2019 1845   HCO3 24.2 11/24/2019 1845   TCO2 26 11/24/2019 1845   ACIDBASEDEF 1.0 11/24/2019 1845   O2SAT 99.0 11/24/2019 1845   CBG (last 3)  Recent Labs    11/27/19 1608 11/27/19 2113 11/28/19 0635  GLUCAP 109* 133* 92    Assessment/Plan: S/P Procedure(s) (LRB): AORTIC VALVE  REPLACEMENT (AVR) using INSPIRIS Resilia 23 MM Bioprosthetic Aortic Valve. (N/A) CORONARY ARTERY BYPASS GRAFTING (CABG) using LIMA to LAD. (N/A) TRANSESOPHAGEAL ECHOCARDIOGRAM (TEE) (N/A)  1. CV- NSR with complete RBBB, HTN improved- continue Norvasc, Lisinopril 2. Pulm- cough as expected, good use of IS 3. Renal- creatinine has been stable, weight is trending down, will stop lasix and he will resume home HCTZ at discharge 4. Dispo- patient stable, in baseline preoperative rhythm of NSR with complete RBBB, will d/c EPW today, ZIO will be placed today as well, will observe and if no arrhythmia arises he will be discharged home later today.   LOS: 6 days   Ellwood Handler, PA-C 11/30/2019    Chart reviewed, patient examined, agree with above. He looks good. Rhythm remains sinus with no further high grade heart block. Will remove pacing wires this am and place ZIO patch. Plan home 4 hrs after pacing wires removed.

## 2019-11-30 NOTE — Progress Notes (Addendum)
1122-1200 Education completed with pt and wife who voiced understanding. Stressed sternal precautions and staying in the tube and using IS. Reviewed heart healthy food choices and gave diet. Reviewed walking instructions. Pt has walker at home if needed and wife stated she will be there with him also. Discussed CRP 2 and referred to [facility].  Did not refer for virtual APP as wife stated pt not proficient with APPs. Discussed smoking cessation and handout given.  Graylon Good RN BSN 11/30/2019 11:57 AM

## 2019-12-01 DIAGNOSIS — I442 Atrioventricular block, complete: Secondary | ICD-10-CM | POA: Diagnosis not present

## 2019-12-02 ENCOUNTER — Telehealth: Payer: Self-pay

## 2019-12-02 NOTE — Telephone Encounter (Signed)
Pt called office to ask several questions regarding medications and post-op appointments. He asks if it is okay for him to take an OTC stool softener d/t constipation and guaifenesin (not DM) d/t congestion. Advised that this is acceptable. Pt also asking if it is okay to alternate/mix Tylenol and Tramadol for pain control. Assured him that it is okay to take Tramadol after taking Tylenol if the Tylenol doesn't alleviate his pain. Reviewed and clarified patient's post-op appointments per his request...to f/u for suture removal on 12/18 and to see Dr. Cyndia Bent (after having CXR) on 12/28/19.

## 2019-12-07 NOTE — Progress Notes (Signed)
Cardiology Office Note   Date:  12/08/2019   ID:  Gregory [name], Upadhyaya Feb 23, 1951, MRN [id number]  PCP:  [name], MD  Cardiologist:   Aldean Suddeth [name], MD   Chief Complaint  Patient presents with  . Coronary Artery Disease  . Aortic Stenosis      History of Present Illness: Gregory [name] is a 68 y.o. male who is seen for follow up post hospitalization for AVR and CABG. He was last seen by me in October 2015. He has a known history of coronary disease. This is based on a prior CT of the chest in 2014 showing extensive coronary calcification. He had a Myoview study in October 2014 that showed a small area of apical ischema. This was unchanged from 2012 and the patient was asymptomatic so he was treated medially. He also has moderate aortic stenosis.  He has a history of ongoing tobacco abuse, HTN, and hyperlipidemia. More recently he had an Echo done showing his aortic stenosis had progressed and was now in the severe range. Mean gradient 50 mm Hg and valve area 0.84 cm2. Peak velocity 4.7 m/sec. This had progressed from last Echo in 2017. He subsequently underwent cardiac cath showing severe AS, single vessel CAD with 99% proximal to mid LAD, normal right heart pressures.   On 11/24/19 he underwent AVR and CABG by Dr Cyndia Bent. This included a LIMA graft to the LAD and AVR with a 23 mm Edwards INSPIRIS RESILIA pericardial valve. His early post op course was complicated by complete heart block. This resolved. He was seen by EP who recommended monitoring. All AV nodal blocking drugs were held. He was started on amlodipine and lisinopril for HTN. A Zio patch monitor was recommended at DC.   On follow up today he is doing OK. Not walking much but doing his breathing exercises. Cough is improving with Mucinex. Feels muscular pain and spasm at the superior right scapular border radiating to his neck and shoulder. He has contacted "Smoke enders" to try and quit smoking. I have okayed him to  use nicotine replacement products. No fever.    Past Medical History:  Diagnosis Date  . Alcohol abuse   . Allergy   . Aortic stenosis   . Arthritis   . Asthma   . Bipolar disorder ([place])   . CAD (coronary artery disease)    coronary calcifications 08/2013 CTA  . Carotid artery occlusion   . Chronic back pain   . Claudication ([place])   . Depression   . Family history of skin cancer   . Heart murmur   . Hepatitis    h/o 1970,doesn't remember which type,1990 epstain-barr  . Hyperlipidemia   . Hypertension   . Neuromuscular disorder ([place])   . Obsessive compulsive disorder   . PAD (peripheral artery disease) ([place])   . Peripheral neuropathy   . Pneumonia   . RBBB   . 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 Severe aortic stenosis   . Sleep apnea    does not wear CPAP  . Smoker   . Spinal stenosis at L4-L5 level   . Tobacco abuse   . Tobacco use disorder     Past Surgical History:  Procedure Laterality Date  . ANAL FISTULECTOMY  09/25/11  . AORTIC VALVE REPLACEMENT N/A 11/24/2019   Procedure: AORTIC VALVE REPLACEMENT (AVR) using INSPIRIS Resilia 23 MM Bioprosthetic Aortic Valve.;  Surgeon: Gaye Pollack, MD;  Location:  [facility];  Service: Open Heart Surgery;  Laterality: N/A;  . COLONOSCOPY  2009  . CORONARY ARTERY BYPASS GRAFT N/A 11/24/2019   Procedure: CORONARY ARTERY BYPASS GRAFTING (CABG) using LIMA to LAD.;  Surgeon: Gaye Pollack, MD;  Location: [facility];  Service: Open Heart Surgery;  Laterality: N/A;  . ELBOW SURGERY     bilaterally for cubital tunnel  . HERNIA REPAIR     with mesh  . MAXIMUM ACCESS (MAS)POSTERIOR LUMBAR INTERBODY FUSION (PLIF) 1 LEVEL N/A 10/25/2014   Procedure: LUMBAR FOUR TO FIVE MAXIMUM ACCESS (MAS) POSTERIOR LUMBAR INTERBODY FUSION (PLIF) 1 LEVEL;  Surgeon: Eustace Moore, MD;  Location: [facility];  Service: Neurosurgery;  Laterality: N/A;  . RIGHT/LEFT HEART CATH AND CORONARY ANGIOGRAPHY N/A 11/10/2019    Procedure: RIGHT/LEFT HEART CATH AND CORONARY ANGIOGRAPHY;  Surgeon: [name], Cleburne Savini M, MD;  Location: [facility];  Service: Cardiovascular;  Laterality: N/A;  . 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 TEE WITHOUT CARDIOVERSION N/A 11/24/2019   Procedure: TRANSESOPHAGEAL ECHOCARDIOGRAM (TEE);  Surgeon: Gaye Pollack, MD;  Location: [facility];  Service: Open Heart Surgery;  Laterality: N/A;  . TONSILLECTOMY  age 61  . VASECTOMY     X 2  . VASECTOMY REVERSAL       Current Outpatient Medications  Medication Sig Dispense Refill  . acetaminophen (TYLENOL) 325 MG tablet Take 2 tablets (650 mg total) by mouth every 6 (six) hours as needed for mild pain.    [name] albuterol (PROVENTIL HFA;VENTOLIN HFA) 108 (90 Base) MCG/ACT inhaler Inhale 2 puffs into the lungs every 6 (six) hours as needed for wheezing. 3 Inhaler 1  . amLODipine (NORVASC) 10 MG tablet TAKE ONE TABLET BY MOUTH DAILY 90 tablet 1  . amoxicillin (AMOXIL) 500 MG capsule Take four capsules one hour before dental appointment. 4 capsule 1  . aspirin EC 325 MG EC tablet Take 1 tablet (325 mg total) by mouth daily. 30 tablet 0  . atorvastatin (LIPITOR) 80 MG tablet Take 1 tablet (80 mg total) by mouth daily. 90 tablet 3  . divalproex (DEPAKOTE) 500 MG DR tablet Take 500 mg by mouth at bedtime. 2-3 tabs    . finasteride (PROSCAR) 5 MG tablet TAKE ONE TABLET BY MOUTH DAILY 90 tablet 2  . fluticasone (FLONASE) 50 MCG/ACT nasal spray SPRAY TWO SPRAYS IN THE AFFECTED NOSTRIL DAILY 16 g 11  . Fluticasone-Salmeterol (ADVAIR DISKUS) 500-50 MCG/DOSE AEPB Inhale 1 puff into the lungs every 12 (twelve) hours. 180 each 3  . lamoTRIgine (LAMICTAL) 200 MG tablet Take 1 tablet (200 mg total) by mouth every morning. 90 tablet 3  . lisinopril-hydrochlorothiazide (ZESTORETIC) 20-12.5 MG tablet TAKE ONE TABLET BY MOUTH EVERY MORNING 90 tablet 2  . Multiple Vitamins-Minerals (MULTIVITAMIN ADULTS 50+  PO) Take 1 tablet by mouth daily.     . traMADol (ULTRAM) 50 MG tablet Take 1 tablet (50 mg total) by mouth every 4 (four) hours as needed for moderate pain. 30 tablet 0   Current Facility-Administered Medications  Medication Dose Route Frequency Provider Last Rate Last Admin  . sodium chloride flush (NS) 0.9 % injection 3 mL  3 mL Intravenous Q12H [name], Terese Heier M, MD        Allergies:   Codeine    Social History:  The patient  reports that he has been smoking cigarettes. He has a 25.00 pack-year smoking history. He has never used smokeless  tobacco. He reports current alcohol use of about 78.0 standard drinks of alcohol per week. He reports that he does not use drugs.   Family History:  The patient's family history includes Cancer in his father, maternal aunt, and maternal grandmother; Diabetes in his brother; Drug abuse in his brother and father; Heart disease in his brother; Heart failure in his mother; Hypertension in his brother; Stroke (age of onset: 40) in his father.    ROS:  Please see the history of present illness.   Otherwise, review of systems are positive for none.   All other systems are reviewed and negative.    PHYSICAL EXAM: VS:  BP (!) 141/70   Pulse 62   Ht 5' 5.5" (1.664 m)   Wt 144 lb (65.3 kg)   SpO2 98%   BMI 23.60 kg/m  , BMI Body mass index is 23.6 kg/m. GEN: Well nourished, well developed, in no acute distress  HEENT: normal  Neck: no JVD, carotid bruits, or masses Cardiac: RRR; no murmur.  No  gallops,no edema. Sternal incision is healing well. Sutures in place where MTs were.  Respiratory:  clear to auscultation bilaterally, normal work of breathing GI: soft, nontender, nondistended, + BS MS: no deformity or atrophy. Femoral pulses are 1+ pedal pulses are nonpalpable bilaterally. Skin: warm and dry, no rash Neuro:  Strength and sensation are intact Psych: euthymic mood, full affect   EKG:  EKG is ordered today. The ekg ordered today demonstrates  NSR rate 62. RBBB. I have personally reviewed and interpreted this study.    Recent Labs: 11/22/2019: ALT 38 11/25/2019: Magnesium 2.7 11/28/2019: BUN 12; Creatinine, Ser 0.73; Hemoglobin 9.2; Platelets 123; Potassium 4.2; Sodium 139    Lipid Panel    Component Value Date/Time   CHOL 138 09/29/2019 1459   TRIG 144 09/29/2019 1459   HDL 36 (L) 09/29/2019 1459   CHOLHDL 3.8 09/29/2019 1459   CHOLHDL 5.1 (H) 06/29/2017 1534   VLDL 45 (H) 06/29/2017 1534   LDLCALC 77 09/29/2019 1459      Wt Readings from Last 3 Encounters:  12/08/19 144 lb (65.3 kg)  11/30/19 150 lb 4.8 oz (68.2 kg)  11/22/19 146 lb 2 oz (66.3 kg)      Other studies Reviewed: Additional studies/ records that were reviewed today include:  Cardiology Nuclear Med Study  Antuan Schwed is a 68 y.o. male MRN : [id number] DOB: December 17, 1951  Procedure Date: 09/29/2013  Nuclear Med Background  Indication for Stress Test: Evaluation for Ischemia and Post Hospital-9/14 Pre-syncope, Increased enzymes  History: 21014 Echo EF 55-60%, mod AS, 2012 MPS Abnormal, EF 81%, CT- Coronary Calcifications  Cardiac Risk Factors: Carotid Disease, Family History - CAD, Hypertension, Lipids, RBBB and Smoker  Symptoms: Dizziness, Light-Headedness and Near Syncope  Nuclear Pre-Procedure  Caffeine/Decaff Intake: None  NPO After: 9:00pm   O2 Sat: 97% on room air.  IV 0.9% NS with Angio Cath: 22g   IV Site: R Antecubital  IV Started by: Crissie Figures, RN   Chest Size (in): 42  Cup Size: n/a   Height: 5\' 5"  (1.651 m)  Weight: 134 lb (60.782 kg)   BMI: Body mass index is 22.3 kg/(m^2).  Tech Comments: N/A   Nuclear Med Study  1 or 2 day study: 1 day  Stress Test Type: Carlton Adam   Reading MD: Mertie Moores, MD  Order Authorizing Provider: Dontravious Camille [name], MD   Resting Radionuclide: Technetium 55m Sestamibi  Resting Radionuclide Dose: 11.0 mCi   Stress Radionuclide:  Technetium 71m Sestamibi  Stress Radionuclide Dose: 33.0 mCi   Stress Protocol   Rest HR: 56  Stress HR: 90   Rest BP: 151/73  Stress BP: 156/69   Exercise Time (min): n/a  METS: n/a   Predicted Max HR: 157 bpm  % Max HR: 57.32 bpm  Rate Pressure Product: 14040  Dose of Adenosine (mg): n/a  Dose of Lexiscan: 0.4 mg   Dose of Atropine (mg): n/a  Dose of Dobutamine: n/a mcg/kg/min (at max HR)   Stress Test Technologist: Glade Lloyd, BS-ES  Nuclear Technologist: Annye Rusk, CNMT   Rest Procedure: Myocardial perfusion imaging was performed at rest 45 minutes following the intravenous administration of Technetium 90m Sestamibi.  Rest ECG: NSR-RBBB  Stress Procedure: The patient received IV Lexiscan 0.4 mg over 15-seconds. Patient experienced stomach "hurting" that resolved in recovery. Technetium 9m Sestamibi injected at 30-seconds. Quantitative spect images were obtained after a 45 minute delay.  Stress ECG: No significant change from baseline ECG  QPS  Raw Data Images: Normal; no motion artifact; normal heart/lung ratio.  Stress Images: there is a small- medium sized area of moderately severe attenuation at the apex with normal uptake in the other regions.  Rest Images: Normal homogeneous uptake in all areas of the myocardium.  Subtraction (SDS): There is a small area of reversible ischemia in the apex.  Transient Ischemic Dilatation (Normal <1.22): N/A  Lung/Heart Ratio (Normal <0.45): 0.35  Quantitative Gated Spect Images  QGS EDV: 91 ml  QGS ESV: 21 ml  Impression  Exercise Capacity: Lexiscan with no exercise.  BP Response: Normal blood pressure response.  Clinical Symptoms: No significant symptoms noted.  ECG Impression: No significant ST segment change suggestive of ischemia.  Comparison with Prior Nuclear Study: 2012  Overall Impression: Intermediate risk stress nuclear study . There is a small- medium sized area of ischemia at the apex. .  LV Ejection Fraction: 77%. LV Wall Motion: NL LV Function; NL Wall Motion.  Thayer Headings, Brooke Bonito., MD, [facility]  Echo:  08/29/14:Study Conclusions  - Left ventricle: The cavity size was normal. Systolic function was normal. The estimated ejection fraction was in the range of 55% to 60%. Wall motion was normal; there were no regional wall motion abnormalities. Features are consistent with a pseudonormal left ventricular filling pattern, with concomitant abnormal relaxation and increased filling pressure (grade 2 diastolic dysfunction). - Aortic valve: Moderate thickening and calcification. There was moderate stenosis. Valve area: 1.13cm^2(VTI). Valve area: 1.12cm^2 (Vmax).  Echo 10/06/19: IMPRESSIONS    1. Left ventricular ejection fraction, by visual estimation, is 55 to 60%. The left ventricle has normal function. Normal left ventricular size. Left ventricular septal wall thickness was moderately increased. There is moderately increased left  ventricular hypertrophy.  2. Global right ventricle has normal systolic function.The right ventricular size is normal. No increase in right ventricular wall thickness.  3. Left atrial size was normal.  4. Right atrial size was normal.  5. Mild mitral annular calcification.  6. Moderate calcification of the mitral valve leaflet(s).  7. Moderate thickening of the mitral valve leaflet(s).  8. The mitral valve is normal in structure. Trace mitral valve regurgitation.  9. The tricuspid valve is normal in structure. Tricuspid valve regurgitation is mild. 10. The aortic valve has an indeterminant number of cusps Aortic valve regurgitation is mild by color flow Doppler. Severe aortic valve stenosis. 11. There is Severe calcifcation of the aortic valve. 12. There is Severely thickening of the aortic valve. 13. The  pulmonic valve was grossly normal. Pulmonic valve regurgitation is mild by color flow Doppler  Cardiac cath 11/10/19:  RIGHT/LEFT HEART CATH AND CORONARY ANGIOGRAPHY  Conclusion    Prox LAD to Mid LAD lesion is 99% stenosed.  Mid Cx to Dist Cx lesion  is 50% stenosed.  Mid RCA lesion is 30% stenosed.  The left ventricular systolic function is normal.  LV end diastolic pressure is normal.  The left ventricular ejection fraction is 55-65% by visual estimate.  There is severe aortic valve stenosis.   1. Critical single vessel obstructive CAD with 99% mid LAD - heavily calcified 2. Severe aortic stenosis. Mean gradient 47 mm Hg. AVA 0.68 cm squared with index 0.39. 3. Normal LV filling pressures 4. Normal Right heart pressures 5. Normal cardiac output.  Plan: referral to CT surgery for combined      ASSESSMENT AND PLAN:  1. Coronary disease. With severe LAD stenosis. S/p CABG with LIMA to the LAD.   Recommend continued aspirin, amlodipine, and statin therapy. On ASA 325 mg for 3 months then reduce to 81 mg daily. Goal LDL <70.   2. Hypertension-control is OK.   3. Severe  aortic stenosis.  Progressive by Echo. S/p tissue AVR. No significant murmur on exam. Progressing well post op. Recommend follow up Echo. Instructed on SBE prophylaxis.  4. PAD with chronic claudication. Dopplers reveal severe aortoiliac disease and more distal disease. Will need referral to [name] once healed from heart surgery. Will follow up with me in 2 months.   5. Tobacco abuse. Have strongly encouraged smoking cessation. Encourage him to follow thru with smoking cessation program  6. Hyperlipidemia. High risk patient. LDL goal < 70. Now on high dose lipitor. Repeat labs in 2 months.  7. Right bundle branch block with -chronic  8. Bipolar disorder.  9. Remote history of Etoh abuse- sober for years.   10. Complete heart block post op. Resolved. Avoid AV nodal blocking agents. On Zio patch monitor now.   Current medicines are reviewed at length with the patient today.  The patient does not have concerns regarding medicines.  The following changes have been made:  See above  Labs/ tests ordered today include:   Orders Placed This  Encounter  Procedures  . EKG 12-Lead  . ECHOCARDIOGRAM COMPLETE     Disposition:   FU 2 months   Signed, Kiesha Ensey [name], MD  12/08/2019 9:21 AM    [facility] [address] [place], [place], [address] Phone [phone], Fax [phone]

## 2019-12-08 ENCOUNTER — Encounter: Payer: Self-pay | Admitting: Cardiology

## 2019-12-08 ENCOUNTER — Other Ambulatory Visit: Payer: Self-pay

## 2019-12-08 ENCOUNTER — Ambulatory Visit: Payer: PPO | Admitting: Cardiology

## 2019-12-08 VITALS — BP 141/70 | HR 62 | Ht 65.5 in | Wt 144.0 lb

## 2019-12-08 DIAGNOSIS — Z952 Presence of prosthetic heart valve: Secondary | ICD-10-CM | POA: Diagnosis not present

## 2019-12-08 DIAGNOSIS — E786 Lipoprotein deficiency: Secondary | ICD-10-CM

## 2019-12-08 DIAGNOSIS — I442 Atrioventricular block, complete: Secondary | ICD-10-CM | POA: Diagnosis not present

## 2019-12-08 DIAGNOSIS — I739 Peripheral vascular disease, unspecified: Secondary | ICD-10-CM

## 2019-12-08 DIAGNOSIS — I350 Nonrheumatic aortic (valve) stenosis: Secondary | ICD-10-CM | POA: Diagnosis not present

## 2019-12-08 DIAGNOSIS — Z720 Tobacco use: Secondary | ICD-10-CM

## 2019-12-08 NOTE — Patient Instructions (Addendum)
I encourage your efforts at smoking cessation  You may use nicotine replacement therapy  You need to walk more.   We will schedule you for a follow up Echo.  I will see you in 2 months

## 2019-12-08 NOTE — Addendum Note (Signed)
Addended by: Kathyrn Lass on: 12/08/2019 09:26 AM   Modules accepted: Orders

## 2019-12-09 ENCOUNTER — Encounter (INDEPENDENT_AMBULATORY_CARE_PROVIDER_SITE_OTHER): Payer: Self-pay

## 2019-12-09 DIAGNOSIS — Z4802 Encounter for removal of sutures: Secondary | ICD-10-CM

## 2019-12-12 ENCOUNTER — Encounter: Payer: Self-pay | Admitting: Physician Assistant

## 2019-12-12 ENCOUNTER — Ambulatory Visit (INDEPENDENT_AMBULATORY_CARE_PROVIDER_SITE_OTHER): Payer: Self-pay | Admitting: Physician Assistant

## 2019-12-12 ENCOUNTER — Other Ambulatory Visit: Payer: Self-pay | Admitting: *Deleted

## 2019-12-12 ENCOUNTER — Telehealth: Payer: Self-pay | Admitting: Thoracic Surgery (Cardiothoracic Vascular Surgery)

## 2019-12-12 ENCOUNTER — Other Ambulatory Visit: Payer: Self-pay

## 2019-12-12 ENCOUNTER — Ambulatory Visit
Admission: RE | Admit: 2019-12-12 | Discharge: 2019-12-12 | Disposition: A | Discharge status: Home / Self Care | Payer: PPO | Source: Ambulatory Visit | Attending: Thoracic Surgery (Cardiothoracic Vascular Surgery) | Admitting: Thoracic Surgery (Cardiothoracic Vascular Surgery)

## 2019-12-12 VITALS — BP 121/61 | HR 69 | Temp 97.9°F | Resp 20 | Ht 65.5 in | Wt 138.0 lb

## 2019-12-12 DIAGNOSIS — I350 Nonrheumatic aortic (valve) stenosis: Secondary | ICD-10-CM

## 2019-12-12 DIAGNOSIS — J90 Pleural effusion, not elsewhere classified: Secondary | ICD-10-CM | POA: Diagnosis not present

## 2019-12-12 DIAGNOSIS — R52 Pain, unspecified: Secondary | ICD-10-CM

## 2019-12-12 DIAGNOSIS — I2510 Atherosclerotic heart disease of native coronary artery without angina pectoris: Secondary | ICD-10-CM

## 2019-12-12 DIAGNOSIS — Z951 Presence of aortocoronary bypass graft: Secondary | ICD-10-CM

## 2019-12-12 DIAGNOSIS — Z952 Presence of prosthetic heart valve: Secondary | ICD-10-CM

## 2019-12-12 MED ORDER — TRAMADOL HCL 50 MG PO TABS: 50.0000 mg | ORAL_TABLET | Freq: Four times a day (QID) | ORAL | 0 refills | Status: AC | PRN

## 2019-12-12 NOTE — Progress Notes (Signed)
[address]       [place] [address]             [phone]      Gregory [name] is a 68 y.o. male patient status post AVR and CABG by Dr. Cyndia Bent on 11/24/2019.  Patient presents today due to increased pain under his shoulder blade.  1. S/P AVR (aortic valve replacement)   2. S/P CABG x 1   3. Aortic valve stenosis, etiology of cardiac valve disease unspecified   4. Coronary artery disease involving native heart without angina pectoris, unspecified vessel or lesion type    Past Medical History:  Diagnosis Date  . Alcohol abuse   . Allergy   . Aortic stenosis   . Arthritis   . Asthma   . Bipolar disorder ([place])   . CAD (coronary artery disease)    coronary calcifications 08/2013 CTA  . Carotid artery occlusion   . Chronic back pain   . Claudication ([place])   . Depression   . Family history of skin cancer   . Heart murmur   . Hepatitis    h/o 1970,doesn't remember which type,1990 epstain-barr  . Hyperlipidemia   . Hypertension   . Neuromuscular disorder ([place])   . Obsessive compulsive disorder   . PAD (peripheral artery disease) ([place])   . Peripheral neuropathy   . Pneumonia   . RBBB   . 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 Severe aortic stenosis   . Sleep apnea    does not wear CPAP  . Smoker   . Spinal stenosis at L4-L5 level   . Tobacco abuse   . Tobacco use disorder    No past surgical history pertinent negatives on file. Scheduled Meds: . sodium chloride flush  3 mL Intravenous Q12H   Continuous Infusions: PRN Meds:  Allergies  Allergen Reactions  . Codeine Anaphylaxis   Active Problems:   * No active hospital problems. *  Blood pressure 121/61, pulse 69, temperature 97.9 F (36.6 C), temperature source Skin, resp. rate 20, height 5' 5.5" (1.664 m), weight 62.6 kg, SpO2 97 %.  Subjective the patient's biggest complaint today is right-sided scapular pain.  The pain is made worse by palpation  and abduction of the right arm.  It appears muscular in nature.  Objective  Cor: Regular rate and rhythm, no murmur Pulm: Clear to auscultation in all fields Back: Tenderness noted along the right shoulder blade.  Pain with palpation.  Also some tenderness in the front of the right humeral head.  Pain is also redemonstrated with abduction of the right arm. Abd: No tenderness Wound: Sternotomy incision is healing well. Ext: No edema    CLINICAL DATA:  Right shoulder pain, recent CABG  EXAM: CHEST - 2 VIEW  COMPARISON:  11/28/2019  FINDINGS: Decreased, trace residual pleural effusions. Improved aeration at the lung bases. No new consolidation or edema. No pneumothorax.  Stable cardiomediastinal contours with normal heart size and evidence of CABG. A loop recorder is present.  IMPRESSION: Decreased, now trace pleural effusions with improved aeration at the lung bases.   Electronically Signed   By: Macy Mis M.D.   On: 12/12/2019 13:45   Assessment & Plan   The patient presents today status post coronary bypass grafting x1 and AVR with right-sided scapular pain.  The pain does appear muscular in nature and has been causing the patient to only sleep for  a few hours at a time.  The patient has been rotating Tylenol and tramadol for pain control.  He has been trying to massage the area a few times a day.  He has been sleeping with a heating pad over the area.  I suggested some light stretching and print outs were provided to the patient.  I also suggested icing that particular area which might help with inflammation.  I did refill the patient's tramadol prescription today and provided him with 2 more weeks of as needed medication.  Patient is to wean off the tramadol and hopefully only require Tylenol over the next few weeks.  If the pain increases or spreads he is to see a orthopedic specialist.  We are limited with what we can provide here in our office and he might need  further examination.  I did explain that we tuck the patient's arms during surgery and this could certainly cause discomfort.  The patient is to follow-up at his usual appointment with Dr. Cyndia Bent the first week of January.  Hopefully by this time his scapular pain will have improved.  The patient is encouraged to continue walking several times a day.  He is to ice and/or heat the area as needed.  He is to continue Tylenol and tramadol as needed.  He is to continue a heart healthy diet.  This includes low-fat protein sources which were reviewed with the patient.  Gregory [name] 12/12/2019

## 2019-12-12 NOTE — Telephone Encounter (Signed)
Mr. Kilby called to report worsening pain under his shoulder blade. Has been going on about a week now.  Will call office around 9:30 to set up time to come in for eval.  Remo Lipps C. Roxan Hockey, MD [facility] [phone]

## 2019-12-13 ENCOUNTER — Ambulatory Visit (HOSPITAL_COMMUNITY): Payer: PPO | Attending: Cardiology

## 2019-12-13 DIAGNOSIS — I739 Peripheral vascular disease, unspecified: Secondary | ICD-10-CM

## 2019-12-13 DIAGNOSIS — Z952 Presence of prosthetic heart valve: Secondary | ICD-10-CM | POA: Diagnosis not present

## 2019-12-13 DIAGNOSIS — I350 Nonrheumatic aortic (valve) stenosis: Secondary | ICD-10-CM

## 2019-12-13 DIAGNOSIS — E786 Lipoprotein deficiency: Secondary | ICD-10-CM

## 2019-12-13 DIAGNOSIS — I442 Atrioventricular block, complete: Secondary | ICD-10-CM

## 2019-12-13 DIAGNOSIS — Z720 Tobacco use: Secondary | ICD-10-CM | POA: Diagnosis not present

## 2019-12-14 ENCOUNTER — Encounter: Payer: PPO | Admitting: Surgery

## 2019-12-28 ENCOUNTER — Encounter: Payer: PPO | Admitting: Surgery

## 2020-01-02 ENCOUNTER — Other Ambulatory Visit: Payer: Self-pay | Admitting: Surgery

## 2020-01-02 DIAGNOSIS — Z951 Presence of aortocoronary bypass graft: Secondary | ICD-10-CM

## 2020-01-04 ENCOUNTER — Encounter: Payer: Self-pay | Admitting: Surgery

## 2020-01-04 ENCOUNTER — Other Ambulatory Visit: Payer: Self-pay

## 2020-01-04 ENCOUNTER — Ambulatory Visit (INDEPENDENT_AMBULATORY_CARE_PROVIDER_SITE_OTHER): Payer: Self-pay | Admitting: Surgery

## 2020-01-04 ENCOUNTER — Ambulatory Visit
Admission: RE | Admit: 2020-01-04 | Discharge: 2020-01-04 | Disposition: A | Discharge status: Home / Self Care | Payer: PPO | Source: Ambulatory Visit | Attending: Surgery | Admitting: Surgery

## 2020-01-04 VITALS — BP 127/53 | HR 64 | Temp 97.7°F | Resp 16 | Ht 65.5 in | Wt 136.4 lb

## 2020-01-04 DIAGNOSIS — Z951 Presence of aortocoronary bypass graft: Secondary | ICD-10-CM | POA: Diagnosis not present

## 2020-01-04 DIAGNOSIS — I350 Nonrheumatic aortic (valve) stenosis: Secondary | ICD-10-CM

## 2020-01-04 DIAGNOSIS — Z952 Presence of prosthetic heart valve: Secondary | ICD-10-CM

## 2020-01-04 DIAGNOSIS — I2510 Atherosclerotic heart disease of native coronary artery without angina pectoris: Secondary | ICD-10-CM

## 2020-01-04 NOTE — Progress Notes (Signed)
HPI: Patient returns for routine postoperative follow-up having undergone coronary bypass graft surgery x1 using a left internal mammary graft to the LAD and aortic valve replacement with a 23 mm Edwards pericardial valve on 11/24/2019. The patient's early postoperative recovery while in the hospital was notable for an uncomplicated postop course. Since hospital discharge the patient reports that he has been slowly increasing the amount of time that he is walking and is now up to 10 minutes daily.  He has some mild chest wall soreness.  He has returned to smoking and is smoking about 8 cigarettes/day but trying to quit.  He is using a nicotine patch and nicotine gum every other day.   Current Outpatient Medications  Medication Sig Dispense Refill  . acetaminophen (TYLENOL) 325 MG tablet Take 2 tablets (650 mg total) by mouth every 6 (six) hours as needed for mild pain.    [name] albuterol (PROVENTIL HFA;VENTOLIN HFA) 108 (90 Base) MCG/ACT inhaler Inhale 2 puffs into the lungs every 6 (six) hours as needed for wheezing. 3 Inhaler 1  . amLODipine (NORVASC) 10 MG tablet TAKE ONE TABLET BY MOUTH DAILY 90 tablet 1  . amoxicillin (AMOXIL) 500 MG capsule Take four capsules one hour before dental appointment. 4 capsule 1  . aspirin EC 325 MG EC tablet Take 1 tablet (325 mg total) by mouth daily. 30 tablet 0  . atorvastatin (LIPITOR) 80 MG tablet Take 1 tablet (80 mg total) by mouth daily. 90 tablet 3  . divalproex (DEPAKOTE) 500 MG DR tablet Take 500 mg by mouth at bedtime. 2-3 tabs    . finasteride (PROSCAR) 5 MG tablet TAKE ONE TABLET BY MOUTH DAILY 90 tablet 2  . fluticasone (FLONASE) 50 MCG/ACT nasal spray SPRAY TWO SPRAYS IN THE AFFECTED NOSTRIL DAILY 16 g 11  . Fluticasone-Salmeterol (ADVAIR DISKUS) 500-50 MCG/DOSE AEPB Inhale 1 puff into the lungs every 12 (twelve) hours. 180 each 3  . lamoTRIgine (LAMICTAL) 200 MG tablet Take 1 tablet (200 mg total) by mouth every morning. 90 tablet 3  .  lisinopril-hydrochlorothiazide (ZESTORETIC) 20-12.5 MG tablet TAKE ONE TABLET BY MOUTH EVERY MORNING 90 tablet 2  . Multiple Vitamins-Minerals (MULTIVITAMIN ADULTS 50+ PO) Take 1 tablet by mouth daily.     . traMADol (ULTRAM) 50 MG tablet Take by mouth every 6 (six) hours as needed.     Current Facility-Administered Medications  Medication Dose Route Frequency Provider Last Rate Last Admin  . sodium chloride flush (NS) 0.9 % injection 3 mL  3 mL Intravenous Q12H [name], [name], MD        Physical Exam: BP (!) 127/53 (BP Location: Right Arm, Patient Position: Sitting, Cuff Size: Normal)   Pulse 64   Temp 97.7 F (36.5 C)   Resp 16   Ht 5' 5.5" (1.664 m)   Wt 136 lb 6.4 oz (61.9 kg)   SpO2 95% Comment: RA  BMI 22.35 kg/m  He looks well. Cardiac exam shows a regular rate and rhythm with normal heart sounds.  There is no murmur. Lungs are clear. The chest incision is healing well and the sternum is stable. There is no peripheral edema.  Diagnostic Tests:  CLINICAL DATA:  Status post coronary bypass graft.  EXAM: CHEST - 2 VIEW  COMPARISON:  December 12, 2019.  FINDINGS: The heart size and mediastinal contours are within normal limits. Status post aortic valve repair. No pneumothorax or pleural effusion is noted. Both lungs are clear. The visualized skeletal structures  are unremarkable.  IMPRESSION: No active cardiopulmonary disease.   Electronically Signed   By: Marijo Conception M.D.   On: 01/04/2020 10:42   Impression:  Overall I think he is making a good recovery following his surgery.  I encouraged him to continue increasing the distance that he is walking.  I think he could begin cardiac rehab.  He said that cardiac rehab but already contacted and wants and he will reach out to them to see when he can start.  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  I strongly  encouraged him to quit smoking.  Plan:  He will continue to follow-up with his PCP and Dr. [name] and will return to see me if he has any problems with his incision.   Gaye Pollack, MD [facility] [phone]

## 2020-01-06 ENCOUNTER — Telehealth (HOSPITAL_COMMUNITY): Payer: Self-pay | Admitting: Pharmacist

## 2020-01-08 NOTE — Telephone Encounter (Signed)
Cardiac Rehab - Pharmacy Resident Documentation   Patient unable to be reached after three call attempts. Please complete allergy verification and medication review during patient's cardiac rehab appointment.     

## 2020-01-09 ENCOUNTER — Telehealth (HOSPITAL_COMMUNITY): Payer: Self-pay | Admitting: *Deleted

## 2020-01-09 ENCOUNTER — Encounter (HOSPITAL_COMMUNITY)
Admission: RE | Admit: 2020-01-09 | Discharge: 2020-01-09 | Disposition: A | Discharge status: Home / Self Care | Payer: PPO | Source: Ambulatory Visit | Attending: Cardiology | Admitting: Cardiology

## 2020-01-09 ENCOUNTER — Other Ambulatory Visit: Payer: Self-pay

## 2020-01-09 DIAGNOSIS — Z951 Presence of aortocoronary bypass graft: Secondary | ICD-10-CM | POA: Insufficient documentation

## 2020-01-09 DIAGNOSIS — Z953 Presence of xenogenic heart valve: Secondary | ICD-10-CM | POA: Insufficient documentation

## 2020-01-09 NOTE — Telephone Encounter (Signed)
Spoke with the patient completed health history, medication review interested in virtual cardiac rehab. Patient will bring smart phone tomorrow. Patient confirmed orientation appointment for tomorrow.[name], RN,BSN 01/09/2020 10:17 AM

## 2020-01-10 ENCOUNTER — Other Ambulatory Visit: Payer: Self-pay

## 2020-01-10 ENCOUNTER — Encounter (HOSPITAL_COMMUNITY)
Admission: RE | Admit: 2020-01-10 | Discharge: 2020-01-10 | Disposition: A | Discharge status: Home / Self Care | Payer: PPO | Source: Ambulatory Visit | Attending: Cardiology | Admitting: Cardiology

## 2020-01-10 ENCOUNTER — Encounter (HOSPITAL_COMMUNITY): Payer: Self-pay

## 2020-01-10 VITALS — BP 114/50 | HR 71 | Temp 98.6°F | Resp 18 | Ht 64.75 in | Wt 138.2 lb

## 2020-01-10 DIAGNOSIS — Z951 Presence of aortocoronary bypass graft: Secondary | ICD-10-CM

## 2020-01-10 DIAGNOSIS — Z953 Presence of xenogenic heart valve: Secondary | ICD-10-CM

## 2020-01-10 NOTE — Progress Notes (Signed)
Cardiac Individual Treatment Plan  Patient Details  Name: Gregory [name] MRN: [id number] Date of Birth: [birth year] Referring Provider:     [facility] from 01/10/2020 in [facility]  Referring Provider  [name], Peter, MD      Initial Encounter Date:    CARDIAC REHAB PHASE II ORIENTATION from 01/10/2020 in [facility]  Date  01/10/20      Visit Diagnosis: S/P aortic valve replacement with bioprosthetic valve  S/P CABG x 1  Patient's Home Medications on Admission:  Current Outpatient Medications:  .  acetaminophen (TYLENOL) 325 MG tablet, Take 2 tablets (650 mg total) by mouth every 6 (six) hours as needed for mild pain., Disp:  , Rfl:  .  albuterol (PROVENTIL HFA;VENTOLIN HFA) 108 (90 Base) MCG/ACT inhaler, Inhale 2 puffs into the lungs every 6 (six) hours as needed for wheezing., Disp: 3 Inhaler, Rfl: 1 .  amLODipine (NORVASC) 10 MG tablet, TAKE ONE TABLET BY MOUTH DAILY, Disp: 90 tablet, Rfl: 1 .  amoxicillin (AMOXIL) 500 MG capsule, Take four capsules one hour before dental appointment., Disp: 4 capsule, Rfl: 1 .  aspirin EC 325 MG EC tablet, Take 1 tablet (325 mg total) by mouth daily., Disp: 30 tablet, Rfl: 0 .  atorvastatin (LIPITOR) 80 MG tablet, Take 1 tablet (80 mg total) by mouth daily., Disp: 90 tablet, Rfl: 3 .  divalproex (DEPAKOTE) 500 MG DR tablet, Take 500 mg by mouth at bedtime. 2-3 tabs, Disp: , Rfl:  .  finasteride (PROSCAR) 5 MG tablet, TAKE ONE TABLET BY MOUTH DAILY, Disp: 90 tablet, Rfl: 2 .  fluticasone (FLONASE) 50 MCG/ACT nasal spray, SPRAY TWO SPRAYS IN THE AFFECTED NOSTRIL DAILY, Disp: 16 g, Rfl: 11 .  Fluticasone-Salmeterol (ADVAIR DISKUS) 500-50 MCG/DOSE AEPB, Inhale 1 puff into the lungs every 12 (twelve) hours., Disp: 180 each, Rfl: 3 .  lamoTRIgine (LAMICTAL) 200 MG tablet, Take 1 tablet (200 mg total) by mouth every morning., Disp: 90 tablet, Rfl: 3 .   lisinopril-hydrochlorothiazide (ZESTORETIC) 20-12.5 MG tablet, TAKE ONE TABLET BY MOUTH EVERY MORNING, Disp: 90 tablet, Rfl: 2 .  metoprolol tartrate (LOPRESSOR) 25 MG tablet, , Disp: , Rfl:  .  Multiple Vitamins-Minerals (MULTIVITAMIN ADULTS 50+ PO), Take 1 tablet by mouth daily. , Disp: , Rfl:   Current Facility-Administered Medications:  .  sodium chloride flush (NS) 0.9 % injection 3 mL, 3 mL, Intravenous, Q12H, [name], [name], MD  Past Medical History: Past Medical History:  Diagnosis Date  . Alcohol abuse   . Allergy   . Aortic stenosis   . Arthritis   . Asthma   . Bipolar disorder ([place])   . CAD (coronary artery disease)    coronary calcifications 08/2013 CTA  . Carotid artery occlusion   . Chronic back pain   . Claudication ([place])   . Depression   . Family history of skin cancer   . Heart murmur   . Hepatitis    h/o 1970,doesn't remember which type,1990 epstain-barr  . Hyperlipidemia   . Hypertension   . Neuromuscular disorder ([place])   . Obsessive compulsive disorder   . PAD (peripheral artery disease) ([place])   . Peripheral neuropathy   . Pneumonia   . RBBB   . 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 Severe aortic stenosis   . Sleep apnea    does not wear CPAP  . Smoker   . Spinal  stenosis at L4-L5 level   . Tobacco abuse   . Tobacco use disorder     Tobacco Use: Social History   Tobacco Use  Smoking Status Current Every Day Smoker  . Packs/day: 1.00  . Years: 25.00  . Pack years: 25.00  . Types: Cigarettes  Smokeless Tobacco Never Used  Tobacco Comment   Pt had stopped smoking X10 years/ currently smoking a pack a day     Labs: Recent Review Flowsheet Data    Labs for ITP Cardiac and Pulmonary Rehab Latest Ref Rng & Units 11/24/2019 11/24/2019 11/24/2019 11/24/2019 11/24/2019   Cholestrol 100 - 199 mg/dL - - - - -   LDLCALC 0 - 99 mg/dL - - - - -   HDL >39 mg/dL - - - - -   Trlycerides 0 - 149 mg/dL - - - - -    Hemoglobin A1c 4.8 - 5.6 % - - - - -   PHART 7.350 - 7.450 - - 7.375 7.340(L) 7.349(L)   PCO2ART 32.0 - 48.0 mmHg - - 40.1 44.4 44.0   HCO3 20.0 - 28.0 mmol/L - - 23.7 23.9 24.2   TCO2 22 - 32 mmol/L 28 28 25 25 26    ACIDBASEDEF 0.0 - 2.0 mmol/L - - 2.0 2.0 1.0   O2SAT % - - 99.0 99.0 99.0      Capillary Blood Glucose: Lab Results  Component Value Date   GLUCAP 92 11/28/2019   GLUCAP 133 (H) 11/27/2019   GLUCAP 109 (H) 11/27/2019   GLUCAP 122 (H) 11/27/2019   GLUCAP 91 11/27/2019     Exercise Target Goals: Exercise Program Goal: Individual exercise prescription set using results from initial 6 min walk test and THRR while considering  patient's activity barriers and safety.   Exercise Prescription Goal: Initial exercise prescription builds to 30-45 minutes a day of aerobic activity, 2-3 days per week.  Home exercise guidelines will be given to patient during program as part of exercise prescription that the participant will acknowledge.  Activity Barriers & Risk Stratification: Activity Barriers & Cardiac Risk Stratification - 01/10/20 1229      Activity Barriers & Cardiac Risk Stratification   Activity Barriers  Other (comment)    Comments  leg numbness    Cardiac Risk Stratification  High       6 Minute Walk: 6 Minute Walk    Row Name 01/10/20 0919         6 Minute Walk   Phase  Initial     Distance  1001 feet     Walk Time  6 minutes     # of Rest Breaks  1     MPH  1.9     METS  2.53     RPE  11     Perceived Dyspnea   0     VO2 Peak  8.85     Symptoms  Yes (comment)     Comments  Cramping in gluteus area.     Resting HR  64 bpm     Resting BP  114/50     Resting Oxygen Saturation   99 %     Exercise Oxygen Saturation  during 6 min walk  100 %     Max Ex. HR  83 bpm     Max Ex. BP  142/58     2 Minute Post BP  134/60        Oxygen Initial Assessment:   Oxygen Re-Evaluation:  Oxygen Discharge (Final Oxygen Re-Evaluation):   Initial  Exercise Prescription: Initial Exercise Prescription - 01/10/20 1200      Date of Initial Exercise RX and Referring Provider   Date  01/10/20    Referring Provider  [name], Peter, MD      Track   Minutes  30      Prescription Details   Frequency (times per week)  5-7    Duration  Progress to 30 minutes of continuous aerobic without signs/symptoms of physical distress      Intensity   THRR 40-80% of Max Heartrate  60-121    Ratings of Perceived Exertion  11-13    Perceived Dyspnea  0-4      Progression   Progression  Continue to progress workloads to maintain intensity without signs/symptoms of physical distress.      Resistance Training   Training Prescription  Yes    Reps  10-15       Perform Capillary Blood Glucose checks as needed.  Exercise Prescription Changes:   Exercise Comments:   Exercise Goals and Review:  Exercise Goals    Row Name 01/10/20 1230             Exercise Goals   Increase Physical Activity  Yes       Intervention  Provide advice, education, support and counseling about physical activity/exercise needs.;Develop an individualized exercise prescription for aerobic and resistive training based on initial evaluation findings, risk stratification, comorbidities and participant's personal goals.       Expected Outcomes  Short Term: Attend rehab on a regular basis to increase amount of physical activity.;Long Term: Exercising regularly at least 3-5 days a week.;Long Term: Add in home exercise to make exercise part of routine and to increase amount of physical activity.       Increase Strength and Stamina  Yes       Intervention  Provide advice, education, support and counseling about physical activity/exercise needs.;Develop an individualized exercise prescription for aerobic and resistive training based on initial evaluation findings, risk stratification, comorbidities and participant's personal goals.       Expected Outcomes  Short Term: Increase  workloads from initial exercise prescription for resistance, speed, and METs.;Short Term: Perform resistance training exercises routinely during rehab and add in resistance training at home;Long Term: Improve cardiorespiratory fitness, muscular endurance and strength as measured by increased METs and functional capacity (6MWT)       Able to understand and use rate of perceived exertion (RPE) scale  Yes       Intervention  Provide education and explanation on how to use RPE scale       Expected Outcomes  Short Term: Able to use RPE daily in rehab to express subjective intensity level;Long Term:  Able to use RPE to guide intensity level when exercising independently       Knowledge and understanding of Target Heart Rate Range (THRR)  Yes       Intervention  Provide education and explanation of THRR including how the numbers were predicted and where they are located for reference       Expected Outcomes  Short Term: Able to state/look up THRR;Long Term: Able to use THRR to govern intensity when exercising independently;Short Term: Able to use daily as guideline for intensity in rehab       Able to check pulse independently  Yes       Intervention  Provide education and demonstration on how to check pulse in carotid and  radial arteries.;Review the importance of being able to check your own pulse for safety during independent exercise       Expected Outcomes  Short Term: Able to explain why pulse checking is important during independent exercise;Long Term: Able to check pulse independently and accurately       Understanding of Exercise Prescription  Yes       Intervention  Provide education, explanation, and written materials on patient's individual exercise prescription       Expected Outcomes  Short Term: Able to explain program exercise prescription;Long Term: Able to explain home exercise prescription to exercise independently          Exercise Goals Re-Evaluation :   Discharge Exercise  Prescription (Final Exercise Prescription Changes):   Nutrition:  Target Goals: Understanding of nutrition guidelines, daily intake of sodium 1500mg , cholesterol 200mg , calories 30% from fat and 7% or less from saturated fats, daily to have 5 or more servings of fruits and vegetables.  Biometrics:    Nutrition Therapy Plan and Nutrition Goals:   Nutrition Assessments:   Nutrition Goals Re-Evaluation:   Nutrition Goals Re-Evaluation:   Nutrition Goals Discharge (Final Nutrition Goals Re-Evaluation):   Psychosocial: Target Goals: Acknowledge presence or absence of significant depression and/or stress, maximize coping skills, provide positive support system. Participant is able to verbalize types and ability to use techniques and skills needed for reducing stress and depression.  Initial Review & Psychosocial Screening: Initial Psych Review & Screening - 01/10/20 0853      Initial Review   Current issues with  History of Depression;Current Psychotropic Meds      Family Dynamics   Good Support System?  Yes    Comments  Mr. Wnorowski has a positive outlook and attitude. Does have some mild symptoms of depression secondary to not being able to travel to see his newly adopted grandchildren. He is very active with AA at the state level as he is a recoving alcoholic. He is also involved in his church outreach. He has a strong family support system including wife, children in a different state, and a step-daughter and grandchildren that live in [place]. He sees his psychiatrist yearly and is not in therapy at this time as his mental health is very stable. Patient denies barriers to self health management at this time.      Barriers   Psychosocial barriers to participate in program  Psychosocial barriers identified (see note)      Screening Interventions   Interventions  Encouraged to exercise;Provide feedback about the scores to participant;To provide support and resources with  identified psychosocial needs    Expected Outcomes  Short Term goal: Utilizing psychosocial counselor, staff and physician to assist with identification of specific Stressors or current issues interfering with healing process. Setting desired goal for each stressor or current issue identified.;Long Term Goal: Stressors or current issues are controlled or eliminated.;Short Term goal: Identification and review with participant of any Quality of Life or Depression concerns found by scoring the questionnaire.;Long Term goal: The participant improves quality of Life and PHQ9 Scores as seen by post scores and/or verbalization of changes       Quality of Life Scores: Quality of Life - 01/10/20 1231      Quality of Life   Select  Quality of Life      Quality of Life Scores   Health/Function Pre  15 %    Socioeconomic Pre  20.67 %    Psych/Spiritual Pre  20.3 %  Family Pre  22.7 %    GLOBAL Pre  18.41 %      Scores of 19 and below usually indicate a poorer quality of life in these areas.  A difference of  2-3 points is a clinically meaningful difference.  A difference of 2-3 points in the total score of the Quality of Life Index has been associated with significant improvement in overall quality of life, self-image, physical symptoms, and general health in studies assessing change in quality of life.  PHQ-9: Recent Review Flowsheet Data    Depression screen [facility] 2/9 01/10/2020 09/29/2019 07/12/2018 06/29/2017 01/31/2016   Decreased Interest 0 1 0 0 0   Down, Depressed, Hopeless 0 0 0 0 0   PHQ - 2 Score 0 1 0 0 0     Interpretation of Total Score  Total Score Depression Severity:  1-4 = Minimal depression, 5-9 = Mild depression, 10-14 = Moderate depression, 15-19 = Moderately severe depression, 20-27 = Severe depression   Psychosocial Evaluation and Intervention:   Psychosocial Re-Evaluation:   Psychosocial Discharge (Final Psychosocial Re-Evaluation):   Vocational Rehabilitation: Provide  vocational rehab assistance to qualifying candidates.   Vocational Rehab Evaluation & Intervention: Vocational Rehab - 01/10/20 0849      Initial Vocational Rehab Evaluation & Intervention   Assessment shows need for Vocational Rehabilitation  No       Education: Education Goals: Education classes will be provided on a weekly basis, covering required topics. Participant will state understanding/return demonstration of topics presented.  Learning Barriers/Preferences:   Education Topics: Count Your Pulse:  -Group instruction provided by verbal instruction, demonstration, patient participation and written materials to support subject.  Instructors address importance of being able to find your pulse and how to count your pulse when at home without a heart monitor.  Patients get hands on experience counting their pulse with staff help and individually.   Heart Attack, Angina, and Risk Factor Modification:  -Group instruction provided by verbal instruction, video, and written materials to support subject.  Instructors address signs and symptoms of angina and heart attacks.    Also discuss risk factors for heart disease and how to make changes to improve heart health risk factors.   Functional Fitness:  -Group instruction provided by verbal instruction, demonstration, patient participation, and written materials to support subject.  Instructors address safety measures for doing things around the house.  Discuss how to get up and down off the floor, how to pick things up properly, how to safely get out of a chair without assistance, and balance training.   Meditation and Mindfulness:  -Group instruction provided by verbal instruction, patient participation, and written materials to support subject.  Instructor addresses importance of mindfulness and meditation practice to help reduce stress and improve awareness.  Instructor also leads participants through a meditation exercise.     Stretching for Flexibility and Mobility:  -Group instruction provided by verbal instruction, patient participation, and written materials to support subject.  Instructors lead participants through series of stretches that are designed to increase flexibility thus improving mobility.  These stretches are additional exercise for major muscle groups that are typically performed during regular warm up and cool down.   Hands Only CPR:  -Group verbal, video, and participation provides a basic overview of AHA guidelines for community CPR. Role-play of emergencies allow participants the opportunity to practice calling for help and chest compression technique with discussion of AED use.   Hypertension: -Group verbal and written instruction that provides a  basic overview of hypertension including the most recent diagnostic guidelines, risk factor reduction with self-care instructions and medication management.    Nutrition I class: Heart Healthy Eating:  -Group instruction provided by PowerPoint slides, verbal discussion, and written materials to support subject matter. The instructor gives an explanation and review of the Therapeutic Lifestyle Changes diet recommendations, which includes a discussion on lipid goals, dietary fat, sodium, fiber, plant stanol/sterol esters, sugar, and the components of a well-balanced, healthy diet.   Nutrition II class: Lifestyle Skills:  -Group instruction provided by PowerPoint slides, verbal discussion, and written materials to support subject matter. The instructor gives an explanation and review of label reading, grocery shopping for heart health, heart healthy recipe modifications, and ways to make healthier choices when eating out.   Diabetes Question & Answer:  -Group instruction provided by PowerPoint slides, verbal discussion, and written materials to support subject matter. The instructor gives an explanation and review of diabetes co-morbidities, pre-  and post-prandial blood glucose goals, pre-exercise blood glucose goals, signs, symptoms, and treatment of hypoglycemia and hyperglycemia, and foot care basics.   Diabetes Blitz:  -Group instruction provided by PowerPoint slides, verbal discussion, and written materials to support subject matter. The instructor gives an explanation and review of the physiology behind type 1 and type 2 diabetes, diabetes medications and rational behind using different medications, pre- and post-prandial blood glucose recommendations and Hemoglobin A1c goals, diabetes diet, and exercise including blood glucose guidelines for exercising safely.    Portion Distortion:  -Group instruction provided by PowerPoint slides, verbal discussion, written materials, and food models to support subject matter. The instructor gives an explanation of serving size versus portion size, changes in portions sizes over the last 20 years, and what consists of a serving from each food group.   Stress Management:  -Group instruction provided by verbal instruction, video, and written materials to support subject matter.  Instructors review role of stress in heart disease and how to cope with stress positively.     Exercising on Your Own:  -Group instruction provided by verbal instruction, power point, and written materials to support subject.  Instructors discuss benefits of exercise, components of exercise, frequency and intensity of exercise, and end points for exercise.  Also discuss use of nitroglycerin and activating EMS.  Review options of places to exercise outside of rehab.  Review guidelines for sex with heart disease.   Cardiac Drugs I:  -Group instruction provided by verbal instruction and written materials to support subject.  Instructor reviews cardiac drug classes: antiplatelets, anticoagulants, beta blockers, and statins.  Instructor discusses reasons, side effects, and lifestyle considerations for each drug  class.   Cardiac Drugs II:  -Group instruction provided by verbal instruction and written materials to support subject.  Instructor reviews cardiac drug classes: angiotensin converting enzyme inhibitors (ACE-I), angiotensin II receptor blockers (ARBs), nitrates, and calcium channel blockers.  Instructor discusses reasons, side effects, and lifestyle considerations for each drug class.   Anatomy and Physiology of the Circulatory System:  Group verbal and written instruction and models provide basic cardiac anatomy and physiology, with the coronary electrical and arterial systems. Review of: AMI, Angina, Valve disease, Heart Failure, Peripheral Artery Disease, Cardiac Arrhythmia, Pacemakers, and the ICD.   Other Education:  -Group or individual verbal, written, or video instructions that support the educational goals of the cardiac rehab program.   Holiday Eating Survival Tips:  -Group instruction provided by PowerPoint slides, verbal discussion, and written materials to support subject matter. The instructor  gives patients tips, tricks, and techniques to help them not only survive but enjoy the holidays despite the onslaught of food that accompanies the holidays.   Knowledge Questionnaire Score: Knowledge Questionnaire Score - 01/10/20 1232      Knowledge Questionnaire Score   Pre Score  25/28       Core Components/Risk Factors/Patient Goals at Admission: Personal Goals and Risk Factors at Admission - 01/10/20 0852      Core Components/Risk Factors/Patient Goals on Admission   Tobacco Cessation  Yes    Intervention  Assist the participant in steps to quit. Provide individualized education and counseling about committing to Tobacco Cessation, relapse prevention, and pharmacological support that can be provided by physician.;Advice worker, assist with locating and accessing local/national Quit Smoking programs, and support quit date choice.    Expected Outcomes  Short  Term: Will demonstrate readiness to quit, by selecting a quit date.;Short Term: Will quit all tobacco product use, adhering to prevention of relapse plan.;Long Term: Complete abstinence from all tobacco products for at least 12 months from quit date.    Hypertension  Yes    Intervention  Provide education on lifestyle modifcations including regular physical activity/exercise, weight management, moderate sodium restriction and increased consumption of fresh fruit, vegetables, and low fat dairy, alcohol moderation, and smoking cessation.;Monitor prescription use compliance.    Expected Outcomes  Short Term: Continued assessment and intervention until BP is < 140/17mm HG in hypertensive participants. < 130/45mm HG in hypertensive participants with diabetes, heart failure or chronic kidney disease.;Long Term: Maintenance of blood pressure at goal levels.    Lipids  Yes    Intervention  Provide education and support for participant on nutrition & aerobic/resistive exercise along with prescribed medications to achieve LDL 70mg , HDL >40mg .    Expected Outcomes  Short Term: Participant states understanding of desired cholesterol values and is compliant with medications prescribed. Participant is following exercise prescription and nutrition guidelines.;Long Term: Cholesterol controlled with medications as prescribed, with individualized exercise RX and with personalized nutrition plan. Value goals: LDL < 70mg , HDL > 40 mg.       Core Components/Risk Factors/Patient Goals Review:    Core Components/Risk Factors/Patient Goals at Discharge (Final Review):    ITP Comments: ITP Comments    Row Name 01/10/20 0838           ITP Comments  Dr. Fransico Him, Medical Director Cardiac Rehab [facility]          Comments: Patient attended orientation on 01/10/2020 to review rules and guidelines for program.  Completed 6 minute walk test, Intitial ITP, and exercise prescription.  VSS. Telemetry-NSR.   Asymptomatic. Safety measures and social distancing in place per CDC guidelines. Patient assisted with downloading [facility] app and app use tutorial provided.   Cardiac Rehab Medication Review by a RN  Does the patient  feel that his/her medications are working for him/her?  yes  Has the patient been experiencing any side effects to the medications prescribed?  no  Does the patient measure his/her own blood pressure or blood glucose at home?  no   Does the patient have any problems obtaining medications due to transportation or finances?   no  Understanding of regimen: excellent Understanding of indications: excellent Potential of compliance: excellent    RN comments: No barriers to med compliance. In process of purchasing a home BP machine.    Eleonore Shippee Minus Breeding 01/10/2020 1:46 PM

## 2020-01-10 NOTE — Progress Notes (Signed)
Cardiac Rehab Note:            Confirm Consent - In the setting of the current Covid19 crisis, you are scheduled for a phone visit with your Cardiac or Pulmonary team member.  Just as we do with many in-gym visits, in order for you to participate in this visit, we must obtain consent.  If you'd like, I can send this to your mychart (if signed up) or email for you to review.  Otherwise, I can obtain your verbal consent now.  By agreeing to a telephone visit, we'd like you to understand that the technology does not allow for your Cardiac or Pulmonary Rehab team member to perform a physical assessment, and thus may limit their ability to fully assess your ability to perform exercise programs. If your provider identifies any concerns that need to be evaluated in person, we will make arrangements to do so.  Finally, though the technology is pretty good, we cannot assure that it will always work on either your or our end and we cannot ensure that we have a secure connection.  Cardiac and Pulmonary Rehab Telehealth visits and "At Home" cardiac and pulmonary rehab are provided at no cost to you.        Are you willing to proceed?"        STAFF: Did the patient verbally acknowledge consent to telehealth visit? Document YES/NO here: Yes     Kenyetta Fife E. Laray Anger, BSN  Cardiac and Pulmonary Rehab Staff        Date 01/10/20    @ Time [phone]

## 2020-01-10 NOTE — Progress Notes (Signed)
Cardiac Rehab Note:  Spoke to pt regarding Virtual Cardiac  and Pulmonary Rehab.  Pt  was able to download the [employer] app on their smart device with no issues. Pt set up their account and received the following welcome message -"Welcome to the [facility] program. We hope that you will find the exercise program beneficial in your recovery process. Our staff is available to assist with any questions/concerns about your exercise routine. Best wishes". Brief orientation provided to with the advisement to watch the "Intro to Rehab" series located under the Resource tab. Pt verbalized understanding. Will continue to follow and monitor pt progress with feedback as needed.  Malynda Smolinski E. Blaike Newburn RN, BSN 

## 2020-01-13 ENCOUNTER — Telehealth (HOSPITAL_COMMUNITY): Payer: Self-pay | Admitting: *Deleted

## 2020-01-16 ENCOUNTER — Encounter (HOSPITAL_COMMUNITY)
Admission: RE | Admit: 2020-01-16 | Discharge: 2020-01-16 | Disposition: A | Discharge status: Home / Self Care | Payer: PPO | Source: Ambulatory Visit

## 2020-01-16 ENCOUNTER — Telehealth (HOSPITAL_COMMUNITY): Payer: Self-pay | Admitting: *Deleted

## 2020-01-16 ENCOUNTER — Other Ambulatory Visit: Payer: Self-pay

## 2020-01-16 ENCOUNTER — Encounter (HOSPITAL_COMMUNITY)
Admission: RE | Admit: 2020-01-16 | Discharge: 2020-01-16 | Disposition: A | Discharge status: Home / Self Care | Payer: PPO | Source: Ambulatory Visit | Attending: Cardiology | Admitting: Cardiology

## 2020-01-16 NOTE — Telephone Encounter (Signed)
Spoke to [name]  regarding symptoms of being lightheaded documented on the virtual cardiac rehab APP on 01/14/20 and complaints of soreness and burning in his buttocks on 01/15/20. [name] says that on Friday he went out walking without eating breakfast and says he thinks he had symptoms due to having an empty stomach. [name] says he has not had a re occurrence of those symptoms. [name] says that whenever he walks any extended period of time he experiences soreness and cramping in his buttocks. Will notify [name] office about the patients symptoms as Gregory [name] says that he was supposed to see [name] for PAD. Gregory continues to smoke and said that he is working on trying to quit. [name] says that he has a quit date of January 31.Patient is currently using nicotine patches and says he has cut down on the amount of cigarettes he smokes a day   Gregory asked about deep stretching on the floor as he used to do prior to surgery. I advised the patient not to stretch on the floor right now and that if he needs more guidance as to when that will be okay to contact Dr [name] office. Patient states understanding. Will continue to follow the patient virtually on the [employer] APP. Patient advised to check his blood pressures on his home monitor as he has not been checking his blood pressures at home.[name], RN,BSN 01/16/2020 11:46 AM

## 2020-01-17 ENCOUNTER — Other Ambulatory Visit: Payer: Self-pay

## 2020-01-30 ENCOUNTER — Telehealth (HOSPITAL_COMMUNITY): Payer: Self-pay

## 2020-01-30 ENCOUNTER — Ambulatory Visit: Payer: PPO | Attending: Internal Medicine

## 2020-01-30 DIAGNOSIS — Z23 Encounter for immunization: Secondary | ICD-10-CM

## 2020-01-30 NOTE — Progress Notes (Signed)
   Covid-19 Vaccination Clinic  Name:  Gregory [name]    MRN: [id number] DOB: 04-27-1951  01/30/2020  [name] was observed post Covid-19 immunization for 30 minutes based on pre-vaccination screening without incidence. He was provided with Vaccine Information Sheet and instruction to access the V-Safe system.   [name] was instructed to call 911 with any severe reactions post vaccine: [name] Difficulty breathing  . Swelling of your face and throat  . A fast heartbeat  . A bad rash all over your body  . Dizziness and weakness    Immunizations Administered    Name Date Dose VIS Date Route   Pfizer COVID-19 Vaccine 01/30/2020 11:06 AM 0.3 mL 12/02/2019 Intramuscular   Manufacturer: [place]   Lot: [id number]   [place]: [id number]

## 2020-01-30 NOTE — Addendum Note (Signed)
Encounter addended by: Sol Passer on: 01/30/2020 2:07 PM  Actions taken: Flowsheet data copied forward, Flowsheet accepted

## 2020-01-30 NOTE — Telephone Encounter (Signed)
Pt insurance is active and benefits verified through [employer] adv Co-pay $15, DED 0/0 met, out of pocket $3,400/0 met, co-insurance 0%. no pre-authorization required, REF# [id number]  Will contact patient to see if he is interested in the Cardiac Rehab Program. If interested, patient will need to complete follow up appt. Once completed, patient will be contacted for scheduling upon review by the RN Navigator.

## 2020-01-31 ENCOUNTER — Encounter (HOSPITAL_COMMUNITY): Payer: Self-pay | Admitting: *Deleted

## 2020-01-31 ENCOUNTER — Encounter (HOSPITAL_COMMUNITY): Payer: Self-pay

## 2020-01-31 DIAGNOSIS — Z951 Presence of aortocoronary bypass graft: Secondary | ICD-10-CM

## 2020-01-31 DIAGNOSIS — Z953 Presence of xenogenic heart valve: Secondary | ICD-10-CM

## 2020-01-31 NOTE — Progress Notes (Signed)
Cardiac Individual Treatment Plan  Patient Details  Name: Gregory [name] MRN: [id number] Date of Birth: 1951/12/21 Referring Provider:     [place] from 01/10/2020 in [place]  Referring Provider  [name], Peter, MD      Initial Encounter Date:    CARDIAC REHAB PHASE II ORIENTATION from 01/10/2020 in [place]  Date  01/10/20      Visit Diagnosis: S/P aortic valve replacement with bioprosthetic valve  S/P CABG x 1  Patient's Home Medications on Admission:  Current Outpatient Medications:  .  acetaminophen (TYLENOL) 325 MG tablet, Take 2 tablets (650 mg total) by mouth every 6 (six) hours as needed for mild pain., Disp:  , Rfl:  .  albuterol (PROVENTIL HFA;VENTOLIN HFA) 108 (90 Base) MCG/ACT inhaler, Inhale 2 puffs into the lungs every 6 (six) hours as needed for wheezing., Disp: 3 Inhaler, Rfl: 1 .  amLODipine (NORVASC) 10 MG tablet, TAKE ONE TABLET BY MOUTH DAILY, Disp: 90 tablet, Rfl: 1 .  amoxicillin (AMOXIL) 500 MG capsule, Take four capsules one hour before dental appointment., Disp: 4 capsule, Rfl: 1 .  aspirin EC 325 MG EC tablet, Take 1 tablet (325 mg total) by mouth daily., Disp: 30 tablet, Rfl: 0 .  atorvastatin (LIPITOR) 80 MG tablet, Take 1 tablet (80 mg total) by mouth daily., Disp: 90 tablet, Rfl: 3 .  divalproex (DEPAKOTE) 500 MG DR tablet, Take 500 mg by mouth at bedtime. 2-3 tabs, Disp: , Rfl:  .  finasteride (PROSCAR) 5 MG tablet, TAKE ONE TABLET BY MOUTH DAILY, Disp: 90 tablet, Rfl: 2 .  fluticasone (FLONASE) 50 MCG/ACT nasal spray, SPRAY TWO SPRAYS IN THE AFFECTED NOSTRIL DAILY, Disp: 16 g, Rfl: 11 .  Fluticasone-Salmeterol (ADVAIR DISKUS) 500-50 MCG/DOSE AEPB, Inhale 1 puff into the lungs every 12 (twelve) hours., Disp: 180 each, Rfl: 3 .  lamoTRIgine (LAMICTAL) 200 MG tablet, Take 1 tablet (200 mg total) by mouth every morning., Disp: 90 tablet, Rfl: 3 .   lisinopril-hydrochlorothiazide (ZESTORETIC) 20-12.5 MG tablet, TAKE ONE TABLET BY MOUTH EVERY MORNING, Disp: 90 tablet, Rfl: 2 .  metoprolol tartrate (LOPRESSOR) 25 MG tablet, , Disp: , Rfl:  .  Multiple Vitamins-Minerals (MULTIVITAMIN ADULTS 50+ PO), Take 1 tablet by mouth daily. , Disp: , Rfl:   Current Facility-Administered Medications:  .  sodium chloride flush (NS) 0.9 % injection 3 mL, 3 mL, Intravenous, Q12H, [name], [name], MD  Past Medical History: Past Medical History:  Diagnosis Date  . Alcohol abuse   . Allergy   . Aortic stenosis   . Arthritis   . Asthma   . Bipolar disorder ([place])   . CAD (coronary artery disease)    coronary calcifications 08/2013 CTA  . Carotid artery occlusion   . Chronic back pain   . Claudication ([place])   . Depression   . Family history of skin cancer   . Heart murmur   . Hepatitis    h/o 1970,doesn't remember which type,1990 epstain-barr  . Hyperlipidemia   . Hypertension   . Neuromuscular disorder ([place])   . Obsessive compulsive disorder   . PAD (peripheral artery disease) ([place])   . Peripheral neuropathy   . Pneumonia   . RBBB   . 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 Severe aortic stenosis   . Sleep apnea    does not wear CPAP  . Smoker   . Spinal  stenosis at L4-L5 level   . Tobacco abuse   . Tobacco use disorder     Tobacco Use: Social History   Tobacco Use  Smoking Status Current Every Day Smoker  . Packs/day: 1.00  . Years: 25.00  . Pack years: 25.00  . Types: Cigarettes  Smokeless Tobacco Never Used  Tobacco Comment   Pt had stopped smoking X10 years/ currently smoking a pack a day     Labs: Recent Review Flowsheet Data    Labs for ITP Cardiac and Pulmonary Rehab Latest Ref Rng & Units 11/24/2019 11/24/2019 11/24/2019 11/24/2019 11/24/2019   Cholestrol 100 - 199 mg/dL - - - - -   LDLCALC 0 - 99 mg/dL - - - - -   HDL >39 mg/dL - - - - -   Trlycerides 0 - 149 mg/dL - - - - -    Hemoglobin A1c 4.8 - 5.6 % - - - - -   PHART 7.350 - 7.450 - - 7.375 7.340(L) 7.349(L)   PCO2ART 32.0 - 48.0 mmHg - - 40.1 44.4 44.0   HCO3 20.0 - 28.0 mmol/L - - 23.7 23.9 24.2   TCO2 22 - 32 mmol/L 28 28 25 25 26    ACIDBASEDEF 0.0 - 2.0 mmol/L - - 2.0 2.0 1.0   O2SAT % - - 99.0 99.0 99.0      Capillary Blood Glucose: Lab Results  Component Value Date   GLUCAP 92 11/28/2019   GLUCAP 133 (H) 11/27/2019   GLUCAP 109 (H) 11/27/2019   GLUCAP 122 (H) 11/27/2019   GLUCAP 91 11/27/2019     Exercise Target Goals: Exercise Program Goal: Individual exercise prescription set using results from initial 6 min walk test and THRR while considering  patient's activity barriers and safety.   Exercise Prescription Goal: Starting with aerobic activity 30 plus minutes a day, 3 days per week for initial exercise prescription. Provide home exercise prescription and guidelines that participant acknowledges understanding prior to discharge.  Activity Barriers & Risk Stratification: Activity Barriers & Cardiac Risk Stratification - 01/10/20 1229      Activity Barriers & Cardiac Risk Stratification   Activity Barriers  Other (comment)    Comments  leg numbness    Cardiac Risk Stratification  High       6 Minute Walk: 6 Minute Walk    Row Name 01/10/20 0919         6 Minute Walk   Phase  Initial     Distance  1001 feet     Walk Time  6 minutes     # of Rest Breaks  1     MPH  1.9     METS  2.53     RPE  11     Perceived Dyspnea   0     VO2 Peak  8.85     Symptoms  Yes (comment)     Comments  Cramping in gluteus area.     Resting HR  64 bpm     Resting BP  114/50     Resting Oxygen Saturation   99 %     Exercise Oxygen Saturation  during 6 min walk  100 %     Max Ex. HR  83 bpm     Max Ex. BP  142/58     2 Minute Post BP  134/60        Oxygen Initial Assessment:   Oxygen Re-Evaluation:   Oxygen Discharge (Final Oxygen Re-Evaluation):  Initial Exercise  Prescription: Initial Exercise Prescription - 01/10/20 1200      Date of Initial Exercise RX and Referring Provider   Date  01/10/20    Referring Provider  [name], Peter, MD      Track   Minutes  30      Prescription Details   Frequency (times per week)  5-7    Duration  Progress to 30 minutes of continuous aerobic without signs/symptoms of physical distress      Intensity   THRR 40-80% of Max Heartrate  60-121    Ratings of Perceived Exertion  11-13    Perceived Dyspnea  0-4      Progression   Progression  Continue to progress workloads to maintain intensity without signs/symptoms of physical distress.      Resistance Training   Training Prescription  Yes    Reps  10-15       Perform Capillary Blood Glucose checks as needed.  Exercise Prescription Changes: Exercise Prescription Changes    Row Name 01/28/20 1355             Response to Exercise   Heart Rate (Admit)  62 bpm       Rating of Perceived Exertion (Exercise)  9       Symptoms  none       Comments  Virtual cardiac rehab       Duration  Progress to 30 minutes of  aerobic without signs/symptoms of physical distress       Intensity  THRR unchanged         Progression   Progression  Continue to progress workloads to maintain intensity without signs/symptoms of physical distress.         Interval Training   Interval Training  No         Track   Minutes  15          Exercise Comments: Exercise Comments    Row Name 01/30/20 1406           Exercise Comments  Patient will be contacted to begin participation in the onsite cardiac rehab program after temporary closure due to COVID-19 pandemic.          Exercise Goals and Review: Exercise Goals    Row Name 01/10/20 1230             Exercise Goals   Increase Physical Activity  Yes       Intervention  Provide advice, education, support and counseling about physical activity/exercise needs.;Develop an individualized exercise prescription for  aerobic and resistive training based on initial evaluation findings, risk stratification, comorbidities and participant's personal goals.       Expected Outcomes  Short Term: Attend rehab on a regular basis to increase amount of physical activity.;Long Term: Exercising regularly at least 3-5 days a week.;Long Term: Add in home exercise to make exercise part of routine and to increase amount of physical activity.       Increase Strength and Stamina  Yes       Intervention  Provide advice, education, support and counseling about physical activity/exercise needs.;Develop an individualized exercise prescription for aerobic and resistive training based on initial evaluation findings, risk stratification, comorbidities and participant's personal goals.       Expected Outcomes  Short Term: Increase workloads from initial exercise prescription for resistance, speed, and METs.;Short Term: Perform resistance training exercises routinely during rehab and add in resistance training at home;Long Term: Improve cardiorespiratory fitness, muscular endurance  and strength as measured by increased METs and functional capacity (6MWT)       Able to understand and use rate of perceived exertion (RPE) scale  Yes       Intervention  Provide education and explanation on how to use RPE scale       Expected Outcomes  Short Term: Able to use RPE daily in rehab to express subjective intensity level;Long Term:  Able to use RPE to guide intensity level when exercising independently       Knowledge and understanding of Target Heart Rate Range (THRR)  Yes       Intervention  Provide education and explanation of THRR including how the numbers were predicted and where they are located for reference       Expected Outcomes  Short Term: Able to state/look up THRR;Long Term: Able to use THRR to govern intensity when exercising independently;Short Term: Able to use daily as guideline for intensity in rehab       Able to check pulse  independently  Yes       Intervention  Provide education and demonstration on how to check pulse in carotid and radial arteries.;Review the importance of being able to check your own pulse for safety during independent exercise       Expected Outcomes  Short Term: Able to explain why pulse checking is important during independent exercise;Long Term: Able to check pulse independently and accurately       Understanding of Exercise Prescription  Yes       Intervention  Provide education, explanation, and written materials on patient's individual exercise prescription       Expected Outcomes  Short Term: Able to explain program exercise prescription;Long Term: Able to explain home exercise prescription to exercise independently          Exercise Goals Re-Evaluation : Exercise Goals Re-Evaluation    Row Name 01/30/20 1401             Exercise Goal Re-Evaluation   Comments  Patient has been actively participating in the virtual cardiac rehab program via the [facility] app and has been doing fairly well. Patient is walking 15-25 minutes as his mode of exercise.       Expected Outcomes  Patient will transition to the onsite cardiac rehab program if interested           Discharge Exercise Prescription (Final Exercise Prescription Changes): Exercise Prescription Changes - 01/28/20 1355      Response to Exercise   Heart Rate (Admit)  62 bpm    Rating of Perceived Exertion (Exercise)  9    Symptoms  none    Comments  Virtual cardiac rehab    Duration  Progress to 30 minutes of  aerobic without signs/symptoms of physical distress    Intensity  THRR unchanged      Progression   Progression  Continue to progress workloads to maintain intensity without signs/symptoms of physical distress.      Interval Training   Interval Training  No      Track   Minutes  15       Nutrition:  Target Goals: Understanding of nutrition guidelines, daily intake of sodium 1500mg , cholesterol 200mg ,  calories 30% from fat and 7% or less from saturated fats, daily to have 5 or more servings of fruits and vegetables.  Biometrics:    Nutrition Therapy Plan and Nutrition Goals:   Nutrition Assessments:   Nutrition Goals Re-Evaluation:   Nutrition Goals  Discharge (Final Nutrition Goals Re-Evaluation):   Psychosocial: Target Goals: Acknowledge presence or absence of significant depression and/or stress, maximize coping skills, provide positive support system. Participant is able to verbalize types and ability to use techniques and skills needed for reducing stress and depression.  Initial Review & Psychosocial Screening: Initial Psych Review & Screening - 01/10/20 0853      Initial Review   Current issues with  History of Depression;Current Psychotropic Meds      Family Dynamics   Good Support System?  Yes    Comments  Gregory [name] has a positive outlook and attitude. Does have some mild symptoms of depression secondary to not being able to travel to see his newly adopted grandchildren. He is very active with AA at the state level as he is a recoving alcoholic. He is also involved in his church outreach. He has a strong family support system including wife, children in a different state, and a step-daughter and grandchildren that live in [place]. He sees his psychiatrist yearly and is not in therapy at this time as his mental health is very stable. Patient denies barriers to self health management at this time.      Barriers   Psychosocial barriers to participate in program  Psychosocial barriers identified (see note)      Screening Interventions   Interventions  Encouraged to exercise;Provide feedback about the scores to participant;To provide support and resources with identified psychosocial needs    Expected Outcomes  Short Term goal: Utilizing psychosocial counselor, staff and physician to assist with identification of specific Stressors or current issues interfering with  healing process. Setting desired goal for each stressor or current issue identified.;Long Term Goal: Stressors or current issues are controlled or eliminated.;Short Term goal: Identification and review with participant of any Quality of Life or Depression concerns found by scoring the questionnaire.;Long Term goal: The participant improves quality of Life and PHQ9 Scores as seen by post scores and/or verbalization of changes       Quality of Life Scores: Quality of Life - 01/10/20 1231      Quality of Life   Select  Quality of Life      Quality of Life Scores   Health/Function Pre  15 %    Socioeconomic Pre  20.67 %    Psych/Spiritual Pre  20.3 %    Family Pre  22.7 %    GLOBAL Pre  18.41 %      Scores of 19 and below usually indicate a poorer quality of life in these areas.  A difference of  2-3 points is a clinically meaningful difference.  A difference of 2-3 points in the total score of the Quality of Life Index has been associated with significant improvement in overall quality of life, self-image, physical symptoms, and general health in studies assessing change in quality of life.  PHQ-9: Recent Review Flowsheet Data    Depression screen [facility] 2/9 01/10/2020 09/29/2019 07/12/2018 06/29/2017 01/31/2016   Decreased Interest 0 1 0 0 0   Down, Depressed, Hopeless 0 0 0 0 0   PHQ - 2 Score 0 1 0 0 0     Interpretation of Total Score  Total Score Depression Severity:  1-4 = Minimal depression, 5-9 = Mild depression, 10-14 = Moderate depression, 15-19 = Moderately severe depression, 20-27 = Severe depression   Psychosocial Evaluation and Intervention:   Psychosocial Re-Evaluation: Psychosocial Re-Evaluation    [place] Name 01/31/20 1057  Psychosocial Re-Evaluation   Comments  Unable to reassess as patient is using virtual cardiac rehab through the [facility] APP at this time.          Psychosocial Discharge (Final Psychosocial Re-Evaluation): Psychosocial  Re-Evaluation - 01/31/20 1057      Psychosocial Re-Evaluation   Comments  Unable to reassess as patient is using virtual cardiac rehab through the [facility] APP at this time.       Vocational Rehabilitation: Provide vocational rehab assistance to qualifying candidates.   Vocational Rehab Evaluation & Intervention: Vocational Rehab - 01/10/20 0849      Initial Vocational Rehab Evaluation & Intervention   Assessment shows need for Vocational Rehabilitation  No       Education: Education Goals: Education classes will be provided on a weekly basis, covering required topics. Participant will state understanding/return demonstration of topics presented.  Learning Barriers/Preferences:   Education Topics: Hypertension, Hypertension Reduction -Define heart disease and high blood pressure. Discus how high blood pressure affects the body and ways to reduce high blood pressure.   Exercise and Your Heart -Discuss why it is important to exercise, the FITT principles of exercise, normal and abnormal responses to exercise, and how to exercise safely.   Angina -Discuss definition of angina, causes of angina, treatment of angina, and how to decrease risk of having angina.   Cardiac Medications -Review what the following cardiac medications are used for, how they affect the body, and side effects that may occur when taking the medications.  Medications include Aspirin, Beta blockers, calcium channel blockers, ACE Inhibitors, angiotensin receptor blockers, diuretics, digoxin, and antihyperlipidemics.   Congestive Heart Failure -Discuss the definition of CHF, how to live with CHF, the signs and symptoms of CHF, and how keep track of weight and sodium intake.   Heart Disease and Intimacy -Discus the effect sexual activity has on the heart, how changes occur during intimacy as we age, and safety during sexual activity.   Smoking Cessation / COPD -Discuss different methods to quit  smoking, the health benefits of quitting smoking, and the definition of COPD.   Nutrition I: Fats -Discuss the types of cholesterol, what cholesterol does to the heart, and how cholesterol levels can be controlled.   Nutrition II: Labels -Discuss the different components of food labels and how to read food label   Heart Parts/Heart Disease and PAD -Discuss the anatomy of the heart, the pathway of blood circulation through the heart, and these are affected by heart disease.   Stress I: Signs and Symptoms -Discuss the causes of stress, how stress may lead to anxiety and depression, and ways to limit stress.   Stress II: Relaxation -Discuss different types of relaxation techniques to limit stress.   Warning Signs of Stroke / TIA -Discuss definition of a stroke, what the signs and symptoms are of a stroke, and how to identify when someone is having stroke.   Knowledge Questionnaire Score: Knowledge Questionnaire Score - 01/10/20 1232      Knowledge Questionnaire Score   Pre Score  25/28       Core Components/Risk Factors/Patient Goals at Admission: Personal Goals and Risk Factors at Admission - 01/10/20 0852      Core Components/Risk Factors/Patient Goals on Admission   Tobacco Cessation  Yes    Intervention  Assist the participant in steps to quit. Provide individualized education and counseling about committing to Tobacco Cessation, relapse prevention, and pharmacological support that can be provided by physician.;Advice worker, assist with  locating and accessing local/national Quit Smoking programs, and support quit date choice.    Expected Outcomes  Short Term: Will demonstrate readiness to quit, by selecting a quit date.;Short Term: Will quit all tobacco product use, adhering to prevention of relapse plan.;Long Term: Complete abstinence from all tobacco products for at least 12 months from quit date.    Hypertension  Yes    Intervention  Provide education  on lifestyle modifcations including regular physical activity/exercise, weight management, moderate sodium restriction and increased consumption of fresh fruit, vegetables, and low fat dairy, alcohol moderation, and smoking cessation.;Monitor prescription use compliance.    Expected Outcomes  Short Term: Continued assessment and intervention until BP is < 140/12mm HG in hypertensive participants. < 130/65mm HG in hypertensive participants with diabetes, heart failure or chronic kidney disease.;Long Term: Maintenance of blood pressure at goal levels.    Lipids  Yes    Intervention  Provide education and support for participant on nutrition & aerobic/resistive exercise along with prescribed medications to achieve LDL 70mg , HDL >40mg .    Expected Outcomes  Short Term: Participant states understanding of desired cholesterol values and is compliant with medications prescribed. Participant is following exercise prescription and nutrition guidelines.;Long Term: Cholesterol controlled with medications as prescribed, with individualized exercise RX and with personalized nutrition plan. Value goals: LDL < 70mg , HDL > 40 mg.       Core Components/Risk Factors/Patient Goals Review:  Goals and Risk Factor Review    Row Name 01/31/20 1100             Core Components/Risk Factors/Patient Goals Review   Personal Goals Review  Tobacco Cessation;Hypertension;Lipids       Review  Gregory [name] is exercising and using the virtual [facility] APP for exercise. Patient continues to work on smoking cessation.       Expected Outcomes  Patient will continue to participate in cardiac rehab for exercise, nutrtion and lifestyle modifications          Core Components/Risk Factors/Patient Goals at Discharge (Final Review):  Goals and Risk Factor Review - 01/31/20 1100      Core Components/Risk Factors/Patient Goals Review   Personal Goals Review  Tobacco Cessation;Hypertension;Lipids    Review  Gregory [name] is exercising  and using the virtual [facility] APP for exercise. Patient continues to work on smoking cessation.    Expected Outcomes  Patient will continue to participate in cardiac rehab for exercise, nutrtion and lifestyle modifications       ITP Comments: ITP Comments    Row Name 01/10/20 0838 01/31/20 1055         ITP Comments  Dr. Fransico Him, Medical Director Cardiac Rehab [place]  30 Day ITP Review. Phase 2 cardiac rehab is resuming in person exercise on 02/06/20         Comments: See ITP Comments.Harrell Gave RN BSN

## 2020-01-31 NOTE — Progress Notes (Addendum)
Phone call to Pt to inquire about interest for in person CR program. Pt returned call and stated he was interested in enrolling for in person CR. Pt was scheduled for 02/08/2020 for his first day of exercise. Pt understands the COVID 19 protocols.

## 2020-02-07 ENCOUNTER — Telehealth: Payer: Self-pay

## 2020-02-07 NOTE — Telephone Encounter (Signed)
Spoke to patient about message I received from [facility] at cardiac rehab concerning burning and soreness in buttocks after walking.He stated he has had this for years.I spoke to Dr.Jordan he advised ok to schedule appointment with Dr.Berry.Stated he has appointment with Dr.Jordan 2/23, he wants to wait and discuss at appointment.

## 2020-02-08 ENCOUNTER — Other Ambulatory Visit: Payer: Self-pay

## 2020-02-08 ENCOUNTER — Encounter (HOSPITAL_COMMUNITY)
Admission: RE | Admit: 2020-02-08 | Discharge: 2020-02-08 | Disposition: A | Discharge status: Home / Self Care | Payer: PPO | Source: Ambulatory Visit | Attending: Cardiology | Admitting: Cardiology

## 2020-02-08 VITALS — Ht 64.75 in | Wt 138.2 lb

## 2020-02-08 DIAGNOSIS — Z953 Presence of xenogenic heart valve: Secondary | ICD-10-CM | POA: Diagnosis not present

## 2020-02-08 DIAGNOSIS — Z951 Presence of aortocoronary bypass graft: Secondary | ICD-10-CM | POA: Diagnosis not present

## 2020-02-08 NOTE — Progress Notes (Signed)
Daily Session Note  Patient Details  Name: Gregory [name] MRN: [id number] Date of Birth: 07-22-1951 Referring Provider:     CARDIAC REHAB PHASE II ORIENTATION from 01/10/2020 in [place]  Referring Provider  [name], Peter, MD      Encounter Date: 02/08/2020  Check In: Session Check In - 02/08/20 0905      Check-In   Supervising physician immediately available to respond to emergencies  [facility] immediately available    Physician(s)  Dr. Cyndia Skeeters    Location  [facility]    Staff Present  Jiles Garter, RN, Bjorn Loser, MS, Exercise Physiologist;Portia Rollene Rotunda, RN, BSN;Brittany Durene Fruits, BS, ACSM CEP, Exercise Physiologist    Virtual Visit  No    Medication changes reported      No    Fall or balance concerns reported     No    Tobacco Cessation  No Change    Warm-up and Cool-down  Performed on first and last piece of equipment    Resistance Training Performed  No    VAD Patient?  No    PAD/SET Patient?  No      Pain Assessment   Currently in Pain?  No/denies    Multiple Pain Sites  No       Capillary Blood Glucose: No results found for this or any previous visit (from the past 24 hour(s)).  Exercise Prescription Changes - 02/08/20 1000      Response to Exercise   Blood Pressure (Admit)  112/52    Blood Pressure (Exercise)  146/70    Blood Pressure (Exit)  120/68    Heart Rate (Admit)  81 bpm    Heart Rate (Exercise)  106 bpm    Heart Rate (Exit)  81 bpm    Rating of Perceived Exertion (Exercise)  15    Symptoms  Lower extremity pain.     Comments  --    Duration  Progress to 30 minutes of  aerobic without signs/symptoms of physical distress    Intensity  THRR unchanged      Progression   Progression  Continue to progress workloads to maintain intensity without signs/symptoms of physical distress.    Average METs  2.1      Resistance Training   Training Prescription  No      Interval Training   Interval Training  No      Recumbant Bike   Level  2    Minutes  15    METs  2      NuStep   Level  2    SPM  85    Minutes  15    METs  2.1      Track   Minutes  --       Social History   Tobacco Use  Smoking Status Current Every Day Smoker  . Packs/day: 1.00  . Years: 25.00  . Pack years: 25.00  . Types: Cigarettes  Smokeless Tobacco Never Used  Tobacco Comment   Pt had stopped smoking X10 years/ currently smoking a pack a day     Goals Met:  Exercise tolerated well  Goals Unmet:  Not Applicable  Comments: Pt started cardiac rehab today.  Pt tolerated light exercise without difficulty. VSS, telemetry-SR, asymptomatic.  Medication list reconciled. Pt denies barriers to medicaiton compliance.  PSYCHOSOCIAL ASSESSMENT:  PHQ-0. Pt exhibits positive coping skills, hopeful outlook with supportive family. No psychosocial needs identified at this time, no  psychosocial interventions necessary.  Pt oriented to exercise equipment and routine.    Understanding verbalized.    Dr. Fransico Him is Medical Director for Cardiac Rehab at [facility].

## 2020-02-10 ENCOUNTER — Other Ambulatory Visit: Payer: Self-pay

## 2020-02-10 ENCOUNTER — Encounter (HOSPITAL_COMMUNITY)
Admission: RE | Admit: 2020-02-10 | Discharge: 2020-02-10 | Disposition: A | Discharge status: Home / Self Care | Payer: PPO | Source: Ambulatory Visit | Attending: Cardiology | Admitting: Cardiology

## 2020-02-10 DIAGNOSIS — I442 Atrioventricular block, complete: Secondary | ICD-10-CM | POA: Diagnosis not present

## 2020-02-10 DIAGNOSIS — Z720 Tobacco use: Secondary | ICD-10-CM | POA: Diagnosis not present

## 2020-02-10 DIAGNOSIS — Z951 Presence of aortocoronary bypass graft: Secondary | ICD-10-CM

## 2020-02-10 DIAGNOSIS — Z952 Presence of prosthetic heart valve: Secondary | ICD-10-CM | POA: Diagnosis not present

## 2020-02-10 DIAGNOSIS — E786 Lipoprotein deficiency: Secondary | ICD-10-CM | POA: Diagnosis not present

## 2020-02-10 DIAGNOSIS — I350 Nonrheumatic aortic (valve) stenosis: Secondary | ICD-10-CM | POA: Diagnosis not present

## 2020-02-10 DIAGNOSIS — I739 Peripheral vascular disease, unspecified: Secondary | ICD-10-CM | POA: Diagnosis not present

## 2020-02-10 DIAGNOSIS — Z953 Presence of xenogenic heart valve: Secondary | ICD-10-CM

## 2020-02-10 LAB — BASIC METABOLIC PANEL
BUN/Creatinine Ratio: 14 (ref 10–24)
BUN: 13 mg/dL (ref 8–27)
CO2: 24 mmol/L (ref 20–29)
Calcium: 10 mg/dL (ref 8.6–10.2)
Chloride: 100 mmol/L (ref 96–106)
Creatinine, Ser: 0.9 mg/dL (ref 0.76–1.27)
GFR calc Af Amer: 100 mL/min/{1.73_m2} (ref >59)
GFR calc non Af Amer: 87 mL/min/{1.73_m2} (ref >59)
Glucose: 94 mg/dL (ref 65–99)
Potassium: 5.3 mmol/L — ABNORMAL HIGH (ref 3.5–5.2)
Sodium: 140 mmol/L (ref 134–144)

## 2020-02-10 LAB — HEPATIC FUNCTION PANEL
ALT: 28 IU/L (ref 0–44)
AST: 28 IU/L (ref 0–40)
Albumin: 4.7 g/dL (ref 3.8–4.8)
Alkaline Phosphatase: 91 IU/L (ref 39–117)
Bilirubin Total: 0.3 mg/dL (ref 0.0–1.2)
Bilirubin, Direct: 0.1 mg/dL (ref 0.00–0.40)
Total Protein: 7.8 g/dL (ref 6.0–8.5)

## 2020-02-10 LAB — LIPID PANEL
Chol/HDL Ratio: 3.2 ratio (ref 0.0–5.0)
Cholesterol, Total: 122 mg/dL (ref 100–199)
HDL: 38 mg/dL — ABNORMAL LOW (ref >39)
LDL Chol Calc (NIH): 63 mg/dL (ref 0–99)
Triglycerides: 116 mg/dL (ref 0–149)
VLDL Cholesterol Cal: 21 mg/dL (ref 5–40)

## 2020-02-10 NOTE — Progress Notes (Deleted)
Cardiology Office Note   Date:  02/12/2020   ID:  Gregory, [name] 02/27/1951, MRN [id number]  PCP:  [name], MD  Cardiologist:   Makhari Dovidio [name], MD   No chief complaint on file.     History of Present Illness: Gregory [name] is a 69 y.o. male who is seen for follow up post hospitalization for AVR and CABG. He was last seen by me in October 2015. He has a known history of coronary disease. This is based on a prior CT of the chest in 2014 showing extensive coronary calcification. He had a Myoview study in October 2014 that showed a small area of apical ischema. This was unchanged from 2012 and the patient was asymptomatic so he was treated medially. He also has moderate aortic stenosis.  He has a history of ongoing tobacco abuse, HTN, and hyperlipidemia. More recently he had an Echo done showing his aortic stenosis had progressed and was now in the severe range. Mean gradient 50 mm Hg and valve area 0.84 cm2. Peak velocity 4.7 m/sec. This had progressed from last Echo in 2017. He subsequently underwent cardiac cath showing severe AS, single vessel CAD with 99% proximal to mid LAD, normal right heart pressures.   On 11/24/19 he underwent AVR and CABG by Dr Gregory [name]. This included a LIMA graft to the LAD and AVR with a 23 mm Edwards INSPIRIS RESILIA pericardial valve. His early post op course was complicated by complete heart block. This resolved. He was seen by EP who recommended monitoring. All AV nodal blocking drugs were held. He was started on amlodipine and lisinopril for HTN. A Zio patch monitor was worn and showed no AV block. Repeat Echo in Dec 2020 looked good.  On follow up today he is doing OK. Not walking much but doing his breathing exercises. Cough is improving with Mucinex. Feels muscular pain and spasm at the superior right scapular border radiating to his neck and shoulder. He has contacted "Smoke enders" to try and quit smoking. I have okayed him to use nicotine  replacement products. No fever.    Past Medical History:  Diagnosis Date  . Alcohol abuse   . Allergy   . Aortic stenosis   . Arthritis   . Asthma   . Bipolar disorder ([place])   . CAD (coronary artery disease)    coronary calcifications 08/2013 CTA  . Carotid artery occlusion   . Chronic back pain   . Claudication ([place])   . Depression   . Family history of skin cancer   . Heart murmur   . Hepatitis    h/o 1970,doesn't remember which type,1990 epstain-barr  . Hyperlipidemia   . Hypertension   . Neuromuscular disorder ([place])   . Obsessive compulsive disorder   . PAD (peripheral artery disease) ([place])   . Peripheral neuropathy   . Pneumonia   . RBBB   . 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 Severe aortic stenosis   . Sleep apnea    does not wear CPAP  . Smoker   . Spinal stenosis at L4-L5 level   . Tobacco abuse   . Tobacco use disorder     Past Surgical History:  Procedure Laterality Date  . ANAL FISTULECTOMY  09/25/11  . AORTIC VALVE REPLACEMENT N/A 11/24/2019   Procedure: AORTIC VALVE REPLACEMENT (AVR) using INSPIRIS Resilia 23 MM Bioprosthetic Aortic Valve.;  Surgeon: Gregory Pollack, MD;  [facility]: [facility];  Service: Open Heart Surgery;  Laterality: N/A;  . COLONOSCOPY  2009  . CORONARY ARTERY BYPASS GRAFT N/A 11/24/2019   Procedure: CORONARY ARTERY BYPASS GRAFTING (CABG) using LIMA to LAD.;  Surgeon: Gregory Pollack, MD;  Location: [facility] OR;  Service: Open Heart Surgery;  Laterality: N/A;  . ELBOW SURGERY     bilaterally for cubital tunnel  . HERNIA REPAIR     with mesh  . MAXIMUM ACCESS (MAS)POSTERIOR LUMBAR INTERBODY FUSION (PLIF) 1 LEVEL N/A 10/25/2014   Procedure: LUMBAR FOUR TO FIVE MAXIMUM ACCESS (MAS) POSTERIOR LUMBAR INTERBODY FUSION (PLIF) 1 LEVEL;  Surgeon: Gregory Moore, MD;  Location: [facility] NE[facility]O ORS;  Service: Neurosurgery;  Laterality: N/A;  . RIGHT/LEFT HEART CATH AND CORONARY ANGIOGRAPHY N/A 11/10/2019   Procedure: RIGHT/LEFT  HEART CATH AND CORONARY ANGIOGRAPHY;  Surgeon: Gregory, Lulia [name] M, MD;  Location: [facility] CV LAB;  Service: Cardiovascular;  Laterality: N/A;  . 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 TEE WITHOUT CARDIOVERSION N/A 11/24/2019   Procedure: TRANSESOPHAGEAL ECHOCARDIOGRAM (TEE);  Surgeon: Gregory Pollack, MD;  Location: [facility];  Service: Open Heart Surgery;  Laterality: N/A;  . TONSILLECTOMY  age 2  . VASECTOMY     X 2  . VASECTOMY REVERSAL       Current Outpatient Medications  Medication Sig Dispense Refill  . acetaminophen (TYLENOL) 325 MG tablet Take 2 tablets (650 mg total) by mouth every 6 (six) hours as needed for mild pain.    [name] albuterol (PROVENTIL HFA;VENTOLIN HFA) 108 (90 Base) MCG/ACT inhaler Inhale 2 puffs into the lungs every 6 (six) hours as needed for wheezing. 3 Inhaler 1  . amLODipine (NORVASC) 10 MG tablet TAKE ONE TABLET BY MOUTH DAILY 90 tablet 1  . amoxicillin (AMOXIL) 500 MG capsule Take four capsules one hour before dental appointment. 4 capsule 1  . aspirin EC 325 MG EC tablet Take 1 tablet (325 mg total) by mouth daily. 30 tablet 0  . atorvastatin (LIPITOR) 80 MG tablet Take 1 tablet (80 mg total) by mouth daily. 90 tablet 3  . divalproex (DEPAKOTE) 500 MG DR tablet Take 500 mg by mouth at bedtime. 2-3 tabs    . finasteride (PROSCAR) 5 MG tablet TAKE ONE TABLET BY MOUTH DAILY 90 tablet 2  . fluticasone (FLONASE) 50 MCG/ACT nasal spray SPRAY TWO SPRAYS IN THE AFFECTED NOSTRIL DAILY 16 g 11  . Fluticasone-Salmeterol (ADVAIR DISKUS) 500-50 MCG/DOSE AEPB Inhale 1 puff into the lungs every 12 (twelve) hours. 180 each 3  . lamoTRIgine (LAMICTAL) 200 MG tablet Take 1 tablet (200 mg total) by mouth every morning. 90 tablet 3  . lisinopril-hydrochlorothiazide (ZESTORETIC) 20-12.5 MG tablet TAKE ONE TABLET BY MOUTH EVERY MORNING 90 tablet 2  . metoprolol tartrate (LOPRESSOR) 25 MG tablet     . Multiple  Vitamins-Minerals (MULTIVITAMIN ADULTS 50+ PO) Take 1 tablet by mouth daily.      Current Facility-Administered Medications  Medication Dose Route Frequency Provider Last Rate Last Admin  . sodium chloride flush (NS) 0.9 % injection 3 mL  3 mL Intravenous Q12H Gregory, Darryel Diodato M, MD        Allergies:   Codeine    Social History:  The patient  reports that he has been smoking cigarettes. He has a 25.00 pack-year smoking history. He has never used smokeless tobacco. He reports current alcohol use of about 78.0 standard drinks of alcohol per week. He reports that he  does not use drugs.   Family History:  The patient's family history includes Cancer in his father, maternal aunt, and maternal grandmother; Diabetes in his brother; Drug abuse in his brother and father; Heart disease in his brother; Heart failure in his mother; Hypertension in his brother; Stroke (age of onset: 60) in his father.    ROS:  Please see the history of present illness.   Otherwise, review of systems are positive for none.   All other systems are reviewed and negative.    PHYSICAL EXAM: VS:  There were no vitals taken for this visit. , BMI There is no height or weight on file to calculate BMI. GEN: Well nourished, well developed, in no acute distress  HEENT: normal  Neck: no JVD, carotid bruits, or masses Cardiac: RRR; no murmur.  No  gallops,no edema. Sternal incision is healing well. Sutures in place where MTs were.  Respiratory:  clear to auscultation bilaterally, normal work of breathing GI: soft, nontender, nondistended, + BS MS: no deformity or atrophy. Femoral pulses are 1+ pedal pulses are nonpalpable bilaterally. Skin: warm and dry, no rash Neuro:  Strength and sensation are intact Psych: euthymic mood, full affect   EKG:  EKG is ordered today. The ekg ordered today demonstrates NSR rate 62. RBBB. I have personally reviewed and interpreted this study.    Recent Labs: 11/25/2019: Magnesium 2.7 11/28/2019:  Hemoglobin 9.2; Platelets 123 02/10/2020: ALT 28; BUN 13; Creatinine, Ser 0.90; Potassium 5.3; Sodium 140    Lipid Panel    Component Value Date/Time   CHOL 122 02/10/2020 1109   TRIG 116 02/10/2020 1109   HDL 38 (L) 02/10/2020 1109   CHOLHDL 3.2 02/10/2020 1109   CHOLHDL 5.1 (H) 06/29/2017 1534   VLDL 45 (H) 06/29/2017 1534   LDLCALC 63 02/10/2020 1109      Wt Readings from Last 3 Encounters:  02/08/20 138 lb 3.7 oz (62.7 kg)  01/10/20 138 lb 3.7 oz (62.7 kg)  01/04/20 136 lb 6.4 oz (61.9 kg)      Other studies Reviewed: Additional studies/ records that were reviewed today include:  Cardiology Nuclear Med Study  Rahkeem Torbeck is a 70 y.o. male MRN : [id number] DOB: June 21, 1951  Procedure Date: 09/29/2013  Nuclear Med Background  Indication for Stress Test: Evaluation for Ischemia and Post Hospital-9/14 Pre-syncope, Increased enzymes  History: 21014 Echo EF 55-60%, mod AS, 2012 MPS Abnormal, EF 81%, CT- Coronary Calcifications  Cardiac Risk Factors: Carotid Disease, Family History - CAD, Hypertension, Lipids, RBBB and Smoker  Symptoms: Dizziness, Light-Headedness and Near Syncope  Nuclear Pre-Procedure  Caffeine/Decaff Intake: None  NPO After: 9:00pm   O2 Sat: 97% on room air.  IV 0.9% NS with Angio Cath: 22g   IV Site: R Antecubital  IV Started by: Crissie Figures, RN   Chest Size (in): 42  Cup Size: n/a   Height: 5\' 5"  (1.651 m)  Weight: 134 lb (60.782 kg)   BMI: Body mass index is 22.3 kg/(m^2).  Tech Comments: N/A   Nuclear Med Study  1 or 2 day study: 1 day  Stress Test Type: Lexiscan   Reading MD: Mertie Moores, MD  Order Authorizing Provider: Ernesto Zukowski [name], MD   Resting Radionuclide: Technetium 59m Sestamibi  Resting Radionuclide Dose: 11.0 mCi   Stress Radionuclide: Technetium 66m Sestamibi  Stress Radionuclide Dose: 33.0 mCi   Stress Protocol  Rest HR: 56  Stress HR: 90   Rest BP: 151/73  Stress BP: 156/69   Exercise Time (min):  n/a  METS: n/a   Predicted Max HR:  157 bpm  % Max HR: 57.32 bpm  Rate Pressure Product: 14040  Dose of Adenosine (mg): n/a  Dose of Lexiscan: 0.4 mg   Dose of Atropine (mg): n/a  Dose of Dobutamine: n/a mcg/kg/min (at max HR)   Stress Test Technologist: Glade Lloyd, BS-ES  Nuclear Technologist: Annye Rusk, CNMT   Rest Procedure: Myocardial perfusion imaging was performed at rest 45 minutes following the intravenous administration of Technetium 63m Sestamibi.  Rest ECG: NSR-RBBB  Stress Procedure: The patient received IV Lexiscan 0.4 mg over 15-seconds. Patient experienced stomach "hurting" that resolved in recovery. Technetium 4m Sestamibi injected at 30-seconds. Quantitative spect images were obtained after a 45 minute delay.  Stress ECG: No significant change from baseline ECG  QPS  Raw Data Images: Normal; no motion artifact; normal heart/lung ratio.  Stress Images: there is a small- medium sized area of moderately severe attenuation at the apex with normal uptake in the other regions.  Rest Images: Normal homogeneous uptake in all areas of the myocardium.  Subtraction (SDS): There is a small area of reversible ischemia in the apex.  Transient Ischemic Dilatation (Normal <1.22): N/A  Lung/Heart Ratio (Normal <0.45): 0.35  Quantitative Gated Spect Images  QGS EDV: 91 ml  QGS ESV: 21 ml  Impression  Exercise Capacity: Lexiscan with no exercise.  BP Response: Normal blood pressure response.  Clinical Symptoms: No significant symptoms noted.  ECG Impression: No significant ST segment change suggestive of ischemia.  Comparison with Prior Nuclear Study: 2012  Overall Impression: Intermediate risk stress nuclear study . There is a small- medium sized area of ischemia at the apex. .  LV Ejection Fraction: 77%. LV Wall Motion: NL LV Function; NL Wall Motion.  Thayer Headings, Brooke Bonito., MD, [facility]  Echo: 08/29/14:Study Conclusions  - Left ventricle: The cavity size was normal. Systolic function was normal. The estimated  ejection fraction was in the range of 55% to 60%. Wall motion was normal; there were no regional wall motion abnormalities. Features are consistent with a pseudonormal left ventricular filling pattern, with concomitant abnormal relaxation and increased filling pressure (grade 2 diastolic dysfunction). - Aortic valve: Moderate thickening and calcification. There was moderate stenosis. Valve area: 1.13cm^2(VTI). Valve area: 1.12cm^2 (Vmax).  Echo 10/06/19: IMPRESSIONS    1. Left ventricular ejection fraction, by visual estimation, is 55 to 60%. The left ventricle has normal function. Normal left ventricular size. Left ventricular septal wall thickness was moderately increased. There is moderately increased left  ventricular hypertrophy.  2. Global right ventricle has normal systolic function.The right ventricular size is normal. No increase in right ventricular wall thickness.  3. Left atrial size was normal.  4. Right atrial size was normal.  5. Mild mitral annular calcification.  6. Moderate calcification of the mitral valve leaflet(s).  7. Moderate thickening of the mitral valve leaflet(s).  8. The mitral valve is normal in structure. Trace mitral valve regurgitation.  9. The tricuspid valve is normal in structure. Tricuspid valve regurgitation is mild. 10. The aortic valve has an indeterminant number of cusps Aortic valve regurgitation is mild by color flow Doppler. Severe aortic valve stenosis. 11. There is Severe calcifcation of the aortic valve. 12. There is Severely thickening of the aortic valve. 13. The pulmonic valve was grossly normal. Pulmonic valve regurgitation is mild by color flow Doppler  Cardiac cath 11/10/19:  RIGHT/LEFT HEART CATH AND CORONARY ANGIOGRAPHY  Conclusion    Prox LAD to Mid LAD  lesion is 99% stenosed.  Mid Cx to Dist Cx lesion is 50% stenosed.  Mid RCA lesion is 30% stenosed.  The left ventricular systolic function is normal.  LV end  diastolic pressure is normal.  The left ventricular ejection fraction is 55-65% by visual estimate.  There is severe aortic valve stenosis.   1. Critical single vessel obstructive CAD with 99% mid LAD - heavily calcified 2. Severe aortic stenosis. Mean gradient 47 mm Hg. AVA 0.68 cm squared with index 0.39. 3. Normal LV filling pressures 4. Normal Right heart pressures 5. Normal cardiac output.  Plan: referral to CT surgery for combined   Event monitor 11/30/19: Study Highlights   Normal sinus rhythm  Rare isolated PVCs and PACs   Echo: 12/13/19:IMPRESSIONS    1. Left ventricular ejection fraction, by visual estimation, is 70 to  75%. The left ventricle has normal function. There is no left ventricular  hypertrophy.  2. The left ventricle has no regional wall motion abnormalities.  3. Global right ventricle has normal systolic function.The right  ventricular size is normal. No increase in right ventricular wall  thickness.  4. Left atrial size was normal.  5. Right atrial size was normal.  6. The mitral valve is normal in structure. No evidence of mitral valve  regurgitation. No evidence of mitral stenosis.  7. The tricuspid valve is normal in structure. Tricuspid valve  regurgitation is trivial.  8. Aortic valve regurgitation is not visualized.  9. Aortic Valve Replacement-84mm Biosprosthetic Peak velocity 2.82m/s,  mean 50mmHg.  10. The pulmonic valve was normal in structure. Pulmonic valve  regurgitation is not visualized.  11. Mildly elevated pulmonary artery systolic pressure.  12. The inferior vena cava is normal in size with greater than 50%  respiratory variability, suggesting right atrial pressure of 3 mmHg.     ASSESSMENT AND PLAN:  1. Coronary disease. With severe LAD stenosis. S/p CABG with LIMA to the LAD.   Recommend continued aspirin, amlodipine, and statin therapy. On ASA 325 mg for 3 months then reduce to 81 mg daily. Goal LDL <70.   2.  Hypertension-control is OK.   3. Severe  aortic stenosis.  Progressive by Echo. S/p tissue AVR. No significant murmur on exam. Progressing well post op. Repeat Echo in December showed normal functioning prosthesis.  Instructed on SBE prophylaxis.  4. PAD with chronic claudication. Dopplers reveal severe aortoiliac disease and more distal disease. Will need referral to [name] once healed from heart surgery. Will follow up with me in 2 months.   5. Tobacco abuse. Have strongly encouraged smoking cessation. Encourage him to follow thru with smoking cessation program  6. Hyperlipidemia. High risk patient. LDL goal < 70. Now on high dose lipitor. Repeat labs in 2 months.  7. Right bundle branch block with -chronic  8. Bipolar disorder.  9. Remote history of Etoh abuse- sober for years.   10. Complete heart block post op. Resolved. Avoid AV nodal blocking agents. Event monitor showed no AV block.   Current medicines are reviewed at length with the patient today.  The patient does not have concerns regarding medicines.  The following changes have been made:  See above  Labs/ tests ordered today include:   No orders of the defined types were placed in this encounter.    Disposition:   FU 2 months   Signed, Haydyn Girvan [name], MD  02/12/2020 11:06 AM    [facility] [address] [place], [place], [address] Phone  [phone], Fax [phone]

## 2020-02-13 ENCOUNTER — Telehealth (HOSPITAL_COMMUNITY): Payer: Self-pay

## 2020-02-13 ENCOUNTER — Encounter (HOSPITAL_COMMUNITY): Payer: PPO

## 2020-02-13 ENCOUNTER — Telehealth (HOSPITAL_COMMUNITY): Payer: Self-pay | Admitting: Family Medicine

## 2020-02-13 NOTE — Telephone Encounter (Signed)
Cardiac Rehab Note:  Successful telephone encounter to Mr. Gregory [name] in response to his call out to CR exercise today. Per Mr. Gregory [name], he developed "cold symptoms" on Saturday with a "productive cough" and congestion. C/O sneezing, nasal congestion, and body "sorness" but relates the body aches to CR exercise on Friday. He denies fever, chills, N/V/D. Mr. Gregory [name] is encouraged to contact his PCP and/or get Covid-19 tested. He is also encouraged to contact his cardiologist to inform of symptoms as he has a face to face visit tomorrow. Mr. Gregory [name] is instructed to not present to CR exercise until next Friday if he chooses to not be tested. Mr. Gregory [name] understanding of all instructions provided.  Gregory [name] E. Gregory Rotunda RN, BSN [place]. [facility]  Cardiac and Pulmonary Rehabilitation [place] Direct: [phone]

## 2020-02-14 ENCOUNTER — Ambulatory Visit: Payer: PPO | Admitting: Cardiology

## 2020-02-14 ENCOUNTER — Telehealth: Payer: Self-pay | Admitting: Cardiology

## 2020-02-14 ENCOUNTER — Other Ambulatory Visit: Payer: PPO

## 2020-02-14 NOTE — Telephone Encounter (Signed)
Patient calling to speak with Malachy Mood, Dr. Doug Sou nurse. He states he has symptoms of a cold, no fever though. He has an appt with Dr. [name] today at 10:20am. He states he thinks it is just allergies but wants to see what Malachy Mood thinks he should do before he reschedules his appt. Please advise.

## 2020-02-14 NOTE — Telephone Encounter (Signed)
Spoke to patient appointment for today with Dr.Jordan cancelled.Appointment rescheduled with Almyra Deforest PA 02/29/20 at 2:15 pm.

## 2020-02-14 NOTE — Telephone Encounter (Signed)
Please advise Malachy Mood.  Will check with MD and primary nurse.

## 2020-02-14 NOTE — Telephone Encounter (Signed)
Called patient no answer.Left message on personal voice mail we need to reschedule your appointment for today.Advised to call me back to reschedule.

## 2020-02-15 ENCOUNTER — Encounter (HOSPITAL_COMMUNITY): Payer: PPO

## 2020-02-15 ENCOUNTER — Ambulatory Visit: Payer: PPO | Attending: Internal Medicine

## 2020-02-15 DIAGNOSIS — Z20822 Contact with and (suspected) exposure to covid-19: Secondary | ICD-10-CM

## 2020-02-16 ENCOUNTER — Telehealth (HOSPITAL_COMMUNITY): Payer: Self-pay

## 2020-02-16 LAB — NOVEL CORONAVIRUS, NAA: SARS-CoV-2, NAA: NOT DETECTED

## 2020-02-16 NOTE — Telephone Encounter (Signed)
Cardiac Rehab Note:  Successful telephone call to patient to follow up on "bad cold" and discuss negative covid-19 test results. Gregory [name] states he does not feel well enough to return to CR exercise tomorrow. He believes his cold symptoms have progressed to a sinus infection. Has plans to contact PCP for virtual visit tomorrow. He is cleared to return to CR exercise on Monday 2/29/21 if symptoms have resolved.  Gregory [name] E. Rollene Rotunda RN, BSN [place]. [facility]  Cardiac and Pulmonary Rehabilitation [place] Direct: [phone]

## 2020-02-17 ENCOUNTER — Encounter (HOSPITAL_COMMUNITY): Payer: PPO

## 2020-02-20 ENCOUNTER — Other Ambulatory Visit: Payer: Self-pay

## 2020-02-20 ENCOUNTER — Encounter (HOSPITAL_COMMUNITY)
Admission: RE | Admit: 2020-02-20 | Discharge: 2020-02-20 | Disposition: A | Discharge status: Home / Self Care | Payer: PPO | Source: Ambulatory Visit | Attending: Cardiology | Admitting: Cardiology

## 2020-02-20 DIAGNOSIS — Z953 Presence of xenogenic heart valve: Secondary | ICD-10-CM | POA: Diagnosis not present

## 2020-02-20 DIAGNOSIS — Z951 Presence of aortocoronary bypass graft: Secondary | ICD-10-CM | POA: Diagnosis not present

## 2020-02-22 ENCOUNTER — Encounter (HOSPITAL_COMMUNITY)
Admission: RE | Admit: 2020-02-22 | Discharge: 2020-02-22 | Disposition: A | Discharge status: Home / Self Care | Payer: PPO | Source: Ambulatory Visit | Attending: Cardiology | Admitting: Cardiology

## 2020-02-22 ENCOUNTER — Other Ambulatory Visit: Payer: Self-pay

## 2020-02-22 VITALS — Ht 64.75 in | Wt 140.0 lb

## 2020-02-22 DIAGNOSIS — Z951 Presence of aortocoronary bypass graft: Secondary | ICD-10-CM

## 2020-02-22 DIAGNOSIS — Z953 Presence of xenogenic heart valve: Secondary | ICD-10-CM | POA: Diagnosis not present

## 2020-02-22 NOTE — Progress Notes (Signed)
Gregory [name] 69 y.o. male Nutrition Note  Visit Diagnosis: S/P aortic valve replacement with bioprosthetic valve  S/P CABG x 1  Past Medical History:  Diagnosis Date  . Alcohol abuse   . Allergy   . Aortic stenosis   . Arthritis   . Asthma   . Bipolar disorder ([place])   . CAD (coronary artery disease)    coronary calcifications 08/2013 CTA  . Carotid artery occlusion   . Chronic back pain   . Claudication ([place])   . Depression   . Family history of skin cancer   . Heart murmur   . Hepatitis    h/o 1970,doesn't remember which type,1990 epstain-barr  . Hyperlipidemia   . Hypertension   . Neuromuscular disorder ([place])   . Obsessive compulsive disorder   . PAD (peripheral artery disease) ([place])   . Peripheral neuropathy   . Pneumonia   . RBBB   . 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 Severe aortic stenosis   . Sleep apnea    does not wear CPAP  . Smoker   . Spinal stenosis at L4-L5 level   . Tobacco abuse   . Tobacco use disorder      Medications reviewed.   Current Outpatient Medications:  .  acetaminophen (TYLENOL) 325 MG tablet, Take 2 tablets (650 mg total) by mouth every 6 (six) hours as needed for mild pain., Disp:  , Rfl:  .  albuterol (PROVENTIL HFA;VENTOLIN HFA) 108 (90 Base) MCG/ACT inhaler, Inhale 2 puffs into the lungs every 6 (six) hours as needed for wheezing., Disp: 3 Inhaler, Rfl: 1 .  amLODipine (NORVASC) 10 MG tablet, TAKE ONE TABLET BY MOUTH DAILY, Disp: 90 tablet, Rfl: 1 .  amoxicillin (AMOXIL) 500 MG capsule, Take four capsules one hour before dental appointment., Disp: 4 capsule, Rfl: 1 .  aspirin EC 325 MG EC tablet, Take 1 tablet (325 mg total) by mouth daily., Disp: 30 tablet, Rfl: 0 .  atorvastatin (LIPITOR) 80 MG tablet, Take 1 tablet (80 mg total) by mouth daily., Disp: 90 tablet, Rfl: 3 .  divalproex (DEPAKOTE) 500 MG DR tablet, Take 500 mg by mouth at bedtime. 2-3 tabs, Disp: , Rfl:  .  finasteride  (PROSCAR) 5 MG tablet, TAKE ONE TABLET BY MOUTH DAILY, Disp: 90 tablet, Rfl: 2 .  fluticasone (FLONASE) 50 MCG/ACT nasal spray, SPRAY TWO SPRAYS IN THE AFFECTED NOSTRIL DAILY, Disp: 16 g, Rfl: 11 .  Fluticasone-Salmeterol (ADVAIR DISKUS) 500-50 MCG/DOSE AEPB, Inhale 1 puff into the lungs every 12 (twelve) hours., Disp: 180 each, Rfl: 3 .  lamoTRIgine (LAMICTAL) 200 MG tablet, Take 1 tablet (200 mg total) by mouth every morning., Disp: 90 tablet, Rfl: 3 .  lisinopril-hydrochlorothiazide (ZESTORETIC) 20-12.5 MG tablet, TAKE ONE TABLET BY MOUTH EVERY MORNING, Disp: 90 tablet, Rfl: 2 .  metoprolol tartrate (LOPRESSOR) 25 MG tablet, , Disp: , Rfl:  .  Multiple Vitamins-Minerals (MULTIVITAMIN ADULTS 50+ PO), Take 1 tablet by mouth daily. , Disp: , Rfl:   Current Facility-Administered Medications:  .  sodium chloride flush (NS) 0.9 % injection 3 mL, 3 mL, Intravenous, Q12H, [name], [name], MD   Ht Readings from Last 1 Encounters:  02/08/20 5' 4.75" (1.645 m)     Wt Readings from Last 3 Encounters:  02/08/20 138 lb 3.7 oz (62.7 kg)  01/10/20 138 lb 3.7 oz (62.7 kg)  01/04/20 136 lb 6.4 oz (61.9 kg)     There is no height  or weight on file to calculate BMI.   Social History   Tobacco Use  Smoking Status Current Every Day Smoker  . Packs/day: 1.00  . Years: 25.00  . Pack years: 25.00  . Types: Cigarettes  Smokeless Tobacco Never Used  Tobacco Comment   Pt had stopped smoking X10 years/ currently smoking a pack a day      Lab Results  Component Value Date   CHOL 122 02/10/2020   Lab Results  Component Value Date   HDL 38 (L) 02/10/2020   Lab Results  Component Value Date   LDLCALC 63 02/10/2020   Lab Results  Component Value Date   TRIG 116 02/10/2020   Lab Results  Component Value Date   CHOLHDL 3.2 02/10/2020     Lab Results  Component Value Date   HGBA1C 5.9 (H) 11/22/2019     CBG (last 3)  No results for input(s): GLUCAP in the last 72 hours.   Nutrition  Note  Spoke with pt. Nutrition Plan and Nutrition Survey goals reviewed with pt. Pt is following a Heart Healthy diet.   Pt has Pre-diabetes. Last A1c indicates blood glucose well-controlled.    Per discussion, pt does not use canned/convenience foods often. Pt does not add salt to food. Pt does not eat out frequently.   John and his wife cook at home most of the time. He incorporates veggies, fruit, and whole grains daily. He chooses lean meats most often.  They shop at [employer].  Good cooking skills and confident in the kitchen.  Goals to learn more about prediabetes and how to decrease A1C. Pt expressed understanding of the information reviewed.    Nutrition Diagnosis ? Food-and nutrition-related knowledge deficit related to lack of exposure to information as related to diagnosis of: ? CVD ? Pre-diabetes  Nutrition Intervention ? Pt's individual nutrition plan reviewed with pt. ? Benefits of adopting Heart Healthy diet discussed when Medficts reviewed.   ? Continue client-centered nutrition education by RD, as part of interdisciplinary care.  Goal(s) ? Pt to read labels for total carbohydrates and limit <75 g for meals ? Pt to build a healthy plate including vegetables, fruits, whole grains, and low-fat dairy products in a heart healthy meal plan.  Plan:   Will provide client-centered nutrition education as part of interdisciplinary care  Monitor and evaluate progress toward nutrition goal with team.   Michaele Offer, MS, RDN, LDN

## 2020-02-24 ENCOUNTER — Encounter (HOSPITAL_COMMUNITY): Payer: PPO

## 2020-02-24 ENCOUNTER — Ambulatory Visit: Payer: PPO | Attending: Internal Medicine

## 2020-02-24 DIAGNOSIS — Z23 Encounter for immunization: Secondary | ICD-10-CM

## 2020-02-24 NOTE — Progress Notes (Addendum)
   Covid-19 Vaccination Clinic  Name:  Gregory [name]    MRN: [id number] DOB: 04/13/1951  02/24/2020  [name] was observed post Covid-19 immunization for 30 minutes based on pre-vaccination screening without incident. He was provided with Vaccine Information Sheet and instruction to access the V-Safe system.   [name] was instructed to call 911 with any severe reactions post vaccine: [name] Difficulty breathing  . Swelling of face and throat  . A fast heartbeat  . A bad rash all over body  . Dizziness and weakness   Immunizations Administered    Name Date Dose VIS Date Route   Pfizer COVID-19 Vaccine 02/24/2020  9:34 AM 0.3 mL 12/02/2019 Intramuscular   Manufacturer: [place]   Lot: [id number]   [place]: [id number]

## 2020-02-27 ENCOUNTER — Other Ambulatory Visit: Payer: Self-pay

## 2020-02-27 ENCOUNTER — Encounter (HOSPITAL_COMMUNITY)
Admission: RE | Admit: 2020-02-27 | Discharge: 2020-02-27 | Disposition: A | Discharge status: Home / Self Care | Payer: PPO | Source: Ambulatory Visit | Attending: Cardiology | Admitting: Cardiology

## 2020-02-27 DIAGNOSIS — Z953 Presence of xenogenic heart valve: Secondary | ICD-10-CM

## 2020-02-27 DIAGNOSIS — Z951 Presence of aortocoronary bypass graft: Secondary | ICD-10-CM

## 2020-02-28 NOTE — Progress Notes (Signed)
I have reviewed a Home Exercise Prescription with Gregory [name] . [name] is not currently exercising at home due to back pain.  The patient was advised to walk 2-4 days a week for 30 minutes.  Thierry and I discussed how to progress their exercise prescription.  The patient stated that their goals were to start walking at home as much as he can when his back is feeling okay.  The patient stated that they understand the exercise prescription.  We reviewed exercise guidelines, target heart rate during exercise, RPE Scale, weather conditions, NTG use, endpoints for exercise, warmup and cool down.  Patient is encouraged to come to me with any questions. I will continue to follow up with the patient to assist them with progression and safety.    Deitra Mayo BS, ACSM CEP 02/27/2020 0900

## 2020-02-29 ENCOUNTER — Encounter (HOSPITAL_COMMUNITY)
Admission: RE | Admit: 2020-02-29 | Discharge: 2020-02-29 | Disposition: A | Discharge status: Home / Self Care | Payer: PPO | Source: Ambulatory Visit | Attending: Cardiology | Admitting: Cardiology

## 2020-02-29 ENCOUNTER — Encounter: Payer: Self-pay | Admitting: Physician Assistant

## 2020-02-29 ENCOUNTER — Other Ambulatory Visit: Payer: Self-pay

## 2020-02-29 ENCOUNTER — Ambulatory Visit (INDEPENDENT_AMBULATORY_CARE_PROVIDER_SITE_OTHER): Payer: PPO | Admitting: Physician Assistant

## 2020-02-29 VITALS — BP 118/62 | HR 67 | Temp 96.8°F | Ht 65.0 in | Wt 142.6 lb

## 2020-02-29 DIAGNOSIS — I442 Atrioventricular block, complete: Secondary | ICD-10-CM

## 2020-02-29 DIAGNOSIS — E785 Hyperlipidemia, unspecified: Secondary | ICD-10-CM

## 2020-02-29 DIAGNOSIS — I25810 Atherosclerosis of coronary artery bypass graft(s) without angina pectoris: Secondary | ICD-10-CM | POA: Diagnosis not present

## 2020-02-29 DIAGNOSIS — I739 Peripheral vascular disease, unspecified: Secondary | ICD-10-CM

## 2020-02-29 DIAGNOSIS — Z953 Presence of xenogenic heart valve: Secondary | ICD-10-CM

## 2020-02-29 DIAGNOSIS — I10 Essential (primary) hypertension: Secondary | ICD-10-CM | POA: Diagnosis not present

## 2020-02-29 DIAGNOSIS — Z951 Presence of aortocoronary bypass graft: Secondary | ICD-10-CM

## 2020-02-29 DIAGNOSIS — Z952 Presence of prosthetic heart valve: Secondary | ICD-10-CM | POA: Diagnosis not present

## 2020-02-29 NOTE — Progress Notes (Signed)
Cardiology Office Note:    Date:  03/02/2020   ID:  Gregory [name], DOB [birth year], MRN [id number]  PCP:  [name], MD  Cardiologist:  Peter [name], MD  Electrophysiologist:  None   Referring MD: [name], MD   Chief Complaint  Patient presents with  . Follow-up    seen for Dr. [name]    History of Present Illness:    Gregory [name] is a 69 y.o. male with a hx of bipolar disorder, carotid artery disease, PAD, HTN, HLD, tobacco abuse, history of aortic stenosis s/p AVR and CABG.  Previous CT of chest in 2014 showed extensive coronary calcification.  Myoview in October 2014 showed a small area of apical ischemia.  This is unchanged when compared to 2012 Myoview and since patient was asymptomatic, he was treated medically.  Due to progressively worsening aortic stenosis, patient eventually underwent cardiac catheterization on 11/10/2019 which revealed 99% proximal to mid LAD lesion, 50% mid left circumflex lesion, 30% mid RCA lesion, EF 55 to 65%.  Severe aortic stenosis was noted during cardiac catheterization as well.  Patient was referred to CT surgery and eventually underwent combined AVR (23 mm Edwards Inspiris Resilia pericardial valve) and CABG (LIMA-LAD) by Dr. Cyndia Bent.  Early postop course was complicated by a complete heart block, this eventually resolved.  EP service recommended continue monitoring.  All AV nodal blocking agents were held.  He was last seen by Dr. [name] in December 2020 at which time he was doing well.  He does have chronic claudication symptom and the plan is to refer him to Dr. Gwenlyn Found once he is healed up from cardiac perspective.  A repeat echocardiogram was completed on 12/13/2019 that revealed EF 70 to 75%, stable bioprosthetic aortic valve.  Zio patch monitor performed in December 2020 showed no significant arrhythmia or AV block.  Patient presents today for cardiology office visit.  He denies any obvious anginal symptom, he continue to have  some degree of chest soreness from the sternotomy scar.  Otherwise he has no lower extremity edema, orthopnea or PND.  He is trying to build up his exercise level.  He has not experienced any dizzy spell since leaving the hospital.  Recent heart monitor was reassuring.  He should continue to avoid any AV nodal blocking agent.  I do plan to refer the patient to Dr. Alvester Chou for evaluation of peripheral arterial disease.  Past Medical History:  Diagnosis Date  . Alcohol abuse   . Allergy   . Aortic stenosis   . Arthritis   . Asthma   . Bipolar disorder ([place])   . CAD (coronary artery disease)    coronary calcifications 08/2013 CTA  . Carotid artery occlusion   . Chronic back pain   . Claudication ([place])   . Depression   . Family history of skin cancer   . Heart murmur   . Hepatitis    h/o 1970,doesn't remember which type,1990 epstain-barr  . Hyperlipidemia   . Hypertension   . Neuromuscular disorder ([place])   . Obsessive compulsive disorder   . PAD (peripheral artery disease) ([place])   . Peripheral neuropathy   . Pneumonia   . RBBB   . 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 Severe aortic stenosis   . Sleep apnea    does not wear CPAP  . Smoker   . Spinal stenosis at L4-L5 level   . Tobacco abuse   . Tobacco  use disorder     Past Surgical History:  Procedure Laterality Date  . ANAL FISTULECTOMY  09/25/11  . AORTIC VALVE REPLACEMENT N/A 11/24/2019   Procedure: AORTIC VALVE REPLACEMENT (AVR) using INSPIRIS Resilia 23 MM Bioprosthetic Aortic Valve.;  Surgeon: Gaye Pollack, MD;  Location: [facility] OR;  Service: Open Heart Surgery;  Laterality: N/A;  . COLONOSCOPY  2009  . CORONARY ARTERY BYPASS GRAFT N/A 11/24/2019   Procedure: CORONARY ARTERY BYPASS GRAFTING (CABG) using LIMA to LAD.;  Surgeon: Gaye Pollack, MD;  Location: [facility] OR;  Service: Open Heart Surgery;  Laterality: N/A;  . ELBOW SURGERY     bilaterally for cubital tunnel  . HERNIA REPAIR     with  mesh  . MAXIMUM ACCESS (MAS)POSTERIOR LUMBAR INTERBODY FUSION (PLIF) 1 LEVEL N/A 10/25/2014   Procedure: LUMBAR FOUR TO FIVE MAXIMUM ACCESS (MAS) POSTERIOR LUMBAR INTERBODY FUSION (PLIF) 1 LEVEL;  Surgeon: Eustace Moore, MD;  Location: [facility];  Service: Neurosurgery;  Laterality: N/A;  . RIGHT/LEFT HEART CATH AND CORONARY ANGIOGRAPHY N/A 11/10/2019   Procedure: RIGHT/LEFT HEART CATH AND CORONARY ANGIOGRAPHY;  Surgeon: [name], [name], MD;  Location: [facility];  Service: Cardiovascular;  Laterality: N/A;  . 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 TEE WITHOUT CARDIOVERSION N/A 11/24/2019   Procedure: TRANSESOPHAGEAL ECHOCARDIOGRAM (TEE);  Surgeon: Gaye Pollack, MD;  Location: [facility];  Service: Open Heart Surgery;  Laterality: N/A;  . TONSILLECTOMY  age 42  . VASECTOMY     X 2  . VASECTOMY REVERSAL      Current Medications: Current Meds  Medication Sig  . acetaminophen (TYLENOL) 325 MG tablet Take 2 tablets (650 mg total) by mouth every 6 (six) hours as needed for mild pain.  [name] albuterol (PROVENTIL HFA;VENTOLIN HFA) 108 (90 Base) MCG/ACT inhaler Inhale 2 puffs into the lungs every 6 (six) hours as needed for wheezing.  [name] amLODipine (NORVASC) 10 MG tablet TAKE ONE TABLET BY MOUTH DAILY  . amoxicillin (AMOXIL) 500 MG capsule Take four capsules one hour before dental appointment.  [name] aspirin EC 325 MG EC tablet Take 1 tablet (325 mg total) by mouth daily.  [name] atorvastatin (LIPITOR) 80 MG tablet Take 1 tablet (80 mg total) by mouth daily.  . divalproex (DEPAKOTE) 500 MG DR tablet Take 500 mg by mouth at bedtime. 2-3 tabs  . finasteride (PROSCAR) 5 MG tablet TAKE ONE TABLET BY MOUTH DAILY  . fluticasone (FLONASE) 50 MCG/ACT nasal spray SPRAY TWO SPRAYS IN THE AFFECTED NOSTRIL DAILY  . Fluticasone-Salmeterol (ADVAIR DISKUS) 500-50 MCG/DOSE AEPB Inhale 1 puff into the lungs every 12 (twelve) hours.  [name] lamoTRIgine  (LAMICTAL) 200 MG tablet Take 1 tablet (200 mg total) by mouth every morning.  [name] lisinopril-hydrochlorothiazide (ZESTORETIC) 20-12.5 MG tablet TAKE ONE TABLET BY MOUTH EVERY MORNING  . Multiple Vitamins-Minerals (MULTIVITAMIN ADULTS 50+ PO) Take 1 tablet by mouth daily.    Current Facility-Administered Medications for the 02/29/20 encounter (Office Visit) with Almyra Deforest, PA  Medication  . sodium chloride flush (NS) 0.9 % injection 3 mL     Allergies:   Codeine   Social History   Socioeconomic History  . Marital status: Married    Spouse name: Not on file  . Number of children: 6  . Years of education: Not on file  . Highest education level: Not on file  Occupational History  . Not on file  Tobacco Use  .  Smoking status: Current Every Day Smoker    Packs/day: 1.00    Years: 25.00    Pack years: 25.00    Types: Cigarettes  . Smokeless tobacco: Never Used  . Tobacco comment: Pt had stopped smoking X10 years/ currently smoking a pack a day   Substance and Sexual Activity  . Alcohol use: Yes    Alcohol/week: 78.0 standard drinks    Types: 50 Glasses of wine, 28 Standard drinks or equivalent per week    Comment: None for the past 5 years. 11/14/19  . Drug use: No    Comment: former alcoholic  . Sexual activity: Yes  Other Topics Concern  . Not on file  Social History Narrative   Lives in [place] with wife.  Does not routinely exercise.  Sedentary r/t chronic lbp.   Social Determinants of Health   Financial Resource Strain:   . Difficulty of Paying Living Expenses:   Food Insecurity:   . Worried About Charity fundraiser in the Last Year:   . Arboriculturist in the Last Year:   Transportation Needs:   . Film/video editor (Medical):   [name] Lack of Transportation (Non-Medical):   Physical Activity:   . Days of Exercise per Week:   . Minutes of Exercise per Session:   Stress:   . Feeling of Stress :   Social Connections:   . Frequency of Communication with Friends and  Family:   . Frequency of Social Gatherings with Friends and Family:   . Attends Religious Services:   . Active Member of Clubs or Organizations:   . Attends Archivist Meetings:   [name] Marital Status:      Family History: The patient's family history includes Cancer in his father, maternal aunt, and maternal grandmother; Diabetes in his brother; Drug abuse in his brother and father; Heart disease in his brother; Heart failure in his mother; Hypertension in his brother; Stroke (age of onset: 64) in his father.  ROS:   Please see the history of present illness.     All other systems reviewed and are negative.  EKGs/Labs/Other Studies Reviewed:    The following studies were reviewed today:  Cath 11/10/2019  Prox LAD to Mid LAD lesion is 99% stenosed.  Mid Cx to Dist Cx lesion is 50% stenosed.  Mid RCA lesion is 30% stenosed.  The left ventricular systolic function is normal.  LV end diastolic pressure is normal.  The left ventricular ejection fraction is 55-65% by visual estimate.  There is severe aortic valve stenosis.   1. Critical single vessel obstructive CAD with 99% mid LAD - heavily calcified 2. Severe aortic stenosis. Mean gradient 47 mm Hg. AVA 0.68 cm squared with index 0.39. 3. Normal LV filling pressures 4. Normal Right heart pressures 5. Normal cardiac output.  Plan: referral to CT surgery for combined AVR and CABG.   EKG:  EKG is not ordered today.   Recent Labs: 11/25/2019: Magnesium 2.7 11/28/2019: Hemoglobin 9.2; Platelets 123 02/10/2020: ALT 28; BUN 13; Creatinine, Ser 0.90; Potassium 5.3; Sodium 140  Recent Lipid Panel    Component Value Date/Time   CHOL 122 02/10/2020 1109   TRIG 116 02/10/2020 1109   HDL 38 (L) 02/10/2020 1109   CHOLHDL 3.2 02/10/2020 1109   CHOLHDL 5.1 (H) 06/29/2017 1534   VLDL 45 (H) 06/29/2017 1534   LDLCALC 63 02/10/2020 1109    Physical Exam:    VS:  BP 118/62   Pulse 67  Temp (!) 96.8 F (36 C)   Ht 5'  5" (1.651 m)   Wt 142 lb 9.6 oz (64.7 kg)   SpO2 96%   BMI 23.73 kg/m     Wt Readings from Last 3 Encounters:  02/29/20 142 lb 9.6 oz (64.7 kg)  02/22/20 140 lb (63.5 kg)  02/08/20 138 lb 3.7 oz (62.7 kg)     GEN:  Well nourished, well developed in no acute distress HEENT: Normal NECK: No JVD; No carotid bruits LYMPHATICS: No lymphadenopathy CARDIAC: RRR, no murmurs, rubs, gallops RESPIRATORY:  Clear to auscultation without rales, wheezing or rhonchi  ABDOMEN: Soft, non-tender, non-distended MUSCULOSKELETAL:  No edema; No deformity  SKIN: Warm and dry NEUROLOGIC:  Alert and oriented x 3 PSYCHIATRIC:  Normal affect   ASSESSMENT:    1. Coronary artery disease involving coronary bypass graft of native heart without angina pectoris   2. S/P AVR   3. Essential hypertension   4. Hyperlipidemia LDL goal <70   5. PAD (peripheral artery disease) ([place])   6. Heart block AV complete (H[facility])    PLAN:    In order of problems listed above:  1. CAD s/p CABG: He continued to have some soreness from the sternotomy scar however no exertional angina symptom  2. History of AVR: Denies any shortness of breath, lower extremity edema, orthopnea or PND.  Minimal heart murmur on physical exam  3. Hypertension: Blood pressure stable on current therapy.  Avoid AV nodal blocking agent  4. Hyperlipidemia: Continue on statin therapy  5. PAD with chronic claudication symptoms: He describes bilateral hip pain that is worse with physical activity.  Will refer to Dr. Gwenlyn Found  6. History of complete heart block: Occurred in the postop setting after the bypass surgery and AVR.  Avoid AV nodal blocking agent.  He has not had any further issue.  Recent heart monitor did not show any significant pauses.   Medication Adjustments/Labs and Tests Ordered: Current medicines are reviewed at length with the patient today.  Concerns regarding medicines are outlined above.  No orders of the defined types were placed  in this encounter.  No orders of the defined types were placed in this encounter.   Patient Instructions  Medication Instructions:  Your physician recommends that you continue on your current medications as directed. Please refer to the Current Medication list given to you today. *If you need a refill on your cardiac medications before your next appointment, please call your pharmacy*  Lab Work: NONE ordered at this time of appointment   If you have labs (blood work) drawn today and your tests are completely normal, you will receive your results only by: [name] MyChart Message (if you have MyChart) OR . A paper copy in the mail If you have any lab test that is abnormal or we need to change your treatment, we will call you to review the results.  Testing/Procedures: NONE ordered at this time of appointment   Follow-Up: At [facility], you and your health needs are our priority.  As part of our continuing mission to provide you with exceptional heart care, we have created designated Provider Care Teams.  These Care Teams include your primary Cardiologist (physician) and Advanced Practice Providers (APPs -  Physician Assistants and Nurse Practitioners) who all work together to provide you with the care you need, when you need it.  Your next appointment:   4-6 month(s)  The format for your next appointment:   In Person  Provider:  Peter [name], MD  Other Instructions  You will need an appointment with Lorretta Harp, MD for PAD evaluation in 1 month      Signed, Almyra Deforest, [place]  03/02/2020 11:53 PM    [place]

## 2020-02-29 NOTE — Patient Instructions (Signed)
Medication Instructions:  Your physician recommends that you continue on your current medications as directed. Please refer to the Current Medication list given to you today. *If you need a refill on your cardiac medications before your next appointment, please call your pharmacy*  Lab Work: NONE ordered at this time of appointment   If you have labs (blood work) drawn today and your tests are completely normal, you will receive your results only by: [name] MyChart Message (if you have MyChart) OR . A paper copy in the mail If you have any lab test that is abnormal or we need to change your treatment, we will call you to review the results.  Testing/Procedures: NONE ordered at this time of appointment   Follow-Up: At [facility], you and your health needs are our priority.  As part of our continuing mission to provide you with exceptional heart care, we have created designated Provider Care Teams.  These Care Teams include your primary Cardiologist (physician) and Advanced Practice Providers (APPs -  Physician Assistants and Nurse Practitioners) who all work together to provide you with the care you need, when you need it.  Your next appointment:   4-6 month(s)  The format for your next appointment:   In Person  Provider:   Peter [name], MD  Other Instructions  You will need an appointment with Lorretta Harp, MD for PAD evaluation in 1 month

## 2020-03-01 NOTE — Progress Notes (Signed)
Cardiac Individual Treatment Plan  Patient Details  Name: BRANDTLY DOIRON MRN: [id number] Date of Birth: 1950/12/31 Referring Provider:     [place] from 01/10/2020 in [place]  Referring Provider  [name], Peter, MD      Initial Encounter Date:    CARDIAC REHAB PHASE II ORIENTATION from 01/10/2020 in [place]  Date  01/10/20      Visit Diagnosis: S/P aortic valve replacement with bioprosthetic valve  S/P CABG x 1  Patient's Home Medications on Admission:  Current Outpatient Medications:  .  acetaminophen (TYLENOL) 325 MG tablet, Take 2 tablets (650 mg total) by mouth every 6 (six) hours as needed for mild pain., Disp:  , Rfl:  .  albuterol (PROVENTIL HFA;VENTOLIN HFA) 108 (90 Base) MCG/ACT inhaler, Inhale 2 puffs into the lungs every 6 (six) hours as needed for wheezing., Disp: 3 Inhaler, Rfl: 1 .  amLODipine (NORVASC) 10 MG tablet, TAKE ONE TABLET BY MOUTH DAILY, Disp: 90 tablet, Rfl: 1 .  amoxicillin (AMOXIL) 500 MG capsule, Take four capsules one hour before dental appointment., Disp: 4 capsule, Rfl: 1 .  aspirin EC 325 MG EC tablet, Take 1 tablet (325 mg total) by mouth daily., Disp: 30 tablet, Rfl: 0 .  atorvastatin (LIPITOR) 80 MG tablet, Take 1 tablet (80 mg total) by mouth daily., Disp: 90 tablet, Rfl: 3 .  divalproex (DEPAKOTE) 500 MG DR tablet, Take 500 mg by mouth at bedtime. 2-3 tabs, Disp: , Rfl:  .  finasteride (PROSCAR) 5 MG tablet, TAKE ONE TABLET BY MOUTH DAILY, Disp: 90 tablet, Rfl: 2 .  fluticasone (FLONASE) 50 MCG/ACT nasal spray, SPRAY TWO SPRAYS IN THE AFFECTED NOSTRIL DAILY, Disp: 16 g, Rfl: 11 .  Fluticasone-Salmeterol (ADVAIR DISKUS) 500-50 MCG/DOSE AEPB, Inhale 1 puff into the lungs every 12 (twelve) hours., Disp: 180 each, Rfl: 3 .  lamoTRIgine (LAMICTAL) 200 MG tablet, Take 1 tablet (200 mg total) by mouth every morning., Disp: 90 tablet, Rfl: 3 .   lisinopril-hydrochlorothiazide (ZESTORETIC) 20-12.5 MG tablet, TAKE ONE TABLET BY MOUTH EVERY MORNING, Disp: 90 tablet, Rfl: 2 .  metoprolol tartrate (LOPRESSOR) 25 MG tablet, , Disp: , Rfl:  .  Multiple Vitamins-Minerals (MULTIVITAMIN ADULTS 50+ PO), Take 1 tablet by mouth daily. , Disp: , Rfl:   Current Facility-Administered Medications:  .  sodium chloride flush (NS) 0.9 % injection 3 mL, 3 mL, Intravenous, Q12H, [name], [name], MD  Past Medical History: Past Medical History:  Diagnosis Date  . Alcohol abuse   . Allergy   . Aortic stenosis   . Arthritis   . Asthma   . Bipolar disorder ([place])   . CAD (coronary artery disease)    coronary calcifications 08/2013 CTA  . Carotid artery occlusion   . Chronic back pain   . Claudication ([place])   . Depression   . Family history of skin cancer   . Heart murmur   . Hepatitis    h/o 1970,doesn't remember which type,1990 epstain-barr  . Hyperlipidemia   . Hypertension   . Neuromuscular disorder ([place])   . Obsessive compulsive disorder   . PAD (peripheral artery disease) ([place])   . Peripheral neuropathy   . Pneumonia   . RBBB   . 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 Severe aortic stenosis   . Sleep apnea    does not wear CPAP  . Smoker   . Spinal  stenosis at L4-L5 level   . Tobacco abuse   . Tobacco use disorder     Tobacco Use: Social History   Tobacco Use  Smoking Status Current Every Day Smoker  . Packs/day: 1.00  . Years: 25.00  . Pack years: 25.00  . Types: Cigarettes  Smokeless Tobacco Never Used  Tobacco Comment   Pt had stopped smoking X10 years/ currently smoking a pack a day     Labs: Recent Review Flowsheet Data    Labs for ITP Cardiac and Pulmonary Rehab Latest Ref Rng & Units 11/24/2019 11/24/2019 11/24/2019 11/24/2019 02/10/2020   Cholestrol 100 - 199 mg/dL - - - - 122   LDLCALC 0 - 99 mg/dL - - - - 63   HDL >39 mg/dL - - - - 38(L)   Trlycerides 0 - 149 mg/dL - - - - 116    Hemoglobin A1c 4.8 - 5.6 % - - - - -   PHART 7.350 - 7.450 - 7.375 7.340(L) 7.349(L) -   PCO2ART 32.0 - 48.0 mmHg - 40.1 44.4 44.0 -   HCO3 20.0 - 28.0 mmol/L - 23.7 23.9 24.2 -   TCO2 22 - 32 mmol/L 28 25 25 26  -   ACIDBASEDEF 0.0 - 2.0 mmol/L - 2.0 2.0 1.0 -   O2SAT % - 99.0 99.0 99.0 -      Capillary Blood Glucose: Lab Results  Component Value Date   GLUCAP 92 11/28/2019   GLUCAP 133 (H) 11/27/2019   GLUCAP 109 (H) 11/27/2019   GLUCAP 122 (H) 11/27/2019   GLUCAP 91 11/27/2019     Exercise Target Goals: Exercise Program Goal: Individual exercise prescription set using results from initial 6 min walk test and THRR while considering  patient's activity barriers and safety.   Exercise Prescription Goal: Initial exercise prescription builds to 30-45 minutes a day of aerobic activity, 2-3 days per week.  Home exercise guidelines will be given to patient during program as part of exercise prescription that the participant will acknowledge.  Activity Barriers & Risk Stratification: Activity Barriers & Cardiac Risk Stratification - 01/10/20 1229      Activity Barriers & Cardiac Risk Stratification   Activity Barriers  Other (comment)    Comments  leg numbness    Cardiac Risk Stratification  High       6 Minute Walk: 6 Minute Walk    Row Name 01/10/20 0919         6 Minute Walk   Phase  Initial     Distance  1001 feet     Walk Time  6 minutes     # of Rest Breaks  1     MPH  1.9     METS  2.53     RPE  11     Perceived Dyspnea   0     VO2 Peak  8.85     Symptoms  Yes (comment)     Comments  Cramping in gluteus area.     Resting HR  64 bpm     Resting BP  114/50     Resting Oxygen Saturation   99 %     Exercise Oxygen Saturation  during 6 min walk  100 %     Max Ex. HR  83 bpm     Max Ex. BP  142/58     2 Minute Post BP  134/60        Oxygen Initial Assessment:   Oxygen Re-Evaluation:  Oxygen Discharge (Final Oxygen Re-Evaluation):   Initial  Exercise Prescription: Initial Exercise Prescription - 01/10/20 1200      Date of Initial Exercise RX and Referring Provider   Date  01/10/20    Referring Provider  [name], Peter, MD      Track   Minutes  30      Prescription Details   Frequency (times per week)  5-7    Duration  Progress to 30 minutes of continuous aerobic without signs/symptoms of physical distress      Intensity   THRR 40-80% of Max Heartrate  60-121    Ratings of Perceived Exertion  11-13    Perceived Dyspnea  0-4      Progression   Progression  Continue to progress workloads to maintain intensity without signs/symptoms of physical distress.      Resistance Training   Training Prescription  Yes    Reps  10-15       Perform Capillary Blood Glucose checks as needed.  Exercise Prescription Changes: Exercise Prescription Changes    Row Name 01/28/20 1355 02/08/20 1000 02/10/20 1200 02/27/20 1100       Response to Exercise   Blood Pressure (Admit)  --  112/52  102/60  130/62    Blood Pressure (Exercise)  --  146/70  134/68  148/70    Blood Pressure (Exit)  --  120/68  120/60  112/62    Heart Rate (Admit)  62 bpm  81 bpm  70 bpm  81 bpm    Heart Rate (Exercise)  --  106 bpm  80 bpm  96 bpm    Heart Rate (Exit)  --  81 bpm  69 bpm  88 bpm    Rating of Perceived Exertion (Exercise)  9  15  11  12     Symptoms  none  Lower extremity pain.   None  None    Comments  Virtual cardiac rehab  --  Change in exercise Rx.   --    Duration  Progress to 30 minutes of  aerobic without signs/symptoms of physical distress  Progress to 30 minutes of  aerobic without signs/symptoms of physical distress  Progress to 30 minutes of  aerobic without signs/symptoms of physical distress  Progress to 30 minutes of  aerobic without signs/symptoms of physical distress    Intensity  THRR unchanged  THRR unchanged  THRR unchanged  THRR unchanged      Progression   Progression  Continue to progress workloads to maintain intensity  without signs/symptoms of physical distress.  Continue to progress workloads to maintain intensity without signs/symptoms of physical distress.  Continue to progress workloads to maintain intensity without signs/symptoms of physical distress.  Continue to progress workloads to maintain intensity without signs/symptoms of physical distress.    Average METs  --  2.1  1.9  2.1      Resistance Training   Training Prescription  --  No  Yes  Yes    Weight  --  --  3 lbs.   3 lbs.     Reps  --  --  10-15  10-15    Time  --  --  10 Minutes  10 Minutes      Interval Training   Interval Training  No  No  No  No      Recumbant Bike   Level  --  2  2  2     Watts  --  --  4  4  Minutes  --  15  15  15     METs  --  2  2.5  3.2      NuStep   Level  --  2  --  --    SPM  --  85  --  --    Minutes  --  15  --  --    METs  --  2.1  --  --      Arm Ergometer   Level  --  --  2  2    Watts  --  --  4  4    Minutes  --  --  15  15    METs  --  --  1.4  1      Track   Minutes  15  --  --  --       Exercise Comments: Exercise Comments    Row Name 01/30/20 1406 02/08/20 1018 02/10/20 1206 02/27/20 0900     Exercise Comments  Patient will be contacted to begin participation in the onsite cardiac rehab program after temporary closure due to COVID-19 pandemic.  Pt first day of exercise. Pt tolerated exercise well but did have lower extremity pain.  Pt exercise Rx change. Pt tolerated new exercise Rx well with no lower extermity pain.  Reviewed HEP with Pt. Pt understands goals.       Exercise Goals and Review: Exercise Goals    Row Name 01/10/20 1230             Exercise Goals   Increase Physical Activity  Yes       Intervention  Provide advice, education, support and counseling about physical activity/exercise needs.;Develop an individualized exercise prescription for aerobic and resistive training based on initial evaluation findings, risk stratification, comorbidities and participant's  personal goals.       Expected Outcomes  Short Term: Attend rehab on a regular basis to increase amount of physical activity.;Long Term: Exercising regularly at least 3-5 days a week.;Long Term: Add in home exercise to make exercise part of routine and to increase amount of physical activity.       Increase Strength and Stamina  Yes       Intervention  Provide advice, education, support and counseling about physical activity/exercise needs.;Develop an individualized exercise prescription for aerobic and resistive training based on initial evaluation findings, risk stratification, comorbidities and participant's personal goals.       Expected Outcomes  Short Term: Increase workloads from initial exercise prescription for resistance, speed, and METs.;Short Term: Perform resistance training exercises routinely during rehab and add in resistance training at home;Long Term: Improve cardiorespiratory fitness, muscular endurance and strength as measured by increased METs and functional capacity (6MWT)       Able to understand and use rate of perceived exertion (RPE) scale  Yes       Intervention  Provide education and explanation on how to use RPE scale       Expected Outcomes  Short Term: Able to use RPE daily in rehab to express subjective intensity level;Long Term:  Able to use RPE to guide intensity level when exercising independently       Knowledge and understanding of Target Heart Rate Range (THRR)  Yes       Intervention  Provide education and explanation of THRR including how the numbers were predicted and where they are located for reference       Expected Outcomes  Short Term: Able to  state/look up THRR;Long Term: Able to use THRR to govern intensity when exercising independently;Short Term: Able to use daily as guideline for intensity in rehab       Able to check pulse independently  Yes       Intervention  Provide education and demonstration on how to check pulse in carotid and radial  arteries.;Review the importance of being able to check your own pulse for safety during independent exercise       Expected Outcomes  Short Term: Able to explain why pulse checking is important during independent exercise;Long Term: Able to check pulse independently and accurately       Understanding of Exercise Prescription  Yes       Intervention  Provide education, explanation, and written materials on patient's individual exercise prescription       Expected Outcomes  Short Term: Able to explain program exercise prescription;Long Term: Able to explain home exercise prescription to exercise independently          Exercise Goals Re-Evaluation : Exercise Goals Re-Evaluation    Row Name 01/30/20 1401 02/08/20 1017 02/10/20 1204 02/27/20 0901       Exercise Goal Re-Evaluation   Exercise Goals Review  --  Increase Physical Activity;Increase Strength and Stamina;Able to understand and use rate of perceived exertion (RPE) scale;Knowledge and understanding of Target Heart Rate Range (THRR);Understanding of Exercise Prescription  Increase Physical Activity;Increase Strength and Stamina;Able to understand and use rate of perceived exertion (RPE) scale;Knowledge and understanding of Target Heart Rate Range (THRR);Able to check pulse independently;Understanding of Exercise Prescription  Increase Physical Activity;Increase Strength and Stamina;Able to understand and use rate of perceived exertion (RPE) scale;Knowledge and understanding of Target Heart Rate Range (THRR);Able to check pulse independently;Understanding of Exercise Prescription    Comments  Patient has been actively participating in the virtual cardiac rehab program via the [facility] app and has been doing fairly well. Patient is walking 15-25 minutes as his mode of exercise.  Pt first day of exercise in CR program. Pt tolerated exercise well but did complain about having lower extremity pain on both stepper and rec bike. Will try these again  next session and alter exercise Rx if pain continues. Pt understands THRR, RPE scale, and exercise Rx.  Pt exercise Rx changed due to pain in lower extremities on first day of exercise. Pt is now using the arm crank and rec bike for 30 minutes. Pt tolerated todays exercise session well without lower extremity pain. Will continue with this this exercise Rx.  Reviewed HEP with Pt. Pt is enrolled in virtual CR program as well and has been using the app to log his at home exercise sessions. Pt stated he has been experiencing back pain and is going to consult his doctor in regards to this. Encouraged Pt to walk as much as he can without back pain. Pt stated this has been hindering his exercise. Pt understands goals and guidelines for exercising at home.    Expected Outcomes  Patient will transition to the onsite cardiac rehab program if interested  Will continue to monitor and progress Pt as tolerated.  Will continue to monitor and progress Pt as tolerated.  Will continue to monitor and progress Pt as tolerated.       Discharge Exercise Prescription (Final Exercise Prescription Changes): Exercise Prescription Changes - 02/27/20 1100      Response to Exercise   Blood Pressure (Admit)  130/62    Blood Pressure (Exercise)  148/70  Blood Pressure (Exit)  112/62    Heart Rate (Admit)  81 bpm    Heart Rate (Exercise)  96 bpm    Heart Rate (Exit)  88 bpm    Rating of Perceived Exertion (Exercise)  12    Symptoms  None    Duration  Progress to 30 minutes of  aerobic without signs/symptoms of physical distress    Intensity  THRR unchanged      Progression   Progression  Continue to progress workloads to maintain intensity without signs/symptoms of physical distress.    Average METs  2.1      Resistance Training   Training Prescription  Yes    Weight  3 lbs.     Reps  10-15    Time  10 Minutes      Interval Training   Interval Training  No      Recumbant Bike   Level  2    Watts  4    Minutes   15    METs  3.2      Arm Ergometer   Level  2    Watts  4    Minutes  15    METs  1       Nutrition:  Target Goals: Understanding of nutrition guidelines, daily intake of sodium 1500mg , cholesterol 200mg , calories 30% from fat and 7% or less from saturated fats, daily to have 5 or more servings of fruits and vegetables.  Biometrics: Pre Biometrics - 02/08/20 1257      Pre Biometrics   Height  5' 4.75" (1.645 m)    Weight  62.7 kg    Waist Circumference  36 inches    Hip Circumference  35 inches    Waist to Hip Ratio  1.03 %    BMI (Calculated)  23.17    Triceps Skinfold  15 mm    % Body Fat  24.4 %    Grip Strength  31.5 kg    Flexibility  11 in    Single Leg Stand  0 seconds        Nutrition Therapy Plan and Nutrition Goals: Nutrition Therapy & Goals - 02/22/20 1000      Nutrition Therapy   Diet  Heart Healthy      Personal Nutrition Goals   Nutrition Goal  Pt to read labels for total carbohydrates and limit <75 g for meals    Personal Goal #2  Pt to build a healthy plate including vegetables, fruits, whole grains, and low-fat dairy products in a heart healthy meal plan      Intervention Plan   Intervention  Prescribe, educate and counsel regarding individualized specific dietary modifications aiming towards targeted core components such as weight, hypertension, lipid management, diabetes, heart failure and other comorbidities.    Expected Outcomes  Short Term Goal: Understand basic principles of dietary content, such as calories, fat, sodium, cholesterol and nutrients.;Long Term Goal: Adherence to prescribed nutrition plan.       Nutrition Assessments: Nutrition Assessments - 02/22/20 0951      MEDFICTS Scores   Pre Score  36       Nutrition Goals Re-Evaluation: Nutrition Goals Re-Evaluation    Row Name 02/22/20 1000             Goals   Current Weight  140 lb (63.5 kg)       Expected Outcome  Limit carbs at meals to appropriate amount to lower  A1C out of prediabetes range.  Nutrition Goals Re-Evaluation: Nutrition Goals Re-Evaluation    [place] Name 02/22/20 1000             Goals   Current Weight  140 lb (63.5 kg)       Expected Outcome  Limit carbs at meals to appropriate amount to lower A1C out of prediabetes range.          Nutrition Goals Discharge (Final Nutrition Goals Re-Evaluation): Nutrition Goals Re-Evaluation - 02/22/20 1000      Goals   Current Weight  140 lb (63.5 kg)    Expected Outcome  Limit carbs at meals to appropriate amount to lower A1C out of prediabetes range.       Psychosocial: Target Goals: Acknowledge presence or absence of significant depression and/or stress, maximize coping skills, provide positive support system. Participant is able to verbalize types and ability to use techniques and skills needed for reducing stress and depression.  Initial Review & Psychosocial Screening: Initial Psych Review & Screening - 01/10/20 0853      Initial Review   Current issues with  History of Depression;Current Psychotropic Meds      Family Dynamics   Good Support System?  Yes    Comments  Mr. Raygor has a positive outlook and attitude. Does have some mild symptoms of depression secondary to not being able to travel to see his newly adopted grandchildren. He is very active with AA at the state level as he is a recoving alcoholic. He is also involved in his church outreach. He has a strong family support system including wife, children in a different state, and a step-daughter and grandchildren that live in [place]. He sees his psychiatrist yearly and is not in therapy at this time as his mental health is very stable. Patient denies barriers to self health management at this time.      Barriers   Psychosocial barriers to participate in program  Psychosocial barriers identified (see note)      Screening Interventions   Interventions  Encouraged to exercise;Provide feedback about the scores to  participant;To provide support and resources with identified psychosocial needs    Expected Outcomes  Short Term goal: Utilizing psychosocial counselor, staff and physician to assist with identification of specific Stressors or current issues interfering with healing process. Setting desired goal for each stressor or current issue identified.;Long Term Goal: Stressors or current issues are controlled or eliminated.;Short Term goal: Identification and review with participant of any Quality of Life or Depression concerns found by scoring the questionnaire.;Long Term goal: The participant improves quality of Life and PHQ9 Scores as seen by post scores and/or verbalization of changes       Quality of Life Scores: Quality of Life - 01/10/20 1231      Quality of Life   Select  Quality of Life      Quality of Life Scores   Health/Function Pre  15 %    Socioeconomic Pre  20.67 %    Psych/Spiritual Pre  20.3 %    Family Pre  22.7 %    GLOBAL Pre  18.41 %      Scores of 19 and below usually indicate a poorer quality of life in these areas.  A difference of  2-3 points is a clinically meaningful difference.  A difference of 2-3 points in the total score of the Quality of Life Index has been associated with significant improvement in overall quality of life, self-image, physical symptoms, and general health in studies assessing  change in quality of life.  PHQ-9: Recent Review Flowsheet Data    Depression screen [facility] 2/9 01/10/2020 09/29/2019 07/12/2018 06/29/2017 01/31/2016   Decreased Interest 0 1 0 0 0   Down, Depressed, Hopeless 0 0 0 0 0   PHQ - 2 Score 0 1 0 0 0     Interpretation of Total Score  Total Score Depression Severity:  1-4 = Minimal depression, 5-9 = Mild depression, 10-14 = Moderate depression, 15-19 = Moderately severe depression, 20-27 = Severe depression   Psychosocial Evaluation and Intervention: Psychosocial Evaluation - 02/08/20 1154      Psychosocial Evaluation & Interventions    Interventions  Encouraged to exercise with the program and follow exercise prescription    Comments  Daltyn reports management of his depression.  He enjoys reading and volunteering.    Expected Outcomes  Garlin will continue to maintain a positive outlook with good coping skills and participate in his hobbies to manage his depression.    Continue Psychosocial Services   No Follow up required       Psychosocial Re-Evaluation: Psychosocial Re-Evaluation    [place] Name 01/31/20 1057 02/27/20 1652           Psychosocial Re-Evaluation   Current issues with  --  Current Psychotropic Meds;History of Depression      Comments  Unable to reassess as patient is using virtual cardiac rehab through the [facility] APP at this time.  Noberto does not report any psychosocial needs at this time.      Expected Outcomes  --  Eura will maintain a positive outlook and report management of his history of depression.      Interventions  --  Encouraged to attend Cardiac Rehabilitation for the exercise      Continue Psychosocial Services   --  No Follow up required         Psychosocial Discharge (Final Psychosocial Re-Evaluation): Psychosocial Re-Evaluation - 02/27/20 1652      Psychosocial Re-Evaluation   Current issues with  Current Psychotropic Meds;History of Depression    Comments  Fabian does not report any psychosocial needs at this time.    Expected Outcomes  Roee will maintain a positive outlook and report management of his history of depression.    Interventions  Encouraged to attend Cardiac Rehabilitation for the exercise    Continue Psychosocial Services   No Follow up required       Vocational Rehabilitation: Provide vocational rehab assistance to qualifying candidates.   Vocational Rehab Evaluation & Intervention: Vocational Rehab - 01/10/20 0849      Initial Vocational Rehab Evaluation & Intervention   Assessment shows need for Vocational Rehabilitation  No        Education: Education Goals: Education classes will be provided on a weekly basis, covering required topics. Participant will state understanding/return demonstration of topics presented.  Learning Barriers/Preferences:   Education Topics: Count Your Pulse:  -Group instruction provided by verbal instruction, demonstration, patient participation and written materials to support subject.  Instructors address importance of being able to find your pulse and how to count your pulse when at home without a heart monitor.  Patients get hands on experience counting their pulse with staff help and individually.   Heart Attack, Angina, and Risk Factor Modification:  -Group instruction provided by verbal instruction, video, and written materials to support subject.  Instructors address signs and symptoms of angina and heart attacks.    Also discuss risk factors for heart disease and how  to make changes to improve heart health risk factors.   Functional Fitness:  -Group instruction provided by verbal instruction, demonstration, patient participation, and written materials to support subject.  Instructors address safety measures for doing things around the house.  Discuss how to get up and down off the floor, how to pick things up properly, how to safely get out of a chair without assistance, and balance training.   Meditation and Mindfulness:  -Group instruction provided by verbal instruction, patient participation, and written materials to support subject.  Instructor addresses importance of mindfulness and meditation practice to help reduce stress and improve awareness.  Instructor also leads participants through a meditation exercise.    Stretching for Flexibility and Mobility:  -Group instruction provided by verbal instruction, patient participation, and written materials to support subject.  Instructors lead participants through series of stretches that are designed to increase flexibility thus  improving mobility.  These stretches are additional exercise for major muscle groups that are typically performed during regular warm up and cool down.   Hands Only CPR:  -Group verbal, video, and participation provides a basic overview of AHA guidelines for community CPR. Role-play of emergencies allow participants the opportunity to practice calling for help and chest compression technique with discussion of AED use.   Hypertension: -Group verbal and written instruction that provides a basic overview of hypertension including the most recent diagnostic guidelines, risk factor reduction with self-care instructions and medication management.    Nutrition I class: Heart Healthy Eating:  -Group instruction provided by PowerPoint slides, verbal discussion, and written materials to support subject matter. The instructor gives an explanation and review of the Therapeutic Lifestyle Changes diet recommendations, which includes a discussion on lipid goals, dietary fat, sodium, fiber, plant stanol/sterol esters, sugar, and the components of a well-balanced, healthy diet.   Nutrition II class: Lifestyle Skills:  -Group instruction provided by PowerPoint slides, verbal discussion, and written materials to support subject matter. The instructor gives an explanation and review of label reading, grocery shopping for heart health, heart healthy recipe modifications, and ways to make healthier choices when eating out.   Diabetes Question & Answer:  -Group instruction provided by PowerPoint slides, verbal discussion, and written materials to support subject matter. The instructor gives an explanation and review of diabetes co-morbidities, pre- and post-prandial blood glucose goals, pre-exercise blood glucose goals, signs, symptoms, and treatment of hypoglycemia and hyperglycemia, and foot care basics.   Diabetes Blitz:  -Group instruction provided by PowerPoint slides, verbal discussion, and written  materials to support subject matter. The instructor gives an explanation and review of the physiology behind type 1 and type 2 diabetes, diabetes medications and rational behind using different medications, pre- and post-prandial blood glucose recommendations and Hemoglobin A1c goals, diabetes diet, and exercise including blood glucose guidelines for exercising safely.    Portion Distortion:  -Group instruction provided by PowerPoint slides, verbal discussion, written materials, and food models to support subject matter. The instructor gives an explanation of serving size versus portion size, changes in portions sizes over the last 20 years, and what consists of a serving from each food group.   Stress Management:  -Group instruction provided by verbal instruction, video, and written materials to support subject matter.  Instructors review role of stress in heart disease and how to cope with stress positively.     Exercising on Your Own:  -Group instruction provided by verbal instruction, power point, and written materials to support subject.  Instructors discuss benefits of exercise,  components of exercise, frequency and intensity of exercise, and end points for exercise.  Also discuss use of nitroglycerin and activating EMS.  Review options of places to exercise outside of rehab.  Review guidelines for sex with heart disease.   Cardiac Drugs I:  -Group instruction provided by verbal instruction and written materials to support subject.  Instructor reviews cardiac drug classes: antiplatelets, anticoagulants, beta blockers, and statins.  Instructor discusses reasons, side effects, and lifestyle considerations for each drug class.   Cardiac Drugs II:  -Group instruction provided by verbal instruction and written materials to support subject.  Instructor reviews cardiac drug classes: angiotensin converting enzyme inhibitors (ACE-I), angiotensin II receptor blockers (ARBs), nitrates, and calcium  channel blockers.  Instructor discusses reasons, side effects, and lifestyle considerations for each drug class.   Anatomy and Physiology of the Circulatory System:  Group verbal and written instruction and models provide basic cardiac anatomy and physiology, with the coronary electrical and arterial systems. Review of: AMI, Angina, Valve disease, Heart Failure, Peripheral Artery Disease, Cardiac Arrhythmia, Pacemakers, and the ICD.   Other Education:  -Group or individual verbal, written, or video instructions that support the educational goals of the cardiac rehab program.   Holiday Eating Survival Tips:  -Group instruction provided by PowerPoint slides, verbal discussion, and written materials to support subject matter. The instructor gives patients tips, tricks, and techniques to help them not only survive but enjoy the holidays despite the onslaught of food that accompanies the holidays.   Knowledge Questionnaire Score: Knowledge Questionnaire Score - 01/10/20 1232      Knowledge Questionnaire Score   Pre Score  25/28       Core Components/Risk Factors/Patient Goals at Admission: Personal Goals and Risk Factors at Admission - 01/10/20 0852      Core Components/Risk Factors/Patient Goals on Admission   Tobacco Cessation  Yes    Intervention  Assist the participant in steps to quit. Provide individualized education and counseling about committing to Tobacco Cessation, relapse prevention, and pharmacological support that can be provided by physician.;Advice worker, assist with locating and accessing local/national Quit Smoking programs, and support quit date choice.    Expected Outcomes  Short Term: Will demonstrate readiness to quit, by selecting a quit date.;Short Term: Will quit all tobacco product use, adhering to prevention of relapse plan.;Long Term: Complete abstinence from all tobacco products for at least 12 months from quit date.    Hypertension  Yes     Intervention  Provide education on lifestyle modifcations including regular physical activity/exercise, weight management, moderate sodium restriction and increased consumption of fresh fruit, vegetables, and low fat dairy, alcohol moderation, and smoking cessation.;Monitor prescription use compliance.    Expected Outcomes  Short Term: Continued assessment and intervention until BP is < 140/67mm HG in hypertensive participants. < 130/30mm HG in hypertensive participants with diabetes, heart failure or chronic kidney disease.;Long Term: Maintenance of blood pressure at goal levels.    Lipids  Yes    Intervention  Provide education and support for participant on nutrition & aerobic/resistive exercise along with prescribed medications to achieve LDL 70mg , HDL >40mg .    Expected Outcomes  Short Term: Participant states understanding of desired cholesterol values and is compliant with medications prescribed. Participant is following exercise prescription and nutrition guidelines.;Long Term: Cholesterol controlled with medications as prescribed, with individualized exercise RX and with personalized nutrition plan. Value goals: LDL < 70mg , HDL > 40 mg.       Core Components/Risk Factors/Patient Goals Review:  Goals and Risk Factor Review    Row Name 01/31/20 1100 02/08/20 1157 02/27/20 1654         Core Components/Risk Factors/Patient Goals Review   Personal Goals Review  Tobacco Cessation;Hypertension;Lipids  Tobacco Cessation;Hypertension;Lipids  Tobacco Cessation;Hypertension;Lipids     Review  Johnathan is exercising and using the virtual [employer] APP for exercise. Patient continues to work on smoking cessation.  Pt with multiple CAD RFs willing to participate in CR exercise.  He started exercise.  He had some pain in his thighs during exercise. VSS.  Pt with multiple CAD RFs willing to participate in CR exercise.  Dyson is tolerating exercise well with changes in his exercise prescription.   VSS.     Expected Outcomes  Patient will continue to participate in cardiac rehab for exercise, nutrtion and lifestyle modifications  Patient will continue to participate in cardiac rehab for exercise, nutrtion and lifestyle modifications  Patient will continue to participate in cardiac rehab for exercise, nutrtion and lifestyle modifications        Core Components/Risk Factors/Patient Goals at Discharge (Final Review):  Goals and Risk Factor Review - 02/27/20 1654      Core Components/Risk Factors/Patient Goals Review   Personal Goals Review  Tobacco Cessation;Hypertension;Lipids    Review  Pt with multiple CAD RFs willing to participate in CR exercise.  Darren is tolerating exercise well with changes in his exercise prescription.  VSS.    Expected Outcomes  Patient will continue to participate in cardiac rehab for exercise, nutrtion and lifestyle modifications       ITP Comments: ITP Comments    Row Name 01/10/20 [id number] 01/31/20 1055 02/08/20 1150 02/27/20 1220     ITP Comments  Dr. Fransico Him, Medical Director [facility]  30 Day ITP Review. Phase 2 cardiac rehab is resuming in person exercise on 02/06/20  Pt started exercise today.  VSS.  He experienced some pain in his thighs and back during exercise. Will plan to assess pt and exercise prescription on second day of exercise.  30 Day ITP Review. Antelmo is tolerating exercise well with changes to his exercise prescription.       Comments: See ITP Comments.

## 2020-03-02 ENCOUNTER — Encounter (HOSPITAL_COMMUNITY)
Admission: RE | Admit: 2020-03-02 | Discharge: 2020-03-02 | Disposition: A | Discharge status: Home / Self Care | Payer: PPO | Source: Ambulatory Visit | Attending: Cardiology | Admitting: Cardiology

## 2020-03-02 ENCOUNTER — Encounter: Payer: Self-pay | Admitting: Physician Assistant

## 2020-03-02 ENCOUNTER — Other Ambulatory Visit: Payer: Self-pay

## 2020-03-02 DIAGNOSIS — Z953 Presence of xenogenic heart valve: Secondary | ICD-10-CM | POA: Diagnosis not present

## 2020-03-02 DIAGNOSIS — Z951 Presence of aortocoronary bypass graft: Secondary | ICD-10-CM

## 2020-03-05 ENCOUNTER — Encounter (HOSPITAL_COMMUNITY)
Admission: RE | Admit: 2020-03-05 | Discharge: 2020-03-05 | Disposition: A | Discharge status: Home / Self Care | Payer: PPO | Source: Ambulatory Visit | Attending: Cardiology | Admitting: Cardiology

## 2020-03-05 ENCOUNTER — Other Ambulatory Visit: Payer: Self-pay

## 2020-03-05 DIAGNOSIS — Z953 Presence of xenogenic heart valve: Secondary | ICD-10-CM

## 2020-03-05 DIAGNOSIS — Z951 Presence of aortocoronary bypass graft: Secondary | ICD-10-CM

## 2020-03-07 ENCOUNTER — Encounter (HOSPITAL_COMMUNITY): Payer: PPO

## 2020-03-09 ENCOUNTER — Encounter (HOSPITAL_COMMUNITY)
Admission: RE | Admit: 2020-03-09 | Discharge: 2020-03-09 | Disposition: A | Discharge status: Home / Self Care | Payer: PPO | Source: Ambulatory Visit | Attending: Cardiology | Admitting: Cardiology

## 2020-03-09 ENCOUNTER — Other Ambulatory Visit: Payer: Self-pay

## 2020-03-09 DIAGNOSIS — Z951 Presence of aortocoronary bypass graft: Secondary | ICD-10-CM

## 2020-03-09 DIAGNOSIS — Z953 Presence of xenogenic heart valve: Secondary | ICD-10-CM

## 2020-03-12 ENCOUNTER — Encounter (HOSPITAL_COMMUNITY)
Admission: RE | Admit: 2020-03-12 | Discharge: 2020-03-12 | Disposition: A | Discharge status: Home / Self Care | Payer: PPO | Source: Ambulatory Visit | Attending: Cardiology | Admitting: Cardiology

## 2020-03-12 ENCOUNTER — Other Ambulatory Visit: Payer: Self-pay

## 2020-03-12 DIAGNOSIS — Z953 Presence of xenogenic heart valve: Secondary | ICD-10-CM

## 2020-03-12 DIAGNOSIS — Z951 Presence of aortocoronary bypass graft: Secondary | ICD-10-CM

## 2020-03-14 ENCOUNTER — Encounter (HOSPITAL_COMMUNITY)
Admission: RE | Admit: 2020-03-14 | Discharge: 2020-03-14 | Disposition: A | Discharge status: Home / Self Care | Payer: PPO | Source: Ambulatory Visit | Attending: Cardiology | Admitting: Cardiology

## 2020-03-14 ENCOUNTER — Other Ambulatory Visit: Payer: Self-pay

## 2020-03-14 DIAGNOSIS — Z953 Presence of xenogenic heart valve: Secondary | ICD-10-CM | POA: Diagnosis not present

## 2020-03-14 DIAGNOSIS — Z951 Presence of aortocoronary bypass graft: Secondary | ICD-10-CM

## 2020-03-16 ENCOUNTER — Encounter (HOSPITAL_COMMUNITY): Payer: PPO

## 2020-03-19 ENCOUNTER — Encounter (HOSPITAL_COMMUNITY): Payer: PPO

## 2020-03-20 ENCOUNTER — Ambulatory Visit (INDEPENDENT_AMBULATORY_CARE_PROVIDER_SITE_OTHER): Payer: PPO | Admitting: Family Medicine

## 2020-03-20 ENCOUNTER — Other Ambulatory Visit: Payer: Self-pay

## 2020-03-20 ENCOUNTER — Encounter: Payer: Self-pay | Admitting: Family Medicine

## 2020-03-20 VITALS — BP 136/68 | Temp 97.4°F | Wt 141.6 lb

## 2020-03-20 DIAGNOSIS — B070 Plantar wart: Secondary | ICD-10-CM | POA: Diagnosis not present

## 2020-03-20 NOTE — Progress Notes (Signed)
   Subjective:    Patient ID: Gregory [name], male    DOB: 11/22/1951, 69 y.o.   MRN: [id number]  HPI  Documentation for virtual audio and video telecommunications through [place] encounter:  The patient was located at home. The provider was located in the office. The patient did consent to this visit and is aware of possible charges through their insurance for this visit. The other persons participating in this telemedicine service were none. Time spent on call was 12 minutes  This virtual service is not related to other E/M service within previous 7 days. He complains of a knot on the plantar surface of his foot between the fourth and fifth toes.  It feels as if he is stepping on a stone.  And has a black central core to it.    Review of Systems     Objective:   Physical Exam Alert and in no distress.  Visualizing this through [place] was difficult but it appeared to potentially be a wart.      Assessment & Plan:  Plantar wart I discussed the fact it was difficult to see under the present circumstances but probably is a wart.  I discussed the use of Compound W and daily for 3 to 4 days and then an emery board to scrape away the white dead tissue and reapplying this technique several times.  He is also to use a Dr. Felicie Morn donut pad to protect the area.  He will return here if he is not successful.  He was comfortable with that.

## 2020-03-21 ENCOUNTER — Encounter (HOSPITAL_COMMUNITY): Payer: PPO

## 2020-03-22 NOTE — Progress Notes (Signed)
Discharge Progress Report  Patient Details  Name: Gregory [name] MRN: [id number] Date of Birth: Jul 06, 1951 Referring Provider:     [facility] from 01/10/2020 in [place]  Referring Provider  [name], Peter, MD       Number of Visits: 11  Reason for Discharge:  Patient reached a stable level of exercise. Patient independent in their exercise.  Smoking History:  Social History   Tobacco Use  Smoking Status Current Every Day Smoker  . Packs/day: 1.00  . Years: 25.00  . Pack years: 25.00  . Types: Cigarettes  Smokeless Tobacco Never Used  Tobacco Comment   Pt had stopped smoking X10 years/ currently smoking a pack a day     Diagnosis:  S/P CABG x 1  S/P aortic valve replacement with bioprosthetic valve  ADL UCSD:   Initial Exercise Prescription: Initial Exercise Prescription - 01/10/20 1200      Date of Initial Exercise RX and Referring Provider   Date  01/10/20    Referring Provider  [name], Peter, MD      Track   Minutes  30      Prescription Details   Frequency (times per week)  5-7    Duration  Progress to 30 minutes of continuous aerobic without signs/symptoms of physical distress      Intensity   THRR 40-80% of Max Heartrate  60-121    Ratings of Perceived Exertion  11-13    Perceived Dyspnea  0-4      Progression   Progression  Continue to progress workloads to maintain intensity without signs/symptoms of physical distress.      Resistance Training   Training Prescription  Yes    Reps  10-15       Discharge Exercise Prescription (Final Exercise Prescription Changes): Exercise Prescription Changes - 03/14/20 0715      Response to Exercise   Blood Pressure (Admit)  120/60    Blood Pressure (Exercise)  168/78    Blood Pressure (Exit)  120/68    Heart Rate (Admit)  62 bpm    Heart Rate (Exercise)  97 bpm    Heart Rate (Exit)  67 bpm    Rating of Perceived Exertion (Exercise)  12     Perceived Dyspnea (Exercise)  0    Symptoms  None    Comments  Pt's last day of exercise.     Duration  Progress to 30 minutes of  aerobic without signs/symptoms of physical distress    Intensity  THRR unchanged      Progression   Progression  Continue to progress workloads to maintain intensity without signs/symptoms of physical distress.    Average METs  3.4      Resistance Training   Training Prescription  Yes    Weight  3 lbs.     Reps  10-15    Time  10 Minutes      Interval Training   Interval Training  No      Recumbant Bike   Level  2    Watts  4    Minutes  15    METs  3.4      Arm Ergometer   Level  2    Watts  4    Minutes  15    METs  7      Home Exercise Plan   Plans to continue exercise at  [employer] (comment)    Frequency  Add 3  additional days to program exercise sessions.    Initial Home Exercises Provided  03/14/20       Functional Capacity: 6 Minute Walk    Row Name 01/10/20 0919         6 Minute Walk   Phase  Initial     Distance  1001 feet     Walk Time  6 minutes     # of Rest Breaks  1     MPH  1.9     METS  2.53     RPE  11     Perceived Dyspnea   0     VO2 Peak  8.85     Symptoms  Yes (comment)     Comments  Cramping in gluteus area.     Resting HR  64 bpm     Resting BP  114/50     Resting Oxygen Saturation   99 %     Exercise Oxygen Saturation  during 6 min walk  100 %     Max Ex. HR  83 bpm     Max Ex. BP  142/58     2 Minute Post BP  134/60        Psychological, QOL, Others - Outcomes: PHQ 2/9: Depression screen [facility] 2/9 03/22/2020 01/10/2020 09/29/2019 07/12/2018 06/29/2017  Decreased Interest 0 0 1 0 0  Down, Depressed, Hopeless 0 0 0 0 0  PHQ - 2 Score 0 0 1 0 0  Some recent data might be hidden    Quality of Life: Quality of Life - 01/10/20 1231      Quality of Life   Select  Quality of Life      Quality of Life Scores   Health/Function Pre  15 %    Socioeconomic Pre  20.67 %    Psych/Spiritual Pre   20.3 %    Family Pre  22.7 %    GLOBAL Pre  18.41 %       Personal Goals: Goals established at orientation with interventions provided to work toward goal. Personal Goals and Risk Factors at Admission - 01/10/20 0852      Core Components/Risk Factors/Patient Goals on Admission   Tobacco Cessation  Yes    Intervention  Assist the participant in steps to quit. Provide individualized education and counseling about committing to Tobacco Cessation, relapse prevention, and pharmacological support that can be provided by physician.;Advice worker, assist with locating and accessing local/national Quit Smoking programs, and support quit date choice.    Expected Outcomes  Short Term: Will demonstrate readiness to quit, by selecting a quit date.;Short Term: Will quit all tobacco product use, adhering to prevention of relapse plan.;Long Term: Complete abstinence from all tobacco products for at least 12 months from quit date.    Hypertension  Yes    Intervention  Provide education on lifestyle modifcations including regular physical activity/exercise, weight management, moderate sodium restriction and increased consumption of fresh fruit, vegetables, and low fat dairy, alcohol moderation, and smoking cessation.;Monitor prescription use compliance.    Expected Outcomes  Short Term: Continued assessment and intervention until BP is < 140/70mm HG in hypertensive participants. < 130/69mm HG in hypertensive participants with diabetes, heart failure or chronic kidney disease.;Long Term: Maintenance of blood pressure at goal levels.    Lipids  Yes    Intervention  Provide education and support for participant on nutrition & aerobic/resistive exercise along with prescribed medications to achieve LDL 70mg , HDL >40mg .  Expected Outcomes  Short Term: Participant states understanding of desired cholesterol values and is compliant with medications prescribed. Participant is following exercise  prescription and nutrition guidelines.;Long Term: Cholesterol controlled with medications as prescribed, with individualized exercise RX and with personalized nutrition plan. Value goals: LDL < 70mg , HDL > 40 mg.        Personal Goals Discharge: Goals and Risk Factor Review    Row Name 01/31/20 1100 02/08/20 1157 02/27/20 1654 03/22/20 1030       Core Components/Risk Factors/Patient Goals Review   Personal Goals Review  Tobacco Cessation;Hypertension;Lipids  Tobacco Cessation;Hypertension;Lipids  Tobacco Cessation;Hypertension;Lipids  Tobacco Cessation;Hypertension;Lipids    Review  Johnathan is exercising and using the virtual [employer] APP for exercise. Patient continues to work on smoking cessation.  Pt with multiple CAD RFs willing to participate in CR exercise.  He started exercise.  He had some pain in his thighs during exercise. VSS.  Pt with multiple CAD RFs willing to participate in CR exercise.  Shawndell is tolerating exercise well with changes in his exercise prescription.  VSS.  Becket has graduated from [place] with 11 copleted sessions.  He graduated early d/t getting back to the gym and his copay.  He feel that he increased his strength with participating in CR.    Expected Outcomes  Patient will continue to participate in cardiac rehab for exercise, nutrtion and lifestyle modifications  Patient will continue to participate in cardiac rehab for exercise, nutrtion and lifestyle modifications  Patient will continue to participate in cardiac rehab for exercise, nutrtion and lifestyle modifications  Patient will continue to participate in exercise, nutrtion and lifestyle modifications.  Nissim plans to work out at his gym.  He has already returned to doing so.       Exercise Goals and Review: Exercise Goals    Row Name 01/10/20 1230             Exercise Goals   Increase Physical Activity  Yes       Intervention  Provide advice, education, support and counseling about physical  activity/exercise needs.;Develop an individualized exercise prescription for aerobic and resistive training based on initial evaluation findings, risk stratification, comorbidities and participant's personal goals.       Expected Outcomes  Short Term: Attend rehab on a regular basis to increase amount of physical activity.;Long Term: Exercising regularly at least 3-5 days a week.;Long Term: Add in home exercise to make exercise part of routine and to increase amount of physical activity.       Increase Strength and Stamina  Yes       Intervention  Provide advice, education, support and counseling about physical activity/exercise needs.;Develop an individualized exercise prescription for aerobic and resistive training based on initial evaluation findings, risk stratification, comorbidities and participant's personal goals.       Expected Outcomes  Short Term: Increase workloads from initial exercise prescription for resistance, speed, and METs.;Short Term: Perform resistance training exercises routinely during rehab and add in resistance training at home;Long Term: Improve cardiorespiratory fitness, muscular endurance and strength as measured by increased METs and functional capacity (6MWT)       Able to understand and use rate of perceived exertion (RPE) scale  Yes       Intervention  Provide education and explanation on how to use RPE scale       Expected Outcomes  Short Term: Able to use RPE daily in rehab to express subjective intensity level;Long Term:  Able to use RPE  to guide intensity level when exercising independently       Knowledge and understanding of Target Heart Rate Range (THRR)  Yes       Intervention  Provide education and explanation of THRR including how the numbers were predicted and where they are located for reference       Expected Outcomes  Short Term: Able to state/look up THRR;Long Term: Able to use THRR to govern intensity when exercising independently;Short Term: Able to use  daily as guideline for intensity in rehab       Able to check pulse independently  Yes       Intervention  Provide education and demonstration on how to check pulse in carotid and radial arteries.;Review the importance of being able to check your own pulse for safety during independent exercise       Expected Outcomes  Short Term: Able to explain why pulse checking is important during independent exercise;Long Term: Able to check pulse independently and accurately       Understanding of Exercise Prescription  Yes       Intervention  Provide education, explanation, and written materials on patient's individual exercise prescription       Expected Outcomes  Short Term: Able to explain program exercise prescription;Long Term: Able to explain home exercise prescription to exercise independently          Exercise Goals Re-Evaluation: Exercise Goals Re-Evaluation    Row Name 01/30/20 1401 02/08/20 1017 02/10/20 1204 02/27/20 0901       Exercise Goal Re-Evaluation   Exercise Goals Review  --  Increase Physical Activity;Increase Strength and Stamina;Able to understand and use rate of perceived exertion (RPE) scale;Knowledge and understanding of Target Heart Rate Range (THRR);Understanding of Exercise Prescription  Increase Physical Activity;Increase Strength and Stamina;Able to understand and use rate of perceived exertion (RPE) scale;Knowledge and understanding of Target Heart Rate Range (THRR);Able to check pulse independently;Understanding of Exercise Prescription  Increase Physical Activity;Increase Strength and Stamina;Able to understand and use rate of perceived exertion (RPE) scale;Knowledge and understanding of Target Heart Rate Range (THRR);Able to check pulse independently;Understanding of Exercise Prescription    Comments  Patient has been actively participating in the virtual cardiac rehab program via the [employer] app and has been doing fairly well. Patient is walking 15-25 minutes as his  mode of exercise.  Pt first day of exercise in CR program. Pt tolerated exercise well but did complain about having lower extremity pain on both stepper and rec bike. Will try these again next session and alter exercise Rx if pain continues. Pt understands THRR, RPE scale, and exercise Rx.  Pt exercise Rx changed due to pain in lower extremities on first day of exercise. Pt is now using the arm crank and rec bike for 30 minutes. Pt tolerated todays exercise session well without lower extremity pain. Will continue with this this exercise Rx.  Reviewed HEP with Pt. Pt is enrolled in virtual CR program as well and has been using the app to log his at home exercise sessions. Pt stated he has been experiencing back pain and is going to consult his doctor in regards to this. Encouraged Pt to walk as much as he can without back pain. Pt stated this has been hindering his exercise. Pt understands goals and guidelines for exercising at home.    Expected Outcomes  Patient will transition to the onsite cardiac rehab program if interested  Will continue to monitor and progress Pt as tolerated.  Will  continue to monitor and progress Pt as tolerated.  Will continue to monitor and progress Pt as tolerated.       Nutrition & Weight - Outcomes: Pre Biometrics - 02/08/20 1257      Pre Biometrics   Height  5' 4.75" (1.645 m)    Weight  62.7 kg    Waist Circumference  36 inches    Hip Circumference  35 inches    Waist to Hip Ratio  1.03 %    BMI (Calculated)  23.17    Triceps Skinfold  15 mm    % Body Fat  24.4 %    Grip Strength  31.5 kg    Flexibility  11 in    Single Leg Stand  0 seconds        Nutrition: Nutrition Therapy & Goals - 02/22/20 1000      Nutrition Therapy   Diet  Heart Healthy      Personal Nutrition Goals   Nutrition Goal  Pt to read labels for total carbohydrates and limit <75 g for meals    Personal Goal #2  Pt to build a healthy plate including vegetables, fruits, whole grains, and  low-fat dairy products in a heart healthy meal plan      Intervention Plan   Intervention  Prescribe, educate and counsel regarding individualized specific dietary modifications aiming towards targeted core components such as weight, hypertension, lipid management, diabetes, heart failure and other comorbidities.    Expected Outcomes  Short Term Goal: Understand basic principles of dietary content, such as calories, fat, sodium, cholesterol and nutrients.;Long Term Goal: Adherence to prescribed nutrition plan.       Nutrition Discharge: Nutrition Assessments - 02/22/20 0951      MEDFICTS Scores   Pre Score  36       Education Questionnaire Score: Knowledge Questionnaire Score - 01/10/20 1232      Knowledge Questionnaire Score   Pre Score  25/28       Goals reviewed with patient; copy given to patient.

## 2020-03-23 ENCOUNTER — Encounter (HOSPITAL_COMMUNITY): Payer: PPO

## 2020-03-26 ENCOUNTER — Telehealth: Payer: Self-pay | Admitting: Cardiovascular Disease

## 2020-03-26 ENCOUNTER — Encounter (HOSPITAL_COMMUNITY): Payer: PPO

## 2020-03-26 MED ORDER — AMOXICILLIN 500 MG PO CAPS: ORAL_CAPSULE | ORAL | 1 refills | Status: DC

## 2020-03-26 NOTE — Telephone Encounter (Signed)
Spoke with patient. Amoxicillin refilled.

## 2020-03-26 NOTE — Telephone Encounter (Signed)
 *  STAT* If patient is at the pharmacy, call can be transferred to refill team.   1. Which medications need to be refilled? (please list name of each medication and dose if known) amoxicillin (AMOXIL) 500 MG capsule  2. Which pharmacy/location (including street and city if local pharmacy) is medication to be sent to? [place], [place] [address]  3. Do they need a 30 day or 90 day supply? Patient states medication is needed prior to dental appointment. Please assist

## 2020-03-28 ENCOUNTER — Encounter (HOSPITAL_COMMUNITY): Payer: PPO

## 2020-03-30 ENCOUNTER — Telehealth: Payer: Self-pay | Admitting: Cardiovascular Disease

## 2020-03-30 ENCOUNTER — Encounter (HOSPITAL_COMMUNITY): Payer: PPO

## 2020-03-30 NOTE — Telephone Encounter (Signed)
  *  STAT* If patient is at the pharmacy, call can be transferred to refill team.   1. Which medications need to be refilled? (please list name of each medication and dose if known) amoxicillin (AMOXIL) 500 MG capsule  2. Which pharmacy/location (including street and city if local pharmacy) is medication to be sent to? [facility] [address] [place] [address]  3. Do they need a 30 day or 90 day supply? 4 capsules  Pt said pharmacy lost prescription, please resend. He said his dental appt is on tuesday

## 2020-03-31 NOTE — Telephone Encounter (Signed)
Yes, that's fine 

## 2020-04-02 ENCOUNTER — Encounter (HOSPITAL_COMMUNITY): Payer: PPO

## 2020-04-02 ENCOUNTER — Other Ambulatory Visit: Payer: Self-pay | Admitting: Family Medicine

## 2020-04-02 DIAGNOSIS — I10 Essential (primary) hypertension: Secondary | ICD-10-CM

## 2020-04-03 MED ORDER — AMOXICILLIN 500 MG PO CAPS: ORAL_CAPSULE | ORAL | 1 refills | Status: DC

## 2020-04-05 ENCOUNTER — Telehealth: Payer: Self-pay | Admitting: Cardiology

## 2020-04-05 NOTE — Telephone Encounter (Signed)
Please take medication as prescribe by dentist. Clindamycin will protect valce from potential infection as well.  No interactions noted with any other current medication either.

## 2020-04-05 NOTE — Telephone Encounter (Signed)
Patient states his dentist would like to prescribe Clindamycin for infection and he is calling to ensure that medication intake will not be damaging to valve replacement. Please advise.

## 2020-04-05 NOTE — Telephone Encounter (Signed)
Pt updated and verbalized understanding.  

## 2020-04-10 ENCOUNTER — Encounter: Payer: Self-pay | Admitting: Cardiovascular Disease

## 2020-04-10 ENCOUNTER — Other Ambulatory Visit: Payer: Self-pay

## 2020-04-10 ENCOUNTER — Ambulatory Visit: Payer: PPO | Admitting: Cardiovascular Disease

## 2020-04-10 DIAGNOSIS — I739 Peripheral vascular disease, unspecified: Secondary | ICD-10-CM | POA: Diagnosis not present

## 2020-04-10 NOTE — Progress Notes (Signed)
04/10/2020 Gregory [name]   06-26-1951  [id number]  Primary Physician [name], MD Primary Cardiologist: Lorretta Harp MD [name], [place]  HPI:  Gregory [name] is a 69 y.o. 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  He was referred to me by Dr. [name] for peripheral vascular evaluation.  His primary provider is Dr. Redmond School.  Stress factors include greater than 100-pack-year history of tobacco abuse smoking up to 2 packs a day but now 1/2 pack a day with the intent to quit.  He has treated hypertension hyperlipidemia as well as carotid disease.  He does have significant lifestyle limiting claudication with Dopplers performed 11/06/2019 revealing ABIs in the 0.5 range bilaterally with an occluded right iliac and high-grade left common iliac artery stenosis.  He was cathed by Dr. [name] back in January and ultimately underwent CABG/AVR by Dr. Cyndia Bent on 11/24/2019 and is slowly rehabilitated.  He denies chest pain or shortness of breath.   Current Meds  Medication Sig  . acetaminophen (TYLENOL) 325 MG tablet Take 2 tablets (650 mg total) by mouth every 6 (six) hours as needed for mild pain.  [name] albuterol (PROVENTIL HFA;VENTOLIN HFA) 108 (90 Base) MCG/ACT inhaler Inhale 2 puffs into the lungs every 6 (six) hours as needed for wheezing.  [name] amLODipine (NORVASC) 10 MG tablet TAKE ONE TABLET BY MOUTH DAILY  . amoxicillin (AMOXIL) 500 MG capsule Take four capsules one hour before dental appointment.  [name] aspirin EC 325 MG EC tablet Take 1 tablet (325 mg total) by mouth daily.  [name] atorvastatin (LIPITOR) 80 MG tablet Take 1 tablet (80 mg total) by mouth daily.  . divalproex (DEPAKOTE) 500 MG DR tablet Take 500 mg by mouth at bedtime. 2-3 tabs  . finasteride (PROSCAR) 5 MG tablet TAKE ONE TABLET BY MOUTH DAILY  . fluticasone (FLONASE) 50 MCG/ACT  nasal spray SPRAY TWO SPRAYS IN THE AFFECTED NOSTRIL DAILY  . Fluticasone-Salmeterol (ADVAIR DISKUS) 500-50 MCG/DOSE AEPB Inhale 1 puff into the lungs every 12 (twelve) hours.  [name] lamoTRIgine (LAMICTAL) 200 MG tablet Take 1 tablet (200 mg total) by mouth every morning.  [name] lisinopril-hydrochlorothiazide (ZESTORETIC) 20-12.5 MG tablet TAKE ONE TABLET BY MOUTH EVERY MORNING  . Multiple Vitamins-Minerals (MULTIVITAMIN ADULTS 50+ PO) Take 1 tablet by mouth daily.      Allergies  Allergen Reactions  . Codeine Anaphylaxis    Social History   Socioeconomic History  . Marital status: Married    Spouse name: Not on file  . Number of children: 6  . Years of education: Not on file  . Highest education level: Not on file  Occupational History  . Not on file  Tobacco Use  . Smoking status: Current Every Day Smoker    Packs/day: 1.00    Years: 25.00    Pack years: 25.00    Types: Cigarettes  . Smokeless tobacco: Never Used  . Tobacco comment: Pt had stopped smoking X10 years/ currently smoking a pack a day   Substance and Sexual Activity  . Alcohol use: Yes    Alcohol/week: 78.0 standard drinks    Types: 50 Glasses of wine, 28 Standard drinks or equivalent per week    Comment: None for the past 5 years. 11/14/19  . Drug use: No    Comment: former alcoholic  . Sexual activity: Yes  Other Topics  Concern  . Not on file  Social History Narrative   Lives in [place] with wife.  Does not routinely exercise.  Sedentary r/t chronic lbp.   Social Determinants of Health   Financial Resource Strain:   . Difficulty of Paying Living Expenses:   Food Insecurity:   . Worried About Charity fundraiser in the Last Year:   . Arboriculturist in the Last Year:   Transportation Needs:   . Film/video editor (Medical):   [name] Lack of Transportation (Non-Medical):   Physical Activity:   . Days of Exercise per Week:   . Minutes of Exercise per Session:   Stress:   . Feeling of Stress :   Social  Connections:   . Frequency of Communication with Friends and Family:   . Frequency of Social Gatherings with Friends and Family:   . Attends Religious Services:   . Active Member of Clubs or Organizations:   . Attends Archivist Meetings:   [name] Marital Status:   Intimate Partner Violence:   . Fear of Current or Ex-Partner:   . Emotionally Abused:   [name] Physically Abused:   . Sexually Abused:      Review of Systems: General: negative for chills, fever, night sweats or weight changes.  Cardiovascular: negative for chest pain, dyspnea on exertion, edema, orthopnea, palpitations, paroxysmal nocturnal dyspnea or shortness of breath Dermatological: negative for rash Respiratory: negative for cough or wheezing Urologic: negative for hematuria Abdominal: negative for nausea, vomiting, diarrhea, bright red blood per rectum, melena, or hematemesis Neurologic: negative for visual changes, syncope, or dizziness All other systems reviewed and are otherwise negative except as noted above.    Blood pressure 136/64, pulse 68, height 5\' 5"  (1.651 m), weight 141 lb (64 kg), SpO2 99 %.  General appearance: alert and no distress Neck: no adenopathy, no JVD, supple, symmetrical, trachea midline, thyroid not enlarged, symmetric, no tenderness/mass/nodules and Soft bilateral carotid bruits Lungs: clear to auscultation bilaterally Heart: Soft outflow tract murmur Extremities: extremities normal, atraumatic, no cyanosis or edema Pulses: Absent pedal pulses Skin: Skin color, texture, turgor normal. No rashes or lesions Neurologic: Alert and oriented X 3, normal strength and tone. Normal symmetric reflexes. Normal coordination and gait  EKG not performed today  ASSESSMENT AND PLAN:   PAD (peripheral artery disease) [facility]) Mr. Gotz was referred to me by Dr. [name] for evaluation and treatment of PAD.  He has multiple cardiac risk factors.  He did undergo aortic valve replacement and CABG  11/24/2019.  He has bilateral hip buttock and lower extremity claudication with Dopplers performed 11 05/06/2019 revealing ABIs in the 0.5 range bilaterally with an occluded right iliac and high-grade left common iliac artery stenosis.  I cannot feel femoral pulses.  I am going to repeat aortoiliac Doppler studies to see if he still has a patent left iliac and if so we will proceed with angiography potential endovascular therapy.  Both iliacs are occluded I will order a CT angiogram to further evaluate his anatomy and suitability for aortobifemoral bypass grafting.      Lorretta Harp MD FACP,FACC,FAHA, [facility] 04/10/2020 10:29 AM

## 2020-04-10 NOTE — Patient Instructions (Signed)
Medication Instructions:  Your physician recommends that you continue on your current medications as directed. Please refer to the Current Medication list given to you today.  *If you need a refill on your cardiac medications before your next appointment, please call your pharmacy*  Testing/Procedures: Your physician has requested that you have a lower extremity arterial duplex. This test is an ultrasound of the arteries in the legs. It looks at arterial blood flow in the legs. Allow one hour for Lower Arterial scans. There are no restrictions or special instructions  Aorta/IVC/Illiac ultrasound  Follow-Up: At [facility], you and your health needs are our priority.  As part of our continuing mission to provide you with exceptional heart care, we have created designated Provider Care Teams.  These Care Teams include your primary Cardiologist (physician) and Advanced Practice Providers (APPs -  Physician Assistants and Nurse Practitioners) who all work together to provide you with the care you need, when you need it.  We recommend signing up for the patient portal called "MyChart".  Sign up information is provided on this After Visit Summary.  MyChart is used to connect with patients for Virtual Visits (Telemedicine).  Patients are able to view lab/test results, encounter notes, upcoming appointments, etc.  Non-urgent messages can be sent to your provider as well.   To learn more about what you can do with MyChart, go to [email or web address].    Your next appointment:   3 month(s)  The format for your next appointment:   In Person  Provider:   Dr. Gwenlyn Found

## 2020-04-10 NOTE — Assessment & Plan Note (Signed)
Gregory [name] was referred to me by Dr. [name] for evaluation and treatment of PAD.  He has multiple cardiac risk factors.  He did undergo aortic valve replacement and CABG 11/24/2019.  He has bilateral hip buttock and lower extremity claudication with Dopplers performed 11 05/06/2019 revealing ABIs in the 0.5 range bilaterally with an occluded right iliac and high-grade left common iliac artery stenosis.  I cannot feel femoral pulses.  I am going to repeat aortoiliac Doppler studies to see if he still has a patent left iliac and if so we will proceed with angiography potential endovascular therapy.  Both iliacs are occluded I will order a CT angiogram to further evaluate his anatomy and suitability for aortobifemoral bypass grafting.

## 2020-04-11 ENCOUNTER — Other Ambulatory Visit: Payer: Self-pay

## 2020-04-11 ENCOUNTER — Ambulatory Visit (INDEPENDENT_AMBULATORY_CARE_PROVIDER_SITE_OTHER): Payer: PPO | Admitting: Family Medicine

## 2020-04-11 ENCOUNTER — Encounter: Payer: Self-pay | Admitting: Family Medicine

## 2020-04-11 VITALS — BP 132/70 | HR 63 | Temp 98.0°F | Wt 142.6 lb

## 2020-04-11 DIAGNOSIS — M79673 Pain in unspecified foot: Secondary | ICD-10-CM

## 2020-04-11 NOTE — Progress Notes (Signed)
   Subjective:    Patient ID: Gregory [name], male    DOB: 10-16-1951, 69 y.o.   MRN: [id number]  HPI He is here for possible excision of the wart on the left foot near the fourth metatarsal.   Review of Systems     Objective:   Physical Exam Exam of the left foot does show a brownish lesion with another smaller area of whitish type tissue.  It is tender to palpation.  The footprint line goes all the way across this.      Assessment & Plan:  Pain of foot, unspecified laterality I explained that this is not a wart but most likely a foreign body and trying to remove that would be very difficult.  Recommend heat pad the area and let the body take care of itself.  He was comfortable with that.

## 2020-04-12 ENCOUNTER — Ambulatory Visit (HOSPITAL_COMMUNITY)
Admission: RE | Admit: 2020-04-12 | Discharge: 2020-04-12 | Disposition: A | Discharge status: Home / Self Care | Payer: PPO | Source: Ambulatory Visit | Attending: Cardiology | Admitting: Cardiology

## 2020-04-12 ENCOUNTER — Ambulatory Visit (HOSPITAL_BASED_OUTPATIENT_CLINIC_OR_DEPARTMENT_OTHER)
Admission: RE | Admit: 2020-04-12 | Discharge: 2020-04-12 | Disposition: A | Discharge status: Home / Self Care | Payer: PPO | Source: Ambulatory Visit | Attending: Cardiology | Admitting: Cardiology

## 2020-04-12 DIAGNOSIS — I739 Peripheral vascular disease, unspecified: Secondary | ICD-10-CM

## 2020-04-13 ENCOUNTER — Other Ambulatory Visit: Payer: Self-pay

## 2020-04-13 DIAGNOSIS — Z01812 Encounter for preprocedural laboratory examination: Secondary | ICD-10-CM

## 2020-04-13 DIAGNOSIS — I25810 Atherosclerosis of coronary artery bypass graft(s) without angina pectoris: Secondary | ICD-10-CM

## 2020-04-13 DIAGNOSIS — I739 Peripheral vascular disease, unspecified: Secondary | ICD-10-CM

## 2020-04-13 NOTE — Progress Notes (Signed)
ab 

## 2020-04-16 ENCOUNTER — Telehealth: Payer: Self-pay | Admitting: Cardiovascular Disease

## 2020-04-16 DIAGNOSIS — I739 Peripheral vascular disease, unspecified: Secondary | ICD-10-CM | POA: Diagnosis not present

## 2020-04-16 DIAGNOSIS — I25810 Atherosclerosis of coronary artery bypass graft(s) without angina pectoris: Secondary | ICD-10-CM | POA: Diagnosis not present

## 2020-04-16 DIAGNOSIS — Z01812 Encounter for preprocedural laboratory examination: Secondary | ICD-10-CM | POA: Diagnosis not present

## 2020-04-16 LAB — BASIC METABOLIC PANEL
BUN/Creatinine Ratio: 15 (ref 10–24)
BUN: 14 mg/dL (ref 8–27)
CO2: 27 mmol/L (ref 20–29)
Calcium: 9.7 mg/dL (ref 8.6–10.2)
Chloride: 100 mmol/L (ref 96–106)
Creatinine, Ser: 0.92 mg/dL (ref 0.76–1.27)
GFR calc Af Amer: 98 mL/min/{1.73_m2} (ref >59)
GFR calc non Af Amer: 85 mL/min/{1.73_m2} (ref >59)
Glucose: 96 mg/dL (ref 65–99)
Potassium: 5.3 mmol/L — ABNORMAL HIGH (ref 3.5–5.2)
Sodium: 139 mmol/L (ref 134–144)

## 2020-04-16 NOTE — Telephone Encounter (Signed)
Left message for patient to call and schedule CTA abdomen and pelvis ordered by Dr. Gwenlyn Found

## 2020-04-16 NOTE — Telephone Encounter (Signed)
Spoke with Gregory [name] appointment for CTA abd/pelvis scheduled 04/26/20 at [place] [address].  Arrival time is 8:30 am for check in.  Liquids only 4 hours prior.  Patient to come in this week for lab work.

## 2020-04-18 ENCOUNTER — Encounter (HOSPITAL_COMMUNITY): Payer: PPO

## 2020-04-18 ENCOUNTER — Telehealth: Payer: Self-pay | Admitting: Cardiology

## 2020-04-18 ENCOUNTER — Telehealth: Payer: Self-pay

## 2020-04-18 NOTE — Telephone Encounter (Signed)
Patient stated he was returning a call to [place]. However Malachy Mood was not able to take his call and she stated she will return the patient's call when she is available.

## 2020-04-18 NOTE — Telephone Encounter (Signed)
Received a call from patient.He stated he needs ct angio of abd/pelvis to be scheduled at [facility] due to his insurance.Advised I will send message to schedulers.

## 2020-04-19 ENCOUNTER — Telehealth: Payer: Self-pay | Admitting: Cardiology

## 2020-04-19 NOTE — Telephone Encounter (Signed)
   Pt is calling to follow up CT. He said he was advised by Malachy Mood she will helped him cancelling his CT and move to [facility] health instead of Home office.   Please call

## 2020-04-19 NOTE — Telephone Encounter (Signed)
Called patient- he advised that he was scheduled tomorrow with the CT, I did advise that it was cancelledMalachy Mood sent a message to scheduling team to have it moved to the [place] due to insurance issues.  Patient thankful for call back.

## 2020-04-20 ENCOUNTER — Encounter (HOSPITAL_COMMUNITY): Payer: PPO

## 2020-04-23 ENCOUNTER — Telehealth: Payer: Self-pay | Admitting: Cardiovascular Disease

## 2020-04-23 NOTE — Telephone Encounter (Signed)
Left message for patient to call and reschedule CTA chest at [facility]

## 2020-04-26 ENCOUNTER — Inpatient Hospital Stay: Admission: RE | Admit: 2020-04-26 | Payer: PPO | Source: Ambulatory Visit

## 2020-04-30 ENCOUNTER — Other Ambulatory Visit: Payer: Self-pay | Admitting: Family Medicine

## 2020-04-30 DIAGNOSIS — N401 Enlarged prostate with lower urinary tract symptoms: Secondary | ICD-10-CM

## 2020-04-30 DIAGNOSIS — R3914 Feeling of incomplete bladder emptying: Secondary | ICD-10-CM

## 2020-05-01 ENCOUNTER — Ambulatory Visit (HOSPITAL_COMMUNITY)
Admission: RE | Admit: 2020-05-01 | Discharge: 2020-05-01 | Disposition: A | Discharge status: Home / Self Care | Payer: PPO | Source: Ambulatory Visit | Attending: Cardiovascular Disease | Admitting: Cardiovascular Disease

## 2020-05-01 ENCOUNTER — Other Ambulatory Visit: Payer: Self-pay

## 2020-05-01 ENCOUNTER — Ambulatory Visit: Payer: PPO | Admitting: Cardiovascular Disease

## 2020-05-01 DIAGNOSIS — I745 Embolism and thrombosis of iliac artery: Secondary | ICD-10-CM | POA: Diagnosis not present

## 2020-05-01 DIAGNOSIS — I25810 Atherosclerosis of coronary artery bypass graft(s) without angina pectoris: Secondary | ICD-10-CM | POA: Diagnosis not present

## 2020-05-01 DIAGNOSIS — I739 Peripheral vascular disease, unspecified: Secondary | ICD-10-CM | POA: Insufficient documentation

## 2020-05-01 DIAGNOSIS — Z01812 Encounter for preprocedural laboratory examination: Secondary | ICD-10-CM | POA: Diagnosis not present

## 2020-05-01 DIAGNOSIS — I700 Atherosclerosis of aorta: Secondary | ICD-10-CM | POA: Diagnosis not present

## 2020-05-01 MED ORDER — IOHEXOL 350 MG/ML SOLN
100.0000 mL | Freq: Once | INTRAVENOUS | Status: AC | PRN
  Administered 2020-05-01: 100 mL via INTRAVENOUS

## 2020-05-10 ENCOUNTER — Encounter: Payer: Self-pay | Admitting: Family Medicine

## 2020-05-10 ENCOUNTER — Telehealth (INDEPENDENT_AMBULATORY_CARE_PROVIDER_SITE_OTHER): Payer: PPO | Admitting: Family Medicine

## 2020-05-10 VITALS — BP 132/72 | HR 62 | Temp 98.3°F | Wt 140.6 lb

## 2020-05-10 DIAGNOSIS — W57XXXA Bitten or stung by nonvenomous insect and other nonvenomous arthropods, initial encounter: Secondary | ICD-10-CM | POA: Diagnosis not present

## 2020-05-10 DIAGNOSIS — S40262A Insect bite (nonvenomous) of left shoulder, initial encounter: Secondary | ICD-10-CM | POA: Diagnosis not present

## 2020-05-10 NOTE — Progress Notes (Signed)
   Subjective:    Patient ID: Gregory [name], male    DOB: 05-10-1951, 69 y.o.   MRN: [id number]  HPI He is here for evaluation of lesion present on the left posterior shoulder area.   Review of Systems     Objective:   Physical Exam Exam of the left shoulder does show a tick that is engorged.  The surrounding skin is entirely normal.       Assessment & Plan:  Tick bite The tick was removed in a twisting motion removing the head as well.  The surrounding skin looks normal. Cautioned to watch out for fever, headache and rash.  Do not feel the need to place him on doxycycline at the present time.

## 2020-05-11 ENCOUNTER — Other Ambulatory Visit: Payer: Self-pay

## 2020-05-11 ENCOUNTER — Ambulatory Visit (INDEPENDENT_AMBULATORY_CARE_PROVIDER_SITE_OTHER): Payer: PPO | Admitting: Cardiovascular Disease

## 2020-05-11 ENCOUNTER — Encounter: Payer: Self-pay | Admitting: Cardiovascular Disease

## 2020-05-11 VITALS — BP 144/70 | HR 66 | Resp 97 | Ht 64.5 in | Wt 139.4 lb

## 2020-05-11 DIAGNOSIS — I739 Peripheral vascular disease, unspecified: Secondary | ICD-10-CM

## 2020-05-11 NOTE — Progress Notes (Signed)
Mr. Rash returns today for follow-up of his CTA of his aortoiliac system that showed bilateral iliac occlusion and calcified aortic disease.  He is symptomatic and probably will require aortobifemoral bypass grafting.  I am referring him to Dr. [name] for surgical evaluation and consideration.  Lorretta Harp, M.D., [place], [facility], [place] [facility] [address] [place], [place]  [address]  [phone] 05/11/2020 11:16 AM

## 2020-05-11 NOTE — Patient Instructions (Addendum)
Medication Instructions:  The current medical regimen is effective;  continue present plan and medications.  *If you need a refill on your cardiac medications before your next appointment, please call your pharmacy*   Follow-Up: At [facility], you and your health needs are our priority.  As part of our continuing mission to provide you with exceptional heart care, we have created designated Provider Care Teams.  These Care Teams include your primary Cardiologist (physician) and Advanced Practice Providers (APPs -  Physician Assistants and Nurse Practitioners) who all work together to provide you with the care you need, when you need it.  We recommend signing up for the patient portal called "MyChart".  Sign up information is provided on this After Visit Summary.  MyChart is used to connect with patients for Virtual Visits (Telemedicine).  Patients are able to view lab/test results, encounter notes, upcoming appointments, etc.  Non-urgent messages can be sent to your provider as well.   To learn more about what you can do with MyChart, go to [email or web address].    Your next appointment:   As needed  The format for your next appointment:   In Person  Provider:   Quay Burow, MD   Other Instructions Referral to Dr.Brabham they will contact you to schedule

## 2020-05-21 ENCOUNTER — Other Ambulatory Visit: Payer: Self-pay | Admitting: Family Medicine

## 2020-05-24 ENCOUNTER — Telehealth: Payer: Self-pay | Admitting: Cardiovascular Disease

## 2020-05-24 NOTE — Telephone Encounter (Signed)
   Went to chart to check notes, pt is following up Dr. Gwenlyn Found referral to vascular and veins clinic

## 2020-06-04 ENCOUNTER — Encounter: Payer: Self-pay | Admitting: Surgery

## 2020-06-04 ENCOUNTER — Other Ambulatory Visit: Payer: Self-pay

## 2020-06-04 ENCOUNTER — Ambulatory Visit (INDEPENDENT_AMBULATORY_CARE_PROVIDER_SITE_OTHER): Payer: PPO | Admitting: Surgery

## 2020-06-04 VITALS — BP 137/71 | HR 63 | Temp 98.3°F | Resp 20 | Ht 64.5 in | Wt 140.6 lb

## 2020-06-04 DIAGNOSIS — I70213 Atherosclerosis of native arteries of extremities with intermittent claudication, bilateral legs: Secondary | ICD-10-CM | POA: Diagnosis not present

## 2020-06-04 MED ORDER — CILOSTAZOL 100 MG PO TABS
100.0000 mg | ORAL_TABLET | Freq: Two times a day (BID) | ORAL | 11 refills | Status: DC
Start: 2020-06-04 — End: 2020-10-17

## 2020-06-04 NOTE — Progress Notes (Signed)
Vascular and Vein Specialist of [place]  Patient name: Gregory [name] MRN: [id number] DOB: 1951/03/01 Sex: male   REQUESTING PROVIDER:    Dr. Gwenlyn Found   REASON FOR CONSULT:    Claudication  HISTORY OF PRESENT ILLNESS:   Gregory [name] is a 69 y.o. male, who is referred by Dr. Gwenlyn Found for evaluation of surgical treatment for bilateral claudication.  Most recent ABIs were in the 0.5 range.  He states that he can do approximately 15 minutes on the stationary bike.  He walks approximately 300 feet before he gets symptoms.  His symptoms are primarily buttock claudication.  He does not get calf claudication.  He does not have rest pain or ulcers.  Patient has a greater than 100-pack-year history of smoking.  He has cut down to half pack per day.  He is medically managed for hypertension.  He takes a statin for hypercholesterolemia.  Patient is status post CABG/AVR in 2020  PAST MEDICAL HISTORY    Past Medical History:  Diagnosis Date  . Alcohol abuse   . Allergy   . Aortic stenosis   . Arthritis   . Asthma   . Bipolar disorder ([place])   . CAD (coronary artery disease)    coronary calcifications 08/2013 CTA  . Carotid artery occlusion   . Chronic back pain   . Claudication ([place])   . Depression   . Family history of skin cancer   . Heart murmur   . Hepatitis    h/o 1970,doesn't remember which type,1990 epstain-barr  . Hyperlipidemia   . Hypertension   . Neuromuscular disorder ([place])   . Obsessive compulsive disorder   . PAD (peripheral artery disease) ([place])   . Peripheral neuropathy   . Pneumonia   . RBBB   . 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 Severe aortic stenosis   . Sleep apnea    does not wear CPAP  . Smoker   . Spinal stenosis at L4-L5 level   . Tobacco abuse   . Tobacco use disorder      FAMILY HISTORY   Family History  Problem Relation Age of Onset  . Stroke Father 42  . Cancer  Father        skin  . Drug abuse Father   . Hypertension Brother   . Diabetes Brother   . Drug abuse Brother   . Heart disease Brother        s/p CABG, pacemaker  . Heart failure Mother   . Cancer Maternal Aunt        skin  . Cancer Maternal Grandmother        liver    SOCIAL HISTORY:   Social History   Socioeconomic History  . Marital status: Married    Spouse name: Not on file  . Number of children: 6  . Years of education: Not on file  . Highest education level: Not on file  Occupational History  . Not on file  Tobacco Use  . Smoking status: Current Every Day Smoker    Packs/day: 0.50    Years: 25.00    Pack years: 12.50    Types: Cigarettes  . Smokeless tobacco: Never Used  . Tobacco comment: Pt had stopped smoking X10 years/ currently smoking a pack a day   Vaping Use  . Vaping Use: Never used  Substance and Sexual Activity  . Alcohol use: Yes    Alcohol/week: 78.0 standard drinks  Types: 50 Glasses of wine, 28 Standard drinks or equivalent per week    Comment: None for the past 5 years. 11/14/19  . Drug use: No    Comment: former alcoholic  . Sexual activity: Yes  Other Topics Concern  . Not on file  Social History Narrative   Lives in [place] with wife.  Does not routinely exercise.  Sedentary r/t chronic lbp.   Social Determinants of Health   Financial Resource Strain:   . Difficulty of Paying Living Expenses:   Food Insecurity:   . Worried About Charity fundraiser in the Last Year:   . Arboriculturist in the Last Year:   Transportation Needs:   . Film/video editor (Medical):   [name] Lack of Transportation (Non-Medical):   Physical Activity:   . Days of Exercise per Week:   . Minutes of Exercise per Session:   Stress:   . Feeling of Stress :   Social Connections:   . Frequency of Communication with Friends and Family:   . Frequency of Social Gatherings with Friends and Family:   . Attends Religious Services:   . Active Member of Clubs or  Organizations:   . Attends Archivist Meetings:   [name] Marital Status:   Intimate Partner Violence:   . Fear of Current or Ex-Partner:   . Emotionally Abused:   [name] Physically Abused:   . Sexually Abused:     ALLERGIES:    Allergies  Allergen Reactions  . Codeine Anaphylaxis    CURRENT MEDICATIONS:    Current Outpatient Medications  Medication Sig Dispense Refill  . acetaminophen (TYLENOL) 325 MG tablet Take 2 tablets (650 mg total) by mouth every 6 (six) hours as needed for mild pain.    [name] albuterol (PROVENTIL HFA;VENTOLIN HFA) 108 (90 Base) MCG/ACT inhaler Inhale 2 puffs into the lungs every 6 (six) hours as needed for wheezing. 3 Inhaler 1  . amLODipine (NORVASC) 10 MG tablet TAKE ONE TABLET BY MOUTH DAILY 90 tablet 0  . amoxicillin (AMOXIL) 500 MG capsule Take four capsules one hour before dental appointment. 4 capsule 1  . aspirin EC 325 MG EC tablet Take 1 tablet (325 mg total) by mouth daily. 30 tablet 0  . atorvastatin (LIPITOR) 80 MG tablet Take 1 tablet (80 mg total) by mouth daily. 90 tablet 3  . divalproex (DEPAKOTE) 500 MG DR tablet Take 500 mg by mouth at bedtime. 2-3 tabs    . finasteride (PROSCAR) 5 MG tablet TAKE ONE TABLET BY MOUTH DAILY 90 tablet 1  . fluticasone (FLONASE) 50 MCG/ACT nasal spray SPRAY 2 SPRAYS INTO THE AFFECTED NOSTRIL DAILY 48 mL 10  . Fluticasone-Salmeterol (ADVAIR DISKUS) 500-50 MCG/DOSE AEPB Inhale 1 puff into the lungs every 12 (twelve) hours. 180 each 3  . lamoTRIgine (LAMICTAL) 200 MG tablet Take 1 tablet (200 mg total) by mouth every morning. 90 tablet 3  . lisinopril-hydrochlorothiazide (ZESTORETIC) 20-12.5 MG tablet TAKE ONE TABLET BY MOUTH EVERY MORNING 90 tablet 2  . Multiple Vitamins-Minerals (MULTIVITAMIN ADULTS 50+ PO) Take 1 tablet by mouth daily.      No current facility-administered medications for this visit.    REVIEW OF SYSTEMS:   [X]  denotes positive finding, [ ]  denotes negative finding Cardiac  Comments:    Chest pain or chest pressure:    Shortness of breath upon exertion:    Short of breath when lying flat:    Irregular heart rhythm:  Vascular    Pain in calf, thigh, or hip brought on by ambulation: x   Pain in feet at night that wakes you up from your sleep:     Blood clot in your veins:    Leg swelling:         Pulmonary    Oxygen at home:    Productive cough:     Wheezing:         Neurologic    Sudden weakness in arms or legs:     Sudden numbness in arms or legs:     Sudden onset of difficulty speaking or slurred speech:    Temporary loss of vision in one eye:     Problems with dizziness:         Gastrointestinal    Blood in stool:      Vomited blood:         Genitourinary    Burning when urinating:     Blood in urine:        Psychiatric    Major depression:         Hematologic    Bleeding problems:    Problems with blood clotting too easily:        Skin    Rashes or ulcers:        Constitutional    Fever or chills:     PHYSICAL EXAM:   Vitals:   06/04/20 1109  BP: 137/71  Pulse: 63  Resp: 20  Temp: 98.3 F (36.8 C)  SpO2: 96%  Weight: 140 lb 9.6 oz (63.8 kg)  Height: 5' 4.5" (1.638 m)    GENERAL: The patient is a well-nourished male, in no acute distress. The vital signs are documented above. CARDIAC: There is a regular rate and rhythm.  VASCULAR: Nonpalpable pedal pulses PULMONARY: Nonlabored respirations ABDOMEN: Soft and non-tender with normal pitched bowel sounds.  MUSCULOSKELETAL: There are no major deformities or cyanosis. NEUROLOGIC: No focal weakness or paresthesias are detected. SKIN: There are no ulcers or rashes noted. PSYCHIATRIC: The patient has a normal affect.  STUDIES:   I have reviewed his CTA with the following findings: 1. Infrarenal aortic stenosis with RIGHT common iliac artery occlusion. Reconstituted right external iliac artery. 2. Occluded LEFT external iliac and common femoral arteries. Reconstituted  proximal left SFA.    ASSESSMENT and PLAN   Aortic atherosclerosis the patient has significant buttock claudication with relatively minimal activity.  This is consistent with the CT scan findings of iliac occlusive disease bilaterally.  We discussed that he will likely need a aortobifemoral bypass graft to alleviate his symptoms.  He is going to the beach in 3 weeks.  I am going to start him on cilostazol to see if he gets any benefit from this.  He will follow-up with me in 6 weeks for further discussions.  At that time I will also get bilateral lower extremity duplex as a CT scan stopped in the groin.   Leia Alf, MD, FACS [facility] Specialists of [facility] [phone] Pager [phone]

## 2020-06-06 ENCOUNTER — Other Ambulatory Visit: Payer: Self-pay | Admitting: *Deleted

## 2020-06-06 DIAGNOSIS — I70213 Atherosclerosis of native arteries of extremities with intermittent claudication, bilateral legs: Secondary | ICD-10-CM

## 2020-06-08 ENCOUNTER — Ambulatory Visit (INDEPENDENT_AMBULATORY_CARE_PROVIDER_SITE_OTHER): Payer: PPO | Admitting: Medical

## 2020-06-08 ENCOUNTER — Encounter: Payer: Self-pay | Admitting: Medical

## 2020-06-08 ENCOUNTER — Other Ambulatory Visit: Payer: Self-pay

## 2020-06-08 VITALS — BP 158/70 | HR 63 | Ht 64.5 in | Wt 140.6 lb

## 2020-06-08 DIAGNOSIS — W57XXXD Bitten or stung by nonvenomous insect and other nonvenomous arthropods, subsequent encounter: Secondary | ICD-10-CM

## 2020-06-08 DIAGNOSIS — R21 Rash and other nonspecific skin eruption: Secondary | ICD-10-CM | POA: Diagnosis not present

## 2020-06-08 MED ORDER — HYDROCORTISONE 2.5 % EX CREA
TOPICAL_CREAM | Freq: Two times a day (BID) | CUTANEOUS | 0 refills | Status: DC
Start: 2020-06-08 — End: 2020-10-08

## 2020-06-08 NOTE — Progress Notes (Signed)
Subjective:  Gregory [name] is a 69 y.o. male who presents for Chief Complaint  Patient presents with   Tick Removal    removed from left shoulder but still has a knot in it     Here for rash.  He saw Dr. Redmond School a month ago for tick removal.   Still has lump and redness that hasn't resolved.   Hasn't had fever, no other new symptoms.  No other aggravating or relieving factors.    No other c/o.  The following portions of the patient's history were reviewed and updated as appropriate: allergies, current medications, past family history, past medical history, past social history, past surgical history and problem list.  ROS Otherwise as in subjective above    Objective: BP (!) 158/70    Pulse 63    Ht 5' 4.5" (1.638 m)    Wt 140 lb 9.6 oz (63.8 kg)    SpO2 97%    BMI 23.76 kg/m   General appearance: alert, no distress, well developed, well nourished Skin: left posterior upper back along superior ridge of scapula with 68mm roundish nodule subcutaneous with mild pink coloration, no induration, no fluctuance, no warmth    Assessment: Encounter Diagnoses  Name Primary?   Tick bite, subsequent encounter Yes   Rash      Plan: Discussed symptoms, concerns.   Reassured.  There is just some residual skin inflammation, mild.  Can use alternating warm compresses and cool pack for a 3-4 days, can use cream below x 7-10 days.  otherwise can just leave the lesion alone.  Gregory [name] was seen today for tick removal.  Diagnoses and all orders for this visit:  Tick bite, subsequent encounter  Rash  Other orders -     hydrocortisone 2.5 % cream; Apply topically 2 (two) times daily.    Follow up: prn

## 2020-06-14 ENCOUNTER — Other Ambulatory Visit: Payer: Self-pay | Admitting: Family Medicine

## 2020-06-14 DIAGNOSIS — I10 Essential (primary) hypertension: Secondary | ICD-10-CM

## 2020-07-10 ENCOUNTER — Ambulatory Visit: Payer: PPO | Admitting: Cardiovascular Disease

## 2020-07-13 ENCOUNTER — Encounter: Payer: Self-pay | Admitting: Family Medicine

## 2020-07-13 ENCOUNTER — Telehealth (INDEPENDENT_AMBULATORY_CARE_PROVIDER_SITE_OTHER): Payer: PPO | Admitting: Family Medicine

## 2020-07-13 ENCOUNTER — Other Ambulatory Visit: Payer: Self-pay

## 2020-07-13 VITALS — Ht 64.5 in | Wt 140.0 lb

## 2020-07-13 DIAGNOSIS — J0110 Acute frontal sinusitis, unspecified: Secondary | ICD-10-CM | POA: Diagnosis not present

## 2020-07-13 MED ORDER — AMOXICILLIN 500 MG PO CAPS: ORAL_CAPSULE | ORAL | 1 refills | Status: DC

## 2020-07-13 NOTE — Progress Notes (Signed)
   Subjective:    Patient ID: Gregory [name], male    DOB: 16-Feb-1951, 69 y.o.   MRN: [id number]  HPI I connected with  Gregory [name] on 07/13/20 by a video enabled telemedicine application and verified that I am speaking with the correct person using two identifiers. I discussed the limitations of evaluation and management by telemedicine. The patient expressed understanding and agreed to proceed. He is at home alone and I am in my office. He states that on Monday he developed a sore throat it is slowly getting worse as well some postnasal drainage and now a productive cough with some eye pain, upper tooth discomfort, left greater than right as well as some left ear discomfort.  He thinks this is more than just viral infection because of the ear and upper tooth discomfort.  He has had difficulty with this in the past and did respond well to antibiotics.   Review of Systems     Objective:   Physical Exam Alert and in no distress otherwise not examined       Assessment & Plan:  Acute non-recurrent frontal sinusitis - Plan: amoxicillin (AMOXIL) 500 MG capsule I discussed the difference between cold and a bacterial infection.  Because he is having some sinus pain and earache, I think an antibiotic is appropriate.  He was comfortable with that.  He will call if further trouble.

## 2020-07-16 ENCOUNTER — Telehealth: Payer: Self-pay

## 2020-07-16 NOTE — Telephone Encounter (Signed)
Pt. Called stating he was seen Friday for a virtual for a sinus infection and amoxicillin was called in for him but it was for the wrong amount. He said only 4 were called in but it was supposed to be for a 7 or 10 day supply. He said he had some left over from a while ago and took them but is now out. If you could call in some more for him. He thinks he one that was on file that is in his chart was just refilled instead of a new script being sent in.

## 2020-07-16 NOTE — Telephone Encounter (Signed)
Pt. Called back wanting to make sure that the medicine was sent in today so he wouldn't miss a dose of the Amoxicillin. He doesn't want to get worse with his symptoms.

## 2020-07-17 ENCOUNTER — Telehealth: Payer: Self-pay | Admitting: Cardiology

## 2020-07-17 MED ORDER — AMOXICILLIN 875 MG PO TABS
875.0000 mg | ORAL_TABLET | Freq: Two times a day (BID) | ORAL | 0 refills | Status: DC
Start: 2020-07-17 — End: 2020-10-08

## 2020-07-17 NOTE — Telephone Encounter (Signed)
I have no clue how he got that regimen as that is not a dosing strength of Amoxil use.  Let them know I called in twice a day dosing.

## 2020-07-17 NOTE — Telephone Encounter (Signed)
LVM for patient to return call to get schedule with [name] from recall list

## 2020-07-17 NOTE — Telephone Encounter (Signed)
Pt called back today and said he has not received the amoxicllin. When he went to the pharmacy Friday they only gave him 4 tablets and he was supposed to have enough for a sinus infection for 7-10 days. Pt uses the [facility] on [address]

## 2020-07-18 NOTE — Telephone Encounter (Signed)
Pt was advised [facility] 

## 2020-07-23 ENCOUNTER — Ambulatory Visit: Payer: PPO | Admitting: Surgery

## 2020-07-23 ENCOUNTER — Encounter (HOSPITAL_COMMUNITY): Payer: PPO

## 2020-07-30 ENCOUNTER — Other Ambulatory Visit: Payer: Self-pay | Admitting: Family Medicine

## 2020-07-30 DIAGNOSIS — I10 Essential (primary) hypertension: Secondary | ICD-10-CM

## 2020-09-03 ENCOUNTER — Encounter: Payer: Self-pay | Admitting: Surgery

## 2020-09-03 ENCOUNTER — Other Ambulatory Visit: Payer: Self-pay

## 2020-09-03 ENCOUNTER — Ambulatory Visit (HOSPITAL_COMMUNITY)
Admission: RE | Admit: 2020-09-03 | Discharge: 2020-09-03 | Disposition: A | Discharge status: Home / Self Care | Payer: PPO | Source: Ambulatory Visit | Attending: Surgery | Admitting: Surgery

## 2020-09-03 ENCOUNTER — Ambulatory Visit (INDEPENDENT_AMBULATORY_CARE_PROVIDER_SITE_OTHER): Payer: PPO | Admitting: Surgery

## 2020-09-03 VITALS — BP 135/68 | HR 65 | Temp 98.1°F | Resp 20 | Ht 64.5 in | Wt 142.0 lb

## 2020-09-03 DIAGNOSIS — I7409 Other arterial embolism and thrombosis of abdominal aorta: Secondary | ICD-10-CM

## 2020-09-03 DIAGNOSIS — I70213 Atherosclerosis of native arteries of extremities with intermittent claudication, bilateral legs: Secondary | ICD-10-CM

## 2020-09-03 NOTE — Progress Notes (Signed)
[facility]ist of [place]  Patient name: JAIMIN [name] MRN: [id number] DOB: 01-20-1951 Sex: male   REASON FOR VISIT:    Follow up  HISOTRY OF PRESENT ILLNESS:    Gregory [name] is a 69 y.o. male, who is referred by Dr. Gwenlyn Found for evaluation of surgical treatment for bilateral claudication.  Most recent ABIs were in the 0.5 range.  He states that he can do approximately 15 minutes on the stationary bike.  He walks approximately 300 feet before he gets symptoms.  His symptoms are primarily buttock claudication.  He does not get calf claudication.  He does not have rest pain or ulcers.  This has gotten to the point that it is significantly impacting his quality of life  Patient has a greater than 100-pack-year history of smoking.  He has cut down to half pack per day.  He is medically managed for hypertension.  He takes a statin for hypercholesterolemia.  Patient is status post CABG/AVR in 2020.  He has had no prior abdominal surgeries.  PAST MEDICAL HISTORY:   Past Medical History:  Diagnosis Date  . Alcohol abuse   . Allergy   . Aortic stenosis   . Arthritis   . Asthma   . Bipolar disorder ([place])   . CAD (coronary artery disease)    coronary calcifications 08/2013 CTA  . Carotid artery occlusion   . Chronic back pain   . Claudication ([place])   . Depression   . Family history of skin cancer   . Heart murmur   . Hepatitis    h/o 1970,doesn't remember which type,1990 epstain-barr  . Hyperlipidemia   . Hypertension   . Neuromuscular disorder ([place])   . Obsessive compulsive disorder   . PAD (peripheral artery disease) ([place])   . Peripheral neuropathy   . Pneumonia   . RBBB   . 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 Severe aortic stenosis   . Sleep apnea    does not wear CPAP  . Smoker   . Spinal stenosis at L4-L5 level   . Tobacco abuse   . Tobacco use disorder      FAMILY  HISTORY:   Family History  Problem Relation Age of Onset  . Stroke Father 39  . Cancer Father        skin  . Drug abuse Father   . Hypertension Brother   . Diabetes Brother   . Drug abuse Brother   . Heart disease Brother        s/p CABG, pacemaker  . Heart failure Mother   . Cancer Maternal Aunt        skin  . Cancer Maternal Grandmother        liver    SOCIAL HISTORY:   Social History   Tobacco Use  . Smoking status: Current Every Day Smoker    Packs/day: 0.50    Years: 25.00    Pack years: 12.50    Types: Cigarettes  . Smokeless tobacco: Never Used  . Tobacco comment: Pt had stopped smoking X10 years/ currently smoking a pack a day   Substance Use Topics  . Alcohol use: Yes    Alcohol/week: 78.0 standard drinks    Types: 50 Glasses of wine, 28 Standard drinks or equivalent per week    Comment: None for the past 5 years. 11/14/19     ALLERGIES:   Allergies  Allergen Reactions  . Codeine Anaphylaxis  CURRENT MEDICATIONS:   Current Outpatient Medications  Medication Sig Dispense Refill  . acetaminophen (TYLENOL) 325 MG tablet Take 2 tablets (650 mg total) by mouth every 6 (six) hours as needed for mild pain.    [name] albuterol (PROVENTIL HFA;VENTOLIN HFA) 108 (90 Base) MCG/ACT inhaler Inhale 2 puffs into the lungs every 6 (six) hours as needed for wheezing. 3 Inhaler 1  . amLODipine (NORVASC) 10 MG tablet TAKE ONE TABLET BY MOUTH DAILY 90 tablet 0  . amoxicillin (AMOXIL) 875 MG tablet Take 1 tablet (875 mg total) by mouth 2 (two) times daily. 20 tablet 0  . aspirin EC 325 MG EC tablet Take 1 tablet (325 mg total) by mouth daily. 30 tablet 0  . atorvastatin (LIPITOR) 80 MG tablet Take 1 tablet (80 mg total) by mouth daily. 90 tablet 3  . cilostazol (PLETAL) 100 MG tablet Take 1 tablet (100 mg total) by mouth 2 (two) times daily before a meal. 60 tablet 11  . divalproex (DEPAKOTE) 500 MG DR tablet Take 500 mg by mouth at bedtime. 2-3 tabs    . finasteride  (PROSCAR) 5 MG tablet TAKE ONE TABLET BY MOUTH DAILY 90 tablet 1  . fluticasone (FLONASE) 50 MCG/ACT nasal spray SPRAY 2 SPRAYS INTO THE AFFECTED NOSTRIL DAILY 48 mL 10  . Fluticasone-Salmeterol (ADVAIR DISKUS) 500-50 MCG/DOSE AEPB Inhale 1 puff into the lungs every 12 (twelve) hours. 180 each 3  . hydrocortisone 2.5 % cream Apply topically 2 (two) times daily. (Patient not taking: Reported on 07/13/2020) 30 g 0  . lamoTRIgine (LAMICTAL) 200 MG tablet Take 1 tablet (200 mg total) by mouth every morning. 90 tablet 3  . lisinopril-hydrochlorothiazide (ZESTORETIC) 20-12.5 MG tablet TAKE ONE TABLET BY MOUTH EVERY MORNING 90 tablet 1  . Multiple Vitamins-Minerals (MULTIVITAMIN ADULTS 50+ PO) Take 1 tablet by mouth daily.      No current facility-administered medications for this visit.    REVIEW OF SYSTEMS:   [X]  denotes positive finding, [ ]  denotes negative finding Cardiac  Comments:  Chest pain or chest pressure:    Shortness of breath upon exertion:    Short of breath when lying flat:    Irregular heart rhythm:        Vascular    Pain in calf, thigh, or hip brought on by ambulation: x   Pain in feet at night that wakes you up from your sleep:  x   Blood clot in your veins:    Leg swelling:         Pulmonary    Oxygen at home:    Productive cough:     Wheezing:         Neurologic    Sudden weakness in arms or legs:     Sudden numbness in arms or legs:     Sudden onset of difficulty speaking or slurred speech:    Temporary loss of vision in one eye:     Problems with dizziness:         Gastrointestinal    Blood in stool:     Vomited blood:         Genitourinary    Burning when urinating:     Blood in urine:        Psychiatric    Major depression:         Hematologic    Bleeding problems:    Problems with blood clotting too easily:        Skin    Rashes  or ulcers:        Constitutional    Fever or chills:      PHYSICAL EXAM:   There were no vitals filed for  this visit.  GENERAL: The patient is a well-nourished male, in no acute distress. The vital signs are documented above. CARDIAC: There is a regular rate and rhythm.  VASCULAR: non palpable pedal pulses PULMONARY: Non-labored respirations ABDOMEN: Soft and non-tender with normal pitched bowel sounds.  MUSCULOSKELETAL: There are no major deformities or cyanosis. NEUROLOGIC: No focal weakness or paresthesias are detected. SKIN: There are no ulcers or rashes noted. PSYCHIATRIC: The patient has a normal affect.  STUDIES:   I have reviewed the following:  Carotid:  1-39% stenosis CTA: 1. Infrarenal aortic stenosis with RIGHT common iliac artery occlusion. Reconstituted right external iliac artery. 2. Occluded LEFT external iliac and common femoral arteries. Reconstituted proximal left SFA.  MEDICAL ISSUES:   Leriche syndrome: The patient has significant buttock claudication with minimal ambulation.  CT scan shows distal aortic and iliac occlusion.  We discussed proceeding with an aortobifemoral bypass graft.  I discussed the details of the procedure as well as the risks and benefits which include but are not limited to the risk of intestinal ischemia, renal insufficiency, cardiac complications, hernia, wound complications particularly in the groin.  All of their questions were answered.  We will try to get this scheduled in early October.    Leia Alf, MD, FACS [facility] [phone] Pager [phone]

## 2020-09-03 NOTE — H&P (View-Only) (Signed)
Vascular and Vein Specialist of [place]  Patient name: Gregory [name] MRN: [id number] DOB: 28-Aug-1951 Sex: male   REASON FOR VISIT:    Follow up  HISOTRY OF PRESENT ILLNESS:    Gregory [name] is a 69 y.o. male, who is referred by Dr. Gwenlyn Found for evaluation of surgical treatment for bilateral claudication.  Most recent ABIs were in the 0.5 range.  He states that he can do approximately 15 minutes on the stationary bike.  He walks approximately 300 feet before he gets symptoms.  His symptoms are primarily buttock claudication.  He does not get calf claudication.  He does not have rest pain or ulcers.  This has gotten to the point that it is significantly impacting his quality of life  Patient has a greater than 100-pack-year history of smoking.  He has cut down to half pack per day.  He is medically managed for hypertension.  He takes a statin for hypercholesterolemia.  Patient is status post CABG/AVR in 2020.  He has had no prior abdominal surgeries.  PAST MEDICAL HISTORY:   Past Medical History:  Diagnosis Date  . Alcohol abuse   . Allergy   . Aortic stenosis   . Arthritis   . Asthma   . Bipolar disorder ([place])   . CAD (coronary artery disease)    coronary calcifications 08/2013 CTA  . Carotid artery occlusion   . Chronic back pain   . Claudication ([place])   . Depression   . Family history of skin cancer   . Heart murmur   . Hepatitis    h/o 1970,doesn't remember which type,1990 epstain-barr  . Hyperlipidemia   . Hypertension   . Neuromuscular disorder ([place])   . Obsessive compulsive disorder   . PAD (peripheral artery disease) ([place])   . Peripheral neuropathy   . Pneumonia   . RBBB   . 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 Severe aortic stenosis   . Sleep apnea    does not wear CPAP  . Smoker   . Spinal stenosis at L4-L5 level   . Tobacco abuse   . Tobacco use disorder      FAMILY  HISTORY:   Family History  Problem Relation Age of Onset  . Stroke Father 54  . Cancer Father        skin  . Drug abuse Father   . Hypertension Brother   . Diabetes Brother   . Drug abuse Brother   . Heart disease Brother        s/p CABG, pacemaker  . Heart failure Mother   . Cancer Maternal Aunt        skin  . Cancer Maternal Grandmother        liver    SOCIAL HISTORY:   Social History   Tobacco Use  . Smoking status: Current Every Day Smoker    Packs/day: 0.50    Years: 25.00    Pack years: 12.50    Types: Cigarettes  . Smokeless tobacco: Never Used  . Tobacco comment: Pt had stopped smoking X10 years/ currently smoking a pack a day   Substance Use Topics  . Alcohol use: Yes    Alcohol/week: 78.0 standard drinks    Types: 50 Glasses of wine, 28 Standard drinks or equivalent per week    Comment: None for the past 5 years. 11/14/19     ALLERGIES:   Allergies  Allergen Reactions  . Codeine Anaphylaxis  CURRENT MEDICATIONS:   Current Outpatient Medications  Medication Sig Dispense Refill  . acetaminophen (TYLENOL) 325 MG tablet Take 2 tablets (650 mg total) by mouth every 6 (six) hours as needed for mild pain.    [name] albuterol (PROVENTIL HFA;VENTOLIN HFA) 108 (90 Base) MCG/ACT inhaler Inhale 2 puffs into the lungs every 6 (six) hours as needed for wheezing. 3 Inhaler 1  . amLODipine (NORVASC) 10 MG tablet TAKE ONE TABLET BY MOUTH DAILY 90 tablet 0  . amoxicillin (AMOXIL) 875 MG tablet Take 1 tablet (875 mg total) by mouth 2 (two) times daily. 20 tablet 0  . aspirin EC 325 MG EC tablet Take 1 tablet (325 mg total) by mouth daily. 30 tablet 0  . atorvastatin (LIPITOR) 80 MG tablet Take 1 tablet (80 mg total) by mouth daily. 90 tablet 3  . cilostazol (PLETAL) 100 MG tablet Take 1 tablet (100 mg total) by mouth 2 (two) times daily before a meal. 60 tablet 11  . divalproex (DEPAKOTE) 500 MG DR tablet Take 500 mg by mouth at bedtime. 2-3 tabs    . finasteride  (PROSCAR) 5 MG tablet TAKE ONE TABLET BY MOUTH DAILY 90 tablet 1  . fluticasone (FLONASE) 50 MCG/ACT nasal spray SPRAY 2 SPRAYS INTO THE AFFECTED NOSTRIL DAILY 48 mL 10  . Fluticasone-Salmeterol (ADVAIR DISKUS) 500-50 MCG/DOSE AEPB Inhale 1 puff into the lungs every 12 (twelve) hours. 180 each 3  . hydrocortisone 2.5 % cream Apply topically 2 (two) times daily. (Patient not taking: Reported on 07/13/2020) 30 g 0  . lamoTRIgine (LAMICTAL) 200 MG tablet Take 1 tablet (200 mg total) by mouth every morning. 90 tablet 3  . lisinopril-hydrochlorothiazide (ZESTORETIC) 20-12.5 MG tablet TAKE ONE TABLET BY MOUTH EVERY MORNING 90 tablet 1  . Multiple Vitamins-Minerals (MULTIVITAMIN ADULTS 50+ PO) Take 1 tablet by mouth daily.      No current facility-administered medications for this visit.    REVIEW OF SYSTEMS:   [X]  denotes positive finding, [ ]  denotes negative finding Cardiac  Comments:  Chest pain or chest pressure:    Shortness of breath upon exertion:    Short of breath when lying flat:    Irregular heart rhythm:        Vascular    Pain in calf, thigh, or hip brought on by ambulation: x   Pain in feet at night that wakes you up from your sleep:  x   Blood clot in your veins:    Leg swelling:         Pulmonary    Oxygen at home:    Productive cough:     Wheezing:         Neurologic    Sudden weakness in arms or legs:     Sudden numbness in arms or legs:     Sudden onset of difficulty speaking or slurred speech:    Temporary loss of vision in one eye:     Problems with dizziness:         Gastrointestinal    Blood in stool:     Vomited blood:         Genitourinary    Burning when urinating:     Blood in urine:        Psychiatric    Major depression:         Hematologic    Bleeding problems:    Problems with blood clotting too easily:        Skin    Rashes  or ulcers:        Constitutional    Fever or chills:      PHYSICAL EXAM:   There were no vitals filed for  this visit.  GENERAL: The patient is a well-nourished male, in no acute distress. The vital signs are documented above. CARDIAC: There is a regular rate and rhythm.  VASCULAR: non palpable pedal pulses PULMONARY: Non-labored respirations ABDOMEN: Soft and non-tender with normal pitched bowel sounds.  MUSCULOSKELETAL: There are no major deformities or cyanosis. NEUROLOGIC: No focal weakness or paresthesias are detected. SKIN: There are no ulcers or rashes noted. PSYCHIATRIC: The patient has a normal affect.  STUDIES:   I have reviewed the following:  Carotid:  1-39% stenosis CTA: 1. Infrarenal aortic stenosis with RIGHT common iliac artery occlusion. Reconstituted right external iliac artery. 2. Occluded LEFT external iliac and common femoral arteries. Reconstituted proximal left SFA.  MEDICAL ISSUES:   Leriche syndrome: The patient has significant buttock claudication with minimal ambulation.  CT scan shows distal aortic and iliac occlusion.  We discussed proceeding with an aortobifemoral bypass graft.  I discussed the details of the procedure as well as the risks and benefits which include but are not limited to the risk of intestinal ischemia, renal insufficiency, cardiac complications, hernia, wound complications particularly in the groin.  All of their questions were answered.  We will try to get this scheduled in early October.    Leia Alf, MD, FACS [facility] and Vein Specialists of [facility] [phone] Pager [phone]

## 2020-09-05 ENCOUNTER — Other Ambulatory Visit: Payer: Self-pay | Admitting: *Deleted

## 2020-09-05 ENCOUNTER — Encounter: Payer: Self-pay | Admitting: *Deleted

## 2020-09-11 ENCOUNTER — Telehealth: Payer: Self-pay

## 2020-09-11 NOTE — Telephone Encounter (Signed)
Pt inquired since he's had a previous AVR and takes ABX prior to procedures, whether he needed it for his upcoming Aortobifem on 10/03/20 with Dr. Trula Slade. Advised pt that he'll get IV ABX prior to procedure.   Pt also inquired whether he should take the Covid-19 Booster shot prior. Discussed with pt if he chose to take vaccine, wouldn't suggest immediately prior to surgery date due to potential side effects afterwards that could lead to surgery cancellation.    Pt verbalized understanding to all recommendations.

## 2020-09-24 ENCOUNTER — Telehealth: Payer: Self-pay

## 2020-09-24 NOTE — Telephone Encounter (Signed)
Patient called with post surgery questions regarding scheduled aorto-bi fem with [facility] on 10/13. Has a conference (mostly sitting per patient) scheduled for 2 weeks after surgery and another conference scheduled for 4 weeks after surgery (mostly sitting per patient).Patient informed of guidelines of 6 weeks out of work. Recommended to find a replacement for first conference and give himself more time to rest and recover. Also suggested to find replacement for second conference. Instructed to ask MD about possibly attending second conference on his 2 week f/u after surgery.  Patient verbalized understanding.

## 2020-09-25 NOTE — Pre-Procedure Instructions (Signed)
Your procedure is scheduled on Wednesday, October 13, from 08:30 AM- 2:17 PM.  Report to [facility] Main Entrance "A" at 06:30 A.M., and check in at the Admitting office.  Call this number if you have problems the morning of surgery:  [phone]  Call [phone] if you have any questions prior to your surgery date Monday-Friday 8am-4pm.    Remember:  Do not eat or drink after midnight the night before your surgery.     Take these medicines the morning of surgery with A SIP OF WATER: amLODipine (NORVASC) atorvastatin (LIPITOR) finasteride (PROSCAR) lamoTRIgine (LAMICTAL)  Fluticasone-Salmeterol (ADVAIR DISKUS) inhaler  IF NEEDED: acetaminophen (TYLENOL) fluticasone (FLONASE) nasal spray albuterol (PROVENTIL HFA;VENTOLIN HFA) inhaler- bring with you the day of surgery   *Follow your surgeon's instructions on when to stop Aspirin and cilostazol (PLETAL).  If no instructions were given by your surgeon then you will need to call the office to get those instructions.     As of today, STOP taking any, Aleve, Naproxen, Ibuprofen, Motrin, Advil, Goody's, BC's, all herbal medications, fish oil, and all vitamins.       The Morning of Surgery:                Do not wear jewelry.            Do not wear lotions, powders, colognes, or deodorant.            Men may shave face and neck.            Do not bring valuables to the hospital.            [facility] is not responsible for any belongings or valuables.  Do NOT Smoke (Tobacco/Vaping) or drink Alcohol 24 hours prior to your procedure.  If you use a CPAP at night, you may bring all equipment for your overnight stay.   Contacts, glasses, dentures or bridgework may not be worn into surgery.      For patients admitted to the hospital, discharge time will be determined by your treatment team.   Patients discharged the day of surgery will not be allowed to drive home, and someone needs to stay with them for 24  hours.    Special instructions:   [place]- Preparing For Surgery  Before surgery, you can play an important role. Because skin is not sterile, your skin needs to be as free of germs as possible. You can reduce the number of germs on your skin by washing with CHG (chlorahexidine gluconate) Soap before surgery.  CHG is an antiseptic cleaner which kills germs and bonds with the skin to continue killing germs even after washing.    Oral Hygiene is also important to reduce your risk of infection.  Remember - BRUSH YOUR TEETH THE MORNING OF SURGERY WITH YOUR REGULAR TOOTHPASTE  Please do not use if you have an allergy to CHG or antibacterial soaps. If your skin becomes reddened/irritated stop using the CHG.  Do not shave (including legs and underarms) for at least 48 hours prior to first CHG shower. It is OK to shave your face.  Please follow these instructions carefully.   1. Shower the NIGHT BEFORE SURGERY and the MORNING OF SURGERY with CHG Soap.   2. If you chose to wash your hair, wash your hair first as usual with your normal shampoo.  3. After you shampoo, rinse your hair and body thoroughly to remove the shampoo.  4. Use CHG as you would  any other liquid soap. You can apply CHG directly to the skin and wash gently with a scrungie or a clean washcloth.   5. Apply the CHG Soap to your body ONLY FROM THE NECK DOWN.  Do not use on open wounds or open sores. Avoid contact with your eyes, ears, mouth and genitals (private parts). Wash Face and genitals (private parts)  with your normal soap.   6. Wash thoroughly, paying special attention to the area where your surgery will be performed.  7. Thoroughly rinse your body with warm water from the neck down.  8. DO NOT shower/wash with your normal soap after using and rinsing off the CHG Soap.  9. Pat yourself dry with a CLEAN TOWEL.  10. Wear CLEAN PAJAMAS to bed the night before surgery  11. Place CLEAN SHEETS on your bed the night of  your first shower and DO NOT SLEEP WITH PETS.   Day of Surgery: SHOWER Wear Clean/Comfortable clothing the morning of surgery Do not apply any deodorants/lotions.   Remember to brush your teeth WITH YOUR REGULAR TOOTHPASTE.   Please read over the following fact sheets that you were given.

## 2020-09-26 ENCOUNTER — Encounter (HOSPITAL_COMMUNITY): Payer: Self-pay

## 2020-09-26 ENCOUNTER — Other Ambulatory Visit: Payer: Self-pay

## 2020-09-26 ENCOUNTER — Encounter (HOSPITAL_COMMUNITY)
Admission: RE | Admit: 2020-09-26 | Discharge: 2020-09-26 | Disposition: A | Discharge status: Home / Self Care | Payer: PPO | Source: Ambulatory Visit | Attending: Surgery | Admitting: Surgery

## 2020-09-26 DIAGNOSIS — Z01812 Encounter for preprocedural laboratory examination: Secondary | ICD-10-CM | POA: Diagnosis not present

## 2020-09-26 LAB — CBC
HCT: 46.7 % (ref 39.0–52.0)
Hemoglobin: 15.8 g/dL (ref 13.0–17.0)
MCH: 31.2 pg (ref 26.0–34.0)
MCHC: 33.8 g/dL (ref 30.0–36.0)
MCV: 92.1 fL (ref 80.0–100.0)
Platelets: 145 10*3/uL — ABNORMAL LOW (ref 150–400)
RBC: 5.07 MIL/uL (ref 4.22–5.81)
RDW: 14 % (ref 11.5–15.5)
WBC: 5.7 10*3/uL (ref 4.0–10.5)
nRBC: 0 % (ref 0.0–0.2)

## 2020-09-26 LAB — COMPREHENSIVE METABOLIC PANEL
ALT: 51 U/L — ABNORMAL HIGH (ref 0–44)
AST: 33 U/L (ref 15–41)
Albumin: 4.5 g/dL (ref 3.5–5.0)
Alkaline Phosphatase: 50 U/L (ref 38–126)
Anion gap: 12 (ref 5–15)
BUN: 13 mg/dL (ref 8–23)
CO2: 26 mmol/L (ref 22–32)
Calcium: 9.5 mg/dL (ref 8.9–10.3)
Chloride: 100 mmol/L (ref 98–111)
Creatinine, Ser: 0.87 mg/dL (ref 0.61–1.24)
GFR calc non Af Amer: >60 mL/min (ref >60)
Glucose, Bld: 98 mg/dL (ref 70–99)
Potassium: 4.1 mmol/L (ref 3.5–5.1)
Sodium: 138 mmol/L (ref 135–145)
Total Bilirubin: 0.9 mg/dL (ref 0.3–1.2)
Total Protein: 7.9 g/dL (ref 6.5–8.1)

## 2020-09-26 LAB — URINALYSIS, ROUTINE W REFLEX MICROSCOPIC
Bilirubin Urine: NEGATIVE
Glucose, UA: NEGATIVE mg/dL
Hgb urine dipstick: NEGATIVE
Ketones, ur: NEGATIVE mg/dL
Leukocytes,Ua: NEGATIVE
Nitrite: NEGATIVE
Protein, ur: NEGATIVE mg/dL
Specific Gravity, Urine: 1.011 (ref 1.005–1.030)
pH: 7 (ref 5.0–8.0)

## 2020-09-26 LAB — TYPE AND SCREEN
ABO/RH(D): AB POS
Antibody Screen: NEGATIVE

## 2020-09-26 LAB — SURGICAL PCR SCREEN
MRSA, PCR: NEGATIVE
Staphylococcus aureus: NEGATIVE

## 2020-09-26 LAB — PROTIME-INR
INR: 1 (ref 0.8–1.2)
Prothrombin Time: 13.1 seconds (ref 11.4–15.2)

## 2020-09-26 LAB — APTT: aPTT: 41 seconds — ABNORMAL HIGH (ref 24–36)

## 2020-09-26 NOTE — Progress Notes (Signed)
Abnormal lab value at pre-admission appointment  PTT - 41  Dr. Trula Slade notified.  Chart sent to anesthesia for review.

## 2020-09-26 NOTE — Progress Notes (Signed)
PCP - [name] Cardiologist - Dr. [name]  Chest x-ray - 01-04-20 EKG - 12-08-19 Stress Test - 09-29-13 ECHO - 12-13-19 Cardiac Cath - 11-10-19  SA - yes, does not wear CPAP  Blood Thinner Instructions: Follow your surgeon's instructions on when to stop Pletal & ASA. Patient stated understanding and will follow up with Dr. Stephens [place]ire office.   COVID TEST- Monday, Oct 11   Anesthesia review: yes, heart history  Patient denies shortness of breath, fever, cough and chest pain at PAT appointment   All instructions explained to the patient, with a verbal understanding of the material. Patient agrees to go over the instructions while at home for a better understanding. Patient also instructed to self quarantine after being tested for COVID-19. The opportunity to ask questions was provided.

## 2020-09-27 ENCOUNTER — Telehealth: Payer: Self-pay

## 2020-09-27 NOTE — Progress Notes (Signed)
Anesthesia Chart Review:  Case: [id number] Date/Time: 10/03/20 0815   Procedure: AORTA BIFEMORAL BYPASS GRAFT (N/A )   Anesthesia type: General   Pre-op diagnosis: PAD   Location: [facility] OR ROOM 12 / [place] [facility]   Surgeons: Serafina Mitchell, MD      DISCUSSION: Patient is a 69 year old male scheduled for the above procedure. He was referred to Dr. Trula Slade by cardiologist Dr. Gwenlyn Found.   History includes smoking, HTN, Bipolar disorder, OCD, alcohol abuse (sober x 5 year as of 11/14/19), asthma, HLD, CAD (s/p CABG/pericardial AVR: LIMA-LAD 11/24/19), murmur/aortic stenosis (s/p CABG/AVR with 23 mm pericardial tissue AVR 11/24/19), right BBB, carotid artery disease (1-39% BICA stenosis, ? Right SCA proximal obstruction 11/22/19 [place]), PAD, peripheral neuropathy, hepatitis (1970, unsure what type), OSA (does not wear CPAP), L4-5 PLIF (10/25/14).  Seen by Almyra Deforest, [place] 02/29/20 for cardiology follow-up and was referred to Dr. Gwenlyn Found for PAD/claudication. He noted to AVOID AV nodal blocking agents as patient's early post-CABG/AVR course was complicated by complete heart block that eventually resolved. No significant arrhyhtmia or AV block on subsequent 11/2019 Zio monitor.   He is on ASA and Pletal. He wasn't sure if he was to hold Pletal, so staff message sent to Dr. Trula Slade for his staff to clarify with patient. There is now a [facility] RN note indicating he is to continue ASA and Pletal. Note suggest he told her he was on clindamycin per his dentist (09/25/20 x 7 days). Dr. Trula Slade also notified of PTT 41.     Preoperative COVID-19 test is scheduled for 10/01/2020.  Anesthesia team to evaluate on the day of surgery.   VS: BP 135/69   Pulse (!) 59   Temp 36.7 C (Oral)   Resp 18   Ht 5' 5.5" (1.664 m)   Wt 64.5 kg   SpO2 99%   BMI 23.29 kg/m     PROVIDERS: [name], MD is PCP  [name], Peter, MD is cardiologist Quay Burow, MD is [facility] cardiologist   LABS: Preoperative labs noted.  (all labs ordered are  listed, but only abnormal results are displayed)  Labs Reviewed  APTT - Abnormal; Notable for the following components:      Result Value   aPTT 41 (*)    All other components within normal limits  CBC - Abnormal; Notable for the following components:   Platelets 145 (*)    All other components within normal limits  COMPREHENSIVE METABOLIC PANEL - Abnormal; Notable for the following components:   ALT 51 (*)    All other components within normal limits  SURGICAL PCR SCREEN  PROTIME-INR  URINALYSIS, ROUTINE W REFLEX MICROSCOPIC  TYPE AND SCREEN    IMAGES: CTA abd/pelvis 05/01/20: IMPRESSION: 1. Infrarenal aortic stenosis with RIGHT common iliac artery occlusion. Reconstituted right external iliac artery. 2. Occluded LEFT external iliac and common femoral arteries. Reconstituted proximal left SFA. - Aortic Atherosclerosis (ICD10-I70.0).  CXR 01/04/20: FINDINGS: The heart size and mediastinal contours are within normal limits. Status post aortic valve repair. No pneumothorax or pleural effusion is noted. Both lungs are clear. The visualized skeletal structures are unremarkable. IMPRESSION: No active cardiopulmonary disease.   EKG: 12/08/19: Normal sinus rhythm.  Right bundle branch block.  T wave abnormality, consider lateral ischemia.   CV: Long term Cardiac monitor 11/30/19-12/14/19: Study Highlights  Normal sinus rhythm  Rare isolated PVCs and PACs   Echo 12/13/19: IMPRESSIONS  1. Left ventricular ejection fraction, by visual estimation, is 70 to  75%. The  left ventricle has normal function. There is no left ventricular  hypertrophy.  2. The left ventricle has no regional wall motion abnormalities.  3. Global right ventricle has normal systolic function.The right  ventricular size is normal. No increase in right ventricular wall  thickness.  4. Left atrial size was normal.  5. Right atrial size was normal.  6. The mitral valve is normal in structure. No  evidence of mitral valve  regurgitation. No evidence of mitral stenosis.  7. The tricuspid valve is normal in structure. Tricuspid valve  regurgitation is trivial.  8. Aortic valve regurgitation is not visualized.  9. Aortic Valve Replacement-43mm Biosprosthetic Peak velocity 2.33m/s,  mean 5mmHg.  10. The pulmonic valve was normal in structure. Pulmonic valve  regurgitation is not visualized.  11. Mildly elevated pulmonary artery systolic pressure.  12. The inferior vena cava is normal in size with greater than 50%  respiratory variability, suggesting right atrial pressure of 3 mmHg.    Carotid US 11/22/19: Summary:  - Right Carotid: Velocities in the right ICA are consistent with a 1-39% stenosis.  - Left Carotid: Velocities in the left ICA are consistent with a 1-39% stenosis.  - Vertebrals: Bilateral vertebral arteries demonstrate antegrade flow.  - Subclavians: Normal flow hemodynamics were seen in the left subclavian artery. Right subclavian artery exhibits antegrade flow with elevated velocities. This finding, along with the right brachial artery pressure being 33mmHg lower than the left brachial artery pressure, is suggestive of proximal obstruction.    RHC/LHC 11/10/19 (PRE-CABG/AVR):  Prox LAD to Mid LAD lesion is 99% stenosed.  Mid Cx to Dist Cx lesion is 50% stenosed.  Mid RCA lesion is 30% stenosed.  The left ventricular systolic function is normal.  LV end diastolic pressure is normal.  The left ventricular ejection fraction is 55-65% by visual estimate.  There is severe aortic valve stenosis. 1. Critical single vessel obstructive CAD with 99% mid LAD - heavily calcified 2. Severe aortic stenosis. Mean gradient 47 mm Hg. AVA 0.68 cm squared with index 0.39. 3. Normal LV filling pressures 4. Normal Right heart pressures 5. Normal cardiac output. Plan: referral to CT surgery for combined AVR and CABG.  - S/p CABG/pericardial AVR: LIMA-LAD, 23 mm pericardial  tissue AVR 11/24/19   Past Medical History:  Diagnosis Date  . Alcohol abuse   . Allergy   . Aortic stenosis   . Arthritis   . Asthma   . Bipolar disorder ([place])   . CAD (coronary artery disease)    coronary calcifications 08/2013 CTA  . Carotid artery occlusion   . Chronic back pain   . Claudication ([place])   . Depression   . Family history of skin cancer   . Heart murmur   . Hepatitis    h/o 1970,doesn't remember which type,1990 epstain-barr  . Hyperlipidemia   . Hypertension   . Neuromuscular disorder ([place])   . Obsessive compulsive disorder   . PAD (peripheral artery disease) ([place])   . Peripheral neuropathy   . Pneumonia   . RBBB   . 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 Severe aortic stenosis   . Sleep apnea    does not wear CPAP  . Smoker   . Spinal stenosis at L4-L5 level   . Tobacco abuse   . Tobacco use disorder     Past Surgical History:  Procedure Laterality Date  . ANAL FISTULECTOMY  09/25/11  . AORTIC VALVE REPLACEMENT N/A 11/24/2019  Procedure: AORTIC VALVE REPLACEMENT (AVR) using INSPIRIS Resilia 23 MM Bioprosthetic Aortic Valve.;  Surgeon: Gaye Pollack, MD;  Location: [facility];  Service: Open Heart Surgery;  Laterality: N/A;  . COLONOSCOPY  2009  . CORONARY ARTERY BYPASS GRAFT N/A 11/24/2019   Procedure: CORONARY ARTERY BYPASS GRAFTING (CABG) using LIMA to LAD.;  Surgeon: Gaye Pollack, MD;  Location: [facility];  Service: Open Heart Surgery;  Laterality: N/A;  . ELBOW SURGERY     bilaterally for cubital tunnel  . HERNIA REPAIR     with mesh  . MAXIMUM ACCESS (MAS)POSTERIOR LUMBAR INTERBODY FUSION (PLIF) 1 LEVEL N/A 10/25/2014   Procedure: LUMBAR FOUR TO FIVE MAXIMUM ACCESS (MAS) POSTERIOR LUMBAR INTERBODY FUSION (PLIF) 1 LEVEL;  Surgeon: Eustace Moore, MD;  Location: [place] [facility];  Service: Neurosurgery;  Laterality: N/A;  . RIGHT/LEFT HEART CATH AND CORONARY ANGIOGRAPHY N/A 11/10/2019   Procedure: RIGHT/LEFT HEART CATH AND  CORONARY ANGIOGRAPHY;  Surgeon: [name], [name], MD;  Location: [place] CV LAB;  Service: Cardiovascular;  Laterality: N/A;  . 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 TEE WITHOUT CARDIOVERSION N/A 11/24/2019   Procedure: TRANSESOPHAGEAL ECHOCARDIOGRAM (TEE);  Surgeon: Gaye Pollack, MD;  Location: [place];  Service: Open Heart Surgery;  Laterality: N/A;  . TONSILLECTOMY  age 64  . VASECTOMY     X 2  . VASECTOMY REVERSAL      MEDICATIONS: . clindamycin (CLEOCIN) 300 MG capsule  . acetaminophen (TYLENOL) 325 MG tablet  . albuterol (PROVENTIL HFA;VENTOLIN HFA) 108 (90 Base) MCG/ACT inhaler  . amLODipine (NORVASC) 10 MG tablet  . amoxicillin (AMOXIL) 500 MG capsule  . amoxicillin (AMOXIL) 875 MG tablet  . aspirin EC 325 MG EC tablet  . atorvastatin (LIPITOR) 80 MG tablet  . cilostazol (PLETAL) 100 MG tablet  . divalproex (DEPAKOTE) 500 MG DR tablet  . finasteride (PROSCAR) 5 MG tablet  . fluticasone (FLONASE) 50 MCG/ACT nasal spray  . Fluticasone-Salmeterol (ADVAIR DISKUS) 500-50 MCG/DOSE AEPB  . hydrocortisone 2.5 % cream  . lamoTRIgine (LAMICTAL) 200 MG tablet  . lisinopril-hydrochlorothiazide (ZESTORETIC) 20-12.5 MG tablet  . Multiple Vitamins-Minerals (MULTIVITAMIN ADULTS 50+ PO)   No current facility-administered medications for this encounter.    Myra Gianotti, PA-C Surgical Short Stay/Anesthesiology [facility] Phone [phone] [facility] Phone [phone] 09/27/2020 2:57 PM

## 2020-09-27 NOTE — Anesthesia Preprocedure Evaluation (Addendum)
Anesthesia Evaluation  Patient identified by MRN, date of birth, ID band Patient awake    Reviewed: Allergy & Precautions, NPO status , Patient's Chart, lab work & pertinent test results  Airway Mallampati: III  TM Distance: >3 FB Neck ROM: Full    Dental no notable dental hx.    Pulmonary asthma , sleep apnea , Current SmokerPatient did not abstain from smoking.,    Pulmonary exam normal breath sounds clear to auscultation       Cardiovascular hypertension, Pt. on medications + CAD, + CABG and + Peripheral Vascular Disease  Normal cardiovascular exam+ Valvular Problems/Murmurs (s/p AVR)  Rhythm:Regular Rate:Normal  ECHO: 1. Left ventricular ejection fraction, by visual estimation, is 70 to 75%. The left ventricle has normal function. There is no left ventricular hypertrophy. 2. The left ventricle has no regional wall motion abnormalities. 3. Global right ventricle has normal systolic function.The right ventricular size is normal. No increase in right ventricular wall thickness. 4. Left atrial size was normal. 5. Right atrial size was normal. 6. The mitral valve is normal in structure. No evidence of mitral valve regurgitation. No evidence of mitral stenosis. 7. The tricuspid valve is normal in structure. Tricuspid valve regurgitation is trivial. 8. Aortic valve regurgitation is not visualized. 9. Aortic Valve Replacement-34mm Biosprosthetic Peak velocity 2.70m/s, mean 63mmHg. 10. The pulmonic valve was normal in structure. Pulmonic valve regurgitation is not visualized. 11. Mildly elevated pulmonary artery systolic pressure. 12. The inferior vena cava is normal in size with greater than 50% respiratory variability, suggesting right atrial pressure of 3 mmHg.   Neuro/Psych PSYCHIATRIC DISORDERS Anxiety Depression Bipolar Disorder negative neurological ROS     GI/Hepatic negative GI ROS, (+) Hepatitis -  Endo/Other  negative  endocrine ROS  Renal/GU negative Renal ROS     Musculoskeletal  (+) Arthritis , Chronic back pain   Abdominal   Peds  Hematology negative hematology ROS (+)   Anesthesia Other Findings PAD  Reproductive/Obstetrics                           Anesthesia Physical Anesthesia Plan  ASA: III  Anesthesia Plan: General   Post-op Pain Management:    Induction: Intravenous  PONV Risk Score and Plan: 1 and Ondansetron, Dexamethasone, Midazolam and Treatment may vary due to age or medical condition  Airway Management Planned: Oral ETT  Additional Equipment: Arterial line, CVP and Ultrasound Guidance Line Placement  Intra-op Plan:   Post-operative Plan:   Informed Consent: I have reviewed the patients History and Physical, chart, labs and discussed the procedure including the risks, benefits and alternatives for the proposed anesthesia with the patient or authorized representative who has indicated his/her understanding and acceptance.     Dental advisory given  Plan Discussed with: CRNA  Anesthesia Plan Comments: (Reviewed PAT note written 09/27/2020 by Myra Gianotti, PA-C. )      Anesthesia Quick Evaluation

## 2020-09-27 NOTE — Telephone Encounter (Signed)
Patient called with questions about medications to take/hold prior to surgery. Instructed patient to continue taking all medications (including dentist prescribed antibiotics for inflammation, pletal, ASA and his BP meds) Told to stop multivitamins. Patient verbalized understanding.

## 2020-10-01 ENCOUNTER — Other Ambulatory Visit: Payer: Self-pay

## 2020-10-01 ENCOUNTER — Telehealth: Payer: Self-pay

## 2020-10-01 ENCOUNTER — Other Ambulatory Visit (HOSPITAL_COMMUNITY)
Admission: RE | Admit: 2020-10-01 | Discharge: 2020-10-01 | Disposition: A | Discharge status: Home / Self Care | Payer: PPO | Source: Ambulatory Visit | Attending: Surgery | Admitting: Surgery

## 2020-10-01 DIAGNOSIS — Z809 Family history of malignant neoplasm, unspecified: Secondary | ICD-10-CM | POA: Diagnosis not present

## 2020-10-01 DIAGNOSIS — I7419 Embolism and thrombosis of other parts of aorta: Secondary | ICD-10-CM | POA: Diagnosis present

## 2020-10-01 DIAGNOSIS — I10 Essential (primary) hypertension: Secondary | ICD-10-CM | POA: Diagnosis present

## 2020-10-01 DIAGNOSIS — Z813 Family history of other psychoactive substance abuse and dependence: Secondary | ICD-10-CM | POA: Diagnosis not present

## 2020-10-01 DIAGNOSIS — Z20822 Contact with and (suspected) exposure to covid-19: Secondary | ICD-10-CM | POA: Diagnosis present

## 2020-10-01 DIAGNOSIS — Z8249 Family history of ischemic heart disease and other diseases of the circulatory system: Secondary | ICD-10-CM | POA: Diagnosis not present

## 2020-10-01 DIAGNOSIS — Z7951 Long term (current) use of inhaled steroids: Secondary | ICD-10-CM | POA: Diagnosis not present

## 2020-10-01 DIAGNOSIS — I350 Nonrheumatic aortic (valve) stenosis: Secondary | ICD-10-CM | POA: Diagnosis not present

## 2020-10-01 DIAGNOSIS — Z833 Family history of diabetes mellitus: Secondary | ICD-10-CM | POA: Diagnosis not present

## 2020-10-01 DIAGNOSIS — I7409 Other arterial embolism and thrombosis of abdominal aorta: Secondary | ICD-10-CM | POA: Diagnosis not present

## 2020-10-01 DIAGNOSIS — D62 Acute posthemorrhagic anemia: Secondary | ICD-10-CM | POA: Diagnosis not present

## 2020-10-01 DIAGNOSIS — I745 Embolism and thrombosis of iliac artery: Secondary | ICD-10-CM | POA: Diagnosis present

## 2020-10-01 DIAGNOSIS — R739 Hyperglycemia, unspecified: Secondary | ICD-10-CM | POA: Diagnosis not present

## 2020-10-01 DIAGNOSIS — F17210 Nicotine dependence, cigarettes, uncomplicated: Secondary | ICD-10-CM | POA: Diagnosis present

## 2020-10-01 DIAGNOSIS — Q272 Other congenital malformations of renal artery: Secondary | ICD-10-CM | POA: Diagnosis not present

## 2020-10-01 DIAGNOSIS — E785 Hyperlipidemia, unspecified: Secondary | ICD-10-CM | POA: Diagnosis present

## 2020-10-01 DIAGNOSIS — Z4682 Encounter for fitting and adjustment of non-vascular catheter: Secondary | ICD-10-CM | POA: Diagnosis not present

## 2020-10-01 DIAGNOSIS — Z885 Allergy status to narcotic agent status: Secondary | ICD-10-CM | POA: Diagnosis not present

## 2020-10-01 DIAGNOSIS — J9811 Atelectasis: Secondary | ICD-10-CM | POA: Diagnosis not present

## 2020-10-01 DIAGNOSIS — Z7982 Long term (current) use of aspirin: Secondary | ICD-10-CM | POA: Diagnosis not present

## 2020-10-01 DIAGNOSIS — Z79899 Other long term (current) drug therapy: Secondary | ICD-10-CM | POA: Diagnosis not present

## 2020-10-01 DIAGNOSIS — R1110 Vomiting, unspecified: Secondary | ICD-10-CM | POA: Diagnosis not present

## 2020-10-01 DIAGNOSIS — J45909 Unspecified asthma, uncomplicated: Secondary | ICD-10-CM | POA: Diagnosis present

## 2020-10-01 DIAGNOSIS — G4733 Obstructive sleep apnea (adult) (pediatric): Secondary | ICD-10-CM | POA: Diagnosis not present

## 2020-10-01 DIAGNOSIS — Z823 Family history of stroke: Secondary | ICD-10-CM | POA: Diagnosis not present

## 2020-10-01 DIAGNOSIS — E875 Hyperkalemia: Secondary | ICD-10-CM | POA: Diagnosis not present

## 2020-10-01 DIAGNOSIS — R21 Rash and other nonspecific skin eruption: Secondary | ICD-10-CM | POA: Diagnosis not present

## 2020-10-01 DIAGNOSIS — Z951 Presence of aortocoronary bypass graft: Secondary | ICD-10-CM | POA: Diagnosis not present

## 2020-10-01 DIAGNOSIS — Z01812 Encounter for preprocedural laboratory examination: Secondary | ICD-10-CM | POA: Insufficient documentation

## 2020-10-01 DIAGNOSIS — I2510 Atherosclerotic heart disease of native coronary artery without angina pectoris: Secondary | ICD-10-CM | POA: Diagnosis present

## 2020-10-01 DIAGNOSIS — Z4689 Encounter for fitting and adjustment of other specified devices: Secondary | ICD-10-CM | POA: Diagnosis not present

## 2020-10-01 DIAGNOSIS — I739 Peripheral vascular disease, unspecified: Secondary | ICD-10-CM | POA: Diagnosis present

## 2020-10-01 DIAGNOSIS — I70223 Atherosclerosis of native arteries of extremities with rest pain, bilateral legs: Secondary | ICD-10-CM | POA: Diagnosis not present

## 2020-10-01 DIAGNOSIS — K567 Ileus, unspecified: Secondary | ICD-10-CM | POA: Diagnosis not present

## 2020-10-01 LAB — SARS CORONAVIRUS 2 (TAT 6-24 HRS): SARS Coronavirus 2: NEGATIVE

## 2020-10-01 NOTE — Telephone Encounter (Signed)
Per staff message from Serafina Mitchell, MD  Please call patient Momday and make sure he is not taking his Pletal  Spoke with patient who stated he stopped Pletal approximately one week ago.

## 2020-10-01 NOTE — Progress Notes (Signed)
Opened in error

## 2020-10-03 ENCOUNTER — Inpatient Hospital Stay (HOSPITAL_COMMUNITY)
Admission: RE | Admit: 2020-10-03 | Discharge: 2020-10-11 | DRG: 269 | Disposition: A | Discharge status: Home Health | Payer: PPO | Attending: Surgery | Admitting: Surgery

## 2020-10-03 ENCOUNTER — Inpatient Hospital Stay (HOSPITAL_COMMUNITY): Payer: PPO | Admitting: Vascular Surgery

## 2020-10-03 ENCOUNTER — Encounter (HOSPITAL_COMMUNITY)
Admission: RE | Disposition: A | Discharge status: Home Health | Payer: Self-pay | Source: Home / Self Care | Attending: Surgery

## 2020-10-03 ENCOUNTER — Inpatient Hospital Stay (HOSPITAL_COMMUNITY): Payer: PPO | Admitting: Certified Registered Nurse Anesthetist

## 2020-10-03 ENCOUNTER — Other Ambulatory Visit: Payer: Self-pay

## 2020-10-03 ENCOUNTER — Inpatient Hospital Stay (HOSPITAL_COMMUNITY): Payer: PPO

## 2020-10-03 ENCOUNTER — Encounter (HOSPITAL_COMMUNITY): Payer: Self-pay | Admitting: Surgery

## 2020-10-03 DIAGNOSIS — R1110 Vomiting, unspecified: Secondary | ICD-10-CM

## 2020-10-03 DIAGNOSIS — Z7982 Long term (current) use of aspirin: Secondary | ICD-10-CM

## 2020-10-03 DIAGNOSIS — Z951 Presence of aortocoronary bypass graft: Secondary | ICD-10-CM

## 2020-10-03 DIAGNOSIS — E875 Hyperkalemia: Secondary | ICD-10-CM | POA: Diagnosis not present

## 2020-10-03 DIAGNOSIS — R739 Hyperglycemia, unspecified: Secondary | ICD-10-CM | POA: Diagnosis not present

## 2020-10-03 DIAGNOSIS — J45909 Unspecified asthma, uncomplicated: Secondary | ICD-10-CM | POA: Diagnosis present

## 2020-10-03 DIAGNOSIS — I7419 Embolism and thrombosis of other parts of aorta: Secondary | ICD-10-CM | POA: Diagnosis present

## 2020-10-03 DIAGNOSIS — Z809 Family history of malignant neoplasm, unspecified: Secondary | ICD-10-CM | POA: Diagnosis not present

## 2020-10-03 DIAGNOSIS — I739 Peripheral vascular disease, unspecified: Secondary | ICD-10-CM | POA: Diagnosis present

## 2020-10-03 DIAGNOSIS — Z8249 Family history of ischemic heart disease and other diseases of the circulatory system: Secondary | ICD-10-CM | POA: Diagnosis not present

## 2020-10-03 DIAGNOSIS — K9189 Other postprocedural complications and disorders of digestive system: Secondary | ICD-10-CM | POA: Diagnosis not present

## 2020-10-03 DIAGNOSIS — I10 Essential (primary) hypertension: Secondary | ICD-10-CM | POA: Diagnosis present

## 2020-10-03 DIAGNOSIS — I2510 Atherosclerotic heart disease of native coronary artery without angina pectoris: Secondary | ICD-10-CM | POA: Diagnosis present

## 2020-10-03 DIAGNOSIS — I4891 Unspecified atrial fibrillation: Secondary | ICD-10-CM | POA: Diagnosis not present

## 2020-10-03 DIAGNOSIS — Z833 Family history of diabetes mellitus: Secondary | ICD-10-CM | POA: Diagnosis not present

## 2020-10-03 DIAGNOSIS — Z7951 Long term (current) use of inhaled steroids: Secondary | ICD-10-CM | POA: Diagnosis not present

## 2020-10-03 DIAGNOSIS — Z885 Allergy status to narcotic agent status: Secondary | ICD-10-CM | POA: Diagnosis not present

## 2020-10-03 DIAGNOSIS — Z823 Family history of stroke: Secondary | ICD-10-CM

## 2020-10-03 DIAGNOSIS — Q272 Other congenital malformations of renal artery: Secondary | ICD-10-CM | POA: Diagnosis not present

## 2020-10-03 DIAGNOSIS — R12 Heartburn: Secondary | ICD-10-CM | POA: Diagnosis not present

## 2020-10-03 DIAGNOSIS — I70223 Atherosclerosis of native arteries of extremities with rest pain, bilateral legs: Secondary | ICD-10-CM | POA: Diagnosis not present

## 2020-10-03 DIAGNOSIS — Q251 Coarctation of aorta: Secondary | ICD-10-CM

## 2020-10-03 DIAGNOSIS — I745 Embolism and thrombosis of iliac artery: Secondary | ICD-10-CM | POA: Diagnosis present

## 2020-10-03 DIAGNOSIS — Z20822 Contact with and (suspected) exposure to covid-19: Secondary | ICD-10-CM | POA: Diagnosis present

## 2020-10-03 DIAGNOSIS — Z79899 Other long term (current) drug therapy: Secondary | ICD-10-CM | POA: Diagnosis not present

## 2020-10-03 DIAGNOSIS — Z813 Family history of other psychoactive substance abuse and dependence: Secondary | ICD-10-CM

## 2020-10-03 DIAGNOSIS — K567 Ileus, unspecified: Secondary | ICD-10-CM | POA: Diagnosis not present

## 2020-10-03 DIAGNOSIS — I708 Atherosclerosis of other arteries: Secondary | ICD-10-CM | POA: Diagnosis present

## 2020-10-03 DIAGNOSIS — F1021 Alcohol dependence, in remission: Secondary | ICD-10-CM | POA: Diagnosis present

## 2020-10-03 DIAGNOSIS — F17210 Nicotine dependence, cigarettes, uncomplicated: Secondary | ICD-10-CM | POA: Diagnosis present

## 2020-10-03 DIAGNOSIS — D62 Acute posthemorrhagic anemia: Secondary | ICD-10-CM | POA: Diagnosis not present

## 2020-10-03 DIAGNOSIS — E785 Hyperlipidemia, unspecified: Secondary | ICD-10-CM | POA: Diagnosis present

## 2020-10-03 DIAGNOSIS — I779 Disorder of arteries and arterioles, unspecified: Secondary | ICD-10-CM | POA: Diagnosis present

## 2020-10-03 DIAGNOSIS — Z0189 Encounter for other specified special examinations: Secondary | ICD-10-CM

## 2020-10-03 HISTORY — PX: AORTA - BILATERAL FEMORAL ARTERY BYPASS GRAFT: SHX1175

## 2020-10-03 HISTORY — PX: ENDARTERECTOMY: SHX5162

## 2020-10-03 HISTORY — PX: ENDARTERECTOMY FEMORAL: SHX5804

## 2020-10-03 LAB — PROTIME-INR
INR: 1.3 — ABNORMAL HIGH (ref 0.8–1.2)
Prothrombin Time: 15.2 seconds (ref 11.4–15.2)

## 2020-10-03 LAB — POCT I-STAT 7, (LYTES, BLD GAS, ICA,H+H)
Acid-Base Excess: 2 mmol/L (ref 0.0–2.0)
Acid-base deficit: 2 mmol/L (ref 0.0–2.0)
Bicarbonate: 26.5 mmol/L (ref 20.0–28.0)
Bicarbonate: 23.8 mmol/L (ref 20.0–28.0)
Calcium, Ion: 1.18 mmol/L (ref 1.15–1.40)
Calcium, Ion: 1.08 mmol/L — ABNORMAL LOW (ref 1.15–1.40)
HCT: 38 % — ABNORMAL LOW (ref 39.0–52.0)
HCT: 34 % — ABNORMAL LOW (ref 39.0–52.0)
Hemoglobin: 12.9 g/dL — ABNORMAL LOW (ref 13.0–17.0)
Hemoglobin: 11.6 g/dL — ABNORMAL LOW (ref 13.0–17.0)
O2 Saturation: 100 %
O2 Saturation: 100 %
Patient temperature: 35
Patient temperature: 35.4
Potassium: 3.7 mmol/L (ref 3.5–5.1)
Potassium: 3.6 mmol/L (ref 3.5–5.1)
Sodium: 138 mmol/L (ref 135–145)
Sodium: 142 mmol/L (ref 135–145)
TCO2: 28 mmol/L (ref 22–32)
TCO2: 25 mmol/L (ref 22–32)
pCO2 arterial: 38.3 mmHg (ref 32.0–48.0)
pCO2 arterial: 39.6 mmHg (ref 32.0–48.0)
pH, Arterial: 7.439 (ref 7.350–7.450)
pH, Arterial: 7.378 (ref 7.350–7.450)
pO2, Arterial: 279 mmHg — ABNORMAL HIGH (ref 83.0–108.0)
pO2, Arterial: 183 mmHg — ABNORMAL HIGH (ref 83.0–108.0)

## 2020-10-03 LAB — CBC
HCT: 38.7 % — ABNORMAL LOW (ref 39.0–52.0)
HCT: 38.9 % — ABNORMAL LOW (ref 39.0–52.0)
Hemoglobin: 12.7 g/dL — ABNORMAL LOW (ref 13.0–17.0)
Hemoglobin: 13.1 g/dL (ref 13.0–17.0)
MCH: 30.4 pg (ref 26.0–34.0)
MCH: 31.2 pg (ref 26.0–34.0)
MCHC: 32.8 g/dL (ref 30.0–36.0)
MCHC: 33.7 g/dL (ref 30.0–36.0)
MCV: 92.6 fL (ref 80.0–100.0)
MCV: 92.6 fL (ref 80.0–100.0)
Platelets: UNDETERMINED 10*3/uL (ref 150–400)
RBC: 4.18 MIL/uL — ABNORMAL LOW (ref 4.22–5.81)
RBC: 4.2 MIL/uL — ABNORMAL LOW (ref 4.22–5.81)
RDW: 13.9 % (ref 11.5–15.5)
RDW: 14.1 % (ref 11.5–15.5)
WBC: 11.5 10*3/uL — ABNORMAL HIGH (ref 4.0–10.5)
WBC: 11.6 10*3/uL — ABNORMAL HIGH (ref 4.0–10.5)
nRBC: 0 % (ref 0.0–0.2)
nRBC: 0 % (ref 0.0–0.2)

## 2020-10-03 LAB — BASIC METABOLIC PANEL
Anion gap: 12 (ref 5–15)
BUN: 13 mg/dL (ref 8–23)
CO2: 21 mmol/L — ABNORMAL LOW (ref 22–32)
Calcium: 7.7 mg/dL — ABNORMAL LOW (ref 8.9–10.3)
Chloride: 102 mmol/L (ref 98–111)
Creatinine, Ser: 1.23 mg/dL (ref 0.61–1.24)
GFR, Estimated: 59 mL/min — ABNORMAL LOW (ref >60)
Glucose, Bld: 173 mg/dL — ABNORMAL HIGH (ref 70–99)
Potassium: 4.2 mmol/L (ref 3.5–5.1)
Sodium: 135 mmol/L (ref 135–145)

## 2020-10-03 LAB — APTT: aPTT: 35 seconds (ref 24–36)

## 2020-10-03 LAB — BLOOD GAS, ARTERIAL
Acid-base deficit: 5 mmol/L — ABNORMAL HIGH (ref 0.0–2.0)
Bicarbonate: 21.2 mmol/L (ref 20.0–28.0)
Drawn by: 44475
FIO2: 28
O2 Saturation: 95.1 %
Patient temperature: 37
pCO2 arterial: 51.1 mmHg — ABNORMAL HIGH (ref 32.0–48.0)
pH, Arterial: 7.241 — ABNORMAL LOW (ref 7.350–7.450)
pO2, Arterial: 92.2 mmHg (ref 83.0–108.0)

## 2020-10-03 LAB — CREATININE, SERUM
Creatinine, Ser: 1.32 mg/dL — ABNORMAL HIGH (ref 0.61–1.24)
GFR, Estimated: 54 mL/min — ABNORMAL LOW (ref >60)

## 2020-10-03 LAB — MAGNESIUM: Magnesium: 1.4 mg/dL — ABNORMAL LOW (ref 1.7–2.4)

## 2020-10-03 LAB — POCT ACTIVATED CLOTTING TIME
Activated Clotting Time: 191 seconds
Activated Clotting Time: 224 seconds

## 2020-10-03 SURGERY — CREATION, BYPASS, ARTERIAL, AORTA TO FEMORAL, BILATERAL, USING GRAFT
Anesthesia: General

## 2020-10-03 MED ORDER — ALBUMIN HUMAN 5 % IV SOLN: INTRAVENOUS | Status: DC | PRN

## 2020-10-03 MED ORDER — MAGNESIUM SULFATE 2 GM/50ML IV SOLN: 2.0000 g | Freq: Every day | INTRAVENOUS | Status: DC | PRN

## 2020-10-03 MED ORDER — ROCURONIUM BROMIDE 10 MG/ML (PF) SYRINGE
PREFILLED_SYRINGE | INTRAVENOUS | Status: DC | PRN
  Administered 2020-10-03: 10 mg via INTRAVENOUS
  Administered 2020-10-03: 20 mg via INTRAVENOUS
  Administered 2020-10-03: 80 mg via INTRAVENOUS
  Administered 2020-10-03: 40 mg via INTRAVENOUS
  Administered 2020-10-03: 30 mg via INTRAVENOUS

## 2020-10-03 MED ORDER — FENTANYL CITRATE (PF) 100 MCG/2ML IJ SOLN
INTRAMUSCULAR | Status: AC
  Administered 2020-10-03: 50 ug via INTRAVENOUS
  Filled 2020-10-03: qty 2

## 2020-10-03 MED ORDER — POTASSIUM CHLORIDE CRYS ER 20 MEQ PO TBCR: 20.0000 meq | EXTENDED_RELEASE_TABLET | Freq: Every day | ORAL | Status: DC | PRN

## 2020-10-03 MED ORDER — DEXAMETHASONE SODIUM PHOSPHATE 10 MG/ML IJ SOLN
INTRAMUSCULAR | Status: AC
  Filled 2020-10-03: qty 1

## 2020-10-03 MED ORDER — FENTANYL CITRATE (PF) 100 MCG/2ML IJ SOLN
25.0000 ug | INTRAMUSCULAR | Status: DC | PRN
  Administered 2020-10-03 (×2): 50 ug via INTRAVENOUS

## 2020-10-03 MED ORDER — FENTANYL CITRATE (PF) 250 MCG/5ML IJ SOLN
INTRAMUSCULAR | Status: AC
  Filled 2020-10-03: qty 10

## 2020-10-03 MED ORDER — ALUM & MAG HYDROXIDE-SIMETH 200-200-20 MG/5ML PO SUSP: 15.0000 mL | ORAL | Status: DC | PRN

## 2020-10-03 MED ORDER — PANTOPRAZOLE SODIUM 40 MG IV SOLR
40.0000 mg | Freq: Every day | INTRAVENOUS | Status: DC
  Administered 2020-10-03 – 2020-10-07 (×5): 40 mg via INTRAVENOUS
  Filled 2020-10-03 (×5): qty 40

## 2020-10-03 MED ORDER — PROPOFOL 10 MG/ML IV BOLUS
INTRAVENOUS | Status: DC | PRN
  Administered 2020-10-03: 50 mg via INTRAVENOUS
  Administered 2020-10-03: 80 mg via INTRAVENOUS
  Administered 2020-10-03: 10 mg via INTRAVENOUS

## 2020-10-03 MED ORDER — SODIUM CHLORIDE 0.9 % IV BOLUS
500.0000 mL | Freq: Once | INTRAVENOUS | Status: AC
  Administered 2020-10-03: 500 mL via INTRAVENOUS

## 2020-10-03 MED ORDER — HYDRALAZINE HCL 20 MG/ML IJ SOLN
5.0000 mg | INTRAMUSCULAR | Status: DC | PRN
  Administered 2020-10-03: 5 mg via INTRAVENOUS
  Filled 2020-10-03: qty 1

## 2020-10-03 MED ORDER — HEMOSTATIC AGENTS (NO CHARGE) OPTIME
TOPICAL | Status: DC | PRN
  Administered 2020-10-03: 1 via TOPICAL

## 2020-10-03 MED ORDER — PHENOL 1.4 % MT LIQD
1.0000 | OROMUCOSAL | Status: DC | PRN
  Filled 2020-10-03: qty 177

## 2020-10-03 MED ORDER — PHENYLEPHRINE 40 MCG/ML (10ML) SYRINGE FOR IV PUSH (FOR BLOOD PRESSURE SUPPORT)
PREFILLED_SYRINGE | INTRAVENOUS | Status: DC | PRN
  Administered 2020-10-03: 80 ug via INTRAVENOUS
  Administered 2020-10-03: 40 ug via INTRAVENOUS
  Administered 2020-10-03 (×2): 80 ug via INTRAVENOUS

## 2020-10-03 MED ORDER — MIDAZOLAM HCL 2 MG/2ML IJ SOLN
INTRAMUSCULAR | Status: AC
  Filled 2020-10-03: qty 2

## 2020-10-03 MED ORDER — ACETAMINOPHEN 10 MG/ML IV SOLN
1000.0000 mg | Freq: Once | INTRAVENOUS | Status: DC | PRN
  Administered 2020-10-03: 1000 mg via INTRAVENOUS

## 2020-10-03 MED ORDER — ACETAMINOPHEN 325 MG PO TABS
325.0000 mg | ORAL_TABLET | ORAL | Status: DC | PRN
  Administered 2020-10-05 – 2020-10-11 (×16): 650 mg via ORAL
  Filled 2020-10-03 (×17): qty 2

## 2020-10-03 MED ORDER — HYDROMORPHONE HCL 1 MG/ML IJ SOLN
INTRAMUSCULAR | Status: AC
  Filled 2020-10-03: qty 0.5

## 2020-10-03 MED ORDER — SODIUM CHLORIDE 0.9 % IV SOLN
INTRAVENOUS | Status: AC
  Filled 2020-10-03: qty 1.2

## 2020-10-03 MED ORDER — ORAL CARE MOUTH RINSE: 15.0000 mL | Freq: Once | OROMUCOSAL | Status: AC

## 2020-10-03 MED ORDER — CEFAZOLIN SODIUM-DEXTROSE 2-4 GM/100ML-% IV SOLN
2.0000 g | Freq: Three times a day (TID) | INTRAVENOUS | Status: AC
  Administered 2020-10-03 – 2020-10-04 (×2): 2 g via INTRAVENOUS
  Filled 2020-10-03 (×2): qty 100

## 2020-10-03 MED ORDER — PROTAMINE SULFATE 10 MG/ML IV SOLN
INTRAVENOUS | Status: DC | PRN
  Administered 2020-10-03: 40 mg via INTRAVENOUS
  Administered 2020-10-03: 10 mg via INTRAVENOUS

## 2020-10-03 MED ORDER — 0.9 % SODIUM CHLORIDE (POUR BTL) OPTIME
TOPICAL | Status: DC | PRN
  Administered 2020-10-03: 3000 mL

## 2020-10-03 MED ORDER — ACETAMINOPHEN 10 MG/ML IV SOLN
INTRAVENOUS | Status: AC
  Filled 2020-10-03: qty 100

## 2020-10-03 MED ORDER — PROPOFOL 10 MG/ML IV BOLUS
INTRAVENOUS | Status: AC
  Filled 2020-10-03: qty 20

## 2020-10-03 MED ORDER — LACTATED RINGERS IV SOLN: INTRAVENOUS | Status: DC

## 2020-10-03 MED ORDER — SODIUM CHLORIDE 0.9 % IV SOLN: INTRAVENOUS | Status: DC

## 2020-10-03 MED ORDER — ONDANSETRON HCL 4 MG/2ML IJ SOLN
INTRAMUSCULAR | Status: DC | PRN
  Administered 2020-10-03: 4 mg via INTRAVENOUS

## 2020-10-03 MED ORDER — DEXAMETHASONE SODIUM PHOSPHATE 10 MG/ML IJ SOLN
INTRAMUSCULAR | Status: DC | PRN
  Administered 2020-10-03: 10 mg via INTRAVENOUS

## 2020-10-03 MED ORDER — FENTANYL CITRATE (PF) 250 MCG/5ML IJ SOLN
INTRAMUSCULAR | Status: AC
Start: 2020-10-03 — End: ?
  Filled 2020-10-03: qty 5

## 2020-10-03 MED ORDER — FENTANYL CITRATE (PF) 100 MCG/2ML IJ SOLN
INTRAMUSCULAR | Status: AC
  Filled 2020-10-03: qty 2

## 2020-10-03 MED ORDER — CHLORHEXIDINE GLUCONATE 0.12 % MT SOLN
15.0000 mL | Freq: Once | OROMUCOSAL | Status: AC
  Administered 2020-10-03: 15 mL via OROMUCOSAL
  Filled 2020-10-03: qty 15

## 2020-10-03 MED ORDER — ONDANSETRON HCL 4 MG/2ML IJ SOLN
INTRAMUSCULAR | Status: AC
  Filled 2020-10-03: qty 2

## 2020-10-03 MED ORDER — PROTAMINE SULFATE 10 MG/ML IV SOLN
INTRAVENOUS | Status: AC
  Filled 2020-10-03: qty 5

## 2020-10-03 MED ORDER — MAGNESIUM HYDROXIDE 400 MG/5ML PO SUSP: 30.0000 mL | Freq: Every day | ORAL | Status: DC | PRN

## 2020-10-03 MED ORDER — MIDAZOLAM HCL 5 MG/5ML IJ SOLN
INTRAMUSCULAR | Status: DC | PRN
  Administered 2020-10-03 (×2): 1 mg via INTRAVENOUS

## 2020-10-03 MED ORDER — LIDOCAINE 2% (20 MG/ML) 5 ML SYRINGE
INTRAMUSCULAR | Status: DC | PRN
  Administered 2020-10-03: 60 mg via INTRAVENOUS

## 2020-10-03 MED ORDER — HEPARIN SODIUM (PORCINE) 1000 UNIT/ML IJ SOLN
INTRAMUSCULAR | Status: AC
  Filled 2020-10-03: qty 1

## 2020-10-03 MED ORDER — CEFAZOLIN SODIUM-DEXTROSE 2-4 GM/100ML-% IV SOLN
2.0000 g | INTRAVENOUS | Status: AC
  Administered 2020-10-03 (×2): 2 g via INTRAVENOUS
  Filled 2020-10-03: qty 100

## 2020-10-03 MED ORDER — CHLORHEXIDINE GLUCONATE CLOTH 2 % EX PADS
6.0000 | MEDICATED_PAD | Freq: Every day | CUTANEOUS | Status: DC
  Administered 2020-10-03 – 2020-10-11 (×3): 6 via TOPICAL

## 2020-10-03 MED ORDER — SODIUM CHLORIDE 0.9 % IV SOLN
500.0000 mL | Freq: Once | INTRAVENOUS | Status: AC | PRN
  Administered 2020-10-04: 500 mL via INTRAVENOUS

## 2020-10-03 MED ORDER — SODIUM CHLORIDE 0.9 % IV SOLN
INTRAVENOUS | Status: DC | PRN
  Administered 2020-10-03: 500 mL

## 2020-10-03 MED ORDER — SODIUM CHLORIDE (PF) 0.9 % IJ SOLN
INTRAMUSCULAR | Status: AC
  Filled 2020-10-03: qty 10

## 2020-10-03 MED ORDER — ONDANSETRON HCL 4 MG/2ML IJ SOLN
4.0000 mg | Freq: Four times a day (QID) | INTRAMUSCULAR | Status: DC | PRN
  Administered 2020-10-08: 4 mg via INTRAVENOUS
  Filled 2020-10-03: qty 2

## 2020-10-03 MED ORDER — HYDROMORPHONE HCL 1 MG/ML IJ SOLN
0.5000 mg | INTRAMUSCULAR | Status: DC | PRN
  Administered 2020-10-03: 0.5 mg via INTRAVENOUS
  Administered 2020-10-04 (×2): 1 mg via INTRAVENOUS
  Filled 2020-10-03 (×3): qty 1

## 2020-10-03 MED ORDER — LACTATED RINGERS IV SOLN: INTRAVENOUS | Status: DC | PRN

## 2020-10-03 MED ORDER — CHLORHEXIDINE GLUCONATE CLOTH 2 % EX PADS: 6.0000 | MEDICATED_PAD | Freq: Once | CUTANEOUS | Status: DC

## 2020-10-03 MED ORDER — PHENYLEPHRINE HCL-NACL 10-0.9 MG/250ML-% IV SOLN
INTRAVENOUS | Status: DC | PRN
  Administered 2020-10-03 (×2): 20 ug/min via INTRAVENOUS

## 2020-10-03 MED ORDER — ACETAMINOPHEN 325 MG RE SUPP
325.0000 mg | RECTAL | Status: DC | PRN
  Filled 2020-10-03: qty 2

## 2020-10-03 MED ORDER — EPHEDRINE SULFATE 50 MG/ML IJ SOLN
INTRAMUSCULAR | Status: DC | PRN
  Administered 2020-10-03 (×2): 5 mg via INTRAVENOUS

## 2020-10-03 MED ORDER — METOPROLOL TARTRATE 5 MG/5ML IV SOLN: 2.0000 mg | INTRAVENOUS | Status: DC | PRN

## 2020-10-03 MED ORDER — PHENYLEPHRINE 40 MCG/ML (10ML) SYRINGE FOR IV PUSH (FOR BLOOD PRESSURE SUPPORT)
PREFILLED_SYRINGE | INTRAVENOUS | Status: AC
  Filled 2020-10-03: qty 10

## 2020-10-03 MED ORDER — FENTANYL CITRATE (PF) 100 MCG/2ML IJ SOLN
INTRAMUSCULAR | Status: DC | PRN
  Administered 2020-10-03: 200 ug via INTRAVENOUS
  Administered 2020-10-03 (×3): 50 ug via INTRAVENOUS
  Administered 2020-10-03: 100 ug via INTRAVENOUS
  Administered 2020-10-03 (×2): 50 ug via INTRAVENOUS
  Administered 2020-10-03: 100 ug via INTRAVENOUS
  Administered 2020-10-03: 50 ug via INTRAVENOUS

## 2020-10-03 MED ORDER — LABETALOL HCL 5 MG/ML IV SOLN: 10.0000 mg | INTRAVENOUS | Status: DC | PRN

## 2020-10-03 MED ORDER — GUAIFENESIN-DM 100-10 MG/5ML PO SYRP
15.0000 mL | ORAL_SOLUTION | ORAL | Status: DC | PRN
  Administered 2020-10-05: 15 mL via ORAL
  Filled 2020-10-03 (×2): qty 15

## 2020-10-03 MED ORDER — ONDANSETRON HCL 4 MG/2ML IJ SOLN: 4.0000 mg | Freq: Once | INTRAMUSCULAR | Status: DC | PRN

## 2020-10-03 MED ORDER — EPHEDRINE 5 MG/ML INJ
INTRAVENOUS | Status: AC
  Filled 2020-10-03: qty 10

## 2020-10-03 MED ORDER — LIDOCAINE 2% (20 MG/ML) 5 ML SYRINGE
INTRAMUSCULAR | Status: AC
  Filled 2020-10-03: qty 5

## 2020-10-03 MED ORDER — HEPARIN SODIUM (PORCINE) 1000 UNIT/ML IJ SOLN
INTRAMUSCULAR | Status: DC | PRN
  Administered 2020-10-03: 6000 [IU] via INTRAVENOUS
  Administered 2020-10-03: 1000 [IU] via INTRAVENOUS
  Administered 2020-10-03: 3000 [IU] via INTRAVENOUS

## 2020-10-03 MED ORDER — SUGAMMADEX SODIUM 200 MG/2ML IV SOLN
INTRAVENOUS | Status: DC | PRN
  Administered 2020-10-03: 200 mg via INTRAVENOUS

## 2020-10-03 MED ORDER — HEPARIN SODIUM (PORCINE) 5000 UNIT/ML IJ SOLN
5000.0000 [IU] | Freq: Three times a day (TID) | INTRAMUSCULAR | Status: DC
  Administered 2020-10-04 – 2020-10-10 (×19): 5000 [IU] via SUBCUTANEOUS
  Filled 2020-10-03 (×17): qty 1

## 2020-10-03 SURGICAL SUPPLY — 77 items
ADH SKN CLS APL DERMABOND .7 (GAUZE/BANDAGES/DRESSINGS) ×8
AGENT HMST KT MTR STRL THRMB (HEMOSTASIS) ×2
BAG ISL DRAPE 18X18 STRL (DRAPES) ×4
BAG ISOLATION DRAPE 18X18 (DRAPES) ×4 IMPLANT
CANISTER SUCT 3000ML PPV (MISCELLANEOUS) ×3 IMPLANT
CLIP VESOCCLUDE MED 24/CT (CLIP) ×3 IMPLANT
CLIP VESOCCLUDE SM WIDE 24/CT (CLIP) ×3 IMPLANT
COVER MAYO STAND STRL (DRAPES) ×3 IMPLANT
DERMABOND ADVANCED (GAUZE/BANDAGES/DRESSINGS) ×4
DERMABOND ADVANCED .7 DNX12 (GAUZE/BANDAGES/DRESSINGS) ×8 IMPLANT
DRAPE ISOLATION BAG 18X18 (DRAPES) ×2
ELECT BLADE 4.0 EZ CLEAN MEGAD (MISCELLANEOUS) ×6
ELECT BLADE 6.5 EXT (BLADE) ×3 IMPLANT
ELECT CAUTERY BLADE 6.4 (BLADE) IMPLANT
ELECT REM PT RETURN 9FT ADLT (ELECTROSURGICAL) ×9
ELECTRODE BLDE 4.0 EZ CLN MEGD (MISCELLANEOUS) ×4 IMPLANT
ELECTRODE REM PT RTRN 9FT ADLT (ELECTROSURGICAL) ×6 IMPLANT
FELT TEFLON 1X6 (MISCELLANEOUS) ×3 IMPLANT
GEL ULTRASOUND 20GR AQUASONIC (MISCELLANEOUS) ×3 IMPLANT
GLOVE BIO SURGEON STRL SZ 6.5 (GLOVE) ×9 IMPLANT
GLOVE BIOGEL PI IND STRL 6.5 (GLOVE) ×10 IMPLANT
GLOVE BIOGEL PI IND STRL 7.0 (GLOVE) ×2 IMPLANT
GLOVE BIOGEL PI IND STRL 7.5 (GLOVE) ×4 IMPLANT
GLOVE BIOGEL PI IND STRL 9 (GLOVE) ×2 IMPLANT
GLOVE BIOGEL PI INDICATOR 6.5 (GLOVE) ×5
GLOVE BIOGEL PI INDICATOR 7.0 (GLOVE) ×1
GLOVE BIOGEL PI INDICATOR 7.5 (GLOVE) ×2
GLOVE BIOGEL PI INDICATOR 9 (GLOVE) ×1
GLOVE SURG SS PI 6.0 STRL IVOR (GLOVE) ×3 IMPLANT
GLOVE SURG SS PI 7.5 STRL IVOR (GLOVE) ×6 IMPLANT
GOWN STRL REUS W/ TWL LRG LVL3 (GOWN DISPOSABLE) ×10 IMPLANT
GOWN STRL REUS W/ TWL XL LVL3 (GOWN DISPOSABLE) ×6 IMPLANT
GOWN STRL REUS W/TWL LRG LVL3 (GOWN DISPOSABLE) ×15
GOWN STRL REUS W/TWL XL LVL3 (GOWN DISPOSABLE) ×9
GRAFT HEMASHIELD 14X7MM (Vascular Products) ×3 IMPLANT
GRAFT VASC 18MMX15CM (Graft) ×3 IMPLANT
HEMOSTAT SNOW SURGICEL 2X4 (HEMOSTASIS) IMPLANT
INSERT FOGARTY 61MM (MISCELLANEOUS) ×6 IMPLANT
INSERT FOGARTY SM (MISCELLANEOUS) ×9 IMPLANT
KIT BASIN OR (CUSTOM PROCEDURE TRAY) ×3 IMPLANT
KIT TURNOVER KIT B (KITS) ×3 IMPLANT
LOOP VESSEL MINI RED (MISCELLANEOUS) ×3 IMPLANT
NS IRRIG 1000ML POUR BTL (IV SOLUTION) ×6 IMPLANT
PACK AORTA (CUSTOM PROCEDURE TRAY) ×3 IMPLANT
PAD ARMBOARD 7.5X6 YLW CONV (MISCELLANEOUS) ×6 IMPLANT
PENCIL BUTTON HOLSTER BLD 10FT (ELECTRODE) ×3 IMPLANT
PUNCH AORTIC ROTATE 5MM 8IN (MISCELLANEOUS) ×3 IMPLANT
SPONGE LAP 18X18 RF (DISPOSABLE) ×3 IMPLANT
SPONGE LAP 18X18 X RAY DECT (DISPOSABLE) ×3 IMPLANT
SURGIFLO W/THROMBIN 8M KIT (HEMOSTASIS) ×3 IMPLANT
SUT ETHIBOND 5 LR DA (SUTURE) ×3 IMPLANT
SUT PDS AB 1 TP1 54 (SUTURE) ×6 IMPLANT
SUT PROLENE 3 0 SH 48 (SUTURE) ×6 IMPLANT
SUT PROLENE 3 0 SH DA (SUTURE) ×12 IMPLANT
SUT PROLENE 5 0 C 1 24 (SUTURE) ×18 IMPLANT
SUT PROLENE 5 0 C 1 36 (SUTURE) ×6 IMPLANT
SUT PROLENE 6 0 BV (SUTURE) ×27 IMPLANT
SUT SILK 2 0 (SUTURE) ×6
SUT SILK 2 0 TIES 17X18 (SUTURE) ×3
SUT SILK 2 0SH CR/8 30 (SUTURE) ×3 IMPLANT
SUT SILK 2-0 18XBRD TIE 12 (SUTURE) ×4 IMPLANT
SUT SILK 2-0 18XBRD TIE BLK (SUTURE) ×2 IMPLANT
SUT SILK 3 0 (SUTURE) ×6
SUT SILK 3 0 TIES 17X18 (SUTURE) ×3
SUT SILK 3-0 18XBRD TIE 12 (SUTURE) ×4 IMPLANT
SUT SILK 3-0 18XBRD TIE BLK (SUTURE) ×2 IMPLANT
SUT VIC AB 2-0 CT1 27 (SUTURE) ×15
SUT VIC AB 2-0 CT1 TAPERPNT 27 (SUTURE) ×10 IMPLANT
SUT VIC AB 3-0 SH 27 (SUTURE) ×12
SUT VIC AB 3-0 SH 27X BRD (SUTURE) ×8 IMPLANT
SUT VIC AB 4-0 PS2 18 (SUTURE) ×6 IMPLANT
SUT VICRYL 4-0 PS2 18IN ABS (SUTURE) ×6 IMPLANT
TOWEL GREEN STERILE (TOWEL DISPOSABLE) ×6 IMPLANT
TOWEL GREEN STERILE FF (TOWEL DISPOSABLE) ×3 IMPLANT
TOWEL SURG RFD BLUE STRL DISP (DISPOSABLE) ×3 IMPLANT
TRAY FOLEY MTR SLVR 16FR STAT (SET/KITS/TRAYS/PACK) ×3 IMPLANT
WATER STERILE IRR 1000ML POUR (IV SOLUTION) ×6 IMPLANT

## 2020-10-03 NOTE — Op Note (Signed)
Patient name: Gregory [name] MRN: [id number] DOB: [birth year] Sex: male  10/03/2020 Pre-operative Diagnosis: Aortoiliac occlusive disease with severe claudication Post-operative diagnosis:  Same Surgeon:  [name] Assistants:  Ivin Booty Procedure:   #1: Aortobifemoral bypass graft (14 x 7 dacryon)   #2: Reimplantation of the inferior mesenteric artery (inferior mesenteric artery bypass)   #3: Aortic endarterectomy   #4: Bilateral common femoral, superficial femoral, and profundofemoral endarterectomy Anesthesia: General Blood Loss: 1 L Specimens: None  Findings: Extensive circumferential aortic plaque which necessitated aortic endarterectomy up to the renal arteries.  The proximal anastomosis was end to end.  I then placed a 18 mm dacryon cuff over top of the anastomosis.  There is approximately a 4 cm cuff of native aorta which was endarterectomized before the aortic anastomosis.  Distally on the right, there was extensive bulky plaque in the distal common femoral artery and proximal superficial femoral and profundofemoral artery necessitating endarterectomy.  The anastomosis goes out onto the profundofemoral artery for 3 mm.  On the left, the common femoral artery was occluded.  I endarterectomized approximately 2 cm of the distal common femoral artery.  There was extensive plaque going out onto the superficial femoral artery for 3 cm which was endarterectomized.  Similarly the ostium of the profundofemoral main trunk had bulky calcific plaque which required endarterectomy.  The left distal anastomosis begins in the distal common femoral artery and goes down onto the superficial femoral artery for approximately 2.5 cm.  The inferior mesenteric artery was reimplanted on the left lateral side of the tube portion of the aortic graft  Indications: This is a 69 year old gentleman who has had progressive thigh and buttock claudication which has progressed to the point where he can only  ambulate a minimal distance.  He had a CT angiogram that showed severe aortoiliac occlusive disease.  He comes in today for revascularization.  The risks and benefits of the procedure were discussed with the patient and his wife at the preoperative visit.  Procedure:  The patient was identified in the holding area and taken to [place] 12  The patient was then placed supine on the table. general anesthesia was administered.  The patient was prepped and draped in the usual sterile fashion.  A time out was called and antibiotics were administered.  A PA was necessary to expedite the procedure and to assist with the technical details of the procedure.  Longitudinal incisions were made in both groins.  Cautery was used divide subcutaneous tissue down to the femoral sheath which was opened sharply.  I exposed the common femoral artery from the inguinal ligament down to the bifurcation.  On each side, the profundofemoral artery was dissected out to its primary branches.  On the right, the common femoral artery was heavily calcified down to the origins of the superficial femoral and profundofemoral artery.  On the left this appeared similar however the plaque in the superficial femoral artery appeared to end approximately 3 cm down onto the superficial femoral artery.  Next, the crossing circumflex iliac veins were ligated underneath inguinal ligament.  The groins were then packed with a moist Ray-Tec.  Midline incision was then made from the xiphoid down to below the umbilicus.  Cautery was used about subcutaneous tissue down to the fascia which was opened with cautery.  Peritoneal cavity was then entered sharply and opened throughout the length of the incision.  The abdomen was inspected.  There was no gross pathology.  A Balfour  was placed.  The Omni-Tract retractor was then set up.  The transverse colon was reflected cephalad and the small bowel was mobilized to the patient's right.  The ligament of Treitz was  taken down with cautery and the retroperitoneum was then opened.  I first identified the inferior mesenteric artery which was a 4 to 5 mm artery.  This was encircled with vessel loop.  I then dissected out the aortic bifurcation.  This was heavily calcified circumferentially.  Identified the right and left common iliac arteries.  I then created a tunnel between the abdomen and the groin on each side, making sure to tunnel posterior to the ureter.  Next the proximal aorta was then dissected out.  Again this was heavily calcified all the way up to the left renal vein.  An accessory renal artery was ligated bilaterally as it was originating off of the calcified aorta.  There did appear to be a soft area of the aorta at the level of the renal arteries which was suitable for clamping.  At this point the patient was fully heparinized.  After the heparin circulated, the aorta was occluded with a Harken clamp at the level of the renal arteries.  I placed a Zanger clamp distally however this would not compress the aorta.  I then used a #11 blade to make an aortotomy proximal to the inferior mesenteric artery.  Curved Mayo scissors were then used to transect the aorta.  Lumbar arteries were ligated with a silk suture.  I then performed endarterectomy of the distal aorta so that I had adequate tissue for oversewing.  The distal aorta was oversewn with 2 layers of 3-0 Prolene.  I then performed an extensive aortic endarterectomy up to the renal arteries.  I was able to remove all of the plaque up to the clamp.  There was approximately 3 cm of native aorta below the clamp which was suitable for anastomosis.  A 14 x 7 bifurcated graft was brought onto the field and cut to the appropriate length.  I then performed a running anastomosis with 3-0 Prolene incorporating a felt strip.  The proximal clamp was then removed.  1 pledgeted suture was required for hemostasis.  I then utilized a 14 mm dacryon tube graft and placed this over  top of the aortic anastomosis.  The cuff goes up to the renal arteries.  Next each limb of the graft was brought through the respective tunnel into the groin.  I performed the right femoral anastomosis first.  The profunda branches were occluded with baby Gregory clamps and the superficial femoral artery was occluded with Vesseloops.  A #11 blade was used to make an arteriotomy in the common femoral artery.  I extended this up onto the common femoral artery for approximately 1.5 cm and then down onto the profundofemoral artery for half a centimeter.  Endarterectomy was performed in the common femoral artery.  I reached out into the proximal common femoral artery to remove additional plaque.  The endarterectomy extended into the main trunk of the profundofemoral artery and the proximal portion of the superficial femoral artery.  The distal plaque was tacked down with 6-0 Prolene suture.  The graft was then cut to the appropriate length and beveled to fit the size the arteriotomy.  A running anastomosis was created with 5-0 Prolene.  Prior to completion the appropriate flushing maneuvers were performed and the anastomosis was completed.  Sequential declamping was performed and blood flow was reestablished to the left  leg.  The groin was then packed with a moist Ray-Tec.  Attention was then turned towards the left groin.  The 2 main profunda branches were occluded with baby Gregory clamps and a fistula clamp was used to occlude the superficial femoral artery approximately 4 cm from its origin.  I then used a #11 blade to make an arteriotomy in the common femoral artery.  This was completely occluded.  I extended the arteriotomy with Potts scissors down onto the superficial femoral artery for about 3 cm until I was beyond the plaque.  I then performed endarterectomy of the superficial femoral artery and remove some of the occlusive plaque in the common femoral artery so that I could place sutures.  I then performed  endarterectomy of the main trunk of the profundofemoral artery.  There was good backbleeding from the superficial femoral and profundofemoral arteries.  The dacryon graft on the left limb was then cut to the appropriate length and beveled to fit the size the arteriotomy.  A running anastomosis was created with 5-0 Prolene.  The anastomosis went down onto the superficial femoral artery for approximately 3 cm.  Prior to completion the appropriate flushing maneuvers were performed and sequential declamping was performed.  2 pledgeted sutures were required on the lateral side of the patch of the superficial femoral artery where it was very thin.  Blood flow was then reestablished to the left leg.  The wound was packed with a moist Ray-Tec.  Attention was then turned back towards the abdomen.  I ligated the inferior mesenteric artery at its origin.  There was moderate backbleeding from the inferior mesenteric artery.  I elected to reimplant this.  The aortic graft was then occluded proximally and distally.  A graftotomy was made with a #11 blade on the left lateral side of the tube portion of the graft.  A #5 punch was used to open the graft.  I performed a end-to-side anastomosis between the graft and the inferior mesenteric artery using a running 6-0 Prolene.  Prior to completion the appropriate flushing maneuvers were performed and the clamps were then released after the anastomosis was completed.  Hand-held Doppler was used to evaluate the signal of the inferior mesenteric artery which was triphasic.  There were also triphasic signals in bilateral profunda and superficial femoral arteries.    Next, the patient's heparin was reversed with 50 mg of protamine.  Both groins were then irrigated.  Once hemostasis was satisfactory, the groins were closed by reapproximating the femoral sheath with 2-0 Vicryl.  The subcutaneous tissue was then closed with additional layers with 3-0 Vicryl followed by subcuticular closure.   Attention was then turned back towards the abdomen.  This was irrigated.  Once hemostasis was satisfactory, the retroperitoneum was closed over the graft with a running 2-0 Vicryl.  The small bowel was placed back into its anatomic position.  I ran the small bowel.  There was no obvious defects.  The retractors were then removed.  The omentum and transverse colon were placed back to their position.  The NG tube was confirmed to be in the stomach.  The fascia was then closed with running #1 PDS suture x2.  The subcutaneous tissue was closed with 2-0 Vicryl and the skin was closed with 3-0 Vicryl.  Dermabond was applied to the incisions.  The patient had brisk posterior tibial Doppler signals bilaterally.  The patient was then successfully extubated and taken to recovery in stable condition.  There were no immediate  complications.   Disposition: To PACU stable   V. [name], M.D., [facility] [place] Office: [phone] Pager:  [phone]

## 2020-10-03 NOTE — Anesthesia Procedure Notes (Signed)
Central Venous Catheter Insertion Performed by: Murvin Natal, MD, anesthesiologist Start/End10/13/2021 8:00 AM, 10/03/2020 8:15 AM Patient location: Pre-op. Preanesthetic checklist: patient identified, IV checked, site marked, risks and benefits discussed, surgical consent, monitors and equipment checked, pre-op evaluation, timeout performed and anesthesia consent Position: Trendelenburg Lidocaine 1% used for infiltration and patient sedated Hand hygiene performed , maximum sterile barriers used  and Seldinger technique used Catheter size: 8.5 Fr Total catheter length 10. Sheath introducer Procedure performed using ultrasound guided technique. Ultrasound Notes:anatomy identified, needle tip was noted to be adjacent to the nerve/plexus identified, no ultrasound evidence of intravascular and/or intraneural injection and image(s) printed for medical record Attempts: 1 Following insertion, line sutured, dressing applied and Biopatch. Post procedure assessment: blood return through all ports, free fluid flow and no air  Patient tolerated the procedure well with no immediate complications.

## 2020-10-03 NOTE — Discharge Instructions (Signed)
 [facility]  Discharge Instructions   Open Aortic Surgery  Please refer to the following instructions for your post-procedure care. Your surgeon or Physician Assistant will discuss any changes with you.  Activity  Avoid lifting more than eight pounds (a gallon of milk) until after your first post-operative visit. You are encouraged to walk as much as you can. You can slowly return to normal activities but must avoid strenuous activity and heavy lifting until your doctor tells you it's okay. Heavy lifting can hurt the incision and cause a hernia. Avoid activities such as vacuuming or swinging a golf club. It is normal to feel tired for several weeks after your surgery. Do not drive until your doctor gives the okay and you are no longer taking prescription pain medications. It is also normal to have difficulty with sleep habits, eating and bowl movements after surgery. These will go away with time.  Bathing/Showering  Shower daily after you go home. Do not soak in a bathtub, hot tub, or swim until the incision heals.  Incision Care  Shower every day. Clean your incision with mild soap and water. Pat the area dry with a clean towel. You do not need a bandage unless otherwise instructed. Do not apply any ointments or creams to your incision. You may have skin glue on your incision. Do not peel it off. It will come off on its own in about one week. If you have staples or sutures along your incision, they will be removed at your post op appointment.  If you have groin incisions, wash the groin wounds with soap and water daily and pat dry. (No tub bath-only shower)  Then put a dry gauze or washcloth in the groin to keep this area dry to help prevent wound infection.  Do this daily and as needed.  Do not use Vaseline or neosporin on your incisions.  Only use soap and water on your incisions and then protect and keep dry.  Diet  Resume your normal diet. There are no  special food restriction following this procedure. A low fat/low cholesterol diet is recommended for all patients with vascular disease. After your aortic surgery, it's normal to feel full faster than usual and to not feel as hungry as you normally would. You will probably lose weight initially following your surgery. It's best to eat small, frequent meals over the course of the day. Call the office if you find that you are unable to eat even small meals.   In order to heal from your surgery, it is CRITICAL to get adequate nutrition. Your body requires vitamins, minerals, and protein. Vegetables are the best source of vitamins and minerals. If you have pain, you may take over-the-counter pain reliever such as acetaminophen (Tylenol). If you were prescribed a stronger pain medication, please be aware these medication can cause nausea and constipation. Prevent nausea by taking the medication with a snack or meal. Avoid constipation by drinking plenty of fluids and eating foods with a high amount of fiber, such as fruits, vegetables and grains. Take 100mg of the over-the-counter stool softener Colace twice a day as needed to help with constipation. A laxative, such as Milk of Magnesia, may be recommended for you at this time. Do not take a laxative unless your surgeon or P.A. tells you it's OK.  Do not take Tylenol if you are taking stronger pain medications (such as Percocet).  Follow Up  Our office will schedule a follow up   appointment 2-3 weeks after discharge.  Please call us immediately for any of the following conditions    .     Severe or worsening pain in your legs or feet or in your abdomen back or chest. Increased pain, redness drainage (pus) from your incision site. Increased abdominal pain, bloating, nausea, vomiting, or persistent diarrhea. Fever of 101 degrees or higher. Swelling in your leg (s).  Reduce your risk of vascular disease  Stop smoking. If you would like help, call  [facility] at 1-800-QUIT-NOW (1-800-784-8669) or [place] at 336-586-4000. Manage your cholesterol Maintain a desired weight Control your diabetes Keep your blood pressure down  If you have any questions please call the office at [phone].   

## 2020-10-03 NOTE — Anesthesia Procedure Notes (Signed)
Procedure Name: Intubation Date/Time: 10/03/2020 8:48 AM Performed by: Verdie Drown, CRNA Pre-anesthesia Checklist: Patient identified, Emergency Drugs available, Suction available and Patient being monitored Patient Re-evaluated:Patient Re-evaluated prior to induction Oxygen Delivery Method: Circle system utilized Preoxygenation: Pre-oxygenation with 100% oxygen Induction Type: IV induction Ventilation: Mask ventilation without difficulty Laryngoscope Size: Mac and Miller Grade View: Grade II Tube type: Oral Tube size: 7.5 mm Number of attempts: 1 Airway Equipment and Method: Stylet and Oral airway Placement Confirmation: ETT inserted through vocal cords under direct vision,  positive ETCO2 and breath sounds checked- equal and bilateral Secured at: 22 cm Tube secured with: Tape Dental Injury: Teeth and Oropharynx as per pre-operative assessment

## 2020-10-03 NOTE — Transfer of Care (Signed)
Immediate Anesthesia Transfer of Care Note  Patient: Gregory [name]  Procedure(s) Performed: AORTA BIFEMORAL BYPASS GRAFT USING A HEMASHIELD GOLD BIFURCATED 14 X 25mm GRAFT AND INFERIOR MESENTERIC ARTERY REIMPLANTATION (N/A ) BILATERAL FEMORAL ENDARTERECTOMY (Bilateral ) AORTIC ENDARTERECTOMY (N/A )  Patient Location: PACU  Anesthesia Type:General  Level of Consciousness: patient cooperative and responds to stimulation  Airway & Oxygen Therapy: Patient Spontanous Breathing and Patient connected to face mask oxygen  Post-op Assessment: Report given to RN, Post -op Vital signs reviewed and stable and Patient moving all extremities X 4  Post vital signs: Reviewed and stable  Last Vitals:  Vitals Value Taken Time  BP 118/76 10/03/20 1501  Temp    Pulse 57 10/03/20 1511  Resp 18 10/03/20 1511  SpO2 100 % 10/03/20 1511  Vitals shown include unvalidated device data.  Last Pain:  Vitals:   10/03/20 0700  TempSrc: Oral  PainSc: 0-No pain         Complications: No complications documented.

## 2020-10-03 NOTE — Anesthesia Procedure Notes (Signed)
Arterial Line Insertion Start/End10/13/2021 8:10 AM, 10/03/2020 8:15 AM Performed by: Verdie Drown, CRNA, CRNA  Patient location: Pre-op. Preanesthetic checklist: patient identified, IV checked, site marked, risks and benefits discussed, surgical consent, monitors and equipment checked, pre-op evaluation, timeout performed and anesthesia consent Lidocaine 1% used for infiltration Left, radial was placed Catheter size: 20 Fr Hand hygiene performed  and maximum sterile barriers used  Allen's test indicative of satisfactory collateral circulation Attempts: 1 Procedure performed without using ultrasound guided technique. Following insertion, dressing applied and Biopatch. Post procedure assessment: normal and unchanged

## 2020-10-03 NOTE — Interval H&P Note (Signed)
History and Physical Interval Note:  10/03/2020 8:22 AM  Gregory [name]  has presented today for surgery, with the diagnosis of PAD.  The various methods of treatment have been discussed with the patient and family. After consideration of risks, benefits and other options for treatment, the patient has consented to  Procedure(s): AORTA BIFEMORAL BYPASS GRAFT (N/A) as a surgical intervention.  The patient's history has been reviewed, patient examined, no change in status, stable for surgery.  I have reviewed the patient's chart and labs.  Questions were answered to the patient's satisfaction.     [name]

## 2020-10-04 ENCOUNTER — Inpatient Hospital Stay (HOSPITAL_COMMUNITY): Payer: PPO

## 2020-10-04 ENCOUNTER — Encounter (HOSPITAL_COMMUNITY): Payer: Self-pay | Admitting: Surgery

## 2020-10-04 LAB — COMPREHENSIVE METABOLIC PANEL
ALT: 19 U/L (ref 0–44)
AST: 38 U/L (ref 15–41)
Albumin: 2.9 g/dL — ABNORMAL LOW (ref 3.5–5.0)
Alkaline Phosphatase: 25 U/L — ABNORMAL LOW (ref 38–126)
Anion gap: 11 (ref 5–15)
BUN: 21 mg/dL (ref 8–23)
CO2: 21 mmol/L — ABNORMAL LOW (ref 22–32)
Calcium: 6.9 mg/dL — ABNORMAL LOW (ref 8.9–10.3)
Chloride: 101 mmol/L (ref 98–111)
Creatinine, Ser: 1.88 mg/dL — ABNORMAL HIGH (ref 0.61–1.24)
GFR, Estimated: 35 mL/min — ABNORMAL LOW (ref >60)
Glucose, Bld: 270 mg/dL — ABNORMAL HIGH (ref 70–99)
Potassium: 5.1 mmol/L (ref 3.5–5.1)
Sodium: 133 mmol/L — ABNORMAL LOW (ref 135–145)
Total Bilirubin: 0.5 mg/dL (ref 0.3–1.2)
Total Protein: 4.8 g/dL — ABNORMAL LOW (ref 6.5–8.1)

## 2020-10-04 LAB — CBC
HCT: 33.9 % — ABNORMAL LOW (ref 39.0–52.0)
Hemoglobin: 10.9 g/dL — ABNORMAL LOW (ref 13.0–17.0)
MCH: 30.4 pg (ref 26.0–34.0)
MCHC: 32.2 g/dL (ref 30.0–36.0)
MCV: 94.4 fL (ref 80.0–100.0)
Platelets: 90 10*3/uL — ABNORMAL LOW (ref 150–400)
RBC: 3.59 MIL/uL — ABNORMAL LOW (ref 4.22–5.81)
RDW: 14.4 % (ref 11.5–15.5)
WBC: 10.8 10*3/uL — ABNORMAL HIGH (ref 4.0–10.5)
nRBC: 0 % (ref 0.0–0.2)

## 2020-10-04 LAB — GLUCOSE, CAPILLARY
Glucose-Capillary: 110 mg/dL — ABNORMAL HIGH (ref 70–99)
Glucose-Capillary: 125 mg/dL — ABNORMAL HIGH (ref 70–99)
Glucose-Capillary: 101 mg/dL — ABNORMAL HIGH (ref 70–99)
Glucose-Capillary: 126 mg/dL — ABNORMAL HIGH (ref 70–99)

## 2020-10-04 LAB — MAGNESIUM: Magnesium: 1.3 mg/dL — ABNORMAL LOW (ref 1.7–2.4)

## 2020-10-04 LAB — AMYLASE: Amylase: 93 U/L (ref 28–100)

## 2020-10-04 MED ORDER — ALBUTEROL SULFATE HFA 108 (90 BASE) MCG/ACT IN AERS
2.0000 | INHALATION_SPRAY | Freq: Four times a day (QID) | RESPIRATORY_TRACT | Status: DC | PRN
  Administered 2020-10-04 – 2020-10-05 (×2): 2 via RESPIRATORY_TRACT
  Filled 2020-10-04: qty 6.7

## 2020-10-04 MED ORDER — BISACODYL 10 MG RE SUPP
10.0000 mg | Freq: Once | RECTAL | Status: AC
  Administered 2020-10-04: 10 mg via RECTAL
  Filled 2020-10-04: qty 1

## 2020-10-04 MED ORDER — INSULIN ASPART 100 UNIT/ML ~~LOC~~ SOLN
0.0000 [IU] | Freq: Three times a day (TID) | SUBCUTANEOUS | Status: DC
  Administered 2020-10-04 – 2020-10-06 (×3): 2 [IU] via SUBCUTANEOUS

## 2020-10-04 MED ORDER — SODIUM CHLORIDE 0.9 % IV SOLN: 500.0000 mL | Freq: Once | INTRAVENOUS | Status: DC | PRN

## 2020-10-04 MED ORDER — MAGNESIUM SULFATE 4 GM/100ML IV SOLN
4.0000 g | Freq: Once | INTRAVENOUS | Status: AC
  Administered 2020-10-04: 4 g via INTRAVENOUS
  Filled 2020-10-04: qty 100

## 2020-10-04 MED ORDER — HYDROMORPHONE HCL 1 MG/ML IJ SOLN
0.5000 mg | INTRAMUSCULAR | Status: DC | PRN
  Administered 2020-10-04 – 2020-10-05 (×4): 0.5 mg via INTRAVENOUS
  Filled 2020-10-04: qty 1
  Filled 2020-10-04 (×3): qty 0.5

## 2020-10-04 MED ORDER — ORAL CARE MOUTH RINSE
15.0000 mL | Freq: Two times a day (BID) | OROMUCOSAL | Status: DC
  Administered 2020-10-04 – 2020-10-11 (×12): 15 mL via OROMUCOSAL

## 2020-10-04 MED ORDER — ACETAMINOPHEN 10 MG/ML IV SOLN
500.0000 mg | Freq: Four times a day (QID) | INTRAVENOUS | Status: AC | PRN
  Administered 2020-10-04: 500 mg via INTRAVENOUS
  Filled 2020-10-04: qty 50

## 2020-10-04 MED FILL — Sodium Chloride IV Soln 0.9%: INTRAVENOUS | Qty: 1000 | Status: AC

## 2020-10-04 MED FILL — Sodium Chloride Irrigation Soln 0.9%: Qty: 3000 | Status: AC

## 2020-10-04 MED FILL — Heparin Sodium (Porcine) Inj 1000 Unit/ML: INTRAMUSCULAR | Qty: 30 | Status: AC

## 2020-10-04 NOTE — Progress Notes (Addendum)
[facility] Progress Note    10/04/2020 7:16 AM 1 Day Post-Op  Subjective:  C/o soreness.  Says he has some burning in his thighs to mid calves.  His feet feel better.  He denies passing any gas.    Afebrile HR 50's-60's NSR/SB 209'O-709'G systolic 28% 3MO2HU  Gtts:  none  Vitals:   10/04/20 0300 10/04/20 0400  BP: 121/68 106/60  Pulse: 63 62  Resp: 15 11  Temp:  97.6 F (36.4 C)  SpO2: 96% 95%    Physical Exam: Cardiac:  regular Lungs:  Coarse BS left base Abdomen:  Distended; minimal bowel sounds heard; -flatus; -BM Incisions:  Laparotomy and bilateral groin incisions are clean and dry. Extremities:  Palpable right DP/PT; monophasic doppler left DP/PT; bilateral feet are warm. Calves are soft and non tender.  General:  No distress  CBC    Component Value Date/Time   WBC 10.8 (H) 10/04/2020 0332   RBC 3.59 (L) 10/04/2020 0332   HGB 10.9 (L) 10/04/2020 0332   HGB 16.1 11/02/2019 1000   HCT 33.9 (L) 10/04/2020 0332   HCT 47.2 11/02/2019 1000   PLT 90 (L) 10/04/2020 0332   PLT 177 11/02/2019 1000   MCV 94.4 10/04/2020 0332   MCV 90 11/02/2019 1000   MCH 30.4 10/04/2020 0332   MCHC 32.2 10/04/2020 0332   RDW 14.4 10/04/2020 0332   RDW 13.6 11/02/2019 1000   LYMPHSABS 1.7 11/28/2019 0404   LYMPHSABS 2.5 11/02/2019 1000   MONOABS 1.1 (H) 11/28/2019 0404   EOSABS 0.1 11/28/2019 0404   EOSABS 0.2 11/02/2019 1000   BASOSABS 0.0 11/28/2019 0404   BASOSABS 0.1 11/02/2019 1000    BMET    Component Value Date/Time   NA 133 (L) 10/04/2020 0332   NA 139 04/16/2020 1046   K 5.1 10/04/2020 0332   CL 101 10/04/2020 0332   CO2 21 (L) 10/04/2020 0332   GLUCOSE 270 (H) 10/04/2020 0332   BUN 21 10/04/2020 0332   BUN 14 04/16/2020 1046   CREATININE 1.88 (H) 10/04/2020 0332   CREATININE 0.77 06/29/2017 1534   CALCIUM 6.9 (L) 10/04/2020 0332   GFRNONAA 35 (L) 10/04/2020 0332   GFRAA 98 04/16/2020 1046    INR    Component Value Date/Time   INR 1.3 (H) 10/03/2020  1621     Intake/Output Summary (Last 24 hours) at 10/04/2020 0716 Last data filed at 10/04/2020 0400 Gross per 24 hour  Intake 7090.32 ml  Output 2345 ml  Net 4745.32 ml     Assessment/Plan:  69 y.o. male is s/p   #1: Aortobifemoral bypass graft (14 x 7 dacryon) #2: Reimplantation of the inferior mesenteric artery (inferior mesenteric artery bypass) #3: Aortic endarterectomy #4: Bilateral common femoral, superficial femoral, and profundofemoral endarterectomy 1 Day Post-Op  -Vascular:  Palpable right pedal pulses and brisk monophasic doppler signals left DP/PT -Cardiac:  Regular; hemodynamically stable and not requiring any pressor support.  -Pulmonary:  Pt 91% 2LO2NC.  Needs to work on IS and will start mobilizing pt out of bed -Neuro:  In tact -Endocrine/Renal:  Creatinine increased from 0.87 pre op to 1.88 this am with only 595cc out since surgery.  He has mild hyperkalemia with K+ of 5.1.  Hyperglycemia this am-moderate SSI ordered.  Continue IVF @ 125cc/hr without K+.  Received fluid bolus this am will give another fluid bolus.   Leave foley to monitor UOP.   Check labs in am.   -GI:  Minimal BS without any flatus.  NGT with 500cc out.  Will leave NGT today.  Continue npo.  Will give dulcolax suppository to stimulate bowels.  -Incisions:  Laparotomy incision and bilateral groins look good.   -Heme/ID:  Acute surgical blood loss anemia-pt is tolerating.  Pt remains afebrile.  Thrombocytopenia with platelet count of 90k.  Will continue to monitor.   -General:  No distress; feet feeling better from pre-op.  Leave in ICU today.  PT/OT eval in place Will order IV tylenol for pain.  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  He states he can take synthetic codeine.  When he had cardiac surgery last December, he was discharged with Tramadol.     Leontine Locket, PA-C [facility] [phone] 10/04/2020 7:16 AM Pt seen and examined with Dr. Trula Slade  I agree  with the above.  I have seen and evaluated the patient.  This is a 69 year old gentleman who is postoperative day #1 status post aortobifemoral bypass graft.  He required a fluid bolus overnight.  His pain is moderately controlled.  He denies flatus.  Pulmonary: Encourage incentive incentive spirometer.  Wean oxygen Renal: Slight increase in renal function today.  This is likely secondary to volume status.  I did have to ligate an accessory renal artery on both sides due to their location.  He will be hydrated today.  Nephrotoxic meds will be held.  Labs will be repeated in the morning.  I will keep his Foley in 1 more day to monitor his urine output. GI: NG tube with 500 cc drainage overnight.  He is without flatus.  He will remain n.p.o. and I will keep his NG tube in place. Acute postsurgical blood loss anemia: Patient has not required transfusion.  His hemoglobin is stable.  We will continue to monitor this. Activity: He needs to be up and walking today. Disposition: We will maintain him in the ICU for 1 more day  [employer]cks[employer] A-line will be discontinued today.

## 2020-10-04 NOTE — Evaluation (Signed)
Occupational Therapy Evaluation Patient Details Name: Gregory [name] MRN: [id number] DOB: 11/20/1951 Today's Date: 10/04/2020    History of Present Illness 69 yo admitted with bil LE claudication s/p aortobifem BPG and aortic endarterectomy. PMhx: PAD, CAD, CABG/AVR, bipolar, HLD, HTN   Clinical Impression   Pt was independent prior to admission. Presents with significant burning pain in LEs and impaired balance and generalized weakness. Pt requires min to total assist for ADL, mod assist for bed mobility and min assist for ambulation with RW. Pt likely to progress well and not require post acute OT. He will go home with his supportive wife. Will follow acutely.    Follow Up Recommendations  No OT follow up    Equipment Recommendations  None recommended by OT    Recommendations for Other Services       Precautions / Restrictions Precautions Precautions: Fall Precaution Comments: NGT      Mobility Bed Mobility Overal bed mobility: Needs Assistance Bed Mobility: Rolling;Sidelying to Sit Rolling: Mod assist Sidelying to sit: Mod assist       General bed mobility comments: cues for sequence with assist to roll and rise, cues for breathing  Transfers Overall transfer level: Needs assistance Equipment used: Rolling walker (2 wheeled) Transfers: Sit to/from Stand Sit to Stand: Min guard         General transfer comment: cues for hand placement and posture in standing    Balance Overall balance assessment: Needs assistance Sitting-balance support: Feet supported Sitting balance-Leahy Scale: Fair     Standing balance support: Bilateral upper extremity supported Standing balance-Leahy Scale: Poor                             ADL either performed or assessed with clinical judgement   ADL Overall ADL's : Needs assistance/impaired Eating/Feeding: NPO   Grooming: Oral care;Standing;Minimal assistance   Upper Body Bathing: Moderate  assistance;Sitting   Lower Body Bathing: Total assistance;Sit to/from stand   Upper Body Dressing : Minimal assistance;Bed level   Lower Body Dressing: Total assistance;Sit to/from stand   Toilet Transfer: Minimal assistance;Ambulation;RW   Toileting- Clothing Manipulation and Hygiene: Total assistance;Bed level       Functional mobility during ADLs: Minimal assistance;Rolling walker       Vision Baseline Vision/History: Wears glasses Wears Glasses: At all times Patient Visual Report: No change from baseline       Perception     Praxis      Pertinent Vitals/Pain Pain Assessment: Faces Pain Score: 7  Faces Pain Scale: Hurts whole lot Pain Location: bil LE Pain Descriptors / Indicators: Aching;Burning Pain Intervention(s): Monitored during session;Repositioned;Premedicated before session     Hand Dominance Left   Extremity/Trunk Assessment Upper Extremity Assessment Upper Extremity Assessment: Overall WFL for tasks assessed   Lower Extremity Assessment Lower Extremity Assessment: Defer to PT evaluation RLE Deficits / Details: decreased hip extension and knee flexion bil due to pain LLE Deficits / Details: decreased hip extension and knee flexion bil due to pain   Cervical / Trunk Assessment Cervical / Trunk Assessment: Other exceptions Cervical / Trunk Exceptions: kyphotic posture guarding abdomen due to pain   Communication Communication Communication: No difficulties   Cognition Arousal/Alertness: Awake/alert Behavior During Therapy: WFL for tasks assessed/performed Overall Cognitive Status: Within Functional Limits for tasks assessed  General Comments: distracted at times by pain   General Comments       Exercises     Shoulder Instructions      Home Living Family/patient expects to be discharged to:: Private residence Living Arrangements: Spouse/significant other Available Help at Discharge:  Family;Available 24 hours/day Type of Home: House Home Access: Stairs to enter [employer] of Steps: 4 Entrance Stairs-Rails: Left Home Layout: One level     Bathroom Shower/Tub: Teacher, early years/pre: Handicapped height     Home Equipment: Environmental consultant - 2 wheels;Cane - single point;Crutches          Prior Functioning/Environment Level of Independence: Independent                 OT Problem List: Impaired balance (sitting and/or standing);Decreased knowledge of use of DME or AE;Pain      OT Treatment/Interventions: Self-care/ADL training;DME and/or AE instruction;Patient/family education    OT Goals(Current goals can be found in the care plan section) Acute Rehab OT Goals Patient Stated Goal: be able to keep up with my grandkids OT Goal Formulation: With patient Time For Goal Achievement: 10/18/20 Potential to Achieve Goals: Good ADL Goals Pt Will Perform Grooming: with supervision;standing Pt Will Perform Lower Body Bathing: with supervision;sit to/from stand Pt Will Perform Lower Body Dressing: with supervision;sit to/from stand Pt Will Transfer to Toilet: with supervision;ambulating Pt Will Perform Toileting - Clothing Manipulation and hygiene: with supervision;sit to/from stand Pt Will Perform Tub/Shower Transfer: Tub transfer;ambulating;rolling walker Additional ADL Goal #1: Pt will perform bed mobility modified independently.  OT Frequency: Min 2X/week   Barriers to D/C:            Co-evaluation              AM-PAC OT "6 Clicks" Daily Activity     Outcome Measure Help from another person eating meals?: None Help from another person taking care of personal grooming?: A Little Help from another person toileting, which includes using toliet, bedpan, or urinal?: A Lot Help from another person bathing (including washing, rinsing, drying)?: A Lot Help from another person to put on and taking off regular upper body clothing?: A  Little Help from another person to put on and taking off regular lower body clothing?: Total 6 Click Score: 15   End of Session Equipment Utilized During Treatment: Rolling walker;Oxygen (2L) Nurse Communication: Mobility status  Activity Tolerance: Patient limited by pain Patient left: in chair;with call bell/phone within reach;with family/visitor present  OT Visit Diagnosis: Unsteadiness on feet (R26.81);Other abnormalities of gait and mobility (R26.89);Pain                Time: 1130-1207 OT Time Calculation (min): 37 min Charges:  OT General Charges $OT Visit: 1 Visit OT Evaluation $OT Eval Moderate Complexity: 1 Mod  Gregory [name], OTR/L Acute Rehabilitation Services Pager: [phone] Office: [phone]  Gregory [name] 10/04/2020, 2:08 PM

## 2020-10-04 NOTE — Evaluation (Signed)
Physical Therapy Evaluation Patient Details Name: Gregory [name] MRN: [id number] DOB: [birth year] Today's Date: 10/04/2020   History of Present Illness  69 yo admitted with bil LE claudication s/p aortobifem BPG and aortic endarterectomy. PMhx: PAD, CAD, CABG/AVR, bipolar, HLD, HTN  Clinical Impression  Pt pleasant and concerned with possible need for BM, pain and progression. Pt willing to attempt mobility with pain medication and able to get OOB with assist and walk in hallway. Pt limited by abdominal and leg pain but understanding of need for mobility and encouraged further mobility with nursing. Pt with decreased strength, transfers, function and gait who will benefit from acute therapy to maximize mobility, safety and independence to decrease burden of care.   92% on 2L HR 87    Follow Up Recommendations Home health PT    Equipment Recommendations  None recommended by PT    Recommendations for Other Services       Precautions / Restrictions Precautions Precautions: Fall Precaution Comments: NGT      Mobility  Bed Mobility Overal bed mobility: Needs Assistance Bed Mobility: Rolling;Sidelying to Sit Rolling: Mod assist Sidelying to sit: Mod assist       General bed mobility comments: cues for sequence with assist to roll and rise, cues for breathing  Transfers Overall transfer level: Needs assistance   Transfers: Sit to/from Stand Sit to Stand: Min guard         General transfer comment: cues for hand placement and posture in standing  Ambulation/Gait Ambulation/Gait assistance: Min assist Gait Distance (Feet): 120 Feet Assistive device: Rolling walker (2 wheeled) Gait Pattern/deviations: Step-to pattern;Decreased stride length;Shuffle;Trunk flexed   Gait velocity interpretation: 1.31 - 2.62 ft/sec, indicative of limited community ambulator General Gait Details: pt with short strides, flexed trunk guarding against abdominal extension and pain. cues for  posture, stride, position in RW and direction. Pt walked 120' then ride in chair back to his room  Stairs            Wheelchair Mobility    Modified Rankin (Stroke Patients Only)       Balance Overall balance assessment: Needs assistance Sitting-balance support: Feet supported Sitting balance-Leahy Scale: Fair     Standing balance support: Bilateral upper extremity supported Standing balance-Leahy Scale: Poor                               Pertinent Vitals/Pain Pain Assessment: 0-10 Pain Score: 7  Pain Location: bil LE Pain Descriptors / Indicators: Aching;Burning Pain Intervention(s): Limited activity within patient's tolerance;Monitored during session;Premedicated before session;Repositioned    Home Living Family/patient expects to be discharged to:: Private residence Living Arrangements: Spouse/significant other Available Help at Discharge: Family;Available 24 hours/day Type of Home: House Home Access: Stairs to enter Entrance Stairs-Rails: Left Entrance Stairs-Number of Steps: 4 Home Layout: One level Home Equipment: Walker - 2 wheels;Cane - single point;Crutches      Prior Function Level of Independence: Independent               Hand Dominance        Extremity/Trunk Assessment   Upper Extremity Assessment Upper Extremity Assessment: Defer to OT evaluation    Lower Extremity Assessment Lower Extremity Assessment: RLE deficits/detail;LLE deficits/detail RLE Deficits / Details: decreased hip extension and knee flexion bil due to pain LLE Deficits / Details: decreased hip extension and knee flexion bil due to pain    Cervical / Trunk Assessment Cervical / Trunk Assessment:  Other exceptions Cervical / Trunk Exceptions: kyphotic posture guarding abdomen due to pain  Communication   Communication: No difficulties  Cognition Arousal/Alertness: Awake/alert Behavior During Therapy: WFL for tasks assessed/performed Overall Cognitive  Status: Within Functional Limits for tasks assessed                                        General Comments      Exercises     Assessment/Plan    PT Assessment Patient needs continued PT services  PT Problem List Decreased strength;Decreased mobility;Decreased range of motion;Decreased activity tolerance;Decreased balance;Decreased knowledge of use of DME;Pain       PT Treatment Interventions Gait training;Balance training;Stair training;Functional mobility training;Therapeutic activities;Patient/family education;DME instruction;Therapeutic exercise    PT Goals (Current goals can be found in the Care Plan section)  Acute Rehab PT Goals Patient Stated Goal: be able to keep up with my grandkids PT Goal Formulation: With patient/family Time For Goal Achievement: 10/18/20 Potential to Achieve Goals: Good    Frequency Min 3X/week   Barriers to discharge        Co-evaluation               AM-PAC PT "6 Clicks" Mobility  Outcome Measure Help needed turning from your back to your side while in a flat bed without using bedrails?: A Lot Help needed moving from lying on your back to sitting on the side of a flat bed without using bedrails?: A Lot Help needed moving to and from a bed to a chair (including a wheelchair)?: A Little Help needed standing up from a chair using your arms (e.g., wheelchair or bedside chair)?: A Little Help needed to walk in hospital room?: A Little Help needed climbing 3-5 steps with a railing? : A Lot 6 Click Score: 15    End of Session   Activity Tolerance: Patient limited by pain;Patient tolerated treatment well Patient left: in chair;with call bell/phone within reach;with family/visitor present Nurse Communication: Mobility status PT Visit Diagnosis: Other abnormalities of gait and mobility (R26.89);Difficulty in walking, not elsewhere classified (R26.2);Muscle weakness (generalized) (M62.81);Pain Pain - Right/Left: Right Pain  - part of body: Leg    Time: 1136-1206 PT Time Calculation (min) (ACUTE ONLY): 30 min   Charges:   PT Evaluation $PT Eval Moderate Complexity: 1 Mod          Sache Sane P, PT Acute Rehabilitation Services Pager: [phone] Office: [phone]   Darvis Croft B Roby Spalla 10/04/2020, 1:45 PM

## 2020-10-04 NOTE — Progress Notes (Signed)
PT Cancellation Note  Patient Details Name: Gregory [name] MRN: [id number] DOB: 04/18/1951   Cancelled Treatment:    Reason Eval/Treat Not Completed: Other (comment) (attempted x 2 pt getting art line pulled then on the bedpan)   [place] 10/04/2020, 10:54 AM  Bayard Males, PT Acute Rehabilitation Services Pager: [phone] Office: [phone]

## 2020-10-04 NOTE — Anesthesia Postprocedure Evaluation (Signed)
Anesthesia Post Note  Patient: Gregory [name]  Procedure(s) Performed: AORTA BIFEMORAL BYPASS GRAFT USING A HEMASHIELD GOLD BIFURCATED 14 X 88mm GRAFT AND INFERIOR MESENTERIC ARTERY REIMPLANTATION (N/A ) BILATERAL FEMORAL ENDARTERECTOMY (Bilateral ) AORTIC ENDARTERECTOMY (N/A )     Patient location during evaluation: PACU Anesthesia Type: General Level of consciousness: awake Pain management: pain level controlled Vital Signs Assessment: post-procedure vital signs reviewed and stable Respiratory status: spontaneous breathing, nonlabored ventilation, respiratory function stable and patient connected to nasal cannula oxygen Cardiovascular status: blood pressure returned to baseline and stable Postop Assessment: no apparent nausea or vomiting Anesthetic complications: no   No complications documented.  Last Vitals:  Vitals:   10/04/20 1928 10/04/20 1930  BP:  (!) 130/107  Pulse:  70  Resp:  (!) 21  Temp: 36.7 C   SpO2:  95%    Last Pain:  Vitals:   10/04/20 1928  TempSrc: Oral  PainSc:                  Camillia Marcy P Ori Trejos

## 2020-10-05 LAB — CBC
HCT: 32.1 % — ABNORMAL LOW (ref 39.0–52.0)
Hemoglobin: 10.6 g/dL — ABNORMAL LOW (ref 13.0–17.0)
MCH: 30.9 pg (ref 26.0–34.0)
MCHC: 33 g/dL (ref 30.0–36.0)
MCV: 93.6 fL (ref 80.0–100.0)
Platelets: 75 10*3/uL — ABNORMAL LOW (ref 150–400)
RBC: 3.43 MIL/uL — ABNORMAL LOW (ref 4.22–5.81)
RDW: 14.6 % (ref 11.5–15.5)
WBC: 12.4 10*3/uL — ABNORMAL HIGH (ref 4.0–10.5)
nRBC: 0 % (ref 0.0–0.2)

## 2020-10-05 LAB — COMPREHENSIVE METABOLIC PANEL
ALT: 30 U/L (ref 0–44)
AST: 64 U/L — ABNORMAL HIGH (ref 15–41)
Albumin: 3.1 g/dL — ABNORMAL LOW (ref 3.5–5.0)
Alkaline Phosphatase: 30 U/L — ABNORMAL LOW (ref 38–126)
Anion gap: 8 (ref 5–15)
BUN: 29 mg/dL — ABNORMAL HIGH (ref 8–23)
CO2: 21 mmol/L — ABNORMAL LOW (ref 22–32)
Calcium: 7.6 mg/dL — ABNORMAL LOW (ref 8.9–10.3)
Chloride: 108 mmol/L (ref 98–111)
Creatinine, Ser: 1.29 mg/dL — ABNORMAL HIGH (ref 0.61–1.24)
GFR, Estimated: 56 mL/min — ABNORMAL LOW (ref >60)
Glucose, Bld: 116 mg/dL — ABNORMAL HIGH (ref 70–99)
Potassium: 4.8 mmol/L (ref 3.5–5.1)
Sodium: 137 mmol/L (ref 135–145)
Total Bilirubin: 0.4 mg/dL (ref 0.3–1.2)
Total Protein: 5.4 g/dL — ABNORMAL LOW (ref 6.5–8.1)

## 2020-10-05 LAB — HEMOGLOBIN A1C
Hgb A1c MFr Bld: 5.9 % — ABNORMAL HIGH (ref 4.8–5.6)
Mean Plasma Glucose: 123 mg/dL

## 2020-10-05 LAB — GLUCOSE, CAPILLARY
Glucose-Capillary: 105 mg/dL — ABNORMAL HIGH (ref 70–99)
Glucose-Capillary: 108 mg/dL — ABNORMAL HIGH (ref 70–99)
Glucose-Capillary: 107 mg/dL — ABNORMAL HIGH (ref 70–99)

## 2020-10-05 MED ORDER — BISACODYL 10 MG RE SUPP
10.0000 mg | Freq: Once | RECTAL | Status: AC
  Administered 2020-10-05: 10 mg via RECTAL
  Filled 2020-10-05: qty 1

## 2020-10-05 MED ORDER — MOMETASONE FURO-FORMOTEROL FUM 200-5 MCG/ACT IN AERO
2.0000 | INHALATION_SPRAY | Freq: Two times a day (BID) | RESPIRATORY_TRACT | Status: DC
  Administered 2020-10-05 – 2020-10-11 (×13): 2 via RESPIRATORY_TRACT
  Filled 2020-10-05: qty 8.8

## 2020-10-05 MED ORDER — ASPIRIN EC 325 MG PO TBEC
325.0000 mg | DELAYED_RELEASE_TABLET | Freq: Every day | ORAL | Status: DC
  Administered 2020-10-05 – 2020-10-11 (×7): 325 mg via ORAL
  Filled 2020-10-05 (×7): qty 1

## 2020-10-05 MED ORDER — FINASTERIDE 5 MG PO TABS
5.0000 mg | ORAL_TABLET | Freq: Every day | ORAL | Status: DC
  Administered 2020-10-05 – 2020-10-11 (×7): 5 mg via ORAL
  Filled 2020-10-05 (×6): qty 1

## 2020-10-05 MED ORDER — HYDROMORPHONE HCL 1 MG/ML IJ SOLN: 0.5000 mg | INTRAMUSCULAR | Status: DC | PRN

## 2020-10-05 NOTE — Progress Notes (Signed)
NG tube discontinued per verbal order without difficulty.   OOB per request 1 x transfer.  Pt states he feels better.

## 2020-10-05 NOTE — Progress Notes (Signed)
Physical Therapy Treatment Patient Details Name: Gregory [name] MRN: [id number] DOB: 1951/01/25 Today's Date: 10/05/2020    History of Present Illness 69 yo admitted with bil LE claudication s/p aortobifem BPG and aortic endarterectomy. PMhx: PAD, CAD, CABG/AVR, bipolar, HLD, HTN    PT Comments    Pt sitting in chair on arrival reporting continued bil LE burning pain. Pt only taking tylenol and states he didn't want to take Dilaudid because he was confused/disoriented this morning after having it. Pt remains limited in all mobility but pain but demonstrates increased knee flexion and transfers. Pt educated for progression.   SpO2 92% on RA at rest but with stand and taking only a few steps desaturated to 87% with 2L applied for gait at 94% HR 69-85   Follow Up Recommendations  Home health PT     Equipment Recommendations  None recommended by PT    Recommendations for Other Services       Precautions / Restrictions Precautions Precautions: Fall    Mobility  Bed Mobility               General bed mobility comments: in chair on arrival  Transfers Overall transfer level: Needs assistance   Transfers: Sit to/from Stand Sit to Stand: Min guard         General transfer comment: cues for hand placement and posture in standing  Ambulation/Gait Ambulation/Gait assistance: Min guard Gait Distance (Feet): 90 Feet Assistive device: Rolling walker (2 wheeled) Gait Pattern/deviations: Step-to pattern;Decreased stride length;Shuffle;Trunk flexed   Gait velocity interpretation: 1.31 - 2.62 ft/sec, indicative of limited community ambulator General Gait Details: pt with short strides, flexed trunk guarding against abdominal extension and pain. cues for posture, stride, position in RW and direction. pt self limiting distance but did not require chair follow this session   Stairs             Wheelchair Mobility    Modified Rankin (Stroke Patients Only)        Balance Overall balance assessment: Needs assistance                                          Cognition Arousal/Alertness: Awake/alert Behavior During Therapy: WFL for tasks assessed/performed Overall Cognitive Status: Within Functional Limits for tasks assessed                                        Exercises General Exercises - Lower Extremity Long Arc Quad: AROM;Both;Seated;15 reps Hip Flexion/Marching: AROM;Both;Seated;15 reps    General Comments        Pertinent Vitals/Pain Pain Score: 7  Pain Location: bil LE Pain Descriptors / Indicators: Aching;Burning Pain Intervention(s): Limited activity within patient's tolerance;Monitored during session;Premedicated before session;Repositioned    Home Living                      Prior Function            PT Goals (current goals can now be found in the care plan section) Progress towards PT goals: Progressing toward goals    Frequency    Min 3X/week      PT Plan Current plan remains appropriate    Co-evaluation              AM-PAC PT "6  Clicks" Mobility   Outcome Measure  Help needed turning from your back to your side while in a flat bed without using bedrails?: A Lot Help needed moving from lying on your back to sitting on the side of a flat bed without using bedrails?: A Lot Help needed moving to and from a bed to a chair (including a wheelchair)?: A Little Help needed standing up from a chair using your arms (e.g., wheelchair or bedside chair)?: A Little Help needed to walk in hospital room?: A Little Help needed climbing 3-5 steps with a railing? : A Lot 6 Click Score: 15    End of Session   Activity Tolerance: Patient limited by pain Patient left: in chair;with call bell/phone within reach Nurse Communication: Mobility status PT Visit Diagnosis: Other abnormalities of gait and mobility (R26.89);Difficulty in walking, not elsewhere classified  (R26.2);Muscle weakness (generalized) (M62.81);Pain     Time: 1005-1026 PT Time Calculation (min) (ACUTE ONLY): 21 min  Charges:  $Gait Training: 8-22 mins                     Bayard Males, PT Acute Rehabilitation Services Pager: [phone] Office: [place] 10/05/2020, 12:50 PM

## 2020-10-05 NOTE — Progress Notes (Signed)
Verbal order from on call Vascular Surgery MD to transfer pt due to unit census

## 2020-10-05 NOTE — Progress Notes (Addendum)
[facility] Progress Note    10/05/2020 7:09 AM 2 Days Post-Op  Subjective:  Has had a bad night.  Still has the burning in his right thigh, c/o dry throat and hard to do IS; says he did have a BM yesterday; passing occasional flatus.   Has questions about his pain medication with Tylenol and dilaudid.   Afebrile HR 60's-90's  814'G-818'H systolic 63% RA  Gtts:  none  Vitals:   10/05/20 0025 10/05/20 0431  BP: (!) 158/72 138/67  Pulse: 75 75  Resp: 16 16  Temp: 98.4 F (36.9 C) 98.5 F (36.9 C)  SpO2: 97% 94%    Physical Exam: Cardiac:  regular Lungs:  Wheezing with coarse BS bilateral bases Abdomen:  Mild distention with occasional BS; +occasional flatus; +BM Incisions:  Midline and bilateral groin incisions are clean and dry Extremities:  Easily palpable right DP/PT and left foot is warm and well perfused.   General:  Appears slightly uncomfortable  CBC    Component Value Date/Time   WBC 12.4 (H) 10/05/2020 0154   RBC 3.43 (L) 10/05/2020 0154   HGB 10.6 (L) 10/05/2020 0154   HGB 16.1 11/02/2019 1000   HCT 32.1 (L) 10/05/2020 0154   HCT 47.2 11/02/2019 1000   PLT 75 (L) 10/05/2020 0154   PLT 177 11/02/2019 1000   MCV 93.6 10/05/2020 0154   MCV 90 11/02/2019 1000   MCH 30.9 10/05/2020 0154   MCHC 33.0 10/05/2020 0154   RDW 14.6 10/05/2020 0154   RDW 13.6 11/02/2019 1000   LYMPHSABS 1.7 11/28/2019 0404   LYMPHSABS 2.5 11/02/2019 1000   MONOABS 1.1 (H) 11/28/2019 0404   EOSABS 0.1 11/28/2019 0404   EOSABS 0.2 11/02/2019 1000   BASOSABS 0.0 11/28/2019 0404   BASOSABS 0.1 11/02/2019 1000    BMET    Component Value Date/Time   NA 137 10/05/2020 0154   NA 139 04/16/2020 1046   K 4.8 10/05/2020 0154   CL 108 10/05/2020 0154   CO2 21 (L) 10/05/2020 0154   GLUCOSE 116 (H) 10/05/2020 0154   BUN 29 (H) 10/05/2020 0154   BUN 14 04/16/2020 1046   CREATININE 1.29 (H) 10/05/2020 0154   CREATININE 0.77 06/29/2017 1534   CALCIUM 7.6 (L) 10/05/2020 0154   GFRNONAA  56 (L) 10/05/2020 0154   GFRAA 98 04/16/2020 1046    INR    Component Value Date/Time   INR 1.3 (H) 10/03/2020 1621     Intake/Output Summary (Last 24 hours) at 10/05/2020 0709 Last data filed at 10/05/2020 0432 Gross per 24 hour  Intake 2192.7 ml  Output 1225 ml  Net 967.7 ml     Assessment/Plan:  69 y.o. male is s/p  #1: Aortobifemoral bypass graft (14 x 7 dacryon) #2: Reimplantation of the inferior mesenteric artery (inferior mesenteric artery bypass) #3: Aortic endarterectomy #4: Bilateral common femoral, superficial femoral, and profundofemoral endarterectomy 2 Days Post-Op  -Vascular:  Easily palpable pulses right DP/PT; left foot is warm and well perfused.   -Cardiac:  Regular; hemodynamically stable; short burst afib at 4am this morning but back in NSR now. Per RN, reported hx of afib but not listed in PMH.  Reviewing cardiology notes, does not mention afib, but does mention heart block after CABG/AVR. Continue to monitor.  If he develops again, will get EKG and consult cardiology. -Pulmonary:  Wheezing on 2LO2NC; needs pulmonary toilet and IS and mobilize out of bed.  Will ask respiratory therapy to see pt.  Spoke with RN to  mobilize pt and pulmonary toilet.  -Neuro:  In tact -Renal:  Creatinine has improved and creatinine is 1.29 down from 1.88 and UOP improved.  Hyperkalemia also improved.  Decrease fluids today given pt's pulmonary status also.  May need gentle dose of lasix.   -GI:  +BM yesterday and passing occasional flatus;  Increased BS from yesterday.  NGT output 200cc/24hr.  Most likely can d/c NGT and start clear liquids slowly.   -Incisions:  Midline and bilateral groin incisions are clean and dry.  -Heme/ID:  Acute surgical blood loss anemia-hgb stable and pt hemodynamically stable  -pain control-pt has IV tylenol ordered for q6h prn as well as dilaudid q3h prn.  He states that he was somewhat disoriented after the dilaudid.  I have changed this to q4h prn.   When he is taking po's, will start tramadol for pain instead of IV medication.    DVT prophylaxis:  Sq heparin   Leontine Locket, PA-C [facility] [phone] 10/05/2020 7:09 AM  I have seen and evaluated the patient. I agree with the PA note as documented above.  Status post aortobifem with Dr. Trula Slade.  Was moved out of the ICU to the floor last night.  Main issue this morning is having some wheezing, states he has asthma as baseline.  He already has Ventolin ordered and will add his Advair this am and have asked respiratory therapy to see him.  Will discontinue his NG tube given output of only 200 mL and had a bowel movement last night.  All his incisions look good.  He has good Doppler signals bilaterally DP PT.  Urine output over 1 L yesterday.  He had AKI postop but  creatinine is improved today down from 1.88 to 1.29.  Hemoglobin stable at 10.6.  Marty Heck, MD [facility] [place] Office: [phone]

## 2020-10-06 LAB — BASIC METABOLIC PANEL
Anion gap: 11 (ref 5–15)
BUN: 24 mg/dL — ABNORMAL HIGH (ref 8–23)
CO2: 19 mmol/L — ABNORMAL LOW (ref 22–32)
Calcium: 8 mg/dL — ABNORMAL LOW (ref 8.9–10.3)
Chloride: 111 mmol/L (ref 98–111)
Creatinine, Ser: 0.92 mg/dL (ref 0.61–1.24)
GFR, Estimated: >60 mL/min (ref >60)
Glucose, Bld: 109 mg/dL — ABNORMAL HIGH (ref 70–99)
Potassium: 4.6 mmol/L (ref 3.5–5.1)
Sodium: 141 mmol/L (ref 135–145)

## 2020-10-06 LAB — CBC
HCT: 30 % — ABNORMAL LOW (ref 39.0–52.0)
Hemoglobin: 10 g/dL — ABNORMAL LOW (ref 13.0–17.0)
MCH: 31.2 pg (ref 26.0–34.0)
MCHC: 33.3 g/dL (ref 30.0–36.0)
MCV: 93.5 fL (ref 80.0–100.0)
Platelets: 82 10*3/uL — ABNORMAL LOW (ref 150–400)
RBC: 3.21 MIL/uL — ABNORMAL LOW (ref 4.22–5.81)
RDW: 14.6 % (ref 11.5–15.5)
WBC: 12.4 10*3/uL — ABNORMAL HIGH (ref 4.0–10.5)
nRBC: 0 % (ref 0.0–0.2)

## 2020-10-06 LAB — GLUCOSE, CAPILLARY
Glucose-Capillary: 105 mg/dL — ABNORMAL HIGH (ref 70–99)
Glucose-Capillary: 137 mg/dL — ABNORMAL HIGH (ref 70–99)
Glucose-Capillary: 91 mg/dL (ref 70–99)
Glucose-Capillary: 140 mg/dL — ABNORMAL HIGH (ref 70–99)

## 2020-10-06 LAB — MAGNESIUM: Magnesium: 2.7 mg/dL — ABNORMAL HIGH (ref 1.7–2.4)

## 2020-10-06 MED ORDER — DM-GUAIFENESIN ER 30-600 MG PO TB12
1.0000 | ORAL_TABLET | Freq: Two times a day (BID) | ORAL | Status: DC | PRN
  Administered 2020-10-06 – 2020-10-10 (×4): 1 via ORAL
  Filled 2020-10-06 (×4): qty 1

## 2020-10-06 MED ORDER — DIVALPROEX SODIUM 500 MG PO DR TAB
1000.0000 mg | DELAYED_RELEASE_TABLET | Freq: Every day | ORAL | Status: DC
  Administered 2020-10-06 – 2020-10-10 (×5): 1000 mg via ORAL
  Filled 2020-10-06 (×6): qty 2

## 2020-10-06 MED ORDER — ATORVASTATIN CALCIUM 80 MG PO TABS
80.0000 mg | ORAL_TABLET | Freq: Every day | ORAL | Status: DC
  Administered 2020-10-06 – 2020-10-11 (×6): 80 mg via ORAL
  Filled 2020-10-06 (×6): qty 1

## 2020-10-06 MED ORDER — LAMOTRIGINE 100 MG PO TABS
200.0000 mg | ORAL_TABLET | Freq: Every morning | ORAL | Status: DC
  Administered 2020-10-06 – 2020-10-11 (×6): 200 mg via ORAL
  Filled 2020-10-06: qty 2
  Filled 2020-10-06 (×6): qty 8

## 2020-10-06 MED ORDER — AMLODIPINE BESYLATE 10 MG PO TABS
10.0000 mg | ORAL_TABLET | Freq: Every day | ORAL | Status: DC
  Administered 2020-10-06 – 2020-10-11 (×6): 10 mg via ORAL
  Filled 2020-10-06 (×6): qty 1

## 2020-10-06 MED ORDER — FLUTICASONE PROPIONATE 50 MCG/ACT NA SUSP
2.0000 | Freq: Every day | NASAL | Status: DC
  Administered 2020-10-06 – 2020-10-11 (×6): 2 via NASAL
  Filled 2020-10-06: qty 16

## 2020-10-06 NOTE — Progress Notes (Signed)
[facility] [place]  Subjective  -breathing is better today.  Out of bed sitting in a chair.   Objective (!) 154/69 71 98.1 F (36.7 C) (Oral) 18 97%  Intake/Output Summary (Last 24 hours) at 10/06/2020 0908 Last data filed at 10/06/2020 0746 Gross per 24 hour  Intake 2258.88 ml  Output 1445 ml  Net 813.88 ml    Abdominal and groin incisions clean dry and intact. Right DP palpable Left DP PT brisk by Doppler Abdomen is soft with appropriate postop incisional tenderness  Laboratory Lab Results: Recent Labs    10/05/20 0154 10/06/20 0048  WBC 12.4* 12.4*  HGB 10.6* 10.0*  HCT 32.1* 30.0*  PLT 75* 82*   BMET Recent Labs    10/05/20 0154 10/06/20 0048  NA 137 141  K 4.8 4.6  CL 108 111  CO2 21* 19*  GLUCOSE 116* 109*  BUN 29* 24*  CREATININE 1.29* 0.92  CALCIUM 7.6* 8.0*    COAG Lab Results  Component Value Date   INR 1.3 (H) 10/03/2020   INR 1.0 09/26/2020   INR 1.5 (H) 11/24/2019   No results found for: PTT  Assessment/Planning:  69 year old male postop day 3 status post aortobifemoral bypass.  His breathing appears better today although has a little bit of mild wheezing still.  We restarted all of his home inhalers yesterday and got respiratory to see him.  He tolerated the NG tube being removed and had another small bowel movement.  We will advance to clear liquid diet today.  DC triple-lumen in left IJ.  DC Foley catheter.  Creatinine continues to improve and 0.92 today and had urine output of 1125 mL in the last 24 hours.  Discussed that he continue working with his incentive spirometer.  Marty Heck 10/06/2020 9:08 AM --

## 2020-10-06 NOTE — Progress Notes (Signed)
Mobility Specialist - Progress Note   10/06/20 1134  Mobility  Activity Ambulated in room  Level of Assistance Standby assist, set-up cues, supervision of patient - no hands on  Assistive Device Front wheel walker  Distance Ambulated (ft) 40 ft  Mobility Response Tolerated poorly  Mobility performed by Mobility specialist  $Mobility charge 1 Mobility    Pre-mobility: 85 HR, 97% SpO2 Post-mobility: 87 HR, 95% SpO2  Pt ambulated on RA. Limited mobility due to BLE burning pain that the pt rated a 10/10. RN notified.  Pricilla Handler Mobility Specialist Mobility Specialist Phone: [phone]

## 2020-10-07 LAB — GLUCOSE, CAPILLARY
Glucose-Capillary: 110 mg/dL — ABNORMAL HIGH (ref 70–99)
Glucose-Capillary: 107 mg/dL — ABNORMAL HIGH (ref 70–99)
Glucose-Capillary: 95 mg/dL (ref 70–99)
Glucose-Capillary: 109 mg/dL — ABNORMAL HIGH (ref 70–99)

## 2020-10-07 MED ORDER — TRAMADOL HCL 50 MG PO TABS
50.0000 mg | ORAL_TABLET | Freq: Two times a day (BID) | ORAL | Status: DC | PRN
  Administered 2020-10-07 – 2020-10-11 (×3): 50 mg via ORAL
  Filled 2020-10-07 (×6): qty 1

## 2020-10-07 NOTE — Progress Notes (Signed)
Mobility Specialist - Progress Note   10/07/20 1257  Mobility  Activity Ambulated in hall  Level of Assistance Contact guard assist, steadying assist  Assistive Device Front wheel walker  Distance Ambulated (ft) 60 ft  Mobility Response Tolerated poorly  Mobility performed by Mobility specialist  $Mobility charge 1 Mobility    Pre-mobility: 81 HR, 90% SpO2 During mobility: 91 HR, 93% SpO2 Post-mobility: 74 HR, 93% SpO2  Pt c/o the same burning pain, mainly around his knees while ambulating. He rated it an 8/10. Pt had some wheezing, he says he has not used his IS today. RN notified. Pt assisted back in bed after walk and sat up in order to use his IS.   Pricilla Handler Mobility Specialist Mobility Specialist Phone: [phone]

## 2020-10-07 NOTE — Progress Notes (Addendum)
Progress Note    10/07/2020 8:48 AM 4 Days Post-Op  Subjective:  Tolerated clear liquid diet without nausea/vomiting.  Had a little heartburn.  We had an extensive conversation about pain medication and his hx of alcoholism.  He does not to get addicted to the pain medication but does want to take it if it helps him ambulate and get around better.   Still has burning in his thighs.    Afebrile HR 60's-70's NSR 222'L-798'X systolic 21% RA  Vitals:   10/07/20 0355 10/07/20 0431  BP: 140/70   Pulse: 89 67  Resp: 17 19  Temp: 98.1 F (36.7 C) 98.1 F (36.7 C)  SpO2: 96%     Physical Exam: Cardiac:  regular Lungs:  Non labored Incisions:  All incisions healing nicely Extremities:  Palpable right DP and +doppler signals left DP/PT Abdomen:  Soft; +flatus   CBC    Component Value Date/Time   WBC 12.4 (H) 10/06/2020 0048   RBC 3.21 (L) 10/06/2020 0048   HGB 10.0 (L) 10/06/2020 0048   HGB 16.1 11/02/2019 1000   HCT 30.0 (L) 10/06/2020 0048   HCT 47.2 11/02/2019 1000   PLT 82 (L) 10/06/2020 0048   PLT 177 11/02/2019 1000   MCV 93.5 10/06/2020 0048   MCV 90 11/02/2019 1000   MCH 31.2 10/06/2020 0048   MCHC 33.3 10/06/2020 0048   RDW 14.6 10/06/2020 0048   RDW 13.6 11/02/2019 1000   LYMPHSABS 1.7 11/28/2019 0404   LYMPHSABS 2.5 11/02/2019 1000   MONOABS 1.1 (H) 11/28/2019 0404   EOSABS 0.1 11/28/2019 0404   EOSABS 0.2 11/02/2019 1000   BASOSABS 0.0 11/28/2019 0404   BASOSABS 0.1 11/02/2019 1000    BMET    Component Value Date/Time   NA 141 10/06/2020 0048   NA 139 04/16/2020 1046   K 4.6 10/06/2020 0048   CL 111 10/06/2020 0048   CO2 19 (L) 10/06/2020 0048   GLUCOSE 109 (H) 10/06/2020 0048   BUN 24 (H) 10/06/2020 0048   BUN 14 04/16/2020 1046   CREATININE 0.92 10/06/2020 0048   CREATININE 0.77 06/29/2017 1534   CALCIUM 8.0 (L) 10/06/2020 0048   GFRNONAA >60 10/06/2020 0048   GFRAA 98 04/16/2020 1046    INR    Component Value Date/Time   INR 1.3  (H) 10/03/2020 1621     Intake/Output Summary (Last 24 hours) at 10/07/2020 0848 Last data filed at 10/07/2020 0435 Gross per 24 hour  Intake 448 ml  Output 380 ml  Net 68 ml     Assessment:  69 y.o. male is s/p:  ABF bypass  4 Days Post-Op  Plan: -pt with palpable right DP pulse and + doppler signals left foot -tolerated clear liquids-will advance diet for lunch. -pt now on room air.  Continue to use IS and oob and mobilizing more.  -had very long discussion about pain medication.  We are going to order Tramadol bid prn and use Tylenol prn to supplement.  He had Tramadol after his open heart surgery.   He will take the Tramadol about 30 minutes before they walk with him to see if it helps him.  When I see him tomorrow, we will talk about it again and talk about what to send him home with as far as pain medication goes.  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   Discussed plan with RN. -we also had a long discussion about smoking  cessation and why it is important especially given his CAD/CABG hx and now ABF for occlusive disease.   -DVT prophylaxis:  Sq heparin -will order gentle diuresis - lasix 20mg  IV x one -will order CBC/CMP for tomorrow   Gregory Locket, PA-C [facility] [phone] 10/07/2020 8:48 AM  I have seen and evaluated the patient. I agree with the PA note as documented above.  69 year old male status post aortobifem.  Incisions look good.  He has a palpable right DP and good Doppler signals in the left foot.  Tolerated clear liquids yesterday.  Has had 2 small bowel movements and passing gas.  Will advance to regular diet.  Renal function has improved over the last several creatinine checks.  His breathing is much better he is on all of his home asthma medicines.  Walked yesterday.  Sitting in chair this morning.  Marty Heck, MD [facility] [place] Office:  [phone]

## 2020-10-08 ENCOUNTER — Inpatient Hospital Stay (HOSPITAL_COMMUNITY): Payer: PPO

## 2020-10-08 LAB — GLUCOSE, CAPILLARY
Glucose-Capillary: 107 mg/dL — ABNORMAL HIGH (ref 70–99)
Glucose-Capillary: 108 mg/dL — ABNORMAL HIGH (ref 70–99)
Glucose-Capillary: 98 mg/dL (ref 70–99)

## 2020-10-08 MED ORDER — FAMOTIDINE 20 MG PO TABS
20.0000 mg | ORAL_TABLET | Freq: Two times a day (BID) | ORAL | Status: DC | PRN
  Administered 2020-10-08: 20 mg via ORAL
  Filled 2020-10-08: qty 1

## 2020-10-08 MED ORDER — PANTOPRAZOLE SODIUM 40 MG PO TBEC
40.0000 mg | DELAYED_RELEASE_TABLET | Freq: Every day | ORAL | Status: DC
  Administered 2020-10-08 – 2020-10-10 (×3): 40 mg via ORAL
  Filled 2020-10-08 (×2): qty 1

## 2020-10-08 MED ORDER — DOCUSATE SODIUM 100 MG PO CAPS
100.0000 mg | ORAL_CAPSULE | Freq: Two times a day (BID) | ORAL | Status: DC | PRN
  Administered 2020-10-08 – 2020-10-09 (×2): 100 mg via ORAL
  Filled 2020-10-08 (×2): qty 1

## 2020-10-08 MED ORDER — MAGNESIUM CITRATE PO SOLN
300.0000 mL | Freq: Once | ORAL | Status: AC
  Administered 2020-10-08: 300 mL via ORAL
  Filled 2020-10-08: qty 592

## 2020-10-08 NOTE — TOC Initial Note (Signed)
Transition of Care (TOC) - Initial/Assessment Note  Marvetta Gibbons RN, BSN Transitions of Care Unit 4E- RN Case Manager See Treatment Team for direct phone #    Patient Details  Name: Gregory [name] MRN: [id number] Date of Birth: 14-Sep-1951  Transition of Care [facility]) CM/SW Contact:    Dawayne Patricia, RN Phone Number: 10/08/2020, 2:36 PM  Clinical Narrative:                 Pt with order for HHPT- CM in to speak with pt and wife at bedside, per conversation pt plans to return home with wife who will be there to assist post discharge. They believe pt has RW at home, wife to check on this. Discussed shower chair- which they will purchase on own as Medicare will not cover. (they are not interested in a 3n1) List provided to pt/wife for [facility] choice Per CMS guidelines from medicare.gov website with star ratings (copy placed in shadow chart)- they would prefer to use agency that vascular works with - which is [facility]- per Tiffany with [facility] they are checking to see if they can accept pt's insurance. Will await return call on insurance status- pt and wife voice understanding- and will look at alternative options from list in case one is needed.   [facility] for [facility]- referral pending   Expected Discharge Plan: [place] Barriers to Discharge: Continued Medical Work up   Patient Goals and CMS Choice Patient states their goals for this hospitalization and ongoing recovery are:: return home and get stronger CMS Medicare.gov Compare Post Acute Care list provided to:: Patient Choice offered to / list presented to : Patient, Spouse  Expected Discharge Plan and Services Expected Discharge Plan: [place]   Discharge Planning Services: CM Consult Post Acute Care Choice: [place] arrangements for the past 2 months: Single Family Home                           HH Arranged: PT          Prior Living Arrangements/Services Living  arrangements for the past 2 months: Single Family Home Lives with:: Spouse Patient language and need for interpreter reviewed:: Yes        Need for Family Participation in Patient Care: Yes (Comment) Care giver support system in place?: Yes (comment) Current home services: DME Criminal Activity/Legal Involvement Pertinent to Current Situation/Hospitalization: No - Comment as needed  Activities of Daily Living      Permission Sought/Granted Permission sought to share information with : Chartered certified accountant granted to share information with : Yes, Verbal Permission Granted     Permission granted to share info w AGENCY: HH agencies        Emotional Assessment Appearance:: Appears stated age Attitude/Demeanor/Rapport: Engaged, Gracious Affect (typically observed): Appropriate, Pleasant Orientation: : Oriented to Self, Oriented to Place, Oriented to  Time, Oriented to Situation Alcohol / Substance Use: Not Applicable Psych Involvement: No (comment)  Admission diagnosis:  Peripheral arterial occlusive disease ([place]) [I77.9] Patient Active Problem List   Diagnosis Date Noted  . Peripheral arterial occlusive disease ([place]) 10/03/2020  . S/P CABG x 1 11/24/2019  . S/P aortic valve replacement with bioprosthetic valve 11/24/2019  . Aortic stenosis, severe 11/24/2019  . PAD (peripheral artery disease) ([place])   . Mild persistent asthma 10/29/2019  . Herpes zoster without complication 93/79/0240  . RBBB (right bundle branch block with left  anterior fascicular block)   . Peripheral neuropathy   . S/P lumbar spinal fusion 10/25/2014  . Recovering alcoholic in remission ([place]) 08/10/2014  . Substance induced mood disorder ([place]) 06/30/2014  . Substance-induced sleep disorder ([place]) 06/30/2014  . Obsessive compulsive disorder   . OSA (obstructive sleep apnea) 01/20/2014  . CAD (coronary artery disease)   . Severe aortic stenosis   . Mentally disabled 11/25/2012  .  Hyperlipidemia with target LDL less than 130 11/25/2012  . Bipolar disorder ([place])   . Hypertension   . Allergic rhinitis due to pollen 08/11/2011  . Tobacco abuse    PCP:  [name], MD Pharmacy:   [facility] [place], [place] [address] [place] [address] Phone: [phone] Fax: [phone]  Ammie Ferrier [address], [place] [facility] [address] [place] [address] Phone: [phone] Fax: [phone]     Social Determinants of Health ([place]) Interventions    Readmission Risk Interventions No flowsheet data found.

## 2020-10-08 NOTE — Progress Notes (Signed)
Physical Therapy Treatment Patient Details Name: Gregory [name] MRN: [id number] DOB: 1951-07-04 Today's Date: 10/08/2020    History of Present Illness 69 yo admitted with bil LE claudication s/p aortobifem BPG and aortic endarterectomy. PMhx: PAD, CAD, CABG/AVR, bipolar, HLD, HTN    PT Comments    Pt with nausea and vomiting this afternoon, afraid of worsening with mobility. Pt agreeable to sitting EoB for seated exercise. Hip, knee and ankle exercise within tolerance from LE pain due to surgery. Pt will try to follow back tomorrow to progress mobility as able. D/c plans remain appropriate. PT will continue to follow acutely.    Follow Up Recommendations  Home health PT     Equipment Recommendations  None recommended by PT       Precautions / Restrictions Precautions Precautions: Fall Restrictions Weight Bearing Restrictions: No    Mobility  Bed Mobility Overal bed mobility: Needs Assistance Bed Mobility: Supine to Sit;Sit to Supine   Sidelying to sit: Min guard;HOB elevated   Sit to supine: Min guard;HOB elevated                Cognition Arousal/Alertness: Awake/alert Behavior During Therapy: WFL for tasks assessed/performed Overall Cognitive Status: Within Functional Limits for tasks assessed                                 General Comments: distracted at times by pain      Exercises General Exercises - Lower Extremity Quad Sets: AROM;Both;10 reps Gluteal Sets: AROM;Both;10 reps Long Arc Quad: AROM;Seated;Both Hip ABduction/ADduction: AROM;Both;10 reps Hip Flexion/Marching: AROM;Both;10 reps Toe Raises: AROM;Both;10 reps;Seated Heel Raises: AROM;Both;10 reps;Seated    General Comments  VSS on RA, wife present in room       Pertinent Vitals/Pain Pain Assessment: Faces Faces Pain Scale: Hurts whole lot Pain Location: abdomen and bil LE with movement Pain Descriptors / Indicators: Aching;Burning Pain Intervention(s): Limited  activity within patient's tolerance;Monitored during session;Repositioned           PT Goals (current goals can now be found in the care plan section) Acute Rehab PT Goals PT Goal Formulation: With patient/family Time For Goal Achievement: 10/18/20 Potential to Achieve Goals: Good Progress towards PT goals: Not progressing toward goals - comment (nausea/vomiting and abdominal pain )    Frequency    Min 3X/week      PT Plan Current plan remains appropriate       AM-PAC PT "6 Clicks" Mobility   Outcome Measure  Help needed turning from your back to your side while in a flat bed without using bedrails?: A Little Help needed moving from lying on your back to sitting on the side of a flat bed without using bedrails?: A Little Help needed moving to and from a bed to a chair (including a wheelchair)?: A Little Help needed standing up from a chair using your arms (e.g., wheelchair or bedside chair)?: A Little Help needed to walk in hospital room?: A Lot Help needed climbing 3-5 steps with a railing? : Total 6 Click Score: 15    End of Session   Activity Tolerance: Patient limited by pain Patient left: in bed;with call bell/phone within reach;with family/visitor present Nurse Communication: Mobility status PT Visit Diagnosis: Other abnormalities of gait and mobility (R26.89);Difficulty in walking, not elsewhere classified (R26.2);Muscle weakness (generalized) (M62.81);Pain Pain - part of body: Leg     Time: 1535-1558 PT Time Calculation (min) (ACUTE ONLY): 23  min  Charges:  $Therapeutic Exercise: 23-37 mins                     Shed Nixon B. Migdalia Dk PT, DPT Acute Rehabilitation Services Pager [phone] Office [phone]    [place] 10/08/2020, 4:12 PM

## 2020-10-08 NOTE — Care Management Important Message (Signed)
Important Message  Patient Details  Name: Gregory [name] MRN: [id number] Date of Birth: [birth year]   Medicare Important Message Given:  Yes     Shelda Altes 10/08/2020, 11:34 AM

## 2020-10-08 NOTE — Progress Notes (Addendum)
Progress Note    10/08/2020 7:26 AM 5 Days Post-Op  Subjective:  Says he's not sure the pain medicine worked.  Probably going stick with Tylenol.  Says he has passed a little bit of gas but no bowel movement.   Afebrile HR 60's-70's NSR 161'W-960'A systolic 54% RA  Vitals:   10/07/20 2313 10/08/20 0304  BP: 139/72 (!) 155/82  Pulse: 67 71  Resp: 13 20  Temp: 98.8 F (37.1 C) 98.1 F (36.7 C)  SpO2: 92% 90%    Physical Exam: Cardiac:  regular Lungs:  Non labored Incisions:  All incisions clean and dry Extremities:  Palpable right DP and left foot is warm and well perfused.   Abdomen:  Distended, somewhat tight.  +flatus; -BM  CBC    Component Value Date/Time   WBC 12.4 (H) 10/06/2020 0048   RBC 3.21 (L) 10/06/2020 0048   HGB 10.0 (L) 10/06/2020 0048   HGB 16.1 11/02/2019 1000   HCT 30.0 (L) 10/06/2020 0048   HCT 47.2 11/02/2019 1000   PLT 82 (L) 10/06/2020 0048   PLT 177 11/02/2019 1000   MCV 93.5 10/06/2020 0048   MCV 90 11/02/2019 1000   MCH 31.2 10/06/2020 0048   MCHC 33.3 10/06/2020 0048   RDW 14.6 10/06/2020 0048   RDW 13.6 11/02/2019 1000   LYMPHSABS 1.7 11/28/2019 0404   LYMPHSABS 2.5 11/02/2019 1000   MONOABS 1.1 (H) 11/28/2019 0404   EOSABS 0.1 11/28/2019 0404   EOSABS 0.2 11/02/2019 1000   BASOSABS 0.0 11/28/2019 0404   BASOSABS 0.1 11/02/2019 1000    BMET    Component Value Date/Time   NA 141 10/06/2020 0048   NA 139 04/16/2020 1046   K 4.6 10/06/2020 0048   CL 111 10/06/2020 0048   CO2 19 (L) 10/06/2020 0048   GLUCOSE 109 (H) 10/06/2020 0048   BUN 24 (H) 10/06/2020 0048   BUN 14 04/16/2020 1046   CREATININE 0.92 10/06/2020 0048   CREATININE 0.77 06/29/2017 1534   CALCIUM 8.0 (L) 10/06/2020 0048   GFRNONAA >60 10/06/2020 0048   GFRAA 98 04/16/2020 1046    INR    Component Value Date/Time   INR 1.3 (H) 10/03/2020 1621     Intake/Output Summary (Last 24 hours) at 10/08/2020 0726 Last data filed at 10/08/2020 0300 Gross  per 24 hour  Intake 934 ml  Output 900 ml  Net 34 ml     Assessment:  69 y.o. male is s/p:  ABF bypass grafting  5 Days Post-Op  Plan: -pt has not had another BM and abdomen is distended this am.  Will give mag citrate starting with half bottle and if no results, will give the other half.  Continue to mobilize.  He did walk to the nursing station yesterday. -pt took one tramadol yesterday and he is unsure if it helped him.  He most likely will stick with Tylenol.     -breathing continues to improve -check labs in am Case management consulted for [facility] needs -DVT prophylaxis:  Sq heparin   Leontine Locket, PA-C [facility] [phone] 10/08/2020 7:26 AM  I have seen and evaluated the patient. I agree with the PA note as documented above.  Status post aortobifemoral bypass.  Overall continues to look pretty good.  Incisions are clean dry and intact.  He has good right pedal pulse in the right foot and  in the left Doppler signal.  Issue has been some mild abdominal distention.  Will work with  enema today.  Possible discharge tomorrow.  Marty Heck, MD [facility] [place] Office: [phone]

## 2020-10-08 NOTE — Progress Notes (Signed)
Xray images reviewed, Multiple dilated air filled loops of small bowel. Some air in rectum.  Most likely ileus will place NG Tube  Ruta Hinds, MD [facility] [place] Office: [phone]

## 2020-10-08 NOTE — Progress Notes (Signed)
Mobility Specialist - Progress Note   10/08/20 1509  Mobility  Activity Contraindicated/medical hold    RN states he just vomited and has been vomiting today.   Pricilla Handler Mobility Specialist Mobility Specialist Phone: [phone]

## 2020-10-08 NOTE — Progress Notes (Signed)
Pt with 2 episodes of vomiting will make NPO and get abdominal xray  Gregory Hinds, MD [facility] [place] Office: [phone]

## 2020-10-09 LAB — BASIC METABOLIC PANEL
Anion gap: 10 (ref 5–15)
BUN: 24 mg/dL — ABNORMAL HIGH (ref 8–23)
CO2: 24 mmol/L (ref 22–32)
Calcium: 8.1 mg/dL — ABNORMAL LOW (ref 8.9–10.3)
Chloride: 103 mmol/L (ref 98–111)
Creatinine, Ser: 0.92 mg/dL (ref 0.61–1.24)
GFR, Estimated: >60 mL/min (ref >60)
Glucose, Bld: 91 mg/dL (ref 70–99)
Potassium: 4.4 mmol/L (ref 3.5–5.1)
Sodium: 137 mmol/L (ref 135–145)

## 2020-10-09 LAB — CBC
HCT: 26.2 % — ABNORMAL LOW (ref 39.0–52.0)
Hemoglobin: 8.7 g/dL — ABNORMAL LOW (ref 13.0–17.0)
MCH: 30.6 pg (ref 26.0–34.0)
MCHC: 33.2 g/dL (ref 30.0–36.0)
MCV: 92.3 fL (ref 80.0–100.0)
Platelets: 159 10*3/uL (ref 150–400)
RBC: 2.84 MIL/uL — ABNORMAL LOW (ref 4.22–5.81)
RDW: 14.2 % (ref 11.5–15.5)
WBC: 7.1 10*3/uL (ref 4.0–10.5)
nRBC: 0 % (ref 0.0–0.2)

## 2020-10-09 LAB — GLUCOSE, CAPILLARY
Glucose-Capillary: 86 mg/dL (ref 70–99)
Glucose-Capillary: 102 mg/dL — ABNORMAL HIGH (ref 70–99)
Glucose-Capillary: 86 mg/dL (ref 70–99)
Glucose-Capillary: 83 mg/dL (ref 70–99)

## 2020-10-09 MED ORDER — ACETAMINOPHEN 10 MG/ML IV SOLN
1000.0000 mg | Freq: Four times a day (QID) | INTRAVENOUS | Status: AC
  Administered 2020-10-09 – 2020-10-10 (×4): 1000 mg via INTRAVENOUS
  Filled 2020-10-09 (×4): qty 100

## 2020-10-09 NOTE — Progress Notes (Signed)
Physical Therapy Treatment Patient Details Name: Gregory [name] MRN: [id number] DOB: 1951/02/25 Today's Date: 10/09/2020    History of Present Illness 69 yo admitted with bil LE claudication s/p aortobifem BPG and aortic endarterectomy. PMhx: PAD, CAD, CABG/AVR, bipolar, HLD, HTN    PT Comments    Pt received pain medication prior to session to be able to progress ambulation. On entry pt noted that his NG tube had been pulled out of his nose slightly and requested RN reinsert prior to ambulation. Pain medication allows pt to increase ambulation distance. Pt was able to ambulate 2 bouts of 90 feet with RW and min guard assist. Pt also provided with HEP of exercises that were performed during yesterdays session. See information below. Pt also provided with verbal reminder and pt and wife stated they would work on them. D/c plan remains appropriate. PT will continue to follow acutely.   Access Code: [email or web address] URL: [email or web address] Date: 10/09/2020 Prepared by: Benjamine Mola  Exercises Beginner Bridge - 2 x daily - 7 x weekly Supine Quadricep Sets - 2 x daily - 7 x weekly Seated March - 2 x daily - 7 x weekly Seated Long Arc Quad - 2 x daily - 7 x weekly Seated Hip Abduction - 2 x daily - 7 x weekly Seated Heel Toe Raises - 2 x daily - 7 x weekly    Follow Up Recommendations  Home health PT     Equipment Recommendations  None recommended by PT       Precautions / Restrictions Precautions Precautions: Fall Restrictions Weight Bearing Restrictions: No    Mobility  Bed Mobility Overal bed mobility: Needs Assistance Bed Mobility: Supine to Sit;Sit to Supine     Supine to sit: Min guard;HOB elevated Sit to supine: Min guard;HOB elevated   General bed mobility comments: min guard for safety, vc for hand placement  Transfers Overall transfer level: Needs assistance Equipment used: Rolling walker (2 wheeled) Transfers: Sit to/from [employer] Sit to Stand: Min guard Stand pivot transfers: Min guard       General transfer comment: min guard for safety, vc for hand placement for power up, able to self steady before reaching for RW  Ambulation/Gait Ambulation/Gait assistance: Min guard Gait Distance (Feet): 90 Feet (x2) Assistive device: Rolling walker (2 wheeled) Gait Pattern/deviations: Step-through pattern;Decreased step length - right;Decreased step length - left;Shuffle;Trunk flexed Gait velocity: slowed   General Gait Details: min guard for safety, slow, shuffling gait, vc for upright posture and proximity to RW, pt only with 2 audible screams today due to pain          Balance Overall balance assessment: Needs assistance   Sitting balance-Leahy Scale: Fair       Standing balance-Leahy Scale: Fair                              Cognition Arousal/Alertness: Awake/alert Behavior During Therapy: WFL for tasks assessed/performed Overall Cognitive Status: Within Functional Limits for tasks assessed                                           General Comments General comments (skin integrity, edema, etc.): pt wife in room, SaO2 on RA with ambulation >90%O2, max noted HR 98bpm      Pertinent Vitals/Pain Pain Assessment: Faces Pain Score: 5  Faces  Pain Scale: Hurts little more Pain Location: abdomen and bil LE with movement Pain Descriptors / Indicators: Aching;Burning Pain Intervention(s): Limited activity within patient's tolerance;Monitored during session;Premedicated before session           PT Goals (current goals can now be found in the care plan section) Acute Rehab PT Goals Patient Stated Goal: be able to keep up with my grandkids PT Goal Formulation: With patient/family Time For Goal Achievement: 10/18/20 Potential to Achieve Goals: Good Progress towards PT goals: Progressing toward goals    Frequency    Min 3X/week      PT Plan Current plan  remains appropriate       AM-PAC PT "6 Clicks" Mobility   Outcome Measure  Help needed turning from your back to your side while in a flat bed without using bedrails?: A Little Help needed moving from lying on your back to sitting on the side of a flat bed without using bedrails?: A Little Help needed moving to and from a bed to a chair (including a wheelchair)?: A Little Help needed standing up from a chair using your arms (e.g., wheelchair or bedside chair)?: A Little Help needed to walk in hospital room?: A Lot Help needed climbing 3-5 steps with a railing? : Total 6 Click Score: 15    End of Session Equipment Utilized During Treatment: Gait belt Activity Tolerance: Patient limited by pain Patient left: in bed;with call bell/phone within reach;with family/visitor present Nurse Communication: Mobility status PT Visit Diagnosis: Other abnormalities of gait and mobility (R26.89);Difficulty in walking, not elsewhere classified (R26.2);Muscle weakness (generalized) (M62.81);Pain Pain - part of body: Leg     Time: 1537-1610 PT Time Calculation (min) (ACUTE ONLY): 33 min  Charges:  $Gait Training: 8-22 mins $Therapeutic Exercise: 8-22 mins                     Tyresse Jayson B. Migdalia Dk PT, DPT Acute Rehabilitation Services Pager [phone] Office [phone]    [place] 10/09/2020, 4:31 PM

## 2020-10-09 NOTE — Progress Notes (Addendum)
Progress Note    10/09/2020 7:53 AM 6 Days Post-Op  Subjective:  NG placed last night due to likely ileus.  Pt states he had 3 large bowel movements since then.  No nausea.  Abd feeling better.   Vitals:   10/09/20 0019 10/09/20 0451  BP: 124/65 (!) 120/58  Pulse: 62 60  Resp: 20 17  Temp: 98.7 F (37.1 C) 98.4 F (36.9 C)  SpO2: 97% 97%   Physical Exam: Lungs:  Non labored Incisions:  abd and groin incisions c/d/i Extremities:  Palpable PT pulse RLE; L foot warm and well perfused Abdomen:  Some distention but soft Neurologic: A&O  CBC    Component Value Date/Time   WBC 7.1 10/09/2020 0324   RBC 2.84 (L) 10/09/2020 0324   HGB 8.7 (L) 10/09/2020 0324   HGB 16.1 11/02/2019 1000   HCT 26.2 (L) 10/09/2020 0324   HCT 47.2 11/02/2019 1000   PLT 159 10/09/2020 0324   PLT 177 11/02/2019 1000   MCV 92.3 10/09/2020 0324   MCV 90 11/02/2019 1000   MCH 30.6 10/09/2020 0324   MCHC 33.2 10/09/2020 0324   RDW 14.2 10/09/2020 0324   RDW 13.6 11/02/2019 1000   LYMPHSABS 1.7 11/28/2019 0404   LYMPHSABS 2.5 11/02/2019 1000   MONOABS 1.1 (H) 11/28/2019 0404   EOSABS 0.1 11/28/2019 0404   EOSABS 0.2 11/02/2019 1000   BASOSABS 0.0 11/28/2019 0404   BASOSABS 0.1 11/02/2019 1000    BMET    Component Value Date/Time   NA 137 10/09/2020 0324   NA 139 04/16/2020 1046   K 4.4 10/09/2020 0324   CL 103 10/09/2020 0324   CO2 24 10/09/2020 0324   GLUCOSE 91 10/09/2020 0324   BUN 24 (H) 10/09/2020 0324   BUN 14 04/16/2020 1046   CREATININE 0.92 10/09/2020 0324   CREATININE 0.77 06/29/2017 1534   CALCIUM 8.1 (L) 10/09/2020 0324   GFRNONAA >60 10/09/2020 0324   GFRAA 98 04/16/2020 1046    INR    Component Value Date/Time   INR 1.3 (H) 10/03/2020 1621     Intake/Output Summary (Last 24 hours) at 10/09/2020 0753 Last data filed at 10/08/2020 1738 Gross per 24 hour  Intake --  Output 175 ml  Net -175 ml     Assessment/Plan:  69 y.o. male is s/p ABF with post op  ileus 6 Days Post-Op   BLE well perfused GI: likely ileus; bowel function and flatus since NG tube placement; clamp NG; ok to have sips and chips with meds Pain: IV tylenol and crushed p.o. tylenol ok; avoid narcotics Encouraged participation with PT/OT    Dagoberto Ligas, PA-C [facility] [phone] 10/09/2020 7:53 AM   I have seen and evaluated the patient. I agree with the PA note as documented above.  69 year old male status post aortobifem.  He was making good progress until yesterday when he had vomiting x2.  Ultimately suspect he had an ileus as he had some abdominal distention yesterday morning.  NG tube was placed subsequently had 3 bowel movements last night.  Abdomen is much softer today.  We will clamp NG tube today.  Discussed with nursing that if he does okay throughout the day they can remove the NG tube this evening.  Okay with sips and chips for meds.  Groins look good.  Right DP palpable left foot good Doppler signals.  Cr 0.92.  Hgb 8.7.  Marty Heck, MD [facility] [place] Office: [phone]

## 2020-10-09 NOTE — Progress Notes (Signed)
Occupational Therapy Treatment Patient Details Name: Gregory [name] MRN: [id number] DOB: 1950-12-29 Today's Date: 10/09/2020    History of present illness 69 yo admitted with bil LE claudication s/p aortobifem BPG and aortic endarterectomy. PMhx: PAD, CAD, CABG/AVR, bipolar, HLD, HTN   OT comments  OT treatment session with focus on bed mobility, short-distance ambulation with RW, pain management, energy conservation techniques, and ADL re-education. Patient demonstrates all bed mobility with supervision and all functional transfers with Min guard this date. Patient also completed 3 grooming tasks standing at sink level without need for rest break. Patient reporting 3/10 "burning" pain in BLE and aching pain in abdomen at rest increasing to 5-6/10 with movement. Patient reports use of incentive spirometer every 2 hours x10 reps. Patient would benefit from continued acute OT services to maximize independence with self-care tasks, functional transfers, and mobility in prep for safe d/c home with wife who is able to provide supervision/assist as needed.    Follow Up Recommendations  No OT follow up    Equipment Recommendations  None recommended by OT    Recommendations for Other Services      Precautions / Restrictions Precautions Precautions: Fall Restrictions Weight Bearing Restrictions: No       Mobility Bed Mobility Overal bed mobility: Needs Assistance Bed Mobility: Supine to Sit;Sit to Supine           General bed mobility comments: Supine <> EOB with supervision A and increased time 2/2 pain.   Transfers Overall transfer level: Needs assistance Equipment used: Rolling walker (2 wheeled) Transfers: Sit to/from [facility] Sit to Stand: Min guard Stand pivot transfers: Min guard       General transfer comment: Min guard with RW for all functional transfers this date.     Balance     Sitting balance-Leahy Scale: Fair       Standing  balance-Leahy Scale: Poor                             ADL either performed or assessed with clinical judgement   ADL Overall ADL's : Needs assistance/impaired     Grooming: Wash/dry hands;Wash/dry face;Oral care;Standing;Min guard Grooming Details (indicate cue type and reason): Patient completed 3/3 grooming tasks standing at sink level with Min guard and unilateral UE support on sink surface 2/2 pain. Cues for safety with RW.                  Toilet Transfer: Minimal Designer, jewellery Details (indicate cue type and reason): Simulated with stand-pivot transfer to EOB.          Functional mobility during ADLs: Min guard;Rolling walker General ADL Comments: Min guard progressing to supervision A for short distance ambulation with RW.      Vision       Perception     Praxis      Cognition Arousal/Alertness: Awake/alert Behavior During Therapy: WFL for tasks assessed/performed Overall Cognitive Status: Within Functional Limits for tasks assessed                                          Exercises     Shoulder Instructions       General Comments      Pertinent Vitals/ Pain       Pain Assessment: 0-10 Pain Score: 5  Pain Location:  abdomen and bil LE with movement Pain Descriptors / Indicators: Aching;Burning Pain Intervention(s): Monitored during session;Repositioned;Relaxation;Limited activity within patient's tolerance  Home Living                                          Prior Functioning/Environment              Frequency  Min 2X/week        Progress Toward Goals  OT Goals(current goals can now be found in the care plan section)  Progress towards OT goals: Progressing toward goals  Acute Rehab OT Goals Patient Stated Goal: be able to keep up with my grandkids OT Goal Formulation: With patient Time For Goal Achievement: 10/18/20 Potential to Achieve Goals: Good ADL  Goals Pt Will Perform Grooming: with supervision;standing Pt Will Perform Lower Body Bathing: with supervision;sit to/from stand Pt Will Perform Lower Body Dressing: with supervision;sit to/from stand Pt Will Transfer to Toilet: with supervision;ambulating Pt Will Perform Toileting - Clothing Manipulation and hygiene: with supervision;sit to/from stand Pt Will Perform Tub/Shower Transfer: Tub transfer;ambulating;rolling walker Additional ADL Goal #1: Pt will perform bed mobility modified independently.  Plan Discharge plan remains appropriate    Co-evaluation                 AM-PAC OT "6 Clicks" Daily Activity     Outcome Measure   Help from another person eating meals?: None Help from another person taking care of personal grooming?: A Little Help from another person toileting, which includes using toliet, bedpan, or urinal?: A Lot Help from another person bathing (including washing, rinsing, drying)?: A Lot Help from another person to put on and taking off regular upper body clothing?: A Little Help from another person to put on and taking off regular lower body clothing?: A Lot 6 Click Score: 16    End of Session Equipment Utilized During Treatment: Gait belt;Rolling walker  OT Visit Diagnosis: Unsteadiness on feet (R26.81);Other abnormalities of gait and mobility (R26.89);Pain Pain - Right/Left:  (Bilateral knees) Pain - part of body: Knee;Leg   Activity Tolerance Patient limited by pain   Patient Left in bed;with call bell/phone within reach;with bed alarm set   Nurse Communication          Time: 1340-1404 OT Time Calculation (min): 24 min  Charges: OT General Charges $OT Visit: 1 Visit OT Treatments $Self Care/Home Management : 23-37 mins  Rockney Grenz H. OTR/L Supplemental OT, Department of rehab services [phone]  Treyon Wymore R H. 10/09/2020, 2:27 PM

## 2020-10-09 NOTE — Progress Notes (Signed)
Patient's NGT clamped for the duration of the day. Patient reported no nausea/vomiting. NGT removed per MD. Will continue to monitor closely.   Gailen Shelter RN

## 2020-10-10 LAB — GLUCOSE, CAPILLARY
Glucose-Capillary: 92 mg/dL (ref 70–99)
Glucose-Capillary: 119 mg/dL — ABNORMAL HIGH (ref 70–99)
Glucose-Capillary: 94 mg/dL (ref 70–99)

## 2020-10-10 NOTE — Progress Notes (Signed)
RN changed gauze dressing on L groin incision site, noting a small, hardened bump distally. On-call MD notified of change. Verbal order to hold afternoon subq heparin received. Will continue to monitor the patient closely.   Gailen Shelter RN

## 2020-10-10 NOTE — Progress Notes (Addendum)
[facility]  Subjective  - Productive cough, hx of asthma.   Objective 122/76 (!) 59 97.8 F (36.6 C) (Oral) 14 100%  Intake/Output Summary (Last 24 hours) at 10/10/2020 0831 Last data filed at 10/10/2020 0542 Gross per 24 hour  Intake 688.29 ml  Output 800 ml  Net -111.71 ml    Positive BS and has passed gas Abdomin soft with incisional healing Right groin soft, leftgroin soft with SS drainage active. Dry 4 x 4 placed over intact incision Palpable pedal pulses B LE   Assessment/Planning: POD # 7  69 y.o. male is s/p ABF with post op ileus  Ileus resolved will advance diet as tolerates Encourage ambulation Dry dressing to left groin PRN Afebrile WBC WNL   Roxy Horseman 10/10/2020 8:31 AM --  Laboratory Lab Results: Recent Labs    10/09/20 0324  WBC 7.1  HGB 8.7*  HCT 26.2*  PLT 159   BMET Recent Labs    10/09/20 0324  NA 137  K 4.4  CL 103  CO2 24  GLUCOSE 91  BUN 24*  CREATININE 0.92  CALCIUM 8.1*    COAG Lab Results  Component Value Date   INR 1.3 (H) 10/03/2020   INR 1.0 09/26/2020   INR 1.5 (H) 11/24/2019   No results found for: PTT  I agree with the above.  I have seen and evaluated the patient.  His NG tube was removed last night and he is passing gas.  We will slowly advance his diet.  He is having drainage from the left groin.  He may require an incisional VAC to go home with.  Anticipate discharge possibly tomorrow  [name]

## 2020-10-10 NOTE — Progress Notes (Signed)
Mobility Specialist - Progress Note   10/10/20 1458  Mobility  Activity Contraindicated/medical hold   Per RN, pt is being evaluated for hematoma. Will f/u as able.   Pricilla Handler Mobility Specialist Mobility Specialist Phone: [phone]

## 2020-10-11 LAB — GLUCOSE, CAPILLARY
Glucose-Capillary: 109 mg/dL — ABNORMAL HIGH (ref 70–99)
Glucose-Capillary: 91 mg/dL (ref 70–99)

## 2020-10-11 MED ORDER — TRAMADOL HCL 50 MG PO TABS: 50.0000 mg | ORAL_TABLET | Freq: Two times a day (BID) | ORAL | 0 refills | Status: DC | PRN

## 2020-10-11 NOTE — Progress Notes (Signed)
Physical Therapy Treatment Patient Details Name: Gregory [name] MRN: [id number] DOB: 1951/08/13 Today's Date: 10/11/2020    History of Present Illness 69 yo admitted with bil LE claudication s/p aortobifem BPG and aortic endarterectomy. PMhx: PAD, CAD, CABG/AVR, bipolar, HLD, HTN    PT Comments    Pt seated edge of bed on arrival this session.  Pt continues to progress and able to perform stair training to prepare for return home this pm.  Continue to recommend HHPT for d/c home today.      Follow Up Recommendations  Home health PT     Equipment Recommendations  None recommended by PT    Recommendations for Other Services       Precautions / Restrictions Precautions Precautions: Fall Precaution Comments: NGT Restrictions Weight Bearing Restrictions: No    Mobility  Bed Mobility               General bed mobility comments: Pt seated edge of bed on arrival this session.  Transfers Overall transfer level: Needs assistance Equipment used: Rolling walker (2 wheeled) Transfers: Sit to/from [facility] Sit to Stand: Min guard Stand pivot transfers: Min guard       General transfer comment: Cues for hand placement this session.  On first attempt pulling on RW to rise into standing.  Ambulation/Gait Ambulation/Gait assistance: Min guard Gait Distance (Feet): 120 Feet Assistive device: Rolling walker (2 wheeled) Gait Pattern/deviations: Step-through pattern;Decreased step length - right;Decreased step length - left;Shuffle;Trunk flexed Gait velocity: slowed   General Gait Details: Min guard for safety with cues for posture and RW position,  Pt able to advance gt and pain well controlled.   Stairs Stairs: Yes Stairs assistance: Min guard;Min assist Stair Management: One rail Left Number of Stairs: 5 General stair comments: Cues for sequencing and use of hand held assistance on opposing side of rail.  Pt tolerated session  well.   Wheelchair Mobility    Modified Rankin (Stroke Patients Only)       Balance Overall balance assessment: Needs assistance Sitting-balance support: Feet supported Sitting balance-Leahy Scale: Fair       Standing balance-Leahy Scale: Fair                              Cognition Arousal/Alertness: Awake/alert Behavior During Therapy: WFL for tasks assessed/performed Overall Cognitive Status: Within Functional Limits for tasks assessed                                 General Comments: distracted at times by pain but able to redirect      Exercises      General Comments        Pertinent Vitals/Pain Pain Assessment: Faces Faces Pain Scale: Hurts little more Pain Location: abdomen and B LEs ( also chronic back pain) Pain Descriptors / Indicators: Aching;Burning Pain Intervention(s): Monitored during session;Repositioned    Home Living                      Prior Function            PT Goals (current goals can now be found in the care plan section) Acute Rehab PT Goals Patient Stated Goal: be able to keep up with my grandkids Potential to Achieve Goals: Good Progress towards PT goals: Progressing toward goals    Frequency    Min 3X/week  PT Plan Current plan remains appropriate    Co-evaluation              AM-PAC PT "6 Clicks" Mobility   Outcome Measure  Help needed turning from your back to your side while in a flat bed without using bedrails?: A Little Help needed moving from lying on your back to sitting on the side of a flat bed without using bedrails?: A Little Help needed moving to and from a bed to a chair (including a wheelchair)?: A Little Help needed standing up from a chair using your arms (e.g., wheelchair or bedside chair)?: A Little Help needed to walk in hospital room?: A Little Help needed climbing 3-5 steps with a railing? : A Little 6 Click Score: 18    End of Session Equipment  Utilized During Treatment: Gait belt Activity Tolerance: Patient limited by pain Patient left: in bed;with call bell/phone within reach;with family/visitor present Nurse Communication: Mobility status PT Visit Diagnosis: Other abnormalities of gait and mobility (R26.89);Difficulty in walking, not elsewhere classified (R26.2);Muscle weakness (generalized) (M62.81);Pain Pain - Right/Left: Right Pain - part of body: Leg     Time: 2863-8177 PT Time Calculation (min) (ACUTE ONLY): 25 min  Charges:  $Gait Training: 8-22 mins $Therapeutic Activity: 8-22 mins                     Erasmo Leventhal , PTA Acute Rehabilitation Services Pager [phone] Office [phone]     Ethelbert Thain Eli Hose 10/11/2020, 1:04 PM

## 2020-10-11 NOTE — TOC Transition Note (Signed)
Transition of Care (TOC) - CM/SW Discharge Note Marvetta Gibbons RN, BSN Transitions of Care Unit 4E- RN Case Manager See Treatment Team for direct phone #    Patient Details  Name: Gregory [name] MRN: [id number] Date of Birth: 12/27/1950  Transition of Care [facility]) CM/SW Contact:  Dawayne Patricia, RN Phone Number: 10/11/2020, 10:24 AM   Clinical Narrative:    Pt has been cleared medically and is stable for transition home today. [facility] has returned call regarding referral for HHPT services however they are unable to accept referral at this time due to staffing. Spoke with pt again at bedside for choice- and pt states he does not have a preference other than making sure agency is in-network with H[employer] insurance. Call made to [facility] with Alvis Lemmings for HHPT referral- [facility] Lemmings is able to accept referral.    Final next level of care: [place] Barriers to Discharge: Barriers Resolved   Patient Goals and CMS Choice Patient states their goals for this hospitalization and ongoing recovery are:: return home and get stronger CMS Medicare.gov Compare Post Acute Care list provided to:: Patient Choice offered to / list presented to : Patient, Spouse  Discharge Placement                 Home with [facility]      Discharge Plan and Services   Discharge Planning Services: CM Consult Post Acute Care Choice: Home Health          DME Arranged: N/A DME Agency: NA       HH Arranged: PT HH Agency: [place] Date [place]: 10/11/20 Time [place]: 6967 Representative spoke with at [place]: [place]) Interventions     Readmission Risk Interventions Readmission Risk Prevention Plan 10/11/2020  Transportation Screening Complete  PCP or Specialist Appt within 3-5 Days Complete  HRI or [place] Complete  Social Work Consult for [place] Planning/Counseling Complete  Palliative Care  Screening Not Applicable  Medication Review Press photographer) Complete  Some recent data might be hidden

## 2020-10-11 NOTE — Progress Notes (Signed)
Patient discharged home, AVS given with discharge instructions/education provided.  All questions addressed.

## 2020-10-11 NOTE — Discharge Summary (Signed)
Aorta Discharge Summary    Gregory [name] 10-27-1951 69 y.o. male  [id number]  Admission Date: 10/03/2020  Discharge Date: 10/11/20  Physician: Serafina Mitchell, MD  Admission Diagnosis: Peripheral arterial occlusive disease [facility]) [I77.9]  Discharge Day services:    see progress note 10/11/20    Hospital Course:  The patient was admitted to the hospital and taken to the operating room on 10/03/2020 and underwent: Aortobifemoral bypass graft with reimplantation of the inferior mesenteric artery with aortic endarterectomy as well as bilateral common femoral endarterectomies by Dr. Trula Slade.  Patient tolerated the procedure well and was admitted to the ICU where he stayed for about 48 hours.  Postoperatively patient maintained well-perfused bilateral lower extremities.  After going to stepdown he developed abdominal distention and eventual nausea and vomiting.  KUB demonstrated likely ileus and NG tube was reinserted.  NG tube was eventually removed and patient progressed to tolerating a regular diet.  Based on PT/OT evaluation patient will require home health physical therapy which has been arranged by the transitions of care team.  Of note he does have a left groin incision seroma at the distal pole however at the time of discharge he does not have any drainage.  He will be discharged with #20 tramadol 50 mg to take twice a day.  He will follow-up with Dr. Trula Slade in 2 to 3 weeks.  Discharge instructions were reviewed with the patient and he voices his understanding.  He will be discharged home today with home health PT in stable condition.    CBC    Component Value Date/Time   WBC 7.1 10/09/2020 0324   RBC 2.84 (L) 10/09/2020 0324   HGB 8.7 (L) 10/09/2020 0324   HGB 16.1 11/02/2019 1000   HCT 26.2 (L) 10/09/2020 0324   HCT 47.2 11/02/2019 1000   PLT 159 10/09/2020 0324   PLT 177 11/02/2019 1000   MCV 92.3 10/09/2020 0324   MCV 90 11/02/2019 1000   MCH 30.6 10/09/2020  0324   MCHC 33.2 10/09/2020 0324   RDW 14.2 10/09/2020 0324   RDW 13.6 11/02/2019 1000   LYMPHSABS 1.7 11/28/2019 0404   LYMPHSABS 2.5 11/02/2019 1000   MONOABS 1.1 (H) 11/28/2019 0404   EOSABS 0.1 11/28/2019 0404   EOSABS 0.2 11/02/2019 1000   BASOSABS 0.0 11/28/2019 0404   BASOSABS 0.1 11/02/2019 1000    BMET    Component Value Date/Time   NA 137 10/09/2020 0324   NA 139 04/16/2020 1046   K 4.4 10/09/2020 0324   CL 103 10/09/2020 0324   CO2 24 10/09/2020 0324   GLUCOSE 91 10/09/2020 0324   BUN 24 (H) 10/09/2020 0324   BUN 14 04/16/2020 1046   CREATININE 0.92 10/09/2020 0324   CREATININE 0.77 06/29/2017 1534   CALCIUM 8.1 (L) 10/09/2020 0324   GFRNONAA >60 10/09/2020 0324   GFRAA 98 04/16/2020 1046       Discharge Diagnosis:  Peripheral arterial occlusive disease ([place]) [I77.9]  Secondary Diagnosis: Patient Active Problem List   Diagnosis Date Noted  . Peripheral arterial occlusive disease ([place]) 10/03/2020  . S/P CABG x 1 11/24/2019  . S/P aortic valve replacement with bioprosthetic valve 11/24/2019  . Aortic stenosis, severe 11/24/2019  . PAD (peripheral artery disease) ([place])   . Mild persistent asthma 10/29/2019  . Herpes zoster without complication 34/19/6222  . 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 Peripheral neuropathy   . S/P lumbar spinal fusion 10/25/2014  .  Recovering alcoholic in remission ([place]) 08/10/2014  . Substance induced mood disorder ([place]) 06/30/2014  . Substance-induced sleep disorder ([place]) 06/30/2014  . Obsessive compulsive disorder   . OSA (obstructive sleep apnea) 01/20/2014  . CAD (coronary artery disease)   . Severe aortic stenosis   . Mentally disabled 11/25/2012  . Hyperlipidemia with target LDL less than 130 11/25/2012  . Bipolar disorder ([place])   . Hypertension   . Allergic rhinitis due to pollen 08/11/2011  . Tobacco abuse    Past Medical History:  Diagnosis Date  . Alcohol abuse   .  Allergy   . Aortic stenosis   . Arthritis   . Asthma   . Bipolar disorder ([place])   . CAD (coronary artery disease)    coronary calcifications 08/2013 CTA  . Carotid artery occlusion   . Chronic back pain   . Claudication ([place])   . Depression   . Family history of skin cancer   . Heart murmur   . Hepatitis    h/o 1970,doesn't remember which type,1990 epstain-barr  . Hyperlipidemia   . Hypertension   . Neuromuscular disorder ([place])   . Obsessive compulsive disorder   . PAD (peripheral artery disease) ([place])   . Peripheral neuropathy   . Pneumonia   . RBBB   . 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 Severe aortic stenosis   . Sleep apnea    does not wear CPAP  . Smoker   . Spinal stenosis at L4-L5 level   . Tobacco abuse   . Tobacco use disorder      Allergies as of 10/11/2020      Reactions   Codeine Anaphylaxis      Medication List    TAKE these medications   acetaminophen 325 MG tablet Commonly known as: TYLENOL Take 2 tablets (650 mg total) by mouth every 6 (six) hours as needed for mild pain.   albuterol 108 (90 Base) MCG/ACT inhaler Commonly known as: VENTOLIN HFA Inhale 2 puffs into the lungs every 6 (six) hours as needed for wheezing.   amLODipine 10 MG tablet Commonly known as: NORVASC TAKE ONE TABLET BY MOUTH DAILY   amoxicillin 500 MG capsule Commonly known as: AMOXIL Take 2,000 mg by mouth See admin instructions. Take 2000 mg by mouth one hour prior to dental appointments   aspirin 325 MG EC tablet Take 1 tablet (325 mg total) by mouth daily.   atorvastatin 80 MG tablet Commonly known as: LIPITOR Take 1 tablet (80 mg total) by mouth daily.   cilostazol 100 MG tablet Commonly known as: PLETAL Take 1 tablet (100 mg total) by mouth 2 (two) times daily before a meal.   clindamycin 300 MG capsule Commonly known as: CLEOCIN Take 300 mg by mouth every 6 (six) hours. Take for (7) days. Started on 09-25-20.     divalproex 500 MG DR tablet Commonly known as: DEPAKOTE Take 1,000-1,500 mg by mouth at bedtime.   finasteride 5 MG tablet Commonly known as: PROSCAR TAKE ONE TABLET BY MOUTH DAILY   fluticasone 50 MCG/ACT nasal spray Commonly known as: FLONASE SPRAY 2 SPRAYS INTO THE AFFECTED NOSTRIL DAILY What changed:   how much to take  how to take this  when to take this   Fluticasone-Salmeterol 500-50 MCG/DOSE Aepb Commonly known as: Advair Diskus Inhale 1 puff into the lungs every 12 (twelve) hours.   lamoTRIgine 200 MG tablet Commonly known as: LAMICTAL Take 1 tablet (200 mg  total) by mouth every morning.   lisinopril-hydrochlorothiazide 20-12.5 MG tablet Commonly known as: ZESTORETIC TAKE ONE TABLET BY MOUTH EVERY MORNING What changed: when to take this   MULTIVITAMIN ADULTS 50+ PO Take 1 tablet by mouth daily.   traMADol 50 MG tablet Commonly known as: ULTRAM Take 1 tablet (50 mg total) by mouth every 12 (twelve) hours as needed for moderate pain.       Instructions:  [facility] Discharge Instructions Open Aortic Surgery  Please refer to the following instructions for your post-procedure care. Your surgeon or Physician Assistant will discuss any changes with you.  Activity  Avoid lifting more than eight pounds (a gallon of milk) until after your first post-operative visit. You are encouraged to walk as much as you can. You can slowly return to normal activities but must avoid strenuous activity and heavy lifting until your doctor tells you it's OK. Heavy lifting can hurt the incision and cause a hernia. Avoid activities such as vacuuming or swinging a golf club. It is normal to feel tired for several weeks after your surgery. Do not drive until your doctor gives the OK and you are no longer taking prescription pain medications. It is also normal to have difficulty with sleep habits, eating and bowl movements after surgery. These will go  away with time.  Bathing/Showering  You may shower after you go home. Do not soak in a bathtub, hot tub, or swim until the incision heals.  Incision Care  Shower every day. Clean your incision with mild soap and water. Pat the area dry with a clean towel. You do not need a bandage unless otherwise instructed. Do not apply any ointments or creams to your incision. You may have skin glue on your incision. Do not peel it off. It will come off on its own in about one week. If you have staples or sutures along your incision, they will be removed at your post op appointment.  If you have groin incisions, wash the groin wounds with soap and water daily and pat dry. (No tub bath-only shower)  Then put a dry gauze or washcloth in the groin to keep this area dry to help prevent wound infection.  Do this daily and as needed.  Do not use Vaseline or neosporin on your incisions.  Only use soap and water on your incisions and then protect and keep dry.  Diet  Resume your normal diet. There are no special food restriction following this procedure. A low fat/low cholesterol diet is recommended for all patients with vascular disease. After your aortic surgery, it's normal to feel full faster than usual and to not feel as hungry as you normally would. You will probably lose weight initially following your surgery. It's best to eat small, frequent meals over the course of the day. Call the office if you find that you are unable to eat even small meals. In order to heal from your surgery, it is CRITICAL to get adequate nutrition. Your body requires vitamins, minerals, and protein. Vegetables are the best source of vitamins and minerals. causing pain, you may take over-the-counter pain reliever such as acetaminophen (Tylenol). If you were prescribed a stronger pain medication, please be aware these medication can cause nausea and constipation. Prevent nausea by taking the medication with a snack or meal. Avoid  constipation by drinking plenty of fluids and eating foods with a high amount of fiber, such as fruits, vegetables and grains. Take 100mg  of  the over-the-counter stool softener Colace twice a day as needed to help with constipation. A laxative, such as Milk of Magnesia, may be recommended for you at this time. Do not take a laxative unless your surgeon or Physician Assistant. tells you it's OK. Do not take Tylenol if you are taking stronger pain medications (such as Percocet).  Follow Up  Our office will schedule a follow up appointment 2-3 weeks after discharge.  Please call us immediately for any of the following conditions    .     Severe or worsening pain in your legs or feet or in your abdomen back or chest. . Increased pain, redness drainage (pus) from your incision site. . Increased abdominal pain, bloating, nausea, vomiting, or persistent diarrhea. . Fever of 101 degrees or higher. . Swelling in your leg (s). .  Reduce your risk of vascular disease  . Stop smoking. If you would like help, call [facility] at 1-800-QUIT-NOW 339-258-4002) or [place] at 863-086-9194. . Manage your cholesterol . Maintain a desired weight . Control your diabetes . Keep your blood pressure down .  If you have any questions please call the office at [phone].   Disposition: home with [facility] PT  Patient's condition: is Fair  Follow up: 1. Dr. Trula Slade in 2 weeks   Dagoberto Ligas, PA-C [facility] [phone] 10/11/2020  8:23 AM   - For VQI Registry use -  Post-op:  Time to Extubation: [x]  In OR, [ ]  < 12 hrs, [ ]  12-24 hrs, [ ]  >=24 hrs Vasopressors Req. Post-op: No ICU Stay: 2 days in ICU Transfusion: No    MI: No, [ ]  Troponin only, [ ]  EKG or Clinical New Arrhythmia: No  Complications: CHF: No Resp failure: No, [ ]  Pneumonia, [ ]  Ventilator Chg in renal function: No, [ ]  Inc. Cr > 0.5, [ ]  Temp. Dialysis, [ ]  Permanent dialysis Leg ischemia: No, no  Surgery needed, [ ]  Yes, Surgery needed, [ ]  Amputation Bowel ischemia: No, [ ]  Medical Rx, [ ]  Surgical Rx Wound complication: No, [ ]  Superficial separation/infection, [ ]  Return to OR Return to OR: No  Return to OR for bleeding: No Stroke: No, [ ]  Minor, [ ]  Major  Discharge medications: Statin use:  Yes  ASA use:  Yes   Plavix use:  No  Beta blocker use:  No  ACEI use:  Yes ARB use:  No CCB use:  Yes Coumadin use:  No

## 2020-10-11 NOTE — Progress Notes (Addendum)
  Progress Note    10/11/2020 7:30 AM 8 Days Post-Op  Subjective:  No new complaints   Vitals:   10/11/20 0336 10/11/20 0722  BP:    Pulse: 63   Resp: 17   Temp:    SpO2: 92% 93%   Physical Exam: Lungs:  Non labored Incisions:  abd c/d/i; R groin incision c/d/i; L groin with seroma distally, dry this morning Extremities:  Feet warm and well perfused Abdomen:  soft Neurologic: A&O  CBC    Component Value Date/Time   WBC 7.1 10/09/2020 0324   RBC 2.84 (L) 10/09/2020 0324   HGB 8.7 (L) 10/09/2020 0324   HGB 16.1 11/02/2019 1000   HCT 26.2 (L) 10/09/2020 0324   HCT 47.2 11/02/2019 1000   PLT 159 10/09/2020 0324   PLT 177 11/02/2019 1000   MCV 92.3 10/09/2020 0324   MCV 90 11/02/2019 1000   MCH 30.6 10/09/2020 0324   MCHC 33.2 10/09/2020 0324   RDW 14.2 10/09/2020 0324   RDW 13.6 11/02/2019 1000   LYMPHSABS 1.7 11/28/2019 0404   LYMPHSABS 2.5 11/02/2019 1000   MONOABS 1.1 (H) 11/28/2019 0404   EOSABS 0.1 11/28/2019 0404   EOSABS 0.2 11/02/2019 1000   BASOSABS 0.0 11/28/2019 0404   BASOSABS 0.1 11/02/2019 1000    BMET    Component Value Date/Time   NA 137 10/09/2020 0324   NA 139 04/16/2020 1046   K 4.4 10/09/2020 0324   CL 103 10/09/2020 0324   CO2 24 10/09/2020 0324   GLUCOSE 91 10/09/2020 0324   BUN 24 (H) 10/09/2020 0324   BUN 14 04/16/2020 1046   CREATININE 0.92 10/09/2020 0324   CREATININE 0.77 06/29/2017 1534   CALCIUM 8.1 (L) 10/09/2020 0324   GFRNONAA >60 10/09/2020 0324   GFRAA 98 04/16/2020 1046    INR    Component Value Date/Time   INR 1.3 (H) 10/03/2020 1621     Intake/Output Summary (Last 24 hours) at 10/11/2020 0730 Last data filed at 10/10/2020 2030 Gross per 24 hour  Intake 476 ml  Output --  Net 476 ml     Assessment/Plan:  69 y.o. male is s/p ABF bypass with post op ileus 8 Days Post-Op   Tolerating regular diet BLE warm and well perfused L groin likely seroma, no drainage Ok for discharge home with [facility] PT  today Follow up in 2-3 weeks with Dr. Army Chaco, PA-C [facility] [phone] 10/11/2020 7:30 AM  I agree with the above.  Have seen and evaluated the patient.  He is scheduled for discharge home today.  [name]

## 2020-10-12 ENCOUNTER — Other Ambulatory Visit: Payer: Self-pay

## 2020-10-12 ENCOUNTER — Telehealth: Payer: Self-pay | Admitting: *Deleted

## 2020-10-12 ENCOUNTER — Ambulatory Visit (INDEPENDENT_AMBULATORY_CARE_PROVIDER_SITE_OTHER): Payer: Self-pay | Admitting: Physician Assistant

## 2020-10-12 VITALS — BP 161/73 | HR 76 | Temp 97.9°F | Resp 20 | Ht 65.5 in | Wt 152.1 lb

## 2020-10-12 DIAGNOSIS — I779 Disorder of arteries and arterioles, unspecified: Secondary | ICD-10-CM

## 2020-10-12 DIAGNOSIS — I350 Nonrheumatic aortic (valve) stenosis: Secondary | ICD-10-CM

## 2020-10-12 NOTE — Progress Notes (Signed)
Opened In Error

## 2020-10-12 NOTE — Telephone Encounter (Signed)
Patient called c/o swelling and "excessive" blood tinged drainage, soaking through multiple pads and gauze and when standing, "dripping" on the floor.  Patient's wife states that she is able to feel an area the size of a "golfball".  Patient denies fever, chills, warmth or odor.  An appointment was scheduled to see a PA today at 1:00.

## 2020-10-12 NOTE — Progress Notes (Signed)
    Post-operative Open AAA Repair   History of Present Illness   Gregory [name] is a 69 y.o. male who presents post-op s/p aortobifemoral bypass graft with reimplantation of IMA of IMA and bilateral common femoral artery endarterectomy by Dr. Trula Slade on 10/03/2020.  He was discharged from the hospital yesterday without drainage from incisions.  Patient was worked into the office schedule today due to heavy drainage from left groin incision.  The patient and his wife describe the drainage as thin yellow requiring dressing changes every 30 minutes.  He denies rest pain bilateral lower extremities.  He is tolerating a regular diet.  He denies fevers, chills, nausea/vomiting.    For VQI Use Only   PRE-ADM LIVING: Home  AMB STATUS: Ambulatory   Physical Examination   Vitals:   10/12/20 1309  BP: (!) 161/73  Pulse: 76  Resp: 20  Temp: 97.9 F (36.6 C)  TempSrc: Temporal  SpO2: 98%  Weight: 152 lb 1.6 oz (69 kg)  Height: 5' 5.5" (1.664 m)   Feet are warm and well-perfused with intact Doppler signals Abdominal incision clean dry and intact Right groin incision clean dry and intact Left groin incision with thin yellowish drainage with manipulation from mid incision; over 100 cc drained from small opening mid incision    Medical Decision Making   Gregory [name] is a 69 y.o. male who presents s/p aorto bifemoral bypass with serous fluid drainage from left groin incision   Bilateral lower extremities warm and well-perfused  Fluid collection left groin drained at the bedside; small opening at mid incision was packed with quarter inch gauze with a dry dressing over top  Patient had [facility] home health physical therapy arranged; we will contact home health service and add home health RN for complex dressing changes involving quarter inch packing to the small opening of mid incision with dry dressing over top; we will also ask home health to evaluate for incisional negative  pressure vacuum dressing.   He will follow-up with Dr. Trula Slade on November 1 for incision check; patient is aware if drainage does not improve, he may require return to the OR for placement of wound VAC   Dagoberto Ligas PA-C [facility] [place] Office: [facility] MD: Donzetta Matters

## 2020-10-15 ENCOUNTER — Other Ambulatory Visit (HOSPITAL_COMMUNITY)
Admission: RE | Admit: 2020-10-15 | Discharge: 2020-10-15 | Disposition: A | Discharge status: Home / Self Care | Payer: PPO | Source: Ambulatory Visit | Attending: Surgery | Admitting: Surgery

## 2020-10-15 ENCOUNTER — Other Ambulatory Visit: Payer: Self-pay

## 2020-10-15 ENCOUNTER — Ambulatory Visit (INDEPENDENT_AMBULATORY_CARE_PROVIDER_SITE_OTHER): Payer: Self-pay | Admitting: Physician Assistant

## 2020-10-15 VITALS — BP 117/61 | HR 61 | Temp 98.6°F | Resp 20 | Ht 65.5 in | Wt 147.8 lb

## 2020-10-15 DIAGNOSIS — Z20822 Contact with and (suspected) exposure to covid-19: Secondary | ICD-10-CM | POA: Insufficient documentation

## 2020-10-15 DIAGNOSIS — Z01812 Encounter for preprocedural laboratory examination: Secondary | ICD-10-CM | POA: Diagnosis not present

## 2020-10-15 DIAGNOSIS — I779 Disorder of arteries and arterioles, unspecified: Secondary | ICD-10-CM

## 2020-10-15 LAB — SARS CORONAVIRUS 2 (TAT 6-24 HRS): SARS Coronavirus 2: NEGATIVE

## 2020-10-15 MED ORDER — CEPHALEXIN 500 MG PO CAPS: 500.0000 mg | ORAL_CAPSULE | Freq: Three times a day (TID) | ORAL | 0 refills | Status: DC

## 2020-10-15 NOTE — H&P (View-Only) (Signed)
    Post-operative Aorta   History of Present Illness   Gregory [name] is a 69 y.o. male who presents post-op s/p aortobifemoral bypass graft with reimplantation of IMA and bilateral common femoral artery endarterectomy by Dr. Trula Slade on 10/03/2020.  He was seen a few days ago due to ongoing serous drainage from left groin.  He returns to the office today with the same problem.  He denies rest pain of bilateral lower extremities.  He is tolerating regular diet.  He denies any fevers however states he has experienced chills.   For VQI Use Only   PRE-ADM LIVING: Home  AMB STATUS: Ambulatory   Physical Examination   Vitals:   10/15/20 1334  BP: 117/61  Pulse: 61  Resp: 20  Temp: 98.6 F (37 C)  TempSrc: Temporal  SpO2: 99%  Weight: 147 lb 12.8 oz (67 kg)  Height: 5' 5.5" (1.664 m)    Feet are warm and well-perfused with Doppler signals Abdominal incision is clean dry and intact; abdomen is soft and nontender Right groin incision healing well Left groin incision with fluid collection; thin yellow serous drainage with manipulation of incision    Medical Decision Making   Gregory [name] is a 69 y.o. male who presents s/p aortobifemoral bypass with lymphatic leak of left groin   Dr. Trula Slade was involved in the evaluation and management plan of this patient today  Patient has an ongoing lymphatic leak from left groin incision  Plan will be for exploration of left groin with likely wound VAC placement in the operating room on Wednesday, 10/17/2020  He will be bridged to surgery with a Keflex prescription  [facility] is already active with patient; we will change wound care to wound VAC changes 3 times weekly starting on Friday, 10/19/2020  Procedure was discussed with the patient and his wife and they agreed to proceed   Dagoberto Ligas PA-C [facility] [place] Office: [facility] MD: Trula Slade

## 2020-10-15 NOTE — Progress Notes (Signed)
    Post-operative Aorta   History of Present Illness   Gregory [name] is a 69 y.o. male who presents post-op s/p aortobifemoral bypass graft with reimplantation of IMA and bilateral common femoral artery endarterectomy by Dr. Trula Slade on 10/03/2020.  He was seen a few days ago due to ongoing serous drainage from left groin.  He returns to the office today with the same problem.  He denies rest pain of bilateral lower extremities.  He is tolerating regular diet.  He denies any fevers however states he has experienced chills.   For VQI Use Only   PRE-ADM LIVING: Home  AMB STATUS: Ambulatory   Physical Examination   Vitals:   10/15/20 1334  BP: 117/61  Pulse: 61  Resp: 20  Temp: 98.6 F (37 C)  TempSrc: Temporal  SpO2: 99%  Weight: 147 lb 12.8 oz (67 kg)  Height: 5' 5.5" (1.664 m)    Feet are warm and well-perfused with Doppler signals Abdominal incision is clean dry and intact; abdomen is soft and nontender Right groin incision healing well Left groin incision with fluid collection; thin yellow serous drainage with manipulation of incision    Medical Decision Making   Gregory [name] is a 69 y.o. male who presents s/p aortobifemoral bypass with lymphatic leak of left groin   Dr. Trula Slade was involved in the evaluation and management plan of this patient today  Patient has an ongoing lymphatic leak from left groin incision  Plan will be for exploration of left groin with likely wound VAC placement in the operating room on Wednesday, 10/17/2020  He will be bridged to surgery with a Keflex prescription  [facility] home [facility] is already active with patient; we will change wound care to wound VAC changes 3 times weekly starting on Friday, 10/19/2020  Procedure was discussed with the patient and his wife and they agreed to proceed   Dagoberto Ligas PA-C [facility] [place] Office: [facility] MD: Trula Slade

## 2020-10-16 ENCOUNTER — Other Ambulatory Visit: Payer: Self-pay

## 2020-10-16 ENCOUNTER — Encounter (HOSPITAL_COMMUNITY): Payer: Self-pay | Admitting: Surgery

## 2020-10-16 DIAGNOSIS — T8859XA Other complications of anesthesia, initial encounter: Secondary | ICD-10-CM

## 2020-10-16 HISTORY — DX: Other complications of anesthesia, initial encounter: T88.59XA

## 2020-10-16 NOTE — Anesthesia Preprocedure Evaluation (Addendum)
Anesthesia Evaluation  Patient identified by MRN, date of birth, ID band Patient awake    Reviewed: Allergy & Precautions, NPO status , Patient's Chart, lab work & pertinent test results  Airway Mallampati: II  TM Distance: >3 FB     Dental  (+) Teeth Intact, Dental Advisory Given   Pulmonary asthma , sleep apnea , Current SmokerPatient did not abstain from smoking.,    breath sounds clear to auscultation       Cardiovascular hypertension, Pt. on medications + CAD and + Peripheral Vascular Disease  Valvular problems/murmurs: S/p AVR.  Rhythm:Regular Rate:Normal  12/11/19 TTE  1. Left ventricular ejection fraction, by visual estimation, is 70 to  75%. The left ventricle has normal function. There is no left ventricular  hypertrophy.  2. The left ventricle has no regional wall motion abnormalities.  3. Global right ventricle has normal systolic function.The right  ventricular size is normal. No increase in right ventricular wall  thickness.    Neuro/Psych Anxiety Depression Bipolar Disorder    GI/Hepatic negative GI ROS, (+) Hepatitis -  Endo/Other  negative endocrine ROS  Renal/GU negative Renal ROS     Musculoskeletal  (+) Arthritis ,   Abdominal   Peds  Hematology   Anesthesia Other Findings   Reproductive/Obstetrics                           Anesthesia Physical Anesthesia Plan  ASA: III  Anesthesia Plan: General   Post-op Pain Management:    Induction: Intravenous  PONV Risk Score and Plan: 2 and Treatment may vary due to age or medical condition, Ondansetron and Midazolam  Airway Management Planned: Oral ETT  Additional Equipment: None  Intra-op Plan:   Post-operative Plan: Possible Post-op intubation/ventilation  Informed Consent: I have reviewed the patients History and Physical, chart, labs and discussed the procedure including the risks, benefits and alternatives for  the proposed anesthesia with the patient or authorized representative who has indicated his/her understanding and acceptance.     Dental advisory given  Plan Discussed with: CRNA and Anesthesiologist  Anesthesia Plan Comments: (GA)      Anesthesia Quick Evaluation

## 2020-10-16 NOTE — Progress Notes (Signed)
Mr. Poet nies chest pain or shortness of breath. Patient was tested for Covid and has been in quarantine since that time.

## 2020-10-17 ENCOUNTER — Ambulatory Visit (HOSPITAL_COMMUNITY): Payer: PPO | Admitting: Certified Registered"

## 2020-10-17 ENCOUNTER — Encounter (HOSPITAL_COMMUNITY)
Admission: RE | Disposition: A | Discharge status: Home / Self Care | Payer: Self-pay | Source: Home / Self Care | Attending: Surgery

## 2020-10-17 ENCOUNTER — Ambulatory Visit (HOSPITAL_COMMUNITY)
Admission: RE | Admit: 2020-10-17 | Discharge: 2020-10-17 | Disposition: A | Discharge status: Home / Self Care | Payer: PPO | Attending: Surgery | Admitting: Surgery

## 2020-10-17 ENCOUNTER — Telehealth: Payer: Self-pay | Admitting: Family Medicine

## 2020-10-17 ENCOUNTER — Encounter (HOSPITAL_COMMUNITY): Payer: Self-pay | Admitting: Surgery

## 2020-10-17 DIAGNOSIS — M1990 Unspecified osteoarthritis, unspecified site: Secondary | ICD-10-CM | POA: Insufficient documentation

## 2020-10-17 DIAGNOSIS — I350 Nonrheumatic aortic (valve) stenosis: Secondary | ICD-10-CM | POA: Diagnosis not present

## 2020-10-17 DIAGNOSIS — J45909 Unspecified asthma, uncomplicated: Secondary | ICD-10-CM | POA: Insufficient documentation

## 2020-10-17 DIAGNOSIS — G4730 Sleep apnea, unspecified: Secondary | ICD-10-CM | POA: Insufficient documentation

## 2020-10-17 DIAGNOSIS — T8189XA Other complications of procedures, not elsewhere classified, initial encounter: Secondary | ICD-10-CM | POA: Diagnosis not present

## 2020-10-17 DIAGNOSIS — L7634 Postprocedural seroma of skin and subcutaneous tissue following other procedure: Secondary | ICD-10-CM | POA: Insufficient documentation

## 2020-10-17 DIAGNOSIS — F319 Bipolar disorder, unspecified: Secondary | ICD-10-CM | POA: Insufficient documentation

## 2020-10-17 DIAGNOSIS — G4733 Obstructive sleep apnea (adult) (pediatric): Secondary | ICD-10-CM | POA: Diagnosis not present

## 2020-10-17 DIAGNOSIS — I10 Essential (primary) hypertension: Secondary | ICD-10-CM | POA: Diagnosis not present

## 2020-10-17 DIAGNOSIS — Y838 Other surgical procedures as the cause of abnormal reaction of the patient, or of later complication, without mention of misadventure at the time of the procedure: Secondary | ICD-10-CM | POA: Diagnosis not present

## 2020-10-17 DIAGNOSIS — I97648 Postprocedural seroma of a circulatory system organ or structure following other circulatory system procedure: Secondary | ICD-10-CM | POA: Diagnosis not present

## 2020-10-17 DIAGNOSIS — I739 Peripheral vascular disease, unspecified: Secondary | ICD-10-CM | POA: Diagnosis not present

## 2020-10-17 DIAGNOSIS — F419 Anxiety disorder, unspecified: Secondary | ICD-10-CM | POA: Diagnosis not present

## 2020-10-17 DIAGNOSIS — I2510 Atherosclerotic heart disease of native coronary artery without angina pectoris: Secondary | ICD-10-CM | POA: Diagnosis not present

## 2020-10-17 HISTORY — PX: APPLICATION OF WOUND VAC: SHX5189

## 2020-10-17 HISTORY — PX: INCISION AND DRAINAGE OF WOUND: SHX1803

## 2020-10-17 LAB — POCT I-STAT, CHEM 8
BUN: 10 mg/dL (ref 8–23)
Calcium, Ion: 0.86 mmol/L — CL (ref 1.15–1.40)
Chloride: 104 mmol/L (ref 98–111)
Creatinine, Ser: 0.8 mg/dL (ref 0.61–1.24)
Glucose, Bld: 101 mg/dL — ABNORMAL HIGH (ref 70–99)
HCT: 29 % — ABNORMAL LOW (ref 39.0–52.0)
Hemoglobin: 9.9 g/dL — ABNORMAL LOW (ref 13.0–17.0)
Potassium: 4.1 mmol/L (ref 3.5–5.1)
Sodium: 133 mmol/L — ABNORMAL LOW (ref 135–145)
TCO2: 26 mmol/L (ref 22–32)

## 2020-10-17 SURGERY — APPLICATION, WOUND VAC
Anesthesia: General | Site: Groin | Laterality: Left

## 2020-10-17 MED ORDER — FENTANYL CITRATE (PF) 100 MCG/2ML IJ SOLN
INTRAMUSCULAR | Status: DC | PRN
  Administered 2020-10-17 (×2): 25 ug via INTRAVENOUS

## 2020-10-17 MED ORDER — PROPOFOL 10 MG/ML IV BOLUS
INTRAVENOUS | Status: DC | PRN
  Administered 2020-10-17: 140 mg via INTRAVENOUS

## 2020-10-17 MED ORDER — 0.9 % SODIUM CHLORIDE (POUR BTL) OPTIME
TOPICAL | Status: DC | PRN
  Administered 2020-10-17: 1000 mL

## 2020-10-17 MED ORDER — FENTANYL CITRATE (PF) 100 MCG/2ML IJ SOLN
25.0000 ug | INTRAMUSCULAR | Status: DC | PRN
  Administered 2020-10-17: 50 ug via INTRAVENOUS

## 2020-10-17 MED ORDER — FENTANYL CITRATE (PF) 100 MCG/2ML IJ SOLN: 25.0000 ug | INTRAMUSCULAR | Status: DC | PRN

## 2020-10-17 MED ORDER — ONDANSETRON HCL 4 MG/2ML IJ SOLN: 4.0000 mg | Freq: Once | INTRAMUSCULAR | Status: DC | PRN

## 2020-10-17 MED ORDER — PROPOFOL 10 MG/ML IV BOLUS
INTRAVENOUS | Status: AC
  Filled 2020-10-17: qty 20

## 2020-10-17 MED ORDER — ONDANSETRON HCL 4 MG/2ML IJ SOLN
INTRAMUSCULAR | Status: DC | PRN
  Administered 2020-10-17: 4 mg via INTRAVENOUS

## 2020-10-17 MED ORDER — CHLORHEXIDINE GLUCONATE CLOTH 2 % EX PADS: 6.0000 | MEDICATED_PAD | Freq: Once | CUTANEOUS | Status: DC

## 2020-10-17 MED ORDER — LIDOCAINE 2% (20 MG/ML) 5 ML SYRINGE
INTRAMUSCULAR | Status: DC | PRN
  Administered 2020-10-17: 100 mg via INTRAVENOUS

## 2020-10-17 MED ORDER — ACETAMINOPHEN 10 MG/ML IV SOLN: 1000.0000 mg | Freq: Once | INTRAVENOUS | Status: DC | PRN

## 2020-10-17 MED ORDER — LACTATED RINGERS IV SOLN: INTRAVENOUS | Status: DC

## 2020-10-17 MED ORDER — SODIUM CHLORIDE 0.9 % IV SOLN: INTRAVENOUS | Status: DC

## 2020-10-17 MED ORDER — FENTANYL CITRATE (PF) 100 MCG/2ML IJ SOLN
INTRAMUSCULAR | Status: AC
  Filled 2020-10-17: qty 2

## 2020-10-17 MED ORDER — FENTANYL CITRATE (PF) 250 MCG/5ML IJ SOLN
INTRAMUSCULAR | Status: AC
  Filled 2020-10-17: qty 5

## 2020-10-17 MED ORDER — CEFAZOLIN SODIUM-DEXTROSE 2-4 GM/100ML-% IV SOLN
2.0000 g | INTRAVENOUS | Status: AC
  Administered 2020-10-17: 2 g via INTRAVENOUS
  Filled 2020-10-17: qty 100

## 2020-10-17 MED ORDER — CHLORHEXIDINE GLUCONATE 0.12 % MT SOLN
15.0000 mL | OROMUCOSAL | Status: AC
  Administered 2020-10-17: 15 mL via OROMUCOSAL
  Filled 2020-10-17 (×2): qty 15

## 2020-10-17 MED ORDER — SODIUM CHLORIDE 0.9 % IR SOLN
Status: DC | PRN
  Administered 2020-10-17: 3000 mL

## 2020-10-17 SURGICAL SUPPLY — 54 items
BNDG ELASTIC 4X5.8 VLCR STR LF (GAUZE/BANDAGES/DRESSINGS) IMPLANT
BNDG ELASTIC 6X5.8 VLCR STR LF (GAUZE/BANDAGES/DRESSINGS) IMPLANT
BNDG GAUZE ELAST 4 BULKY (GAUZE/BANDAGES/DRESSINGS) IMPLANT
CANISTER SUCT 3000ML PPV (MISCELLANEOUS) IMPLANT
CLIP VESOCCLUDE MED 6/CT (CLIP) ×2 IMPLANT
CLIP VESOCCLUDE SM WIDE 6/CT (CLIP) ×2 IMPLANT
COVER SURGICAL LIGHT HANDLE (MISCELLANEOUS) ×2 IMPLANT
COVER WAND RF STERILE (DRAPES) IMPLANT
DRAPE HALF SHEET 40X57 (DRAPES) IMPLANT
DRAPE INCISE IOBAN 66X45 STRL (DRAPES) ×2 IMPLANT
DRAPE ORTHO SPLIT 77X108 STRL (DRAPES) ×1
DRAPE SURG ORHT 6 SPLT 77X108 (DRAPES) ×1 IMPLANT
DRAPE U-SHAPE 76X120 STRL (DRAPES) IMPLANT
DRSG VAC ATS LRG SENSATRAC (GAUZE/BANDAGES/DRESSINGS) IMPLANT
DRSG VAC ATS MED SENSATRAC (GAUZE/BANDAGES/DRESSINGS) IMPLANT
ELECT REM PT RETURN 9FT ADLT (ELECTROSURGICAL) ×2
ELECTRODE REM PT RTRN 9FT ADLT (ELECTROSURGICAL) ×1 IMPLANT
GAUZE SPONGE 4X4 12PLY STRL (GAUZE/BANDAGES/DRESSINGS) ×2 IMPLANT
GAUZE XEROFORM 5X9 LF (GAUZE/BANDAGES/DRESSINGS) IMPLANT
GLOVE BIO SURGEON STRL SZ7.5 (GLOVE) ×2 IMPLANT
GLOVE BIOGEL PI IND STRL 7.5 (GLOVE) ×1 IMPLANT
GLOVE BIOGEL PI IND STRL 8 (GLOVE) ×1 IMPLANT
GLOVE BIOGEL PI INDICATOR 7.5 (GLOVE) ×1
GLOVE BIOGEL PI INDICATOR 8 (GLOVE) ×1
GLOVE SURG SS PI 7.5 STRL IVOR (GLOVE) ×2 IMPLANT
GOWN STRL REUS W/ TWL LRG LVL3 (GOWN DISPOSABLE) ×2 IMPLANT
GOWN STRL REUS W/ TWL XL LVL3 (GOWN DISPOSABLE) ×2 IMPLANT
GOWN STRL REUS W/TWL LRG LVL3 (GOWN DISPOSABLE) ×2
GOWN STRL REUS W/TWL XL LVL3 (GOWN DISPOSABLE) ×2
HANDPIECE INTERPULSE COAX TIP (DISPOSABLE)
IV NS IRRIG 3000ML ARTHROMATIC (IV SOLUTION) ×2 IMPLANT
KIT BASIN OR (CUSTOM PROCEDURE TRAY) ×2 IMPLANT
KIT DRSG PREVENA PLUS 7DAY 125 (MISCELLANEOUS) ×2 IMPLANT
KIT PREVENA INCISION MGT 13 (CANNISTER) ×2 IMPLANT
KIT TURNOVER KIT B (KITS) ×2 IMPLANT
NS IRRIG 1000ML POUR BTL (IV SOLUTION) ×2 IMPLANT
PACK CV ACCESS (CUSTOM PROCEDURE TRAY) IMPLANT
PACK GENERAL/GYN (CUSTOM PROCEDURE TRAY) ×2 IMPLANT
PACK UNIVERSAL I (CUSTOM PROCEDURE TRAY) ×2 IMPLANT
PAD ARMBOARD 7.5X6 YLW CONV (MISCELLANEOUS) ×4 IMPLANT
PULSAVAC PLUS IRRIG FAN TIP (DISPOSABLE)
SET HNDPC FAN SPRY TIP SCT (DISPOSABLE) IMPLANT
STAPLER VISISTAT 35W (STAPLE) IMPLANT
SUT ETHILON 2 0 PSLX (SUTURE) IMPLANT
SUT ETHILON 3 0 PS 1 (SUTURE) IMPLANT
SUT VIC AB 2-0 CT1 27 (SUTURE) ×3
SUT VIC AB 2-0 CT1 TAPERPNT 27 (SUTURE) ×3 IMPLANT
SUT VIC AB 2-0 CTX 36 (SUTURE) IMPLANT
SUT VIC AB 3-0 SH 27 (SUTURE)
SUT VIC AB 3-0 SH 27X BRD (SUTURE) IMPLANT
SUT VICRYL 4-0 PS2 18IN ABS (SUTURE) IMPLANT
TIP FAN IRRIG PULSAVAC PLUS (DISPOSABLE) IMPLANT
TOWEL GREEN STERILE (TOWEL DISPOSABLE) ×2 IMPLANT
WATER STERILE IRR 1000ML POUR (IV SOLUTION) ×2 IMPLANT

## 2020-10-17 NOTE — Anesthesia Procedure Notes (Signed)
Procedure Name: LMA Insertion Date/Time: 10/17/2020 12:00 PM Performed by: Babs Bertin, CRNA Pre-anesthesia Checklist: Patient identified, Emergency Drugs available, Suction available and Patient being monitored Patient Re-evaluated:Patient Re-evaluated prior to induction Oxygen Delivery Method: Circle System Utilized Preoxygenation: Pre-oxygenation with 100% oxygen Induction Type: IV induction Ventilation: Mask ventilation without difficulty LMA: LMA inserted LMA Size: 4.0 Number of attempts: 1 Airway Equipment and Method: Bite block Placement Confirmation: positive ETCO2 Tube secured with: Tape Dental Injury: Teeth and Oropharynx as per pre-operative assessment

## 2020-10-17 NOTE — Telephone Encounter (Signed)
[facility] and states that they are going to start home care and therapy on thursday due to pt having a COVID test today for a procedure coming up She can be reached at [phone] with any questions,

## 2020-10-17 NOTE — Anesthesia Postprocedure Evaluation (Signed)
Anesthesia Post Note  Patient: Gregory [name]  Procedure(s) Performed: APPLICATION OF WOUND VAC (Left Groin) EXPLORATION LEFT GROIN WOUND (Left Groin)     Patient location during evaluation: PACU Anesthesia Type: General Level of consciousness: awake and alert Pain management: pain level controlled Vital Signs Assessment: post-procedure vital signs reviewed and stable Respiratory status: spontaneous breathing, nonlabored ventilation, respiratory function stable and patient connected to nasal cannula oxygen Cardiovascular status: blood pressure returned to baseline and stable Postop Assessment: no apparent nausea or vomiting Anesthetic complications: no   No complications documented.  Last Vitals:  Vitals:   10/17/20 1335 10/17/20 1340  BP: 140/69   Pulse: 64   Resp: (!) 24   Temp:  36.5 C  SpO2: 96%     Last Pain:  Vitals:   10/17/20 1335  TempSrc:   PainSc: 5                  Barnet Glasgow

## 2020-10-17 NOTE — Interval H&P Note (Signed)
History and Physical Interval Note:  10/17/2020 11:46 AM  Gregory [name]  has presented today for surgery, with the diagnosis of NON HEALING SURGICAL WOUND.  The various methods of treatment have been discussed with the patient and family. After consideration of risks, benefits and other options for treatment, the patient has consented to  Procedure(s): APPLICATION OF WOUND VAC (Left) EXPLORATION LEFT GROIN WOUND (Left) as a surgical intervention.  The patient's history has been reviewed, patient examined, no change in status, stable for surgery.  I have reviewed the patient's chart and labs.  Questions were answered to the patient's satisfaction.     [name]

## 2020-10-17 NOTE — Transfer of Care (Signed)
Immediate Anesthesia Transfer of Care Note  Patient: Gregory [name]  Procedure(s) Performed: APPLICATION OF WOUND VAC (Left Groin) EXPLORATION LEFT GROIN WOUND (Left Groin)  Patient Location: PACU  Anesthesia Type:General  Level of Consciousness: awake, alert  and oriented  Airway & Oxygen Therapy: Patient Spontanous Breathing  Post-op Assessment: Report given to RN and Post -op Vital signs reviewed and stable  Post vital signs: Reviewed and stable  Last Vitals:  Vitals Value Taken Time  BP 133/72 10/17/20 1253  Temp    Pulse 62 10/17/20 1253  Resp 14 10/17/20 1253  SpO2 95 % 10/17/20 1253  Vitals shown include unvalidated device data.  Last Pain:  Vitals:   10/17/20 0834  TempSrc:   PainSc: 4       Patients Stated Pain Goal: 0 (71/99/41 2904)  Complications: No complications documented.

## 2020-10-17 NOTE — Op Note (Signed)
    Patient name: Gregory [name] MRN: [id number] DOB: 1951/02/26 Sex: male  10/17/2020 Pre-operative Diagnosis: Left groin seroma Post-operative diagnosis:  Same Surgeon:  [name] Assistants: Laurence Slate Procedure:   #1: I&D of left groin seroma   #2: Placement of wound VAC (7 x 1 x 1 cm) Anesthesia: General Blood Loss: Minimal Specimens: None  Findings: Large seroma cavity was encountered.  No gross contamination.  3 L of irrigation were performed  Indications: The patient is recently status post aortobifemoral bypass graft.  He developed a left groin seroma which was spontaneously drainage.  I recommended surgical exploration.  Procedure:  The patient was identified in the holding area and taken to [place] [facility]  The patient was then placed supine on the table. general anesthesia was administered.  The patient was prepped and draped in the usual sterile fashion.  A time out was called and antibiotics were administered.  A PA was necessary to expedite the procedure and assist with irrigation and wound VAC placement  The patient's previous left groin incision was opened with a 10 blade.  Sharp dissection with Metzenbaum scissors was used to remove the previously placed sutures.  A large seroma cavity was encountered distal to the anastomosis.  I fully exposed the dacryon graft in the left groin.  Any potential lymphatic drainage source was cauterized.  I then irrigated the wound using a Pulsavac with 3 L of normal saline.  Cautery was then used for hemostasis.  I then reapproximated the deep tissue with multiple layers of 2-0 Vicryl.  I left the skin open.  The incision opening measured 7 cm in length by 1 cm in width and depth.  A Prevena wound VAC was placed.  This will be converted to a home VAC tomorrow.  The patient tolerated the procedure well there were no immediate complications.  Disposition:.  To PACU stable     V. [name], M.D., [facility] [place] Office: [phone] Pager:  [phone]

## 2020-10-18 ENCOUNTER — Telehealth: Payer: Self-pay | Admitting: Internal Medicine

## 2020-10-18 ENCOUNTER — Encounter (HOSPITAL_COMMUNITY): Payer: Self-pay | Admitting: Surgery

## 2020-10-18 ENCOUNTER — Telehealth: Payer: Self-pay

## 2020-10-18 DIAGNOSIS — I739 Peripheral vascular disease, unspecified: Secondary | ICD-10-CM | POA: Diagnosis not present

## 2020-10-18 NOTE — Telephone Encounter (Signed)
Orders for wound vac sent to [facility] @ [facility]

## 2020-10-18 NOTE — Telephone Encounter (Signed)
Left message for Gregory [name] to call me back

## 2020-10-18 NOTE — Telephone Encounter (Signed)
ok 

## 2020-10-18 NOTE — Telephone Encounter (Signed)
Beth with [facility] called and would like verbal orders for nursing for a wound vac. Once a week  X 1 week, twice a week x 1 week, 3 times a week x 3 weeks  Please call with verbal [phone]

## 2020-10-19 ENCOUNTER — Telehealth: Payer: Self-pay

## 2020-10-19 ENCOUNTER — Encounter: Payer: Self-pay | Admitting: *Deleted

## 2020-10-19 DIAGNOSIS — T8189XA Other complications of procedures, not elsewhere classified, initial encounter: Secondary | ICD-10-CM | POA: Diagnosis not present

## 2020-10-19 NOTE — Progress Notes (Signed)
Patient came to office because Prevena wound vac was alarming consistently since the night before. Wound vac had a full canister and was not suctioning. Vac had been on for less than 48 hours. Dressing was removed. The skin was not closed on the incision and drainage had collected under the skin in the medial thigh. Drainage was clear and had no odor. Wound was visibly continuing to drain as wound was inspected. Dr. Stanford Breed was brought in to inspect wound. There was no wound vac supplies to redress the wound with and a dry dressing will quickly saturate. Redressed wound with guaze and ABD until a regular wound vac can be applied with home health per Dr. Stanford Breed

## 2020-10-19 NOTE — Telephone Encounter (Signed)
Confirmed with [facility] @ [facility] that the wound vac would be delivered to pt's home this evening, also confirmed with Hassan Rowan, clinical manger @ [facility] that a nurse will go out tomorrow and apply the wound vac, was unable to specify a time

## 2020-10-19 NOTE — Telephone Encounter (Signed)
Beth notifed

## 2020-10-19 NOTE — Telephone Encounter (Signed)
Left message for beth to call me back

## 2020-10-22 ENCOUNTER — Ambulatory Visit: Payer: PPO | Admitting: Surgery

## 2020-10-22 ENCOUNTER — Telehealth: Payer: Self-pay | Admitting: *Deleted

## 2020-10-22 ENCOUNTER — Other Ambulatory Visit: Payer: Self-pay | Admitting: Physician Assistant

## 2020-10-22 ENCOUNTER — Telehealth: Payer: Self-pay

## 2020-10-22 DIAGNOSIS — Z951 Presence of aortocoronary bypass graft: Secondary | ICD-10-CM | POA: Diagnosis not present

## 2020-10-22 DIAGNOSIS — Z79899 Other long term (current) drug therapy: Secondary | ICD-10-CM | POA: Diagnosis not present

## 2020-10-22 DIAGNOSIS — F319 Bipolar disorder, unspecified: Secondary | ICD-10-CM | POA: Diagnosis not present

## 2020-10-22 DIAGNOSIS — G4733 Obstructive sleep apnea (adult) (pediatric): Secondary | ICD-10-CM | POA: Diagnosis not present

## 2020-10-22 DIAGNOSIS — E785 Hyperlipidemia, unspecified: Secondary | ICD-10-CM | POA: Diagnosis not present

## 2020-10-22 DIAGNOSIS — M48061 Spinal stenosis, lumbar region without neurogenic claudication: Secondary | ICD-10-CM | POA: Diagnosis not present

## 2020-10-22 DIAGNOSIS — Z9181 History of falling: Secondary | ICD-10-CM | POA: Diagnosis not present

## 2020-10-22 DIAGNOSIS — Z953 Presence of xenogenic heart valve: Secondary | ICD-10-CM | POA: Diagnosis not present

## 2020-10-22 DIAGNOSIS — Z8701 Personal history of pneumonia (recurrent): Secondary | ICD-10-CM | POA: Diagnosis not present

## 2020-10-22 DIAGNOSIS — I739 Peripheral vascular disease, unspecified: Secondary | ICD-10-CM | POA: Diagnosis not present

## 2020-10-22 DIAGNOSIS — Z7982 Long term (current) use of aspirin: Secondary | ICD-10-CM | POA: Diagnosis not present

## 2020-10-22 DIAGNOSIS — I452 Bifascicular block: Secondary | ICD-10-CM | POA: Diagnosis not present

## 2020-10-22 DIAGNOSIS — F429 Obsessive-compulsive disorder, unspecified: Secondary | ICD-10-CM | POA: Diagnosis not present

## 2020-10-22 DIAGNOSIS — F419 Anxiety disorder, unspecified: Secondary | ICD-10-CM | POA: Diagnosis not present

## 2020-10-22 DIAGNOSIS — J4530 Mild persistent asthma, uncomplicated: Secondary | ICD-10-CM | POA: Diagnosis not present

## 2020-10-22 DIAGNOSIS — I2510 Atherosclerotic heart disease of native coronary artery without angina pectoris: Secondary | ICD-10-CM | POA: Diagnosis not present

## 2020-10-22 DIAGNOSIS — G709 Myoneural disorder, unspecified: Secondary | ICD-10-CM | POA: Diagnosis not present

## 2020-10-22 DIAGNOSIS — I97648 Postprocedural seroma of a circulatory system organ or structure following other circulatory system procedure: Secondary | ICD-10-CM | POA: Diagnosis not present

## 2020-10-22 DIAGNOSIS — Z7951 Long term (current) use of inhaled steroids: Secondary | ICD-10-CM | POA: Diagnosis not present

## 2020-10-22 DIAGNOSIS — G8929 Other chronic pain: Secondary | ICD-10-CM | POA: Diagnosis not present

## 2020-10-22 DIAGNOSIS — Z981 Arthrodesis status: Secondary | ICD-10-CM | POA: Diagnosis not present

## 2020-10-22 DIAGNOSIS — F17210 Nicotine dependence, cigarettes, uncomplicated: Secondary | ICD-10-CM | POA: Diagnosis not present

## 2020-10-22 DIAGNOSIS — K5900 Constipation, unspecified: Secondary | ICD-10-CM | POA: Diagnosis not present

## 2020-10-22 DIAGNOSIS — I119 Hypertensive heart disease without heart failure: Secondary | ICD-10-CM | POA: Diagnosis not present

## 2020-10-22 DIAGNOSIS — G629 Polyneuropathy, unspecified: Secondary | ICD-10-CM | POA: Diagnosis not present

## 2020-10-22 MED ORDER — CEPHALEXIN 500 MG PO CAPS: 500.0000 mg | ORAL_CAPSULE | Freq: Three times a day (TID) | ORAL | 0 refills | Status: AC

## 2020-10-22 MED ORDER — TRAMADOL HCL 50 MG PO TABS
50.0000 mg | ORAL_TABLET | Freq: Two times a day (BID) | ORAL | 0 refills | Status: DC | PRN
Start: 2020-10-22 — End: 2020-11-02

## 2020-10-22 NOTE — Telephone Encounter (Signed)
Pt had questions regarding wound vac and medications. Instructed pt that he does not need home health to change his wound vac on wed. We will change it at the office during his appt. He also notified us he was about to run out of keflex and tramadol. I spoke with Dr. Trula Slade and both of those have been reordered. He noted that his wound vac canister was filling up very quickly. He confirmed that his wife knows how to change the cannister. Assured pt that getting the fluid away from the wound was good, and he will be evaluated at his office appt on wed.

## 2020-10-22 NOTE — Telephone Encounter (Signed)
Beth, RN @ [facility] called to report that L groin incision is producing copious amounts of drainage, temperature was 100.6 Friday 10/29 and 96.8 today on assessment. Also reported that wound was about 75% slough without much granulation tissue noted. Still a significant amount of swelling around the incision, no odor or other obvious signs of infection. She stated that the pt is taking Tylenol dose not given but is unsure of when the pt is taking it and how it's affecting his temperature readings. She stated she advised his wife to take temperature in the evenings and report any finding over 100.5. Gregory [name] was made aware that pt has f/u appt scheduled for 10/24/20 in our office.  Advised that I would speak with Gregory [name] our Publishing copy. Per Gregory [name] none of the information given was unknown and pt will be assessed on 10/24/20 and recommendations would be make at that visit.

## 2020-10-23 ENCOUNTER — Telehealth: Payer: Self-pay

## 2020-10-23 NOTE — Telephone Encounter (Signed)
ok 

## 2020-10-23 NOTE — Telephone Encounter (Signed)
Gregory [name] with [facility] t# [phone] called for verbal order to continue PT to focus on balance, stability & trying to get him to use cane for safety & fall prevention

## 2020-10-23 NOTE — Telephone Encounter (Signed)
Lowella Fairy and gave verbal order

## 2020-10-24 ENCOUNTER — Ambulatory Visit (INDEPENDENT_AMBULATORY_CARE_PROVIDER_SITE_OTHER): Payer: Self-pay | Admitting: Physician Assistant

## 2020-10-24 ENCOUNTER — Other Ambulatory Visit: Payer: Self-pay | Admitting: Physician Assistant

## 2020-10-24 ENCOUNTER — Other Ambulatory Visit: Payer: Self-pay

## 2020-10-24 ENCOUNTER — Encounter: Payer: Self-pay | Admitting: Physician Assistant

## 2020-10-24 ENCOUNTER — Telehealth: Payer: Self-pay

## 2020-10-24 VITALS — BP 112/61 | HR 69 | Temp 98.7°F | Resp 20 | Ht 65.5 in | Wt 138.8 lb

## 2020-10-24 DIAGNOSIS — Z95828 Presence of other vascular implants and grafts: Secondary | ICD-10-CM | POA: Diagnosis not present

## 2020-10-24 NOTE — Telephone Encounter (Signed)
I spoke with [facility] from [place]. I advised the specimen would be placed outside in our pick-up box. He voiced his understanding and provided Confirmation #[id number].

## 2020-10-24 NOTE — Progress Notes (Signed)
POST OPERATIVE OFFICE NOTE    CC:  F/u for surgery  HPI:  This is a 69 y.o. male who is s/p aortobifemoral bypass grafting with reimplantation of the IMA, aortic endarterectomy and bilateral CFA endarterectomies on 10/03/2020 by Dr. Trula Slade.  His post operative course was complicated by ileus requiring re-insertion of NGT.  He also developed a seroma of the left groin and at discharge on 10/11/2020, he did not have any drainage.   He was seen in the office the next day for increased drainage from the left groin.  He and his wife described it as yellow requiring dressing changes every 30 minutes.  He was not having any rest pain and was tolerating diet.  He did not have any fever or chills.  The fluid collection was drained and packed with quarter inch gauze with dry dressing.  HH was arranged for wound vac to help manage drainage. Pt was to return to see Dr. Trula Slade on 11/1.  He returned to the office again on 10/15/2020 with ongoing drainage and otherwise, no changes.  He was set up for left groin exploration on 10/17/2020.  On that day, he was taken to the OR and underwent I&D of left groin seroma and placement of wound vac. He was found to have a large seroma cavity without gross contamination.  3L of irrigation was performed.  He came back to the office on 10/29 and saw the RN.  At that time, the Martin Majestic was alarming consistently and wound vac canister was full.  Dr. Stanford Breed inspected the wound and [facility] was arranged for regular vac placement.  There were no supplies in the office and therefore, a regular dressing was placed.  HHRN called to say there was copious amounts of drainage from the wound and pt had temp on 100.6 and wound had about 75% slough without much granulation tissue.  there was no odor or obvious signs of infection.  He also called on 11/1 with questions regarding vac.  He was instructed to come to the office on 11/3 and we would change the vac here.  He was given more Keflex and Tramadol.     Pt returns today for follow up.  He states that he was having trouble with the canister filling up quite a bit before Sunday.  Then the canister has not been changed since Monday and it is less than half full today.  When the dressing was removed, his wife states the swelling around the incision is much better.  Also states the swelling in his legs have improved as well.  He is tolerating his diet.  Overall, he is doing well.  He has been working with PT.  He continues to take his Keflex.    Allergies  Allergen Reactions  . Codeine Anaphylaxis    Current Outpatient Medications  Medication Sig Dispense Refill  . acetaminophen (TYLENOL) 325 MG tablet Take 2 tablets (650 mg total) by mouth every 6 (six) hours as needed for mild pain.    [name] albuterol (PROVENTIL HFA;VENTOLIN HFA) 108 (90 Base) MCG/ACT inhaler Inhale 2 puffs into the lungs every 6 (six) hours as needed for wheezing. 3 Inhaler 1  . amLODipine (NORVASC) 10 MG tablet TAKE ONE TABLET BY MOUTH DAILY (Patient taking differently: Take 10 mg by mouth daily. ) 90 tablet 0  . aspirin EC 325 MG EC tablet Take 1 tablet (325 mg total) by mouth daily. 30 tablet 0  . atorvastatin (LIPITOR) 80 MG tablet Take 1 tablet (  80 mg total) by mouth daily. 90 tablet 3  . cephALEXin (KEFLEX) 500 MG capsule Take 1 capsule (500 mg total) by mouth 3 (three) times daily for 7 days. 21 capsule 0  . divalproex (DEPAKOTE) 500 MG DR tablet Take 1,000-1,500 mg by mouth at bedtime.     . docusate sodium (COLACE) 100 MG capsule Take 100 mg by mouth in the morning, at noon, and at bedtime. 2 in am 1 at hs    . finasteride (PROSCAR) 5 MG tablet TAKE ONE TABLET BY MOUTH DAILY (Patient taking differently: Take 5 mg by mouth daily. ) 90 tablet 1  . fluticasone (FLONASE) 50 MCG/ACT nasal spray SPRAY 2 SPRAYS INTO THE AFFECTED NOSTRIL DAILY (Patient taking differently: Place 2 sprays into both nostrils daily. SPRAY 2 SPRAYS INTO THE AFFECTED NOSTRIL DAILY) 48 mL 10  .  Fluticasone-Salmeterol (ADVAIR DISKUS) 500-50 MCG/DOSE AEPB Inhale 1 puff into the lungs every 12 (twelve) hours. 180 each 3  . lamoTRIgine (LAMICTAL) 200 MG tablet Take 1 tablet (200 mg total) by mouth every morning. 90 tablet 3  . lisinopril-hydrochlorothiazide (ZESTORETIC) 20-12.5 MG tablet TAKE ONE TABLET BY MOUTH EVERY MORNING (Patient taking differently: Take 1 tablet by mouth daily. ) 90 tablet 1  . Multiple Vitamins-Minerals (MULTIVITAMIN ADULTS 50+ PO) Take 1 tablet by mouth daily.     . traMADol (ULTRAM) 50 MG tablet Take 1 tablet (50 mg total) by mouth every 12 (twelve) hours as needed for moderate pain. 20 tablet 0   No current facility-administered medications for this visit.     ROS:  See HPI  Physical Exam:  Today's Vitals   10/24/20 1324  BP: 112/61  Pulse: 69  Resp: 20  Temp: 98.7 F (37.1 C)  TempSrc: Temporal  SpO2: 100%  Weight: 138 lb 12.8 oz (63 kg)  Height: 5' 5.5" (1.664 m)  PainSc: 8   PainLoc: Leg   Body mass index is 22.75 kg/m.   Incision:    When dressing was removed, murky serous fluid drained from wound, otherwise, wound is clean.  Midline and right groin clean and healing nicely.   Extremities:  Brisk doppler signals bilateral DP/PT; swelling of BLE with left>right (appears improved from hospital when I saw him a couple of weeks ago) Abdomen:  Soft, non tender, non distended    Assessment/Plan:  This is a 69 y.o. male who is s/p: aortobifemoral bypass grafting with reimplantation of the IMA, aortic endarterectomy and bilateral CFA endarterectomies on 10/03/2020 by Dr. Trula Slade with post operative course complicated by seroma with I&D of left groin on 10/17/2020 also by Dr. Trula Slade.   -pt with brisk doppler signals bilateral feet. -still with BLE swelling but improving.  Pt continues to elevate legs when not walking -left groin wound vac removed-pt tolerated well.  Initial gush of serous murky fluid that was cultured & order placed.   Otherwise, wound is clean.  Pt will continue to take his Keflex.  Wound vac dressing replaced as well new canister placed.  -will have pt return to see Dr. Trula Slade on 11/8 for wound check.   -discussed with Dr. Scot Dock and pt ok to have covid booster and flu shot.   Leontine Locket, [facility] [phone]  Clinic MD:  Scot Dock

## 2020-10-26 ENCOUNTER — Telehealth: Payer: Self-pay

## 2020-10-26 LAB — HOUSE ACCOUNT TRACKING

## 2020-10-26 NOTE — Telephone Encounter (Signed)
Spoke to [place] at [employer]. Confirmed the fax I sent yesterday regarding patient labs.

## 2020-10-29 ENCOUNTER — Encounter: Payer: Self-pay | Admitting: Surgery

## 2020-10-29 ENCOUNTER — Ambulatory Visit (INDEPENDENT_AMBULATORY_CARE_PROVIDER_SITE_OTHER): Payer: Self-pay | Admitting: Surgery

## 2020-10-29 ENCOUNTER — Other Ambulatory Visit: Payer: Self-pay

## 2020-10-29 VITALS — BP 119/63 | HR 67 | Temp 99.0°F | Resp 20 | Ht 65.5 in | Wt 135.0 lb

## 2020-10-29 DIAGNOSIS — Z95828 Presence of other vascular implants and grafts: Secondary | ICD-10-CM

## 2020-10-29 MED ORDER — SULFAMETHOXAZOLE-TRIMETHOPRIM 400-80 MG PO TABS: 1.0000 | ORAL_TABLET | Freq: Two times a day (BID) | ORAL | 0 refills | Status: DC

## 2020-10-29 NOTE — Progress Notes (Signed)
Patient name: Gregory [name] MRN: [id number] DOB: 1951/02/01 Sex: male  REASON FOR VISIT:     post op  HISTORY OF PRESENT ILLNESS:   Gregory [name] is a 69 y.o. male who is back for a wound check.  He had an aortobifemoral bypass graft and a seroma in the left groin that required operative drainage.  A wound VAC has been in place.  He has been on Keflex.  He feels that the drainage has decreased recently  CURRENT MEDICATIONS:    Current Outpatient Medications  Medication Sig Dispense Refill  . acetaminophen (TYLENOL) 325 MG tablet Take 2 tablets (650 mg total) by mouth every 6 (six) hours as needed for mild pain.    [name] albuterol (PROVENTIL HFA;VENTOLIN HFA) 108 (90 Base) MCG/ACT inhaler Inhale 2 puffs into the lungs every 6 (six) hours as needed for wheezing. 3 Inhaler 1  . amLODipine (NORVASC) 10 MG tablet TAKE ONE TABLET BY MOUTH DAILY (Patient taking differently: Take 10 mg by mouth daily. ) 90 tablet 0  . aspirin EC 325 MG EC tablet Take 1 tablet (325 mg total) by mouth daily. 30 tablet 0  . cephALEXin (KEFLEX) 500 MG capsule Take 1 capsule (500 mg total) by mouth 3 (three) times daily for 7 days. 21 capsule 0  . divalproex (DEPAKOTE) 500 MG DR tablet Take 1,000-1,500 mg by mouth at bedtime.     . docusate sodium (COLACE) 100 MG capsule Take 100 mg by mouth in the morning, at noon, and at bedtime. 2 in am 1 at hs    . finasteride (PROSCAR) 5 MG tablet TAKE ONE TABLET BY MOUTH DAILY (Patient taking differently: Take 5 mg by mouth daily. ) 90 tablet 1  . fluticasone (FLONASE) 50 MCG/ACT nasal spray SPRAY 2 SPRAYS INTO THE AFFECTED NOSTRIL DAILY (Patient taking differently: Place 2 sprays into both nostrils daily. SPRAY 2 SPRAYS INTO THE AFFECTED NOSTRIL DAILY) 48 mL 10  . Fluticasone-Salmeterol (ADVAIR DISKUS) 500-50 MCG/DOSE AEPB Inhale 1 puff into the lungs every 12 (twelve) hours. 180 each 3  . lamoTRIgine (LAMICTAL) 200 MG tablet Take 1 tablet  (200 mg total) by mouth every morning. 90 tablet 3  . lisinopril-hydrochlorothiazide (ZESTORETIC) 20-12.5 MG tablet TAKE ONE TABLET BY MOUTH EVERY MORNING (Patient taking differently: Take 1 tablet by mouth daily. ) 90 tablet 1  . Multiple Vitamins-Minerals (MULTIVITAMIN ADULTS 50+ PO) Take 1 tablet by mouth daily.     . traMADol (ULTRAM) 50 MG tablet Take 1 tablet (50 mg total) by mouth every 12 (twelve) hours as needed for moderate pain. 20 tablet 0  . atorvastatin (LIPITOR) 80 MG tablet Take 1 tablet (80 mg total) by mouth daily. 90 tablet 3   No current facility-administered medications for this visit.    REVIEW OF SYSTEMS:   [X]  denotes positive finding, [ ]  denotes negative finding Cardiac  Comments:  Chest pain or chest pressure:    Shortness of breath upon exertion:    Short of breath when lying flat:    Irregular heart rhythm:    Constitutional    Fever or chills:      PHYSICAL EXAM:   Vitals:   10/29/20 1506  BP: 119/63  Pulse: 67  Resp: 20  Temp: 99 F (37.2 C)  SpO2: 95%  Weight: 135 lb (61.2 kg)  Height: 5' 5.5" (1.664 m)    GENERAL: The patient is a well-nourished male, in no acute distress. The vital signs are documented above. CARDIOVASCULAR: There  is a regular rate and rhythm. PULMONARY: Non-labored respirations There is a residual fluid collection at the inferior aspect of the wound.  I probed this area with a Q-tip and was able to decompress it and got out more lymphatic fluid.  There is some erythema over top of this area.  The skin incision is almost completely healed.  STUDIES:   None   MEDICAL ISSUES:   I was able to evacuate the residual seroma with a Q-tip.  There is only a small skin opening remaining.  I suspect this is going to close up again but hopefully the area will not fill up with fluid.  I am changing him from Keflex to Bactrim.  If the fluid collection reaccumulates, he may require another operative exploration.  I will see him back  in a week  [name], IV, MD, FACS [facility] [phone] Pager [phone]

## 2020-11-01 ENCOUNTER — Telehealth: Payer: Self-pay

## 2020-11-01 NOTE — Telephone Encounter (Signed)
Make sure he keeps the feet elevated and wear support stockings

## 2020-11-01 NOTE — Telephone Encounter (Signed)
Beth with [employer]

## 2020-11-01 NOTE — Telephone Encounter (Signed)
Beth with [facility] called to let you know that the pt. Has 1 to 2 plus edema in both legs a little worse in the lt. Leg below the knee all the way down. She can be reached at [phone].

## 2020-11-02 ENCOUNTER — Telehealth: Payer: Self-pay

## 2020-11-02 ENCOUNTER — Other Ambulatory Visit: Payer: Self-pay | Admitting: Physician Assistant

## 2020-11-02 DIAGNOSIS — Z95828 Presence of other vascular implants and grafts: Secondary | ICD-10-CM

## 2020-11-02 MED ORDER — TRAMADOL HCL 50 MG PO TABS: 50.0000 mg | ORAL_TABLET | Freq: Two times a day (BID) | ORAL | 0 refills | Status: DC | PRN

## 2020-11-02 NOTE — Telephone Encounter (Signed)
Gregory [name] takes Tramadol when his dressing is changed because it hurts a lot.  He has 2 pills left and would like a few more to get him through the weekend until he sees Dr .Trula Slade on Monday, 11/05/20.

## 2020-11-02 NOTE — Telephone Encounter (Signed)
Gregory [name] is aware and will let the pt. Know.

## 2020-11-02 NOTE — Telephone Encounter (Signed)
Patient is aware a refill for Tramadol was sent to his pharmacy.  Gregory [name].E., LPN

## 2020-11-05 ENCOUNTER — Encounter: Payer: Self-pay | Admitting: Surgery

## 2020-11-05 ENCOUNTER — Ambulatory Visit (INDEPENDENT_AMBULATORY_CARE_PROVIDER_SITE_OTHER): Payer: PPO | Admitting: Surgery

## 2020-11-05 ENCOUNTER — Other Ambulatory Visit: Payer: Self-pay

## 2020-11-05 VITALS — BP 106/59 | HR 65 | Temp 98.4°F | Resp 20 | Ht 65.5 in | Wt 132.8 lb

## 2020-11-05 DIAGNOSIS — Z95828 Presence of other vascular implants and grafts: Secondary | ICD-10-CM

## 2020-11-05 NOTE — Progress Notes (Signed)
Patient name: Gregory [name] MRN: [id number] DOB: [birth year] Sex: male  REASON FOR VISIT:     post op wound check  HISTORY OF PRESENT ILLNESS:   Gregory [name] is a 69 y.o. male who is status post aortobifemoral bypass graft.  This was complicated by a left groin seroma which required operative drainage.  He is back today for a wound check.  He was started on Bactrim last week  CURRENT MEDICATIONS:    Current Outpatient Medications  Medication Sig Dispense Refill  . acetaminophen (TYLENOL) 325 MG tablet Take 2 tablets (650 mg total) by mouth every 6 (six) hours as needed for mild pain.    [name] albuterol (PROVENTIL HFA;VENTOLIN HFA) 108 (90 Base) MCG/ACT inhaler Inhale 2 puffs into the lungs every 6 (six) hours as needed for wheezing. 3 Inhaler 1  . amLODipine (NORVASC) 10 MG tablet TAKE ONE TABLET BY MOUTH DAILY (Patient taking differently: Take 10 mg by mouth daily. ) 90 tablet 0  . aspirin EC 325 MG EC tablet Take 1 tablet (325 mg total) by mouth daily. 30 tablet 0  . divalproex (DEPAKOTE) 500 MG DR tablet Take 1,000-1,500 mg by mouth at bedtime.     . docusate sodium (COLACE) 100 MG capsule Take 100 mg by mouth in the morning, at noon, and at bedtime. 2 in am 1 at hs    . finasteride (PROSCAR) 5 MG tablet TAKE ONE TABLET BY MOUTH DAILY (Patient taking differently: Take 5 mg by mouth daily. ) 90 tablet 1  . fluticasone (FLONASE) 50 MCG/ACT nasal spray SPRAY 2 SPRAYS INTO THE AFFECTED NOSTRIL DAILY (Patient taking differently: Place 2 sprays into both nostrils daily. SPRAY 2 SPRAYS INTO THE AFFECTED NOSTRIL DAILY) 48 mL 10  . Fluticasone-Salmeterol (ADVAIR DISKUS) 500-50 MCG/DOSE AEPB Inhale 1 puff into the lungs every 12 (twelve) hours. 180 each 3  . lamoTRIgine (LAMICTAL) 200 MG tablet Take 1 tablet (200 mg total) by mouth every morning. 90 tablet 3  . lisinopril-hydrochlorothiazide (ZESTORETIC) 20-12.5 MG tablet TAKE ONE TABLET BY MOUTH EVERY  MORNING (Patient taking differently: Take 1 tablet by mouth daily. ) 90 tablet 1  . Multiple Vitamins-Minerals (MULTIVITAMIN ADULTS 50+ PO) Take 1 tablet by mouth daily.     [name] sulfamethoxazole-trimethoprim (BACTRIM) 400-80 MG tablet Take 1 tablet by mouth 2 (two) times daily. 20 tablet 0  . traMADol (ULTRAM) 50 MG tablet Take 1 tablet (50 mg total) by mouth every 12 (twelve) hours as needed for moderate pain. 20 tablet 0  . atorvastatin (LIPITOR) 80 MG tablet Take 1 tablet (80 mg total) by mouth daily. 90 tablet 3   No current facility-administered medications for this visit.    REVIEW OF SYSTEMS:   [X]  denotes positive finding, [ ]  denotes negative finding Cardiac  Comments:  Chest pain or chest pressure:    Shortness of breath upon exertion:    Short of breath when lying flat:    Irregular heart rhythm:    Constitutional    Fever or chills:      PHYSICAL EXAM:   Vitals:   11/05/20 1226  BP: (!) 106/59  Pulse: 65  Resp: 20  Temp: 98.4 F (36.9 C)  SpO2: 98%  Weight: 132 lb 12.8 oz (60.2 kg)  Height: 5' 5.5" (1.664 m)    GENERAL: The patient is a well-nourished male, in no acute distress. The vital signs are documented above. CARDIOVASCULAR: There is a regular rate and rhythm. PULMONARY: Non-labored respirations The wound VAC  was removed today.  There is no longer a large bulge in the lower aspect of the incision.  The skin edges are coming together nicely.  There is no erythema.  I did evaluate the prior seroma site with ultrasound and did not see any significant fluid collection  STUDIES:   None   MEDICAL ISSUES:   Wound VAC was removed today.  The patient will do wet-to-dry dressing changes.  Home health will help assist with this.  He will follow-up with me in 3 weeks.  He knows to contact me with any changes in his wound.  Leia Alf, MD, FACS [facility] [phone] Pager [phone]

## 2020-11-06 DIAGNOSIS — K5900 Constipation, unspecified: Secondary | ICD-10-CM | POA: Diagnosis not present

## 2020-11-06 DIAGNOSIS — Z8701 Personal history of pneumonia (recurrent): Secondary | ICD-10-CM | POA: Diagnosis not present

## 2020-11-06 DIAGNOSIS — I119 Hypertensive heart disease without heart failure: Secondary | ICD-10-CM | POA: Diagnosis not present

## 2020-11-06 DIAGNOSIS — Z951 Presence of aortocoronary bypass graft: Secondary | ICD-10-CM | POA: Diagnosis not present

## 2020-11-06 DIAGNOSIS — Z79899 Other long term (current) drug therapy: Secondary | ICD-10-CM | POA: Diagnosis not present

## 2020-11-06 DIAGNOSIS — I97648 Postprocedural seroma of a circulatory system organ or structure following other circulatory system procedure: Secondary | ICD-10-CM | POA: Diagnosis not present

## 2020-11-06 DIAGNOSIS — F419 Anxiety disorder, unspecified: Secondary | ICD-10-CM | POA: Diagnosis not present

## 2020-11-06 DIAGNOSIS — G4733 Obstructive sleep apnea (adult) (pediatric): Secondary | ICD-10-CM | POA: Diagnosis not present

## 2020-11-06 DIAGNOSIS — F17210 Nicotine dependence, cigarettes, uncomplicated: Secondary | ICD-10-CM | POA: Diagnosis not present

## 2020-11-06 DIAGNOSIS — I2510 Atherosclerotic heart disease of native coronary artery without angina pectoris: Secondary | ICD-10-CM | POA: Diagnosis not present

## 2020-11-06 DIAGNOSIS — Z9181 History of falling: Secondary | ICD-10-CM | POA: Diagnosis not present

## 2020-11-06 DIAGNOSIS — M48061 Spinal stenosis, lumbar region without neurogenic claudication: Secondary | ICD-10-CM | POA: Diagnosis not present

## 2020-11-06 DIAGNOSIS — Z7951 Long term (current) use of inhaled steroids: Secondary | ICD-10-CM | POA: Diagnosis not present

## 2020-11-06 DIAGNOSIS — Z953 Presence of xenogenic heart valve: Secondary | ICD-10-CM | POA: Diagnosis not present

## 2020-11-06 DIAGNOSIS — F429 Obsessive-compulsive disorder, unspecified: Secondary | ICD-10-CM | POA: Diagnosis not present

## 2020-11-06 DIAGNOSIS — G629 Polyneuropathy, unspecified: Secondary | ICD-10-CM | POA: Diagnosis not present

## 2020-11-06 DIAGNOSIS — Z7982 Long term (current) use of aspirin: Secondary | ICD-10-CM | POA: Diagnosis not present

## 2020-11-06 DIAGNOSIS — G709 Myoneural disorder, unspecified: Secondary | ICD-10-CM | POA: Diagnosis not present

## 2020-11-06 DIAGNOSIS — I739 Peripheral vascular disease, unspecified: Secondary | ICD-10-CM | POA: Diagnosis not present

## 2020-11-06 DIAGNOSIS — I452 Bifascicular block: Secondary | ICD-10-CM | POA: Diagnosis not present

## 2020-11-06 DIAGNOSIS — Z981 Arthrodesis status: Secondary | ICD-10-CM | POA: Diagnosis not present

## 2020-11-06 DIAGNOSIS — F319 Bipolar disorder, unspecified: Secondary | ICD-10-CM | POA: Diagnosis not present

## 2020-11-06 DIAGNOSIS — E785 Hyperlipidemia, unspecified: Secondary | ICD-10-CM | POA: Diagnosis not present

## 2020-11-06 DIAGNOSIS — J4530 Mild persistent asthma, uncomplicated: Secondary | ICD-10-CM | POA: Diagnosis not present

## 2020-11-06 DIAGNOSIS — G8929 Other chronic pain: Secondary | ICD-10-CM | POA: Diagnosis not present

## 2020-11-07 ENCOUNTER — Telehealth: Payer: Self-pay

## 2020-11-07 NOTE — Telephone Encounter (Signed)
HH called -gave verbal order for wet to dry dressing changes -per MD note. HH will see 3x per week.

## 2020-11-08 ENCOUNTER — Telehealth: Payer: Self-pay

## 2020-11-08 NOTE — Telephone Encounter (Signed)
Patient called to report he was on his last day of Bactrim. He has finished his course and will f/u with Dr. Trula Slade as scheduled.

## 2020-11-09 ENCOUNTER — Telehealth: Payer: Self-pay

## 2020-11-09 ENCOUNTER — Other Ambulatory Visit: Payer: Self-pay | Admitting: Family Medicine

## 2020-11-09 DIAGNOSIS — R3914 Feeling of incomplete bladder emptying: Secondary | ICD-10-CM

## 2020-11-09 DIAGNOSIS — N401 Enlarged prostate with lower urinary tract symptoms: Secondary | ICD-10-CM

## 2020-11-09 DIAGNOSIS — I10 Essential (primary) hypertension: Secondary | ICD-10-CM

## 2020-11-09 NOTE — Telephone Encounter (Signed)
lvm advising pt it tis time to schedule a med check appt. Kh

## 2020-11-12 ENCOUNTER — Telehealth: Payer: Self-pay

## 2020-11-12 NOTE — Telephone Encounter (Signed)
Alvis Lemmings is requesting to extend home visit for 2 times a week for 2 weeks. She also advised that he was wheezing. Please advise [facility] [phone] beth. [place]

## 2020-11-13 ENCOUNTER — Telehealth: Payer: Self-pay

## 2020-11-13 NOTE — Telephone Encounter (Signed)
ok 

## 2020-11-13 NOTE — Telephone Encounter (Signed)
Patient called to request med refill for tramadol. Per [facility] nurse wound looks "no better, no worse". He is having pain with the swelling and with trying to move his leg. Sent refill request message.

## 2020-11-13 NOTE — Telephone Encounter (Signed)
Done and had to lvm. [place]

## 2020-11-14 ENCOUNTER — Other Ambulatory Visit: Payer: Self-pay | Admitting: Physician Assistant

## 2020-11-14 ENCOUNTER — Ambulatory Visit (INDEPENDENT_AMBULATORY_CARE_PROVIDER_SITE_OTHER): Payer: PPO | Admitting: Family Medicine

## 2020-11-14 ENCOUNTER — Other Ambulatory Visit: Payer: Self-pay

## 2020-11-14 ENCOUNTER — Telehealth: Payer: Self-pay

## 2020-11-14 ENCOUNTER — Encounter: Payer: Self-pay | Admitting: Family Medicine

## 2020-11-14 VITALS — BP 140/62 | HR 56 | Temp 97.2°F | Wt 132.0 lb

## 2020-11-14 DIAGNOSIS — I97648 Postprocedural seroma of a circulatory system organ or structure following other circulatory system procedure: Secondary | ICD-10-CM | POA: Diagnosis not present

## 2020-11-14 DIAGNOSIS — R609 Edema, unspecified: Secondary | ICD-10-CM | POA: Diagnosis not present

## 2020-11-14 DIAGNOSIS — Z95828 Presence of other vascular implants and grafts: Secondary | ICD-10-CM

## 2020-11-14 DIAGNOSIS — I10 Essential (primary) hypertension: Secondary | ICD-10-CM | POA: Diagnosis not present

## 2020-11-14 MED ORDER — TRAMADOL HCL 50 MG PO TABS: 50.0000 mg | ORAL_TABLET | Freq: Two times a day (BID) | ORAL | 0 refills | Status: DC | PRN

## 2020-11-14 NOTE — Progress Notes (Signed)
   Subjective:    Patient ID: Gregory [name], male    DOB: Jan 26, 1951, 69 y.o.   MRN: [id number]  HPI He is here for a med check appointment.  He recently had his lisinopril/HCTZ and amlodipine renewed.  He is having no difficulty with them.  He has had recent aortofemoral bypass procedure and subsequent difficulty with seroma.  He still is having some slight drainage from the left surgical wound.  There is question about his breathing however he has had no chest pain, shortness of breath, wheezing.   Review of Systems     Objective:   Physical Exam Alert and in no distress.  Lungs are clear to auscultation.  Cardiac exam shows regular rhythm without murmurs or gallops.  Exam of the inguinal area shows healing scar on the right.  The left surgical scar was covered with some bandaging.  Both areas do show some swelling approximately 3 to 4 cm in size.  Lower extremity exam does show 2-3+ pitting edema.       Assessment & Plan:  Postoperative seroma involving circulatory system after other circulatory system procedure  S/P aortobifemoral bypass surgery  Dependent edema  Primary hypertension He will follow up with vascular surgery concerning the healing and the seroma.  Continue on his blood pressure medications.  Encouraged him to continue to use his support stockings and elevate his feet as much as possible.

## 2020-11-14 NOTE — Telephone Encounter (Signed)
Verified with patient that refill of tramadol is at the pharmacy. Patient verbalizes understanding.

## 2020-11-14 NOTE — Progress Notes (Unsigned)
Pt continuing to undergo bid wet to dry dressing changes.  He is taking Tramadol with dressing changes.    PDMP reviewed and will give a 10 day supply until his next visit with Dr. Trula Slade on 12/6.   Leontine Locket, [facility] 11/14/2020 9:12 AM

## 2020-11-19 ENCOUNTER — Telehealth: Payer: Self-pay

## 2020-11-19 ENCOUNTER — Other Ambulatory Visit: Payer: Self-pay

## 2020-11-19 DIAGNOSIS — Z79899 Other long term (current) drug therapy: Secondary | ICD-10-CM | POA: Diagnosis not present

## 2020-11-19 DIAGNOSIS — Z981 Arthrodesis status: Secondary | ICD-10-CM | POA: Diagnosis not present

## 2020-11-19 DIAGNOSIS — F429 Obsessive-compulsive disorder, unspecified: Secondary | ICD-10-CM | POA: Diagnosis not present

## 2020-11-19 DIAGNOSIS — Z951 Presence of aortocoronary bypass graft: Secondary | ICD-10-CM | POA: Diagnosis not present

## 2020-11-19 DIAGNOSIS — E785 Hyperlipidemia, unspecified: Secondary | ICD-10-CM | POA: Diagnosis not present

## 2020-11-19 DIAGNOSIS — Z7951 Long term (current) use of inhaled steroids: Secondary | ICD-10-CM | POA: Diagnosis not present

## 2020-11-19 DIAGNOSIS — N401 Enlarged prostate with lower urinary tract symptoms: Secondary | ICD-10-CM

## 2020-11-19 DIAGNOSIS — F319 Bipolar disorder, unspecified: Secondary | ICD-10-CM | POA: Diagnosis not present

## 2020-11-19 DIAGNOSIS — K5900 Constipation, unspecified: Secondary | ICD-10-CM | POA: Diagnosis not present

## 2020-11-19 DIAGNOSIS — G8929 Other chronic pain: Secondary | ICD-10-CM | POA: Diagnosis not present

## 2020-11-19 DIAGNOSIS — G4733 Obstructive sleep apnea (adult) (pediatric): Secondary | ICD-10-CM | POA: Diagnosis not present

## 2020-11-19 DIAGNOSIS — M48061 Spinal stenosis, lumbar region without neurogenic claudication: Secondary | ICD-10-CM | POA: Diagnosis not present

## 2020-11-19 DIAGNOSIS — Z9181 History of falling: Secondary | ICD-10-CM | POA: Diagnosis not present

## 2020-11-19 DIAGNOSIS — I739 Peripheral vascular disease, unspecified: Secondary | ICD-10-CM | POA: Diagnosis not present

## 2020-11-19 DIAGNOSIS — I119 Hypertensive heart disease without heart failure: Secondary | ICD-10-CM | POA: Diagnosis not present

## 2020-11-19 DIAGNOSIS — Z7982 Long term (current) use of aspirin: Secondary | ICD-10-CM | POA: Diagnosis not present

## 2020-11-19 DIAGNOSIS — F419 Anxiety disorder, unspecified: Secondary | ICD-10-CM | POA: Diagnosis not present

## 2020-11-19 DIAGNOSIS — I10 Essential (primary) hypertension: Secondary | ICD-10-CM

## 2020-11-19 DIAGNOSIS — I452 Bifascicular block: Secondary | ICD-10-CM | POA: Diagnosis not present

## 2020-11-19 DIAGNOSIS — J4530 Mild persistent asthma, uncomplicated: Secondary | ICD-10-CM | POA: Diagnosis not present

## 2020-11-19 DIAGNOSIS — I2510 Atherosclerotic heart disease of native coronary artery without angina pectoris: Secondary | ICD-10-CM | POA: Diagnosis not present

## 2020-11-19 DIAGNOSIS — G709 Myoneural disorder, unspecified: Secondary | ICD-10-CM | POA: Diagnosis not present

## 2020-11-19 DIAGNOSIS — G629 Polyneuropathy, unspecified: Secondary | ICD-10-CM | POA: Diagnosis not present

## 2020-11-19 DIAGNOSIS — Z953 Presence of xenogenic heart valve: Secondary | ICD-10-CM | POA: Diagnosis not present

## 2020-11-19 DIAGNOSIS — R3914 Feeling of incomplete bladder emptying: Secondary | ICD-10-CM

## 2020-11-19 DIAGNOSIS — F17210 Nicotine dependence, cigarettes, uncomplicated: Secondary | ICD-10-CM | POA: Diagnosis not present

## 2020-11-19 DIAGNOSIS — Z8701 Personal history of pneumonia (recurrent): Secondary | ICD-10-CM | POA: Diagnosis not present

## 2020-11-19 DIAGNOSIS — I97648 Postprocedural seroma of a circulatory system organ or structure following other circulatory system procedure: Secondary | ICD-10-CM | POA: Diagnosis not present

## 2020-11-19 MED ORDER — AMLODIPINE BESYLATE 10 MG PO TABS: 10.0000 mg | ORAL_TABLET | Freq: Every day | ORAL | 0 refills | Status: DC

## 2020-11-19 MED ORDER — FINASTERIDE 5 MG PO TABS: 5.0000 mg | ORAL_TABLET | Freq: Every day | ORAL | 0 refills | Status: DC

## 2020-11-19 NOTE — Telephone Encounter (Signed)
Received fax for a refill on the pts. amlodpine and finasteride for a 90 day supply pt. Last apt was 11/14/20.

## 2020-11-19 NOTE — Telephone Encounter (Signed)
Done [facility] 

## 2020-11-22 ENCOUNTER — Other Ambulatory Visit: Payer: Self-pay | Admitting: Cardiology

## 2020-11-22 DIAGNOSIS — Z8701 Personal history of pneumonia (recurrent): Secondary | ICD-10-CM | POA: Diagnosis not present

## 2020-11-22 DIAGNOSIS — F17210 Nicotine dependence, cigarettes, uncomplicated: Secondary | ICD-10-CM | POA: Diagnosis not present

## 2020-11-22 DIAGNOSIS — I2510 Atherosclerotic heart disease of native coronary artery without angina pectoris: Secondary | ICD-10-CM | POA: Diagnosis not present

## 2020-11-22 DIAGNOSIS — I97648 Postprocedural seroma of a circulatory system organ or structure following other circulatory system procedure: Secondary | ICD-10-CM | POA: Diagnosis not present

## 2020-11-22 DIAGNOSIS — F429 Obsessive-compulsive disorder, unspecified: Secondary | ICD-10-CM | POA: Diagnosis not present

## 2020-11-22 DIAGNOSIS — E785 Hyperlipidemia, unspecified: Secondary | ICD-10-CM | POA: Diagnosis not present

## 2020-11-22 DIAGNOSIS — K5900 Constipation, unspecified: Secondary | ICD-10-CM | POA: Diagnosis not present

## 2020-11-22 DIAGNOSIS — Z9181 History of falling: Secondary | ICD-10-CM | POA: Diagnosis not present

## 2020-11-22 DIAGNOSIS — Z7982 Long term (current) use of aspirin: Secondary | ICD-10-CM | POA: Diagnosis not present

## 2020-11-22 DIAGNOSIS — F319 Bipolar disorder, unspecified: Secondary | ICD-10-CM | POA: Diagnosis not present

## 2020-11-22 DIAGNOSIS — I119 Hypertensive heart disease without heart failure: Secondary | ICD-10-CM | POA: Diagnosis not present

## 2020-11-22 DIAGNOSIS — G8929 Other chronic pain: Secondary | ICD-10-CM | POA: Diagnosis not present

## 2020-11-22 DIAGNOSIS — Z953 Presence of xenogenic heart valve: Secondary | ICD-10-CM | POA: Diagnosis not present

## 2020-11-22 DIAGNOSIS — F419 Anxiety disorder, unspecified: Secondary | ICD-10-CM | POA: Diagnosis not present

## 2020-11-22 DIAGNOSIS — Z7951 Long term (current) use of inhaled steroids: Secondary | ICD-10-CM | POA: Diagnosis not present

## 2020-11-22 DIAGNOSIS — Z79899 Other long term (current) drug therapy: Secondary | ICD-10-CM | POA: Diagnosis not present

## 2020-11-22 DIAGNOSIS — Z951 Presence of aortocoronary bypass graft: Secondary | ICD-10-CM | POA: Diagnosis not present

## 2020-11-22 DIAGNOSIS — M48061 Spinal stenosis, lumbar region without neurogenic claudication: Secondary | ICD-10-CM | POA: Diagnosis not present

## 2020-11-22 DIAGNOSIS — Z981 Arthrodesis status: Secondary | ICD-10-CM | POA: Diagnosis not present

## 2020-11-22 DIAGNOSIS — I452 Bifascicular block: Secondary | ICD-10-CM | POA: Diagnosis not present

## 2020-11-22 DIAGNOSIS — I739 Peripheral vascular disease, unspecified: Secondary | ICD-10-CM | POA: Diagnosis not present

## 2020-11-22 DIAGNOSIS — G709 Myoneural disorder, unspecified: Secondary | ICD-10-CM | POA: Diagnosis not present

## 2020-11-22 DIAGNOSIS — G629 Polyneuropathy, unspecified: Secondary | ICD-10-CM | POA: Diagnosis not present

## 2020-11-22 DIAGNOSIS — J4530 Mild persistent asthma, uncomplicated: Secondary | ICD-10-CM | POA: Diagnosis not present

## 2020-11-22 DIAGNOSIS — G4733 Obstructive sleep apnea (adult) (pediatric): Secondary | ICD-10-CM | POA: Diagnosis not present

## 2020-11-26 ENCOUNTER — Ambulatory Visit (INDEPENDENT_AMBULATORY_CARE_PROVIDER_SITE_OTHER): Payer: Self-pay | Admitting: Surgery

## 2020-11-26 ENCOUNTER — Other Ambulatory Visit: Payer: Self-pay

## 2020-11-26 ENCOUNTER — Encounter: Payer: Self-pay | Admitting: Surgery

## 2020-11-26 VITALS — BP 136/67 | HR 57 | Temp 98.3°F | Resp 20 | Ht 65.5 in | Wt 131.5 lb

## 2020-11-26 DIAGNOSIS — I70213 Atherosclerosis of native arteries of extremities with intermittent claudication, bilateral legs: Secondary | ICD-10-CM

## 2020-11-26 NOTE — Progress Notes (Signed)
[facility] [place]  Patient name: Gregory [name] MRN: [id number] DOB: [birth year] Sex: male   REASON FOR VISIT:    Follow up  HISOTRY OF PRESENT ILLNESS:    Gregory [name] is a 69 y.o. male who is status post aortobifemoral bypass on 10-03-2020.  He went back to the OR for operative drainage of a left groin seroma.  He has been receiving dressing changes.  He will occasionally take a tramadol.  He is able to walk a mile and a half this weekend  Patient has a greater than 100-pack-year history of smoking.  He has cut down to half pack per day.  He is medically managed for hypertension.  He takes a statin for hypercholesterolemia.  Patient is status post CABG/AVR in 2020 PAST MEDICAL HISTORY:   Past Medical History:  Diagnosis Date  . Alcohol abuse   . Allergy   . Aortic stenosis   . Arthritis   . Asthma   . Bipolar disorder ([place])   . CAD (coronary artery disease)    coronary calcifications 08/2013 CTA  . Carotid artery occlusion   . Chronic back pain   . Claudication ([place])   . Depression   . Family history of skin cancer   . Heart murmur   . Hepatitis    h/o 1970,doesn't remember which type,1990 epstain-barr  . Hyperlipidemia   . Hypertension   . Neuromuscular disorder ([place])   . Obsessive compulsive disorder   . PAD (peripheral artery disease) ([place])   . Peripheral neuropathy   . Pneumonia   . RBBB   . 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 Severe aortic stenosis   . Sleep apnea    does not wear CPAP  . Smoker   . Spinal stenosis at L4-L5 level   . Tobacco abuse   . Tobacco use disorder      FAMILY HISTORY:   Family History  Problem Relation Age of Onset  . Stroke Father 32  . Cancer Father        skin  . Drug abuse Father   . Hypertension Brother   . Diabetes Brother   . Drug abuse Brother   . Heart disease Brother        s/p CABG, pacemaker  . Heart  failure Mother   . Cancer Maternal Aunt        skin  . Cancer Maternal Grandmother        liver    SOCIAL HISTORY:   Social History   Tobacco Use  . Smoking status: Current Every Day Smoker    Packs/day: 0.50    Years: 25.00    Pack years: 12.50    Types: Cigarettes  . Smokeless tobacco: Never Used  . Tobacco comment: Pt had stopped smoking X10 years/ currently smoking a pack a day   Substance Use Topics  . 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 Codeine Anaphylaxis     CURRENT MEDICATIONS:   Current Outpatient Medications  Medication Sig Dispense Refill  . acetaminophen (TYLENOL) 325 MG tablet Take 2 tablets (650 mg total) by mouth every 6 (six) hours as needed for mild pain.    [name] albuterol (PROVENTIL HFA;VENTOLIN HFA) 108 (90 Base) MCG/ACT inhaler Inhale 2 puffs into the lungs every 6 (six) hours as needed for wheezing. 3 Inhaler 1  .  amLODipine (NORVASC) 10 MG tablet Take 1 tablet (10 mg total) by mouth daily. 90 tablet 0  . aspirin EC 325 MG EC tablet Take 1 tablet (325 mg total) by mouth daily. 30 tablet 0  . atorvastatin (LIPITOR) 80 MG tablet TAKE ONE TABLET BY MOUTH DAILY 90 tablet 3  . divalproex (DEPAKOTE) 500 MG DR tablet Take 1,000-1,500 mg by mouth at bedtime.     . docusate sodium (COLACE) 100 MG capsule Take 100 mg by mouth in the morning, at noon, and at bedtime. 2 in am 1 at hs    . finasteride (PROSCAR) 5 MG tablet Take 1 tablet (5 mg total) by mouth daily. 90 tablet 0  . fluticasone (FLONASE) 50 MCG/ACT nasal spray SPRAY 2 SPRAYS INTO THE AFFECTED NOSTRIL DAILY (Patient taking differently: Place 2 sprays into both nostrils daily. SPRAY 2 SPRAYS INTO THE AFFECTED NOSTRIL DAILY) 48 mL 10  . Fluticasone-Salmeterol (ADVAIR DISKUS) 500-50 MCG/DOSE AEPB Inhale 1 puff into the lungs every 12 (twelve) hours. 180 each 3  . lamoTRIgine (LAMICTAL) 200 MG tablet Take 1 tablet (200 mg total)  by mouth every morning. 90 tablet 3  . lisinopril-hydrochlorothiazide (ZESTORETIC) 20-12.5 MG tablet TAKE ONE TABLET BY MOUTH EVERY MORNING (Patient taking differently: Take 1 tablet by mouth daily. ) 90 tablet 1  . Multiple Vitamins-Minerals (MULTIVITAMIN ADULTS 50+ PO) Take 1 tablet by mouth daily.     . polyethylene glycol (MIRALAX) 17 g packet Take 17 g by mouth daily.    [name] sulfamethoxazole-trimethoprim (BACTRIM) 400-80 MG tablet Take 1 tablet by mouth 2 (two) times daily. 20 tablet 0  . traMADol (ULTRAM) 50 MG tablet Take 1 tablet (50 mg total) by mouth every 12 (twelve) hours as needed for moderate pain. 20 tablet 0   No current facility-administered medications for this visit.    REVIEW OF SYSTEMS:   [X]  denotes positive finding, [ ]  denotes negative finding Cardiac  Comments:  Chest pain or chest pressure:    Shortness of breath upon exertion:    Short of breath when lying flat:    Irregular heart rhythm:        Vascular    Pain in calf, thigh, or hip brought on by ambulation:    Pain in feet at night that wakes you up from your sleep:     Blood clot in your veins:    Leg swelling:         Pulmonary    Oxygen at home:    Productive cough:     Wheezing:         Neurologic    Sudden weakness in arms or legs:     Sudden numbness in arms or legs:     Sudden onset of difficulty speaking or slurred speech:    Temporary loss of vision in one eye:     Problems with dizziness:         Gastrointestinal    Blood in stool:     Vomited blood:         Genitourinary    Burning when urinating:     Blood in urine:        Psychiatric    Major depression:         Hematologic    Bleeding problems:    Problems with blood clotting too easily:        Skin    Rashes or ulcers:        Constitutional    Fever or  chills:      PHYSICAL EXAM:   Vitals:   11/26/20 0929  BP: 136/67  Pulse: (!) 57  Resp: 20  Temp: 98.3 F (36.8 C)  SpO2: 98%  Weight: 131 lb 8 oz (59.6 kg)   Height: 5' 5.5" (1.664 m)    GENERAL: The patient is a well-nourished male, in no acute distress. The vital signs are documented above. CARDIAC: There is a regular rate and rhythm.  VASCULAR: Palpable femoral pulses bilaterally.  The midline incision and right groin incision have completely healed.  The left incision has approximately a 1 mm superficial defect that did not track with a Q-tip.  It was touched with silver nitrate PULMONARY: Non-labored respirations ABDOMEN: Soft and non-tender with normal pitched bowel sounds.  MUSCULOSKELETAL: There are no major deformities or cyanosis. NEUROLOGIC: No focal weakness or paresthesias are detected. SKIN: There are no ulcers or rashes noted. PSYCHIATRIC: The patient has a normal affect.  STUDIES:   none  MEDICAL ISSUES:   The patient's left groin has almost completely healed.  There is a pencil as superficial defect that does not track with a Q-tip.  Test this with silver nitrate.  Hopefully this will go on to completely heal.  I will check back in with him in 1 mont    [name], IV, MD, FACS [facility] [phone] Pager [phone]

## 2020-11-28 DIAGNOSIS — F319 Bipolar disorder, unspecified: Secondary | ICD-10-CM | POA: Diagnosis not present

## 2020-11-28 DIAGNOSIS — I739 Peripheral vascular disease, unspecified: Secondary | ICD-10-CM | POA: Diagnosis not present

## 2020-11-28 DIAGNOSIS — G8929 Other chronic pain: Secondary | ICD-10-CM | POA: Diagnosis not present

## 2020-11-28 DIAGNOSIS — Z951 Presence of aortocoronary bypass graft: Secondary | ICD-10-CM | POA: Diagnosis not present

## 2020-11-28 DIAGNOSIS — I97648 Postprocedural seroma of a circulatory system organ or structure following other circulatory system procedure: Secondary | ICD-10-CM | POA: Diagnosis not present

## 2020-11-28 DIAGNOSIS — K5900 Constipation, unspecified: Secondary | ICD-10-CM | POA: Diagnosis not present

## 2020-11-28 DIAGNOSIS — I452 Bifascicular block: Secondary | ICD-10-CM | POA: Diagnosis not present

## 2020-11-28 DIAGNOSIS — G4733 Obstructive sleep apnea (adult) (pediatric): Secondary | ICD-10-CM | POA: Diagnosis not present

## 2020-11-28 DIAGNOSIS — E785 Hyperlipidemia, unspecified: Secondary | ICD-10-CM | POA: Diagnosis not present

## 2020-11-28 DIAGNOSIS — M48061 Spinal stenosis, lumbar region without neurogenic claudication: Secondary | ICD-10-CM | POA: Diagnosis not present

## 2020-11-28 DIAGNOSIS — G629 Polyneuropathy, unspecified: Secondary | ICD-10-CM | POA: Diagnosis not present

## 2020-11-28 DIAGNOSIS — I2510 Atherosclerotic heart disease of native coronary artery without angina pectoris: Secondary | ICD-10-CM | POA: Diagnosis not present

## 2020-11-28 DIAGNOSIS — G709 Myoneural disorder, unspecified: Secondary | ICD-10-CM | POA: Diagnosis not present

## 2020-11-28 DIAGNOSIS — Z7951 Long term (current) use of inhaled steroids: Secondary | ICD-10-CM | POA: Diagnosis not present

## 2020-11-28 DIAGNOSIS — J4530 Mild persistent asthma, uncomplicated: Secondary | ICD-10-CM | POA: Diagnosis not present

## 2020-11-28 DIAGNOSIS — Z7982 Long term (current) use of aspirin: Secondary | ICD-10-CM | POA: Diagnosis not present

## 2020-11-28 DIAGNOSIS — Z953 Presence of xenogenic heart valve: Secondary | ICD-10-CM | POA: Diagnosis not present

## 2020-11-28 DIAGNOSIS — Z8701 Personal history of pneumonia (recurrent): Secondary | ICD-10-CM | POA: Diagnosis not present

## 2020-11-28 DIAGNOSIS — F17210 Nicotine dependence, cigarettes, uncomplicated: Secondary | ICD-10-CM | POA: Diagnosis not present

## 2020-11-28 DIAGNOSIS — Z9181 History of falling: Secondary | ICD-10-CM | POA: Diagnosis not present

## 2020-11-28 DIAGNOSIS — F429 Obsessive-compulsive disorder, unspecified: Secondary | ICD-10-CM | POA: Diagnosis not present

## 2020-11-28 DIAGNOSIS — F419 Anxiety disorder, unspecified: Secondary | ICD-10-CM | POA: Diagnosis not present

## 2020-11-28 DIAGNOSIS — Z79899 Other long term (current) drug therapy: Secondary | ICD-10-CM | POA: Diagnosis not present

## 2020-11-28 DIAGNOSIS — Z981 Arthrodesis status: Secondary | ICD-10-CM | POA: Diagnosis not present

## 2020-11-28 DIAGNOSIS — I119 Hypertensive heart disease without heart failure: Secondary | ICD-10-CM | POA: Diagnosis not present

## 2020-11-29 ENCOUNTER — Other Ambulatory Visit: Payer: Self-pay | Admitting: Physician Assistant

## 2020-11-29 ENCOUNTER — Telehealth: Payer: Self-pay

## 2020-11-29 DIAGNOSIS — Z95828 Presence of other vascular implants and grafts: Secondary | ICD-10-CM

## 2020-11-29 MED ORDER — TRAMADOL HCL 50 MG PO TABS: 50.0000 mg | ORAL_TABLET | Freq: Four times a day (QID) | ORAL | 0 refills | Status: DC | PRN

## 2020-11-29 NOTE — Telephone Encounter (Signed)
Patient called to request tramadol refill. Wound probed by Dr. Trula Slade on Monday and HH the day after - having some pain related to that. Called in # 5 - patient aware.

## 2020-12-06 ENCOUNTER — Telehealth: Payer: Self-pay

## 2020-12-06 NOTE — Telephone Encounter (Signed)
Patient left VM about billing questions. Returned call and left VM with billing phone numbers. Explained our office has nothing to do with billing, but the billing office should be able to help.

## 2020-12-11 ENCOUNTER — Other Ambulatory Visit: Payer: Self-pay | Admitting: Family Medicine

## 2020-12-11 DIAGNOSIS — I10 Essential (primary) hypertension: Secondary | ICD-10-CM

## 2020-12-19 ENCOUNTER — Telehealth: Payer: Self-pay

## 2020-12-19 NOTE — Telephone Encounter (Signed)
Patient called to report a knot in the groin incision. It has been present since Monday. It is red and the size of a "middle fingernail." It has a "dot on the top that might be a pimple, was white and has now turned black." Says he is experiencing discomfort. The wound has stopped seeping but per patient it has started to seep clear pinkish fluid again over the past 2-3 days. Denies fever, chills, and color/odor from incision area. Patient has had difficulty with healing this site - placed on PA schedule for wound check tomorrow.

## 2020-12-20 ENCOUNTER — Telehealth: Payer: Self-pay

## 2020-12-20 ENCOUNTER — Other Ambulatory Visit: Payer: Self-pay

## 2020-12-20 ENCOUNTER — Encounter: Payer: Self-pay | Admitting: Physician Assistant

## 2020-12-20 ENCOUNTER — Ambulatory Visit (INDEPENDENT_AMBULATORY_CARE_PROVIDER_SITE_OTHER): Payer: Self-pay | Admitting: Physician Assistant

## 2020-12-20 ENCOUNTER — Other Ambulatory Visit: Payer: Self-pay | Admitting: Surgery

## 2020-12-20 VITALS — BP 133/70 | HR 54 | Temp 98.7°F | Resp 20 | Ht 65.5 in | Wt 134.4 lb

## 2020-12-20 DIAGNOSIS — T8189XD Other complications of procedures, not elsewhere classified, subsequent encounter: Secondary | ICD-10-CM | POA: Diagnosis not present

## 2020-12-20 DIAGNOSIS — Z95828 Presence of other vascular implants and grafts: Secondary | ICD-10-CM

## 2020-12-20 MED ORDER — LEVOFLOXACIN 500 MG PO TABS: 500.0000 mg | ORAL_TABLET | Freq: Every day | ORAL | 0 refills | Status: DC

## 2020-12-20 NOTE — Addendum Note (Signed)
Addended byWaynetta Pean on: 12/20/2020 11:24 AM   Modules accepted: Orders

## 2020-12-20 NOTE — Progress Notes (Signed)
POST OPERATIVE OFFICE NOTE    CC:  F/u for surgery  HPI: Gregory [name] is a 69 y.o. male who is status post aortobifemoral bypass on 10-03-2020.  He went back to the OR for operative drainage of a left groin seroma.   Pt returns today for follow up secondary to "red knot " on the incision area.  He was seen by Dr. Trula Slade on 11/26/2020.  At that time he left incision has approximately a 1 mm superficial defect that did not track with a Q-tip.  It was touched with silver nitrate.  He called to report enlarged, red area over the left groin incision with drainage since Christmas day.    He denise fever and chills.   Allergies  Allergen Reactions  . Codeine Anaphylaxis    Current Outpatient Medications  Medication Sig Dispense Refill  . acetaminophen (TYLENOL) 325 MG tablet Take 2 tablets (650 mg total) by mouth every 6 (six) hours as needed for mild pain.    [name] albuterol (PROVENTIL HFA;VENTOLIN HFA) 108 (90 Base) MCG/ACT inhaler Inhale 2 puffs into the lungs every 6 (six) hours as needed for wheezing. 3 Inhaler 1  . amLODipine (NORVASC) 10 MG tablet TAKE ONE TABLET BY MOUTH DAILY 90 tablet 0  . aspirin EC 325 MG EC tablet Take 1 tablet (325 mg total) by mouth daily. 30 tablet 0  . atorvastatin (LIPITOR) 80 MG tablet TAKE ONE TABLET BY MOUTH DAILY 90 tablet 3  . divalproex (DEPAKOTE) 500 MG DR tablet Take 1,000-1,500 mg by mouth at bedtime.     . docusate sodium (COLACE) 100 MG capsule Take 100 mg by mouth in the morning, at noon, and at bedtime. 2 in am 1 at hs    . finasteride (PROSCAR) 5 MG tablet Take 1 tablet (5 mg total) by mouth daily. 90 tablet 0  . fluticasone (FLONASE) 50 MCG/ACT nasal spray SPRAY 2 SPRAYS INTO THE AFFECTED NOSTRIL DAILY (Patient taking differently: Place 2 sprays into both nostrils daily. SPRAY 2 SPRAYS INTO THE AFFECTED NOSTRIL DAILY) 48 mL 10  . Fluticasone-Salmeterol (ADVAIR DISKUS) 500-50 MCG/DOSE AEPB Inhale 1 puff into the lungs every 12 (twelve) hours.  180 each 3  . lamoTRIgine (LAMICTAL) 200 MG tablet Take 1 tablet (200 mg total) by mouth every morning. 90 tablet 3  . lisinopril-hydrochlorothiazide (ZESTORETIC) 20-12.5 MG tablet TAKE ONE TABLET BY MOUTH EVERY MORNING 90 tablet 0  . Multiple Vitamins-Minerals (MULTIVITAMIN ADULTS 50+ PO) Take 1 tablet by mouth daily.     . polyethylene glycol (MIRALAX) 17 g packet Take 17 g by mouth daily.    [name] sulfamethoxazole-trimethoprim (BACTRIM) 400-80 MG tablet Take 1 tablet by mouth 2 (two) times daily. 20 tablet 0  . traMADol (ULTRAM) 50 MG tablet Take 1 tablet (50 mg total) by mouth every 12 (twelve) hours as needed for moderate pain. 20 tablet 0  . traMADol (ULTRAM) 50 MG tablet Take 1 tablet (50 mg total) by mouth every 6 (six) hours as needed. 5 tablet 0   No current facility-administered medications for this visit.     ROS:  See HPI  Physical Exam:  Vitals:   12/20/20 0917  BP: 133/70  Pulse: (!) 54  Resp: 20  Temp: 98.7 F (37.1 C)  SpO2: 99%       Incision:  Healing well except the central area.  Expressed pus from area. Palpable femoral pulse.   Culture taken and sent to lab core.      Assessment/Plan:  This is a 69 y.o. male who is s/p:69 y.o. male who is status post aortobifemoral bypass on 10-03-2020.  He went back to the OR for operative drainage of a left groin seroma.   On/Off drainage left groin without erythema today, but reported red and raised 2 days ago. I sent Levofloxacin for 14 days po.  He had some greenish drainage on the dressing that was removed today.  He has been on Cipro in the past.     Cont. dressing changes as needed. He will f/u in 2 weeks with Dr. Myra Gianotti.    Mosetta Pigeon PA-C [facility] [phone]  Clinic MD:  Darrick Penna

## 2020-12-20 NOTE — Telephone Encounter (Signed)
Received fax from pharmacy about drug interaction between pletal and levofloxacin - prolonged QT interval. Confirmed with patient he is no longer taking pletal. Pharmacy aware.

## 2020-12-20 NOTE — Telephone Encounter (Signed)
I spoke with Amina with [facility]. I scheduled a in-office pick for pts wound cx. I also requested a call back with results due to having trouble receiving culture results, She informed me that I would have to call back and request results and she could not assist with putting in a call back with results. Pick up reference number is [phone].

## 2020-12-22 HISTORY — PX: CATARACT EXTRACTION, BILATERAL: SHX1313

## 2020-12-24 LAB — WOUND CULTURE
MICRO NUMBER:: 11370607
RESULT:: NO GROWTH
SPECIMEN QUALITY:: ADEQUATE

## 2020-12-24 LAB — HOUSE ACCOUNT TRACKING

## 2020-12-31 ENCOUNTER — Encounter: Payer: Self-pay | Admitting: Surgery

## 2020-12-31 ENCOUNTER — Other Ambulatory Visit: Payer: Self-pay

## 2020-12-31 ENCOUNTER — Other Ambulatory Visit (HOSPITAL_COMMUNITY)
Admission: RE | Admit: 2020-12-31 | Discharge: 2020-12-31 | Disposition: A | Discharge status: Home / Self Care | Payer: PPO | Source: Ambulatory Visit | Attending: Surgery | Admitting: Surgery

## 2020-12-31 ENCOUNTER — Ambulatory Visit (INDEPENDENT_AMBULATORY_CARE_PROVIDER_SITE_OTHER): Payer: Self-pay | Admitting: Surgery

## 2020-12-31 VITALS — BP 133/69 | HR 55 | Temp 98.4°F | Resp 20 | Ht 65.5 in | Wt 137.4 lb

## 2020-12-31 DIAGNOSIS — Z01812 Encounter for preprocedural laboratory examination: Secondary | ICD-10-CM | POA: Insufficient documentation

## 2020-12-31 DIAGNOSIS — Z20822 Contact with and (suspected) exposure to covid-19: Secondary | ICD-10-CM | POA: Diagnosis not present

## 2020-12-31 DIAGNOSIS — I70213 Atherosclerosis of native arteries of extremities with intermittent claudication, bilateral legs: Secondary | ICD-10-CM

## 2020-12-31 LAB — SARS CORONAVIRUS 2 (TAT 6-24 HRS): SARS Coronavirus 2: NEGATIVE

## 2020-12-31 NOTE — Progress Notes (Signed)
[facility] [place]  Patient name: Gregory [name] MRN: [id number] DOB: 06-01-1951 Sex: male   REASON FOR VISIT:    Follow up  HISOTRY OF PRESENT ILLNESS:   Gregory [name] a 70 y.o.male, who wasreferredby Dr. Leonard Downing evaluation of surgical treatment for bilateral claudication. CTA revealed infrarenal aortic occlusion.  On 10/21/2020 he underwent aortobifemoral bypass graft using a 14 x 7 dacryon graft as well as reimplantation of his inferior mesenteric artery.  He developed a large seroma in his left groin and went back to the operating room on 10/17/2020 for I&D of the left groin.  Patient has a greater than 100-pack-year history of smoking. He has cut down to half pack per day. He is medically managed for hypertension. He takes a statin for hypercholesterolemia. Patient is status post CABG/AVR in 2020.  He has had no prior abdominal surgeries.  PAST MEDICAL HISTORY:   Past Medical History:  Diagnosis Date  . Alcohol abuse   . Allergy   . Aortic stenosis   . Arthritis   . Asthma   . Bipolar disorder ([place])   . CAD (coronary artery disease)    coronary calcifications 08/2013 CTA  . Carotid artery occlusion   . Chronic back pain   . Claudication ([place])   . Depression   . Family history of skin cancer   . Heart murmur   . Hepatitis    h/o 1970,doesn't remember which type,1990 epstain-barr  . Hyperlipidemia   . Hypertension   . Neuromuscular disorder ([place])   . Obsessive compulsive disorder   . PAD (peripheral artery disease) ([place])   . Peripheral neuropathy   . Pneumonia   . RBBB   . 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 Severe aortic stenosis   . Sleep apnea    does not wear CPAP  . Smoker   . Spinal stenosis at L4-L5 level   . Tobacco abuse   . Tobacco use disorder      FAMILY HISTORY:   Family History  Problem Relation Age of Onset  . Stroke Father  60  . Cancer Father        skin  . Drug abuse Father   . Hypertension Brother   . Diabetes Brother   . Drug abuse Brother   . Heart disease Brother        s/p CABG, pacemaker  . Heart failure Mother   . Cancer Maternal Aunt        skin  . Cancer Maternal Grandmother        liver    SOCIAL HISTORY:   Social History   Tobacco Use  . Smoking status: Current Every Day Smoker    Packs/day: 0.50    Years: 25.00    Pack years: 12.50    Types: Cigarettes  . Smokeless tobacco: Never Used  . Tobacco comment: Pt had stopped smoking X10 years/ currently smoking a pack a day   Substance Use Topics  . 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 Codeine Anaphylaxis     CURRENT MEDICATIONS:   Current Outpatient Medications  Medication Sig Dispense Refill  . acetaminophen (TYLENOL) 325 MG tablet Take 2 tablets (650 mg total) by mouth every 6 (six) hours as needed for mild pain.    [name] albuterol (PROVENTIL HFA;VENTOLIN HFA) 108 (90 Base) MCG/ACT inhaler  Inhale 2 puffs into the lungs every 6 (six) hours as needed for wheezing. 3 Inhaler 1  . amLODipine (NORVASC) 10 MG tablet TAKE ONE TABLET BY MOUTH DAILY 90 tablet 0  . aspirin EC 325 MG EC tablet Take 1 tablet (325 mg total) by mouth daily. 30 tablet 0  . atorvastatin (LIPITOR) 80 MG tablet TAKE ONE TABLET BY MOUTH DAILY 90 tablet 3  . divalproex (DEPAKOTE) 500 MG DR tablet Take 1,000-1,500 mg by mouth at bedtime.     . docusate sodium (COLACE) 100 MG capsule Take 100 mg by mouth in the morning, at noon, and at bedtime. 2 in am 1 at hs    . finasteride (PROSCAR) 5 MG tablet Take 1 tablet (5 mg total) by mouth daily. 90 tablet 0  . fluticasone (FLONASE) 50 MCG/ACT nasal spray SPRAY 2 SPRAYS INTO THE AFFECTED NOSTRIL DAILY (Patient taking differently: Place 2 sprays into both nostrils daily. SPRAY 2 SPRAYS INTO THE AFFECTED NOSTRIL DAILY) 48 mL 10  .  Fluticasone-Salmeterol (ADVAIR DISKUS) 500-50 MCG/DOSE AEPB Inhale 1 puff into the lungs every 12 (twelve) hours. 180 each 3  . lamoTRIgine (LAMICTAL) 200 MG tablet Take 1 tablet (200 mg total) by mouth every morning. 90 tablet 3  . levofloxacin (LEVAQUIN) 500 MG tablet Take 1 tablet (500 mg total) by mouth daily. 14 tablet 0  . lisinopril-hydrochlorothiazide (ZESTORETIC) 20-12.5 MG tablet TAKE ONE TABLET BY MOUTH EVERY MORNING 90 tablet 0  . Multiple Vitamins-Minerals (MULTIVITAMIN ADULTS 50+ PO) Take 1 tablet by mouth daily.     . polyethylene glycol (MIRALAX / GLYCOLAX) 17 g packet Take 17 g by mouth daily.    . traMADol (ULTRAM) 50 MG tablet Take 1 tablet (50 mg total) by mouth every 6 (six) hours as needed. 5 tablet 0   No current facility-administered medications for this visit.    REVIEW OF SYSTEMS:   [X]  denotes positive finding, [ ]  denotes negative finding Cardiac  Comments:  Chest pain or chest pressure:    Shortness of breath upon exertion:    Short of breath when lying flat:    Irregular heart rhythm:        Vascular    Pain in calf, thigh, or hip brought on by ambulation:    Pain in feet at night that wakes you up from your sleep:     Blood clot in your veins:    Leg swelling:         Pulmonary    Oxygen at home:    Productive cough:     Wheezing:         Neurologic    Sudden weakness in arms or legs:     Sudden numbness in arms or legs:     Sudden onset of difficulty speaking or slurred speech:    Temporary loss of vision in one eye:     Problems with dizziness:         Gastrointestinal    Blood in stool:     Vomited blood:         Genitourinary    Burning when urinating:     Blood in urine:        Psychiatric    Major depression:         Hematologic    Bleeding problems:    Problems with blood clotting too easily:        Skin    Rashes or ulcers:        Constitutional  Fever or chills:      PHYSICAL EXAM:   Vitals:   12/31/20 1102  BP:  133/69  Pulse: (!) 55  Resp: 20  Temp: 98.4 F (36.9 C)  SpO2: 99%  Weight: 137 lb 6.4 oz (62.3 kg)  Height: 5' 5.5" (1.664 m)    GENERAL: The patient is a well-nourished male, in no acute distress. The vital signs are documented above. CARDIAC: There is a regular rate and rhythm.  VASCULAR: The left groin incision has discolored drainage.  I was able to express some dirty appearing fluid.  He did not tolerate deep probing. PULMONARY: Non-labored respirations ABDOMEN: Soft and non-tender.  Midline incision is well-healed without hernia MUSCULOSKELETAL: There are no major deformities or cyanosis. NEUROLOGIC: No focal weakness or paresthesias are detected. SKIN: There are no ulcers or rashes noted. PSYCHIATRIC: The patient has a normal affect.  STUDIES:   None  MEDICAL ISSUES:   Persistent drainage from left groin: We have been addressing this for several months and has not healed.  Discussed that we probably need to go back to the operating room for further exploration.  This will be scheduled for Wednesday, January 12    Leia Alf, MD, FACS [facility] [phone] Pager [phone]

## 2020-12-31 NOTE — H&P (View-Only) (Signed)
[facility] [place]  Patient name: Gregory [name] MRN: [id number] DOB: 06-01-1951 Sex: male   REASON FOR VISIT:    Follow up  HISOTRY OF PRESENT ILLNESS:   Gregory [name] a 70 y.o.male, who wasreferredby Dr. Leonard Downing evaluation of surgical treatment for bilateral claudication. CTA revealed infrarenal aortic occlusion.  On 10/21/2020 he underwent aortobifemoral bypass graft using a 14 x 7 dacryon graft as well as reimplantation of his inferior mesenteric artery.  He developed a large seroma in his left groin and went back to the operating room on 10/17/2020 for I&D of the left groin.  Patient has a greater than 100-pack-year history of smoking. He has cut down to half pack per day. He is medically managed for hypertension. He takes a statin for hypercholesterolemia. Patient is status post CABG/AVR in 2020.  He has had no prior abdominal surgeries.  PAST MEDICAL HISTORY:   Past Medical History:  Diagnosis Date  . Alcohol abuse   . Allergy   . Aortic stenosis   . Arthritis   . Asthma   . Bipolar disorder ([place])   . CAD (coronary artery disease)    coronary calcifications 08/2013 CTA  . Carotid artery occlusion   . Chronic back pain   . Claudication ([place])   . Depression   . Family history of skin cancer   . Heart murmur   . Hepatitis    h/o 1970,doesn't remember which type,1990 epstain-barr  . Hyperlipidemia   . Hypertension   . Neuromuscular disorder ([place])   . Obsessive compulsive disorder   . PAD (peripheral artery disease) ([place])   . Peripheral neuropathy   . Pneumonia   . RBBB   . 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 Severe aortic stenosis   . Sleep apnea    does not wear CPAP  . Smoker   . Spinal stenosis at L4-L5 level   . Tobacco abuse   . Tobacco use disorder      FAMILY HISTORY:   Family History  Problem Relation Age of Onset  . Stroke Father  60  . Cancer Father        skin  . Drug abuse Father   . Hypertension Brother   . Diabetes Brother   . Drug abuse Brother   . Heart disease Brother        s/p CABG, pacemaker  . Heart failure Mother   . Cancer Maternal Aunt        skin  . Cancer Maternal Grandmother        liver    SOCIAL HISTORY:   Social History   Tobacco Use  . Smoking status: Current Every Day Smoker    Packs/day: 0.50    Years: 25.00    Pack years: 12.50    Types: Cigarettes  . Smokeless tobacco: Never Used  . Tobacco comment: Pt had stopped smoking X10 years/ currently smoking a pack a day   Substance Use Topics  . 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 Codeine Anaphylaxis     CURRENT MEDICATIONS:   Current Outpatient Medications  Medication Sig Dispense Refill  . acetaminophen (TYLENOL) 325 MG tablet Take 2 tablets (650 mg total) by mouth every 6 (six) hours as needed for mild pain.    [name] albuterol (PROVENTIL HFA;VENTOLIN HFA) 108 (90 Base) MCG/ACT inhaler  Inhale 2 puffs into the lungs every 6 (six) hours as needed for wheezing. 3 Inhaler 1  . amLODipine (NORVASC) 10 MG tablet TAKE ONE TABLET BY MOUTH DAILY 90 tablet 0  . aspirin EC 325 MG EC tablet Take 1 tablet (325 mg total) by mouth daily. 30 tablet 0  . atorvastatin (LIPITOR) 80 MG tablet TAKE ONE TABLET BY MOUTH DAILY 90 tablet 3  . divalproex (DEPAKOTE) 500 MG DR tablet Take 1,000-1,500 mg by mouth at bedtime.     . docusate sodium (COLACE) 100 MG capsule Take 100 mg by mouth in the morning, at noon, and at bedtime. 2 in am 1 at hs    . finasteride (PROSCAR) 5 MG tablet Take 1 tablet (5 mg total) by mouth daily. 90 tablet 0  . fluticasone (FLONASE) 50 MCG/ACT nasal spray SPRAY 2 SPRAYS INTO THE AFFECTED NOSTRIL DAILY (Patient taking differently: Place 2 sprays into both nostrils daily. SPRAY 2 SPRAYS INTO THE AFFECTED NOSTRIL DAILY) 48 mL 10  .  Fluticasone-Salmeterol (ADVAIR DISKUS) 500-50 MCG/DOSE AEPB Inhale 1 puff into the lungs every 12 (twelve) hours. 180 each 3  . lamoTRIgine (LAMICTAL) 200 MG tablet Take 1 tablet (200 mg total) by mouth every morning. 90 tablet 3  . levofloxacin (LEVAQUIN) 500 MG tablet Take 1 tablet (500 mg total) by mouth daily. 14 tablet 0  . lisinopril-hydrochlorothiazide (ZESTORETIC) 20-12.5 MG tablet TAKE ONE TABLET BY MOUTH EVERY MORNING 90 tablet 0  . Multiple Vitamins-Minerals (MULTIVITAMIN ADULTS 50+ PO) Take 1 tablet by mouth daily.     . polyethylene glycol (MIRALAX / GLYCOLAX) 17 g packet Take 17 g by mouth daily.    . traMADol (ULTRAM) 50 MG tablet Take 1 tablet (50 mg total) by mouth every 6 (six) hours as needed. 5 tablet 0   No current facility-administered medications for this visit.    REVIEW OF SYSTEMS:   [X]  denotes positive finding, [ ]  denotes negative finding Cardiac  Comments:  Chest pain or chest pressure:    Shortness of breath upon exertion:    Short of breath when lying flat:    Irregular heart rhythm:        Vascular    Pain in calf, thigh, or hip brought on by ambulation:    Pain in feet at night that wakes you up from your sleep:     Blood clot in your veins:    Leg swelling:         Pulmonary    Oxygen at home:    Productive cough:     Wheezing:         Neurologic    Sudden weakness in arms or legs:     Sudden numbness in arms or legs:     Sudden onset of difficulty speaking or slurred speech:    Temporary loss of vision in one eye:     Problems with dizziness:         Gastrointestinal    Blood in stool:     Vomited blood:         Genitourinary    Burning when urinating:     Blood in urine:        Psychiatric    Major depression:         Hematologic    Bleeding problems:    Problems with blood clotting too easily:        Skin    Rashes or ulcers:        Constitutional  Fever or chills:      PHYSICAL EXAM:   Vitals:   12/31/20 1102  BP:  133/69  Pulse: (!) 55  Resp: 20  Temp: 98.4 F (36.9 C)  SpO2: 99%  Weight: 137 lb 6.4 oz (62.3 kg)  Height: 5' 5.5" (1.664 m)    GENERAL: The patient is a well-nourished male, in no acute distress. The vital signs are documented above. CARDIAC: There is a regular rate and rhythm.  VASCULAR: The left groin incision has discolored drainage.  I was able to express some dirty appearing fluid.  He did not tolerate deep probing. PULMONARY: Non-labored respirations ABDOMEN: Soft and non-tender.  Midline incision is well-healed without hernia MUSCULOSKELETAL: There are no major deformities or cyanosis. NEUROLOGIC: No focal weakness or paresthesias are detected. SKIN: There are no ulcers or rashes noted. PSYCHIATRIC: The patient has a normal affect.  STUDIES:   None  MEDICAL ISSUES:   Persistent drainage from left groin: We have been addressing this for several months and has not healed.  Discussed that we probably need to go back to the operating room for further exploration.  This will be scheduled for Wednesday, January 12    Leia Alf, MD, FACS [facility] [phone] Pager [phone]

## 2021-01-01 ENCOUNTER — Encounter (HOSPITAL_COMMUNITY): Payer: Self-pay | Admitting: Surgery

## 2021-01-01 ENCOUNTER — Other Ambulatory Visit: Payer: Self-pay

## 2021-01-01 NOTE — Progress Notes (Addendum)
Mr. Towry denies chest pain or shortness of breath.  Patient tested negative for Covid 12/31/20 and has been in quarantine since that time.  Mr. Stoklosa reports that when he had surgery 10/16/20, that he had a panic attack when the mask was placed. "I do not think I was given any  Prior to mask being placed, I want to make sure I have something to calm me down this time."  I listed this information under anesthesia complications.

## 2021-01-02 ENCOUNTER — Ambulatory Visit (HOSPITAL_COMMUNITY): Payer: PPO | Admitting: Certified Registered"

## 2021-01-02 ENCOUNTER — Ambulatory Visit (HOSPITAL_COMMUNITY)
Admission: RE | Admit: 2021-01-02 | Discharge: 2021-01-02 | Disposition: A | Discharge status: Home / Self Care | Payer: PPO | Attending: Surgery | Admitting: Surgery

## 2021-01-02 ENCOUNTER — Encounter (HOSPITAL_COMMUNITY): Payer: Self-pay | Admitting: Surgery

## 2021-01-02 ENCOUNTER — Encounter (HOSPITAL_COMMUNITY)
Admission: RE | Disposition: A | Discharge status: Home / Self Care | Payer: Self-pay | Source: Home / Self Care | Attending: Surgery

## 2021-01-02 ENCOUNTER — Other Ambulatory Visit: Payer: Self-pay

## 2021-01-02 DIAGNOSIS — Z952 Presence of prosthetic heart valve: Secondary | ICD-10-CM | POA: Insufficient documentation

## 2021-01-02 DIAGNOSIS — Z7951 Long term (current) use of inhaled steroids: Secondary | ICD-10-CM | POA: Insufficient documentation

## 2021-01-02 DIAGNOSIS — Z951 Presence of aortocoronary bypass graft: Secondary | ICD-10-CM | POA: Diagnosis not present

## 2021-01-02 DIAGNOSIS — E7800 Pure hypercholesterolemia, unspecified: Secondary | ICD-10-CM | POA: Insufficient documentation

## 2021-01-02 DIAGNOSIS — I10 Essential (primary) hypertension: Secondary | ICD-10-CM | POA: Insufficient documentation

## 2021-01-02 DIAGNOSIS — F17210 Nicotine dependence, cigarettes, uncomplicated: Secondary | ICD-10-CM | POA: Insufficient documentation

## 2021-01-02 DIAGNOSIS — Z95828 Presence of other vascular implants and grafts: Secondary | ICD-10-CM

## 2021-01-02 DIAGNOSIS — I2510 Atherosclerotic heart disease of native coronary artery without angina pectoris: Secondary | ICD-10-CM | POA: Diagnosis not present

## 2021-01-02 DIAGNOSIS — Z79899 Other long term (current) drug therapy: Secondary | ICD-10-CM | POA: Diagnosis not present

## 2021-01-02 DIAGNOSIS — L988 Other specified disorders of the skin and subcutaneous tissue: Secondary | ICD-10-CM | POA: Diagnosis not present

## 2021-01-02 DIAGNOSIS — J4530 Mild persistent asthma, uncomplicated: Secondary | ICD-10-CM | POA: Diagnosis not present

## 2021-01-02 DIAGNOSIS — Z7982 Long term (current) use of aspirin: Secondary | ICD-10-CM | POA: Diagnosis not present

## 2021-01-02 DIAGNOSIS — I97622 Postprocedural seroma of a circulatory system organ or structure following other procedure: Secondary | ICD-10-CM | POA: Diagnosis not present

## 2021-01-02 DIAGNOSIS — S31109A Unspecified open wound of abdominal wall, unspecified quadrant without penetration into peritoneal cavity, initial encounter: Secondary | ICD-10-CM | POA: Diagnosis not present

## 2021-01-02 HISTORY — PX: INCISION AND DRAINAGE: SHX5863

## 2021-01-02 LAB — BASIC METABOLIC PANEL
Anion gap: 13 (ref 5–15)
BUN: 15 mg/dL (ref 8–23)
CO2: 22 mmol/L (ref 22–32)
Calcium: 9.1 mg/dL (ref 8.9–10.3)
Chloride: 101 mmol/L (ref 98–111)
Creatinine, Ser: 0.76 mg/dL (ref 0.61–1.24)
GFR, Estimated: >60 mL/min (ref >60)
Glucose, Bld: 82 mg/dL (ref 70–99)
Potassium: 4.2 mmol/L (ref 3.5–5.1)
Sodium: 136 mmol/L (ref 135–145)

## 2021-01-02 LAB — CBC
HCT: 39.9 % (ref 39.0–52.0)
Hemoglobin: 13 g/dL (ref 13.0–17.0)
MCH: 28.7 pg (ref 26.0–34.0)
MCHC: 32.6 g/dL (ref 30.0–36.0)
MCV: 88.1 fL (ref 80.0–100.0)
Platelets: 200 10*3/uL (ref 150–400)
RBC: 4.53 MIL/uL (ref 4.22–5.81)
RDW: 15.7 % — ABNORMAL HIGH (ref 11.5–15.5)
WBC: 5.9 10*3/uL (ref 4.0–10.5)
nRBC: 0 % (ref 0.0–0.2)

## 2021-01-02 SURGERY — INCISION AND DRAINAGE
Anesthesia: General | Site: Groin | Laterality: Left

## 2021-01-02 MED ORDER — LACTATED RINGERS IV SOLN: INTRAVENOUS | Status: DC | PRN

## 2021-01-02 MED ORDER — CHLORHEXIDINE GLUCONATE 4 % EX LIQD: 60.0000 mL | Freq: Once | CUTANEOUS | Status: DC

## 2021-01-02 MED ORDER — FENTANYL CITRATE (PF) 100 MCG/2ML IJ SOLN: 25.0000 ug | INTRAMUSCULAR | Status: DC | PRN

## 2021-01-02 MED ORDER — PROPOFOL 10 MG/ML IV BOLUS
INTRAVENOUS | Status: DC | PRN
Start: 2021-01-02 — End: 2021-01-02
  Administered 2021-01-02: 170 mg via INTRAVENOUS

## 2021-01-02 MED ORDER — MIDAZOLAM HCL 2 MG/2ML IJ SOLN
INTRAMUSCULAR | Status: AC
  Filled 2021-01-02: qty 2

## 2021-01-02 MED ORDER — PHENYLEPHRINE HCL (PRESSORS) 10 MG/ML IV SOLN
INTRAVENOUS | Status: DC | PRN
  Administered 2021-01-02: 80 ug via INTRAVENOUS
  Administered 2021-01-02: 160 ug via INTRAVENOUS
  Administered 2021-01-02 (×2): 80 ug via INTRAVENOUS

## 2021-01-02 MED ORDER — DEXAMETHASONE SODIUM PHOSPHATE 10 MG/ML IJ SOLN
INTRAMUSCULAR | Status: AC
  Filled 2021-01-02: qty 1

## 2021-01-02 MED ORDER — 0.9 % SODIUM CHLORIDE (POUR BTL) OPTIME
TOPICAL | Status: DC | PRN
  Administered 2021-01-02: 1000 mL

## 2021-01-02 MED ORDER — TRAMADOL HCL 50 MG PO TABS: 50.0000 mg | ORAL_TABLET | Freq: Four times a day (QID) | ORAL | 0 refills | Status: DC | PRN

## 2021-01-02 MED ORDER — LIDOCAINE 2% (20 MG/ML) 5 ML SYRINGE
INTRAMUSCULAR | Status: AC
  Filled 2021-01-02: qty 5

## 2021-01-02 MED ORDER — PHENYLEPHRINE 40 MCG/ML (10ML) SYRINGE FOR IV PUSH (FOR BLOOD PRESSURE SUPPORT)
PREFILLED_SYRINGE | INTRAVENOUS | Status: AC
  Filled 2021-01-02: qty 10

## 2021-01-02 MED ORDER — GENTAMICIN SULFATE 40 MG/ML IJ SOLN
INTRAMUSCULAR | Status: AC
  Filled 2021-01-02: qty 2

## 2021-01-02 MED ORDER — ORAL CARE MOUTH RINSE: 15.0000 mL | Freq: Once | OROMUCOSAL | Status: AC

## 2021-01-02 MED ORDER — CEFAZOLIN SODIUM-DEXTROSE 2-4 GM/100ML-% IV SOLN
2.0000 g | INTRAVENOUS | Status: AC
  Administered 2021-01-02: 2 g via INTRAVENOUS
  Filled 2021-01-02: qty 100

## 2021-01-02 MED ORDER — ACETAMINOPHEN 10 MG/ML IV SOLN
INTRAVENOUS | Status: DC | PRN
  Administered 2021-01-02: 1000 mg via INTRAVENOUS

## 2021-01-02 MED ORDER — MIDAZOLAM HCL 2 MG/2ML IJ SOLN
INTRAMUSCULAR | Status: DC | PRN
  Administered 2021-01-02: 2 mg via INTRAVENOUS

## 2021-01-02 MED ORDER — ONDANSETRON HCL 4 MG/2ML IJ SOLN
INTRAMUSCULAR | Status: AC
  Filled 2021-01-02: qty 2

## 2021-01-02 MED ORDER — VANCOMYCIN HCL 1000 MG IV SOLR
INTRAVENOUS | Status: DC | PRN
  Administered 2021-01-02: 1000 mg

## 2021-01-02 MED ORDER — GENTAMICIN SULFATE 40 MG/ML IJ SOLN
INTRAMUSCULAR | Status: DC | PRN
  Administered 2021-01-02: 80 mg

## 2021-01-02 MED ORDER — GLYCOPYRROLATE 0.2 MG/ML IJ SOLN
INTRAMUSCULAR | Status: DC | PRN
  Administered 2021-01-02: .2 mg via INTRAVENOUS

## 2021-01-02 MED ORDER — SUFENTANIL CITRATE 50 MCG/ML IV SOLN
INTRAVENOUS | Status: DC | PRN
  Administered 2021-01-02: 10 ug via INTRAVENOUS

## 2021-01-02 MED ORDER — CHLORHEXIDINE GLUCONATE 0.12 % MT SOLN
15.0000 mL | Freq: Once | OROMUCOSAL | Status: AC
  Administered 2021-01-02: 15 mL via OROMUCOSAL
  Filled 2021-01-02: qty 15

## 2021-01-02 MED ORDER — GLYCOPYRROLATE PF 0.2 MG/ML IJ SOSY
PREFILLED_SYRINGE | INTRAMUSCULAR | Status: AC
  Filled 2021-01-02: qty 1

## 2021-01-02 MED ORDER — PROPOFOL 10 MG/ML IV BOLUS
INTRAVENOUS | Status: AC
  Filled 2021-01-02: qty 20

## 2021-01-02 MED ORDER — SUFENTANIL CITRATE 50 MCG/ML IV SOLN
INTRAVENOUS | Status: AC
  Filled 2021-01-02: qty 1

## 2021-01-02 MED ORDER — DEXAMETHASONE SODIUM PHOSPHATE 10 MG/ML IJ SOLN
INTRAMUSCULAR | Status: DC | PRN
  Administered 2021-01-02: 10 mg via INTRAVENOUS

## 2021-01-02 MED ORDER — SODIUM CHLORIDE 0.9 % IV SOLN: INTRAVENOUS | Status: DC

## 2021-01-02 MED ORDER — LIDOCAINE HCL (CARDIAC) PF 100 MG/5ML IV SOSY
PREFILLED_SYRINGE | INTRAVENOUS | Status: DC | PRN
  Administered 2021-01-02: 100 mg via INTRAVENOUS

## 2021-01-02 MED ORDER — ONDANSETRON HCL 4 MG/2ML IJ SOLN
INTRAMUSCULAR | Status: DC | PRN
  Administered 2021-01-02: 4 mg via INTRAVENOUS

## 2021-01-02 MED ORDER — LACTATED RINGERS IV SOLN: INTRAVENOUS | Status: DC

## 2021-01-02 MED ORDER — SODIUM CHLORIDE (PF) 0.9 % IJ SOLN
INTRAMUSCULAR | Status: AC
  Filled 2021-01-02: qty 10

## 2021-01-02 MED ORDER — VANCOMYCIN HCL 1000 MG IV SOLR
INTRAVENOUS | Status: AC
  Filled 2021-01-02: qty 1000

## 2021-01-02 SURGICAL SUPPLY — 49 items
BNDG ELASTIC 4X5.8 VLCR STR LF (GAUZE/BANDAGES/DRESSINGS) IMPLANT
BNDG ELASTIC 6X5.8 VLCR STR LF (GAUZE/BANDAGES/DRESSINGS) IMPLANT
BNDG GAUZE ELAST 4 BULKY (GAUZE/BANDAGES/DRESSINGS) IMPLANT
CANISTER SUCT 3000ML PPV (MISCELLANEOUS) ×2 IMPLANT
CLIP VESOCCLUDE MED 6/CT (CLIP) ×2 IMPLANT
CLIP VESOCCLUDE SM WIDE 6/CT (CLIP) ×2 IMPLANT
COVER SURGICAL LIGHT HANDLE (MISCELLANEOUS) ×2 IMPLANT
COVER WAND RF STERILE (DRAPES) IMPLANT
DRAPE HALF SHEET 40X57 (DRAPES) IMPLANT
DRAPE INCISE IOBAN 66X45 STRL (DRAPES) ×2 IMPLANT
DRAPE U-SHAPE 76X120 STRL (DRAPES) IMPLANT
DRESSING PREVENA PLUS CUSTOM (GAUZE/BANDAGES/DRESSINGS) ×1 IMPLANT
DRSG PREVENA PLUS CUSTOM (GAUZE/BANDAGES/DRESSINGS) ×2
ELECT REM PT RETURN 9FT ADLT (ELECTROSURGICAL) ×2
ELECTRODE REM PT RTRN 9FT ADLT (ELECTROSURGICAL) ×1 IMPLANT
GAUZE SPONGE 4X4 12PLY STRL (GAUZE/BANDAGES/DRESSINGS) IMPLANT
GAUZE XEROFORM 5X9 LF (GAUZE/BANDAGES/DRESSINGS) IMPLANT
GLOVE BIO SURGEON STRL SZ7 (GLOVE) ×2 IMPLANT
GLOVE BIOGEL PI IND STRL 7.5 (GLOVE) ×2 IMPLANT
GLOVE BIOGEL PI INDICATOR 7.5 (GLOVE) ×2
GLOVE ECLIPSE 7.0 STRL STRAW (GLOVE) ×2 IMPLANT
GLOVE SURG SS PI 6.5 STRL IVOR (GLOVE) ×2 IMPLANT
GLOVE SURG SS PI 7.5 STRL IVOR (GLOVE) ×2 IMPLANT
GLOVE SURG UNDER POLY LF SZ7.5 (GLOVE) ×4 IMPLANT
GOWN STRL REUS W/ TWL LRG LVL3 (GOWN DISPOSABLE) ×3 IMPLANT
GOWN STRL REUS W/ TWL XL LVL3 (GOWN DISPOSABLE) ×1 IMPLANT
GOWN STRL REUS W/TWL LRG LVL3 (GOWN DISPOSABLE) ×3
GOWN STRL REUS W/TWL XL LVL3 (GOWN DISPOSABLE) ×1
HANDPIECE INTERPULSE COAX TIP (DISPOSABLE)
IV NS IRRIG 3000ML ARTHROMATIC (IV SOLUTION) ×2 IMPLANT
KIT BASIN OR (CUSTOM PROCEDURE TRAY) ×2 IMPLANT
KIT STIMULAN 5CC (Orthopedic Implant) ×2 IMPLANT
KIT TURNOVER KIT B (KITS) ×2 IMPLANT
NS IRRIG 1000ML POUR BTL (IV SOLUTION) ×2 IMPLANT
PACK CV ACCESS (CUSTOM PROCEDURE TRAY) IMPLANT
PACK GENERAL/GYN (CUSTOM PROCEDURE TRAY) ×2 IMPLANT
PACK UNIVERSAL I (CUSTOM PROCEDURE TRAY) ×2 IMPLANT
PAD ARMBOARD 7.5X6 YLW CONV (MISCELLANEOUS) ×4 IMPLANT
PULSAVAC PLUS IRRIG FAN TIP (DISPOSABLE)
SET HNDPC FAN SPRY TIP SCT (DISPOSABLE) IMPLANT
SUT ETHILON 3 0 PS 1 (SUTURE) ×4 IMPLANT
SUT VIC AB 2-0 CTX 36 (SUTURE) IMPLANT
SUT VIC AB 3-0 SH 27 (SUTURE) ×4
SUT VIC AB 3-0 SH 27X BRD (SUTURE) ×4 IMPLANT
SUT VIC AB 3-0 X1 27 (SUTURE) ×2 IMPLANT
SUT VICRYL 4-0 PS2 18IN ABS (SUTURE) IMPLANT
TIP FAN IRRIG PULSAVAC PLUS (DISPOSABLE) IMPLANT
TOWEL GREEN STERILE (TOWEL DISPOSABLE) ×2 IMPLANT
WATER STERILE IRR 1000ML POUR (IV SOLUTION) ×2 IMPLANT

## 2021-01-02 NOTE — Progress Notes (Signed)
Labs sent to main lab via tube station at 1116.

## 2021-01-02 NOTE — Anesthesia Preprocedure Evaluation (Signed)
Anesthesia Evaluation  Patient identified by MRN, date of birth, ID band Patient awake    Reviewed: Allergy & Precautions, NPO status , Patient's Chart, lab work & pertinent test results  Airway Mallampati: II  TM Distance: >3 FB     Dental   Pulmonary asthma , sleep apnea , pneumonia, Current Smoker,    breath sounds clear to auscultation       Cardiovascular hypertension, + CAD and + Peripheral Vascular Disease  + dysrhythmias  Rhythm:Regular Rate:Normal     Neuro/Psych PSYCHIATRIC DISORDERS Anxiety Depression Bipolar Disorder  Neuromuscular disease    GI/Hepatic negative GI ROS, (+) Hepatitis -  Endo/Other  negative endocrine ROS  Renal/GU negative Renal ROS     Musculoskeletal  (+) Arthritis ,   Abdominal   Peds  Hematology   Anesthesia Other Findings   Reproductive/Obstetrics                             Anesthesia Physical Anesthesia Plan  ASA: III  Anesthesia Plan: General   Post-op Pain Management:    Induction: Intravenous  PONV Risk Score and Plan: 2 and Ondansetron, Dexamethasone and Midazolam  Airway Management Planned: LMA  Additional Equipment:   Intra-op Plan:   Post-operative Plan:   Informed Consent: I have reviewed the patients History and Physical, chart, labs and discussed the procedure including the risks, benefits and alternatives for the proposed anesthesia with the patient or authorized representative who has indicated his/her understanding and acceptance.     Dental advisory given  Plan Discussed with: CRNA and Anesthesiologist  Anesthesia Plan Comments:         Anesthesia Quick Evaluation

## 2021-01-02 NOTE — Interval H&P Note (Signed)
History and Physical Interval Note:  01/02/2021 12:20 PM  [name]  has presented today for surgery, with the diagnosis of Non healing left groin wound.  The various methods of treatment have been discussed with the patient and family. After consideration of risks, benefits and other options for treatment, the patient has consented to  Procedure(s): INCISION AND DRAINAGE LEFT GROIN (Left) as a surgical intervention.  The patient's history has been reviewed, patient examined, no change in status, stable for surgery.  I have reviewed the patient's chart and labs.  Questions were answered to the patient's satisfaction.     Gregory [name]

## 2021-01-02 NOTE — Anesthesia Postprocedure Evaluation (Signed)
Anesthesia Post Note  Patient: Gregory [name]  Procedure(s) Performed: INCISION AND DRAINAGE LEFT GROIN WITH STIMULAN BEADS (Left Groin)     Patient location during evaluation: PACU Anesthesia Type: General Level of consciousness: awake Pain management: pain level controlled Vital Signs Assessment: post-procedure vital signs reviewed and stable Respiratory status: spontaneous breathing Cardiovascular status: stable Postop Assessment: no apparent nausea or vomiting Anesthetic complications: no   No complications documented.  Last Vitals:  Vitals:   01/02/21 1455 01/02/21 1510  BP: (!) 127/59 122/61  Pulse: (!) 50 (!) 50  Resp: 12 13  Temp:  36.5 C  SpO2: 98% 98%    Last Pain:  Vitals:   01/02/21 1510  TempSrc:   PainSc: 0-No pain                 Zale Marcotte

## 2021-01-02 NOTE — Transfer of Care (Signed)
Immediate Anesthesia Transfer of Care Note  Patient: ELMAR ANTIGUA  Procedure(s) Performed: INCISION AND DRAINAGE LEFT GROIN WITH STIMULAN BEADS (Left Groin)  Patient Location: PACU  Anesthesia Type:General  Level of Consciousness: awake, drowsy, patient cooperative and responds to stimulation  Airway & Oxygen Therapy: Patient Spontanous Breathing and Patient connected to nasal cannula oxygen  Post-op Assessment: Report given to RN, Post -op Vital signs reviewed and stable and Patient moving all extremities X 4  Post vital signs: Reviewed and stable  Last Vitals:  Vitals Value Taken Time  BP 117/59 01/02/21 1440  Temp    Pulse 51 01/02/21 1441  Resp 12 01/02/21 1441  SpO2 100 % 01/02/21 1441  Vitals shown include unvalidated device data.  Last Pain:  Vitals:   01/02/21 1115  TempSrc:   PainSc: 2       Patients Stated Pain Goal: 2 (90/24/09 7353)  Complications: No complications documented.

## 2021-01-02 NOTE — Anesthesia Procedure Notes (Signed)
Procedure Name: LMA Insertion Date/Time: 01/02/2021 1:21 PM Performed by: Claris Che, CRNA Pre-anesthesia Checklist: Patient identified, Emergency Drugs available, Suction available, Patient being monitored and Timeout performed Patient Re-evaluated:Patient Re-evaluated prior to induction Oxygen Delivery Method: Circle system utilized Preoxygenation: Pre-oxygenation with 100% oxygen Induction Type: IV induction Ventilation: Mask ventilation without difficulty LMA: LMA inserted LMA Size: 4.0 Number of attempts: 1 Placement Confirmation: positive ETCO2 and breath sounds checked- equal and bilateral Tube secured with: Tape Dental Injury: Teeth and Oropharynx as per pre-operative assessment

## 2021-01-02 NOTE — Op Note (Signed)
    Patient name: Gregory [name] MRN: [id number] DOB: 08-Jan-1951 Sex: male  01/02/2021 Pre-operative Diagnosis: Recurrent draining sinus tract in the left groin Post-operative diagnosis:  Same Surgeon:  [name] Assistants: Laurence Slate Procedure:   #1: I&D of left groin   #2: Placement of antibiotic impregnated beads   #3: Prevena wound VAC Anesthesia: General Blood Loss: Minimal Specimens: None  Findings: The patient had a 2 mm opening in his prior left groin incision.  This was excised.  I passed a probe down a small hole which went into the medial thigh.  The graft was well incorporated and never exposed.  I opened up the sinus tract with cautery.  There was no purulence.  The wound was irrigated.  Antibiotic impregnated beads were placed and the incision was closed primarily  Indications: The patient has previously undergone an aortobifemoral bypass graft.  He developed a large seroma in the left groin which ultimately started draining.  I took him back to the operating room for I&D.  The wound has essentially healed however he has a persistent intermittently draining sinus tract through a opening in the skin incision of approximately 2 mm.  He has failed nonoperative treatment and so we discussed exploration in the operating room.  Procedure:  The patient was identified in the holding area and taken to [place] [address]  The patient was then placed supine on the table. general anesthesia was administered.  The patient was prepped and draped in the usual sterile fashion.  A time out was called and antibiotics were administered.  A PA was necessary to extract the procedure and assist with the technical details.  A small elliptical incision was used to excise the skin opening in the left groin incision.  I then used a #1 dilator as a probe to see if I could pass it into the wound.  I had difficulty finding a passage however ultimately this slid down into the medial thigh.  I then extended  the skin incision so that I could open up the sinus tract.  No purulence was visualized.  The graft was not exposed and appeared well incorporated as there was significant scar tissue in the groin.  The wound was then irrigated.  I placed antibiotic beads and closed the incision and subcutaneous tissue with interrupted 3-0 Vicryl.  3 oh vertical nylon mattress sutures were used to close the skin and a Prevena wound VAC was placed.  There were no immediate complications.   Disposition: To PACU stable.   Theotis Burrow, M.D., [facility] [place] Office: [phone] Pager:  [phone]

## 2021-01-03 ENCOUNTER — Encounter (HOSPITAL_COMMUNITY): Payer: Self-pay | Admitting: Surgery

## 2021-01-03 ENCOUNTER — Telehealth: Payer: Self-pay

## 2021-01-03 NOTE — Telephone Encounter (Signed)
Patient called to ask about wound vac removal. It is a prevena, advised patient that it would shut off in 7-10 days and he could peel it off. He will follow up with [place] on 01/14/21. Knows to call sooner if there are problems.

## 2021-01-08 ENCOUNTER — Telehealth: Payer: Self-pay

## 2021-01-08 NOTE — Telephone Encounter (Signed)
Patient called to ask about showering. Advised patient that when prevena comes off tomorrow, he can shower. Instructed patient to not bathe or soak incision, but can shower and pat incision dry. After that, keep covered with clean dry gauze. Patient verbalizes understanding.

## 2021-01-14 ENCOUNTER — Ambulatory Visit (INDEPENDENT_AMBULATORY_CARE_PROVIDER_SITE_OTHER): Payer: PPO | Admitting: Physician Assistant

## 2021-01-14 ENCOUNTER — Other Ambulatory Visit: Payer: Self-pay

## 2021-01-14 ENCOUNTER — Encounter: Payer: Self-pay | Admitting: Physician Assistant

## 2021-01-14 VITALS — BP 132/71 | HR 59 | Temp 98.1°F | Resp 20 | Ht 65.5 in | Wt 135.8 lb

## 2021-01-14 DIAGNOSIS — T8189XD Other complications of procedures, not elsewhere classified, subsequent encounter: Secondary | ICD-10-CM

## 2021-01-14 DIAGNOSIS — Z95828 Presence of other vascular implants and grafts: Secondary | ICD-10-CM

## 2021-01-14 NOTE — Progress Notes (Signed)
POST OPERATIVE OFFICE NOTE    CC:  F/u for surgery  HPI:  This is a 70 y.o. male.    The patient has previously undergone an aortobifemoral bypass graft.  He developed a large seroma in the left groin which ultimately started draining.     He returned to the OR for I & D of the left groin.  The tissue grew over the wound, but a sinus tract developed and he was returned to the OR 01/02/21 for I & D of the groin, placement of antibiotic beads and Prevena vac.    Pt returns today for follow up.  They maintained the wound vac for 7 days.  The area is still draining clear yellowish fluid daily.  The change the dry dressing as needed.  He denise fever and chills.    Allergies  Allergen Reactions  . Codeine Anaphylaxis    Current Outpatient Medications  Medication Sig Dispense Refill  . acetaminophen (TYLENOL) 325 MG tablet Take 2 tablets (650 mg total) by mouth every 6 (six) hours as needed for mild pain.    [name] albuterol (PROVENTIL HFA;VENTOLIN HFA) 108 (90 Base) MCG/ACT inhaler Inhale 2 puffs into the lungs every 6 (six) hours as needed for wheezing. 3 Inhaler 1  . amLODipine (NORVASC) 10 MG tablet TAKE ONE TABLET BY MOUTH DAILY (Patient taking differently: Take 10 mg by mouth daily.) 90 tablet 0  . aspirin EC 325 MG EC tablet Take 1 tablet (325 mg total) by mouth daily. 30 tablet 0  . atorvastatin (LIPITOR) 80 MG tablet TAKE ONE TABLET BY MOUTH DAILY (Patient taking differently: Take 80 mg by mouth daily.) 90 tablet 3  . divalproex (DEPAKOTE ER) 500 MG 24 hr tablet     . divalproex (DEPAKOTE) 500 MG DR tablet Take 1,000-1,500 mg by mouth at bedtime.     . docusate sodium (COLACE) 100 MG capsule Take 100 mg by mouth 2 (two) times daily as needed for moderate constipation.    . finasteride (PROSCAR) 5 MG tablet Take 1 tablet (5 mg total) by mouth daily. 90 tablet 0  . fluticasone (FLONASE) 50 MCG/ACT nasal spray SPRAY 2 SPRAYS INTO THE AFFECTED NOSTRIL DAILY (Patient taking differently: Place  2 sprays into both nostrils daily. SPRAY 2 SPRAYS INTO THE AFFECTED NOSTRIL DAILY) 48 mL 10  . Fluticasone-Salmeterol (ADVAIR DISKUS) 500-50 MCG/DOSE AEPB Inhale 1 puff into the lungs every 12 (twelve) hours. 180 each 3  . lamoTRIgine (LAMICTAL) 200 MG tablet Take 1 tablet (200 mg total) by mouth every morning. 90 tablet 3  . lisinopril-hydrochlorothiazide (ZESTORETIC) 20-12.5 MG tablet TAKE ONE TABLET BY MOUTH EVERY MORNING (Patient taking differently: Take 1 tablet by mouth daily.) 90 tablet 0  . Multiple Vitamins-Minerals (MULTIVITAMIN ADULTS 50+ PO) Take 1 tablet by mouth daily.     . polyethylene glycol (MIRALAX / GLYCOLAX) 17 g packet Take 17 g by mouth daily as needed for moderate constipation.     No current facility-administered medications for this visit.     ROS:  See HPI  Physical Exam:    Incision:  No erythema or significant edema.  Expressed clear drainage from  Incision.  Nylons intact. Extremities:  Brisk doppler signal left LE DP/PT/Peroneal   Assessment/Plan:  This is a 70 y.o. male who is s/p: Procedure:   #1: I&D of left groin                         #2: Placement  of antibiotic impregnated beads                         #3: Prevena wound VAC  Continue current care with dry dressing changes PRN.  F/U in 1 week for re exam and possible suture removal.  He denise fever and chills.  If he develops erythema, fever, or chills he will call sooner.  Dr. Trula Slade did examine the incision today in office.  Roxy Horseman PA-C [facility] [phone]  Clinic MD:  Trula Slade

## 2021-01-17 ENCOUNTER — Other Ambulatory Visit: Payer: Self-pay

## 2021-01-17 ENCOUNTER — Encounter: Payer: Self-pay | Admitting: Family Medicine

## 2021-01-17 ENCOUNTER — Ambulatory Visit (INDEPENDENT_AMBULATORY_CARE_PROVIDER_SITE_OTHER): Payer: PPO | Admitting: Family Medicine

## 2021-01-17 VITALS — BP 108/72 | HR 50 | Temp 96.3°F | Wt 137.6 lb

## 2021-01-17 DIAGNOSIS — L03032 Cellulitis of left toe: Secondary | ICD-10-CM

## 2021-01-17 MED ORDER — AMOXICILLIN-POT CLAVULANATE 875-125 MG PO TABS: 1.0000 | ORAL_TABLET | Freq: Two times a day (BID) | ORAL | 0 refills | Status: DC

## 2021-01-17 NOTE — Progress Notes (Signed)
   Subjective:    Patient ID: Gregory [name], male    DOB: 28-Aug-1951, 70 y.o.   MRN: [id number]  HPI He complains of a 2-week history of left great toenail discomfort.  He states that the pain is at the base of the nail and tender to palpation.  No history of injury.  No other digits are involved.  Review of Systems     Objective:   Physical Exam Exam of the left great toe does show slight erythema at the base of the nail.  No drainage is noted.  Full motion of the DIP joint.  The nail itself is quite thick.       Assessment & Plan:  Cellulitis of toe of left foot - Plan: amoxicillin-clavulanate (AUGMENTIN) 875-125 MG tablet I will treat with Augmentin to see if this will help with that.  Concerned that the nail matrix might possibly be involved with his onychomycosis.  If this does not improve, I will consider using Lamisil to treat the onychomycosis.

## 2021-01-17 NOTE — Patient Instructions (Signed)
Take the antibiotic and leave a message on my chart in about 10 days as to how you are doing

## 2021-01-24 ENCOUNTER — Telehealth: Payer: Self-pay | Admitting: Family Medicine

## 2021-01-24 ENCOUNTER — Other Ambulatory Visit: Payer: Self-pay

## 2021-01-24 ENCOUNTER — Ambulatory Visit (INDEPENDENT_AMBULATORY_CARE_PROVIDER_SITE_OTHER): Payer: PPO | Admitting: Physician Assistant

## 2021-01-24 VITALS — BP 126/71 | HR 49 | Temp 98.2°F | Resp 20 | Ht 65.5 in | Wt 138.9 lb

## 2021-01-24 DIAGNOSIS — T8189XD Other complications of procedures, not elsewhere classified, subsequent encounter: Secondary | ICD-10-CM

## 2021-01-24 MED ORDER — TERBINAFINE HCL 250 MG PO TABS: 250.0000 mg | ORAL_TABLET | Freq: Every day | ORAL | 0 refills | Status: DC

## 2021-01-24 NOTE — Progress Notes (Signed)
POST OPERATIVE OFFICE NOTE    CC:  F/u for surgery  HPI:  This is a 70 y.o. male.    The patient has previously undergone an aortobifemoral bypass graft. He developed a large seroma in the left groin which ultimately started draining.                He returned to the OR for I & D of the left groin.  The tissue grew over the wound, but a sinus tract developed and he was returned to the OR 01/02/21 for I & D of the groin, placement of antibiotic beads and Prevena vac.    Pt returns today for follow up.  The vac was removed last visit and they have been using dry guaze changes daily.  He has nylon stiches and we plan to remove those today.       Allergies  Allergen Reactions  . Codeine Anaphylaxis    Current Outpatient Medications  Medication Sig Dispense Refill  . acetaminophen (TYLENOL) 325 MG tablet Take 2 tablets (650 mg total) by mouth every 6 (six) hours as needed for mild pain.    [name] albuterol (PROVENTIL HFA;VENTOLIN HFA) 108 (90 Base) MCG/ACT inhaler Inhale 2 puffs into the lungs every 6 (six) hours as needed for wheezing. 3 Inhaler 1  . amLODipine (NORVASC) 10 MG tablet TAKE ONE TABLET BY MOUTH DAILY (Patient taking differently: Take 10 mg by mouth daily.) 90 tablet 0  . amoxicillin-clavulanate (AUGMENTIN) 875-125 MG tablet Take 1 tablet by mouth 2 (two) times daily. 20 tablet 0  . aspirin EC 325 MG EC tablet Take 1 tablet (325 mg total) by mouth daily. 30 tablet 0  . atorvastatin (LIPITOR) 80 MG tablet TAKE ONE TABLET BY MOUTH DAILY (Patient taking differently: Take 80 mg by mouth daily.) 90 tablet 3  . divalproex (DEPAKOTE) 500 MG DR tablet Take 1,000-1,500 mg by mouth at bedtime.     . docusate sodium (COLACE) 100 MG capsule Take 100 mg by mouth 2 (two) times daily as needed for moderate constipation.    . finasteride (PROSCAR) 5 MG tablet Take 1 tablet (5 mg total) by mouth daily. 90 tablet 0  . fluticasone (FLONASE) 50 MCG/ACT nasal spray SPRAY 2 SPRAYS INTO THE AFFECTED  NOSTRIL DAILY (Patient taking differently: Place 2 sprays into both nostrils daily. SPRAY 2 SPRAYS INTO THE AFFECTED NOSTRIL DAILY) 48 mL 10  . Fluticasone-Salmeterol (ADVAIR DISKUS) 500-50 MCG/DOSE AEPB Inhale 1 puff into the lungs every 12 (twelve) hours. 180 each 3  . lamoTRIgine (LAMICTAL) 200 MG tablet Take 1 tablet (200 mg total) by mouth every morning. 90 tablet 3  . lisinopril-hydrochlorothiazide (ZESTORETIC) 20-12.5 MG tablet TAKE ONE TABLET BY MOUTH EVERY MORNING (Patient taking differently: Take 1 tablet by mouth daily.) 90 tablet 0  . Multiple Vitamins-Minerals (MULTIVITAMIN ADULTS 50+ PO) Take 1 tablet by mouth daily.     . polyethylene glycol (MIRALAX / GLYCOLAX) 17 g packet Take 17 g by mouth daily as needed for moderate constipation.    . terbinafine (LAMISIL) 250 MG tablet Take 1 tablet (250 mg total) by mouth daily. 14 tablet 0   No current facility-administered medications for this visit.     ROS:  See HPI  Physical Exam:    Sutures removed Old dressing revealed 5cm circle of yellow/clear drainage on 4 x 4   Incision:  Well healed over all nylon sutures removed patient tolerated this well No erythema or edema.   Assessment/Plan:  This is  a 70 y.o. male who is s/p:status post aortobifemoral bypass graft.  He developed a left groin seroma which was spontaneously drainage  S/P I & D left groin with placement of antibiotic beads.  He still has minimal SS drainage.  He will f/u in 3 weeks for incision check and drainage check.    The aortobi bypass was performed 10/03/20.     Roxy Horseman PA-C [facility] [phone]  Clinic MD:  Stanford Breed

## 2021-01-24 NOTE — Telephone Encounter (Signed)
LVM for pt . KH 

## 2021-01-24 NOTE — Telephone Encounter (Signed)
Pt called and states that his toe is not any better with the amoxicillin. Pt states he thinks it has gotten worse he  Wants to know if he should start on the other rx lamsil   Pt uses [facility] [address], [address]

## 2021-01-24 NOTE — Telephone Encounter (Signed)
Let him know that I called it in and have him call me if the end of the 2-week course.

## 2021-01-25 NOTE — Telephone Encounter (Signed)
Pt was advised Gregory [name] 

## 2021-02-08 ENCOUNTER — Encounter: Payer: Self-pay | Admitting: Family Medicine

## 2021-02-08 ENCOUNTER — Ambulatory Visit (INDEPENDENT_AMBULATORY_CARE_PROVIDER_SITE_OTHER): Payer: PPO | Admitting: Family Medicine

## 2021-02-08 ENCOUNTER — Other Ambulatory Visit: Payer: Self-pay

## 2021-02-08 VITALS — BP 142/74 | HR 59 | Temp 97.5°F | Wt 140.0 lb

## 2021-02-08 DIAGNOSIS — L03032 Cellulitis of left toe: Secondary | ICD-10-CM | POA: Diagnosis not present

## 2021-02-08 DIAGNOSIS — Z95828 Presence of other vascular implants and grafts: Secondary | ICD-10-CM

## 2021-02-08 MED ORDER — TERBINAFINE HCL 250 MG PO TABS: 250.0000 mg | ORAL_TABLET | Freq: Every day | ORAL | 0 refills | Status: DC

## 2021-02-08 NOTE — Progress Notes (Signed)
   Subjective:    Patient ID: Gregory [name], male    DOB: 01-27-1951, 70 y.o.   MRN: [id number]  HPI He is here for a recheck.  He states that since being placed on the Lamisil, he is 60 to 70% better.  He still does occasionally have some discomfort in that area. He is apparently scheduled to be seen by see CVTS in the near future concerning his PVD.  Review of Systems     Objective:   Physical Exam  Exam of the left great toe does show less erythema at the cuticle of the great toe.  No tenderness to palpation.       Assessment & Plan:  Cellulitis of toe of left foot - Plan: terbinafine (LAMISIL) 250 MG tablet  S/P aortobifemoral bypass surgery Since he is 60 to 70% better think we need to work towards getting closer to 100%.  I will give him another 2 weeks worth.  He was comfortable with that.

## 2021-02-11 ENCOUNTER — Ambulatory Visit: Payer: PPO

## 2021-02-18 ENCOUNTER — Ambulatory Visit (INDEPENDENT_AMBULATORY_CARE_PROVIDER_SITE_OTHER): Payer: PPO | Admitting: Physician Assistant

## 2021-02-18 ENCOUNTER — Other Ambulatory Visit: Payer: Self-pay

## 2021-02-18 VITALS — BP 142/67 | HR 50 | Temp 98.5°F | Resp 20 | Ht 65.5 in | Wt 140.5 lb

## 2021-02-18 DIAGNOSIS — T8189XD Other complications of procedures, not elsewhere classified, subsequent encounter: Secondary | ICD-10-CM

## 2021-02-18 NOTE — Progress Notes (Signed)
Office Note     CC:  follow up left groin drainage Requesting Provider:  [name], MD  HPI: Gregory [name] is a 70 y.o. (02-Jul-1951) male who presents for follow-up of left groin incision dehiscence with subsequent wash-out on 01/02/2021.  He is status post aortobifemoral bypass graft on October 03, 2020 by Dr. Trula Slade.  The patient is active and walks approximately 1 mile on his treadmill which he tracks with his Fitbit.  He denies lower extremity pain or rest pain.  He states he has a left great toenail fungal infection.  He denies fever or chills.  He is applying a dry dressing once a day.  His wife accompanies him today and does his dressing changes.  She states she feels as though the drainage is somewhat increased as compared to his visit on 2/3/ 2022.  He is compliant with aspirin and statin.  He is not diabetic.   Past Medical History:  Diagnosis Date  . Alcohol abuse   . Allergy   . Aortic stenosis   . Arthritis   . Asthma    exercise indused or pollen  . Bipolar disorder ([place])   . CAD (coronary artery disease)    coronary calcifications 08/2013 CTA  . Carotid artery occlusion   . Chronic back pain   . Claudication ([place])   . Complication of anesthesia 10/16/2020   "had a panic attack when the mask was put on me"" I do not think I was given anything  before, I need something to calm me down"  . Depression   . Family history of skin cancer   . Heart murmur   . Hepatitis    h/o 1970,doesn't remember which type,1990 epstain-barr  . Hyperlipidemia   . Hypertension   . Neuromuscular disorder ([place])   . Obsessive compulsive disorder   . PAD (peripheral artery disease) ([place])   . Peripheral neuropathy   . Pneumonia   . RBBB   . 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 Severe aortic stenosis   . Sleep apnea    does not wear CPAP  . Smoker   . Spinal stenosis at L4-L5 level   . Tobacco abuse   . Tobacco use disorder     Past  Surgical History:  Procedure Laterality Date  . ANAL FISTULECTOMY  09/25/11  . AORTA - BILATERAL FEMORAL ARTERY BYPASS GRAFT N/A 10/03/2020   Procedure: AORTA BIFEMORAL BYPASS GRAFT USING A HEMASHIELD GOLD BIFURCATED 14 X 47mm GRAFT AND INFERIOR MESENTERIC ARTERY REIMPLANTATION;  Surgeon: Serafina Mitchell, MD;  Location: [facility];  Service: Vascular;  Laterality: N/A;  . AORTIC VALVE REPLACEMENT N/A 11/24/2019   Procedure: AORTIC VALVE REPLACEMENT (AVR) using INSPIRIS Resilia 23 MM Bioprosthetic Aortic Valve.;  Surgeon: Gaye Pollack, MD;  Location: [facility] OR;  Service: Open Heart Surgery;  Laterality: N/A;  . APPLICATION OF WOUND VAC Left 10/17/2020   Procedure: APPLICATION OF WOUND VAC;  Surgeon: Serafina Mitchell, MD;  Location: [place];  Service: Vascular;  Laterality: Left;  . COLONOSCOPY  2009  . CORONARY ARTERY BYPASS GRAFT N/A 11/24/2019   Procedure: CORONARY ARTERY BYPASS GRAFTING (CABG) using LIMA to LAD.;  Surgeon: Gaye Pollack, MD;  Location: [facility] OR;  Service: Open Heart Surgery;  Laterality: N/A;  . ELBOW SURGERY     bilaterally for cubital tunnel  . ENDARTERECTOMY N/A 10/03/2020   Procedure: AORTIC ENDARTERECTOMY;  Surgeon: Serafina Mitchell, MD;  [facility];  Service: Vascular;  Laterality: N/A;  . ENDARTERECTOMY FEMORAL Bilateral 10/03/2020   Procedure: BILATERAL FEMORAL ENDARTERECTOMY;  Surgeon: Serafina Mitchell, MD;  Location: [facility] OR;  Service: Vascular;  Laterality: Bilateral;  . HERNIA REPAIR     with mesh  . INCISION AND DRAINAGE Left 01/02/2021   Procedure: INCISION AND DRAINAGE LEFT GROIN WITH STIMULAN BEADS;  Surgeon: Serafina Mitchell, MD;  Location: [place];  Service: Vascular;  Laterality: Left;  . INCISION AND DRAINAGE OF WOUND Left 10/17/2020   Procedure: EXPLORATION LEFT GROIN WOUND;  Surgeon: Serafina Mitchell, MD;  Location: [place];  Service: Vascular;  Laterality: Left;  [name] MAXIMUM ACCESS (MAS)POSTERIOR LUMBAR INTERBODY FUSION (PLIF) 1 LEVEL N/A 10/25/2014   Procedure:  LUMBAR FOUR TO FIVE MAXIMUM ACCESS (MAS) POSTERIOR LUMBAR INTERBODY FUSION (PLIF) 1 LEVEL;  Surgeon: Eustace Moore, MD;  Location: [place] [facility];  Service: Neurosurgery;  Laterality: N/A;  . RIGHT/LEFT HEART CATH AND CORONARY ANGIOGRAPHY N/A 11/10/2019   Procedure: RIGHT/LEFT HEART CATH AND CORONARY ANGIOGRAPHY;  Surgeon: [name], [name], MD;  Location: [facility];  Service: Cardiovascular;  Laterality: N/A;  . 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 TEE WITHOUT CARDIOVERSION N/A 11/24/2019   Procedure: TRANSESOPHAGEAL ECHOCARDIOGRAM (TEE);  Surgeon: Gaye Pollack, MD;  Location: [facility];  Service: Open Heart Surgery;  Laterality: N/A;  . TONSILLECTOMY  age 88  . VASECTOMY     X 2  . VASECTOMY REVERSAL      Social History   Socioeconomic History  . Marital status: Married    Spouse name: Not on file  . Number of children: 6  . Years of education: Not on file  . Highest education level: Not on file  Occupational History  . Not on file  Tobacco Use  . Smoking status: Current Every Day Smoker    Packs/day: 0.50    Years: 25.00    Pack years: 12.50    Types: Cigarettes  . Smokeless tobacco: Never Used  . Tobacco comment: Pt had stopped smoking X10 years/ currently smoking a pack a day   Vaping Use  . Vaping Use: Never used  Substance and Sexual Activity  . Alcohol use: Not Currently    Comment: 06/26/2014- last drink- AA  . Drug use: No    Comment: former alcoholic  . Sexual activity: Yes  Other Topics Concern  . Not on file  Social History Narrative   Lives in [place] with wife.  Does not routinely exercise.  Sedentary r/t chronic lbp.   Social Determinants of Health   Financial Resource Strain: Not on file  Food Insecurity: Not on file  Transportation Needs: Not on file  Physical Activity: Not on file  Stress: Not on file  Social Connections: Not on file  Intimate Partner Violence: Not on file   Family  History  Problem Relation Age of Onset  . Stroke Father 31  . Cancer Father        skin  . Drug abuse Father   . Hypertension Brother   . Diabetes Brother   . Drug abuse Brother   . Heart disease Brother        s/p CABG, pacemaker  . Heart failure Mother   . Cancer Maternal Aunt        skin  . Cancer Maternal Grandmother        liver    Current Outpatient Medications  Medication Sig Dispense Refill  . acetaminophen (TYLENOL) 325 MG tablet Take 2 tablets (650 mg total) by mouth every 6 (six) hours as needed for mild pain.    [name] albuterol (PROVENTIL HFA;VENTOLIN HFA) 108 (90 Base) MCG/ACT inhaler Inhale 2 puffs into the lungs every 6 (six) hours as needed for wheezing. 3 Inhaler 1  . amLODipine (NORVASC) 10 MG tablet TAKE ONE TABLET BY MOUTH DAILY (Patient taking differently: Take 10 mg by mouth daily.) 90 tablet 0  . aspirin EC 325 MG EC tablet Take 1 tablet (325 mg total) by mouth daily. 30 tablet 0  . atorvastatin (LIPITOR) 80 MG tablet TAKE ONE TABLET BY MOUTH DAILY (Patient taking differently: Take 80 mg by mouth daily.) 90 tablet 3  . divalproex (DEPAKOTE) 500 MG DR tablet Take 1,000-1,500 mg by mouth at bedtime.     . finasteride (PROSCAR) 5 MG tablet Take 1 tablet (5 mg total) by mouth daily. 90 tablet 0  . fluticasone (FLONASE) 50 MCG/ACT nasal spray SPRAY 2 SPRAYS INTO THE AFFECTED NOSTRIL DAILY (Patient taking differently: Place 2 sprays into both nostrils daily. SPRAY 2 SPRAYS INTO THE AFFECTED NOSTRIL DAILY) 48 mL 10  . Fluticasone-Salmeterol (ADVAIR DISKUS) 500-50 MCG/DOSE AEPB Inhale 1 puff into the lungs every 12 (twelve) hours. 180 each 3  . lamoTRIgine (LAMICTAL) 200 MG tablet Take 1 tablet (200 mg total) by mouth every morning. 90 tablet 3  . lisinopril-hydrochlorothiazide (ZESTORETIC) 20-12.5 MG tablet TAKE ONE TABLET BY MOUTH EVERY MORNING (Patient taking differently: Take 1 tablet by mouth daily.) 90 tablet 0  . Multiple Vitamins-Minerals (MULTIVITAMIN ADULTS 50+ PO)  Take 1 tablet by mouth daily.     [name] terbinafine (LAMISIL) 250 MG tablet Take 1 tablet (250 mg total) by mouth daily. 14 tablet 0   No current facility-administered medications for this visit.    Allergies  Allergen Reactions  . Codeine Anaphylaxis     REVIEW OF SYSTEMS:   [X]  denotes positive finding, [ ]  denotes negative finding Cardiac  Comments:  Chest pain or chest pressure:    Shortness of breath upon exertion:    Short of breath when lying flat:    Irregular heart rhythm:        Vascular    Pain in calf, thigh, or hip brought on by ambulation:    Pain in feet at night that wakes you up from your sleep:     Blood clot in your veins:    Leg swelling:         Pulmonary    Oxygen at home:    Productive cough:     Wheezing:         Neurologic    Sudden weakness in arms or legs:     Sudden numbness in arms or legs:     Sudden onset of difficulty speaking or slurred speech:    Temporary loss of vision in one eye:     Problems with dizziness:         Gastrointestinal    Blood in stool:     Vomited blood:         Genitourinary    Burning when urinating:     Blood in urine:        Psychiatric    Major depression:         Hematologic    Bleeding problems:    Problems with blood clotting too easily:        Skin    Rashes or  ulcers:        Constitutional    Fever or chills:      PHYSICAL EXAMINATION:  Vitals:   02/18/21 1016  BP: (!) 142/67  Pulse: (!) 50  Resp: 20  Temp: 98.5 F (36.9 C)  TempSrc: Temporal  SpO2: 100%  Weight: 140 lb 8 oz (63.7 kg)  Height: 5' 5.5" (1.664 m)    General:  WDWN in NAD; vital signs documented above Gait: Not observed HENT: WNL, normocephalic Pulmonary: normal non-labored breathing  Cardiac: regular HR,  Abdomen: soft, NT, no masses. Incision well-healed. Skin: with rashes along lower left abd from tape adhesive Extremities: Left groin: pin-hole area of serous drainage at mid-incision. No erythema or tenderness  to palpation. without ischemic changes, without Gangrene , without cellulitis; without open wounds;  Foot is warm with intact motor and sensation. Mild ankle edema. Pedal pulses are not palpable Musculoskeletal: no muscle wasting or atrophy  Neurologic: A&O X 3;  No focal weakness or paresthesias are detected Psychiatric:  The pt has Normal affect.  ASSESSMENT/PLAN:: 70 y.o. male here for follow up for left groin drainage.  Dr. Trula Slade examined the patient and applied silver nitrate to the small skin edge opening.  Hopefully this will assist with skin edge granulation and closure.  The wound was covered with ABD pad.  We will plan to recheck this area in 2 weeks.  We will make arrangements for his routine 77-month follow-up for ABIs in July.  Barbie Banner, PA-C [facility] [phone]  Clinic MD:   Trula Slade

## 2021-02-21 ENCOUNTER — Other Ambulatory Visit (INDEPENDENT_AMBULATORY_CARE_PROVIDER_SITE_OTHER): Payer: PPO

## 2021-02-21 ENCOUNTER — Encounter: Payer: Self-pay | Admitting: Family Medicine

## 2021-02-21 ENCOUNTER — Other Ambulatory Visit: Payer: Self-pay

## 2021-02-21 ENCOUNTER — Telehealth (INDEPENDENT_AMBULATORY_CARE_PROVIDER_SITE_OTHER): Payer: PPO | Admitting: Family Medicine

## 2021-02-21 ENCOUNTER — Other Ambulatory Visit: Payer: Self-pay | Admitting: Family Medicine

## 2021-02-21 VITALS — Wt 140.5 lb

## 2021-02-21 DIAGNOSIS — R0982 Postnasal drip: Secondary | ICD-10-CM

## 2021-02-21 DIAGNOSIS — R059 Cough, unspecified: Secondary | ICD-10-CM

## 2021-02-21 DIAGNOSIS — R0981 Nasal congestion: Secondary | ICD-10-CM | POA: Diagnosis not present

## 2021-02-21 DIAGNOSIS — J3489 Other specified disorders of nose and nasal sinuses: Secondary | ICD-10-CM

## 2021-02-21 DIAGNOSIS — J0110 Acute frontal sinusitis, unspecified: Secondary | ICD-10-CM

## 2021-02-21 LAB — POC COVID19 BINAXNOW: SARS Coronavirus 2 Ag: NEGATIVE

## 2021-02-21 MED ORDER — AMOXICILLIN-POT CLAVULANATE 875-125 MG PO TABS: 1.0000 | ORAL_TABLET | Freq: Two times a day (BID) | ORAL | 0 refills | Status: DC

## 2021-02-21 NOTE — Progress Notes (Signed)
   Subjective:  Documentation for virtual telephone encounter.  The patient was located at home. The provider was located in the office. The patient did consent to this visit and is aware of possible charges through their insurance for this visit.  The other persons participating in this telemedicine service were none. Time spent on call was 16 minutes and in review of previous records 18 minutes total.  This virtual service is not related to other E/M service within previous 7 days.  99441 (5-33min) 99442 (11-87min) 99443 (21-36min)   Patient ID: Gregory [name], male    DOB: October 11, 1951, 70 y.o.   MRN: [id number]  HPI Chief Complaint  Patient presents with  . other    Sinus infection started Sat. Cough, ST post nasal drip pain in the face and gums,    Complains of a 6 day history of gradually worsening symptoms including nasal congestion, post nasal drip, pain behind his left eye, clear nasal drainage, productive cough with clear sputum.   States his symptoms started with "cold symptoms" and fatigue.   Denies fever, chills, body aches, dizziness, chest pain, palpitations, shortness of breath, abdominal pain, N/V/D, urinary symptoms, LE edema.    States he is taking Flonase, Mucinex and Tylenol.  Has underlying allergies.  He also has asthma which is controlled per patient.   He does smoke   He has had 3 Pfizer Covid vaccines.  Reports having a negative Covid test at home.   Review of Systems Pertinent positives and negatives in the history of present illness.     Objective:   Physical Exam Wt 140 lb 8 oz (63.7 kg)   BMI 23.02 kg/m   Alert and oriented in no acute distress.  Speaking in complete sentences without difficulty.  He does sound nasally congested.  Reports tenderness to palpation to his frontal sinuses with tenderness behind his left ear as well.       Assessment & Plan:  Acute non-recurrent frontal sinusitis  Post-nasal drainage  Sinus  pressure  Nasal congestion with rhinorrhea  Discussed symptomatic treatment.  He will come to our office parking lot for rapid and PCR Covid testing.  I will also treat him for sinusitis due to the length and course of his illness as well as his underlying health conditions. Discussed supportive care. Asthma without exacerbation.  Continue treating underlying allergies. Follow-up if worsening or not back to baseline after completing the antibiotic.

## 2021-02-22 LAB — SARS-COV-2, NAA 2 DAY TAT

## 2021-02-22 LAB — NOVEL CORONAVIRUS, NAA: SARS-CoV-2, NAA: NOT DETECTED

## 2021-02-27 ENCOUNTER — Other Ambulatory Visit: Payer: Self-pay | Admitting: *Deleted

## 2021-02-27 DIAGNOSIS — I779 Disorder of arteries and arterioles, unspecified: Secondary | ICD-10-CM

## 2021-02-27 DIAGNOSIS — I70213 Atherosclerosis of native arteries of extremities with intermittent claudication, bilateral legs: Secondary | ICD-10-CM

## 2021-03-04 ENCOUNTER — Ambulatory Visit (HOSPITAL_COMMUNITY)
Admission: RE | Admit: 2021-03-04 | Discharge: 2021-03-04 | Disposition: A | Discharge status: Home / Self Care | Payer: PPO | Source: Ambulatory Visit | Attending: Surgery | Admitting: Surgery

## 2021-03-04 ENCOUNTER — Ambulatory Visit (INDEPENDENT_AMBULATORY_CARE_PROVIDER_SITE_OTHER): Payer: PPO | Admitting: Physician Assistant

## 2021-03-04 ENCOUNTER — Other Ambulatory Visit: Payer: Self-pay

## 2021-03-04 VITALS — BP 146/74 | HR 53 | Temp 98.8°F | Resp 20 | Ht 65.5 in | Wt 137.5 lb

## 2021-03-04 DIAGNOSIS — I779 Disorder of arteries and arterioles, unspecified: Secondary | ICD-10-CM

## 2021-03-04 DIAGNOSIS — I70213 Atherosclerosis of native arteries of extremities with intermittent claudication, bilateral legs: Secondary | ICD-10-CM

## 2021-03-04 DIAGNOSIS — Z95828 Presence of other vascular implants and grafts: Secondary | ICD-10-CM

## 2021-03-04 DIAGNOSIS — T8189XD Other complications of procedures, not elsewhere classified, subsequent encounter: Secondary | ICD-10-CM

## 2021-03-04 NOTE — Progress Notes (Signed)
POST OPERATIVE OFFICE NOTE    CC:  F/u for surgery  HPI:  This is a 70 y.o. male who is s/p Aortobifemoral bypass graft by Dr. Trula Slade on 10/03/20. He has had recurrent issues since surgery with left groin incisional dehiscence and continued serous drainage. He was just seen on 02/18/21 at which time he had continued drainage. Silver nitrate was applied to the wound edge and returns today for a 2 week follow up.  Today he presents with his wife. He says that the drainage has been essentially unchanged. His wife assists in changing the dressings daily. He says that on days that he is more active there is increased drainage and on more sedentary days there may be little to no drainage. It overall has not change in color. Denies any malodor, redness, increased swelling, and no fever or chills. He remains very active and walks approximately 1 mile on his treadmill daily sometimes up to 2 miles on his more active days. He tracks with his Fitbit.  He denies lower extremity pain or rest pain.  He states he has a left great toenail fungal infection which is continuing to give him some discomfort in left foot. He is compliant with aspirin and statin.     Allergies  Allergen Reactions  . Codeine Anaphylaxis    Current Outpatient Medications  Medication Sig Dispense Refill  . acetaminophen (TYLENOL) 325 MG tablet Take 2 tablets (650 mg total) by mouth every 6 (six) hours as needed for mild pain.    [name] albuterol (PROVENTIL HFA;VENTOLIN HFA) 108 (90 Base) MCG/ACT inhaler Inhale 2 puffs into the lungs every 6 (six) hours as needed for wheezing. 3 Inhaler 1  . amLODipine (NORVASC) 10 MG tablet TAKE ONE TABLET BY MOUTH DAILY (Patient taking differently: Take 10 mg by mouth daily.) 90 tablet 0  . aspirin EC 325 MG EC tablet Take 1 tablet (325 mg total) by mouth daily. 30 tablet 0  . atorvastatin (LIPITOR) 80 MG tablet TAKE ONE TABLET BY MOUTH DAILY (Patient taking differently: Take 80 mg by mouth daily.) 90  tablet 3  . divalproex (DEPAKOTE) 500 MG DR tablet Take 1,000-1,500 mg by mouth at bedtime.     . finasteride (PROSCAR) 5 MG tablet Take 1 tablet (5 mg total) by mouth daily. 90 tablet 0  . fluticasone (FLONASE) 50 MCG/ACT nasal spray SPRAY 2 SPRAYS INTO THE AFFECTED NOSTRIL DAILY (Patient taking differently: Place 2 sprays into both nostrils daily. SPRAY 2 SPRAYS INTO THE AFFECTED NOSTRIL DAILY) 48 mL 10  . Fluticasone-Salmeterol (ADVAIR DISKUS) 500-50 MCG/DOSE AEPB Inhale 1 puff into the lungs every 12 (twelve) hours. 180 each 3  . lamoTRIgine (LAMICTAL) 200 MG tablet Take 1 tablet (200 mg total) by mouth every morning. 90 tablet 3  . lisinopril-hydrochlorothiazide (ZESTORETIC) 20-12.5 MG tablet TAKE ONE TABLET BY MOUTH EVERY MORNING (Patient taking differently: Take 1 tablet by mouth daily.) 90 tablet 0  . Multiple Vitamins-Minerals (MULTIVITAMIN ADULTS 50+ PO) Take 1 tablet by mouth daily.      No current facility-administered medications for this visit.     ROS:  See HPI  Physical Exam:  Vitals:   03/04/21 0825  BP: (!) 146/74  Pulse: (!) 53  Resp: 20  Temp: 98.8 F (37.1 C)  TempSrc: Temporal  SpO2: 100%  Weight: 137 lb 8 oz (62.4 kg)  Height: 5' 5.5" (1.664 m)   General: well nourished, not in any distress Incision:  Left groin incision is almost completely healed. No  swelling. No erythema.  Small pore at the distal aspect of incision with serous drainage. Increased drainage on compression. Some fullness surrounding distal aspect of incision likely from fluid collection Extremities:  Well perfused and warm Neuro: alert and oriented Abdomen:  Soft, non tender  Assessment/Plan:  This is a 70 y.o. male who is s/p aortobifemoral bypass by Dr. Trula Slade. He has had continued left groin drainage. Dr. Trula Slade examined patient and placed single 3-0 Nylon horizontal mattress suture in left groin to hopefully stop drainage. Instructed patient to continue to clean area daily. Dry  dressings applied - He will have his 9 months post op ABI in July  - He will follow up in 3 weeks for wound check and suture removal   Karoline Caldwell, PA-C [facility] [phone]  Clinic MD: Dr. Trula Slade

## 2021-03-05 ENCOUNTER — Other Ambulatory Visit: Payer: Self-pay | Admitting: Family Medicine

## 2021-03-05 DIAGNOSIS — I10 Essential (primary) hypertension: Secondary | ICD-10-CM

## 2021-03-05 DIAGNOSIS — N401 Enlarged prostate with lower urinary tract symptoms: Secondary | ICD-10-CM

## 2021-03-21 ENCOUNTER — Ambulatory Visit (INDEPENDENT_AMBULATORY_CARE_PROVIDER_SITE_OTHER): Payer: PPO | Admitting: Family Medicine

## 2021-03-21 ENCOUNTER — Encounter: Payer: Self-pay | Admitting: Family Medicine

## 2021-03-21 ENCOUNTER — Other Ambulatory Visit: Payer: Self-pay

## 2021-03-21 VITALS — BP 122/68 | HR 56 | Temp 97.8°F | Wt 137.4 lb

## 2021-03-21 DIAGNOSIS — M79675 Pain in left toe(s): Secondary | ICD-10-CM | POA: Diagnosis not present

## 2021-03-21 NOTE — Progress Notes (Signed)
   Subjective:    Patient ID: Gregory [name], male    DOB: January 24, 1951, 70 y.o.   MRN: [id number]  HPI He is here for evaluation of continued difficulty with left great toe pain.  He was treated in the past with Augmentin which had no real benefit in switch to Lamisil which apparently did quite a infection down.  He finished his last dose the first part of March.  Since then he has had intermittent discomfort in the left great toe and in the midfoot area.  He has had previous left vascular surgery and is still recovering from that.  The pain now is more intermittent in nature.   Review of Systems     Objective:   Physical Exam Exam of the left foot shows no evidence of warmth tenderness or erythema.  The left great toenail is thickened and only minimal erythema around that.  It is not tender to palpation.       Assessment & Plan:  Pain of toe of left foot I explained that I did not think the truly had evidence of an infection in need of Lamisil or an antibiotic.  The fact that he was given an antibiotic for recent sinus infection which had no effect on the discomfort.  Explained that this could possibly be the foot readjusting to proper blood flow from his surgery.

## 2021-03-25 ENCOUNTER — Ambulatory Visit (INDEPENDENT_AMBULATORY_CARE_PROVIDER_SITE_OTHER): Payer: PPO | Admitting: Physician Assistant

## 2021-03-25 ENCOUNTER — Other Ambulatory Visit: Payer: Self-pay

## 2021-03-25 ENCOUNTER — Encounter: Payer: Self-pay | Admitting: Physician Assistant

## 2021-03-25 VITALS — BP 142/72 | HR 52 | Temp 98.6°F | Resp 20 | Ht 65.5 in | Wt 139.3 lb

## 2021-03-25 DIAGNOSIS — Z95828 Presence of other vascular implants and grafts: Secondary | ICD-10-CM

## 2021-03-25 DIAGNOSIS — T8189XD Other complications of procedures, not elsewhere classified, subsequent encounter: Secondary | ICD-10-CM

## 2021-03-25 NOTE — Progress Notes (Signed)
  POST OPERATIVE OFFICE NOTE    CC:  F/u for surgery  HPI:  This is a 70 y.o. male who  is s/p Aortobifemoral bypass graft by Dr. Trula Slade on 10/03/20. He has had recurrent issues since surgery with left groin incisional dehiscence and continued serous drainage. He was just seen on 02/18/21 at which time he had continued drainage.   Pt returns today for follow up.  Pt states he has continued to walk daily and has no drainage since the nylon stitch was placed.    Allergies  Allergen Reactions  . Codeine Anaphylaxis    Current Outpatient Medications  Medication Sig Dispense Refill  . acetaminophen (TYLENOL) 325 MG tablet Take 2 tablets (650 mg total) by mouth every 6 (six) hours as needed for mild pain.    [name] albuterol (PROVENTIL HFA;VENTOLIN HFA) 108 (90 Base) MCG/ACT inhaler Inhale 2 puffs into the lungs every 6 (six) hours as needed for wheezing. 3 Inhaler 1  . amLODipine (NORVASC) 10 MG tablet TAKE ONE TABLET BY MOUTH DAILY (Patient taking differently: Take 10 mg by mouth daily.) 90 tablet 0  . aspirin EC 325 MG EC tablet Take 1 tablet (325 mg total) by mouth daily. 30 tablet 0  . atorvastatin (LIPITOR) 80 MG tablet TAKE ONE TABLET BY MOUTH DAILY (Patient taking differently: Take 80 mg by mouth daily.) 90 tablet 3  . divalproex (DEPAKOTE) 500 MG DR tablet Take 1,000-1,500 mg by mouth at bedtime.     . finasteride (PROSCAR) 5 MG tablet TAKE ONE TABLET BY MOUTH DAILY 90 tablet 0  . fluticasone (FLONASE) 50 MCG/ACT nasal spray SPRAY 2 SPRAYS INTO THE AFFECTED NOSTRIL DAILY (Patient taking differently: Place 2 sprays into both nostrils daily. SPRAY 2 SPRAYS INTO THE AFFECTED NOSTRIL DAILY) 48 mL 10  . Fluticasone-Salmeterol (ADVAIR DISKUS) 500-50 MCG/DOSE AEPB Inhale 1 puff into the lungs every 12 (twelve) hours. 180 each 3  . lamoTRIgine (LAMICTAL) 200 MG tablet Take 1 tablet (200 mg total) by mouth every morning. 90 tablet 3  . lisinopril-hydrochlorothiazide (ZESTORETIC) 20-12.5 MG tablet  TAKE ONE TABLET BY MOUTH EVERY MORNING 90 tablet 0  . Multiple Vitamins-Minerals (MULTIVITAMIN ADULTS 50+ PO) Take 1 tablet by mouth daily.      No current facility-administered medications for this visit.     ROS:  See HPI  Physical Exam:    Incision:  Well healed without fluctuants, no drainage to pressure. No erythema or edema. Extremities:  Well perfused warm to touch B LE Lungs : non labored breathing Abdomen:  Soft NTTP   Assessment/Plan:  This is a 70 y.o. male who is s/p:aortobifemoral bypass by Dr. Trula Slade. The left groin has not drained for 2 weeks now and the suture was removed without problem.  He will continue to exercise as tolerates daily.  No restrictions. F/U in July for surveillance ABI's.    He will call if he has problems or concerns.   Roxy Horseman PA-C [facility] [phone]   Clinic MD:  Trula Slade

## 2021-03-26 ENCOUNTER — Other Ambulatory Visit: Payer: Self-pay

## 2021-03-26 DIAGNOSIS — I779 Disorder of arteries and arterioles, unspecified: Secondary | ICD-10-CM

## 2021-04-03 ENCOUNTER — Encounter: Payer: Self-pay | Admitting: Family Medicine

## 2021-04-03 ENCOUNTER — Other Ambulatory Visit: Payer: Self-pay

## 2021-04-03 ENCOUNTER — Ambulatory Visit (INDEPENDENT_AMBULATORY_CARE_PROVIDER_SITE_OTHER): Payer: PPO | Admitting: Family Medicine

## 2021-04-03 VITALS — BP 122/78 | HR 66 | Temp 97.2°F | Ht 65.5 in | Wt 140.8 lb

## 2021-04-03 DIAGNOSIS — I25810 Atherosclerosis of coronary artery bypass graft(s) without angina pectoris: Secondary | ICD-10-CM | POA: Diagnosis not present

## 2021-04-03 DIAGNOSIS — I10 Essential (primary) hypertension: Secondary | ICD-10-CM | POA: Diagnosis not present

## 2021-04-03 DIAGNOSIS — Z0000 Encounter for general adult medical examination without abnormal findings: Secondary | ICD-10-CM

## 2021-04-03 DIAGNOSIS — Z720 Tobacco use: Secondary | ICD-10-CM

## 2021-04-03 DIAGNOSIS — E785 Hyperlipidemia, unspecified: Secondary | ICD-10-CM | POA: Diagnosis not present

## 2021-04-03 DIAGNOSIS — Z951 Presence of aortocoronary bypass graft: Secondary | ICD-10-CM | POA: Diagnosis not present

## 2021-04-03 DIAGNOSIS — Z953 Presence of xenogenic heart valve: Secondary | ICD-10-CM | POA: Diagnosis not present

## 2021-04-03 DIAGNOSIS — I700 Atherosclerosis of aorta: Secondary | ICD-10-CM

## 2021-04-03 DIAGNOSIS — G4733 Obstructive sleep apnea (adult) (pediatric): Secondary | ICD-10-CM

## 2021-04-03 DIAGNOSIS — Z95828 Presence of other vascular implants and grafts: Secondary | ICD-10-CM | POA: Diagnosis not present

## 2021-04-03 DIAGNOSIS — F3170 Bipolar disorder, currently in remission, most recent episode unspecified: Secondary | ICD-10-CM

## 2021-04-03 DIAGNOSIS — I779 Disorder of arteries and arterioles, unspecified: Secondary | ICD-10-CM

## 2021-04-03 DIAGNOSIS — F1021 Alcohol dependence, in remission: Secondary | ICD-10-CM | POA: Diagnosis not present

## 2021-04-03 DIAGNOSIS — I739 Peripheral vascular disease, unspecified: Secondary | ICD-10-CM

## 2021-04-03 DIAGNOSIS — F79 Unspecified intellectual disabilities: Secondary | ICD-10-CM

## 2021-04-03 DIAGNOSIS — J4530 Mild persistent asthma, uncomplicated: Secondary | ICD-10-CM

## 2021-04-03 DIAGNOSIS — J301 Allergic rhinitis due to pollen: Secondary | ICD-10-CM | POA: Diagnosis not present

## 2021-04-03 DIAGNOSIS — Z1211 Encounter for screening for malignant neoplasm of colon: Secondary | ICD-10-CM

## 2021-04-03 DIAGNOSIS — F1994 Other psychoactive substance use, unspecified with psychoactive substance-induced mood disorder: Secondary | ICD-10-CM | POA: Diagnosis not present

## 2021-04-03 DIAGNOSIS — F429 Obsessive-compulsive disorder, unspecified: Secondary | ICD-10-CM

## 2021-04-03 DIAGNOSIS — Z981 Arthrodesis status: Secondary | ICD-10-CM

## 2021-04-03 NOTE — Progress Notes (Signed)
Gregory [name] is a 70 y.o. male who presents for annual wellness visit,CPE and follow-up on chronic medical conditions.  He  Has a history of CAD with CABGx1, status post aortic valve replacement, PVD status post aorto femoral bypass with nonhealing that is finally now quieted down.  Also x-ray evidence of aortic atherosclerosis.  He continues to be followed by cardiology as well as CVTS.  He also sees Dr. [name] for his underlying mood disorder which seems to be going well.  He is now smoking half a pack per day and plans to continue to decrease that.  He is 7 years alcohol free.  He does have OSA but presently is not using CPAP.  He does keep a good record on this with his smart watch and feels comfortable with that.  His allergies seem to be under good control.  He continues on his asthma medication and does use his rescue inhaler mainly for increased physical activities.  Does have a previous history of back pain with fusion but at this point is having no difficulty with that.  He is now 7 years alcohol free.  He does go to [employer] regularly.  Immunizations and Health Maintenance Immunization History  Administered Date(s) Administered  . Influenza Split 09/28/2012, 10/19/2014, 09/17/2015  . Influenza Whole 10/08/2010  . Influenza, High Dose Seasonal PF 10/22/2017, 10/12/2018, 08/24/2019  . Influenza-Unspecified 09/21/2013, 10/09/2016, 10/22/2017  . PFIZER(Purple Top)SARS-COV-2 Vaccination 01/30/2020, 02/24/2020, 11/11/2020  . Pneumococcal Conjugate-13 01/31/2016  . Pneumococcal Polysaccharide-23 08/30/2013  . Tdap 10/29/2012  . Zoster Recombinat (Shingrix) 04/23/2017, 07/19/2017   There are no preventive care reminders to display for this patient.  Last colonoscopy:11/01/2008 colguard( 2019) negative Last PSA: N/A Dentist Q 6 months: Ophtho:Q year Exercise: walking, treadmill 7 days a week.  Other doctors caring for patient include: Dr. Deberah Pelton, Casimer Lanius, Dr. [name]  [facility].  Advanced Directives: Does Patient Have a Medical Advance Directive?: Yes Type of Advance Directive: Living will Does patient want to make changes to medical advance directive?: No - Patient declined  Depression screen:  See questionnaire below.     Depression screen [facility] 2/9 04/03/2021 03/22/2020 01/10/2020 09/29/2019 07/12/2018  Decreased Interest 0 0 0 1 0  Down, Depressed, Hopeless 0 0 0 0 0  PHQ - 2 Score 0 0 0 1 0  Some recent data might be hidden    Fall Screen: See Questionaire below.   Fall Risk  04/03/2021 03/21/2021 01/10/2020 09/29/2019 07/12/2018  Falls in the past year? 0 0 0 0 No  Number falls in past yr: 0 0 - - -  Injury with Fall? 0 0 - - -  Risk for fall due to : No Fall Risks No Fall Risks - - -  Follow up Falls evaluation completed Falls evaluation completed - - -    ADL screen:  See questionnaire below.  Functional Status Survey: Is the patient deaf or have difficulty hearing?: No Does the patient have difficulty seeing, even when wearing glasses/contacts?: Yes Does the patient have difficulty concentrating, remembering, or making decisions?: Yes (remembering per pt.) Does the patient have difficulty walking or climbing stairs?: No Does the patient have difficulty dressing or bathing?: No Does the patient have difficulty doing errands alone such as visiting a doctor's office or shopping?: No   Review of Systems  Constitutional: -, -unexpected weight change, -anorexia, -fatigue Allergy: -sneezing, -itching, -congestion Dermatology: denies changing moles, rash, lumps ENT: -runny nose, -ear pain, -sore throat,  Cardiology:  -chest  pain, -palpitations, -orthopnea, Respiratory: -cough, -shortness of breath, -dyspnea on exertion, -wheezing,  Gastroenterology: -abdominal pain, -nausea, -vomiting, -diarrhea, -constipation, -dysphagia Hematology: -bleeding or bruising problems Musculoskeletal: -arthralgias, -myalgias, -joint swelling, -back pain,  - Ophthalmology: -vision changes,  Urology: -dysuria, -difficulty urinating,  -urinary frequency, -urgency, incontinence Neurology: -, -numbness, , -memory loss, -falls, -dizziness    PHYSICAL EXAM:  BP 122/78   Pulse 66   Temp (!) 97.2 F (36.2 C)   Ht 5' 5.5" (1.664 m)   Wt 140 lb 12.8 oz (63.9 kg)   SpO2 97%   BMI 23.07 kg/m   General Appearance: Alert, cooperative, no distress, appears stated age Head: Normocephalic, without obvious abnormality, atraumatic Eyes: PERRL, conjunctiva/corneas clear, EOM's intact,  Ears: Normal TM's and external ear canals Nose: Nares normal, mucosa normal, no drainage or sinus   tenderness Throat: Lips, mucosa, and tongue normal; teeth and gums normal Neck: Supple, no lymphadenopathy, thyroid:no enlargement/tenderness/nodules; no carotid bruit or JVD Lungs: Clear to auscultation bilaterally without wheezes, rales or ronchi; respirations unlabored Heart: Regular rate and rhythm, S1 and S2 normal,2/6 SEM Abdomen: Soft, non-tender, nondistended, normoactive bowel sounds, no masses, no hepatosplenomegaly Extremities: No clubbing, cyanosis or edema Pulses: 2+ and symmetric all extremities Skin: Skin color, texture, turgor normal, no rashes or lesions Lymph nodes: Cervical, supraclavicular, and axillary nodes normal Neurologic: CNII-XII intact, normal strength, sensation and gait; reflexes 2+ and symmetric throughout   Psych: Normal mood, affect, hygiene and grooming  ASSESSMENT/PLAN: Routine general medical examination at a health care facility  Screening for colon cancer - Plan: Cologuard  S/P aortobifemoral bypass surgery  Primary hypertension  S/P aortic valve replacement with bioprosthetic valve  S/P CABG x 1  Bipolar affective disorder in remission ([place]) - Plan: Valproic acid level, Comprehensive metabolic panel  Hyperlipidemia with target LDL less than 130 - Plan: Lipid panel  Coronary artery disease involving coronary bypass  graft of native heart without angina pectoris  PAD (peripheral artery disease) ([facility])  Allergic rhinitis due to pollen, unspecified seasonality  Substance induced mood disorder ([place])  Recovering alcoholic in remission ([place])  Peripheral arterial occlusive disease ([facility])  S/P lumbar spinal fusion  Obsessive-compulsive disorder, unspecified type  Tobacco abuse  OSA (obstructive sleep apnea)  Mild persistent asthma, unspecified whether complicated  Atherosclerosis of aorta ([place])  Mentally disabled He will continue on his present medication regimen and follow-up with psychiatry, cardiology as well as CVTS.  Encouraged him to continue to work on smoking cessation.  He will continue in [place].  I will set him up for Cologuard later this year.  No intervention needed for the OSA as he is not interested in pursuing that any further.  Immunization recommendations discussed.  Colonoscopy recommendations reviewed.   Medicare Attestation I have personally reviewed: The patient's medical and social history Their use of alcohol, tobacco or illicit drugs Their current medications and supplements The patient's functional ability including ADLs,fall risks, home safety risks, cognitive, and hearing and visual impairment Diet and physical activities Evidence for depression or mood disorders  The patient's weight, height, and BMI have been recorded in the chart.  I have made referrals, counseling, and provided education to the patient based on review of the above and I have provided the patient with a written personalized care plan for preventive services.     [name], MD   04/03/2021

## 2021-04-03 NOTE — Patient Instructions (Signed)
  Mr. Gregory [name] , Thank you for taking time to come for your Medicare Wellness Visit. I appreciate your ongoing commitment to your health goals. Please review the following plan we discussed and let me know if I can assist you in the future.   These are the goals we discussed: Goals   None     This is a list of the screening recommended for you and due dates:  Health Maintenance  Topic Date Due  . Flu Shot  07/22/2021  . Cologuard (Stool DNA test)  07/27/2021  . Tetanus Vaccine  10/29/2022  . COVID-19 Vaccine  Completed  .  Hepatitis C: One time screening is recommended by Center for Disease Control  (CDC) for  adults born from [birth year] through 1966.   Completed  . HPV Vaccine  Aged Out  . Pneumonia vaccines  Discontinued

## 2021-04-04 LAB — COMPREHENSIVE METABOLIC PANEL
ALT: 23 IU/L (ref 0–44)
AST: 26 IU/L (ref 0–40)
Albumin/Globulin Ratio: 1.6 (ref 1.2–2.2)
Albumin: 4.6 g/dL (ref 3.8–4.8)
Alkaline Phosphatase: 71 IU/L (ref 44–121)
BUN/Creatinine Ratio: 15 (ref 10–24)
BUN: 16 mg/dL (ref 8–27)
Bilirubin Total: 0.3 mg/dL (ref 0.0–1.2)
CO2: 24 mmol/L (ref 20–29)
Calcium: 9.6 mg/dL (ref 8.6–10.2)
Chloride: 100 mmol/L (ref 96–106)
Creatinine, Ser: 1.07 mg/dL (ref 0.76–1.27)
Globulin, Total: 2.9 g/dL (ref 1.5–4.5)
Glucose: 76 mg/dL (ref 65–99)
Potassium: 4.6 mmol/L (ref 3.5–5.2)
Sodium: 139 mmol/L (ref 134–144)
Total Protein: 7.5 g/dL (ref 6.0–8.5)
eGFR: 75 mL/min/{1.73_m2} (ref >59)

## 2021-04-04 LAB — LIPID PANEL
Chol/HDL Ratio: 3.2 ratio (ref 0.0–5.0)
Cholesterol, Total: 109 mg/dL (ref 100–199)
HDL: 34 mg/dL — ABNORMAL LOW (ref >39)
LDL Chol Calc (NIH): 51 mg/dL (ref 0–99)
Triglycerides: 136 mg/dL (ref 0–149)
VLDL Cholesterol Cal: 24 mg/dL (ref 5–40)

## 2021-04-04 LAB — VALPROIC ACID LEVEL: Valproic Acid Lvl: 30 ug/mL — ABNORMAL LOW (ref 50–100)

## 2021-05-09 ENCOUNTER — Other Ambulatory Visit: Payer: Self-pay | Admitting: Family Medicine

## 2021-05-09 NOTE — Telephone Encounter (Signed)
Is this okay to refill? 

## 2021-05-24 ENCOUNTER — Other Ambulatory Visit: Payer: Self-pay | Admitting: Family Medicine

## 2021-05-24 DIAGNOSIS — R3914 Feeling of incomplete bladder emptying: Secondary | ICD-10-CM

## 2021-05-24 DIAGNOSIS — N401 Enlarged prostate with lower urinary tract symptoms: Secondary | ICD-10-CM

## 2021-05-24 DIAGNOSIS — I10 Essential (primary) hypertension: Secondary | ICD-10-CM

## 2021-07-09 ENCOUNTER — Other Ambulatory Visit: Payer: Self-pay

## 2021-07-09 ENCOUNTER — Telehealth (INDEPENDENT_AMBULATORY_CARE_PROVIDER_SITE_OTHER): Payer: PPO | Admitting: Family Medicine

## 2021-07-09 ENCOUNTER — Encounter: Payer: Self-pay | Admitting: Family Medicine

## 2021-07-09 VITALS — Temp 97.8°F | Wt 140.0 lb

## 2021-07-09 DIAGNOSIS — J019 Acute sinusitis, unspecified: Secondary | ICD-10-CM | POA: Diagnosis not present

## 2021-07-09 MED ORDER — AMOXICILLIN-POT CLAVULANATE 875-125 MG PO TABS: 1.0000 | ORAL_TABLET | Freq: Two times a day (BID) | ORAL | 0 refills | Status: DC

## 2021-07-09 NOTE — Progress Notes (Signed)
   Subjective:    Patient ID: Gregory [name], male    DOB: 08-05-1951, 70 y.o.   MRN: [id number]  HPI Documentation for virtual audio and video telecommunications through [place] encounter: The patient was located at home. 2 patient identifiers used.  The provider was located in the office. The patient did consent to this visit and is aware of possible charges through their insurance for this visit. The other persons participating in this telemedicine service were none. Time spent on call was 5 minutes and in review of previous records >20 minutes total for counseling and coordination of care. This virtual service is not related to other E/M service within previous 7 days.  He complains of a 1 week history of started with slight cough, headache, sore throat, postnasal drainage with sneezing.  He did a COVID test x2 which was negative.  He also complains of upper tooth discomfort as well as a slight hoarse voice but no fever or chills.  Review of Systems     Objective:   Physical Exam Alert and in no distress with a slight hoarse voice.       Assessment & Plan:  Acute sinusitis, recurrence not specified, unspecified location - Plan: amoxicillin-clavulanate (AUGMENTIN) 875-125 MG tablet His symptoms are consistent with a sinus infection and I will go ahead and treat him.  He is to take all the antibiotic and call me if not better when he finishes.

## 2021-07-22 ENCOUNTER — Ambulatory Visit (INDEPENDENT_AMBULATORY_CARE_PROVIDER_SITE_OTHER): Payer: PPO | Admitting: Physician Assistant

## 2021-07-22 ENCOUNTER — Ambulatory Visit (HOSPITAL_COMMUNITY)
Admission: RE | Admit: 2021-07-22 | Discharge: 2021-07-22 | Disposition: A | Discharge status: Home / Self Care | Payer: PPO | Source: Ambulatory Visit | Attending: Physician Assistant | Admitting: Physician Assistant

## 2021-07-22 ENCOUNTER — Other Ambulatory Visit: Payer: Self-pay

## 2021-07-22 VITALS — BP 122/59 | HR 56 | Temp 97.3°F | Resp 16 | Ht 65.5 in | Wt 139.0 lb

## 2021-07-22 DIAGNOSIS — I779 Disorder of arteries and arterioles, unspecified: Secondary | ICD-10-CM | POA: Diagnosis not present

## 2021-07-22 DIAGNOSIS — Z95828 Presence of other vascular implants and grafts: Secondary | ICD-10-CM

## 2021-07-22 NOTE — Progress Notes (Signed)
Peripheral Arterial Disease Follow-Up   VASCULAR SURGERY ASSESSMENT & PLAN:   Gregory [name] is a 70 y.o. male who is status post aortobifemoral bypass on October 03, 2020 by Dr. Trula Slade.  He had postoperative difficulty with left groin seroma requiring I and D.  This had resolved at his last follow-up.  Aortoiliac occlusive disease with severe claudication s/p aortobifemoral bypass.  Patient is without claudication or rest pain.  ABIs WNL. OK to use LLE compression sock. Continue optimal medical management of hypertension and follow-up with primary care physician. Encouraged complete smoking cessation. Continue the following medications: aspirin and statin Follow-up in one year with ABIs.  SUBJECTIVE:   The patient denies lower extremity pain with exercise or rest pain.  Denies skin loss or ulceration. Some residual inner thightskin numbness and left lower leg edema. He exercises regularly. PHYSICAL EXAM:   Vitals:   07/22/21 1328  BP: (!) 122/59  Pulse: (!) 56  Resp: 16  Temp: (!) 97.3 F (36.3 C)  SpO2: 97%     General appearance: Well-developed, well-nourished in no apparent distress Neurologic: Alert and oriented x 4. Cardiovascular: Heart rate and rhythm are regular.   Abdomen: No palpable pulsatile mass. Extremities: Skin intact.  Both feet are warm and well perfused.  Motor function and sensation intact Pulse exam: 2+ femoral, dorsalis pedis, posterior tibial pulses bilaterally   NON-INVASIVE VASCULAR STUDIES  07/22/2021 ABIs ABI/TBIToday's ABIToday's TBIPrevious ABIPrevious TBI  +-------+-----------+-----------+------------+------------+  Right  1.08       0.74       0.6         0.39          +-------+-----------+-----------+------------+------------+  Left   0.94       0.49       0.57        0.26          +-------+-----------+-----------+------------+------------+  Right TP = 105. Waveforms are triphasic Left TP = 69. Waveforms are  biphasic     Summary:  Right: Resting right ankle-brachial index is within normal range. No  evidence of significant right lower extremity arterial disease. The right  toe-brachial index is normal.   Left: Resting left ankle-brachial index indicates mild left lower  extremity arterial disease. Borderline normal range at 0.94. The left  toe-brachial index is abnormal.     PROBLEM LIST:    The patient's past medical history, past surgical history, family history, social history, allergy list and medication list are reviewed. He is s/p aortic valve replacement and CABG.  CURRENT MEDS:    Current Outpatient Medications:    acetaminophen (TYLENOL) 325 MG tablet, Take 2 tablets (650 mg total) by mouth every 6 (six) hours as needed for mild pain., Disp:  , Rfl:    albuterol (VENTOLIN HFA) 108 (90 Base) MCG/ACT inhaler, INHALE TWO PUFFS BY MOUTH EVERY 6 HOURS AS NEEDED FOR WHEEZING, Disp: 8.5 g, Rfl: 1   amLODipine (NORVASC) 10 MG tablet, TAKE ONE TABLET BY MOUTH DAILY, Disp: 90 tablet, Rfl: 0   amoxicillin-clavulanate (AUGMENTIN) 875-125 MG tablet, Take 1 tablet by mouth 2 (two) times daily., Disp: 20 tablet, Rfl: 0   aspirin EC 325 MG EC tablet, Take 1 tablet (325 mg total) by mouth daily., Disp: 30 tablet, Rfl: 0   atorvastatin (LIPITOR) 80 MG tablet, TAKE ONE TABLET BY MOUTH DAILY (Patient taking differently: Take 80 mg by mouth daily.), Disp: 90 tablet, Rfl: 3   divalproex (DEPAKOTE) 500 MG DR tablet, Take 1,000-1,500 mg by  mouth at bedtime. , Disp: , Rfl:    finasteride (PROSCAR) 5 MG tablet, TAKE ONE TABLET BY MOUTH DAILY, Disp: 90 tablet, Rfl: 0   fluticasone (FLONASE) 50 MCG/ACT nasal spray, SPRAY 2 SPRAYS INTO THE AFFECTED NOSTRIL DAILY (Patient taking differently: Place 2 sprays into both nostrils daily. SPRAY 2 SPRAYS INTO THE AFFECTED NOSTRIL DAILY), Disp: 48 mL, Rfl: 10   Fluticasone-Salmeterol (ADVAIR DISKUS) 500-50 MCG/DOSE AEPB, Inhale 1 puff into the lungs every 12 (twelve)  hours., Disp: 180 each, Rfl: 3   guaiFENesin (MUCINEX) 600 MG 12 hr tablet, Take by mouth 2 (two) times daily., Disp: , Rfl:    lamoTRIgine (LAMICTAL) 200 MG tablet, Take 1 tablet (200 mg total) by mouth every morning., Disp: 90 tablet, Rfl: 3   lisinopril-hydrochlorothiazide (ZESTORETIC) 20-12.5 MG tablet, TAKE ONE TABLET BY MOUTH EVERY MORNING, Disp: 90 tablet, Rfl: 0   Multiple Vitamins-Minerals (MULTIVITAMIN ADULTS 50+ PO), Take 1 tablet by mouth daily. , Disp: , Rfl:    REVIEW OF SYSTEMS:   '[X]'$  denotes positive finding, '[ ]'$  denotes negative finding Cardiac  Comments:  Chest pain or chest pressure:    Shortness of breath upon exertion:    Short of breath when lying flat:    Irregular heart rhythm:        Vascular    Pain in calf, thigh, or hip brought on by ambulation:    Pain in feet at night that wakes you up from your sleep:     Blood clot in your veins:    Leg swelling:         Pulmonary    Oxygen at home:    Productive cough:     Wheezing:         Neurologic    Sudden weakness in arms or legs:     Sudden numbness in arms or legs:     Sudden onset of difficulty speaking or slurred speech:    Temporary loss of vision in one eye:     Problems with dizziness:         Gastrointestinal    Blood in stool:     Vomited blood:         Genitourinary    Burning when urinating:     Blood in urine:        Psychiatric    Major depression:         Hematologic    Bleeding problems:    Problems with blood clotting too easily:        Skin    Rashes or ulcers:        Constitutional    Fever or chills:     Barbie Banner, PA-C  Office: [phone] 07/22/2021

## 2021-08-04 ENCOUNTER — Other Ambulatory Visit: Payer: Self-pay | Admitting: Family Medicine

## 2021-08-08 ENCOUNTER — Encounter: Payer: Self-pay | Admitting: Family Medicine

## 2021-08-08 ENCOUNTER — Other Ambulatory Visit: Payer: Self-pay

## 2021-08-08 ENCOUNTER — Telehealth (INDEPENDENT_AMBULATORY_CARE_PROVIDER_SITE_OTHER): Payer: PPO | Admitting: Family Medicine

## 2021-08-08 VITALS — Temp 97.4°F | Wt 137.1 lb

## 2021-08-08 DIAGNOSIS — J029 Acute pharyngitis, unspecified: Secondary | ICD-10-CM | POA: Diagnosis not present

## 2021-08-08 DIAGNOSIS — R0989 Other specified symptoms and signs involving the circulatory and respiratory systems: Secondary | ICD-10-CM | POA: Diagnosis not present

## 2021-08-08 NOTE — Progress Notes (Signed)
   Subjective:    Patient ID: Gregory [name], male    DOB: 06/02/1951, 70 y.o.   MRN: [id number]  HPI Documentation for virtual audio and video telecommunications through [facility] encounter: The patient was located at home. 2 patient identifiers used.  The provider was located in the office. The patient did consent to this visit and is aware of possible charges through their insurance for this visit. The other persons participating in this telemedicine service were none. Time spent on call was 5 minutes and in review of previous records >15 minutes total for counseling and coordination of care. This virtual service is not related to other E/M service within previous 7 days.  He states that last Friday he developed symptoms sneezing, nasal congestion, slight sore throat with sinus headache.  The sore throat got worse on Monday and he developed some chest congestion with some slight shortness of breath and dizziness with some nausea.  He has had 4 COVID vaccines and did test for COVID twice with the second 1 being yesterday and was negative.  He does have underlying allergies and usually uses Benadryl for this.  Review of Systems     Objective:   Physical Exam Alert and in no distress.  His voice sounds normal.       Assessment & Plan:  Sore throat  Chest congestion Recommend he is add Claritin to his regimen and also try Emetrol for the nausea.  Explained that hopefully in the next day or so he will turn the corner and get better if not, he is to call me and I will probably place him on an antibiotic.  He was comfortable with that.

## 2021-08-22 ENCOUNTER — Other Ambulatory Visit: Payer: Self-pay | Admitting: Family Medicine

## 2021-08-22 DIAGNOSIS — I10 Essential (primary) hypertension: Secondary | ICD-10-CM

## 2021-08-23 ENCOUNTER — Telehealth (INDEPENDENT_AMBULATORY_CARE_PROVIDER_SITE_OTHER): Payer: PPO | Admitting: Family Medicine

## 2021-08-23 ENCOUNTER — Other Ambulatory Visit: Payer: Self-pay

## 2021-08-23 ENCOUNTER — Encounter: Payer: Self-pay | Admitting: Family Medicine

## 2021-08-23 VITALS — BP 138/78 | Temp 98.2°F | Ht 65.5 in | Wt 138.2 lb

## 2021-08-23 DIAGNOSIS — J3489 Other specified disorders of nose and nasal sinuses: Secondary | ICD-10-CM | POA: Diagnosis not present

## 2021-08-23 DIAGNOSIS — J309 Allergic rhinitis, unspecified: Secondary | ICD-10-CM

## 2021-08-23 MED ORDER — PREDNISONE 10 MG (21) PO TBPK: ORAL_TABLET | ORAL | 0 refills | Status: DC

## 2021-08-23 NOTE — Patient Instructions (Signed)
Drink plenty of water. Change to once daily antihistamine (claritin or allegra or zyrtec), and you likely can stop the benadryl.  Consider doing sinus rinses with NeilMed Rinse Kit, once or twice daily, using distilled water.  Take Guaifenesin throughout the day--consider changing to long-acting Mucinex 12 hour, and take twice daily (plain, not D or DM).  If symptoms aren't improving with these measures, start the steroid course. If you develop any fever or discolored mucus (yellow-green), then contact us for an antibiotic.

## 2021-08-23 NOTE — Progress Notes (Signed)
Start time: 10:22 End time: 10:45  Virtual Visit via Video Note  I connected with Gregory [name] on 08/23/21 by a video enabled telemedicine application and verified that I am speaking with the correct person using two identifiers.  Location: Patient: home Provider: office   I discussed the limitations of evaluation and management by telemedicine and the availability of in person appointments. The patient expressed understanding and agreed to proceed.  History of Present Illness:  Chief Complaint  Patient presents with   Facial Pain    VIRTUAL sinus pain and pressure. Saw JCL in July and August and really hasn't gotten better, seemed some better when he was on abx-but now worsening. Over the last week or so he feels like his mouth is white and burning. Off and on coughing, sort of productive. Mornings are the worst. Has some dizziness. Mucus has been clear. Last covid test Wed night and was negative.   Patient presents with persistent sinus pain and pressure.   Sinus infections usually start out with allergy flares. He reports using proventil (before yardwork and exercise, hasn't need as rescue), wears a mask when he is doing yardwork. Notes a little white on the soft palate (behind teeth, like where you'd burn roof of mouth with pizza)--doesn't look like thrush, which he has had. He hasn't use Advair in a while, always rinses mouth after.  Last saw JCL 8/18--told possibly viral, and to contact him if worse. Since then, it has gradually gotten worse.   His current complaints are postnasal drip, L>R ear discomfort described as throbbing, not plugging/popping. Nasal drainage is clear, large amounts. Cough is also productive of clear phlegm. He has discomfort in the gums, pressure in both cheeks and across the forehead. He has slight nausea, he thinks related to the dizziness.  Trouble focusing, concentrating. Didn't get claritin as recommended at last visit by Dr. Redmond School, but takes  benadryl (at bedtime). Uses short-acting mucinex prn, only for chest congestion, which helps.  He used to do sinus rinses, but stopped after his grandchild died from brain-eating amoeba.    PMH, PSH, SH reviewed He reports being recovering alcoholic, concerned about prescribed meds. Also has bipolar d/o. Reports he has done fine with prednisone in the past, not affecting moods/sleep.  Outpatient Encounter Medications as of 08/23/2021  Medication Sig Note   acetaminophen (TYLENOL) 325 MG tablet Take 2 tablets (650 mg total) by mouth every 6 (six) hours as needed for mild pain.    albuterol (VENTOLIN HFA) 108 (90 Base) MCG/ACT inhaler INHALE TWO PUFFS BY MOUTH EVERY 6 HOURS AS NEEDED FOR WHEEZING    amLODipine (NORVASC) 10 MG tablet TAKE ONE TABLET BY MOUTH DAILY    aspirin EC 325 MG EC tablet Take 1 tablet (325 mg total) by mouth daily.    atorvastatin (LIPITOR) 80 MG tablet TAKE ONE TABLET BY MOUTH DAILY (Patient taking differently: Take 80 mg by mouth daily.)    divalproex (DEPAKOTE) 500 MG DR tablet Take 1,000-1,500 mg by mouth at bedtime.     finasteride (PROSCAR) 5 MG tablet TAKE ONE TABLET BY MOUTH DAILY    fluticasone (FLONASE) 50 MCG/ACT nasal spray SPRAY TWO SPRAYS IN THE AFFECTED NOSTRIL DAILY    guaiFENesin 200 MG tablet Take 200 mg by mouth every 4 (four) hours as needed for cough or to loosen phlegm. 08/23/2021: Last dose 8pm   lamoTRIgine (LAMICTAL) 200 MG tablet Take 1 tablet (200 mg total) by mouth every morning.    lisinopril-hydrochlorothiazide (ZESTORETIC)  20-12.5 MG tablet TAKE ONE TABLET BY MOUTH EVERY MORNING    Multiple Vitamins-Minerals (MULTIVITAMIN ADULTS 50+ PO) Take 1 tablet by mouth daily.     Fluticasone-Salmeterol (ADVAIR DISKUS) 500-50 MCG/DOSE AEPB Inhale 1 puff into the lungs every 12 (twelve) hours. (Patient not taking: Reported on 08/23/2021)    [DISCONTINUED] guaiFENesin (MUCINEX) 600 MG 12 hr tablet Take by mouth 2 (two) times daily.    No  facility-administered encounter medications on file as of 08/23/2021.   Allergies  Allergen Reactions   Codeine Anaphylaxis   ROS: No fever, has had some chills. URI symptoms per HPI. Denies shortness of breath, purulent drainage, n/v/d, myalgias or other complaints. Moods are well controlled. See HPI   Observations/Objective:  BP 138/78   Temp 98.2 F (36.8 C) (Temporal)   Ht 5' 5.5" (1.664 m)   Wt 138 lb 3.2 oz (62.7 kg)   BMI 22.65 kg/m   Pleasant, well-appearing male, in no distress. He is alert, oriented. He is speaking easily, no coughing, doesn't sound congested, no sniffling. Cranial nerves are grossly intact. Exam is limited due to virtual nature of the visit.   Assessment and Plan:  Allergic rhinitis, unspecified seasonality, unspecified trigger - causing sinus pressure, PND. no e/o infection. Add claritin, cont flonase (reviewed proper technique), use guaifenesin regularly - Plan: predniSONE (STERAPRED UNI-PAK 21 TAB) 10 MG (21) TBPK tablet  Sinus pressure - no e/o sinus infection at this time. To contact us if develops discolored mucus or phlegm  Claritin or zyrtec or allegra, and stop benadryl. Guaifenesin round the clock   Consider sinus rinses using distilled water. Sterapred pak to pharmacy, to use if not better. Risks/side effects reviewed (specifically with respect to bipolar, effects on mood). S/sx of sinusitis reviewed--to contact us for ABX if discolored, fever.    Follow Up Instructions:    I discussed the assessment and treatment plan with the patient. The patient was provided an opportunity to ask questions and all were answered. The patient agreed with the plan and demonstrated an understanding of the instructions.   The patient was advised to call back or seek an in-person evaluation if the symptoms worsen or if the condition fails to improve as anticipated.  I spent 25 minutes dedicated to the care of this patient, including pre-visit review  of records, face to face time, post-visit ordering of testing and documentation.    Vikki Ports, MD

## 2021-09-08 ENCOUNTER — Other Ambulatory Visit: Payer: Self-pay | Admitting: Family Medicine

## 2021-09-08 DIAGNOSIS — N401 Enlarged prostate with lower urinary tract symptoms: Secondary | ICD-10-CM

## 2021-09-08 DIAGNOSIS — R3914 Feeling of incomplete bladder emptying: Secondary | ICD-10-CM

## 2021-09-09 DIAGNOSIS — H2513 Age-related nuclear cataract, bilateral: Secondary | ICD-10-CM | POA: Diagnosis not present

## 2021-09-09 DIAGNOSIS — H2512 Age-related nuclear cataract, left eye: Secondary | ICD-10-CM | POA: Diagnosis not present

## 2021-09-09 DIAGNOSIS — H524 Presbyopia: Secondary | ICD-10-CM | POA: Diagnosis not present

## 2021-09-09 DIAGNOSIS — H35342 Macular cyst, hole, or pseudohole, left eye: Secondary | ICD-10-CM | POA: Diagnosis not present

## 2021-09-09 DIAGNOSIS — H52213 Irregular astigmatism, bilateral: Secondary | ICD-10-CM | POA: Diagnosis not present

## 2021-09-09 DIAGNOSIS — H25013 Cortical age-related cataract, bilateral: Secondary | ICD-10-CM | POA: Diagnosis not present

## 2021-09-09 DIAGNOSIS — H35372 Puckering of macula, left eye: Secondary | ICD-10-CM | POA: Diagnosis not present

## 2021-09-12 ENCOUNTER — Other Ambulatory Visit: Payer: Self-pay

## 2021-09-12 ENCOUNTER — Telehealth: Payer: Self-pay | Admitting: Family Medicine

## 2021-09-12 DIAGNOSIS — Z1211 Encounter for screening for malignant neoplasm of colon: Secondary | ICD-10-CM

## 2021-09-12 NOTE — Chronic Care Management (AMB) (Signed)
  Chronic Care Management   Outreach Note  09/12/2021 Name: ITAY MELLA MRN: [id number] DOB: [birth year]  Referred by: [name], MD Reason for referral : No chief complaint on file.   An unsuccessful telephone outreach was attempted today. The patient was referred to the pharmacist for assistance with care management and care coordination.   Follow Up Plan:   Tatjana Dellinger Upstream Scheduler

## 2021-09-13 ENCOUNTER — Encounter (INDEPENDENT_AMBULATORY_CARE_PROVIDER_SITE_OTHER): Payer: PPO | Admitting: Ophthalmology

## 2021-09-13 ENCOUNTER — Other Ambulatory Visit: Payer: Self-pay

## 2021-09-13 DIAGNOSIS — H35342 Macular cyst, hole, or pseudohole, left eye: Secondary | ICD-10-CM | POA: Diagnosis not present

## 2021-09-13 DIAGNOSIS — H35371 Puckering of macula, right eye: Secondary | ICD-10-CM

## 2021-09-13 DIAGNOSIS — H43813 Vitreous degeneration, bilateral: Secondary | ICD-10-CM | POA: Diagnosis not present

## 2021-09-13 DIAGNOSIS — I10 Essential (primary) hypertension: Secondary | ICD-10-CM

## 2021-09-13 DIAGNOSIS — H35033 Hypertensive retinopathy, bilateral: Secondary | ICD-10-CM

## 2021-09-13 DIAGNOSIS — H2513 Age-related nuclear cataract, bilateral: Secondary | ICD-10-CM

## 2021-09-18 ENCOUNTER — Telehealth: Payer: Self-pay | Admitting: Family Medicine

## 2021-09-18 NOTE — Progress Notes (Signed)
  Chronic Care Management   Note  09/18/2021 Name: Gregory [name] MRN: [id number] DOB: 12-08-1951  Gregory [name] is a 70 y.o. year old male who is a primary care patient of Redmond School, Elyse Jarvis, MD. I reached out to [name] by phone today in response to a referral sent by Mr. Gregory [name]'s PCP, [name], MD.   Gregory [name] was given information about Chronic Care Management services today including:  CCM service includes personalized support from designated clinical staff supervised by his physician, including individualized plan of care and coordination with other care providers 24/7 contact phone numbers for assistance for urgent and routine care needs. Service will only be billed when office clinical staff spend 20 minutes or more in a month to coordinate care. Only one practitioner may furnish and bill the service in a calendar month. The patient may stop CCM services at any time (effective at the end of the month) by phone call to the office staff.   Patient agreed to services and verbal consent obtained.   Follow up plan:   Tatjana Secretary/administrator

## 2021-09-24 DIAGNOSIS — H2512 Age-related nuclear cataract, left eye: Secondary | ICD-10-CM | POA: Diagnosis not present

## 2021-09-24 DIAGNOSIS — H25812 Combined forms of age-related cataract, left eye: Secondary | ICD-10-CM | POA: Diagnosis not present

## 2021-10-06 ENCOUNTER — Other Ambulatory Visit: Payer: Self-pay | Admitting: Family Medicine

## 2021-10-06 DIAGNOSIS — I10 Essential (primary) hypertension: Secondary | ICD-10-CM

## 2021-10-07 ENCOUNTER — Other Ambulatory Visit: Payer: Self-pay

## 2021-10-07 ENCOUNTER — Ambulatory Visit (INDEPENDENT_AMBULATORY_CARE_PROVIDER_SITE_OTHER): Payer: PPO

## 2021-10-07 ENCOUNTER — Telehealth: Payer: Self-pay | Admitting: Family Medicine

## 2021-10-07 DIAGNOSIS — I10 Essential (primary) hypertension: Secondary | ICD-10-CM

## 2021-10-07 DIAGNOSIS — Z23 Encounter for immunization: Secondary | ICD-10-CM

## 2021-10-07 MED ORDER — LISINOPRIL-HYDROCHLOROTHIAZIDE 20-12.5 MG PO TABS: 1.0000 | ORAL_TABLET | Freq: Every morning | ORAL | 2 refills | Status: DC

## 2021-10-07 NOTE — Addendum Note (Signed)
Addended by: [name] on: 10/07/2021 04:40 PM   Modules accepted: Orders

## 2021-10-07 NOTE — Telephone Encounter (Signed)
Pt going out of town and needs refill on Lisinopril to [facility].

## 2021-10-21 DIAGNOSIS — H2511 Age-related nuclear cataract, right eye: Secondary | ICD-10-CM | POA: Diagnosis not present

## 2021-10-21 DIAGNOSIS — Z1211 Encounter for screening for malignant neoplasm of colon: Secondary | ICD-10-CM | POA: Diagnosis not present

## 2021-10-21 DIAGNOSIS — H25011 Cortical age-related cataract, right eye: Secondary | ICD-10-CM | POA: Diagnosis not present

## 2021-10-26 LAB — COLOGUARD: COLOGUARD: POSITIVE — AB

## 2021-10-28 ENCOUNTER — Telehealth: Payer: Self-pay

## 2021-10-28 ENCOUNTER — Other Ambulatory Visit: Payer: Self-pay

## 2021-10-28 DIAGNOSIS — R195 Other fecal abnormalities: Secondary | ICD-10-CM

## 2021-10-28 NOTE — Telephone Encounter (Signed)
Pt. Called stating his cologuard result was positive and needs referral to GI  for colonoscopy. He said he wanted to use the GI on [address] across form [facility].

## 2021-10-28 NOTE — Telephone Encounter (Signed)
Done [facility] 

## 2021-10-29 DIAGNOSIS — H25011 Cortical age-related cataract, right eye: Secondary | ICD-10-CM | POA: Diagnosis not present

## 2021-10-29 DIAGNOSIS — H2511 Age-related nuclear cataract, right eye: Secondary | ICD-10-CM | POA: Diagnosis not present

## 2021-10-29 DIAGNOSIS — H25811 Combined forms of age-related cataract, right eye: Secondary | ICD-10-CM | POA: Diagnosis not present

## 2021-11-03 IMAGING — DX DG CHEST 2V
2 series · 2 of 2 positions shown · non-contrast
Comparison: December 12, 2019.

CLINICAL DATA: Status post coronary bypass graft.

EXAM:
CHEST - 2 VIEW

[dg chest 2 view (1 of 2)]
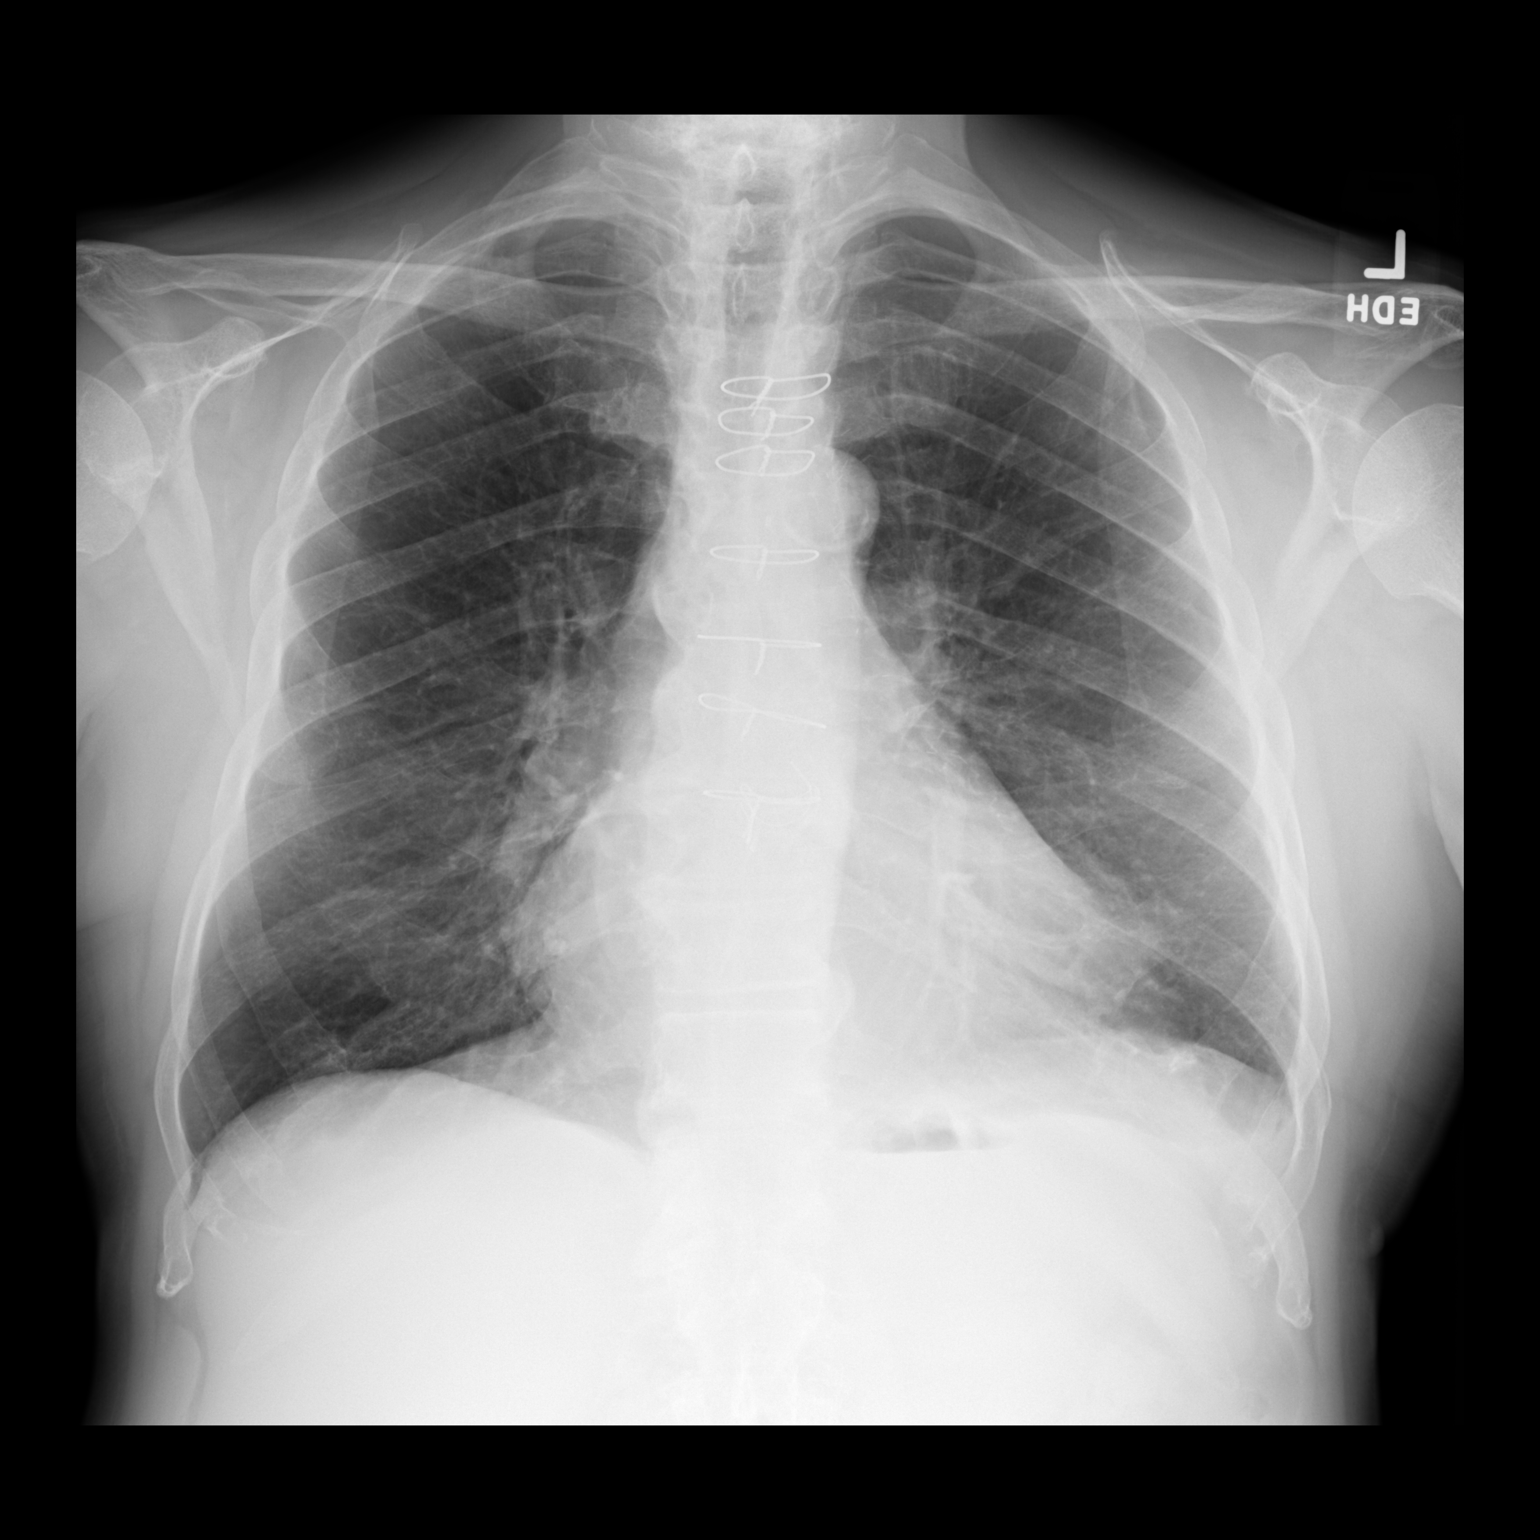

[dg chest 2 view (2 of 2)]
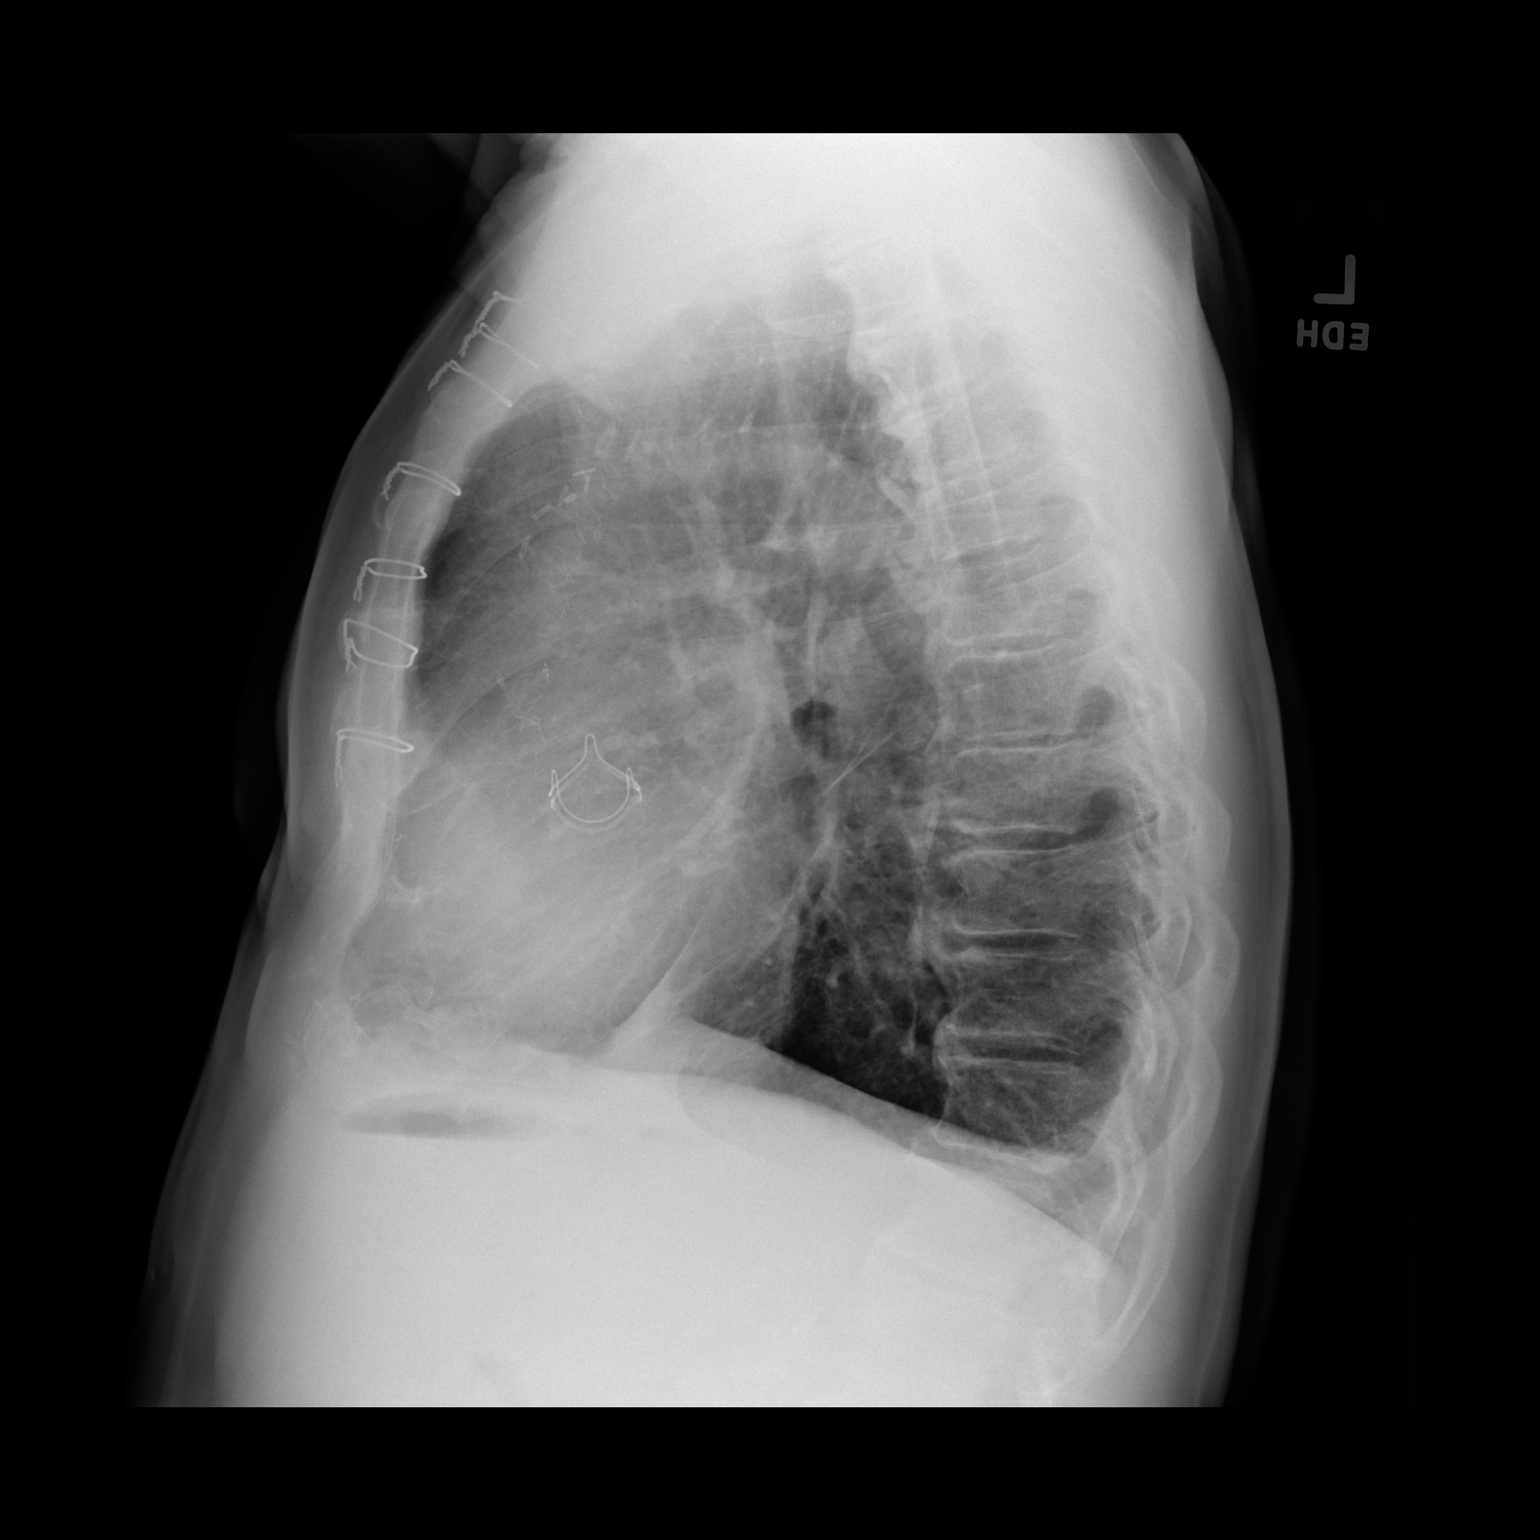

[2 of 2 positions shown; findings below may reference images not displayed]

FINDINGS: The heart size and mediastinal contours are within normal limits.
Status post aortic valve repair. No pneumothorax or pleural effusion
is noted. Both lungs are clear. The visualized skeletal structures
are unremarkable.
IMPRESSION: No active cardiopulmonary disease.

## 2021-11-04 ENCOUNTER — Telehealth: Payer: Self-pay | Admitting: Pharmacist

## 2021-11-04 NOTE — Chronic Care Management (AMB) (Signed)
Chronic Care Management Pharmacy Assistant   Name: Gregory [name]  MRN: [id number] DOB: 1951/04/15  Reason for Encounter: Chart review for initial encounter with Gregory [name] Clinical Pharmacist via telephone on 11/17 at 1:30.   Conditions to be addressed/monitored: CAD, HTN, HLD, Asthma, Allergic Rhinitis, and Peripheral neuropathy   Recent office visits:  08/23/21 Gregory Ohara, MD - Patient presented via video visit for Allergic rhinitis and other concerns. Prescribed prednisone 10 mg. And Guaifenesin 200 mg every 4 hours PRN.  08/08/21 Gregory Lung, MD - Patient presented via video visit for sore throat and other concerns. Recommended to try Claritin and Emetrol.   07/09/21  Gregory Lung, MD - Patient presented via video visit for acute sinusitis recurrence not specified. Prescribed Amoxicillin-Pot Clavulanate 875-125 mg two times daily  Recent consult visits:  09/09/21 Gregory [name] (Ophthalmology) - Patient presented for Benson and other concerns no other details available.  07/22/21 Setzer, Edman Circle, PA-C - (Vasc Surg) - Patient presented for Peripheral arterial occlusive disease and other concerns. No medication changes.  Hospital visits:  None in previous 6 months  Medications: Outpatient Encounter Medications as of 11/04/2021  Medication Sig Note   acetaminophen (TYLENOL) 325 MG tablet Take 2 tablets (650 mg total) by mouth every 6 (six) hours as needed for mild pain.    albuterol (VENTOLIN HFA) 108 (90 Base) MCG/ACT inhaler INHALE TWO PUFFS BY MOUTH EVERY 6 HOURS AS NEEDED FOR WHEEZING    amLODipine (NORVASC) 10 MG tablet TAKE ONE TABLET BY MOUTH DAILY    aspirin EC 325 MG EC tablet Take 1 tablet (325 mg total) by mouth daily.    atorvastatin (LIPITOR) 80 MG tablet TAKE ONE TABLET BY MOUTH DAILY (Patient taking differently: Take 80 mg by mouth daily.)    divalproex (DEPAKOTE) 500 MG DR tablet Take 1,000-1,500 mg by mouth at bedtime.     finasteride  (PROSCAR) 5 MG tablet TAKE ONE TABLET BY MOUTH DAILY    fluticasone (FLONASE) 50 MCG/ACT nasal spray SPRAY TWO SPRAYS IN THE AFFECTED NOSTRIL DAILY    Fluticasone-Salmeterol (ADVAIR DISKUS) 500-50 MCG/DOSE AEPB Inhale 1 puff into the lungs every 12 (twelve) hours. (Patient not taking: Reported on 08/23/2021)    guaiFENesin 200 MG tablet Take 200 mg by mouth every 4 (four) hours as needed for cough or to loosen phlegm. 08/23/2021: Last dose 8pm   lamoTRIgine (LAMICTAL) 200 MG tablet Take 1 tablet (200 mg total) by mouth every morning.    lisinopril-hydrochlorothiazide (ZESTORETIC) 20-12.5 MG tablet Take 1 tablet by mouth every morning.    Multiple Vitamins-Minerals (MULTIVITAMIN ADULTS 50+ PO) Take 1 tablet by mouth daily.     predniSONE (STERAPRED UNI-PAK 21 TAB) 10 MG (21) TBPK tablet Take as directed    No facility-administered encounter medications on file as of 11/04/2021.  Fill History : AMLODIPINE BESYLATE 10MG  TABLET 08/22/2021 90   ATORVASTATIN CALCIUM 80MG  TABLET 08/22/2021 90   DIVALPROEX SODIUM ER 500MG  TABLET EXTENDED RELEASE 24 HOUR 10/05/2021 90   FINASTERIDE 5MG  TABLET 09/09/2021 90   FLUTICASONE PROPIONATE 50MCG/ACT SUSPENSION 05/09/2021 90   FLUTICASONE PROPIONATE/SALMETEROL DISKUS 500MCG/DOSE / 50MCG/DOSE AEROSOL POWDER BREATH ACTIVATED 06/03/2019 90   LAMOTRIGINE 200MG  TABLET 10/05/2021 90   LISINOPRIL/HYDROCHLOROTHIAZIDE TABLET 10/07/2021 90   PREDNISONE 10MG  TABLET THERAPY PACK 08/23/2021 6   Have you seen any other providers since your last visit? Patient reports other than his eye doctor whom he has had cataract surgery recently with no. He reports he has  a tele visit follow up with him on tomorrow.  Any changes in your medications or health? Patient reports just eye drops for his eyes were added recently.  Any side effects from any medications? Patient reports nicotine patches cause a rash but he expected as much and nicotine gum if you eat too much at once makes  him dizzy/nauseated.  Do you have an symptoms or problems not managed by your medications? Patient reports his finasteride he thinks is helping a lot but he is still up during the night using the restroom.  Any concerns about your health right now? Patient reports that he has recently had a positive colorectal screening and has to have a colonoscopy he has to go to see gastro for colonoscopy and has reached out to them for an appointment but has not yet heard back to schedule and is making him anxious.  Has your provider asked that you check blood pressure, blood sugar, or follow special diet at home? Patient reports that he does not have a blood pressure cuff at home he ha is pressure checked often at appointments and if he does not have one he does it at [employer] once or twice a week. He does not eat much red meat eats a lot of beans, full fat butter and cheese, veggies, fish, chicken and [place].  Do you get any type of exercise on a regular basis? Patient report he goes to the gym 2-3 times a week does 3 miles on elliptical a week, wears a fit bit and gets [phone] steps a day has been waiting for clearance after eye surg to return to seated weights at the gym also.  Can you think of a goal you would like to reach for your health? Patient reports he is still working on stopping smoking.  Do you have any problems getting your medications? Patient reports he is happy with his current pharmacy and the cost of his medications. He reports the only one that costs a little more are his inhalers.  Is there anything that you would like to discuss during the appointment? Patient reports none.  Patient assistance for the following mediations: None  Patient aware to have available medications, blood pressure readings and supplements for call.  Care Gaps: BP - 138/78 (08/23/21) PNA Vaccines - Overdue AWV- 4/23  Star Rating Drugs: Atorvastatin (Lipitor) 80 MG - Last filled 08/22/21 90 DS at [facility] Lisinopril- HCTZ (Zestoretic) -  Last filled 10/07/21 90 DS at [facility] Pharmacist Assistant [phone]

## 2021-11-04 NOTE — Progress Notes (Signed)
A user error has taken place: encounter opened in error, closed for administrative reasons.

## 2021-11-06 ENCOUNTER — Telehealth: Payer: Self-pay | Admitting: Pharmacist

## 2021-11-06 NOTE — Chronic Care Management (AMB) (Signed)
    Chronic Care Management Pharmacy Assistant   Name: Gregory [name]  MRN: [id number] DOB: [birth year]  11/06/21 APPOINTMENT REMINDER   Patient was reminded to have all medications, supplements and any blood glucose and blood pressure readings available for review with [name], Pharm. D, for telephone visit on 11/07/21 at 1:30.    Care Gaps: BP - 138/78 (08/23/21) PNA Vaccines - Overdue AWV- 4/23   Star Rating Drugs: Atorvastatin (Lipitor) 80 MG - Last filled 08/22/21 90 DS at [facility] Lisinopril- HCTZ (Zestoretic) -  Last filled 10/07/21 90 DS at [facility]     Medications: Outpatient Encounter Medications as of 11/06/2021  Medication Sig Note   acetaminophen (TYLENOL) 325 MG tablet Take 2 tablets (650 mg total) by mouth every 6 (six) hours as needed for mild pain.    albuterol (VENTOLIN HFA) 108 (90 Base) MCG/ACT inhaler INHALE TWO PUFFS BY MOUTH EVERY 6 HOURS AS NEEDED FOR WHEEZING    amLODipine (NORVASC) 10 MG tablet TAKE ONE TABLET BY MOUTH DAILY    aspirin EC 325 MG EC tablet Take 1 tablet (325 mg total) by mouth daily.    atorvastatin (LIPITOR) 80 MG tablet TAKE ONE TABLET BY MOUTH DAILY (Patient taking differently: Take 80 mg by mouth daily.)    divalproex (DEPAKOTE) 500 MG DR tablet Take 1,000-1,500 mg by mouth at bedtime.     finasteride (PROSCAR) 5 MG tablet TAKE ONE TABLET BY MOUTH DAILY    fluticasone (FLONASE) 50 MCG/ACT nasal spray SPRAY TWO SPRAYS IN THE AFFECTED NOSTRIL DAILY    Fluticasone-Salmeterol (ADVAIR DISKUS) 500-50 MCG/DOSE AEPB Inhale 1 puff into the lungs every 12 (twelve) hours. (Patient not taking: Reported on 08/23/2021)    guaiFENesin 200 MG tablet Take 200 mg by mouth every 4 (four) hours as needed for cough or to loosen phlegm. 08/23/2021: Last dose 8pm   lamoTRIgine (LAMICTAL) 200 MG tablet Take 1 tablet (200 mg total) by mouth every morning.    lisinopril-hydrochlorothiazide (ZESTORETIC) 20-12.5 MG tablet Take 1 tablet by mouth every  morning.    Multiple Vitamins-Minerals (MULTIVITAMIN ADULTS 50+ PO) Take 1 tablet by mouth daily.     predniSONE (STERAPRED UNI-PAK 21 TAB) 10 MG (21) TBPK tablet Take as directed    No facility-administered encounter medications on file as of 11/06/2021.    [place] Clinical Pharmacist Assistant [phone]

## 2021-11-07 ENCOUNTER — Ambulatory Visit (INDEPENDENT_AMBULATORY_CARE_PROVIDER_SITE_OTHER): Payer: PPO | Admitting: Pharmacist

## 2021-11-07 ENCOUNTER — Encounter: Payer: Self-pay | Admitting: Gastroenterology

## 2021-11-07 DIAGNOSIS — I10 Essential (primary) hypertension: Secondary | ICD-10-CM

## 2021-11-07 NOTE — Progress Notes (Signed)
Chronic Care Management Pharmacy Note  11/17/2021 Name:  Gregory [name] MRN:  [id number] DOB:  29-Aug-1951  Summary: BP not ideally at goal < 130/80 Pt continues to smoke but is working on quitting  Recommendations/Changes made from today's visit: -Recommended trial of nicotine lozenges 4 mg to avoid having to chew gum -Recommended moving finasteride to bedtime to help with nocturia -Reached out to [facility] about colonoscopy scheduling -Recommend switching Advair and albuterol to Symbicort for scheduled and PRN use -Recommend decreasing aspirin to 81 mg daily  Plan: Follow up in 4 months   Subjective: Gregory [name] is an 70 y.o. year old male who is a primary patient of [name], MD.  The CCM team was consulted for assistance with disease management and care coordination needs.    Engaged with patient by telephone for initial visit in response to provider referral for pharmacy case management and/or care coordination services.   Consent to Services:  The patient was given the following information about Chronic Care Management services today, agreed to services, and gave verbal consent: 1. CCM service includes personalized support from designated clinical staff supervised by the primary care provider, including individualized plan of care and coordination with other care providers 2. 24/7 contact phone numbers for assistance for urgent and routine care needs. 3. Service will only be billed when office clinical staff spend 20 minutes or more in a month to coordinate care. 4. Only one practitioner may furnish and bill the service in a calendar month. 5.The patient may stop CCM services at any time (effective at the end of the month) by phone call to the office staff. 6. The patient will be responsible for cost sharing (co-pay) of up to 20% of the service fee (after annual deductible is met). Patient agreed to services and consent obtained.  Patient Care Team: [name], MD as PCP - General (Family Medicine) [name], [name], MD as PCP - Cardiology (Cardiology) Viona Gilmore, [facility] as Pharmacist (Pharmacist)  Recent office visits: 08/23/21 Rita Ohara, MD - Patient presented via video visit for Allergic rhinitis and other concerns. Prescribed prednisone 10 mg. And Guaifenesin 200 mg every 4 hours PRN.   08/08/21 [name], MD - Patient presented via video visit for sore throat and other concerns. Recommended to try Claritin and Emetrol.    07/09/21  [name], MD - Patient presented via video visit for acute sinusitis recurrence not specified. Prescribed Amoxicillin-Pot Clavulanate 875-125 mg two times daily.  Recent consult visits: 09/09/21 Monna Fam (Ophthalmology) - Patient presented for PR CORNEAL TOPOGRAPHY and other concerns no other details available.   07/22/21 Setzer, Edman Circle, PA-C - (Vasc Surg) - Patient presented for Peripheral arterial occlusive disease and other concerns. No medication changes.  Hospital visits: None in previous 6 months   Objective:  Lab Results  Component Value Date   CREATININE 1.07 04/03/2021   BUN 16 04/03/2021   GFRNONAA >60 01/02/2021   GFRAA 98 04/16/2020   NA 139 04/03/2021   K 4.6 04/03/2021   CALCIUM 9.6 04/03/2021   CO2 24 04/03/2021   GLUCOSE 76 04/03/2021    Lab Results  Component Value Date/Time   HGBA1C 5.9 (H) 10/04/2020 03:32 AM   HGBA1C 5.9 (H) 11/22/2019 11:45 AM    Last diabetic Eye exam: No results found for: HMDIABEYEEXA  Last diabetic Foot exam: No results found for: HMDIABFOOTEX   Lab Results  Component Value Date   CHOL 109  04/03/2021   HDL 34 (L) 04/03/2021   LDLCALC 51 04/03/2021   TRIG 136 04/03/2021   CHOLHDL 3.2 04/03/2021    Hepatic Function Latest Ref Rng & Units 04/03/2021 10/05/2020 10/04/2020  Total Protein 6.0 - 8.5 g/dL 7.5 5.4(L) 4.8(L)  Albumin 3.8 - 4.8 g/dL 4.6 3.1(L) 2.9(L)  AST 0 - 40 IU/L 26 64(H) 38  ALT 0 - 44 IU/L 23 30 19   Alk  Phosphatase 44 - 121 IU/L 71 30(L) 25(L)  Total Bilirubin 0.0 - 1.2 mg/dL 0.3 0.4 0.5  Bilirubin, Direct 0.00 - 0.40 mg/dL - - -      CBC Latest Ref Rng & Units 01/02/2021 10/17/2020 10/09/2020  WBC 4.0 - 10.5 K/uL 5.9 - 7.1  Hemoglobin 13.0 - 17.0 g/dL 13.0 9.9(L) 8.7(L)  Hematocrit 39.0 - 52.0 % 39.9 29.0(L) 26.2(L)  Platelets 150 - 400 K/uL 200 - 159    No results found for: VD25OH  Clinical ASCVD: Yes  The ASCVD Risk score (Arnett DK, et al., 2019) failed to calculate for the following reasons:   The valid total cholesterol range is 130 to 320 mg/dL    Depression screen [facility] 2/9 04/03/2021 03/22/2020 01/10/2020  Decreased Interest 0 0 0  Down, Depressed, Hopeless 0 0 0  PHQ - 2 Score 0 0 0  Some recent data might be hidden      Social History   Tobacco Use  Smoking Status Every Day   Packs/day: 0.50   Years: 25.00   Pack years: 12.50   Types: Cigarettes  Smokeless Tobacco Never  Tobacco Comments   Pt had stopped smoking X10 years/ currently smoking a pack a day    BP Readings from Last 3 Encounters:  08/23/21 138/78  07/22/21 (!) 122/59  04/03/21 122/78   Pulse Readings from Last 3 Encounters:  07/22/21 (!) 56  04/03/21 66  03/25/21 (!) 52   Wt Readings from Last 3 Encounters:  11/13/21 138 lb (62.6 kg)  08/23/21 138 lb 3.2 oz (62.7 kg)  08/08/21 137 lb 1.6 oz (62.2 kg)   BMI Readings from Last 3 Encounters:  11/13/21 22.62 kg/m  08/23/21 22.65 kg/m  08/08/21 22.47 kg/m    Assessment/Interventions: Review of patient past medical history, allergies, medications, health status, including review of consultants reports, laboratory and other test data, was performed as part of comprehensive evaluation and provision of chronic care management services.   SDOH:  (Social Determinants of Health) assessments and interventions performed: Yes SDOH Interventions    Flowsheet Row Most Recent Value  SDOH Interventions   Financial Strain Interventions Intervention  Not Indicated  Transportation Interventions Intervention Not Indicated      SDOH Screenings   Alcohol Screen: Not on file  Depression (PHQ2-9): Low Risk    PHQ-2 Score: 0  Financial Resource Strain: Low Risk    Difficulty of Paying Living Expenses: Not hard at all  Food Insecurity: Not on file  Housing: Not on file  Physical Activity: Not on file  Social Connections: Not on file  Stress: Not on file  Tobacco Use: High Risk   Smoking Tobacco Use: Every Day   Smokeless Tobacco Use: Never   Passive Exposure: Not on file  Transportation Needs: No Transportation Needs   Lack of Transportation (Medical): No   Lack of Transportation (Non-Medical): No   Patient usually gets up around 7 am each morning. He has a cat and dog and they usually wake him up during the night. He is bothered by frequent  nighttime awakenings to use the bathroom or by the animals.   He uses the gym 2-3 times a week and does a variety of exercise while he is there. Right now, he is mostly doing 3 miles on the elliptical and is not doing any weight lifting as he isn't sure when to resume post surgery.  Right now, his biggest concern has been with failing his cologuard screening and he is worried about this. He has tried to reach out to gastro to schedule but has not heard back.   Patient is not sleeping well and never gotten used to the mask. Patient's cat wakes him up during the night. He only sleeps 3-4 hours at a time. He falls asleep in front of the TV often. He is a side sleeper and doesn't even use the CPAP anymore. He does not follow up with pulmonary.   CCM Care Plan  Allergies  Allergen Reactions   Codeine Anaphylaxis    Medications Reviewed Today     Reviewed by Elyse Jarvis, [place] (Registered Medical Assistant) on 11/13/21 at 1008  Med List Status: <None>   Medication Order Taking? Sig Documenting Provider Last Dose Status Informant  acetaminophen (TYLENOL) 325 MG tablet [id number] Yes Take 2  tablets (650 mg total) by mouth every 6 (six) hours as needed for mild pain. Barrett, Lodema Hong, PA-C Taking Active Self  albuterol (VENTOLIN HFA) 108 (90 Base) MCG/ACT inhaler [id number] Yes INHALE TWO PUFFS BY MOUTH EVERY 6 HOURS AS NEEDED FOR WHEEZING [name], MD Taking Active   amLODipine (NORVASC) 10 MG tablet [id number] Yes TAKE ONE TABLET BY MOUTH DAILY [name], MD Taking Active   aspirin EC 325 MG EC tablet [id number] Yes Take 1 tablet (325 mg total) by mouth daily. Barrett, Lodema Hong, PA-C Taking Active Self  atorvastatin (LIPITOR) 80 MG tablet [id number] Yes TAKE ONE TABLET BY MOUTH DAILY  Patient taking differently: Take 80 mg by mouth daily.   Lorretta Harp, MD Taking Active Self  divalproex (DEPAKOTE) 500 MG DR tablet [id number] Yes Take 1,000 mg by mouth at bedtime. Chucky May, MD Taking Active Self  finasteride (PROSCAR) 5 MG tablet [id number] Yes TAKE ONE TABLET BY MOUTH DAILY [name], MD Taking Active   fluticasone [facility]) 50 MCG/ACT nasal spray [id number] Yes SPRAY TWO SPRAYS IN THE AFFECTED NOSTRIL DAILY [name], MD Taking Active   Fluticasone-Salmeterol (ADVAIR DISKUS) 500-50 MCG/DOSE AEPB [id number] Yes Inhale 1 puff into the lungs every 12 (twelve) hours. [name], MD Taking Active Self  guaiFENesin 200 MG tablet [id number] Yes Take 200 mg by mouth every 4 (four) hours as needed for cough or to loosen phlegm. [provider] Taking Active            Med Note Tamala Julian, Renold Genta   Fri Aug 23, 2021  8:07 AM) Last dose 8pm  lamoTRIgine (LAMICTAL) 200 MG tablet [id number] Yes Take 1 tablet (200 mg total) by mouth every morning. [name], MD Taking Active Self  lisinopril-hydrochlorothiazide (ZESTORETIC) 20-12.5 MG tablet [id number] Yes Take 1 tablet by mouth every morning. [name], MD Taking Active   Multiple Vitamins-Minerals (MULTIVITAMIN ADULTS 50+ PO) [id number] Yes Take 1 tablet by mouth daily.  [provider] Taking  Active Self  nicotine (NICODERM CQ - DOSED IN MG/24 HOURS) 21 mg/24hr patch [id number] Yes Place 21 mg onto the skin daily. [provider] Taking Active   Med List Note Mills Koller, [facility] 10/16/13  69): Pt's wife states pt has been non-compliant will all his medications. Pt claims to be taking meds regularly            Patient Active Problem List   Diagnosis Date Noted   S/P aortobifemoral bypass surgery 04/03/2021   S/P CABG x 1 11/24/2019   S/P aortic valve replacement with bioprosthetic valve 11/24/2019   PAD (peripheral artery disease) (HCC)    Mild persistent asthma 10/29/2019   Herpes zoster without complication 83/38/2505   RBBB (right bundle branch block with left anterior fascicular block)    Peripheral neuropathy    S/P lumbar spinal fusion 10/25/2014   Recovering alcoholic in remission ([place]) 08/10/2014   Substance induced mood disorder ([place]) 06/30/2014   Substance-induced sleep disorder ([place]) 06/30/2014   Obsessive compulsive disorder    OSA (obstructive sleep apnea) 01/20/2014   CAD (coronary artery disease)    Mentally disabled 11/25/2012   Hyperlipidemia with target LDL less than 130 11/25/2012   Bipolar disorder ([place])    Hypertension    Allergic rhinitis due to pollen 08/11/2011   Tobacco abuse     Immunization History  Administered Date(s) Administered   Fluad Quad(high Dose 65+) 10/07/2021   Influenza Split 09/28/2012, 10/19/2014, 09/17/2015   Influenza Whole 10/08/2010   Influenza, High Dose Seasonal PF 10/22/2017, 10/12/2018, 08/24/2019   Influenza-Unspecified 09/21/2013, 10/09/2016, 10/22/2017   PFIZER(Purple Top)SARS-COV-2 Vaccination 01/30/2020, 02/24/2020, 11/11/2020   Pfizer Covid-19 Vaccine Bivalent Booster 49yr & up 10/07/2021   Pneumococcal Conjugate-13 01/31/2016   Pneumococcal Polysaccharide-23 08/30/2013   Tdap 10/29/2012   Zoster Recombinat (Shingrix) 04/23/2017, 07/19/2017    Conditions to be addressed/monitored:   Hypertension, Hyperlipidemia, Coronary Artery Disease, Asthma, Tobacco use, Allergic Rhinitis, and Bipolar disorder  Care Plan : [place] Updates made by PViona Gilmore [place] 11/17/2021 12:00 AM     Problem: Problem: Hypertension, Hyperlipidemia, Coronary Artery Disease, Asthma, Tobacco use, Allergic Rhinitis, and Bipolar disorder      Long-Range Goal: Patient-Specific Goal   Start Date: 11/07/2021  Expected End Date: 11/07/2022  This Visit's Progress: On track  Priority: High  Note:   Current Barriers:  Unable to independently monitor therapeutic efficacy  Pharmacist Clinical Goal(s):  Patient will achieve adherence to monitoring guidelines and medication adherence to achieve therapeutic efficacy through collaboration with PharmD and provider.   Interventions: 1:1 collaboration with LDenita Lung MD regarding development and update of comprehensive plan of care as evidenced by provider attestation and co-signature Inter-disciplinary care team collaboration (see longitudinal plan of care) Comprehensive medication review performed; medication list updated in electronic medical record  Hypertension (BP goal <130/80) -Not ideally controlled -Current treatment: Amlodipine 10 mg 1 tablet daily Lisinopril-HCTZ 20-12.5 mg 1 tablet daily -Medications previously tried: n/a  -Current home readings: 134/68 (130s/60s) HR 51-52 - [facility] -Current dietary habits: tries to limit salt intake - cocoaminos, stir fried foods; not eating out often - not a lot of prepared foods - rinsing foods off -Current exercise habits: going to the gym 2-3 times a week -Denies hypotensive/hypertensive symptoms -Educated on BP goals and benefits of medications for prevention of heart attack, stroke and kidney damage; Importance of home blood pressure monitoring; Proper BP monitoring technique; -Counseled to monitor BP at home at least weekly,  document, and provide log at future appointments -Counseled on diet and exercise extensively Recommended to continue current medication  Hyperlipidemia: (LDL goal < 70) -Controlled -Current treatment: Atorvastatin 80  mg 1 tablet daily -Medications previously tried: none  -Current dietary patterns: no fried foods; red meat once a week; vegetarian a couple times a week; lots of fiber and lots of vegetables -Current exercise habits: gym 2-3 times a week -Educated on Cholesterol goals;  Benefits of statin for ASCVD risk reduction; Importance of limiting foods high in cholesterol; -Counseled on diet and exercise extensively Recommended to continue current medication  CAD (Goal: prevent heart events) -Not ideally controlled -Current treatment  Atorvastatin 80 mg 1 tablet daily Aspirin 325 mg 1 tablet daily -Medications previously tried: none  -Counseled on risk vs benefits of high dose aspirin therapy.   Asthma  (Goal: control symptoms) -Not ideally controlled -Current treatment  Advair 500-50 mcg/dose 1 puff every 12 hours - not using it once the season is over Albuterol HFA 2 puffs every 6 hours as needed -Medications previously tried: Dentist (in hospital)   -Pulmonary function testing: n/a -Exacerbations requiring treatment in last 6 months: none -Patient denies consistent use of maintenance inhaler -Frequency of rescue inhaler use: prior to exercise -Counseled on Proper inhaler technique; -Recommended switching Advair to Symbicort for maintenance and rescue inhaler.  Tobacco use (Goal quit smoking) -Uncontrolled -Previous quit attempts: patches -Current treatment  Nicotine gum 4 mg as needed -Patient smokes Within 30 minutes of waking -Patient triggers include: drinking coffee -On a scale of 1-10, reports MOTIVATION to quit is 8 -On a scale of 1-10, reports CONFIDENCE in quitting is 8 -Counseled to allow lozenge to dissolve and absorb in cheek pocket, rather than  swallow, to reduce GI side effects. Provided contact information for [place] [facility]uit Line (1-800-QUIT-NOW) and encouraged patient to reach out to this group for support. -Recommended switching gum to lozenge  Bipolar disorder (Goal: minimize symptoms) -Controlled -Current treatment: Lamotrigine 200 mg 1 tablet every morning Divalproex 500 mg 1000  mg at bedtime -Medications previously tried/failed: unknown -PHQ9: 0 -GAD7: n/a -Educated on Benefits of medication for symptom control Benefits of cognitive-behavioral therapy with or without medication -Recommended to continue current medication   BPH (Goal: minimize symptoms of enlarged prostate) -Uncontrolled -Current treatment  Finasteride 5 mg 1 tablet daily - morning -Medications previously tried: none  -Recommended moving to bedtime to see if this helps with nocturia.  Health Maintenance -Vaccine gaps: Prevnar 20 or Pneumovax 23 -Current therapy:  Multivitamin 1 tablet daily Guaifenesin 200 mg as needed (coughing more with the cat) Flonase 50 mcg/act 2 sprays every morning Acetaminophen as needed -Educated on Cost vs benefit of each product must be carefully weighed by individual consumer -Patient is satisfied with current therapy and denies issues -Recommended to continue current medication  Patient Goals/Self-Care Activities Patient will:  - check blood pressure at least weekly, document, and provide at future appointments target a minimum of 150 minutes of moderate intensity exercise weekly  Follow Up Plan: Telephone follow up appointment with care management team member scheduled for: 4 months       Medication Assistance: None required.  Patient affirms current coverage meets needs.  Compliance/Adherence/Medication fill history: Care Gaps: Prevnar 20 or PPSV 23 BP - 138/78 (08/23/21)  Star-Rating Drugs: Atorvastatin (Lipitor) 80 MG - Last filled 08/22/21 90 DS at [facility] Lisinopril- HCTZ (Zestoretic) -  Last filled  10/07/21 90 DS at [facility]  Patient's preferred pharmacy is:  [facility] [place], [place] [address] Phone: [phone] Fax: [phone]  [id number] - [place], [place] -  [address] [place] [address] Phone: [phone] Fax: [phone]  Uses pill box? Yes - 2 weeks in advance Pt endorses 99% compliance - other than inhalers  We discussed: Current pharmacy is preferred with insurance plan and patient is satisfied with pharmacy services Patient decided to: Continue current medication management strategy  Care Plan and Follow Up Patient Decision:  Patient agrees to Care Plan and Follow-up.  Plan: Telephone follow up appointment with care management team member scheduled for:  4 months  [name], PharmD, [place] [phone]

## 2021-11-07 NOTE — Patient Instructions (Addendum)
Hi Gregory [name],  It was great to get to meet you over the telephone! Below is a summary of some of the topics we discussed.  Go ahead and try to take the finasteride in the evening as we discussed, to see if this helps.  Don't forget to get your pneumonia vaccine as well as soon as you'd like.  Please reach out to me if you have any questions or need anything before our follow up!  Best, Maddie  [name], PharmD, [place] [phone]   Visit Information   Goals Addressed   None    Patient Care Plan: CCM Pharmacy Care Plan     Problem Identified: Problem: Hypertension, Hyperlipidemia, Coronary Artery Disease, Asthma, Tobacco use, Allergic Rhinitis, and Bipolar disorder      Long-Range Goal: Patient-Specific Goal   Start Date: 11/07/2021  Expected End Date: 11/07/2022  This Visit's Progress: On track  Priority: High  Note:   Current Barriers:  Unable to independently monitor therapeutic efficacy  Pharmacist Clinical Goal(s):  Patient will achieve adherence to monitoring guidelines and medication adherence to achieve therapeutic efficacy through collaboration with PharmD and provider.   Interventions: 1:1 collaboration with [name], MD regarding development and update of comprehensive plan of care as evidenced by provider attestation and co-signature Inter-disciplinary care team collaboration (see longitudinal plan of care) Comprehensive medication review performed; medication list updated in electronic medical record  Hypertension (BP goal <130/80) -Not ideally controlled -Current treatment: Amlodipine 10 mg 1 tablet daily Lisinopril-HCTZ 20-12.5 mg 1 tablet daily -Medications previously tried: n/a  -Current home readings: 134/68 (130s/60s) HR 51-52 - checking a few times a week at [employer]  -Current dietary habits: tries to limit salt intake - cocoaminos, stir fried foods; not eating out often - not a lot of  prepared foods - rinsing foods off -Current exercise habits: going to the gym 2-3 times a week -Denies hypotensive/hypertensive symptoms -Educated on BP goals and benefits of medications for prevention of heart attack, stroke and kidney damage; Importance of home blood pressure monitoring; Proper BP monitoring technique; -Counseled to monitor BP at home at least weekly, document, and provide log at future appointments -Counseled on diet and exercise extensively Recommended to continue current medication  Hyperlipidemia: (LDL goal < 70) -Controlled -Current treatment: Atorvastatin 80 mg 1 tablet daily -Medications previously tried: none  -Current dietary patterns: no fried foods; red meat once a week; vegetarian a couple times a week; lots of fiber and lots of vegetables -Current exercise habits: gym 2-3 times a week -Educated on Cholesterol goals;  Benefits of statin for ASCVD risk reduction; Importance of limiting foods high in cholesterol; -Counseled on diet and exercise extensively Recommended to continue current medication  CAD (Goal: prevent heart events) -Not ideally controlled -Current treatment  Atorvastatin 80 mg 1 tablet daily Aspirin 325 mg 1 tablet daily -Medications previously tried: none  -Counseled on risk vs benefits of high dose aspirin therapy.   Asthma  (Goal: control symptoms) -Not ideally controlled -Current treatment  Advair 500-50 mcg/dose 1 puff every 12 hours - not using it once the season is over Albuterol HFA 2 puffs every 6 hours as needed -Medications previously tried: Dentist (in hospital)   -Pulmonary function testing: n/a -Exacerbations requiring treatment in last 6 months: none -Patient denies consistent use of maintenance inhaler -Frequency of rescue inhaler use: prior to exercise -Counseled on Proper inhaler technique; -Recommended switching Advair to Symbicort for maintenance and rescue inhaler.  Tobacco use (  Goal quit  smoking) -Uncontrolled -Previous quit attempts: patches -Current treatment  Nicotine gum 4 mg as needed -Patient smokes Within 30 minutes of waking -Patient triggers include: drinking coffee -On a scale of 1-10, reports MOTIVATION to quit is 8 -On a scale of 1-10, reports CONFIDENCE in quitting is 8 -Counseled to allow lozenge to dissolve and absorb in cheek pocket, rather than swallow, to reduce GI side effects. Provided contact information for [facility]uit [facility] (1-800-QUIT-NOW) and encouraged patient to reach out to this group for support. -Recommended switching gum to lozenge  Bipolar disorder (Goal: minimize symptoms) -Controlled -Current treatment: Lamotrigine 200 mg 1 tablet every morning Divalproex 500 mg 1000  mg at bedtime -Medications previously tried/failed: unknown -PHQ9: 0 -GAD7: n/a -Educated on Benefits of medication for symptom control Benefits of cognitive-behavioral therapy with or without medication -Recommended to continue current medication   BPH (Goal: minimize symptoms of enlarged prostate) -Uncontrolled -Current treatment  Finasteride 5 mg 1 tablet daily - morning -Medications previously tried: none  -Recommended moving to bedtime to see if this helps with nocturia.  Health Maintenance -Vaccine gaps: Prevnar 20 or Pneumovax 23 -Current therapy:  Multivitamin 1 tablet daily Guaifenesin 200 mg as needed (coughing more with the cat) Flonase 50 mcg/act 2 sprays every morning Acetaminophen as needed -Educated on Cost vs benefit of each product must be carefully weighed by individual consumer -Patient is satisfied with current therapy and denies issues -Recommended to continue current medication  Patient Goals/Self-Care Activities Patient will:  - check blood pressure at least weekly, document, and provide at future appointments target a minimum of 150 minutes of moderate intensity exercise weekly  Follow Up Plan: Telephone follow up appointment with care  management team member scheduled for: 4 months      Gregory [name] was given information about Chronic Care Management services today including:  CCM service includes personalized support from designated clinical staff supervised by his physician, including individualized plan of care and coordination with other care providers 24/7 contact phone numbers for assistance for urgent and routine care needs. Standard insurance, coinsurance, copays and deductibles apply for chronic care management only during months in which we provide at least 20 minutes of these services. Most insurances cover these services at 100%, however patients may be responsible for any copay, coinsurance and/or deductible if applicable. This service may help you avoid the need for more expensive face-to-face services. Only one practitioner may furnish and bill the service in a calendar month. The patient may stop CCM services at any time (effective at the end of the month) by phone call to the office staff.  Patient agreed to services and verbal consent obtained.   Patient verbalizes understanding of instructions provided today and agrees to view in [place].  Telephone follow up appointment with pharmacy team member scheduled for: 4 months  Gregory [name], [facility]

## 2021-11-08 DIAGNOSIS — F17200 Nicotine dependence, unspecified, uncomplicated: Secondary | ICD-10-CM | POA: Diagnosis not present

## 2021-11-08 DIAGNOSIS — I10 Essential (primary) hypertension: Secondary | ICD-10-CM | POA: Diagnosis not present

## 2021-11-11 ENCOUNTER — Telehealth: Payer: Self-pay | Admitting: Family Medicine

## 2021-11-11 NOTE — Telephone Encounter (Signed)
Pt called states that he talked to the pharmacy nurse last week and she states that he needs the pneumonia shot he would like to come in for that is if possible

## 2021-11-12 NOTE — Telephone Encounter (Signed)
LVM advising pt that he does not need this vaccine . Gregory [name]

## 2021-11-13 ENCOUNTER — Telehealth (INDEPENDENT_AMBULATORY_CARE_PROVIDER_SITE_OTHER): Payer: PPO | Admitting: Family Medicine

## 2021-11-13 ENCOUNTER — Encounter: Payer: Self-pay | Admitting: Family Medicine

## 2021-11-13 ENCOUNTER — Other Ambulatory Visit: Payer: Self-pay

## 2021-11-13 VITALS — Temp 98.2°F | Wt 138.0 lb

## 2021-11-13 DIAGNOSIS — J111 Influenza due to unidentified influenza virus with other respiratory manifestations: Secondary | ICD-10-CM

## 2021-11-13 NOTE — Progress Notes (Signed)
   Subjective:    Patient ID: Gregory [name], male    DOB: 1951-11-01, 70 y.o.   MRN: [id number]  HPI Documentation for virtual audio and video telecommunications through [place] encounter: The patient was located at home. 2 patient identifiers used.  The provider was located in the office. The patient did consent to this visit and is aware of possible charges through their insurance for this visit. The other persons participating in this telemedicine service were none. Time spent on call was 5 minutes and in review of previous records >13 minutes total for counseling and coordination of care. This virtual service is not related to other E/M service within previous 7 days.  He states that he has a several day history of sneezing, dry cough, slight sore throat but no fever, chills, earache.  He did COVID test yesterday which was negative.  Review of Systems     Objective:   Physical Exam Alert and in no distress was slightly gravelly voice.       Assessment & Plan:   Flu syndrome I explained that at this point it sounds like it is a flu type disease and recommend supportive care including Tylenol as well as Robitussin-DM for the coughing.  Explained that an antibiotic would not be useful.  Also he is to avoid being around other people especially if they have immune compromised system.

## 2021-11-19 ENCOUNTER — Telehealth: Payer: Self-pay | Admitting: Family Medicine

## 2021-11-19 ENCOUNTER — Telehealth (INDEPENDENT_AMBULATORY_CARE_PROVIDER_SITE_OTHER): Payer: PPO | Admitting: Family Medicine

## 2021-11-19 ENCOUNTER — Encounter: Payer: Self-pay | Admitting: Family Medicine

## 2021-11-19 VITALS — Temp 97.8°F | Wt 141.0 lb

## 2021-11-19 DIAGNOSIS — J019 Acute sinusitis, unspecified: Secondary | ICD-10-CM | POA: Diagnosis not present

## 2021-11-19 MED ORDER — AMOXICILLIN-POT CLAVULANATE 875-125 MG PO TABS: 1.0000 | ORAL_TABLET | Freq: Two times a day (BID) | ORAL | 0 refills | Status: DC

## 2021-11-19 NOTE — Telephone Encounter (Signed)
Called pt to advised. [place]

## 2021-11-19 NOTE — Progress Notes (Signed)
   Subjective:    Patient ID: Gregory [name], male    DOB: 02-10-1951, 70 y.o.   MRN: [id number]  HPI Documentation for virtual audio and video telecommunications through [place] encounter: The patient was located at home. 2 patient identifiers used.  The provider was located in the office. The patient did consent to this visit and is aware of possible charges through their insurance for this visit. The other persons participating in this telemedicine service were none. Time spent on call was 4 minutes and in review of previous records >17 minutes total for counseling and coordination of care. This virtual service is not related to other E/M service within previous 7 days.  He states that he has a 1 week history of started with sore throat followed by cough, earache, postnasal drainage, dizziness with maxillary sinus pressure and upper tooth discomfort.  This is been slowly getting worse over the last several days.  He has a previous history of sinusitis and thinks it is the same thing.  Review of Systems     Objective:   Physical Exam Alert and in no distress otherwise not examined       Assessment & Plan:  Acute sinusitis, recurrence not specified, unspecified location - Plan: amoxicillin-clavulanate (AUGMENTIN) 875-125 MG tablet He will call if not entirely better when he finishes the antibiotic.

## 2021-11-19 NOTE — Telephone Encounter (Signed)
Pt called and states that he still not feeling good, still coughing really bad a lot of drainage,pain around the eyes and going down the teeth and ears, and of a lot of sinus pressure, he wants to know if  there is something that can be sent in, Pt uses [facility] [id number] [address], [address]

## 2021-11-20 DIAGNOSIS — E785 Hyperlipidemia, unspecified: Secondary | ICD-10-CM | POA: Diagnosis not present

## 2021-11-20 DIAGNOSIS — I10 Essential (primary) hypertension: Secondary | ICD-10-CM | POA: Diagnosis not present

## 2021-11-20 DIAGNOSIS — I2510 Atherosclerotic heart disease of native coronary artery without angina pectoris: Secondary | ICD-10-CM | POA: Diagnosis not present

## 2021-11-25 ENCOUNTER — Telehealth: Payer: Self-pay

## 2021-11-25 NOTE — Telephone Encounter (Signed)
Pt. Called stating that he is confused because he received another stool kit for [employer] from the [place] [facility]. He stats he already did one through use about 1 month ago which was positive and has already been setup for a colonoscopy for that one. So wasn't sure why he received another stool kit to do. If you could let him know what is going on with that.

## 2021-11-27 ENCOUNTER — Telehealth: Payer: Self-pay

## 2021-11-27 MED ORDER — CLARITHROMYCIN ER 500 MG PO TB24: 1000.0000 mg | ORAL_TABLET | Freq: Every day | ORAL | 0 refills | Status: DC

## 2021-11-27 NOTE — Telephone Encounter (Signed)
Pt. Called stating that he saw you recently for sinus issues and drainage. He stated it is still about the same not getting any better. Still has a lot of drainage going down his throat, coughing, and still feels foggy and ears have fluid in them. He said you called in augmentin and he is about done with it and still no improvement. He wanted to know if you could call in something different.

## 2021-12-02 ENCOUNTER — Other Ambulatory Visit: Payer: Self-pay

## 2021-12-02 MED ORDER — ATORVASTATIN CALCIUM 80 MG PO TABS: 80.0000 mg | ORAL_TABLET | Freq: Every day | ORAL | 0 refills | Status: DC

## 2021-12-05 ENCOUNTER — Telehealth: Payer: Self-pay | Admitting: Pharmacist

## 2021-12-05 NOTE — Telephone Encounter (Signed)
Called patient to follow up after CCM visit after discussion with PCP. Left a voicemail and requested a call back. Will plan to call next week if patient does not call back.

## 2021-12-07 ENCOUNTER — Other Ambulatory Visit: Payer: Self-pay | Admitting: Family Medicine

## 2021-12-07 DIAGNOSIS — R3914 Feeling of incomplete bladder emptying: Secondary | ICD-10-CM

## 2021-12-07 DIAGNOSIS — N401 Enlarged prostate with lower urinary tract symptoms: Secondary | ICD-10-CM

## 2021-12-10 ENCOUNTER — Other Ambulatory Visit: Payer: Self-pay

## 2021-12-10 ENCOUNTER — Encounter: Payer: Self-pay | Admitting: Family Medicine

## 2021-12-10 ENCOUNTER — Ambulatory Visit (INDEPENDENT_AMBULATORY_CARE_PROVIDER_SITE_OTHER): Payer: PPO | Admitting: Family Medicine

## 2021-12-10 VITALS — BP 140/76 | HR 68 | Temp 97.7°F | Wt 138.8 lb

## 2021-12-10 DIAGNOSIS — J019 Acute sinusitis, unspecified: Secondary | ICD-10-CM | POA: Diagnosis not present

## 2021-12-10 NOTE — Progress Notes (Signed)
° °  Subjective:    Patient ID: Gregory [name], male    DOB: [birth year], 70 y.o.   MRN: [id number]  HPI He is here for a recheck.  He was given Augmentin for sinus infection which apparently was not successful and switched to Biaxin on December 7.  He states that he got better but in the last couple days he is noted increased difficulty with left-sided frontal sinus pain, dizziness and left leg and arm weakness.  The left leg and arm weakness is gone away and the frontal pain is less of an issue but he is also still having difficulty with dizziness.  He cannot relate this to any particular head position.  There is much less prevalent than it was a day or so ago.   Review of Systems     Objective:   Physical Exam Alert and in no distress.  No real palpable frontal sinus or maxillary sinus pain.  Tympanic membranes and canals are normal. Pharyngeal area is normal. Neck is supple without adenopathy or thyromegaly. Cardiac exam shows a regular sinus rhythm without murmurs or gallops. Lungs are clear to auscultation.        Assessment & Plan:  Acute sinusitis, recurrence not specified, unspecified location - Plan: CT MAXILLOFACIAL WO CONTRAST, CANCELED: CT MAXILLOFACIAL LTD WO CM He has been on 2 good antibiotics for this and there is a question of whether this is truly a sinus infection.  If the CT is nondiagnostic I will refer to ENT.  Also instructed him to be aware of the dizziness in regard to head position etc.

## 2021-12-10 NOTE — Telephone Encounter (Signed)
Called patient back as he called and left me a voicemail. Unable to get in touch with him. Will try again tomorrow.

## 2021-12-12 NOTE — Telephone Encounter (Signed)
Called patient again today and was unable to get in touch with him. Left a voicemail and requested a call back.

## 2021-12-26 ENCOUNTER — Other Ambulatory Visit: Payer: Self-pay

## 2021-12-26 ENCOUNTER — Ambulatory Visit (AMBULATORY_SURGERY_CENTER): Payer: PPO

## 2021-12-26 VITALS — Ht 65.5 in | Wt 140.0 lb

## 2021-12-26 DIAGNOSIS — Z1211 Encounter for screening for malignant neoplasm of colon: Secondary | ICD-10-CM

## 2021-12-26 MED ORDER — PEG 3350-KCL-NA BICARB-NACL 420 G PO SOLR: 4000.0000 mL | Freq: Once | ORAL | 0 refills | Status: AC

## 2021-12-26 NOTE — Progress Notes (Signed)
No egg or soy allergy known to patient  No issues known to pt with past sedation with any surgeries or procedures Patient denies ever being told they had issues or difficulty with intubation  No FH of Malignant Hyperthermia Pt is not on diet pills Pt is not on home 02  Pt is not on blood thinners  Pt denies issues with constipation -occasionally- eats a high fiber diet, "traveling affects my system"- patient advised to increase fluids,activity, and intake of veggies/fruits No A fib or A flutter Pt is fully vaccinated for Covid x 2; NO PA's for preps discussed with pt in [place] today  Discussed with pt there will be an out-of-pocket cost for prep and that varies from $0 to 70 + dollars - pt verbalized understanding  Due to the COVID-19 pandemic we are asking patients to follow certain guidelines in PV and the [place]   Pt aware of COVID protocols and [facility] guidelines

## 2021-12-31 DIAGNOSIS — F17200 Nicotine dependence, unspecified, uncomplicated: Secondary | ICD-10-CM | POA: Diagnosis not present

## 2021-12-31 DIAGNOSIS — F319 Bipolar disorder, unspecified: Secondary | ICD-10-CM | POA: Diagnosis not present

## 2021-12-31 DIAGNOSIS — J329 Chronic sinusitis, unspecified: Secondary | ICD-10-CM | POA: Diagnosis not present

## 2021-12-31 DIAGNOSIS — I10 Essential (primary) hypertension: Secondary | ICD-10-CM | POA: Diagnosis not present

## 2022-01-01 ENCOUNTER — Encounter: Payer: Self-pay | Admitting: Gastroenterology

## 2022-01-03 ENCOUNTER — Ambulatory Visit
Admission: RE | Admit: 2022-01-03 | Discharge: 2022-01-03 | Disposition: A | Discharge status: Home / Self Care | Payer: PPO | Source: Ambulatory Visit | Attending: Family Medicine | Admitting: Family Medicine

## 2022-01-03 DIAGNOSIS — J329 Chronic sinusitis, unspecified: Secondary | ICD-10-CM | POA: Diagnosis not present

## 2022-01-03 DIAGNOSIS — J3489 Other specified disorders of nose and nasal sinuses: Secondary | ICD-10-CM | POA: Diagnosis not present

## 2022-01-03 DIAGNOSIS — J342 Deviated nasal septum: Secondary | ICD-10-CM | POA: Diagnosis not present

## 2022-01-03 DIAGNOSIS — Z9889 Other specified postprocedural states: Secondary | ICD-10-CM | POA: Diagnosis not present

## 2022-01-03 DIAGNOSIS — J019 Acute sinusitis, unspecified: Secondary | ICD-10-CM

## 2022-01-06 ENCOUNTER — Other Ambulatory Visit: Payer: Self-pay

## 2022-01-06 DIAGNOSIS — J019 Acute sinusitis, unspecified: Secondary | ICD-10-CM

## 2022-01-07 ENCOUNTER — Encounter: Payer: Self-pay | Admitting: Gastroenterology

## 2022-01-07 ENCOUNTER — Other Ambulatory Visit: Payer: Self-pay

## 2022-01-07 ENCOUNTER — Ambulatory Visit (AMBULATORY_SURGERY_CENTER): Payer: PPO | Admitting: Gastroenterology

## 2022-01-07 VITALS — BP 144/72 | HR 47 | Temp 98.6°F | Resp 15 | Ht 65.5 in | Wt 141.0 lb

## 2022-01-07 DIAGNOSIS — G4730 Sleep apnea, unspecified: Secondary | ICD-10-CM | POA: Diagnosis not present

## 2022-01-07 DIAGNOSIS — I2510 Atherosclerotic heart disease of native coronary artery without angina pectoris: Secondary | ICD-10-CM | POA: Diagnosis not present

## 2022-01-07 DIAGNOSIS — D122 Benign neoplasm of ascending colon: Secondary | ICD-10-CM

## 2022-01-07 DIAGNOSIS — D124 Benign neoplasm of descending colon: Secondary | ICD-10-CM | POA: Diagnosis not present

## 2022-01-07 DIAGNOSIS — D120 Benign neoplasm of cecum: Secondary | ICD-10-CM

## 2022-01-07 DIAGNOSIS — R195 Other fecal abnormalities: Secondary | ICD-10-CM

## 2022-01-07 DIAGNOSIS — Z1211 Encounter for screening for malignant neoplasm of colon: Secondary | ICD-10-CM | POA: Diagnosis not present

## 2022-01-07 DIAGNOSIS — F319 Bipolar disorder, unspecified: Secondary | ICD-10-CM | POA: Diagnosis not present

## 2022-01-07 DIAGNOSIS — D123 Benign neoplasm of transverse colon: Secondary | ICD-10-CM

## 2022-01-07 DIAGNOSIS — D125 Benign neoplasm of sigmoid colon: Secondary | ICD-10-CM

## 2022-01-07 MED ORDER — SODIUM CHLORIDE 0.9 % IV SOLN: 500.0000 mL | Freq: Once | INTRAVENOUS | Status: DC

## 2022-01-07 NOTE — Progress Notes (Signed)
To PACU, VSS. Report to [email or web address] 

## 2022-01-07 NOTE — Patient Instructions (Signed)
Handouts provided on polyps and diverticulosis.  ? ?YOU HAD AN ENDOSCOPIC PROCEDURE TODAY AT THE [facility]:   Refer to the procedure report that was given to you for any specific questions about what was found during the examination.  If the procedure report does not answer your questions, please call your gastroenterologist to clarify.  If you requested that your care partner not be given the details of your procedure findings, then the procedure report has been included in a sealed envelope for you to review at your convenience later. ? ?YOU SHOULD EXPECT: Some feelings of bloating in the abdomen. Passage of more gas than usual.  Walking can help get rid of the air that was put into your GI tract during the procedure and reduce the bloating. If you had a lower endoscopy (such as a colonoscopy or flexible sigmoidoscopy) you may notice spotting of blood in your stool or on the toilet paper. If you underwent a bowel prep for your procedure, you may not have a normal bowel movement for a few days. ? ?Please Note:  You might notice some irritation and congestion in your nose or some drainage.  This is from the oxygen used during your procedure.  There is no need for concern and it should clear up in a day or so. ? ?SYMPTOMS TO REPORT IMMEDIATELY: ? ?Following lower endoscopy (colonoscopy or flexible sigmoidoscopy): ? Excessive amounts of blood in the stool ? Significant tenderness or worsening of abdominal pains ? Swelling of the abdomen that is new, acute ? Fever of 100?F or higher ? ?For urgent or emergent issues, a gastroenterologist can be reached at any hour by calling [phone]. ?Do not use MyChart messaging for urgent concerns.  ? ? ?DIET:  We do recommend a small meal at first, but then you may proceed to your regular diet.  Drink plenty of fluids but you should avoid alcoholic beverages for 24 hours. ? ?ACTIVITY:  You should plan to take it easy for the rest of today and you should NOT  DRIVE or use heavy machinery until tomorrow (because of the sedation medicines used during the test).   ? ?FOLLOW UP: ?Our staff will call the number listed on your records 48-72 hours following your procedure to check on you and address any questions or concerns that you may have regarding the information given to you following your procedure. If we do not reach you, we will leave a message.  We will attempt to reach you two times.  During this call, we will ask if you have developed any symptoms of COVID 19. If you develop any symptoms (ie: fever, flu-like symptoms, shortness of breath, cough etc.) before then, please call [phone].  If you test positive for Covid 19 in the 2 weeks post procedure, please call and report this information to us.   ? ?If any biopsies were taken you will be contacted by phone or by letter within the next 1-3 weeks.  Please call us at [phone] if you have not heard about the biopsies in 3 weeks.  ? ? ?SIGNATURES/CONFIDENTIALITY: ?You and/or your care partner have signed paperwork which will be entered into your electronic medical record.  These signatures attest to the fact that that the information above on your After Visit Summary has been reviewed and is understood.  Full responsibility of the confidentiality of this discharge information lies with you and/or your care-partner. ? ?

## 2022-01-07 NOTE — Progress Notes (Signed)
Pt's states no medical or surgical changes since previsit or office visit. 

## 2022-01-07 NOTE — Op Note (Signed)
[facility] Patient Name: Gregory [name] Procedure Date: 01/07/2022 2:08 PM MRN: [id number] Endoscopist: Ladene Artist , MD Age: 71 Referring MD:  Date of Birth: 09-19-1951 Gender: Male Account #: [id number] Procedure:                Colonoscopy Indications:              Positive Cologuard test Medicines:                Monitored Anesthesia Care Procedure:                Pre-Anesthesia Assessment:                           - Prior to the procedure, a History and Physical                            was performed, and patient medications and                            allergies were reviewed. The patient's tolerance of                            previous anesthesia was also reviewed. The risks                            and benefits of the procedure and the sedation                            options and risks were discussed with the patient.                            All questions were answered, and informed consent                            was obtained. Prior Anticoagulants: The patient has                            taken no previous anticoagulant or antiplatelet                            agents. ASA Grade Assessment: III - A patient with                            severe systemic disease. After reviewing the risks                            and benefits, the patient was deemed in                            satisfactory condition to undergo the procedure.                           After obtaining informed consent, the colonoscope  was passed under direct vision. Throughout the                            procedure, the patient's blood pressure, pulse, and                            oxygen saturations were monitored continuously. The                            Olympus CF-HQ190L [id number]) Colonoscope was                            introduced through the anus and advanced to the the                            cecum, identified by appendiceal  orifice and                            ileocecal valve. The ileocecal valve, appendiceal                            orifice, and rectum were photographed. The quality                            of the bowel preparation was good. The colonoscopy                            was performed without difficulty. The patient                            tolerated the procedure well. Scope In: 2:34:52 PM Scope Out: 3:04:53 PM Scope Withdrawal Time: 0 hours 25 minutes 9 seconds  Total Procedure Duration: 0 hours 30 minutes 1 second  Findings:                 The perianal and digital rectal examinations were                            normal.                           Fourteen sessile polyps were found in the sigmoid                            colon (4), descending colon (3), transverse colon                            (5), ascending colon (1) and cecum (1). The polyps                            were 6 to 10 mm in size. These polyps were removed                            with a cold snare. Resection and retrieval were  complete.                           A few small-mouthed diverticula were found in the                            left colon. There was no evidence of diverticular                            bleeding.                           The exam was otherwise without abnormality on                            direct and retroflexion views. Complications:            No immediate complications. Estimated blood loss:                            None. Estimated Blood Loss:     Estimated blood loss: none. Impression:               - Fourteen 6 to 10 mm polyps in the sigmoid colon,                            in the descending colon, in the transverse colon,                            in the ascending colon and in the cecum, removed                            with a cold snare. Resected and retrieved.                           - Mild diverticulosis in the left colon.                            - The examination was otherwise normal on direct                            and retroflexion views. Recommendation:           - Repeat colonoscopy, likely 1-3 years, after                            studies are complete for surveillance based on                            pathology results.                           - Patient has a contact number available for                            emergencies. The signs and symptoms of potential  delayed complications were discussed with the                            patient. Return to normal activities tomorrow.                            Written discharge instructions were provided to the                            patient.                           - Resume previous diet.                           - Continue present medications.                           - Await pathology results. Ladene Artist, MD 01/07/2022 3:09:34 PM This report has been signed electronically.

## 2022-01-07 NOTE — Progress Notes (Signed)
Called to room to assist during endoscopic procedure.  Patient ID and intended procedure confirmed with present staff. Received instructions for my participation in the procedure from the performing physician.  

## 2022-01-07 NOTE — Progress Notes (Signed)
History & Physical  Primary Care Physician:  [name], MD Primary Gastroenterologist: Lucio Edward, MD  CHIEF COMPLAINT: Positive Cologuard  HPI: Gregory [name] is a 71 y.o. male with a positive Cologuard for further evaluation with colonoscopy.    Past Medical History:  Diagnosis Date   Alcohol abuse    Aortic stenosis    Arthritis    Asthma    exercise indused or pollen-uses inhaler dail yand has rescue inhaler if needed   Bipolar disorder ([place])    CAD (coronary artery disease)    coronary calcifications 08/2013 CTA   Carotid artery occlusion    Cataract    bilateral sx   Chronic back pain    Claudication ([place])    Complication of anesthesia 10/16/2020   "had a panic attack when the mask was put on me"" I do not think I was given anything  before, I need something to calm me down"   Depression    Family history of skin cancer    Heart murmur    as an infant   Hepatitis    h/o 1970,doesn't remember which type,1990 epstain-barr   Hyperlipidemia    Hypertension    Neuromuscular disorder ([place])    Obsessive compulsive disorder    PAD (peripheral artery disease) ([place])    Peripheral neuropathy    Pneumonia    RBBB    RBBB (right bundle branch block with left anterior fascicular block)    Seasonal allergies    Severe aortic stenosis    Sleep apnea    does not wear CPAP   Smoker    Spinal stenosis at L4-L5 level    Tobacco abuse    Tobacco use disorder     Past Surgical History:  Procedure Laterality Date   ANAL FISTULECTOMY  09/25/2011   AORTA - BILATERAL FEMORAL ARTERY BYPASS GRAFT N/A 10/03/2020   Procedure: AORTA BIFEMORAL BYPASS GRAFT USING A HEMASHIELD GOLD BIFURCATED 14 X 49mm GRAFT AND INFERIOR MESENTERIC ARTERY REIMPLANTATION;  Surgeon: Serafina Mitchell, MD;  Location: [facility];  Service: Vascular;  Laterality: N/A;   AORTIC VALVE REPLACEMENT N/A 11/24/2019   Procedure: AORTIC VALVE REPLACEMENT (AVR) using INSPIRIS Resilia 23 MM Bioprosthetic  Aortic Valve.;  Surgeon: Gaye Pollack, MD;  Location: [facility];  Service: Open Heart Surgery;  Laterality: N/A;   APPLICATION OF WOUND VAC Left 10/17/2020   Procedure: APPLICATION OF WOUND VAC;  Surgeon: Serafina Mitchell, MD;  Location: [facility] OR;  Service: Vascular;  Laterality: Left;   BACK SURGERY     2015   CATARACT EXTRACTION, BILATERAL  2022   COLONOSCOPY  2009   MS-F/V-mov(exc)-HPP   CORONARY ARTERY BYPASS GRAFT N/A 11/24/2019   Procedure: CORONARY ARTERY BYPASS GRAFTING (CABG) using LIMA to LAD.;  Surgeon: Gaye Pollack, MD;  Location: [facility] OR;  Service: Open Heart Surgery;  Laterality: N/A;   ELBOW SURGERY     bilaterally for cubital tunnel   ENDARTERECTOMY N/A 10/03/2020   Procedure: AORTIC ENDARTERECTOMY;  Surgeon: Serafina Mitchell, MD;  [facility];  Service: Vascular;  Laterality: N/A;   ENDARTERECTOMY FEMORAL Bilateral 10/03/2020   Procedure: BILATERAL FEMORAL ENDARTERECTOMY;  Surgeon: Serafina Mitchell, MD;  [facility];  Service: Vascular;  Laterality: Bilateral;   HERNIA REPAIR     with mesh   INCISION AND DRAINAGE Left 01/02/2021   Procedure: INCISION AND DRAINAGE LEFT GROIN WITH STIMULAN BEADS;  Surgeon: Serafina Mitchell, MD;  Location: [facility];  Service:  Vascular;  Laterality: Left;   INCISION AND DRAINAGE OF WOUND Left 10/17/2020   Procedure: EXPLORATION LEFT GROIN WOUND;  Surgeon: Serafina Mitchell, MD;  [facility];  Service: Vascular;  Laterality: Left;   MAXIMUM ACCESS (MAS)POSTERIOR LUMBAR INTERBODY FUSION (PLIF) 1 LEVEL N/A 10/25/2014   Procedure: LUMBAR FOUR TO FIVE MAXIMUM ACCESS (MAS) POSTERIOR LUMBAR INTERBODY FUSION (PLIF) 1 LEVEL;  Surgeon: Eustace Moore, MD;  Location: [place];  Service: Neurosurgery;  Laterality: N/A;   RIGHT/LEFT HEART CATH AND CORONARY ANGIOGRAPHY N/A 11/10/2019   Procedure: RIGHT/LEFT HEART CATH AND CORONARY ANGIOGRAPHY;  Surgeon: [name], [name], MD;  Location: [facility];  Service: Cardiovascular;  Laterality: N/A;    SKIN BIOPSY Left 10/12/2018   shave forehead Hypertrophic actinic kertosis with features of a verruca   TEE WITHOUT CARDIOVERSION N/A 11/24/2019   Procedure: TRANSESOPHAGEAL ECHOCARDIOGRAM (TEE);  Surgeon: Gaye Pollack, MD;  Location: [facility];  Service: Open Heart Surgery;  Laterality: N/A;   TONSILLECTOMY  age 9   VASECTOMY     X 2   VASECTOMY REVERSAL      Prior to Admission medications   Medication Sig Start Date End Date Taking? Authorizing Provider  acetaminophen (TYLENOL) 325 MG tablet Take 2 tablets (650 mg total) by mouth every 6 (six) hours as needed for mild pain. 11/30/19  Yes Barrett, Erin R, PA-C  amLODipine (NORVASC) 10 MG tablet TAKE ONE TABLET BY MOUTH DAILY 08/22/21  Yes [name], MD  aspirin EC 325 MG EC tablet Take 1 tablet (325 mg total) by mouth daily. 11/30/19  Yes Barrett, Erin R, PA-C  atorvastatin (LIPITOR) 80 MG tablet Take 1 tablet (80 mg total) by mouth daily. 12/02/21  Yes [name], MD  divalproex (DEPAKOTE ER) 500 MG 24 hr tablet Take 500 mg by mouth 3 (three) times daily. 2- 3 tablets once daily 10/05/21  Yes [provider]  finasteride (PROSCAR) 5 MG tablet TAKE ONE TABLET BY MOUTH DAILY 12/09/21  Yes [name], MD  guaiFENesin 200 MG tablet Take 200 mg by mouth every 4 (four) hours as needed for cough or to loosen phlegm.   Yes [provider]  lamoTRIgine (LAMICTAL) 200 MG tablet Take 1 tablet (200 mg total) by mouth every morning. 01/05/14  Yes [name], MD  lisinopril-hydrochlorothiazide (ZESTORETIC) 20-12.5 MG tablet Take 1 tablet by mouth every morning. 10/07/21  Yes [name], MD  Multiple Vitamins-Minerals (MULTIVITAMIN ADULTS 50+ PO) Take 1 tablet by mouth daily.    Yes [provider]  albuterol (VENTOLIN HFA) 108 (90 Base) MCG/ACT inhaler INHALE TWO PUFFS BY MOUTH EVERY 6 HOURS AS NEEDED FOR WHEEZING 05/09/21   [name], MD  fluticasone [facility]) 50 MCG/ACT nasal spray SPRAY TWO SPRAYS IN  THE AFFECTED NOSTRIL DAILY 08/05/21   [name], MD  Fluticasone-Salmeterol (ADVAIR DISKUS) 500-50 MCG/DOSE AEPB Inhale 1 puff into the lungs every 12 (twelve) hours. 03/07/19   [name], MD  nicotine (NICODERM CQ - DOSED IN MG/24 HOURS) 21 mg/24hr patch Place 21 mg onto the skin daily. Patient not taking: Reported on 12/26/2021    [provider]    Current Outpatient Medications  Medication Sig Dispense Refill   acetaminophen (TYLENOL) 325 MG tablet Take 2 tablets (650 mg total) by mouth every 6 (six) hours as needed for mild pain.     amLODipine (NORVASC) 10 MG tablet TAKE ONE TABLET BY MOUTH DAILY 90 tablet  2   aspirin EC 325 MG EC tablet Take 1 tablet (325 mg total) by mouth daily. 30 tablet 0   atorvastatin (LIPITOR) 80 MG tablet Take 1 tablet (80 mg total) by mouth daily. 90 tablet 0   divalproex (DEPAKOTE ER) 500 MG 24 hr tablet Take 500 mg by mouth 3 (three) times daily. 2- 3 tablets once daily     finasteride (PROSCAR) 5 MG tablet TAKE ONE TABLET BY MOUTH DAILY 90 tablet 0   guaiFENesin 200 MG tablet Take 200 mg by mouth every 4 (four) hours as needed for cough or to loosen phlegm.     lamoTRIgine (LAMICTAL) 200 MG tablet Take 1 tablet (200 mg total) by mouth every morning. 90 tablet 3   lisinopril-hydrochlorothiazide (ZESTORETIC) 20-12.5 MG tablet Take 1 tablet by mouth every morning. 90 tablet 2   Multiple Vitamins-Minerals (MULTIVITAMIN ADULTS 50+ PO) Take 1 tablet by mouth daily.      albuterol (VENTOLIN HFA) 108 (90 Base) MCG/ACT inhaler INHALE TWO PUFFS BY MOUTH EVERY 6 HOURS AS NEEDED FOR WHEEZING 8.5 g 1   fluticasone (FLONASE) 50 MCG/ACT nasal spray SPRAY TWO SPRAYS IN THE AFFECTED NOSTRIL DAILY 48 mL 2   Fluticasone-Salmeterol (ADVAIR DISKUS) 500-50 MCG/DOSE AEPB Inhale 1 puff into the lungs every 12 (twelve) hours. 180 each 3   nicotine (NICODERM CQ - DOSED IN MG/24 HOURS) 21 mg/24hr patch Place 21 mg onto the skin daily. (Patient not taking: Reported on  12/26/2021)     Current Facility-Administered Medications  Medication Dose Route Frequency Provider Last Rate Last Admin   0.9 %  sodium chloride infusion  500 mL Intravenous Once Ladene Artist, MD        Allergies as of 01/07/2022 - Review Complete 01/07/2022  Allergen Reaction Noted   Codeine Anaphylaxis 10/18/2008    Family History  Problem Relation Age of Onset   Colon polyps Mother 36   Heart failure Mother    Stroke Father 42   Cancer Father        skin   Drug abuse Father    Hypertension Brother    Diabetes Brother    Drug abuse Brother    Heart disease Brother        s/p CABG, pacemaker   Cancer Maternal Aunt        skin   Cancer Maternal Grandmother        liver   Colon cancer Neg Hx    Esophageal cancer Neg Hx    Rectal cancer Neg Hx    Stomach cancer Neg Hx     Social History   Socioeconomic History   Marital status: Married    Spouse name: Not on file   Number of children: 6   Years of education: Not on file   Highest education level: Not on file  Occupational History   Not on file  Tobacco Use   Smoking status: Every Day    Packs/day: 0.50    Years: 25.00    Pack years: 12.50    Types: Cigarettes   Smokeless tobacco: Never   Tobacco comments:    Pt had stopped smoking X10 years/ currently smoking a pack a day   Vaping Use   Vaping Use: Never used  Substance and Sexual Activity   Alcohol use: Not Currently    Comment: 06/26/2014- last drink- AA   Drug use: No    Comment: former alcoholic   Sexual activity: Yes  Other Topics Concern   Not  on file  Social History Narrative   Lives in [place] with wife.  Does not routinely exercise.  Sedentary r/t chronic lbp.   Social Determinants of Health   Financial Resource Strain: Low Risk    Difficulty of Paying Living Expenses: Not hard at all  Food Insecurity: Not on file  Transportation Needs: No Transportation Needs   Lack of Transportation (Medical): No   Lack of Transportation (Non-Medical):  No  Physical Activity: Not on file  Stress: Not on file  Social Connections: Not on file  Intimate Partner Violence: Not on file    Review of Systems:  All systems reviewed an negative except where noted in HPI.  Gen: Denies any fever, chills, sweats, anorexia, fatigue, weakness, malaise, weight loss, and sleep disorder CV: Denies chest pain, angina, palpitations, syncope, orthopnea, PND, peripheral edema, and claudication. Resp: Denies dyspnea at rest, dyspnea with exercise, cough, sputum, wheezing, coughing up blood, and pleurisy. GI: Denies vomiting blood, jaundice, and fecal incontinence.   Denies dysphagia or odynophagia. GU : Denies urinary burning, blood in urine, urinary frequency, urinary hesitancy, nocturnal urination, and urinary incontinence. MS: Denies joint pain, limitation of movement, and swelling, stiffness, low back pain, extremity pain. Denies muscle weakness, cramps, atrophy.  Derm: Denies rash, itching, dry skin, hives, moles, warts, or unhealing ulcers.  Psych: Denies depression, anxiety, memory loss, suicidal ideation, hallucinations, paranoia, and confusion. Heme: Denies bruising, bleeding, and enlarged lymph nodes. Neuro:  Denies any headaches, dizziness, paresthesias. Endo:  Denies any problems with DM, thyroid, adrenal function.   Physical Exam: Vital signs in last 24 hours: General:  Alert, well-developed, in NAD Head:  Normocephalic and atraumatic. Eyes:  Sclera clear, no icterus.   Conjunctiva pink. Ears:  Normal auditory acuity. Mouth:  No deformity or lesions.  Neck:  Supple; no masses . Lungs:  Clear throughout to auscultation.   No wheezes, crackles, or rhonchi. No acute distress. Heart:  Regular rate and rhythm; no murmurs. Abdomen:  Soft, nondistended, nontender. No masses, hepatomegaly. No obvious masses.  Normal bowel .    Rectal:  Deferred   Msk:  Symmetrical without gross deformities.. Pulses:  Normal pulses noted. Extremities:  Without  edema. Neurologic:  Alert and  oriented x4;  grossly normal neurologically. Skin:  Intact without significant lesions or rashes. Cervical Nodes:  No significant cervical adenopathy. Psych:  Alert and cooperative. Normal mood and affect.   Impression / Plan:   Positive Cologuard for further evaluation with colonoscopy.   Pricilla Riffle. Fuller Plan  01/07/2022, 2:26 PM See Shea Evans, [facility], to contact our on call provider

## 2022-01-08 ENCOUNTER — Other Ambulatory Visit: Payer: Self-pay | Admitting: Family Medicine

## 2022-01-08 NOTE — Telephone Encounter (Signed)
[facility] is requesting to fill pt albuterol inhaler. Please advise [facility]

## 2022-01-09 ENCOUNTER — Telehealth: Payer: Self-pay

## 2022-01-09 NOTE — Telephone Encounter (Signed)
°  Follow up Call-  Call back number 01/07/2022  Post procedure Call Back phone  # [phone]  Permission to leave phone message Yes  Some recent data might be hidden   2nd f/u call attempted. Nom answer,left voicemail.

## 2022-01-09 NOTE — Telephone Encounter (Signed)
First post procedure follow up call, no answer 

## 2022-01-15 ENCOUNTER — Encounter: Payer: Self-pay | Admitting: Gastroenterology

## 2022-02-27 ENCOUNTER — Other Ambulatory Visit: Payer: Self-pay | Admitting: Family Medicine

## 2022-03-01 IMAGING — CT CT CTA ABD/PEL W/CM AND/OR W/O CM
2 of 6 series · 14 of 46 positions shown, 16 images · IV contrast (OMNI 350)
Comparison: 11/18/2019

CLINICAL DATA: Peripheral arterial disease, iliac artery occlusion

EXAM:
CTA ABDOMEN AND PELVIS WITH CONTRAST
TECHNIQUE: Multidetector CT imaging of the abdomen and pelvis was performed
using the standard protocol during bolus administration of
intravenous contrast. Multiplanar reconstructed images and MIPs were
obtained and reviewed to evaluate the vascular anatomy.
CONTRAST:  100mL OMNIPAQUE IOHEXOL 350 MG/ML SOLN

[Series 6: dissection 2mm · axial · 0.91mm/px · z∈[+902,+1298]mm · 11 of 236 slices shown, 13 images]
[im 19/236  soft-tissue]
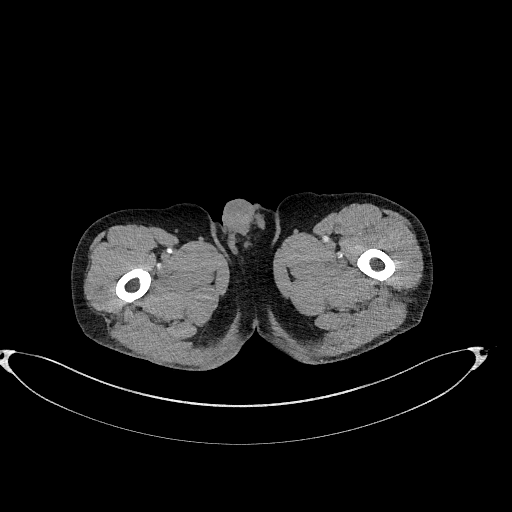
[im 19/236  bone]
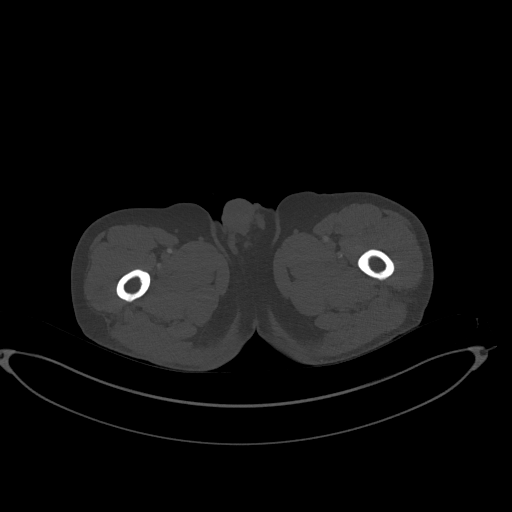
[im 38/236  soft-tissue]
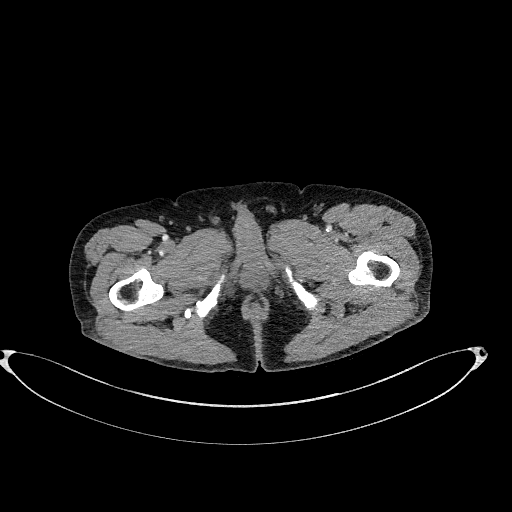
[im 57/236  soft-tissue]
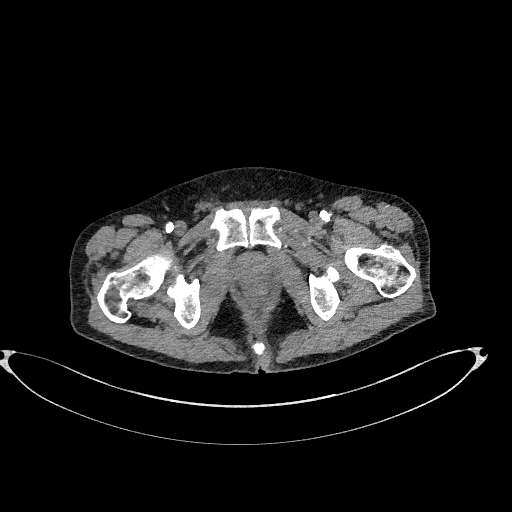
[im 76/236  soft-tissue]
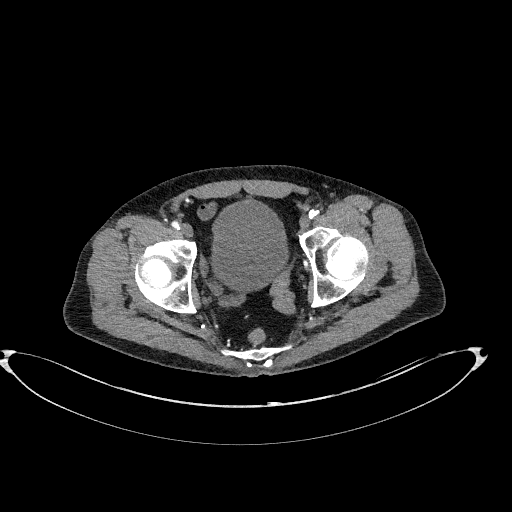
[im 95/236  soft-tissue]
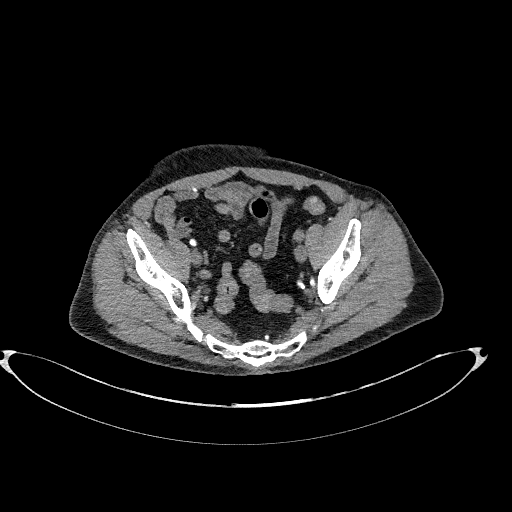
[im 123/236  soft-tissue]
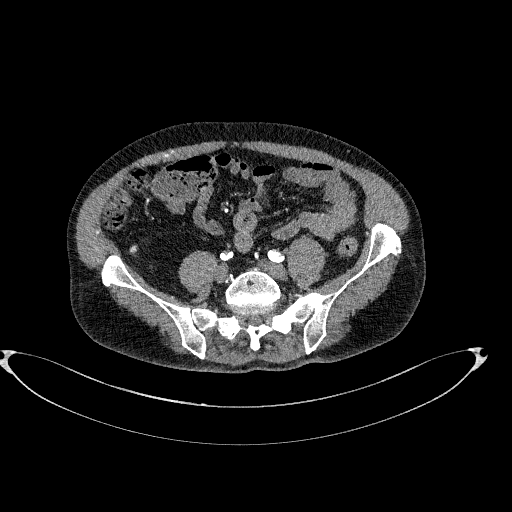
[im 142/236  soft-tissue]
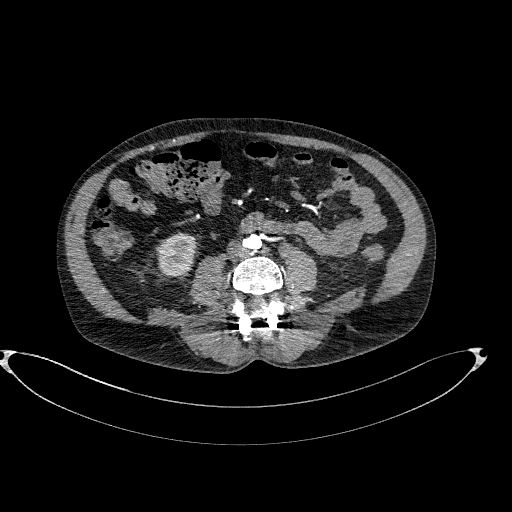
[im 160/236  soft-tissue]
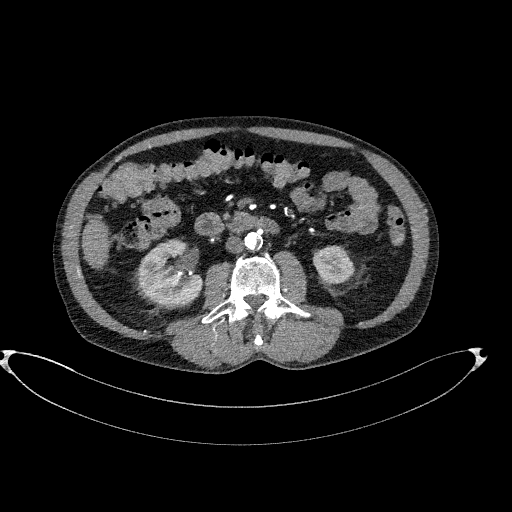
[im 179/236  soft-tissue]
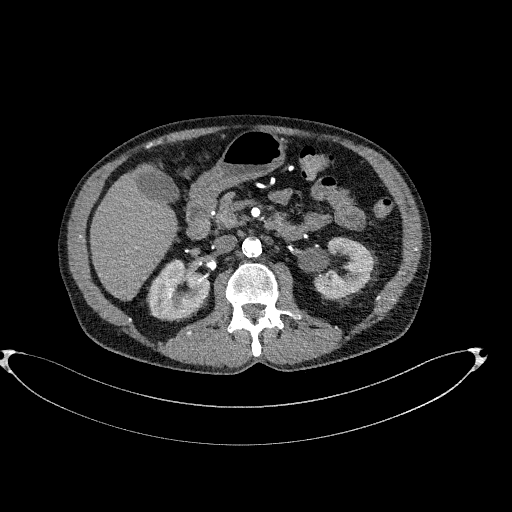
[im 179/236  bone]
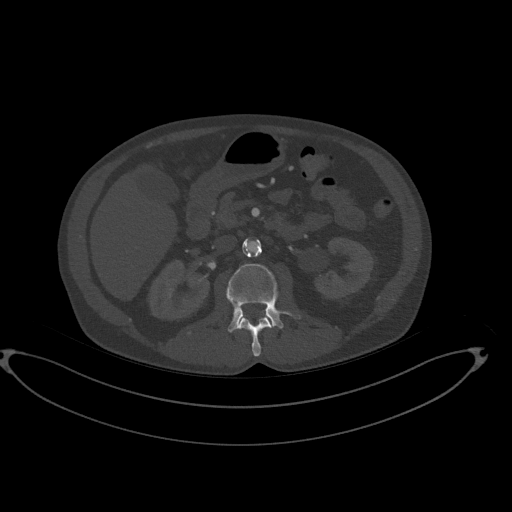
[im 198/236  soft-tissue]
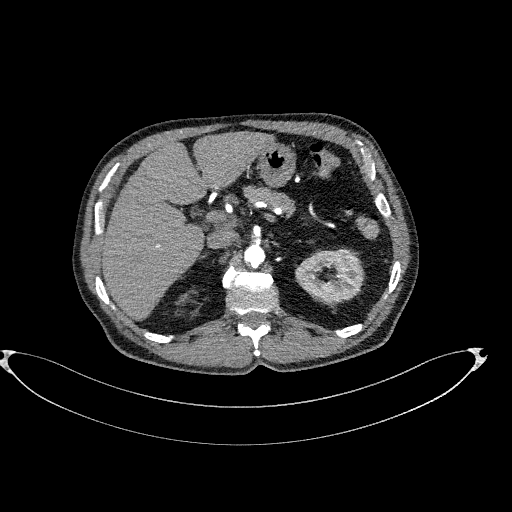
[im 217/236  soft-tissue]
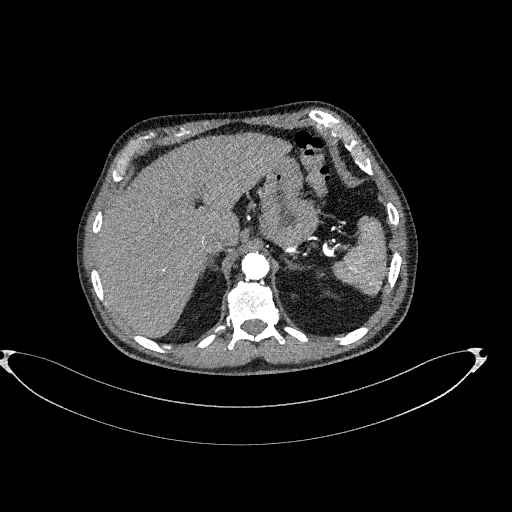

[Series 9: dissection 2mm cor · coronal · 0.69mm/px · 3 of 146 slices shown]
[im 37/146  soft-tissue]
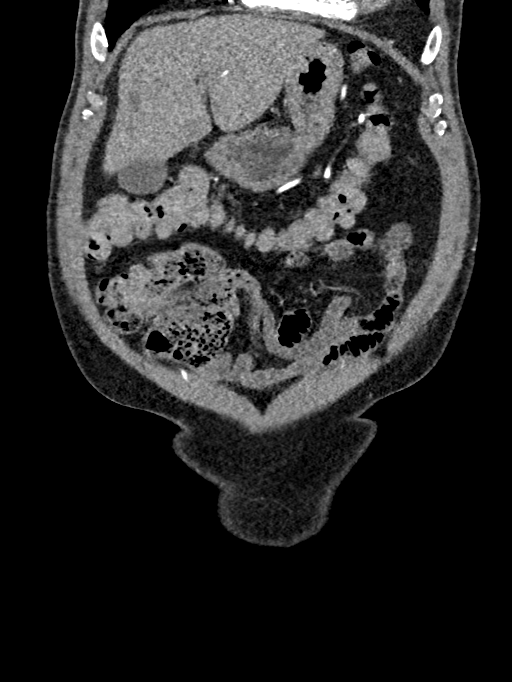
[im 73/146  soft-tissue]
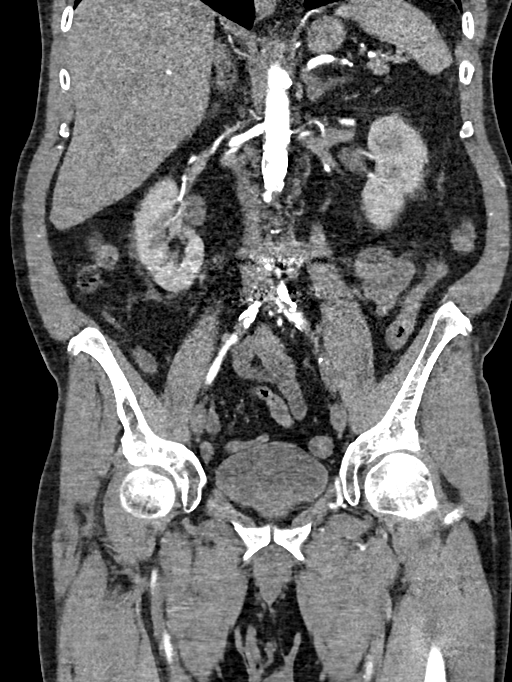
[im 109/146  soft-tissue]
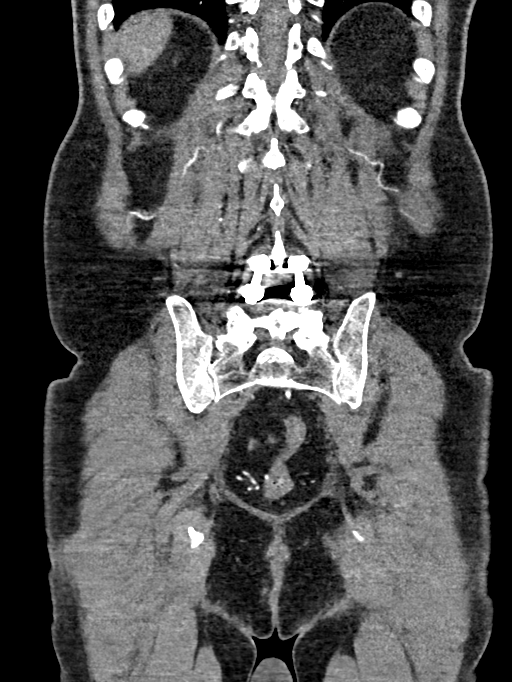

[14 of 46 positions shown; findings below may reference images not displayed]

FINDINGS: VASCULAR

Aorta: Heavy calcified atheromatous plaque particularly in the
infrarenal segment with long segment mild stenosis through the
infrarenal segment, with stenosis of moderate severity just proximal
to the bifurcation. No dissection or aneurysm.

Celiac: Patent without evidence of aneurysm, dissection, vasculitis
or significant stenosis.

SMA: Proximal plaque without high-grade stenosis. Classic distal
branch anatomy.

Renals: On the left, there are 3. The superior is dominant, with
early branching, and calcified ostial plaque resulting in short
segment stenosis of possible hemodynamic significance.

On the right, there are 3, the middle dominant with partially
calcified ostial plaque resulting in only mild stenosis.

IMA: Ostial stenosis related to aortic wall plaque with moderate
post-stenotic dilatation.

Inflow: On the right, occlusion through the length of the common
iliac. Heavily diseased internal iliac with distal occlusion. Mild
plaque in the proximal external iliac which is patent distally.

On the left, heavily calcified eccentric plaque through the common
iliac with short-segment moderate stenosis in its midportion. Origin
stenosis of the internal iliac which is atheromatous but patent
distally. Long segment occlusion of the external iliac through the
common femoral artery.

Proximal Outflow: On the right, calcified eccentric plaque in the
common femoral resulting in at least moderate short segment
stenosis.

On the left, heavily calcified plaque with occluded right common
femoral. Apparent collateral reconstitution of flow in the proximal
SFA,

Veins: No obvious venous abnormality within the limitations of this
arterial phase study.

Review of the MIP images confirms the above findings.

NON-VASCULAR

Lower chest: No acute abnormality.

Hepatobiliary: Stable cyst in segment 4. No new lesion. Gallbladder
nondistended. No biliary ductal dilatation.

Pancreas: Unremarkable. No pancreatic ductal dilatation or
surrounding inflammatory changes.

Spleen: Normal in size without focal abnormality.

Adrenals/Urinary Tract: Right adrenal adenoma. No renal mass or
hydronephrosis. Urinary bladder physiologically distended.

Stomach/Bowel: Stomach is incompletely distended. Small bowel is
nondilated. Normal appendix. The colon is nondilated, unremarkable.

Lymphatic: No abdominal or pelvic adenopathy.

Reproductive: Mild prostate enlargement.

Other: No ascites. No free air.

Musculoskeletal: Stable scarring at the right inguinal canal.
Solid-appearing PLIF L4-5. Mild adjacent level degenerative disc
disease L3-4. No fracture or worrisome bone lesion.
IMPRESSION: 1. Infrarenal aortic stenosis with RIGHT common iliac artery
occlusion. Reconstituted right external iliac artery.
2. Occluded LEFT external iliac and common femoral arteries.
Reconstituted proximal left SFA.

Aortic Atherosclerosis ([id number]-[id number].D).

## 2022-03-06 ENCOUNTER — Other Ambulatory Visit: Payer: Self-pay | Admitting: Family Medicine

## 2022-03-28 DIAGNOSIS — J331 Polypoid sinus degeneration: Secondary | ICD-10-CM | POA: Diagnosis not present

## 2022-03-28 DIAGNOSIS — J342 Deviated nasal septum: Secondary | ICD-10-CM | POA: Diagnosis not present

## 2022-03-28 DIAGNOSIS — J343 Hypertrophy of nasal turbinates: Secondary | ICD-10-CM | POA: Diagnosis not present

## 2022-03-28 DIAGNOSIS — F17210 Nicotine dependence, cigarettes, uncomplicated: Secondary | ICD-10-CM | POA: Diagnosis not present

## 2022-04-10 ENCOUNTER — Ambulatory Visit: Payer: PPO | Admitting: Family Medicine

## 2022-04-11 ENCOUNTER — Ambulatory Visit (INDEPENDENT_AMBULATORY_CARE_PROVIDER_SITE_OTHER): Payer: PPO

## 2022-04-11 VITALS — Ht 65.5 in | Wt 140.0 lb

## 2022-04-11 DIAGNOSIS — Z0000 Encounter for general adult medical examination without abnormal findings: Secondary | ICD-10-CM

## 2022-04-11 NOTE — Patient Instructions (Signed)
Gregory [name] , ?Thank you for taking time to come for your Medicare Wellness Visit. I appreciate your ongoing commitment to your health goals. Please review the following plan we discussed and let me know if I can assist you in the future.  ? ?Screening recommendations/referrals: ?Colonoscopy: completed 01/07/2022, due 01/07/2023 ?Recommended yearly ophthalmology/optometry visit for glaucoma screening and checkup ?Recommended yearly dental visit for hygiene and checkup ? ?Vaccinations: ?Influenza vaccine: due 07/22/2022 ?Pneumococcal vaccine: completed 01/31/2016 ?Tdap vaccine: completed 10/29/2012, due 10/29/2022 ?Shingles vaccine: completed    ?Covid-19:  10/07/2021, 11/11/2020, 02/24/2020, 01/30/2020 ? ?Advanced directives: copy in chart ? ?Conditions/risks identified: none ? ?Next appointment: Follow up in one year for your annual wellness visit.  ? ?Preventive Care 72 Years and Older, Male ?Preventive care refers to lifestyle choices and visits with your health care provider that can promote health and wellness. ?What does preventive care include? ?A yearly physical exam. This is also called an annual well check. ?Dental exams once or twice a year. ?Routine eye exams. Ask your health care provider how often you should have your eyes checked. ?Personal lifestyle choices, including: ?Daily care of your teeth and gums. ?Regular physical activity. ?Eating a healthy diet. ?Avoiding tobacco and drug use. ?Limiting alcohol use. ?Practicing safe sex. ?Taking low doses of aspirin every day. ?Taking vitamin and mineral supplements as recommended by your health care provider. ?What happens during an annual well check? ?The services and screenings done by your health care provider during your annual well check will depend on your age, overall health, lifestyle risk factors, and family history of disease. ?Counseling  ?Your health care provider may ask you questions about your: ?Alcohol use. ?Tobacco use. ?Drug use. ?Emotional  well-being. ?Home and relationship well-being. ?Sexual activity. ?Eating habits. ?History of falls. ?Memory and ability to understand (cognition). ?Work and work Statistician. ?Screening  ?You may have the following tests or measurements: ?Height, weight, and BMI. ?Blood pressure. ?Lipid and cholesterol levels. These may be checked every 5 years, or more frequently if you are over 50 years old. ?Skin check. ?Lung cancer screening. You may have this screening every year starting at age 66 if you have a 30-pack-year history of smoking and currently smoke or have quit within the past 15 years. ?Fecal occult blood test (FOBT) of the stool. You may have this test every year starting at age 38. ?Flexible sigmoidoscopy or colonoscopy. You may have a sigmoidoscopy every 5 years or a colonoscopy every 10 years starting at age 54. ?Prostate cancer screening. Recommendations will vary depending on your family history and other risks. ?Hepatitis C blood test. ?Hepatitis B blood test. ?Sexually transmitted disease (STD) testing. ?Diabetes screening. This is done by checking your blood sugar (glucose) after you have not eaten for a while (fasting). You may have this done every 1-3 years. ?Abdominal aortic aneurysm (AAA) screening. You may need this if you are a current or former smoker. ?Osteoporosis. You may be screened starting at age 72 if you are at high risk. ?Talk with your health care provider about your test results, treatment options, and if necessary, the need for more tests. ?Vaccines  ?Your health care provider may recommend certain vaccines, such as: ?Influenza vaccine. This is recommended every year. ?Tetanus, diphtheria, and acellular pertussis (Tdap, Td) vaccine. You may need a Td booster every 10 years. ?Zoster vaccine. You may need this after age 72. ?Pneumococcal 13-valent conjugate (PCV13) vaccine. One dose is recommended after age 72. ?Pneumococcal polysaccharide (PPSV23) vaccine. One dose is recommended  after  age 72. ?Talk to your health care provider about which screenings and vaccines you need and how often you need them. ?This information is not intended to replace advice given to you by your health care provider. Make sure you discuss any questions you have with your health care provider. ?Document Released: 01/04/2016 Document Revised: 08/27/2016 Document Reviewed: 10/09/2015 ?Elsevier Interactive Patient Education ? 2017 [employer]. ? ?Fall Prevention in the Home ?Falls can cause injuries. They can happen to people of all ages. There are many things you can do to make your home safe and to help prevent falls. ?What can I do on the outside of my home? ?Regularly fix the edges of walkways and driveways and fix any cracks. ?Remove anything that might make you trip as you walk through a door, such as a raised step or threshold. ?Trim any bushes or trees on the path to your home. ?Use bright outdoor lighting. ?Clear any walking paths of anything that might make someone trip, such as rocks or tools. ?Regularly check to see if handrails are loose or broken. Make sure that both sides of any steps have handrails. ?Any raised decks and porches should have guardrails on the edges. ?Have any leaves, snow, or ice cleared regularly. ?Use sand or salt on walking paths during winter. ?Clean up any spills in your garage right away. This includes oil or grease spills. ?What can I do in the bathroom? ?Use night lights. ?Install grab bars by the toilet and in the tub and shower. Do not use towel bars as grab bars. ?Use non-skid mats or decals in the tub or shower. ?If you need to sit down in the shower, use a plastic, non-slip stool. ?Keep the floor dry. Clean up any water that spills on the floor as soon as it happens. ?Remove soap buildup in the tub or shower regularly. ?Attach bath mats securely with double-sided non-slip rug tape. ?Do not have throw rugs and other things on the floor that can make you trip. ?What can I do in the  bedroom? ?Use night lights. ?Make sure that you have a light by your bed that is easy to reach. ?Do not use any sheets or blankets that are too big for your bed. They should not hang down onto the floor. ?Have a firm chair that has side arms. You can use this for support while you get dressed. ?Do not have throw rugs and other things on the floor that can make you trip. ?What can I do in the kitchen? ?Clean up any spills right away. ?Avoid walking on wet floors. ?Keep items that you use a lot in easy-to-reach places. ?If you need to reach something above you, use a strong step stool that has a grab bar. ?Keep electrical cords out of the way. ?Do not use floor polish or wax that makes floors slippery. If you must use wax, use non-skid floor wax. ?Do not have throw rugs and other things on the floor that can make you trip. ?What can I do with my stairs? ?Do not leave any items on the stairs. ?Make sure that there are handrails on both sides of the stairs and use them. Fix handrails that are broken or loose. Make sure that handrails are as long as the stairways. ?Check any carpeting to make sure that it is firmly attached to the stairs. Fix any carpet that is loose or worn. ?Avoid having throw rugs at the top or bottom of the stairs. If you  do have throw rugs, attach them to the floor with carpet tape. ?Make sure that you have a light switch at the top of the stairs and the bottom of the stairs. If you do not have them, ask someone to add them for you. ?What else can I do to help prevent falls? ?Wear shoes that: ?Do not have high heels. ?Have rubber bottoms. ?Are comfortable and fit you well. ?Are closed at the toe. Do not wear sandals. ?If you use a stepladder: ?Make sure that it is fully opened. Do not climb a closed stepladder. ?Make sure that both sides of the stepladder are locked into place. ?Ask someone to hold it for you, if possible. ?Clearly mark and make sure that you can see: ?Any grab bars or  handrails. ?First and last steps. ?Where the edge of each step is. ?Use tools that help you move around (mobility aids) if they are needed. These include: ?Canes. ?Walkers. ?Scooters. ?Crutches. ?Turn on the lights when

## 2022-04-11 NOTE — Progress Notes (Signed)
?I connected with  CLAVIN RUHLMAN today via telehealth video enabled device and verified that I am speaking with the correct person using two identifiers. ?  ?Location: ?Patient: home ?Provider: work ? ?Persons participating in virtual visit: Ki, Corbo LPN ? ?I discussed the limitations, risks, security and privacy concerns of performing an evaluation and management service by video and the availability of in person appointments. The patient expressed understanding and agreed to proceed. ?  ?Some vital signs may be absent or patient reported.  ?  ? ?Subjective:  ? VALENTINE BARNEY is a 71 y.o. male who presents for Medicare Annual/Subsequent preventive examination. ? ?Review of Systems    ? ?Cardiac Risk Factors include: advanced age (>79mn, >>27women);dyslipidemia;hypertension;male gender ? ?   ?Objective:  ?  ?Today's Vitals  ? 04/11/22 0355704/21/23 [id number]  ?Weight: 140 lb (63.5 kg)   ?Height: 5' 5.5" (1.664 m)   ?PainSc:  2   ? ?Body mass index is 22.94 kg/m?. ? ? ?  04/11/2022  ?  8:50 AM 04/03/2021  ?  1:43 PM 01/02/2021  ? 11:11 AM 10/17/2020  ?  8:38 AM 10/11/2020  ?  8:00 AM 09/26/2020  ? 10:31 AM 06/04/2020  ? 11:09 AM  ?Advanced Directives  ?Does Patient Have a Medical Advance Directive? Yes Yes Yes Yes Yes Yes Yes  ?Type of [place]of [place] will Living will Healthcare Power of Attorney Living will;Healthcare Power of [place] will [place]hambra ValleyLiving will H[place] LakeLiving will  ?Does patient want to make changes to medical advance directive?  No - Patient declined  No - Patient declined No - Patient declined No - Patient declined No - Patient declined  ?Copy of HSanta [place]ain Chart? Yes - validated most recent copy scanned in chart (See row information)   Yes - validated most recent copy scanned in chart (See row information) Yes - validated most recent copy  scanned in chart (See row information) Yes - validated most recent copy scanned in chart (See row information)   ? ? ?Current Medications (verified) ?Outpatient Encounter Medications as of 04/11/2022  ?Medication Sig  ? acetaminophen (TYLENOL) 325 MG tablet Take 2 tablets (650 mg total) by mouth every 6 (six) hours as needed for mild pain.  ? albuterol (VENTOLIN HFA) 108 (90 Base) MCG/ACT inhaler INHALE TWO PUFFS BY MOUTH EVERY 6 HOURS AS NEEDED FOR WHEEZING  ? amLODipine (NORVASC) 10 MG tablet TAKE ONE TABLET BY MOUTH DAILY  ? aspirin EC 325 MG EC tablet Take 1 tablet (325 mg total) by mouth daily.  ? atorvastatin (LIPITOR) 80 MG tablet TAKE ONE TABLET BY MOUTH DAILY  ? divalproex (DEPAKOTE ER) 500 MG 24 hr tablet Take 500 mg by mouth 3 (three) times daily. 2- 3 tablets once daily  ? finasteride (PROSCAR) 5 MG tablet TAKE ONE TABLET BY MOUTH DAILY  ? fluticasone (FLONASE) 50 MCG/ACT nasal spray SPRAY TWO SPRAYS IN THE AFFECTED NOSTRIL DAILY  ? Fluticasone-Salmeterol (ADVAIR DISKUS) 500-50 MCG/DOSE AEPB Inhale 1 puff into the lungs every 12 (twelve) hours.  ? guaiFENesin 200 MG tablet Take 200 mg by mouth every 4 (four) hours as needed for cough or to loosen phlegm.  ? lamoTRIgine (LAMICTAL) 200 MG tablet Take 1 tablet (200 mg total) by mouth every morning.  ? lisinopril-hydrochlorothiazide (ZESTORETIC) 20-12.5 MG tablet Take 1 tablet by mouth every morning.  ? Multiple Vitamins-Minerals (MULTIVITAMIN ADULTS 50+ PO)  Take 1 tablet by mouth daily.   ? nicotine (NICODERM CQ - DOSED IN MG/24 HOURS) 21 mg/24hr patch Place 21 mg onto the skin daily. (Patient not taking: Reported on 12/26/2021)  ? ?No facility-administered encounter medications on file as of 04/11/2022.  ? ? ?Allergies (verified) ?Codeine  ? ?History: ?Past Medical History:  ?Diagnosis Date  ? Alcohol abuse   ? Aortic stenosis   ? Arthritis   ? Asthma   ? exercise indused or pollen-uses inhaler dail yand has rescue inhaler if needed  ? Bipolar disorder ([place])    ? CAD (coronary artery disease)   ? coronary calcifications 08/2013 CTA  ? Carotid artery occlusion   ? Cataract   ? bilateral sx  ? Chronic back pain   ? Claudication [facility])   ? Complication of anesthesia 10/16/2020  ? "had a panic attack when the mask was put on me"" I do not think I was given anything  before, I need something to calm me down"  ? Depression   ? Family history of skin cancer   ? Heart murmur   ? as an infant  ? Hepatitis   ? h/o 1970,doesn't remember which type,1990 epstain-barr  ? Hyperlipidemia   ? Hypertension   ? Neuromuscular disorder ([place])   ? Obsessive compulsive disorder   ? PAD (peripheral artery disease) ([place])   ? Peripheral neuropathy   ? Pneumonia   ? RBBB   ? RBBB (right bundle branch block with left anterior fascicular block)   ? Seasonal allergies   ? Severe aortic stenosis   ? Sleep apnea   ? does not wear CPAP  ? Smoker   ? Spinal stenosis at L4-L5 level   ? Tobacco abuse   ? Tobacco use disorder   ? ?Past Surgical History:  ?Procedure Laterality Date  ? ANAL FISTULECTOMY  09/25/2011  ? AORTA - BILATERAL FEMORAL ARTERY BYPASS GRAFT N/A 10/03/2020  ? Procedure: AORTA BIFEMORAL BYPASS GRAFT USING A HEMASHIELD GOLD BIFURCATED 14 X 28m GRAFT AND INFERIOR MESENTERIC ARTERY REIMPLANTATION;  Surgeon: BSerafina Mitchell MD;  [facility]  Service: Vascular;  Laterality: N/A;  ? AORTIC VALVE REPLACEMENT N/A 11/24/2019  ? Procedure: AORTIC VALVE REPLACEMENT (AVR) using INSPIRIS Resilia 23 MM Bioprosthetic Aortic Valve.;  Surgeon: BGaye Pollack MD;  [facility];  Service: Open Heart Surgery;  Laterality: N/A;  ? APPLICATION OF WOUND VAC Left 10/17/2020  ? Procedure: APPLICATION OF WOUND VAC;  Surgeon: BSerafina Mitchell MD;  [facility]  Service: Vascular;  Laterality: Left;  ? BACK SURGERY    ? 2015  ? CATARACT EXTRACTION, BILATERAL  2022  ? COLONOSCOPY  2009  ? MS-F/V-mov(exc)-HPP  ? CORONARY ARTERY BYPASS GRAFT N/A 11/24/2019  ? Procedure: CORONARY ARTERY BYPASS GRAFTING  (CABG) using LIMA to LAD.;  Surgeon: BGaye Pollack MD;  [facility];  Service: Open Heart Surgery;  Laterality: N/A;  ? ELBOW SURGERY    ? bilaterally for cubital tunnel  ? ENDARTERECTOMY N/A 10/03/2020  ? Procedure: AORTIC ENDARTERECTOMY;  Surgeon: BSerafina Mitchell MD;  [facility];  Service: Vascular;  Laterality: N/A;  ? ENDARTERECTOMY FEMORAL Bilateral 10/03/2020  ? Procedure: BILATERAL FEMORAL ENDARTERECTOMY;  Surgeon: BSerafina Mitchell MD;  [facility]  Service: Vascular;  Laterality: Bilateral;  ? HERNIA REPAIR    ? with mesh  ? INCISION AND DRAINAGE Left 01/02/2021  ? Procedure: INCISION AND DRAINAGE LEFT GROIN WITH STIMULAN BEADS;  Surgeon: BHarold Barban  W, MD;  Location: [place];  Service: Vascular;  Laterality: Left;  ? INCISION AND DRAINAGE OF WOUND Left 10/17/2020  ? Procedure: EXPLORATION LEFT GROIN WOUND;  Surgeon: Serafina Mitchell, MD;  [facility];  Service: Vascular;  Laterality: Left;  ? MAXIMUM ACCESS (MAS)POSTERIOR LUMBAR INTERBODY FUSION (PLIF) 1 LEVEL N/A 10/25/2014  ? Procedure: LUMBAR FOUR TO FIVE MAXIMUM ACCESS (MAS) POSTERIOR LUMBAR INTERBODY FUSION (PLIF) 1 LEVEL;  Surgeon: Eustace Moore, MD;  Location: [place] [facility];  Service: Neurosurgery;  Laterality: N/A;  ? RIGHT/LEFT HEART CATH AND CORONARY ANGIOGRAPHY N/A 11/10/2019  ? Procedure: RIGHT/LEFT HEART CATH AND CORONARY ANGIOGRAPHY;  Surgeon: [name], [name], MD;  Location: [place] CV LAB;  Service: Cardiovascular;  Laterality: N/A;  ? SKIN BIOPSY Left 10/12/2018  ? shave forehead Hypertrophic actinic kertosis with features of a verruca  ? TEE WITHOUT CARDIOVERSION N/A 11/24/2019  ? Procedure: TRANSESOPHAGEAL ECHOCARDIOGRAM (TEE);  Surgeon: Gaye Pollack, MD;  Location: [place];  Service: Open Heart Surgery;  Laterality: N/A;  ? TONSILLECTOMY  age 79  ? VASECTOMY    ? X 2  ? VASECTOMY REVERSAL    ? ?Family History  ?Problem Relation Age of Onset  ? Colon polyps Mother 12  ? Heart failure Mother   ? Stroke Father 37   ? Cancer Father   ?     skin  ? Drug abuse Father   ? Hypertension Brother   ? Diabetes Brother   ? Drug abuse Brother   ? Heart disease Brother   ?     s/p CABG, pacemaker  ? Cancer Maternal Aunt   ?

## 2022-04-15 ENCOUNTER — Ambulatory Visit (INDEPENDENT_AMBULATORY_CARE_PROVIDER_SITE_OTHER): Payer: PPO | Admitting: Family Medicine

## 2022-04-15 ENCOUNTER — Encounter: Payer: Self-pay | Admitting: Family Medicine

## 2022-04-15 VITALS — BP 118/66 | HR 66 | Temp 97.5°F | Ht 65.5 in | Wt 140.6 lb

## 2022-04-15 DIAGNOSIS — I10 Essential (primary) hypertension: Secondary | ICD-10-CM

## 2022-04-15 DIAGNOSIS — J4530 Mild persistent asthma, uncomplicated: Secondary | ICD-10-CM | POA: Diagnosis not present

## 2022-04-15 DIAGNOSIS — N401 Enlarged prostate with lower urinary tract symptoms: Secondary | ICD-10-CM

## 2022-04-15 DIAGNOSIS — Z95828 Presence of other vascular implants and grafts: Secondary | ICD-10-CM

## 2022-04-15 DIAGNOSIS — G4733 Obstructive sleep apnea (adult) (pediatric): Secondary | ICD-10-CM | POA: Diagnosis not present

## 2022-04-15 DIAGNOSIS — Z951 Presence of aortocoronary bypass graft: Secondary | ICD-10-CM

## 2022-04-15 DIAGNOSIS — F17200 Nicotine dependence, unspecified, uncomplicated: Secondary | ICD-10-CM

## 2022-04-15 DIAGNOSIS — F1021 Alcohol dependence, in remission: Secondary | ICD-10-CM

## 2022-04-15 DIAGNOSIS — J309 Allergic rhinitis, unspecified: Secondary | ICD-10-CM

## 2022-04-15 DIAGNOSIS — I739 Peripheral vascular disease, unspecified: Secondary | ICD-10-CM | POA: Diagnosis not present

## 2022-04-15 DIAGNOSIS — F429 Obsessive-compulsive disorder, unspecified: Secondary | ICD-10-CM | POA: Diagnosis not present

## 2022-04-15 DIAGNOSIS — E785 Hyperlipidemia, unspecified: Secondary | ICD-10-CM | POA: Diagnosis not present

## 2022-04-15 DIAGNOSIS — F79 Unspecified intellectual disabilities: Secondary | ICD-10-CM

## 2022-04-15 DIAGNOSIS — J301 Allergic rhinitis due to pollen: Secondary | ICD-10-CM

## 2022-04-15 DIAGNOSIS — Z953 Presence of xenogenic heart valve: Secondary | ICD-10-CM

## 2022-04-15 DIAGNOSIS — Z981 Arthrodesis status: Secondary | ICD-10-CM

## 2022-04-15 DIAGNOSIS — Z0000 Encounter for general adult medical examination without abnormal findings: Secondary | ICD-10-CM | POA: Diagnosis not present

## 2022-04-15 DIAGNOSIS — R062 Wheezing: Secondary | ICD-10-CM

## 2022-04-15 DIAGNOSIS — R3914 Feeling of incomplete bladder emptying: Secondary | ICD-10-CM

## 2022-04-15 MED ORDER — FINASTERIDE 5 MG PO TABS: 5.0000 mg | ORAL_TABLET | Freq: Every day | ORAL | 3 refills | Status: DC

## 2022-04-15 MED ORDER — AMLODIPINE BESYLATE 10 MG PO TABS: 10.0000 mg | ORAL_TABLET | Freq: Every day | ORAL | 3 refills | Status: DC

## 2022-04-15 MED ORDER — LISINOPRIL-HYDROCHLOROTHIAZIDE 20-12.5 MG PO TABS: 1.0000 | ORAL_TABLET | Freq: Every morning | ORAL | 2 refills | Status: DC

## 2022-04-15 MED ORDER — ATORVASTATIN CALCIUM 80 MG PO TABS: 80.0000 mg | ORAL_TABLET | Freq: Every day | ORAL | 3 refills | Status: DC

## 2022-04-15 NOTE — Progress Notes (Signed)
? ?Subjective:  ? ? Patient ID: Gregory [name], male    DOB: 05/25/1951, 71 y.o.   MRN: [id number] ? ?HPI ?He is here for complete examination.  He has a very extensive history of cardiovascular and peripheral vascular disease.  He is followed by vascular surgery as well as cardiology but has not been seen by cardiology in over a year.  He has had CABG, bypass, valve replacement, PAD.  He does have underlying allergies and has been seen by ENT.  There was a recommendation to get allergist referral to get this under better control.  He also has a history of OSA and has tried CPAP on several occasions but have been able to really tolerate that.  He does have BPH and is on Proscar which seems to be helping with his urinary symptoms.  He does have exercise-induced asthma and does use Proventil on an as-needed basis which seems to control that.  He has been alcohol free for at least 8 years.  Does have a history of multiple adenomatous colonic polyps.  Apparently he will need to be seen more frequently than every 3 years.  He has seen Dr. Fuller Plan for this.  He does continue to smoke.  He does not complain of cough, fever, chills, shortness of breath.  He has had previous back surgery but seems to be doing well with that.  Presently he is really not having any symptoms of chest pain, shortness of breath, leg pain, weakness. ?He continues on atorvastatin and having no difficulty with that.  Continues on lisinopril/HCTZ and amlodipine for his blood pressure.  He does follow-up regularly with his psychiatrist and does need a Depakote level drawn.  That seems to be fairly stable.  Otherwise family and social history as well as health maintenance and immunizations was reviewed ? ?Review of Systems  ?All other systems reviewed and are negative. ? ?   ?Objective:  ? Physical Exam ?Alert and in no distress. Tympanic membranes and canals are normal. Pharyngeal area is normal. Neck is supple without adenopathy or thyromegaly.  Cardiac exam shows a regular sinus rhythm without murmurs or gallops. Lungs show mainly right-sided wheezing. ? ? ? ? ?   ?Assessment & Plan:  ?Routine general medical examination at a health care facility - Plan: CBC with Differential/Platelet, Comprehensive metabolic panel, Lipid panel ? ?Essential hypertension ? ?Allergic rhinitis, unspecified seasonality, unspecified trigger - Plan: Ambulatory referral to Allergy ? ?S/P aortobifemoral bypass surgery ? ?S/P CABG x 1 - Plan: Ambulatory referral to Cardiology ? ?S/P aortic valve replacement with bioprosthetic valve - Plan: Ambulatory referral to Cardiology ? ?PAD (peripheral artery disease) ([place]) - Plan: Ambulatory referral to Cardiology ? ?Mild persistent asthma, unspecified whether complicated ? ?Recovering alcoholic in remission [facility]) ? ?Obsessive-compulsive disorder, unspecified type - Plan: Valproic acid level ? ?Smoker - Plan: DG Chest 2 View ? ?OSA (obstructive sleep apnea) ? ?Primary hypertension ? ?Hyperlipidemia with target LDL less than 130 ? ?S/P lumbar spinal fusion ? ?Allergic rhinitis due to pollen, unspecified seasonality ? ?Mentally disabled ? ?Wheezing - Plan: DG Chest 2 View ?Overall he does seem to be relatively stable with all the previous surgeries that he has had done for his vascular issues as well as on his back.  Encouraged him to quit smoking.  He is to continue on his medications including 1 BASA per day.  I will refer to allergist.  He Is probably a candidate for Inspire.  He is to discuss this with his  ENT and if that is not a procedure done by her then I will refer to Dr. Redmond Baseman.  He will follow-up with Dr. Fuller Plan concerning the polyps.  Follow-up with psychiatry.  Time for general checkup with cardiology. ? ?

## 2022-04-16 ENCOUNTER — Telehealth: Payer: Self-pay | Admitting: Family Medicine

## 2022-04-16 LAB — COMPREHENSIVE METABOLIC PANEL
ALT: 25 IU/L (ref 0–44)
AST: 31 IU/L (ref 0–40)
Albumin/Globulin Ratio: 1.8 (ref 1.2–2.2)
Albumin: 4.4 g/dL (ref 3.7–4.7)
Alkaline Phosphatase: 64 IU/L (ref 44–121)
BUN/Creatinine Ratio: 17 (ref 10–24)
BUN: 16 mg/dL (ref 8–27)
Bilirubin Total: 0.3 mg/dL (ref 0.0–1.2)
CO2: 25 mmol/L (ref 20–29)
Calcium: 9.6 mg/dL (ref 8.6–10.2)
Chloride: 100 mmol/L (ref 96–106)
Creatinine, Ser: 0.96 mg/dL (ref 0.76–1.27)
Globulin, Total: 2.5 g/dL (ref 1.5–4.5)
Glucose: 120 mg/dL — ABNORMAL HIGH (ref 70–99)
Potassium: 4.5 mmol/L (ref 3.5–5.2)
Sodium: 140 mmol/L (ref 134–144)
Total Protein: 6.9 g/dL (ref 6.0–8.5)
eGFR: 85 mL/min/{1.73_m2} (ref >59)

## 2022-04-16 LAB — CBC WITH DIFFERENTIAL/PLATELET
Basophils Absolute: 0.1 10*3/uL (ref 0.0–0.2)
Basos: 1 %
EOS (ABSOLUTE): 0.2 10*3/uL (ref 0.0–0.4)
Eos: 3 %
Hematocrit: 39 % (ref 37.5–51.0)
Hemoglobin: 13.9 g/dL (ref 13.0–17.7)
Immature Grans (Abs): 0 10*3/uL (ref 0.0–0.1)
Immature Granulocytes: 0 %
Lymphocytes Absolute: 2.1 10*3/uL (ref 0.7–3.1)
Lymphs: 32 %
MCH: 31.8 pg (ref 26.6–33.0)
MCHC: 35.6 g/dL (ref 31.5–35.7)
MCV: 89 fL (ref 79–97)
Monocytes Absolute: 0.8 10*3/uL (ref 0.1–0.9)
Monocytes: 12 %
Neutrophils Absolute: 3.3 10*3/uL (ref 1.4–7.0)
Neutrophils: 52 %
Platelets: 170 10*3/uL (ref 150–450)
RBC: 4.37 x10E6/uL (ref 4.14–5.80)
RDW: 13.5 % (ref 11.6–15.4)
WBC: 6.4 10*3/uL (ref 3.4–10.8)

## 2022-04-16 LAB — LIPID PANEL
Chol/HDL Ratio: 2.6 ratio (ref 0.0–5.0)
Cholesterol, Total: 91 mg/dL — ABNORMAL LOW (ref 100–199)
HDL: 35 mg/dL — ABNORMAL LOW (ref >39)
LDL Chol Calc (NIH): 33 mg/dL (ref 0–99)
Triglycerides: 126 mg/dL (ref 0–149)
VLDL Cholesterol Cal: 23 mg/dL (ref 5–40)

## 2022-04-16 LAB — VALPROIC ACID LEVEL: Valproic Acid Lvl: 53 ug/mL (ref 50–100)

## 2022-04-16 NOTE — Telephone Encounter (Signed)
Pt called and left a message advising that he received a call from [facility]. He states that she wanted to know what inhalers he is currently taking. PT states he is own albuterol and advair.  ?

## 2022-04-17 ENCOUNTER — Ambulatory Visit
Admission: RE | Admit: 2022-04-17 | Discharge: 2022-04-17 | Disposition: A | Discharge status: Home / Self Care | Payer: PPO | Source: Ambulatory Visit | Attending: Family Medicine | Admitting: Family Medicine

## 2022-04-17 DIAGNOSIS — R062 Wheezing: Secondary | ICD-10-CM | POA: Diagnosis not present

## 2022-04-20 NOTE — Progress Notes (Signed)
? ?New Patient Note ? ?RE: Gregory [name] MRN: [id number] DOB: 06/19/1951 ?Date of Office Visit: 04/21/2022 ? ?Consult requested by: [name], MD ?Primary care provider: [name], MD ? ?Chief Complaint: Establish Care and Nasal Congestion (With sinus pressure--cough and wheezing (chest xray done)--getting worse--saw ENT on April) ? ?History of Present Illness: ?I had the pleasure of seeing Gregory [name] for initial evaluation at the [facility] on 04/21/2022. He is a 71 y.o. male, who is referred here by [name], MD for the evaluation of allergic rhinitis. ? ?He reports symptoms of nasal congestion, sinus pressure, PND, rhinorrhea, sneezing, itchy eyes, coughing, wheezing. Symptoms have been going on for many years. The symptoms are present all year around with worsening during weather changes. Other triggers include exposure to unknown. Anosmia: no. Headache: yes. He has used Flonase 2 sprays per nostril once a day, benadryl, Claritin with minimal improvement in symptoms. Sinus infections: yes but questionable. Previous work up includes: yes starting at age 89 and last was in 81s. Patient was on AIT from age 89 to 52 with some benefit.  ?Previous ENT evaluation: yes in April 2023 but not surgical candidate.  ?Had deviated septum surgery in the 1960s. Patient also had multiple nasal fractures in the past.  ?Previous sinus imaging: yes. ?History of nasal polyps: yes. ?Last eye exam: in October 2022 s/p cataract surgery. ?History of reflux: no. ? ?He reports symptoms of shortness of breath, coughing, wheezing for many years - diagnosed at age 67. Current medications include albuterol prn which help and Advair 544mg 1 puff BID x few years. He reports not using aerochamber with inhalers. He tried the following inhalers: nebulizer. Main triggers are allergies, exercise. In the last month, frequency of symptoms: depends. Frequency of nocturnal symptoms: 0x/month. Frequency of  SABA use: depends. Interference with physical activity: not sure. In the last 12 months, emergency room visits/urgent care visits/doctor office visits or hospitalizations due to respiratory issues: no. In the last 12 months, oral steroids courses: no. Lifetime history of hospitalization for respiratory issues: not sure. Asthma was diagnosed at age 71 History of pneumonia: yes. He was evaluated by allergist in the past. Smoking exposure: 1/2 ppd x 50+. Up to date with flu vaccine: yes. Up to date with pneumonia vaccine: yes. Up to date with COVID-19 vaccine: yes. Prior Covid-19 infection: no. ? ?03/28/2022 ENT visit: ?"JKhiree Bukhariis a 71y.o. male with several month history of intermittent facial pressure and congestion. Patient has completed treatment with Augmentin and Biaxin with temporary improvement in his symptoms. He had maxillofacial CT scan performed in December with findings as above. Nasal endoscopy was performed today, which demonstrated clear rhinorrhea with polypoid degeneration of turbinates, no evidence of nasal polyposis or frank purulent drainage. I recommended treatment with oral prednisone taper, and trial of Xhance nasal spray for his symptoms. I also recommend referral to allergy and immunology for testing. Patient will return for follow-up in approximately 4 to 6 weeks. Further recommendations pending response to prescribed treatment." ? ?01/03/2022 CT sinus: ?"IMPRESSION: ?Paranasal sinus disease, as outlined. Most notably, moderate ?polypoid mucosal thickening is present within the right maxillary ?sinus. ?  ?Mucosal thickening narrows the sphenoid sinus ostia, bilaterally. ?  ?The right maxillary sinus ostium is narrowed by mucosal thickening ?and the presence of a Haller cell. ?  ?The left maxillary sinus ostium is narrowed due to the presence of a ?Haller cell. ?  ?Leftward deviation of the nasal  septum. ?  ?Mild mucosal thickening within the bilateral nasal passages. ?Superimposed  small-volume secretions within the right nasal passage." ? ? ?Assessment and Plan: ?Gregory [name] is a 71 y.o. male with: ?Chronic rhinitis ?Perennial rhinoconjunctivitis symptoms for many years.  Tried Flonase, antihistamines with minimal benefit.  Was on AIT from age [age] with some benefit.  No recent allergy evaluation.  Saw ENT in 2023 but not a surgical candidate.  Patient had sinus surgery in the 1960s.  2023 CT sinus showed polypoid mucosal thickening, septum deviation. Xhance not covered. Smoking since age 8. ?Unable to skin test today as patient had to go back to work and will get bloodwork as need to get bloodwork for other reasons as well.  ?Will make additional recommendations based on results. ?Use over the counter antihistamines such as Zyrtec (cetirizine), Claritin (loratadine), Allegra (fexofenadine), or Xyzal (levocetirizine) daily as needed. May take twice a day during allergy flares. May switch antihistamines every few months. ?Use Flonase (fluticasone) nasal spray 2 sprays per nostril twice a day as needed for nasal congestion.  ?Use azelastine nasal spray 1-2 sprays per nostril twice a day as needed for runny nose/drainage. ?Nasal saline spray (i.e., Simply Saline) or nasal saline lavage (i.e., NeilMed) is recommended as needed and prior to medicated nasal sprays. ?Consider Dupixent for the polypoid nasal changes.  ? ?Asthma-COPD overlap syndrome ([place]) ?Diagnosed with asthma at age 22.  Currently on Advair 500 mcg 1 puff twice a day.  Uses albuterol prior to exertion.  Smoked since age 2. ?Today's spirometry showed severe restriction with no improvement in FEV1 postbronchodilator treatment.  Clinically feeling improved. ?Most likely has component of COPD as well.  ?Daily controller medication(s): start Trelegy 287mg 1 puff once a day and rinse mouth after each use. ?This replaces Advair for now.  ?Prior to physical activity: May use albuterol rescue inhaler 2 puffs 5 to 15 minutes prior to  strenuous physical activities. ?Rescue medications: May use albuterol rescue inhaler 2 puffs or nebulizer every 4 to 6 hours as needed for shortness of breath, chest tightness, coughing, and wheezing. Monitor frequency of use.  ?Discussed smoking cessation. ?Get spirometry at next visit. ? ?Recurrent infections ?Multiple "sinus infections". No prior immune work up. ?Keep track of infections/antibiotics use. ?Get bloodwork to look at immune system. ? ?Return in about 2 months (around 06/21/2022). ? ?Meds ordered this encounter  ?Medications  ? azelastine (ASTELIN) 0.1 % nasal spray  ?  Sig: Place 1-2 sprays into both nostrils 2 (two) times daily as needed (nasal drainage). Use in each nostril as directed  ?  Dispense:  30 mL  ?  Refill:  5  ? Fluticasone-Umeclidin-Vilant (TRELEGY ELLIPTA) 200-62.5-25 MCG/ACT AEPB  ?  Sig: Inhale 1 puff into the lungs daily. Rinse mouth after each use.  ?  Dispense:  60 each  ?  Refill:  3  ? ?Lab Orders    ?     Allergens w/Total IgE Area 2    ?     Alpha-1-antitrypsin    ?     Diphtheria / Tetanus Antibody Panel    ?     IgG, IgA, IgM    ?     Strep pneumoniae 23 Serotypes IgG    ? ? ?Other allergy screening: ?Food allergy: no ?Medication allergy: yes ?Codeine - allergic reaction requiring ER visit.  ?Patient has taken Tylenol #3 since then and has some milder reactions - facial itching/flushing.  ?Hymenoptera allergy: no ?Urticaria: yes in the past - ?  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 ?Eczema:no ?History of recurrent infections suggestive of immunodeficency:  ?Multiple sinus infections - took amoxicillin, clindamycin and augmentin. ? ?Diagnostics: ?Spirometry:  ?Tracings reviewed. His effort: It was hard to get consistent efforts and there is a question as to whether this reflects a maximal maneuver. ?FVC: 1.78L ?FEV1: 1.32L, 49% predicted ?FEV1/FVC ratio: 74% ?Interpretation: Spirometry consistent with severe restrictive disease with no improvement in  FEV1 post bronchodilator treatment. Clinically feeling improved. Wheezing resolved.  ?  ?Please see scanned spirometry results for details. ? ?Past Medical History: ?Patient Active Problem List  ? Diagnosis Date

## 2022-04-21 ENCOUNTER — Ambulatory Visit: Payer: PPO | Admitting: Allergy

## 2022-04-21 ENCOUNTER — Encounter: Payer: Self-pay | Admitting: Allergy

## 2022-04-21 VITALS — BP 120/60 | HR 64 | Temp 98.2°F | Resp 18 | Ht 65.5 in | Wt 140.5 lb

## 2022-04-21 DIAGNOSIS — J449 Chronic obstructive pulmonary disease, unspecified: Secondary | ICD-10-CM

## 2022-04-21 DIAGNOSIS — B999 Unspecified infectious disease: Secondary | ICD-10-CM | POA: Diagnosis not present

## 2022-04-21 DIAGNOSIS — Z720 Tobacco use: Secondary | ICD-10-CM

## 2022-04-21 DIAGNOSIS — J310 Chronic rhinitis: Secondary | ICD-10-CM | POA: Diagnosis not present

## 2022-04-21 DIAGNOSIS — J4550 Severe persistent asthma, uncomplicated: Secondary | ICD-10-CM | POA: Diagnosis not present

## 2022-04-21 MED ORDER — TRELEGY ELLIPTA 200-62.5-25 MCG/ACT IN AEPB: 1.0000 | INHALATION_SPRAY | Freq: Every day | RESPIRATORY_TRACT | 3 refills | Status: DC

## 2022-04-21 MED ORDER — AZELASTINE HCL 0.1 % NA SOLN: 1.0000 | Freq: Two times a day (BID) | NASAL | 5 refills | Status: DC | PRN

## 2022-04-21 NOTE — Assessment & Plan Note (Addendum)
Perennial rhinoconjunctivitis symptoms for many years.  Tried Flonase, antihistamines with minimal benefit.  Was on AIT from age [age] with some benefit.  No recent allergy evaluation.  Saw ENT in 2023 but not a surgical candidate.  Patient had sinus surgery in the 1960s.  2023 CT sinus showed polypoid mucosal thickening, septum deviation. Xhance not covered. Smoking since age 51. ?? Unable to skin test today as patient had to go back to work and will get bloodwork as need to get bloodwork for other reasons as well.  ?? Will make additional recommendations based on results. ?? Use over the counter antihistamines such as Zyrtec (cetirizine), Claritin (loratadine), Allegra (fexofenadine), or Xyzal (levocetirizine) daily as needed. May take twice a day during allergy flares. May switch antihistamines every few months. ?? Use Flonase (fluticasone) nasal spray 2 sprays per nostril twice a day as needed for nasal congestion.  ?? Use azelastine nasal spray 1-2 sprays per nostril twice a day as needed for runny nose/drainage. ?? Nasal saline spray (i.e., Simply Saline) or nasal saline lavage (i.e., NeilMed) is recommended as needed and prior to medicated nasal sprays. ?? Consider Dupixent for the polypoid nasal changes.  ?

## 2022-04-21 NOTE — Assessment & Plan Note (Signed)
Diagnosed with asthma at age 71.  Currently on Advair 500 mcg 1 puff twice a day.  Uses albuterol prior to exertion.  Smoked since age 48. ?? Today's spirometry showed severe restriction with no improvement in FEV1 postbronchodilator treatment.  Clinically feeling improved. ?? Most likely has component of COPD as well.  ?? Daily controller medication(s): start Trelegy 264mg 1 puff once a day and rinse mouth after each use. ?o This replaces Advair for now.  ?? Prior to physical activity: May use albuterol rescue inhaler 2 puffs 5 to 15 minutes prior to strenuous physical activities. ?? Rescue medications: May use albuterol rescue inhaler 2 puffs or nebulizer every 4 to 6 hours as needed for shortness of breath, chest tightness, coughing, and wheezing. Monitor frequency of use.  ?? Discussed smoking cessation. ?? Get spirometry at next visit. ?

## 2022-04-21 NOTE — Patient Instructions (Addendum)
Rhinitis:  ?Use over the counter antihistamines such as Zyrtec (cetirizine), Claritin (loratadine), Allegra (fexofenadine), or Xyzal (levocetirizine) daily as needed. May take twice a day during allergy flares. May switch antihistamines every few months. ?Use Flonase (fluticasone) nasal spray 2 sprays per nostril twice a day as needed for nasal congestion.  ?Use azelastine nasal spray 1-2 sprays per nostril twice a day as needed for runny nose/drainage. ?Nasal saline spray (i.e., Simply Saline) or nasal saline lavage (i.e., NeilMed) is recommended as needed and prior to medicated nasal sprays. ? ?Asthma: ?Daily controller medication(s): start Trelegy 254mg 1 puff once a day and rinse mouth after each use. ?This replaces Advair for now.  ?Prior to physical activity: May use albuterol rescue inhaler 2 puffs 5 to 15 minutes prior to strenuous physical activities. ?Rescue medications: May use albuterol rescue inhaler 2 puffs or nebulizer every 4 to 6 hours as needed for shortness of breath, chest tightness, coughing, and wheezing. Monitor frequency of use.  ?Asthma control goals:  ?Full participation in all desired activities (may need albuterol before activity) ?Albuterol use two times or less a week on average (not counting use with activity) ?Cough interfering with sleep two times or less a month ?Oral steroids no more than once a year ?No hospitalizations  ? ?Recommend decreasing smoking.  ? ?Follow up in 2 months or sooner if needed.   ?

## 2022-04-21 NOTE — Assessment & Plan Note (Signed)
Multiple "sinus infections". No prior immune work up. ?? Keep track of infections/antibiotics use. ?? Get bloodwork to look at immune system. ?

## 2022-04-25 DIAGNOSIS — J449 Chronic obstructive pulmonary disease, unspecified: Secondary | ICD-10-CM | POA: Diagnosis not present

## 2022-04-25 DIAGNOSIS — F17200 Nicotine dependence, unspecified, uncomplicated: Secondary | ICD-10-CM | POA: Diagnosis not present

## 2022-04-25 DIAGNOSIS — I10 Essential (primary) hypertension: Secondary | ICD-10-CM | POA: Diagnosis not present

## 2022-04-25 DIAGNOSIS — F319 Bipolar disorder, unspecified: Secondary | ICD-10-CM | POA: Diagnosis not present

## 2022-04-25 DIAGNOSIS — E785 Hyperlipidemia, unspecified: Secondary | ICD-10-CM | POA: Diagnosis not present

## 2022-04-28 DIAGNOSIS — J309 Allergic rhinitis, unspecified: Secondary | ICD-10-CM | POA: Diagnosis not present

## 2022-04-28 DIAGNOSIS — G4733 Obstructive sleep apnea (adult) (pediatric): Secondary | ICD-10-CM | POA: Diagnosis not present

## 2022-04-28 DIAGNOSIS — J331 Polypoid sinus degeneration: Secondary | ICD-10-CM | POA: Diagnosis not present

## 2022-04-28 DIAGNOSIS — J342 Deviated nasal septum: Secondary | ICD-10-CM | POA: Diagnosis not present

## 2022-04-28 DIAGNOSIS — J343 Hypertrophy of nasal turbinates: Secondary | ICD-10-CM | POA: Diagnosis not present

## 2022-04-28 LAB — STREP PNEUMONIAE 23 SEROTYPES IGG
Pneumo Ab Type 1*: 1.3 ug/mL — ABNORMAL LOW (ref >1.3)
Pneumo Ab Type 12 (12F)*: 0.5 ug/mL — ABNORMAL LOW (ref >1.3)
Pneumo Ab Type 14*: 12.9 ug/mL (ref >1.3)
Pneumo Ab Type 17 (17F)*: 15.5 ug/mL (ref >1.3)
Pneumo Ab Type 19 (19F)*: 19.3 ug/mL (ref >1.3)
Pneumo Ab Type 2*: 3.1 ug/mL (ref >1.3)
Pneumo Ab Type 20*: 4.1 ug/mL (ref >1.3)
Pneumo Ab Type 22 (22F)*: 0.4 ug/mL — ABNORMAL LOW (ref >1.3)
Pneumo Ab Type 23 (23F)*: 1.5 ug/mL (ref >1.3)
Pneumo Ab Type 26 (6B)*: 2.3 ug/mL (ref >1.3)
Pneumo Ab Type 3*: 0.5 ug/mL — ABNORMAL LOW (ref >1.3)
Pneumo Ab Type 34 (10A)*: 0.4 ug/mL — ABNORMAL LOW (ref >1.3)
Pneumo Ab Type 4*: 0.1 ug/mL — ABNORMAL LOW (ref >1.3)
Pneumo Ab Type 43 (11A)*: 1.2 ug/mL — ABNORMAL LOW (ref >1.3)
Pneumo Ab Type 5*: 7.5 ug/mL (ref >1.3)
Pneumo Ab Type 51 (7F)*: 6.9 ug/mL (ref >1.3)
Pneumo Ab Type 54 (15B)*: 0.4 ug/mL — ABNORMAL LOW (ref >1.3)
Pneumo Ab Type 56 (18C)*: 1.1 ug/mL — ABNORMAL LOW (ref >1.3)
Pneumo Ab Type 57 (19A)*: 4 ug/mL (ref >1.3)
Pneumo Ab Type 68 (9V)*: 0.6 ug/mL — ABNORMAL LOW (ref >1.3)
Pneumo Ab Type 70 (33F)*: 0.8 ug/mL — ABNORMAL LOW (ref >1.3)
Pneumo Ab Type 8*: 0.4 ug/mL — ABNORMAL LOW (ref >1.3)
Pneumo Ab Type 9 (9N)*: 0.5 ug/mL — ABNORMAL LOW (ref >1.3)

## 2022-04-28 LAB — ALLERGENS W/TOTAL IGE AREA 2
Alternaria Alternata IgE: 0.2 kU/L — AB
Aspergillus Fumigatus IgE: <0.1 kU/L
Bermuda Grass IgE: <0.1 kU/L
Cat Dander IgE: 0.12 kU/L — AB
Cedar, Mountain IgE: <0.1 kU/L
Cladosporium Herbarum IgE: <0.1 kU/L
Cockroach, German IgE: 0.11 kU/L — AB
Common Silver Birch IgE: <0.1 kU/L
Cottonwood IgE: <0.1 kU/L
D Farinae IgE: 0.34 kU/L — AB
D Pteronyssinus IgE: 0.37 kU/L — AB
Dog Dander IgE: <0.1 kU/L
Elm, American IgE: <0.1 kU/L
IgE (Immunoglobulin E), Serum: 783 IU/mL — ABNORMAL HIGH (ref 6–495)
Johnson Grass IgE: <0.1 kU/L
Maple/Box Elder IgE: <0.1 kU/L
Mouse Urine IgE: <0.1 kU/L
Oak, White IgE: <0.1 kU/L
Pecan, Hickory IgE: <0.1 kU/L
Penicillium Chrysogen IgE: <0.1 kU/L
Pigweed, Rough IgE: <0.1 kU/L
Ragweed, Short IgE: <0.1 kU/L
Sheep Sorrel IgE Qn: <0.1 kU/L
Timothy Grass IgE: <0.1 kU/L
White Mulberry IgE: <0.1 kU/L

## 2022-04-28 LAB — ALPHA-1-ANTITRYPSIN: A-1 Antitrypsin: 154 mg/dL (ref 101–187)

## 2022-04-28 LAB — IGG, IGA, IGM
IgA/Immunoglobulin A, Serum: 252 mg/dL (ref 61–437)
IgG (Immunoglobin G), Serum: 1059 mg/dL (ref 603–1613)
IgM (Immunoglobulin M), Srm: 461 mg/dL — ABNORMAL HIGH (ref 15–143)

## 2022-04-28 LAB — DIPHTHERIA / TETANUS ANTIBODY PANEL
Diphtheria Ab: 0.94 IU/mL (ref <0.10)
Tetanus Ab, IgG: 1.53 IU/mL (ref <0.10)

## 2022-05-13 ENCOUNTER — Telehealth: Payer: Self-pay | Admitting: Family Medicine

## 2022-05-13 NOTE — Telephone Encounter (Signed)
Pt dropped off xray from the demist and they wanted you to see the Caroid artery that is circled and the dentist wanted you to look over it and refer him to a cardio and it has been over 3 years since he has been to one, he has been to a dr peter [name],   Put xray in your folder,

## 2022-05-22 ENCOUNTER — Ambulatory Visit (INDEPENDENT_AMBULATORY_CARE_PROVIDER_SITE_OTHER): Payer: PPO | Admitting: Family Medicine

## 2022-05-22 ENCOUNTER — Encounter: Payer: Self-pay | Admitting: Family Medicine

## 2022-05-22 VITALS — BP 136/70 | HR 54 | Temp 97.0°F | Wt 143.4 lb

## 2022-05-22 DIAGNOSIS — Z95828 Presence of other vascular implants and grafts: Secondary | ICD-10-CM | POA: Diagnosis not present

## 2022-05-22 DIAGNOSIS — R0989 Other specified symptoms and signs involving the circulatory and respiratory systems: Secondary | ICD-10-CM

## 2022-05-22 DIAGNOSIS — I739 Peripheral vascular disease, unspecified: Secondary | ICD-10-CM

## 2022-05-22 NOTE — Progress Notes (Signed)
   Subjective:    Patient ID: Gregory [name], male    DOB: [birth year], 71 y.o.   MRN: [id number]  HPI He is here for consult concerning a dental x-ray which showed a possible carotid calcium deposition.  He has had no headache, blurred vision, double vision, weakness, numbness tingling. He does have underlying PAD and has had previous bypass grafting.  Review of Systems     Objective:   Physical Exam Alert and in no distress.  Bilateral carotid exam does show bruits present.       Assessment & Plan:  Bilateral carotid bruits - Plan: US Carotid Bilateral  S/P aortobifemoral bypass surgery  PAD (peripheral artery disease) (HCC)

## 2022-06-02 ENCOUNTER — Ambulatory Visit: Payer: PPO | Admitting: Cardiology

## 2022-06-02 ENCOUNTER — Encounter: Payer: Self-pay | Admitting: Cardiology

## 2022-06-02 ENCOUNTER — Ambulatory Visit
Admission: RE | Admit: 2022-06-02 | Discharge: 2022-06-02 | Disposition: A | Discharge status: Home / Self Care | Payer: PPO | Source: Ambulatory Visit | Attending: Family Medicine | Admitting: Family Medicine

## 2022-06-02 VITALS — BP 138/62 | HR 48 | Ht 65.5 in | Wt 142.2 lb

## 2022-06-02 DIAGNOSIS — E7800 Pure hypercholesterolemia, unspecified: Secondary | ICD-10-CM

## 2022-06-02 DIAGNOSIS — I779 Disorder of arteries and arterioles, unspecified: Secondary | ICD-10-CM | POA: Diagnosis not present

## 2022-06-02 DIAGNOSIS — I6502 Occlusion and stenosis of left vertebral artery: Secondary | ICD-10-CM | POA: Diagnosis not present

## 2022-06-02 DIAGNOSIS — I25810 Atherosclerosis of coronary artery bypass graft(s) without angina pectoris: Secondary | ICD-10-CM | POA: Diagnosis not present

## 2022-06-02 DIAGNOSIS — Z720 Tobacco use: Secondary | ICD-10-CM | POA: Diagnosis not present

## 2022-06-02 DIAGNOSIS — I6523 Occlusion and stenosis of bilateral carotid arteries: Secondary | ICD-10-CM | POA: Diagnosis not present

## 2022-06-02 DIAGNOSIS — Z952 Presence of prosthetic heart valve: Secondary | ICD-10-CM

## 2022-06-02 DIAGNOSIS — R0989 Other specified symptoms and signs involving the circulatory and respiratory systems: Secondary | ICD-10-CM | POA: Diagnosis not present

## 2022-06-02 DIAGNOSIS — I10 Essential (primary) hypertension: Secondary | ICD-10-CM | POA: Diagnosis not present

## 2022-06-02 DIAGNOSIS — I771 Stricture of artery: Secondary | ICD-10-CM | POA: Diagnosis not present

## 2022-06-02 MED ORDER — AMOXICILLIN 500 MG PO CAPS: 2000.0000 mg | ORAL_CAPSULE | Freq: Once | ORAL | 11 refills | Status: DC | PRN

## 2022-06-02 MED ORDER — AMOXICILLIN 500 MG PO CAPS: 500.0000 mg | ORAL_CAPSULE | Freq: Once | ORAL | 11 refills | Status: DC | PRN

## 2022-06-02 MED ORDER — ASPIRIN 81 MG PO TBEC: 81.0000 mg | DELAYED_RELEASE_TABLET | Freq: Every day | ORAL | 3 refills | Status: DC

## 2022-06-02 NOTE — Progress Notes (Signed)
Cardiology Office Note   Date:  06/02/2022   ID:  Gregory [name], DOB 1951-12-03, MRN [id number]  PCP:  [name], MD  Cardiologist:   Tyleek Smick [name], MD   Chief Complaint  Patient presents with   Coronary Artery Disease   avr      History of Present Illness: Gregory [name] is a 71 y.o. male who is seen for follow up of  AVR and CABG. He was last seen by me in Dec 2020. He has a known history of coronary disease. This is based on a prior CT of the chest in 2014 showing extensive coronary calcification. He had a Myoview study in October 2014 that showed a small area of apical ischema. This was unchanged from 2012 and the patient was asymptomatic so he was treated medially. He also has a history of aortic stenosis.  He has a history of ongoing tobacco abuse, HTN, and hyperlipidemia. In 2020  he had an Echo done showing his aortic stenosis had progressed and was in the severe range. Mean gradient 50 mm Hg and valve area 0.84 cm2. Peak velocity 4.7 m/sec. This had progressed from last Echo in 2017. He subsequently underwent cardiac cath showing severe AS, single vessel CAD with 99% proximal to mid LAD, normal right heart pressures.   On 11/24/19 he underwent AVR and CABG by Dr Cyndia Bent. This included a LIMA graft to the LAD and AVR with a 23 mm Edwards INSPIRIS RESILIA pericardial valve. His early post op course was complicated by complete heart block. This resolved. He was seen by EP who recommended monitoring. All AV nodal blocking drugs were held. He was started on amlodipine and lisinopril for HTN. A Zio patch monitor was recommended at DC. This showed no arrhythmia and no significant AV block.   He later developed symptoms of claudication. CTA revealed infrarenal aortic occlusion.  On 10/21/2020 he underwent aortobifemoral bypass graft using a 14 x 7 dacryon graft as well as reimplantation of his inferior mesenteric artery by Dr Trula Slade.   He developed a large seroma in his left  groin and went back to the operating room on 10/17/2020 for I&D of the left groin. Last dopplers in Aug 2022 showed normal ABIs.   On follow up today he is doing well. He does get SOB when doing yard work. Usually uses albuterol prior to physical activity. Denies any  chest pain or claudication. Needs refill on SBE prophylaxis. He is still smoking 1/2 pk/day. Working part time as Biomedical scientist at [employer]. Walks about 4 miles on days he works. Goes to gym 3/week and uses elliptical    Past Medical History:  Diagnosis Date   Alcohol abuse    Aortic stenosis    Arthritis    Asthma    exercise indused or pollen-uses inhaler dail yand has rescue inhaler if needed   Bipolar disorder ([place])    CAD (coronary artery disease)    coronary calcifications 08/2013 CTA   Carotid artery occlusion    Cataract    bilateral sx   Chronic back pain    Claudication ([place])    Complication of anesthesia 10/16/2020   "had a panic attack when the mask was put on me"" I do not think I was given anything  before, I need something to calm me down"   Depression    Family history of skin cancer    Heart murmur    as an infant   Hepatitis  h/o 1970,doesn't remember which type,1990 epstain-barr   Hyperlipidemia    Hypertension    Neuromuscular disorder ([place])    Obsessive compulsive disorder    PAD (peripheral artery disease) ([facility])    Peripheral neuropathy    Pneumonia    RBBB    RBBB (right bundle branch block with left anterior fascicular block)    Seasonal allergies    Severe aortic stenosis    Sleep apnea    does not wear CPAP   Smoker    Spinal stenosis at L4-L5 level    Tobacco abuse    Tobacco use disorder     Past Surgical History:  Procedure Laterality Date   ANAL FISTULECTOMY  09/25/2011   AORTA - BILATERAL FEMORAL ARTERY BYPASS GRAFT N/A 10/03/2020   Procedure: AORTA BIFEMORAL BYPASS GRAFT USING A HEMASHIELD GOLD BIFURCATED 14 X 30m GRAFT AND INFERIOR MESENTERIC ARTERY REIMPLANTATION;   Surgeon: BSerafina Mitchell MD;  [facility];  Service: Vascular;  Laterality: N/A;   AORTIC VALVE REPLACEMENT N/A 11/24/2019   Procedure: AORTIC VALVE REPLACEMENT (AVR) using INSPIRIS Resilia 23 MM Bioprosthetic Aortic Valve.;  Surgeon: BGaye Pollack MD;  [facility]: [facility];  Service: Open Heart Surgery;  Laterality: N/A;   APPLICATION OF WOUND VAC Left 10/17/2020   Procedure: APPLICATION OF WOUND VAC;  Surgeon: BSerafina Mitchell MD;  [facility];  Service: Vascular;  Laterality: Left;   BACK SURGERY     2015   CATARACT EXTRACTION, BILATERAL  2022   COLONOSCOPY  2009   MS-F/V-mov(exc)-HPP   CORONARY ARTERY BYPASS GRAFT N/A 11/24/2019   Procedure: CORONARY ARTERY BYPASS GRAFTING (CABG) using LIMA to LAD.;  Surgeon: BGaye Pollack MD;  [facility];  Service: Open Heart Surgery;  Laterality: N/A;   ELBOW SURGERY     bilaterally for cubital tunnel   ENDARTERECTOMY N/A 10/03/2020   Procedure: AORTIC ENDARTERECTOMY;  Surgeon: BSerafina Mitchell MD;  [facility]  Service: Vascular;  Laterality: N/A;   ENDARTERECTOMY FEMORAL Bilateral 10/03/2020   Procedure: BILATERAL FEMORAL ENDARTERECTOMY;  Surgeon: BSerafina Mitchell MD;  [facility]  Service: Vascular;  Laterality: Bilateral;   HERNIA REPAIR     with mesh   INCISION AND DRAINAGE Left 01/02/2021   Procedure: INCISION AND DRAINAGE LEFT GROIN WITH STIMULAN BEADS;  Surgeon: BSerafina Mitchell MD;  [facility]  Service: Vascular;  Laterality: Left;   INCISION AND DRAINAGE OF WOUND Left 10/17/2020   Procedure: EXPLORATION LEFT GROIN WOUND;  Surgeon: BSerafina Mitchell MD;  [facility];  Service: Vascular;  Laterality: Left;   MAXIMUM ACCESS (MAS)POSTERIOR LUMBAR INTERBODY FUSION (PLIF) 1 LEVEL N/A 10/25/2014   Procedure: LUMBAR FOUR TO FIVE MAXIMUM ACCESS (MAS) POSTERIOR LUMBAR INTERBODY FUSION (PLIF) 1 LEVEL;  Surgeon: DEustace Moore MD;  [facility];  Service: Neurosurgery;  Laterality: N/A;   RIGHT/LEFT  HEART CATH AND CORONARY ANGIOGRAPHY N/A 11/10/2019   Procedure: RIGHT/LEFT HEART CATH AND CORONARY ANGIOGRAPHY;  Surgeon: JMartinique Louan Base M, MD;  [facility];  Service: Cardiovascular;  Laterality: N/A;   SKIN BIOPSY Left 10/12/2018   shave forehead Hypertrophic actinic kertosis with features of a verruca   TEE WITHOUT CARDIOVERSION N/A 11/24/2019   Procedure: TRANSESOPHAGEAL ECHOCARDIOGRAM (TEE);  Surgeon: BGaye Pollack MD;  [facility]  Service: Open Heart Surgery;  Laterality: N/A;   TONSILLECTOMY  age 71  VASECTOMY     X 2   VASECTOMY REVERSAL  Current Outpatient Medications  Medication Sig Dispense Refill   acetaminophen (TYLENOL) 325 MG tablet Take 2 tablets (650 mg total) by mouth every 6 (six) hours as needed for mild pain.     albuterol (VENTOLIN HFA) 108 (90 Base) MCG/ACT inhaler INHALE TWO PUFFS BY MOUTH EVERY 6 HOURS AS NEEDED FOR WHEEZING 8.5 g 1   amLODipine (NORVASC) 10 MG tablet Take 1 tablet (10 mg total) by mouth daily. 90 tablet 3   aspirin EC 81 MG tablet Take 1 tablet (81 mg total) by mouth daily. Swallow whole. 90 tablet 3   atorvastatin (LIPITOR) 80 MG tablet Take 1 tablet (80 mg total) by mouth daily. 90 tablet 3   azelastine (ASTELIN) 0.1 % nasal spray Place 1-2 sprays into both nostrils 2 (two) times daily as needed (nasal drainage). Use in each nostril as directed 30 mL 5   divalproex (DEPAKOTE ER) 500 MG 24 hr tablet Take 500 mg by mouth 3 (three) times daily. 2- 3 tablets once daily     finasteride (PROSCAR) 5 MG tablet Take 1 tablet (5 mg total) by mouth daily. 90 tablet 3   Fluticasone-Umeclidin-Vilant (TRELEGY ELLIPTA) 200-62.5-25 MCG/ACT AEPB Inhale 1 puff into the lungs daily. Rinse mouth after each use. 60 each 3   guaiFENesin 200 MG tablet Take 200 mg by mouth every 4 (four) hours as needed for cough or to loosen phlegm.     lamoTRIgine (LAMICTAL) 200 MG tablet Take 1 tablet (200 mg total) by mouth every morning. 90 tablet 3    lisinopril-hydrochlorothiazide (ZESTORETIC) 20-12.5 MG tablet Take 1 tablet by mouth every morning. 90 tablet 2   loratadine (CLARITIN) 10 MG tablet Take 1 tablet by mouth daily.     Multiple Vitamins-Minerals (MULTIVITAMIN ADULTS 50+ PO) Take 1 tablet by mouth daily.      nicotine (NICODERM CQ - DOSED IN MG/24 HOURS) 21 mg/24hr patch Place 21 mg onto the skin daily.     predniSONE (DELTASONE) 10 MG tablet Take by mouth.     amoxicillin (AMOXIL) 500 MG capsule Take 4 capsules (2,000 mg total) by mouth once as needed for up to 1 dose. Take 4 tablets one hour before dental work 4 capsule 11   No current facility-administered medications for this visit.    Allergies:   Codeine    Social History:  The patient  reports that he has been smoking cigarettes. He has a 12.50 pack-year smoking history. He has never used smokeless tobacco. He reports that he does not currently use alcohol. He reports that he does not use drugs.   Family History:  The patient's family history includes Cancer in his father, maternal aunt, and maternal grandmother; Colon polyps (age of onset: 51) in his mother; Diabetes in his brother; Drug abuse in his brother and father; Heart disease in his brother; Heart failure in his mother; Hypertension in his brother; Stroke (age of onset: 82) in his father.    ROS:  Please see the history of present illness.   Otherwise, review of systems are positive for none.   All other systems are reviewed and negative.    PHYSICAL EXAM: VS:  BP 138/62   Pulse (!) 48   Ht 5' 5.5" (1.664 m)   Wt 142 lb 3.2 oz (64.5 kg)   SpO2 98%   BMI 23.30 kg/m  , BMI Body mass index is 23.3 kg/m. GEN: Well nourished, well developed, in no acute distress  HEENT: normal  Neck: no JVD, carotid bruits,  or masses Cardiac: RRR; soft 2-7/5 systolic  murmur.  No  gallops,no edema.  Respiratory:  bilateral wheezes GI: soft, nontender, nondistended, + BS MS: no deformity or atrophy. Femoral/pedal pulses are  2+ Skin: warm and dry, no rash Neuro:  Strength and sensation are intact Psych: euthymic mood, full affect   EKG:  EKG is ordered today. The ekg ordered today demonstrates NSR rate 48. RBBB. I have personally reviewed and interpreted this study.    Recent Labs: 04/15/2022: ALT 25; BUN 16; Creatinine, Ser 0.96; Hemoglobin 13.9; Platelets 170; Potassium 4.5; Sodium 140    Lipid Panel    Component Value Date/Time   CHOL 91 (L) 04/15/2022 1439   TRIG 126 04/15/2022 1439   HDL 35 (L) 04/15/2022 1439   CHOLHDL 2.6 04/15/2022 1439   CHOLHDL 5.1 (H) 06/29/2017 1534   VLDL 45 (H) 06/29/2017 1534   LDLCALC 33 04/15/2022 1439      Wt Readings from Last 3 Encounters:  06/02/22 142 lb 3.2 oz (64.5 kg)  05/22/22 143 lb 6.4 oz (65 kg)  04/21/22 140 lb 8 oz (63.7 kg)      Other studies Reviewed: Additional studies/ records that were reviewed today include:  Echo 10/06/19: IMPRESSIONS      1. Left ventricular ejection fraction, by visual estimation, is 55 to 60%. The left ventricle has normal function. Normal left ventricular size. Left ventricular septal wall thickness was moderately increased. There is moderately increased left  ventricular hypertrophy.  2. Global right ventricle has normal systolic function.The right ventricular size is normal. No increase in right ventricular wall thickness.  3. Left atrial size was normal.  4. Right atrial size was normal.  5. Mild mitral annular calcification.  6. Moderate calcification of the mitral valve leaflet(s).  7. Moderate thickening of the mitral valve leaflet(s).  8. The mitral valve is normal in structure. Trace mitral valve regurgitation.  9. The tricuspid valve is normal in structure. Tricuspid valve regurgitation is mild. 10. The aortic valve has an indeterminant number of cusps Aortic valve regurgitation is mild by color flow Doppler. Severe aortic valve stenosis. 11. There is Severe calcifcation of the aortic valve. 12. There  is Severely thickening of the aortic valve. 13. The pulmonic valve was grossly normal. Pulmonic valve regurgitation is mild by color flow Doppler  Cardiac cath 11/10/19:  RIGHT/LEFT HEART CATH AND CORONARY ANGIOGRAPHY  Conclusion    Prox LAD to Mid LAD lesion is 99% stenosed. Mid Cx to Dist Cx lesion is 50% stenosed. Mid RCA lesion is 30% stenosed. The left ventricular systolic function is normal. LV end diastolic pressure is normal. The left ventricular ejection fraction is 55-65% by visual estimate. There is severe aortic valve stenosis.   1. Critical single vessel obstructive CAD with 99% mid LAD - heavily calcified 2. Severe aortic stenosis. Mean gradient 47 mm Hg. AVA 0.68 cm squared with index 0.39. 3. Normal LV filling pressures 4. Normal Right heart pressures 5. Normal cardiac output.   Plan: referral to CT surgery for combined    Echo 12/13/19: IMPRESSIONS     1. Left ventricular ejection fraction, by visual estimation, is 70 to  75%. The left ventricle has normal function. There is no left ventricular  hypertrophy.   2. The left ventricle has no regional wall motion abnormalities.   3. Global right ventricle has normal systolic function.The right  ventricular size is normal. No increase in right ventricular wall  thickness.   4. Left atrial size was  normal.   5. Right atrial size was normal.   6. The mitral valve is normal in structure. No evidence of mitral valve  regurgitation. No evidence of mitral stenosis.   7. The tricuspid valve is normal in structure. Tricuspid valve  regurgitation is trivial.   8. Aortic valve regurgitation is not visualized.   9. Aortic Valve Replacement-60m Biosprosthetic Peak velocity 2.524m,  mean 1220m.  10. The pulmonic valve was normal in structure. Pulmonic valve  regurgitation is not visualized.  11. Mildly elevated pulmonary artery systolic pressure.  12. The inferior vena cava is normal in size with greater than 50%   respiratory variability, suggesting right atrial pressure of 3 mmHg.   ASSESSMENT AND PLAN:  1. Coronary disease. With severe LAD stenosis. S/p CABG with LIMA to the LAD in 2020.    Recommend continued aspirin, amlodipine, and statin therapy.    2. Hypertension-control is good   3. Severe  aortic stenosis.   S/p pericardial  tissue AVR in 2020. Repeat Echo in 2020 showed normal AV prosthetic function.  Instructed on SBE prophylaxis.  4. PAD with chronic claudication. Dopplers revealed severe aortoiliac disease. Now s/p Aortobifemoral bypass. Last dopplers in August 2022.   5. Tobacco abuse. Have strongly encouraged smoking cessation. Encourage him to follow thru with smoking cessation program   6. Hyperlipidemia. High risk patient. LDL goal < 70. Now on high dose lipitor. Excellent control with LDL 33.    7. Right bundle branch block with -chronic   8. Bipolar disorder.  9. Remote history of Etoh abuse- sober for years.   10. Complete heart block post op heart surgery. Resolved. Avoid AV nodal blocking agents.    Current medicines are reviewed at length with the patient today.  The patient does not have concerns regarding medicines.  The following changes have been made:  See above  Labs/ tests ordered today include:   Orders Placed This Encounter  Procedures   EKG 12-Lead     Disposition:   FU 12 months   Signed, Keerthana Vanrossum JorMartiniqueD  06/02/2022 8:29 AM    [facility] [id number] [phone]

## 2022-06-02 NOTE — Patient Instructions (Signed)
Medication Instructions:  DECREASE aspirin to '81mg'$  daily CONTINUE all other current medications   *If you need a refill on your cardiac medications before your next appointment, please call your pharmacy*   Follow-Up: At [facility], you and your health needs are our priority.  As part of our continuing mission to provide you with exceptional heart care, we have created designated Provider Care Teams.  These Care Teams include your primary Cardiologist (physician) and Advanced Practice Providers (APPs -  Physician Assistants and Nurse Practitioners) who all work together to provide you with the care you need, when you need it.  We recommend signing up for the patient portal called "MyChart".  Sign up information is provided on this After Visit Summary.  MyChart is used to connect with patients for Virtual Visits (Telemedicine).  Patients are able to view lab/test results, encounter notes, upcoming appointments, etc.  Non-urgent messages can be sent to your provider as well.   To learn more about what you can do with MyChart, go to [email or web address].    Your next appointment:   12 month(s)  The format for your next appointment:   In Person  Provider:   Peter [name], MD {

## 2022-06-17 ENCOUNTER — Ambulatory Visit
Admission: RE | Admit: 2022-06-17 | Discharge: 2022-06-17 | Disposition: A | Discharge status: Home / Self Care | Payer: PPO | Source: Ambulatory Visit | Attending: Family Medicine | Admitting: Family Medicine

## 2022-06-17 ENCOUNTER — Ambulatory Visit (INDEPENDENT_AMBULATORY_CARE_PROVIDER_SITE_OTHER): Payer: PPO | Admitting: Family Medicine

## 2022-06-17 ENCOUNTER — Encounter: Payer: Self-pay | Admitting: Family Medicine

## 2022-06-17 VITALS — BP 110/60 | HR 70 | Temp 97.7°F | Wt 138.6 lb

## 2022-06-17 DIAGNOSIS — M546 Pain in thoracic spine: Secondary | ICD-10-CM | POA: Diagnosis not present

## 2022-06-17 DIAGNOSIS — G8929 Other chronic pain: Secondary | ICD-10-CM | POA: Diagnosis not present

## 2022-06-17 DIAGNOSIS — Z981 Arthrodesis status: Secondary | ICD-10-CM | POA: Diagnosis not present

## 2022-06-17 DIAGNOSIS — J4530 Mild persistent asthma, uncomplicated: Secondary | ICD-10-CM

## 2022-07-01 DIAGNOSIS — Z7951 Long term (current) use of inhaled steroids: Secondary | ICD-10-CM | POA: Diagnosis not present

## 2022-07-01 DIAGNOSIS — J449 Chronic obstructive pulmonary disease, unspecified: Secondary | ICD-10-CM | POA: Diagnosis not present

## 2022-07-01 DIAGNOSIS — F17200 Nicotine dependence, unspecified, uncomplicated: Secondary | ICD-10-CM | POA: Diagnosis not present

## 2022-07-01 DIAGNOSIS — I10 Essential (primary) hypertension: Secondary | ICD-10-CM | POA: Diagnosis not present

## 2022-07-04 ENCOUNTER — Ambulatory Visit (INDEPENDENT_AMBULATORY_CARE_PROVIDER_SITE_OTHER): Payer: PPO | Admitting: Family Medicine

## 2022-07-04 VITALS — BP 128/60 | HR 57 | Temp 96.8°F | Wt 141.4 lb

## 2022-07-04 DIAGNOSIS — L570 Actinic keratosis: Secondary | ICD-10-CM | POA: Diagnosis not present

## 2022-07-04 DIAGNOSIS — L821 Other seborrheic keratosis: Secondary | ICD-10-CM | POA: Diagnosis not present

## 2022-07-04 MED ORDER — FLUOROURACIL 5 % EX CREA: TOPICAL_CREAM | Freq: Two times a day (BID) | CUTANEOUS | 0 refills | Status: DC

## 2022-07-04 NOTE — Progress Notes (Signed)
   Subjective:    Patient ID: Gregory [name], male    DOB: 1951-06-03, 71 y.o.   MRN: [id number]  HPI He is here for evaluation of lesions that he noted on his scalp that are starting to get scaly and slightly uncomfortable.  He also has a lesion on his forehead that he would like evaluated.   Review of Systems     Objective:   Physical Exam Exam of the scalp does show hypopigmentation as well as some slight drying and scaling.  He also has a slightly raised pigmented well-demarcated lesion of approximately 1 cm on his forehead.       Assessment & Plan:  Actinic keratosis - Plan: fluorouracil (EFUDEX) 5 % cream  Seborrheic keratosis Described the use of Efudex for him explaining that if it gets too red and raw he should back off on it and probably use it for roughly 2 weeks.  Explained that the seborrheic keratosis is not dangerous but on his forehead so if he wants that further evaluated and treated we can have dermatology see him.

## 2022-07-08 NOTE — Progress Notes (Unsigned)
Follow Up Note  RE: Gregory [name] MRN: [id number] DOB: 04/17/1951 Date of Office Visit: 07/09/2022  Referring provider: [name], MD Primary care provider: [name], MD  Chief Complaint: No chief complaint on file.  History of Present Illness: I had the pleasure of seeing Gregory [name] for a follow up visit at the [facility] on 07/08/2022. He is a 71 y.o. male, who is being followed for chronic rhinitis, asthma/COPD, recurrent infections. His previous allergy office visit was on 04/21/2022 with Dr. Maudie Mercury. Today is a regular follow up visit.  Chronic rhinitis Perennial rhinoconjunctivitis symptoms for many years.  Tried Flonase, antihistamines with minimal benefit.  Was on AIT from age [age] with some benefit.  No recent allergy evaluation.  Saw ENT in 2023 but not a surgical candidate.  Patient had sinus surgery in the 1960s.  2023 CT sinus showed polypoid mucosal thickening, septum deviation. Xhance not covered. Smoking since age 82. Unable to skin test today as patient had to go back to work and will get bloodwork as need to get bloodwork for other reasons as well.  Will make additional recommendations based on results. Use over the counter antihistamines such as Zyrtec (cetirizine), Claritin (loratadine), Allegra (fexofenadine), or Xyzal (levocetirizine) daily as needed. May take twice a day during allergy flares. May switch antihistamines every few months. Use Flonase (fluticasone) nasal spray 2 sprays per nostril twice a day as needed for nasal congestion.  Use azelastine nasal spray 1-2 sprays per nostril twice a day as needed for runny nose/drainage. Nasal saline spray (i.e., Simply Saline) or nasal saline lavage (i.e., NeilMed) is recommended as needed and prior to medicated nasal sprays. Consider Dupixent for the polypoid nasal changes.    Asthma-COPD overlap syndrome ([place]) Diagnosed with asthma at age 26.  Currently on Advair 500 mcg 1 puff  twice a day.  Uses albuterol prior to exertion.  Smoked since age 15. Today's spirometry showed severe restriction with no improvement in FEV1 postbronchodilator treatment.  Clinically feeling improved. Most likely has component of COPD as well.  Daily controller medication(s): start Trelegy 226mg 1 puff once a day and rinse mouth after each use. This replaces Advair for now.  Prior to physical activity: May use albuterol rescue inhaler 2 puffs 5 to 15 minutes prior to strenuous physical activities. Rescue medications: May use albuterol rescue inhaler 2 puffs or nebulizer every 4 to 6 hours as needed for shortness of breath, chest tightness, coughing, and wheezing. Monitor frequency of use.  Discussed smoking cessation. Get spirometry at next visit.   Recurrent infections Multiple "sinus infections". No prior immune work up. Keep track of infections/antibiotics use. Get bloodwork to look at immune system.   Return in about 2 months (around 06/21/2022).  Assessment and Plan: JJamorrisis a 71y.o. male with: No problem-specific Assessment & Plan notes found for this encounter.  No follow-ups on file.  No orders of the defined types were placed in this encounter.  Lab Orders  No laboratory test(s) ordered today    Diagnostics: Spirometry:  Tracings reviewed. His effort: {Blank single:19197::"Good reproducible efforts.","It was hard to get consistent efforts and there is a question as to whether this reflects a maximal maneuver.","Poor effort, data can not be interpreted."} FVC: ***L FEV1: ***L, ***% predicted FEV1/FVC ratio: ***% Interpretation: {Blank single:19197::"Spirometry consistent with mild obstructive disease","Spirometry consistent with moderate obstructive disease","Spirometry consistent with severe obstructive disease","Spirometry consistent with possible restrictive disease","Spirometry consistent with mixed obstructive and restrictive  disease","Spirometry uninterpretable  due to technique","Spirometry consistent with normal pattern","No overt abnormalities noted given today's efforts"}.  Please see scanned spirometry results for details.  Skin Testing: {Blank single:19197::"Select foods","Environmental allergy panel","Environmental allergy panel and select foods","Food allergy panel","None","Deferred due to recent antihistamines use"}. *** Results discussed with patient/family.   Medication List:  Current Outpatient Medications  Medication Sig Dispense Refill   acetaminophen (TYLENOL) 325 MG tablet Take 2 tablets (650 mg total) by mouth every 6 (six) hours as needed for mild pain.     albuterol (VENTOLIN HFA) 108 (90 Base) MCG/ACT inhaler INHALE TWO PUFFS BY MOUTH EVERY 6 HOURS AS NEEDED FOR WHEEZING 8.5 g 1   amLODipine (NORVASC) 10 MG tablet Take 1 tablet (10 mg total) by mouth daily. 90 tablet 3   amoxicillin (AMOXIL) 500 MG capsule Take 4 capsules (2,000 mg total) by mouth once as needed for up to 1 dose. Take 4 tablets one hour before dental work (Patient not taking: Reported on 06/17/2022) 4 capsule 11   aspirin EC 81 MG tablet Take 1 tablet (81 mg total) by mouth daily. Swallow whole. 90 tablet 3   atorvastatin (LIPITOR) 80 MG tablet Take 1 tablet (80 mg total) by mouth daily. 90 tablet 3   azelastine (ASTELIN) 0.1 % nasal spray Place 1-2 sprays into both nostrils 2 (two) times daily as needed (nasal drainage). Use in each nostril as directed 30 mL 5   divalproex (DEPAKOTE ER) 500 MG 24 hr tablet Take 500 mg by mouth 3 (three) times daily. 2- 3 tablets once daily     finasteride (PROSCAR) 5 MG tablet Take 1 tablet (5 mg total) by mouth daily. 90 tablet 3   fluorouracil (EFUDEX) 5 % cream Apply topically 2 (two) times daily. 40 g 0   Fluticasone-Umeclidin-Vilant (TRELEGY ELLIPTA) 200-62.5-25 MCG/ACT AEPB Inhale 1 puff into the lungs daily. Rinse mouth after each use. 60 each 3   guaiFENesin 200 MG tablet Take 200 mg by mouth every 4 (four) hours as needed  for cough or to loosen phlegm.     lamoTRIgine (LAMICTAL) 200 MG tablet Take 1 tablet (200 mg total) by mouth every morning. 90 tablet 3   lisinopril-hydrochlorothiazide (ZESTORETIC) 20-12.5 MG tablet Take 1 tablet by mouth every morning. 90 tablet 2   loratadine (CLARITIN) 10 MG tablet Take 1 tablet by mouth daily.     Multiple Vitamins-Minerals (MULTIVITAMIN ADULTS 50+ PO) Take 1 tablet by mouth daily.      nicotine (NICODERM CQ - DOSED IN MG/24 HOURS) 21 mg/24hr patch Place 21 mg onto the skin daily.     No current facility-administered medications for this visit.   Allergies: Allergies  Allergen Reactions   Codeine Anaphylaxis   I reviewed his past medical history, social history, family history, and environmental history and no significant changes have been reported from his previous visit.  Review of Systems  Constitutional:  Negative for appetite change, chills, fever and unexpected weight change.  HENT:  Negative for congestion and rhinorrhea.   Eyes:  Negative for itching.  Respiratory:  Positive for wheezing. Negative for cough, chest tightness and shortness of breath.   Cardiovascular:  Negative for chest pain.  Gastrointestinal:  Negative for abdominal pain.  Genitourinary:  Negative for difficulty urinating.  Skin:  Negative for rash.  Neurological:  Negative for headaches.    Objective: There were no vitals taken for this visit. There is no height or weight on file to calculate BMI. Physical Exam Vitals and nursing note  reviewed.  Constitutional:      Appearance: Normal appearance. He is well-developed.  HENT:     Head: Normocephalic and atraumatic.     Right Ear: Tympanic membrane and external ear normal.     Left Ear: Tympanic membrane and external ear normal.     Nose: Nose normal.     Mouth/Throat:     Mouth: Mucous membranes are moist.     Pharynx: Oropharynx is clear.  Eyes:     Conjunctiva/sclera: Conjunctivae normal.  Cardiovascular:     Rate and  Rhythm: Normal rate and regular rhythm.     Heart sounds: Murmur heard.     No friction rub. No gallop.  Pulmonary:     Effort: Pulmonary effort is normal.     Breath sounds: Wheezing present. No rhonchi or rales.     Comments: Wheezing resolved after bronchodilator treatment. Musculoskeletal:     Cervical back: Neck supple.  Skin:    General: Skin is warm.     Findings: No rash.  Neurological:     Mental Status: He is alert and oriented to person, place, and time.    Previous notes and tests were reviewed. The plan was reviewed with the patient/family, and all questions/concerned were addressed.  It was my pleasure to see Gregory [name] today and participate in his care. Please feel free to contact me with any questions or concerns.  Sincerely,  Rexene Alberts, DO Allergy & Immunology  [facility] [place] office: [place] office: [phone]

## 2022-07-09 ENCOUNTER — Encounter: Payer: Self-pay | Admitting: Allergy

## 2022-07-09 ENCOUNTER — Ambulatory Visit: Payer: PPO | Admitting: Allergy

## 2022-07-09 VITALS — BP 130/82 | HR 68 | Temp 97.8°F | Resp 16

## 2022-07-09 DIAGNOSIS — J4489 Other specified chronic obstructive pulmonary disease: Secondary | ICD-10-CM

## 2022-07-09 DIAGNOSIS — J3089 Other allergic rhinitis: Secondary | ICD-10-CM | POA: Insufficient documentation

## 2022-07-09 DIAGNOSIS — B999 Unspecified infectious disease: Secondary | ICD-10-CM

## 2022-07-09 DIAGNOSIS — J449 Chronic obstructive pulmonary disease, unspecified: Secondary | ICD-10-CM | POA: Diagnosis not present

## 2022-07-09 DIAGNOSIS — J310 Chronic rhinitis: Secondary | ICD-10-CM

## 2022-07-09 NOTE — Assessment & Plan Note (Signed)
Past history - Perennial rhinoconjunctivitis symptoms for many years.  Tried Flonase, antihistamines with minimal benefit.  Was on AIT from age [age] with some benefit.  Saw ENT in 2023 but not a surgical candidate.  Patient had sinus surgery in the 1960s.  2023 CT sinus showed polypoid mucosal thickening, septum deviation. Xhance not covered. Smoking since age 74. Interim history - 2023 bloodwork was positive to dust mites.  Borderline to cat, cockroach and mold.  Use over the counter antihistamines such as Zyrtec (cetirizine), Claritin (loratadine), Allegra (fexofenadine), or Xyzal (levocetirizine) daily as needed. May take twice a day during allergy flares. May switch antihistamines every few months.  Use Flonase (fluticasone) nasal spray 2 sprays per nostril twice a day as needed for nasal congestion.   Use azelastine nasal spray 1-2 sprays per nostril twice a day as needed for runny nose/drainage.  Nasal saline spray (i.e., Simply Saline) or nasal saline lavage (i.e., NeilMed) is recommended as needed and prior to medicated nasal sprays.  Read about Dupixent - used for asthma and nasal polyps, handout given.

## 2022-07-09 NOTE — Assessment & Plan Note (Signed)
Past history - Multiple "sinus infections".  Interim history - normal immunoglobulin levels.  Protective titers against diphtheria, tetanus and pneumococcal. . Keep track of infections and antibiotics use.

## 2022-07-09 NOTE — Assessment & Plan Note (Addendum)
Past history - Diagnosed with asthma at age 71.  Currently on Advair 500 mcg 1 puff twice a day.  Uses albuterol prior to exertion.  Smoked since age 41. 2023 spirometry showed severe restriction with no improvement in FEV1 postbronchodilator treatment.  Clinically feeling improved. Normal alpha-1 antitrypsin level.  Interim history - doing much better with Trelegy but still using albuterol prior to yardwork and any type of exertion. He is not sure if he needs it or not.   Today's spirometry showed mixed restriction and obstruction. . Daily controller medication(s): continue Trelegy 232mg 1 puff once a day and rinse mouth after each use. . Try only using the albuterol as needed and see if notice a difference. . May use albuterol rescue inhaler 2 puffs or nebulizer every 4 to 6 hours as needed for shortness of breath, chest tightness, coughing, and wheezing. May use albuterol rescue inhaler 2 puffs 5 to 15 minutes prior to strenuous physical activities. Monitor frequency of use.   Discussed smoking cessation - smoking 1/2 ppd.  Get spirometry at next visit.

## 2022-07-09 NOTE — Patient Instructions (Addendum)
Rhinitis:  2023 bloodwork was positive to dust mites.  Borderline to cat, cockroach and mold. Use over the counter antihistamines such as Zyrtec (cetirizine), Claritin (loratadine), Allegra (fexofenadine), or Xyzal (levocetirizine) daily as needed. May take twice a day during allergy flares. May switch antihistamines every few months. Use Flonase (fluticasone) nasal spray 2 sprays per nostril twice a day as needed for nasal congestion.  Use azelastine nasal spray 1-2 sprays per nostril twice a day as needed for runny nose/drainage. Nasal saline spray (i.e., Simply Saline) or nasal saline lavage (i.e., NeilMed) is recommended as needed and prior to medicated nasal sprays. Read about Dupixent - used for asthma and nasal polyps.   Asthma: Daily controller medication(s): continue Trelegy 272mg 1 puff once a day and rinse mouth after each use. Try only using the albuterol only as needed and see if you notice a difference.  May use albuterol rescue inhaler 2 puffs or nebulizer every 4 to 6 hours as needed for shortness of breath, chest tightness, coughing, and wheezing. May use albuterol rescue inhaler 2 puffs 5 to 15 minutes prior to strenuous physical activities. Monitor frequency of use.  Asthma control goals:  Full participation in all desired activities (may need albuterol before activity) Albuterol use two times or less a week on average (not counting use with activity) Cough interfering with sleep two times or less a month Oral steroids no more than once a year No hospitalizations   Recommend decreasing smoking.   Infections Keep track of infections and antibiotics use.   Follow up in 4 months or sooner if needed.    Control of House Dust Mite Allergen Dust mite allergens are a common trigger of allergy and asthma symptoms. While they can be found throughout the house, these microscopic creatures thrive in warm, humid environments such as bedding, upholstered furniture and  carpeting. Because so much time is spent in the bedroom, it is essential to reduce mite levels there.  Encase pillows, mattresses, and box springs in special allergen-proof fabric covers or airtight, zippered plastic covers.  Bedding should be washed weekly in hot water (130 F) and dried in a hot dryer. Allergen-proof covers are available for comforters and pillows that can't be regularly washed.  Wash the allergy-proof covers every few months. Minimize clutter in the bedroom. Keep pets out of the bedroom.  Keep humidity less than 50% by using a dehumidifier or air conditioning. You can buy a humidity measuring device called a hygrometer to monitor this.  If possible, replace carpets with hardwood, linoleum, or washable area rugs. If that's not possible, vacuum frequently with a vacuum that has a HEPA filter. Remove all upholstered furniture and non-washable window drapes from the bedroom. Remove all non-washable stuffed toys from the bedroom.  Wash stuffed toys weekly.  Pet Allergen Avoidance: Contrary to popular opinion, there are no "hypoallergenic" breeds of dogs or cats. That is because people are not allergic to an animal's hair, but to an allergen found in the animal's saliva, dander (dead skin flakes) or urine. Pet allergy symptoms typically occur within minutes. For some people, symptoms can build up and become most severe 8 to 12 hours after contact with the animal. People with severe allergies can experience reactions in public places if dander has been transported on the pet owners' clothing. Keeping an animal outdoors is only a partial solution, since homes with pets in the yard still have higher concentrations of animal allergens. Before getting a pet, ask your allergist to determine if  you are allergic to animals. If your pet is already considered part of your family, try to minimize contact and keep the pet out of the bedroom and other rooms where you spend a great deal of time. As  with dust mites, vacuum carpets often or replace carpet with a hardwood floor, tile or linoleum. High-efficiency particulate air (HEPA) cleaners can reduce allergen levels over time. While dander and saliva are the source of cat and dog allergens, urine is the source of allergens from rabbits, hamsters, mice and [place] pigs; so ask a non-allergic family member to clean the animal's cage. If you have a pet allergy, talk to your allergist about the potential for allergy immunotherapy (allergy shots). This strategy can often provide long-term relief.  Cockroach Allergen Avoidance Cockroaches are often found in the homes of densely populated urban areas, schools or commercial buildings, but these creatures can lurk almost anywhere. This does not mean that you have a dirty house or living area. Block all areas where roaches can enter the home. This includes crevices, wall cracks and windows.  Cockroaches need water to survive, so fix and seal all leaky faucets and pipes. Have an exterminator go through the house when your family and pets are gone to eliminate any remaining roaches. Keep food in lidded containers and put pet food dishes away after your pets are done eating. Vacuum and sweep the floor after meals, and take out garbage and recyclables. Use lidded garbage containers in the kitchen. Wash dishes immediately after use and clean under stoves, refrigerators or toasters where crumbs can accumulate. Wipe off the stove and other kitchen surfaces and cupboards regularly.  Mold Control Mold and fungi can grow on a variety of surfaces provided certain temperature and moisture conditions exist.  Outdoor molds grow on plants, decaying vegetation and soil. The major outdoor mold, Alternaria and Cladosporium, are found in very high numbers during hot and dry conditions. Generally, a late summer - fall peak is seen for common outdoor fungal spores. Rain will temporarily lower outdoor mold spore count, but  counts rise rapidly when the rainy period ends. The most important indoor molds are Aspergillus and Penicillium. Dark, humid and poorly ventilated basements are ideal sites for mold growth. The next most common sites of mold growth are the bathroom and the kitchen. Outdoor (Seasonal) Mold Control Use air conditioning and keep windows closed. Avoid exposure to decaying vegetation. Avoid leaf raking. Avoid grain handling. Consider wearing a face mask if working in moldy areas.  Indoor (Perennial) Mold Control  Maintain humidity below 50%. Get rid of mold growth on hard surfaces with water, detergent and, if necessary, 5% bleach (do not mix with other cleaners). Then dry the area completely. If mold covers an area more than 10 square feet, consider hiring an indoor environmental professional. For clothing, washing with soap and water is best. If moldy items cannot be cleaned and dried, throw them away. Remove sources e.g. contaminated carpets. Repair and seal leaking roofs or pipes. Using dehumidifiers in damp basements may be helpful, but empty the water and clean units regularly to prevent mildew from forming. All rooms, especially basements, bathrooms and kitchens, require ventilation and cleaning to deter mold and mildew growth. Avoid carpeting on concrete or damp floors, and storing items in damp areas.

## 2022-07-14 ENCOUNTER — Other Ambulatory Visit (HOSPITAL_BASED_OUTPATIENT_CLINIC_OR_DEPARTMENT_OTHER): Payer: Self-pay

## 2022-07-14 DIAGNOSIS — G4710 Hypersomnia, unspecified: Secondary | ICD-10-CM

## 2022-07-14 DIAGNOSIS — H35342 Macular cyst, hole, or pseudohole, left eye: Secondary | ICD-10-CM | POA: Diagnosis not present

## 2022-07-14 DIAGNOSIS — H26493 Other secondary cataract, bilateral: Secondary | ICD-10-CM | POA: Diagnosis not present

## 2022-07-14 DIAGNOSIS — H35371 Puckering of macula, right eye: Secondary | ICD-10-CM | POA: Diagnosis not present

## 2022-07-14 DIAGNOSIS — H35372 Puckering of macula, left eye: Secondary | ICD-10-CM | POA: Diagnosis not present

## 2022-07-14 DIAGNOSIS — R0683 Snoring: Secondary | ICD-10-CM

## 2022-07-14 DIAGNOSIS — H524 Presbyopia: Secondary | ICD-10-CM | POA: Diagnosis not present

## 2022-07-14 DIAGNOSIS — D3132 Benign neoplasm of left choroid: Secondary | ICD-10-CM | POA: Diagnosis not present

## 2022-07-31 ENCOUNTER — Telehealth: Payer: Self-pay | Admitting: Family Medicine

## 2022-07-31 DIAGNOSIS — J329 Chronic sinusitis, unspecified: Secondary | ICD-10-CM | POA: Diagnosis not present

## 2022-07-31 NOTE — Telephone Encounter (Signed)
Received a call from a NP with [facility]. Her name was Jobart and [facility] is affiliated with his insurance company. She states she received a call from pt complaining of viral allergy sinus issues that have been going on for several days up to a week or more. Per NP Jobart pt was given a RX for a course of Prednisone and a 10 day RX for Augmentin.  She was calling to advise.

## 2022-08-08 DIAGNOSIS — J329 Chronic sinusitis, unspecified: Secondary | ICD-10-CM | POA: Diagnosis not present

## 2022-08-26 ENCOUNTER — Telehealth: Payer: Self-pay | Admitting: Pharmacist

## 2022-08-26 NOTE — Chronic Care Management (AMB) (Signed)
Chronic Care Management Pharmacy Assistant   Name: Gregory [name]  MRN: [id number] DOB: 01/07/1951  Reason for Encounter: Disease State   Conditions to be addressed/monitored: General Assessment  Recent office visits:  07/04/22 Gregory Lung, MD - Patient presented for Actinic Keratosis and other concerns. Prescribed Fluorouracil 5%  06/17/22 Gregory Lung, MD - Patient presented for Chronic midline thoracic back pain and other concerns. Stopped Prednisone 10 mg.  05/22/22 Gregory Lung, MD - Patient presented for Bilateral carotid bruits and other concerns Stopped Amoxicillin. Stopped Fluticasone  04/15/22 Gregory Lung, MD - Patient presented for Routine General Medical Exam at a health care facility and other concerns. No medication changes.  04/11/22 Gregory Simmering, LPN - Patient presented for Medicare Annual Wellness Exam. Patient voiced goal to stop smoking.  Recent consult visits:  07/09/22 Gregory Sierras, DO (Allergy) - Patient presented for Asthma COPD overlap syndrome and other concerns. No medication changes.  06/02/22 [name], [name], MD (Cardiology) - Patient presented for Coronary Artery Disease involving coronary bypass graft of native heart without angina pectoris and other concerns. Prescribed Amoxicillin. Changed Asprin.  04/28/22 Skotnicki, Mirian Mo, DO (Otolaryngology) - Patient presented for OSA. No medication changes, recommended Saline rinses.  04/21/22 Gregory Sierras, DO (Allergy) - Patient presented for Chronic rhinitis and other concerns. Prescribed Azelastine HCL. Prescribed Trelegy Ellipta. Stopped Advair.  03/28/22 Skotnicki, Mirian Mo, DO (Otolaryngology) - Patient presented for allergic rhinitis and other concerns. Prescribed Xhanse. Prescribed Prednisone.  Hospital visits:  None in previous 6 months  Medications: Outpatient Encounter Medications as of 08/26/2022  Medication Sig Note   acetaminophen (TYLENOL) 325 MG tablet Take 2 tablets (650 mg  total) by mouth every 6 (six) hours as needed for mild pain. 06/17/2022: Prn last dose two hours ago   albuterol (VENTOLIN HFA) 108 (90 Base) MCG/ACT inhaler INHALE TWO PUFFS BY MOUTH EVERY 6 HOURS AS NEEDED FOR WHEEZING 06/17/2022: Prn last dose a week ago   amLODipine (NORVASC) 10 MG tablet Take 1 tablet (10 mg total) by mouth daily.    amoxicillin (AMOXIL) 500 MG capsule Take 4 capsules (2,000 mg total) by mouth once as needed for up to 1 dose. Take 4 tablets one hour before dental work 06/17/2022: Prn for dental appt last dose was a month ago   aspirin EC 81 MG tablet Take 1 tablet (81 mg total) by mouth daily. Swallow whole.    atorvastatin (LIPITOR) 80 MG tablet Take 1 tablet (80 mg total) by mouth daily.    azelastine (ASTELIN) 0.1 % nasal spray Place 1-2 sprays into both nostrils 2 (two) times daily as needed (nasal drainage). Use in each nostril as directed 06/17/2022: Prn last dose this am   divalproex (DEPAKOTE ER) 500 MG 24 hr tablet Take 500 mg by mouth 3 (three) times daily. 2- 3 tablets once daily    finasteride (PROSCAR) 5 MG tablet Take 1 tablet (5 mg total) by mouth daily.    fluorouracil (EFUDEX) 5 % cream Apply topically 2 (two) times daily.    Fluticasone-Umeclidin-Vilant (TRELEGY ELLIPTA) 200-62.5-25 MCG/ACT AEPB Inhale 1 puff into the lungs daily. Rinse mouth after each use.    guaiFENesin 200 MG tablet Take 200 mg by mouth every 4 (four) hours as needed for cough or to loosen phlegm. 06/17/2022: Prn last dose a week ago   lamoTRIgine (LAMICTAL) 200 MG tablet Take 1 tablet (200 mg total) by mouth every morning.    lisinopril-hydrochlorothiazide (ZESTORETIC) 20-12.5  MG tablet Take 1 tablet by mouth every morning.    loratadine (CLARITIN) 10 MG tablet Take 1 tablet by mouth daily.    Multiple Vitamins-Minerals (MULTIVITAMIN ADULTS 50+ PO) Take 1 tablet by mouth daily.     nicotine (NICODERM CQ - DOSED IN MG/24 HOURS) 21 mg/24hr patch Place 21 mg onto the skin daily.    No  facility-administered encounter medications on file as of 08/26/2022.  Contacted Gregory [name] for General Review Call   Adherence Review:  Does the Clinical Pharmacist Assistant have access to adherence rates? Yes Adherence rates for STAR metric medications  Atorvastatin 80 mg - Last filled 05/24/22 90 DS at [facility] Atorvastatin 80 mg - Last filled 02/27/22 90 DS at [facility] Lisinopril - HCTZ 20-12.5 - Last filled 04/10/22 90 DS at [facility] Lisinopril - HCTZ 20-12.5 - Last filled 01/08/22 90 DS at Gregory [name]  Does the patient have >5 day gap between last estimated fill dates for any of the above medications or other medication gaps? Yes    Disease State Questions:  Able to connect with Patient? Yes Did patient have any problems with their health recently? No  Have you had any admissions or emergency room visits or worsening of your condition(s) since last visit? No  Have you had any visits with new specialists or providers since your last visit? No  Have you had any new health care problem(s) since your last visit? No  Have you run out of any of your medications since you last spoke with clinical pharmacist? No  Are there any medications you are not taking as prescribed? No  Are you having any issues or side effects with your medications? No  Do you have any other health concerns or questions you want to discuss with your Clinical Pharmacist before your next visit? No  Are there any health concerns that you feel we can do a better job addressing? No  Are you having any problems with any of the following since the last visit: (select all that apply)  None  12. Any falls since last visit? No   13. Any increased or uncontrolled pain since last visit? No  14. Additional Details? Patient inquired if he and wife should be getting the RSV vaccine and wanted to know Gregory [name]'s thoughts on if it is recommended for them as he has asthma. Message forwarded to [name] Clinical Pharmacist   08/28/22 Per Gregory [name] Gregory [name] is recommending everyone over 60 to get the RSV vaccine and until the office receives vaccines to administer Gregory [name] has been referring patients to get at the Pharmacy and Arexvy should be 100% covered under medicare. Did not reach patient left him a voicemail with my contact information and the above if he has any questions.   Care Gaps: COVID Booster - Overdue Flu Vaccine - Overdue CCM-  Declined at this time BP- 130/82 07/09/22 AWV- 4/23 Lab Results  Component Value Date   HGBA1C 5.9 (H) 10/04/2020    Star Rating Drugs: Atorvastatin 80 mg - Last filled 05/24/22 90 DS at [facility] Lisinopril - HCTZ 20-12.5 - Last filled 04/10/22 90 DS at [place] Pharmacist Assistant [phone]

## 2022-08-27 ENCOUNTER — Encounter: Payer: Self-pay | Admitting: Internal Medicine

## 2022-09-16 ENCOUNTER — Telehealth (INDEPENDENT_AMBULATORY_CARE_PROVIDER_SITE_OTHER): Payer: PPO | Admitting: Family Medicine

## 2022-09-16 VITALS — Temp 97.6°F | Wt 141.0 lb

## 2022-09-16 DIAGNOSIS — U071 COVID-19: Secondary | ICD-10-CM | POA: Diagnosis not present

## 2022-09-16 NOTE — Progress Notes (Signed)
   Subjective:    Patient ID: Gregory [name], male    DOB: [birth year], 71 y.o.   MRN: [id number]  HPI Documentation for virtual audio and video telecommunications through [place] encounter: The patient was located at home. 2 patient identifiers used.  The provider was located in the office. The patient did consent to this visit and is aware of possible charges through their insurance for this visit. The other persons participating in this telemedicine service were none. Time spent on call was 5 minutes and in review of previous records >15  minutes total for counseling and coordination of care. This virtual service is not related to other E/M service within previous 7 days.  He states that on Saturday he developed a cough that intermittently was productive as well as some slight dizziness, sore throat, nasal congestion and postnasal drainage.  This continued through the weekend and on Monday he was mainly having difficulty with sore throat occasional cough as well as intermittent dizziness but no fever, chills, shortness of breath.  His test was positive yesterday.  Review of Systems     Objective:   Physical Exam Alert and in no distress otherwise not examined       Assessment & Plan:  COVID-19 I explained that since he was doing relatively well, we could continue to monitor this and treated like any viral infection with symptomatic care.  Explained that if he develops a fever, worsening cough and shortness of breath we would then pursue that.  He is to say sequestered until Friday and then he can go out with a mask for the next 5 days.  He expressed understanding of this.

## 2022-09-17 ENCOUNTER — Encounter (INDEPENDENT_AMBULATORY_CARE_PROVIDER_SITE_OTHER): Payer: PPO | Admitting: Ophthalmology

## 2022-09-18 ENCOUNTER — Encounter (INDEPENDENT_AMBULATORY_CARE_PROVIDER_SITE_OTHER): Payer: PPO | Admitting: Ophthalmology

## 2022-09-30 ENCOUNTER — Encounter: Payer: Self-pay | Admitting: Internal Medicine

## 2022-09-30 ENCOUNTER — Encounter (INDEPENDENT_AMBULATORY_CARE_PROVIDER_SITE_OTHER): Payer: PPO | Admitting: Ophthalmology

## 2022-09-30 DIAGNOSIS — H35342 Macular cyst, hole, or pseudohole, left eye: Secondary | ICD-10-CM | POA: Diagnosis not present

## 2022-09-30 DIAGNOSIS — I10 Essential (primary) hypertension: Secondary | ICD-10-CM | POA: Diagnosis not present

## 2022-09-30 DIAGNOSIS — H35033 Hypertensive retinopathy, bilateral: Secondary | ICD-10-CM | POA: Diagnosis not present

## 2022-09-30 DIAGNOSIS — H43813 Vitreous degeneration, bilateral: Secondary | ICD-10-CM

## 2022-09-30 DIAGNOSIS — D3132 Benign neoplasm of left choroid: Secondary | ICD-10-CM | POA: Diagnosis not present

## 2022-09-30 DIAGNOSIS — H35373 Puckering of macula, bilateral: Secondary | ICD-10-CM | POA: Diagnosis not present

## 2022-10-05 ENCOUNTER — Other Ambulatory Visit: Payer: Self-pay | Admitting: Allergy

## 2022-10-13 ENCOUNTER — Encounter: Payer: Self-pay | Admitting: Internal Medicine

## 2022-11-20 ENCOUNTER — Other Ambulatory Visit: Payer: Self-pay | Admitting: *Deleted

## 2022-11-20 DIAGNOSIS — I779 Disorder of arteries and arterioles, unspecified: Secondary | ICD-10-CM

## 2022-11-27 ENCOUNTER — Ambulatory Visit (HOSPITAL_COMMUNITY)
Admission: RE | Admit: 2022-11-27 | Discharge: 2022-11-27 | Disposition: A | Discharge status: Home / Self Care | Payer: PPO | Source: Ambulatory Visit | Attending: Vascular Surgery | Admitting: Vascular Surgery

## 2022-11-27 ENCOUNTER — Ambulatory Visit (INDEPENDENT_AMBULATORY_CARE_PROVIDER_SITE_OTHER): Payer: PPO | Admitting: Physician Assistant

## 2022-11-27 VITALS — BP 152/69 | HR 51 | Temp 97.7°F | Wt 138.0 lb

## 2022-11-27 DIAGNOSIS — I779 Disorder of arteries and arterioles, unspecified: Secondary | ICD-10-CM | POA: Insufficient documentation

## 2022-11-27 NOTE — Progress Notes (Signed)
[facility] [place] HISTORY AND PHYSICAL   History of Present Illness:  Patient is a 71 y.o. year old male who presents for evaluation of claudication.  He had progressive thigh and buttock claudication which has progressed to the point where he can only ambulate a minimal distance. He had a CT angiogram that showed severe aortoiliac occlusive disease.     He is here today for follow up surveillance.  He denies new symptoms of claudications, rest pain or non healing wounds.  He does report continued numbness in the left > right foot.  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   He has used nicotine lozenge to asist him smoking cessation.   He is medically managed on ASA and Statin       Past Medical History:  Diagnosis Date   Alcohol abuse    Aortic stenosis    Arthritis    Asthma    exercise indused or pollen-uses inhaler dail yand has rescue inhaler if needed   Bipolar disorder ([place])    CAD (coronary artery disease)    coronary calcifications 08/2013 CTA   Carotid artery occlusion    Cataract    bilateral sx   Chronic back pain    Claudication ([place])    Complication of anesthesia 10/16/2020   "had a panic attack when the mask was put on me"" I do not think I was given anything  before, I need something to calm me down"   Depression    Family history of skin cancer    Heart murmur    as an infant   Hepatitis    h/o 1970,doesn't remember which type,1990 epstain-barr   Hyperlipidemia    Hypertension    Neuromuscular disorder ([place])    Obsessive compulsive disorder    PAD (peripheral artery disease) (HCC)    Peripheral neuropathy    Pneumonia    RBBB    RBBB (right bundle branch block with left anterior fascicular block)    Seasonal allergies    Severe aortic stenosis    Sleep apnea    does not wear CPAP   Smoker    Spinal stenosis at L4-L5 level    Tobacco abuse    Tobacco use disorder     Past Surgical History:   Procedure Laterality Date   ANAL FISTULECTOMY  09/25/2011   AORTA - BILATERAL FEMORAL ARTERY BYPASS GRAFT N/A 10/03/2020   Procedure: AORTA BIFEMORAL BYPASS GRAFT USING A HEMASHIELD GOLD BIFURCATED 14 X 26m GRAFT AND INFERIOR MESENTERIC ARTERY REIMPLANTATION;  Surgeon: BSerafina Mitchell MD;  [facility];  Service: Vascular;  Laterality: N/A;   AORTIC VALVE REPLACEMENT N/A 11/24/2019   Procedure: AORTIC VALVE REPLACEMENT (AVR) using INSPIRIS Resilia 23 MM Bioprosthetic Aortic Valve.;  Surgeon: BGaye Pollack MD;  Location: [facility];  Service: Open Heart Surgery;  Laterality: N/A;   APPLICATION OF WOUND VAC Left 10/17/2020   Procedure: APPLICATION OF WOUND VAC;  Surgeon: BSerafina Mitchell MD;  [facility];  Service: Vascular;  Laterality: Left;   BACK SURGERY     2015   CATARACT EXTRACTION, BILATERAL  2022   COLONOSCOPY  2009   MS-F/V-mov(exc)-HPP   CORONARY ARTERY BYPASS GRAFT N/A 11/24/2019   Procedure: CORONARY ARTERY BYPASS GRAFTING (CABG) using LIMA to LAD.;  Surgeon: BGaye Pollack MD;  [facility];  Service: Open Heart Surgery;  Laterality: N/A;   ELBOW SURGERY  bilaterally for cubital tunnel   ENDARTERECTOMY N/A 10/03/2020   Procedure: AORTIC ENDARTERECTOMY;  Surgeon: Serafina Mitchell, MD;  Location: [place];  Service: Vascular;  Laterality: N/A;   ENDARTERECTOMY FEMORAL Bilateral 10/03/2020   Procedure: BILATERAL FEMORAL ENDARTERECTOMY;  Surgeon: Serafina Mitchell, MD;  Location: [place];  Service: Vascular;  Laterality: Bilateral;   HERNIA REPAIR     with mesh   INCISION AND DRAINAGE Left 01/02/2021   Procedure: INCISION AND DRAINAGE LEFT GROIN WITH STIMULAN BEADS;  Surgeon: Serafina Mitchell, MD;  Location: [place];  Service: Vascular;  Laterality: Left;   INCISION AND DRAINAGE OF WOUND Left 10/17/2020   Procedure: EXPLORATION LEFT GROIN WOUND;  Surgeon: Serafina Mitchell, MD;  [facility];  Service: Vascular;  Laterality: Left;   MAXIMUM ACCESS (MAS)POSTERIOR LUMBAR  INTERBODY FUSION (PLIF) 1 LEVEL N/A 10/25/2014   Procedure: LUMBAR FOUR TO FIVE MAXIMUM ACCESS (MAS) POSTERIOR LUMBAR INTERBODY FUSION (PLIF) 1 LEVEL;  Surgeon: Eustace Moore, MD;  Location: [place] [facility];  Service: Neurosurgery;  Laterality: N/A;   RIGHT/LEFT HEART CATH AND CORONARY ANGIOGRAPHY N/A 11/10/2019   Procedure: RIGHT/LEFT HEART CATH AND CORONARY ANGIOGRAPHY;  Surgeon: [name], [name], MD;  Location: [place] [facility];  Service: Cardiovascular;  Laterality: N/A;   SKIN BIOPSY Left 10/12/2018   shave forehead Hypertrophic actinic kertosis with features of a verruca   TEE WITHOUT CARDIOVERSION N/A 11/24/2019   Procedure: TRANSESOPHAGEAL ECHOCARDIOGRAM (TEE);  Surgeon: Gaye Pollack, MD;  Location: [place];  Service: Open Heart Surgery;  Laterality: N/A;   TONSILLECTOMY  age 58   VASECTOMY     X 2   VASECTOMY REVERSAL      ROS:   General:  No weight loss, Fever, chills  HEENT: No recent headaches, no nasal bleeding, no visual changes, no sore throat  Neurologic: No dizziness, blackouts, seizures. No recent symptoms of stroke or mini- stroke. No recent episodes of slurred speech, or temporary blindness.  Cardiac: No recent episodes of chest pain/pressure, no shortness of breath at rest.  No shortness of breath with exertion.  Denies history of atrial fibrillation or irregular heartbeat  Vascular: No history of rest pain in feet.  No history of claudication.  No history of non-healing ulcer, No history of DVT   Pulmonary: No home oxygen, no productive cough, no hemoptysis,  positive asthma or wheezing  Musculoskeletal:  '[ ]'$  Arthritis, '[ ]'$  Low back pain,  [ x] Joint pain  Hematologic:No history of hypercoagulable state.  No history of easy bleeding.  No history of anemia  Gastrointestinal: No hematochezia or melena,  No gastroesophageal reflux, no trouble swallowing  Urinary: '[ ]'$  chronic Kidney disease, '[ ]'$  on HD - '[ ]'$  MWF or '[ ]'$  TTHS, '[ ]'$  Burning with urination, '[ ]'$  Frequent  urination, '[ ]'$  Difficulty urinating;   Skin: No rashes  Psychological: No history of anxiety,  No history of depression  Social History Social History   Tobacco Use   Smoking status: Every Day    Packs/day: 0.50    Years: 25.00    Total pack years: 12.50    Types: Cigarettes   Smokeless tobacco: Never   Tobacco comments:    Pt had stopped smoking X10 years/ currently smoking a pack a day   Vaping Use   Vaping Use: Never used  Substance Use Topics   Alcohol use: Not Currently    Comment: 06/26/2014- last drink- AA   Drug use: No  Comment: former alcoholic    Family History Family History  Problem Relation Age of Onset   Colon polyps Mother 92   Heart failure Mother    Stroke Father 12   Cancer Father        skin   Drug abuse Father    Hypertension Brother    Diabetes Brother    Drug abuse Brother    Heart disease Brother        s/p CABG, pacemaker   Cancer Maternal Aunt        skin   Cancer Maternal Grandmother        liver   Colon cancer Neg Hx    Esophageal cancer Neg Hx    Rectal cancer Neg Hx    Stomach cancer Neg Hx     Allergies  Allergies  Allergen Reactions   Codeine Anaphylaxis     Current Outpatient Medications  Medication Sig Dispense Refill   acetaminophen (TYLENOL) 325 MG tablet Take 2 tablets (650 mg total) by mouth every 6 (six) hours as needed for mild pain.     albuterol (VENTOLIN HFA) 108 (90 Base) MCG/ACT inhaler INHALE TWO PUFFS BY MOUTH EVERY 6 HOURS AS NEEDED FOR WHEEZING (Patient not taking: Reported on 09/16/2022) 8.5 g 1   amLODipine (NORVASC) 10 MG tablet Take 1 tablet (10 mg total) by mouth daily. 90 tablet 3   amoxicillin (AMOXIL) 500 MG capsule Take 4 capsules (2,000 mg total) by mouth once as needed for up to 1 dose. Take 4 tablets one hour before dental work (Patient not taking: Reported on 09/16/2022) 4 capsule 11   aspirin EC 81 MG tablet Take 1 tablet (81 mg total) by mouth daily. Swallow whole. 90 tablet 3    atorvastatin (LIPITOR) 80 MG tablet Take 1 tablet (80 mg total) by mouth daily. 90 tablet 3   azelastine (ASTELIN) 0.1 % nasal spray Place 1-2 sprays into both nostrils 2 (two) times daily as needed (nasal drainage). Use in each nostril as directed 30 mL 5   divalproex (DEPAKOTE ER) 500 MG 24 hr tablet Take 500 mg by mouth 3 (three) times daily. 2- 3 tablets once daily     finasteride (PROSCAR) 5 MG tablet Take 1 tablet (5 mg total) by mouth daily. 90 tablet 3   Fluticasone-Umeclidin-Vilant (TRELEGY ELLIPTA) 200-62.5-25 MCG/ACT AEPB INHALE ONE PUFF BY MOUTH DAILY - RINSE MOUTH AFTER EACH USE 180 each 1   guaiFENesin 200 MG tablet Take 200 mg by mouth every 4 (four) hours as needed for cough or to loosen phlegm.     lamoTRIgine (LAMICTAL) 200 MG tablet Take 1 tablet (200 mg total) by mouth every morning. 90 tablet 3   lisinopril-hydrochlorothiazide (ZESTORETIC) 20-12.5 MG tablet Take 1 tablet by mouth every morning. 90 tablet 2   loratadine (CLARITIN) 10 MG tablet Take 1 tablet by mouth daily.     Multiple Vitamins-Minerals (MULTIVITAMIN ADULTS 50+ PO) Take 1 tablet by mouth daily.      nicotine (NICODERM CQ - DOSED IN MG/24 HOURS) 21 mg/24hr patch Place 21 mg onto the skin daily.     No current facility-administered medications for this visit.    Physical Examination  Vitals:   11/27/22 1244  BP: (!) 152/69  Pulse: (!) 51  Temp: 97.7 F (36.5 C)  TempSrc: Temporal  SpO2: 98%  Weight: 138 lb (62.6 kg)    Body mass index is 22.62 kg/m.  General:  Alert and oriented, no acute distress HEENT: Normal Neck:  No bruit or JVD Pulmonary: wheezing to auscultation bilaterally Cardiac: Regular Rate and Rhythm without murmur Abdomen: Soft, non-tender, non-distended, no mass, no scars Skin: No rash Extremity Pulses:  femoral, dorsalis pedis, pulses bilaterally Musculoskeletal: No deformity or edema  Neurologic: Upper and lower extremity motor 5/5 and symmetric  DATA:  ABI Findings:   +---------+------------------+-----+--------+--------+  Right   Rt Pressure (mmHg)IndexWaveformComment   +---------+------------------+-----+--------+--------+  Brachial 142                                      +---------+------------------+-----+--------+--------+  PTA     143               1.00 biphasic          +---------+------------------+-----+--------+--------+  DP      156               1.09 biphasic          +---------+------------------+-----+--------+--------+  Great Toe102               0.71                   +---------+------------------+-----+--------+--------+   +---------+------------------+-----+--------+-------+  Left    Lt Pressure (mmHg)IndexWaveformComment  +---------+------------------+-----+--------+-------+  Brachial 143                                     +---------+------------------+-----+--------+-------+  PTA     118               0.83 biphasic         +---------+------------------+-----+--------+-------+  DP      123               0.86 biphasic         +---------+------------------+-----+--------+-------+  Great Toe63                0.44                  +---------+------------------+-----+--------+-------+   +-------+-----------+-----------+------------+------------+  ABI/TBIToday's ABIToday's TBIPrevious ABIPrevious TBI  +-------+-----------+-----------+------------+------------+  Right 1.09       0.71       1.08        0.74          +-------+-----------+-----------+------------+------------+  Left  0.83       0.44       0.94        0.49          +-------+-----------+-----------+------------+------------+        Previous ABI on 07/22/21.    Summary:  Right: Resting right ankle-brachial index is within normal range. The  right toe-brachial index is normal.   Left: Resting left ankle-brachial index indicates mild left lower  extremity arterial disease. The left  toe-brachial index is abnormal.   ASSESSMENT/PLAN: This is a 71 y.o. male who is s/p aortobifemoral bypass by Dr. Trula Slade. He had symptoms of life limiting claudication due to aortic occlusive disease.   He denies return of symptoms of claudication, non healing wounds or rest pain.  He works full time.  No change in the ABI's and he has palpable pedal pulses.  He stays active. Continue to work on tobacco secession.  I will have him f/u in 1 year.  If he develops symptoms of ischemia he will call our office  Roxy Horseman PA-C [facility] Office: [phone]  MD in clinic [place]

## 2022-12-30 ENCOUNTER — Encounter: Payer: Self-pay | Admitting: *Deleted

## 2022-12-30 ENCOUNTER — Telehealth (INDEPENDENT_AMBULATORY_CARE_PROVIDER_SITE_OTHER): Payer: PPO | Admitting: Nurse Practitioner

## 2022-12-30 ENCOUNTER — Encounter: Payer: Self-pay | Admitting: Nurse Practitioner

## 2022-12-30 VITALS — Temp 97.8°F | Ht 65.0 in | Wt 141.0 lb

## 2022-12-30 DIAGNOSIS — J0140 Acute pansinusitis, unspecified: Secondary | ICD-10-CM | POA: Diagnosis not present

## 2022-12-30 MED ORDER — AMOXICILLIN-POT CLAVULANATE 875-125 MG PO TABS: 1.0000 | ORAL_TABLET | Freq: Two times a day (BID) | ORAL | 0 refills | Status: DC

## 2022-12-30 MED ORDER — PREDNISONE 20 MG PO TABS: ORAL_TABLET | ORAL | 0 refills | Status: DC

## 2022-12-30 NOTE — Progress Notes (Signed)
Virtual Visit Encounter mychart visit.   I connected with  Gregory [name] on 01/04/23 at  1:30 PM EST by secure video and audio telemedicine application. I verified that I am speaking with the correct person using two identifiers.   I introduced myself as a Designer, jewellery with the practice. The limitations of evaluation and management by telemedicine discussed with the patient and the availability of in person appointments. The patient expressed verbal understanding and consent to proceed.  Participating parties in this visit include: Myself and patient  The patient is: Patient Location: Home I am: Provider Location: Office/Clinic Subjective:    CC and HPI: Gregory [name] is a 72 y.o. year old male presenting for new evaluation and treatment of sinus pan and pressure. Patient reports the following: Sinus pain and pressure with cough that started last Thursday. Slowly progressed with worsening pressure in the face, eyes, and teeth. He is also experiencing ear pain, mucous production, and intermittent dizziness. He is concerned about a possible sinus infection. His COVID tests at home have been negative. He is planning to go see if elderly mother this weekend and would like to ensure that this is a safe option for him.    Past medical history, Surgical history, Family history not pertinant except as noted below, Social history, Allergies, and medications have been entered into the medical record, reviewed, and corrections made.   Review of Systems:  All review of systems negative except what is listed in the HPI  Objective:    Alert and oriented x 4 Audible congestion and cough present.  Speaking in clear sentences with no shortness of breath. No distress.  Impression and Recommendations:    Problem List Items Addressed This Visit     Acute non-recurrent pansinusitis - Primary    Symptoms and presentation consistent with sinusitis given the length of time symptoms have  been present, suspect this is bacterial in nature. We will begin treatment today with antibiotic therapy and add steroid burst given history of COPD/Asthma and severity of symptoms. No alarm sx are present today. I do recommend cough and deep breathing exercises and supportive therapy with mucinex and humidified air to help thin secretions. We discussed that as long as he is having improvement of symptoms and no fevers, he should be safe to visit his mother over the weekend given that he will be over a week from the onset of symptoms and on antibiotic therapy. We discussed if his symptoms change for the worse or he begins to have fevers, he would be best to postpone the visit to be safe. He will f/u PRN.       Relevant Medications   Dextromethorphan-guaiFENesin 5-100 MG/5ML LIQD   amoxicillin-clavulanate (AUGMENTIN) 875-125 MG tablet   predniSONE (DELTASONE) 20 MG tablet    orders and follow up as documented in EMR I discussed the assessment and treatment plan with the patient. The patient was provided an opportunity to ask questions and all were answered. The patient agreed with the plan and demonstrated an understanding of the instructions.   The patient was advised to call back or seek an in-person evaluation if the symptoms worsen or if the condition fails to improve as anticipated.  Follow-Up: prn  I provided 20 minutes of non-face-to-face interaction with this non face-to-face encounter including intake, same-day documentation, and chart review.   Orma Render, NP , DNP, AGNP-c [facility]

## 2023-01-04 DIAGNOSIS — J0140 Acute pansinusitis, unspecified: Secondary | ICD-10-CM | POA: Insufficient documentation

## 2023-01-04 NOTE — Assessment & Plan Note (Signed)
Symptoms and presentation consistent with sinusitis given the length of time symptoms have been present, suspect this is bacterial in nature. We will begin treatment today with antibiotic therapy and add steroid burst given history of COPD/Asthma and severity of symptoms. No alarm sx are present today. I do recommend cough and deep breathing exercises and supportive therapy with mucinex and humidified air to help thin secretions. We discussed that as long as he is having improvement of symptoms and no fevers, he should be safe to visit his mother over the weekend given that he will be over a week from the onset of symptoms and on antibiotic therapy. We discussed if his symptoms change for the worse or he begins to have fevers, he would be best to postpone the visit to be safe. He will f/u PRN.

## 2023-01-07 ENCOUNTER — Encounter: Payer: Self-pay | Admitting: Gastroenterology

## 2023-01-13 ENCOUNTER — Encounter: Payer: Self-pay | Admitting: Gastroenterology

## 2023-02-03 ENCOUNTER — Ambulatory Visit (AMBULATORY_SURGERY_CENTER): Payer: PPO | Admitting: *Deleted

## 2023-02-03 VITALS — Ht 65.5 in | Wt 139.0 lb

## 2023-02-03 DIAGNOSIS — Z86010 Personal history of colonic polyps: Secondary | ICD-10-CM

## 2023-02-03 MED ORDER — PEG 3350-KCL-NA BICARB-NACL 420 G PO SOLR: 4000.0000 mL | Freq: Once | ORAL | 0 refills | Status: AC

## 2023-02-03 NOTE — Progress Notes (Signed)
No egg or soy allergy known to patient  No issues known to pt with past sedation with any surgeries or procedures Patient denies ever being told they had issues or difficulty with intubation  No FH of Malignant Hyperthermia Pt is not on diet pills Pt is not on  home 02  Pt is not on blood thinners  Pt denies issues with constipation  Pt is not on dialysis Pt denies any upcoming cardiac testing Pt encouraged to use to use Singlecare or Goodrx to reduce cost  Patient's chart reviewed by Osvaldo Angst CNRA prior to previsit and patient appropriate for the [place].  Previsit completed and red dot placed by patient's name on their procedure day (on provider's schedule).  ., Weight per PT Visit by phone Instructions reviewed with pt and pt states understanding. Instructed to review again prior to procedure. Pt states they will.  Instructions sent by mail and my chart

## 2023-02-04 ENCOUNTER — Telehealth: Payer: Self-pay | Admitting: Family Medicine

## 2023-02-04 NOTE — Progress Notes (Unsigned)
Patient ID: Gregory [name], male   DOB: Sep 28, 1951, 72 y.o.   MRN: [id number]  Care Management & Coordination Services Pharmacy Team  Reason for Encounter: General adherence update   Contacted patient for general health update and medication adherence call.  Spoke with patient on 02/04/2023    What concerns do you have about your medications?Patient reports that he is in the donut hole already with his Trelegy and it is not affordable for him. He reports he has attempted to get patient assistance for this medication and was denied due to he and his wifes income. He reports this medication was prescribed by Dr. Maudie Mercury Allergist and he is now using his expired Advair as he was unable to pick up Trelegy last fill at 200.00. He currently still uses his Albuterol as needed for exercise induced asthma and yard work .  Patient would like to know if there are any options for him or if he can have the Advair renewed at a higher strength.  The patient denies side effects with their medications.   How often do you forget or accidentally miss a dose? Never  Do you use a pillbox? No  Are you having any problems getting your medications from your pharmacy? Yes  The patient has not had an ED visit since last contact.   The patient denies problems with their health.   Counseled patient on: Access to carecoordination team for any cost, medication or pharmacy concerns.   Chart Updates:  Recent office visits:  12/30/22 Early, Coralee Pesa, NP - Patient presented via video for Sore Throat. Prescribed Amoxicillin. Prescribed Prednisone. Increased Acetaminophen.  09/16/22  [name], MD- Patient presented for COVID 19 Infection. Stopped  Fluorouracil.  Recent consult visits:  02/03/23 Patient presented to [facility] for Colonoscopy.   11/27/22 Ulyses Amor, PA-C  - Patient presented for Peripheral arterial occlusive disease. No medication changes.   Hospital visits:  None  in previous 6 months  Medications: Outpatient Encounter Medications as of 02/04/2023  Medication Sig Note   acetaminophen (TYLENOL) 650 MG CR tablet Take 650 mg by mouth every 8 (eight) hours as needed for pain. 12/30/2022: Last dose last night   albuterol (VENTOLIN HFA) 108 (90 Base) MCG/ACT inhaler INHALE TWO PUFFS BY MOUTH EVERY 6 HOURS AS NEEDED FOR WHEEZING 12/30/2022: As needed   amLODipine (NORVASC) 10 MG tablet Take 1 tablet (10 mg total) by mouth daily.    amoxicillin (AMOXIL) 500 MG capsule Take 4 capsules (2,000 mg total) by mouth once as needed for up to 1 dose. Take 4 tablets one hour before dental work (Patient not taking: Reported on 09/16/2022) 06/17/2022: Prn for dental appt last dose was a month ago   amoxicillin-clavulanate (AUGMENTIN) 875-125 MG tablet Take 1 tablet by mouth 2 (two) times daily.    aspirin EC 81 MG tablet Take 1 tablet (81 mg total) by mouth daily. Swallow whole.    atorvastatin (LIPITOR) 80 MG tablet Take 1 tablet (80 mg total) by mouth daily.    azelastine (ASTELIN) 0.1 % nasal spray Place 1-2 sprays into both nostrils 2 (two) times daily as needed (nasal drainage). Use in each nostril as directed 12/30/2022: prn   Dextromethorphan-guaiFENesin 5-100 MG/5ML LIQD Take 20 mLs by mouth every 4 (four) hours. (Patient not taking: Reported on 02/03/2023) 12/30/2022: Last dose last night 11pm   divalproex (DEPAKOTE ER) 500 MG 24 hr tablet Take 500 mg by mouth 3 (three) times daily. 2- 3  tablets once daily    Fexofenadine HCl (ALLEGRA ALLERGY PO) Take by mouth.    finasteride (PROSCAR) 5 MG tablet Take 1 tablet (5 mg total) by mouth daily.    Fluticasone-Umeclidin-Vilant (TRELEGY ELLIPTA) 200-62.5-25 MCG/ACT AEPB INHALE ONE PUFF BY MOUTH DAILY - RINSE MOUTH AFTER EACH USE (Patient not taking: Reported on 02/03/2023)    guaiFENesin 200 MG tablet Take 200 mg by mouth every 4 (four) hours as needed for cough or to loosen phlegm. (Patient not taking: Reported on 02/03/2023) 09/16/2022:  Prn last dose last night   lamoTRIgine (LAMICTAL) 200 MG tablet Take 1 tablet (200 mg total) by mouth every morning.    lisinopril-hydrochlorothiazide (ZESTORETIC) 20-12.5 MG tablet Take 1 tablet by mouth every morning.    loratadine (CLARITIN) 10 MG tablet Take 1 tablet by mouth daily.    Multiple Vitamins-Minerals (MULTIVITAMIN ADULTS 50+ PO) Take 1 tablet by mouth daily.     nicotine (NICODERM CQ - DOSED IN MG/24 HOURS) 21 mg/24hr patch Place 21 mg onto the skin daily. (Patient not taking: Reported on 02/03/2023)    nicotine polacrilex (COMMIT) 4 MG lozenge Take 4 mg by mouth as needed for smoking cessation.    predniSONE (DELTASONE) 20 MG tablet Take 69m PO daily x 2 days, then448mPO daily x 2 days, then 2058mO daily x 3 days    No facility-administered encounter medications on file as of 02/04/2023.    Recent vitals BP Readings from Last 3 Encounters:  11/27/22 (!) 152/69  07/09/22 130/82  07/04/22 128/60   Pulse Readings from Last 3 Encounters:  11/27/22 (!) 51  07/09/22 68  07/04/22 (!) 57   Wt Readings from Last 3 Encounters:  02/03/23 139 lb (63 kg)  12/30/22 141 lb (64 kg)  11/27/22 138 lb (62.6 kg)   BMI Readings from Last 3 Encounters:  02/03/23 22.78 kg/m  12/30/22 23.46 kg/m  11/27/22 22.62 kg/m    Recent lab results    Component Value Date/Time   NA 140 04/15/2022 1439   K 4.5 04/15/2022 1439   CL 100 04/15/2022 1439   CO2 25 04/15/2022 1439   GLUCOSE 120 (H) 04/15/2022 1439   GLUCOSE 82 01/02/2021 1039   BUN 16 04/15/2022 1439   CREATININE 0.96 04/15/2022 1439   CREATININE 0.77 06/29/2017 1534   CALCIUM 9.6 04/15/2022 1439    Lab Results  Component Value Date   CREATININE 0.96 04/15/2022   EGFR 85 04/15/2022   GFRNONAA >60 01/02/2021   GFRAA 98 04/16/2020   Lab Results  Component Value Date/Time   HGBA1C 5.9 (H) 10/04/2020 03:32 AM   HGBA1C 5.9 (H) 11/22/2019 11:45 AM    Lab Results  Component Value Date   CHOL 91 (L) 04/15/2022   HDL  35 (L) 04/15/2022   LDLCALC 33 04/15/2022   TRIG 126 04/15/2022   CHOLHDL 2.6 04/15/2022    Care Gaps: AWV - 04/11/22 Cologuard - Overdue COVID Booster - Overdue  Pharmacist Visit 02/12/23  Star Rating Drugs:  Atorvastatin 80 mg - [facility] - [facility]23 [facility] [phone]

## 2023-02-10 NOTE — Progress Notes (Unsigned)
Care Management & Coordination Services Pharmacy Note  02/10/2023 Name:  Gregory [name] MRN:  [id number] DOB:  1951-06-24  Summary: Trelegy PAP denied, pt cannot afford, samples provided Recommend restart advair 500/50, generic is $5 copay/month Restart nicotine patch use, remove at night due to nightmares  Recommendations/Changes made from today's visit: [place] 500-50 due to cost issues with trelegy, pending PCP approval Restart Nicotine  patch, remove at night to alleviate nightmares  Follow up plan: Smoking cessation review check in 1-2 weeks Pharmacist visit in 4 months   Subjective: Gregory [name] is an 72 y.o. year old male who is a primary patient of [name], MD.  The care coordination team was consulted for assistance with disease management and care coordination needs.    Engaged with patient face to face for follow up visit.  Recent office visits: 12/30/22 Early, Coralee Pesa, NP - Patient presented via video for Sore Throat. Prescribed Amoxicillin. Prescribed Prednisone. Increased Acetaminophen.   09/16/22 [name], MD- Patient presented for COVID 19 Infection. Stopped  Fluorouracil.  Recent consult visits: 02/03/23 Patient presented to [facility] for Colonoscopy.    11/27/22 Ulyses Amor, PA-C  - Patient presented for Peripheral arterial occlusive disease. No medication changes.  Hospital visits: None in previous 6 months   Objective:  Lab Results  Component Value Date   CREATININE 0.96 04/15/2022   BUN 16 04/15/2022   EGFR 85 04/15/2022   GFRNONAA >60 01/02/2021   GFRAA 98 04/16/2020   NA 140 04/15/2022   K 4.5 04/15/2022   CALCIUM 9.6 04/15/2022   CO2 25 04/15/2022   GLUCOSE 120 (H) 04/15/2022    Lab Results  Component Value Date/Time   HGBA1C 5.9 (H) 10/04/2020 03:32 AM   HGBA1C 5.9 (H) 11/22/2019 11:45 AM    Last diabetic Eye exam: No results found for: "HMDIABEYEEXA"  Last diabetic Foot exam:  No results found for: "HMDIABFOOTEX"   Lab Results  Component Value Date   CHOL 91 (L) 04/15/2022   HDL 35 (L) 04/15/2022   LDLCALC 33 04/15/2022   TRIG 126 04/15/2022   CHOLHDL 2.6 04/15/2022       Latest Ref Rng & Units 04/15/2022    2:39 PM 04/03/2021    2:55 PM 10/05/2020    1:54 AM  Hepatic Function  Total Protein 6.0 - 8.5 g/dL 6.9  7.5  5.4   Albumin 3.7 - 4.7 g/dL 4.4  4.6  3.1   AST 0 - 40 IU/L 31  26  64   ALT 0 - 44 IU/L 25  23  30   $ Alk Phosphatase 44 - 121 IU/L 64  71  30   Total Bilirubin 0.0 - 1.2 mg/dL 0.3  0.3  0.4         Latest Ref Rng & Units 04/15/2022    2:39 PM 01/02/2021   10:39 AM 10/17/2020    9:07 AM  CBC  WBC 3.4 - 10.8 x10E3/uL 6.4  5.9    Hemoglobin 13.0 - 17.7 g/dL 13.9  13.0  9.9   Hematocrit 37.5 - 51.0 % 39.0  39.9  29.0   Platelets 150 - 450 x10E3/uL 170  200      Lab Results  Component Value Date/Time   VITAMINB12 636 08/31/2013 08:08 AM   VITAMINB12 756 07/09/2011 02:35 PM    Clinical ASCVD: No  The ASCVD Risk score (Arnett DK, et al., 2019) failed to calculate for the following reasons:  The valid total cholesterol range is 130 to 320 mg/dL        04/11/2022    8:53 AM 04/03/2021    1:45 PM 03/22/2020   10:31 AM  Depression screen PHQ 2/9  Decreased Interest 0 0 0  Down, Depressed, Hopeless 0 0 0  PHQ - 2 Score 0 0 0  Altered sleeping 0    Tired, decreased energy 0    Change in appetite 0    Feeling bad or failure about yourself  0    Trouble concentrating 0    Moving slowly or fidgety/restless 0    Suicidal thoughts 0    PHQ-9 Score 0    Difficult doing work/chores Not difficult at all       Social History   Tobacco Use  Smoking Status Every Day   Packs/day: 0.50   Years: 25.00   Total pack years: 12.50   Types: Cigarettes  Smokeless Tobacco Never  Tobacco Comments   Pt had stopped smoking X10 years/ currently smoking a pack a day    BP Readings from Last 3 Encounters:  11/27/22 (!) 152/69  07/09/22  130/82  07/04/22 128/60   Pulse Readings from Last 3 Encounters:  11/27/22 (!) 51  07/09/22 68  07/04/22 (!) 57   Wt Readings from Last 3 Encounters:  02/03/23 139 lb (63 kg)  12/30/22 141 lb (64 kg)  11/27/22 138 lb (62.6 kg)   BMI Readings from Last 3 Encounters:  02/03/23 22.78 kg/m  12/30/22 23.46 kg/m  11/27/22 22.62 kg/m    Allergies  Allergen Reactions   Codeine Anaphylaxis    Medications Reviewed Today     Reviewed by Jacqualine Code, RN (Registered Nurse) on 02/03/23 at 50  Med List Status: <None>   Medication Order Taking? Sig Documenting Provider Last Dose Status Informant  acetaminophen (TYLENOL) 650 MG CR tablet [id number] Yes Take 650 mg by mouth every 8 (eight) hours as needed for pain. [provider] Taking Active            Med Note Tamala Julian, Miguel Rota Dec 30, 2022 11:53 AM) Last dose last night  albuterol (VENTOLIN HFA) 108 (90 Base) MCG/ACT inhaler [id number] Yes INHALE TWO PUFFS BY MOUTH EVERY 6 HOURS AS NEEDED FOR WHEEZING [name], MD Taking Active            Med Note Tamala Julian, Miguel Rota Dec 30, 2022 11:54 AM) As needed  amLODipine (NORVASC) 10 MG tablet [id number] Yes Take 1 tablet (10 mg total) by mouth daily. [name], MD Taking Active   amoxicillin (AMOXIL) 500 MG capsule [id number] No Take 4 capsules (2,000 mg total) by mouth once as needed for up to 1 dose. Take 4 tablets one hour before dental work  Patient not taking: Reported on 09/16/2022   [name], [name], MD Not Taking Active            Med Note Mallie Mussel, [facility]   Tue Jun 17, 2022  1:32 PM) Prn for dental appt last dose was a month ago  amoxicillin-clavulanate (AUGMENTIN) 875-125 MG tablet [id number]  Take 1 tablet by mouth 2 (two) times daily. Orma Render, NP  Active   aspirin EC 81 MG tablet [id number] Yes Take 1 tablet (81 mg total) by mouth daily. Swallow whole. [name], [name], MD Taking Active   atorvastatin (LIPITOR) 80 MG tablet [id number] Yes Take 1  tablet (80 mg total) by mouth daily.  [name], MD Taking Active   azelastine (ASTELIN) 0.1 % nasal spray [id number] Yes Place 1-2 sprays into both nostrils 2 (two) times daily as needed (nasal drainage). Use in each nostril as directed Garnet Sierras, DO Taking Active            Med Note Tamala Julian, Miguel Rota Dec 30, 2022 11:54 AM) prn  Dextromethorphan-guaiFENesin 5-100 MG/5ML LIQD [id number] No Take 20 mLs by mouth every 4 (four) hours.  Patient not taking: Reported on 02/03/2023   [provider] Not Taking Active            Med Note Tamala Julian, Miguel Rota Dec 30, 2022 11:53 AM) Last dose last night 11pm  divalproex (DEPAKOTE ER) 500 MG 24 hr tablet [id number] Yes Take 500 mg by mouth 3 (three) times daily. 2- 3 tablets once daily [provider] Taking Active   Fexofenadine HCl (ALLEGRA ALLERGY PO) [id number] Yes Take by mouth. [provider] Taking Active   finasteride (PROSCAR) 5 MG tablet [id number] Yes Take 1 tablet (5 mg total) by mouth daily. [name], MD Taking Active   Fluticasone-Umeclidin-Vilant [facility] ELLIPTA) 200-62.5-25 MCG/ACT AEPB [id number] No INHALE ONE PUFF BY MOUTH DAILY - RINSE MOUTH AFTER EACH USE  Patient not taking: Reported on 02/03/2023   Garnet Sierras, DO Not Taking Active   guaiFENesin 200 MG tablet [id number] No Take 200 mg by mouth every 4 (four) hours as needed for cough or to loosen phlegm.  Patient not taking: Reported on 02/03/2023   [provider] Not Taking Active            Med Note Mallie Mussel, [facility]   Tue Sep 16, 2022 10:15 AM) Prn last dose last night  lamoTRIgine (LAMICTAL) 200 MG tablet [id number] Yes Take 1 tablet (200 mg total) by mouth every morning. [name], MD Taking Active Self  lisinopril-hydrochlorothiazide (ZESTORETIC) 20-12.5 MG tablet [id number] Yes Take 1 tablet by mouth every morning. [name], MD Taking Active   loratadine (CLARITIN) 10 MG tablet [id number] Yes Take 1 tablet by mouth  daily. [provider] Taking Active   Multiple Vitamins-Minerals (MULTIVITAMIN ADULTS 50+ PO) [id number] Yes Take 1 tablet by mouth daily.  [provider] Taking Active Self  nicotine (NICODERM CQ - DOSED IN MG/24 HOURS) 21 mg/24hr patch [id number] No Place 21 mg onto the skin daily.  Patient not taking: Reported on 02/03/2023   [provider] Not Taking Active   nicotine polacrilex (COMMIT) 4 MG lozenge [id number] Yes Take 4 mg by mouth as needed for smoking cessation. [provider] Taking Active   predniSONE (DELTASONE) 20 MG tablet [id number]  Take 40m PO daily x 2 days, then49mPO daily x 2 days, then 2094mO daily x 3 days Early, SarCoralee PesaP  Active   Med List Note ([facility] [id number]'s wife states pt has been non-compliant will all his medications. Pt claims to be taking meds regularly            SDOH:  (Social Determinants of Health) assessments and interventions performed: Yes SDOH Interventions    Flowsheet Row Clinical Support from 04/11/2022 in [facility] from 11/07/2021 in [facility]    Transportation Interventions -- Intervention Not Indicated  Depression Interventions/Treatment  PHQ2-9 Score <4 Follow-up Not Indicated --  Financial Strain Interventions -- Intervention Not Indicated  Medication Assistance:  Denied PAP for Trelegy, exceeds income limit  Medication Access: Within the past 30 days, how often has patient missed a dose of medication? None Is a pillbox or other method used to improve adherence? Yes  Factors that may affect medication adherence? financial need Are meds synced by current pharmacy? No  Are meds delivered by current pharmacy? No  Does patient experience delays in picking up medications due to transportation concerns? No   [facility] Reviewed: Is patient disadvantaged to use [facility]?:  Yes Current Rx insurance plan: HTA Name and location of Current pharmacy:  [facility] [place], [place] [address] Phone: [phone] Fax: [facility] [id number] - [place], [place] - [address] [place] [address] Phone: [phone] Fax: [phone]  [facility] services reviewed with patient today?: No  Patient requests to transfer care to [facility]?: No  Reason patient declined to change pharmacies: Disadvantaged due to insurance/mail order  Compliance/Adherence/Medication fill history: Care Gaps: Vaccines: COVID/Tdap  Star-Rating Drugs: Atorvastatin 58m PDC 100% Lisinopril-HCTZ 20-12.534mPDC 94%   Assessment/Plan  COPD/Asthma(Goal: control symptoms and prevent exacerbations) -Controlled -Current treatment  Albuterol HFA 2 puffs q6h prn Appropriate, Effective, Safe, Accessible Trelegy 200 1 puff daily Appropriate, Effective, Safe, Query Accessible Loratadine 1023m qd Appropriate, Effective, Safe, Accessible -Medications previously tried: Pulmicort, Advair  -Gold Grade: Gold 2 (FEV1 50-79%) -Current COPD Classification:  A (low sx, 0-1 moderate exacerbations, no hospitalizations) -Pulmonary function testing: 73% FEV/FVC 2017 -Exacerbations requiring treatment in last 6 months: 1 -Patient reports consistent use of maintenance inhaler -Frequency of rescue inhaler use: for exercise only (5 days a week) -Counseled on Proper inhaler technique; Benefits of consistent maintenance inhaler use When to use rescue inhaler -Recommended switch back to advair inhaler due to cost issues with trelegy Assessed cost and copay is $5/month and would not put patient in donut hole for generic -Provided samples of trelegy in office while we await provider response for switch back to advair  Tobacco use (Goal smoking  cessation) -Uncontrolled -Previous quit attempts: Numerous -Current treatment  Nicotine 4mg8mzenges -Patient smokes After 30 minutes of waking -Patient triggers include: drinking coffee and taking a work break and seeing someone else smoke -On a scale of 1-10, reports MOTIVATION to quit is 10 -Counseled on patch placement, side effects, and option to remove at night if they experience trouble sleeping or bad dreams.  Counseled to allow lozenge to dissolve and absorb in cheek pocket, rather than swallow, to reduce GI side effects. Provided contact information for [place] [facility] (1-800-QUIT-NOW) and encouraged patient to reach out to this group for support. -Recommended to continue current medication Recommended removing nicotine patch at night as patient stopped use for nightmares, will follow up next week to see if this worked. -Currently smoking 10-12 cigarettes/day, goal of down to 5 with use of nicotine patch in 1-2 weeks  [facility]ge[facility]armacist [phone]

## 2023-02-11 ENCOUNTER — Telehealth: Payer: Self-pay

## 2023-02-11 ENCOUNTER — Encounter: Payer: Self-pay | Admitting: Gastroenterology

## 2023-02-11 NOTE — Progress Notes (Signed)
Patient ID: Gregory [name], male   DOB: [birth year], 72 y.o.   MRN: [id number]  Care Management & Coordination Services Pharmacy Team  Reason for Encounter: Appointment Reminder  Contacted patient to confirm in office appointment with Theo Dills, PharmD on 02/12/23 at 9. Unsuccessful outreach. Left voicemail for patient to return call.    Star Rating Drugs:  Atorvastatin 80 mg - Last filled 11/19/22 90 DS at [facility] Lisinopril - HCTZ 20-12.5 - Last filled 01/22/23 90 DS at [place]: [place] Clinical Pharmacist Assistant [phone]

## 2023-02-12 ENCOUNTER — Other Ambulatory Visit: Payer: Self-pay | Admitting: Family Medicine

## 2023-02-12 ENCOUNTER — Ambulatory Visit: Payer: PPO

## 2023-02-12 MED ORDER — FLUTICASONE-SALMETEROL 500-50 MCG/ACT IN AEPB: 1.0000 | INHALATION_SPRAY | Freq: Two times a day (BID) | RESPIRATORY_TRACT | 3 refills | Status: DC

## 2023-02-23 ENCOUNTER — Ambulatory Visit (INDEPENDENT_AMBULATORY_CARE_PROVIDER_SITE_OTHER): Payer: PPO | Admitting: Medical

## 2023-02-23 VITALS — BP 120/70 | HR 52 | Temp 97.8°F | Wt 139.6 lb

## 2023-02-23 DIAGNOSIS — M79641 Pain in right hand: Secondary | ICD-10-CM | POA: Diagnosis not present

## 2023-02-23 NOTE — Progress Notes (Signed)
Subjective:  Gregory [name] is a 72 y.o. male who presents for Chief Complaint  Patient presents with   right hand pain    Right hand pain x 6 weeks. Getting worse and seems to be going up arm. Pain started when lifting weights     Here for right hand pain x 6 weeks.  Feels a sensitivity between right 4th and 5th fingers close to MCP area.  No injury or trauma, no fall.  Has bee lifting weights.  No swelling, no redness or bruising.  Wife thinks its arthritis.  He cooks and washes dishes, some weight lifting, but doesn't use the 4th and 5th fingers that much on right hand.   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  No other aggravating or relieving factors.    No other c/o.  The following portions of the patient's history were reviewed and updated as appropriate: allergies, current medications, past family history, past medical history, past social history, past surgical history and problem list.  ROS Otherwise as in subjective above  Objective: BP 120/70   Pulse (!) 52   Temp 97.8 F (36.6 C) (Tympanic)   Wt 139 lb 9.6 oz (63.3 kg)   BMI 22.88 kg/m   General appearance: alert, no distress, well developed, well nourished There is a particular spot where he is tender between the right fourth and fifth MCPs.  He seems to be tender along the medial aspect of the fifth MCP.  Possible bone spur.  No other tenderness.  Right finger range of motion normal.  He also has some tenderness along the right posterior lateral forearm but not tender along the lateral epicondyles of the elbow.  No wrist tenderness.  Wrist and arm range of motion otherwise normal Strength of the right hand seems little decreased compared to the left hand in general but otherwise 4-5 out of 5 strength and normal sensation of both hands Pulses and  cap refill normal arms and hands   Assessment: Encounter Diagnosis  Name Primary?   Right hand pain Yes     Plan: Differential could  include arthritis, bone spur, other bony issue or soft tissue abnormality between the right fourth and fifth fingers at the MCP region.  We will send for baseline x-ray.  We discussed buddy tape and splinting, relative rest, over-the-counter Tylenol use.  Gregory [name] was seen today for right hand pain.  Diagnoses and all orders for this visit:  Right hand pain -     DG Hand Complete Right; Future    Follow up: pending xray

## 2023-02-23 NOTE — Patient Instructions (Signed)
Please go to [facility] for your hand xray.   Their hours are 8am - 4:30 pm Monday - Friday.  Take your insurance card with you.  [facility] [phone]  [place] [address], [address] [place], [place] [address]

## 2023-02-24 ENCOUNTER — Ambulatory Visit
Admission: RE | Admit: 2023-02-24 | Discharge: 2023-02-24 | Disposition: A | Discharge status: Home / Self Care | Payer: PPO | Source: Ambulatory Visit | Attending: Medical | Admitting: Medical

## 2023-02-24 ENCOUNTER — Encounter: Payer: Self-pay | Admitting: Certified Registered Nurse Anesthetist

## 2023-02-24 DIAGNOSIS — M19041 Primary osteoarthritis, right hand: Secondary | ICD-10-CM | POA: Diagnosis not present

## 2023-02-24 DIAGNOSIS — M79641 Pain in right hand: Secondary | ICD-10-CM

## 2023-02-25 ENCOUNTER — Encounter: Payer: Self-pay | Admitting: Gastroenterology

## 2023-02-25 ENCOUNTER — Ambulatory Visit (AMBULATORY_SURGERY_CENTER): Payer: PPO | Admitting: Gastroenterology

## 2023-02-25 VITALS — BP 119/54 | HR 53 | Temp 97.7°F | Resp 16 | Ht 65.5 in | Wt 139.0 lb

## 2023-02-25 DIAGNOSIS — Z86010 Personal history of colonic polyps: Secondary | ICD-10-CM | POA: Diagnosis not present

## 2023-02-25 DIAGNOSIS — I2510 Atherosclerotic heart disease of native coronary artery without angina pectoris: Secondary | ICD-10-CM | POA: Diagnosis not present

## 2023-02-25 DIAGNOSIS — D125 Benign neoplasm of sigmoid colon: Secondary | ICD-10-CM | POA: Diagnosis not present

## 2023-02-25 DIAGNOSIS — K635 Polyp of colon: Secondary | ICD-10-CM | POA: Diagnosis not present

## 2023-02-25 DIAGNOSIS — D123 Benign neoplasm of transverse colon: Secondary | ICD-10-CM

## 2023-02-25 DIAGNOSIS — Z09 Encounter for follow-up examination after completed treatment for conditions other than malignant neoplasm: Secondary | ICD-10-CM

## 2023-02-25 MED ORDER — SODIUM CHLORIDE 0.9 % IV SOLN: 500.0000 mL | Freq: Once | INTRAVENOUS | Status: DC

## 2023-02-25 NOTE — Progress Notes (Signed)
Pt's states no medical or surgical changes since previsit or office visit. 

## 2023-02-25 NOTE — Progress Notes (Signed)
Called to room to assist during endoscopic procedure.  Patient ID and intended procedure confirmed with present staff. Received instructions for my participation in the procedure from the performing physician.  

## 2023-02-25 NOTE — Patient Instructions (Signed)
YOU HAD AN ENDOSCOPIC PROCEDURE TODAY AT THE [facility]:   Refer to the procedure report that was given to you for any specific questions about what was found during the examination.  If the procedure report does not answer your questions, please call your gastroenterologist to clarify.  If you requested that your care partner not be given the details of your procedure findings, then the procedure report has been included in a sealed envelope for you to review at your convenience later.  YOU SHOULD EXPECT: Some feelings of bloating in the abdomen. Passage of more gas than usual.  Walking can help get rid of the air that was put into your GI tract during the procedure and reduce the bloating. If you had a lower endoscopy (such as a colonoscopy or flexible sigmoidoscopy) you may notice spotting of blood in your stool or on the toilet paper. If you underwent a bowel prep for your procedure, you may not have a normal bowel movement for a few days.  Please Note:  You might notice some irritation and congestion in your nose or some drainage.  This is from the oxygen used during your procedure.  There is no need for concern and it should clear up in a day or so.  SYMPTOMS TO REPORT IMMEDIATELY:  Following lower endoscopy (colonoscopy or flexible sigmoidoscopy):  Excessive amounts of blood in the stool  Significant tenderness or worsening of abdominal pains  Swelling of the abdomen that is new, acute  Fever of 100F or higher  For urgent or emergent issues, a gastroenterologist can be reached at any hour by calling [phone]. Do not use MyChart messaging for urgent concerns.    DIET:  We do recommend a small meal at first, but then you may proceed to your regular diet.  Drink plenty of fluids but you should avoid alcoholic beverages for 24 hours.  ACTIVITY:  You should plan to take it easy for the rest of today and you should NOT DRIVE or use heavy machinery until tomorrow (because of  the sedation medicines used during the test).    FOLLOW UP: Our staff will call the number listed on your records the next business day following your procedure.  We will call around 7:15- 8:00 am to check on you and address any questions or concerns that you may have regarding the information given to you following your procedure. If we do not reach you, we will leave a message.     If any biopsies were taken you will be contacted by phone or by letter within the next 1-3 weeks.  Please call us at [phone] if you have not heard about the biopsies in 3 weeks.    SIGNATURES/CONFIDENTIALITY: You and/or your care partner have signed paperwork which will be entered into your electronic medical record.  These signatures attest to the fact that that the information above on your After Visit Summary has been reviewed and is understood.  Full responsibility of the confidentiality of this discharge information lies with you and/or your care-partner. 

## 2023-02-25 NOTE — Progress Notes (Signed)
0947 HR < 40 with Robinul 0.2 mg given IV. MD updated, vss

## 2023-02-25 NOTE — Progress Notes (Signed)
History & Physical  Primary Care Physician:  [name], MD Primary Gastroenterologist: Lucio Edward, MD  Impression / Plan:  Personal history of multiple adenomatous and sessile serrated colon polyps for surveillance colonoscopy.  CHIEF COMPLAINT:  Personal history of colon polyps   HPI: Gregory [name] is a 72 y.o. male with a personal history of adenomatous and sessile serrated colon polyps for surveillance colonoscopy.    Past Medical History:  Diagnosis Date   Alcohol abuse    Aortic stenosis    Arthritis    Asthma    exercise indused or pollen-uses inhaler dail yand has rescue inhaler if needed   Bipolar disorder ([place])    CAD (coronary artery disease)    coronary calcifications 08/2013 CTA   Carotid artery occlusion    Cataract    bilateral sx   Chronic back pain    Claudication ([place])    Complication of anesthesia 10/16/2020   "had a panic attack when the mask was put on me"" I do not think I was given anything  before, I need something to calm me down"   Depression    Family history of skin cancer    Heart murmur    as an infant   Hepatitis    h/o 1970,doesn't remember which type,1990 epstain-barr   Hyperlipidemia    Hypertension    Neuromuscular disorder ([place])    Obsessive compulsive disorder    PAD (peripheral artery disease) ([place])    Peripheral neuropathy    Pneumonia    RBBB    RBBB (right bundle branch block with left anterior fascicular block)    Seasonal allergies    Severe aortic stenosis    Sleep apnea    does not wear CPAP   Smoker    Spinal stenosis at L4-L5 level    Tobacco abuse    Tobacco use disorder     Past Surgical History:  Procedure Laterality Date   ANAL FISTULECTOMY  09/25/2011   AORTA - BILATERAL FEMORAL ARTERY BYPASS GRAFT N/A 10/03/2020   Procedure: AORTA BIFEMORAL BYPASS GRAFT USING A HEMASHIELD GOLD BIFURCATED 14 X 99m GRAFT AND INFERIOR MESENTERIC ARTERY REIMPLANTATION;  Surgeon: BSerafina Mitchell MD;  Location:  [facility];  Service: Vascular;  Laterality: N/A;   AORTIC VALVE REPLACEMENT N/A 11/24/2019   Procedure: AORTIC VALVE REPLACEMENT (AVR) using INSPIRIS Resilia 23 MM Bioprosthetic Aortic Valve.;  Surgeon: BGaye Pollack MD;  Location: [facility];  Service: Open Heart Surgery;  Laterality: N/A;   APPLICATION OF WOUND VAC Left 10/17/2020   Procedure: APPLICATION OF WOUND VAC;  Surgeon: BSerafina Mitchell MD;  [facility];  Service: Vascular;  Laterality: Left;   BACK SURGERY     2015   CATARACT EXTRACTION, BILATERAL  2022   COLONOSCOPY  2009   MS-F/V-mov(exc)-HPP   CORONARY ARTERY BYPASS GRAFT N/A 11/24/2019   Procedure: CORONARY ARTERY BYPASS GRAFTING (CABG) using LIMA to LAD.;  Surgeon: BGaye Pollack MD;  [facility];  Service: Open Heart Surgery;  Laterality: N/A;   ELBOW SURGERY     bilaterally for cubital tunnel   ENDARTERECTOMY N/A 10/03/2020   Procedure: AORTIC ENDARTERECTOMY;  Surgeon: BSerafina Mitchell MD;  [facility]: [facility];  Service: Vascular;  Laterality: N/A;   ENDARTERECTOMY FEMORAL Bilateral 10/03/2020   Procedure: BILATERAL FEMORAL ENDARTERECTOMY;  Surgeon: BSerafina Mitchell MD;  [facility]: [facility];  Service: Vascular;  Laterality: Bilateral;   HERNIA REPAIR     with mesh  INCISION AND DRAINAGE Left 01/02/2021   Procedure: INCISION AND DRAINAGE LEFT GROIN WITH STIMULAN BEADS;  Surgeon: Serafina Mitchell, MD;  Location: [facility];  Service: Vascular;  Laterality: Left;   INCISION AND DRAINAGE OF WOUND Left 10/17/2020   Procedure: EXPLORATION LEFT GROIN WOUND;  Surgeon: Serafina Mitchell, MD;  [facility]: [facility];  Service: Vascular;  Laterality: Left;   MAXIMUM ACCESS (MAS)POSTERIOR LUMBAR INTERBODY FUSION (PLIF) 1 LEVEL N/A 10/25/2014   Procedure: LUMBAR FOUR TO FIVE MAXIMUM ACCESS (MAS) POSTERIOR LUMBAR INTERBODY FUSION (PLIF) 1 LEVEL;  Surgeon: Eustace Moore, MD;  Location: [facility];  Service: Neurosurgery;  Laterality: N/A;   RIGHT/LEFT HEART CATH AND CORONARY ANGIOGRAPHY N/A  11/10/2019   Procedure: RIGHT/LEFT HEART CATH AND CORONARY ANGIOGRAPHY;  Surgeon: [name], [name], MD;  Location: [facility];  Service: Cardiovascular;  Laterality: N/A;   SKIN BIOPSY Left 10/12/2018   shave forehead Hypertrophic actinic kertosis with features of a verruca   TEE WITHOUT CARDIOVERSION N/A 11/24/2019   Procedure: TRANSESOPHAGEAL ECHOCARDIOGRAM (TEE);  Surgeon: Gaye Pollack, MD;  Location: [facility];  Service: Open Heart Surgery;  Laterality: N/A;   TONSILLECTOMY  age 58   VASECTOMY     X 2   VASECTOMY REVERSAL      Prior to Admission medications   Medication Sig Start Date End Date Taking? Authorizing Provider  acetaminophen (TYLENOL) 650 MG CR tablet Take 650 mg by mouth every 8 (eight) hours as needed for pain.    [provider]  albuterol (VENTOLIN HFA) 108 (90 Base) MCG/ACT inhaler INHALE TWO PUFFS BY MOUTH EVERY 6 HOURS AS NEEDED FOR WHEEZING 01/08/22   [name], MD  amLODipine (NORVASC) 10 MG tablet Take 1 tablet (10 mg total) by mouth daily. 04/15/22   [name], MD  amoxicillin (AMOXIL) 500 MG capsule Take 4 capsules (2,000 mg total) by mouth once as needed for up to 1 dose. Take 4 tablets one hour before dental work 06/02/22   [name], [name], MD  aspirin EC 81 MG tablet Take 1 tablet (81 mg total) by mouth daily. Swallow whole. 06/02/22   [name], [name], MD  atorvastatin (LIPITOR) 80 MG tablet Take 1 tablet (80 mg total) by mouth daily. 04/15/22   [name], MD  azelastine (ASTELIN) 0.1 % nasal spray Place 1-2 sprays into both nostrils 2 (two) times daily as needed (nasal drainage). Use in each nostril as directed 04/21/22   Garnet Sierras, DO  Dextromethorphan-guaiFENesin 5-100 MG/5ML LIQD Take 20 mLs by mouth every 4 (four) hours. Patient not taking: Reported on 02/03/2023    [provider]  divalproex (DEPAKOTE ER) 500 MG 24 hr tablet Take 500 mg by mouth 3 (three) times daily. 2- 3 tablets once daily 10/05/21   [provider]  Fexofenadine HCl (ALLEGRA ALLERGY PO) Take by mouth.    [provider]  finasteride (PROSCAR) 5 MG tablet Take 1 tablet (5 mg total) by mouth daily. 04/15/22   [name], MD  fluticasone-salmeterol (ADVAIR DISKUS) 500-50 MCG/ACT AEPB Inhale 1 puff into the lungs in the morning and at bedtime. 02/12/23   [name], MD  guaiFENesin 200 MG tablet Take 200 mg by mouth every 4 (four) hours as needed for cough or to loosen phlegm.    [provider]  lamoTRIgine (LAMICTAL) 200 MG tablet Take 1 tablet (200 mg total) by mouth every morning. 01/05/14   [name], MD  lisinopril-hydrochlorothiazide (ZESTORETIC) 20-12.5 MG tablet Take 1 tablet by mouth every morning. 04/15/22   [name], MD  loratadine (CLARITIN) 10 MG tablet Take 1 tablet by mouth daily.    [provider]  Multiple Vitamins-Minerals (MULTIVITAMIN ADULTS 50+ PO) Take 1 tablet by mouth daily.     [provider]  nicotine polacrilex (COMMIT) 4 MG lozenge Take 4 mg by mouth as needed for smoking cessation.    [provider]    Current Outpatient Medications  Medication Sig Dispense Refill   acetaminophen (TYLENOL) 650 MG CR tablet Take 650 mg by mouth every 8 (eight) hours as needed for pain.     albuterol (VENTOLIN HFA) 108 (90 Base) MCG/ACT inhaler INHALE TWO PUFFS BY MOUTH EVERY 6 HOURS AS NEEDED FOR WHEEZING 8.5 g 1   amLODipine (NORVASC) 10 MG tablet Take 1 tablet (10 mg total) by mouth daily. 90 tablet 3   amoxicillin (AMOXIL) 500 MG capsule Take 4 capsules (2,000 mg total) by mouth once as needed for up to 1 dose. Take 4 tablets one hour before dental work 4 capsule 11   aspirin EC 81 MG tablet Take 1 tablet (81 mg total) by mouth daily. Swallow whole. 90 tablet 3   atorvastatin (LIPITOR) 80 MG tablet Take 1 tablet (80 mg total) by mouth daily. 90 tablet 3   azelastine (ASTELIN) 0.1 % nasal spray Place 1-2 sprays into both nostrils 2 (two) times daily  as needed (nasal drainage). Use in each nostril as directed 30 mL 5   Dextromethorphan-guaiFENesin 5-100 MG/5ML LIQD Take 20 mLs by mouth every 4 (four) hours. (Patient not taking: Reported on 02/03/2023)     divalproex (DEPAKOTE ER) 500 MG 24 hr tablet Take 500 mg by mouth 3 (three) times daily. 2- 3 tablets once daily     Fexofenadine HCl (ALLEGRA ALLERGY PO) Take by mouth.     finasteride (PROSCAR) 5 MG tablet Take 1 tablet (5 mg total) by mouth daily. 90 tablet 3   fluticasone-salmeterol (ADVAIR DISKUS) 500-50 MCG/ACT AEPB Inhale 1 puff into the lungs in the morning and at bedtime. 3 each 3   guaiFENesin 200 MG tablet Take 200 mg by mouth every 4 (four) hours as needed for cough or to loosen phlegm.     lamoTRIgine (LAMICTAL) 200 MG tablet Take 1 tablet (200 mg total) by mouth every morning. 90 tablet 3   lisinopril-hydrochlorothiazide (ZESTORETIC) 20-12.5 MG tablet Take 1 tablet by mouth every morning. 90 tablet 2   loratadine (CLARITIN) 10 MG tablet Take 1 tablet by mouth daily.     Multiple Vitamins-Minerals (MULTIVITAMIN ADULTS 50+ PO) Take 1 tablet by mouth daily.      nicotine polacrilex (COMMIT) 4 MG lozenge Take 4 mg by mouth as needed for smoking cessation.     Current Facility-Administered Medications  Medication Dose Route Frequency Provider Last Rate Last Admin   0.9 %  sodium chloride infusion  500 mL Intravenous Once Ladene Artist, MD        Allergies as of 02/25/2023 - Review Complete 02/25/2023  Allergen Reaction Noted   Codeine Anaphylaxis 10/18/2008    Family History  Problem Relation Age of Onset   Colon polyps Mother 13   Heart failure Mother    Stroke Father 22   Cancer Father        skin   Drug abuse Father    Hypertension Brother    Diabetes Brother    Drug abuse Brother  Heart disease Brother        s/p CABG, pacemaker   Cancer Maternal Aunt        skin   Cancer Maternal Grandmother        liver   Colon cancer Neg Hx    Esophageal cancer Neg Hx     Rectal cancer Neg Hx    Stomach cancer Neg Hx     Social History   Socioeconomic History   Marital status: Married    Spouse name: Not on file   Number of children: 6   Years of education: Not on file   Highest education level: Not on file  Occupational History   Not on file  Tobacco Use   Smoking status: Every Day    Packs/day: 0.50    Years: 25.00    Total pack years: 12.50    Types: Cigarettes   Smokeless tobacco: Never   Tobacco comments:    Pt had stopped smoking X10 years/ currently smoking a pack a day   Vaping Use   Vaping Use: Never used  Substance and Sexual Activity   Alcohol use: Not Currently    Comment: 06/26/2014- last drink- AA   Drug use: No    Comment: former alcoholic   Sexual activity: Yes  Other Topics Concern   Not on file  Social History Narrative   Lives in [place] with wife.  Does not routinely exercise.  Sedentary r/t chronic lbp.   Social Determinants of Health   Financial Resource Strain: Low Risk  (02/12/2023)   Overall Financial Resource Strain (CARDIA)    Difficulty of Paying Living Expenses: Not very hard  Food Insecurity: No Food Insecurity (02/12/2023)   Hunger Vital Sign    Worried About Running Out of Food in the Last Year: Never true    Ran Out of Food in the Last Year: Never true  Transportation Needs: No Transportation Needs (02/12/2023)   PRAPARE - Hydrologist (Medical): No    Lack of Transportation (Non-Medical): No  Physical Activity: Sufficiently Active (02/12/2023)   Exercise Vital Sign    Days of Exercise per Week: 5 days    Minutes of Exercise per Session: 60 min  Stress: No Stress Concern Present (04/11/2022)   [place]    Feeling of Stress : Only a little  Social Connections: Not on file  Intimate Partner Violence: Not on file    Review of Systems:  All systems reviewed were negative except where noted in  HPI.   Physical Exam: General:  Alert, well-developed, in NAD Head:  Normocephalic and atraumatic. Eyes:  Sclera clear, no icterus.   Conjunctiva pink. Ears:  Normal auditory acuity. Mouth:  No deformity or lesions.  Neck:  Supple; no masses. Lungs:  Clear throughout to auscultation.   No wheezes, crackles, or rhonchi.  Heart:  Regular rate and rhythm; no murmurs. Abdomen:  Soft, nondistended, nontender. No masses, hepatomegaly. No palpable masses.  Normal bowel sounds.    Rectal:  Deferred   Msk:  Symmetrical without gross deformities. Extremities:  Without edema. Neurologic:  Alert and  oriented x 4; grossly normal neurologically. Skin:  Intact without significant lesions or rashes. Psych:  Alert and cooperative. Normal mood and affect.   Pricilla Riffle. Fuller Plan  02/25/2023, 9:31 AM See Shea Evans, [facility], to contact our on call provider

## 2023-02-25 NOTE — Op Note (Signed)
[facility] Patient Name: Gregory [name] Procedure Date: 02/25/2023 9:07 AM MRN: [id number] Endoscopist: Ladene Artist , MD, [id number] Age: 72 Referring MD:  Date of Birth: [birth year] Gender: Male Account #: [id number] Procedure:                Colonoscopy Indications:              Surveillance: History of numerous (> 10) adenomas                            and sessile serrated polyps on last colonoscopy (<                            3 yrs) Medicines:                Monitored Anesthesia Care Procedure:                Pre-Anesthesia Assessment:                           - Prior to the procedure, a History and Physical                            was performed, and patient medications and                            allergies were reviewed. The patient's tolerance of                            previous anesthesia was also reviewed. The risks                            and benefits of the procedure and the sedation                            options and risks were discussed with the patient.                            All questions were answered, and informed consent                            was obtained. Prior Anticoagulants: The patient has                            taken no anticoagulant or antiplatelet agents. ASA                            Grade Assessment: III - A patient with severe                            systemic disease. After reviewing the risks and                            benefits, the patient was deemed in satisfactory  condition to undergo the procedure.                           After obtaining informed consent, the colonoscope                            was passed under direct vision. Throughout the                            procedure, the patient's blood pressure, pulse, and                            oxygen saturations were monitored continuously. The                            Olympus CF-HQ190L [id number]) was changed to a                             pediatric colonoscope (238) was introduced through                            the anus and advanced to the the cecum, identified                            by appendiceal orifice and ileocecal valve. Unable                            to easily advance the adult colonoscope around the                            sigmoid colon. The ileocecal valve, appendiceal                            orifice, and rectum were photographed. The quality                            of the bowel preparation was adequate after                            extensive lavage and suction. The colonoscopy was                            performed without difficulty. The patient tolerated                            the procedure well. Scope In: 9:46:40 AM Scope Out: 10:14:15 AM Scope Withdrawal Time: 0 hours 18 minutes 22 seconds  Total Procedure Duration: 0 hours 27 minutes 35 seconds  Findings:                 The perianal and digital rectal examinations were                            normal.  Seven sessile polyps were found in the sigmoid                            colon (4), transverse colon (2) and hepatic flexure                            (1). The polyps were 5 to 7 mm in size. These                            polyps were removed with a cold snare. Resection                            and retrieval were complete.                           A few small-mouthed diverticula were found in the                            left colon.                           External hemorrhoids were found during                            retroflexion. The hemorrhoids were small.                           The exam was otherwise without abnormality on                            direct and retroflexion views. Complications:            No immediate complications. Estimated blood loss:                            None. Estimated Blood Loss:     Estimated blood loss: none. Impression:                - Seven 5 to 7 mm polyps in the sigmoid colon, in                            the transverse colon and at the hepatic flexure,                            removed with a cold snare. Resected and retrieved.                           - Mild diverticulosis in the left colon.                           - External hemorrhoids.                           - The examination was otherwise normal on direct  and retroflexion views. Recommendation:           - Repeat colonoscopy, likely 2-3 years, after                            studies are complete for surveillance based on                            pathology results with a more extensive bowel prep.                           - Patient has a contact number available for                            emergencies. The signs and symptoms of potential                            delayed complications were discussed with the                            patient. Return to normal activities tomorrow.                            Written discharge instructions were provided to the                            patient.                           - High fiber diet.                           - Continue present medications.                           - Await pathology results. Ladene Artist, MD 02/25/2023 10:21:57 AM This report has been signed electronically.

## 2023-02-25 NOTE — Progress Notes (Signed)
Results sent through MyChart

## 2023-02-25 NOTE — Progress Notes (Signed)
Report given to PACU, vss 

## 2023-02-26 ENCOUNTER — Telehealth: Payer: Self-pay

## 2023-02-26 NOTE — Telephone Encounter (Signed)
  Follow up Call-     02/25/2023    9:14 AM 01/07/2022    1:22 PM  Call back number  Post procedure Call Back phone  # [phone]  Permission to leave phone message Yes Yes     Patient questions:  Do you have a fever, pain , or abdominal swelling? No. Pain Score  0 *  Have you tolerated food without any problems? Yes.    Have you been able to return to your normal activities? Yes.    Do you have any questions about your discharge instructions: Diet   No. Medications  No. Follow up visit  No.  Do you have questions or concerns about your Care? No.  Actions: * If pain score is 4 or above: No action needed, pain <4.

## 2023-03-10 ENCOUNTER — Encounter: Payer: Self-pay | Admitting: Gastroenterology

## 2023-04-05 ENCOUNTER — Other Ambulatory Visit: Payer: Self-pay | Admitting: Family Medicine

## 2023-04-06 NOTE — Telephone Encounter (Signed)
Is this okay to refill? 

## 2023-04-06 NOTE — Telephone Encounter (Signed)
Refill request last apt 02/23/23.

## 2023-04-14 ENCOUNTER — Ambulatory Visit (INDEPENDENT_AMBULATORY_CARE_PROVIDER_SITE_OTHER): Payer: PPO

## 2023-04-14 VITALS — BP 128/60 | HR 52 | Temp 97.9°F | Ht 65.5 in | Wt 140.5 lb

## 2023-04-14 DIAGNOSIS — Z0000 Encounter for general adult medical examination without abnormal findings: Secondary | ICD-10-CM | POA: Diagnosis not present

## 2023-04-14 NOTE — Patient Instructions (Signed)
Gregory [name] , Thank you for taking time to come for your Medicare Wellness Visit. I appreciate your ongoing commitment to your health goals. Please review the following plan we discussed and let me know if I can assist you in the future.   These are the goals we discussed:  Goals      Patient Stated     04/11/2022, quit smoking     Patient Stated     04/14/2023, quit smoking and build up strength        This is a list of the screening recommended for you and due dates:  Health Maintenance  Topic Date Due   DTaP/Tdap/Td vaccine (2 - Td or Tdap) 10/29/2022   COVID-19 Vaccine (6 - 2023-24 season) 11/25/2022   Flu Shot  07/23/2023   Medicare Annual Wellness Visit  04/13/2024   Cologuard (Stool DNA test)  10/21/2024   Hepatitis C Screening: USPSTF Recommendation to screen - Ages 18-79 yo.  Completed   Zoster (Shingles) Vaccine  Completed   HPV Vaccine  Aged Out   Pneumonia Vaccine  Discontinued   Colon Cancer Screening  Discontinued    Advanced directives: copy in chart  Conditions/risks identified: none  Next appointment: Follow up in one year for your annual wellness visit.   Preventive Care 70 Years and Older, Male  Preventive care refers to lifestyle choices and visits with your health care provider that can promote health and wellness. What does preventive care include? A yearly physical exam. This is also called an annual well check. Dental exams once or twice a year. Routine eye exams. Ask your health care provider how often you should have your eyes checked. Personal lifestyle choices, including: Daily care of your teeth and gums. Regular physical activity. Eating a healthy diet. Avoiding tobacco and drug use. Limiting alcohol use. Practicing safe sex. Taking low doses of aspirin every day. Taking vitamin and mineral supplements as recommended by your health care provider. What happens during an annual well check? The services and screenings done by your health  care provider during your annual well check will depend on your age, overall health, lifestyle risk factors, and family history of disease. Counseling  Your health care provider may ask you questions about your: Alcohol use. Tobacco use. Drug use. Emotional well-being. Home and relationship well-being. Sexual activity. Eating habits. History of falls. Memory and ability to understand (cognition). Work and work Astronomer. Screening  You may have the following tests or measurements: Height, weight, and BMI. Blood pressure. Lipid and cholesterol levels. These may be checked every 5 years, or more frequently if you are over 25 years old. Skin check. Lung cancer screening. You may have this screening every year starting at age 51 if you have a 30-pack-year history of smoking and currently smoke or have quit within the past 15 years. Fecal occult blood test (FOBT) of the stool. You may have this test every year starting at age 9. Flexible sigmoidoscopy or colonoscopy. You may have a sigmoidoscopy every 5 years or a colonoscopy every 10 years starting at age 85. Prostate cancer screening. Recommendations will vary depending on your family history and other risks. Hepatitis C blood test. Hepatitis B blood test. Sexually transmitted disease (STD) testing. Diabetes screening. This is done by checking your blood sugar (glucose) after you have not eaten for a while (fasting). You may have this done every 1-3 years. Abdominal aortic aneurysm (AAA) screening. You may need this if you are a current or former smoker.  Osteoporosis. You may be screened starting at age 44 if you are at high risk. Talk with your health care provider about your test results, treatment options, and if necessary, the need for more tests. Vaccines  Your health care provider may recommend certain vaccines, such as: Influenza vaccine. This is recommended every year. Tetanus, diphtheria, and acellular pertussis (Tdap, Td)  vaccine. You may need a Td booster every 10 years. Zoster vaccine. You may need this after age 65. Pneumococcal 13-valent conjugate (PCV13) vaccine. One dose is recommended after age 16. Pneumococcal polysaccharide (PPSV23) vaccine. One dose is recommended after age 69. Talk to your health care provider about which screenings and vaccines you need and how often you need them. This information is not intended to replace advice given to you by your health care provider. Make sure you discuss any questions you have with your health care provider. Document Released: 01/04/2016 Document Revised: 08/27/2016 Document Reviewed: 10/09/2015 Elsevier Interactive Patient Education  2017 [place] Prevention in the Home Falls can cause injuries. They can happen to people of all ages. There are many things you can do to make your home safe and to help prevent falls. What can I do on the outside of my home? Regularly fix the edges of walkways and driveways and fix any cracks. Remove anything that might make you trip as you walk through a door, such as a raised step or threshold. Trim any bushes or trees on the path to your home. Use bright outdoor lighting. Clear any walking paths of anything that might make someone trip, such as rocks or tools. Regularly check to see if handrails are loose or broken. Make sure that both sides of any steps have handrails. Any raised decks and porches should have guardrails on the edges. Have any leaves, snow, or ice cleared regularly. Use sand or salt on walking paths during winter. Clean up any spills in your garage right away. This includes oil or grease spills. What can I do in the bathroom? Use night lights. Install grab bars by the toilet and in the tub and shower. Do not use towel bars as grab bars. Use non-skid mats or decals in the tub or shower. If you need to sit down in the shower, use a plastic, non-slip stool. Keep the floor dry. Clean up any  water that spills on the floor as soon as it happens. Remove soap buildup in the tub or shower regularly. Attach bath mats securely with double-sided non-slip rug tape. Do not have throw rugs and other things on the floor that can make you trip. What can I do in the bedroom? Use night lights. Make sure that you have a light by your bed that is easy to reach. Do not use any sheets or blankets that are too big for your bed. They should not hang down onto the floor. Have a firm chair that has side arms. You can use this for support while you get dressed. Do not have throw rugs and other things on the floor that can make you trip. What can I do in the kitchen? Clean up any spills right away. Avoid walking on wet floors. Keep items that you use a lot in easy-to-reach places. If you need to reach something above you, use a strong step stool that has a grab bar. Keep electrical cords out of the way. Do not use floor polish or wax that makes floors slippery. If you must use wax, use non-skid floor  wax. Do not have throw rugs and other things on the floor that can make you trip. What can I do with my stairs? Do not leave any items on the stairs. Make sure that there are handrails on both sides of the stairs and use them. Fix handrails that are broken or loose. Make sure that handrails are as long as the stairways. Check any carpeting to make sure that it is firmly attached to the stairs. Fix any carpet that is loose or worn. Avoid having throw rugs at the top or bottom of the stairs. If you do have throw rugs, attach them to the floor with carpet tape. Make sure that you have a light switch at the top of the stairs and the bottom of the stairs. If you do not have them, ask someone to add them for you. What else can I do to help prevent falls? Wear shoes that: Do not have high heels. Have rubber bottoms. Are comfortable and fit you well. Are closed at the toe. Do not wear sandals. If you use a  stepladder: Make sure that it is fully opened. Do not climb a closed stepladder. Make sure that both sides of the stepladder are locked into place. Ask someone to hold it for you, if possible. Clearly mark and make sure that you can see: Any grab bars or handrails. First and last steps. Where the edge of each step is. Use tools that help you move around (mobility aids) if they are needed. These include: Canes. Walkers. Scooters. Crutches. Turn on the lights when you go into a dark area. Replace any light bulbs as soon as they burn out. Set up your furniture so you have a clear path. Avoid moving your furniture around. If any of your floors are uneven, fix them. If there are any pets around you, be aware of where they are. Review your medicines with your doctor. Some medicines can make you feel dizzy. This can increase your chance of falling. Ask your doctor what other things that you can do to help prevent falls. This information is not intended to replace advice given to you by your health care provider. Make sure you discuss any questions you have with your health care provider. Document Released: 10/04/2009 Document Revised: 05/15/2016 Document Reviewed: 01/12/2015 Elsevier Interactive Patient Education  2017 [employer].

## 2023-04-14 NOTE — Progress Notes (Addendum)
Subjective:   Gregory [name] is a 72 y.o. male who presents for Medicare Annual/Subsequent preventive examination.  Patient Medicare AWV questionnaire was completed by the patient on 04/10/2023; I have confirmed that all information answered by patient is correct and no changes since this date.     Review of Systems     Cardiac Risk Factors include: advanced age (>24men, >33 women);dyslipidemia;hypertension;male gender     Objective:    Today's Vitals   04/14/23 0835  BP: 128/60  Pulse: (!) 52  Temp: 97.9 F (36.6 C)  TempSrc: Oral  SpO2: 99%  Weight: 140 lb 8 oz (63.7 kg)  Height: 5' 5.5" (1.664 m)   Body mass index is 23.02 kg/m.     04/14/2023    8:51 AM 04/11/2022    8:50 AM 04/03/2021    1:43 PM 01/02/2021   11:11 AM 10/17/2020    8:38 AM 10/11/2020    8:00 AM 09/26/2020   10:31 AM  Advanced Directives  Does Patient Have a Medical Advance Directive? Yes Yes Yes Yes Yes Yes Yes  Type of Estate agent of [place];Living will Healthcare Power of [place];Living will Living will Healthcare Power of Attorney Living will;Healthcare Power of [employer] Power of [place];Living will Healthcare Power of [place];Living will  Does patient want to make changes to medical advance directive?   No - Patient declined  No - Patient declined No - Patient declined No - Patient declined  Copy of Healthcare Power of Attorney in Chart? Yes - validated most recent copy scanned in chart (See row information) Yes - validated most recent copy scanned in chart (See row information)   Yes - validated most recent copy scanned in chart (See row information) Yes - validated most recent copy scanned in chart (See row information) Yes - validated most recent copy scanned in chart (See row information)    Current Medications (verified) Outpatient Encounter Medications as of 04/14/2023  Medication Sig   acetaminophen (TYLENOL) 650 MG CR tablet Take 650 mg by mouth  every 8 (eight) hours as needed for pain.   albuterol (VENTOLIN HFA) 108 (90 Base) MCG/ACT inhaler INHALE TWO PUFFS BY MOUTH EVERY 6 HOURS AS NEEDED FOR WHEEZING   amLODipine (NORVASC) 10 MG tablet Take 1 tablet (10 mg total) by mouth daily.   amoxicillin (AMOXIL) 500 MG capsule Take 4 capsules (2,000 mg total) by mouth once as needed for up to 1 dose. Take 4 tablets one hour before dental work   aspirin EC 81 MG tablet Take 1 tablet (81 mg total) by mouth daily. Swallow whole.   atorvastatin (LIPITOR) 80 MG tablet Take 1 tablet (80 mg total) by mouth daily.   azelastine (ASTELIN) 0.1 % nasal spray Place 1-2 sprays into both nostrils 2 (two) times daily as needed (nasal drainage). Use in each nostril as directed   Dextromethorphan-guaiFENesin 5-100 MG/5ML LIQD Take 20 mLs by mouth every 4 (four) hours.   divalproex (DEPAKOTE ER) 500 MG 24 hr tablet Take 500 mg by mouth 3 (three) times daily. 2- 3 tablets once daily   Fexofenadine HCl (ALLEGRA ALLERGY PO) Take by mouth.   finasteride (PROSCAR) 5 MG tablet Take 1 tablet (5 mg total) by mouth daily.   fluticasone-salmeterol (ADVAIR DISKUS) 500-50 MCG/ACT AEPB Inhale 1 puff into the lungs in the morning and at bedtime.   guaiFENesin 200 MG tablet Take 200 mg by mouth every 4 (four) hours as needed for cough or to loosen phlegm.  lamoTRIgine (LAMICTAL) 200 MG tablet Take 1 tablet (200 mg total) by mouth every morning.   lisinopril-hydrochlorothiazide (ZESTORETIC) 20-12.5 MG tablet Take 1 tablet by mouth every morning.   loratadine (CLARITIN) 10 MG tablet Take 1 tablet by mouth daily.   Multiple Vitamins-Minerals (MULTIVITAMIN ADULTS 50+ PO) Take 1 tablet by mouth daily.    nicotine polacrilex (COMMIT) 4 MG lozenge Take 4 mg by mouth as needed for smoking cessation.   No facility-administered encounter medications on file as of 04/14/2023.    Allergies (verified) Codeine   History: Past Medical History:  Diagnosis Date   Alcohol abuse     Aortic stenosis    Arthritis    Asthma    exercise indused or pollen-uses inhaler dail yand has rescue inhaler if needed   Bipolar disorder    CAD (coronary artery disease)    coronary calcifications 08/2013 CTA   Carotid artery occlusion    Cataract    bilateral sx   Chronic back pain    Claudication    Complication of anesthesia 10/16/2020   "had a panic attack when the mask was put on me"" I do not think I was given anything  before, I need something to calm me down"   Depression    Family history of skin cancer    Heart murmur    as an infant   Hepatitis    h/o 1970,doesn't remember which type,1990 epstain-barr   Hyperlipidemia    Hypertension    Neuromuscular disorder    Obsessive compulsive disorder    PAD (peripheral artery disease)    Peripheral neuropathy    Pneumonia    RBBB    RBBB (right bundle branch block with left anterior fascicular block)    Seasonal allergies    Severe aortic stenosis    Sleep apnea    does not wear CPAP   Smoker    Spinal stenosis at L4-L5 level    Tobacco abuse    Tobacco use disorder    Past Surgical History:  Procedure Laterality Date   ANAL FISTULECTOMY  09/25/2011   AORTA - BILATERAL FEMORAL ARTERY BYPASS GRAFT N/A 10/03/2020   Procedure: AORTA BIFEMORAL BYPASS GRAFT USING A HEMASHIELD GOLD BIFURCATED 14 X 7mm GRAFT AND INFERIOR MESENTERIC ARTERY REIMPLANTATION;  Surgeon: Nada Libman, MD;  Location: [facility] OR;  Service: Vascular;  Laterality: N/A;   AORTIC VALVE REPLACEMENT N/A 11/24/2019   Procedure: AORTIC VALVE REPLACEMENT (AVR) using INSPIRIS Resilia 23 MM Bioprosthetic Aortic Valve.;  Surgeon: Alleen Borne, MD;  Location: [facility] OR;  Service: Open Heart Surgery;  Laterality: N/A;   APPLICATION OF WOUND VAC Left 10/17/2020   Procedure: APPLICATION OF WOUND VAC;  Surgeon: Nada Libman, MD;  Location: [facility] OR;  Service: Vascular;  Laterality: Left;   BACK SURGERY     2015   CATARACT EXTRACTION, BILATERAL  2022   COLONOSCOPY   2009   MS-F/V-mov(exc)-HPP   CORONARY ARTERY BYPASS GRAFT N/A 11/24/2019   Procedure: CORONARY ARTERY BYPASS GRAFTING (CABG) using LIMA to LAD.;  Surgeon: Alleen Borne, MD;  Location: [facility];  Service: Open Heart Surgery;  Laterality: N/A;   ELBOW SURGERY     bilaterally for cubital tunnel   ENDARTERECTOMY N/A 10/03/2020   Procedure: AORTIC ENDARTERECTOMY;  Surgeon: Nada Libman, MD;  Location: [facility];  Service: Vascular;  Laterality: N/A;   ENDARTERECTOMY FEMORAL Bilateral 10/03/2020   Procedure: BILATERAL FEMORAL ENDARTERECTOMY;  Surgeon: Nada Libman, MD;  Location: [facility];  Service: Vascular;  Laterality: Bilateral;   HERNIA REPAIR     with mesh   INCISION AND DRAINAGE Left 01/02/2021   Procedure: INCISION AND DRAINAGE LEFT GROIN WITH STIMULAN BEADS;  Surgeon: Nada Libman, MD;  Location: [facility];  Service: Vascular;  Laterality: Left;   INCISION AND DRAINAGE OF WOUND Left 10/17/2020   Procedure: EXPLORATION LEFT GROIN WOUND;  Surgeon: Nada Libman, MD;  Location: [facility];  Service: Vascular;  Laterality: Left;   MAXIMUM ACCESS (MAS)POSTERIOR LUMBAR INTERBODY FUSION (PLIF) 1 LEVEL N/A 10/25/2014   Procedure: LUMBAR FOUR TO FIVE MAXIMUM ACCESS (MAS) POSTERIOR LUMBAR INTERBODY FUSION (PLIF) 1 LEVEL;  Surgeon: Tia Alert, MD;  Location: [facility]O [facility];  Service: Neurosurgery;  Laterality: N/A;   RIGHT/LEFT HEART CATH AND CORONARY ANGIOGRAPHY N/A 11/10/2019   Procedure: RIGHT/LEFT HEART CATH AND CORONARY ANGIOGRAPHY;  Surgeon: Swaziland, [name], MD;  Location: [facility] INVASIVE CV LAB;  Service: Cardiovascular;  Laterality: N/A;   SKIN BIOPSY Left 10/12/2018   shave forehead Hypertrophic actinic kertosis with features of a verruca   TEE WITHOUT CARDIOVERSION N/A 11/24/2019   Procedure: TRANSESOPHAGEAL ECHOCARDIOGRAM (TEE);  Surgeon: Alleen Borne, MD;  Location: [facility] OR;  Service: Open Heart Surgery;  Laterality: N/A;   TONSILLECTOMY  age 12   VASECTOMY     X 2   VASECTOMY REVERSAL      Family History  Problem Relation Age of Onset   Colon polyps Mother 9   Heart failure Mother    Stroke Father 55   Cancer Father        skin   Drug abuse Father    Hypertension Brother    Diabetes Brother    Drug abuse Brother    Heart disease Brother        s/p CABG, pacemaker   Cancer Maternal Aunt        skin   Cancer Maternal Grandmother        liver   Colon cancer Neg Hx    Esophageal cancer Neg Hx    Rectal cancer Neg Hx    Stomach cancer Neg Hx    Social History   Socioeconomic History   Marital status: Married    Spouse name: Not on file   Number of children: 6   Years of education: Not on file   Highest education level: Not on file  Occupational History   Not on file  Tobacco Use   Smoking status: Every Day    Packs/day: 0.50    Years: 25.00    Additional pack years: 0.00    Total pack years: 12.50    Types: Cigarettes   Smokeless tobacco: Never   Tobacco comments:    Pt had stopped smoking X10 years/ currently smoking a pack a day   Vaping Use   Vaping Use: Never used  Substance and Sexual Activity   Alcohol use: Not Currently    Comment: 06/26/2014- last drink- AA   Drug use: No    Comment: former alcoholic   Sexual activity: Yes  Other Topics Concern   Not on file  Social History Narrative   Lives in [place] with wife.  Does not routinely exercise.  Sedentary r/t chronic lbp.   Social Determinants of Health   Financial Resource Strain: Low Risk  (04/10/2023)   Overall Financial Resource Strain (CARDIA)    Difficulty of Paying Living Expenses: Not very hard  Food Insecurity: No Food Insecurity (04/10/2023)   Hunger Vital Sign  Worried About Programme researcher, broadcasting/film/video in the Last Year: Never true    Ran Out of Food in the Last Year: Never true  Transportation Needs: No Transportation Needs (04/10/2023)   PRAPARE - Administrator, Civil Service (Medical): No    Lack of Transportation (Non-Medical): No  Physical Activity: Sufficiently  Active (04/10/2023)   Exercise Vital Sign    Days of Exercise per Week: 4 days    Minutes of Exercise per Session: 150+ min  Stress: Stress Concern Present (04/10/2023)   [employer] of Occupational Health - Occupational Stress Questionnaire    Feeling of Stress : To some extent  Social Connections: Unknown (04/10/2023)   Social Connection and Isolation Panel [NHANES]    Frequency of Communication with Friends and Family: Once a week    Frequency of Social Gatherings with Friends and Family: Never    Attends Religious Services: Not on Marketing executive or Organizations: Yes    Attends Engineer, structural: More than 4 times per year    Marital Status: Married    Tobacco Counseling Ready to quit: Yes Counseling given: Not Answered Tobacco comments: Pt had stopped smoking X10 years/ currently smoking a pack a day    Clinical Intake:  Pre-visit preparation completed: Yes  Pain : No/denies pain     Nutritional Status: BMI of 19-24  Normal Nutritional Risks: None Diabetes: No  How often do you need to have someone help you when you read instructions, pamphlets, or other written materials from your doctor or pharmacy?: 1 - Never  Diabetic? no  Interpreter Needed?: No  Information entered by :: NAllen LPN   Activities of Daily Living    04/10/2023    9:04 AM  In your present state of health, do you have any difficulty performing the following activities:  Hearing? 0  Vision? 0  Difficulty concentrating or making decisions? 0  Walking or climbing stairs? 0  Dressing or bathing? 0  Doing errands, shopping? 0  Preparing Food and eating ? N  Using the Toilet? N  In the past six months, have you accidently leaked urine? N  Do you have problems with loss of bowel control? N  Managing your Medications? N  Managing your Finances? N  Housekeeping or managing your Housekeeping? N    Patient Care Team: Ronnald Nian, MD as PCP - General (Family  Medicine) [place], [name], MD as PCP - Cardiology (Cardiology) Leitha Bleak Milas Kocher, [facility] (Pharmacist)  Indicate any recent Medical Services you may have received from other than [facility] providers in the past year (date may be approximate).     Assessment:   This is a routine wellness examination for Arsh.  Hearing/Vision screen Vision Screening - Comments:: Regular eye exams, Dr. Elmer Picker, Dr. Ashley Royalty  Dietary issues and exercise activities discussed: Current Exercise Habits: Home exercise routine, Type of exercise: walking, Time (Minutes): > 60, Frequency (Times/Week): 4, Weekly Exercise (Minutes/Week): 0   Goals Addressed             This Visit's Progress    Patient Stated       04/14/2023, quit smoking and build up strength       Depression Screen    04/14/2023    8:52 AM 04/11/2022    8:53 AM 04/03/2021    1:45 PM 03/22/2020   10:31 AM 01/10/2020    8:40 AM 09/29/2019    2:05 PM 07/12/2018   10:01 AM  PHQ 2/9 Scores  PHQ - 2 Score 0 0 0 0 0 1 0  PHQ- 9 Score 0 0         Fall Risk    04/10/2023    9:04 AM 04/11/2022    8:52 AM 04/10/2022   11:24 PM 04/03/2021    1:44 PM 03/21/2021    1:51 PM  Fall Risk   Falls in the past year? 0 1 1 0 0  Comment  fell down the stairs     Number falls in past yr: 0 1 1 0 0  Injury with Fall? 0 0 0 0 0  Risk for fall due to : Medication side effect Impaired vision;Medication side effect  No Fall Risks No Fall Risks  Follow up Falls prevention discussed;Education provided;Falls evaluation completed Falls evaluation completed;Education provided;Falls prevention discussed  Falls evaluation completed Falls evaluation completed    FALL RISK PREVENTION PERTAINING TO THE HOME:  Any stairs in or around the home? Yes  If so, are there any without handrails? No  Home free of loose throw rugs in walkways, pet beds, electrical cords, etc? Yes  Adequate lighting in your home to reduce risk of falls? Yes   ASSISTIVE DEVICES UTILIZED TO PREVENT  FALLS:  Life alert? No  Use of a cane, walker or w/c? No  Grab bars in the bathroom? No  Shower chair or bench in shower? No  Elevated toilet seat or a handicapped toilet? Yes   TIMED UP AND GO:  Was the test performed? Yes .  Length of time to ambulate 10 feet: 5 sec.   Gait steady and fast without use of assistive device  Cognitive Function:        04/14/2023    8:54 AM 04/11/2022    8:55 AM  6CIT Screen  What Year? 0 points 0 points  What month? 0 points 0 points  What time? 0 points 0 points  Count back from 20 0 points 0 points  Months in reverse 0 points 0 points  Repeat phrase 0 points 6 points  Total Score 0 points 6 points    Immunizations Immunization History  Administered Date(s) Administered   COVID-19, mRNA, vaccine(Comirnaty)12 years and older 09/30/2022   Fluad Quad(high Dose 65+) 10/07/2021, 09/30/2022   Influenza Split 09/28/2012, 10/19/2014, 09/17/2015   Influenza Whole 10/08/2010   Influenza, High Dose Seasonal PF 10/22/2017, 10/12/2018, 08/24/2019   Influenza-Unspecified 09/21/2013, 10/09/2016, 10/22/2017   PFIZER(Purple Top)SARS-COV-2 Vaccination 01/30/2020, 02/24/2020, 11/11/2020   Pfizer Covid-19 Vaccine Bivalent Booster 21yrs & up 10/07/2021   Pneumococcal Conjugate-13 01/31/2016   Pneumococcal Polysaccharide-23 08/30/2013   Respiratory Syncytial Virus Vaccine,Recomb Aduvanted(Arexvy) 09/09/2022   Tdap 10/29/2012   Zoster Recombinat (Shingrix) 04/23/2017, 07/19/2017    TDAP status: Due, Education has been provided regarding the importance of this vaccine. Advised may receive this vaccine at local pharmacy or Health Dept. Aware to provide a copy of the vaccination record if obtained from local pharmacy or Health Dept. Verbalized acceptance and understanding.  Flu Vaccine status: Up to date  Pneumococcal vaccine status: Up to date  Covid-19 vaccine status: Completed vaccines  Qualifies for Shingles Vaccine? Yes   Zostavax completed Yes    Shingrix Completed?: Yes  Screening Tests Health Maintenance  Topic Date Due   DTaP/Tdap/Td (2 - Td or Tdap) 10/29/2022   COVID-19 Vaccine (6 - 2023-24 season) 11/25/2022   INFLUENZA VACCINE  07/23/2023   Medicare Annual Wellness (AWV)  04/13/2024   Fecal DNA (Cologuard)  10/21/2024  Hepatitis C Screening  Completed   Zoster Vaccines- Shingrix  Completed   HPV VACCINES  Aged Out   Pneumonia Vaccine 33+ Years old  Discontinued   COLONOSCOPY (Pts 45-55yrs Insurance coverage will need to be confirmed)  Discontinued    Health Maintenance  Health Maintenance Due  Topic Date Due   DTaP/Tdap/Td (2 - Td or Tdap) 10/29/2022   COVID-19 Vaccine (6 - 2023-24 season) 11/25/2022    Colorectal cancer screening: Type of screening: Colonoscopy. Completed 02/25/2023. Repeat every 3 years  Lung Cancer Screening: (Low Dose CT Chest recommended if Age 43-80 years, 30 pack-year currently smoking OR have quit w/in 15years.) does not qualify.   Lung Cancer Screening Referral: no  Additional Screening:  Hepatitis C Screening: does qualify; Completed 01/31/2016  Vision Screening: Recommended annual ophthalmology exams for early detection of glaucoma and other disorders of the eye. Is the patient up to date with their annual eye exam?  Yes  Who is the provider or what is the name of the office in which the patient attends annual eye exams? [facility] If pt is not established with a provider, would they like to be referred to a provider to establish care? No .   Dental Screening: Recommended annual dental exams for proper oral hygiene  Community Resource Referral / Chronic Care Management: CRR required this visit?  No   CCM required this visit?  No      Plan:     I have personally reviewed and noted the following in the patient's chart:   Medical and social history Use of alcohol, tobacco or illicit drugs  Current medications and supplements including opioid prescriptions. Patient is not  currently taking opioid prescriptions. Functional ability and status Nutritional status Physical activity Advanced directives List of other physicians Hospitalizations, surgeries, and ER visits in previous 12 months Vitals Screenings to include cognitive, depression, and falls Referrals and appointments  In addition, I have reviewed and discussed with patient certain preventive protocols, quality metrics, and best practice recommendations. A written personalized care plan for preventive services as well as general preventive health recommendations were provided to patient.     Barb Merino, LPN   1/61/0960   Nurse Notes: none

## 2023-04-16 ENCOUNTER — Ambulatory Visit (INDEPENDENT_AMBULATORY_CARE_PROVIDER_SITE_OTHER): Payer: PPO | Admitting: Family Medicine

## 2023-04-16 ENCOUNTER — Encounter: Payer: Self-pay | Admitting: Family Medicine

## 2023-04-16 ENCOUNTER — Telehealth: Payer: Self-pay | Admitting: Family Medicine

## 2023-04-16 VITALS — BP 138/60 | HR 59 | Temp 97.9°F | Resp 16 | Wt 138.4 lb

## 2023-04-16 DIAGNOSIS — I25810 Atherosclerosis of coronary artery bypass graft(s) without angina pectoris: Secondary | ICD-10-CM

## 2023-04-16 DIAGNOSIS — Z0000 Encounter for general adult medical examination without abnormal findings: Secondary | ICD-10-CM

## 2023-04-16 DIAGNOSIS — Z953 Presence of xenogenic heart valve: Secondary | ICD-10-CM | POA: Diagnosis not present

## 2023-04-16 DIAGNOSIS — F17200 Nicotine dependence, unspecified, uncomplicated: Secondary | ICD-10-CM

## 2023-04-16 DIAGNOSIS — Z951 Presence of aortocoronary bypass graft: Secondary | ICD-10-CM | POA: Diagnosis not present

## 2023-04-16 DIAGNOSIS — I700 Atherosclerosis of aorta: Secondary | ICD-10-CM

## 2023-04-16 DIAGNOSIS — F3170 Bipolar disorder, currently in remission, most recent episode unspecified: Secondary | ICD-10-CM | POA: Diagnosis not present

## 2023-04-16 DIAGNOSIS — Z95828 Presence of other vascular implants and grafts: Secondary | ICD-10-CM

## 2023-04-16 DIAGNOSIS — I452 Bifascicular block: Secondary | ICD-10-CM

## 2023-04-16 DIAGNOSIS — I10 Essential (primary) hypertension: Secondary | ICD-10-CM

## 2023-04-16 DIAGNOSIS — J4530 Mild persistent asthma, uncomplicated: Secondary | ICD-10-CM

## 2023-04-16 DIAGNOSIS — E785 Hyperlipidemia, unspecified: Secondary | ICD-10-CM

## 2023-04-16 DIAGNOSIS — F1021 Alcohol dependence, in remission: Secondary | ICD-10-CM | POA: Diagnosis not present

## 2023-04-16 DIAGNOSIS — R3914 Feeling of incomplete bladder emptying: Secondary | ICD-10-CM

## 2023-04-16 DIAGNOSIS — N401 Enlarged prostate with lower urinary tract symptoms: Secondary | ICD-10-CM

## 2023-04-16 DIAGNOSIS — J4489 Other specified chronic obstructive pulmonary disease: Secondary | ICD-10-CM | POA: Diagnosis not present

## 2023-04-16 DIAGNOSIS — F79 Unspecified intellectual disabilities: Secondary | ICD-10-CM | POA: Diagnosis not present

## 2023-04-16 DIAGNOSIS — G4733 Obstructive sleep apnea (adult) (pediatric): Secondary | ICD-10-CM

## 2023-04-16 DIAGNOSIS — I739 Peripheral vascular disease, unspecified: Secondary | ICD-10-CM | POA: Diagnosis not present

## 2023-04-16 DIAGNOSIS — J3089 Other allergic rhinitis: Secondary | ICD-10-CM | POA: Diagnosis not present

## 2023-04-16 MED ORDER — ATORVASTATIN CALCIUM 80 MG PO TABS: 80.0000 mg | ORAL_TABLET | Freq: Every day | ORAL | 3 refills | Status: DC

## 2023-04-16 MED ORDER — DIVALPROEX SODIUM ER 500 MG PO TB24: 500.0000 mg | ORAL_TABLET | Freq: Three times a day (TID) | ORAL | 1 refills | Status: DC

## 2023-04-16 MED ORDER — LAMOTRIGINE 200 MG PO TABS: 200.0000 mg | ORAL_TABLET | Freq: Every morning | ORAL | 3 refills | Status: DC

## 2023-04-16 MED ORDER — ALBUTEROL SULFATE HFA 108 (90 BASE) MCG/ACT IN AERS: INHALATION_SPRAY | RESPIRATORY_TRACT | 1 refills | Status: DC

## 2023-04-16 MED ORDER — LISINOPRIL-HYDROCHLOROTHIAZIDE 20-12.5 MG PO TABS: 1.0000 | ORAL_TABLET | Freq: Every morning | ORAL | 2 refills | Status: DC

## 2023-04-16 MED ORDER — AZELASTINE HCL 0.1 % NA SOLN: 1.0000 | Freq: Two times a day (BID) | NASAL | 5 refills | Status: DC | PRN

## 2023-04-16 MED ORDER — FINASTERIDE 5 MG PO TABS: 5.0000 mg | ORAL_TABLET | Freq: Every day | ORAL | 3 refills | Status: DC

## 2023-04-16 MED ORDER — FLUTICASONE-SALMETEROL 500-50 MCG/ACT IN AEPB: 1.0000 | INHALATION_SPRAY | Freq: Two times a day (BID) | RESPIRATORY_TRACT | 3 refills | Status: DC

## 2023-04-16 MED ORDER — AMLODIPINE BESYLATE 10 MG PO TABS: 10.0000 mg | ORAL_TABLET | Freq: Every day | ORAL | 3 refills | Status: DC

## 2023-04-16 NOTE — Progress Notes (Signed)
Complete physical exam  Patient: Gregory [name]   DOB: 1951-04-26   72 y.o. Male  MRN: [id number]  Subjective:    Chief Complaint  Patient presents with   Annual Exam    Fasting. Has additional concerns. Had AWV with [facility] on 04/14/2023.    Gregory [name] is a 72 y.o. male who presents today for a complete physical exam. He reports consuming a general diet. Gym/ health club routine includes cardio and weight lifting. He generally feels fairly well. He reports sleeping fairly well. He does have additional problems to discuss today (sore throat x 5 days.Home COVID test negative 2 days ago. Also right hand pain) He apparently did get Tdap and is planning to go back to the pharmacy to get COVID and RSV.  He does have underlying allergies with sneezing, itchy watery eyes, PND, hoarse voice and left earache.  He was seen recently for right hand pain and x-rays were negative.  He does continue to smoke.  He is followed by Dr. Evelene Croon for his underlying bipolar disorder as well as previous alcohol and drug abuse history.  Alcohol free for9 years Continues on atorvastatin.  He is also taking Advair and Ventolin which seems to control his breathing.  He does use it for exercise-induced asthma as well. He does have OSA and was wondering if he would qualify for Inspire.  He was supposed to get a sleep study but apparently has not gotten it yet. Most recent fall risk assessment:    04/10/2023    9:04 AM  Fall Risk   Falls in the past year? 0  Number falls in past yr: 0  Injury with Fall? 0  Risk for fall due to : Medication side effect  Follow up Falls prevention discussed;Education provided;Falls evaluation completed     Most recent depression screenings:    04/14/2023    8:52 AM 04/11/2022    8:53 AM  PHQ 2/9 Scores  PHQ - 2 Score 0 0  PHQ- 9 Score 0 0    Vision:Within last year and Dental: Receives regular dental care    Patient Care Team: Ronnald Nian, MD as PCP - General  (Family Medicine) Swaziland, [name], MD as PCP - Cardiology (Cardiology) Leitha Bleak Milas Kocher, [facility] (Pharmacist)   Outpatient Medications Prior to Visit  Medication Sig Note   acetaminophen (TYLENOL) 650 MG CR tablet Take 650 mg by mouth every 8 (eight) hours as needed for pain.    amoxicillin (AMOXIL) 500 MG capsule Take 4 capsules (2,000 mg total) by mouth once as needed for up to 1 dose. Take 4 tablets one hour before dental work 04/16/2023: prn   aspirin EC 81 MG tablet Take 1 tablet (81 mg total) by mouth daily. Swallow whole.    Dextromethorphan-guaiFENesin 5-100 MG/5ML LIQD Take 20 mLs by mouth every 4 (four) hours.    Fexofenadine HCl (ALLEGRA ALLERGY PO) Take by mouth.    guaiFENesin 200 MG tablet Take 200 mg by mouth every 4 (four) hours as needed for cough or to loosen phlegm. 04/16/2023: prn   loratadine (CLARITIN) 10 MG tablet Take 1 tablet by mouth daily.    Multiple Vitamins-Minerals (MULTIVITAMIN ADULTS 50+ PO) Take 1 tablet by mouth daily.     nicotine polacrilex (COMMIT) 4 MG lozenge Take 4 mg by mouth as needed for smoking cessation.    [DISCONTINUED] albuterol (VENTOLIN HFA) 108 (90 Base) MCG/ACT inhaler INHALE TWO PUFFS BY MOUTH EVERY 6 HOURS AS NEEDED FOR  WHEEZING    [DISCONTINUED] amLODipine (NORVASC) 10 MG tablet Take 1 tablet (10 mg total) by mouth daily.    [DISCONTINUED] atorvastatin (LIPITOR) 80 MG tablet Take 1 tablet (80 mg total) by mouth daily.    [DISCONTINUED] azelastine (ASTELIN) 0.1 % nasal spray Place 1-2 sprays into both nostrils 2 (two) times daily as needed (nasal drainage). Use in each nostril as directed    [DISCONTINUED] divalproex (DEPAKOTE ER) 500 MG 24 hr tablet Take 500 mg by mouth 3 (three) times daily. 2- 3 tablets once daily    [DISCONTINUED] finasteride (PROSCAR) 5 MG tablet Take 1 tablet (5 mg total) by mouth daily.    [DISCONTINUED] fluticasone-salmeterol (ADVAIR DISKUS) 500-50 MCG/ACT AEPB Inhale 1 puff into the lungs in the morning and at bedtime.     [DISCONTINUED] lamoTRIgine (LAMICTAL) 200 MG tablet Take 1 tablet (200 mg total) by mouth every morning.    [DISCONTINUED] lisinopril-hydrochlorothiazide (ZESTORETIC) 20-12.5 MG tablet Take 1 tablet by mouth every morning.    No facility-administered medications prior to visit.    Review of Systems  All other systems reviewed and are negative.         Objective:     BP 138/60   Pulse (!) 59   Temp 97.9 F (36.6 C) (Oral)   Resp 16   Wt 138 lb 6.4 oz (62.8 kg)   SpO2 95% Comment: room air  BMI 22.68 kg/m    Physical Exam  Alert and in no distress. Tympanic membranes and canals are normal. Pharyngeal area is normal. Neck is supple without adenopathy or thyromegaly. Cardiac exam shows a regular sinus rhythm with a grade 2 murmur, no gallops. Lungs are clear to auscultation.      Assessment & Plan:    Routine Health Maintenance and Physical Exam  Immunization History  Administered Date(s) Administered   COVID-19, mRNA, vaccine(Comirnaty)12 years and older 09/30/2022   Fluad Quad(high Dose 65+) 10/07/2021, 09/30/2022   Influenza Split 09/28/2012, 10/19/2014, 09/17/2015   Influenza Whole 10/08/2010   Influenza, High Dose Seasonal PF 10/22/2017, 10/12/2018, 08/24/2019   Influenza-Unspecified 09/21/2013, 10/09/2016, 10/22/2017   PFIZER(Purple Top)SARS-COV-2 Vaccination 01/30/2020, 02/24/2020, 11/11/2020   Pfizer Covid-19 Vaccine Bivalent Booster 40yrs & up 10/07/2021   Pneumococcal Conjugate-13 01/31/2016   Pneumococcal Polysaccharide-23 08/30/2013   Respiratory Syncytial Virus Vaccine,Recomb Aduvanted(Arexvy) 09/09/2022   Tdap 10/29/2012   Zoster Recombinat (Shingrix) 04/23/2017, 07/19/2017    Health Maintenance  Topic Date Due   DTaP/Tdap/Td (2 - Td or Tdap) 10/29/2022   COVID-19 Vaccine (6 - 2023-24 season) 11/25/2022   INFLUENZA VACCINE  07/23/2023   Medicare Annual Wellness (AWV)  04/13/2024   Fecal DNA (Cologuard)  10/21/2024   Hepatitis C Screening   Completed   Zoster Vaccines- Shingrix  Completed   HPV VACCINES  Aged Out   Pneumonia Vaccine 32+ Years old  Discontinued   COLONOSCOPY (Pts 45-23yrs Insurance coverage will need to be confirmed)  Discontinued    Discussed health benefits of physical activity, and encouraged him to engage in regular exercise appropriate for his age and condition.  Recommend that he get the sleep study done through ENT and follow-up concerning any further interventions.  Discussed the right hand pain and at this point no intervention needed.  Recommend he maximally treat his allergies.  Continue on present medication regimens.  Follow-up with audiology as well as cardiovascular and to Dr. Evelene Croon  Problem List Items Addressed This Visit     Bipolar disorder (Chronic)   Relevant Medications   lamoTRIgine (  LAMICTAL) 200 MG tablet   divalproex (DEPAKOTE ER) 500 MG 24 hr tablet   Other Relevant Orders   Valproic acid level   CAD (coronary artery disease) (Chronic)   Relevant Medications   lisinopril-hydrochlorothiazide (ZESTORETIC) 20-12.5 MG tablet   atorvastatin (LIPITOR) 80 MG tablet   amLODipine (NORVASC) 10 MG tablet   Other Relevant Orders   Lipid panel   Hyperlipidemia with target LDL less than 130 (Chronic)   Relevant Medications   lisinopril-hydrochlorothiazide (ZESTORETIC) 20-12.5 MG tablet   atorvastatin (LIPITOR) 80 MG tablet   amLODipine (NORVASC) 10 MG tablet   Hypertension (Chronic)   Relevant Medications   lisinopril-hydrochlorothiazide (ZESTORETIC) 20-12.5 MG tablet   atorvastatin (LIPITOR) 80 MG tablet   amLODipine (NORVASC) 10 MG tablet   Other Relevant Orders   CBC with Differential/Platelet   Comprehensive metabolic panel   OSA (obstructive sleep apnea) (Chronic)   PAD (peripheral artery disease) (Chronic)   Relevant Medications   lisinopril-hydrochlorothiazide (ZESTORETIC) 20-12.5 MG tablet   atorvastatin (LIPITOR) 80 MG tablet   amLODipine (NORVASC) 10 MG tablet   RBBB (right  bundle branch block with left anterior fascicular block) (Chronic)   Relevant Medications   lisinopril-hydrochlorothiazide (ZESTORETIC) 20-12.5 MG tablet   atorvastatin (LIPITOR) 80 MG tablet   amLODipine (NORVASC) 10 MG tablet   Asthma-COPD overlap syndrome   Relevant Medications   fluticasone-salmeterol (ADVAIR DISKUS) 500-50 MCG/ACT AEPB   azelastine (ASTELIN) 0.1 % nasal spray   albuterol (VENTOLIN HFA) 108 (90 Base) MCG/ACT inhaler   Mentally disabled   Mild persistent asthma   Relevant Medications   fluticasone-salmeterol (ADVAIR DISKUS) 500-50 MCG/ACT AEPB   albuterol (VENTOLIN HFA) 108 (90 Base) MCG/ACT inhaler   Perennial allergic rhinitis   Relevant Medications   azelastine (ASTELIN) 0.1 % nasal spray   S/P aortic valve replacement with bioprosthetic valve   S/P aortobifemoral bypass surgery   S/P CABG x 1   Other Visit Diagnoses     Routine general medical examination at a health care facility    -  Primary   Essential hypertension       Relevant Medications   lisinopril-hydrochlorothiazide (ZESTORETIC) 20-12.5 MG tablet   atorvastatin (LIPITOR) 80 MG tablet   amLODipine (NORVASC) 10 MG tablet   Benign prostatic hyperplasia with incomplete bladder emptying       Relevant Medications   finasteride (PROSCAR) 5 MG tablet      No follow-ups on file.     [name], MD

## 2023-04-16 NOTE — Telephone Encounter (Signed)
Pharmacy called to clarify directions on the Depakote, per Dr. Susann Givens call pt to confirm, he was getting this from Dr. Evelene Croon previously.  Per pt he takes 3 times day but occasionally he only takes 2 time if feels doesn't need it & this was ok with previous doctor.  Advised him will leave as 3 times a day since this is what is his normal directions. Called pharmacy & corrected sig.

## 2023-04-17 LAB — LIPID PANEL
Chol/HDL Ratio: 2.7 ratio (ref 0.0–5.0)
Cholesterol, Total: 110 mg/dL (ref 100–199)
HDL: 41 mg/dL (ref >39)
LDL Chol Calc (NIH): 50 mg/dL (ref 0–99)
Triglycerides: 98 mg/dL (ref 0–149)
VLDL Cholesterol Cal: 19 mg/dL (ref 5–40)

## 2023-04-17 LAB — COMPREHENSIVE METABOLIC PANEL
ALT: 24 IU/L (ref 0–44)
AST: 27 IU/L (ref 0–40)
Albumin/Globulin Ratio: 1.8 (ref 1.2–2.2)
Albumin: 4.5 g/dL (ref 3.8–4.8)
Alkaline Phosphatase: 62 IU/L (ref 44–121)
BUN/Creatinine Ratio: 23 (ref 10–24)
BUN: 21 mg/dL (ref 8–27)
Bilirubin Total: 0.4 mg/dL (ref 0.0–1.2)
CO2: 24 mmol/L (ref 20–29)
Calcium: 9.4 mg/dL (ref 8.6–10.2)
Chloride: 101 mmol/L (ref 96–106)
Creatinine, Ser: 0.92 mg/dL (ref 0.76–1.27)
Globulin, Total: 2.5 g/dL (ref 1.5–4.5)
Glucose: 91 mg/dL (ref 70–99)
Potassium: 4.6 mmol/L (ref 3.5–5.2)
Sodium: 139 mmol/L (ref 134–144)
Total Protein: 7 g/dL (ref 6.0–8.5)
eGFR: 88 mL/min/{1.73_m2} (ref >59)

## 2023-04-17 LAB — VALPROIC ACID LEVEL: Valproic Acid Lvl: 50 ug/mL (ref 50–100)

## 2023-04-17 LAB — CBC WITH DIFFERENTIAL/PLATELET
Basophils Absolute: 0 10*3/uL (ref 0.0–0.2)
Basos: 1 %
EOS (ABSOLUTE): 0.2 10*3/uL (ref 0.0–0.4)
Eos: 3 %
Hematocrit: 41.5 % (ref 37.5–51.0)
Hemoglobin: 13.8 g/dL (ref 13.0–17.7)
Immature Grans (Abs): 0 10*3/uL (ref 0.0–0.1)
Immature Granulocytes: 0 %
Lymphocytes Absolute: 1.4 10*3/uL (ref 0.7–3.1)
Lymphs: 21 %
MCH: 30.1 pg (ref 26.6–33.0)
MCHC: 33.3 g/dL (ref 31.5–35.7)
MCV: 91 fL (ref 79–97)
Monocytes Absolute: 0.6 10*3/uL (ref 0.1–0.9)
Monocytes: 9 %
Neutrophils Absolute: 4.4 10*3/uL (ref 1.4–7.0)
Neutrophils: 66 %
Platelets: 144 10*3/uL — ABNORMAL LOW (ref 150–450)
RBC: 4.58 x10E6/uL (ref 4.14–5.80)
RDW: 14.1 % (ref 11.6–15.4)
WBC: 6.7 10*3/uL (ref 3.4–10.8)

## 2023-05-04 ENCOUNTER — Telehealth: Payer: Self-pay | Admitting: Family Medicine

## 2023-05-04 DIAGNOSIS — M79641 Pain in right hand: Secondary | ICD-10-CM

## 2023-05-04 NOTE — Telephone Encounter (Signed)
Pt called today and says he is still having right hand pain that is not getting any better. He is asking if he can get a referral to a hand specialist and wants to know if you have any recommendations.

## 2023-05-21 ENCOUNTER — Ambulatory Visit
Admission: RE | Admit: 2023-05-21 | Discharge: 2023-05-21 | Disposition: A | Discharge status: Home / Self Care | Payer: PPO | Source: Ambulatory Visit | Attending: Family Medicine | Admitting: Family Medicine

## 2023-05-21 DIAGNOSIS — Z87891 Personal history of nicotine dependence: Secondary | ICD-10-CM | POA: Diagnosis not present

## 2023-05-25 ENCOUNTER — Other Ambulatory Visit: Payer: Self-pay | Admitting: Allergy

## 2023-05-25 DIAGNOSIS — J3089 Other allergic rhinitis: Secondary | ICD-10-CM

## 2023-05-26 DIAGNOSIS — M7711 Lateral epicondylitis, right elbow: Secondary | ICD-10-CM | POA: Diagnosis not present

## 2023-05-26 DIAGNOSIS — M79641 Pain in right hand: Secondary | ICD-10-CM | POA: Diagnosis not present

## 2023-05-26 DIAGNOSIS — I700 Atherosclerosis of aorta: Secondary | ICD-10-CM | POA: Insufficient documentation

## 2023-06-03 NOTE — Progress Notes (Addendum)
Cardiology Clinic Note   Date: 06/04/2023 Gregory [name], Gregory [name], Gregory [name], Gregory [name]  Primary Cardiologist:  Peter Swaziland, MD  Patient Profile    Gregory [name] is a 72 y.o. male who presents to the clinic today for 1 year follow up.      Past medical history significant for: CAD. R/LHC 11/10/2019: Proximal mid LAD 99%.  Mid to distal Cx 50%.  Mid RCA 30%.  Severe aortic valve stenosis, mean gradient 47 mmHg.  CTS consult. CABG x 1 11/24/2019: LIMA to LAD. CHB postop. Regained 1:1 AV conduction.  Aortic valve stenosis. AVR 11/24/2019: [name] mm Edwards Inspiris Resilia pericardial valve. Echo 12/13/2019: EF 70 to 75%.  Normal function of bioprosthetic aortic valve. PAD.  Lower extremities 09/03/2020: Probable right aortoiliac arterial occlusive disease.  Probable left aortoiliac arterial occlusive disease, 50 to 74% stenosis noted in the left superficial femoral artery, superficial femoral artery and/or popliteal artery. Aortobifemoral bypass graft, reimplantation of the inferior mesenteric artery, aortic endarterectomy, bilateral common femoral/superficial femoral/profundofemoral endarterectomy 10/03/2020. ABI 11/27/2022: Right ABI within normal range.  Left ABI indicates mild arterial disease. RBBB. Hypertension.  Hyperlipidemia.  Panel 04/16/2023: LDL 50, HDL 41, TG 98, total 110. Tobacco abuse.     History of Present Illness    Gregory [name] is a long time patient of Dr. Swaziland. He was lost to follow up until he was seen in the office by Dr. Swaziland on 11/02/2019 for CAD and aortic stenosis at the request of Dr. Susann Givens. Echo prior to visit showed severe aortic stenosis. Patient underwent R/LHC which showed subtotal mid LAD and severe aortic stenosis. He underwent CABG x 1 and AVR in December 2020. Postoperatively he developed complete heart block for several days before regaining 1:1 AV conduction. All AV nodal blocking drugs were held. Event monitor after discharge  showed NSR with rare PVCs and PACs; no significant arrhythmia or AV block.  Patient was evaluated by Dr. Allyson Sabal on 04/10/2020 for lifestyle limiting claudication.  Vascular ultrasound showed bilateral iliac artery occlusions.  Follow-up CTA abdomen pelvis showed infrarenal aortic stenosis with right common iliac artery occlusion and occluded left external iliac and common femoral arteries.  He was referred to vascular surgery and ultimately underwent aortofemoral bypass grafting (as detailed above).  Patient continues to be followed by Dr. Swaziland for the above outlined history.  Seen in the office by Dr. Swaziland on 06/02/2022 for routine follow-up.  He was doing well at that time with complaints of some shortness of breath with yard work managed with inhaler.  No medication changes were made.  Today, patient reports overall he is doing well. Patient denies shortness of breath or dyspnea on exertion. No chest pain, pressure, or tightness. Denies lower extremity edema, orthopnea, or PND. No palpitations. He is concerned about his heart rate dropping to 40 on occasion. He states in the last six months he has had 3 episodes of his heart rate going down to 40 bpm while reading a book and feeling very dizzy. He has to get up and walk around to get his heart rate to increase back to baseline of 45-50. He stays well hydrated. 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 On his work days he gets about 2.5 miles in steps. He also does 3 miles on the elliptical 2-3 days a week and lifts weights a few times a week. He is able to get his heart rate as high  as 140 bpm but typically keeps it in the 90 bpm range. He continues to smoke 1/2 pack per day. He is interested in quitting completely. He has been smoking since age 31.     ROS: All other systems reviewed and are otherwise negative except as noted in History of Present Illness.  Studies Reviewed    ECG personally reviewed by me today:  Sinus bradycardia, bifascicular block, 45 bpm.  No significant changes from 06/02/2022.       Physical Exam    VS:  BP (!) 144/62 (BP Location: Left Arm, Patient Position: Sitting, Cuff Size: Normal)   Pulse (!) 45   Ht 5\' 5"  (1.651 m)   Wt 137 lb 12.8 oz (62.5 kg)   SpO2 99%   BMI 22.93 kg/m  , BMI Body mass index is 22.93 kg/m.  GEN: Well nourished, well developed, in no acute distress. Neck: No JVD or carotid bruits. Cardiac:  RRR. 2/6 systolic murmur. No rubs or gallops.   Respiratory:  Respirations regular and unlabored. Clear to auscultation without rales or rhonchi. Expiratory wheeze upper bases.  GI: Soft, nontender, nondistended. Extremities: Radials/DP/PT 2+ and equal bilaterally. No clubbing or cyanosis. No edema.  Skin: Warm and dry, no rash. Neuro: Strength intact.  Assessment & Plan    CAD.  S/p CABG x 22 November 2019.  Patient denies chest pain, pressure, tightness.  He is very active working part-time as a Investment banker, operational at [employer] doing the elliptical/weightlifting 2-3 times a week.  Continue aspirin, atorvastatin.  Not on beta-blocker secondary to avoidance of AV nodal blockers (see #4). Aortic stenosis.  S/p AVR December 2020.  Patient denies lower extremity edema, DOE, orthopnea, PND.  Continue SBE. PAD.  S/p aortobifemoral bypass graft October 2021.  ABI December 2023 ABI within normal range on the right, left ABI indicates mild arterial disease.  Patient denies claudication. He is very active walking 2.5 miles on his work days and using the elliptical 2-3 days a week. Continue aspirin, atorvastatin. Complete heart block.  Occurred postoperatively CABG/AVR December 2020.  Resolved.  Continue to avoid AV nodal blockers. Bradycardia. Patient is normally bradycardic. However, he reports 3 episodes in the last 6 months where his heart rate will get down to 40 and he will feel dizzy.  He has to get up and walk around to increase his heart rate.  This is new for him.  He is  able to bring his heart rate up while exercising and can get it as high as 140 bpm.  He typically keeps it in the 90 bpm range.  Will get a 2-week ZIO for further evaluation. Hypertension. BP today 144/62.  Patient denies headaches, dizziness or vision changes. Continue amlodipine, Zestoretic. Hyperlipidemia.  LDL April 2024 50, at goal.  Continue atorvastatin. Tobacco abuse.  Patient continues to smoke half pack a day.  He is interested in quitting.  Discussed systematically reducing cigarettes until complete cessation.  Disposition: 2 week zio. Return in 1 year or sooner as needed. Depending on results of monitor may need to refer to EP.          Signed, Etta Grandchild. Delano Frate, DNP, NP-C

## 2023-06-04 ENCOUNTER — Encounter: Payer: Self-pay | Admitting: Student

## 2023-06-04 ENCOUNTER — Ambulatory Visit (INDEPENDENT_AMBULATORY_CARE_PROVIDER_SITE_OTHER): Payer: PPO

## 2023-06-04 ENCOUNTER — Ambulatory Visit: Payer: PPO | Attending: Student | Admitting: Student

## 2023-06-04 VITALS — BP 144/62 | HR 45 | Ht 65.0 in | Wt 137.8 lb

## 2023-06-04 DIAGNOSIS — I10 Essential (primary) hypertension: Secondary | ICD-10-CM | POA: Diagnosis not present

## 2023-06-04 DIAGNOSIS — Z953 Presence of xenogenic heart valve: Secondary | ICD-10-CM | POA: Diagnosis not present

## 2023-06-04 DIAGNOSIS — R001 Bradycardia, unspecified: Secondary | ICD-10-CM

## 2023-06-04 DIAGNOSIS — I2510 Atherosclerotic heart disease of native coronary artery without angina pectoris: Secondary | ICD-10-CM | POA: Diagnosis not present

## 2023-06-04 DIAGNOSIS — I350 Nonrheumatic aortic (valve) stenosis: Secondary | ICD-10-CM

## 2023-06-04 DIAGNOSIS — Z720 Tobacco use: Secondary | ICD-10-CM

## 2023-06-04 DIAGNOSIS — I739 Peripheral vascular disease, unspecified: Secondary | ICD-10-CM

## 2023-06-04 DIAGNOSIS — E785 Hyperlipidemia, unspecified: Secondary | ICD-10-CM | POA: Diagnosis not present

## 2023-06-04 NOTE — Patient Instructions (Signed)
Medication Instructions:  Your physician recommends that you continue on your current medications as directed. Please refer to the Current Medication list given to you today.  *If you need a refill on your cardiac medications before your next appointment, please call your pharmacy*   Lab Work: NONE If you have labs (blood work) drawn today and your tests are completely normal, you will receive your results only by: Gregory [name] (if you have MyChart) OR A paper copy in the mail If you have any lab test that is abnormal or we need to change your treatment, we will call you to review the results.   Testing/Procedures: Gregory [name]- Long Term Monitor Instructions  Your physician has requested you wear a ZIO patch monitor for 14 days.  This is a single patch monitor. [employer] supplies one patch monitor per enrollment. Additional stickers are not available. Please do not apply patch if you will be having a Nuclear Stress Test,  Echocardiogram, Cardiac CT, MRI, or Chest Xray during the period you would be wearing the  monitor. The patch cannot be worn during these tests. You cannot remove and re-apply the  ZIO XT patch monitor.  Your ZIO patch monitor will be mailed 3 day USPS to your address on file. It may take 3-5 days  to receive your monitor after you have been enrolled.  Once you have received your monitor, please review the enclosed instructions. Your monitor  has already been registered assigning a specific monitor serial # to you.  Billing and Patient Assistance Program Information  We have supplied [employer] with any of your insurance information on file for billing purposes. [employer] offers a sliding scale Patient Assistance Program for patients that do not have  insurance, or whose insurance does not completely cover the cost of the ZIO monitor.  You must apply for the Patient Assistance Program to qualify for this discounted rate.  To apply, please call [employer] at [phone], select  option 4, select option 2, ask to apply for  Patient Assistance Program. Gregory [name] will ask your household income, and how many people  are in your household. They will quote your out-of-pocket cost based on that information.  [employer] will also be able to set up a 26-month interest-free payment plan if needed.  Applying the monitor   Shave hair from upper left chest.  Hold abrader disc by orange tab. Rub abrader in 40 strokes over the upper left chest as  indicated in your monitor instructions.  Clean area with 4 enclosed alcohol pads. Let dry.  Apply patch as indicated in monitor instructions. Patch will be placed under collarbone on left  side of chest with arrow pointing upward.  Rub patch adhesive wings for 2 minutes. Remove white label marked "1". Remove the white  label marked "2". Rub patch adhesive wings for 2 additional minutes.  While looking in a mirror, press and release button in [name] of patch. A small green light will  flash 3-4 times. This will be your only indicator that the monitor has been turned on.  Do not shower for the first 24 hours. You may shower after the first 24 hours.  Press the button if you feel a symptom. You will hear a small click. Record Date, Time and  Symptom in the Patient Logbook.  When you are ready to remove the patch, follow instructions on the last 2 pages of Patient  Logbook. Stick patch monitor onto the last page of Patient Logbook.  Place Patient Logbook in  the blue and white box. Use locking tab on box and tape box closed  securely. The blue and white box has prepaid postage on it. Please place it in the mailbox as  soon as possible. Your physician should have your test results approximately 7 days after the  monitor has been mailed back to Gregory [name].  Call Gregory [name] Customer Care at [phone] if you have questions regarding  your ZIO XT patch monitor. Call them immediately if you see an orange light blinking on your  monitor.   If your monitor falls off in less than 4 days, contact our Monitor department at [phone].  If your monitor becomes loose or falls off after 4 days call [employer] at [phone] for  suggestions on securing your monitor    Follow-Up: At [facility], you and your health needs are our priority.  As part of our continuing mission to provide you with exceptional heart care, we have created designated Provider Care Teams.  These Care Teams include your primary Cardiologist (physician) and Advanced Practice Providers (APPs -  Physician Assistants and Nurse Practitioners) who all work together to provide you with the care you need, when you need it.  We recommend signing up for the patient portal called "MyChart".  Sign up information is provided on this After Visit Summary.  MyChart is used to connect with patients for Virtual Visits (Telemedicine).  Patients are able to view lab/test results, encounter notes, upcoming appointments, etc.  Non-urgent messages can be sent to your provider as well.   To learn more about what you can do with MyChart, go to [email or web address].    Your next appointment:   1 year(s)  Provider:   Peter Swaziland, MD

## 2023-06-04 NOTE — Progress Notes (Unsigned)
Enrolled patient for a 14 day Zio XT monitor to be mailed to patients home  Jordan to read 

## 2023-06-05 ENCOUNTER — Ambulatory Visit (INDEPENDENT_AMBULATORY_CARE_PROVIDER_SITE_OTHER): Payer: PPO | Admitting: Family Medicine

## 2023-06-05 ENCOUNTER — Encounter: Payer: Self-pay | Admitting: Family Medicine

## 2023-06-05 VITALS — BP 132/78 | HR 54 | Ht 65.0 in | Wt 137.8 lb

## 2023-06-05 DIAGNOSIS — J0100 Acute maxillary sinusitis, unspecified: Secondary | ICD-10-CM

## 2023-06-05 DIAGNOSIS — J301 Allergic rhinitis due to pollen: Secondary | ICD-10-CM | POA: Diagnosis not present

## 2023-06-05 MED ORDER — AMOXICILLIN-POT CLAVULANATE 875-125 MG PO TABS
1.0000 | ORAL_TABLET | Freq: Two times a day (BID) | ORAL | 0 refills | Status: DC
Start: 2023-06-05 — End: 2023-07-16

## 2023-06-05 NOTE — Progress Notes (Addendum)
   Subjective:    Patient ID: Gregory [name], male    DOB: 12-Mar-1951, 72 y.o.   MRN: [id number]  HPI He complains of a 1 week history of sneezing, rhinorrhea, sore throat, PND, upper tooth discomfort.  He does have underlying allergies and uses OTC meds for relief of them.  He is in the process of being evaluated for bradycardia.   Review of Systems     Objective:   Physical Exam Alert and in no distress.  Slight tenderness palpation over the maxillary sinuses.  Tympanic membranes and canals are normal. Pharyngeal area is normal. Neck is supple without adenopathy or thyromegaly. Cardiac exam shows a regular sinus rhythm without murmurs or gallops. Lungs are clear to auscultation.        Assessment & Plan:  Acute non-recurrent maxillary sinusitis - Plan: amoxicillin-clavulanate (AUGMENTIN) 875-125 MG tablet  Allergic rhinitis due to pollen, unspecified seasonality This has worked well in the past for him.  He will call me if he has further difficulty.

## 2023-06-08 ENCOUNTER — Telehealth: Payer: Self-pay

## 2023-06-08 NOTE — Progress Notes (Unsigned)
   Care Guide Note  06/08/2023 Name: Gregory [name] MRN: [id number] DOB: 03/07/1951  Referred by: Ronnald Nian, MD Reason for referral : Care Coordination (Outreach to schedule f/u with [facility] July / August for asthma COPD overlap and med access)   Gregory [name] is a 72 y.o. year old male who is a primary care patient of Ronnald Nian, MD. Gregory [name] was referred to the pharmacist for assistance related to COPD.    An unsuccessful telephone outreach was attempted today to contact the patient who was referred to the pharmacy team for assistance with medication management. Additional attempts will be made to contact the patient.   Penne Lash, RMA Care Guide [facility]  [place], [place] [address] Direct Dial: [phone] [name]

## 2023-06-09 DIAGNOSIS — R001 Bradycardia, unspecified: Secondary | ICD-10-CM | POA: Diagnosis not present

## 2023-06-09 NOTE — Progress Notes (Signed)
   Care Guide Note  06/09/2023 Name: Gregory [name] MRN: [id number] DOB: 1951-11-06  Referred by: Ronnald Nian, MD Reason for referral : Care Coordination (Outreach to schedule f/u with [facility] Pharm d July / August for asthma COPD overlap and med access)   Gregory [name] is a 72 y.o. year old male who is a primary care patient of Ronnald Nian, MD. Gregory [name] was referred to the pharmacist for assistance related to COPD.    Successful contact was made with the patient to discuss pharmacy services including being ready for the pharmacist to call at least 5 minutes before the scheduled appointment time, to have medication bottles and any blood sugar or blood pressure readings ready for review. The patient agreed to meet with the pharmacist via with the pharmacist via telephone visit on (date/time).  07/16/2023  Penne Lash, RMA Care Guide [facility]  [place], [place] [address] Direct Dial: [phone] [email or web address]

## 2023-06-26 ENCOUNTER — Other Ambulatory Visit: Payer: Self-pay | Admitting: Nurse Practitioner

## 2023-06-26 DIAGNOSIS — J0140 Acute pansinusitis, unspecified: Secondary | ICD-10-CM

## 2023-06-29 DIAGNOSIS — R001 Bradycardia, unspecified: Secondary | ICD-10-CM | POA: Diagnosis not present

## 2023-07-16 ENCOUNTER — Other Ambulatory Visit: Payer: PPO

## 2023-07-16 NOTE — Progress Notes (Signed)
07/29/2023 Name: Gregory [name] MRN: [id number] DOB: 12-13-1951  Chief Complaint  Patient presents with   Medication Management   Gregory [name] is a 72 y.o. year old male who presented for a telephone visit to establish care with [facility]  Subjective:  Care Team: Primary Care Provider: Ronnald Nian, MD ; Next Scheduled Visit: 05/06/24  Medication Access/Adherence  Current Pharmacy:  [facility]) E[facility] - [place], [place] - [address] [name] [address] Phone: [phone] Fax: [phone]  [facility] [id number] [place], [place] - [address] [place] [address] Phone: [phone] Fax: [phone]  Patient reports affordability concerns with their medications: No  Patient reports access/transportation concerns to their pharmacy: No  Patient reports adherence concerns with their medications:  No  -Patient does state he does not believe finasteride is making a difference for him and prefers not to refills  Hypertension: Current medications: lisinopril/hydrochlorothiazide 20/12.5mg  daily, amlodipine 10mg  daily -He does not check home BP -Last clinic value was 132/78 on 6/14 -Patient denies hypotensive s/sx including dizziness, lightheadedness.  -Patient denies hypertensive symptoms including headache, chest pain, shortness of breath  Hyperlipidemia/ASCVD Risk Reduction Current lipid lowering medications: atorvastatin 80mg  daily Antiplatelet regimen: ASA 81mg  daily   Objective: Lab Results  Component Value Date   HGBA1C 5.9 (H) 10/04/2020   Lab Results  Component Value Date   CREATININE 0.92 04/16/2023   BUN 21 04/16/2023   NA 139 04/16/2023   K 4.6 04/16/2023   CL 101 04/16/2023   CO2 24 04/16/2023   Lab Results  Component Value Date   CHOL 110 04/16/2023   HDL 41 04/16/2023   LDLCALC 50 04/16/2023   TRIG 98 04/16/2023   CHOLHDL 2.7 04/16/2023   Medications  Reviewed Today     Reviewed by Lenna Gilford, RPH (Pharmacist) on 07/16/23 at 0854  Med List Status: <None>   Medication Order Taking? Sig Documenting Provider Last Dose Status Informant  albuterol (VENTOLIN HFA) 108 (90 Base) MCG/ACT inhaler [id number] Yes INHALE TWO PUFFS BY MOUTH EVERY 6 HOURS AS NEEDED FOR WHEEZING Ronnald Nian, MD Taking Active   amLODipine (NORVASC) 10 MG tablet [id number] Yes Take 1 tablet (10 mg total) by mouth daily. Ronnald Nian, MD Taking Active   amoxicillin (AMOXIL) 500 MG capsule [id number] Yes Take 4 capsules (2,000 mg total) by mouth once as needed for up to 1 dose. 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 Swallow whole. [place], [name], MD Taking Active   atorvastatin (LIPITOR) 80 MG tablet [id number] Yes Take 1 tablet (80 mg total) by mouth daily. Ronnald Nian, MD Taking Active   azelastine (ASTELIN) 0.1 % nasal spray [id number] Yes Place 1-2 sprays into both nostrils 2 (two) times daily as needed (nasal drainage). Use in each nostril as directed Ronnald Nian, MD Taking Active   divalproex (DEPAKOTE ER) 500 MG 24 hr tablet [id number] Yes Take 1 tablet (500 mg total) by mouth 3 (three) times daily.  Patient taking differently: Take 500 mg by mouth daily. 3 tablets at bedtime total of 1,500mg    Ronnald Nian, MD Taking Active  Med Note Littie Deeds, Sheril Hammond A   Thu Jul 16, 2023  8:40 AM) Taking 2-3 at bedtime  finasteride (PROSCAR) 5 MG tablet [id number] Yes Take 1 tablet (5 mg total) by mouth daily. Ronnald Nian, MD Taking Active   fluticasone-salmeterol (ADVAIR DISKUS) 500-50 MCG/ACT AEPB [id number] Yes Inhale 1 puff into the lungs in the morning and at bedtime. Ronnald Nian, MD Taking Active   lamoTRIgine (LAMICTAL) 200 MG tablet [id number] Yes Take 1  tablet (200 mg total) by mouth every morning. Ronnald Nian, MD Taking Active   lisinopril-hydrochlorothiazide (ZESTORETIC) 20-12.5 MG tablet [id number] Yes Take 1 tablet by mouth every morning. Ronnald Nian, MD Taking Active   loratadine (CLARITIN) 10 MG tablet [id number] Yes Take 1 tablet by mouth daily. [provider] Taking Active   Multiple Vitamins-Minerals (MULTIVITAMIN ADULTS 50+ PO) [id number] Yes Take 1 tablet by mouth daily.  [provider] Taking Active Self  nicotine polacrilex (COMMIT) 4 MG lozenge [id number] Yes Take 4 mg by mouth as needed for smoking cessation. [provider] Taking Active   VOLTAREN 1 % GEL [id number] No Apply 2 g topically 4 (four) times daily.  Patient not taking: Reported on 07/16/2023   [provider] Not Taking Active   Med List Note Doroteo Glassman [facility] 10/16/13 1884): Pt's wife states pt has been non-compliant will all his medications. Pt claims to be taking meds regularly           Assessment/Plan:   Medication Access/Adherence -Current medication adherence strategy is sufficient -Counseled patient to restart finasteride 5mg  daily if s/sx of enlarged prostate arise, or contact PCP office to discuss alternate therapies (tamsulosin)  Hypertension: - Currently moderately controlled - Continue current regimen at this time - If BP consistently >130/80, I recommend increasing lisinopril/hydrochlorothiazide to 20/25mg  daily  Hyperlipidemia/ASCVD Risk Reduction: - Currently controlled.  - Continue current regimen and regular follow-up with providers  Follow Up Plan: None scheduled but can as needed per PCP  Lenna Gilford, PharmD, DPLA

## 2023-07-21 DIAGNOSIS — H1045 Other chronic allergic conjunctivitis: Secondary | ICD-10-CM | POA: Diagnosis not present

## 2023-07-21 DIAGNOSIS — H35373 Puckering of macula, bilateral: Secondary | ICD-10-CM | POA: Diagnosis not present

## 2023-07-21 DIAGNOSIS — H524 Presbyopia: Secondary | ICD-10-CM | POA: Diagnosis not present

## 2023-07-21 DIAGNOSIS — H35342 Macular cyst, hole, or pseudohole, left eye: Secondary | ICD-10-CM | POA: Diagnosis not present

## 2023-07-21 DIAGNOSIS — H26493 Other secondary cataract, bilateral: Secondary | ICD-10-CM | POA: Diagnosis not present

## 2023-07-30 ENCOUNTER — Telehealth (INDEPENDENT_AMBULATORY_CARE_PROVIDER_SITE_OTHER): Payer: PPO | Admitting: Family Medicine

## 2023-07-30 ENCOUNTER — Encounter: Payer: Self-pay | Admitting: Family Medicine

## 2023-07-30 VITALS — Wt 137.0 lb

## 2023-07-30 DIAGNOSIS — F3176 Bipolar disorder, in full remission, most recent episode depressed: Secondary | ICD-10-CM | POA: Diagnosis not present

## 2023-07-30 DIAGNOSIS — J0100 Acute maxillary sinusitis, unspecified: Secondary | ICD-10-CM | POA: Diagnosis not present

## 2023-07-30 DIAGNOSIS — F314 Bipolar disorder, current episode depressed, severe, without psychotic features: Secondary | ICD-10-CM | POA: Diagnosis not present

## 2023-07-30 MED ORDER — AMOXICILLIN-POT CLAVULANATE 875-125 MG PO TABS
1.0000 | ORAL_TABLET | Freq: Two times a day (BID) | ORAL | 0 refills | Status: DC
Start: 2023-07-30 — End: 2023-08-29

## 2023-07-30 NOTE — Progress Notes (Signed)
   Subjective:    Patient ID: Gregory [name], male    DOB: 1951/03/13, 72 y.o.   MRN: [id number]  HPI Documentation for virtual audio and video telecommunications through Caregility encounter: The patient was located at home. 2 patient identifiers used.  The provider was located in the office. The patient did consent to this visit and is aware of possible charges through their insurance for this visit. The other persons participating in this telemedicine service were none. Time spent on call was 5 minutes and in review of previous records >20 minutes total for counseling and coordination of care. This virtual service is not related to other E/M service within previous 7 days.  He states that for a little over a week he has had difficulty with frontal and maxillary sinus pain, dizziness, postnasal drainage, nasal congestion and upper tooth discomfort.  He has had difficulty with this in the past and was treated with Augmentin.  Recent COVID test was negative.  Review of Systems     Objective:    Physical Exam Alert and in no distress otherwise not examined.       Assessment & Plan:  Acute non-recurrent maxillary sinusitis - Plan: amoxicillin-clavulanate (AUGMENTIN) 875-125 MG tablet I discussed whether this is a residual from previous sinus infection that was not treated long enough or new occurrence.  He is to call me when he finishes this prescription if not totally back to normal.  He expressed understanding of this.

## 2023-08-11 DIAGNOSIS — M79641 Pain in right hand: Secondary | ICD-10-CM | POA: Diagnosis not present

## 2023-08-11 DIAGNOSIS — M7711 Lateral epicondylitis, right elbow: Secondary | ICD-10-CM | POA: Diagnosis not present

## 2023-08-25 ENCOUNTER — Observation Stay (HOSPITAL_COMMUNITY)
Admission: EM | Admit: 2023-08-25 | Discharge: 2023-08-29 | Disposition: A | Discharge status: Home / Self Care | Payer: PPO | Attending: Family Medicine | Admitting: Family Medicine

## 2023-08-25 ENCOUNTER — Emergency Department (HOSPITAL_COMMUNITY): Payer: PPO

## 2023-08-25 ENCOUNTER — Telehealth: Payer: Self-pay | Admitting: Family Medicine

## 2023-08-25 ENCOUNTER — Other Ambulatory Visit: Payer: Self-pay

## 2023-08-25 ENCOUNTER — Encounter (HOSPITAL_COMMUNITY): Payer: Self-pay

## 2023-08-25 DIAGNOSIS — S0633AA Contusion and laceration of cerebrum, unspecified, with loss of consciousness status unknown, initial encounter: Secondary | ICD-10-CM

## 2023-08-25 DIAGNOSIS — W19XXXA Unspecified fall, initial encounter: Secondary | ICD-10-CM | POA: Insufficient documentation

## 2023-08-25 DIAGNOSIS — J45909 Unspecified asthma, uncomplicated: Secondary | ICD-10-CM | POA: Diagnosis not present

## 2023-08-25 DIAGNOSIS — S0990XA Unspecified injury of head, initial encounter: Secondary | ICD-10-CM | POA: Diagnosis not present

## 2023-08-25 DIAGNOSIS — Z95828 Presence of other vascular implants and grafts: Secondary | ICD-10-CM

## 2023-08-25 DIAGNOSIS — Z7982 Long term (current) use of aspirin: Secondary | ICD-10-CM | POA: Diagnosis not present

## 2023-08-25 DIAGNOSIS — S066X0A Traumatic subarachnoid hemorrhage without loss of consciousness, initial encounter: Secondary | ICD-10-CM | POA: Diagnosis not present

## 2023-08-25 DIAGNOSIS — F319 Bipolar disorder, unspecified: Secondary | ICD-10-CM | POA: Diagnosis present

## 2023-08-25 DIAGNOSIS — I10 Essential (primary) hypertension: Secondary | ICD-10-CM | POA: Diagnosis present

## 2023-08-25 DIAGNOSIS — I700 Atherosclerosis of aorta: Secondary | ICD-10-CM | POA: Diagnosis not present

## 2023-08-25 DIAGNOSIS — S199XXA Unspecified injury of neck, initial encounter: Secondary | ICD-10-CM | POA: Diagnosis not present

## 2023-08-25 DIAGNOSIS — Z79899 Other long term (current) drug therapy: Secondary | ICD-10-CM | POA: Diagnosis not present

## 2023-08-25 DIAGNOSIS — Z951 Presence of aortocoronary bypass graft: Secondary | ICD-10-CM

## 2023-08-25 DIAGNOSIS — I2510 Atherosclerotic heart disease of native coronary artery without angina pectoris: Secondary | ICD-10-CM | POA: Insufficient documentation

## 2023-08-25 DIAGNOSIS — S066XAA Traumatic subarachnoid hemorrhage with loss of consciousness status unknown, initial encounter: Principal | ICD-10-CM | POA: Insufficient documentation

## 2023-08-25 DIAGNOSIS — F17210 Nicotine dependence, cigarettes, uncomplicated: Secondary | ICD-10-CM | POA: Diagnosis not present

## 2023-08-25 DIAGNOSIS — I609 Nontraumatic subarachnoid hemorrhage, unspecified: Principal | ICD-10-CM | POA: Insufficient documentation

## 2023-08-25 DIAGNOSIS — Z953 Presence of xenogenic heart valve: Secondary | ICD-10-CM

## 2023-08-25 DIAGNOSIS — S0291XA Unspecified fracture of skull, initial encounter for closed fracture: Secondary | ICD-10-CM

## 2023-08-25 DIAGNOSIS — E785 Hyperlipidemia, unspecified: Secondary | ICD-10-CM | POA: Diagnosis present

## 2023-08-25 DIAGNOSIS — R55 Syncope and collapse: Secondary | ICD-10-CM | POA: Diagnosis not present

## 2023-08-25 DIAGNOSIS — S0219XA Other fracture of base of skull, initial encounter for closed fracture: Secondary | ICD-10-CM | POA: Insufficient documentation

## 2023-08-25 DIAGNOSIS — R42 Dizziness and giddiness: Secondary | ICD-10-CM | POA: Diagnosis present

## 2023-08-25 LAB — CBC
HCT: 40.8 % (ref 39.0–52.0)
Hemoglobin: 13.3 g/dL (ref 13.0–17.0)
MCH: 30.2 pg (ref 26.0–34.0)
MCHC: 32.6 g/dL (ref 30.0–36.0)
MCV: 92.5 fL (ref 80.0–100.0)
Platelets: 117 10*3/uL — ABNORMAL LOW (ref 150–400)
RBC: 4.41 MIL/uL (ref 4.22–5.81)
RDW: 14.5 % (ref 11.5–15.5)
WBC: 8.1 10*3/uL (ref 4.0–10.5)
nRBC: 0 % (ref 0.0–0.2)

## 2023-08-25 LAB — CBG MONITORING, ED: Glucose-Capillary: 101 mg/dL — ABNORMAL HIGH (ref 70–99)

## 2023-08-25 LAB — BASIC METABOLIC PANEL
Anion gap: 10 (ref 5–15)
BUN: 23 mg/dL (ref 8–23)
CO2: 23 mmol/L (ref 22–32)
Calcium: 9 mg/dL (ref 8.9–10.3)
Chloride: 104 mmol/L (ref 98–111)
Creatinine, Ser: 1.18 mg/dL (ref 0.61–1.24)
GFR, Estimated: >60 mL/min (ref >60)
Glucose, Bld: 98 mg/dL (ref 70–99)
Potassium: 4.3 mmol/L (ref 3.5–5.1)
Sodium: 137 mmol/L (ref 135–145)

## 2023-08-25 LAB — URINALYSIS, ROUTINE W REFLEX MICROSCOPIC
Bilirubin Urine: NEGATIVE
Glucose, UA: NEGATIVE mg/dL
Hgb urine dipstick: NEGATIVE
Ketones, ur: 5 mg/dL — AB
Leukocytes,Ua: NEGATIVE
Nitrite: NEGATIVE
Protein, ur: NEGATIVE mg/dL
Specific Gravity, Urine: 1.016 (ref 1.005–1.030)
pH: 6 (ref 5.0–8.0)

## 2023-08-25 LAB — TROPONIN I (HIGH SENSITIVITY)
Troponin I (High Sensitivity): 14 ng/L (ref <18)
Troponin I (High Sensitivity): 19 ng/L — ABNORMAL HIGH (ref <18)

## 2023-08-25 MED ORDER — IOHEXOL 350 MG/ML SOLN
75.0000 mL | Freq: Once | INTRAVENOUS | Status: AC | PRN
  Administered 2023-08-25: 75 mL via INTRAVENOUS

## 2023-08-25 NOTE — ED Provider Notes (Signed)
[place] EMERGENCY DEPARTMENT AT [facility] Provider Note   CSN: [id number] Arrival date & time: 08/25/23  1528     History  No chief complaint on file.   Gregory [name] is a 72 y.o. male.  72 year old male presents after having syncopal event just prior to arrival.  Patient states became dizzy and fell down and struck the back of his head.  When he awoke he had a headache in his occipital area where he struck it.  Denies any seizure activity.  Has had no recent illnesses.  Denies any neck discomfort.  No weakness in his arms or legs at this time.  No new medications.  Denies take any blood thinners       Home Medications Prior to Admission medications   Medication Sig Start Date End Date Taking? Authorizing Provider  albuterol (VENTOLIN HFA) 108 (90 Base) MCG/ACT inhaler INHALE TWO PUFFS BY MOUTH EVERY 6 HOURS AS NEEDED FOR WHEEZING 04/16/23   Ronnald Nian, MD  amLODipine (NORVASC) 10 MG tablet Take 1 tablet (10 mg total) by mouth daily. 04/16/23   Ronnald Nian, MD  amoxicillin (AMOXIL) 500 MG capsule Take 4 capsules (2,000 mg total) by mouth once as needed for up to 1 dose. 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 07/30/23   Ronnald Nian, MD  aspirin EC 81 MG tablet Take 1 tablet (81 mg total) by mouth daily. Swallow whole. 06/02/22   [place], [name], MD  atorvastatin (LIPITOR) 80 MG tablet Take 1 tablet (80 mg total) by mouth daily. 04/16/23   Ronnald Nian, MD  azelastine (ASTELIN) 0.1 % nasal spray Place 1-2 sprays into both nostrils 2 (two) times daily as needed (nasal drainage). Use in each nostril as directed 04/16/23   Ronnald Nian, MD  divalproex (DEPAKOTE ER) 500 MG 24 hr tablet Take 1 tablet (500 mg total) by mouth 3 (three) times daily. Patient not taking: Reported on 07/30/2023  04/16/23   Ronnald Nian, MD  finasteride (PROSCAR) 5 MG tablet Take 1 tablet (5 mg total) by mouth daily. 04/16/23   Ronnald Nian, MD  fluticasone-salmeterol (ADVAIR DISKUS) 500-50 MCG/ACT AEPB Inhale 1 puff into the lungs in the morning and at bedtime. 04/16/23   Ronnald Nian, MD  lamoTRIgine (LAMICTAL) 200 MG tablet Take 1 tablet (200 mg total) by mouth every morning. 04/16/23   Ronnald Nian, MD  lisinopril-hydrochlorothiazide (ZESTORETIC) 20-12.5 MG tablet Take 1 tablet by mouth every morning. 04/16/23   Ronnald Nian, MD  loratadine (CLARITIN) 10 MG tablet Take 1 tablet by mouth daily.    [provider]  Multiple Vitamins-Minerals (MULTIVITAMIN ADULTS 50+ PO) Take 1 tablet by mouth daily.     [provider]  nicotine polacrilex (COMMIT) 4 MG lozenge Take 4 mg by mouth as needed for smoking cessation.    [provider]  VOLTAREN 1 % GEL Apply 2 g topically 4 (four) times daily. Patient not taking: Reported on 07/16/2023 05/26/23   [provider]      Allergies    Codeine and Dihydrocodeine    Review of Systems   Review of Systems  All other systems reviewed and are negative.   Physical Exam Updated Vital Signs BP (!) 145/57 (BP Location: Right Arm)   Pulse [name])  55   Temp 97.9 F (36.6 C) (Oral)   Resp 18   Ht 1.651 m (5\' 5" )   Wt 62.1 kg   SpO2 97%   BMI 22.78 kg/m  Physical Exam Vitals and nursing note reviewed.  Constitutional:      General: He is not in acute distress.    Appearance: Normal appearance. He is well-developed. He is not toxic-appearing.  HENT:     Head:   Eyes:     General: Lids are normal.     Conjunctiva/sclera: Conjunctivae normal.     Pupils: Pupils are equal, round, and reactive to light.  Neck:     Thyroid: No thyroid mass.     Trachea: No tracheal deviation.  Cardiovascular:     Rate and Rhythm: Normal rate and regular rhythm.     Heart sounds: Normal heart sounds. No murmur heard.    No gallop.   Pulmonary:     Effort: Pulmonary effort is normal. No respiratory distress.     Breath sounds: Normal breath sounds. No stridor. No decreased breath sounds, wheezing, rhonchi or rales.  Abdominal:     General: There is no distension.     Palpations: Abdomen is soft.     Tenderness: There is no abdominal tenderness. There is no rebound.  Musculoskeletal:        General: No tenderness. Normal range of motion.     Cervical back: Normal range of motion and neck supple. Muscular tenderness present. No spinous process tenderness.  Skin:    General: Skin is warm and dry.     Findings: No abrasion or rash.  Neurological:     General: No focal deficit present.     Mental Status: He is alert and oriented to person, place, and time. Mental status is at baseline.     GCS: GCS eye subscore is 4. GCS verbal subscore is 5. GCS motor subscore is 6.     Cranial Nerves: Cranial nerves are intact. No cranial nerve deficit.     Sensory: No sensory deficit.     Motor: Motor function is intact.  Psychiatric:        Attention and Perception: Attention normal.        Speech: Speech normal.        Behavior: Behavior normal.     ED Results / Procedures / Treatments   Labs (all labs ordered are listed, but only abnormal results are displayed) Labs Reviewed  CBC - Abnormal; Notable for the following components:      Result Value   Platelets 117 (*)    All other components within normal limits  URINALYSIS, ROUTINE W REFLEX MICROSCOPIC - Abnormal; Notable for the following components:   Ketones, ur 5 (*)    All other components within normal limits  CBG MONITORING, ED - Abnormal; Notable for the following components:   Glucose-Capillary 101 (*)    All other components within normal limits  BASIC METABOLIC PANEL  TROPONIN I (HIGH SENSITIVITY)    EKG EKG Interpretation Date/Time:  Tuesday August 25 2023 16:05:53 EDT Ventricular Rate:  47 PR Interval:  184 QRS Duration:  140 QT  Interval:  449 QTC Calculation: 397 R Axis:   131  Text Interpretation: Sinus bradycardia Probable left atrial enlargement Right bundle branch block Confirmed by Lorre Nick ([id number]) on 08/25/2023 5:33:25 PM  Radiology CT Head Wo Contrast  Result Date: 08/25/2023 CLINICAL DATA:  Polytrauma, blunt EXAM: CT HEAD WITHOUT CONTRAST CT CERVICAL SPINE WITHOUT CONTRAST  TECHNIQUE: Multidetector CT imaging of the head and cervical spine was performed following the standard protocol without intravenous contrast. Multiplanar CT image reconstructions of the cervical spine were also generated. RADIATION DOSE REDUCTION: This exam was performed according to the departmental dose-optimization program which includes automated exposure control, adjustment of the mA and/or kV according to patient size and/or use of iterative reconstruction technique. COMPARISON:  None Available. FINDINGS: CT HEAD FINDINGS Brain: Small to moderate volume of acute subarachnoid hemorrhage along the right greater than left cerebral convexities. No significant mass effect. No evidence of acute large vascular territory infarct, a mass lesion, midline shift or hydrocephalus. Vascular: Calcific atherosclerosis. Skull: No acute fracture. Sinuses/Orbits: Mostly clear sinuses.  No acute orbital findings. Other: No mastoid effusions. CT CERVICAL SPINE FINDINGS Alignment: Reversal the normal cervical lordosis. Mild anterolisthesis of C2 on C3. Skull base and vertebrae: No evidence of acute fracture. Soft tissues and spinal canal: No prevertebral fluid or swelling. No visible canal hematoma. Disc levels: Severe multilevel degenerative change. Upper chest: Visualized lung apices are clear. IMPRESSION: 1. Small to moderate volume of acute subarachnoid hemorrhage along the right greater than left cerebral convexities. No significant mass effect. 2. No evidence of acute fracture or traumatic malalignment in the cervical spine. Severe multilevel degenerative  change. Small to moderate findings discussed with Dr. Jeraldine Loots Via telephone at 5:06 p.m. Electronically Signed   By: Feliberto Harts M.D.   On: 08/25/2023 17:11   CT Cervical Spine Wo Contrast  Result Date: 08/25/2023 CLINICAL DATA:  Polytrauma, blunt EXAM: CT HEAD WITHOUT CONTRAST CT CERVICAL SPINE WITHOUT CONTRAST TECHNIQUE: Multidetector CT imaging of the head and cervical spine was performed following the standard protocol without intravenous contrast. Multiplanar CT image reconstructions of the cervical spine were also generated. RADIATION DOSE REDUCTION: This exam was performed according to the departmental dose-optimization program which includes automated exposure control, adjustment of the mA and/or kV according to patient size and/or use of iterative reconstruction technique. COMPARISON:  None Available. FINDINGS: CT HEAD FINDINGS Brain: Small to moderate volume of acute subarachnoid hemorrhage along the right greater than left cerebral convexities. No significant mass effect. No evidence of acute large vascular territory infarct, a mass lesion, midline shift or hydrocephalus. Vascular: Calcific atherosclerosis. Skull: No acute fracture. Sinuses/Orbits: Mostly clear sinuses.  No acute orbital findings. Other: No mastoid effusions. CT CERVICAL SPINE FINDINGS Alignment: Reversal the normal cervical lordosis. Mild anterolisthesis of C2 on C3. Skull base and vertebrae: No evidence of acute fracture. Soft tissues and spinal canal: No prevertebral fluid or swelling. No visible canal hematoma. Disc levels: Severe multilevel degenerative change. Upper chest: Visualized lung apices are clear. IMPRESSION: 1. Small to moderate volume of acute subarachnoid hemorrhage along the right greater than left cerebral convexities. No significant mass effect. 2. No evidence of acute fracture or traumatic malalignment in the cervical spine. Severe multilevel degenerative change. Small to moderate findings discussed with  Dr. Jeraldine Loots Via telephone at 5:06 p.m. Electronically Signed   By: Feliberto Harts M.D.   On: 08/25/2023 17:11    Procedures Procedures    Medications Ordered in ED Medications - No data to display  ED Course/ Medical Decision Making/ A&P                                 Medical Decision Making Amount and/or Complexity of Data Reviewed Labs: ordered. Radiology: ordered.  Risk Prescription drug management.   Patient here after  traumatic injury.  Likely from syncopal event.  Patient is EKG per interpretation shows sinus bradycardia.  Head CT showed subarachnoid hemorrhage.  Unclear if this was etiology of his syncopal event versus the injury being from trauma.  Discussed case with neurology who looked at films.  Feel that is likely traumatic but recommended CT angio of head.  9:58 PM Discussed with neurosurgery team and they feel that the subarachnoid is from trauma.  Patient's troponins noted.  Slight increase with his second value.  They recommend admission to [facility] and a repeat CT of the head tomorrow.  Patient will also need to be admitted for syncope evaluation.  Consult hospitalist team for admission  CRITICAL CARE Performed by: Toy Baker Total critical care time: 50 minutes Critical care time was exclusive of separately billable procedures and treating other patients. Critical care was necessary to treat or prevent imminent or life-threatening deterioration. Critical care was time spent personally by me on the following activities: development of treatment plan with patient and/or surrogate as well as nursing, discussions with consultants, evaluation of patient's response to treatment, examination of patient, obtaining history from patient or surrogate, ordering and performing treatments and interventions, ordering and review of laboratory studies, ordering and review of radiographic studies, pulse oximetry and re-evaluation of patient's  condition.         Final Clinical Impression(s) / ED Diagnoses Final diagnoses:  None    Rx / DC Orders ED Discharge Orders     None         Lorre Nick, MD 08/25/23 2200

## 2023-08-25 NOTE — ED Notes (Signed)
Labeled specimen container given to pt for U/A collection per MD order. [employer]

## 2023-08-25 NOTE — ED Triage Notes (Signed)
Pt arrived pov. Report was in [employer] and had a syncopal episode. Cannot remember, states woke up with manager over him. Reports he had a headache right now, no vomiting, blurred vision or any other symptoms. Patient states he was working out prior to the episode. However, he did eat today. No blood thinners. Obvious lump noted in the back of head

## 2023-08-25 NOTE — Telephone Encounter (Signed)
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 Wife wanted to know if should bring here or go to ED. Advised her to have him evaluated by ED since hit head and possible LOC they would be Better equipped to do any further testing/scans that may be needed

## 2023-08-25 NOTE — Progress Notes (Signed)
Pt presented to [facility] ED this PM after syncopal event. Neuro intact. CTH/CTA showing presumed acute diffuse tSAH/contusions. Recommend holding ASA 81mg , repeat CTH in the AM. Transfer to [facility] for syncopal workup. NSGY to follow. Call w/ questions/concerns.   Heily Carlucci Margaree Mackintosh, PA-C

## 2023-08-26 ENCOUNTER — Observation Stay (HOSPITAL_COMMUNITY): Payer: PPO

## 2023-08-26 ENCOUNTER — Encounter (HOSPITAL_COMMUNITY): Payer: Self-pay | Admitting: Internal Medicine

## 2023-08-26 DIAGNOSIS — S06310A Contusion and laceration of right cerebrum without loss of consciousness, initial encounter: Secondary | ICD-10-CM | POA: Diagnosis not present

## 2023-08-26 DIAGNOSIS — J45909 Unspecified asthma, uncomplicated: Secondary | ICD-10-CM | POA: Diagnosis not present

## 2023-08-26 DIAGNOSIS — S0633AA Contusion and laceration of cerebrum, unspecified, with loss of consciousness status unknown, initial encounter: Secondary | ICD-10-CM

## 2023-08-26 DIAGNOSIS — W19XXXA Unspecified fall, initial encounter: Secondary | ICD-10-CM | POA: Diagnosis not present

## 2023-08-26 DIAGNOSIS — Z7982 Long term (current) use of aspirin: Secondary | ICD-10-CM | POA: Diagnosis not present

## 2023-08-26 DIAGNOSIS — E785 Hyperlipidemia, unspecified: Secondary | ICD-10-CM | POA: Diagnosis not present

## 2023-08-26 DIAGNOSIS — F3170 Bipolar disorder, currently in remission, most recent episode unspecified: Secondary | ICD-10-CM | POA: Diagnosis not present

## 2023-08-26 DIAGNOSIS — S0219XA Other fracture of base of skull, initial encounter for closed fracture: Secondary | ICD-10-CM | POA: Insufficient documentation

## 2023-08-26 DIAGNOSIS — R42 Dizziness and giddiness: Secondary | ICD-10-CM | POA: Diagnosis present

## 2023-08-26 DIAGNOSIS — S066XAA Traumatic subarachnoid hemorrhage with loss of consciousness status unknown, initial encounter: Secondary | ICD-10-CM | POA: Diagnosis present

## 2023-08-26 DIAGNOSIS — S066X1A Traumatic subarachnoid hemorrhage with loss of consciousness of 30 minutes or less, initial encounter: Secondary | ICD-10-CM

## 2023-08-26 DIAGNOSIS — S0291XA Unspecified fracture of skull, initial encounter for closed fracture: Secondary | ICD-10-CM

## 2023-08-26 DIAGNOSIS — I10 Essential (primary) hypertension: Secondary | ICD-10-CM

## 2023-08-26 DIAGNOSIS — Z951 Presence of aortocoronary bypass graft: Secondary | ICD-10-CM | POA: Diagnosis not present

## 2023-08-26 DIAGNOSIS — I609 Nontraumatic subarachnoid hemorrhage, unspecified: Secondary | ICD-10-CM | POA: Diagnosis not present

## 2023-08-26 DIAGNOSIS — F17210 Nicotine dependence, cigarettes, uncomplicated: Secondary | ICD-10-CM | POA: Diagnosis not present

## 2023-08-26 DIAGNOSIS — I2510 Atherosclerotic heart disease of native coronary artery without angina pectoris: Secondary | ICD-10-CM | POA: Diagnosis not present

## 2023-08-26 DIAGNOSIS — Z79899 Other long term (current) drug therapy: Secondary | ICD-10-CM | POA: Diagnosis not present

## 2023-08-26 MED ORDER — ONDANSETRON HCL 4 MG PO TABS: 4.0000 mg | ORAL_TABLET | Freq: Four times a day (QID) | ORAL | Status: DC | PRN

## 2023-08-26 MED ORDER — ACETAMINOPHEN 500 MG PO TABS
1000.0000 mg | ORAL_TABLET | Freq: Once | ORAL | Status: AC
  Administered 2023-08-26: 1000 mg via ORAL
  Filled 2023-08-26: qty 2

## 2023-08-26 MED ORDER — CEPHALEXIN 500 MG PO CAPS
500.0000 mg | ORAL_CAPSULE | Freq: Two times a day (BID) | ORAL | Status: DC
  Administered 2023-08-26 – 2023-08-29 (×8): 500 mg via ORAL
  Filled 2023-08-26 (×9): qty 1

## 2023-08-26 MED ORDER — ACETAMINOPHEN 650 MG RE SUPP: 650.0000 mg | Freq: Four times a day (QID) | RECTAL | Status: DC | PRN

## 2023-08-26 MED ORDER — LISINOPRIL-HYDROCHLOROTHIAZIDE 20-12.5 MG PO TABS: 1.0000 | ORAL_TABLET | Freq: Every morning | ORAL | Status: DC

## 2023-08-26 MED ORDER — ONDANSETRON HCL 4 MG/2ML IJ SOLN: 4.0000 mg | Freq: Four times a day (QID) | INTRAMUSCULAR | Status: DC | PRN

## 2023-08-26 MED ORDER — MELATONIN 5 MG PO TABS: 10.0000 mg | ORAL_TABLET | Freq: Every evening | ORAL | Status: DC | PRN

## 2023-08-26 MED ORDER — LISINOPRIL 20 MG PO TABS
20.0000 mg | ORAL_TABLET | Freq: Every day | ORAL | Status: DC
  Administered 2023-08-26 – 2023-08-29 (×4): 20 mg via ORAL
  Filled 2023-08-26 (×4): qty 1

## 2023-08-26 MED ORDER — FENTANYL CITRATE PF 50 MCG/ML IJ SOSY: 12.5000 ug | PREFILLED_SYRINGE | INTRAMUSCULAR | Status: DC | PRN

## 2023-08-26 MED ORDER — ACETAMINOPHEN 500 MG PO TABS
1000.0000 mg | ORAL_TABLET | Freq: Four times a day (QID) | ORAL | Status: DC | PRN
  Administered 2023-08-26 – 2023-08-28 (×6): 1000 mg via ORAL
  Filled 2023-08-26 (×6): qty 2

## 2023-08-26 MED ORDER — HYDROCHLOROTHIAZIDE 12.5 MG PO TABS
12.5000 mg | ORAL_TABLET | Freq: Every day | ORAL | Status: DC
  Administered 2023-08-26 – 2023-08-29 (×4): 12.5 mg via ORAL
  Filled 2023-08-26 (×4): qty 1

## 2023-08-26 MED ORDER — DIVALPROEX SODIUM ER 500 MG PO TB24
1000.0000 mg | ORAL_TABLET | Freq: Every evening | ORAL | Status: DC
  Administered 2023-08-26 – 2023-08-28 (×4): 1000 mg via ORAL
  Filled 2023-08-26 (×6): qty 2

## 2023-08-26 MED ORDER — FENTANYL CITRATE PF 50 MCG/ML IJ SOSY
12.5000 ug | PREFILLED_SYRINGE | Freq: Once | INTRAMUSCULAR | Status: AC
  Administered 2023-08-26: 12.5 ug via INTRAVENOUS
  Filled 2023-08-26: qty 1

## 2023-08-26 MED ORDER — AMLODIPINE BESYLATE 10 MG PO TABS
10.0000 mg | ORAL_TABLET | Freq: Every day | ORAL | Status: DC
  Administered 2023-08-26 – 2023-08-29 (×4): 10 mg via ORAL
  Filled 2023-08-26 (×4): qty 1

## 2023-08-26 MED ORDER — LAMOTRIGINE 100 MG PO TABS
200.0000 mg | ORAL_TABLET | Freq: Every morning | ORAL | Status: DC
  Administered 2023-08-26 – 2023-08-29 (×4): 200 mg via ORAL
  Filled 2023-08-26 (×4): qty 2

## 2023-08-26 MED ORDER — FINASTERIDE 5 MG PO TABS
5.0000 mg | ORAL_TABLET | Freq: Every day | ORAL | Status: DC
  Administered 2023-08-26 – 2023-08-29 (×4): 5 mg via ORAL
  Filled 2023-08-26 (×4): qty 1

## 2023-08-26 MED ORDER — ATORVASTATIN CALCIUM 80 MG PO TABS
80.0000 mg | ORAL_TABLET | Freq: Every day | ORAL | Status: DC
  Administered 2023-08-26 – 2023-08-29 (×4): 80 mg via ORAL
  Filled 2023-08-26 (×4): qty 1

## 2023-08-26 MED ORDER — MORPHINE SULFATE (PF) 2 MG/ML IV SOLN: 1.0000 mg | Freq: Once | INTRAVENOUS | Status: DC

## 2023-08-26 NOTE — Assessment & Plan Note (Signed)
Stable

## 2023-08-26 NOTE — Assessment & Plan Note (Signed)
Continue with norvasc, acei/hydrochlorothiazide.

## 2023-08-26 NOTE — TOC Initial Note (Signed)
Transition of Care [facility]) - Initial/Assessment Note    Patient Details  Name: Gregory [name] MRN: [id number] Date of Birth: 22-Sep-1951  Transition of Care [facility]) CM/SW Contact:    Kermit Balo, RN Phone Number: 08/26/2023, 3:13 PM  Clinical Narrative:                 Patient is from home with his spouse. They are together some of the time. Pt still works part- time. Wife can provide supervision at home after d/c.  Pt drives small distances. Wife can provide needed transportation. Pt manages his own medications and denies any issues.  TOC following.  Expected Discharge Plan: Home/Self Care Barriers to Discharge: Continued Medical Work up   Patient Goals and CMS Choice            Expected Discharge Plan and Services   Discharge Planning Services: CM Consult   Living arrangements for the past 2 months: Single Family Home                                      Prior Living Arrangements/Services Living arrangements for the past 2 months: Single Family Home Lives with:: Spouse Patient language and need for interpreter reviewed:: Yes Do you feel safe going back to the place where you live?: Yes        Care giver support system in place?: Yes (comment) Current home services: DME (walker/ cane) Criminal Activity/Legal Involvement Pertinent to Current Situation/Hospitalization: No - Comment as needed  Activities of Daily Living Home Assistive Devices/Equipment: Eyeglasses, Blood pressure cuff, Crutches, Walker (specify type), Brace (specify type) ADL Screening (condition at time of admission) Patient's cognitive ability adequate to safely complete daily activities?: Yes Is the patient deaf or have difficulty hearing?: Yes (right ear hearing decline) Does the patient have difficulty seeing, even when wearing glasses/contacts?: Yes (uses glasses) Does the patient have difficulty concentrating, remembering, or making decisions?: No (reports bipolar history) Patient  able to express need for assistance with ADLs?: Yes Does the patient have difficulty dressing or bathing?: No Independently performs ADLs?: Yes (appropriate for developmental age) Does the patient have difficulty walking or climbing stairs?: No Weakness of Legs: None Weakness of Arms/Hands: None  Permission Sought/Granted                  Emotional Assessment Appearance:: Appears stated age Attitude/Demeanor/Rapport: Engaged Affect (typically observed): Accepting Orientation: : Oriented to Self, Oriented to Place, Oriented to Situation, Oriented to  Time   Psych Involvement: No (comment)  Admission diagnosis:  Subarachnoid hemorrhage (HCC) [I60.9] Subarachnoid hemorrhage following injury (HCC) [S06.6XAA] Syncope, unspecified syncope type [R55] Patient Active Problem List   Diagnosis Date Noted   Subarachnoid hemorrhage following injury (HCC) 08/26/2023   Skull fracture with cerebral contusion (HCC) 08/26/2023   Temporal bone fracture (HCC) 08/26/2023   Aortic atherosclerosis (HCC) 05/26/2023   Acute non-recurrent pansinusitis 01/04/2023   Perennial allergic rhinitis 07/09/2022   Asthma-COPD overlap syndrome 04/21/2022   Recurrent infections 04/21/2022   S/P aortobifemoral bypass surgery 04/03/2021   S/P CABG x 1 11/24/2019   S/P aortic valve replacement with bioprosthetic valve 11/24/2019   PAD (peripheral artery disease) (HCC)    Mild persistent asthma 10/29/2019   RBBB (right bundle branch block with left anterior fascicular block)    Peripheral neuropathy    S/P lumbar spinal fusion 10/25/2014   Recovering alcoholic in remission (HCC) 08/10/2014  Substance induced mood disorder (HCC) 06/30/2014   Substance-induced sleep disorder (HCC) 06/30/2014   Obsessive compulsive disorder    OSA (obstructive sleep apnea) 01/20/2014   CAD (coronary artery disease)    Mentally disabled 11/25/2012   Hyperlipidemia with target LDL less than 130 11/25/2012   Bipolar disorder  (HCC)    Hypertension    Allergic rhinitis due to pollen 08/11/2011   PCP:  Ronnald Nian, MD Pharmacy:   [facility]) [facility] - [place], [place] - [address] [name] [address] Phone: [phone] Fax: [phone]  [facility] [id number] [place], [place] - [address] [place] [address] Phone: [phone] Fax: [phone]     Social Determinants of Health (SDOH) Social History: SDOH Screenings   Food Insecurity: No Food Insecurity (08/26/2023)  Housing: Low Risk  (08/26/2023)  Transportation Needs: No Transportation Needs (08/26/2023)  Utilities: Not At Risk (08/26/2023)  Alcohol Screen: Low Risk  (02/12/2023)  Depression (PHQ2-9): Low Risk  (04/14/2023)  Financial Resource Strain: Low Risk  (04/10/2023)  Physical Activity: Sufficiently Active (04/10/2023)  Social Connections: Unknown (04/10/2023)  Stress: Stress Concern Present (04/10/2023)  Tobacco Use: High Risk (08/25/2023)   SDOH Interventions:     Readmission Risk Interventions     No data to display

## 2023-08-26 NOTE — Assessment & Plan Note (Addendum)
Admit to med/tele at [facility]. Neurosurgery to see in consult. Repeat CT head in the AM per neurosurgery. Hold ASA. SCDs for DVT prophylaxis

## 2023-08-26 NOTE — Assessment & Plan Note (Addendum)
Extension into right mastoid air cells. Will place on prophylactic keflex. Pt with hx of recurrent sinusitis.

## 2023-08-26 NOTE — Assessment & Plan Note (Signed)
Continue with depakote and lamictal.

## 2023-08-26 NOTE — H&P (Signed)
History and Physical    BAYLOR KRITZER ZOX:[id number] DOB: 09/24/1951 DOA: 08/25/2023  DOS: the patient was seen and examined on 08/26/2023  PCP: Ronnald Nian, MD   Patient coming from:  [employer]  I have personally briefly reviewed patient's old medical records in [facility]  CC: fall HPI: 72 year old male history of hypertension, coronary disease status post one-vessel CABG, status post aortic valve replacement with bioprosthetic valve, history of peripheral vascular disease status post aortobifem bypass, hyperlipidemia, bipolar disorder, hyperlipidemia presents to the ER today after a fall while grocery shopping.  He states that he went to [employer] after going to the gym.  He states he was only doing light weights.  He did not do any cardio.  He did not sweat a lot.  He ate some peanut butter before working out.  He was not drinking any water.  He states that he was in [employer].  He felt dizzy.  He grabbed onto the cart.  Next thing he knew he was on the floor.  He had some head pain.  Denied any aura.  Denied any palpitations.  Patient called his wife and he was brought to the ER by his wife.  Earlier last month, patient had a 2-week Zio patch placed due to some mild bradycardia that he had noted on his apple iWatch.  He had some low heart rates that were in the morning between 4 AM and 7 AM while he was sleeping.  He has not had any palpitations.  On arrival to the ER, temp 97.7, heart rate 55, blood pressure 145/57 satting 97% on room air.  White count 8.1, hemoglobin 13.3, platelets of 117  UA negative  Sodium 137, potassium 4.3, chloride 104, bicarb 23, BUN 23, creatinine 1.18, glucose of 98  CT head showed small moderate volume of acute subarachnoid hemorrhage along the bilateral cerebral convexities.  Right greater than left.  No mass effect.  No acute cervical fractures.  There is multilevel degenerative changes.  Neurosurgery was  consulted by EDP.  Patient underwent CT angiogram of the head and neck  CTA showed scattered posttraumatic subarachnoid hemorrhage involving the anterior and inferior frontal convexities left greater than right.  There is a 1.9 cm hemorrhagic contusion of the left anterior frontal horn.  No mass effect.  There is also an acute fracture involving the right temporal parietal calvarium.  This extends partially into the right mastoid air cells.  No displacement.  There is a small right mastoid effusion related to his acute temporal bone fracture.  Neurosurgery recommended overnight admission to [facility] for observation and repeat CT head in the morning.  [facility] hospitalist consulted.   ED Course: CT head with SAH. CTA shows cerebral contusion. EDP has consulted neurosurgery  Review of Systems:  Review of Systems  Constitutional: Negative.   HENT: Negative.    Eyes: Negative.   Respiratory: Negative.    Cardiovascular:        Sudden syncope today  Gastrointestinal: Negative.   Genitourinary: Negative.   Musculoskeletal: Negative.   Skin: Negative.   Neurological:  Positive for headaches.       Right TMJ clicks  Endo/Heme/Allergies: Negative.   Psychiatric/Behavioral: Negative.    All other systems reviewed and are negative.   Past Medical History:  Diagnosis Date   Alcohol abuse    Aortic stenosis    Arthritis    Asthma    exercise indused or pollen-uses inhaler  dail yand has rescue inhaler if needed   Bipolar disorder (HCC)    CAD (coronary artery disease)    coronary calcifications 08/2013 CTA   Carotid artery occlusion    Cataract    bilateral sx   Chronic back pain    Claudication (HCC)    Complication of anesthesia 10/16/2020   "had a panic attack when the mask was put on me"" I do not think I was given anything  before, I need something to calm me down"   Depression    Family history of skin cancer    Heart murmur    as an infant   Hepatitis    h/o  1970,doesn't remember which type,1990 epstain-barr   Herpes zoster without complication 09/05/2019   Hyperlipidemia    Hypertension    Neuromuscular disorder (HCC)    Obsessive compulsive disorder    PAD (peripheral artery disease) (HCC)    Peripheral neuropathy    Pneumonia    RBBB    RBBB (right bundle branch block with left anterior fascicular block)    Seasonal allergies    Severe aortic stenosis    Sleep apnea    does not wear CPAP   Smoker    Spinal stenosis at L4-L5 level    Tobacco abuse    Tobacco use disorder     Past Surgical History:  Procedure Laterality Date   ANAL FISTULECTOMY  09/25/2011   AORTA - BILATERAL FEMORAL ARTERY BYPASS GRAFT N/A 10/03/2020   Procedure: AORTA BIFEMORAL BYPASS GRAFT USING A HEMASHIELD GOLD BIFURCATED 14 X 7mm GRAFT AND INFERIOR MESENTERIC ARTERY REIMPLANTATION;  Surgeon: Nada Libman, MD;  Location: [facility] OR;  Service: Vascular;  Laterality: N/A;   AORTIC VALVE REPLACEMENT N/A 11/24/2019   Procedure: AORTIC VALVE REPLACEMENT (AVR) using INSPIRIS Resilia 23 MM Bioprosthetic Aortic Valve.;  Surgeon: Alleen Borne, MD;  Location: [facility] OR;  Service: Open Heart Surgery;  Laterality: N/A;   APPLICATION OF WOUND VAC Left 10/17/2020   Procedure: APPLICATION OF WOUND VAC;  Surgeon: Nada Libman, MD;  Location: [facility] OR;  Service: Vascular;  Laterality: Left;   BACK SURGERY     2015   CATARACT EXTRACTION, BILATERAL  2022   COLONOSCOPY  2009   MS-F/V-mov(exc)-HPP   CORONARY ARTERY BYPASS GRAFT N/A 11/24/2019   Procedure: CORONARY ARTERY BYPASS GRAFTING (CABG) using LIMA to LAD.;  Surgeon: Alleen Borne, MD;  Location: [facility] OR;  Service: Open Heart Surgery;  Laterality: N/A;   ELBOW SURGERY     bilaterally for cubital tunnel   ENDARTERECTOMY N/A 10/03/2020   Procedure: AORTIC ENDARTERECTOMY;  Surgeon: Nada Libman, MD;  Location: [facility] OR;  Service: Vascular;  Laterality: N/A;   ENDARTERECTOMY FEMORAL Bilateral 10/03/2020   Procedure:  BILATERAL FEMORAL ENDARTERECTOMY;  Surgeon: Nada Libman, MD;  Location: [facility] OR;  Service: Vascular;  Laterality: Bilateral;   HERNIA REPAIR     with mesh   INCISION AND DRAINAGE Left 01/02/2021   Procedure: INCISION AND DRAINAGE LEFT GROIN WITH STIMULAN BEADS;  Surgeon: Nada Libman, MD;  Location: [facility] OR;  Service: Vascular;  Laterality: Left;   INCISION AND DRAINAGE OF WOUND Left 10/17/2020   Procedure: EXPLORATION LEFT GROIN WOUND;  Surgeon: Nada Libman, MD;  Location: [facility] OR;  Service: Vascular;  Laterality: Left;   MAXIMUM ACCESS (MAS)POSTERIOR LUMBAR INTERBODY FUSION (PLIF) 1 LEVEL N/A 10/25/2014   Procedure: LUMBAR FOUR TO FIVE MAXIMUM ACCESS (MAS) POSTERIOR LUMBAR INTERBODY FUSION (PLIF) 1 LEVEL;  Surgeon:  Tia Alert, MD;  Location: [facility] NEURO ORS;  Service: Neurosurgery;  Laterality: N/A;   RIGHT/LEFT HEART CATH AND CORONARY ANGIOGRAPHY N/A 11/10/2019   Procedure: RIGHT/LEFT HEART CATH AND CORONARY ANGIOGRAPHY;  Surgeon: Swaziland, [name], MD;  Location: [facility] INVASIVE CV LAB;  Service: Cardiovascular;  Laterality: N/A;   SKIN BIOPSY Left 10/12/2018   shave forehead Hypertrophic actinic kertosis with features of a verruca   TEE WITHOUT CARDIOVERSION N/A 11/24/2019   Procedure: TRANSESOPHAGEAL ECHOCARDIOGRAM (TEE);  Surgeon: Alleen Borne, MD;  Location: [facility] OR;  Service: Open Heart Surgery;  Laterality: N/A;   TONSILLECTOMY  age 73   VASECTOMY     X 2   VASECTOMY REVERSAL       reports that he has been smoking cigarettes. He has a 12.5 pack-year smoking history. He has never used smokeless tobacco. He reports that he does not currently use alcohol. He reports that he does not use drugs.  Allergies  Allergen Reactions   Codeine Anaphylaxis   Dihydrocodeine Anaphylaxis    Family History  Problem Relation Age of Onset   Colon polyps Mother 2   Heart failure Mother    Stroke Father 88   Cancer Father        skin   Drug abuse Father    Hypertension Brother     Diabetes Brother    Drug abuse Brother    Heart disease Brother        s/p CABG, pacemaker   Cancer Maternal Aunt        skin   Cancer Maternal Grandmother        liver   Colon cancer Neg Hx    Esophageal cancer Neg Hx    Rectal cancer Neg Hx    Stomach cancer Neg Hx     Prior to Admission medications   Medication Sig Start Date End Date Taking? Authorizing Provider  acetaminophen (TYLENOL) 650 MG CR tablet Take 650 mg by mouth every 8 (eight) hours as needed for pain.   Yes [provider]  albuterol (VENTOLIN HFA) 108 (90 Base) MCG/ACT inhaler INHALE TWO PUFFS BY MOUTH EVERY 6 HOURS AS NEEDED FOR WHEEZING 04/16/23  Yes Ronnald Nian, MD  amLODipine (NORVASC) 10 MG tablet Take 1 tablet (10 mg total) by mouth daily. 04/16/23  Yes Ronnald Nian, MD  amoxicillin (AMOXIL) 500 MG capsule Take 4 capsules (2,000 mg total) by mouth once as needed for up to 1 dose. Take 4 tablets one hour before dental work 06/02/22  Yes [place]az[place], [name], MD  aspirin EC 81 MG tablet Take 1 tablet (81 mg total) by mouth daily. Swallow whole. 06/02/22  Yes [place], [name], MD  atorvastatin (LIPITOR) 80 MG tablet Take 1 tablet (80 mg total) by mouth daily. 04/16/23  Yes Ronnald Nian, MD  azelastine (ASTELIN) 0.1 % nasal spray Place 1-2 sprays into both nostrils 2 (two) times daily as needed (nasal drainage). Use in each nostril as directed 04/16/23  Yes Ronnald Nian, MD  divalproex (DEPAKOTE ER) 500 MG 24 hr tablet Take 1 tablet (500 mg total) by mouth 3 (three) times daily. Patient taking differently: Take 1,000 mg by mouth every evening. 04/16/23  Yes Ronnald Nian, MD  finasteride (PROSCAR) 5 MG tablet Take 1 tablet (5 mg total) by mouth daily. 04/16/23  Yes Ronnald Nian, MD  fluticasone-salmeterol (ADVAIR DISKUS) 500-50 MCG/ACT AEPB Inhale 1 puff into the lungs in the morning and at bedtime. 04/16/23  Yes Ronnald Nian, MD  lamoTRIgine (LAMICTAL) 200 MG tablet Take 1 tablet (200 mg total) by mouth  every morning. 04/16/23  Yes Ronnald Nian, MD  lisinopril-hydrochlorothiazide (ZESTORETIC) 20-12.5 MG tablet Take 1 tablet by mouth every morning. 04/16/23  Yes Ronnald Nian, MD  Multiple Vitamins-Minerals (MULTIVITAMIN ADULTS 50+ PO) Take 1 tablet by mouth daily.    Yes [provider]  nicotine polacrilex (COMMIT) 4 MG lozenge Take 4 mg by mouth as needed for smoking cessation.   Yes [provider]  amoxicillin-clavulanate (AUGMENTIN) 875-125 MG tablet Take 1 tablet by mouth 2 (two) times daily. Patient not taking: Reported on 08/25/2023 07/30/23   Ronnald Nian, MD    Physical Exam: Vitals:   08/25/23 2130 08/25/23 2300 08/26/23 0100 08/26/23 0130  BP: (!) 162/73 (!) 155/67 (!) 159/66 (!) 146/66  Pulse: (!) 46 (!) 52 (!) 53 (!) 49  Resp: 16 16 16 16   Temp:      TempSrc:      SpO2: 96% 95% 98% 97%  Weight:      Height:        Physical Exam Vitals and nursing note reviewed.  Constitutional:      General: He is not in acute distress.    Appearance: He is normal weight. He is not ill-appearing, toxic-appearing or diaphoretic.  HENT:     Head: Normocephalic.     Comments: Right temporal/parietal junction with pain on palpation. Small scalp hematoma    Nose: Nose normal.  Eyes:     General: No scleral icterus. Cardiovascular:     Rate and Rhythm: Normal rate and regular rhythm.     Heart sounds: Murmur heard.     Systolic murmur is present with a grade of 2/6.  Pulmonary:     Effort: Pulmonary effort is normal. No respiratory distress.     Breath sounds: Normal breath sounds. No wheezing.  Abdominal:     General: Abdomen is flat. Bowel sounds are normal. There is no distension.     Palpations: Abdomen is soft.     Tenderness: There is no abdominal tenderness.  Musculoskeletal:     Right lower leg: No edema.     Left lower leg: No edema.  Skin:    General: Skin is warm and dry.     Capillary Refill: Capillary refill takes less than 2 seconds.   Neurological:     General: No focal deficit present.     Mental Status: He is alert and oriented to person, place, and time.      Labs on Admission: I have personally reviewed following labs and imaging studies  CBC: Recent Labs  Lab 08/25/23 1606  WBC 8.1  HGB 13.3  HCT 40.8  MCV 92.5  PLT 117*   Basic Metabolic Panel: Recent Labs  Lab 08/25/23 1606  NA 137  K 4.3  CL 104  CO2 23  GLUCOSE 98  BUN 23  CREATININE 1.18  CALCIUM 9.0   GFR: Estimated Creatinine Clearance: 48.5 mL/min (by C-G formula based on SCr of 1.18 mg/dL).  Cardiac Enzymes: Recent Labs  Lab 08/25/23 1542 08/25/23 1948  TROPONINIHS 14 19*   BNP (last 3 results) No results for input(s): "BNP" in the last 8760 hours. HbA1C: No results for input(s): "HGBA1C" in the last 72 hours. CBG: Recent Labs  Lab 08/25/23 1606  GLUCAP 101*   Lipid Profile: No results for input(s): "CHOL", "HDL", "LDLCALC", "TRIG", "CHOLHDL", "LDLDIRECT" in the last 72 hours.  Thyroid Function Tests: No results for input(s): "TSH", "T4TOTAL", "FREET4", "T3FREE", "THYROIDAB" in the last 72 hours. Anemia Panel: No results for input(s): "VITAMINB12", "FOLATE", "FERRITIN", "TIBC", "IRON", "RETICCTPCT" in the last 72 hours. Urine analysis:    Component Value Date/Time   COLORURINE YELLOW 08/25/2023 1606   APPEARANCEUR CLEAR 08/25/2023 1606   LABSPEC 1.016 08/25/2023 1606   PHURINE 6.0 08/25/2023 1606   GLUCOSEU NEGATIVE 08/25/2023 1606   HGBUR NEGATIVE 08/25/2023 1606   BILIRUBINUR NEGATIVE 08/25/2023 1606   BILIRUBINUR neg 05/26/2014 1551   KETONESUR 5 (A) 08/25/2023 1606   PROTEINUR NEGATIVE 08/25/2023 1606   UROBILINOGEN negative 05/26/2014 1551   UROBILINOGEN 1.0 01/20/2014 1608   NITRITE NEGATIVE 08/25/2023 1606   LEUKOCYTESUR NEGATIVE 08/25/2023 1606    Radiological Exams on Admission: I have personally reviewed images CT ANGIO HEAD NECK W WO CM  Result Date: 08/25/2023 CLINICAL DATA:  Initial  evaluation for hemorrhagic stroke. EXAM: CT ANGIOGRAPHY HEAD AND NECK WITH AND WITHOUT CONTRAST TECHNIQUE: Multidetector CT imaging of the head and neck was performed using the standard protocol during bolus administration of intravenous contrast. Multiplanar CT image reconstructions and MIPs were obtained to evaluate the vascular anatomy. Carotid stenosis measurements (when applicable) are obtained utilizing NASCET criteria, using the distal internal carotid diameter as the denominator. RADIATION DOSE REDUCTION: This exam was performed according to the departmental dose-optimization program which includes automated exposure control, adjustment of the mA and/or kV according to patient size and/or use of iterative reconstruction technique. CONTRAST:  75mL OMNIPAQUE IOHEXOL 350 MG/ML SOLN COMPARISON:  CT from earlier the same day. FINDINGS: CT HEAD FINDINGS Brain: Scattered posttraumatic subarachnoid hemorrhage involving the anterior/inferior frontal convexities, left greater than right. Superimposed evolving hemorrhagic contusion at the anterior left frontal lobe measures 1.9 cm. No significant regional mass effect at this time. No other acute intracranial hemorrhage or large vessel territory infarct. No mass lesion or midline shift. No hydrocephalus. No other extra-axial fluid collection. Vascular: No abnormal hyperdense vessel. Scattered vascular calcifications noted within the carotid siphons. Skull: There is an acute fracture involving the right temporoparietal calvarium, best seen on corresponding CTA (series 15, image 152). Extension to partially involve the right mastoid air cells (series 15, image 141). No significant displacement. Sinuses/Orbits: Globes and orbital soft tissues within normal limits. Small volume layering secretions noted within the sphenoid sinuses. Paranasal sinuses are otherwise clear. Small right mastoid effusion related to the acute temporal bone fracture. Left mastoid air cells are  clear. Other: None. Review of the MIP images confirms the above findings CTA NECK FINDINGS Aortic arch: Aortic arch within normal limits for caliber standard 3 vessel morphology. Moderate aortic atherosclerosis. No high-grade stenosis about the origin of the great vessels. Right carotid system: Right common and internal carotid arteries are patent without dissection. Moderate atheromatous change about the right carotid artery system without hemodynamically significant greater than 50% stenosis. Left carotid system: Left common and internal carotid arteries are patent without dissection. Moderate atheromatous change about the left carotid artery system without hemodynamically significant greater than 50% stenosis. Vertebral arteries: Both vertebral arteries arise from subclavian arteries. No significant proximal subclavian artery stenosis. Left vertebral artery dominant with a diffusely hypoplastic right vertebral artery. Atheromatous plaque at the origin of the right vertebral artery with moderate to severe stenosis. Contralateral left vertebral artery is heavily diseased with extensive atheromatous change. Resultant moderate to severe multifocal stenoses about the left V1 and V2 segments as well as the left V3 segment. Skeleton: Severe spondylosis noted throughout the  cervicothoracic spine. No worrisome osseous lesions. Prior sternotomy. Other neck: No other acute finding. Upper chest: Emphysema.  No other acute finding. Review of the MIP images confirms the above findings CTA HEAD FINDINGS Anterior circulation: Scattered atheromatous change about the carotid siphons with associated mild multifocal narrowing. A1 segments patent bilaterally. Normal anterior communicating complex. Anterior cerebral arteries widely patent. No M1 stenosis or occlusion. Distal MCA branches perfused and symmetric. Posterior circulation: Hypoplastic right vertebral artery terminates in PICA. Right PICA patent. Scattered plaque within the  dominant left V4 segment with associated mild stenoses. Left PICA patent. Basilar patent without stenosis. Superior cerebellar and posterior cerebral arteries patent bilaterally. Venous sinuses: Patent allowing for timing the contrast bolus. Anatomic variants: As above.  No aneurysm. Review of the MIP images confirms the above findings IMPRESSION: CT HEAD IMPRESSION: 1. Scattered posttraumatic subarachnoid hemorrhage involving the anterior/inferior frontal convexities, left greater than right. Superimposed 1.9 cm evolving hemorrhagic contusion at the anterior left frontal lobe. No significant regional mass effect at this time. 2. Acute fracture involving the right temporoparietal calvarium, with extension to partially involve the right mastoid air cells. No significant displacement. CTA HEAD AND NECK: 1. Negative CTA for large vessel occlusion or other emergent finding. 2. Moderate atheromatous change about the carotid siphons with associated mild multifocal narrowing. 3. Heavy atherosclerotic disease throughout the dominant left vertebral artery with associated moderate to severe multifocal stenoses within the neck. Moderate to severe stenosis at the origin of the hypoplastic right vertebral artery. 4. Moderate atheromatous change about the carotid artery systems within the neck without hemodynamically significant greater than 50% stenosis. Aortic Atherosclerosis (ICD10-I70.0) and Emphysema (ICD10-J43.9). Electronically Signed   By: Rise Mu M.D.   On: 08/25/2023 23:14   CT Head Wo Contrast  Result Date: 08/25/2023 CLINICAL DATA:  Polytrauma, blunt EXAM: CT HEAD WITHOUT CONTRAST CT CERVICAL SPINE WITHOUT CONTRAST TECHNIQUE: Multidetector CT imaging of the head and cervical spine was performed following the standard protocol without intravenous contrast. Multiplanar CT image reconstructions of the cervical spine were also generated. RADIATION DOSE REDUCTION: This exam was performed according to the  departmental dose-optimization program which includes automated exposure control, adjustment of the mA and/or kV according to patient size and/or use of iterative reconstruction technique. COMPARISON:  None Available. FINDINGS: CT HEAD FINDINGS Brain: Small to moderate volume of acute subarachnoid hemorrhage along the right greater than left cerebral convexities. No significant mass effect. No evidence of acute large vascular territory infarct, a mass lesion, midline shift or hydrocephalus. Vascular: Calcific atherosclerosis. Skull: No acute fracture. Sinuses/Orbits: Mostly clear sinuses.  No acute orbital findings. Other: No mastoid effusions. CT CERVICAL SPINE FINDINGS Alignment: Reversal the normal cervical lordosis. Mild anterolisthesis of C2 on C3. Skull base and vertebrae: No evidence of acute fracture. Soft tissues and spinal canal: No prevertebral fluid or swelling. No visible canal hematoma. Disc levels: Severe multilevel degenerative change. Upper chest: Visualized lung apices are clear. IMPRESSION: 1. Small to moderate volume of acute subarachnoid hemorrhage along the right greater than left cerebral convexities. No significant mass effect. 2. No evidence of acute fracture or traumatic malalignment in the cervical spine. Severe multilevel degenerative change. Small to moderate findings discussed with Dr. Jeraldine Loots Via telephone at 5:06 p.m. Electronically Signed   By: Feliberto Harts M.D.   On: 08/25/2023 17:11   CT Cervical Spine Wo Contrast  Result Date: 08/25/2023 CLINICAL DATA:  Polytrauma, blunt EXAM: CT HEAD WITHOUT CONTRAST CT CERVICAL SPINE WITHOUT CONTRAST TECHNIQUE: Multidetector CT imaging of the  head and cervical spine was performed following the standard protocol without intravenous contrast. Multiplanar CT image reconstructions of the cervical spine were also generated. RADIATION DOSE REDUCTION: This exam was performed according to the departmental dose-optimization program which  includes automated exposure control, adjustment of the mA and/or kV according to patient size and/or use of iterative reconstruction technique. COMPARISON:  None Available. FINDINGS: CT HEAD FINDINGS Brain: Small to moderate volume of acute subarachnoid hemorrhage along the right greater than left cerebral convexities. No significant mass effect. No evidence of acute large vascular territory infarct, a mass lesion, midline shift or hydrocephalus. Vascular: Calcific atherosclerosis. Skull: No acute fracture. Sinuses/Orbits: Mostly clear sinuses.  No acute orbital findings. Other: No mastoid effusions. CT CERVICAL SPINE FINDINGS Alignment: Reversal the normal cervical lordosis. Mild anterolisthesis of C2 on C3. Skull base and vertebrae: No evidence of acute fracture. Soft tissues and spinal canal: No prevertebral fluid or swelling. No visible canal hematoma. Disc levels: Severe multilevel degenerative change. Upper chest: Visualized lung apices are clear. IMPRESSION: 1. Small to moderate volume of acute subarachnoid hemorrhage along the right greater than left cerebral convexities. No significant mass effect. 2. No evidence of acute fracture or traumatic malalignment in the cervical spine. Severe multilevel degenerative change. Small to moderate findings discussed with Dr. Jeraldine Loots Via telephone at 5:06 p.m. Electronically Signed   By: Feliberto Harts M.D.   On: 08/25/2023 17:11    EKG: My personal interpretation of EKG shows: NSR, RBBB    Assessment/Plan Principal Problem:   Subarachnoid hemorrhage following injury (HCC) Active Problems:   Skull fracture with cerebral contusion (HCC)   Bipolar disorder (HCC)   Hyperlipidemia with target LDL less than 130   Hypertension   S/P CABG x 1   S/P aortic valve replacement with bioprosthetic valve   S/P aortobifemoral bypass surgery   Temporal bone fracture (HCC)    Assessment and Plan: * Subarachnoid hemorrhage following injury (HCC) Admit to med/tele  at [facility]. Neurosurgery to see in consult. Repeat CT head in the AM per neurosurgery. Hold ASA. SCDs for DVT prophylaxis  Skull fracture with cerebral contusion (HCC) Seen in CTA head. Has hemorrhagic contusion at the anterior left frontal lobe measures 1.9 cm. Pt already on depakote and lamictal for bipolar disorder. Continue both.  S/P aortobifemoral bypass surgery Stable. Holding ASA.  S/P aortic valve replacement with bioprosthetic valve Stable.  S/P CABG x 1 Stable. Holding ASA.  Hypertension Continue with norvasc, acei/hydrochlorothiazide.  Hyperlipidemia with target LDL less than 130 Continue lipitor 80 mg daily.  Bipolar disorder (HCC) Continue with depakote and lamictal.  Temporal bone fracture (HCC) Extension into right mastoid air cells. Will place on prophylactic keflex. Pt with hx of recurrent sinusitis.   DVT prophylaxis: SCDs Code Status: Full Code Family Communication: discussed with pt and wife marian at bedside  Disposition Plan: return home  Consults called: EDP has consulted neurosurgery( [place], [place]) Admission status: Observation, Telemetry bed   Carollee Herter, DO [facility] 08/26/2023, 1:47 AM

## 2023-08-26 NOTE — Consult Note (Signed)
   Providing Compassionate, Quality Care - Together  Neurosurgery Consult  Referring physician: ED Reason for referral: SAH, traumatic  Chief Complaint: Syncopal event, fall  History of Present Illness: This is a 72 year old male with a history of coronary artery disease, aortic valve replacement, hypertension, hyperlipidemia, aortobifem bypass and had a fall/syncopal event at the grocery store.  He hit the back of his head after feeling dizzy.  He was brought to the emergency department, workup revealed traumatic subarachnoid hemorrhage.  He denies any seizure-like activity, focal weakness.  Does complain of a headache that is controlled.  Denies any vision changes or focal weakness.  He does take 81 mg of aspirin daily.  Medications: I have reviewed the patient's current medications. Allergies: No Known Allergies  History reviewed. No pertinent family history. Social History:  has no history on file for tobacco use, alcohol use, and drug use.  ROS: All pertinent positives and negatives are listed HPI above  Physical Exam:  Vital signs in last 24 hours: Temp:  [98 F (36.7 C)-98.3 F (36.8 C)] 98 F (36.7 C) (07/25 1814) Pulse Rate:  [58-128] 65 (07/26 0746) Resp:  [11-18] 14 (07/26 0217) BP: (138-182)/(65-125) 153/88 (07/26 0700) SpO2:  [91 %-98 %] 96 % (07/26 0746) PE:  Awake alert oriented x 3, no acute distress PERRLA EOMI Speech fluent and appropriate Moving all extremities equally, full strength Face symmetric Silt    Impression/Assessment:  72 year old male with  Traumatic subarachnoid hemorrhage, nondisplaced skull fracture  Plan:  -CT and repeat CT reviewed, expected evolution of traumatic subarachnoid hemorrhage/contusion.  I do not recommend any acute neurosurgical intervention.  Agree with observation, -Can continue/complete Keflex due to temporal bone fracture -Can follow-up with me as needed -I extensively went over with the patient the expected  recovery.  I recommend he remain out of work for the next week.  He will stay off of aspirin for 2 weeks.  Thank you for allowing me to participate in this patient's care.  Please do not hesitate to call with questions or concerns.   Monia Pouch, DO Neurosurgeon [facility] [phone]

## 2023-08-26 NOTE — Assessment & Plan Note (Signed)
Continue lipitor 80mg daily  

## 2023-08-26 NOTE — ED Notes (Signed)
Report attempted x1. Secretary stated RN was still in report for her assignment and I was told she would call me back.

## 2023-08-26 NOTE — Progress Notes (Signed)
DAY ZERO NOTE    Gregory [name]  ZOX:[id number] DOB: August 10, 1951 DOA: 08/25/2023 PCP: Ronnald Nian, MD   Brief Narrative:  72 year old male history of hypertension, coronary disease status post one-vessel CABG, status post aortic valve replacement with bioprosthetic valve, history of peripheral vascular disease status post aortobifem bypass, hyperlipidemia, bipolar disorder, hyperlipidemia presents to the ER earlier today after a fall with noted prodrome of dizziness prior to fall. Recent history remarkable for bradycardia -previously had worn a Zio patch for 2 weeks that appears to be unremarkable per his report.  Imaging at intake consistent with scattered posttraumatic subarachnoid hemorrhages, neurosurgery consulted recommending no acute or urgent intervention, repeat imaging planned this morning per recommendations.  Repeat CT on 9/4 shows slightly increased small to moderate volume of acute subarachnoid hemorrhage with 1.8 cm inferior left frontal lobe hemorrhagic contusion without substantial mass effect.  Noted nondisplaced fracture of the right temporoparietal calvarium noted as well.  Patient currently being transferred to main campus at [facility] for neurosurgical evaluation and further discussion in regards to possible need for intervention and imaging.  Assessment & Plan:   Principal Problem:   Subarachnoid hemorrhage following injury (HCC) Active Problems:   Skull fracture with cerebral contusion (HCC)   Bipolar disorder (HCC)   Hyperlipidemia with target LDL less than 130   Hypertension   S/P CABG x 1   S/P aortic valve replacement with bioprosthetic valve   S/P aortobifemoral bypass surgery   Temporal bone fracture (HCC)  DVT prophylaxis: SCDs Start: 08/26/23 1100 Code Status:   Code Status: Full Code Family Communication: None present  Status is: Observation  Dispo: The patient is from: Home              Anticipated d/c is to: To be determined               Anticipated d/c date is: 24 to 48 hours pending neurosurgical evaluation              Patient currently not medically stable for discharge given ongoing need for neurosurgical evaluation possible intervention as well as repeat imaging  Consultants:  Neurosurgery  Procedures:  None  Antimicrobials:  None indicated  Subjective: No acute issues or events overnight  Objective: Vitals:   08/26/23 0600 08/26/23 0700 08/26/23 0800 08/26/23 0802  BP: 134/60 (!) 155/67 (!) 146/89   Pulse: (!) 44 (!) 44 (!) 49   Resp: 16 15 16    Temp:    98.1 F (36.7 C)  TempSrc:      SpO2: 92% 96% 97%   Weight:      Height:       No intake or output data in the 24 hours ending 08/26/23 0913 Filed Weights   08/25/23 1538  Weight: 62.1 kg    Examination:  General:  Pleasantly resting in bed, No acute distress. HEENT: Noted scalp hematoma and right temporal ecchymosis. Neck:  Without mass or deformity.  Trachea is midline. Lungs:  Clear to auscultate bilaterally without rhonchi, wheeze, or rales. Heart: Noted systolic murmur, PMI left superior sternal border. Abdomen:  Soft, nontender, nondistended.  Without guarding or rebound. Extremities: Without cyanosis, clubbing, edema, or obvious deformity. Skin:  Warm and dry, no erythema.  Data Reviewed: I have personally reviewed following labs and imaging studies  CBC: Recent Labs  Lab 08/25/23 1606  WBC 8.1  HGB 13.3  HCT 40.8  MCV 92.5  PLT 117*   Basic Metabolic Panel: Recent Labs  Lab  08/25/23 1606  NA 137  K 4.3  CL 104  CO2 23  GLUCOSE 98  BUN 23  CREATININE 1.18  CALCIUM 9.0   GFR: Estimated Creatinine Clearance: 48.5 mL/min (by C-G formula based on SCr of 1.18 mg/dL).  CBG: Recent Labs  Lab 08/25/23 1606  GLUCAP 101*   Radiology Studies: CT ANGIO HEAD NECK W WO CM  Result Date: 08/25/2023 CLINICAL DATA:  Initial evaluation for hemorrhagic stroke. EXAM: CT ANGIOGRAPHY HEAD AND NECK WITH AND WITHOUT CONTRAST  TECHNIQUE: Multidetector CT imaging of the head and neck was performed using the standard protocol during bolus administration of intravenous contrast. Multiplanar CT image reconstructions and MIPs were obtained to evaluate the vascular anatomy. Carotid stenosis measurements (when applicable) are obtained utilizing NASCET criteria, using the distal internal carotid diameter as the denominator. RADIATION DOSE REDUCTION: This exam was performed according to the departmental dose-optimization program which includes automated exposure control, adjustment of the mA and/or kV according to patient size and/or use of iterative reconstruction technique. CONTRAST:  75mL OMNIPAQUE IOHEXOL 350 MG/ML SOLN COMPARISON:  CT from earlier the same day. FINDINGS: CT HEAD FINDINGS Brain: Scattered posttraumatic subarachnoid hemorrhage involving the anterior/inferior frontal convexities, left greater than right. Superimposed evolving hemorrhagic contusion at the anterior left frontal lobe measures 1.9 cm. No significant regional mass effect at this time. No other acute intracranial hemorrhage or large vessel territory infarct. No mass lesion or midline shift. No hydrocephalus. No other extra-axial fluid collection. Vascular: No abnormal hyperdense vessel. Scattered vascular calcifications noted within the carotid siphons. Skull: There is an acute fracture involving the right temporoparietal calvarium, best seen on corresponding CTA (series 15, image 152). Extension to partially involve the right mastoid air cells (series 15, image 141). No significant displacement. Sinuses/Orbits: Globes and orbital soft tissues within normal limits. Small volume layering secretions noted within the sphenoid sinuses. Paranasal sinuses are otherwise clear. Small right mastoid effusion related to the acute temporal bone fracture. Left mastoid air cells are clear. Other: None. Review of the MIP images confirms the above findings CTA NECK FINDINGS Aortic  arch: Aortic arch within normal limits for caliber standard 3 vessel morphology. Moderate aortic atherosclerosis. No high-grade stenosis about the origin of the great vessels. Right carotid system: Right common and internal carotid arteries are patent without dissection. Moderate atheromatous change about the right carotid artery system without hemodynamically significant greater than 50% stenosis. Left carotid system: Left common and internal carotid arteries are patent without dissection. Moderate atheromatous change about the left carotid artery system without hemodynamically significant greater than 50% stenosis. Vertebral arteries: Both vertebral arteries arise from subclavian arteries. No significant proximal subclavian artery stenosis. Left vertebral artery dominant with a diffusely hypoplastic right vertebral artery. Atheromatous plaque at the origin of the right vertebral artery with moderate to severe stenosis. Contralateral left vertebral artery is heavily diseased with extensive atheromatous change. Resultant moderate to severe multifocal stenoses about the left V1 and V2 segments as well as the left V3 segment. Skeleton: Severe spondylosis noted throughout the cervicothoracic spine. No worrisome osseous lesions. Prior sternotomy. Other neck: No other acute finding. Upper chest: Emphysema.  No other acute finding. Review of the MIP images confirms the above findings CTA HEAD FINDINGS Anterior circulation: Scattered atheromatous change about the carotid siphons with associated mild multifocal narrowing. A1 segments patent bilaterally. Normal anterior communicating complex. Anterior cerebral arteries widely patent. No M1 stenosis or occlusion. Distal MCA branches perfused and symmetric. Posterior circulation: Hypoplastic right vertebral artery terminates in PICA.  Right PICA patent. Scattered plaque within the dominant left V4 segment with associated mild stenoses. Left PICA patent. Basilar patent without  stenosis. Superior cerebellar and posterior cerebral arteries patent bilaterally. Venous sinuses: Patent allowing for timing the contrast bolus. Anatomic variants: As above.  No aneurysm. Review of the MIP images confirms the above findings IMPRESSION: CT HEAD IMPRESSION: 1. Scattered posttraumatic subarachnoid hemorrhage involving the anterior/inferior frontal convexities, left greater than right. Superimposed 1.9 cm evolving hemorrhagic contusion at the anterior left frontal lobe. No significant regional mass effect at this time. 2. Acute fracture involving the right temporoparietal calvarium, with extension to partially involve the right mastoid air cells. No significant displacement. CTA HEAD AND NECK: 1. Negative CTA for large vessel occlusion or other emergent finding. 2. Moderate atheromatous change about the carotid siphons with associated mild multifocal narrowing. 3. Heavy atherosclerotic disease throughout the dominant left vertebral artery with associated moderate to severe multifocal stenoses within the neck. Moderate to severe stenosis at the origin of the hypoplastic right vertebral artery. 4. Moderate atheromatous change about the carotid artery systems within the neck without hemodynamically significant greater than 50% stenosis. Aortic Atherosclerosis (ICD10-I70.0) and Emphysema (ICD10-J43.9). Electronically Signed   By: Rise Mu M.D.   On: 08/25/2023 23:14   CT Head Wo Contrast  Result Date: 08/25/2023 CLINICAL DATA:  Polytrauma, blunt EXAM: CT HEAD WITHOUT CONTRAST CT CERVICAL SPINE WITHOUT CONTRAST TECHNIQUE: Multidetector CT imaging of the head and cervical spine was performed following the standard protocol without intravenous contrast. Multiplanar CT image reconstructions of the cervical spine were also generated. RADIATION DOSE REDUCTION: This exam was performed according to the departmental dose-optimization program which includes automated exposure control, adjustment of the  mA and/or kV according to patient size and/or use of iterative reconstruction technique. COMPARISON:  None Available. FINDINGS: CT HEAD FINDINGS Brain: Small to moderate volume of acute subarachnoid hemorrhage along the right greater than left cerebral convexities. No significant mass effect. No evidence of acute large vascular territory infarct, a mass lesion, midline shift or hydrocephalus. Vascular: Calcific atherosclerosis. Skull: No acute fracture. Sinuses/Orbits: Mostly clear sinuses.  No acute orbital findings. Other: No mastoid effusions. CT CERVICAL SPINE FINDINGS Alignment: Reversal the normal cervical lordosis. Mild anterolisthesis of C2 on C3. Skull base and vertebrae: No evidence of acute fracture. Soft tissues and spinal canal: No prevertebral fluid or swelling. No visible canal hematoma. Disc levels: Severe multilevel degenerative change. Upper chest: Visualized lung apices are clear. IMPRESSION: 1. Small to moderate volume of acute subarachnoid hemorrhage along the right greater than left cerebral convexities. No significant mass effect. 2. No evidence of acute fracture or traumatic malalignment in the cervical spine. Severe multilevel degenerative change. Small to moderate findings discussed with Dr. Jeraldine Loots Via telephone at 5:06 p.m. Electronically Signed   By: Feliberto Harts M.D.   On: 08/25/2023 17:11   CT Cervical Spine Wo Contrast  Result Date: 08/25/2023 CLINICAL DATA:  Polytrauma, blunt EXAM: CT HEAD WITHOUT CONTRAST CT CERVICAL SPINE WITHOUT CONTRAST TECHNIQUE: Multidetector CT imaging of the head and cervical spine was performed following the standard protocol without intravenous contrast. Multiplanar CT image reconstructions of the cervical spine were also generated. RADIATION DOSE REDUCTION: This exam was performed according to the departmental dose-optimization program which includes automated exposure control, adjustment of the mA and/or kV according to patient size and/or use of  iterative reconstruction technique. COMPARISON:  None Available. FINDINGS: CT HEAD FINDINGS Brain: Small to moderate volume of acute subarachnoid hemorrhage along the right greater than  left cerebral convexities. No significant mass effect. No evidence of acute large vascular territory infarct, a mass lesion, midline shift or hydrocephalus. Vascular: Calcific atherosclerosis. Skull: No acute fracture. Sinuses/Orbits: Mostly clear sinuses.  No acute orbital findings. Other: No mastoid effusions. CT CERVICAL SPINE FINDINGS Alignment: Reversal the normal cervical lordosis. Mild anterolisthesis of C2 on C3. Skull base and vertebrae: No evidence of acute fracture. Soft tissues and spinal canal: No prevertebral fluid or swelling. No visible canal hematoma. Disc levels: Severe multilevel degenerative change. Upper chest: Visualized lung apices are clear. IMPRESSION: 1. Small to moderate volume of acute subarachnoid hemorrhage along the right greater than left cerebral convexities. No significant mass effect. 2. No evidence of acute fracture or traumatic malalignment in the cervical spine. Severe multilevel degenerative change. Small to moderate findings discussed with Dr. Jeraldine Loots Via telephone at 5:06 p.m. Electronically Signed   By: Feliberto Harts M.D.   On: 08/25/2023 17:11        Scheduled Meds:  cephALEXin  500 mg Oral Q12H   divalproex  1,000 mg Oral QPM   Continuous Infusions:   LOS: 0 days   Time spent:  Azucena Fallen, DO [facility]  If 7PM-7AM, please contact night-coverage [email or web address]  08/26/2023, 9:13 AM

## 2023-08-26 NOTE — ED Notes (Signed)
[facility] called and he stated approximately a half hour on transport. Pt updated.

## 2023-08-26 NOTE — Subjective & Objective (Signed)
CC: fall HPI: 72 year old male history of hypertension, coronary disease status post one-vessel CABG, status post aortic valve replacement with bioprosthetic valve, history of peripheral vascular disease status post aortobifem bypass, hyperlipidemia, bipolar disorder, hyperlipidemia presents to the ER today after a fall while grocery shopping.  He states that he went to [employer] after going to the gym.  He states he was only doing light weights.  He did not do any cardio.  He did not sweat a lot.  He ate some peanut butter before working out.  He was not drinking any water.  He states that he was in [employer].  He felt dizzy.  He grabbed onto the cart.  Next thing he knew he was on the floor.  He had some head pain.  Denied any aura.  Denied any palpitations.  Patient called his wife and he was brought to the ER by his wife.  Earlier last month, patient had a 2-week Zio patch placed due to some mild bradycardia that he had noted on his apple iWatch.  He had some low heart rates that were in the morning between 4 AM and 7 AM while he was sleeping.  He has not had any palpitations.  On arrival to the ER, temp 97.7, heart rate 55, blood pressure 145/57 satting 97% on room air.  White count 8.1, hemoglobin 13.3, platelets of 117  UA negative  Sodium 137, potassium 4.3, chloride 104, bicarb 23, BUN 23, creatinine 1.18, glucose of 98  CT head showed small moderate volume of acute subarachnoid hemorrhage along the bilateral cerebral convexities.  Right greater than left.  No mass effect.  No acute cervical fractures.  There is multilevel degenerative changes.  Neurosurgery was consulted by EDP.  Patient underwent CT angiogram of the head and neck  CTA showed scattered posttraumatic subarachnoid hemorrhage involving the anterior and inferior frontal convexities left greater than right.  There is a 1.9 cm hemorrhagic contusion of the left anterior frontal horn.  No mass effect.  There is  also an acute fracture involving the right temporal parietal calvarium.  This extends partially into the right mastoid air cells.  No displacement.  There is a small right mastoid effusion related to his acute temporal bone fracture.  Neurosurgery recommended overnight admission to [facility] for observation and repeat CT head in the morning.  [facility] consulted.

## 2023-08-26 NOTE — Assessment & Plan Note (Signed)
Stable. Holding ASA.

## 2023-08-26 NOTE — Plan of Care (Signed)

## 2023-08-26 NOTE — ED Notes (Signed)
Report given to [facility], RN on 3W at [facility].

## 2023-08-26 NOTE — Assessment & Plan Note (Signed)
Seen in CTA head. Has hemorrhagic contusion at the anterior left frontal lobe measures 1.9 cm. Pt already on depakote and lamictal for bipolar disorder. Continue both.

## 2023-08-26 NOTE — ED Notes (Signed)
ED TO INPATIENT HANDOFF REPORT  Name/Age/Gender Gregory [name] 72 y.o. male  Code Status    Code Status Orders  (From admission, onward)           Start     Ordered   08/26/23 0037  Full code  Continuous       Question:  By:  Answer:  Consent: discussion documented in EHR   08/26/23 0038           Code Status History     Date Active Date Inactive Code Status Order ID Comments User Context   08/26/2023 0008 08/26/2023 0038 Full Code [id number]  Carollee Herter, DO ED   10/03/2020 1714 10/11/2020 1930 Full Code [id number]  Graceann Congress, PA-C Inpatient   11/24/2019 1317 11/30/2019 1835 Full Code [id number]  Ardelle Balls, PA-C Inpatient   11/10/2019 0822 11/10/2019 1407 Full Code [id number]  [place], [name], MD Inpatient   10/25/2014 1326 10/26/2014 1601 Full Code [id number]  Tia Alert, MD Inpatient   01/19/2014 0808 01/21/2014 1520 Full Code [id number]  Eduard Clos, MD Inpatient   10/16/2013 0138 10/16/2013 1634 Full Code [id number]  Arman Filter, NP ED   08/29/2013 0634 09/01/2013 1931 Full Code [id number]  Eduard Clos, MD Inpatient       Home/SNF/Other Home  Chief Complaint Subarachnoid hemorrhage following injury (HCC) [S06.6XAA]  Level of Care/Admitting Diagnosis ED Disposition     ED Disposition  Admit   Condition  --   Comment  Hospital Area: [facility] [[id number]]  Level of Care: Telemetry Medical [[id number]]  May place patient in observation at [facility] or [facility] if equivalent level of care is available:: No  Covid Evaluation: Asymptomatic - no recent exposure (last 10 days) testing not required  Diagnosis: Subarachnoid hemorrhage following injury [facility]) [[id number]]  Admitting Physician: Imogene Burn, ERIC [[id number]]  Attending Physician: Imogene Burn, ERIC [[id number]]          Medical History Past Medical History:  Diagnosis Date   Alcohol abuse    Aortic stenosis    Arthritis    Asthma    exercise indused or pollen-uses inhaler dail  yand has rescue inhaler if needed   Bipolar disorder (HCC)    CAD (coronary artery disease)    coronary calcifications 08/2013 CTA   Carotid artery occlusion    Cataract    bilateral sx   Chronic back pain    Claudication (HCC)    Complication of anesthesia 10/16/2020   "had a panic attack when the mask was put on me"" I do not think I was given anything  before, I need something to calm me down"   Depression    Family history of skin cancer    Heart murmur    as an infant   Hepatitis    h/o 1970,doesn't remember which type,1990 epstain-barr   Herpes zoster without complication 09/05/2019   Hyperlipidemia    Hypertension    Neuromuscular disorder (HCC)    Obsessive compulsive disorder    PAD (peripheral artery disease) (HCC)    Peripheral neuropathy    Pneumonia    RBBB    RBBB (right bundle branch block with left anterior fascicular block)    Seasonal allergies    Severe aortic stenosis    Sleep apnea    does not wear CPAP   Smoker    Spinal stenosis at L4-L5 level    Tobacco abuse    Tobacco use disorder  Allergies Allergies  Allergen Reactions   Codeine Anaphylaxis   Dihydrocodeine Anaphylaxis    IV Location/Drains/Wounds Patient Lines/Drains/Airways Status     Active Line/Drains/Airways     Name Placement date Placement time Site Days   Peripheral IV 08/25/23 18 G Anterior;Distal;Left;Upper Arm 08/25/23  1950  Arm  1            Labs/Imaging Results for orders placed or performed during the hospital encounter of 08/25/23 (from the past 48 hour(s))  Troponin I (High Sensitivity)     Status: None   Collection Time: 08/25/23  3:42 PM  Result Value Ref Range   Troponin I (High Sensitivity) 14 <18 ng/L    Comment: (NOTE) Elevated high sensitivity troponin I (hsTnI) values and significant  changes across serial measurements may suggest ACS but many other  chronic and acute conditions are known to elevate hsTnI results.  Refer to the "Links" section  for chest pain algorithms and additional  guidance. Performed at [facility], [address], [place], [place] [address]   Basic metabolic panel     Status: None   Collection Time: 08/25/23  4:06 PM  Result Value Ref Range   Sodium 137 135 - 145 mmol/L   Potassium 4.3 3.5 - 5.1 mmol/L   Chloride 104 98 - 111 mmol/L   CO2 23 22 - 32 mmol/L   Glucose, Bld 98 70 - 99 mg/dL    Comment: Glucose reference range applies only to samples taken after fasting for at least 8 hours.   BUN 23 8 - 23 mg/dL   Creatinine, Ser 6.04 0.61 - 1.24 mg/dL   Calcium 9.0 8.9 - 54.0 mg/dL   GFR, Estimated >98 >11 mL/min    Comment: (NOTE) Calculated using the CKD-EPI Creatinine Equation (2021)    Anion gap 10 5 - 15    Comment: Performed at [facility], [address], [place], [place] [address]  CBC     Status: Abnormal   Collection Time: 08/25/23  4:06 PM  Result Value Ref Range   WBC 8.1 4.0 - 10.5 K/uL   RBC 4.41 4.22 - 5.81 MIL/uL   Hemoglobin 13.3 13.0 - 17.0 g/dL   HCT 29.5 62.1 - 30.8 %   MCV 92.5 80.0 - 100.0 fL   MCH 30.2 26.0 - 34.0 pg   MCHC 32.6 30.0 - 36.0 g/dL   RDW 65.7 84.6 - 96.2 %   Platelets 117 (L) 150 - 400 K/uL   nRBC 0.0 0.0 - 0.2 %    Comment: Performed at [facility], [address], [place], [place] [address]  Urinalysis, Routine w reflex microscopic -Urine, Clean Catch     Status: Abnormal   Collection Time: 08/25/23  4:06 PM  Result Value Ref Range   Color, Urine YELLOW YELLOW   APPearance CLEAR CLEAR   Specific Gravity, Urine 1.016 1.005 - 1.030   pH 6.0 5.0 - 8.0   Glucose, UA NEGATIVE NEGATIVE mg/dL   Hgb urine dipstick NEGATIVE NEGATIVE   Bilirubin Urine NEGATIVE NEGATIVE   Ketones, ur 5 (A) NEGATIVE mg/dL   Protein, ur NEGATIVE NEGATIVE mg/dL   Nitrite NEGATIVE NEGATIVE   Leukocytes,Ua NEGATIVE NEGATIVE    Comment: Performed at [facility], [address] [place], [place]  [address]  CBG monitoring, ED     Status: Abnormal   Collection Time: 08/25/23  4:06 PM  Result Value Ref Range   Glucose-Capillary 101 (H) 70 -  99 mg/dL    Comment: Glucose reference range applies only to samples taken after fasting for at least 8 hours.  Troponin I (High Sensitivity)     Status: Abnormal   Collection Time: 08/25/23  7:48 PM  Result Value Ref Range   Troponin I (High Sensitivity) 19 (H) <18 ng/L    Comment: (NOTE) Elevated high sensitivity troponin I (hsTnI) values and significant  changes across serial measurements may suggest ACS but many other  chronic and acute conditions are known to elevate hsTnI results.  Refer to the "Links" section for chest pain algorithms and additional  guidance. Performed at [facility], [address] [place], [place] [address]    CT ANGIO HEAD NECK W WO CM  Result Date: 08/25/2023 CLINICAL DATA:  Initial evaluation for hemorrhagic stroke. EXAM: CT ANGIOGRAPHY HEAD AND NECK WITH AND WITHOUT CONTRAST TECHNIQUE: Multidetector CT imaging of the head and neck was performed using the standard protocol during bolus administration of intravenous contrast. Multiplanar CT image reconstructions and MIPs were obtained to evaluate the vascular anatomy. Carotid stenosis measurements (when applicable) are obtained utilizing NASCET criteria, using the distal internal carotid diameter as the denominator. RADIATION DOSE REDUCTION: This exam was performed according to the departmental dose-optimization program which includes automated exposure control, adjustment of the mA and/or kV according to patient size and/or use of iterative reconstruction technique. CONTRAST:  75mL OMNIPAQUE IOHEXOL 350 MG/ML SOLN COMPARISON:  CT from earlier the same day. FINDINGS: CT HEAD FINDINGS Brain: Scattered posttraumatic subarachnoid hemorrhage involving the anterior/inferior frontal convexities, left greater than right. Superimposed evolving hemorrhagic contusion  at the anterior left frontal lobe measures 1.9 cm. No significant regional mass effect at this time. No other acute intracranial hemorrhage or large vessel territory infarct. No mass lesion or midline shift. No hydrocephalus. No other extra-axial fluid collection. Vascular: No abnormal hyperdense vessel. Scattered vascular calcifications noted within the carotid siphons. Skull: There is an acute fracture involving the right temporoparietal calvarium, best seen on corresponding CTA (series 15, image 152). Extension to partially involve the right mastoid air cells (series 15, image 141). No significant displacement. Sinuses/Orbits: Globes and orbital soft tissues within normal limits. Small volume layering secretions noted within the sphenoid sinuses. Paranasal sinuses are otherwise clear. Small right mastoid effusion related to the acute temporal bone fracture. Left mastoid air cells are clear. Other: None. Review of the MIP images confirms the above findings CTA NECK FINDINGS Aortic arch: Aortic arch within normal limits for caliber standard 3 vessel morphology. Moderate aortic atherosclerosis. No high-grade stenosis about the origin of the great vessels. Right carotid system: Right common and internal carotid arteries are patent without dissection. Moderate atheromatous change about the right carotid artery system without hemodynamically significant greater than 50% stenosis. Left carotid system: Left common and internal carotid arteries are patent without dissection. Moderate atheromatous change about the left carotid artery system without hemodynamically significant greater than 50% stenosis. Vertebral arteries: Both vertebral arteries arise from subclavian arteries. No significant proximal subclavian artery stenosis. Left vertebral artery dominant with a diffusely hypoplastic right vertebral artery. Atheromatous plaque at the origin of the right vertebral artery with moderate to severe stenosis. Contralateral  left vertebral artery is heavily diseased with extensive atheromatous change. Resultant moderate to severe multifocal stenoses about the left V1 and V2 segments as well as the left V3 segment. Skeleton: Severe spondylosis noted throughout the cervicothoracic spine. No worrisome osseous lesions. Prior sternotomy. Other neck: No other acute finding. Upper chest: Emphysema.  No other acute finding. Review of the MIP images confirms the above findings CTA HEAD FINDINGS Anterior circulation: Scattered atheromatous change about the carotid siphons with associated mild multifocal narrowing. A1 segments patent bilaterally. Normal anterior communicating complex. Anterior cerebral arteries widely patent. No M1 stenosis or occlusion. Distal MCA branches perfused and symmetric. Posterior circulation: Hypoplastic right vertebral artery terminates in PICA. Right PICA patent. Scattered plaque within the dominant left V4 segment with associated mild stenoses. Left PICA patent. Basilar patent without stenosis. Superior cerebellar and posterior cerebral arteries patent bilaterally. Venous sinuses: Patent allowing for timing the contrast bolus. Anatomic variants: As above.  No aneurysm. Review of the MIP images confirms the above findings IMPRESSION: CT HEAD IMPRESSION: 1. Scattered posttraumatic subarachnoid hemorrhage involving the anterior/inferior frontal convexities, left greater than right. Superimposed 1.9 cm evolving hemorrhagic contusion at the anterior left frontal lobe. No significant regional mass effect at this time. 2. Acute fracture involving the right temporoparietal calvarium, with extension to partially involve the right mastoid air cells. No significant displacement. CTA HEAD AND NECK: 1. Negative CTA for large vessel occlusion or other emergent finding. 2. Moderate atheromatous change about the carotid siphons with associated mild multifocal narrowing. 3. Heavy atherosclerotic disease throughout the dominant left  vertebral artery with associated moderate to severe multifocal stenoses within the neck. Moderate to severe stenosis at the origin of the hypoplastic right vertebral artery. 4. Moderate atheromatous change about the carotid artery systems within the neck without hemodynamically significant greater than 50% stenosis. Aortic Atherosclerosis (ICD10-I70.0) and Emphysema (ICD10-J43.9). Electronically Signed   By: Rise Mu M.D.   On: 08/25/2023 23:14   CT Head Wo Contrast  Result Date: 08/25/2023 CLINICAL DATA:  Polytrauma, blunt EXAM: CT HEAD WITHOUT CONTRAST CT CERVICAL SPINE WITHOUT CONTRAST TECHNIQUE: Multidetector CT imaging of the head and cervical spine was performed following the standard protocol without intravenous contrast. Multiplanar CT image reconstructions of the cervical spine were also generated. RADIATION DOSE REDUCTION: This exam was performed according to the departmental dose-optimization program which includes automated exposure control, adjustment of the mA and/or kV according to patient size and/or use of iterative reconstruction technique. COMPARISON:  None Available. FINDINGS: CT HEAD FINDINGS Brain: Small to moderate volume of acute subarachnoid hemorrhage along the right greater than left cerebral convexities. No significant mass effect. No evidence of acute large vascular territory infarct, a mass lesion, midline shift or hydrocephalus. Vascular: Calcific atherosclerosis. Skull: No acute fracture. Sinuses/Orbits: Mostly clear sinuses.  No acute orbital findings. Other: No mastoid effusions. CT CERVICAL SPINE FINDINGS Alignment: Reversal the normal cervical lordosis. Mild anterolisthesis of C2 on C3. Skull base and vertebrae: No evidence of acute fracture. Soft tissues and spinal canal: No prevertebral fluid or swelling. No visible canal hematoma. Disc levels: Severe multilevel degenerative change. Upper chest: Visualized lung apices are clear. IMPRESSION: 1. Small to moderate  volume of acute subarachnoid hemorrhage along the right greater than left cerebral convexities. No significant mass effect. 2. No evidence of acute fracture or traumatic malalignment in the cervical spine. Severe multilevel degenerative change. Small to moderate findings discussed with Dr. Jeraldine Loots Via telephone at 5:06 p.m. Electronically Signed   By: Feliberto Harts M.D.   On: 08/25/2023 17:11   CT Cervical Spine Wo Contrast  Result Date: 08/25/2023 CLINICAL DATA:  Polytrauma, blunt EXAM: CT HEAD WITHOUT CONTRAST CT CERVICAL SPINE WITHOUT CONTRAST TECHNIQUE: Multidetector CT imaging of the head and cervical spine was performed following the standard protocol without intravenous contrast. Multiplanar CT image reconstructions of  the cervical spine were also generated. RADIATION DOSE REDUCTION: This exam was performed according to the departmental dose-optimization program which includes automated exposure control, adjustment of the mA and/or kV according to patient size and/or use of iterative reconstruction technique. COMPARISON:  None Available. FINDINGS: CT HEAD FINDINGS Brain: Small to moderate volume of acute subarachnoid hemorrhage along the right greater than left cerebral convexities. No significant mass effect. No evidence of acute large vascular territory infarct, a mass lesion, midline shift or hydrocephalus. Vascular: Calcific atherosclerosis. Skull: No acute fracture. Sinuses/Orbits: Mostly clear sinuses.  No acute orbital findings. Other: No mastoid effusions. CT CERVICAL SPINE FINDINGS Alignment: Reversal the normal cervical lordosis. Mild anterolisthesis of C2 on C3. Skull base and vertebrae: No evidence of acute fracture. Soft tissues and spinal canal: No prevertebral fluid or swelling. No visible canal hematoma. Disc levels: Severe multilevel degenerative change. Upper chest: Visualized lung apices are clear. IMPRESSION: 1. Small to moderate volume of acute subarachnoid hemorrhage along the  right greater than left cerebral convexities. No significant mass effect. 2. No evidence of acute fracture or traumatic malalignment in the cervical spine. Severe multilevel degenerative change. Small to moderate findings discussed with Dr. Jeraldine Loots Via telephone at 5:06 p.m. Electronically Signed   By: Feliberto Harts M.D.   On: 08/25/2023 17:11    Pending Labs Unresulted Labs (From admission, onward)    None       Vitals/Pain Today's Vitals   08/26/23 0459 08/26/23 0500 08/26/23 0517 08/26/23 0600  BP:  (!) 141/62  134/60  Pulse:  (!) 45  (!) 44  Resp:  16  16  Temp: 98 F (36.7 C)     TempSrc: Oral     SpO2:  96%  92%  Weight:      Height:      PainSc:   Asleep     Isolation Precautions No active isolations  Medications Medications  divalproex (DEPAKOTE ER) 24 hr tablet 1,000 mg (1,000 mg Oral Given 08/26/23 0051)  cephALEXin (KEFLEX) capsule 500 mg (500 mg Oral Given 08/26/23 0051)  iohexol (OMNIPAQUE) 350 MG/ML injection 75 mL (75 mLs Intravenous Contrast Given 08/25/23 2030)  acetaminophen (TYLENOL) tablet 1,000 mg (1,000 mg Oral Given 08/26/23 0051)  fentaNYL (SUBLIMAZE) injection 12.5 mcg (12.5 mcg Intravenous Given 08/26/23 0054)    Mobility walks

## 2023-08-27 ENCOUNTER — Observation Stay (HOSPITAL_COMMUNITY): Payer: PPO

## 2023-08-27 DIAGNOSIS — I609 Nontraumatic subarachnoid hemorrhage, unspecified: Secondary | ICD-10-CM | POA: Diagnosis not present

## 2023-08-27 DIAGNOSIS — S066X1A Traumatic subarachnoid hemorrhage with loss of consciousness of 30 minutes or less, initial encounter: Secondary | ICD-10-CM | POA: Diagnosis not present

## 2023-08-27 DIAGNOSIS — R930 Abnormal findings on diagnostic imaging of skull and head, not elsewhere classified: Secondary | ICD-10-CM | POA: Diagnosis not present

## 2023-08-27 DIAGNOSIS — S0219XA Other fracture of base of skull, initial encounter for closed fracture: Secondary | ICD-10-CM | POA: Diagnosis not present

## 2023-08-27 DIAGNOSIS — S0291XA Unspecified fracture of skull, initial encounter for closed fracture: Secondary | ICD-10-CM | POA: Diagnosis not present

## 2023-08-27 NOTE — Plan of Care (Signed)

## 2023-08-27 NOTE — Progress Notes (Signed)
PROGRESS NOTE    Gregory [name]  OVF:[id number] DOB: [birth year] DOA: 08/25/2023 PCP: Ronnald Nian, MD    Brief Narrative:  This 72 year old male with PMH significant for hypertension, coronary disease status post one-vessel CABG, status post aortic valve replacement with bioprosthetic valve, history of peripheral vascular disease,  status post aortobifem bypass, hyperlipidemia, bipolar disorder, hyperlipidemia presented in the ED after a fall with noted prodrome of dizziness prior to fall. There is recent history remarkable for bradycardia -previously had worn a Zio patch for 2 weeks that appears to be unremarkable per his report.  CT head showed scattered posttraumatic subarachnoid hemorrhages.  Neurosurgery was consulted recommended no acute or urgent intervention.  Repeat imaging showed slightly increased small to moderate volume of acute subarachnoid hemorrhage with 1.8 cm inferior left frontal lobe hemorrhagic contusion without substantial mass effect.  Nondisplaced fracture of the right temporoparietal calvarium noted.  Patient was admitted for further evaluation.  Assessment & Plan:   Principal Problem:   Subarachnoid hemorrhage following injury (HCC) Active Problems:   Skull fracture with cerebral contusion (HCC)   Bipolar disorder (HCC)   Hyperlipidemia with target LDL less than 130   Hypertension   S/P CABG x 1   S/P aortic valve replacement with bioprosthetic valve   S/P aortobifemoral bypass surgery   Temporal bone fracture (HCC)  Subarachnoid hemorrhage following injury [facility]): CT head showed scattered posttraumatic subarachnoid hemorrhages.  Neurosurgery consulted.  Patient transferred to [facility].   Repeat CT head  showed slightly increased small to moderate volume of acute  SAH with 1.8 cm inferior left frontal lobe hemorrhagic contusion without substantial mass effect.  Neurosurgery recommended no acute neurosurgical intervention needed.  Continue with  observation. Hold ASA. SCDs for DVT prophylaxis   Skull fracture with cerebral contusion (HCC) Seen in CTA head. Has hemorrhagic contusion at the anterior left frontal lobe measures 1.9 cm. Pt already on depakote and lamictal for bipolar disorder. Continue both.   S/P aortobifemoral bypass surgery Stable. Holding ASA.   S/P aortic valve replacement with bioprosthetic valve Stable.   S/P CABG x 1 Stable. Holding ASA.   Hypertension: Continue with norvasc,  ACEI /hydrochlorothiazide.   Hyperlipidemia with target LDL less than 130 Continue lipitor 80 mg daily.   Bipolar disorder (HCC) Continue with depakote and lamictal.   Temporal bone fracture (HCC) Extension into right mastoid air cells. Complete  prophylactic keflex. Pt with hx of recurrent sinusitis.   DVT prophylaxis: SCDs Code Status: Full code Family Communication:Wife at bed side. Disposition Plan:    Status is: Observation The patient remains OBS appropriate and will d/c before 2 midnights.   Admitted for subarachnoid hemorrhage.  Neurosurgery is consulted,  recommended conservative management.   Consultants:  Neurosurgery  Procedures: CT head  Antimicrobials:  Anti-infectives (From admission, onward)    Start     Dose/Rate Route Frequency Ordered Stop   08/26/23 0045  cephALEXin (KEFLEX) capsule 500 mg        500 mg Oral Every 12 hours 08/26/23 0041 08/30/23 2159      Subjective: Patient was seen and examined at bedside.  Overnight events noted.   Patient reports doing much better.  Asks when he can be discharged home.   Objective: Vitals:   08/27/23 0342 08/27/23 0743 08/27/23 0959 08/27/23 1126  BP: 135/67 118/66 (!) 154/63 (!) 120/59  Pulse: (!) 56 (!) 50 (!) 48 (!) 55  Resp: 16 17 19 18   Temp: 98.1 F (36.7 C) 98.2 F (  36.8 C) 98 F (36.7 C) 98.2 F (36.8 C)  TempSrc: Oral Oral Oral Oral  SpO2: 93% 94% 94% 96%  Weight:      Height:        Intake/Output Summary (Last 24 hours) at  08/27/2023 1439 Last data filed at 08/27/2023 0744 Gross per 24 hour  Intake --  Output 400 ml  Net -400 ml   Filed Weights   08/25/23 1538  Weight: 62.1 kg    Examination:  General exam: Appears calm and comfortable, deconditioned, not in any distress. Respiratory system: Clear to auscultation. Respiratory effort normal.  16 Cardiovascular system: S1 & S2 heard, RRR. No JVD, murmurs, rubs, gallops or clicks.  Gastrointestinal system: Abdomen is nondistended, soft and nontender.Normal bowel sounds heard. Central nervous system: Alert and oriented x 3. No focal neurological deficits. Extremities: No edema, no cyanosis, no clubbing Skin: No rashes, lesions or ulcers Psychiatry: Judgement and insight appear normal. Mood & affect appropriate.     Data Reviewed: I have personally reviewed following labs and imaging studies  CBC: Recent Labs  Lab 08/25/23 1606  WBC 8.1  HGB 13.3  HCT 40.8  MCV 92.5  PLT 117*   Basic Metabolic Panel: Recent Labs  Lab 08/25/23 1606  NA 137  K 4.3  CL 104  CO2 23  GLUCOSE 98  BUN 23  CREATININE 1.18  CALCIUM 9.0   GFR: Estimated Creatinine Clearance: 48.5 mL/min (by C-G formula based on SCr of 1.18 mg/dL). Liver Function Tests: No results for input(s): "AST", "ALT", "ALKPHOS", "BILITOT", "PROT", "ALBUMIN" in the last 168 hours. No results for input(s): "LIPASE", "AMYLASE" in the last 168 hours. No results for input(s): "AMMONIA" in the last 168 hours. Coagulation Profile: No results for input(s): "INR", "PROTIME" in the last 168 hours. Cardiac Enzymes: No results for input(s): "CKTOTAL", "CKMB", "CKMBINDEX", "TROPONINI" in the last 168 hours. BNP (last 3 results) No results for input(s): "PROBNP" in the last 8760 hours. HbA1C: No results for input(s): "HGBA1C" in the last 72 hours. CBG: Recent Labs  Lab 08/25/23 1606  GLUCAP 101*   Lipid Profile: No results for input(s): "CHOL", "HDL", "LDLCALC", "TRIG", "CHOLHDL",  "LDLDIRECT" in the last 72 hours. Thyroid Function Tests: No results for input(s): "TSH", "T4TOTAL", "FREET4", "T3FREE", "THYROIDAB" in the last 72 hours. Anemia Panel: No results for input(s): "VITAMINB12", "FOLATE", "FERRITIN", "TIBC", "IRON", "RETICCTPCT" in the last 72 hours. Sepsis Labs: No results for input(s): "PROCALCITON", "LATICACIDVEN" in the last 168 hours.  No results found for this or any previous visit (from the past 240 hour(s)).   Radiology Studies: CT HEAD WO CONTRAST ( )  Result Date: 08/26/2023 CLINICAL DATA:  Subarachnoid hemorrhage [facility]) EXAM: CT HEAD WITHOUT CONTRAST TECHNIQUE: Contiguous axial images were obtained from the base of the skull through the vertex without intravenous contrast. RADIATION DOSE REDUCTION: This exam was performed according to the departmental dose-optimization program which includes automated exposure control, adjustment of the mA and/or kV according to patient size and/or use of iterative reconstruction technique. COMPARISON:  CT head dated 08/25/23 FINDINGS: Brain: Slightly increased small to moderate volume of acute subarachnoid hemorrhage along bilateral cerebral convexities. Approximately 1.8 cm inferior left frontal lobe intraparenchymal hemorrhagic contusion without substantial mass effect. Probable multiple small (subcentimeter) right anterior temporal lobe hemorrhagic contusions. No midline shift. No hydrocephalus. No evidence of acute large vascular territory infarct. Vascular: No hyperdense vessel identified. Skull: Acute nondisplaced fracture involving the right temporoparietal calvarium with extension to involve the right mastoid air cells. Sinuses/Orbits: Clear  sinuses.  No acute orbital findings. Other: Small right mastoid effusion without definite temporal bone fracture. IMPRESSION: 1. Slightly increased small to moderate volume of acute subarachnoid hemorrhage along bilateral cerebral convexities. 2. Approximately 1.8 cm inferior left  frontal lobe intraparenchymal hemorrhagic contusion without substantial mass effect. Multiple small acute subcentimeter parenchymal contusion is in the anterior right temporal lobe. Findings are progressed since yesterday's head CT. 3. Acute nondisplaced fracture involving the right temporoparietal calvarium with extension to involve the right mastoid air cells. Electronically Signed   By: Feliberto Harts M.D.   On: 08/26/2023 10:29   CT ANGIO HEAD NECK W WO CM  Result Date: 08/25/2023 CLINICAL DATA:  Initial evaluation for hemorrhagic stroke. EXAM: CT ANGIOGRAPHY HEAD AND NECK WITH AND WITHOUT CONTRAST TECHNIQUE: Multidetector CT imaging of the head and neck was performed using the standard protocol during bolus administration of intravenous contrast. Multiplanar CT image reconstructions and MIPs were obtained to evaluate the vascular anatomy. Carotid stenosis measurements (when applicable) are obtained utilizing NASCET criteria, using the distal internal carotid diameter as the denominator. RADIATION DOSE REDUCTION: This exam was performed according to the departmental dose-optimization program which includes automated exposure control, adjustment of the mA and/or kV according to patient size and/or use of iterative reconstruction technique. CONTRAST:  75mL OMNIPAQUE IOHEXOL 350 MG/ML SOLN COMPARISON:  CT from earlier the same day. FINDINGS: CT HEAD FINDINGS Brain: Scattered posttraumatic subarachnoid hemorrhage involving the anterior/inferior frontal convexities, left greater than right. Superimposed evolving hemorrhagic contusion at the anterior left frontal lobe measures 1.9 cm. No significant regional mass effect at this time. No other acute intracranial hemorrhage or large vessel territory infarct. No mass lesion or midline shift. No hydrocephalus. No other extra-axial fluid collection. Vascular: No abnormal hyperdense vessel. Scattered vascular calcifications noted within the carotid siphons. Skull:  There is an acute fracture involving the right temporoparietal calvarium, best seen on corresponding CTA (series 15, image 152). Extension to partially involve the right mastoid air cells (series 15, image 141). No significant displacement. Sinuses/Orbits: Globes and orbital soft tissues within normal limits. Small volume layering secretions noted within the sphenoid sinuses. Paranasal sinuses are otherwise clear. Small right mastoid effusion related to the acute temporal bone fracture. Left mastoid air cells are clear. Other: None. Review of the MIP images confirms the above findings CTA NECK FINDINGS Aortic arch: Aortic arch within normal limits for caliber standard 3 vessel morphology. Moderate aortic atherosclerosis. No high-grade stenosis about the origin of the great vessels. Right carotid system: Right common and internal carotid arteries are patent without dissection. Moderate atheromatous change about the right carotid artery system without hemodynamically significant greater than 50% stenosis. Left carotid system: Left common and internal carotid arteries are patent without dissection. Moderate atheromatous change about the left carotid artery system without hemodynamically significant greater than 50% stenosis. Vertebral arteries: Both vertebral arteries arise from subclavian arteries. No significant proximal subclavian artery stenosis. Left vertebral artery dominant with a diffusely hypoplastic right vertebral artery. Atheromatous plaque at the origin of the right vertebral artery with moderate to severe stenosis. Contralateral left vertebral artery is heavily diseased with extensive atheromatous change. Resultant moderate to severe multifocal stenoses about the left V1 and V2 segments as well as the left V3 segment. Skeleton: Severe spondylosis noted throughout the cervicothoracic spine. No worrisome osseous lesions. Prior sternotomy. Other neck: No other acute finding. Upper chest: Emphysema.  No other  acute finding. Review of the MIP images confirms the above findings CTA HEAD FINDINGS Anterior circulation: Scattered  atheromatous change about the carotid siphons with associated mild multifocal narrowing. A1 segments patent bilaterally. Normal anterior communicating complex. Anterior cerebral arteries widely patent. No M1 stenosis or occlusion. Distal MCA branches perfused and symmetric. Posterior circulation: Hypoplastic right vertebral artery terminates in PICA. Right PICA patent. Scattered plaque within the dominant left V4 segment with associated mild stenoses. Left PICA patent. Basilar patent without stenosis. Superior cerebellar and posterior cerebral arteries patent bilaterally. Venous sinuses: Patent allowing for timing the contrast bolus. Anatomic variants: As above.  No aneurysm. Review of the MIP images confirms the above findings IMPRESSION: CT HEAD IMPRESSION: 1. Scattered posttraumatic subarachnoid hemorrhage involving the anterior/inferior frontal convexities, left greater than right. Superimposed 1.9 cm evolving hemorrhagic contusion at the anterior left frontal lobe. No significant regional mass effect at this time. 2. Acute fracture involving the right temporoparietal calvarium, with extension to partially involve the right mastoid air cells. No significant displacement. CTA HEAD AND NECK: 1. Negative CTA for large vessel occlusion or other emergent finding. 2. Moderate atheromatous change about the carotid siphons with associated mild multifocal narrowing. 3. Heavy atherosclerotic disease throughout the dominant left vertebral artery with associated moderate to severe multifocal stenoses within the neck. Moderate to severe stenosis at the origin of the hypoplastic right vertebral artery. 4. Moderate atheromatous change about the carotid artery systems within the neck without hemodynamically significant greater than 50% stenosis. Aortic Atherosclerosis (ICD10-I70.0) and Emphysema (ICD10-J43.9).  Electronically Signed   By: Rise Mu M.D.   On: 08/25/2023 23:14   CT Head Wo Contrast  Result Date: 08/25/2023 CLINICAL DATA:  Polytrauma, blunt EXAM: CT HEAD WITHOUT CONTRAST CT CERVICAL SPINE WITHOUT CONTRAST TECHNIQUE: Multidetector CT imaging of the head and cervical spine was performed following the standard protocol without intravenous contrast. Multiplanar CT image reconstructions of the cervical spine were also generated. RADIATION DOSE REDUCTION: This exam was performed according to the departmental dose-optimization program which includes automated exposure control, adjustment of the mA and/or kV according to patient size and/or use of iterative reconstruction technique. COMPARISON:  None Available. FINDINGS: CT HEAD FINDINGS Brain: Small to moderate volume of acute subarachnoid hemorrhage along the right greater than left cerebral convexities. No significant mass effect. No evidence of acute large vascular territory infarct, a mass lesion, midline shift or hydrocephalus. Vascular: Calcific atherosclerosis. Skull: No acute fracture. Sinuses/Orbits: Mostly clear sinuses.  No acute orbital findings. Other: No mastoid effusions. CT CERVICAL SPINE FINDINGS Alignment: Reversal the normal cervical lordosis. Mild anterolisthesis of C2 on C3. Skull base and vertebrae: No evidence of acute fracture. Soft tissues and spinal canal: No prevertebral fluid or swelling. No visible canal hematoma. Disc levels: Severe multilevel degenerative change. Upper chest: Visualized lung apices are clear. IMPRESSION: 1. Small to moderate volume of acute subarachnoid hemorrhage along the right greater than left cerebral convexities. No significant mass effect. 2. No evidence of acute fracture or traumatic malalignment in the cervical spine. Severe multilevel degenerative change. Small to moderate findings discussed with Dr. Jeraldine Loots Via telephone at 5:06 p.m. Electronically Signed   By: Feliberto Harts M.D.   On:  08/25/2023 17:11   CT Cervical Spine Wo Contrast  Result Date: 08/25/2023 CLINICAL DATA:  Polytrauma, blunt EXAM: CT HEAD WITHOUT CONTRAST CT CERVICAL SPINE WITHOUT CONTRAST TECHNIQUE: Multidetector CT imaging of the head and cervical spine was performed following the standard protocol without intravenous contrast. Multiplanar CT image reconstructions of the cervical spine were also generated. RADIATION DOSE REDUCTION: This exam was performed according to the departmental dose-optimization program  which includes automated exposure control, adjustment of the mA and/or kV according to patient size and/or use of iterative reconstruction technique. COMPARISON:  None Available. FINDINGS: CT HEAD FINDINGS Brain: Small to moderate volume of acute subarachnoid hemorrhage along the right greater than left cerebral convexities. No significant mass effect. No evidence of acute large vascular territory infarct, a mass lesion, midline shift or hydrocephalus. Vascular: Calcific atherosclerosis. Skull: No acute fracture. Sinuses/Orbits: Mostly clear sinuses.  No acute orbital findings. Other: No mastoid effusions. CT CERVICAL SPINE FINDINGS Alignment: Reversal the normal cervical lordosis. Mild anterolisthesis of C2 on C3. Skull base and vertebrae: No evidence of acute fracture. Soft tissues and spinal canal: No prevertebral fluid or swelling. No visible canal hematoma. Disc levels: Severe multilevel degenerative change. Upper chest: Visualized lung apices are clear. IMPRESSION: 1. Small to moderate volume of acute subarachnoid hemorrhage along the right greater than left cerebral convexities. No significant mass effect. 2. No evidence of acute fracture or traumatic malalignment in the cervical spine. Severe multilevel degenerative change. Small to moderate findings discussed with Dr. Jeraldine Loots Via telephone at 5:06 p.m. Electronically Signed   By: Feliberto Harts M.D.   On: 08/25/2023 17:11    Scheduled Meds:  amLODipine   10 mg Oral Daily   atorvastatin  80 mg Oral Daily   cephALEXin  500 mg Oral Q12H   divalproex  1,000 mg Oral QPM   finasteride  5 mg Oral Daily   hydrochlorothiazide  12.5 mg Oral Daily   lamoTRIgine  200 mg Oral q morning   lisinopril  20 mg Oral Daily   Continuous Infusions:   LOS: 0 days    Time spent: 50 mins    Willeen Niece, MD [facility]   If 7PM-7AM, please contact night-coverage

## 2023-08-27 NOTE — Plan of Care (Signed)
  Problem: Education: Goal: Knowledge of General Education information will improve Description: Including pain rating scale, medication(s)/side effects and non-pharmacologic comfort measures 08/27/2023 0648 by Charmian Muff, RN Outcome: Progressing 08/26/2023 2049 by Charmian Muff, RN Outcome: Progressing   Problem: Health Behavior/Discharge Planning: Goal: Ability to manage health-related needs will improve 08/27/2023 0648 by Charmian Muff, RN Outcome: Progressing 08/26/2023 2049 by Charmian Muff, RN Outcome: Progressing   Problem: Clinical Measurements: Goal: Ability to maintain clinical measurements within normal limits will improve 08/27/2023 0648 by Charmian Muff, RN Outcome: Progressing 08/26/2023 2049 by Charmian Muff, RN Outcome: Progressing

## 2023-08-27 NOTE — Care Management Obs Status (Signed)
MEDICARE OBSERVATION STATUS NOTIFICATION   Patient Details  Name: Gregory [name] MRN: [id number] Date of Birth: [birth year]   Medicare Observation Status Notification Given:  Yes    Kermit Balo, RN 08/27/2023, 9:58 AM

## 2023-08-27 NOTE — Progress Notes (Signed)
Patient noted to have a heart rate of 36, patient is asymptomatic and states this is his baseline, his MD is aware since dayshift. Dr. Joneen Roach made aware and no escalation needed at this time. Psychologist, educational, RN.    08/27/23 2007 08/27/23 2102  Assess: MEWS Score  Temp 98.6 F (37 C)  --   BP (!) 135/55  --   MAP (mmHg) 77  --   ECG Heart Rate  --  (!) 36  Resp 18  --   Level of Consciousness  --  Alert  SpO2 95 %  --   O2 Device Room Air  --   Assess: MEWS Score  MEWS Temp 0 0  MEWS Systolic 0 0  MEWS Pulse 0 2  MEWS RR 0 0  MEWS LOC 0 0  MEWS Score 0 2  MEWS Score Color Green Yellow  Assess: if the MEWS score is Yellow or Red  Were vital signs accurate and taken at a resting state?  --  Yes  Does the patient meet 2 or more of the SIRS criteria?  --  No  MEWS guidelines implemented   --  Yes, yellow  Treat  MEWS Interventions  --  Considered administering scheduled or prn medications/treatments as ordered  Take Vital Signs  Increase Vital Sign Frequency   --  Yellow: Q2hr x1, continue Q4hrs until patient remains green for 12hrs  Escalate  MEWS: Escalate  --  Yellow: Discuss with charge nurse and consider notifying provider and/or RRT  Notify: Charge Nurse/RN  Name of Charge Nurse/RN Notified  --  Phyl RN  Provider Notification  Provider Name/Title  --  Si Raider  Date Provider Notified  --  08/27/23  Time Provider Notified  --  2115  Method of Notification  --  Page  Notification Reason  --   (HR at 36)  Provider response  --  No new orders  Date of Provider Response  --  08/27/23  Time of Provider Response  --  2120  Assess: SIRS CRITERIA  SIRS Temperature  0 0  SIRS Pulse 0 0  SIRS Respirations  0 0  SIRS WBC 0 0  SIRS Score Sum  0 0

## 2023-08-28 ENCOUNTER — Observation Stay (HOSPITAL_COMMUNITY): Payer: PPO

## 2023-08-28 DIAGNOSIS — S0291XA Unspecified fracture of skull, initial encounter for closed fracture: Secondary | ICD-10-CM | POA: Diagnosis not present

## 2023-08-28 DIAGNOSIS — M25552 Pain in left hip: Secondary | ICD-10-CM | POA: Diagnosis not present

## 2023-08-28 DIAGNOSIS — S066XAA Traumatic subarachnoid hemorrhage with loss of consciousness status unknown, initial encounter: Secondary | ICD-10-CM | POA: Diagnosis not present

## 2023-08-28 DIAGNOSIS — S0219XA Other fracture of base of skull, initial encounter for closed fracture: Secondary | ICD-10-CM | POA: Diagnosis not present

## 2023-08-28 DIAGNOSIS — S066X1A Traumatic subarachnoid hemorrhage with loss of consciousness of 30 minutes or less, initial encounter: Secondary | ICD-10-CM | POA: Diagnosis not present

## 2023-08-28 MED ORDER — OXYCODONE HCL 5 MG PO TABS
5.0000 mg | ORAL_TABLET | Freq: Four times a day (QID) | ORAL | Status: DC | PRN
  Administered 2023-08-28 – 2023-08-29 (×3): 5 mg via ORAL
  Filled 2023-08-28 (×3): qty 1

## 2023-08-28 NOTE — Progress Notes (Signed)
PROGRESS NOTE    Gregory [name]  ZOX:[id number] DOB: 08/13/1951 DOA: 08/25/2023 PCP: Ronnald Nian, MD    Brief Narrative:  This 72 year old male with PMH significant for hypertension, coronary disease status post one-vessel CABG, status post aortic valve replacement with bioprosthetic valve, history of peripheral vascular disease,  status post aortobifem bypass, hyperlipidemia, bipolar disorder, hyperlipidemia presented in the ED after a fall with noted prodrome of dizziness prior to fall. There is recent history remarkable for bradycardia -previously had worn a Zio patch for 2 weeks that appears to be unremarkable per his report.  CT head showed scattered posttraumatic subarachnoid hemorrhages.  Neurosurgery was consulted recommended no acute or urgent intervention.  Repeat imaging showed slightly increased small to moderate volume of acute subarachnoid hemorrhage with 1.8 cm inferior left frontal lobe hemorrhagic contusion without substantial mass effect.  Nondisplaced fracture of the right temporoparietal calvarium noted.  Patient was admitted for further evaluation.  Assessment & Plan:   Principal Problem:   Subarachnoid hemorrhage following injury (HCC) Active Problems:   Skull fracture with cerebral contusion (HCC)   Bipolar disorder (HCC)   Hyperlipidemia with target LDL less than 130   Hypertension   S/P CABG x 1   S/P aortic valve replacement with bioprosthetic valve   S/P aortobifemoral bypass surgery   Temporal bone fracture (HCC)  Subarachnoid hemorrhage following injury [facility]): CT head showed scattered posttraumatic subarachnoid hemorrhages.  Neurosurgery consulted.  Patient transferred to [facility].   Repeat CT head showed slightly increased small to moderate volume of acute SAH with 1.8 cm inferior left frontal lobe hemorrhagic contusion without substantial mass effect.  Neurosurgery recommended no acute neurosurgical intervention needed.  Continue with  observation. Hold ASA. SCDs for DVT prophylaxis.   Skull fracture with cerebral contusion (HCC) Seen in CTA head. Has hemorrhagic contusion at the anterior left frontal lobe measures 1.9 cm.  Pt already on depakote and lamictal for bipolar disorder. Continue both.   S/P aortobifemoral bypass surgery Stable. Holding ASA.   S/P aortic valve replacement with bioprosthetic valve. Stable.   S/P CABG x 1 Stable. Holding ASA.   Hypertension: Continue with norvasc,  ACEI /hydrochlorothiazide.   Hyperlipidemia with target LDL less than 130 Continue lipitor 80 mg daily.   Bipolar disorder (HCC) Continue with depakote and lamictal.   Temporal bone fracture (HCC) Extension into right mastoid air cells. Complete  prophylactic keflex. Pt with hx of recurrent sinusitis.   DVT prophylaxis: SCDs Code Status: Full code Family Communication:Wife at bed side. Disposition Plan:   Status is: Observation The patient remains OBS appropriate and will d/c before 2 midnights.     Admitted for subarachnoid hemorrhage.  Neurosurgery is consulted,  recommended conservative management.  Consultants:  Neurosurgery  Procedures: CT head  Antimicrobials:  Anti-infectives (From admission, onward)    Start     Dose/Rate Route Frequency Ordered Stop   08/26/23 0045  cephALEXin (KEFLEX) capsule 500 mg        500 mg Oral Every 12 hours 08/26/23 0041 08/30/23 2159      Subjective: Patient was seen and examined at bedside.  Overnight events noted.   Patient reports doing much better.  He still reports feeling dizzy and unsteady while going to the bathroom.   Objective: Vitals:   08/27/23 2300 08/28/23 0318 08/28/23 0752 08/28/23 1210  BP: 121/60 138/63 (!) 142/70 110/65  Pulse: (!) 57 (!) 43  (!) 52  Resp: 18 18 18 18   Temp: 98.6 F (37 C)  98.7 F (37.1 C) 97.8 F (36.6 C) 98.2 F (36.8 C)  TempSrc: Oral  Oral Oral  SpO2: 95% 93% 94% 97%  Weight:  60.2 kg    Height:         Intake/Output Summary (Last 24 hours) at 08/28/2023 1430 Last data filed at 08/28/2023 1400 Gross per 24 hour  Intake 1391 ml  Output 901 ml  Net 490 ml   Filed Weights   08/25/23 1538 08/28/23 0318  Weight: 62.1 kg 60.2 kg    Examination:  General exam: Appears calm and comfortable, deconditioned, not in any distress. Respiratory system: Clear to auscultation. Respiratory effort normal. RR 16 Cardiovascular system: S1 & S2 heard, RRR. No JVD, murmurs, rubs, gallops or clicks.  Gastrointestinal system: Abdomen is nondistended, soft and nontender.Normal bowel sounds heard. Central nervous system: Alert and oriented x 3. No focal neurological deficits. Extremities: No edema, no cyanosis, no clubbing Skin: No rashes, lesions or ulcers Psychiatry: Judgement and insight appear normal. Mood & affect appropriate.     Data Reviewed: I have personally reviewed following labs and imaging studies  CBC: Recent Labs  Lab 08/25/23 1606  WBC 8.1  HGB 13.3  HCT 40.8  MCV 92.5  PLT 117*   Basic Metabolic Panel: Recent Labs  Lab 08/25/23 1606  NA 137  K 4.3  CL 104  CO2 23  GLUCOSE 98  BUN 23  CREATININE 1.18  CALCIUM 9.0   GFR: Estimated Creatinine Clearance: 47.5 mL/min (by C-G formula based on SCr of 1.18 mg/dL). Liver Function Tests: No results for input(s): "AST", "ALT", "ALKPHOS", "BILITOT", "PROT", "ALBUMIN" in the last 168 hours. No results for input(s): "LIPASE", "AMYLASE" in the last 168 hours. No results for input(s): "AMMONIA" in the last 168 hours. Coagulation Profile: No results for input(s): "INR", "PROTIME" in the last 168 hours. Cardiac Enzymes: No results for input(s): "CKTOTAL", "CKMB", "CKMBINDEX", "TROPONINI" in the last 168 hours. BNP (last 3 results) No results for input(s): "PROBNP" in the last 8760 hours. HbA1C: No results for input(s): "HGBA1C" in the last 72 hours. CBG: Recent Labs  Lab 08/25/23 1606  GLUCAP 101*   Lipid Profile: No  results for input(s): "CHOL", "HDL", "LDLCALC", "TRIG", "CHOLHDL", "LDLDIRECT" in the last 72 hours. Thyroid Function Tests: No results for input(s): "TSH", "T4TOTAL", "FREET4", "T3FREE", "THYROIDAB" in the last 72 hours. Anemia Panel: No results for input(s): "VITAMINB12", "FOLATE", "FERRITIN", "TIBC", "IRON", "RETICCTPCT" in the last 72 hours. Sepsis Labs: No results for input(s): "PROCALCITON", "LATICACIDVEN" in the last 168 hours.  No results found for this or any previous visit (from the past 240 hour(s)).   Radiology Studies: CT HEAD WO CONTRAST ( )  Result Date: 08/27/2023 CLINICAL DATA:  Subarachnoid hemorrhage [facility]) EXAM: CT HEAD WITHOUT CONTRAST TECHNIQUE: Contiguous axial images were obtained from the base of the skull through the vertex without intravenous contrast. RADIATION DOSE REDUCTION: This exam was performed according to the departmental dose-optimization program which includes automated exposure control, adjustment of the mA and/or kV according to patient size and/or use of iterative reconstruction technique. COMPARISON:  CT Head 08/26/23 FINDINGS: Brain: No significant interval change in bilateral frontal convexity subdural fluid collection measuring up to 6 mm bilaterally. Redemonstrated subarachnoid hemorrhage and hemorrhagic contusions along the bilateral anterior frontal lobes, slightly increased on the left measuring up 2.1 cm ( previously 1.9 cm), and unchanged along the right frontal lobe. Compared to prior exam there is interval increase in conspicuity of subarachnoid blood products along the bilateral parietal lobes, likely  due to redistribution. Unchanged subarachnoid blood products in the right middle cranial fossa. No evidence of intraventricular extension. No hydrocephalus. No CT evidence of an acute cortical infarct. Vascular: No hyperdense vessel or unexpected calcification. Skull: Redemonstrated acute nondisplaced fracture involving the right temporal bone (series 3,  image 21). Sinuses/Orbits: Small right mastoid effusion. No middle ear effusion. Mild mucosal thickening in the right sphenoid sinus. Bilateral lens replacement. Orbits are otherwise unremarkable. Other: None. IMPRESSION: 1. No significant interval change in bilateral frontal convexity subdural fluid collection measuring up to 6 mm. 2. Redemonstrated subarachnoid hemorrhage and hemorrhagic contusions along the bilateral anterior frontal lobes, slightly increased on the left measuring up 2.1 cm ( previously 1.9 cm), and unchanged along the right frontal lobe. Compared to prior exam there is interval increase in conspicuity of subarachnoid blood products along the bilateral parietal lobes, likely due to redistribution. No evidence of intraventricular extension. No hydrocephalus. 3. Redemonstrated acute nondisplaced fracture involving the right temporal bone. Electronically Signed   By: Lorenza Cambridge M.D.   On: 08/27/2023 17:10    Scheduled Meds:  amLODipine  10 mg Oral Daily   atorvastatin  80 mg Oral Daily   cephALEXin  500 mg Oral Q12H   divalproex  1,000 mg Oral QPM   finasteride  5 mg Oral Daily   hydrochlorothiazide  12.5 mg Oral Daily   lamoTRIgine  200 mg Oral q morning   lisinopril  20 mg Oral Daily   Continuous Infusions:   LOS: 0 days    Time spent: 35 mins    Willeen Niece, MD [facility]   If 7PM-7AM, please contact night-coverage

## 2023-08-28 NOTE — Evaluation (Signed)
Physical Therapy Evaluation  Patient Details Name: Gregory [name] MRN: [id number] DOB: February 19, 1951 Today's Date: 08/28/2023  History of Present Illness  Pt is a 72 y/o male presenting after syncopal event and fall. Imaging showed acute subarachnoid hemorrhage with 1.8 cm inferior left frontal lobe hemorrhagic contusion without substantial mass effect and nondisplaced fracture of the right temporoparietal calvarium. PMH: CABG, aortic valve replacement, HTN, HLD, bipolar   Clinical Impression  Pt admitted with above diagnosis. Pt currently with functional limitations due to the deficits listed below (see PT Problem List). At the time of PT eval pt was able to perform transfers and ambulation with gross contact guard assist to supervision for safety and RW for support. Without RW, pt unsteady and at risk for falls. Pt mentioned several times throughout session he is eager to get back to work and exercising. Recommending neuro outpatient PT to monitor functional and cognitive progress and assist pt with appropriate return to activity. Acutely, pt will benefit from acute skilled PT to increase their independence and safety with mobility to allow discharge.           If plan is discharge home, recommend the following: A little help with walking and/or transfers;Assistance with cooking/housework;Help with stairs or ramp for entrance;Assist for transportation   Can travel by private vehicle        Equipment Recommendations Rolling walker (2 wheels);BSC/3in1  Recommendations for Other Services       Functional Status Assessment Patient has had a recent decline in their functional status and demonstrates the ability to make significant improvements in function in a reasonable and predictable amount of time.     Precautions / Restrictions Precautions Precautions: Fall;Other (comment) Precaution Comments: monitor HR (brady) Restrictions Weight Bearing Restrictions: No      Mobility  Bed  Mobility Overal bed mobility: Needs Assistance Bed Mobility: Rolling, Supine to Sit, Sit to Supine Rolling: Modified independent (Device/Increase time)   Supine to sit: Supervision Sit to supine: Supervision   General bed mobility comments: Able to transition to/from EOB without assist but appears uncoordinated and effortful.    Transfers Overall transfer level: Needs assistance Equipment used: None, Rolling walker (2 wheels) Transfers: Sit to/from Stand Sit to Stand: Supervision           General transfer comment: VC's for hand placement on seated surface for safety. Pt unsteady without RW for support upon full stand. No overt LOB noted but pt required supervision for safety.    Ambulation/Gait Ambulation/Gait assistance: Contact guard assist, Supervision Gait Distance (Feet): 200 Feet Assistive device: Rolling walker (2 wheels) Gait Pattern/deviations: Step-through pattern, Decreased stride length, Decreased weight shift to left Gait velocity: Decreased Gait velocity interpretation: 1.31 - 2.62 ft/sec, indicative of limited community ambulator   General Gait Details: Initially favoring LLE due to reported L hip pain however as gait training progressed, gait pattern improved. Good use of RW, would recommend continued use. No overt LOB noted however mildly unsteady with horizontal head turns.  Stairs Stairs: Yes Stairs assistance: Contact guard assist Stair Management: Two rails, Alternating pattern, Forwards Number of Stairs: 5 General stair comments: Practice stairs in rehab gym. Pt able to complete with alternating pattern and reports no increased in pain in L hip.  Wheelchair Mobility     Tilt Bed    Modified Rankin (Stroke Patients Only)       Balance Overall balance assessment: Needs assistance Sitting-balance support: Feet supported, No upper extremity supported Sitting balance-Leahy Scale: Fair  Standing balance support: No upper extremity  supported, During functional activity, Reliant on assistive device for balance                                 Pertinent Vitals/Pain Pain Assessment Pain Assessment: Faces Faces Pain Scale: Hurts even more Pain Location: Palpation to gluteus medius and piriformis. Pain Descriptors / Indicators: Guarding, Sore Pain Intervention(s): Limited activity within patient's tolerance, Monitored during session, Repositioned    Home Living Family/patient expects to be discharged to:: Private residence Living Arrangements: Spouse/significant other Available Help at Discharge: Family;Available 24 hours/day Type of Home: House Home Access: Stairs to enter Entrance Stairs-Rails: Right Entrance Stairs-Number of Steps: 3-4   Home Layout: One level Home Equipment: Standard Walker      Prior Function Prior Level of Function : Independent/Modified Independent;Working/employed;Driving             Mobility Comments: no AD for mobility. active at baseline, rides exercise bike, walks 2-3 miles and does weight training ADLs Comments: works part time at [employer]     Extremity/Trunk Assessment   Upper Extremity Assessment Upper Extremity Assessment: Overall WFL for tasks assessed    Lower Extremity Assessment Lower Extremity Assessment: LLE deficits/detail LLE Deficits / Details: Pt reports pain in L hip/buttock up into low back. With palpation, pt reports pain is reproduced over L gluteus medius and piriformis. Performed soft tissue release and instructed in piriformis stretch (figure 4) in supine. Pt reports improvement in pain.    Cervical / Trunk Assessment Cervical / Trunk Assessment: Normal  Communication   Communication Communication: No apparent difficulties  Cognition Arousal: Alert Behavior During Therapy: WFL for tasks assessed/performed, Impulsive Overall Cognitive Status: Impaired/Different from baseline Area of Impairment: Attention, Awareness                    Current Attention Level: Selective       Awareness: Emergent   General Comments: pleasant, talkative, showing insight into deficits but does need cues to attend to tasks and use DME properly. unable to formally assess today        General Comments General comments (skin integrity, edema, etc.): Noted small wound on sacrum - sacral foam placed prophylactically    Exercises Other Exercises Other Exercises: Figure 4 stretch 3x30" LLE   Assessment/Plan    PT Assessment Patient needs continued PT services  PT Problem List Decreased strength;Decreased activity tolerance;Decreased balance;Decreased mobility;Decreased knowledge of use of DME;Decreased safety awareness;Decreased knowledge of precautions;Pain       PT Treatment Interventions DME instruction;Gait training;Stair training;Functional mobility training;Therapeutic activities;Therapeutic exercise;Balance training    PT Goals (Current goals can be found in the Care Plan section)  Acute Rehab PT Goals Patient Stated Goal: Home today, ASAP, and getting back to the gym PT Goal Formulation: With patient/family Time For Goal Achievement: 09/04/23 Potential to Achieve Goals: Good    Frequency Min 1X/week     Co-evaluation               AM-PAC PT "6 Clicks" Mobility  Outcome Measure Help needed turning from your back to your side while in a flat bed without using bedrails?: None Help needed moving from lying on your back to sitting on the side of a flat bed without using bedrails?: None Help needed moving to and from a bed to a chair (including a wheelchair)?: A Little Help needed standing up from a chair using  your arms (e.g., wheelchair or bedside chair)?: A Little Help needed to walk in hospital room?: A Little Help needed climbing 3-5 steps with a railing? : A Little 6 Click Score: 20    End of Session Equipment Utilized During Treatment: Gait belt Activity Tolerance: Patient tolerated treatment  well Patient left: in chair;with call bell/phone within reach;with family/visitor present Nurse Communication: Mobility status PT Visit Diagnosis: Unsteadiness on feet (R26.81);Pain;Other symptoms and signs involving the nervous system (R29.898) Pain - Right/Left: Left Pain - part of body: Hip    Time: 1029-1105 PT Time Calculation (min) (ACUTE ONLY): 36 min   Charges:   PT Evaluation $PT Eval Moderate Complexity: 1 Mod PT Treatments $Gait Training: 8-22 mins PT General Charges $$ ACUTE PT VISIT: 1 Visit         Conni Slipper, PT, DPT Acute Rehabilitation Services Secure Chat Preferred Office: [phone]   Marylynn Pearson 08/28/2023, 12:10 PM

## 2023-08-28 NOTE — TOC Transition Note (Signed)
Transition of Care [facility]) - CM/SW Discharge Note   Patient Details  Name: Gregory [name] MRN: [id number] Date of Birth: March 03, 1951  [facility] of [facility] CM/SW Contact:  Kermit Balo, RN Phone Number: 08/28/2023, 2:15 PM   Clinical Narrative:    Patient is discharging home with outpatient therapy through [facility]. Information on the AVS. Pt knows to call and schedule the first appointment. Walker for home delivered to the room. Pt will obtain shower chair outside the hospital setting.  Wife to provide supervision at home and transportation to home.   Final next level of care: OP Rehab Barriers to Discharge: No Barriers Identified   Patient Goals and CMS Choice   Choice offered to / list presented to : Spouse, Patient  Discharge Placement                         Discharge Plan and Services Additional resources added to the After Visit Summary for     Discharge Planning Services: CM Consult            DME Arranged: Dan Humphreys rolling DME Agency: [employer] Date DME Agency Contacted: 08/28/23   Representative spoke with at DME Agency: Zack            Social Determinants of Health (SDOH) Interventions SDOH Screenings   Food Insecurity: No Food Insecurity (08/26/2023)  Housing: Low Risk  (08/26/2023)  Transportation Needs: No Transportation Needs (08/26/2023)  Utilities: Not At Risk (08/26/2023)  Alcohol Screen: Low Risk  (02/12/2023)  Depression (PHQ2-9): Low Risk  (04/14/2023)  Financial Resource Strain: Low Risk  (04/10/2023)  Physical Activity: Sufficiently Active (04/10/2023)  Social Connections: Unknown (04/10/2023)  Stress: Stress Concern Present (04/10/2023)  Tobacco Use: High Risk (08/25/2023)     Readmission Risk Interventions     No data to display

## 2023-08-28 NOTE — Evaluation (Addendum)
Occupational Therapy Evaluation Patient Details Name: Gregory [name] MRN: [id number] DOB: 25-Aug-1951 Today's Date: 08/28/2023   History of Present Illness Pt is a 72 y/o male presenting after syncopal event and fall. Imaging showed acute subarachnoid hemorrhage with 1.8 cm inferior left frontal lobe hemorrhagic contusion without substantial mass effect and nondisplaced fracture of the right temporoparietal calvarium. PMH: CABG, aortic valve replacement, HTN, HLD, bipolar   Clinical Impression   PTA, pt lives with spouse, typically Independent and very active at baseline. Pt works part time at [employer]. Pt presents now with deficits in dynamic standing balance and pain in L hip/backside area. Pt with improvements in mobility when using RW on Supervision to CGA level and no more than Supervision for ADL completion. Unable to fully assess cognition during session though wife present did not express concerns. Pt would benefit from RW for use at home though feel OT follow up not needed at DC.   BP sitting: 143/60 BP standing: 131/47 BP post activity: 151/56 HR pre activity: 56 HR post activity: 32      If plan is discharge home, recommend the following: A little help with bathing/dressing/bathroom;Assistance with cooking/housework;Direct supervision/assist for medications management;Direct supervision/assist for financial management;Help with stairs or ramp for entrance    Functional Status Assessment  Patient has had a recent decline in their functional status and demonstrates the ability to make significant improvements in function in a reasonable and predictable amount of time.  Equipment Recommendations  Other (comment);BSC/3in1 (RW)    Recommendations for Other Services       Precautions / Restrictions Precautions Precautions: Fall;Other (comment) Precaution Comments: monitor HR (brady) Restrictions Weight Bearing Restrictions: No      Mobility Bed Mobility                General bed mobility comments: in chair on entry    Transfers Overall transfer level: Modified independent Equipment used: None, Rolling walker (2 wheels)                      Balance Overall balance assessment: Mild deficits observed, not formally tested                                         ADL either performed or assessed with clinical judgement   ADL Overall ADL's : Needs assistance/impaired Eating/Feeding: Independent   Grooming: Supervision/safety;Standing;Wash/dry face;Oral care   Upper Body Bathing: Modified independent   Lower Body Bathing: Supervison/ safety   Upper Body Dressing : Modified independent   Lower Body Dressing: Supervision/safety   Toilet Transfer: Contact guard assist;Supervision/safety;Ambulation;Rolling walker (2 wheels)   Toileting- Clothing Manipulation and Hygiene: Supervision/safety       Functional mobility during ADLs: Contact guard assist;Supervision/safety;Rolling walker (2 wheels)       Vision Baseline Vision/History: 1 Wears glasses (bifocles) Ability to See in Adequate Light: 0 Adequate Patient Visual Report: No change from baseline Vision Assessment?: No apparent visual deficits Additional Comments: wears bifocles, denies blurry vision, sensitivity to light, etc     Perception         Praxis         Pertinent Vitals/Pain Pain Assessment Pain Assessment: Faces Faces Pain Scale: Hurts a little bit Pain Location: L hip/backside Pain Descriptors / Indicators: Guarding, Sore Pain Intervention(s): Monitored during session, Limited activity within patient's tolerance     Extremity/Trunk Assessment Upper Extremity  Assessment Upper Extremity Assessment: Overall WFL for tasks assessed;Right hand dominant   Lower Extremity Assessment Lower Extremity Assessment: Defer to PT evaluation   Cervical / Trunk Assessment Cervical / Trunk Assessment: Normal   Communication  Communication Communication: No apparent difficulties   Cognition Arousal: Alert Behavior During Therapy: WFL for tasks assessed/performed, Impulsive Overall Cognitive Status: Impaired/Different from baseline Area of Impairment: Attention, Awareness                   Current Attention Level: Selective       Awareness: Emergent   General Comments: pleasant, talkative, showing insight into deficits but does need cues to attend to tasks and use DME properly. unable to formally assess today     General Comments  Wife present    Exercises     Shoulder Instructions      Home Living Family/patient expects to be discharged to:: Private residence Living Arrangements: Spouse/significant other Available Help at Discharge: Family;Available 24 hours/day Type of Home: House Home Access: Stairs to enter [employer] of Steps: 3-4 Entrance Stairs-Rails: Right Home Layout: One level     Bathroom Shower/Tub: Chief Strategy Officer: Standard     Home Equipment: Firefighter          Prior Functioning/Environment Prior Level of Function : Independent/Modified Independent;Working/employed;Driving             Mobility Comments: no AD for mobility. active at baseline, rides exercise bike, walks 2-3 miles and does weight training ADLs Comments: works part time at [employer] Problem List: Impaired balance (sitting and/or standing);Decreased knowledge of use of DME or AE;Pain;Decreased cognition      OT Treatment/Interventions: Self-care/ADL training;Therapeutic exercise;Energy conservation;DME and/or AE instruction;Therapeutic activities;Patient/family education;Balance training    OT Goals(Current goals can be found in the care plan section) Acute Rehab OT Goals Patient Stated Goal: home today OT Goal Formulation: With patient/family Time For Goal Achievement: 08/28/23 Potential to Achieve Goals: Good ADL Goals Additional ADL  Goal #1: Pt to complete 3 step trail making task without verbal cues Additional ADL Goal #2: Pt to complete pill box test with 0 errors Additional ADL Goal #3: Pt to verbalize at least 3 fall prevention strategies  OT Frequency: Min 1X/week    Co-evaluation              AM-PAC OT "6 Clicks" Daily Activity     Outcome Measure Help from another person eating meals?: None Help from another person taking care of personal grooming?: A Little Help from another person toileting, which includes using toliet, bedpan, or urinal?: A Little Help from another person bathing (including washing, rinsing, drying)?: A Little Help from another person to put on and taking off regular upper body clothing?: A Little Help from another person to put on and taking off regular lower body clothing?: A Little 6 Click Score: 19   End of Session Equipment Utilized During Treatment: Gait belt;Rolling walker (2 wheels) Nurse Communication: Mobility status  Activity Tolerance: Patient tolerated treatment well Patient left: in chair;with call bell/phone within reach;with family/visitor present;Other (comment) (PT entering)  OT Visit Diagnosis: Unsteadiness on feet (R26.81);Other abnormalities of gait and mobility (R26.89);Other symptoms and signs involving cognitive function;Pain Pain - Right/Left: Left Pain - part of body: Hip                Time: 1610-9604 OT Time Calculation (min): 33 min Charges:  OT General Charges $OT  Visit: 1 Visit OT Evaluation $OT Eval Low Complexity: 1 Low OT Treatments $Self Care/Home Management : 8-22 mins  Bradd Canary, OTR/L Acute Rehab Services Office: [phone]   Lorre Munroe 08/28/2023, 11:05 AM

## 2023-08-29 DIAGNOSIS — S066X1A Traumatic subarachnoid hemorrhage with loss of consciousness of 30 minutes or less, initial encounter: Secondary | ICD-10-CM | POA: Diagnosis not present

## 2023-08-29 MED ORDER — MOMETASONE FURO-FORMOTEROL FUM 200-5 MCG/ACT IN AERO
2.0000 | INHALATION_SPRAY | Freq: Two times a day (BID) | RESPIRATORY_TRACT | Status: DC
  Administered 2023-08-29: 2 via RESPIRATORY_TRACT
  Filled 2023-08-29: qty 8.8

## 2023-08-29 MED ORDER — CEPHALEXIN 500 MG PO CAPS: 500.0000 mg | ORAL_CAPSULE | Freq: Two times a day (BID) | ORAL | 0 refills | Status: AC

## 2023-08-29 MED ORDER — ALBUTEROL SULFATE (2.5 MG/3ML) 0.083% IN NEBU: 2.5000 mg | INHALATION_SOLUTION | Freq: Four times a day (QID) | RESPIRATORY_TRACT | Status: DC | PRN

## 2023-08-29 MED ORDER — OXYCODONE HCL 5 MG PO TABS: 5.0000 mg | ORAL_TABLET | Freq: Four times a day (QID) | ORAL | 0 refills | Status: AC | PRN

## 2023-08-29 MED ORDER — ALBUTEROL SULFATE HFA 108 (90 BASE) MCG/ACT IN AERS: 2.0000 | INHALATION_SPRAY | Freq: Four times a day (QID) | RESPIRATORY_TRACT | Status: DC

## 2023-08-29 NOTE — Discharge Summary (Signed)
Physician Discharge Summary  Gregory [name] ZOX:[id number] DOB: [birth year] DOA: 08/25/2023  PCP: Ronnald Nian, MD  Admit date: 08/25/2023  Discharge date: 08/29/2023  Admitted From: Home.  Disposition:  Home.  Recommendations for Outpatient Follow-up:  Follow up with PCP in 1-2 weeks. Please obtain BMP/CBC in one week. Advised to follow-up with Neurosurgery in 2 weeks. Advised to hold aspirin for 2 weeks. Advised to return back to work in 1 week. Advised to take Keflex for 3 more doses for temporal fracture.  Home Health:None Equipment/Devices:None  Discharge Condition: Stable CODE STATUS:Full code Diet recommendation: Heart Healthy   Brief [facility] Course: This 72 year old male with PMH significant for hypertension, coronary disease status post one-vessel CABG, status post aortic valve replacement with bioprosthetic valve, history of peripheral vascular disease,  status post aortobifem bypass, hyperlipidemia, bipolar disorder, hyperlipidemia presented in the ED after a fall with noted prodrome of dizziness prior to fall. There is recent history remarkable for bradycardia -previously had worn a Zio patch for 2 weeks that appears to be unremarkable per his report.  CT head showed scattered posttraumatic subarachnoid hemorrhages. Neurosurgery was consulted recommended no acute or urgent intervention needed.  Advised observation and outpatient follow-up.  Repeat imaging showed slightly increased small to moderate volume of acute subarachnoid hemorrhage with 1.8 cm inferior left frontal lobe hemorrhagic contusion without substantial mass effect.  Nondisplaced fracture of the right temporoparietal calvarium noted.  Patient was admitted for further evaluation.  Patient was continued on supportive care.  Repeat CT head remains stable.  PT and OT recommended outpatient PT.  Patient reports headache is improved.  He feels better and wants to be discharged.  Patient is being discharged  home.  Discharge Diagnoses:  Principal Problem:   Subarachnoid hemorrhage following injury (HCC) Active Problems:   Skull fracture with cerebral contusion (HCC)   Bipolar disorder (HCC)   Hyperlipidemia with target LDL less than 130   Hypertension   S/P CABG x 1   S/P aortic valve replacement with bioprosthetic valve   S/P aortobifemoral bypass surgery   Temporal bone fracture (HCC)  Subarachnoid hemorrhage following injury [facility]): CT head showed scattered posttraumatic subarachnoid hemorrhages.  Neurosurgery consulted.  Patient transferred to [facility].   Repeat CT head showed slightly increased small to moderate volume of acute SAH with 1.8 cm inferior left frontal lobe hemorrhagic contusion without substantial mass effect.  Neurosurgery recommended no acute neurosurgical intervention needed.  Continue with observation. Hold ASA. SCDs for DVT prophylaxis.   Skull fracture with cerebral contusion (HCC) Seen in CTA head. Has hemorrhagic contusion at the anterior left frontal lobe measures 1.9 cm.  Pt already on depakote and lamictal for bipolar disorder. Continue both.   S/P aortobifemoral bypass surgery Stable. Holding ASA for 2 weeks..   S/P aortic valve replacement with bioprosthetic valve. Stable.   S/P CABG x 1 Stable. Holding ASA.   Hypertension: Continue with norvasc,  ACEI /hydrochlorothiazide.   Hyperlipidemia with target LDL less than 130 Continue lipitor 80 mg daily.   Bipolar disorder (HCC) Continue with depakote and lamictal.   Temporal bone fracture (HCC) Extension into right mastoid air cells. Complete  prophylactic keflex. Pt with hx of recurrent sinusitis.  Discharge Instructions  Discharge Instructions     Ambulatory referral to Physical Therapy   Complete by: As directed    Call MD for:  persistant dizziness or light-headedness   Complete by: As directed    Call MD for:  persistant nausea and vomiting  Complete by: As directed    Call MD  for:  redness, tenderness, or signs of infection (pain, swelling, redness, odor or green/yellow discharge around incision site)   Complete by: As directed    Diet - low sodium heart healthy   Complete by: As directed    Diet Carb Modified   Complete by: As directed    Discharge instructions   Complete by: As directed    Advised to follow-up with primary care physician in 1 week. Advised to follow-up with neurosurgery in 2 weeks. Advised to hold aspirin for 2 weeks. Advised to return back to work in 1 week.   Increase activity slowly   Complete by: As directed       Allergies as of 08/29/2023       Reactions   Codeine Anaphylaxis   Dihydrocodeine Anaphylaxis        Medication List     STOP taking these medications    amoxicillin-clavulanate 875-125 MG tablet Commonly known as: AUGMENTIN   aspirin EC 81 MG tablet       TAKE these medications    acetaminophen 650 MG CR tablet Commonly known as: TYLENOL Take 650 mg by mouth every 8 (eight) hours as needed for pain.   albuterol 108 (90 Base) MCG/ACT inhaler Commonly known as: VENTOLIN HFA INHALE TWO PUFFS BY MOUTH EVERY 6 HOURS AS NEEDED FOR WHEEZING   amLODipine 10 MG tablet Commonly known as: NORVASC Take 1 tablet (10 mg total) by mouth daily.   amoxicillin 500 MG capsule Commonly known as: AMOXIL Take 4 capsules (2,000 mg total) by mouth once as needed for up to 1 dose. Take 4 tablets one hour before dental work   atorvastatin 80 MG tablet Commonly known as: LIPITOR Take 1 tablet (80 mg total) by mouth daily.   azelastine 0.1 % nasal spray Commonly known as: ASTELIN Place 1-2 sprays into both nostrils 2 (two) times daily as needed (nasal drainage). Use in each nostril as directed   cephALEXin 500 MG capsule Commonly known as: KEFLEX Take 1 capsule (500 mg total) by mouth every 12 (twelve) hours for 3 doses.   divalproex 500 MG 24 hr tablet Commonly known as: DEPAKOTE ER Take 1 tablet (500 mg total)  by mouth 3 (three) times daily. What changed:  how much to take when to take this   finasteride 5 MG tablet Commonly known as: PROSCAR Take 1 tablet (5 mg total) by mouth daily.   fluticasone-salmeterol 500-50 MCG/ACT Aepb Commonly known as: Advair Diskus Inhale 1 puff into the lungs in the morning and at bedtime.   lamoTRIgine 200 MG tablet Commonly known as: LAMICTAL Take 1 tablet (200 mg total) by mouth every morning.   lisinopril-hydrochlorothiazide 20-12.5 MG tablet Commonly known as: ZESTORETIC Take 1 tablet by mouth every morning.   MULTIVITAMIN ADULTS 50+ PO Take 1 tablet by mouth daily.   nicotine polacrilex 4 MG lozenge Commonly known as: COMMIT Take 4 mg by mouth as needed for smoking cessation.   oxyCODONE 5 MG immediate release tablet Commonly known as: Oxy IR/ROXICODONE Take 1 tablet (5 mg total) by mouth every 6 (six) hours as needed for up to 3 days for breakthrough pain.               Durable Medical Equipment  (From admission, onward)           Start     Ordered   08/28/23 1119  For home use only DME Walker rolling  Once       Question Answer Comment  Walker: With 5 Inch Wheels   Patient needs a walker to treat with the following condition Weakness      08/28/23 1118            Follow-up Information     [facility] Schedule an appointment as soon as possible for a visit.   Specialty: Rehabilitation Contact information: [address] [place] [address] [phone]        Ronnald Nian, MD Follow up in 1 week(s).   Specialty: Family Medicine Contact information: [address] [place] [address] [phone]         Dawley, Alan Mulder, DO Follow up in 2 week(s).   Contact information: [address] [place] [address] [phone]                Allergies  Allergen Reactions   Codeine Anaphylaxis   Dihydrocodeine Anaphylaxis     Consultations: Neurosurgery   Procedures/Studies: DG HIP UNILAT WITH PELVIS 2-3 VIEWS LEFT  Result Date: 08/28/2023 CLINICAL DATA:  Left hip pain. EXAM: DG HIP (WITH OR WITHOUT PELVIS) 2-3V LEFT COMPARISON:  KUB 10/03/2020 FINDINGS: There is diffuse decreased bone mineralization. Mild bilateral sacroiliac subchondral sclerosis. Minimal superior pubic symphysis degenerative spurring. The bilateral femoroacetabular joint spaces are maintained. No acute fracture or dislocation. Surgical clips overlie the inferior left hip and right hemipelvis. L4-5 bilateral transpedicular rod and screw fusion. Mild high-grade atherosclerotic calcifications. IMPRESSION: Mild bilateral sacroiliac and superior pubic symphysis degenerative spurring. No significant left hip osteoarthritis. Electronically Signed   By: Neita Garnet M.D.   On: 08/28/2023 15:08   CT HEAD WO CONTRAST ( )  Result Date: 08/27/2023 CLINICAL DATA:  Subarachnoid hemorrhage [facility]) EXAM: CT HEAD WITHOUT CONTRAST TECHNIQUE: Contiguous axial images were obtained from the base of the skull through the vertex without intravenous contrast. RADIATION DOSE REDUCTION: This exam was performed according to the departmental dose-optimization program which includes automated exposure control, adjustment of the mA and/or kV according to patient size and/or use of iterative reconstruction technique. COMPARISON:  CT Head 08/26/23 FINDINGS: Brain: No significant interval change in bilateral frontal convexity subdural fluid collection measuring up to 6 mm bilaterally. Redemonstrated subarachnoid hemorrhage and hemorrhagic contusions along the bilateral anterior frontal lobes, slightly increased on the left measuring up 2.1 cm ( previously 1.9 cm), and unchanged along the right frontal lobe. Compared to prior exam there is interval increase in conspicuity of subarachnoid blood products along the bilateral parietal lobes, likely due to redistribution. Unchanged  subarachnoid blood products in the right middle cranial fossa. No evidence of intraventricular extension. No hydrocephalus. No CT evidence of an acute cortical infarct. Vascular: No hyperdense vessel or unexpected calcification. Skull: Redemonstrated acute nondisplaced fracture involving the right temporal bone (series 3, image 21). Sinuses/Orbits: Small right mastoid effusion. No middle ear effusion. Mild mucosal thickening in the right sphenoid sinus. Bilateral lens replacement. Orbits are otherwise unremarkable. Other: None. IMPRESSION: 1. No significant interval change in bilateral frontal convexity subdural fluid collection measuring up to 6 mm. 2. Redemonstrated subarachnoid hemorrhage and hemorrhagic contusions along the bilateral anterior frontal lobes, slightly increased on the left measuring up 2.1 cm ( previously 1.9 cm), and unchanged along the right frontal lobe. Compared to prior exam there is interval increase in conspicuity of subarachnoid blood products along the bilateral parietal lobes, likely due to redistribution. No evidence of intraventricular extension. No hydrocephalus. 3. Redemonstrated  acute nondisplaced fracture involving the right temporal bone. Electronically Signed   By: Lorenza Cambridge M.D.   On: 08/27/2023 17:10   CT HEAD WO CONTRAST ( )  Result Date: 08/26/2023 CLINICAL DATA:  Subarachnoid hemorrhage [facility]) EXAM: CT HEAD WITHOUT CONTRAST TECHNIQUE: Contiguous axial images were obtained from the base of the skull through the vertex without intravenous contrast. RADIATION DOSE REDUCTION: This exam was performed according to the departmental dose-optimization program which includes automated exposure control, adjustment of the mA and/or kV according to patient size and/or use of iterative reconstruction technique. COMPARISON:  CT head dated 08/25/23 FINDINGS: Brain: Slightly increased small to moderate volume of acute subarachnoid hemorrhage along bilateral cerebral convexities.  Approximately 1.8 cm inferior left frontal lobe intraparenchymal hemorrhagic contusion without substantial mass effect. Probable multiple small (subcentimeter) right anterior temporal lobe hemorrhagic contusions. No midline shift. No hydrocephalus. No evidence of acute large vascular territory infarct. Vascular: No hyperdense vessel identified. Skull: Acute nondisplaced fracture involving the right temporoparietal calvarium with extension to involve the right mastoid air cells. Sinuses/Orbits: Clear sinuses.  No acute orbital findings. Other: Small right mastoid effusion without definite temporal bone fracture. IMPRESSION: 1. Slightly increased small to moderate volume of acute subarachnoid hemorrhage along bilateral cerebral convexities. 2. Approximately 1.8 cm inferior left frontal lobe intraparenchymal hemorrhagic contusion without substantial mass effect. Multiple small acute subcentimeter parenchymal contusion is in the anterior right temporal lobe. Findings are progressed since yesterday's head CT. 3. Acute nondisplaced fracture involving the right temporoparietal calvarium with extension to involve the right mastoid air cells. Electronically Signed   By: Feliberto Harts M.D.   On: 08/26/2023 10:29   CT ANGIO HEAD NECK W WO CM  Result Date: 08/25/2023 CLINICAL DATA:  Initial evaluation for hemorrhagic stroke. EXAM: CT ANGIOGRAPHY HEAD AND NECK WITH AND WITHOUT CONTRAST TECHNIQUE: Multidetector CT imaging of the head and neck was performed using the standard protocol during bolus administration of intravenous contrast. Multiplanar CT image reconstructions and MIPs were obtained to evaluate the vascular anatomy. Carotid stenosis measurements (when applicable) are obtained utilizing NASCET criteria, using the distal internal carotid diameter as the denominator. RADIATION DOSE REDUCTION: This exam was performed according to the departmental dose-optimization program which includes automated exposure control,  adjustment of the mA and/or kV according to patient size and/or use of iterative reconstruction technique. CONTRAST:  75mL OMNIPAQUE IOHEXOL 350 MG/ML SOLN COMPARISON:  CT from earlier the same day. FINDINGS: CT HEAD FINDINGS Brain: Scattered posttraumatic subarachnoid hemorrhage involving the anterior/inferior frontal convexities, left greater than right. Superimposed evolving hemorrhagic contusion at the anterior left frontal lobe measures 1.9 cm. No significant regional mass effect at this time. No other acute intracranial hemorrhage or large vessel territory infarct. No mass lesion or midline shift. No hydrocephalus. No other extra-axial fluid collection. Vascular: No abnormal hyperdense vessel. Scattered vascular calcifications noted within the carotid siphons. Skull: There is an acute fracture involving the right temporoparietal calvarium, best seen on corresponding CTA (series 15, image 152). Extension to partially involve the right mastoid air cells (series 15, image 141). No significant displacement. Sinuses/Orbits: Globes and orbital soft tissues within normal limits. Small volume layering secretions noted within the sphenoid sinuses. Paranasal sinuses are otherwise clear. Small right mastoid effusion related to the acute temporal bone fracture. Left mastoid air cells are clear. Other: None. Review of the MIP images confirms the above findings CTA NECK FINDINGS Aortic arch: Aortic arch within normal limits for caliber standard 3 vessel morphology. Moderate aortic atherosclerosis. No high-grade stenosis about  the origin of the great vessels. Right carotid system: Right common and internal carotid arteries are patent without dissection. Moderate atheromatous change about the right carotid artery system without hemodynamically significant greater than 50% stenosis. Left carotid system: Left common and internal carotid arteries are patent without dissection. Moderate atheromatous change about the left carotid  artery system without hemodynamically significant greater than 50% stenosis. Vertebral arteries: Both vertebral arteries arise from subclavian arteries. No significant proximal subclavian artery stenosis. Left vertebral artery dominant with a diffusely hypoplastic right vertebral artery. Atheromatous plaque at the origin of the right vertebral artery with moderate to severe stenosis. Contralateral left vertebral artery is heavily diseased with extensive atheromatous change. Resultant moderate to severe multifocal stenoses about the left V1 and V2 segments as well as the left V3 segment. Skeleton: Severe spondylosis noted throughout the cervicothoracic spine. No worrisome osseous lesions. Prior sternotomy. Other neck: No other acute finding. Upper chest: Emphysema.  No other acute finding. Review of the MIP images confirms the above findings CTA HEAD FINDINGS Anterior circulation: Scattered atheromatous change about the carotid siphons with associated mild multifocal narrowing. A1 segments patent bilaterally. Normal anterior communicating complex. Anterior cerebral arteries widely patent. No M1 stenosis or occlusion. Distal MCA branches perfused and symmetric. Posterior circulation: Hypoplastic right vertebral artery terminates in PICA. Right PICA patent. Scattered plaque within the dominant left V4 segment with associated mild stenoses. Left PICA patent. Basilar patent without stenosis. Superior cerebellar and posterior cerebral arteries patent bilaterally. Venous sinuses: Patent allowing for timing the contrast bolus. Anatomic variants: As above.  No aneurysm. Review of the MIP images confirms the above findings IMPRESSION: CT HEAD IMPRESSION: 1. Scattered posttraumatic subarachnoid hemorrhage involving the anterior/inferior frontal convexities, left greater than right. Superimposed 1.9 cm evolving hemorrhagic contusion at the anterior left frontal lobe. No significant regional mass effect at this time. 2. Acute  fracture involving the right temporoparietal calvarium, with extension to partially involve the right mastoid air cells. No significant displacement. CTA HEAD AND NECK: 1. Negative CTA for large vessel occlusion or other emergent finding. 2. Moderate atheromatous change about the carotid siphons with associated mild multifocal narrowing. 3. Heavy atherosclerotic disease throughout the dominant left vertebral artery with associated moderate to severe multifocal stenoses within the neck. Moderate to severe stenosis at the origin of the hypoplastic right vertebral artery. 4. Moderate atheromatous change about the carotid artery systems within the neck without hemodynamically significant greater than 50% stenosis. Aortic Atherosclerosis (ICD10-I70.0) and Emphysema (ICD10-J43.9). Electronically Signed   By: Rise Mu M.D.   On: 08/25/2023 23:14   CT Head Wo Contrast  Result Date: 08/25/2023 CLINICAL DATA:  Polytrauma, blunt EXAM: CT HEAD WITHOUT CONTRAST CT CERVICAL SPINE WITHOUT CONTRAST TECHNIQUE: Multidetector CT imaging of the head and cervical spine was performed following the standard protocol without intravenous contrast. Multiplanar CT image reconstructions of the cervical spine were also generated. RADIATION DOSE REDUCTION: This exam was performed according to the departmental dose-optimization program which includes automated exposure control, adjustment of the mA and/or kV according to patient size and/or use of iterative reconstruction technique. COMPARISON:  None Available. FINDINGS: CT HEAD FINDINGS Brain: Small to moderate volume of acute subarachnoid hemorrhage along the right greater than left cerebral convexities. No significant mass effect. No evidence of acute large vascular territory infarct, a mass lesion, midline shift or hydrocephalus. Vascular: Calcific atherosclerosis. Skull: No acute fracture. Sinuses/Orbits: Mostly clear sinuses.  No acute orbital findings. Other: No mastoid  effusions. CT CERVICAL SPINE FINDINGS Alignment: Reversal the  normal cervical lordosis. Mild anterolisthesis of C2 on C3. Skull base and vertebrae: No evidence of acute fracture. Soft tissues and spinal canal: No prevertebral fluid or swelling. No visible canal hematoma. Disc levels: Severe multilevel degenerative change. Upper chest: Visualized lung apices are clear. IMPRESSION: 1. Small to moderate volume of acute subarachnoid hemorrhage along the right greater than left cerebral convexities. No significant mass effect. 2. No evidence of acute fracture or traumatic malalignment in the cervical spine. Severe multilevel degenerative change. Small to moderate findings discussed with Dr. Jeraldine Loots Via telephone at 5:06 p.m. Electronically Signed   By: Feliberto Harts M.D.   On: 08/25/2023 17:11   CT Cervical Spine Wo Contrast  Result Date: 08/25/2023 CLINICAL DATA:  Polytrauma, blunt EXAM: CT HEAD WITHOUT CONTRAST CT CERVICAL SPINE WITHOUT CONTRAST TECHNIQUE: Multidetector CT imaging of the head and cervical spine was performed following the standard protocol without intravenous contrast. Multiplanar CT image reconstructions of the cervical spine were also generated. RADIATION DOSE REDUCTION: This exam was performed according to the departmental dose-optimization program which includes automated exposure control, adjustment of the mA and/or kV according to patient size and/or use of iterative reconstruction technique. COMPARISON:  None Available. FINDINGS: CT HEAD FINDINGS Brain: Small to moderate volume of acute subarachnoid hemorrhage along the right greater than left cerebral convexities. No significant mass effect. No evidence of acute large vascular territory infarct, a mass lesion, midline shift or hydrocephalus. Vascular: Calcific atherosclerosis. Skull: No acute fracture. Sinuses/Orbits: Mostly clear sinuses.  No acute orbital findings. Other: No mastoid effusions. CT CERVICAL SPINE FINDINGS Alignment:  Reversal the normal cervical lordosis. Mild anterolisthesis of C2 on C3. Skull base and vertebrae: No evidence of acute fracture. Soft tissues and spinal canal: No prevertebral fluid or swelling. No visible canal hematoma. Disc levels: Severe multilevel degenerative change. Upper chest: Visualized lung apices are clear. IMPRESSION: 1. Small to moderate volume of acute subarachnoid hemorrhage along the right greater than left cerebral convexities. No significant mass effect. 2. No evidence of acute fracture or traumatic malalignment in the cervical spine. Severe multilevel degenerative change. Small to moderate findings discussed with Dr. Jeraldine Loots Via telephone at 5:06 p.m. Electronically Signed   By: Feliberto Harts M.D.   On: 08/25/2023 17:11    Subjective: Patient was seen and examined at bedside. Overnight events noted.   Patient reports doing much better and wants to be discharged. He denies any headache, has participated in physical therapy.  Discharge Exam: Vitals:   08/29/23 0806 08/29/23 1145  BP: (!) 150/55 127/76  Pulse: (!) 55 (!) 52  Resp: 16 16  Temp: 98.2 F (36.8 C) 98.3 F (36.8 C)  SpO2: 93% 96%   Vitals:   08/29/23 0328 08/29/23 0500 08/29/23 0806 08/29/23 1145  BP: (!) 142/61  (!) 150/55 127/76  Pulse: (!) 51  (!) 55 (!) 52  Resp: 15  16 16   Temp: 97.7 F (36.5 C)  98.2 F (36.8 C) 98.3 F (36.8 C)  TempSrc: Oral  Oral Oral  SpO2: 96%  93% 96%  Weight:  63 kg    Height:        General: Pt is alert, awake, not in acute distress Cardiovascular: RRR, S1/S2 +, no rubs, no gallops Respiratory: CTA bilaterally, no wheezing, no rhonchi Abdominal: Soft, NT, ND, bowel sounds + Extremities: no edema, no cyanosis    The results of significant diagnostics from this hospitalization (including imaging, microbiology, ancillary and laboratory) are listed below for reference.     Microbiology:  No results found for this or any previous visit (from the past 240  hour(s)).   Labs: BNP (last 3 results) No results for input(s): "BNP" in the last 8760 hours. Basic Metabolic Panel: Recent Labs  Lab 08/25/23 1606  NA 137  K 4.3  CL 104  CO2 23  GLUCOSE 98  BUN 23  CREATININE 1.18  CALCIUM 9.0   Liver Function Tests: No results for input(s): "AST", "ALT", "ALKPHOS", "BILITOT", "PROT", "ALBUMIN" in the last 168 hours. No results for input(s): "LIPASE", "AMYLASE" in the last 168 hours. No results for input(s): "AMMONIA" in the last 168 hours. CBC: Recent Labs  Lab 08/25/23 1606  WBC 8.1  HGB 13.3  HCT 40.8  MCV 92.5  PLT 117*   Cardiac Enzymes: No results for input(s): "CKTOTAL", "CKMB", "CKMBINDEX", "TROPONINI" in the last 168 hours. BNP: Invalid input(s): "POCBNP" CBG: Recent Labs  Lab 08/25/23 1606  GLUCAP 101*   D-Dimer No results for input(s): "DDIMER" in the last 72 hours. Hgb A1c No results for input(s): "HGBA1C" in the last 72 hours. Lipid Profile No results for input(s): "CHOL", "HDL", "LDLCALC", "TRIG", "CHOLHDL", "LDLDIRECT" in the last 72 hours. Thyroid function studies No results for input(s): "TSH", "T4TOTAL", "T3FREE", "THYROIDAB" in the last 72 hours.  Invalid input(s): "FREET3" Anemia work up No results for input(s): "VITAMINB12", "FOLATE", "FERRITIN", "TIBC", "IRON", "RETICCTPCT" in the last 72 hours. Urinalysis    Component Value Date/Time   COLORURINE YELLOW 08/25/2023 1606   APPEARANCEUR CLEAR 08/25/2023 1606   LABSPEC 1.016 08/25/2023 1606   PHURINE 6.0 08/25/2023 1606   GLUCOSEU NEGATIVE 08/25/2023 1606   HGBUR NEGATIVE 08/25/2023 1606   BILIRUBINUR NEGATIVE 08/25/2023 1606   BILIRUBINUR neg 05/26/2014 1551   KETONESUR 5 (A) 08/25/2023 1606   PROTEINUR NEGATIVE 08/25/2023 1606   UROBILINOGEN negative 05/26/2014 1551   UROBILINOGEN 1.0 01/20/2014 1608   NITRITE NEGATIVE 08/25/2023 1606   LEUKOCYTESUR NEGATIVE 08/25/2023 1606   Sepsis Labs Recent Labs  Lab 08/25/23 1606  WBC 8.1    Microbiology No results found for this or any previous visit (from the past 240 hour(s)).   Time coordinating discharge: Over 30 minutes  SIGNED:   Willeen Niece, MD  [facility] 08/29/2023, 2:02 PM Pager   If 7PM-7AM, please contact night-coverage

## 2023-08-29 NOTE — Plan of Care (Signed)
Pending discharge to home. 

## 2023-08-29 NOTE — Plan of Care (Signed)

## 2023-08-29 NOTE — Discharge Instructions (Signed)
Advised to follow-up with neurosurgery in 2 weeks. Advised to hold aspirin for 2 weeks. Advised to return back to work in 1 week.

## 2023-09-01 ENCOUNTER — Ambulatory Visit: Payer: PPO | Attending: Family Medicine | Admitting: Physical Therapy

## 2023-09-01 DIAGNOSIS — R2681 Unsteadiness on feet: Secondary | ICD-10-CM | POA: Diagnosis not present

## 2023-09-01 DIAGNOSIS — Z9181 History of falling: Secondary | ICD-10-CM | POA: Insufficient documentation

## 2023-09-01 DIAGNOSIS — R2689 Other abnormalities of gait and mobility: Secondary | ICD-10-CM | POA: Insufficient documentation

## 2023-09-01 DIAGNOSIS — M6281 Muscle weakness (generalized): Secondary | ICD-10-CM | POA: Insufficient documentation

## 2023-09-01 DIAGNOSIS — R42 Dizziness and giddiness: Secondary | ICD-10-CM | POA: Insufficient documentation

## 2023-09-01 DIAGNOSIS — R55 Syncope and collapse: Secondary | ICD-10-CM | POA: Insufficient documentation

## 2023-09-01 DIAGNOSIS — I609 Nontraumatic subarachnoid hemorrhage, unspecified: Secondary | ICD-10-CM | POA: Insufficient documentation

## 2023-09-01 NOTE — Therapy (Signed)
OUTPATIENT PHYSICAL THERAPY NEURO EVALUATION   Patient Name: Gregory [name] MRN: [id number] DOB:1951-03-01, 72 y.o., male Today's Date: 09/01/2023   PCP: Ronnald Nian, MD REFERRING PROVIDER: Willeen Niece, MD  END OF SESSION:  PT End of Session - 09/01/23 0937     Visit Number 1    Number of Visits 17    Date for PT Re-Evaluation 11/10/23    Authorization Type [employer]    PT Start Time 0930    PT Stop Time 1023    PT Time Calculation (min) 53 min    Activity Tolerance Patient tolerated treatment well;Patient limited by pain    Behavior During Therapy [facility] for tasks assessed/performed             Past Medical History:  Diagnosis Date   Alcohol abuse    Aortic stenosis    Arthritis    Asthma    exercise indused or pollen-uses inhaler dail yand has rescue inhaler if needed   Bipolar disorder (HCC)    CAD (coronary artery disease)    coronary calcifications 08/2013 CTA   Carotid artery occlusion    Cataract    bilateral sx   Chronic back pain    Claudication (HCC)    Complication of anesthesia 10/16/2020   "had a panic attack when the mask was put on me"" I do not think I was given anything  before, I need something to calm me down"   Depression    Family history of skin cancer    Heart murmur    as an infant   Hepatitis    h/o 1970,doesn't remember which type,1990 epstain-barr   Herpes zoster without complication 09/05/2019   Hyperlipidemia    Hypertension    Neuromuscular disorder (HCC)    Obsessive compulsive disorder    PAD (peripheral artery disease) (HCC)    Peripheral neuropathy    Pneumonia    RBBB    RBBB (right bundle branch block with left anterior fascicular block)    Seasonal allergies    Severe aortic stenosis    Sleep apnea    does not wear CPAP   Smoker    Spinal stenosis at L4-L5 level    Tobacco abuse    Tobacco use disorder    Past Surgical History:  Procedure Laterality Date   ANAL FISTULECTOMY  09/25/2011    AORTA - BILATERAL FEMORAL ARTERY BYPASS GRAFT N/A 10/03/2020   Procedure: AORTA BIFEMORAL BYPASS GRAFT USING A HEMASHIELD GOLD BIFURCATED 14 X 7mm GRAFT AND INFERIOR MESENTERIC ARTERY REIMPLANTATION;  Surgeon: Nada Libman, MD;  Location: [facility] OR;  Service: Vascular;  Laterality: N/A;   AORTIC VALVE REPLACEMENT N/A 11/24/2019   Procedure: AORTIC VALVE REPLACEMENT (AVR) using INSPIRIS Resilia 23 MM Bioprosthetic Aortic Valve.;  Surgeon: Alleen Borne, MD;  Location: [facility] OR;  Service: Open Heart Surgery;  Laterality: N/A;   APPLICATION OF WOUND VAC Left 10/17/2020   Procedure: APPLICATION OF WOUND VAC;  Surgeon: Nada Libman, MD;  Location: [facility] OR;  Service: Vascular;  Laterality: Left;   BACK SURGERY     2015   CATARACT EXTRACTION, BILATERAL  2022   COLONOSCOPY  2009   MS-F/V-mov(exc)-HPP   CORONARY ARTERY BYPASS GRAFT N/A 11/24/2019   Procedure: CORONARY ARTERY BYPASS GRAFTING (CABG) using LIMA to LAD.;  Surgeon: Alleen Borne, MD;  Location: [facility] OR;  Service: Open Heart Surgery;  Laterality: N/A;   ELBOW SURGERY     bilaterally for cubital tunnel  ENDARTERECTOMY N/A 10/03/2020   Procedure: AORTIC ENDARTERECTOMY;  Surgeon: Nada Libman, MD;  Location: [facility] OR;  Service: Vascular;  Laterality: N/A;   ENDARTERECTOMY FEMORAL Bilateral 10/03/2020   Procedure: BILATERAL FEMORAL ENDARTERECTOMY;  Surgeon: Nada Libman, MD;  Location: [facility] OR;  Service: Vascular;  Laterality: Bilateral;   HERNIA REPAIR     with mesh   INCISION AND DRAINAGE Left 01/02/2021   Procedure: INCISION AND DRAINAGE LEFT GROIN WITH STIMULAN BEADS;  Surgeon: Nada Libman, MD;  Location: [facility] OR;  Service: Vascular;  Laterality: Left;   INCISION AND DRAINAGE OF WOUND Left 10/17/2020   Procedure: EXPLORATION LEFT GROIN WOUND;  Surgeon: Nada Libman, MD;  Location: [facility] OR;  Service: Vascular;  Laterality: Left;   MAXIMUM ACCESS (MAS)POSTERIOR LUMBAR INTERBODY FUSION (PLIF) 1 LEVEL N/A 10/25/2014   Procedure:  LUMBAR FOUR TO FIVE MAXIMUM ACCESS (MAS) POSTERIOR LUMBAR INTERBODY FUSION (PLIF) 1 LEVEL;  Surgeon: Tia Alert, MD;  Location: [facility] NEURO ORS;  Service: Neurosurgery;  Laterality: N/A;   RIGHT/LEFT HEART CATH AND CORONARY ANGIOGRAPHY N/A 11/10/2019   Procedure: RIGHT/LEFT HEART CATH AND CORONARY ANGIOGRAPHY;  Surgeon: Swaziland, [name], MD;  Location: [facility] INVASIVE CV LAB;  Service: Cardiovascular;  Laterality: N/A;   SKIN BIOPSY Left 10/12/2018   shave forehead Hypertrophic actinic kertosis with features of a verruca   TEE WITHOUT CARDIOVERSION N/A 11/24/2019   Procedure: TRANSESOPHAGEAL ECHOCARDIOGRAM (TEE);  Surgeon: Alleen Borne, MD;  Location: [facility] OR;  Service: Open Heart Surgery;  Laterality: N/A;   TONSILLECTOMY  age 48   VASECTOMY     X 2   VASECTOMY REVERSAL     Patient Active Problem List   Diagnosis Date Noted   Subarachnoid hemorrhage following injury (HCC) 08/26/2023   Skull fracture with cerebral contusion (HCC) 08/26/2023   Temporal bone fracture (HCC) 08/26/2023   Aortic atherosclerosis (HCC) 05/26/2023   Acute non-recurrent pansinusitis 01/04/2023   Perennial allergic rhinitis 07/09/2022   Asthma-COPD overlap syndrome 04/21/2022   Recurrent infections 04/21/2022   S/P aortobifemoral bypass surgery 04/03/2021   S/P CABG x 1 11/24/2019   S/P aortic valve replacement with bioprosthetic valve 11/24/2019   PAD (peripheral artery disease) (HCC)    Mild persistent asthma 10/29/2019   RBBB (right bundle branch block with left anterior fascicular block)    Peripheral neuropathy    S/P lumbar spinal fusion 10/25/2014   Recovering alcoholic in remission (HCC) 08/10/2014   Substance induced mood disorder (HCC) 06/30/2014   Substance-induced sleep disorder (HCC) 06/30/2014   Obsessive compulsive disorder    OSA (obstructive sleep apnea) 01/20/2014   CAD (coronary artery disease)    Mentally disabled 11/25/2012   Hyperlipidemia with target LDL less than 130 11/25/2012    Bipolar disorder (HCC)    Hypertension    Allergic rhinitis due to pollen 08/11/2011    ONSET DATE: 08/28/2023  REFERRING DIAG: R55 (ICD-10-CM) - Syncope, unspecified syncope type I60.9 (ICD-10-CM) - Subarachnoid hemorrhage (HCC)  THERAPY DIAG:  Unsteadiness on feet  Other abnormalities of gait and mobility  Muscle weakness (generalized)  History of falling  Dizziness and giddiness  Rationale for Evaluation and Treatment: Rehabilitation  SUBJECTIVE:  SUBJECTIVE STATEMENT: HOH on L side due to temporal bone fx  Pt reports long history of cardiac issues/dizziness, but has never lost consciousness. Pt reports he felt dizzy and was reaching up at an aisle in [employer]. Lost consciousness and fell backwards onto the concrete floor and his his head. Does not know how long he was unconscious, was found by another shopper. Has a history of bradycardia, wore a heart monitor for 2 weeks and was cleared by his cardiologist in August to exercise prior to this incident. Works as a Investment banker, operational at [employer] and is used to weightlifting/cardio several times per week. Wears a FitBit to track his HR. Per wife, is not using his RW properly at home and does not seem to know how to slow down. Pt and wife concerned over how quickly pt was DC from hospital as he is in significant pain and is very dizzy.      Pt accompanied by:  Wife, Chales Abrahams   PERTINENT HISTORY: CABG, aortic valve replacement (2021)  PAIN:  Are you having pain? Yes: NPRS scale: 8, 4-5/10 Pain location: Low back, head  Pain description: In low back, achy. In head, disorientation Aggravating factors: Movement  Relieving factors: Ice   PRECAUTIONS: Fall and Other: no bending forward (skull fx)   RED FLAGS: None   WEIGHT BEARING RESTRICTIONS:  No  FALLS: Has patient fallen in last 6 months? Yes. Number of falls 2 (hit head both times)   LIVING ENVIRONMENT: Lives with: lives with their spouse Lives in: House/apartment Stairs: Yes: External: 4 steps; on left going up Has following equipment at home: Walker - 2 wheeled and shower chair  PLOF: Independent  PATIENT GOALS: To be obtained next session   OBJECTIVE:   DIAGNOSTIC FINDINGS: CT of head from 08/27/23   IMPRESSION: 1. No significant interval change in bilateral frontal convexity subdural fluid collection measuring up to 6 mm. 2. Redemonstrated subarachnoid hemorrhage and hemorrhagic contusions along the bilateral anterior frontal lobes, slightly increased on the left measuring up 2.1 cm ( previously 1.9 cm), and unchanged along the right frontal lobe. Compared to prior exam there is interval increase in conspicuity of subarachnoid blood products along the bilateral parietal lobes, likely due to redistribution. No evidence of intraventricular extension. No hydrocephalus. 3. Redemonstrated acute nondisplaced fracture involving the right temporal bone.  COGNITION: Overall cognitive status: Impaired   SENSATION: WFL  POSTURE: rounded shoulders and forward head   LOWER EXTREMITY MMT:    MMT Right Eval Left Eval  Hip flexion    Hip extension    Hip abduction    Hip adduction    Hip internal rotation    Hip external rotation    Knee flexion    Knee extension    Ankle dorsiflexion    Ankle plantarflexion    Ankle inversion    Ankle eversion    (Blank rows = not tested)  BED MOBILITY:  Independent per pt  TRANSFERS: Assistive device utilized: Environmental consultant - 2 wheeled  Sit to stand: SBA Stand to sit: SBA   GAIT: Gait pattern: step through pattern, decreased stride length, antalgic, lateral hip instability, decreased trunk rotation, and trunk flexed Distance walked: Various clinic distances  Assistive device utilized: Walker - 2 wheeled Level of  assistance: CGA Comments: Pt very rigid and antalgic. No LOB noted but does require min cues to stay inside RW   FUNCTIONAL TESTS:  5x STS and MCTSIB to be assessed   PATIENT SURVEYS:  [place]  to be obtained  TODAY'S TREATMENT:            Next Session                                                                                                                       PATIENT EDUCATION: Education details: POC, eval findings, importance of vestibular/post-concussion rehab  Person educated: Patient and Spouse Education method: Explanation Education comprehension: verbalized understanding and needs further education  HOME EXERCISE PROGRAM: To be established   GOALS: Goals reviewed with patient? Yes  SHORT TERM GOALS: Target date: 09/29/2023   Pt will be independent with initial HEP for improved balance, transfers and gait.  Baseline: not established on eval  Goal status: INITIAL  2.  [place] to be assessed and STG/LTG updated  Baseline:  Goal status: INITIAL  3.  5x STS to be assessed and STG/LTG updated  Baseline:  Goal status: INITIAL  4.  MCTSIB to be assessed and STG/LTG updated  Baseline:  Goal status: INITIAL   LONG TERM GOALS: Target date: 10/27/2023   Pt will be independent with final HEP for improved balance, transfers and gait.  Baseline:  Goal status: INITIAL  2.  [place] goal  Baseline:  Goal status: INITIAL  3.  5x STS goal  Baseline:  Goal status: INITIAL  4.  MCTSIB goal  Baseline:  Goal status: INITIAL   ASSESSMENT:  CLINICAL IMPRESSION: Patient is a 72 year old male referred to Neuro OPPT for syncope and SAH.  Pt's PMH is significant for: CABG, aortic valve replacement, HTN, HLD, bipolar. The following deficits were present during the exam: dizziness, decreased activity tolerance, impaired balance, decreased safety awareness, impaired vision and post-concussion. Based on falls history, pt is an incr risk for falls. Balance and  vestibular function to be further assessed next session. Pt would benefit from skilled PT to address these impairments and functional limitations to maximize functional mobility independence.    OBJECTIVE IMPAIRMENTS: Abnormal gait, decreased activity tolerance, decreased balance, decreased cognition, decreased coordination, decreased endurance, decreased knowledge of condition, decreased knowledge of use of DME, decreased mobility, difficulty walking, decreased strength, decreased safety awareness, dizziness, impaired perceived functional ability, impaired vision/preception, and pain  ACTIVITY LIMITATIONS: carrying, lifting, bending, sleeping, stairs, transfers, hygiene/grooming, locomotion level, and caring for others  PARTICIPATION LIMITATIONS: meal prep, cleaning, laundry, interpersonal relationship, driving, shopping, community activity, occupation, and yard work  PERSONAL FACTORS: Fitness, Past/current experiences, and 3+ comorbidities: Bradycardia, post-concussion w/LOC,   are also affecting patient's functional outcome.   REHAB POTENTIAL: Good  CLINICAL DECISION MAKING: Unstable/unpredictable  EVALUATION COMPLEXITY: High  PLAN:  PT FREQUENCY: 2x/week  PT DURATION: 8 weeks  PLANNED INTERVENTIONS: Therapeutic exercises, Therapeutic activity, Neuromuscular re-education, Balance training, Gait training, Patient/Family education, Self Care, Joint mobilization, Stair training, Vestibular training, Canalith repositioning, DME instructions, Aquatic Therapy, Dry Needling, Electrical stimulation, Manual therapy, and Re-evaluation  PLAN FOR NEXT SESSION: Ask pt's goal for PT and update in note. Perform vestibular/vision screen. 5x STS, MCTSIB  and [place] and update goals. Establish HEP based on deficits.    [name] Klayton Monie, PT, DPT 09/01/2023, 12:33 PM

## 2023-09-02 ENCOUNTER — Ambulatory Visit (INDEPENDENT_AMBULATORY_CARE_PROVIDER_SITE_OTHER): Payer: PPO | Admitting: Family Medicine

## 2023-09-02 ENCOUNTER — Encounter: Payer: Self-pay | Admitting: Family Medicine

## 2023-09-02 VITALS — BP 132/72 | HR 41 | Wt 128.2 lb

## 2023-09-02 DIAGNOSIS — I609 Nontraumatic subarachnoid hemorrhage, unspecified: Secondary | ICD-10-CM

## 2023-09-02 DIAGNOSIS — S0633AA Contusion and laceration of cerebrum, unspecified, with loss of consciousness status unknown, initial encounter: Secondary | ICD-10-CM

## 2023-09-02 DIAGNOSIS — S0291XA Unspecified fracture of skull, initial encounter for closed fracture: Secondary | ICD-10-CM

## 2023-09-02 DIAGNOSIS — Z981 Arthrodesis status: Secondary | ICD-10-CM

## 2023-09-02 LAB — COMPREHENSIVE METABOLIC PANEL
ALT: 27 IU/L (ref 0–44)
AST: 26 IU/L (ref 0–40)
Albumin: 4.6 g/dL (ref 3.8–4.8)
Alkaline Phosphatase: 77 IU/L (ref 44–121)
BUN/Creatinine Ratio: 23 (ref 10–24)
BUN: 27 mg/dL (ref 8–27)
Bilirubin Total: 0.9 mg/dL (ref 0.0–1.2)
CO2: 23 mmol/L (ref 20–29)
Calcium: 9.3 mg/dL (ref 8.6–10.2)
Chloride: 95 mmol/L — ABNORMAL LOW (ref 96–106)
Creatinine, Ser: 1.15 mg/dL (ref 0.76–1.27)
Globulin, Total: 3.1 g/dL (ref 1.5–4.5)
Glucose: 110 mg/dL — ABNORMAL HIGH (ref 70–99)
Potassium: 4.6 mmol/L (ref 3.5–5.2)
Sodium: 135 mmol/L (ref 134–144)
Total Protein: 7.7 g/dL (ref 6.0–8.5)
eGFR: 67 mL/min/{1.73_m2} (ref >59)

## 2023-09-02 LAB — CBC WITH DIFFERENTIAL/PLATELET
Basophils Absolute: 0 10*3/uL (ref 0.0–0.2)
Basos: 1 %
EOS (ABSOLUTE): 0.1 10*3/uL (ref 0.0–0.4)
Eos: 2 %
Hematocrit: 46.3 % (ref 37.5–51.0)
Hemoglobin: 15.6 g/dL (ref 13.0–17.7)
Immature Grans (Abs): 0 10*3/uL (ref 0.0–0.1)
Immature Granulocytes: 0 %
Lymphocytes Absolute: 1.9 10*3/uL (ref 0.7–3.1)
Lymphs: 22 %
MCH: 29.9 pg (ref 26.6–33.0)
MCHC: 33.7 g/dL (ref 31.5–35.7)
MCV: 89 fL (ref 79–97)
Monocytes Absolute: 1.1 10*3/uL — ABNORMAL HIGH (ref 0.1–0.9)
Monocytes: 13 %
Neutrophils Absolute: 5.4 10*3/uL (ref 1.4–7.0)
Neutrophils: 62 %
Platelets: 174 10*3/uL (ref 150–450)
RBC: 5.21 x10E6/uL (ref 4.14–5.80)
RDW: 13.7 % (ref 11.6–15.4)
WBC: 8.6 10*3/uL (ref 3.4–10.8)

## 2023-09-02 MED ORDER — HYDROCODONE-ACETAMINOPHEN 10-325 MG PO TABS
1.0000 | ORAL_TABLET | Freq: Three times a day (TID) | ORAL | 0 refills | Status: AC | PRN
Start: 2023-09-02 — End: 2023-09-07

## 2023-09-02 NOTE — Patient Instructions (Signed)
Make the appointment with Dr. Yetta Barre and take the Tylenol with codeine and you can add Tylenol but no more than 3 g total per day If the headache gets worse you need to go to the hospital

## 2023-09-02 NOTE — Progress Notes (Signed)
   Subjective:    Patient ID: [name], male    DOB: 1951-02-22, 72 y.o.   MRN: [id number]  HPI He is here for hospital follow-up following skull fracture with subarachnoid bleed.  He was admitted on the third and sent home on the seventh.  He was seen by neurosurgery and will follow-up with them in the near future.  He left the hospital with a pain score of 4 out of 10 and states today it is mainly 7 out of 10 but he is having no blurred vision, double vision, weakness, nausea or vomiting.  He also states that his back has been increased in pain since the fall.  He has had previous surgery on his back.   Review of Systems     Objective:    Physical Exam Alert and in no distress.  Oriented x 3.  EOMI.  Moving all extremities without difficulty. The medical record including emergency room, hospital record and discharge summary was reviewed.      Assessment & Plan:   Problem List Items Addressed This Visit     Skull fracture with cerebral contusion (HCC)   Relevant Medications   HYDROcodone-acetaminophen (NORCO) 10-325 MG tablet   Other Visit Diagnoses     Subarachnoid hemorrhage (HCC)    -  Primary   Relevant Medications   HYDROcodone-acetaminophen (NORCO) 10-325 MG tablet   Other Relevant Orders   CBC with Differential/Platelet   Comprehensive metabolic panel     I will giving codeine he can also take extra Tylenol but not to exceed 3 g/day.  Cautioned that if his pain gets worse, he will probably need to go to the emergency room.  They will be working on getting him an appointment with neurosurgery to follow-up on this.

## 2023-09-08 ENCOUNTER — Ambulatory Visit (HOSPITAL_BASED_OUTPATIENT_CLINIC_OR_DEPARTMENT_OTHER): Payer: PPO

## 2023-09-08 ENCOUNTER — Other Ambulatory Visit (HOSPITAL_COMMUNITY): Payer: Self-pay | Admitting: Neurological Surgery

## 2023-09-08 ENCOUNTER — Encounter: Payer: Self-pay | Admitting: Physical Therapy

## 2023-09-08 ENCOUNTER — Ambulatory Visit: Payer: PPO | Admitting: Physical Therapy

## 2023-09-08 ENCOUNTER — Ambulatory Visit (HOSPITAL_BASED_OUTPATIENT_CLINIC_OR_DEPARTMENT_OTHER)
Admission: RE | Admit: 2023-09-08 | Discharge: 2023-09-08 | Disposition: A | Discharge status: Home / Self Care | Payer: PPO | Source: Ambulatory Visit | Attending: Neurological Surgery | Admitting: Neurological Surgery

## 2023-09-08 VITALS — BP 144/53 | HR 56

## 2023-09-08 DIAGNOSIS — M4316 Spondylolisthesis, lumbar region: Secondary | ICD-10-CM | POA: Insufficient documentation

## 2023-09-08 DIAGNOSIS — M48061 Spinal stenosis, lumbar region without neurogenic claudication: Secondary | ICD-10-CM | POA: Diagnosis not present

## 2023-09-08 DIAGNOSIS — Z9181 History of falling: Secondary | ICD-10-CM

## 2023-09-08 DIAGNOSIS — M6281 Muscle weakness (generalized): Secondary | ICD-10-CM

## 2023-09-08 DIAGNOSIS — M47816 Spondylosis without myelopathy or radiculopathy, lumbar region: Secondary | ICD-10-CM | POA: Diagnosis not present

## 2023-09-08 DIAGNOSIS — R2689 Other abnormalities of gait and mobility: Secondary | ICD-10-CM

## 2023-09-08 DIAGNOSIS — S069X1A Unspecified intracranial injury with loss of consciousness of 30 minutes or less, initial encounter: Secondary | ICD-10-CM

## 2023-09-08 DIAGNOSIS — R42 Dizziness and giddiness: Secondary | ICD-10-CM

## 2023-09-08 DIAGNOSIS — S020XXA Fracture of vault of skull, initial encounter for closed fracture: Secondary | ICD-10-CM | POA: Diagnosis not present

## 2023-09-08 DIAGNOSIS — R2681 Unsteadiness on feet: Secondary | ICD-10-CM

## 2023-09-08 DIAGNOSIS — I6523 Occlusion and stenosis of bilateral carotid arteries: Secondary | ICD-10-CM | POA: Diagnosis not present

## 2023-09-08 DIAGNOSIS — M5136 Other intervertebral disc degeneration, lumbar region: Secondary | ICD-10-CM | POA: Diagnosis not present

## 2023-09-08 DIAGNOSIS — M549 Dorsalgia, unspecified: Secondary | ICD-10-CM | POA: Diagnosis not present

## 2023-09-08 DIAGNOSIS — S0219XA Other fracture of base of skull, initial encounter for closed fracture: Secondary | ICD-10-CM | POA: Diagnosis not present

## 2023-09-08 NOTE — Therapy (Signed)
OUTPATIENT PHYSICAL THERAPY NEURO TREATMENT   Patient Name: Gregory [name] MRN: [id number] DOB:[birth year], 72 y.o., male Today's Date: 09/08/2023   PCP: Ronnald Nian, MD REFERRING PROVIDER: Willeen Niece, MD  END OF SESSION:  PT End of Session - 09/08/23 1543     Visit Number 2    Number of Visits 17    Date for PT Re-Evaluation 11/10/23    Authorization Type [employer]    PT Start Time 1540    PT Stop Time 1638    PT Time Calculation (min) 58 min    Equipment Utilized During Treatment Gait belt    Activity Tolerance Patient tolerated treatment well;Patient limited by pain    Behavior During Therapy WFL for tasks assessed/performed             Past Medical History:  Diagnosis Date   Alcohol abuse    Aortic stenosis    Arthritis    Asthma    exercise indused or pollen-uses inhaler dail yand has rescue inhaler if needed   Bipolar disorder (HCC)    CAD (coronary artery disease)    coronary calcifications 08/2013 CTA   Carotid artery occlusion    Cataract    bilateral sx   Chronic back pain    Claudication (HCC)    Complication of anesthesia 10/16/2020   "had a panic attack when the mask was put on me"" I do not think I was given anything  before, I need something to calm me down"   Depression    Family history of skin cancer    Heart murmur    as an infant   Hepatitis    h/o 1970,doesn't remember which type,1990 epstain-barr   Herpes zoster without complication 09/05/2019   Hyperlipidemia    Hypertension    Neuromuscular disorder (HCC)    Obsessive compulsive disorder    PAD (peripheral artery disease) (HCC)    Peripheral neuropathy    Pneumonia    RBBB    RBBB (right bundle branch block with left anterior fascicular block)    Seasonal allergies    Severe aortic stenosis    Sleep apnea    does not wear CPAP   Smoker    Spinal stenosis at L4-L5 level    Tobacco abuse    Tobacco use disorder    Past Surgical History:  Procedure  Laterality Date   ANAL FISTULECTOMY  09/25/2011   AORTA - BILATERAL FEMORAL ARTERY BYPASS GRAFT N/A 10/03/2020   Procedure: AORTA BIFEMORAL BYPASS GRAFT USING A HEMASHIELD GOLD BIFURCATED 14 X 7mm GRAFT AND INFERIOR MESENTERIC ARTERY REIMPLANTATION;  Surgeon: Nada Libman, MD;  Location: [facility] OR;  Service: Vascular;  Laterality: N/A;   AORTIC VALVE REPLACEMENT N/A 11/24/2019   Procedure: AORTIC VALVE REPLACEMENT (AVR) using INSPIRIS Resilia 23 MM Bioprosthetic Aortic Valve.;  Surgeon: Alleen Borne, MD;  Location: [facility] OR;  Service: Open Heart Surgery;  Laterality: N/A;   APPLICATION OF WOUND VAC Left 10/17/2020   Procedure: APPLICATION OF WOUND VAC;  Surgeon: Nada Libman, MD;  Location: [facility] OR;  Service: Vascular;  Laterality: Left;   BACK SURGERY     2015   CATARACT EXTRACTION, BILATERAL  2022   COLONOSCOPY  2009   MS-F/V-mov(exc)-HPP   CORONARY ARTERY BYPASS GRAFT N/A 11/24/2019   Procedure: CORONARY ARTERY BYPASS GRAFTING (CABG) using LIMA to LAD.;  Surgeon: Alleen Borne, MD;  Location: [facility] OR;  Service: Open Heart Surgery;  Laterality: N/A;   ELBOW SURGERY  bilaterally for cubital tunnel   ENDARTERECTOMY N/A 10/03/2020   Procedure: AORTIC ENDARTERECTOMY;  Surgeon: Nada Libman, MD;  Location: [facility];  Service: Vascular;  Laterality: N/A;   ENDARTERECTOMY FEMORAL Bilateral 10/03/2020   Procedure: BILATERAL FEMORAL ENDARTERECTOMY;  Surgeon: Nada Libman, MD;  Location: [facility];  Service: Vascular;  Laterality: Bilateral;   HERNIA REPAIR     with mesh   INCISION AND DRAINAGE Left 01/02/2021   Procedure: INCISION AND DRAINAGE LEFT GROIN WITH STIMULAN BEADS;  Surgeon: Nada Libman, MD;  Location: [facility];  Service: Vascular;  Laterality: Left;   INCISION AND DRAINAGE OF WOUND Left 10/17/2020   Procedure: EXPLORATION LEFT GROIN WOUND;  Surgeon: Nada Libman, MD;  Location: [facility];  Service: Vascular;  Laterality: Left;   MAXIMUM ACCESS (MAS)POSTERIOR LUMBAR INTERBODY  FUSION (PLIF) 1 LEVEL N/A 10/25/2014   Procedure: LUMBAR FOUR TO FIVE MAXIMUM ACCESS (MAS) POSTERIOR LUMBAR INTERBODY FUSION (PLIF) 1 LEVEL;  Surgeon: Tia Alert, MD;  Location: [facility]O [facility];  Service: Neurosurgery;  Laterality: N/A;   RIGHT/LEFT HEART CATH AND CORONARY ANGIOGRAPHY N/A 11/10/2019   Procedure: RIGHT/LEFT HEART CATH AND CORONARY ANGIOGRAPHY;  Surgeon: Sw[place]il[place], [name], MD;  Location: [facility] INVASIVE CV LAB;  Service: Cardiovascular;  Laterality: N/A;   SKIN BIOPSY Left 10/12/2018   shave forehead Hypertrophic actinic kertosis with features of a verruca   TEE WITHOUT CARDIOVERSION N/A 11/24/2019   Procedure: TRANSESOPHAGEAL ECHOCARDIOGRAM (TEE);  Surgeon: Alleen Borne, MD;  Location: [facility] OR;  Service: Open Heart Surgery;  Laterality: N/A;   TONSILLECTOMY  age 67   VASECTOMY     X 2   VASECTOMY REVERSAL     Patient Active Problem List   Diagnosis Date Noted   Subarachnoid hemorrhage following injury (HCC) 08/26/2023   Skull fracture with cerebral contusion (HCC) 08/26/2023   Temporal bone fracture (HCC) 08/26/2023   Aortic atherosclerosis (HCC) 05/26/2023   Acute non-recurrent pansinusitis 01/04/2023   Perennial allergic rhinitis 07/09/2022   Asthma-COPD overlap syndrome 04/21/2022   Recurrent infections 04/21/2022   S/P aortobifemoral bypass surgery 04/03/2021   S/P CABG x 1 11/24/2019   S/P aortic valve replacement with bioprosthetic valve 11/24/2019   PAD (peripheral artery disease) (HCC)    Mild persistent asthma 10/29/2019   RBBB (right bundle branch block with left anterior fascicular block)    Peripheral neuropathy    S/P lumbar spinal fusion 10/25/2014   Recovering alcoholic in remission (HCC) 08/10/2014   Substance induced mood disorder (HCC) 06/30/2014   Substance-induced sleep disorder (HCC) 06/30/2014   Obsessive compulsive disorder    OSA (obstructive sleep apnea) 01/20/2014   CAD (coronary artery disease)    Mentally disabled 11/25/2012   Hyperlipidemia  with target LDL less than 130 11/25/2012   Bipolar disorder (HCC)    Hypertension    Allergic rhinitis due to pollen 08/11/2011    ONSET DATE: 08/28/2023  REFERRING DIAG: R55 (ICD-10-CM) - Syncope, unspecified syncope type I60.9 (ICD-10-CM) - Subarachnoid hemorrhage (HCC)  THERAPY DIAG:  Unsteadiness on feet  Other abnormalities of gait and mobility  Muscle weakness (generalized)  History of falling  Dizziness and giddiness  Rationale for Evaluation and Treatment: Rehabilitation  SUBJECTIVE:  SUBJECTIVE STATEMENT: HOH on L side due to temporal bone fx  Pt reports he is doing alright. States he followed up with neurosurgeon and had new CT done of head and lumbar spine given pain. Results are in but patient has not had results reviewed yet. Denies fall/near falls since last here.   Pt accompanied by:  Wife, Chales Abrahams   PERTINENT HISTORY: CABG, aortic valve replacement (2021)  PAIN:  Are you having pain? Yes: NPRS scale: 8, 4-5/10 Pain location: Low back, head  Pain description: In low back, achy. In head, disorientation Aggravating factors: Movement  Relieving factors: Ice   PRECAUTIONS: Fall and Other: no bending forward (skull fx)   RED FLAGS: None   WEIGHT BEARING RESTRICTIONS: No  FALLS: Has patient fallen in last 6 months? Yes. Number of falls 2 (hit head both times)   LIVING ENVIRONMENT: Lives with: lives with their spouse Lives in: House/apartment Stairs: Yes: External: 4 steps; on left going up Has following equipment at home: Dan Humphreys - 2 wheeled and shower chair  PLOF: Independent  PATIENT GOALS: "To reduce my symptoms."  OBJECTIVE:   DIAGNOSTIC FINDINGS:   CT Head wo Contrast 09/08/2023 IMPRESSION: 1. Expected evolution of multicompartmental intracranial  hemorrhage since 08/28/2023, with no residual hyperdense blood. 2. 3 mm thick hypodense right parietal convexity subdural collection is unchanged and may reflect a subdural hygroma or evolving subdural hematoma. 3. Unchanged right temporal/parietal bone fracture.  CT Lumbar Spine wo Contrast 09/08/2023: IMPRESSION: 1. No acute fracture or traumatic malalignment of the lumbar spine. 2. Postsurgical changes reflecting posterior instrumented fusion and decompression at L5-S1 without residual spinal canal or neural foraminal stenosis. 3. Adjacent segment disease at L3-L4 resulting in at least moderate spinal canal stenosis and moderate bilateral neural foraminal stenosis.  CT of head from 08/27/23   IMPRESSION: 1. No significant interval change in bilateral frontal convexity subdural fluid collection measuring up to 6 mm. 2. Redemonstrated subarachnoid hemorrhage and hemorrhagic contusions along the bilateral anterior frontal lobes, slightly increased on the left measuring up 2.1 cm ( previously 1.9 cm), and unchanged along the right frontal lobe. Compared to prior exam there is interval increase in conspicuity of subarachnoid blood products along the bilateral parietal lobes, likely due to redistribution. No evidence of intraventricular extension. No hydrocephalus. 3. Redemonstrated acute nondisplaced fracture involving the right temporal bone.  COGNITION: Overall cognitive status: Impaired   SENSATION: WFL  POSTURE: rounded shoulders and forward head  BED MOBILITY:  Independent per pt  TRANSFERS: Assistive device utilized: Environmental consultant - 2 wheeled  Sit to stand: SBA Stand to sit: SBA   GAIT: Gait pattern: step through pattern, decreased stride length, antalgic, lateral hip instability, decreased trunk rotation, and trunk flexed Distance walked: Various clinic distances  Assistive device utilized: Walker - 2 wheeled Level of assistance: CGA Comments: Pt very rigid and  antalgic. No LOB noted but does require min cues to stay inside RW   FUNCTIONAL TESTS:  5x STS and MCTSIB to be assessed   PATIENT SURVEYS:  [place] to be obtained  TODAY'S TREATMENT:  Vitals:   09/08/23 1549  BP: (!) 144/53  Pulse: (!) 56   TherAct:   [place] Post Concussion Symptoms Questionnaire:  Compared to before the accident, do you now (last 24 hours) suffer from:  Headaches 3 = moderate problem  Feeling of Dizziness 3 = moderate problem  Nausea and/or vomiting 0 = not experienced  RPQ-3 (total of first 3 items) 6     Noise sensitivity (easily upset by loud noises) 3 = moderate problem  Sleep disturbances 4 = severe problem  Fatigue, tiring more easily 4 = severe problem  Being irritable, easily angered: 4 = severe problem  Feeling depressed or tearful 3 = moderate problem  Feeling frustrated or impatient 4 = severe problem  Forgetfulness, poor memory 4 = severe problem  Poor concentration 3 = moderate problem  Taking longer to think 3 = moderate problem  Blurred vision 2 = mild problem  Light sensitivity (easily upset by bright light) 3 = moderate problem  Double vision 0 = not experienced  Restlessness 4 = severe problem  RPQ-13 (total for next 13 items) 71     NMR:   VESTIBULAR ASSESSMENT:  GENERAL OBSERVATION: ambulates with 2WW, wears bifocals   SYMPTOM BEHAVIOR:  Subjective history: see initial eval  Non-Vestibular symptoms: changes in hearing, changes in vision, neck pain, and headaches; loss of consciousness at time of injury  Type of dizziness: Blurred Vision, Imbalance (Disequilibrium), Unsteady with head/body turns, Lightheadedness/Faint, "Funny feeling in the head", and "World moves"  Frequency: at least 1x a day  Duration: ~ 30 seconds   Aggravating factors:  standing up too quickly, reading and watching too much tv  Relieving factors:  rest  Progression of symptoms: better  OCULOMOTOR EXAM:  Ocular Alignment: normal  Ocular ROM: No Limitations  Spontaneous Nystagmus: absent  Gaze-Induced Nystagmus: absent  Smooth Pursuits: saccades  Saccades: hypometric/undershoots, hypermetric/overshoots, and worse with horizontal  Convergence/Divergence: 10 cm  (Exotropic drift noted of R eye when reported double)  Test of Skew: drift out and up with cover and uncover  VESTIBULAR - OCULAR REFLEX:  Held due to extent of occular deficits leading to inaccurate readings otherwise   POSITIONAL TESTING: Not performed   MOTION SENSITIVITY:    Motion Sensitivity Quotient Intensity: 0 = none, 1 = Lightheaded, 2 = Mild, 3 = Moderate, 4 = Severe, 5 = Vomiting  Intensity  1. Sitting to supine   2. Supine to L side   3. Supine to R side   4. Supine to sitting   5. L Hallpike-Dix   6. Up from L    7. R Hallpike-Dix   8. Up from R    9. Sitting, head  tipped to L knee   10. Head up from L  knee   11. Sitting, head  tipped to R knee   12. Head up from R  knee   13. Sitting head turns x5 2  14.Sitting head nods x5 1  15. In stance, 180  turn to L    16. In stance, 180  turn to R      FUNCTIONAL OUTCOMES:    [facility] PT Assessment - 09/08/23 0001       Standardized Balance Assessment   Standardized Balance Assessment Five Times Sit to Stand    Five times sit to stand comments  15.65   seconds (SBA with mild increase in LBP)            M-CTSIB  Condition 1: Firm Surface,  EO 30 Sec, Minor Sway  Condition 2: Firm Surface, EC 6 Sec, Mod Sway  Condition 3: Foam Surface, EO 30 Sec, Normal Sway  Condition 4: Foam Surface, EC 20 Sec, Mod Sway   Created/Reviewed Initial HEP:   - Corner Balance Feet Together With Eyes Closed - goal of working up to 30 seconds hold with feet apart as needed moving in - Pencil Pushups  - 10 reps - Seated Horizontal Saccades - 10 reps - Seated Vertical Saccades - 10 reps (verbally reviewed  only)  PATIENT EDUCATION: Education details: examination findings + initial HEP Person educated: Patient and Spouse Education method: Explanation and Handouts Education comprehension: verbalized understanding and needs further education  HOME EXERCISE PROGRAM:  Access Code: [email or web address] URL: [email or web address] Date: 09/08/2023 Prepared by: Maryruth Eve  Exercises - Corner Balance Feet Together With Eyes Closed  - 1 x daily - 7 x weekly - 3 sets - 30 seconds hold - Pencil Pushups  - 1 x daily - 7 x weekly - 3 sets - 10 reps - Seated Horizontal Saccades  - 1 x daily - 7 x weekly - 3 sets - 10 reps - Seated Vertical Saccades  - 1 x daily - 7 x weekly - 3 sets - 10 reps  GOALS: Goals reviewed with patient? Yes  SHORT TERM GOALS: Target date: 09/29/2023   Pt will be independent with initial HEP for improved balance, transfers and gait.  Baseline: not established on eval  Goal status: INITIAL  2.  Patient will improve RPQ-3 score to </= 2 per item to indicate reduced severity of post-concussive symptoms to progress towards PLOF.    Baseline: 3/4 Goal status: INITIAL  3.  5x STS to be assessed and STG/LTG updated  Baseline: 15.65 seconds without UE use (SBA) Goal status: Discontinued STG due to how well performed   4.  Patient will improve mCTSIB score to 100/120 to indicate improved integration of vestibular, proprioceptive, and visual balance systems during static balance tasks in order to reduce risk for falls.   Baseline: 86/120 Goal status: INITIAL   LONG TERM GOALS: Target date: 10/27/2023   Pt will be independent with final HEP for improved balance, transfers and gait.  Baseline:  Goal status: INITIAL  2.  Patient will improve RPQ-13 score to </= 2 per item to indicate reduced severity of post-concussive symptoms to progress towards PLOF.   Baseline: greatest deficit reported 4/4  Goal status: INITIAL  3.  Patient will improve their 5x Sit to Stand  score to less than 13 seconds to demonstrate a decreased risk for falls and improved LE strength.   Baseline: 15.65 seconds without UE use (SBA) Goal status: INITIAL  4. Patient will improve mCTSIB score to 120/120 to indicate improved integration of vestibular, proprioceptive, and visual balance systems during static balance tasks in order to reduce risk for falls.   Baseline: 86/120 Goal status: INITIAL   ASSESSMENT:  CLINICAL IMPRESSION: Session emphasized extensive post-concussive, oculomotor, and vestibular screen with creation of initial HEP to address deficits. Patient presents with notable occulomotor deficits including exotropic eye movements with convergence with abnormal tolerance for ~10 centimeters before seeing double. Other abnormal findings included positive test of skew, saccades with smooth pursuits, and overshoots/undershoots greater in horizontal direction with saccades consistent with known central involvement. Provided HEP to begin to address deficits and consider neuropthomology eval pending progress. Patient also presents with mild dizziness as indicated by MSQ results, decreased vestibular/proprioceptive integration as indicated by mCTSIB, and increased  risk for falls as indicated by 5xSTS. Patient will benefit from skilled physical therapy services to address deficits in order to progress towards PLOF and maximize modified independence.   OBJECTIVE IMPAIRMENTS: Abnormal gait, decreased activity tolerance, decreased balance, decreased cognition, decreased coordination, decreased endurance, decreased knowledge of condition, decreased knowledge of use of DME, decreased mobility, difficulty walking, decreased strength, decreased safety awareness, dizziness, impaired perceived functional ability, impaired vision/preception, and pain  ACTIVITY LIMITATIONS: carrying, lifting, bending, sleeping, stairs, transfers, hygiene/grooming, locomotion level, and caring for  others  PARTICIPATION LIMITATIONS: meal prep, cleaning, laundry, interpersonal relationship, driving, shopping, community activity, occupation, and yard work  PERSONAL FACTORS: Fitness, Past/current experiences, and 3+ comorbidities: Bradycardia, post-concussion w/LOC,   are also affecting patient's functional outcome.   REHAB POTENTIAL: Good  CLINICAL DECISION MAKING: Unstable/unpredictable  EVALUATION COMPLEXITY: High  PLAN:  PT FREQUENCY: 2x/week  PT DURATION: 8 weeks  PLANNED INTERVENTIONS: Therapeutic exercises, Therapeutic activity, Neuromuscular re-education, Balance training, Gait training, Patient/Family education, Self Care, Joint mobilization, Stair training, Vestibular training, Canalith repositioning, DME instructions, Aquatic Therapy, Dry Needling, Electrical stimulation, Manual therapy, and Re-evaluation  PLAN FOR NEXT SESSION: progress occulomotor/vestibular exercises (VORx1 as tolerated, Brock string), progress corner balance (EC on foam, tandem with head turns as able etc), consider neuropthomology referral pending progress; how is HEP going?  Carmelia Bake, PT, DPT 09/08/2023, 5:01 PM

## 2023-09-10 ENCOUNTER — Ambulatory Visit: Payer: PPO

## 2023-09-10 DIAGNOSIS — R2689 Other abnormalities of gait and mobility: Secondary | ICD-10-CM

## 2023-09-10 DIAGNOSIS — M48061 Spinal stenosis, lumbar region without neurogenic claudication: Secondary | ICD-10-CM | POA: Diagnosis not present

## 2023-09-10 DIAGNOSIS — R2681 Unsteadiness on feet: Secondary | ICD-10-CM | POA: Diagnosis not present

## 2023-09-10 DIAGNOSIS — S069X1A Unspecified intracranial injury with loss of consciousness of 30 minutes or less, initial encounter: Secondary | ICD-10-CM | POA: Diagnosis not present

## 2023-09-10 DIAGNOSIS — M6281 Muscle weakness (generalized): Secondary | ICD-10-CM

## 2023-09-10 DIAGNOSIS — R42 Dizziness and giddiness: Secondary | ICD-10-CM

## 2023-09-10 DIAGNOSIS — Z9181 History of falling: Secondary | ICD-10-CM

## 2023-09-10 NOTE — Therapy (Signed)
OUTPATIENT PHYSICAL THERAPY NEURO TREATMENT   Patient Name: Gregory [name] MRN: [id number] DOB:[birth year], 72 y.o., male Today's Date: 09/10/2023   PCP: Ronnald Nian, MD REFERRING PROVIDER: Willeen Niece, MD  END OF SESSION:  PT End of Session - 09/10/23 1403     Visit Number 3    Number of Visits 17    Date for PT Re-Evaluation 11/10/23    Authorization Type [employer]    PT Start Time 1401    PT Stop Time 1450    PT Time Calculation (min) 49 min    Activity Tolerance Patient tolerated treatment well    Behavior During Therapy WFL for tasks assessed/performed             Past Medical History:  Diagnosis Date   Alcohol abuse    Aortic stenosis    Arthritis    Asthma    exercise indused or pollen-uses inhaler dail yand has rescue inhaler if needed   Bipolar disorder (HCC)    CAD (coronary artery disease)    coronary calcifications 08/2013 CTA   Carotid artery occlusion    Cataract    bilateral sx   Chronic back pain    Claudication (HCC)    Complication of anesthesia 10/16/2020   "had a panic attack when the mask was put on me"" I do not think I was given anything  before, I need something to calm me down"   Depression    Family history of skin cancer    Heart murmur    as an infant   Hepatitis    h/o 1970,doesn't remember which type,1990 epstain-barr   Herpes zoster without complication 09/05/2019   Hyperlipidemia    Hypertension    Neuromuscular disorder (HCC)    Obsessive compulsive disorder    PAD (peripheral artery disease) (HCC)    Peripheral neuropathy    Pneumonia    RBBB    RBBB (right bundle branch block with left anterior fascicular block)    Seasonal allergies    Severe aortic stenosis    Sleep apnea    does not wear CPAP   Smoker    Spinal stenosis at L4-L5 level    Tobacco abuse    Tobacco use disorder    Past Surgical History:  Procedure Laterality Date   ANAL FISTULECTOMY  09/25/2011   AORTA - BILATERAL  FEMORAL ARTERY BYPASS GRAFT N/A 10/03/2020   Procedure: AORTA BIFEMORAL BYPASS GRAFT USING A HEMASHIELD GOLD BIFURCATED 14 X 7mm GRAFT AND INFERIOR MESENTERIC ARTERY REIMPLANTATION;  Surgeon: Nada Libman, MD;  Location: [facility] OR;  Service: Vascular;  Laterality: N/A;   AORTIC VALVE REPLACEMENT N/A 11/24/2019   Procedure: AORTIC VALVE REPLACEMENT (AVR) using INSPIRIS Resilia 23 MM Bioprosthetic Aortic Valve.;  Surgeon: Alleen Borne, MD;  Location: [facility] OR;  Service: Open Heart Surgery;  Laterality: N/A;   APPLICATION OF WOUND VAC Left 10/17/2020   Procedure: APPLICATION OF WOUND VAC;  Surgeon: Nada Libman, MD;  Location: [facility] OR;  Service: Vascular;  Laterality: Left;   BACK SURGERY     2015   CATARACT EXTRACTION, BILATERAL  2022   COLONOSCOPY  2009   MS-F/V-mov(exc)-HPP   CORONARY ARTERY BYPASS GRAFT N/A 11/24/2019   Procedure: CORONARY ARTERY BYPASS GRAFTING (CABG) using LIMA to LAD.;  Surgeon: Alleen Borne, MD;  Location: [facility] OR;  Service: Open Heart Surgery;  Laterality: N/A;   ELBOW SURGERY     bilaterally for cubital tunnel   ENDARTERECTOMY N/A 10/03/2020  Procedure: AORTIC ENDARTERECTOMY;  Surgeon: Nada Libman, MD;  Location: [facility] OR;  Service: Vascular;  Laterality: N/A;   ENDARTERECTOMY FEMORAL Bilateral 10/03/2020   Procedure: BILATERAL FEMORAL ENDARTERECTOMY;  Surgeon: Nada Libman, MD;  Location: [facility] OR;  Service: Vascular;  Laterality: Bilateral;   HERNIA REPAIR     with mesh   INCISION AND DRAINAGE Left 01/02/2021   Procedure: INCISION AND DRAINAGE LEFT GROIN WITH STIMULAN BEADS;  Surgeon: Nada Libman, MD;  Location: [facility] OR;  Service: Vascular;  Laterality: Left;   INCISION AND DRAINAGE OF WOUND Left 10/17/2020   Procedure: EXPLORATION LEFT GROIN WOUND;  Surgeon: Nada Libman, MD;  Location: [facility] OR;  Service: Vascular;  Laterality: Left;   MAXIMUM ACCESS (MAS)POSTERIOR LUMBAR INTERBODY FUSION (PLIF) 1 LEVEL N/A 10/25/2014   Procedure: LUMBAR FOUR TO FIVE  MAXIMUM ACCESS (MAS) POSTERIOR LUMBAR INTERBODY FUSION (PLIF) 1 LEVEL;  Surgeon: Tia Alert, MD;  Location: [facility] NEURO [facility];  Service: Neurosurgery;  Laterality: N/A;   RIGHT/LEFT HEART CATH AND CORONARY ANGIOGRAPHY N/A 11/10/2019   Procedure: RIGHT/LEFT HEART CATH AND CORONARY ANGIOGRAPHY;  Surgeon: Swaziland, [name], MD;  Location: [facility] INVASIVE CV LAB;  Service: Cardiovascular;  Laterality: N/A;   SKIN BIOPSY Left 10/12/2018   shave forehead Hypertrophic actinic kertosis with features of a verruca   TEE WITHOUT CARDIOVERSION N/A 11/24/2019   Procedure: TRANSESOPHAGEAL ECHOCARDIOGRAM (TEE);  Surgeon: Alleen Borne, MD;  Location: [facility] OR;  Service: Open Heart Surgery;  Laterality: N/A;   TONSILLECTOMY  age 49   VASECTOMY     X 2   VASECTOMY REVERSAL     Patient Active Problem List   Diagnosis Date Noted   Subarachnoid hemorrhage following injury (HCC) 08/26/2023   Skull fracture with cerebral contusion (HCC) 08/26/2023   Temporal bone fracture (HCC) 08/26/2023   Aortic atherosclerosis (HCC) 05/26/2023   Acute non-recurrent pansinusitis 01/04/2023   Perennial allergic rhinitis 07/09/2022   Asthma-COPD overlap syndrome 04/21/2022   Recurrent infections 04/21/2022   S/P aortobifemoral bypass surgery 04/03/2021   S/P CABG x 1 11/24/2019   S/P aortic valve replacement with bioprosthetic valve 11/24/2019   PAD (peripheral artery disease) (HCC)    Mild persistent asthma 10/29/2019   RBBB (right bundle branch block with left anterior fascicular block)    Peripheral neuropathy    S/P lumbar spinal fusion 10/25/2014   Recovering alcoholic in remission (HCC) 08/10/2014   Substance induced mood disorder (HCC) 06/30/2014   Substance-induced sleep disorder (HCC) 06/30/2014   Obsessive compulsive disorder    OSA (obstructive sleep apnea) 01/20/2014   CAD (coronary artery disease)    Mentally disabled 11/25/2012   Hyperlipidemia with target LDL less than 130 11/25/2012   Bipolar disorder (HCC)     Hypertension    Allergic rhinitis due to pollen 08/11/2011    ONSET DATE: 08/28/2023  REFERRING DIAG: R55 (ICD-10-CM) - Syncope, unspecified syncope type I60.9 (ICD-10-CM) - Subarachnoid hemorrhage (HCC)  THERAPY DIAG:  Unsteadiness on feet  Other abnormalities of gait and mobility  Muscle weakness (generalized)  History of falling  Dizziness and giddiness  Rationale for Evaluation and Treatment: Rehabilitation  SUBJECTIVE:  SUBJECTIVE STATEMENT: HOH on L side due to temporal bone fx  Pt reports he is doing alright. States he followed up with neurosurgeon and had new CT done of head and lumbar spine given pain. Results are in but patient has not had results reviewed yet. Denies fall/near falls since last here.   Pt accompanied by:  Wife, Chales Abrahams   PERTINENT HISTORY: CABG, aortic valve replacement (2021)  PAIN:  Are you having pain? Yes: NPRS scale: 8, 4-5/10 Pain location: Low back, head  Pain description: In low back, achy. In head, disorientation Aggravating factors: Movement  Relieving factors: Ice   PRECAUTIONS: Fall and Other: no bending forward (skull fx)   RED FLAGS: None   WEIGHT BEARING RESTRICTIONS: No  FALLS: Has patient fallen in last 6 months? Yes. Number of falls 2 (hit head both times)   LIVING ENVIRONMENT: Lives with: lives with their spouse Lives in: House/apartment Stairs: Yes: External: 4 steps; on left going up Has following equipment at home: Dan Humphreys - 2 wheeled and shower chair  PLOF: Independent  PATIENT GOALS: "To reduce my symptoms."  OBJECTIVE:   DIAGNOSTIC FINDINGS:   CT Head wo Contrast 09/08/2023 IMPRESSION: 1. Expected evolution of multicompartmental intracranial hemorrhage since 08/28/2023, with no residual hyperdense blood. 2. 3 mm  thick hypodense right parietal convexity subdural collection is unchanged and may reflect a subdural hygroma or evolving subdural hematoma. 3. Unchanged right temporal/parietal bone fracture.  CT Lumbar Spine wo Contrast 09/08/2023: IMPRESSION: 1. No acute fracture or traumatic malalignment of the lumbar spine. 2. Postsurgical changes reflecting posterior instrumented fusion and decompression at L5-S1 without residual spinal canal or neural foraminal stenosis. 3. Adjacent segment disease at L3-L4 resulting in at least moderate spinal canal stenosis and moderate bilateral neural foraminal stenosis.  CT of head from 08/27/23   IMPRESSION: 1. No significant interval change in bilateral frontal convexity subdural fluid collection measuring up to 6 mm. 2. Redemonstrated subarachnoid hemorrhage and hemorrhagic contusions along the bilateral anterior frontal lobes, slightly increased on the left measuring up 2.1 cm ( previously 1.9 cm), and unchanged along the right frontal lobe. Compared to prior exam there is interval increase in conspicuity of subarachnoid blood products along the bilateral parietal lobes, likely due to redistribution. No evidence of intraventricular extension. No hydrocephalus. 3. Redemonstrated acute nondisplaced fracture involving the right temporal bone.  COGNITION: Overall cognitive status: Impaired   SENSATION: WFL  POSTURE: rounded shoulders and forward head  BED MOBILITY:  Independent per pt  TRANSFERS: Assistive device utilized: Environmental consultant - 2 wheeled  Sit to stand: SBA Stand to sit: SBA   GAIT: Gait pattern: step through pattern, decreased stride length, antalgic, lateral hip instability, decreased trunk rotation, and trunk flexed Distance walked: Various clinic distances  Assistive device utilized: Walker - 2 wheeled Level of assistance: CGA Comments: Pt very rigid and antalgic. No LOB noted but does require min cues to stay inside RW    FUNCTIONAL TESTS:  5x STS and MCTSIB to be assessed   PATIENT SURVEYS:  [place] to be obtained  TODAY'S TREATMENT:            NMR:  -King Devick's saccades x2  -horizontal saccades plain background -[place] eye tracking with underlining  -discussed prognosis for return to work and return to driving (guarded)   PATIENT EDUCATION: Education details: additions to HEP Person educated: Patient and Spouse Education method: Explanation and Handouts Education comprehension: verbalized understanding and needs further education  HOME EXERCISE PROGRAM:  Access Code: [email or web address]  URL: [email or web address] Date: 09/08/2023 Prepared by: Maryruth Eve  Exercises - Corner Balance Feet Together With Eyes Closed  - 1 x daily - 7 x weekly - 3 sets - 30 seconds hold - Pencil Pushups  - 1 x daily - 7 x weekly - 3 sets - 10 reps - Seated Horizontal Saccades  - 1 x daily - 7 x weekly - 3 sets - 10 reps - Seated Vertical Saccades  - 1 x daily - 7 x weekly - 3 sets - 10 reps -[place] eye tracking (1 paragraph)  -King Devick's Saccades 1x a day  GOALS: Goals reviewed with patient? Yes  SHORT TERM GOALS: Target date: 09/29/2023   Pt will be independent with initial HEP for improved balance, transfers and gait.  Baseline: not established on eval  Goal status: INITIAL  2.  Patient will improve RPQ-3 score to </= 2 per item to indicate reduced severity of post-concussive symptoms to progress towards PLOF.    Baseline: 3/4 Goal status: INITIAL  3.  5x STS to be assessed and STG/LTG updated  Baseline: 15.65 seconds without UE use (SBA) Goal status: Discontinued STG due to how well performed   4.  Patient will improve mCTSIB score to 100/120 to indicate improved integration of vestibular, proprioceptive, and visual balance systems during static balance tasks in order to reduce risk for falls.   Baseline: 86/120 Goal status: INITIAL   LONG TERM GOALS: Target date:  10/27/2023   Pt will be independent with final HEP for improved balance, transfers and gait.  Baseline:  Goal status: INITIAL  2.  Patient will improve RPQ-13 score to </= 2 per item to indicate reduced severity of post-concussive symptoms to progress towards PLOF.   Baseline: greatest deficit reported 4/4  Goal status: INITIAL  3.  Patient will improve their 5x Sit to Stand score to less than 13 seconds to demonstrate a decreased risk for falls and improved LE strength.   Baseline: 15.65 seconds without UE use (SBA) Goal status: INITIAL  4. Patient will improve mCTSIB score to 120/120 to indicate improved integration of vestibular, proprioceptive, and visual balance systems during static balance tasks in order to reduce risk for falls.   Baseline: 86/120 Goal status: INITIAL   ASSESSMENT:  CLINICAL IMPRESSION: Patient seen for skilled PT session with emphasis on vision re-training. Remains with relatively saccadic eye movement, primarily to the R, but with prolonged activity, will develop to the L as well. Limited awareness of errors with exercises, especially with [place] eye tracking, more likely to miss in R periphery, but denies visual field deficits. Wife reporting that patient was found "talking to" the coleslaw in the fridge and trying to "swipe it" as if it were a picture on his phone. Wife reports that this is the first time this has happened since the early days post initial injury. PT needing to remind patient to not drive and not use sharp kitchen tools (is a Investment banker, operational by profession). Continue POC.    OBJECTIVE IMPAIRMENTS: Abnormal gait, decreased activity tolerance, decreased balance, decreased cognition, decreased coordination, decreased endurance, decreased knowledge of condition, decreased knowledge of use of DME, decreased mobility, difficulty walking, decreased strength, decreased safety awareness, dizziness, impaired perceived functional ability, impaired vision/preception,  and pain  ACTIVITY LIMITATIONS: carrying, lifting, bending, sleeping, stairs, transfers, hygiene/grooming, locomotion level, and caring for others  PARTICIPATION LIMITATIONS: meal prep, cleaning, laundry, interpersonal relationship, driving, shopping, community activity, occupation, and yard work  PERSONAL FACTORS: Fitness, Past/current experiences, and 3+ comorbidities:  Bradycardia, post-concussion w/LOC,   are also affecting patient's functional outcome.   REHAB POTENTIAL: Good  CLINICAL DECISION MAKING: Unstable/unpredictable  EVALUATION COMPLEXITY: High  PLAN:  PT FREQUENCY: 2x/week  PT DURATION: 8 weeks  PLANNED INTERVENTIONS: Therapeutic exercises, Therapeutic activity, Neuromuscular re-education, Balance training, Gait training, Patient/Family education, Self Care, Joint mobilization, Stair training, Vestibular training, Canalith repositioning, DME instructions, Aquatic Therapy, Dry Needling, Electrical stimulation, Manual therapy, and Re-evaluation  PLAN FOR NEXT SESSION: progress occulomotor/vestibular exercises (VORx1 as tolerated, Brock string), progress corner balance (EC on foam, tandem with head turns as able etc), consider neuropthomology referral pending progress; how is HEP going?, referral to speech?  Westley Foots, PT Westley Foots, PT, DPT, CBIS  09/10/2023, 3:55 PM

## 2023-09-11 ENCOUNTER — Telehealth: Payer: Self-pay

## 2023-09-11 NOTE — Telephone Encounter (Signed)
Dr. Susann Givens,  Gregory [name] was evaluated by PT on 09/01/23.  The patient would benefit from a speech therapy evaluation for memory/cognition and an OT eval for visual deficits s/p TBI.    If you agree, please place an order in [facility] workque in [facility] or fax the order to [phone].  Thank you, Westley Foots, PT, DPT, [facility] [address] [place], [place]  [address] Phone:  [phone] Fax:  [phone]

## 2023-09-13 NOTE — Telephone Encounter (Signed)
Please handle referral

## 2023-09-14 ENCOUNTER — Other Ambulatory Visit: Payer: Self-pay

## 2023-09-14 DIAGNOSIS — R413 Other amnesia: Secondary | ICD-10-CM

## 2023-09-14 DIAGNOSIS — S069X1D Unspecified intracranial injury with loss of consciousness of 30 minutes or less, subsequent encounter: Secondary | ICD-10-CM

## 2023-09-15 ENCOUNTER — Ambulatory Visit: Payer: PPO | Admitting: Physical Therapy

## 2023-09-15 ENCOUNTER — Other Ambulatory Visit: Payer: Self-pay

## 2023-09-15 ENCOUNTER — Encounter: Payer: Self-pay | Admitting: Physical Therapy

## 2023-09-15 DIAGNOSIS — M6281 Muscle weakness (generalized): Secondary | ICD-10-CM

## 2023-09-15 DIAGNOSIS — R2681 Unsteadiness on feet: Secondary | ICD-10-CM | POA: Diagnosis not present

## 2023-09-15 DIAGNOSIS — Z9181 History of falling: Secondary | ICD-10-CM

## 2023-09-15 DIAGNOSIS — R42 Dizziness and giddiness: Secondary | ICD-10-CM

## 2023-09-15 DIAGNOSIS — R413 Other amnesia: Secondary | ICD-10-CM

## 2023-09-15 DIAGNOSIS — S069X1D Unspecified intracranial injury with loss of consciousness of 30 minutes or less, subsequent encounter: Secondary | ICD-10-CM

## 2023-09-15 DIAGNOSIS — R2689 Other abnormalities of gait and mobility: Secondary | ICD-10-CM

## 2023-09-15 NOTE — Therapy (Signed)
OUTPATIENT PHYSICAL THERAPY NEURO TREATMENT   Patient Name: JADELL VELTRI MRN: [id number] DOB:12/31/1950, 72 y.o., male Today's Date: 09/15/2023   PCP: Ronnald Nian, MD REFERRING PROVIDER: Willeen Niece, MD  END OF SESSION:  PT End of Session - 09/15/23 1022     Visit Number 4    Number of Visits 17    Date for PT Re-Evaluation 11/10/23    Authorization Type [employer]    PT Start Time 1019    PT Stop Time 1103    PT Time Calculation (min) 44 min    Equipment Utilized During Treatment Gait belt    Activity Tolerance Patient tolerated treatment well    Behavior During Therapy WFL for tasks assessed/performed             Past Medical History:  Diagnosis Date   Alcohol abuse    Aortic stenosis    Arthritis    Asthma    exercise indused or pollen-uses inhaler dail yand has rescue inhaler if needed   Bipolar disorder (HCC)    CAD (coronary artery disease)    coronary calcifications 08/2013 CTA   Carotid artery occlusion    Cataract    bilateral sx   Chronic back pain    Claudication (HCC)    Complication of anesthesia 10/16/2020   "had a panic attack when the mask was put on me"" I do not think I was given anything  before, I need something to calm me down"   Depression    Family history of skin cancer    Heart murmur    as an infant   Hepatitis    h/o 1970,doesn't remember which type,1990 epstain-barr   Herpes zoster without complication 09/05/2019   Hyperlipidemia    Hypertension    Neuromuscular disorder (HCC)    Obsessive compulsive disorder    PAD (peripheral artery disease) (HCC)    Peripheral neuropathy    Pneumonia    RBBB    RBBB (right bundle branch block with left anterior fascicular block)    Seasonal allergies    Severe aortic stenosis    Sleep apnea    does not wear CPAP   Smoker    Spinal stenosis at L4-L5 level    Tobacco abuse    Tobacco use disorder    Past Surgical History:  Procedure Laterality Date   ANAL  FISTULECTOMY  09/25/2011   AORTA - BILATERAL FEMORAL ARTERY BYPASS GRAFT N/A 10/03/2020   Procedure: AORTA BIFEMORAL BYPASS GRAFT USING A HEMASHIELD GOLD BIFURCATED 14 X 7mm GRAFT AND INFERIOR MESENTERIC ARTERY REIMPLANTATION;  Surgeon: Nada Libman, MD;  Location: [facility] OR;  Service: Vascular;  Laterality: N/A;   AORTIC VALVE REPLACEMENT N/A 11/24/2019   Procedure: AORTIC VALVE REPLACEMENT (AVR) using INSPIRIS Resilia 23 MM Bioprosthetic Aortic Valve.;  Surgeon: Alleen Borne, MD;  Location: [facility] OR;  Service: Open Heart Surgery;  Laterality: N/A;   APPLICATION OF WOUND VAC Left 10/17/2020   Procedure: APPLICATION OF WOUND VAC;  Surgeon: Nada Libman, MD;  Location: [facility] OR;  Service: Vascular;  Laterality: Left;   BACK SURGERY     2015   CATARACT EXTRACTION, BILATERAL  2022   COLONOSCOPY  2009   MS-F/V-mov(exc)-HPP   CORONARY ARTERY BYPASS GRAFT N/A 11/24/2019   Procedure: CORONARY ARTERY BYPASS GRAFTING (CABG) using LIMA to LAD.;  Surgeon: Alleen Borne, MD;  Location: [facility] OR;  Service: Open Heart Surgery;  Laterality: N/A;   ELBOW SURGERY  bilaterally for cubital tunnel   ENDARTERECTOMY N/A 10/03/2020   Procedure: AORTIC ENDARTERECTOMY;  Surgeon: Nada Libman, MD;  Location: [facility];  Service: Vascular;  Laterality: N/A;   ENDARTERECTOMY FEMORAL Bilateral 10/03/2020   Procedure: BILATERAL FEMORAL ENDARTERECTOMY;  Surgeon: Nada Libman, MD;  Location: [facility];  Service: Vascular;  Laterality: Bilateral;   HERNIA REPAIR     with mesh   INCISION AND DRAINAGE Left 01/02/2021   Procedure: INCISION AND DRAINAGE LEFT GROIN WITH STIMULAN BEADS;  Surgeon: Nada Libman, MD;  Location: [facility];  Service: Vascular;  Laterality: Left;   INCISION AND DRAINAGE OF WOUND Left 10/17/2020   Procedure: EXPLORATION LEFT GROIN WOUND;  Surgeon: Nada Libman, MD;  Location: [facility];  Service: Vascular;  Laterality: Left;   MAXIMUM ACCESS (MAS)POSTERIOR LUMBAR INTERBODY FUSION (PLIF) 1 LEVEL N/A  10/25/2014   Procedure: LUMBAR FOUR TO FIVE MAXIMUM ACCESS (MAS) POSTERIOR LUMBAR INTERBODY FUSION (PLIF) 1 LEVEL;  Surgeon: Tia Alert, MD;  Location: [facility];  Service: Neurosurgery;  Laterality: N/A;   RIGHT/LEFT HEART CATH AND CORONARY ANGIOGRAPHY N/A 11/10/2019   Procedure: RIGHT/LEFT HEART CATH AND CORONARY ANGIOGRAPHY;  Surgeon: Swazil[place], [name], MD;  Location: [facility] INVASIVE CV LAB;  Service: Cardiovascular;  Laterality: N/A;   SKIN BIOPSY Left 10/12/2018   shave forehead Hypertrophic actinic kertosis with features of a verruca   TEE WITHOUT CARDIOVERSION N/A 11/24/2019   Procedure: TRANSESOPHAGEAL ECHOCARDIOGRAM (TEE);  Surgeon: Alleen Borne, MD;  Location: [facility] OR;  Service: Open Heart Surgery;  Laterality: N/A;   TONSILLECTOMY  age 31   VASECTOMY     X 2   VASECTOMY REVERSAL     Patient Active Problem List   Diagnosis Date Noted   Subarachnoid hemorrhage following injury (HCC) 08/26/2023   Skull fracture with cerebral contusion (HCC) 08/26/2023   Temporal bone fracture (HCC) 08/26/2023   Aortic atherosclerosis (HCC) 05/26/2023   Acute non-recurrent pansinusitis 01/04/2023   Perennial allergic rhinitis 07/09/2022   Asthma-COPD overlap syndrome 04/21/2022   Recurrent infections 04/21/2022   S/P aortobifemoral bypass surgery 04/03/2021   S/P CABG x 1 11/24/2019   S/P aortic valve replacement with bioprosthetic valve 11/24/2019   PAD (peripheral artery disease) (HCC)    Mild persistent asthma 10/29/2019   RBBB (right bundle branch block with left anterior fascicular block)    Peripheral neuropathy    S/P lumbar spinal fusion 10/25/2014   Recovering alcoholic in remission (HCC) 08/10/2014   Substance induced mood disorder (HCC) 06/30/2014   Substance-induced sleep disorder (HCC) 06/30/2014   Obsessive compulsive disorder    OSA (obstructive sleep apnea) 01/20/2014   CAD (coronary artery disease)    Mentally disabled 11/25/2012   Hyperlipidemia with target LDL less than  130 11/25/2012   Bipolar disorder (HCC)    Hypertension    Allergic rhinitis due to pollen 08/11/2011    ONSET DATE: 08/28/2023  REFERRING DIAG: R55 (ICD-10-CM) - Syncope, unspecified syncope type I60.9 (ICD-10-CM) - Subarachnoid hemorrhage (HCC)  THERAPY DIAG:  Unsteadiness on feet  Other abnormalities of gait and mobility  Muscle weakness (generalized)  History of falling  Dizziness and giddiness  Rationale for Evaluation and Treatment: Rehabilitation  SUBJECTIVE:  SUBJECTIVE STATEMENT: HOH on L side due to temporal bone fx  Pt reports he is doing alright. States he followed up with neurosurgeon and had new CT done of head and lumbar spine given pain. Results are in but patient has not had results reviewed yet. Denies fall/near falls since last here.   Pt accompanied by:  Wife, Chales Abrahams   PERTINENT HISTORY: CABG, aortic valve replacement (2021)  PAIN:  Are you having pain? Yes: NPRS scale: 8, 4-5/10 Pain location: Low back, head  Pain description: In low back, achy. In head, disorientation Aggravating factors: Movement  Relieving factors: Ice   PRECAUTIONS: Fall and Other: no bending forward (skull fx)   RED FLAGS: None   WEIGHT BEARING RESTRICTIONS: No  FALLS: Has patient fallen in last 6 months? Yes. Number of falls 2 (hit head both times)   LIVING ENVIRONMENT: Lives with: lives with their spouse Lives in: House/apartment Stairs: Yes: External: 4 steps; on left going up Has following equipment at home: Dan Humphreys - 2 wheeled and shower chair  PLOF: Independent  PATIENT GOALS: "To reduce my symptoms."  OBJECTIVE:   DIAGNOSTIC FINDINGS:   CT Head wo Contrast 09/08/2023 IMPRESSION: 1. Expected evolution of multicompartmental intracranial hemorrhage since 08/28/2023,  with no residual hyperdense blood. 2. 3 mm thick hypodense right parietal convexity subdural collection is unchanged and may reflect a subdural hygroma or evolving subdural hematoma. 3. Unchanged right temporal/parietal bone fracture.  CT Lumbar Spine wo Contrast 09/08/2023: IMPRESSION: 1. No acute fracture or traumatic malalignment of the lumbar spine. 2. Postsurgical changes reflecting posterior instrumented fusion and decompression at L5-S1 without residual spinal canal or neural foraminal stenosis. 3. Adjacent segment disease at L3-L4 resulting in at least moderate spinal canal stenosis and moderate bilateral neural foraminal stenosis.  CT of head from 08/27/23   IMPRESSION: 1. No significant interval change in bilateral frontal convexity subdural fluid collection measuring up to 6 mm. 2. Redemonstrated subarachnoid hemorrhage and hemorrhagic contusions along the bilateral anterior frontal lobes, slightly increased on the left measuring up 2.1 cm ( previously 1.9 cm), and unchanged along the right frontal lobe. Compared to prior exam there is interval increase in conspicuity of subarachnoid blood products along the bilateral parietal lobes, likely due to redistribution. No evidence of intraventricular extension. No hydrocephalus. 3. Redemonstrated acute nondisplaced fracture involving the right temporal bone.  COGNITION: Overall cognitive status: Impaired   SENSATION: WFL  POSTURE: rounded shoulders and forward head  BED MOBILITY:  Independent per pt  TRANSFERS: Assistive device utilized: Environmental consultant - 2 wheeled  Sit to stand: SBA Stand to sit: SBA   GAIT: Gait pattern: step through pattern, decreased stride length, antalgic, lateral hip instability, decreased trunk rotation, and trunk flexed Distance walked: Various clinic distances  Assistive device utilized: Walker - 2 wheeled Level of assistance: CGA Comments: Pt very rigid and antalgic. No LOB noted but does  require min cues to stay inside RW   FUNCTIONAL TESTS:  5x STS and MCTSIB to be assessed   PATIENT SURVEYS:  [place] to be obtained  TODAY'S TREATMENT:             TherAct: Reviewed extensively visual tracking exercise; answered questions about set up for [facility] saccades/[place] tracking and where to get access to printing more for more practice for homes.   NMR:  VOR cancelation tracking (performed seated and busy background) mild saccades noted 2 x 5 Spot it games for functional saccades 1 x 8 card horizontal, 1 x 5 cards  vertically Increased challenge with peripherally challenge Standing plain background with SBA colored HART chart progression Vertical reading all letters x 1 time through Horizontal reading all letters x 1 through Diagonal reading all letters x 2 through (red and green)  PATIENT EDUCATION: Education details: additions to HEP Person educated: Patient and Spouse Education method: Explanation and Handouts Education comprehension: verbalized understanding and needs further education  HOME EXERCISE PROGRAM:  Access Code: [email or web address] URL: [email or web address] Date: 09/08/2023 Prepared by: Maryruth Eve  Exercises - Corner Balance Feet Together With Eyes Closed  - 1 x daily - 7 x weekly - 3 sets - 30 seconds hold - Pencil Pushups  - 1 x daily - 7 x weekly - 3 sets - 10 reps - Seated Horizontal Saccades  - 1 x daily - 7 x weekly - 3 sets - 10 reps - Seated Vertical Saccades  - 1 x daily - 7 x weekly - 3 sets - 10 reps -[place] eye tracking (1 paragraph)  -King Devick's Saccades 1x a day   Education on SPOT it game and HART chart use, VOR x 1 viewing 3 x 5 rounds  GOALS: Goals reviewed with patient? Yes  SHORT TERM GOALS: Target date: 09/29/2023   Pt will be independent with initial HEP for improved balance, transfers and gait.  Baseline: not established on eval  Goal status: INITIAL  2.  Patient will improve RPQ-3 score to </=  2 per item to indicate reduced severity of post-concussive symptoms to progress towards PLOF.    Baseline: 3/4 Goal status: INITIAL  3.  5x STS to be assessed and STG/LTG updated  Baseline: 15.65 seconds without UE use (SBA) Goal status: Discontinued STG due to how well performed   4.  Patient will improve mCTSIB score to 100/120 to indicate improved integration of vestibular, proprioceptive, and visual balance systems during static balance tasks in order to reduce risk for falls.   Baseline: 86/120 Goal status: INITIAL   LONG TERM GOALS: Target date: 10/27/2023   Pt will be independent with final HEP for improved balance, transfers and gait.  Baseline:  Goal status: INITIAL  2.  Patient will improve RPQ-13 score to </= 2 per item to indicate reduced severity of post-concussive symptoms to progress towards PLOF.   Baseline: greatest deficit reported 4/4  Goal status: INITIAL  3.  Patient will improve their 5x Sit to Stand score to less than 13 seconds to demonstrate a decreased risk for falls and improved LE strength.   Baseline: 15.65 seconds without UE use (SBA) Goal status: INITIAL  4. Patient will improve mCTSIB score to 120/120 to indicate improved integration of vestibular, proprioceptive, and visual balance systems during static balance tasks in order to reduce risk for falls.   Baseline: 86/120 Goal status: INITIAL   ASSESSMENT:  CLINICAL IMPRESSION: Session emphasized progression of occulomotor exercises. Patient continues have deficits including peripheral visual scanning and intermittent saccades. Fewer noted today than when this therapist last worked with patient. Continue POC.   OBJECTIVE IMPAIRMENTS: Abnormal gait, decreased activity tolerance, decreased balance, decreased cognition, decreased coordination, decreased endurance, decreased knowledge of condition, decreased knowledge of use of DME, decreased mobility, difficulty walking, decreased strength, decreased  safety awareness, dizziness, impaired perceived functional ability, impaired vision/preception, and pain  ACTIVITY LIMITATIONS: carrying, lifting, bending, sleeping, stairs, transfers, hygiene/grooming, locomotion level, and caring for others  PARTICIPATION LIMITATIONS: meal prep, cleaning, laundry, interpersonal relationship, driving, shopping, community activity, occupation, and yard work  PERSONAL FACTORS: Fitness, Past/current experiences, and 3+  comorbidities: Bradycardia, post-concussion w/LOC,   are also affecting patient's functional outcome.   REHAB POTENTIAL: Good  CLINICAL DECISION MAKING: Unstable/unpredictable  EVALUATION COMPLEXITY: High  PLAN:  PT FREQUENCY: 2x/week  PT DURATION: 8 weeks  PLANNED INTERVENTIONS: Therapeutic exercises, Therapeutic activity, Neuromuscular re-education, Balance training, Gait training, Patient/Family education, Self Care, Joint mobilization, Stair training, Vestibular training, Canalith repositioning, DME instructions, Aquatic Therapy, Dry Needling, Electrical stimulation, Manual therapy, and Re-evaluation  PLAN FOR NEXT SESSION: progress occulomotor/vestibular exercises (VORx1 as tolerated, Brock string), progress corner balance (EC on foam, tandem with head turns as able etc), consider neuropthomology referral pending progress; did OT/speech get scheduled, HART chart with balance work  Carmelia Bake, PT, DPT 09/15/2023, 11:32 AM

## 2023-09-17 ENCOUNTER — Ambulatory Visit: Payer: PPO | Admitting: Physical Therapy

## 2023-09-17 ENCOUNTER — Encounter: Payer: Self-pay | Admitting: Physical Therapy

## 2023-09-17 VITALS — BP 125/51 | HR 58

## 2023-09-17 DIAGNOSIS — R42 Dizziness and giddiness: Secondary | ICD-10-CM

## 2023-09-17 DIAGNOSIS — R2681 Unsteadiness on feet: Secondary | ICD-10-CM | POA: Diagnosis not present

## 2023-09-17 DIAGNOSIS — R2689 Other abnormalities of gait and mobility: Secondary | ICD-10-CM

## 2023-09-17 DIAGNOSIS — M6281 Muscle weakness (generalized): Secondary | ICD-10-CM

## 2023-09-17 DIAGNOSIS — Z9181 History of falling: Secondary | ICD-10-CM

## 2023-09-17 NOTE — Therapy (Signed)
OUTPATIENT PHYSICAL THERAPY NEURO TREATMENT   Patient Name: Gregory [name] MRN: [id number] DOB:[birth year], 72 y.o., male Today's Date: 09/17/2023   PCP: Ronnald Nian, MD REFERRING PROVIDER: Willeen Niece, MD  END OF SESSION:  PT End of Session - 09/17/23 1406     Visit Number 5    Number of Visits 17    Date for PT Re-Evaluation 11/10/23    Authorization Type [employer]    PT Start Time 1404    PT Stop Time 1446    PT Time Calculation (min) 42 min    Equipment Utilized During Treatment Gait belt    Activity Tolerance Patient tolerated treatment well    Behavior During Therapy WFL for tasks assessed/performed             Past Medical History:  Diagnosis Date   Alcohol abuse    Aortic stenosis    Arthritis    Asthma    exercise indused or pollen-uses inhaler dail yand has rescue inhaler if needed   Bipolar disorder (HCC)    CAD (coronary artery disease)    coronary calcifications 08/2013 CTA   Carotid artery occlusion    Cataract    bilateral sx   Chronic back pain    Claudication (HCC)    Complication of anesthesia 10/16/2020   "had a panic attack when the mask was put on me"" I do not think I was given anything  before, I need something to calm me down"   Depression    Family history of skin cancer    Heart murmur    as an infant   Hepatitis    h/o 1970,doesn't remember which type,1990 epstain-barr   Herpes zoster without complication 09/05/2019   Hyperlipidemia    Hypertension    Neuromuscular disorder (HCC)    Obsessive compulsive disorder    PAD (peripheral artery disease) (HCC)    Peripheral neuropathy    Pneumonia    RBBB    RBBB (right bundle branch block with left anterior fascicular block)    Seasonal allergies    Severe aortic stenosis    Sleep apnea    does not wear CPAP   Smoker    Spinal stenosis at L4-L5 level    Tobacco abuse    Tobacco use disorder    Past Surgical History:  Procedure Laterality Date   ANAL  FISTULECTOMY  09/25/2011   AORTA - BILATERAL FEMORAL ARTERY BYPASS GRAFT N/A 10/03/2020   Procedure: AORTA BIFEMORAL BYPASS GRAFT USING A HEMASHIELD GOLD BIFURCATED 14 X 7mm GRAFT AND INFERIOR MESENTERIC ARTERY REIMPLANTATION;  Surgeon: Nada Libman, MD;  Location: [facility] OR;  Service: Vascular;  Laterality: N/A;   AORTIC VALVE REPLACEMENT N/A 11/24/2019   Procedure: AORTIC VALVE REPLACEMENT (AVR) using INSPIRIS Resilia 23 MM Bioprosthetic Aortic Valve.;  Surgeon: Alleen Borne, MD;  Location: [facility] OR;  Service: Open Heart Surgery;  Laterality: N/A;   APPLICATION OF WOUND VAC Left 10/17/2020   Procedure: APPLICATION OF WOUND VAC;  Surgeon: Nada Libman, MD;  Location: [facility] OR;  Service: Vascular;  Laterality: Left;   BACK SURGERY     2015   CATARACT EXTRACTION, BILATERAL  2022   COLONOSCOPY  2009   MS-F/V-mov(exc)-HPP   CORONARY ARTERY BYPASS GRAFT N/A 11/24/2019   Procedure: CORONARY ARTERY BYPASS GRAFTING (CABG) using LIMA to LAD.;  Surgeon: Alleen Borne, MD;  Location: [facility] OR;  Service: Open Heart Surgery;  Laterality: N/A;   ELBOW SURGERY  bilaterally for cubital tunnel   ENDARTERECTOMY N/A 10/03/2020   Procedure: AORTIC ENDARTERECTOMY;  Surgeon: Nada Libman, MD;  Location: [facility];  Service: Vascular;  Laterality: N/A;   ENDARTERECTOMY FEMORAL Bilateral 10/03/2020   Procedure: BILATERAL FEMORAL ENDARTERECTOMY;  Surgeon: Nada Libman, MD;  Location: [facility];  Service: Vascular;  Laterality: Bilateral;   HERNIA REPAIR     with mesh   INCISION AND DRAINAGE Left 01/02/2021   Procedure: INCISION AND DRAINAGE LEFT GROIN WITH STIMULAN BEADS;  Surgeon: Nada Libman, MD;  Location: [facility];  Service: Vascular;  Laterality: Left;   INCISION AND DRAINAGE OF WOUND Left 10/17/2020   Procedure: EXPLORATION LEFT GROIN WOUND;  Surgeon: Nada Libman, MD;  Location: [facility];  Service: Vascular;  Laterality: Left;   MAXIMUM ACCESS (MAS)POSTERIOR LUMBAR INTERBODY FUSION (PLIF) 1 LEVEL N/A  10/25/2014   Procedure: LUMBAR FOUR TO FIVE MAXIMUM ACCESS (MAS) POSTERIOR LUMBAR INTERBODY FUSION (PLIF) 1 LEVEL;  Surgeon: Tia Alert, MD;  Location: [facility];  Service: Neurosurgery;  Laterality: N/A;   RIGHT/LEFT HEART CATH AND CORONARY ANGIOGRAPHY N/A 11/10/2019   Procedure: RIGHT/LEFT HEART CATH AND CORONARY ANGIOGRAPHY;  Surgeon: [place], [name], MD;  Location: [facility] INVASIVE CV LAB;  Service: Cardiovascular;  Laterality: N/A;   SKIN BIOPSY Left 10/12/2018   shave forehead Hypertrophic actinic kertosis with features of a verruca   TEE WITHOUT CARDIOVERSION N/A 11/24/2019   Procedure: TRANSESOPHAGEAL ECHOCARDIOGRAM (TEE);  Surgeon: Alleen Borne, MD;  Location: [facility] OR;  Service: Open Heart Surgery;  Laterality: N/A;   TONSILLECTOMY  age 82   VASECTOMY     X 2   VASECTOMY REVERSAL     Patient Active Problem List   Diagnosis Date Noted   Subarachnoid hemorrhage following injury (HCC) 08/26/2023   Skull fracture with cerebral contusion (HCC) 08/26/2023   Temporal bone fracture (HCC) 08/26/2023   Aortic atherosclerosis (HCC) 05/26/2023   Acute non-recurrent pansinusitis 01/04/2023   Perennial allergic rhinitis 07/09/2022   Asthma-COPD overlap syndrome 04/21/2022   Recurrent infections 04/21/2022   S/P aortobifemoral bypass surgery 04/03/2021   S/P CABG x 1 11/24/2019   S/P aortic valve replacement with bioprosthetic valve 11/24/2019   PAD (peripheral artery disease) (HCC)    Mild persistent asthma 10/29/2019   RBBB (right bundle branch block with left anterior fascicular block)    Peripheral neuropathy    S/P lumbar spinal fusion 10/25/2014   Recovering alcoholic in remission (HCC) 08/10/2014   Substance induced mood disorder (HCC) 06/30/2014   Substance-induced sleep disorder (HCC) 06/30/2014   Obsessive compulsive disorder    OSA (obstructive sleep apnea) 01/20/2014   CAD (coronary artery disease)    Mentally disabled 11/25/2012   Hyperlipidemia with target LDL less than  130 11/25/2012   Bipolar disorder (HCC)    Hypertension    Allergic rhinitis due to pollen 08/11/2011    ONSET DATE: 08/28/2023  REFERRING DIAG: R55 (ICD-10-CM) - Syncope, unspecified syncope type I60.9 (ICD-10-CM) - Subarachnoid hemorrhage (HCC)  THERAPY DIAG:  Unsteadiness on feet  Other abnormalities of gait and mobility  Muscle weakness (generalized)  Dizziness and giddiness  History of falling  Rationale for Evaluation and Treatment: Rehabilitation  SUBJECTIVE:  SUBJECTIVE STATEMENT: HOH on L side due to temporal bone fx  Pt reports he has been working on exercises at home and they are going overall well. Denies falls/near falls.   Pt accompanied by:  Wife, Chales Abrahams   PERTINENT HISTORY: CABG, aortic valve replacement (2021)  PAIN:  Are you having pain? Yes: NPRS scale: 4-5/10 Pain location: Low back  Pain description: In low back, achy. In head, disorientation Aggravating factors: Movement  Relieving factors: Ice   PRECAUTIONS: Fall and Other: no bending forward (skull fx)   RED FLAGS: None   WEIGHT BEARING RESTRICTIONS: No  FALLS: Has patient fallen in last 6 months? Yes. Number of falls 2 (hit head both times)   LIVING ENVIRONMENT: Lives with: lives with their spouse Lives in: House/apartment Stairs: Yes: External: 4 steps; on left going up Has following equipment at home: Dan Humphreys - 2 wheeled and shower chair  PLOF: Independent  PATIENT GOALS: "To reduce my symptoms."  OBJECTIVE:   DIAGNOSTIC FINDINGS:   CT Head wo Contrast 09/08/2023 IMPRESSION: 1. Expected evolution of multicompartmental intracranial hemorrhage since 08/28/2023, with no residual hyperdense blood. 2. 3 mm thick hypodense right parietal convexity subdural collection is unchanged and may  reflect a subdural hygroma or evolving subdural hematoma. 3. Unchanged right temporal/parietal bone fracture.  CT Lumbar Spine wo Contrast 09/08/2023: IMPRESSION: 1. No acute fracture or traumatic malalignment of the lumbar spine. 2. Postsurgical changes reflecting posterior instrumented fusion and decompression at L5-S1 without residual spinal canal or neural foraminal stenosis. 3. Adjacent segment disease at L3-L4 resulting in at least moderate spinal canal stenosis and moderate bilateral neural foraminal stenosis.  CT of head from 08/27/23   IMPRESSION: 1. No significant interval change in bilateral frontal convexity subdural fluid collection measuring up to 6 mm. 2. Redemonstrated subarachnoid hemorrhage and hemorrhagic contusions along the bilateral anterior frontal lobes, slightly increased on the left measuring up 2.1 cm ( previously 1.9 cm), and unchanged along the right frontal lobe. Compared to prior exam there is interval increase in conspicuity of subarachnoid blood products along the bilateral parietal lobes, likely due to redistribution. No evidence of intraventricular extension. No hydrocephalus. 3. Redemonstrated acute nondisplaced fracture involving the right temporal bone.  COGNITION: Overall cognitive status: Impaired   SENSATION: WFL  POSTURE: rounded shoulders and forward head  BED MOBILITY:  Independent per pt  TRANSFERS: Assistive device utilized: Environmental consultant - 2 wheeled  Sit to stand: SBA Stand to sit: SBA   GAIT: Gait pattern: step through pattern, decreased stride length, antalgic, lateral hip instability, decreased trunk rotation, and trunk flexed Distance walked: Various clinic distances  Assistive device utilized: Walker - 2 wheeled Level of assistance: CGA Comments: Pt very rigid and antalgic. No LOB noted but does require min cues to stay inside RW   FUNCTIONAL TESTS:  5x STS and MCTSIB to be assessed   PATIENT SURVEYS:  [place] to be  obtained  TODAY'S TREATMENT:           Vitals:   09/17/23 1412  BP: (!) 125/51  Pulse: (!) 58    NMR:  Brock string make an X looking at single bead (string and head still) 2 x 3 attempts Increased fatigue noted on second attempt VOR x 1 viewing (performed seated and busy background) 1 x 30 seconds performed very slow and with intermittent saccades that reduced with reduction in pace Saccades on busy background with therapist moving card from horizontal to vertical to diagonal ~5x each direction (able to track smooth  ely, no major saccades noted) Standing on foam with CGA-SBA placing playing card alt side on cabinet with head rotation for visual tracking x 20, progressed to reading alt head rotation x 20 cards, reading all black card x 6 and all red cards x 6, before returning all 20 cards back to shelf level  PATIENT EDUCATION: Education details: additions to HEP Person educated: Patient and Spouse Education method: Explanation and Handouts Education comprehension: verbalized understanding and needs further education  HOME EXERCISE PROGRAM:  Access Code: [email or web address] URL: [email or web address] Date: 09/08/2023 Prepared by: Maryruth Eve  Exercises - Corner Balance Feet Together With Eyes Closed  - 1 x daily - 7 x weekly - 3 sets - 30 seconds hold - Pencil Pushups  - 1 x daily - 7 x weekly - 3 sets - 10 reps - Seated Horizontal Saccades  - 1 x daily - 7 x weekly - 3 sets - 10 reps - Seated Vertical Saccades  - 1 x daily - 7 x weekly - 3 sets - 10 reps -[place] eye tracking (1 paragraph)  -King Devick's Saccades 1x a day   Education on SPOT it game and HART chart use, VOR x 1 viewing 3 x 5 rounds Brock string ~3x a day with spouse guiding through   GOALS: Goals reviewed with patient? Yes  SHORT TERM GOALS: Target date: 09/29/2023   Pt will be independent with initial HEP for improved balance, transfers and gait.  Baseline: not established on eval  Goal  status: INITIAL  2.  Patient will improve RPQ-3 score to </= 2 per item to indicate reduced severity of post-concussive symptoms to progress towards PLOF.    Baseline: 3/4 Goal status: INITIAL  3.  5x STS to be assessed and STG/LTG updated  Baseline: 15.65 seconds without UE use (SBA) Goal status: Discontinued STG due to how well performed   4.  Patient will improve mCTSIB score to 100/120 to indicate improved integration of vestibular, proprioceptive, and visual balance systems during static balance tasks in order to reduce risk for falls.   Baseline: 86/120 Goal status: INITIAL   LONG TERM GOALS: Target date: 10/27/2023   Pt will be independent with final HEP for improved balance, transfers and gait.  Baseline:  Goal status: INITIAL  2.  Patient will improve RPQ-13 score to </= 2 per item to indicate reduced severity of post-concussive symptoms to progress towards PLOF.   Baseline: greatest deficit reported 4/4  Goal status: INITIAL  3.  Patient will improve their 5x Sit to Stand score to less than 13 seconds to demonstrate a decreased risk for falls and improved LE strength.   Baseline: 15.65 seconds without UE use (SBA) Goal status: INITIAL  4. Patient will improve mCTSIB score to 120/120 to indicate improved integration of vestibular, proprioceptive, and visual balance systems during static balance tasks in order to reduce risk for falls.   Baseline: 86/120 Goal status: INITIAL   ASSESSMENT:  CLINICAL IMPRESSION: Session emphasized progression of occulomotor/visual scanning/balance exercises. Patient improved saccades noted in today's session but continues to demonstrate intermittent right lateral exotropia with convergence exercises. Able to tolerate first set of [employer] exercises but fatigues quickly. Performed at higher level with balance with large amplitude head turns than more precise occulomotor work. Continue POC.   OBJECTIVE IMPAIRMENTS: Abnormal gait,  decreased activity tolerance, decreased balance, decreased cognition, decreased coordination, decreased endurance, decreased knowledge of condition, decreased knowledge of use of DME, decreased mobility, difficulty walking, decreased strength, decreased safety awareness,  dizziness, impaired perceived functional ability, impaired vision/preception, and pain  ACTIVITY LIMITATIONS: carrying, lifting, bending, sleeping, stairs, transfers, hygiene/grooming, locomotion level, and caring for others  PARTICIPATION LIMITATIONS: meal prep, cleaning, laundry, interpersonal relationship, driving, shopping, community activity, occupation, and yard work  PERSONAL FACTORS: Fitness, Past/current experiences, and 3+ comorbidities: Bradycardia, post-concussion w/LOC,   are also affecting patient's functional outcome.   REHAB POTENTIAL: Good  CLINICAL DECISION MAKING: Unstable/unpredictable  EVALUATION COMPLEXITY: High  PLAN:  PT FREQUENCY: 2x/week  PT DURATION: 8 weeks  PLANNED INTERVENTIONS: Therapeutic exercises, Therapeutic activity, Neuromuscular re-education, Balance training, Gait training, Patient/Family education, Self Care, Joint mobilization, Stair training, Vestibular training, Canalith repositioning, DME instructions, Aquatic Therapy, Dry Needling, Electrical stimulation, Manual therapy, and Re-evaluation  PLAN FOR NEXT SESSION: progress occulomotor/vestibular exercises (VORx1 as tolerated, Brock string), progress corner balance (EC on foam, tandem with head turns as able etc), consider neuropthomology referral pending progress; did OT/speech get scheduled, HART chart with balance work, work on Patent attorney scanning with balance training or dynamic gait/balance with visual scanning   Carmelia Bake, PT, DPT 09/17/2023, 4:05 PM

## 2023-09-21 NOTE — Progress Notes (Signed)
Cardiology Office Note:  .   Date:  09/28/2023  ID:  [name], DOB [birth year], MRN [id number] PCP: Ronnald Nian, MD  [facility] Providers Cardiologist:  Peter Swaziland, MD {   History of Present Illness: .   Gregory [name] is a 72 y.o. male with a past medical history of CAD s/p CABG, aortic valve stenosis s/ AVR, PAD, RBBB, HTN, HLD, tobacco abuse. Patient is followed by Dr. Swaziland and presents today for follow up after a fall.   Patient previously underwent echocardiogram on 10/06/19 that showed EF 55-60%, normal LV size, moderate LVH, normal RV function, severe aortic valve stenosis. Patient underwent R/L heart catheterization on 11/10/19 that showed critical single vessel obstructive CAD with 99% left main, severe aortic stenosis. Patient was referred to CT surgery and underwent CABG x1 on 11/24/19 with LIMA-LAD and AVR. After his surgery, patient had several days of complete heart block before recovering 1:1 conduction. Cardiac monitor after surgery showed NSR with rare PVCs, PACs. Follow up echo 12/13/19 showed EF 70-75%, no LVH, no wall motion abnormalities, normal function of AV prosthesis.   Patient was seen by Dr. Allyson Sabal on 04/10/20 for claudication. Vascular ultrasounds showed bilateral iliac artery occlusions. Follow-up CTA abdomen pelvis showed infrarenal aortic stenosis with right common iliac artery occlusion and occluded left external iliac and common femoral arteries. He was referred to vascular surgery and underwent aortofemorPal bypass grafting.   Patient has been followed by vascular surgery for PAD. Carotid ultrasounds 06/02/22 showed no hemodynamically significant stenosis by duplex criteria. ABIs 11/27/22 indicated mild left lower extremity arterial disease, resting right ABI within normal range.   Patient was last seen by cardiology on 06/04/23. At that time, patient noted that his HR occasionally got into the 40s at rest. He had a bifascicular block on EKG.  He wore a cardiac monitor in 05/2023 that showed normal sinus rhythm with rare PACs and PVC. There was bradycardia during sleeping hours.   Patient was admitted to [facility] from 9/3-9/7 after he had syncope and fall. CT head showed scattered posttraumatic subarachnoid hemorrhages. Also noted to have a nondisplaced fracture of the right temporoparietal calvarium. Neurosurgery was consulted and no acute or urgent interventions were needed. ASA was held and he has been followed by neurosurgery for outpatient follow up.   On interview, patient reports that his HR has been varying a lot recently, and he wonders if his low HR contributed to his syncope and fall. He wears an exercise watch, and his HR is often around 48 BPM. Lately, he has noticed that his HR is occasionally as low as 42 when awake and 39 when asleep. When his HR is in the lower 40s, he feels dizzy, lightheaded, and a bit weak. He also has HR up to the 80s after he exerts himself. Since his fall, he has not been as active as usual. This is because his neurosurgeon cautioned him against being active as a second fall when his skull fracture has not yet healed could cause a lot of damage. He has not had any additional episodes of syncope, but he has had a few instances where he feels dizzy/lightheaded. He is wondering if his history of AV block after surgery, his RBBB, and his low HR are putting him at risk of passing out again.   ROS: Denies chest pain, shortness of breath, palpitations. Does have some dizziness, fatigue   Studies Reviewed: .   Cardiac Studies & Procedures  CARDIAC CATHETERIZATION  CARDIAC CATHETERIZATION 11/10/2019  Narrative  Prox LAD to Mid LAD lesion is 99% stenosed.  Mid Cx to Dist Cx lesion is 50% stenosed.  Mid RCA lesion is 30% stenosed.  The left ventricular systolic function is normal.  LV end diastolic pressure is normal.  The left ventricular ejection fraction is 55-65% by visual estimate.   There is severe aortic valve stenosis.  1. Critical single vessel obstructive CAD with 99% mid LAD - heavily calcified 2. Severe aortic stenosis. Mean gradient 47 mm Hg. AVA 0.68 cm squared with index 0.39. 3. Normal LV filling pressures 4. Normal Right heart pressures 5. Normal cardiac output.  Plan: referral to CT surgery for combined AVR and CABG.  Findings Coronary Findings Diagnostic  Dominance: Right  Left Main Vessel was injected. Vessel is normal in caliber. Vessel is angiographically normal.  Left Anterior Descending Collaterals Mid LAD filled by collaterals from RPDA.  Prox LAD to Mid LAD lesion is 99% stenosed. The lesion is segmental. The lesion is severely calcified.  Left Circumflex Mid Cx to Dist Cx lesion is 50% stenosed.  Right Coronary Artery Mid RCA lesion is 30% stenosed.  Intervention  No interventions have been documented.     ECHOCARDIOGRAM  ECHOCARDIOGRAM COMPLETE 12/13/2019  Narrative ECHOCARDIOGRAM REPORT    Patient Name:   Gregory [name] Date of Exam: 12/13/2019 Medical Rec #:  [id number]          Height:       65.5 in Accession #:    [id number]         Weight:       138.0 lb Date of Birth:  [birth year]          BSA:          1.70 m Patient Age:    68 years           BP:           121/61 mmHg Patient Gender: M                  HR:           55 bpm. Exam Location:  [address]  Procedure: 2D Echo, Cardiac Doppler and Color Doppler  MODIFIED REPORT: This report was modified by Donato Schultz MD on 12/13/2019 due to Aortic valve was bioprosthetic. Indications:     I35.9 Aortic Valve Disorder  History:         Patient has prior history of Echocardiogram examinations, most recent 11/24/2019. Aortic Valve Replacement-24mm Biosprosthetic; Risk Factors:Hypertension, Current Smoker and Dyslipidemia. Aortic Valve: A Coronary artery disease. RBBB. Pneumonia. Sleep apnea. Murmur. Peripheral neuropathy.  Sonographer:     Daphine Deutscher RDCS Referring Phys:  [id number] [name] Sw[place]iland Diagnosing Phys: Donato Schultz MD  IMPRESSIONS   1. Left ventricular ejection fraction, by visual estimation, is 70 to 75%. The left ventricle has normal function. There is no left ventricular hypertrophy. 2. The left ventricle has no regional wall motion abnormalities. 3. Global right ventricle has normal systolic function.The right ventricular size is normal. No increase in right ventricular wall thickness. 4. Left atrial size was normal. 5. Right atrial size was normal. 6. The mitral valve is normal in structure. No evidence of mitral valve regurgitation. No evidence of mitral stenosis. 7. The tricuspid valve is normal in structure. Tricuspid valve regurgitation is trivial. 8. Aortic valve regurgitation is not visualized. 9. Aortic Valve Replacement-110mm Biosprosthetic Peak velocity 2.25m/s, mean . 10. The pulmonic valve  was normal in structure. Pulmonic valve regurgitation is not visualized. 11. Mildly elevated pulmonary artery systolic pressure. 12. The inferior vena cava is normal in size with greater than 50% respiratory variability, suggesting right atrial pressure of 3 mmHg.  FINDINGS Left Ventricle: Left ventricular ejection fraction, by visual estimation, is 70 to 75%. The left ventricle has normal function. The left ventricle has no regional wall motion abnormalities. There is no left ventricular hypertrophy. Normal left atrial pressure.  Right Ventricle: The right ventricular size is normal. No increase in right ventricular wall thickness. Global RV systolic function is has normal systolic function. The tricuspid regurgitant velocity is 2.69 m/s, and with an assumed right atrial pressure of 10 mmHg, the estimated right ventricular systolic pressure is mildly elevated at 39.0 mmHg.  Left Atrium: Left atrial size was normal in size.  Right Atrium: Right atrial size was normal in size  Pericardium: There is no  evidence of pericardial effusion.  Mitral Valve: The mitral valve is normal in structure. No evidence of mitral valve regurgitation. No evidence of mitral valve stenosis by observation.  Tricuspid Valve: The tricuspid valve is normal in structure. Tricuspid valve regurgitation is trivial.  Aortic Valve: The aortic valve has been repaired/replaced. There is moderate thickening and moderate calcification of the aortic valve. Aortic valve regurgitation is not visualized. There is moderate thickening of the aortic valve. There is moderate calcification of the aortic valve. Aortic valve mean gradient measures 10.4 mmHg. Aortic valve peak gradient measures 20.5 mmHg. Aortic Valve Replacement-45mm Biosprosthetic Peak velocity 2.53m/s, mean .  Pulmonic Valve: The pulmonic valve was normal in structure. Pulmonic valve regurgitation is not visualized. Pulmonic regurgitation is not visualized.  Aorta: The aortic root, ascending aorta and aortic arch are all structurally normal, with no evidence of dilitation or obstruction.  Venous: The inferior vena cava is normal in size with greater than 50% respiratory variability, suggesting right atrial pressure of 3 mmHg.  IAS/Shunts: No atrial level shunt detected by color flow Doppler. There is no evidence of a patent foramen ovale. No ventricular septal defect is seen or detected. There is no evidence of an atrial septal defect.   LEFT VENTRICLE PLAX 2D LVIDd:         4.50 cm Diastology LVIDs:         2.40 cm LV e' lateral:   11.10 cm/s LV PW:         1.00 cm LV E/e' lateral: 12.7 LV IVS:        0.90 cm LV e' medial:    6.85 cm/s LV SV:         72 ml   LV E/e' medial:  20.6 LV SV Index:   42.41   RIGHT VENTRICLE RV Basal diam:  3.90 cm RV S prime:     10.90 cm/s TAPSE (M-mode): 1.9 cm  LEFT ATRIUM             Index       RIGHT ATRIUM           Index LA diam:        4.00 cm 2.35 cm/m  RA Area:     14.60 cm LA Vol (A2C):   56.2 ml 33.08 ml/m  RA Volume:   38.30 ml  22.54 ml/m LA Vol (A4C):   45.7 ml 26.90 ml/m LA Biplane Vol: 51.9 ml 30.54 ml/m AORTIC VALVE AV Vmax:           226.60 cm/s AV Vmean:  148.200 cm/s AV VTI:            0.404 m AV Peak Grad:      20.5 mmHg AV Mean Grad:      10.4 mmHg LVOT Vmax:         124.00 cm/s LVOT Vmean:        82.100 cm/s LVOT VTI:          0.248 m LVOT/AV VTI ratio: 0.61  AORTA Ao Root diam: 3.30 cm  MITRAL VALVE                         TRICUSPID VALVE MV Area (PHT): 2.91 cm              TR Peak grad:   29.0 mmHg MV PHT:        75.69 msec            TR Vmax:        284.00 cm/s MV Decel Time: 261 msec MV E velocity: 141.00 cm/s 103 cm/s  SHUNTS MV A velocity: 119.00 cm/s 70.3 cm/s Systemic VTI: 0.25 m MV E/A ratio:  1.18        1.5   Donato Schultz MD Electronically signed by Donato Schultz MD Signature Date/Time: 12/13/2019/1:31:34 PM    Final (Updated)   TEE  ECHO INTRAOPERATIVE TEE 11/24/2019  Narrative *INTRAOPERATIVE TRANSESOPHAGEAL REPORT *    Patient Name:   TRAVER MECKES Date of Exam: 11/24/2019 Medical Rec #:  [id number]          Height:       65.5 in Accession #:    [id number]         Weight:       146.6 lb Date of Birth:  Jul 26, 1951          BSA:          1.74 m Patient Age:    68 years           BP:           188/53 mmHg Patient Gender: M                  HR:           59 bpm. Exam Location:  Anesthesiology  Transesophogeal exam was perform intraoperatively during surgical procedure. Patient was closely monitored under general anesthesia during the entirety of examination.  Indications:     Aortic valve disease Sonographer:     Tonia Ghent RDCS Performing Phys: Evelene Croon MD Diagnosing Phys: Achille Rich MD  Complications: No known complications during this procedure. POST-OP IMPRESSIONS - Left Ventricle: The left ventricle is unchanged from pre-bypass. - Aorta: The aorta appears unchanged from pre-bypass. - Left Atrial Appendage:  The left atrial appendage appears unchanged from pre-bypass. - Aortic Valve: A pericardial bioprosthetic valve was placed, leaflets thin and freely mobile. Manufactured by; Randa Evens Size; 23mm. No regurgitation post repair. The gradient recorded across the prosthetic valve is within the expected range, measuring 223 cm/s. No perivalvular leak noted. - Mitral Valve: The mitral valve appears unchanged from pre-bypass. - Tricuspid Valve: The tricuspid valve appears unchanged from pre-bypass. - Interatrial Septum: The interatrial septum appears unchanged from pre-bypass. - Pericardium: The pericardium appears unchanged from pre-bypass.  PRE-OP FINDINGS Left Ventricle: The left ventricle has normal systolic function, with an ejection fraction of 55-60%. The cavity size was normal. There is severely increased left ventricular wall thickness. No evidence of left  ventricular regional wall motion abnormalities. Stage I diastolic dysfunction. E/A <1. Deceleration time 440 ms.  Right Ventricle: The right ventricle has normal systolic function. The cavity was normal. There is no increase in right ventricular wall thickness.  Left Atrium: Left atrial size was normal in size.  Right Atrium: Right atrial size was normal in size. Right atrial pressure is estimated at 10 mmHg.  Interatrial Septum: No atrial level shunt detected by color flow Doppler.  Pericardium: There is no evidence of pericardial effusion.  Mitral Valve: The mitral valve is normal in structure. Mitral valve regurgitation is trivial by color flow Doppler.  Tricuspid Valve: The tricuspid valve was normal in structure. Tricuspid valve regurgitation is trivial by color flow Doppler.  Aortic Valve: The aortic valve is tricuspid There is severe thickening of the aortic valve and There is severe calcifcation of the aortic valve Aortic valve regurgitation is mild by color flow Doppler. There is severe stenosis of the aortic valve, with  a calculated valve area of 0.55 cm. A 23mm Edwards pericardial aortic valve bioprosthesis valve is present in the aortic position. Normal aortic valve prosthesis.  Pulmonic Valve: The pulmonic valve was normal in structure. Pulmonic valve regurgitation is trivial by color flow Doppler.   Aorta: The aortic root, ascending aorta and aortic arch are normal in size and structure. There is evidence of atheroma immobile plaque in the descending aorta; Grade II, measuring 2-5mm in size.  +--------------+--------++ LEFT VENTRICLE         +--------------+--------++ PLAX 2D                +--------------+--------++ +------------+------------++ LVOT diam:    1.80 cm  3D Volume EF             +--------------+--------++ +------------+------------++ LVOT Area:    2.54 cm LV 3D EF:   68.00 %      +--------------+--------++ +------------+------------++                        LV 3D EDV:  72400.00 mm +--------------+--------++ +------------+------------++ LV 3D ESV:  23100.00 mm +------------+------------++ LV 3D SV:   49200.00 mm +------------+------------++  +------------------+------------++ AORTIC VALVE                   +------------------+------------++ AV Area (Vmax):   0.53 cm     +------------------+------------++ AV Area (Vmean):  0.53 cm     +------------------+------------++ AV Area (VTI):    0.55 cm     +------------------+------------++ AV Vmax:          431.00 cm/s  +------------------+------------++ AV Vmean:         289.000 cm/s +------------------+------------++ AV VTI:           1.350 m      +------------------+------------++ AV Peak Grad:     74.3 mmHg    +------------------+------------++ AV Mean Grad:     39.0 mmHg    +------------------+------------++ LVOT Vmax:        90.10 cm/s   +------------------+------------++ LVOT Vmean:       59.700 cm/s   +------------------+------------++ LVOT VTI:         0.294 m      +------------------+------------++ LVOT/AV VTI ratio:0.22         +------------------+------------++  +--------------+-----------++ MITRAL VALVE              +--------------+-------+ +--------------+-----------++ SHUNTS                MV Area (PHT):1.71 cm    +--------------+-------+ +--------------+-----------++  Systemic VTI: 0.29 m  MV PHT:       128.76 msec +--------------+-------+ +--------------+-----------++ Systemic Diam:1.80 cm MV Decel Time:444 msec    +--------------+-------+ +--------------+-----------++ +--------------+----------++ MV E velocity:67.90 cm/s +--------------+----------++ MV A velocity:79.10 cm/s +--------------+----------++ MV E/A ratio: 0.86       +--------------+----------++   Achille Rich MD Electronically signed by Achille Rich MD Signature Date/Time: 11/25/2019/7:40:51 PM    Final   MONITORS  LONG TERM MONITOR (3-14 DAYS) 06/30/2023  Narrative   Normal sinus rhythm   Rare PACs and PVCs   Patieint triggered events not associated with arrhythmia   Patch Wear Time:  13 days and 23 hours (2024-06-18T12:44:28-0400 to 2024-07-02T12:44:24-0400)  Patient had a min HR of 39 bpm, max HR of 113 bpm, and avg HR of 59 bpm. Predominant underlying rhythm was Sinus Rhythm. Bundle Branch Block/IVCD was present. Isolated SVEs were rare (<1.0%), SVE Couplets were rare (<1.0%), and no SVE Triplets were present. Isolated VEs were rare (<1.0%), VE Couplets were rare (<1.0%), and no VE Triplets were present. Ventricular Trigeminy was present.   CT SCANS  CT CORONARY MORPH W/CTA COR W/SCORE 11/18/2019  Addendum 11/18/2019  3:38 PM ADDENDUM REPORT: 11/18/2019 15:35  ADDENDUM: Extracardiac findings are described separately under dictation for contemporaneously obtained CTA chest, abdomen and pelvis dated 11/18/2019. Please see that dictation  for full description.   Electronically Signed By: Trudie Reed M.D. On: 11/18/2019 15:35  Narrative CLINICAL DATA:  Aortic Stenosis  EXAM: Cardiac TAVR CT  TECHNIQUE: The patient was scanned on a Siemens Force 192 slice scanner. A 120 kV retrospective scan was triggered in the ascending thoracic aorta at 140 HU's. Gantry rotation speed was 250 msecs and collimation was .6 mm. No beta blockade or nitro were given. The 3D data set was reconstructed in 5% intervals of the R-R cycle. Systolic and diastolic phases were analyzed on a dedicated work station using MPR, MIP and VRT modes. The patient received 80 cc of contrast.  FINDINGS: Aortic Valve: Tri leaflet severely calcified with restricted leaflet motion  Aorta: No aneurysm Moderate calcific atherosclerosis normal arch vessels  Sino-tubular Junction: 26 mm  Ascending Thoracic Aorta: 31 mm  Aortic Arch: 31 mm  Descending Thoracic Aorta: 23 mm  Sinus of Valsalva Measurements:  Non-coronary: 31.4 mm  Right - coronary: 29.8 mm  Left -   coronary: 33.6 mm  Coronary Artery Height above Annulus:  Left Main: 11.5 mm above annulus  Right Coronary: 14.2 mm above annulus  Virtual Basal Annulus Measurements:  Maximum / Minimum Diameter: 22.3 mm x 27.2 mm  Perimeter: 80 mm  Area: 501 mm2  Coronary Arteries: Sufficient height above annulus for deployment  Optimum Fluoroscopic Angle for Delivery: LAO 14 Caudal 14 degrees  IMPRESSION: 1. Tri leaflet AV with annular area of 501 mm2 suitable for a 26 mm Sapien 3 Ultra valve  2. Optimum angiographic angle for deployment LAO 14 Caudal 14 degrees  3.  Normal aortic root 3.1 cm  4.  Coronary arteries sufficient height above annulus for deployment  Charlton Haws  Electronically Signed: By: Charlton Haws M.D. On: 11/18/2019 13:38          Risk Assessment/Calculations:             Physical Exam:   VS:  BP (!) 118/58   Pulse (!) 47   Ht 5\' 5"  (1.651  m)   Wt 136 lb 12.8 oz (62.1 kg)   SpO2 98%   BMI 22.76 kg/m  Wt Readings from Last 3 Encounters:  09/28/23 136 lb 12.8 oz (62.1 kg)  09/02/23 128 lb 3.2 oz (58.2 kg)  08/29/23 138 lb 14.4 oz (63 kg)    GEN: Well nourished, well developed in no acute distress. Sitting comfortably in the chair  NECK: No JVD; No carotid bruits CARDIAC: Regular rhythm, bradycardic. no murmurs, rubs, gallops RESPIRATORY:  Clear to auscultation without rales, wheezing or rhonchi. Normal work of breathing on room air  ABDOMEN: Soft, non-tender, non-distended EXTREMITIES:  No edema in BLE; No deformity   ASSESSMENT AND PLAN: .    Bradycardia  RBBB Syncope  - After his CABG and AVR in 2020, patient had several days of complete heart block. Recovered 1:1 conduction and did not end up needing PPM at that time - Recent cardiac monitor from 05/2022 showed NSR with bradycardia during sleeping hours  - Patient has a chronic RBBB that has been present since at least 2011 (oldest EKG in system)  - Patient had a syncopal episode on 9/3 that resulted in a skull fracture and scattered posttraumatic hemorrhages - Notes that his HR is often in the 40s at home. Does feel dizzy/lightheaded and weak when HR is low. Not on AV nodal medicaitons  - Ordered 2 week live zio  -  Referred to EP for consideration of loop recorder, possible PPM  - Ordered TSH, BMP, mag  - Advised patient not to drive for at least 6 months following his syncopal episode. Patient voiced understanding  - Ordered echocardiogram   CAD s/p CABG - Underwent Cabg with LIMA-LAD in 11/2019  - Patient denies chest pain  - Continue amlodipine 10 mg daily, lipitor 80 mg daily. ASA currently held due to recent skull fracture and posttraumatic hemorrhages   Aortic Stenosis s/p AVR  - Patient underwent AVR at the time of CABG in 11/2019 - Most recent echo from 11/2019 showed normal function of prosthesis  - Ordered echo for monitoring   HTN  - BP well  controlled  - Continue amlodipine 10 mg daily, lisinopril-hydrochlorothiazide 20-12.5 mg daily  - BMP ordered as above   HLD  - Lipid panel from 03/2023 showed LDL 50  - Continue lipitor 80 mg daily   PAD  - Followed by vascular surgery  - Underwent aortobifemoral bypass graft in 09/2020  - ABIs in 11/2022 showed mild arterial disease in left, normal right  - Continue lipitor 80 mg daily      Dispo: Follow up in 6 weeks. Referral to EP   Signed, Jonita Albee, PA-C

## 2023-09-22 ENCOUNTER — Ambulatory Visit: Payer: PPO | Attending: Family Medicine | Admitting: Physical Therapy

## 2023-09-22 VITALS — BP 163/67 | HR 49

## 2023-09-22 DIAGNOSIS — M6281 Muscle weakness (generalized): Secondary | ICD-10-CM | POA: Insufficient documentation

## 2023-09-22 DIAGNOSIS — R41841 Cognitive communication deficit: Secondary | ICD-10-CM | POA: Insufficient documentation

## 2023-09-22 DIAGNOSIS — I69018 Other symptoms and signs involving cognitive functions following nontraumatic subarachnoid hemorrhage: Secondary | ICD-10-CM | POA: Diagnosis not present

## 2023-09-22 DIAGNOSIS — Z9181 History of falling: Secondary | ICD-10-CM | POA: Diagnosis not present

## 2023-09-22 DIAGNOSIS — R42 Dizziness and giddiness: Secondary | ICD-10-CM | POA: Diagnosis not present

## 2023-09-22 DIAGNOSIS — R2689 Other abnormalities of gait and mobility: Secondary | ICD-10-CM | POA: Insufficient documentation

## 2023-09-22 DIAGNOSIS — R41842 Visuospatial deficit: Secondary | ICD-10-CM | POA: Insufficient documentation

## 2023-09-22 DIAGNOSIS — R41844 Frontal lobe and executive function deficit: Secondary | ICD-10-CM | POA: Diagnosis not present

## 2023-09-22 DIAGNOSIS — R2681 Unsteadiness on feet: Secondary | ICD-10-CM | POA: Insufficient documentation

## 2023-09-22 NOTE — Therapy (Signed)
OUTPATIENT PHYSICAL THERAPY NEURO TREATMENT   Patient Name: SEBASTHIAN STAILEY MRN: [id number] DOB:[birth year], 72 y.o., male Today's Date: 09/22/2023   PCP: Ronnald Nian, MD REFERRING PROVIDER: Willeen Niece, MD  END OF SESSION:  PT End of Session - 09/22/23 1024     Visit Number 6    Number of Visits 17    Date for PT Re-Evaluation 11/10/23    Authorization Type [employer]    PT Start Time 1021    PT Stop Time 1106    PT Time Calculation (min) 45 min    Equipment Utilized During Treatment Gait belt    Activity Tolerance Patient tolerated treatment well    Behavior During Therapy WFL for tasks assessed/performed              Past Medical History:  Diagnosis Date   Alcohol abuse    Aortic stenosis    Arthritis    Asthma    exercise indused or pollen-uses inhaler dail yand has rescue inhaler if needed   Bipolar disorder (HCC)    CAD (coronary artery disease)    coronary calcifications 08/2013 CTA   Carotid artery occlusion    Cataract    bilateral sx   Chronic back pain    Claudication (HCC)    Complication of anesthesia 10/16/2020   "had a panic attack when the mask was put on me"" I do not think I was given anything  before, I need something to calm me down"   Depression    Family history of skin cancer    Heart murmur    as an infant   Hepatitis    h/o 1970,doesn't remember which type,1990 epstain-barr   Herpes zoster without complication 09/05/2019   Hyperlipidemia    Hypertension    Neuromuscular disorder (HCC)    Obsessive compulsive disorder    PAD (peripheral artery disease) (HCC)    Peripheral neuropathy    Pneumonia    RBBB    RBBB (right bundle branch block with left anterior fascicular block)    Seasonal allergies    Severe aortic stenosis    Sleep apnea    does not wear CPAP   Smoker    Spinal stenosis at L4-L5 level    Tobacco abuse    Tobacco use disorder    Past Surgical History:  Procedure Laterality Date   ANAL  FISTULECTOMY  09/25/2011   AORTA - BILATERAL FEMORAL ARTERY BYPASS GRAFT N/A 10/03/2020   Procedure: AORTA BIFEMORAL BYPASS GRAFT USING A HEMASHIELD GOLD BIFURCATED 14 X 7mm GRAFT AND INFERIOR MESENTERIC ARTERY REIMPLANTATION;  Surgeon: Nada Libman, MD;  Location: [facility] OR;  Service: Vascular;  Laterality: N/A;   AORTIC VALVE REPLACEMENT N/A 11/24/2019   Procedure: AORTIC VALVE REPLACEMENT (AVR) using INSPIRIS Resilia 23 MM Bioprosthetic Aortic Valve.;  Surgeon: Alleen Borne, MD;  Location: [facility] OR;  Service: Open Heart Surgery;  Laterality: N/A;   APPLICATION OF WOUND VAC Left 10/17/2020   Procedure: APPLICATION OF WOUND VAC;  Surgeon: Nada Libman, MD;  Location: [facility];  Service: Vascular;  Laterality: Left;   BACK SURGERY     2015   CATARACT EXTRACTION, BILATERAL  2022   COLONOSCOPY  2009   MS-F/V-mov(exc)-HPP   CORONARY ARTERY BYPASS GRAFT N/A 11/24/2019   Procedure: CORONARY ARTERY BYPASS GRAFTING (CABG) using LIMA to LAD.;  Surgeon: Alleen Borne, MD;  Location: [facility];  Service: Open Heart Surgery;  Laterality: N/A;   ELBOW SURGERY  bilaterally for cubital tunnel   ENDARTERECTOMY N/A 10/03/2020   Procedure: AORTIC ENDARTERECTOMY;  Surgeon: Nada Libman, MD;  Location: [facility];  Service: Vascular;  Laterality: N/A;   ENDARTERECTOMY FEMORAL Bilateral 10/03/2020   Procedure: BILATERAL FEMORAL ENDARTERECTOMY;  Surgeon: Nada Libman, MD;  Location: [facility];  Service: Vascular;  Laterality: Bilateral;   HERNIA REPAIR     with mesh   INCISION AND DRAINAGE Left 01/02/2021   Procedure: INCISION AND DRAINAGE LEFT GROIN WITH STIMULAN BEADS;  Surgeon: Nada Libman, MD;  Location: [facility];  Service: Vascular;  Laterality: Left;   INCISION AND DRAINAGE OF WOUND Left 10/17/2020   Procedure: EXPLORATION LEFT GROIN WOUND;  Surgeon: Nada Libman, MD;  Location: [facility];  Service: Vascular;  Laterality: Left;   MAXIMUM ACCESS (MAS)POSTERIOR LUMBAR INTERBODY FUSION (PLIF) 1 LEVEL N/A  10/25/2014   Procedure: LUMBAR FOUR TO FIVE MAXIMUM ACCESS (MAS) POSTERIOR LUMBAR INTERBODY FUSION (PLIF) 1 LEVEL;  Surgeon: Tia Alert, MD;  Location: [facility];  Service: Neurosurgery;  Laterality: N/A;   RIGHT/LEFT HEART CATH AND CORONARY ANGIOGRAPHY N/A 11/10/2019   Procedure: RIGHT/LEFT HEART CATH AND CORONARY ANGIOGRAPHY;  Surgeon: [place]az[place], [name], MD;  Location: [facility] INVASIVE CV LAB;  Service: Cardiovascular;  Laterality: N/A;   SKIN BIOPSY Left 10/12/2018   shave forehead Hypertrophic actinic kertosis with features of a verruca   TEE WITHOUT CARDIOVERSION N/A 11/24/2019   Procedure: TRANSESOPHAGEAL ECHOCARDIOGRAM (TEE);  Surgeon: Alleen Borne, MD;  Location: [facility] OR;  Service: Open Heart Surgery;  Laterality: N/A;   TONSILLECTOMY  age 45   VASECTOMY     X 2   VASECTOMY REVERSAL     Patient Active Problem List   Diagnosis Date Noted   Subarachnoid hemorrhage following injury (HCC) 08/26/2023   Skull fracture with cerebral contusion (HCC) 08/26/2023   Temporal bone fracture (HCC) 08/26/2023   Aortic atherosclerosis (HCC) 05/26/2023   Acute non-recurrent pansinusitis 01/04/2023   Perennial allergic rhinitis 07/09/2022   Asthma-COPD overlap syndrome (HCC) 04/21/2022   Recurrent infections 04/21/2022   S/P aortobifemoral bypass surgery 04/03/2021   S/P CABG x 1 11/24/2019   S/P aortic valve replacement with bioprosthetic valve 11/24/2019   PAD (peripheral artery disease) (HCC)    Mild persistent asthma 10/29/2019   RBBB (right bundle branch block with left anterior fascicular block)    Peripheral neuropathy    S/P lumbar spinal fusion 10/25/2014   Recovering alcoholic in remission (HCC) 08/10/2014   Substance induced mood disorder (HCC) 06/30/2014   Substance-induced sleep disorder (HCC) 06/30/2014   Obsessive compulsive disorder    OSA (obstructive sleep apnea) 01/20/2014   CAD (coronary artery disease)    Mentally disabled 11/25/2012   Hyperlipidemia with target LDL  less than 130 11/25/2012   Bipolar disorder (HCC)    Hypertension    Allergic rhinitis due to pollen 08/11/2011    ONSET DATE: 08/28/2023  REFERRING DIAG: R55 (ICD-10-CM) - Syncope, unspecified syncope type I60.9 (ICD-10-CM) - Subarachnoid hemorrhage (HCC)  THERAPY DIAG:  Unsteadiness on feet  Other abnormalities of gait and mobility  Muscle weakness (generalized)  Rationale for Evaluation and Treatment: Rehabilitation  SUBJECTIVE:  SUBJECTIVE STATEMENT: HOH on L side due to temporal bone fx  Pt reports he has been working on exercises at home and they are sightly frustrating but progressing slowly. Set up an appointment with the cardiologist for next week because his HR dropped to 42 bpm while sitting yesterday and jumped around from 42-56 bpm. No falls.   Pt accompanied by:  Wife, Chales Abrahams   PERTINENT HISTORY: CABG, aortic valve replacement (2021)  PAIN:  Are you having pain? Yes: NPRS scale: 3-4/10 Pain location: Low back  Pain description: In low back, achy. In head, disorientation Aggravating factors: Movement  Relieving factors: Ice   PRECAUTIONS: Fall and Other: no bending forward (skull fx)   RED FLAGS: None   WEIGHT BEARING RESTRICTIONS: No  FALLS: Has patient fallen in last 6 months? Yes. Number of falls 2 (hit head both times)   LIVING ENVIRONMENT: Lives with: lives with their spouse Lives in: House/apartment Stairs: Yes: External: 4 steps; on left going up Has following equipment at home: Dan Humphreys - 2 wheeled and shower chair  PLOF: Independent  PATIENT GOALS: "To reduce my symptoms."  OBJECTIVE:   DIAGNOSTIC FINDINGS:   CT Head wo Contrast 09/08/2023 IMPRESSION: 1. Expected evolution of multicompartmental intracranial hemorrhage since 08/28/2023, with no  residual hyperdense blood. 2. 3 mm thick hypodense right parietal convexity subdural collection is unchanged and may reflect a subdural hygroma or evolving subdural hematoma. 3. Unchanged right temporal/parietal bone fracture.  CT Lumbar Spine wo Contrast 09/08/2023: IMPRESSION: 1. No acute fracture or traumatic malalignment of the lumbar spine. 2. Postsurgical changes reflecting posterior instrumented fusion and decompression at L5-S1 without residual spinal canal or neural foraminal stenosis. 3. Adjacent segment disease at L3-L4 resulting in at least moderate spinal canal stenosis and moderate bilateral neural foraminal stenosis.  CT of head from 08/27/23   IMPRESSION: 1. No significant interval change in bilateral frontal convexity subdural fluid collection measuring up to 6 mm. 2. Redemonstrated subarachnoid hemorrhage and hemorrhagic contusions along the bilateral anterior frontal lobes, slightly increased on the left measuring up 2.1 cm ( previously 1.9 cm), and unchanged along the right frontal lobe. Compared to prior exam there is interval increase in conspicuity of subarachnoid blood products along the bilateral parietal lobes, likely due to redistribution. No evidence of intraventricular extension. No hydrocephalus. 3. Redemonstrated acute nondisplaced fracture involving the right temporal bone.  COGNITION: Overall cognitive status: Impaired   SENSATION: WFL  POSTURE: rounded shoulders and forward head  BED MOBILITY:  Independent per pt  TRANSFERS: Assistive device utilized: Environmental consultant - 2 wheeled  Sit to stand: SBA Stand to sit: SBA   GAIT: Gait pattern: step through pattern, decreased stride length, antalgic, lateral hip instability, decreased trunk rotation, and trunk flexed Distance walked: Various clinic distances  Assistive device utilized: Walker - 2 wheeled Level of assistance: CGA Comments: Pt very rigid and antalgic. No LOB noted but does require min  cues to stay inside RW   FUNCTIONAL TESTS:  5x STS and MCTSIB to be assessed   PATIENT SURVEYS:  [place] to be obtained  VITALS  Vitals:   09/22/23 1041  BP: (!) 163/67  Pulse: (!) 49     TODAY'S TREATMENT:          Ther Act  Assessed vitals (see above) and BP elevated but pt reports he has not taken his BP meds yet today. Monitored HR closely throughout session and pt's HR ranging from 42-59 bpm at rest. Pt asymptomatic  Discussed importance of pt  completing SLP evaluation due to impaired selective and sustained attention. Pt verbalized understanding.   Pt requesting copies of color Edwyna Shell chart, which was provided.   NMR  In // bars for improved reactive/anticipatory balance strategies, visual tracking and BLE strength:  Fwd/retro gait w/no UE support, x3 each direction. No LOB noted w/movement but did note moderate A/P sway when static standing  On rockerboard in A/P direction, standing w/o UE support and using mirror for visual biofeedback on body position x5 minutes. Pt assumes posterior lean w/delayed righting reactions when losing balance posteriorly and premature reactions when losing balance anteriorly.  On rockerboard, Ball tosses w/wife, x5 minutes w/Min A for anterior LOB correction. Pt assumed forward flexed position w/activity to compensate for posterior lean preference and had absent righting reactions when losing balance due to focusing on catching ball.    PATIENT EDUCATION: Education details: continue HEP, where to get pulse ox Person educated: Patient and Spouse Education method: Explanation and Handouts Education comprehension: verbalized understanding and needs further education  HOME EXERCISE PROGRAM:  Access Code: [email or web address] URL: [email or web address] Date: 09/08/2023 Prepared by: Maryruth Eve  Exercises - Corner Balance Feet Together With Eyes Closed  - 1 x daily - 7 x weekly - 3 sets - 30 seconds hold - Pencil Pushups  - 1 x daily - 7 x  weekly - 3 sets - 10 reps - Seated Horizontal Saccades  - 1 x daily - 7 x weekly - 3 sets - 10 reps - Seated Vertical Saccades  - 1 x daily - 7 x weekly - 3 sets - 10 reps -[place] eye tracking (1 paragraph)  -King Devick's Saccades 1x a day   Education on SPOT it game and HART chart use, VOR x 1 viewing 3 x 5 rounds Brock string ~3x a day with spouse guiding through   GOALS: Goals reviewed with patient? Yes  SHORT TERM GOALS: Target date: 09/29/2023   Pt will be independent with initial HEP for improved balance, transfers and gait.  Baseline: not established on eval  Goal status: INITIAL  2.  Patient will improve RPQ-3 score to </= 2 per item to indicate reduced severity of post-concussive symptoms to progress towards PLOF.    Baseline: 3/4 Goal status: INITIAL  3.  5x STS to be assessed and STG/LTG updated  Baseline: 15.65 seconds without UE use (SBA) Goal status: Discontinued STG due to how well performed   4.  Patient will improve mCTSIB score to 100/120 to indicate improved integration of vestibular, proprioceptive, and visual balance systems during static balance tasks in order to reduce risk for falls.   Baseline: 86/120 Goal status: INITIAL   LONG TERM GOALS: Target date: 10/27/2023   Pt will be independent with final HEP for improved balance, transfers and gait.  Baseline:  Goal status: INITIAL  2.  Patient will improve RPQ-13 score to </= 2 per item to indicate reduced severity of post-concussive symptoms to progress towards PLOF.   Baseline: greatest deficit reported 4/4  Goal status: INITIAL  3.  Patient will improve their 5x Sit to Stand score to less than 13 seconds to demonstrate a decreased risk for falls and improved LE strength.   Baseline: 15.65 seconds without UE use (SBA) Goal status: INITIAL  4. Patient will improve mCTSIB score to 120/120 to indicate improved integration of vestibular, proprioceptive, and visual balance systems during static  balance tasks in order to reduce risk for falls.   Baseline: 86/120 Goal status: INITIAL   ASSESSMENT:  CLINICAL IMPRESSION: Emphasis of skilled PT session on discussing medical changes and progression in PT and anticipatory/reactive balance strategies. Pt reports he is going to see his cardiologist next week due to concerns w/fluctuations in his HR. Pt reports he is working diligently on his HEP but is frustrated w/slow progress. Pt continues to demonstrate poor selective and sustained attention as well as poor insight into safety awareness and deficits. Pt demonstrates delayed righting reactions w/posterior LOB and premature reaction w/anterior LOB, so will benefit from skilled PT to address postural control and dynamic balance. Continue POC.   OBJECTIVE IMPAIRMENTS: Abnormal gait, decreased activity tolerance, decreased balance, decreased cognition, decreased coordination, decreased endurance, decreased knowledge of condition, decreased knowledge of use of DME, decreased mobility, difficulty walking, decreased strength, decreased safety awareness, dizziness, impaired perceived functional ability, impaired vision/preception, and pain  ACTIVITY LIMITATIONS: carrying, lifting, bending, sleeping, stairs, transfers, hygiene/grooming, locomotion level, and caring for others  PARTICIPATION LIMITATIONS: meal prep, cleaning, laundry, interpersonal relationship, driving, shopping, community activity, occupation, and yard work  PERSONAL FACTORS: Fitness, Past/current experiences, and 3+ comorbidities: Bradycardia, post-concussion w/LOC,   are also affecting patient's functional outcome.   REHAB POTENTIAL: Good  CLINICAL DECISION MAKING: Unstable/unpredictable  EVALUATION COMPLEXITY: High  PLAN:  PT FREQUENCY: 2x/week  PT DURATION: 8 weeks  PLANNED INTERVENTIONS: Therapeutic exercises, Therapeutic activity, Neuromuscular re-education, Balance training, Gait training, Patient/Family education,  Self Care, Joint mobilization, Stair training, Vestibular training, Canalith repositioning, DME instructions, Aquatic Therapy, Dry Needling, Electrical stimulation, Manual therapy, and Re-evaluation  PLAN FOR NEXT SESSION: progress occulomotor/vestibular exercises (VORx1 as tolerated, Brock string), progress corner balance (EC on foam, tandem with head turns as able etc), consider neuropthomology referral pending progress; did OT/speech get scheduled, HART chart with balance work, work on Patent attorney scanning with balance training or dynamic gait/balance with visual scanning, midline orientation   Destyn Parfitt E Rhen Dossantos, PT, DPT 09/22/2023, 12:06 PM

## 2023-09-24 ENCOUNTER — Other Ambulatory Visit: Payer: Self-pay | Admitting: Allergy

## 2023-09-24 ENCOUNTER — Telehealth: Payer: Self-pay | Admitting: Family Medicine

## 2023-09-24 ENCOUNTER — Ambulatory Visit: Payer: PPO | Admitting: Physical Therapy

## 2023-09-24 ENCOUNTER — Encounter: Payer: Self-pay | Admitting: Physical Therapy

## 2023-09-24 VITALS — BP 149/56 | HR 49

## 2023-09-24 DIAGNOSIS — R2681 Unsteadiness on feet: Secondary | ICD-10-CM

## 2023-09-24 DIAGNOSIS — Z9181 History of falling: Secondary | ICD-10-CM

## 2023-09-24 DIAGNOSIS — R2689 Other abnormalities of gait and mobility: Secondary | ICD-10-CM

## 2023-09-24 DIAGNOSIS — J3089 Other allergic rhinitis: Secondary | ICD-10-CM

## 2023-09-24 DIAGNOSIS — R42 Dizziness and giddiness: Secondary | ICD-10-CM

## 2023-09-24 DIAGNOSIS — M6281 Muscle weakness (generalized): Secondary | ICD-10-CM

## 2023-09-24 NOTE — Telephone Encounter (Signed)
Spoke with pharmacy. Filled 05/26/23. Still has refills.

## 2023-09-24 NOTE — Telephone Encounter (Signed)
Fax refill request  A[facility] 0.1 %  Last filled 03/09/23

## 2023-09-24 NOTE — Therapy (Signed)
OUTPATIENT PHYSICAL THERAPY NEURO TREATMENT   Patient Name: MATTSON DAYAL MRN: [id number] DOB:Nov 14, 1951, 72 y.o., male Today's Date: 09/24/2023   PCP: Ronnald Nian, MD REFERRING PROVIDER: Willeen Niece, MD  END OF SESSION:  PT End of Session - 09/24/23 1023     Visit Number 7    Number of Visits 17    Date for PT Re-Evaluation 11/10/23    Authorization Type [employer]    PT Start Time 1017    PT Stop Time 1103    PT Time Calculation (min) 46 min    Equipment Utilized During Treatment Gait belt    Activity Tolerance Patient tolerated treatment well    Behavior During Therapy WFL for tasks assessed/performed            Past Medical History:  Diagnosis Date   Alcohol abuse    Aortic stenosis    Arthritis    Asthma    exercise indused or pollen-uses inhaler dail yand has rescue inhaler if needed   Bipolar disorder (HCC)    CAD (coronary artery disease)    coronary calcifications 08/2013 CTA   Carotid artery occlusion    Cataract    bilateral sx   Chronic back pain    Claudication (HCC)    Complication of anesthesia 10/16/2020   "had a panic attack when the mask was put on me"" I do not think I was given anything  before, I need something to calm me down"   Depression    Family history of skin cancer    Heart murmur    as an infant   Hepatitis    h/o 1970,doesn't remember which type,1990 epstain-barr   Herpes zoster without complication 09/05/2019   Hyperlipidemia    Hypertension    Neuromuscular disorder (HCC)    Obsessive compulsive disorder    PAD (peripheral artery disease) (HCC)    Peripheral neuropathy    Pneumonia    RBBB    RBBB (right bundle branch block with left anterior fascicular block)    Seasonal allergies    Severe aortic stenosis    Sleep apnea    does not wear CPAP   Smoker    Spinal stenosis at L4-L5 level    Tobacco abuse    Tobacco use disorder    Past Surgical History:  Procedure Laterality Date   ANAL  FISTULECTOMY  09/25/2011   AORTA - BILATERAL FEMORAL ARTERY BYPASS GRAFT N/A 10/03/2020   Procedure: AORTA BIFEMORAL BYPASS GRAFT USING A HEMASHIELD GOLD BIFURCATED 14 X 7mm GRAFT AND INFERIOR MESENTERIC ARTERY REIMPLANTATION;  Surgeon: Nada Libman, MD;  Location: [facility] OR;  Service: Vascular;  Laterality: N/A;   AORTIC VALVE REPLACEMENT N/A 11/24/2019   Procedure: AORTIC VALVE REPLACEMENT (AVR) using INSPIRIS Resilia 23 MM Bioprosthetic Aortic Valve.;  Surgeon: Alleen Borne, MD;  Location: [facility] OR;  Service: Open Heart Surgery;  Laterality: N/A;   APPLICATION OF WOUND VAC Left 10/17/2020   Procedure: APPLICATION OF WOUND VAC;  Surgeon: Nada Libman, MD;  Location: [facility] OR;  Service: Vascular;  Laterality: Left;   BACK SURGERY     2015   CATARACT EXTRACTION, BILATERAL  2022   COLONOSCOPY  2009   MS-F/V-mov(exc)-HPP   CORONARY ARTERY BYPASS GRAFT N/A 11/24/2019   Procedure: CORONARY ARTERY BYPASS GRAFTING (CABG) using LIMA to LAD.;  Surgeon: Alleen Borne, MD;  Location: [facility] OR;  Service: Open Heart Surgery;  Laterality: N/A;   ELBOW SURGERY     bilaterally  for cubital tunnel   ENDARTERECTOMY N/A 10/03/2020   Procedure: AORTIC ENDARTERECTOMY;  Surgeon: Nada Libman, MD;  Location: [facility] OR;  Service: Vascular;  Laterality: N/A;   ENDARTERECTOMY FEMORAL Bilateral 10/03/2020   Procedure: BILATERAL FEMORAL ENDARTERECTOMY;  Surgeon: Nada Libman, MD;  Location: [facility] OR;  Service: Vascular;  Laterality: Bilateral;   HERNIA REPAIR     with mesh   INCISION AND DRAINAGE Left 01/02/2021   Procedure: INCISION AND DRAINAGE LEFT GROIN WITH STIMULAN BEADS;  Surgeon: Nada Libman, MD;  Location: [facility] OR;  Service: Vascular;  Laterality: Left;   INCISION AND DRAINAGE OF WOUND Left 10/17/2020   Procedure: EXPLORATION LEFT GROIN WOUND;  Surgeon: Nada Libman, MD;  Location: [facility] OR;  Service: Vascular;  Laterality: Left;   MAXIMUM ACCESS (MAS)POSTERIOR LUMBAR INTERBODY FUSION (PLIF) 1 LEVEL N/A  10/25/2014   Procedure: LUMBAR FOUR TO FIVE MAXIMUM ACCESS (MAS) POSTERIOR LUMBAR INTERBODY FUSION (PLIF) 1 LEVEL;  Surgeon: Tia Alert, MD;  Location: [facility] NEURO ORS;  Service: Neurosurgery;  Laterality: N/A;   RIGHT/LEFT HEART CATH AND CORONARY ANGIOGRAPHY N/A 11/10/2019   Procedure: RIGHT/LEFT HEART CATH AND CORONARY ANGIOGRAPHY;  Surgeon: Swaziland, [name], MD;  Location: [facility] INVASIVE CV LAB;  Service: Cardiovascular;  Laterality: N/A;   SKIN BIOPSY Left 10/12/2018   shave forehead Hypertrophic actinic kertosis with features of a verruca   TEE WITHOUT CARDIOVERSION N/A 11/24/2019   Procedure: TRANSESOPHAGEAL ECHOCARDIOGRAM (TEE);  Surgeon: Alleen Borne, MD;  Location: [facility] OR;  Service: Open Heart Surgery;  Laterality: N/A;   TONSILLECTOMY  age 9   VASECTOMY     X 2   VASECTOMY REVERSAL     Patient Active Problem List   Diagnosis Date Noted   Subarachnoid hemorrhage following injury (HCC) 08/26/2023   Skull fracture with cerebral contusion (HCC) 08/26/2023   Temporal bone fracture (HCC) 08/26/2023   Aortic atherosclerosis (HCC) 05/26/2023   Acute non-recurrent pansinusitis 01/04/2023   Perennial allergic rhinitis 07/09/2022   Asthma-COPD overlap syndrome (HCC) 04/21/2022   Recurrent infections 04/21/2022   S/P aortobifemoral bypass surgery 04/03/2021   S/P CABG x 1 11/24/2019   S/P aortic valve replacement with bioprosthetic valve 11/24/2019   PAD (peripheral artery disease) (HCC)    Mild persistent asthma 10/29/2019   RBBB (right bundle branch block with left anterior fascicular block)    Peripheral neuropathy    S/P lumbar spinal fusion 10/25/2014   Recovering alcoholic in remission (HCC) 08/10/2014   Substance induced mood disorder (HCC) 06/30/2014   Substance-induced sleep disorder (HCC) 06/30/2014   Obsessive compulsive disorder    OSA (obstructive sleep apnea) 01/20/2014   CAD (coronary artery disease)    Mentally disabled 11/25/2012   Hyperlipidemia with target LDL  less than 130 11/25/2012   Bipolar disorder (HCC)    Hypertension    Allergic rhinitis due to pollen 08/11/2011    ONSET DATE: 08/28/2023  REFERRING DIAG: R55 (ICD-10-CM) - Syncope, unspecified syncope type I60.9 (ICD-10-CM) - Subarachnoid hemorrhage (HCC)  THERAPY DIAG:  Unsteadiness on feet  Other abnormalities of gait and mobility  Muscle weakness (generalized)  Dizziness and giddiness  History of falling  Rationale for Evaluation and Treatment: Rehabilitation  SUBJECTIVE:  SUBJECTIVE STATEMENT: HOH on L side due to temporal bone fx  Patient arrives to session with 2WW. Denies falls/near falls. Reports partial compliance with HEP.   Pt accompanied by:  Wife, Chales Abrahams   PERTINENT HISTORY: CABG, aortic valve replacement (2021)  PAIN:  Are you having pain? Yes: NPRS scale: 3/10 Pain location: Low back and neck Pain description: In low back, achy. In head, disorientation Aggravating factors: Movement  Relieving factors: Ice   PRECAUTIONS: Fall and Other: no bending forward (skull fx)   RED FLAGS: None   WEIGHT BEARING RESTRICTIONS: No  FALLS: Has patient fallen in last 6 months? Yes. Number of falls 2 (hit head both times)   LIVING ENVIRONMENT: Lives with: lives with their spouse Lives in: House/apartment Stairs: Yes: External: 4 steps; on left going up Has following equipment at home: Dan Humphreys - 2 wheeled and shower chair  PLOF: Independent  PATIENT GOALS: "To reduce my symptoms."  OBJECTIVE:   DIAGNOSTIC FINDINGS:   CT Head wo Contrast 09/08/2023 IMPRESSION: 1. Expected evolution of multicompartmental intracranial hemorrhage since 08/28/2023, with no residual hyperdense blood. 2. 3 mm thick hypodense right parietal convexity subdural collection is unchanged and may  reflect a subdural hygroma or evolving subdural hematoma. 3. Unchanged right temporal/parietal bone fracture.  CT Lumbar Spine wo Contrast 09/08/2023: IMPRESSION: 1. No acute fracture or traumatic malalignment of the lumbar spine. 2. Postsurgical changes reflecting posterior instrumented fusion and decompression at L5-S1 without residual spinal canal or neural foraminal stenosis. 3. Adjacent segment disease at L3-L4 resulting in at least moderate spinal canal stenosis and moderate bilateral neural foraminal stenosis.  CT of head from 08/27/23   IMPRESSION: 1. No significant interval change in bilateral frontal convexity subdural fluid collection measuring up to 6 mm. 2. Redemonstrated subarachnoid hemorrhage and hemorrhagic contusions along the bilateral anterior frontal lobes, slightly increased on the left measuring up 2.1 cm ( previously 1.9 cm), and unchanged along the right frontal lobe. Compared to prior exam there is interval increase in conspicuity of subarachnoid blood products along the bilateral parietal lobes, likely due to redistribution. No evidence of intraventricular extension. No hydrocephalus. 3. Redemonstrated acute nondisplaced fracture involving the right temporal bone.  COGNITION: Overall cognitive status: Impaired   SENSATION: WFL  POSTURE: rounded shoulders and forward head  BED MOBILITY:  Independent per pt  TRANSFERS: Assistive device utilized: Environmental consultant - 2 wheeled  Sit to stand: SBA Stand to sit: SBA   GAIT: Gait pattern: step through pattern, decreased stride length, antalgic, lateral hip instability, decreased trunk rotation, and trunk flexed Distance walked: Various clinic distances  Assistive device utilized: Walker - 2 wheeled Level of assistance: CGA Comments: Pt very rigid and antalgic. No LOB noted but does require min cues to stay inside RW   FUNCTIONAL TESTS:  5x STS and MCTSIB to be assessed   PATIENT SURVEYS:  [place] to be  obtained  VITALS  Vitals:   09/24/23 1029 09/24/23 1042  BP: (!) 128/43 (!) 149/56  Pulse: (!) 49 (!) 49  Asymptomatic for LBP  TODAY'S TREATMENT:           NMR  SciFit levl 3 x 8 min on multi peak setting for cardiorespiratory conditioning, BP elevation, dual task with oculomotor scanning to HART charts set on either side of the SciFit  Patient looses place on second round through HART chart and unable to re-orient despite therapist and spouse redirecting attention; has to start tasks entirely over  In // bars for improved visual tracking,  balance strategies, and dual task  Standing on rocker board in A/P direction tossing bean bag to target with cross body reaching and same side reaching 2 x 8 bean bags with each (requires intermittent redirection) Standing on foam pad with bean bag hand off to therapist at edges of visual field with HART chart tasks Requires max cues to move eyes form HART chart to see where therapist hand and basket with bean bags is  PATIENT EDUCATION: Education details: continue HEP Person educated: Patient and Spouse Education method: Explanation and Handouts Education comprehension: verbalized understanding and needs further education  HOME EXERCISE PROGRAM:  Access Code: [email or web address] URL: [email or web address] Date: 09/08/2023 Prepared by: Maryruth Eve  Exercises - Corner Balance Feet Together With Eyes Closed  - 1 x daily - 7 x weekly - 3 sets - 30 seconds hold - Pencil Pushups  - 1 x daily - 7 x weekly - 3 sets - 10 reps - Seated Horizontal Saccades  - 1 x daily - 7 x weekly - 3 sets - 10 reps - Seated Vertical Saccades  - 1 x daily - 7 x weekly - 3 sets - 10 reps -[place] eye tracking (1 paragraph)  -King Devick's Saccades 1x a day   Education on SPOT it game and HART chart use, VOR x 1 viewing 3 x 5 rounds Brock string ~3x a day with spouse guiding through   GOALS: Goals reviewed with patient? Yes  SHORT TERM GOALS: Target date:  09/29/2023   Pt will be independent with initial HEP for improved balance, transfers and gait.  Baseline: not established on eval  Goal status: INITIAL  2.  Patient will improve RPQ-3 score to </= 2 per item to indicate reduced severity of post-concussive symptoms to progress towards PLOF.    Baseline: 3/4 Goal status: INITIAL  3.  5x STS to be assessed and STG/LTG updated  Baseline: 15.65 seconds without UE use (SBA) Goal status: Discontinued STG due to how well performed   4.  Patient will improve mCTSIB score to 100/120 to indicate improved integration of vestibular, proprioceptive, and visual balance systems during static balance tasks in order to reduce risk for falls.   Baseline: 86/120 Goal status: INITIAL   LONG TERM GOALS: Target date: 10/27/2023   Pt will be independent with final HEP for improved balance, transfers and gait.  Baseline:  Goal status: INITIAL  2.  Patient will improve RPQ-13 score to </= 2 per item to indicate reduced severity of post-concussive symptoms to progress towards PLOF.   Baseline: greatest deficit reported 4/4  Goal status: INITIAL  3.  Patient will improve their 5x Sit to Stand score to less than 13 seconds to demonstrate a decreased risk for falls and improved LE strength.   Baseline: 15.65 seconds without UE use (SBA) Goal status: INITIAL  4. Patient will improve mCTSIB score to 120/120 to indicate improved integration of vestibular, proprioceptive, and visual balance systems during static balance tasks in order to reduce risk for falls.   Baseline: 86/120 Goal status: INITIAL   ASSESSMENT:  CLINICAL IMPRESSION: Emphasis of skilled PT session on transition between tasks and dual task with emphasis on oculomotor scanning and work. Patient demonstrates significant challenge switching between part of tasks and requires frequent redirection to prevent hyperfixation. Demonstrates need to start tasks over such as HART chart reading if  looses place and requires cues to fully scan visual field. Balance otherwise good in today's session but is largely limited by cognition. Continue POC.  OBJECTIVE IMPAIRMENTS: Abnormal gait, decreased activity tolerance, decreased balance, decreased cognition, decreased coordination, decreased endurance, decreased knowledge of condition, decreased knowledge of use of DME, decreased mobility, difficulty walking, decreased strength, decreased safety awareness, dizziness, impaired perceived functional ability, impaired vision/preception, and pain  ACTIVITY LIMITATIONS: carrying, lifting, bending, sleeping, stairs, transfers, hygiene/grooming, locomotion level, and caring for others  PARTICIPATION LIMITATIONS: meal prep, cleaning, laundry, interpersonal relationship, driving, shopping, community activity, occupation, and yard work  PERSONAL FACTORS: Fitness, Past/current experiences, and 3+ comorbidities: Bradycardia, post-concussion w/LOC,   are also affecting patient's functional outcome.   REHAB POTENTIAL: Good  CLINICAL DECISION MAKING: Unstable/unpredictable  EVALUATION COMPLEXITY: High  PLAN:  PT FREQUENCY: 2x/week  PT DURATION: 8 weeks  PLANNED INTERVENTIONS: Therapeutic exercises, Therapeutic activity, Neuromuscular re-education, Balance training, Gait training, Patient/Family education, Self Care, Joint mobilization, Stair training, Vestibular training, Canalith repositioning, DME instructions, Aquatic Therapy, Dry Needling, Electrical stimulation, Manual therapy, and Re-evaluation  PLAN FOR NEXT SESSION: progress occulomotor/vestibular exercises (VORx1 as tolerated, Brock string), progress corner balance (EC on foam, tandem with head turns as able etc), consider neuropthomology referral pending progress; did OT/speech get scheduled, HART chart with balance work, work on Building surveyor with balance training or dynamic gait/balance without AD with visual scanning, midline orientation,  assess goals  Carmelia Bake, PT, DPT 09/24/2023, 12:13 PM

## 2023-09-28 ENCOUNTER — Other Ambulatory Visit (INDEPENDENT_AMBULATORY_CARE_PROVIDER_SITE_OTHER): Payer: PPO

## 2023-09-28 ENCOUNTER — Ambulatory Visit: Payer: PPO | Attending: Cardiology | Admitting: Cardiology

## 2023-09-28 ENCOUNTER — Other Ambulatory Visit: Payer: Self-pay | Admitting: Cardiology

## 2023-09-28 ENCOUNTER — Encounter: Payer: Self-pay | Admitting: Cardiology

## 2023-09-28 VITALS — BP 118/58 | HR 47 | Ht 65.0 in | Wt 136.8 lb

## 2023-09-28 DIAGNOSIS — R001 Bradycardia, unspecified: Secondary | ICD-10-CM

## 2023-09-28 DIAGNOSIS — Z953 Presence of xenogenic heart valve: Secondary | ICD-10-CM

## 2023-09-28 DIAGNOSIS — R55 Syncope and collapse: Secondary | ICD-10-CM | POA: Diagnosis not present

## 2023-09-28 DIAGNOSIS — Z951 Presence of aortocoronary bypass graft: Secondary | ICD-10-CM

## 2023-09-28 DIAGNOSIS — I452 Bifascicular block: Secondary | ICD-10-CM

## 2023-09-28 DIAGNOSIS — E785 Hyperlipidemia, unspecified: Secondary | ICD-10-CM

## 2023-09-28 DIAGNOSIS — I779 Disorder of arteries and arterioles, unspecified: Secondary | ICD-10-CM | POA: Diagnosis not present

## 2023-09-28 DIAGNOSIS — R42 Dizziness and giddiness: Secondary | ICD-10-CM

## 2023-09-28 DIAGNOSIS — I10 Essential (primary) hypertension: Secondary | ICD-10-CM

## 2023-09-28 NOTE — Progress Notes (Unsigned)
Enrolled for [employer] to mail a ZIO AT Live Telemetry monitor to patients address on file.   Dr Swaziland to read.

## 2023-09-28 NOTE — Patient Instructions (Signed)
Medication Instructions:  No Changes *If you need a refill on your cardiac medications before your next appointment, please call your pharmacy*   Lab Work: Today: BMP, TSH, and Magnesium If you have labs (blood work) drawn today and your tests are completely normal, you will receive your results only by: MyChart Message (if you have MyChart) OR A paper copy in the mail If you have any lab test that is abnormal or we need to change your treatment, we will call you to review the results.   Testing/Procedures:  Your physician has requested that you have an echocardiogram. Echocardiography is a painless test that uses sound waves to create images of your heart. It provides your doctor with information about the size and shape of your heart and how well your heart's chambers and valves are working. This procedure takes approximately one hour. There are no restrictions for this procedure. Please do NOT wear cologne, perfume, aftershave, or lotions (deodorant is allowed). Please arrive 15 minutes prior to your appointment time.   ZIO AT Long term monitor-Live Telemetry  Your physician has requested you wear a ZIO patch monitor for 14 days.  This is a single patch monitor. [employer] supplies one patch monitor per enrollment. Additional  stickers are not available.  Please do not apply patch if you will be having a Nuclear Stress Test, Echocardiogram, Cardiac CT, MRI,  or Chest Xray during the period you would be wearing the monitor. The patch cannot be worn during  these tests. You cannot remove and re-apply the ZIO AT patch monitor.  Your ZIO patch monitor will be mailed 3 day USPS to your address on file. It may take 3-5 days to  receive your monitor after you have been enrolled.  Once you have received your monitor, please review the enclosed instructions. Your monitor has  already been registered assigning a specific monitor serial # to you.   Billing and Patient Assistance Program  information  Meredeth Ide has been supplied with any insurance information on record for billing. [employer] offers a sliding scale Patient Assistance Program for patients without insurance, or whose  insurance does not completely cover the cost of the ZIO patch monitor. You must apply for the  Patient Assistance Program to qualify for the discounted rate. To apply, call [employer] at [phone],  select option 4, select option 2 , ask to apply for the Patient Assistance Program, (you can request an  interpreter if needed). [employer] will ask your household income and how many people are in your  household. [employer] will quote your out-of-pocket cost based on this information. They will also be able  to set up a 12 month interest free payment plan if needed.  Applying the monitor   Shave hair from upper left chest.  Hold the abrader disc by orange tab. Rub the abrader in 40 strokes over left upper chest as indicated in  your monitor instructions.  Clean area with 4 enclosed alcohol pads. Use all pads to ensure the area is cleaned thoroughly. Let  dry.  Apply patch as indicated in monitor instructions. Patch will be placed under collarbone on left side of  chest with arrow pointing upward.  Rub patch adhesive wings for 2 minutes. Remove the white label marked "1". Remove the white label  marked "2". Rub patch adhesive wings for 2 additional minutes.  While looking in a mirror, press and release button in center of patch. A small green light will flash 3-4  times. This will be  your only indicator that the monitor has been turned on.  Do not shower for the first 24 hours. You may shower after the first 24 hours.  Press the button if you feel a symptom. You will hear a small click. Record Date, Time and Symptom in  the Patient Log.   Starting the Gateway  In your kit there is a Audiological scientist box the size of a cellphone. This is Buyer, retail. It transmits all your  recorded data to [facility]. This box  must always stay within 10 feet of you. Open the box and push the *  button. There will be a light that blinks orange and then green a few times. When the light stops  blinking, the Gateway is connected to the ZIO patch. Call [employer] at [phone] to confirm your monitor is transmitting.  Returning your monitor  Remove your patch and place it inside the Gateway. In the lower half of the Gateway there is a white  bag with prepaid postage on it. Place Gateway in bag and seal. Mail package back to [place] as soon as  possible. Your physician should have your final report approximately 7 days after you have mailed back  your monitor. Call [facility] Customer Care at [phone] if you have questions regarding your ZIO AT  patch monitor. Call them immediately if you see an orange light blinking on your monitor.  If your monitor falls off in less than 4 days, contact our Monitor department at [phone]. If your  monitor becomes loose or falls off after 4 days call [employer] at [phone] for suggestions on  securing your monitor   Follow-Up: At [facility], you and your health needs are our priority.  As part of our continuing mission to provide you with exceptional heart care, we have created designated Provider Care Teams.  These Care Teams include your primary Cardiologist (physician) and Advanced Practice Providers (APPs -  Physician Assistants and Nurse Practitioners) who all work together to provide you with the care you need, when you need it.  We recommend signing up for the patient portal called "MyChart".  Sign up information is provided on this After Visit Summary.  MyChart is used to connect with patients for Virtual Visits (Telemedicine).  Patients are able to view lab/test results, encounter notes, upcoming appointments, etc.  Non-urgent messages can be sent to your provider as well.   To learn more about what you can do with MyChart, go to  [email or web address].    Your next appointment:   6 week(s)  Provider:   Any APP  Other Instructions EP Referral

## 2023-09-29 ENCOUNTER — Ambulatory Visit: Payer: PPO | Admitting: Physical Therapy

## 2023-09-29 LAB — BASIC METABOLIC PANEL
BUN/Creatinine Ratio: 16 (ref 10–24)
BUN: 16 mg/dL (ref 8–27)
CO2: 25 mmol/L (ref 20–29)
Calcium: 9.3 mg/dL (ref 8.6–10.2)
Chloride: 104 mmol/L (ref 96–106)
Creatinine, Ser: 0.98 mg/dL (ref 0.76–1.27)
Glucose: 82 mg/dL (ref 70–99)
Potassium: 4.9 mmol/L (ref 3.5–5.2)
Sodium: 144 mmol/L (ref 134–144)
eGFR: 81 mL/min/{1.73_m2} (ref >59)

## 2023-09-29 LAB — TSH: TSH: 1.86 u[IU]/mL (ref 0.450–4.500)

## 2023-09-29 LAB — MAGNESIUM: Magnesium: 2.2 mg/dL (ref 1.6–2.3)

## 2023-09-29 NOTE — Therapy (Incomplete)
OUTPATIENT OCCUPATIONAL THERAPY NEURO EVALUATION  Patient Name: Gregory [name] MRN: [id number] DOB:1951/02/12, 72 y.o., male Today's Date: 09/30/2023  PCP: Ronnald Nian, MD REFERRING PROVIDER: Ronnald Nian, MD  END OF SESSION:  OT End of Session - 09/30/23 0945     Visit Number 1    Number of Visits 13    Date for OT Re-Evaluation 11/13/23    Authorization Type [employer]    Progress Note Due on Visit 10    OT Start Time 0848    OT Stop Time 0935    OT Time Calculation (min) 47 min    Activity Tolerance Patient tolerated treatment well    Behavior During Therapy [facility] for tasks assessed/performed             Past Medical History:  Diagnosis Date   Alcohol abuse    Aortic stenosis    Arthritis    Asthma    exercise indused or pollen-uses inhaler dail yand has rescue inhaler if needed   Bipolar disorder (HCC)    CAD (coronary artery disease)    coronary calcifications 08/2013 CTA   Carotid artery occlusion    Cataract    bilateral sx   Chronic back pain    Claudication (HCC)    Complication of anesthesia 10/16/2020   "had a panic attack when the mask was put on me"" I do not think I was given anything  before, I need something to calm me down"   Depression    Family history of skin cancer    Heart murmur    as an infant   Hepatitis    h/o 1970,doesn't remember which type,1990 epstain-barr   Herpes zoster without complication 09/05/2019   Hyperlipidemia    Hypertension    Neuromuscular disorder (HCC)    Obsessive compulsive disorder    PAD (peripheral artery disease) (HCC)    Peripheral neuropathy    Pneumonia    RBBB    RBBB (right bundle branch block with left anterior fascicular block)    Seasonal allergies    Severe aortic stenosis    Sleep apnea    does not wear CPAP   Smoker    Spinal stenosis at L4-L5 level    Tobacco abuse    Tobacco use disorder    Past Surgical History:  Procedure Laterality Date   ANAL FISTULECTOMY  09/25/2011    AORTA - BILATERAL FEMORAL ARTERY BYPASS GRAFT N/A 10/03/2020   Procedure: AORTA BIFEMORAL BYPASS GRAFT USING A HEMASHIELD GOLD BIFURCATED 14 X 7mm GRAFT AND INFERIOR MESENTERIC ARTERY REIMPLANTATION;  Surgeon: Nada Libman, MD;  Location: [facility] OR;  Service: Vascular;  Laterality: N/A;   AORTIC VALVE REPLACEMENT N/A 11/24/2019   Procedure: AORTIC VALVE REPLACEMENT (AVR) using INSPIRIS Resilia 23 MM Bioprosthetic Aortic Valve.;  Surgeon: Alleen Borne, MD;  Location: [facility] OR;  Service: Open Heart Surgery;  Laterality: N/A;   APPLICATION OF WOUND VAC Left 10/17/2020   Procedure: APPLICATION OF WOUND VAC;  Surgeon: Nada Libman, MD;  Location: [facility] OR;  Service: Vascular;  Laterality: Left;   BACK SURGERY     2015   CATARACT EXTRACTION, BILATERAL  2022   COLONOSCOPY  2009   MS-F/V-mov(exc)-HPP   CORONARY ARTERY BYPASS GRAFT N/A 11/24/2019   Procedure: CORONARY ARTERY BYPASS GRAFTING (CABG) using LIMA to LAD.;  Surgeon: Alleen Borne, MD;  Location: [facility] OR;  Service: Open Heart Surgery;  Laterality: N/A;   ELBOW SURGERY     bilaterally  for cubital tunnel   ENDARTERECTOMY N/A 10/03/2020   Procedure: AORTIC ENDARTERECTOMY;  Surgeon: Nada Libman, MD;  Location: [facility] OR;  Service: Vascular;  Laterality: N/A;   ENDARTERECTOMY FEMORAL Bilateral 10/03/2020   Procedure: BILATERAL FEMORAL ENDARTERECTOMY;  Surgeon: Nada Libman, MD;  Location: [facility] OR;  Service: Vascular;  Laterality: Bilateral;   HERNIA REPAIR     with mesh   INCISION AND DRAINAGE Left 01/02/2021   Procedure: INCISION AND DRAINAGE LEFT GROIN WITH STIMULAN BEADS;  Surgeon: Nada Libman, MD;  Location: [facility] OR;  Service: Vascular;  Laterality: Left;   INCISION AND DRAINAGE OF WOUND Left 10/17/2020   Procedure: EXPLORATION LEFT GROIN WOUND;  Surgeon: Nada Libman, MD;  Location: [facility] OR;  Service: Vascular;  Laterality: Left;   MAXIMUM ACCESS (MAS)POSTERIOR LUMBAR INTERBODY FUSION (PLIF) 1 LEVEL N/A 10/25/2014   Procedure:  LUMBAR FOUR TO FIVE MAXIMUM ACCESS (MAS) POSTERIOR LUMBAR INTERBODY FUSION (PLIF) 1 LEVEL;  Surgeon: Tia Alert, MD;  Location: [facility] NEURO ORS;  Service: Neurosurgery;  Laterality: N/A;   RIGHT/LEFT HEART CATH AND CORONARY ANGIOGRAPHY N/A 11/10/2019   Procedure: RIGHT/LEFT HEART CATH AND CORONARY ANGIOGRAPHY;  Surgeon: Swaziland, [name], MD;  Location: [facility] INVASIVE CV LAB;  Service: Cardiovascular;  Laterality: N/A;   SKIN BIOPSY Left 10/12/2018   shave forehead Hypertrophic actinic kertosis with features of a verruca   TEE WITHOUT CARDIOVERSION N/A 11/24/2019   Procedure: TRANSESOPHAGEAL ECHOCARDIOGRAM (TEE);  Surgeon: Alleen Borne, MD;  Location: [facility] OR;  Service: Open Heart Surgery;  Laterality: N/A;   TONSILLECTOMY  age 72   VASECTOMY     X 2   VASECTOMY REVERSAL     Patient Active Problem List   Diagnosis Date Noted   Subarachnoid hemorrhage following injury (HCC) 08/26/2023   Skull fracture with cerebral contusion (HCC) 08/26/2023   Temporal bone fracture (HCC) 08/26/2023   Aortic atherosclerosis (HCC) 05/26/2023   Acute non-recurrent pansinusitis 01/04/2023   Perennial allergic rhinitis 07/09/2022   Asthma-COPD overlap syndrome (HCC) 04/21/2022   Recurrent infections 04/21/2022   S/P aortobifemoral bypass surgery 04/03/2021   S/P CABG x 1 11/24/2019   S/P aortic valve replacement with bioprosthetic valve 11/24/2019   PAD (peripheral artery disease) (HCC)    Mild persistent asthma 10/29/2019   RBBB (right bundle branch block with left anterior fascicular block)    Peripheral neuropathy    S/P lumbar spinal fusion 10/25/2014   Recovering alcoholic in remission (HCC) 08/10/2014   Substance induced mood disorder (HCC) 06/30/2014   Substance-induced sleep disorder (HCC) 06/30/2014   Obsessive compulsive disorder    OSA (obstructive sleep apnea) 01/20/2014   CAD (coronary artery disease)    Mentally disabled 11/25/2012   Hyperlipidemia with target LDL less than 130 11/25/2012    Bipolar disorder (HCC)    Hypertension    Allergic rhinitis due to pollen 08/11/2011    ONSET DATE: 09/15/2023 (referral date)   REFERRING DIAG: S06.9X1D (ICD-10-CM) - Traumatic brain injury, with loss of consciousness of 30 minutes or less, subsequent encounter R41.3 (ICD-10-CM) - Memory deficit  THERAPY DIAG:  Unsteadiness on feet - Plan: Ot plan of care cert/re-cert  Muscle weakness (generalized) - Plan: Ot plan of care cert/re-cert  Visuospatial deficit - Plan: Ot plan of care cert/re-cert  Other symptoms and signs involving cognitive functions following nontraumatic subarachnoid hemorrhage - Plan: Ot plan of care cert/re-cert  Rationale for Evaluation and Treatment: Rehabilitation  SUBJECTIVE:   SUBJECTIVE STATEMENT: I'm not sure what my goals  are with O.T.  Pt accompanied by:  Spouse  PERTINENT HISTORY: presenting after syncopal event and fall on 08/25/23. Imaging showed acute subarachnoid hemorrhage with 1.8 cm inferior left frontal lobe hemorrhagic contusion without substantial mass effect and nondisplaced fracture of the right temporoparietal calvarium. PMH: CABG, aortic valve replacement, HTN, HLD, bipolar, bradycardia  PRECAUTIONS: Other: no driving, fall risk, syncope, possible heart monitor coming tomorrow d/t bradycardia  WEIGHT BEARING RESTRICTIONS: No  PAIN:  Are you having pain?  Right headache 3/10 - O.T. not addressing directly   FALLS: Has patient fallen in last 6 months? Yes. 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  HAND DOMINANCE: Left  ADLs: Eating: assist to cut food  occasionally Grooming: independent UB Dressing: independent LB Dressing: independent Toileting: independent (raised) Bathing: mod I - standing some, seated some Tub Shower transfers: mod I  Equipment: Shower seat with back  IADLs: Shopping: pt goes with wife (pt used to do) Light housekeeping: wife always does majority, but pt has not returned to full laundry tasks Meal Prep: wife doing now for safety reasons, but both pt and wife took turns prior to injury Community mobility: pt currently not driving since injury Medication management: independent Financial management: wife has always done  Handwriting:  pt reports always been horrible - no changes.   MOBILITY STATUS:  uses walker   ACTIVITY TOLERANCE: Activity tolerance: fatigues quicker   UPPER EXTREMITY ROM:  BUE AROM WFL's - pt has flexed ring and small fingers Lt hand from old injury but can fully open for short amounts of time   UPPER EXTREMITY MMT:   RUE 5/5 grossly, LUE 4+/5 grossly at shoulders   HAND FUNCTION: Grip strength: Right: 57.5 lbs; Left: 61.9 lbs  COORDINATION: 9 Hole Peg test: Right: 36.06 sec; Left: 35.92 sec (pt denies change and reports coordination bilaterally has been slightly slower for some time)   SENSATION: WFL Per pt report  EDEMA: none   COGNITION: Overall cognitive status:  difficult to assess, ? Safety awareness and executive functioning - see speech evaluation for details  VISION: Subjective report: not sure about visual changes, denies diplopia but wife reports issues with saccades and P.T. gave ex's for this and convergence Baseline vision: Wears glasses for reading only Visual history: corrective eye surgery for cataracts  VISION ASSESSMENT: TBA - d/t time constraints during evaluation   PERCEPTION: Not tested  PRAXIS: Not tested  OBSERVATIONS: very verbose, occasional redirection needed, ? Safety awareness   TODAY'S TREATMENT:  N/A today - evaluation only  PATIENT EDUCATION: Education details: OT POC Person educated: Patient and Spouse Education method: Explanation Education comprehension: verbalized understanding  HOME EXERCISE PROGRAM: N/A   GOALS: Goals reviewed with patient? Yes  SHORT TERM GOALS: Target date: 10/21/23  Pt to be independent with HEP for overall strength/endurance UEs Baseline: Goal status: INITIAL  2.  Pt to return to standing for minimum of 10 minutes to perform light IADL's (folding clothes, washing dishes)  Baseline:  Goal status: INITIAL  3.  Pt to verbalize understanding with safety considerations for fall prevention during IADLS including appropriate use of walker for kitchen negotiation Baseline:  Goal status: INITIAL    LONG TERM GOALS: Target date: 11/13/23  Pt to perform environmental scanning in moderately distracting gym with 85% accuracy or greater in prep for potential return to driving Baseline:  Goal status: INITIAL  2.  Pt to demo safety w/ simple cooking task with direct supervision but no physical assist Baseline:  Goal status: INITIAL  3.  Vision goal TBA and updated prn Baseline:  Goal status: INITIAL   ASSESSMENT:  CLINICAL IMPRESSION: Patient is a 72 y.o. male who was seen today for occupational therapy evaluation for TBI from fall w/ SAH d/t syncope . Hx includes CABG, aortic valve replacement, HTN, HLD, bipolar, bradycardia. Patient currently presents below baseline level of functioning demonstrating functional deficits and impairments as noted below. Pt would benefit from skilled OT services in the outpatient setting to work on impairments as noted below to help pt return to PLOF as able.   [name]   PERFORMANCE DEFICITS: in functional skills including IADLs, strength, Fine motor control, mobility, balance, endurance, cardiopulmonary status limiting function,  decreased knowledge of precautions, decreased knowledge of use of DME, vision, and UE functional use, cognitive skills including problem solving and safety awareness, and psychosocial skills including coping strategies.   IMPAIRMENTS: are limiting patient from IADLs, work, and leisure.   CO-MORBIDITIES: may have co-morbidities  that affects occupational performance. Patient will benefit from skilled OT to address above impairments and improve overall function.  MODIFICATION OR ASSISTANCE TO COMPLETE EVALUATION: No modification of tasks or assist necessary to complete an evaluation.  OT OCCUPATIONAL PROFILE AND HISTORY: Problem focused assessment: Including review of records relating to presenting problem.  CLINICAL DECISION MAKING: Moderate - several treatment options, min-mod task modification necessary  REHAB POTENTIAL: Good  EVALUATION COMPLEXITY: Low    PLAN:  OT FREQUENCY: 2x/week  OT DURATION: 6 weeks, plus evaluation  PLANNED INTERVENTIONS: self care/ADL training, therapeutic exercise, therapeutic activity, neuromuscular re-education, manual therapy, passive range of motion, functional mobility training, splinting, patient/family education, cognitive remediation/compensation, visual/perceptual remediation/compensation, coping strategies training, DME and/or AE instructions, and Re-evaluation  RECOMMENDED OTHER SERVICES: none at this time  CONSULTED AND AGREED WITH PLAN OF CARE: Patient and spouse  PLAN FOR NEXT SESSION: further assess vision and update LTG #3 prn, work on endurance, UE strengthening, standing tolerance   Sheran Lawless, OT 09/30/2023, 10:04 AM

## 2023-09-30 ENCOUNTER — Ambulatory Visit: Payer: PPO | Admitting: Speech Pathology

## 2023-09-30 ENCOUNTER — Ambulatory Visit: Payer: PPO | Admitting: Physical Therapy

## 2023-09-30 ENCOUNTER — Telehealth: Payer: Self-pay | Admitting: Cardiology

## 2023-09-30 ENCOUNTER — Encounter: Payer: Self-pay | Admitting: Occupational Therapy

## 2023-09-30 ENCOUNTER — Ambulatory Visit: Payer: PPO | Admitting: Occupational Therapy

## 2023-09-30 VITALS — BP 139/60 | HR 45

## 2023-09-30 DIAGNOSIS — I69018 Other symptoms and signs involving cognitive functions following nontraumatic subarachnoid hemorrhage: Secondary | ICD-10-CM

## 2023-09-30 DIAGNOSIS — M6281 Muscle weakness (generalized): Secondary | ICD-10-CM

## 2023-09-30 DIAGNOSIS — R2681 Unsteadiness on feet: Secondary | ICD-10-CM

## 2023-09-30 DIAGNOSIS — R001 Bradycardia, unspecified: Secondary | ICD-10-CM

## 2023-09-30 DIAGNOSIS — I452 Bifascicular block: Secondary | ICD-10-CM | POA: Diagnosis not present

## 2023-09-30 DIAGNOSIS — R2689 Other abnormalities of gait and mobility: Secondary | ICD-10-CM

## 2023-09-30 DIAGNOSIS — R42 Dizziness and giddiness: Secondary | ICD-10-CM

## 2023-09-30 DIAGNOSIS — R41842 Visuospatial deficit: Secondary | ICD-10-CM

## 2023-09-30 DIAGNOSIS — R41841 Cognitive communication deficit: Secondary | ICD-10-CM

## 2023-09-30 NOTE — Therapy (Signed)
OUTPATIENT SPEECH LANGUAGE PATHOLOGY EVALUATION   Patient Name: Gregory [name] MRN: [id number] DOB:1951/10/29, 72 y.o., male Today's Date: 09/30/2023  PCP: Ronnald Nian, MD REFERRING PROVIDER: Ronnald Nian, MD  END OF SESSION:  End of Session - 09/30/23 1331     Visit Number 1    Number of Visits 18    Date for SLP Re-Evaluation 11/25/23    SLP Start Time 0930    SLP Stop Time  1015    SLP Time Calculation (min) 45 min    Activity Tolerance Patient tolerated treatment well             Past Medical History:  Diagnosis Date   Alcohol abuse    Aortic stenosis    Arthritis    Asthma    exercise indused or pollen-uses inhaler dail yand has rescue inhaler if needed   Bipolar disorder (HCC)    CAD (coronary artery disease)    coronary calcifications 08/2013 CTA   Carotid artery occlusion    Cataract    bilateral sx   Chronic back pain    Claudication (HCC)    Complication of anesthesia 10/16/2020   "had a panic attack when the mask was put on me"" I do not think I was given anything  before, I need something to calm me down"   Depression    Family history of skin cancer    Heart murmur    as an infant   Hepatitis    h/o 1970,doesn't remember which type,1990 epstain-barr   Herpes zoster without complication 09/05/2019   Hyperlipidemia    Hypertension    Neuromuscular disorder (HCC)    Obsessive compulsive disorder    PAD (peripheral artery disease) (HCC)    Peripheral neuropathy    Pneumonia    RBBB    RBBB (right bundle branch block with left anterior fascicular block)    Seasonal allergies    Severe aortic stenosis    Sleep apnea    does not wear CPAP   Smoker    Spinal stenosis at L4-L5 level    Tobacco abuse    Tobacco use disorder    Past Surgical History:  Procedure Laterality Date   ANAL FISTULECTOMY  09/25/2011   AORTA - BILATERAL FEMORAL ARTERY BYPASS GRAFT N/A 10/03/2020   Procedure: AORTA BIFEMORAL BYPASS GRAFT USING A HEMASHIELD  GOLD BIFURCATED 14 X 7mm GRAFT AND INFERIOR MESENTERIC ARTERY REIMPLANTATION;  Surgeon: Nada Libman, MD;  Location: [facility] OR;  Service: Vascular;  Laterality: N/A;   AORTIC VALVE REPLACEMENT N/A 11/24/2019   Procedure: AORTIC VALVE REPLACEMENT (AVR) using INSPIRIS Resilia 23 MM Bioprosthetic Aortic Valve.;  Surgeon: Alleen Borne, MD;  Location: [facility] OR;  Service: Open Heart Surgery;  Laterality: N/A;   APPLICATION OF WOUND VAC Left 10/17/2020   Procedure: APPLICATION OF WOUND VAC;  Surgeon: Nada Libman, MD;  Location: [facility] OR;  Service: Vascular;  Laterality: Left;   BACK SURGERY     2015   CATARACT EXTRACTION, BILATERAL  2022   COLONOSCOPY  2009   MS-F/V-mov(exc)-HPP   CORONARY ARTERY BYPASS GRAFT N/A 11/24/2019   Procedure: CORONARY ARTERY BYPASS GRAFTING (CABG) using LIMA to LAD.;  Surgeon: Alleen Borne, MD;  Location: [facility];  Service: Open Heart Surgery;  Laterality: N/A;   ELBOW SURGERY     bilaterally for cubital tunnel   ENDARTERECTOMY N/A 10/03/2020   Procedure: AORTIC ENDARTERECTOMY;  Surgeon: Nada Libman, MD;  Location: [facility];  Service:  Vascular;  Laterality: N/A;   ENDARTERECTOMY FEMORAL Bilateral 10/03/2020   Procedure: BILATERAL FEMORAL ENDARTERECTOMY;  Surgeon: Nada Libman, MD;  Location: [facility];  Service: Vascular;  Laterality: Bilateral;   HERNIA REPAIR     with mesh   INCISION AND DRAINAGE Left 01/02/2021   Procedure: INCISION AND DRAINAGE LEFT GROIN WITH STIMULAN BEADS;  Surgeon: Nada Libman, MD;  Location: [facility];  Service: Vascular;  Laterality: Left;   INCISION AND DRAINAGE OF WOUND Left 10/17/2020   Procedure: EXPLORATION LEFT GROIN WOUND;  Surgeon: Nada Libman, MD;  Location: [facility];  Service: Vascular;  Laterality: Left;   MAXIMUM ACCESS (MAS)POSTERIOR LUMBAR INTERBODY FUSION (PLIF) 1 LEVEL N/A 10/25/2014   Procedure: LUMBAR FOUR TO FIVE MAXIMUM ACCESS (MAS) POSTERIOR LUMBAR INTERBODY FUSION (PLIF) 1 LEVEL;  Surgeon: Tia Alert, MD;   Location: [facility];  Service: Neurosurgery;  Laterality: N/A;   RIGHT/LEFT HEART CATH AND CORONARY ANGIOGRAPHY N/A 11/10/2019   Procedure: RIGHT/LEFT HEART CATH AND CORONARY ANGIOGRAPHY;  Surgeon: Swazil[place], [name], MD;  Location: [facility] INVASIVE CV LAB;  Service: Cardiovascular;  Laterality: N/A;   SKIN BIOPSY Left 10/12/2018   shave forehead Hypertrophic actinic kertosis with features of a verruca   TEE WITHOUT CARDIOVERSION N/A 11/24/2019   Procedure: TRANSESOPHAGEAL ECHOCARDIOGRAM (TEE);  Surgeon: Alleen Borne, MD;  Location: [facility] OR;  Service: Open Heart Surgery;  Laterality: N/A;   TONSILLECTOMY  age 83   VASECTOMY     X 2   VASECTOMY REVERSAL     Patient Active Problem List   Diagnosis Date Noted   Subarachnoid hemorrhage following injury (HCC) 08/26/2023   Skull fracture with cerebral contusion (HCC) 08/26/2023   Temporal bone fracture (HCC) 08/26/2023   Aortic atherosclerosis (HCC) 05/26/2023   Acute non-recurrent pansinusitis 01/04/2023   Perennial allergic rhinitis 07/09/2022   Asthma-COPD overlap syndrome (HCC) 04/21/2022   Recurrent infections 04/21/2022   S/P aortobifemoral bypass surgery 04/03/2021   S/P CABG x 1 11/24/2019   S/P aortic valve replacement with bioprosthetic valve 11/24/2019   PAD (peripheral artery disease) (HCC)    Mild persistent asthma 10/29/2019   RBBB (right bundle branch block with left anterior fascicular block)    Peripheral neuropathy    S/P lumbar spinal fusion 10/25/2014   Recovering alcoholic in remission (HCC) 08/10/2014   Substance induced mood disorder (HCC) 06/30/2014   Substance-induced sleep disorder (HCC) 06/30/2014   Obsessive compulsive disorder    OSA (obstructive sleep apnea) 01/20/2014   CAD (coronary artery disease)    Mentally disabled 11/25/2012   Hyperlipidemia with target LDL less than 130 11/25/2012   Bipolar disorder (HCC)    Hypertension    Allergic rhinitis due to pollen 08/11/2011    ONSET DATE: 08/25/23    REFERRING DIAG: O53.9X1D (ICD-10-CM) - Traumatic brain injury, with loss of consciousness of 30 minutes or less, subsequent encounter R41.3 (ICD-10-CM) - Memory deficit  THERAPY DIAG:  Cognitive communication deficit  Rationale for Evaluation and Treatment: Rehabilitation  SUBJECTIVE:   SUBJECTIVE STATEMENT: "He forgets his walker" Pt accompanied by: significant other Chales Abrahams  PERTINENT HISTORY: Pt reports long history of cardiac issues/dizziness, but has never lost consciousness. Pt reports he felt dizzy and was reaching up at an aisle in [employer]. Lost consciousness and fell backwards onto the concrete floor and his his head. Does not know how long he was unconscious, was found by another shopper. Has a history of bradycardia, wore a heart monitor for 2 weeks and  was cleared by his cardiologist in August to exercise prior to this incident. Works as a Investment banker, operational at [employer] and is used to weightlifting/cardio several times per week.   PAIN:  Are you having pain? No  FALLS: Has patient fallen in last 6 months?  See PT evaluation for details  LIVING ENVIRONMENT: Lives with: lives with their spouse Lives in: House/apartment  PLOF:  Level of assistance: Independent with ADLs, Independent with IADLs Employment: Part-time employment  PATIENT GOALS: "To go back to work and drive"  OBJECTIVE:  Note: Objective measures were completed at Evaluation unless otherwise noted.   COGNITION: Overall cognitive status: Impaired Areas of impairment:  Attention: Impaired: Alternating, Divided Awareness: Impaired: Anticipatory Executive function: Impaired: Impulse control, Planning, Error awareness, Self-correction, and Slow processing Functional deficits:   COGNITIVE COMMUNICATION: Following directions: Follows multi-step commands consistently  Auditory comprehension: WFL Verbal expression: WFL Functional communication: WFL  ORAL MOTOR EXAMINATION: Overall status: WFL Comments:    STANDARDIZED ASSESSMENTS: CLQT: Attention: WNL, Memory: WNL, Executive Function: WNL, Language: WNL, Visuospatial Skills: WNL, and Clock Drawing: Mild  PATIENT REPORTED OUTCOME MEASURES (PROM): Cognitive Function: Chales Abrahams given PROM, she did not fill it out, noted only "ability to do more than 1 thing at a time" has been affected   TODAY'S TREATMENT:                                                                                                                                         DATE:   09/30/23: Reviewed goals - eval only   PATIENT EDUCATION: Education details: See Today's Treatment, See Patient Instructions, compensations for cognition, accommodations in workplace Person educated: Patient and Spouse Education method: Explanation and Verbal cues Education comprehension: verbal cues required and needs further education   GOALS: Goals reviewed with patient? Yes  SHORT TERM GOALS: Target date: 10/28/23  Pt will carryover 2 strategies for attention to successfully carryover household tasks that require alternating attention with supervision cues.  Baseline: Goal status: INITIAL  2.  Pt will make safe decisions at home during household tasks with rare min A Baseline:  Goal status: INITIAL  3.  Pt will verbalize 3 compensations for attention when driving (for if/when he is cleared my MD to drive) Baseline:  Goal status: INITIAL  4. Pt will verbalize safety awareness in kitchen, garden, and work with rare min A             Goal status: INITIAL   LONG TERM GOALS: Target date: 11/25/23  Pt will carryover compensations for attention on divided attention tasks at home Baseline:  Goal status: INITIAL  2.  Pt will verbalize 3 strategies to support alternating and divided attention at work with rare min A Baseline:  Goal status: INITIAL  3.  Pt will verbalize 3 accommodations to support cognition and energy conservation upon return to work Baseline:  Goal status:  INITIAL  ASSESSMENT:  CLINICAL IMPRESSION: Patient is a 72 y.o. male who was seen today for cognition s/p TBI and fall. Today he presents with mild high level cognitive impairment. He is accompanied by his spouse, Chales Abrahams. She reports concern re: his safety awareness as he forgets or elects to not use his walker. He demonstrates poor safety awareness by showing how he can use hallway walls to keep from falling. She is also concerned that he has lifting and reaching precautions but has reached to top shelf in kitchen to retrieve heave mixing bowls. Cletis Athens again demonstrates reduced awareness by excusing these away. He hopes to return to work and short distance driving. Cletis Athens is not responsible for finances, he is working the remote, his phone, microwave etc successfully. He is not losing items any more frequently than prior to fall. I recommend short course of ST to maximize safety for possible return to work, household tasks and possible return to driving.   OBJECTIVE IMPAIRMENTS: include attention and awareness. These impairments are limiting patient from return to work and household responsibilities. Factors affecting potential to achieve goals and functional outcome are  n/a .[name] Patient will benefit from skilled SLP services to address above impairments and improve overall function.  REHAB POTENTIAL: Good  PLAN:  SLP FREQUENCY: 2x/week  SLP DURATION: 8 weeks  PLANNED INTERVENTIONS: Environmental controls, Cueing hierachy, Cognitive reorganization, Internal/external aids, Functional tasks, and Multimodal communication approach    Mykell Rawl, Radene Journey, CCC-SLP 09/30/2023, 1:49 PM

## 2023-09-30 NOTE — Telephone Encounter (Signed)
Patient is calling stating he is in physical therapy and was advised to call the office to confirm if he is able to do moderate exercise with the zio patch on.   Please advise.

## 2023-09-30 NOTE — Telephone Encounter (Signed)
Patient want to do some light weights and elliptical while wearing the Zio monitor.  Nothing too heavy or strenuos but light exercising.  Please advise if OK to do these

## 2023-09-30 NOTE — Therapy (Signed)
OUTPATIENT PHYSICAL THERAPY NEURO TREATMENT   Patient Name: Gregory [name] MRN: [id number] DOB:1951-08-04, 72 y.o., male Today's Date: 09/30/2023   PCP: Ronnald Nian, MD REFERRING PROVIDER: Willeen Niece, MD  END OF SESSION:  PT End of Session - 09/30/23 1022     Visit Number 8    Number of Visits 17    Date for PT Re-Evaluation 11/10/23    Authorization Type [employer]    PT Start Time 1021   Pt in restroom   PT Stop Time 1059    PT Time Calculation (min) 38 min    Equipment Utilized During Treatment --    Activity Tolerance Patient tolerated treatment well    Behavior During Therapy WFL for tasks assessed/performed             Past Medical History:  Diagnosis Date   Alcohol abuse    Aortic stenosis    Arthritis    Asthma    exercise indused or pollen-uses inhaler dail yand has rescue inhaler if needed   Bipolar disorder (HCC)    CAD (coronary artery disease)    coronary calcifications 08/2013 CTA   Carotid artery occlusion    Cataract    bilateral sx   Chronic back pain    Claudication (HCC)    Complication of anesthesia 10/16/2020   "had a panic attack when the mask was put on me"" I do not think I was given anything  before, I need something to calm me down"   Depression    Family history of skin cancer    Heart murmur    as an infant   Hepatitis    h/o 1970,doesn't remember which type,1990 epstain-barr   Herpes zoster without complication 09/05/2019   Hyperlipidemia    Hypertension    Neuromuscular disorder (HCC)    Obsessive compulsive disorder    PAD (peripheral artery disease) (HCC)    Peripheral neuropathy    Pneumonia    RBBB    RBBB (right bundle branch block with left anterior fascicular block)    Seasonal allergies    Severe aortic stenosis    Sleep apnea    does not wear CPAP   Smoker    Spinal stenosis at L4-L5 level    Tobacco abuse    Tobacco use disorder    Past Surgical History:  Procedure Laterality Date    ANAL FISTULECTOMY  09/25/2011   AORTA - BILATERAL FEMORAL ARTERY BYPASS GRAFT N/A 10/03/2020   Procedure: AORTA BIFEMORAL BYPASS GRAFT USING A HEMASHIELD GOLD BIFURCATED 14 X 7mm GRAFT AND INFERIOR MESENTERIC ARTERY REIMPLANTATION;  Surgeon: Nada Libman, MD;  Location: [facility] OR;  Service: Vascular;  Laterality: N/A;   AORTIC VALVE REPLACEMENT N/A 11/24/2019   Procedure: AORTIC VALVE REPLACEMENT (AVR) using INSPIRIS Resilia 23 MM Bioprosthetic Aortic Valve.;  Surgeon: Alleen Borne, MD;  Location: [facility] OR;  Service: Open Heart Surgery;  Laterality: N/A;   APPLICATION OF WOUND VAC Left 10/17/2020   Procedure: APPLICATION OF WOUND VAC;  Surgeon: Nada Libman, MD;  Location: [facility] OR;  Service: Vascular;  Laterality: Left;   BACK SURGERY     2015   CATARACT EXTRACTION, BILATERAL  2022   COLONOSCOPY  2009   MS-F/V-mov(exc)-HPP   CORONARY ARTERY BYPASS GRAFT N/A 11/24/2019   Procedure: CORONARY ARTERY BYPASS GRAFTING (CABG) using LIMA to LAD.;  Surgeon: Alleen Borne, MD;  Location: [facility] OR;  Service: Open Heart Surgery;  Laterality: N/A;   ELBOW SURGERY  bilaterally for cubital tunnel   ENDARTERECTOMY N/A 10/03/2020   Procedure: AORTIC ENDARTERECTOMY;  Surgeon: Nada Libman, MD;  Location: [facility];  Service: Vascular;  Laterality: N/A;   ENDARTERECTOMY FEMORAL Bilateral 10/03/2020   Procedure: BILATERAL FEMORAL ENDARTERECTOMY;  Surgeon: Nada Libman, MD;  Location: [facility];  Service: Vascular;  Laterality: Bilateral;   HERNIA REPAIR     with mesh   INCISION AND DRAINAGE Left 01/02/2021   Procedure: INCISION AND DRAINAGE LEFT GROIN WITH STIMULAN BEADS;  Surgeon: Nada Libman, MD;  Location: [facility];  Service: Vascular;  Laterality: Left;   INCISION AND DRAINAGE OF WOUND Left 10/17/2020   Procedure: EXPLORATION LEFT GROIN WOUND;  Surgeon: Nada Libman, MD;  Location: [facility];  Service: Vascular;  Laterality: Left;   MAXIMUM ACCESS (MAS)POSTERIOR LUMBAR INTERBODY FUSION (PLIF) 1  LEVEL N/A 10/25/2014   Procedure: LUMBAR FOUR TO FIVE MAXIMUM ACCESS (MAS) POSTERIOR LUMBAR INTERBODY FUSION (PLIF) 1 LEVEL;  Surgeon: Tia Alert, MD;  Location: [facility];  Service: Neurosurgery;  Laterality: N/A;   RIGHT/LEFT HEART CATH AND CORONARY ANGIOGRAPHY N/A 11/10/2019   Procedure: RIGHT/LEFT HEART CATH AND CORONARY ANGIOGRAPHY;  Surgeon: Swazil[place], [name], MD;  Location: [facility] INVASIVE CV LAB;  Service: Cardiovascular;  Laterality: N/A;   SKIN BIOPSY Left 10/12/2018   shave forehead Hypertrophic actinic kertosis with features of a verruca   TEE WITHOUT CARDIOVERSION N/A 11/24/2019   Procedure: TRANSESOPHAGEAL ECHOCARDIOGRAM (TEE);  Surgeon: Alleen Borne, MD;  Location: [facility] OR;  Service: Open Heart Surgery;  Laterality: N/A;   TONSILLECTOMY  age 24   VASECTOMY     X 2   VASECTOMY REVERSAL     Patient Active Problem List   Diagnosis Date Noted   Subarachnoid hemorrhage following injury (HCC) 08/26/2023   Skull fracture with cerebral contusion (HCC) 08/26/2023   Temporal bone fracture (HCC) 08/26/2023   Aortic atherosclerosis (HCC) 05/26/2023   Acute non-recurrent pansinusitis 01/04/2023   Perennial allergic rhinitis 07/09/2022   Asthma-COPD overlap syndrome (HCC) 04/21/2022   Recurrent infections 04/21/2022   S/P aortobifemoral bypass surgery 04/03/2021   S/P CABG x 1 11/24/2019   S/P aortic valve replacement with bioprosthetic valve 11/24/2019   PAD (peripheral artery disease) (HCC)    Mild persistent asthma 10/29/2019   RBBB (right bundle branch block with left anterior fascicular block)    Peripheral neuropathy    S/P lumbar spinal fusion 10/25/2014   Recovering alcoholic in remission (HCC) 08/10/2014   Substance induced mood disorder (HCC) 06/30/2014   Substance-induced sleep disorder (HCC) 06/30/2014   Obsessive compulsive disorder    OSA (obstructive sleep apnea) 01/20/2014   CAD (coronary artery disease)    Mentally disabled 11/25/2012   Hyperlipidemia with  target LDL less than 130 11/25/2012   Bipolar disorder (HCC)    Hypertension    Allergic rhinitis due to pollen 08/11/2011    ONSET DATE: 08/28/2023  REFERRING DIAG: R55 (ICD-10-CM) - Syncope, unspecified syncope type I60.9 (ICD-10-CM) - Subarachnoid hemorrhage (HCC)  THERAPY DIAG:  Unsteadiness on feet  Other abnormalities of gait and mobility  Dizziness and giddiness  Muscle weakness (generalized)  Rationale for Evaluation and Treatment: Rehabilitation  SUBJECTIVE:  SUBJECTIVE STATEMENT: HOH on L side due to temporal bone fx  Patient arrives to session with 2WW. Denies falls/near falls. Reports he is doing his exercises 3x/day. Still having difficulty w/Brock string.  Pt accompanied by:  Wife, Chales Abrahams   PERTINENT HISTORY: CABG, aortic valve replacement (2021)  PAIN:  Are you having pain? Yes: NPRS scale: 1/10 Pain location: Low back and neck Pain description: In low back, achy. In head, disorientation Aggravating factors: Movement  Relieving factors: Ice   PRECAUTIONS: Fall and Other: no bending forward (skull fx)   RED FLAGS: None   WEIGHT BEARING RESTRICTIONS: No  FALLS: Has patient fallen in last 6 months? Yes. Number of falls 2 (hit head both times)   LIVING ENVIRONMENT: Lives with: lives with their spouse Lives in: House/apartment Stairs: Yes: External: 4 steps; on left going up Has following equipment at home: Dan Humphreys - 2 wheeled and shower chair  PLOF: Independent  PATIENT GOALS: "To reduce my symptoms."  OBJECTIVE:   DIAGNOSTIC FINDINGS:   CT Head wo Contrast 09/08/2023 IMPRESSION: 1. Expected evolution of multicompartmental intracranial hemorrhage since 08/28/2023, with no residual hyperdense blood. 2. 3 mm thick hypodense right parietal convexity subdural  collection is unchanged and may reflect a subdural hygroma or evolving subdural hematoma. 3. Unchanged right temporal/parietal bone fracture.  CT Lumbar Spine wo Contrast 09/08/2023: IMPRESSION: 1. No acute fracture or traumatic malalignment of the lumbar spine. 2. Postsurgical changes reflecting posterior instrumented fusion and decompression at L5-S1 without residual spinal canal or neural foraminal stenosis. 3. Adjacent segment disease at L3-L4 resulting in at least moderate spinal canal stenosis and moderate bilateral neural foraminal stenosis.  CT of head from 08/27/23   IMPRESSION: 1. No significant interval change in bilateral frontal convexity subdural fluid collection measuring up to 6 mm. 2. Redemonstrated subarachnoid hemorrhage and hemorrhagic contusions along the bilateral anterior frontal lobes, slightly increased on the left measuring up 2.1 cm ( previously 1.9 cm), and unchanged along the right frontal lobe. Compared to prior exam there is interval increase in conspicuity of subarachnoid blood products along the bilateral parietal lobes, likely due to redistribution. No evidence of intraventricular extension. No hydrocephalus. 3. Redemonstrated acute nondisplaced fracture involving the right temporal bone.  COGNITION: Overall cognitive status: Impaired   SENSATION: WFL  POSTURE: rounded shoulders and forward head  BED MOBILITY:  Independent per pt  TRANSFERS: Assistive device utilized: Environmental consultant - 2 wheeled  Sit to stand: SBA Stand to sit: SBA   GAIT: Gait pattern: step through pattern, decreased stride length, antalgic, lateral hip instability, decreased trunk rotation, and trunk flexed Distance walked: Various clinic distances  Assistive device utilized: Walker - 2 wheeled Level of assistance: CGA Comments: Pt very rigid and antalgic. No LOB noted but does require min cues to stay inside RW   FUNCTIONAL TESTS:  5x STS and MCTSIB to be assessed     VITALS  Vitals:   09/30/23 1051  BP: 139/60  Pulse: (!) 45   Asymptomatic for LBP  TODAY'S TREATMENT:           Ther Act  Reviewed proper technique to perform Brock string exercise, as wife reports pt will not keep string centered or listen to her when she attempts to correct him  Pt reports he saw the cardiologist and is being worked up to get a pacemaker. Informed pt to keep therapy updated as we may need to pause PT.  Pt and wife inquiring about whether he can return to the gym. Recommend  pt obtain Pulse Ox and obtain clearance from cardiologist prior to returning. Educated pt on importance of starting SLOW at the gym (warming up on seated bike and doing 2-4 machines). Pt states he is used to doing all the upper body machines. Reminded pt that he has not been to the gym in months and will need to slowly build his endurance and activity tolerance. Pt verbalized understanding.  Informed pt that he continues to need to use the RW at all times.   PATIENT EDUCATION: Education details: See above  Person educated: Patient and Spouse Education method: Medical illustrator Education comprehension: verbalized understanding and needs further education  HOME EXERCISE PROGRAM:  Access Code: [email or web address] URL: [email or web address] Date: 09/08/2023 Prepared by: Maryruth Eve  Exercises - Corner Balance Feet Together With Eyes Closed  - 1 x daily - 7 x weekly - 3 sets - 30 seconds hold - Pencil Pushups  - 1 x daily - 7 x weekly - 3 sets - 10 reps - Seated Horizontal Saccades  - 1 x daily - 7 x weekly - 3 sets - 10 reps - Seated Vertical Saccades  - 1 x daily - 7 x weekly - 3 sets - 10 reps -[place] eye tracking (1 paragraph)  -King Devick's Saccades 1x a day   Education on SPOT it game and HART chart use, VOR x 1 viewing 3 x 5 rounds Brock string ~3x a day with spouse guiding through   GOALS: Goals reviewed with patient? Yes  SHORT TERM GOALS: Target date:  09/29/2023   Pt will be independent with initial HEP for improved balance, transfers and gait.  Baseline: not established on eval  Goal status: INITIAL  2.  Patient will improve RPQ-3 score to </= 2 per item to indicate reduced severity of post-concussive symptoms to progress towards PLOF.    Baseline: 3/4 Goal status: INITIAL  3.  5x STS to be assessed and STG/LTG updated  Baseline: 15.65 seconds without UE use (SBA) Goal status: Discontinued STG due to how well performed   4.  Patient will improve mCTSIB score to 100/120 to indicate improved integration of vestibular, proprioceptive, and visual balance systems during static balance tasks in order to reduce risk for falls.   Baseline: 86/120 Goal status: INITIAL   LONG TERM GOALS: Target date: 10/27/2023   Pt will be independent with final HEP for improved balance, transfers and gait.  Baseline:  Goal status: INITIAL  2.  Patient will improve RPQ-13 score to </= 2 per item to indicate reduced severity of post-concussive symptoms to progress towards PLOF.   Baseline: greatest deficit reported 4/4  Goal status: INITIAL  3.  Patient will improve their 5x Sit to Stand score to less than 13 seconds to demonstrate a decreased risk for falls and improved LE strength.   Baseline: 15.65 seconds without UE use (SBA) Goal status: INITIAL  4. Patient will improve mCTSIB score to 120/120 to indicate improved integration of vestibular, proprioceptive, and visual balance systems during static balance tasks in order to reduce risk for falls.   Baseline: 86/120 Goal status: INITIAL   ASSESSMENT:  CLINICAL IMPRESSION: Emphasis of skilled PT session on pt education regarding proper technique for HEP and safety at home. Pt reports he saw cardiology and may need a pacemaker. Pt inquiring about returning to the gym, encouraged him to obtain clearance from cardiologist and purchase pulse ox so he can closely monitor his HR, as his watch is not  as accurate. Continue POC.  OBJECTIVE IMPAIRMENTS: Abnormal gait, decreased activity tolerance, decreased balance, decreased cognition, decreased coordination, decreased endurance, decreased knowledge of condition, decreased knowledge of use of DME, decreased mobility, difficulty walking, decreased strength, decreased safety awareness, dizziness, impaired perceived functional ability, impaired vision/preception, and pain  ACTIVITY LIMITATIONS: carrying, lifting, bending, sleeping, stairs, transfers, hygiene/grooming, locomotion level, and caring for others  PARTICIPATION LIMITATIONS: meal prep, cleaning, laundry, interpersonal relationship, driving, shopping, community activity, occupation, and yard work  PERSONAL FACTORS: Fitness, Past/current experiences, and 3+ comorbidities: Bradycardia, post-concussion w/LOC,   are also affecting patient's functional outcome.   REHAB POTENTIAL: Good  CLINICAL DECISION MAKING: Unstable/unpredictable  EVALUATION COMPLEXITY: High  PLAN:  PT FREQUENCY: 2x/week  PT DURATION: 8 weeks  PLANNED INTERVENTIONS: Therapeutic exercises, Therapeutic activity, Neuromuscular re-education, Balance training, Gait training, Patient/Family education, Self Care, Joint mobilization, Stair training, Vestibular training, Canalith repositioning, DME instructions, Aquatic Therapy, Dry Needling, Electrical stimulation, Manual therapy, and Re-evaluation  PLAN FOR NEXT SESSION: progress occulomotor/vestibular exercises (VORx1 as tolerated, Brock string), progress corner balance (EC on foam, tandem with head turns as able etc), consider neuropthomology referral pending progress; did OT/speech get scheduled, HART chart with balance work, work on Patent attorney scanning with balance training or dynamic gait/balance without AD with visual scanning, midline orientation, assess goals  Tyannah Sane E Josslin Sanjuan, PT, DPT 09/30/2023, 11:38 AM

## 2023-10-01 ENCOUNTER — Ambulatory Visit: Payer: PPO

## 2023-10-01 ENCOUNTER — Other Ambulatory Visit (HOSPITAL_COMMUNITY): Payer: Self-pay | Admitting: Neurological Surgery

## 2023-10-01 ENCOUNTER — Encounter (INDEPENDENT_AMBULATORY_CARE_PROVIDER_SITE_OTHER): Payer: PPO | Admitting: Ophthalmology

## 2023-10-01 DIAGNOSIS — S069X1A Unspecified intracranial injury with loss of consciousness of 30 minutes or less, initial encounter: Secondary | ICD-10-CM

## 2023-10-01 DIAGNOSIS — R001 Bradycardia, unspecified: Secondary | ICD-10-CM | POA: Diagnosis not present

## 2023-10-01 DIAGNOSIS — R42 Dizziness and giddiness: Secondary | ICD-10-CM | POA: Diagnosis not present

## 2023-10-01 NOTE — Telephone Encounter (Signed)
Advised patient of information.

## 2023-10-02 ENCOUNTER — Ambulatory Visit: Payer: PPO | Admitting: Occupational Therapy

## 2023-10-02 ENCOUNTER — Ambulatory Visit: Payer: PPO

## 2023-10-02 DIAGNOSIS — R2681 Unsteadiness on feet: Secondary | ICD-10-CM

## 2023-10-02 DIAGNOSIS — R42 Dizziness and giddiness: Secondary | ICD-10-CM

## 2023-10-02 DIAGNOSIS — I442 Atrioventricular block, complete: Secondary | ICD-10-CM | POA: Diagnosis not present

## 2023-10-02 DIAGNOSIS — I69018 Other symptoms and signs involving cognitive functions following nontraumatic subarachnoid hemorrhage: Secondary | ICD-10-CM

## 2023-10-02 DIAGNOSIS — M6281 Muscle weakness (generalized): Secondary | ICD-10-CM

## 2023-10-02 DIAGNOSIS — R2689 Other abnormalities of gait and mobility: Secondary | ICD-10-CM

## 2023-10-02 DIAGNOSIS — R41842 Visuospatial deficit: Secondary | ICD-10-CM

## 2023-10-02 NOTE — Therapy (Signed)
OUTPATIENT OCCUPATIONAL THERAPY TREATMENT  Patient Name: Gregory [name] MRN: [id number] DOB:October 06, 1951, 72 y.o., male Today's Date: 10/02/2023  PCP: Ronnald Nian, MD REFERRING PROVIDER: Ronnald Nian, MD  END OF SESSION:  OT End of Session - 10/02/23 1316     Visit Number 2    Number of Visits 13    Date for OT Re-Evaluation 11/13/23    Authorization Type [employer]    Progress Note Due on Visit 10    OT Start Time 1317    OT Stop Time 1400    OT Time Calculation (min) 43 min    Activity Tolerance Patient tolerated treatment well    Behavior During Therapy WFL for tasks assessed/performed              Past Medical History:  Diagnosis Date   Alcohol abuse    Aortic stenosis    Arthritis    Asthma    exercise indused or pollen-uses inhaler dail yand has rescue inhaler if needed   Bipolar disorder (HCC)    CAD (coronary artery disease)    coronary calcifications 08/2013 CTA   Carotid artery occlusion    Cataract    bilateral sx   Chronic back pain    Claudication (HCC)    Complication of anesthesia 10/16/2020   "had a panic attack when the mask was put on me"" I do not think I was given anything  before, I need something to calm me down"   Depression    Family history of skin cancer    Heart murmur    as an infant   Hepatitis    h/o 1970,doesn't remember which type,1990 epstain-barr   Herpes zoster without complication 09/05/2019   Hyperlipidemia    Hypertension    Neuromuscular disorder (HCC)    Obsessive compulsive disorder    PAD (peripheral artery disease) (HCC)    Peripheral neuropathy    Pneumonia    RBBB    RBBB (right bundle branch block with left anterior fascicular block)    Seasonal allergies    Severe aortic stenosis    Sleep apnea    does not wear CPAP   Smoker    Spinal stenosis at L4-L5 level    Tobacco abuse    Tobacco use disorder    Past Surgical History:  Procedure Laterality Date   ANAL FISTULECTOMY  09/25/2011    AORTA - BILATERAL FEMORAL ARTERY BYPASS GRAFT N/A 10/03/2020   Procedure: AORTA BIFEMORAL BYPASS GRAFT USING A HEMASHIELD GOLD BIFURCATED 14 X 7mm GRAFT AND INFERIOR MESENTERIC ARTERY REIMPLANTATION;  Surgeon: Nada Libman, MD;  Location: [facility] OR;  Service: Vascular;  Laterality: N/A;   AORTIC VALVE REPLACEMENT N/A 11/24/2019   Procedure: AORTIC VALVE REPLACEMENT (AVR) using INSPIRIS Resilia 23 MM Bioprosthetic Aortic Valve.;  Surgeon: Alleen Borne, MD;  Location: [facility] OR;  Service: Open Heart Surgery;  Laterality: N/A;   APPLICATION OF WOUND VAC Left 10/17/2020   Procedure: APPLICATION OF WOUND VAC;  Surgeon: Nada Libman, MD;  Location: [facility] OR;  Service: Vascular;  Laterality: Left;   BACK SURGERY     2015   CATARACT EXTRACTION, BILATERAL  2022   COLONOSCOPY  2009   MS-F/V-mov(exc)-HPP   CORONARY ARTERY BYPASS GRAFT N/A 11/24/2019   Procedure: CORONARY ARTERY BYPASS GRAFTING (CABG) using LIMA to LAD.;  Surgeon: Alleen Borne, MD;  Location: [facility] OR;  Service: Open Heart Surgery;  Laterality: N/A;   ELBOW SURGERY     bilaterally  for cubital tunnel   ENDARTERECTOMY N/A 10/03/2020   Procedure: AORTIC ENDARTERECTOMY;  Surgeon: Nada Libman, MD;  Location: [facility] OR;  Service: Vascular;  Laterality: N/A;   ENDARTERECTOMY FEMORAL Bilateral 10/03/2020   Procedure: BILATERAL FEMORAL ENDARTERECTOMY;  Surgeon: Nada Libman, MD;  Location: [facility] OR;  Service: Vascular;  Laterality: Bilateral;   HERNIA REPAIR     with mesh   INCISION AND DRAINAGE Left 01/02/2021   Procedure: INCISION AND DRAINAGE LEFT GROIN WITH STIMULAN BEADS;  Surgeon: Nada Libman, MD;  Location: [facility] OR;  Service: Vascular;  Laterality: Left;   INCISION AND DRAINAGE OF WOUND Left 10/17/2020   Procedure: EXPLORATION LEFT GROIN WOUND;  Surgeon: Nada Libman, MD;  Location: [facility] OR;  Service: Vascular;  Laterality: Left;   MAXIMUM ACCESS (MAS)POSTERIOR LUMBAR INTERBODY FUSION (PLIF) 1 LEVEL N/A 10/25/2014   Procedure:  LUMBAR FOUR TO FIVE MAXIMUM ACCESS (MAS) POSTERIOR LUMBAR INTERBODY FUSION (PLIF) 1 LEVEL;  Surgeon: Tia Alert, MD;  Location: [facility];  Service: Neurosurgery;  Laterality: N/A;   RIGHT/LEFT HEART CATH AND CORONARY ANGIOGRAPHY N/A 11/10/2019   Procedure: RIGHT/LEFT HEART CATH AND CORONARY ANGIOGRAPHY;  Surgeon: Swaziland, [name], MD;  Location: [facility] INVASIVE CV LAB;  Service: Cardiovascular;  Laterality: N/A;   SKIN BIOPSY Left 10/12/2018   shave forehead Hypertrophic actinic kertosis with features of a verruca   TEE WITHOUT CARDIOVERSION N/A 11/24/2019   Procedure: TRANSESOPHAGEAL ECHOCARDIOGRAM (TEE);  Surgeon: Alleen Borne, MD;  Location: [facility] OR;  Service: Open Heart Surgery;  Laterality: N/A;   TONSILLECTOMY  age 55   VASECTOMY     X 2   VASECTOMY REVERSAL     Patient Active Problem List   Diagnosis Date Noted   Subarachnoid hemorrhage following injury (HCC) 08/26/2023   Skull fracture with cerebral contusion (HCC) 08/26/2023   Temporal bone fracture (HCC) 08/26/2023   Aortic atherosclerosis (HCC) 05/26/2023   Acute non-recurrent pansinusitis 01/04/2023   Perennial allergic rhinitis 07/09/2022   Asthma-COPD overlap syndrome (HCC) 04/21/2022   Recurrent infections 04/21/2022   S/P aortobifemoral bypass surgery 04/03/2021   S/P CABG x 1 11/24/2019   S/P aortic valve replacement with bioprosthetic valve 11/24/2019   PAD (peripheral artery disease) (HCC)    Mild persistent asthma 10/29/2019   RBBB (right bundle branch block with left anterior fascicular block)    Peripheral neuropathy    S/P lumbar spinal fusion 10/25/2014   Recovering alcoholic in remission (HCC) 08/10/2014   Substance induced mood disorder (HCC) 06/30/2014   Substance-induced sleep disorder (HCC) 06/30/2014   Obsessive compulsive disorder    OSA (obstructive sleep apnea) 01/20/2014   CAD (coronary artery disease)    Mentally disabled 11/25/2012   Hyperlipidemia with target LDL less than 130 11/25/2012    Bipolar disorder (HCC)    Hypertension    Allergic rhinitis due to pollen 08/11/2011    ONSET DATE: 09/15/2023 (referral date)   REFERRING DIAG: S06.9X1D (ICD-10-CM) - Traumatic brain injury, with loss of consciousness of 30 minutes or less, subsequent encounter R41.3 (ICD-10-CM) - Memory deficit  THERAPY DIAG:  Unsteadiness on feet  Muscle weakness (generalized)  Visuospatial deficit  Other symptoms and signs involving cognitive functions following nontraumatic subarachnoid hemorrhage  Rationale for Evaluation and Treatment: Rehabilitation  SUBJECTIVE:   SUBJECTIVE STATEMENT: Pt reports having heart monitor currently for 2 days; pt provided with heart monitor on Wed. afternoon 09/30/23. Pt reports he has a 14 day monitor and pt intends to f/u with physician about  potential Pacemaker. Pt reports no changes in medication and no recent falls.   Pt accompanied by:  Spouse  PERTINENT HISTORY: presenting after syncopal event and fall on 08/25/23. Imaging showed acute subarachnoid hemorrhage with 1.8 cm inferior left frontal lobe hemorrhagic contusion without substantial mass effect and nondisplaced fracture of the right temporoparietal calvarium. PMH: CABG, aortic valve replacement, HTN, HLD, bipolar, bradycardia  PRECAUTIONS: Other: no driving, fall risk, syncope, possible heart monitor coming tomorrow d/t bradycardia  WEIGHT BEARING RESTRICTIONS: No  PAIN:  Are you having pain?  Slight Right headache 2/10 and upper back 1/10.- O.T. not addressing directly. Pt reports managing pain with Tylenol medication.     FALLS: Has patient fallen in last 6 months? Yes. 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  HAND DOMINANCE: Left  ADLs: Eating: assist to cut food occasionally Grooming: independent UB Dressing: independent LB Dressing: independent Toileting: independent (raised) Bathing: mod I - standing some, seated some Tub Shower transfers: mod I  Equipment: Shower seat with back  IADLs: Shopping: pt goes with wife (pt used to do) Light housekeeping: wife always does majority, but pt has not returned to full laundry tasks Meal Prep: wife doing now for safety [name], but both pt and wife took turns prior to injury Community mobility: pt currently not driving since injury Medication management: independent Financial management: wife has always done  Handwriting:  pt reports always been horrible - no changes.   MOBILITY STATUS:  uses walker   ACTIVITY TOLERANCE: Activity tolerance: fatigues quicker   UPPER EXTREMITY ROM:  BUE AROM WFL's - pt has flexed ring and small fingers Lt hand from old injury but can fully open for short amounts of time   UPPER EXTREMITY MMT:   RUE 5/5 grossly, LUE 4+/5 grossly at shoulders   HAND FUNCTION: Grip strength: Right: 57.5 lbs; Left: 61.9 lbs  COORDINATION: 9 Hole Peg test: Right: 36.06 sec; Left: 35.92 sec (pt denies change and reports coordination bilaterally has been slightly slower for some time)   SENSATION: WFL Per pt report  EDEMA: none   COGNITION: Overall cognitive status:  difficult to assess, ? Safety awareness and executive functioning - see speech evaluation for details  VISION: Subjective report: not sure about visual changes, denies diplopia but wife reports issues with saccades and P.T. gave ex's for this and convergence Baseline vision: Wears glasses for reading only Visual history: corrective eye surgery for cataracts  VISION ASSESSMENT: TBA - d/t time constraints during evaluation  10/02/23 update - Pt  accurately read clock on wall. Pt wears reading glasses, pt reports hx of cataract surgery, and pt reports need to update prescription d/t difficulty seeing objects far away. Pt and pt's spouse reported changes with vision throughout the 3 weeks after the fall event, such as paint chips on wall appearing as "floating" and moving through pt's vision. However, pt reports "floaters" have mostly resolved and has not noticed floaters recently.  Ocular movements when tracking - Mild atypical movements of both eyes while tracking a marker in all directions and more significant "jumping" of eye movements intermittently. Pt's spouse reports eye movements "saccades" are greatly improved  compared to immediately after the fall.  Peripheral vision approx. 50* on both R and L. Superior vision WFL.  Letter cancellation - Pt correctly identified 100% of letters in 4 out of 4 lines for identifying letters uppercase B, D, E, and F.    PERCEPTION: Not tested  PRAXIS: Not tested  OBSERVATIONS: very verbose, occasional redirection needed, ? Safety awareness.    TODAY'S TREATMENT:                                                                                                                               Assessed pt's vision today, see objective section above for details. Additional LTG added to address visual deficits.  Pt continues to use walker for ambulation. Pt's and pt's spouse report pt sometimes using walker incorrectly by carrying walker or walking around the home without walker. OT educated pt on importance of using walker as recommended by physician and PT. Pt verbalized understanding though continuing education recommended.  OT educated pt and pt's spouse regarding "lighthouse" and other visual scanning strategies, vision ROM and peripheral vision, incorporation of visual scanning strategies in daily tasks, and strategies for household/community scanning activities. Pt and pt's spouse verbalized  understanding though continuing education and practice of incorporating visual strategies in daily routine recommended. Handouts provided (see pt instructions)  OT initiated Vision HEP and provided handout (see pt instructions) to improve visual scanning and BUE endurance. Pt demo'd understanding by completing x5 to 10 reps of visual scanning up/down and left/right with both eyes. However, pt demo'd some difficulty with task d/t lagging eye movements and therefore greatly benefited from intermittent v/c to focus on tip of marker. OT noted pt demo'd some shaking movements of BUE when moving marker left/right and up/down potentially d/t decreased UE endurance.    PATIENT EDUCATION: Education details: See Today's Treatment above Person educated: Patient and Spouse Education method: Explanation, Demonstration, Verbal cues, and Handouts Education comprehension: verbalized understanding, returned demonstration, verbal cues required, and needs further education  HOME EXERCISE PROGRAM: N/A   GOALS: Goals reviewed with patient? Yes  SHORT TERM GOALS: Target date: 10/21/23  Pt to be independent with HEP for overall strength/endurance UEs Baseline: Goal status: INITIAL  2.  Pt to return to standing for minimum of 10 minutes to perform light IADL's (folding clothes, washing dishes)  Baseline:  Goal status: INITIAL  3.  Pt to verbalize understanding with safety considerations for fall prevention during IADLS including appropriate use of walker for kitchen negotiation Baseline:  Goal status: INITIAL    LONG TERM GOALS: Target date: 11/13/23  Pt to perform environmental scanning in moderately distracting gym with 85% accuracy or greater in prep for potential return to driving Baseline:  Goal status: INITIAL  2.  Pt to demo safety w/ simple cooking task with direct supervision but no physical assist Baseline:  Goal status: INITIAL  3.  10/02/23 Update following additional vision  assessment -  Patient will demonstrate  at least 85% accuracy with environmental visual scanning to assist with ADLs and IADLS including potential driving considerations.   Baseline:  Goal status: INITIAL   ASSESSMENT:  CLINICAL IMPRESSION: Pt tolerated tasks well today. OT further assessed pt's vision today. OT noted pt demo'd atypical eye movements with visual tracking, visual deficits in peripheral vision, and potential decreased safety awareness. D/t visual deficits, pt benefits from v/c to improve tracking of objects in field of vision. Pt would benefit from continuing education regarding safety and visual scanning strategies to promote increased ind and safety at home and in community. Pt would benefit from skilled OT services in the outpatient setting to work on impairments as noted below to help pt return to PLOF as able.     PERFORMANCE DEFICITS: in functional skills including IADLs, strength, Fine motor control, mobility, balance, endurance, cardiopulmonary status limiting function, decreased knowledge of precautions, decreased knowledge of use of DME, vision, and UE functional use, cognitive skills including problem solving and safety awareness, and psychosocial skills including coping strategies.   IMPAIRMENTS: are limiting patient from IADLs, work, and leisure.   CO-MORBIDITIES: may have co-morbidities  that affects occupational performance. Patient will benefit from skilled OT to address above impairments and improve overall function.  MODIFICATION OR ASSISTANCE TO COMPLETE EVALUATION: No modification of tasks or assist necessary to complete an evaluation.  OT OCCUPATIONAL PROFILE AND HISTORY: Problem focused assessment: Including review of records relating to presenting problem.  CLINICAL DECISION MAKING: Moderate - several treatment options, min-mod task modification necessary  REHAB POTENTIAL: Good  EVALUATION COMPLEXITY: Low    PLAN:  OT FREQUENCY: 2x/week  OT  DURATION: 6 weeks, plus evaluation  PLANNED INTERVENTIONS: self care/ADL training, therapeutic exercise, therapeutic activity, neuromuscular re-education, manual therapy, passive range of motion, functional mobility training, splinting, patient/family education, cognitive remediation/compensation, visual/perceptual remediation/compensation, coping strategies training, DME and/or AE instructions, and Re-evaluation  RECOMMENDED OTHER SERVICES: none at this time  CONSULTED AND AGREED WITH PLAN OF CARE: Patient and spouse  PLAN FOR NEXT SESSION:   Work on endurance, UE strengthening, standing tolerance Continue to review visual scanning strategies and safety awareness   Wynetta Emery, OT 10/02/2023, 2:47 PM

## 2023-10-02 NOTE — Patient Instructions (Signed)
1. Look for the edge of objects (to the left and/or right) so that you make sure you are seeing all of an object 2. Turn your head when walking, scan from side to side, particularly in busy environments 3. Use an organized scanning pattern. It's usually easier to scan from top to bottom, and left to right (like you are reading) 4. Double check yourself 5. Use a line guide (like a blank piece of paper) or your finger when reading 6. If necessary, place brightly colored tape at end of table or work area as a reminder to always look until you see the tape.  Activities to try at home to encourage visual scanning:   1. Word searches 2. Mazes 3. Puzzles 4. Card games 5. Computer games and/or searches 6. Connect-the-dots  Activities for environmental (larger) scanning:  1. With supervision, scan for items in grocery store or drugstore.  Begin with a familiar store, then progress to a new store you've never been in before. Make sure you have supervision with this.  2. With supervision, tell a family member or caregiver when it is safe to cross a street after looking all directions and any side streets. However, do NOT cross street unless family member or caregiver is with you and says it is OK    

## 2023-10-02 NOTE — Therapy (Signed)
OUTPATIENT PHYSICAL THERAPY NEURO TREATMENT   Patient Name: JASER FULLEN MRN: [id number] DOB:1951/08/01, 72 y.o., male Today's Date: 10/02/2023   PCP: Ronnald Nian, MD REFERRING PROVIDER: Willeen Niece, MD  END OF SESSION:  PT End of Session - 10/02/23 1403     Visit Number 9    Number of Visits 17    Date for PT Re-Evaluation 11/10/23    Authorization Type [employer]    PT Start Time 1403   received from OT   PT Stop Time 1445    PT Time Calculation (min) 42 min    Equipment Utilized During Treatment Gait belt    Activity Tolerance Patient tolerated treatment well    Behavior During Therapy [facility] for tasks assessed/performed;Anxious             Past Medical History:  Diagnosis Date   Alcohol abuse    Aortic stenosis    Arthritis    Asthma    exercise indused or pollen-uses inhaler dail yand has rescue inhaler if needed   Bipolar disorder (HCC)    CAD (coronary artery disease)    coronary calcifications 08/2013 CTA   Carotid artery occlusion    Cataract    bilateral sx   Chronic back pain    Claudication (HCC)    Complication of anesthesia 10/16/2020   "had a panic attack when the mask was put on me"" I do not think I was given anything  before, I need something to calm me down"   Depression    Family history of skin cancer    Heart murmur    as an infant   Hepatitis    h/o 1970,doesn't remember which type,1990 epstain-barr   Herpes zoster without complication 09/05/2019   Hyperlipidemia    Hypertension    Neuromuscular disorder (HCC)    Obsessive compulsive disorder    PAD (peripheral artery disease) (HCC)    Peripheral neuropathy    Pneumonia    RBBB    RBBB (right bundle branch block with left anterior fascicular block)    Seasonal allergies    Severe aortic stenosis    Sleep apnea    does not wear CPAP   Smoker    Spinal stenosis at L4-L5 level    Tobacco abuse    Tobacco use disorder    Past Surgical History:  Procedure  Laterality Date   ANAL FISTULECTOMY  09/25/2011   AORTA - BILATERAL FEMORAL ARTERY BYPASS GRAFT N/A 10/03/2020   Procedure: AORTA BIFEMORAL BYPASS GRAFT USING A HEMASHIELD GOLD BIFURCATED 14 X 7mm GRAFT AND INFERIOR MESENTERIC ARTERY REIMPLANTATION;  Surgeon: Nada Libman, MD;  Location: [facility] OR;  Service: Vascular;  Laterality: N/A;   AORTIC VALVE REPLACEMENT N/A 11/24/2019   Procedure: AORTIC VALVE REPLACEMENT (AVR) using INSPIRIS Resilia 23 MM Bioprosthetic Aortic Valve.;  Surgeon: Alleen Borne, MD;  Location: [facility] OR;  Service: Open Heart Surgery;  Laterality: N/A;   APPLICATION OF WOUND VAC Left 10/17/2020   Procedure: APPLICATION OF WOUND VAC;  Surgeon: Nada Libman, MD;  Location: [facility] OR;  Service: Vascular;  Laterality: Left;   BACK SURGERY     2015   CATARACT EXTRACTION, BILATERAL  2022   COLONOSCOPY  2009   MS-F/V-mov(exc)-HPP   CORONARY ARTERY BYPASS GRAFT N/A 11/24/2019   Procedure: CORONARY ARTERY BYPASS GRAFTING (CABG) using LIMA to LAD.;  Surgeon: Alleen Borne, MD;  Location: [facility] OR;  Service: Open Heart Surgery;  Laterality: N/A;   ELBOW SURGERY  bilaterally for cubital tunnel   ENDARTERECTOMY N/A 10/03/2020   Procedure: AORTIC ENDARTERECTOMY;  Surgeon: Nada Libman, MD;  Location: [facility] OR;  Service: Vascular;  Laterality: N/A;   ENDARTERECTOMY FEMORAL Bilateral 10/03/2020   Procedure: BILATERAL FEMORAL ENDARTERECTOMY;  Surgeon: Nada Libman, MD;  Location: [facility] OR;  Service: Vascular;  Laterality: Bilateral;   HERNIA REPAIR     with mesh   INCISION AND DRAINAGE Left 01/02/2021   Procedure: INCISION AND DRAINAGE LEFT GROIN WITH STIMULAN BEADS;  Surgeon: Nada Libman, MD;  Location: [facility] OR;  Service: Vascular;  Laterality: Left;   INCISION AND DRAINAGE OF WOUND Left 10/17/2020   Procedure: EXPLORATION LEFT GROIN WOUND;  Surgeon: Nada Libman, MD;  Location: [facility];  Service: Vascular;  Laterality: Left;   MAXIMUM ACCESS (MAS)POSTERIOR LUMBAR INTERBODY  FUSION (PLIF) 1 LEVEL N/A 10/25/2014   Procedure: LUMBAR FOUR TO FIVE MAXIMUM ACCESS (MAS) POSTERIOR LUMBAR INTERBODY FUSION (PLIF) 1 LEVEL;  Surgeon: Tia Alert, MD;  Location: [facility] NEURO ORS;  Service: Neurosurgery;  Laterality: N/A;   RIGHT/LEFT HEART CATH AND CORONARY ANGIOGRAPHY N/A 11/10/2019   Procedure: RIGHT/LEFT HEART CATH AND CORONARY ANGIOGRAPHY;  Surgeon: Swaziland, [name], MD;  Location: [facility] INVASIVE CV LAB;  Service: Cardiovascular;  Laterality: N/A;   SKIN BIOPSY Left 10/12/2018   shave forehead Hypertrophic actinic kertosis with features of a verruca   TEE WITHOUT CARDIOVERSION N/A 11/24/2019   Procedure: TRANSESOPHAGEAL ECHOCARDIOGRAM (TEE);  Surgeon: Alleen Borne, MD;  Location: [facility] OR;  Service: Open Heart Surgery;  Laterality: N/A;   TONSILLECTOMY  age 68   VASECTOMY     X 2   VASECTOMY REVERSAL     Patient Active Problem List   Diagnosis Date Noted   Subarachnoid hemorrhage following injury (HCC) 08/26/2023   Skull fracture with cerebral contusion (HCC) 08/26/2023   Temporal bone fracture (HCC) 08/26/2023   Aortic atherosclerosis (HCC) 05/26/2023   Acute non-recurrent pansinusitis 01/04/2023   Perennial allergic rhinitis 07/09/2022   Asthma-COPD overlap syndrome (HCC) 04/21/2022   Recurrent infections 04/21/2022   S/P aortobifemoral bypass surgery 04/03/2021   S/P CABG x 1 11/24/2019   S/P aortic valve replacement with bioprosthetic valve 11/24/2019   PAD (peripheral artery disease) (HCC)    Mild persistent asthma 10/29/2019   RBBB (right bundle branch block with left anterior fascicular block)    Peripheral neuropathy    S/P lumbar spinal fusion 10/25/2014   Recovering alcoholic in remission (HCC) 08/10/2014   Substance induced mood disorder (HCC) 06/30/2014   Substance-induced sleep disorder (HCC) 06/30/2014   Obsessive compulsive disorder    OSA (obstructive sleep apnea) 01/20/2014   CAD (coronary artery disease)    Mentally disabled 11/25/2012    Hyperlipidemia with target LDL less than 130 11/25/2012   Bipolar disorder (HCC)    Hypertension    Allergic rhinitis due to pollen 08/11/2011    ONSET DATE: 08/28/2023  REFERRING DIAG: R55 (ICD-10-CM) - Syncope, unspecified syncope type I60.9 (ICD-10-CM) - Subarachnoid hemorrhage (HCC)  THERAPY DIAG:  Unsteadiness on feet  Muscle weakness (generalized)  Other abnormalities of gait and mobility  Dizziness and giddiness  Rationale for Evaluation and Treatment: Rehabilitation  SUBJECTIVE:  SUBJECTIVE STATEMENT: HOH on L side due to temporal bone fx Patient arrives with wife, using RW. Reports exercises are going better. Per patient, other therapist (speech?) wants patient to continue with word searches instead of [place] eye tracking. Denies falls.   Pt accompanied by:  Wife, Chales Abrahams   PERTINENT HISTORY: CABG, aortic valve replacement (2021)  PAIN:  Are you having pain? Yes: NPRS scale: 1/10 Pain location: Low back and neck Pain description: In low back, achy. In head, disorientation Aggravating factors: Movement  Relieving factors: Ice   PRECAUTIONS: Fall and Other: no bending forward (skull fx)   PATIENT GOALS: "To reduce my symptoms."  OBJECTIVE:   DIAGNOSTIC FINDINGS:   CT Head wo Contrast 09/08/2023 IMPRESSION: 1. Expected evolution of multicompartmental intracranial hemorrhage since 08/28/2023, with no residual hyperdense blood. 2. 3 mm thick hypodense right parietal convexity subdural collection is unchanged and may reflect a subdural hygroma or evolving subdural hematoma. 3. Unchanged right temporal/parietal bone fracture.  CT Lumbar Spine wo Contrast 09/08/2023: IMPRESSION: 1. No acute fracture or traumatic malalignment of the lumbar spine. 2. Postsurgical changes  reflecting posterior instrumented fusion and decompression at L5-S1 without residual spinal canal or neural foraminal stenosis. 3. Adjacent segment disease at L3-L4 resulting in at least moderate spinal canal stenosis and moderate bilateral neural foraminal stenosis.  CT of head from 08/27/23   IMPRESSION: 1. No significant interval change in bilateral frontal convexity subdural fluid collection measuring up to 6 mm. 2. Redemonstrated subarachnoid hemorrhage and hemorrhagic contusions along the bilateral anterior frontal lobes, slightly increased on the left measuring up 2.1 cm ( previously 1.9 cm), and unchanged along the right frontal lobe. Compared to prior exam there is interval increase in conspicuity of subarachnoid blood products along the bilateral parietal lobes, likely due to redistribution. No evidence of intraventricular extension. No hydrocephalus. 3. Redemonstrated acute nondisplaced fracture involving the right temporal bone.  VITALS  There were no vitals filed for this visit.  Asymptomatic for LBP  TODAY'S TREATMENT:           Ther Act  -RPQ-3  1. 3/4  2. 1/4  3. 0/4 -  [facility] PT Assessment - 10/02/23 0001       Standardized Balance Assessment   Standardized Balance Assessment Five Times Sit to Stand    Five times sit to stand comments  9.4           MCTSIB: 120/120 moderate sway in 4th condition   PATIENT EDUCATION: Education details: continue HEP Person educated: Patient and Spouse Education method: Medical illustrator Education comprehension: verbalized understanding and needs further education  HOME EXERCISE PROGRAM:  Access Code: [email or web address] URL: [email or web address] Date: 09/08/2023 Prepared by: Maryruth Eve  Exercises - Corner Balance Feet Together With Eyes Closed  - 1 x daily - 7 x weekly - 3 sets - 30 seconds hold - Pencil Pushups  - 1 x daily - 7 x weekly - 3 sets - 10 reps - Seated Horizontal Saccades  - 1  x daily - 7 x weekly - 3 sets - 10 reps - Seated Vertical Saccades  - 1 x daily - 7 x weekly - 3 sets - 10 reps -[place] eye tracking (1 paragraph)  -King Devick's Saccades 1x a day   Education on SPOT it game and HART chart use, VOR x 1 viewing 3 x 5 rounds Brock string ~3x a day with spouse guiding through   GOALS: Goals reviewed with patient? Yes  SHORT TERM GOALS: Target date:  09/29/2023   Pt will be independent with initial HEP for improved balance, transfers and gait.  Baseline: not established on eval; provided Goal status: MET  2.  Patient will improve RPQ-3 score to </= 2 per item to indicate reduced severity of post-concussive symptoms to progress towards PLOF.    Baseline: 3/4, 3/4 Goal status: NOT MET  3.  5x STS to be assessed and STG/LTG updated  Baseline: 15.65 seconds without UE use (SBA) Goal status: Discontinued STG due to how well performed   4.  Patient will improve mCTSIB score to 100/120 to indicate improved integration of vestibular, proprioceptive, and visual balance systems during static balance tasks in order to reduce risk for falls.   Baseline: 86/120; 120/120 Goal status: MET   LONG TERM GOALS: Target date: 10/27/2023   Pt will be independent with final HEP for improved balance, transfers and gait.  Baseline:  Goal status: INITIAL  2.  Patient will improve RPQ-13 score to </= 2 per item to indicate reduced severity of post-concussive symptoms to progress towards PLOF.   Baseline: greatest deficit reported 4/4  Goal status: INITIAL  3.  Patient will improve their 5x Sit to Stand score to less than 13 seconds to demonstrate a decreased risk for falls and improved LE strength.   Baseline: 15.65 seconds without UE use (SBA) Goal status: INITIAL  4. Patient will improve mCTSIB score to 120/120 to indicate improved integration of vestibular, proprioceptive, and visual balance systems during static balance tasks in order to reduce risk for falls.    Baseline: 86/120 Goal status: INITIAL   ASSESSMENT:  CLINICAL IMPRESSION: Patient seen for skilled PT session with emphasis on goal assessment. Sessions continue to be limited by how verbose patient is and limited ability to redirect to task throughout. RPQ score remains unchanged with greatest reports of HA. Safety awareness and impulsivity remain factors when prescribing home exercises and with limited ability to attend to task in therapy session, patient is making slower progress than one may expect. Continue POC as able.   OBJECTIVE IMPAIRMENTS: Abnormal gait, decreased activity tolerance, decreased balance, decreased cognition, decreased coordination, decreased endurance, decreased knowledge of condition, decreased knowledge of use of DME, decreased mobility, difficulty walking, decreased strength, decreased safety awareness, dizziness, impaired perceived functional ability, impaired vision/preception, and pain  ACTIVITY LIMITATIONS: carrying, lifting, bending, sleeping, stairs, transfers, hygiene/grooming, locomotion level, and caring for others  PARTICIPATION LIMITATIONS: meal prep, cleaning, laundry, interpersonal relationship, driving, shopping, community activity, occupation, and yard work  PERSONAL FACTORS: Fitness, Past/current experiences, and 3+ comorbidities: Bradycardia, post-concussion w/LOC,   are also affecting patient's functional outcome.   REHAB POTENTIAL: Good  CLINICAL DECISION MAKING: Unstable/unpredictable  EVALUATION COMPLEXITY: High  PLAN:  PT FREQUENCY: 2x/week  PT DURATION: 8 weeks  PLANNED INTERVENTIONS: Therapeutic exercises, Therapeutic activity, Neuromuscular re-education, Balance training, Gait training, Patient/Family education, Self Care, Joint mobilization, Stair training, Vestibular training, Canalith repositioning, DME instructions, Aquatic Therapy, Dry Needling, Electrical stimulation, Manual therapy, and Re-evaluation  PLAN FOR NEXT SESSION:  progress occulomotor/vestibular exercises (VORx1 as tolerated, Brock string), progress corner balance (EC on foam, tandem with head turns as able etc), consider neuropthomology referral pending progress; did OT/speech get scheduled, HART chart with balance work, work on Building surveyor with balance training or dynamic gait/balance without AD with visual scanning, midline orientation, assess goals  Westley Foots, PT Westley Foots, PT, DPT, CBIS  10/02/2023, 3:28 PM

## 2023-10-05 ENCOUNTER — Telehealth: Payer: Self-pay | Admitting: Cardiology

## 2023-10-05 ENCOUNTER — Other Ambulatory Visit: Payer: Self-pay

## 2023-10-05 ENCOUNTER — Inpatient Hospital Stay (HOSPITAL_COMMUNITY)
Admission: EM | Admit: 2023-10-05 | Discharge: 2023-10-07 | DRG: 244 | Disposition: A | Discharge status: Home / Self Care | Payer: PPO | Attending: Cardiology | Admitting: Cardiology

## 2023-10-05 DIAGNOSIS — I452 Bifascicular block: Secondary | ICD-10-CM | POA: Diagnosis not present

## 2023-10-05 DIAGNOSIS — E785 Hyperlipidemia, unspecified: Secondary | ICD-10-CM | POA: Diagnosis present

## 2023-10-05 DIAGNOSIS — F429 Obsessive-compulsive disorder, unspecified: Secondary | ICD-10-CM | POA: Diagnosis present

## 2023-10-05 DIAGNOSIS — R42 Dizziness and giddiness: Principal | ICD-10-CM

## 2023-10-05 DIAGNOSIS — Z888 Allergy status to other drugs, medicaments and biological substances status: Secondary | ICD-10-CM

## 2023-10-05 DIAGNOSIS — Z950 Presence of cardiac pacemaker: Secondary | ICD-10-CM | POA: Diagnosis not present

## 2023-10-05 DIAGNOSIS — Z953 Presence of xenogenic heart valve: Secondary | ICD-10-CM

## 2023-10-05 DIAGNOSIS — Z885 Allergy status to narcotic agent status: Secondary | ICD-10-CM

## 2023-10-05 DIAGNOSIS — G8929 Other chronic pain: Secondary | ICD-10-CM | POA: Diagnosis not present

## 2023-10-05 DIAGNOSIS — R001 Bradycardia, unspecified: Secondary | ICD-10-CM | POA: Diagnosis present

## 2023-10-05 DIAGNOSIS — I442 Atrioventricular block, complete: Principal | ICD-10-CM | POA: Diagnosis present

## 2023-10-05 DIAGNOSIS — Z79899 Other long term (current) drug therapy: Secondary | ICD-10-CM

## 2023-10-05 DIAGNOSIS — F319 Bipolar disorder, unspecified: Secondary | ICD-10-CM | POA: Diagnosis present

## 2023-10-05 DIAGNOSIS — Z7951 Long term (current) use of inhaled steroids: Secondary | ICD-10-CM

## 2023-10-05 DIAGNOSIS — I10 Essential (primary) hypertension: Secondary | ICD-10-CM | POA: Diagnosis present

## 2023-10-05 DIAGNOSIS — Z813 Family history of other psychoactive substance abuse and dependence: Secondary | ICD-10-CM

## 2023-10-05 DIAGNOSIS — Z808 Family history of malignant neoplasm of other organs or systems: Secondary | ICD-10-CM | POA: Diagnosis not present

## 2023-10-05 DIAGNOSIS — G629 Polyneuropathy, unspecified: Secondary | ICD-10-CM | POA: Diagnosis present

## 2023-10-05 DIAGNOSIS — Z833 Family history of diabetes mellitus: Secondary | ICD-10-CM

## 2023-10-05 DIAGNOSIS — F1021 Alcohol dependence, in remission: Secondary | ICD-10-CM | POA: Diagnosis not present

## 2023-10-05 DIAGNOSIS — I739 Peripheral vascular disease, unspecified: Secondary | ICD-10-CM | POA: Diagnosis present

## 2023-10-05 DIAGNOSIS — Z8249 Family history of ischemic heart disease and other diseases of the circulatory system: Secondary | ICD-10-CM

## 2023-10-05 DIAGNOSIS — Z823 Family history of stroke: Secondary | ICD-10-CM

## 2023-10-05 DIAGNOSIS — F17210 Nicotine dependence, cigarettes, uncomplicated: Secondary | ICD-10-CM | POA: Diagnosis present

## 2023-10-05 DIAGNOSIS — I2510 Atherosclerotic heart disease of native coronary artery without angina pectoris: Secondary | ICD-10-CM | POA: Diagnosis present

## 2023-10-05 DIAGNOSIS — R062 Wheezing: Secondary | ICD-10-CM | POA: Diagnosis not present

## 2023-10-05 DIAGNOSIS — I441 Atrioventricular block, second degree: Secondary | ICD-10-CM | POA: Diagnosis not present

## 2023-10-05 DIAGNOSIS — Z951 Presence of aortocoronary bypass graft: Secondary | ICD-10-CM

## 2023-10-05 DIAGNOSIS — J9811 Atelectasis: Secondary | ICD-10-CM | POA: Diagnosis not present

## 2023-10-05 DIAGNOSIS — I700 Atherosclerosis of aorta: Secondary | ICD-10-CM | POA: Diagnosis not present

## 2023-10-05 LAB — CBC WITH DIFFERENTIAL/PLATELET
Abs Immature Granulocytes: 0.02 10*3/uL (ref 0.00–0.07)
Basophils Absolute: 0.1 10*3/uL (ref 0.0–0.1)
Basophils Relative: 1 %
Eosinophils Absolute: 0.4 10*3/uL (ref 0.0–0.5)
Eosinophils Relative: 6 %
HCT: 43.3 % (ref 39.0–52.0)
Hemoglobin: 14.2 g/dL (ref 13.0–17.0)
Immature Granulocytes: 0 %
Lymphocytes Relative: 35 %
Lymphs Abs: 2.2 10*3/uL (ref 0.7–4.0)
MCH: 30.3 pg (ref 26.0–34.0)
MCHC: 32.8 g/dL (ref 30.0–36.0)
MCV: 92.3 fL (ref 80.0–100.0)
Monocytes Absolute: 1 10*3/uL (ref 0.1–1.0)
Monocytes Relative: 15 %
Neutro Abs: 2.7 10*3/uL (ref 1.7–7.7)
Neutrophils Relative %: 43 %
Platelets: 143 10*3/uL — ABNORMAL LOW (ref 150–400)
RBC: 4.69 MIL/uL (ref 4.22–5.81)
RDW: 15.3 % (ref 11.5–15.5)
WBC: 6.4 10*3/uL (ref 4.0–10.5)
nRBC: 0 % (ref 0.0–0.2)

## 2023-10-05 LAB — BASIC METABOLIC PANEL
Anion gap: 12 (ref 5–15)
BUN: 18 mg/dL (ref 8–23)
CO2: 27 mmol/L (ref 22–32)
Calcium: 9.4 mg/dL (ref 8.9–10.3)
Chloride: 102 mmol/L (ref 98–111)
Creatinine, Ser: 1.06 mg/dL (ref 0.61–1.24)
GFR, Estimated: >60 mL/min (ref >60)
Glucose, Bld: 94 mg/dL (ref 70–99)
Potassium: 4.5 mmol/L (ref 3.5–5.1)
Sodium: 141 mmol/L (ref 135–145)

## 2023-10-05 LAB — TROPONIN I (HIGH SENSITIVITY)
Troponin I (High Sensitivity): 16 ng/L (ref <18)
Troponin I (High Sensitivity): 15 ng/L (ref <18)

## 2023-10-05 MED ORDER — MOMETASONE FURO-FORMOTEROL FUM 200-5 MCG/ACT IN AERO
2.0000 | INHALATION_SPRAY | Freq: Two times a day (BID) | RESPIRATORY_TRACT | Status: DC
  Filled 2023-10-05: qty 8.8

## 2023-10-05 MED ORDER — ACETAMINOPHEN 650 MG RE SUPP: 650.0000 mg | Freq: Four times a day (QID) | RECTAL | Status: DC | PRN

## 2023-10-05 MED ORDER — ALBUTEROL SULFATE (2.5 MG/3ML) 0.083% IN NEBU
2.5000 mg | INHALATION_SOLUTION | RESPIRATORY_TRACT | Status: DC | PRN
  Administered 2023-10-06: 2.5 mg via RESPIRATORY_TRACT
  Filled 2023-10-05: qty 3

## 2023-10-05 MED ORDER — HYDROCHLOROTHIAZIDE 12.5 MG PO TABS
12.5000 mg | ORAL_TABLET | Freq: Every day | ORAL | Status: DC
  Administered 2023-10-06 – 2023-10-07 (×2): 12.5 mg via ORAL
  Filled 2023-10-05 (×2): qty 1

## 2023-10-05 MED ORDER — ATORVASTATIN CALCIUM 80 MG PO TABS
80.0000 mg | ORAL_TABLET | Freq: Every day | ORAL | Status: DC
  Administered 2023-10-06 – 2023-10-07 (×2): 80 mg via ORAL
  Filled 2023-10-05 (×2): qty 1

## 2023-10-05 MED ORDER — ENOXAPARIN SODIUM 40 MG/0.4ML IJ SOSY: 40.0000 mg | PREFILLED_SYRINGE | INTRAMUSCULAR | Status: DC

## 2023-10-05 MED ORDER — ACETAMINOPHEN 325 MG PO TABS
650.0000 mg | ORAL_TABLET | Freq: Four times a day (QID) | ORAL | Status: DC | PRN
  Administered 2023-10-06 – 2023-10-07 (×3): 650 mg via ORAL
  Filled 2023-10-05 (×3): qty 2

## 2023-10-05 MED ORDER — FINASTERIDE 5 MG PO TABS
5.0000 mg | ORAL_TABLET | Freq: Every day | ORAL | Status: DC
  Administered 2023-10-06 (×2): 5 mg via ORAL
  Filled 2023-10-05 (×2): qty 1

## 2023-10-05 MED ORDER — LISINOPRIL-HYDROCHLOROTHIAZIDE 20-12.5 MG PO TABS: 1.0000 | ORAL_TABLET | Freq: Every morning | ORAL | Status: DC

## 2023-10-05 MED ORDER — ALBUTEROL SULFATE HFA 108 (90 BASE) MCG/ACT IN AERS: 1.0000 | INHALATION_SPRAY | RESPIRATORY_TRACT | Status: DC | PRN

## 2023-10-05 MED ORDER — AMLODIPINE BESYLATE 10 MG PO TABS
10.0000 mg | ORAL_TABLET | Freq: Every day | ORAL | Status: DC
  Administered 2023-10-06 – 2023-10-07 (×2): 10 mg via ORAL
  Filled 2023-10-05 (×2): qty 1

## 2023-10-05 MED ORDER — LISINOPRIL 20 MG PO TABS
20.0000 mg | ORAL_TABLET | Freq: Every day | ORAL | Status: DC
  Administered 2023-10-06 – 2023-10-07 (×2): 20 mg via ORAL
  Filled 2023-10-05 (×2): qty 1

## 2023-10-05 MED ORDER — DIVALPROEX SODIUM ER 500 MG PO TB24
1000.0000 mg | ORAL_TABLET | Freq: Every evening | ORAL | Status: DC
  Administered 2023-10-06 (×2): 1000 mg via ORAL
  Filled 2023-10-05 (×4): qty 2

## 2023-10-05 MED ORDER — LAMOTRIGINE 100 MG PO TABS
200.0000 mg | ORAL_TABLET | Freq: Every morning | ORAL | Status: DC
  Administered 2023-10-06 – 2023-10-07 (×2): 200 mg via ORAL
  Filled 2023-10-05 (×2): qty 2

## 2023-10-05 NOTE — ED Provider Notes (Signed)
[place] EMERGENCY DEPARTMENT AT [facility] Provider Note   CSN: [id number] Arrival date & time: 10/05/23  1820     History  Chief Complaint  Patient presents with   Dizziness   Bradycardia    Gregory [name] is a 72 y.o. male.  72 year old male with past medical history of hypertension, hyperlipidemia, CAD status post CABG, aortic valve replacement, known right bundle branch block, recent skull fracture with subarachnoid hemorrhage presenting for evaluation of dizziness and bradycardia.  Patient states that he has been intermittently feeling dizzy for the last several weeks.  He was seen by his cardiologist and a Zio patch was placed 5 to 6 days ago.  He says that over the last 2 days he has had 2-3 distinct episodes of symptomatic bradycardia.  He says he correlates his Fitbit watch which tracks his heart rate.  He says that at times his heart rate will drop as low as 42 and he will feel lightheaded and dizzy, like he cannot stand up.  When his heart rate improved his symptoms resolved.  He has recorded his Zio patch several times.  He says that his cardiologist called him this afternoon stating that he was in complete heart block and need to come to the emergency department.  Denies any recent fevers, chest pain, shortness of breath.  No known illnesses.    Dizziness      Home Medications Prior to Admission medications   Medication Sig Start Date End Date Taking? Authorizing Provider  albuterol (VENTOLIN HFA) 108 (90 Base) MCG/ACT inhaler INHALE TWO PUFFS BY MOUTH EVERY 6 HOURS AS NEEDED FOR WHEEZING 04/16/23  Yes Ronnald Nian, MD  amLODipine (NORVASC) 10 MG tablet Take 1 tablet (10 mg total) by mouth daily. 04/16/23  Yes Ronnald Nian, MD  amoxicillin (AMOXIL) 500 MG capsule Take 4 capsules (2,000 mg total) by mouth once as needed for up to 1 dose. Take 4 tablets one hour before dental work 06/02/22  Yes [place], [name], MD  atorvastatin (LIPITOR) 80 MG  tablet Take 1 tablet (80 mg total) by mouth daily. 04/16/23  Yes Ronnald Nian, MD  azelastine (ASTELIN) 0.1 % nasal spray Place 1-2 sprays into both nostrils 2 (two) times daily as needed (nasal drainage). Use in each nostril as directed 04/16/23  Yes Ronnald Nian, MD  divalproex (DEPAKOTE ER) 500 MG 24 hr tablet Take 1 tablet (500 mg total) by mouth 3 (three) times daily. Patient taking differently: Take 1,000 mg by mouth every evening. 04/16/23  Yes Ronnald Nian, MD  finasteride (PROSCAR) 5 MG tablet Take 1 tablet (5 mg total) by mouth daily. 04/16/23  Yes Ronnald Nian, MD  fluticasone-salmeterol (ADVAIR DISKUS) 500-50 MCG/ACT AEPB Inhale 1 puff into the lungs in the morning and at bedtime. 04/16/23  Yes Ronnald Nian, MD  lamoTRIgine (LAMICTAL) 200 MG tablet Take 1 tablet (200 mg total) by mouth every morning. 04/16/23  Yes Ronnald Nian, MD  lisinopril-hydrochlorothiazide (ZESTORETIC) 20-12.5 MG tablet Take 1 tablet by mouth every morning. 04/16/23  Yes Ronnald Nian, MD  nicotine polacrilex (COMMIT) 4 MG lozenge Take 4 mg by mouth as needed for smoking cessation.   Yes [provider]      Allergies    Codeine and Dihydrocodeine    Review of Systems   Review of Systems  Neurological:  Positive for dizziness.    Physical Exam Updated Vital Signs BP (!) 141/67   Pulse [name])  46   Temp 98.1 F (36.7 C) (Oral)   Resp 14   SpO2 97%  Physical Exam Constitutional:      General: He is not in acute distress.    Appearance: He is not ill-appearing.  HENT:     Head: Normocephalic.     Right Ear: External ear normal.     Left Ear: External ear normal.     Nose: Nose normal.     Mouth/Throat:     Mouth: Mucous membranes are moist.  Eyes:     Extraocular Movements: Extraocular movements intact.     Conjunctiva/sclera: Conjunctivae normal.     Pupils: Pupils are equal, round, and reactive to light.  Cardiovascular:     Rate and Rhythm: Regular rhythm. Bradycardia  present.     Pulses: Normal pulses.  Pulmonary:     Effort: Pulmonary effort is normal. No respiratory distress.     Breath sounds: No wheezing.  Abdominal:     General: Abdomen is flat.     Palpations: Abdomen is soft.     Tenderness: There is no abdominal tenderness. There is no guarding or rebound.  Musculoskeletal:        General: Normal range of motion.     Right lower leg: No edema.     Left lower leg: No edema.  Skin:    General: Skin is warm and dry.     Findings: No rash.  Neurological:     General: No focal deficit present.     Mental Status: He is alert.     Cranial Nerves: No cranial nerve deficit.     Sensory: No sensory deficit.     Motor: No weakness.     ED Results / Procedures / Treatments   Labs (all labs ordered are listed, but only abnormal results are displayed) Labs Reviewed  CBC WITH DIFFERENTIAL/PLATELET - Abnormal; Notable for the following components:      Result Value   Platelets 143 (*)    All other components within normal limits  BASIC METABOLIC PANEL  TROPONIN I (HIGH SENSITIVITY)  TROPONIN I (HIGH SENSITIVITY)    EKG EKG Interpretation Date/Time:  Monday October 05 2023 18:42:58 EDT Ventricular Rate:  52 PR Interval:  176 QRS Duration:  158 QT Interval:  430 QTC Calculation: 399 R Axis:   61  Text Interpretation: Sinus bradycardia Right bundle branch block Confirmed by Cathren Laine ([id number]) on 10/05/2023 7:54:20 PM  Radiology No results found.  Procedures Procedures    Medications Ordered in ED Medications - No data to display  ED Course/ Medical Decision Making/ A&P                                 Medical Decision Making Risk Decision regarding hospitalization.   72 year old male with past medical history and HPI as above.  On arrival, patient has a heart rate of 57, which was continuously around 50 when I was in the room with him.  Blood pressure was actually hypertensive throughout.  Patient was in no acute  distress and reportedly asymptomatic with no lightheadedness, dizziness, chest pain, shortness of breath.  Overall nontoxic-appearing.  Cardiology telephone notes from earlier today were reviewed.  He did have an episode of high-grade AV block with a heart rate of 31 to 34 bpm lasting 60 seconds.  Approximately 20 minutes after the first episode he had another episode of high-grade AV block with a  heart rate of 32-34 lasting 3 minutes.  EKG obtained today shows right bundle branch block (chronic finding), but a sinus rhythm of approximately 50.  No high degree AV block appreciated.  Not a STEMI.  Baseline lab work also obtained which was unremarkable including no signs of electrolyte derangements, AKI, leukocytosis.  Troponin was 16.  I did reach out to cardiology in consultation due to concern for symptomatic bradycardia versus symptomatic AV block.  They evaluated patient at bedside and are planning for admission.  Patient required no additional acute intervention prior to transitioning to the inpatient cardiology team.   Final Clinical Impression(s) / ED Diagnoses Final diagnoses:  Dizziness    Rx / DC Orders ED Discharge Orders     None         Lyman Speller, MD 10/05/23 2217    Cathren Laine, MD 10/06/23 1337

## 2023-10-05 NOTE — ED Provider Triage Note (Signed)
Emergency Medicine Provider Triage Evaluation Note  Gregory [name] , a 72 y.o. male  was evaluated in triage.  Patient has been wearing a ZIO monitor and the cardiologist called patient told him to come to the ED due to 2 events of a high grade AV heart block today.  Patient had CABG x1 and valve replacement in 2020.  Denies chest pain or shortness of breath, but has been having dizziness and fatigue.    Review of Systems  Positive: As above Negative: As above  Physical Exam  BP (!) 175/75 (BP Location: Right Arm)   Pulse (!) 57   Temp 98.1 F (36.7 C) (Oral)   Resp 19   SpO2 99%  Gen:   Awake, no distress   Resp:  Normal effort  MSK:   Moves extremities without difficulty  Other:  HR is bradycardic in the 50s  Medical Decision Making  Medically screening exam initiated at 6:44 PM.  Appropriate orders placed.  Gregory [name] was informed that the remainder of the evaluation will be completed by another provider, this initial triage assessment does not replace that evaluation, and the importance of remaining in the ED until their evaluation is complete.     Lenard Simmer, [place] 10/05/23 1904

## 2023-10-05 NOTE — Telephone Encounter (Signed)
Danielle with [employer] calling with abnormal results

## 2023-10-05 NOTE — Telephone Encounter (Signed)
Call to patient lvm to return call to office @1200   Left voicemail to return call to office at 5PM  Left voicemail on spouse phone to return call to office at 530 pm

## 2023-10-05 NOTE — H&P (Signed)
Cardiology Admission History and Physical   Patient ID: Gregory [name] MRN: [id number]; DOB: March 20, 1951   Admission date: 10/05/2023  PCP:  Gregory Nian, MD   [facility] Providers Cardiologist:  Gregory [place], MD        Chief Complaint: Lightheadedness  Patient Profile:   Gregory [name] is a left handed 72 y.o. male with obstructive CAD s/p CABG, aortic valve stenosis s/p AVR (23mm Biosprosthetic), PAD, known RBBB (following CABG/AVR), hypertension, hyperlipidemia, ongoing tobacco use who is being seen 10/05/2023 for the evaluation of lightheadedness and abnormal Zio patch.  History of Present Illness:   Gregory [name] has a history of obstructive coronary disease, severe aortic stenosis and known RBBB.  In 11/2019, underwent CABG x 1 (LIMA-LAD for 99% left main stenosis) and AVR for severe aortic stenosis.  Per report, following surgery, had multiple days of complete heart block which ultimately recovered though with residual RBBB (present prior to surgery as well).  No history of LV systolic dysfunction.  Also with known severe PAD.  Was seen by Gregory [name] on 04/10/20 for claudication. Vascular ultrasounds showed bilateral iliac artery occlusions. Follow-up CTA abdomen pelvis showed infrarenal aortic stenosis with right common iliac artery occlusion and occluded left external iliac and common femoral arteries. He was referred to vascular surgery and underwent aortofemoral bypass grafting.   In June 2024, he had noted that his heart rate was going into the 40s monitored via his smart watch.  EKG at that time with RBBB and LPFB with sinus bradycardia.  He underwent 14 days of cardiac monitoring which revealed normal sinus rhythm, rare PACs, PVCs and patient triggered events not associated with arrhythmia.   He suffered a fall on 08/25/2023 secondary to syncope.  States that he was standing upright while shopping and had a prodrome of lightheadedness and then collapsed with  a resulting head strike.  CT head revealed scattered posttraumatic subarachnoid hemorrhages and nondisplaced fracture of the right temporoparietal calvarium.  Neurosurgery was consulted though he did not require any neurosurgical interventions.  A clear etiology of his syncope was not identified.  He returned to cardiology clinic on 09/28/2023 reporting feeling lightheaded when his heart rates in the 40s prompting 2 weeks Zio patch placement.  Not on AV nodal blockade.  Electrolytes and TSH normal at time of that visit.  Echo ordered but not obtained yet.  Tells me on interview 10/14 after presenting to the emergency room that he has been experiencing lightheadedness with lower heart rates in the low 40s over the last 2 days.  On 10/13, his heart rate was in the 40s while seated watching a soccer game with associated lightheadedness making him unable to stand up.  Symptoms improved when heart rate increased into the 60s-70s.  This morning, noted recurrent heart rates in the 40s with associated lightheadedness and inability to stand/walk due to the symptoms.  Shortly thereafter, he was contacted by his clinic team with a Zio patch reported results of high-grade AV block with HR in the low 30s, 1 episode lasting 60 seconds and another episode lasting 3 minutes.  He was instructed to present to the ED immediately.  On presentation to the ED, afebrile, HR persistently in the 40s, BP 140-150s/70s.  CBC and BMP completely unremarkable.  High sensitivity troponins 16->15.  EKG with sinus bradycardia and RBBB.  He is left-handed and denies any previous clavicular fractures.  Continues to smoke 0.5 packs/day of cigarettes.  Past Medical History:  Diagnosis Date  Alcohol abuse    Aortic stenosis    Arthritis    Asthma    exercise indused or pollen-uses inhaler dail yand has rescue inhaler if needed   Bipolar disorder (HCC)    CAD (coronary artery disease)    coronary calcifications 08/2013 CTA   Carotid  artery occlusion    Cataract    bilateral sx   Chronic back pain    Claudication (HCC)    Complication of anesthesia 10/16/2020   "had a panic attack when the mask was put on me"" I do not think I was given anything  before, I need something to calm me down"   Depression    Family history of skin cancer    Heart murmur    as an infant   Hepatitis    h/o 1970,doesn't remember which type,1990 epstain-barr   Herpes zoster without complication 09/05/2019   Hyperlipidemia    Hypertension    Neuromuscular disorder (HCC)    Obsessive compulsive disorder    PAD (peripheral artery disease) (HCC)    Peripheral neuropathy    Pneumonia    RBBB    RBBB (right bundle branch block with left anterior fascicular block)    Seasonal allergies    Severe aortic stenosis    Sleep apnea    does not wear CPAP   Smoker    Spinal stenosis at L4-L5 level    Tobacco abuse    Tobacco use disorder     Past Surgical History:  Procedure Laterality Date   ANAL FISTULECTOMY  09/25/2011   AORTA - BILATERAL FEMORAL ARTERY BYPASS GRAFT N/A 10/03/2020   Procedure: AORTA BIFEMORAL BYPASS GRAFT USING A HEMASHIELD GOLD BIFURCATED 14 X 7mm GRAFT AND INFERIOR MESENTERIC ARTERY REIMPLANTATION;  Surgeon: Nada Libman, MD;  Location: [facility] OR;  Service: Vascular;  Laterality: N/A;   AORTIC VALVE REPLACEMENT N/A 11/24/2019   Procedure: AORTIC VALVE REPLACEMENT (AVR) using INSPIRIS Resilia 23 MM Bioprosthetic Aortic Valve.;  Surgeon: Alleen Borne, MD;  Location: [facility] OR;  Service: Open Heart Surgery;  Laterality: N/A;   APPLICATION OF WOUND VAC Left 10/17/2020   Procedure: APPLICATION OF WOUND VAC;  Surgeon: Nada Libman, MD;  Location: [facility] OR;  Service: Vascular;  Laterality: Left;   BACK SURGERY     2015   CATARACT EXTRACTION, BILATERAL  2022   COLONOSCOPY  2009   MS-F/V-mov(exc)-HPP   CORONARY ARTERY BYPASS GRAFT N/A 11/24/2019   Procedure: CORONARY ARTERY BYPASS GRAFTING (CABG) using LIMA to LAD.;  Surgeon:  Alleen Borne, MD;  Location: [facility] OR;  Service: Open Heart Surgery;  Laterality: N/A;   ELBOW SURGERY     bilaterally for cubital tunnel   ENDARTERECTOMY N/A 10/03/2020   Procedure: AORTIC ENDARTERECTOMY;  Surgeon: Nada Libman, MD;  Location: [facility] OR;  Service: Vascular;  Laterality: N/A;   ENDARTERECTOMY FEMORAL Bilateral 10/03/2020   Procedure: BILATERAL FEMORAL ENDARTERECTOMY;  Surgeon: Nada Libman, MD;  Location: [facility] OR;  Service: Vascular;  Laterality: Bilateral;   HERNIA REPAIR     with mesh   INCISION AND DRAINAGE Left 01/02/2021   Procedure: INCISION AND DRAINAGE LEFT GROIN WITH STIMULAN BEADS;  Surgeon: Nada Libman, MD;  Location: [facility] OR;  Service: Vascular;  Laterality: Left;   INCISION AND DRAINAGE OF WOUND Left 10/17/2020   Procedure: EXPLORATION LEFT GROIN WOUND;  Surgeon: Nada Libman, MD;  Location: [facility] OR;  Service: Vascular;  Laterality: Left;   MAXIMUM ACCESS (MAS)POSTERIOR LUMBAR INTERBODY FUSION (PLIF)  1 LEVEL N/A 10/25/2014   Procedure: LUMBAR FOUR TO FIVE MAXIMUM ACCESS (MAS) POSTERIOR LUMBAR INTERBODY FUSION (PLIF) 1 LEVEL;  Surgeon: Tia Alert, MD;  Location: [facility];  Service: Neurosurgery;  Laterality: N/A;   RIGHT/LEFT HEART CATH AND CORONARY ANGIOGRAPHY N/A 11/10/2019   Procedure: RIGHT/LEFT HEART CATH AND CORONARY ANGIOGRAPHY;  Surgeon: [name], Gregory M, MD;  Location: [facility] INVASIVE CV [facility];  Service: Cardiovascular;  Laterality: N/A;   SKIN BIOPSY Left 10/12/2018   shave forehead Hypertrophic actinic kertosis with features of a verruca   TEE WITHOUT CARDIOVERSION N/A 11/24/2019   Procedure: TRANSESOPHAGEAL ECHOCARDIOGRAM (TEE);  Surgeon: Alleen Borne, MD;  Location: [facility] OR;  Service: Open Heart Surgery;  Laterality: N/A;   TONSILLECTOMY  age 52   VASECTOMY     X 2   VASECTOMY REVERSAL       Medications Prior to Admission: Prior to Admission medications   Medication Sig Start Date End Date Taking? Authorizing Provider  albuterol (VENTOLIN  HFA) 108 (90 Base) MCG/ACT inhaler INHALE TWO PUFFS BY MOUTH EVERY 6 HOURS AS NEEDED FOR WHEEZING 04/16/23  Yes Gregory Nian, MD  amLODipine (NORVASC) 10 MG tablet Take 1 tablet (10 mg total) by mouth daily. 04/16/23  Yes Gregory Nian, MD  amoxicillin (AMOXIL) 500 MG capsule Take 4 capsules (2,000 mg total) by mouth once as needed for up to 1 dose. Take 4 tablets one hour before dental work 06/02/22  Yes [name], Gregory M, MD  atorvastatin (LIPITOR) 80 MG tablet Take 1 tablet (80 mg total) by mouth daily. 04/16/23  Yes Gregory Nian, MD  azelastine (ASTELIN) 0.1 % nasal spray Place 1-2 sprays into both nostrils 2 (two) times daily as needed (nasal drainage). Use in each nostril as directed 04/16/23  Yes Gregory Nian, MD  divalproex (DEPAKOTE ER) 500 MG 24 hr tablet Take 1 tablet (500 mg total) by mouth 3 (three) times daily. Patient taking differently: Take 1,000 mg by mouth every evening. 04/16/23  Yes Gregory Nian, MD  finasteride (PROSCAR) 5 MG tablet Take 1 tablet (5 mg total) by mouth daily. 04/16/23  Yes Gregory Nian, MD  fluticasone-salmeterol (ADVAIR DISKUS) 500-50 MCG/ACT AEPB Inhale 1 puff into the lungs in the morning and at bedtime. 04/16/23  Yes Gregory Nian, MD  lamoTRIgine (LAMICTAL) 200 MG tablet Take 1 tablet (200 mg total) by mouth every morning. 04/16/23  Yes Gregory Nian, MD  lisinopril-hydrochlorothiazide (ZESTORETIC) 20-12.5 MG tablet Take 1 tablet by mouth every morning. 04/16/23  Yes Gregory Nian, MD  nicotine polacrilex (COMMIT) 4 MG lozenge Take 4 mg by mouth as needed for smoking cessation.   Yes [provider]     Allergies:    Allergies  Allergen Reactions   Codeine Anaphylaxis   Dihydrocodeine Anaphylaxis    Social History:   Social History   Socioeconomic History   Marital status: Married    Spouse name: Not on file   Number of children: 6   Years of education: Not on file   Highest education level: Not on file  Occupational History    Not on file  Tobacco Use   Smoking status: Every Day    Current packs/day: 0.50    Average packs/day: 0.5 packs/day for 25.0 years (12.5 ttl pk-yrs)    Types: Cigarettes   Smokeless tobacco: Never   Tobacco comments:    Pt had stopped smoking X10 years/ currently smoking a 1.5 pack a day  Vaping Use   Vaping status: Never Used  Substance and Sexual Activity   Alcohol use: Not Currently    Comment: 06/26/2014- last drink- AA   Drug use: No    Comment: former alcoholic   Sexual activity: Yes  Other Topics Concern   Not on file  Social History Narrative   Lives in [place] with wife.  Does not routinely exercise.  Sedentary r/t chronic lbp.   Social Determinants of Health   Financial Resource Strain: Low Risk  (04/10/2023)   Overall Financial Resource Strain (CARDIA)    Difficulty of Paying Living Expenses: Not very hard  Food Insecurity: No Food Insecurity (08/26/2023)   Hunger Vital Sign    Worried About Running Out of Food in the Last Year: Never true    Ran Out of Food in the Last Year: Never true  Transportation Needs: No Transportation Needs (08/26/2023)   PRAPARE - Administrator, Civil Service (Medical): No    Lack of Transportation (Non-Medical): No  Physical Activity: Sufficiently Active (04/10/2023)   Exercise Vital Sign    Days of Exercise per Week: 4 days    Minutes of Exercise per Session: 150+ min  Stress: Stress Concern Present (04/10/2023)   [employer] of Occupational Health - Occupational Stress Questionnaire    Feeling of Stress : To some extent  Social Connections: Unknown (04/10/2023)   Social Connection and Isolation Panel [NHANES]    Frequency of Communication with Friends and Family: Once a week    Frequency of Social Gatherings with Friends and Family: Never    Attends Religious Services: Not on Marketing executive or Organizations: Yes    Attends Banker Meetings: More than 4 times per year    Marital Status:  Married  Catering manager Violence: Not At Risk (08/26/2023)   Humiliation, Afraid, Rape, and Kick questionnaire    Fear of Current or Ex-Partner: No    Emotionally Abused: No    Physically Abused: No    Sexually Abused: No    Family History:   The patient's family history includes Cancer in his father, maternal aunt, and maternal grandmother; Colon polyps (age of onset: 32) in his mother; Diabetes in his brother; Drug abuse in his brother and father; Heart disease in his brother; Heart failure in his mother; Hypertension in his brother; Stroke (age of onset: 16) in his father. There is no history of Colon cancer, Esophageal cancer, Rectal cancer, or Stomach cancer.    ROS:  Please see the history of present illness.  All other ROS reviewed and negative.     Physical Exam/Data:   Vitals:   10/05/23 2145 10/05/23 2215 10/05/23 2230 10/05/23 2245  BP: (!) 141/67 (!) 145/62 (!) 146/71 (!) 153/77  Pulse: (!) 46 (!) 44 (!) 47 (!) 46  Resp: 14 15 15 12   Temp:      TempSrc:      SpO2: 97% 95% 97% 97%   No intake or output data in the 24 hours ending 10/05/23 2259    09/28/2023    2:29 PM 09/02/2023   10:11 AM 08/29/2023    5:00 AM  Last 3 Weights  Weight (lbs) 136 lb 12.8 oz 128 lb 3.2 oz 138 lb 14.4 oz  Weight (kg) 62.052 kg 58.151 kg 63.005 kg     There is no height or weight on file to calculate BMI.  General:  Well nourished, well developed, in no acute distress  HEENT: normal Neck: 3 cm above clavicle at 45 degrees Vascular: No carotid bruits; Distal pulses 2+ bilaterally   Cardiac: Bradycardic and regular without murmur Lungs: End expiratory wheezes, breathing comfortably on room air Abd: soft, nontender, no hepatomegaly  Ext: no edema Musculoskeletal:  No deformities, BUE and BLE strength normal and equal Skin: warm and dry  Neuro:  CNs 2-12 intact, no focal abnormalities noted Psych:  Normal affect    EKG:  The ECG that was done 10/14 was personally reviewed and  demonstrates sinus bradycardia with RBBB  Telemetry was independently reviewed by me and shows consistent sinus bradycardia with right bundle branch block with ventricular rates in the 40s with no evidence of high degree AV block  Relevant CV Studies: 11/2021 echo 1. Left ventricular ejection fraction, by visual estimation, is 70 to  75%. The left ventricle has normal function. There is no left ventricular  hypertrophy.   2. The left ventricle has no regional wall motion abnormalities.   3. Global right ventricle has normal systolic function.The right  ventricular size is normal. No increase in right ventricular wall  thickness.   4. Left atrial size was normal.   5. Right atrial size was normal.   6. The mitral valve is normal in structure. No evidence of mitral valve  regurgitation. No evidence of mitral stenosis.   7. The tricuspid valve is normal in structure. Tricuspid valve  regurgitation is trivial.   8. Aortic valve regurgitation is not visualized.   9. Aortic Valve Replacement-46mm Biosprosthetic Peak velocity 2.6m/s,  mean .  10. The pulmonic valve was normal in structure. Pulmonic valve  regurgitation is not visualized.  11. Mildly elevated pulmonary artery systolic pressure.  12. The inferior vena cava is normal in size with greater than 50%  respiratory variability, suggesting right atrial pressure of 3 mmHg.   Laboratory Data:  High Sensitivity Troponin:   Recent Labs  Lab 10/05/23 1911 10/05/23 2138  TROPONINIHS 16 15      Chemistry Recent Labs  Lab 10/05/23 1911  NA 141  K 4.5  CL 102  CO2 27  GLUCOSE 94  BUN 18  CREATININE 1.06  CALCIUM 9.4  GFRNONAA >60  ANIONGAP 12    No results for input(s): "PROT", "ALBUMIN", "AST", "ALT", "ALKPHOS", "BILITOT" in the last 168 hours. Lipids No results for input(s): "CHOL", "TRIG", "HDL", "LABVLDL", "LDLCALC", "CHOLHDL" in the last 168 hours. Hematology Recent Labs  Lab 10/05/23 1911  WBC 6.4  RBC  4.69  HGB 14.2  HCT 43.3  MCV 92.3  MCH 30.3  MCHC 32.8  RDW 15.3  PLT 143*   Thyroid No results for input(s): "TSH", "FREET4" in the last 168 hours. BNPNo results for input(s): "BNP", "PROBNP" in the last 168 hours.  DDimer No results for input(s): "DDIMER" in the last 168 hours.   Radiology/Studies:  No results found.   Assessment and Plan:   Symptomatic bradycardia Concern for high-grade AV block Recent syncopal event Presents following recent episodes of lightheadedness and reported Zio patch results concerning for high-grade AV block with ventricular rates in the 30s, 1 episode lasting 60 seconds and another episode lasting 3 minutes.  Fortunately, unable to see the telemetry strips of these events at this time.  However, based on history, the events seem to align with his symptoms of lightheadedness.  He has known infrahisian conduction disease that predates his AVR.  He likely has worsened degenerative conduction disease with possible transient high-grade AV block.  Symptomatic  sinus node dysfunction (sick sinus syndrome) also possible given sinus bradycardia with associated symptoms.  Does appear to have chronotropic competence based on history.  Interestingly, based on telemetry, has ventricular rates are predominately in the 40s (vs acute drops to 40s).  His symptoms may also be due to his recent syncopal event with head strike resulting in subarachnoid hemorrhage and skull fracture.  However, the etiology of his syncopal event was not clearly explained and may be related to his conduction disease.  As such, we will admit him for further observation/workup of conduction disease and consideration of PPM with our electrophysiology colleagues.  He is asymptomatic at this time and workup has been largely unremarkable thus far.  L-handed, no history of clavicular fractures. - Remain on telemetry - Recent electrolytes and TSH normal - Troponins flat, very low concern for ACS - TTE in  a.m. - EP consultation - N.p.o. at midnight for possible PPM - will see if we can obtain Ziopatch strips in AM  Obstructive CAD s/p CABG Aortic valve stenosis s/p AVR with 23 mm bioprosthetic PAD Hypertension - Holding aspirin given recent SAH (see below) - Continue Lipitor, amlodipine, lisinopril/HCTZ  Subarachnoid hemorrhage and nondisplaced fracture of the right temporoparietal calvarium from syncopal event 08/25/23 - Have been holding aspirin per NSU recommendations - Never received surgical intervention - Has been experiencing balance issues since event  9. Bipolar disorder - Continue home Lamictal and Depakote   Risk Assessment/Risk Scores:          Code Status: Full Code  Severity of Illness: The appropriate patient status for this patient is INPATIENT. Inpatient status is judged to be reasonable and necessary in order to provide the required intensity of service to ensure the patient's safety. The patient's presenting symptoms, physical exam findings, and initial radiographic and laboratory data in the context of their chronic comorbidities is felt to place them at high risk for further clinical deterioration. Furthermore, it is not anticipated that the patient will be medically stable for discharge from the hospital within 2 midnights of admission.   * I certify that at the point of admission it is my clinical judgment that the patient will require inpatient hospital care spanning beyond 2 midnights from the point of admission due to high intensity of service, high risk for further deterioration and high frequency of surveillance required.*   For questions or updates, please contact [facility] Please consult [email or web address] for contact info under     Signed, Aundra Dubin, MD  10/05/2023 10:59 PM

## 2023-10-05 NOTE — Telephone Encounter (Signed)
Gregory [name] with Irythm,   Episode high grade AV block hr 31-34 bpm lasting 60 sec today at 926 am  Episode at 943 high grade AV block 32-34 bpm it lasted 3 minutes   Patient asymptomatic. Will wait for report to be uploaded in system.

## 2023-10-05 NOTE — ED Notes (Signed)
ED TO INPATIENT HANDOFF REPORT  ED Nurse Name and Phone #: [phone] Pennie Rushing A. Romesha Scherer, RN  S Name/Age/Gender Gregory [name] 72 y.o. male Room/Bed: 015C/015C  Code Status   Code Status: Full Code  Home/SNF/Other Home Patient oriented to: self, place, time, and situation Is this baseline? Yes   Triage Complete: Triage complete  Chief Complaint Symptomatic bradycardia [R00.1]  Triage Note Pt to ED POV from home. Pt states his cardiologist has pt wearing a zio monitor and cardiologist called pt and told him to come to ED for complete heart block. Pt had CABG and valve replacement in 2020. Pt denies any CP but does endorse dizziness and fatigue. Pt denies any SOB.    Allergies Allergies  Allergen Reactions   Codeine Anaphylaxis   Dihydrocodeine Anaphylaxis    Level of Care/Admitting Diagnosis ED Disposition     ED Disposition  Admit   Condition  --   Comment  Hospital Area: [facility] [[id number]]  Level of Care: Progressive [102]  Admit to Progressive based on following criteria: CARDIOVASCULAR & THORACIC of moderate stability with acute coronary syndrome symptoms/low risk myocardial infarction/hypertensive urgency/arrhythmias/heart failure potentially compromising stability and stable post cardiovascular intervention patients.  May admit patient to [facility] or [facility] if equivalent level of care is available:: No  Covid Evaluation: Asymptomatic - no recent exposure (last 10 days) testing not required  Diagnosis: Symptomatic bradycardia [654052]  Admitting Physician: Aundra Dubin [[id number]]  Attending Physician: Aundra Dubin [[id number]]  Certification:: I certify this patient will need inpatient services for at least 2 midnights  Expected Medical Readiness: 10/07/2023          B Medical/Surgery History Past Medical History:  Diagnosis Date   Alcohol abuse    Aortic stenosis    Arthritis    Asthma    exercise indused or pollen-uses  inhaler dail yand has rescue inhaler if needed   Bipolar disorder (HCC)    CAD (coronary artery disease)    coronary calcifications 08/2013 CTA   Carotid artery occlusion    Cataract    bilateral sx   Chronic back pain    Claudication (HCC)    Complication of anesthesia 10/16/2020   "had a panic attack when the mask was put on me"" I do not think I was given anything  before, I need something to calm me down"   Depression    Family history of skin cancer    Heart murmur    as an infant   Hepatitis    h/o 1970,doesn't remember which type,1990 epstain-barr   Herpes zoster without complication 09/05/2019   Hyperlipidemia    Hypertension    Neuromuscular disorder (HCC)    Obsessive compulsive disorder    PAD (peripheral artery disease) (HCC)    Peripheral neuropathy    Pneumonia    RBBB    RBBB (right bundle branch block with left anterior fascicular block)    Seasonal allergies    Severe aortic stenosis    Sleep apnea    does not wear CPAP   Smoker    Spinal stenosis at L4-L5 level    Tobacco abuse    Tobacco use disorder    Past Surgical History:  Procedure Laterality Date   ANAL FISTULECTOMY  09/25/2011   AORTA - BILATERAL FEMORAL ARTERY BYPASS GRAFT N/A 10/03/2020   Procedure: AORTA BIFEMORAL BYPASS GRAFT USING A HEMASHIELD GOLD BIFURCATED 14 X 7mm GRAFT AND INFERIOR MESENTERIC ARTERY REIMPLANTATION;  Surgeon: Myra Gianotti,  Fran Lowes, MD;  Location: [facility] OR;  Service: Vascular;  Laterality: N/A;   AORTIC VALVE REPLACEMENT N/A 11/24/2019   Procedure: AORTIC VALVE REPLACEMENT (AVR) using INSPIRIS Resilia 23 MM Bioprosthetic Aortic Valve.;  Surgeon: Alleen Borne, MD;  Location: [facility] OR;  Service: Open Heart Surgery;  Laterality: N/A;   APPLICATION OF WOUND VAC Left 10/17/2020   Procedure: APPLICATION OF WOUND VAC;  Surgeon: Nada Libman, MD;  Location: [facility] OR;  Service: Vascular;  Laterality: Left;   BACK SURGERY     2015   CATARACT EXTRACTION, BILATERAL  2022   COLONOSCOPY   2009   MS-F/V-mov(exc)-HPP   CORONARY ARTERY BYPASS GRAFT N/A 11/24/2019   Procedure: CORONARY ARTERY BYPASS GRAFTING (CABG) using LIMA to LAD.;  Surgeon: Alleen Borne, MD;  Location: [facility] OR;  Service: Open Heart Surgery;  Laterality: N/A;   ELBOW SURGERY     bilaterally for cubital tunnel   ENDARTERECTOMY N/A 10/03/2020   Procedure: AORTIC ENDARTERECTOMY;  Surgeon: Nada Libman, MD;  Location: [facility] OR;  Service: Vascular;  Laterality: N/A;   ENDARTERECTOMY FEMORAL Bilateral 10/03/2020   Procedure: BILATERAL FEMORAL ENDARTERECTOMY;  Surgeon: Nada Libman, MD;  Location: [facility] OR;  Service: Vascular;  Laterality: Bilateral;   HERNIA REPAIR     with mesh   INCISION AND DRAINAGE Left 01/02/2021   Procedure: INCISION AND DRAINAGE LEFT GROIN WITH STIMULAN BEADS;  Surgeon: Nada Libman, MD;  Location: [facility] OR;  Service: Vascular;  Laterality: Left;   INCISION AND DRAINAGE OF WOUND Left 10/17/2020   Procedure: EXPLORATION LEFT GROIN WOUND;  Surgeon: Nada Libman, MD;  Location: [facility] OR;  Service: Vascular;  Laterality: Left;   MAXIMUM ACCESS (MAS)POSTERIOR LUMBAR INTERBODY FUSION (PLIF) 1 LEVEL N/A 10/25/2014   Procedure: LUMBAR FOUR TO FIVE MAXIMUM ACCESS (MAS) POSTERIOR LUMBAR INTERBODY FUSION (PLIF) 1 LEVEL;  Surgeon: Tia Alert, MD;  Location: [facility] NEURO ORS;  Service: Neurosurgery;  Laterality: N/A;   RIGHT/LEFT HEART CATH AND CORONARY ANGIOGRAPHY N/A 11/10/2019   Procedure: RIGHT/LEFT HEART CATH AND CORONARY ANGIOGRAPHY;  Surgeon: Swaziland, [name], MD;  Location: [facility] INVASIVE CV LAB;  Service: Cardiovascular;  Laterality: N/A;   SKIN BIOPSY Left 10/12/2018   shave forehead Hypertrophic actinic kertosis with features of a verruca   TEE WITHOUT CARDIOVERSION N/A 11/24/2019   Procedure: TRANSESOPHAGEAL ECHOCARDIOGRAM (TEE);  Surgeon: Alleen Borne, MD;  Location: [facility] OR;  Service: Open Heart Surgery;  Laterality: N/A;   TONSILLECTOMY  age 23   VASECTOMY     X 2   VASECTOMY REVERSAL        A IV Location/Drains/Wounds Patient Lines/Drains/Airways Status     Active Line/Drains/Airways     Name Placement date Placement time Site Days   Peripheral IV 10/05/23 20 G Anterior;Left;Proximal Forearm 10/05/23  1945  Forearm  less than 1            Intake/Output Last 24 hours No intake or output data in the 24 hours ending 10/05/23 2240  Labs/Imaging Results for orders placed or performed during the hospital encounter of 10/05/23 (from the past 48 hour(s))  Basic metabolic panel     Status: None   Collection Time: 10/05/23  7:11 PM  Result Value Ref Range   Sodium 141 135 - 145 mmol/L   Potassium 4.5 3.5 - 5.1 mmol/L   Chloride 102 98 - 111 mmol/L   CO2 27 22 - 32 mmol/L   Glucose, Bld 94 70 - 99 mg/dL  Comment: Glucose reference range applies only to samples taken after fasting for at least 8 hours.   BUN 18 8 - 23 mg/dL   Creatinine, Ser 0.98 0.61 - 1.24 mg/dL   Calcium 9.4 8.9 - 11.9 mg/dL   GFR, Estimated >14 >78 mL/min    Comment: (NOTE) Calculated using the CKD-EPI Creatinine Equation (2021)    Anion gap 12 5 - 15    Comment: Performed at [facility], [address] [place], [place] [address]  CBC with Differential     Status: Abnormal   Collection Time: 10/05/23  7:11 PM  Result Value Ref Range   WBC 6.4 4.0 - 10.5 K/uL   RBC 4.69 4.22 - 5.81 MIL/uL   Hemoglobin 14.2 13.0 - 17.0 g/dL   HCT 13.0 86.5 - 78.4 %   MCV 92.3 80.0 - 100.0 fL   MCH 30.3 26.0 - 34.0 pg   MCHC 32.8 30.0 - 36.0 g/dL   RDW 69.6 29.5 - 28.4 %   Platelets 143 (L) 150 - 400 K/uL   nRBC 0.0 0.0 - 0.2 %   Neutrophils Relative % 43 %   Neutro Abs 2.7 1.7 - 7.7 K/uL   Lymphocytes Relative 35 %   Lymphs Abs 2.2 0.7 - 4.0 K/uL   Monocytes Relative 15 %   Monocytes Absolute 1.0 0.1 - 1.0 K/uL   Eosinophils Relative 6 %   Eosinophils Absolute 0.4 0.0 - 0.5 K/uL   Basophils Relative 1 %   Basophils Absolute 0.1 0.0 - 0.1 K/uL   Immature Granulocytes 0 %   Abs Immature  Granulocytes 0.02 0.00 - 0.07 K/uL    Comment: Performed at [facility], [address], [place], [place] [address]  Troponin I (High Sensitivity)     Status: None   Collection Time: 10/05/23  7:11 PM  Result Value Ref Range   Troponin I (High Sensitivity) 16 <18 ng/L    Comment: (NOTE) Elevated high sensitivity troponin I (hsTnI) values and significant  changes across serial measurements may suggest ACS but many other  chronic and acute conditions are known to elevate hsTnI results.  Refer to the "Links" section for chest pain algorithms and additional  guidance. Performed at [facility], [address] [place], [place] [address]   Troponin I (High Sensitivity)     Status: None   Collection Time: 10/05/23  9:38 PM  Result Value Ref Range   Troponin I (High Sensitivity) 15 <18 ng/L    Comment: (NOTE) Elevated high sensitivity troponin I (hsTnI) values and significant  changes across serial measurements may suggest ACS but many other  chronic and acute conditions are known to elevate hsTnI results.  Refer to the "Links" section for chest pain algorithms and additional  guidance. Performed at [facility], [address] [place], [place] [address]    No results found.  Pending Labs Unresulted Labs (From admission, onward)     Start     Ordered   10/12/23 0500  Creatinine, serum  (enoxaparin (LOVENOX)    CrCl >/= 30 ml/min)  Weekly,   R     Comments: while on enoxaparin therapy    10/05/23 2239   10/06/23 0500  Comprehensive metabolic panel  Tomorrow morning,   R        10/05/23 2239   10/06/23 0500  Protime-INR  Tomorrow morning,   R        10/05/23 2239   10/06/23  0500  TSH  Tomorrow morning,   R        10/05/23 2239   10/05/23 2240  CBC  (enoxaparin (LOVENOX)    CrCl >/= 30 ml/min)  Once,   R       Comments: Baseline for enoxaparin therapy IF NOT ALREADY DRAWN.  Notify MD if PLT < 100 K.    10/05/23 2239   10/05/23 2240  Creatinine, serum   (enoxaparin (LOVENOX)    CrCl >/= 30 ml/min)  Once,   R       Comments: Baseline for enoxaparin therapy IF NOT ALREADY DRAWN.    10/05/23 2239            Vitals/Pain Today's Vitals   10/05/23 1934 10/05/23 2145 10/05/23 2215 10/05/23 2230  BP: (!) 165/67 (!) 141/67 (!) 145/62 (!) 146/71  Pulse: (!) 48 (!) 46 (!) 44 (!) 47  Resp: 15 14 15 15   Temp:      TempSrc:      SpO2: 100% 97% 95% 97%  PainSc:        Isolation Precautions No active isolations  Medications Medications  amLODipine (NORVASC) tablet 10 mg (has no administration in time range)  atorvastatin (LIPITOR) tablet 80 mg (has no administration in time range)  lisinopril-hydrochlorothiazide (ZESTORETIC) 20-12.5 MG per tablet 1 tablet (has no administration in time range)  finasteride (PROSCAR) tablet 5 mg (has no administration in time range)  divalproex (DEPAKOTE ER) 24 hr tablet 1,000 mg (has no administration in time range)  lamoTRIgine (LAMICTAL) tablet 200 mg (has no administration in time range)  albuterol (VENTOLIN HFA) 108 (90 Base) MCG/ACT inhaler 1 puff (has no administration in time range)  mometasone-formoterol (DULERA) 200-5 MCG/ACT inhaler 2 puff (has no administration in time range)  enoxaparin (LOVENOX) injection 40 mg (has no administration in time range)  acetaminophen (TYLENOL) tablet 650 mg (has no administration in time range)    Or  acetaminophen (TYLENOL) suppository 650 mg (has no administration in time range)    Mobility walks     Focused Assessments    R Recommendations: See Admitting Provider Note  Report given to:   Additional Notes: Call or epic message for any additional details

## 2023-10-05 NOTE — Telephone Encounter (Signed)
Spoke with patient and wife she will drive him to [facility].

## 2023-10-05 NOTE — ED Triage Notes (Signed)
Pt to ED POV from home. Pt states his cardiologist has pt wearing a zio monitor and cardiologist called pt and told him to come to ED for complete heart block. Pt had CABG and valve replacement in 2020. Pt denies any CP but does endorse dizziness and fatigue. Pt denies any SOB.

## 2023-10-06 ENCOUNTER — Encounter (HOSPITAL_COMMUNITY)
Admission: EM | Disposition: A | Discharge status: Home / Self Care | Payer: Self-pay | Source: Home / Self Care | Attending: Cardiology

## 2023-10-06 ENCOUNTER — Inpatient Hospital Stay (HOSPITAL_COMMUNITY): Payer: PPO

## 2023-10-06 ENCOUNTER — Other Ambulatory Visit: Payer: Self-pay

## 2023-10-06 ENCOUNTER — Ambulatory Visit: Payer: PPO | Admitting: Physical Therapy

## 2023-10-06 DIAGNOSIS — I452 Bifascicular block: Secondary | ICD-10-CM | POA: Diagnosis not present

## 2023-10-06 DIAGNOSIS — R001 Bradycardia, unspecified: Secondary | ICD-10-CM

## 2023-10-06 DIAGNOSIS — I441 Atrioventricular block, second degree: Secondary | ICD-10-CM | POA: Diagnosis not present

## 2023-10-06 HISTORY — PX: PACEMAKER IMPLANT: EP1218

## 2023-10-06 LAB — COMPREHENSIVE METABOLIC PANEL
ALT: 28 U/L (ref 0–44)
AST: 24 U/L (ref 15–41)
Albumin: 3.4 g/dL — ABNORMAL LOW (ref 3.5–5.0)
Alkaline Phosphatase: 47 U/L (ref 38–126)
Anion gap: 8 (ref 5–15)
BUN: 13 mg/dL (ref 8–23)
CO2: 27 mmol/L (ref 22–32)
Calcium: 8.9 mg/dL (ref 8.9–10.3)
Chloride: 105 mmol/L (ref 98–111)
Creatinine, Ser: 1.01 mg/dL (ref 0.61–1.24)
GFR, Estimated: >60 mL/min (ref >60)
Glucose, Bld: 99 mg/dL (ref 70–99)
Potassium: 4.5 mmol/L (ref 3.5–5.1)
Sodium: 140 mmol/L (ref 135–145)
Total Bilirubin: 0.7 mg/dL (ref 0.3–1.2)
Total Protein: 6.6 g/dL (ref 6.5–8.1)

## 2023-10-06 LAB — ECHOCARDIOGRAM COMPLETE
AR max vel: 1.67 cm2
AV Area VTI: 1.75 cm2
AV Area mean vel: 1.72 cm2
AV Mean grad: 16 mm[Hg]
AV Peak grad: 30.3 mm[Hg]
Ao pk vel: 2.75 m/s
Area-P 1/2: 2.22 cm2
Height: 65 in
MV VTI: 2.26 cm2
S' Lateral: 2.1 cm
Weight: 2112.89 [oz_av]

## 2023-10-06 LAB — PROTIME-INR
INR: 1.1 (ref 0.8–1.2)
Prothrombin Time: 14.1 s (ref 11.4–15.2)

## 2023-10-06 LAB — SURGICAL PCR SCREEN
MRSA, PCR: NEGATIVE
Staphylococcus aureus: NEGATIVE

## 2023-10-06 SURGERY — PACEMAKER IMPLANT

## 2023-10-06 MED ORDER — LIDOCAINE HCL (PF) 1 % IJ SOLN
INTRAMUSCULAR | Status: DC | PRN
  Administered 2023-10-06: 55 mL

## 2023-10-06 MED ORDER — CHLORHEXIDINE GLUCONATE 4 % EX SOLN
60.0000 mL | Freq: Once | CUTANEOUS | Status: DC
  Filled 2023-10-06: qty 15

## 2023-10-06 MED ORDER — MIDAZOLAM HCL 5 MG/5ML IJ SOLN
INTRAMUSCULAR | Status: DC | PRN
  Administered 2023-10-06: 1 mg via INTRAVENOUS

## 2023-10-06 MED ORDER — SODIUM CHLORIDE 0.9 % IV SOLN
INTRAVENOUS | Status: AC
  Filled 2023-10-06: qty 2

## 2023-10-06 MED ORDER — LIDOCAINE HCL (PF) 1 % IJ SOLN
INTRAMUSCULAR | Status: AC
  Filled 2023-10-06: qty 60

## 2023-10-06 MED ORDER — CEFAZOLIN SODIUM-DEXTROSE 1-4 GM/50ML-% IV SOLN
1.0000 g | Freq: Four times a day (QID) | INTRAVENOUS | Status: AC
  Administered 2023-10-06 – 2023-10-07 (×3): 1 g via INTRAVENOUS
  Filled 2023-10-06 (×4): qty 50

## 2023-10-06 MED ORDER — SODIUM CHLORIDE 0.9 % IV SOLN
INTRAVENOUS | Status: DC | PRN
  Administered 2023-10-06: 80 mg

## 2023-10-06 MED ORDER — FENTANYL CITRATE (PF) 100 MCG/2ML IJ SOLN
INTRAMUSCULAR | Status: AC
  Filled 2023-10-06: qty 2

## 2023-10-06 MED ORDER — SODIUM CHLORIDE 0.9% FLUSH
3.0000 mL | Freq: Two times a day (BID) | INTRAVENOUS | Status: DC
  Administered 2023-10-06 – 2023-10-07 (×3): 3 mL via INTRAVENOUS

## 2023-10-06 MED ORDER — SODIUM CHLORIDE 0.9 % IV SOLN: 250.0000 mL | INTRAVENOUS | Status: DC

## 2023-10-06 MED ORDER — CEFAZOLIN SODIUM-DEXTROSE 2-4 GM/100ML-% IV SOLN
INTRAVENOUS | Status: AC
  Administered 2023-10-06: 2 g via INTRAVENOUS
  Filled 2023-10-06: qty 100

## 2023-10-06 MED ORDER — FENTANYL CITRATE (PF) 100 MCG/2ML IJ SOLN
INTRAMUSCULAR | Status: DC | PRN
  Administered 2023-10-06: 25 ug via INTRAVENOUS

## 2023-10-06 MED ORDER — CEFAZOLIN SODIUM-DEXTROSE 2-4 GM/100ML-% IV SOLN: 2.0000 g | INTRAVENOUS | Status: AC

## 2023-10-06 MED ORDER — CHLORHEXIDINE GLUCONATE 4 % EX SOLN: 60.0000 mL | Freq: Once | CUTANEOUS | Status: DC

## 2023-10-06 MED ORDER — MIDAZOLAM HCL 2 MG/2ML IJ SOLN
INTRAMUSCULAR | Status: AC
  Filled 2023-10-06: qty 2

## 2023-10-06 MED ORDER — ONDANSETRON HCL 4 MG/2ML IJ SOLN: 4.0000 mg | Freq: Four times a day (QID) | INTRAMUSCULAR | Status: DC | PRN

## 2023-10-06 MED ORDER — IPRATROPIUM-ALBUTEROL 0.5-2.5 (3) MG/3ML IN SOLN
3.0000 mL | Freq: Four times a day (QID) | RESPIRATORY_TRACT | Status: DC
  Administered 2023-10-06 – 2023-10-07 (×4): 3 mL via RESPIRATORY_TRACT
  Filled 2023-10-06 (×4): qty 3

## 2023-10-06 MED ORDER — HEPARIN (PORCINE) IN NACL 1000-0.9 UT/500ML-% IV SOLN
INTRAVENOUS | Status: DC | PRN
  Administered 2023-10-06: 500 mL

## 2023-10-06 MED ORDER — SODIUM CHLORIDE 0.9 % IV SOLN: INTRAVENOUS | Status: DC

## 2023-10-06 SURGICAL SUPPLY — 13 items
CABLE SURGICAL S-101-97-12 (CABLE) ×1 IMPLANT
CATH CPS LOCATOR 3D MED (CATHETERS) IMPLANT
HELIX LOCKING TOOL (MISCELLANEOUS) ×1
LEAD ULTIPACE 52 LPA1231/52 (Lead) IMPLANT
LEAD ULTIPACE 65 LPA1231/65 (Lead) IMPLANT
PACEMAKER ASSURITY DR-RF (Pacemaker) IMPLANT
PAD DEFIB RADIO PHYSIO CONN (PAD) ×1 IMPLANT
SHEATH 7FR PRELUDE SNAP 13 (SHEATH) IMPLANT
SHEATH 9FR PRELUDE SNAP 13 (SHEATH) IMPLANT
SLITTER AGILIS HISPRO (INSTRUMENTS) IMPLANT
TOOL HELIX LOCKING (MISCELLANEOUS) IMPLANT
TRAY PACEMAKER INSERTION (PACKS) ×1 IMPLANT
WIRE HI TORQ VERSACORE-J 145CM (WIRE) IMPLANT

## 2023-10-06 NOTE — Progress Notes (Signed)
Echocardiogram 2D Echocardiogram has been performed.  Warren Lacy Alise Calais RDCS 10/06/2023, 11:38 AM

## 2023-10-06 NOTE — Progress Notes (Addendum)
Progress Note  Patient Name: Gregory [name] Date of Encounter: 10/06/2023  Primary Cardiologist: Peter Swaziland, MD  Subjective   Feeling well this morning. Recounts events of last few days, intermittent episodes of dizziness accompanied by HR dropping on smartwatch. Some wheezing noted on exam - reports sometimes this flares up working in yard, typically uses home inhaler (listed as Advair and albuterol PRN) - substituted for Dulera and albuterol neb this AM. Has mild productive cough and mild dyspnea but otherwise no acute infective symptoms. No CP. HR 40s presently, sinus brady on exam.  Discussed preliminarily with EP - keep NPO until they see.  Inpatient Medications    Scheduled Meds:  amLODipine  10 mg Oral Daily   atorvastatin  80 mg Oral Daily   divalproex  1,000 mg Oral QPM   finasteride  5 mg Oral QHS   lisinopril  20 mg Oral Daily   And   hydrochlorothiazide  12.5 mg Oral Daily   lamoTRIgine  200 mg Oral q morning   mometasone-formoterol  2 puff Inhalation BID   Continuous Infusions:  PRN Meds: acetaminophen **OR** acetaminophen, albuterol   Vital Signs    Vitals:   10/05/23 2245 10/05/23 2340 10/06/23 0400 10/06/23 0845  BP: (!) 153/77 (!) 156/71 131/67 135/68  Pulse: (!) 46 (!) 50 (!) 44 (!) 49  Resp: 12 17 14 13   Temp:  97.6 F (36.4 C) 97.7 F (36.5 C) 98 F (36.7 C)  TempSrc:  Oral Oral Oral  SpO2: 97% 97% 96% 96%  Weight:  59.9 kg    Height:  5\' 5"  (1.651 m)      Intake/Output Summary (Last 24 hours) at 10/06/2023 0910 Last data filed at 10/06/2023 0433 Gross per 24 hour  Intake 360 ml  Output 450 ml  Net -90 ml      10/05/2023   11:40 PM 09/28/2023    2:29 PM 09/02/2023   10:11 AM  Last 3 Weights  Weight (lbs) 132 lb 0.9 oz 136 lb 12.8 oz 128 lb 3.2 oz  Weight (kg) 59.9 kg 62.052 kg 58.151 kg     Telemetry    SB 40s - Personally Reviewed  Physical Exam   GEN: No acute distress.  HEENT: Normocephalic, atraumatic, sclera  non-icteric. Neck: No JVD or bruits. Cardiac: Reg rhythm, bradycardic 40s, no murmurs, rubs, or gallops.  Respiratory: Diffuse scattered wheezing, not audible without stethoscope but very audible to auscultation bilaterally. Breathing is unlabored. GI: Soft, nontender, non-distended, BS +x 4. MS: no deformity. Extremities: No clubbing or cyanosis. No edema. Distal pedal pulses are 2+ and equal bilaterally. Neuro:  AAOx3. Follows commands. Psych:  Responds to questions appropriately with a normal affect.  Labs    High Sensitivity Troponin:   Recent Labs  Lab 10/05/23 1911 10/05/23 2138  TROPONINIHS 16 15      Cardiac EnzymesNo results for input(s): "TROPONINI" in the last 168 hours. No results for input(s): "TROPIPOC" in the last 168 hours.   Chemistry Recent Labs  Lab 10/05/23 1911 10/06/23 0452  NA 141 140  K 4.5 4.5  CL 102 105  CO2 27 27  GLUCOSE 94 99  BUN 18 13  CREATININE 1.06 1.01  CALCIUM 9.4 8.9  PROT  --  6.6  ALBUMIN  --  3.4*  AST  --  24  ALT  --  28  ALKPHOS  --  47  BILITOT  --  0.7  GFRNONAA >60 >60  ANIONGAP 12 8  Hematology Recent Labs  Lab 10/05/23 1911  WBC 6.4  RBC 4.69  HGB 14.2  HCT 43.3  MCV 92.3  MCH 30.3  MCHC 32.8  RDW 15.3  PLT 143*    BNPNo results for input(s): "BNP", "PROBNP" in the last 168 hours.   DDimer No results for input(s): "DDIMER" in the last 168 hours.   Radiology    No results found.  Cardiac Studies   2d echo 11/2019   1. Left ventricular ejection fraction, by visual estimation, is 70 to  75%. The left ventricle has normal function. There is no left ventricular  hypertrophy.   2. The left ventricle has no regional wall motion abnormalities.   3. Global right ventricle has normal systolic function.The right  ventricular size is normal. No increase in right ventricular wall  thickness.   4. Left atrial size was normal.   5. Right atrial size was normal.   6. The mitral valve is normal in  structure. No evidence of mitral valve  regurgitation. No evidence of mitral stenosis.   7. The tricuspid valve is normal in structure. Tricuspid valve  regurgitation is trivial.   8. Aortic valve regurgitation is not visualized.   9. Aortic Valve Replacement-56mm Biosprosthetic Peak velocity 2.8m/s,  mean .  10. The pulmonic valve was normal in structure. Pulmonic valve  regurgitation is not visualized.  11. Mildly elevated pulmonary artery systolic pressure.  12. The inferior vena cava is normal in size with greater than 50%  respiratory variability, suggesting right atrial pressure of 3 mmHg.   Patient Profile     72 y.o. male with obstructive CAD s/p CABG 11/2019 and severe aortic valve stenosis s/p AVR (23mm Biosprosthetic at time of CABG), PAD s/p aortofemoral bypass grafting, known RBBB+LPFB (following CABG/AVR), syncope 08/2023 complicated by SAH/skull fracture, sinus bradycardia, hypertension, hyperlipidemia, ongoing tobacco use   Assessment & Plan    1. Bradycardia/high grave AV block, history of syncope, history of RBBB+LAFB - see below for example tracing of high grade AVB transmitted by monitor - EP consult pending - keep NPO until they see - not on any AVN blocking agents - echo pending - recent TSH wnl, potassium normal     2. CAD s/p CABG and severe AS s/p bioprosthetic AVR 11/2019 - no longer on ASA due to [facility] in 08/2023 - hsTroponins negative, not reflective of ACS - repeat echo pending  3. PAD - continue statin  4. HTN - BP acceptable, would not advocate for stricter control at this time  5. Bipolar d/o - on lamictal/Depakote  6. Asthma - known issue for patient, periodically flares with wheezing - Dulera substituted for home Advair - will get port CXR - recommend neb - discussed with nurse to go ahead and give - hold on abx given afebrile, normal WBC, pending CXR review - addendum: per notes patient declined Dulera because he does not want to  pay for this, nurse indicates patient also used home albuterol inhaler prior to getting neb this AM. Advised nurse to relay to patient to hold off on taking home meds so we can track and chart accurately on what is given here. Will switch albuterol neb to Duonebs since he does not want to take [facility] here. CXR nonacute. Continue supportive care.  For questions or updates, please contact [facility] Please consult [email or web address] for contact info under Cardiology/STEMI.  Signed, Laurann Montana, PA-C 10/06/2023, 9:10 AM    History and all data above reviewed.  Patient examined.  I agree with the findings as above.    The patient has had wheezing consistent with his long standing history of asthma.  He has had albuterol nebulizers treatment with some help.  The patient exam reveals COR:RRR  ,  Lungs: Diffuse exp wheezing  ,  Abd: Positive bowel sounds, no rebound no guarding, Ext No edema  .  All available labs, radiology testing, previous records reviewed. Agree with documented assessment and plan.  Asthma:  He was using some meds from home and I talked to him about the the nebulizers that are now a standing order and the PRN MDI.  CXR without acute disease.  HB:   Pacemaker as planned.   Fayrene Fearing Daphyne Miguez  5:40 PM  10/06/2023

## 2023-10-06 NOTE — Interval H&P Note (Signed)
History and Physical Interval Note:  10/06/2023 4:18 PM  Gregory [name]  has presented today for surgery, with the diagnosis of heart block.  The various methods of treatment have been discussed with the patient and family. After consideration of risks, benefits and other options for treatment, the patient has consented to  Procedure(s): PACEMAKER IMPLANT (N/A) as a surgical intervention.  The patient's history has been reviewed, patient examined, no change in status, stable for surgery.  I have reviewed the patient's chart and labs.  Questions were answered to the patient's satisfaction.     Tzipora Mcinroy [employer]

## 2023-10-06 NOTE — Consult Note (Addendum)
Cardiology Consultation   Patient ID: [name] MRN: [id number]; DOB: 05/24/1951  Admit date: 10/05/2023 Date of Consult: 10/06/2023  PCP:  Ronnald Nian, MD   [facility] Providers Cardiologist:  Peter Swaziland, MD  }     Patient Profile:   Gregory [name] is a 72 y.o. male with a hx of CAD/VHD s/p CABG/AVR (bioprosthetic) 2020 (did have post-op CHB though resolved without need for PPM,  HTN, HLD, PVD (b/l iliac disease s/p bypass),  who is being seen 10/06/2023 for the evaluation of AV block at the request of Dr. Antoine Poche.  History of Present Illness:   [name] this summer noted some lower HRs evaluation into this included cardiac monitor in 05/2023 that showed normal sinus rhythm with rare PACs and PVC. There was bradycardia during sleeping hours.   He was admitted to [facility] in September after a syncopal event resulting in head trauma, Neurosurgery was consulted and no acute or urgent interventions were needed.  F/u with cardiology with ongoing dizziness, weak spells, planned for a repeat monitor and planned for EP consult  He was admitted last night after his monitor alerted for high AV block with rates to the 30's, associated with symptoms.   LABS K+ 4.5, 4.5 BUN/Creat 18/1.06 > 1.01 WBC 6.4 H/H 14/43 Plts 143  Zio monitor tracings reviewed 09:27 13;27  17:59 AV block (appears Mobitz II) with 2:1 and 3:1 conduction, rates about 30  Home meds reviewed On no nodal blocking agents  He denies any CP, no palpitations Though by his watch he notes his HRs can be all over the place 40 or so to low 100s He reports that he gets lightheaded, weak, seated or standing, when standing he has to sit, when seated he is glad he is not standing Sometimes he thinks he may faint but doesn't He has not fainted again since Sept/head injury   Past Medical History:  Diagnosis Date   Alcohol abuse    Aortic stenosis    Arthritis    Asthma    exercise  indused or pollen-uses inhaler dail yand has rescue inhaler if needed   Bipolar disorder (HCC)    CAD (coronary artery disease)    coronary calcifications 08/2013 CTA   Carotid artery occlusion    Cataract    bilateral sx   Chronic back pain    Claudication (HCC)    Complication of anesthesia 10/16/2020   "had a panic attack when the mask was put on me"" I do not think I was given anything  before, I need something to calm me down"   Depression    Family history of skin cancer    Heart murmur    as an infant   Hepatitis    h/o 1970,doesn't remember which type,1990 epstain-barr   Herpes zoster without complication 09/05/2019   Hyperlipidemia    Hypertension    Neuromuscular disorder (HCC)    Obsessive compulsive disorder    PAD (peripheral artery disease) (HCC)    Peripheral neuropathy    Pneumonia    RBBB    RBBB (right bundle branch block with left anterior fascicular block)    Seasonal allergies    Severe aortic stenosis    Sleep apnea    does not wear CPAP   Smoker    Spinal stenosis at L4-L5 level    Tobacco abuse    Tobacco use disorder     Past Surgical History:  Procedure Laterality Date  ANAL FISTULECTOMY  09/25/2011   AORTA - BILATERAL FEMORAL ARTERY BYPASS GRAFT N/A 10/03/2020   Procedure: AORTA BIFEMORAL BYPASS GRAFT USING A HEMASHIELD GOLD BIFURCATED 14 X 7mm GRAFT AND INFERIOR MESENTERIC ARTERY REIMPLANTATION;  Surgeon: Nada Libman, MD;  Location: [facility] OR;  Service: Vascular;  Laterality: N/A;   AORTIC VALVE REPLACEMENT N/A 11/24/2019   Procedure: AORTIC VALVE REPLACEMENT (AVR) using INSPIRIS Resilia 23 MM Bioprosthetic Aortic Valve.;  Surgeon: Alleen Borne, MD;  Location: [facility] OR;  Service: Open Heart Surgery;  Laterality: N/A;   APPLICATION OF WOUND VAC Left 10/17/2020   Procedure: APPLICATION OF WOUND VAC;  Surgeon: Nada Libman, MD;  Location: [facility] OR;  Service: Vascular;  Laterality: Left;   BACK SURGERY     2015   CATARACT EXTRACTION, BILATERAL   2022   COLONOSCOPY  2009   MS-F/V-mov(exc)-HPP   CORONARY ARTERY BYPASS GRAFT N/A 11/24/2019   Procedure: CORONARY ARTERY BYPASS GRAFTING (CABG) using LIMA to LAD.;  Surgeon: Alleen Borne, MD;  Location: [facility] OR;  Service: Open Heart Surgery;  Laterality: N/A;   ELBOW SURGERY     bilaterally for cubital tunnel   ENDARTERECTOMY N/A 10/03/2020   Procedure: AORTIC ENDARTERECTOMY;  Surgeon: Nada Libman, MD;  Location: [facility] OR;  Service: Vascular;  Laterality: N/A;   ENDARTERECTOMY FEMORAL Bilateral 10/03/2020   Procedure: BILATERAL FEMORAL ENDARTERECTOMY;  Surgeon: Nada Libman, MD;  Location: [facility] OR;  Service: Vascular;  Laterality: Bilateral;   HERNIA REPAIR     with mesh   INCISION AND DRAINAGE Left 01/02/2021   Procedure: INCISION AND DRAINAGE LEFT GROIN WITH STIMULAN BEADS;  Surgeon: Nada Libman, MD;  Location: [facility] OR;  Service: Vascular;  Laterality: Left;   INCISION AND DRAINAGE OF WOUND Left 10/17/2020   Procedure: EXPLORATION LEFT GROIN WOUND;  Surgeon: Nada Libman, MD;  Location: [facility] OR;  Service: Vascular;  Laterality: Left;   MAXIMUM ACCESS (MAS)POSTERIOR LUMBAR INTERBODY FUSION (PLIF) 1 LEVEL N/A 10/25/2014   Procedure: LUMBAR FOUR TO FIVE MAXIMUM ACCESS (MAS) POSTERIOR LUMBAR INTERBODY FUSION (PLIF) 1 LEVEL;  Surgeon: Tia Alert, MD;  Location: [facility] NEURO ORS;  Service: Neurosurgery;  Laterality: N/A;   RIGHT/LEFT HEART CATH AND CORONARY ANGIOGRAPHY N/A 11/10/2019   Procedure: RIGHT/LEFT HEART CATH AND CORONARY ANGIOGRAPHY;  Surgeon: Swaziland, [name], MD;  Location: [facility]IVE CV LAB;  Service: Cardiovascular;  Laterality: N/A;   SKIN BIOPSY Left 10/12/2018   shave forehead Hypertrophic actinic kertosis with features of a verruca   TEE WITHOUT CARDIOVERSION N/A 11/24/2019   Procedure: TRANSESOPHAGEAL ECHOCARDIOGRAM (TEE);  Surgeon: Alleen Borne, MD;  Location: [facility] OR;  Service: Open Heart Surgery;  Laterality: N/A;   TONSILLECTOMY  age 17   VASECTOMY     X 2    VASECTOMY REVERSAL       Home Medications:  Prior to Admission medications   Medication Sig Start Date End Date Taking? Authorizing Provider  albuterol (VENTOLIN HFA) 108 (90 Base) MCG/ACT inhaler INHALE TWO PUFFS BY MOUTH EVERY 6 HOURS AS NEEDED FOR WHEEZING 04/16/23  Yes Ronnald Nian, MD  amLODipine (NORVASC) 10 MG tablet Take 1 tablet (10 mg total) by mouth daily. 04/16/23  Yes Ronnald Nian, MD  amoxicillin (AMOXIL) 500 MG capsule Take 4 capsules (2,000 mg total) by mouth once as needed for up to 1 dose. Take 4 tablets one hour before dental work 06/02/22  Yes Swaziland, [name], MD  atorvastatin (LIPITOR) 80 MG tablet Take 1  tablet (80 mg total) by mouth daily. 04/16/23  Yes Ronnald Nian, MD  azelastine (ASTELIN) 0.1 % nasal spray Place 1-2 sprays into both nostrils 2 (two) times daily as needed (nasal drainage). Use in each nostril as directed 04/16/23  Yes Ronnald Nian, MD  divalproex (DEPAKOTE ER) 500 MG 24 hr tablet Take 1 tablet (500 mg total) by mouth 3 (three) times daily. Patient taking differently: Take 1,000 mg by mouth every evening. 04/16/23  Yes Ronnald Nian, MD  finasteride (PROSCAR) 5 MG tablet Take 1 tablet (5 mg total) by mouth daily. 04/16/23  Yes Ronnald Nian, MD  fluticasone-salmeterol (ADVAIR DISKUS) 500-50 MCG/ACT AEPB Inhale 1 puff into the lungs in the morning and at bedtime. 04/16/23  Yes Ronnald Nian, MD  lamoTRIgine (LAMICTAL) 200 MG tablet Take 1 tablet (200 mg total) by mouth every morning. 04/16/23  Yes Ronnald Nian, MD  lisinopril-hydrochlorothiazide (ZESTORETIC) 20-12.5 MG tablet Take 1 tablet by mouth every morning. 04/16/23  Yes Ronnald Nian, MD  nicotine polacrilex (COMMIT) 4 MG lozenge Take 4 mg by mouth as needed for smoking cessation.   Yes [provider]    Inpatient Medications: Scheduled Meds:  amLODipine  10 mg Oral Daily   atorvastatin  80 mg Oral Daily   divalproex  1,000 mg Oral QPM   finasteride  5 mg Oral QHS    lisinopril  20 mg Oral Daily   And   hydrochlorothiazide  12.5 mg Oral Daily   lamoTRIgine  200 mg Oral q morning   mometasone-formoterol  2 puff Inhalation BID   Continuous Infusions:  PRN Meds: acetaminophen **OR** acetaminophen, albuterol  Allergies:    Allergies  Allergen Reactions   Codeine Anaphylaxis   Dihydrocodeine Anaphylaxis    Social History:   Social History   Socioeconomic History   Marital status: Married    Spouse name: Not on file   Number of children: 6   Years of education: Not on file   Highest education level: Not on file  Occupational History   Not on file  Tobacco Use   Smoking status: Every Day    Current packs/day: 0.50    Average packs/day: 0.5 packs/day for 25.0 years (12.5 ttl pk-yrs)    Types: Cigarettes   Smokeless tobacco: Never   Tobacco comments:    Pt had stopped smoking X10 years/ currently smoking a 1.5 pack a day   Vaping Use   Vaping status: Never Used  Substance and Sexual Activity   Alcohol use: Not Currently    Comment: 06/26/2014- last drink- AA   Drug use: No    Comment: former alcoholic   Sexual activity: Yes  Other Topics Concern   Not on file  Social History Narrative   Lives in [place] with wife.  Does not routinely exercise.  Sedentary r/t chronic lbp.   Social Determinants of Health   Financial Resource Strain: Low Risk  (04/10/2023)   Overall Financial Resource Strain (CARDIA)    Difficulty of Paying Living Expenses: Not very hard  Food Insecurity: No Food Insecurity (08/26/2023)   Hunger Vital Sign    Worried About Running Out of Food in the Last Year: Never true    Ran Out of Food in the Last Year: Never true  Transportation Needs: No Transportation Needs (08/26/2023)   PRAPARE - Administrator, Civil Service (Medical): No    Lack of Transportation (Non-Medical): No  Physical Activity: Sufficiently Active (04/10/2023)  Exercise Vital Sign    Days of Exercise per Week: 4 days    Minutes of Exercise  per Session: 150+ min  Stress: Stress Concern Present (04/10/2023)   [employer] of Occupational Health - Occupational Stress Questionnaire    Feeling of Stress : To some extent  Social Connections: Unknown (04/10/2023)   Social Connection and Isolation Panel [NHANES]    Frequency of Communication with Friends and Family: Once a week    Frequency of Social Gatherings with Friends and Family: Never    Attends Religious Services: Not on Marketing executive or Organizations: Yes    Attends Banker Meetings: More than 4 times per year    Marital Status: Married  Catering manager Violence: Not At Risk (08/26/2023)   Humiliation, Afraid, Rape, and Kick questionnaire    Fear of Current or Ex-Partner: No    Emotionally Abused: No    Physically Abused: No    Sexually Abused: No    Family History:   Family History  Problem Relation Age of Onset   Colon polyps Mother 83   Heart failure Mother    Stroke Father 50   Cancer Father        skin   Drug abuse Father    Hypertension Brother    Diabetes Brother    Drug abuse Brother    Heart disease Brother        s/p CABG, pacemaker   Cancer Maternal Aunt        skin   Cancer Maternal Grandmother        liver   Colon cancer Neg Hx    Esophageal cancer Neg Hx    Rectal cancer Neg Hx    Stomach cancer Neg Hx      ROS:  Please see the history of present illness.  All other ROS reviewed and negative.     Physical Exam/Data:   Vitals:   10/05/23 2245 10/05/23 2340 10/06/23 0400 10/06/23 0845  BP: (!) 153/77 (!) 156/71 131/67 135/68  Pulse: (!) 46 (!) 50 (!) 44 (!) 49  Resp: 12 17 14 13   Temp:  97.6 F (36.4 C) 97.7 F (36.5 C) 98 F (36.7 C)  TempSrc:  Oral Oral Oral  SpO2: 97% 97% 96% 96%  Weight:  59.9 kg    Height:  5\' 5"  (1.651 m)      Intake/Output Summary (Last 24 hours) at 10/06/2023 0958 Last data filed at 10/06/2023 0433 Gross per 24 hour  Intake 360 ml  Output 450 ml  Net -90 ml       10/05/2023   11:40 PM 09/28/2023    2:29 PM 09/02/2023   10:11 AM  Last 3 Weights  Weight (lbs) 132 lb 0.9 oz 136 lb 12.8 oz 128 lb 3.2 oz  Weight (kg) 59.9 kg 62.052 kg 58.151 kg     Body mass index is 21.98 kg/m.  General:  Well nourished, well developed, in no acute distress HEENT: normal Neck: no JVD Vascular: No carotid bruits; Distal pulses 2+ bilaterally Cardiac:  RRR; 2/6 SM, gallops or rubs Lungs:  CTA b/l, no wheezing, rhonchi or rales  Abd: soft, nontender Ext: no edema Musculoskeletal:  arthritic changes hands Skin: warm and dry  Neuro:  no focal abnormalities noted Psych:  Normal affect   EKG:  The EKG was personally reviewed and demonstrates:    SB 52bpm, RBBB (old)  Telemetry:  Telemetry was personally reviewed and demonstrates:  SR/SB 50's mostly  Relevant CV Studies  Updated echo is ordered, pending   12/13/2019: TTE 1. Left ventricular ejection fraction, by visual estimation, is 70 to  75%. The left ventricle has normal function. There is no left ventricular  hypertrophy.   2. The left ventricle has no regional wall motion abnormalities.   3. Global right ventricle has normal systolic function.The right  ventricular size is normal. No increase in right ventricular wall  thickness.   4. Left atrial size was normal.   5. Right atrial size was normal.   6. The mitral valve is normal in structure. No evidence of mitral valve  regurgitation. No evidence of mitral stenosis.   7. The tricuspid valve is normal in structure. Tricuspid valve  regurgitation is trivial.   8. Aortic valve regurgitation is not visualized.   9. Aortic Valve Replacement-58mm Biosprosthetic Peak velocity 2.84m/s,  mean .  10. The pulmonic valve was normal in structure. Pulmonic valve  regurgitation is not visualized.  11. Mildly elevated pulmonary artery systolic pressure.  12. The inferior vena cava is normal in size with greater than 50%  respiratory variability,  suggesting right atrial pressure of 3 mmHg.    Laboratory Data:  High Sensitivity Troponin:   Recent Labs  Lab 10/05/23 1911 10/05/23 2138  TROPONINIHS 16 15     Chemistry Recent Labs  Lab 10/05/23 1911 10/06/23 0452  NA 141 140  K 4.5 4.5  CL 102 105  CO2 27 27  GLUCOSE 94 99  BUN 18 13  CREATININE 1.06 1.01  CALCIUM 9.4 8.9  GFRNONAA >60 >60  ANIONGAP 12 8    Recent Labs  Lab 10/06/23 0452  PROT 6.6  ALBUMIN 3.4*  AST 24  ALT 28  ALKPHOS 47  BILITOT 0.7   Lipids No results for input(s): "CHOL", "TRIG", "HDL", "LABVLDL", "LDLCALC", "CHOLHDL" in the last 168 hours.  Hematology Recent Labs  Lab 10/05/23 1911  WBC 6.4  RBC 4.69  HGB 14.2  HCT 43.3  MCV 92.3  MCH 30.3  MCHC 32.8  RDW 15.3  PLT 143*   Thyroid No results for input(s): "TSH", "FREET4" in the last 168 hours.  BNPNo results for input(s): "BNP", "PROBNP" in the last 168 hours.  DDimer No results for input(s): "DDIMER" in the last 168 hours.   Radiology/Studies:  No results found.   Assessment and Plan:   Advanced heart block, Mobitz II Symptomatic bradycardia Baseline conduction system disease w/RBBB (that pre-dates his CABG) No reversible causes He Baltazar Pekala need PPM  Discussed zio monitor findings Rational for PPM Implant procedure, potential risks/benefits, he is agreeable Myriah Boggus look towards this afternoon, if schedule allows  Further as per attending cardiology team    Risk Assessment/Risk Scores:    For questions or updates, please contact [facility] Please consult [email or web address] for contact info under    Signed, Sheilah Pigeon, PA-C  10/06/2023 9:58 AM  I have seen and examined this patient with Francis Dowse.  Agree with above, note added to reflect my findings.  patient with a past history as above.  He presented to the hospital after having worn a cardiac monitor that showed evidence of second-degree AV block.  He has a history of syncope with fall and  significant head trauma.  GEN: Well nourished, well developed, in no acute distress  HEENT: normal  Neck: no JVD, carotid bruits, or masses Cardiac: Bradycardic; no murmurs, rubs, or gallops,no edema  Respiratory:  clear to auscultation bilaterally,  normal work of breathing GI: soft, nontender, nondistended, + BS MS: no deformity or atrophy  Skin: warm and dry Neuro:  Strength and sensation are intact Psych: euthymic mood, full affect   Second-degree Mobitz 2 AV block: No reversible causes noted.  Due to that, and history of syncope, patient would benefit from pacemaker implant.  Risk and benefits have been discussed.  Risk include bleeding, tamponade, infection, pneumothorax, MI, renal failure, lead dislodgment, death.  He understands his risks and is agreed to the procedure. Coronary artery disease Post AVR  Darnell Stimson M. Huckleberry Martinson MD 10/06/2023 4:15 PM

## 2023-10-06 NOTE — H&P (View-Only) (Signed)
Cardiology Consultation   Patient ID: Gregory [name] MRN: [id number]; DOB: 05/24/1951  Admit date: 10/05/2023 Date of Consult: 10/06/2023  PCP:  Ronnald Nian, MD   [facility] Providers Cardiologist:  Peter Swaziland, MD  }     Patient Profile:   Gregory [name] is a 72 y.o. male with a hx of CAD/VHD s/p CABG/AVR (bioprosthetic) 2020 (did have post-op CHB though resolved without need for PPM,  HTN, HLD, PVD (b/l iliac disease s/p bypass),  who is being seen 10/06/2023 for the evaluation of AV block at the request of Dr. Antoine Poche.  History of Present Illness:   Gregory [name] this summer noted some lower HRs evaluation into this included cardiac monitor in 05/2023 that showed normal sinus rhythm with rare PACs and PVC. There was bradycardia during sleeping hours.   He was admitted to [facility] in September after a syncopal event resulting in head trauma, Neurosurgery was consulted and no acute or urgent interventions were needed.  F/u with cardiology with ongoing dizziness, weak spells, planned for a repeat monitor and planned for EP consult  He was admitted last night after his monitor alerted for high AV block with rates to the 30's, associated with symptoms.   LABS K+ 4.5, 4.5 BUN/Creat 18/1.06 > 1.01 WBC 6.4 H/H 14/43 Plts 143  Zio monitor tracings reviewed 09:27 13;27  17:59 AV block (appears Mobitz II) with 2:1 and 3:1 conduction, rates about 30  Home meds reviewed On no nodal blocking agents  He denies any CP, no palpitations Though by his watch he notes his HRs can be all over the place 40 or so to low 100s He reports that he gets lightheaded, weak, seated or standing, when standing he has to sit, when seated he is glad he is not standing Sometimes he thinks he may faint but doesn't He has not fainted again since Sept/head injury   Past Medical History:  Diagnosis Date   Alcohol abuse    Aortic stenosis    Arthritis    Asthma    exercise  indused or pollen-uses inhaler dail yand has rescue inhaler if needed   Bipolar disorder (HCC)    CAD (coronary artery disease)    coronary calcifications 08/2013 CTA   Carotid artery occlusion    Cataract    bilateral sx   Chronic back pain    Claudication (HCC)    Complication of anesthesia 10/16/2020   "had a panic attack when the mask was put on me"" I do not think I was given anything  before, I need something to calm me down"   Depression    Family history of skin cancer    Heart murmur    as an infant   Hepatitis    h/o 1970,doesn't remember which type,1990 epstain-barr   Herpes zoster without complication 09/05/2019   Hyperlipidemia    Hypertension    Neuromuscular disorder (HCC)    Obsessive compulsive disorder    PAD (peripheral artery disease) (HCC)    Peripheral neuropathy    Pneumonia    RBBB    RBBB (right bundle branch block with left anterior fascicular block)    Seasonal allergies    Severe aortic stenosis    Sleep apnea    does not wear CPAP   Smoker    Spinal stenosis at L4-L5 level    Tobacco abuse    Tobacco use disorder     Past Surgical History:  Procedure Laterality Date  ANAL FISTULECTOMY  09/25/2011   AORTA - BILATERAL FEMORAL ARTERY BYPASS GRAFT N/A 10/03/2020   Procedure: AORTA BIFEMORAL BYPASS GRAFT USING A HEMASHIELD GOLD BIFURCATED 14 X 7mm GRAFT AND INFERIOR MESENTERIC ARTERY REIMPLANTATION;  Surgeon: Nada Libman, MD;  Location: [facility] OR;  Service: Vascular;  Laterality: N/A;   AORTIC VALVE REPLACEMENT N/A 11/24/2019   Procedure: AORTIC VALVE REPLACEMENT (AVR) using INSPIRIS Resilia 23 MM Bioprosthetic Aortic Valve.;  Surgeon: Alleen Borne, MD;  Location: [facility] OR;  Service: Open Heart Surgery;  Laterality: N/A;   APPLICATION OF WOUND VAC Left 10/17/2020   Procedure: APPLICATION OF WOUND VAC;  Surgeon: Nada Libman, MD;  Location: [facility] OR;  Service: Vascular;  Laterality: Left;   BACK SURGERY     2015   CATARACT EXTRACTION, BILATERAL   2022   COLONOSCOPY  2009   MS-F/V-mov(exc)-HPP   CORONARY ARTERY BYPASS GRAFT N/A 11/24/2019   Procedure: CORONARY ARTERY BYPASS GRAFTING (CABG) using LIMA to LAD.;  Surgeon: Alleen Borne, MD;  Location: [facility] OR;  Service: Open Heart Surgery;  Laterality: N/A;   ELBOW SURGERY     bilaterally for cubital tunnel   ENDARTERECTOMY N/A 10/03/2020   Procedure: AORTIC ENDARTERECTOMY;  Surgeon: Nada Libman, MD;  Location: [facility] OR;  Service: Vascular;  Laterality: N/A;   ENDARTERECTOMY FEMORAL Bilateral 10/03/2020   Procedure: BILATERAL FEMORAL ENDARTERECTOMY;  Surgeon: Nada Libman, MD;  Location: [facility] OR;  Service: Vascular;  Laterality: Bilateral;   HERNIA REPAIR     with mesh   INCISION AND DRAINAGE Left 01/02/2021   Procedure: INCISION AND DRAINAGE LEFT GROIN WITH STIMULAN BEADS;  Surgeon: Nada Libman, MD;  Location: [facility] OR;  Service: Vascular;  Laterality: Left;   INCISION AND DRAINAGE OF WOUND Left 10/17/2020   Procedure: EXPLORATION LEFT GROIN WOUND;  Surgeon: Nada Libman, MD;  Location: [facility] OR;  Service: Vascular;  Laterality: Left;   MAXIMUM ACCESS (MAS)POSTERIOR LUMBAR INTERBODY FUSION (PLIF) 1 LEVEL N/A 10/25/2014   Procedure: LUMBAR FOUR TO FIVE MAXIMUM ACCESS (MAS) POSTERIOR LUMBAR INTERBODY FUSION (PLIF) 1 LEVEL;  Surgeon: Tia Alert, MD;  Location: [facility] NEURO ORS;  Service: Neurosurgery;  Laterality: N/A;   RIGHT/LEFT HEART CATH AND CORONARY ANGIOGRAPHY N/A 11/10/2019   Procedure: RIGHT/LEFT HEART CATH AND CORONARY ANGIOGRAPHY;  Surgeon: Swaziland, [name], MD;  Location: [facility]VASIVE CV LAB;  Service: Cardiovascular;  Laterality: N/A;   SKIN BIOPSY Left 10/12/2018   shave forehead Hypertrophic actinic kertosis with features of a verruca   TEE WITHOUT CARDIOVERSION N/A 11/24/2019   Procedure: TRANSESOPHAGEAL ECHOCARDIOGRAM (TEE);  Surgeon: Alleen Borne, MD;  Location: [facility] OR;  Service: Open Heart Surgery;  Laterality: N/A;   TONSILLECTOMY  age 17   VASECTOMY     X 2    VASECTOMY REVERSAL       Home Medications:  Prior to Admission medications   Medication Sig Start Date End Date Taking? Authorizing Provider  albuterol (VENTOLIN HFA) 108 (90 Base) MCG/ACT inhaler INHALE TWO PUFFS BY MOUTH EVERY 6 HOURS AS NEEDED FOR WHEEZING 04/16/23  Yes Ronnald Nian, MD  amLODipine (NORVASC) 10 MG tablet Take 1 tablet (10 mg total) by mouth daily. 04/16/23  Yes Ronnald Nian, MD  amoxicillin (AMOXIL) 500 MG capsule Take 4 capsules (2,000 mg total) by mouth once as needed for up to 1 dose. Take 4 tablets one hour before dental work 06/02/22  Yes Swaziland, [name], MD  atorvastatin (LIPITOR) 80 MG tablet Take 1  tablet (80 mg total) by mouth daily. 04/16/23  Yes Ronnald Nian, MD  azelastine (ASTELIN) 0.1 % nasal spray Place 1-2 sprays into both nostrils 2 (two) times daily as needed (nasal drainage). Use in each nostril as directed 04/16/23  Yes Ronnald Nian, MD  divalproex (DEPAKOTE ER) 500 MG 24 hr tablet Take 1 tablet (500 mg total) by mouth 3 (three) times daily. Patient taking differently: Take 1,000 mg by mouth every evening. 04/16/23  Yes Ronnald Nian, MD  finasteride (PROSCAR) 5 MG tablet Take 1 tablet (5 mg total) by mouth daily. 04/16/23  Yes Ronnald Nian, MD  fluticasone-salmeterol (ADVAIR DISKUS) 500-50 MCG/ACT AEPB Inhale 1 puff into the lungs in the morning and at bedtime. 04/16/23  Yes Ronnald Nian, MD  lamoTRIgine (LAMICTAL) 200 MG tablet Take 1 tablet (200 mg total) by mouth every morning. 04/16/23  Yes Ronnald Nian, MD  lisinopril-hydrochlorothiazide (ZESTORETIC) 20-12.5 MG tablet Take 1 tablet by mouth every morning. 04/16/23  Yes Ronnald Nian, MD  nicotine polacrilex (COMMIT) 4 MG lozenge Take 4 mg by mouth as needed for smoking cessation.   Yes [provider]    Inpatient Medications: Scheduled Meds:  amLODipine  10 mg Oral Daily   atorvastatin  80 mg Oral Daily   divalproex  1,000 mg Oral QPM   finasteride  5 mg Oral QHS    lisinopril  20 mg Oral Daily   And   hydrochlorothiazide  12.5 mg Oral Daily   lamoTRIgine  200 mg Oral q morning   mometasone-formoterol  2 puff Inhalation BID   Continuous Infusions:  PRN Meds: acetaminophen **OR** acetaminophen, albuterol  Allergies:    Allergies  Allergen Reactions   Codeine Anaphylaxis   Dihydrocodeine Anaphylaxis    Social History:   Social History   Socioeconomic History   Marital status: Married    Spouse name: Not on file   Number of children: 6   Years of education: Not on file   Highest education level: Not on file  Occupational History   Not on file  Tobacco Use   Smoking status: Every Day    Current packs/day: 0.50    Average packs/day: 0.5 packs/day for 25.0 years (12.5 ttl pk-yrs)    Types: Cigarettes   Smokeless tobacco: Never   Tobacco comments:    Pt had stopped smoking X10 years/ currently smoking a 1.5 pack a day   Vaping Use   Vaping status: Never Used  Substance and Sexual Activity   Alcohol use: Not Currently    Comment: 06/26/2014- last drink- AA   Drug use: No    Comment: former alcoholic   Sexual activity: Yes  Other Topics Concern   Not on file  Social History Narrative   Lives in [place] with wife.  Does not routinely exercise.  Sedentary r/t chronic lbp.   Social Determinants of Health   Financial Resource Strain: Low Risk  (04/10/2023)   Overall Financial Resource Strain (CARDIA)    Difficulty of Paying Living Expenses: Not very hard  Food Insecurity: No Food Insecurity (08/26/2023)   Hunger Vital Sign    Worried About Running Out of Food in the Last Year: Never true    Ran Out of Food in the Last Year: Never true  Transportation Needs: No Transportation Needs (08/26/2023)   PRAPARE - Administrator, Civil Service (Medical): No    Lack of Transportation (Non-Medical): No  Physical Activity: Sufficiently Active (04/10/2023)  Exercise Vital Sign    Days of Exercise per Week: 4 days    Minutes of Exercise  per Session: 150+ min  Stress: Stress Concern Present (04/10/2023)   [employer] of Occupational Health - Occupational Stress Questionnaire    Feeling of Stress : To some extent  Social Connections: Unknown (04/10/2023)   Social Connection and Isolation Panel [NHANES]    Frequency of Communication with Friends and Family: Once a week    Frequency of Social Gatherings with Friends and Family: Never    Attends Religious Services: Not on Marketing executive or Organizations: Yes    Attends Banker Meetings: More than 4 times per year    Marital Status: Married  Catering manager Violence: Not At Risk (08/26/2023)   Humiliation, Afraid, Rape, and Kick questionnaire    Fear of Current or Ex-Partner: No    Emotionally Abused: No    Physically Abused: No    Sexually Abused: No    Family History:   Family History  Problem Relation Age of Onset   Colon polyps Mother 83   Heart failure Mother    Stroke Father 50   Cancer Father        skin   Drug abuse Father    Hypertension Brother    Diabetes Brother    Drug abuse Brother    Heart disease Brother        s/p CABG, pacemaker   Cancer Maternal Aunt        skin   Cancer Maternal Grandmother        liver   Colon cancer Neg Hx    Esophageal cancer Neg Hx    Rectal cancer Neg Hx    Stomach cancer Neg Hx      ROS:  Please see the history of present illness.  All other ROS reviewed and negative.     Physical Exam/Data:   Vitals:   10/05/23 2245 10/05/23 2340 10/06/23 0400 10/06/23 0845  BP: (!) 153/77 (!) 156/71 131/67 135/68  Pulse: (!) 46 (!) 50 (!) 44 (!) 49  Resp: 12 17 14 13   Temp:  97.6 F (36.4 C) 97.7 F (36.5 C) 98 F (36.7 C)  TempSrc:  Oral Oral Oral  SpO2: 97% 97% 96% 96%  Weight:  59.9 kg    Height:  5\' 5"  (1.651 m)      Intake/Output Summary (Last 24 hours) at 10/06/2023 0958 Last data filed at 10/06/2023 0433 Gross per 24 hour  Intake 360 ml  Output 450 ml  Net -90 ml       10/05/2023   11:40 PM 09/28/2023    2:29 PM 09/02/2023   10:11 AM  Last 3 Weights  Weight (lbs) 132 lb 0.9 oz 136 lb 12.8 oz 128 lb 3.2 oz  Weight (kg) 59.9 kg 62.052 kg 58.151 kg     Body mass index is 21.98 kg/m.  General:  Well nourished, well developed, in no acute distress HEENT: normal Neck: no JVD Vascular: No carotid bruits; Distal pulses 2+ bilaterally Cardiac:  RRR; 2/6 SM, gallops or rubs Lungs:  CTA b/l, no wheezing, rhonchi or rales  Abd: soft, nontender Ext: no edema Musculoskeletal:  arthritic changes hands Skin: warm and dry  Neuro:  no focal abnormalities noted Psych:  Normal affect   EKG:  The EKG was personally reviewed and demonstrates:    SB 52bpm, RBBB (old)  Telemetry:  Telemetry was personally reviewed and demonstrates:  SR/SB 50's mostly  Relevant CV Studies  Updated echo is ordered, pending   12/13/2019: TTE 1. Left ventricular ejection fraction, by visual estimation, is 70 to  75%. The left ventricle has normal function. There is no left ventricular  hypertrophy.   2. The left ventricle has no regional wall motion abnormalities.   3. Global right ventricle has normal systolic function.The right  ventricular size is normal. No increase in right ventricular wall  thickness.   4. Left atrial size was normal.   5. Right atrial size was normal.   6. The mitral valve is normal in structure. No evidence of mitral valve  regurgitation. No evidence of mitral stenosis.   7. The tricuspid valve is normal in structure. Tricuspid valve  regurgitation is trivial.   8. Aortic valve regurgitation is not visualized.   9. Aortic Valve Replacement-58mm Biosprosthetic Peak velocity 2.84m/s,  mean .  10. The pulmonic valve was normal in structure. Pulmonic valve  regurgitation is not visualized.  11. Mildly elevated pulmonary artery systolic pressure.  12. The inferior vena cava is normal in size with greater than 50%  respiratory variability,  suggesting right atrial pressure of 3 mmHg.    Laboratory Data:  High Sensitivity Troponin:   Recent Labs  Lab 10/05/23 1911 10/05/23 2138  TROPONINIHS 16 15     Chemistry Recent Labs  Lab 10/05/23 1911 10/06/23 0452  NA 141 140  K 4.5 4.5  CL 102 105  CO2 27 27  GLUCOSE 94 99  BUN 18 13  CREATININE 1.06 1.01  CALCIUM 9.4 8.9  GFRNONAA >60 >60  ANIONGAP 12 8    Recent Labs  Lab 10/06/23 0452  PROT 6.6  ALBUMIN 3.4*  AST 24  ALT 28  ALKPHOS 47  BILITOT 0.7   Lipids No results for input(s): "CHOL", "TRIG", "HDL", "LABVLDL", "LDLCALC", "CHOLHDL" in the last 168 hours.  Hematology Recent Labs  Lab 10/05/23 1911  WBC 6.4  RBC 4.69  HGB 14.2  HCT 43.3  MCV 92.3  MCH 30.3  MCHC 32.8  RDW 15.3  PLT 143*   Thyroid No results for input(s): "TSH", "FREET4" in the last 168 hours.  BNPNo results for input(s): "BNP", "PROBNP" in the last 168 hours.  DDimer No results for input(s): "DDIMER" in the last 168 hours.   Radiology/Studies:  No results found.   Assessment and Plan:   Advanced heart block, Mobitz II Symptomatic bradycardia Baseline conduction system disease w/RBBB (that pre-dates his CABG) No reversible causes He Baltazar Pekala need PPM  Discussed zio monitor findings Rational for PPM Implant procedure, potential risks/benefits, he is agreeable Myriah Boggus look towards this afternoon, if schedule allows  Further as per attending cardiology team    Risk Assessment/Risk Scores:    For questions or updates, please contact [facility] Please consult [email or web address] for contact info under    Signed, Sheilah Pigeon, PA-C  10/06/2023 9:58 AM  I have seen and examined this patient with Francis Dowse.  Agree with above, note added to reflect my findings.  patient with a past history as above.  He presented to the hospital after having worn a cardiac monitor that showed evidence of second-degree AV block.  He has a history of syncope with fall and  significant head trauma.  GEN: Well nourished, well developed, in no acute distress  HEENT: normal  Neck: no JVD, carotid bruits, or masses Cardiac: Bradycardic; no murmurs, rubs, or gallops,no edema  Respiratory:  clear to auscultation bilaterally,  normal work of breathing GI: soft, nontender, nondistended, + BS MS: no deformity or atrophy  Skin: warm and dry Neuro:  Strength and sensation are intact Psych: euthymic mood, full affect   Second-degree Mobitz 2 AV block: No reversible causes noted.  Due to that, and history of syncope, patient would benefit from pacemaker implant.  Risk and benefits have been discussed.  Risk include bleeding, tamponade, infection, pneumothorax, MI, renal failure, lead dislodgment, death.  He understands his risks and is agreed to the procedure. Coronary artery disease Post AVR  Darnell Stimson M. Huckleberry Martinson MD 10/06/2023 4:15 PM

## 2023-10-07 ENCOUNTER — Inpatient Hospital Stay (HOSPITAL_COMMUNITY): Payer: PPO

## 2023-10-07 ENCOUNTER — Encounter (HOSPITAL_COMMUNITY): Payer: Self-pay | Admitting: Cardiology

## 2023-10-07 DIAGNOSIS — I441 Atrioventricular block, second degree: Secondary | ICD-10-CM | POA: Diagnosis not present

## 2023-10-07 NOTE — Discharge Summary (Addendum)
DISCHARGE SUMMARY    Patient ID: Gregory [name],  MRN: [id number], DOB/AGE: [birth year] 72 y.o.  Admit date: 10/05/2023 Discharge date: 10/07/2023  Primary Care Physician: Ronnald Nian, MD  Primary Cardiologist: Dr. Swaziland Electrophysiologist: new, dr. Elberta Fortis  Primary Discharge Diagnosis:  Mobitz II AVblock Symptomatic bradycardia Hx of syncope   Secondary Discharge Diagnosis:  CAD Hx of CABG VHD Hx of AVR (bioprosthetic) PVD Hx of b/l LE bypass HTN  Allergies  Allergen Reactions   Codeine Anaphylaxis   Dihydrocodeine Anaphylaxis     Procedures This Admission:  1.  Implantation of a abbott dual chamber PPM on 10/06/23 by Dr Elberta Fortis.   There were no immediate post procedure complications. CXR on 10/07/23 demonstrated no pneumothorax status post device implantation.   Brief HPI: Gregory [name] is a 72 y.o. male was referred to the ER for high degree AVblock noted on a Zio monitor he was wearing for hx of syncope and recurrent dizzy spells  Hospital Course:  The Zio tracings reviewed with evidence of 2:1 and 3:1 conduction with ventricular rates to about 29/30bpm during awake hours.  The patient was admitted labs unremarkable, no medications or reversible causes noted and recommended PPM  TTE with preserved LVEF, mild -mod MR (in the presence of AV dissociation), AVR functioning well.  He underwent implantation of a PPM with details as outlined in the procedure report.  He was monitored on telemetry throughout his stay with out bradycardia/sis.  The device was interrogated and found to be functioning normally.  CXR was obtained and demonstrated no pneumothorax status post device implantation.  Wound care, arm mobility, and restrictions were reviewed with the patient.  The patient feels well, denies any CP/SOB, with minimal site discomfort.  He was participating in PT about patient, requested PT evaluation for help in maneuvering his walker particularly  with his LUE limitations, this was completed.  He was examined by Dr. Elberta Fortis and considered stable for discharge to home.    Physical Exam: Vitals:   10/06/23 2247 10/07/23 0315 10/07/23 0737 10/07/23 0755  BP: 128/60 (!) 103/38 (!) 153/76   Pulse: 64 62 64 77  Resp: 16 17 18 18   Temp: 98.1 F (36.7 C) 98.1 F (36.7 C) 97.7 F (36.5 C)   TempSrc: Oral Oral Oral   SpO2: 94% 95% 96% 96%  Weight:      Height:        GEN- The patient is well appearing, alert and oriented x 3 today.   HEENT: normocephalic, atraumatic; sclera clear, conjunctiva pink; hearing intact; oropharynx clear; neck supple, no JVP Lungs- CTA b/l, normal work of breathing.  No wheezes, rales, rhonchi Heart- RRR, no murmurs, rubs or gallops, PMI not laterally displaced GI- soft, non-tender, non-distended Extremities- no clubbing, cyanosis, or edema MS- no significant deformity or atrophy Skin- warm and dry, no rash or lesion,  left chest without hematoma/ecchymosis Psych- euthymic mood, full affect Neuro- no gross deficits   Labs:   Lab Results  Component Value Date   WBC 6.4 10/05/2023   HGB 14.2 10/05/2023   HCT 43.3 10/05/2023   MCV 92.3 10/05/2023   PLT 143 (L) 10/05/2023    Recent Labs  Lab 10/06/23 0452  NA 140  K 4.5  CL 105  CO2 27  BUN 13  CREATININE 1.01  CALCIUM 8.9  PROT 6.6  BILITOT 0.7  ALKPHOS 47  ALT 28  AST 24  GLUCOSE 99    Discharge  Medications:  Allergies as of 10/07/2023       Reactions   Codeine Anaphylaxis   Dihydrocodeine Anaphylaxis        Medication List     TAKE these medications    albuterol 108 (90 Base) MCG/ACT inhaler Commonly known as: VENTOLIN HFA INHALE TWO PUFFS BY MOUTH EVERY 6 HOURS AS NEEDED FOR WHEEZING   amLODipine 10 MG tablet Commonly known as: NORVASC Take 1 tablet (10 mg total) by mouth daily.   amoxicillin 500 MG capsule Commonly known as: AMOXIL Take 4 capsules (2,000 mg total) by mouth once as needed for up to 1 dose.  Take 4 tablets one hour before dental work   atorvastatin 80 MG tablet Commonly known as: LIPITOR Take 1 tablet (80 mg total) by mouth daily.   azelastine 0.1 % nasal spray Commonly known as: ASTELIN Place 1-2 sprays into both nostrils 2 (two) times daily as needed (nasal drainage). Use in each nostril as directed   divalproex 500 MG 24 hr tablet Commonly known as: DEPAKOTE ER Take 1 tablet (500 mg total) by mouth 3 (three) times daily. What changed:  how much to take when to take this   finasteride 5 MG tablet Commonly known as: PROSCAR Take 1 tablet (5 mg total) by mouth daily.   fluticasone-salmeterol 500-50 MCG/ACT Aepb Commonly known as: Advair Diskus Inhale 1 puff into the lungs in the morning and at bedtime.   lamoTRIgine 200 MG tablet Commonly known as: LAMICTAL Take 1 tablet (200 mg total) by mouth every morning.   lisinopril-hydrochlorothiazide 20-12.5 MG tablet Commonly known as: ZESTORETIC Take 1 tablet by mouth every morning.   nicotine polacrilex 4 MG lozenge Commonly known as: COMMIT Take 4 mg by mouth as needed for smoking cessation.        Disposition:  Discharge Instructions     Diet - low sodium heart healthy   Complete by: As directed    Increase activity slowly   Complete by: As directed         Duration of Discharge Encounter: Greater than 30 minutes including physician time.  SignedFrancis Dowse, PA-C 10/07/2023 11:27 AM   I have seen and examined this patient with Francis Dowse.  Agree with above, note added to reflect my findings.  On exam, RRR, no murmurs.  She is now status post Abbott pacemaker for mobitz II AV block.  Device functioning appropriately.  Chest x-ray and interrogation without issue.  Plan for discharge today with follow-up in device clinic.  Elke Holtry M. Amisha Pospisil MD 10/07/2023 11:28 AM

## 2023-10-07 NOTE — Evaluation (Signed)
Physical Therapy Brief Evaluation and Discharge Note Patient Details Name: Gregory [name] MRN: [id number] DOB: Aug 06, 1951 Today's Date: 10/07/2023   History of Present Illness  Pt is 72 year old presented to [facility] on  10/05/23 for symptomatic bradycardia. Underwent pacer placement on 10/15.  PMH - SAH with skull fx (08/2023),CABG, aortic valve replacement, HTN, HLD, bipolar  Clinical Impression  Pt mobilizing well with rolling walker s/p pacer placement. Ready for dc home from PT standpoint. Pt has been receiving OPPT since his [facility] in September and recommend resuming that.        PT Assessment All further PT needs can be met in the next venue of care  Assistance Needed at Discharge  PRN    Equipment Recommendations None recommended by PT  Recommendations for Other Services       Precautions/Restrictions Precautions Precautions: ICD/Pacemaker        Mobility  Bed Mobility       General bed mobility comments: Pt up in chair  Transfers Overall transfer level: Modified independent Equipment used: None, Rolling walker (2 wheels)                    Ambulation/Gait Ambulation/Gait assistance: Modified independent (Device/Increase time) Gait Distance (Feet): 450 Feet Assistive device: Rolling walker (2 wheels) Gait Pattern/deviations: Step-through pattern Gait Speed: Below normal General Gait Details: bumped walker into one object on rt and able to self correct  Home Activity Instructions    Stairs            Modified Rankin (Stroke Patients Only)        Balance Overall balance assessment: Mild deficits observed, not formally tested                        Pertinent Vitals/Pain PT - Brief Vital Signs All Vital Signs Stable: Yes Pain Assessment Pain Assessment: Faces Faces Pain Scale: No hurt     Home Living Family/patient expects to be discharged to:: Private residence Living Arrangements: Spouse/significant other Available Help  at Discharge: Family Home Environment: Stairs to enter  [place] of Steps: 3-4 Home Equipment: Agricultural consultant (2 wheels)        Prior Function Level of Independence: Independent with assistive device(s) Comments: uses rolling walker since [facility]    UE/LE Assessment   UE ROM/Strength/Tone/Coordination: Impaired UE ROM/Strength/Tone/Coordination Deficits: LUE limited by pacer precautions  LE ROM/Strength/Tone/Coordination: [facility]      Communication   Communication Communication: No apparent difficulties     Cognition Overall Cognitive Status: Appears within functional limits for tasks assessed/performed       General Comments      Exercises     Assessment/Plan    PT Problem List Decreased balance       PT Visit Diagnosis Other abnormalities of gait and mobility (R26.89)    No Skilled PT     Co-evaluation                AMPAC 6 Clicks Help needed turning from your back to your side while in a flat bed without using bedrails?: None Help needed moving from lying on your back to sitting on the side of a flat bed without using bedrails?: None Help needed moving to and from a bed to a chair (including a wheelchair)?: None Help needed standing up from a chair using your arms (e.g., wheelchair or bedside chair)?: None Help needed to walk in hospital room?: None Help needed climbing 3-5 steps with  a railing? : A Little 6 Click Score: 23      End of Session   Activity Tolerance: Patient tolerated treatment well Patient left: in chair;with call bell/phone within reach Nurse Communication: Mobility status PT Visit Diagnosis: Other abnormalities of gait and mobility (R26.89)     Time: 0981-1914 PT Time Calculation (min) (ACUTE ONLY): 15 min  Charges:   PT Evaluation $PT Eval Low Complexity: 1 Low      [facility] PT Acute Rehabilitation Services Office [phone]   Gregory [name]  10/07/2023, 11:04 AM

## 2023-10-07 NOTE — Discharge Instructions (Signed)
After Your Pacemaker   You have a Abbott Pacemaker  ACTIVITY Do not lift your arm above shoulder height for 1 week after your procedure. After 7 days, you may progress as below.  You should remove your sling 24 hours after your procedure, unless otherwise instructed by your provider.     Wednesday October 14, 2023  Thursday October 15, 2023 Friday October 16, 2023 Saturday October 17, 2023   Do not lift, push, pull, or carry anything over 10 pounds with the affected arm until 6 weeks (Wednesday November 18, 2023 ) after your procedure.   You may drive AFTER your wound check, unless you have been told otherwise by your provider.   Ask your healthcare provider when you can go back to work   INCISION/Dressing Monitor your Pacemaker site for redness, swelling, and drainage. Call the device clinic at [phone] if you experience these symptoms or fever/chills.  If your incision is sealed with Steri-strips or staples, you may shower 7 days after your procedure or when told by your provider. Do not remove the steri-strips or let the shower hit directly on your site. You may wash around your site with soap and water.    If you were discharged in a sling, please do not wear this during the day more than 48 hours after your surgery unless otherwise instructed. This may increase the risk of stiffness and soreness in your shoulder.   Avoid lotions, ointments, or perfumes over your incision until it is well-healed.  You may use a hot tub or a pool AFTER your wound check appointment if the incision is completely closed.  Pacemaker Alerts:  Some alerts are vibratory and others beep. These are NOT emergencies. Please call our office to let us know. If this occurs at night or on weekends, it can wait until the next business day. Send a remote transmission.  If your device is capable of reading fluid status (for heart failure), you will be offered monthly monitoring to review this with you.    DEVICE MANAGEMENT Remote monitoring is used to monitor your pacemaker from home. This monitoring is scheduled every 91 days by our office. It allows [place] to keep an eye on the functioning of your device to ensure it is working properly. You will routinely see your Electrophysiologist annually (more often if necessary).   You should receive your ID card for your new device in 4-8 weeks. Keep this card with you at all times once received. Consider wearing a medical alert bracelet or necklace.  Your Pacemaker may be MRI compatible. This will be discussed at your next office visit/wound check.  You should avoid contact with strong electric or magnetic fields.   Do not use amateur (ham) radio equipment or electric (arc) welding torches. MP3 player headphones with magnets should not be used. Some devices are safe to use if held at least 12 inches (30 cm) from your Pacemaker. These include power tools, lawn mowers, and speakers. If you are unsure if something is safe to use, ask your health care provider.  When using your cell phone, hold it to the ear that is on the opposite side from the Pacemaker. Do not leave your cell phone in a pocket over the Pacemaker.  You may safely use electric blankets, heating pads, computers, and microwave ovens.  Call the office right away if: You have chest pain. You feel more short of breath than you have felt before. You feel more light-headed than you have felt  before. Your incision starts to open up.  This information is not intended to replace advice given to you by your health care provider. Make sure you discuss any questions you have with your health care provider.

## 2023-10-07 NOTE — Progress Notes (Addendum)
Pt stable for transition home today s/p PPM,  pt has DME (RW) and to f/u to resume outpt PT- no TOC needs noted    10/07/23 1158  TOC Brief Assessment  Insurance and Status Reviewed  Patient has primary care physician Yes  Home environment has been reviewed home w/ spouse  Prior level of function: self  Social Determinants of Health Reivew SDOH reviewed no interventions necessary  Readmission risk has been reviewed Yes  Transition of care needs no transition of care needs at this time

## 2023-10-08 ENCOUNTER — Telehealth: Payer: Self-pay | Admitting: Family Medicine

## 2023-10-08 ENCOUNTER — Telehealth: Payer: Self-pay

## 2023-10-08 ENCOUNTER — Ambulatory Visit: Payer: PPO

## 2023-10-08 ENCOUNTER — Encounter: Payer: PPO | Admitting: Occupational Therapy

## 2023-10-08 NOTE — Telephone Encounter (Signed)
Gregory [name] called to inform you he had a new pace maker put in this past Tuesday by [facility].

## 2023-10-08 NOTE — Transitions of Care (Post Inpatient/ED Visit) (Signed)
10/08/2023  Name: Gregory [name] MRN: [id number] DOB: [birth year]  Today's TOC FU Call Status: Today's TOC FU Call Status:: Successful TOC FU Call Completed TOC FU Call Complete Date: 10/08/23 Patient's Name and Date of Birth confirmed.  Transition Care Management Follow-up Telephone Call Date of Discharge: 10/07/23 Discharge Facility: [facility]) Type of Discharge: Inpatient Admission Primary Inpatient Discharge Diagnosis:: Mobitz AV Block w/ PPM placement How have you been since you were released from the hospital?: Better Any questions or concerns?: No  Items Reviewed: Did you receive and understand the discharge instructions provided?: Yes Medications obtained,verified, and reconciled?: Yes (Medications Reviewed) Any new allergies since your discharge?: No Dietary orders reviewed?: Yes Type of Diet Ordered:: Reg NAS Heart Healthy Do you have support at home?: Yes People in Home: spouse Name of Support/Comfort Primary Source: Mary  Medications Reviewed Today: Medications Reviewed Today   Medications were not reviewed in this encounter     Home Care and Equipment/Supplies: Were Home Health Services Ordered?: No (Was followed by [facility] rehab) Any new equipment or medical supplies ordered?: No  Functional Questionnaire: Do you need assistance with bathing/showering or dressing?: No Do you need assistance with meal preparation?: No Do you need assistance with eating?: No Do you have difficulty maintaining continence: No Do you need assistance with getting out of bed/getting out of a chair/moving?: No Do you have difficulty managing or taking your medications?: No  Follow up appointments reviewed: PCP Follow-up appointment confirmed?: Yes Follow-up Provider: Griselda Miner Follow-up appointment confirmed?: Yes Date of Specialist follow-up appointment?: 10/19/23 Follow-Up Specialty Provider:: Cardio 10/28 PPM wound check 10/30 Do  you need transportation to your follow-up appointment?: No Do you understand care options if your condition(s) worsen?: Yes-patient verbalized understanding  SDOH Interventions Today    Flowsheet Row Most Recent Value  SDOH Interventions   Health Literacy Interventions Intervention Not Indicated       Goals Addressed             This Visit's Progress    TOC Care Plan       Current Barriers:  Medication management No missed medications, will report any adverse side effects Provider appointments Cardiology, Neurology  RNCM Clinical Goal(s):  Patient will verbalize understanding of plan for management of Atrial Fibrillation as evidenced by heart rate will remain between 60-100 take all medications exactly as prescribed and will call provider for medication related questions as evidenced by no unreported adverse side effect or unmanaged symptoms  attend all scheduled medical appointments: Cardiology, Neurology and PCP  as evidenced by no missed appointments , labs or testing   through collaboration with RN Care manager, provider, and care team.   Interventions: Evaluation of current treatment plan related to  self management and patient's adherence to plan as established by provider  Transitions of Care:  New goal. Doctor Visits  - discussed the importance of doctor visits next cardio 10/28, then 11/18 Wound check s/p PPM implant 10/30. Will call PCP for appt  Patient Goals/Self-Care Activities: Participate in Transition of Care Program/Attend TOC scheduled calls Take all medications as prescribed Attend all scheduled provider appointments Call pharmacy for medication refills 3-7 days in advance of running out of medications Perform all self care activities independently  Perform IADL's (shopping, preparing meals, housekeeping, managing finances) independently Call provider office for new concerns or questions   Follow Up Plan:  The patient has been provided with contact  information for the care management team and  has been advised to call with any health related questions or concerns.          Routine follow-up and on-going assessment evaluation and education of disease processes, recommended interventions for both chronic and acute medical conditions , will occur during each weekly visit along with ongoing review of symptoms ,medication reviews and reconciliation. Any updates , inconsistencies, discrepancies or acute care concerns will be addressed and routed to the correct Practitioner if indicated   Please refer to Care Plan for goals and interventions -Effectiveness of interventions, symptom management and outcomes will be evaluated in weekly during [facility] 30-day Program Outreach calls  . Any necessary  changes and updates to Care Plan will be completed episodically    Reviewed goals for care  Patient provided with Contact information and verbalized understanding with current POC.  [name] , BSN, RN Care Management Coordinator [place]   [facility] [email or web address] Direct Dial: [phone]

## 2023-10-12 ENCOUNTER — Telehealth: Payer: Self-pay | Admitting: Cardiology

## 2023-10-12 NOTE — Telephone Encounter (Signed)
Pt fractured his skull back on 9/3. Pt is currently in PT, OT and ST d/t this. PT for balance, OT & ST b/c his peripheral vision was affected. They need to know when he can start back and what, if any, restrictions there will be in order to return. Aware forwarding to device clinic to further address/clarify restrictions and when pt can return to therapies. Aware they will follow up with him on this. Pt and wife agreeable to plan.

## 2023-10-12 NOTE — Progress Notes (Signed)
Letter printed and left at front office. Patient called and made aware.

## 2023-10-12 NOTE — Telephone Encounter (Signed)
Patient states he needs a release letter stating he can resume his PT, OT and speech therapy again.  Please call once this is done.

## 2023-10-13 ENCOUNTER — Ambulatory Visit: Payer: PPO | Admitting: Physical Therapy

## 2023-10-13 ENCOUNTER — Encounter: Payer: PPO | Admitting: Occupational Therapy

## 2023-10-14 ENCOUNTER — Ambulatory Visit: Payer: PPO

## 2023-10-14 DIAGNOSIS — R41841 Cognitive communication deficit: Secondary | ICD-10-CM | POA: Diagnosis not present

## 2023-10-14 DIAGNOSIS — M6281 Muscle weakness (generalized): Secondary | ICD-10-CM | POA: Diagnosis not present

## 2023-10-14 DIAGNOSIS — R42 Dizziness and giddiness: Secondary | ICD-10-CM | POA: Diagnosis not present

## 2023-10-14 DIAGNOSIS — R2689 Other abnormalities of gait and mobility: Secondary | ICD-10-CM | POA: Diagnosis not present

## 2023-10-14 DIAGNOSIS — R2681 Unsteadiness on feet: Secondary | ICD-10-CM | POA: Diagnosis not present

## 2023-10-14 DIAGNOSIS — I69018 Other symptoms and signs involving cognitive functions following nontraumatic subarachnoid hemorrhage: Secondary | ICD-10-CM | POA: Diagnosis not present

## 2023-10-14 DIAGNOSIS — Z9181 History of falling: Secondary | ICD-10-CM | POA: Diagnosis not present

## 2023-10-14 DIAGNOSIS — R41844 Frontal lobe and executive function deficit: Secondary | ICD-10-CM | POA: Diagnosis not present

## 2023-10-14 DIAGNOSIS — R41842 Visuospatial deficit: Secondary | ICD-10-CM | POA: Diagnosis not present

## 2023-10-14 NOTE — Therapy (Signed)
OUTPATIENT SPEECH LANGUAGE PATHOLOGY EVALUATION   Patient Name: Gregory [name] MRN: [id number] DOB:[birth year], 72 y.o., male Today's Date: 10/14/2023  PCP: Ronnald Nian, MD REFERRING PROVIDER: Ronnald Nian, MD  END OF SESSION:  End of Session - 10/14/23 1206     Visit Number 2    Number of Visits 18    Date for SLP Re-Evaluation 11/25/23    SLP Start Time 1020    SLP Stop Time  1105    SLP Time Calculation (min) 45 min    Activity Tolerance Patient tolerated treatment well             Past Medical History:  Diagnosis Date   Alcohol abuse    Aortic stenosis    Arthritis    Asthma    exercise indused or pollen-uses inhaler dail yand has rescue inhaler if needed   Bipolar disorder (HCC)    CAD (coronary artery disease)    coronary calcifications 08/2013 CTA   Carotid artery occlusion    Cataract    bilateral sx   Chronic back pain    Claudication (HCC)    Complication of anesthesia 10/16/2020   "had a panic attack when the mask was put on me"" I do not think I was given anything  before, I need something to calm me down"   Depression    Family history of skin cancer    Heart murmur    as an infant   Hepatitis    h/o 1970,doesn't remember which type,1990 epstain-barr   Herpes zoster without complication 09/05/2019   Hyperlipidemia    Hypertension    Neuromuscular disorder (HCC)    Obsessive compulsive disorder    PAD (peripheral artery disease) (HCC)    Peripheral neuropathy    Pneumonia    RBBB    RBBB (right bundle branch block with left anterior fascicular block)    Seasonal allergies    Severe aortic stenosis    Sleep apnea    does not wear CPAP   Smoker    Spinal stenosis at L4-L5 level    Tobacco abuse    Tobacco use disorder    Past Surgical History:  Procedure Laterality Date   ANAL FISTULECTOMY  09/25/2011   AORTA - BILATERAL FEMORAL ARTERY BYPASS GRAFT N/A 10/03/2020   Procedure: AORTA BIFEMORAL BYPASS GRAFT USING A HEMASHIELD  GOLD BIFURCATED 14 X 7mm GRAFT AND INFERIOR MESENTERIC ARTERY REIMPLANTATION;  Surgeon: Nada Libman, MD;  Location: [facility] OR;  Service: Vascular;  Laterality: N/A;   AORTIC VALVE REPLACEMENT N/A 11/24/2019   Procedure: AORTIC VALVE REPLACEMENT (AVR) using INSPIRIS Resilia 23 MM Bioprosthetic Aortic Valve.;  Surgeon: Alleen Borne, MD;  Location: [facility] OR;  Service: Open Heart Surgery;  Laterality: N/A;   APPLICATION OF WOUND VAC Left 10/17/2020   Procedure: APPLICATION OF WOUND VAC;  Surgeon: Nada Libman, MD;  Location: [facility] OR;  Service: Vascular;  Laterality: Left;   BACK SURGERY     2015   CATARACT EXTRACTION, BILATERAL  2022   COLONOSCOPY  2009   MS-F/V-mov(exc)-HPP   CORONARY ARTERY BYPASS GRAFT N/A 11/24/2019   Procedure: CORONARY ARTERY BYPASS GRAFTING (CABG) using LIMA to LAD.;  Surgeon: Alleen Borne, MD;  Location: [facility] OR;  Service: Open Heart Surgery;  Laterality: N/A;   ELBOW SURGERY     bilaterally for cubital tunnel   ENDARTERECTOMY N/A 10/03/2020   Procedure: AORTIC ENDARTERECTOMY;  Surgeon: Nada Libman, MD;  Location: [facility];  Service:  Vascular;  Laterality: N/A;   ENDARTERECTOMY FEMORAL Bilateral 10/03/2020   Procedure: BILATERAL FEMORAL ENDARTERECTOMY;  Surgeon: Nada Libman, MD;  Location: [facility];  Service: Vascular;  Laterality: Bilateral;   HERNIA REPAIR     with mesh   INCISION AND DRAINAGE Left 01/02/2021   Procedure: INCISION AND DRAINAGE LEFT GROIN WITH STIMULAN BEADS;  Surgeon: Nada Libman, MD;  Location: [facility];  Service: Vascular;  Laterality: Left;   INCISION AND DRAINAGE OF WOUND Left 10/17/2020   Procedure: EXPLORATION LEFT GROIN WOUND;  Surgeon: Nada Libman, MD;  Location: [facility];  Service: Vascular;  Laterality: Left;   MAXIMUM ACCESS (MAS)POSTERIOR LUMBAR INTERBODY FUSION (PLIF) 1 LEVEL N/A 10/25/2014   Procedure: LUMBAR FOUR TO FIVE MAXIMUM ACCESS (MAS) POSTERIOR LUMBAR INTERBODY FUSION (PLIF) 1 LEVEL;  Surgeon: Tia Alert, MD;   Location: [facility];  Service: Neurosurgery;  Laterality: N/A;   PACEMAKER IMPLANT N/A 10/06/2023   Procedure: PACEMAKER IMPLANT;  Surgeon: Regan Lemming, MD;  Location: [facility] INVAS[facility];  Service: Cardiovascular;  Laterality: N/A;   RIGHT/LEFT HEART CATH AND CORONARY ANGIOGRAPHY N/A 11/10/2019   Procedure: RIGHT/LEFT HEART CATH AND CORONARY ANGIOGRAPHY;  Surgeon: Swaziland, [name], MD;  [facility] INVASIVE CV LAB;  Service: Cardiovascular;  Laterality: N/A;   SKIN BIOPSY Left 10/12/2018   shave forehead Hypertrophic actinic kertosis with features of a verruca   TEE WITHOUT CARDIOVERSION N/A 11/24/2019   Procedure: TRANSESOPHAGEAL ECHOCARDIOGRAM (TEE);  Surgeon: Alleen Borne, MD;  Location: [facility];  Service: Open Heart Surgery;  Laterality: N/A;   TONSILLECTOMY  age 51   VASECTOMY     X 2   VASECTOMY REVERSAL     Patient Active Problem List   Diagnosis Date Noted   Symptomatic bradycardia 10/05/2023   Subarachnoid hemorrhage following injury (HCC) 08/26/2023   Skull fracture with cerebral contusion (HCC) 08/26/2023   Temporal bone fracture (HCC) 08/26/2023   Aortic atherosclerosis (HCC) 05/26/2023   Acute non-recurrent pansinusitis 01/04/2023   Perennial allergic rhinitis 07/09/2022   Asthma-COPD overlap syndrome (HCC) 04/21/2022   Recurrent infections 04/21/2022   S/P aortobifemoral bypass surgery 04/03/2021   S/P CABG x 1 11/24/2019   S/P aortic valve replacement with bioprosthetic valve 11/24/2019   PAD (peripheral artery disease) (HCC)    Mild persistent asthma 10/29/2019   RBBB (right bundle branch block with left anterior fascicular block)    Peripheral neuropathy    S/P lumbar spinal fusion 10/25/2014   Recovering alcoholic in remission (HCC) 08/10/2014   Substance induced mood disorder (HCC) 06/30/2014   Substance-induced sleep disorder (HCC) 06/30/2014   Obsessive compulsive disorder    OSA (obstructive sleep apnea) 01/20/2014   CAD (coronary artery  disease)    Mentally disabled 11/25/2012   Hyperlipidemia with target LDL less than 130 11/25/2012   Bipolar disorder (HCC)    Hypertension    Allergic rhinitis due to pollen 08/11/2011    ONSET DATE: 08/25/23   REFERRING DIAG: W09.9X1D (ICD-10-CM) - Traumatic brain injury, with loss of consciousness of 30 minutes or less, subsequent encounter R41.3 (ICD-10-CM) - Memory deficit  THERAPY DIAG:  Cognitive communication deficit  Rationale for Evaluation and Treatment: Rehabilitation  SUBJECTIVE:   SUBJECTIVE STATEMENT: Per wife: "He leaves his walker at one door in the kitchen and walks out the other" Pt accompanied by: significant other Gregory [name]  PERTINENT HISTORY: Pt reports long history of cardiac issues/dizziness, but has never lost consciousness. Pt reports he felt dizzy and was  reaching up at an aisle in [employer]. Lost consciousness and fell backwards onto the concrete floor and his his head. Does not know how long he was unconscious, was found by another shopper. Has a history of bradycardia, wore a heart monitor for 2 weeks and was cleared by his cardiologist in August to exercise prior to this incident. Works as a Investment banker, operational at [employer] and is used to weightlifting/cardio several times per week.   PAIN:  Are you having pain? No  FALLS: Has patient fallen in last 6 months?  See PT evaluation for details  LIVING ENVIRONMENT: Lives with: lives with their spouse Lives in: House/apartment  PLOF:  Level of assistance: Independent with ADLs, Independent with IADLs Employment: Part-time employment  PATIENT GOALS: "To go back to work and drive"  OBJECTIVE:   TODAY'S TREATMENT:                                                                                                                                         10/14/23: Pt experienced medical event since prior evaluation session and had a pacemaker implanted. Endorses use of walker and limited challenges, although wife  reports otherwise. Wife stated that pt is not using walker consistently, such as walking by it when leaving couch, leaving it in one kitchen door and exiting out the other, and not bringing it to the bathroom. Pt agreed with wife's statements and commented "out of sight out of mind." SLP led pt and wife in generating functional solutions, with wife stating she can place chair in second kitchen doorway as a visual cue to use the other one (where walker is placed). Additionally SLP recommended keeping walker within a couple feet at all times, such as when watching tv on the couch. Pt reports returning to simple cooking tasks with wife assisting with cutting, as pt cannot use knife. Demonstrated anticipatory awareness skills when noting that 3-minutes was too long for toast (it burned) and that he would set the timer for less next time.   09/30/23: Reviewed goals - eval only   PATIENT EDUCATION: Education details: See Today's Treatment, See Patient Instructions, compensations for cognition, accommodations in workplace Person educated: Patient and Spouse Education method: Explanation and Verbal cues Education comprehension: verbal cues required and needs further education   GOALS: Goals reviewed with patient? Yes  SHORT TERM GOALS: Target date: 10/28/23  Pt will carryover 2 strategies for attention to successfully carryover household tasks that require alternating attention with supervision cues.  Baseline: Goal status: IN PROGRESS  2.  Pt will make safe decisions at home during household tasks with rare min A Baseline:  Goal status: IN PROGRESS  3.  Pt will verbalize 3 compensations for attention when driving (for if/when he is cleared my MD to drive) Baseline:  Goal status: IN PROGRESS  4. Pt will verbalize safety awareness in kitchen, garden, and work with rare  min A             Goal status: IN PROGRESS   LONG TERM GOALS: Target date: 11/25/23  Pt will carryover compensations for  attention on divided attention tasks at home Baseline:  Goal status: IN PROGRESS  2.  Pt will verbalize 3 strategies to support alternating and divided attention at work with rare min A Baseline:  Goal status: IN PROGRESS  3.  Pt will verbalize 3 accommodations to support cognition and energy conservation upon return to work Baseline:  Goal status: IN PROGRESS  ASSESSMENT:  CLINICAL IMPRESSION: Patient is a 72 y.o. male who was seen today for cognition s/p TBI and fall. Today he presents with mild high level cognitive impairment. He is accompanied by his spouse, Gregory [name]. She reports concern re: his safety awareness as he forgets or elects to not use his walker. He demonstrates poor safety awareness in a variety of situations. He hopes to return to work and short distance driving. SLP provided education of cognitive strategies and assisted in problem-solving safety situations. Pt will continue to benefit from ST to maximize safety for possible return to work, household tasks, and possible return to driving.   OBJECTIVE IMPAIRMENTS: include attention and awareness. These impairments are limiting patient from return to work and household responsibilities. Factors affecting potential to achieve goals and functional outcome are  n/a .[name] Patient will benefit from skilled SLP services to address above impairments and improve overall function.  REHAB POTENTIAL: Good  PLAN:  SLP FREQUENCY: 2x/week  SLP DURATION: 8 weeks  PLANNED INTERVENTIONS: Environmental controls, Cueing hierachy, Cognitive reorganization, Internal/external aids, Functional tasks, and Multimodal communication approach    Archimedes Harold, Student-SLP 10/14/2023, 12:07 PM

## 2023-10-15 ENCOUNTER — Ambulatory Visit: Payer: PPO

## 2023-10-15 ENCOUNTER — Encounter: Payer: PPO | Admitting: Occupational Therapy

## 2023-10-15 NOTE — Plan of Care (Signed)
[facility] Tonsillectomy/Adenoidectomy, Postoperative PEDS care plan entered in error.

## 2023-10-16 ENCOUNTER — Telehealth: Payer: Self-pay

## 2023-10-16 ENCOUNTER — Other Ambulatory Visit: Payer: Self-pay

## 2023-10-16 ENCOUNTER — Encounter: Payer: Self-pay | Admitting: Physical Therapy

## 2023-10-16 ENCOUNTER — Ambulatory Visit: Payer: PPO | Admitting: Occupational Therapy

## 2023-10-16 ENCOUNTER — Ambulatory Visit: Payer: PPO | Admitting: Physical Therapy

## 2023-10-16 VITALS — BP 164/78

## 2023-10-16 DIAGNOSIS — Z9181 History of falling: Secondary | ICD-10-CM

## 2023-10-16 DIAGNOSIS — M6281 Muscle weakness (generalized): Secondary | ICD-10-CM

## 2023-10-16 DIAGNOSIS — R42 Dizziness and giddiness: Secondary | ICD-10-CM

## 2023-10-16 DIAGNOSIS — R2681 Unsteadiness on feet: Secondary | ICD-10-CM

## 2023-10-16 DIAGNOSIS — R41842 Visuospatial deficit: Secondary | ICD-10-CM

## 2023-10-16 DIAGNOSIS — R2689 Other abnormalities of gait and mobility: Secondary | ICD-10-CM

## 2023-10-16 DIAGNOSIS — I69018 Other symptoms and signs involving cognitive functions following nontraumatic subarachnoid hemorrhage: Secondary | ICD-10-CM

## 2023-10-16 NOTE — Therapy (Unsigned)
OUTPATIENT PHYSICAL THERAPY NEURO RE-CERT   Patient Name: Gregory [name] MRN: [id number] DOB:29-Dec-1950, 72 y.o., male Today's Date: 10/16/2023   PCP: Ronnald Nian, MD REFERRING PROVIDER: Willeen Niece, MD  END OF SESSION:  PT End of Session - 10/16/23 0806     Visit Number 10    Number of Visits 17    Date for PT Re-Evaluation 11/10/23    Authorization Type [employer]    PT Start Time 1605    Equipment Utilized During Treatment Gait belt    Activity Tolerance Patient tolerated treatment well    Behavior During Therapy [facility] for tasks assessed/performed;Anxious             Past Medical History:  Diagnosis Date   Alcohol abuse    Aortic stenosis    Arthritis    Asthma    exercise indused or pollen-uses inhaler dail yand has rescue inhaler if needed   Bipolar disorder (HCC)    CAD (coronary artery disease)    coronary calcifications 08/2013 CTA   Carotid artery occlusion    Cataract    bilateral sx   Chronic back pain    Claudication (HCC)    Complication of anesthesia 10/16/2020   "had a panic attack when the mask was put on me"" I do not think I was given anything  before, I need something to calm me down"   Depression    Family history of skin cancer    Heart murmur    as an infant   Hepatitis    h/o 1970,doesn't remember which type,1990 epstain-barr   Herpes zoster without complication 09/05/2019   Hyperlipidemia    Hypertension    Neuromuscular disorder (HCC)    Obsessive compulsive disorder    PAD (peripheral artery disease) (HCC)    Peripheral neuropathy    Pneumonia    RBBB    RBBB (right bundle branch block with left anterior fascicular block)    Seasonal allergies    Severe aortic stenosis    Sleep apnea    does not wear CPAP   Smoker    Spinal stenosis at L4-L5 level    Tobacco abuse    Tobacco use disorder    Past Surgical History:  Procedure Laterality Date   ANAL FISTULECTOMY  09/25/2011   AORTA - BILATERAL FEMORAL  ARTERY BYPASS GRAFT N/A 10/03/2020   Procedure: AORTA BIFEMORAL BYPASS GRAFT USING A HEMASHIELD GOLD BIFURCATED 14 X 7mm GRAFT AND INFERIOR MESENTERIC ARTERY REIMPLANTATION;  Surgeon: Nada Libman, MD;  Location: [facility] OR;  Service: Vascular;  Laterality: N/A;   AORTIC VALVE REPLACEMENT N/A 11/24/2019   Procedure: AORTIC VALVE REPLACEMENT (AVR) using INSPIRIS Resilia 23 MM Bioprosthetic Aortic Valve.;  Surgeon: Alleen Borne, MD;  Location: [facility] OR;  Service: Open Heart Surgery;  Laterality: N/A;   APPLICATION OF WOUND VAC Left 10/17/2020   Procedure: APPLICATION OF WOUND VAC;  Surgeon: Nada Libman, MD;  Location: [facility] OR;  Service: Vascular;  Laterality: Left;   BACK SURGERY     2015   CATARACT EXTRACTION, BILATERAL  2022   COLONOSCOPY  2009   MS-F/V-mov(exc)-HPP   CORONARY ARTERY BYPASS GRAFT N/A 11/24/2019   Procedure: CORONARY ARTERY BYPASS GRAFTING (CABG) using LIMA to LAD.;  Surgeon: Alleen Borne, MD;  Location: [facility] OR;  Service: Open Heart Surgery;  Laterality: N/A;   ELBOW SURGERY     bilaterally for cubital tunnel   ENDARTERECTOMY N/A 10/03/2020   Procedure: AORTIC ENDARTERECTOMY;  Surgeon:  Nada Libman, MD;  Location: [facility] OR;  Service: Vascular;  Laterality: N/A;   ENDARTERECTOMY FEMORAL Bilateral 10/03/2020   Procedure: BILATERAL FEMORAL ENDARTERECTOMY;  Surgeon: Nada Libman, MD;  Location: [facility] OR;  Service: Vascular;  Laterality: Bilateral;   HERNIA REPAIR     with mesh   INCISION AND DRAINAGE Left 01/02/2021   Procedure: INCISION AND DRAINAGE LEFT GROIN WITH STIMULAN BEADS;  Surgeon: Nada Libman, MD;  Location: [facility] OR;  Service: Vascular;  Laterality: Left;   INCISION AND DRAINAGE OF WOUND Left 10/17/2020   Procedure: EXPLORATION LEFT GROIN WOUND;  Surgeon: Nada Libman, MD;  Location: [facility] OR;  Service: Vascular;  Laterality: Left;   MAXIMUM ACCESS (MAS)POSTERIOR LUMBAR INTERBODY FUSION (PLIF) 1 LEVEL N/A 10/25/2014   Procedure: LUMBAR FOUR TO FIVE MAXIMUM  ACCESS (MAS) POSTERIOR LUMBAR INTERBODY FUSION (PLIF) 1 LEVEL;  Surgeon: Tia Alert, MD;  Location: [facility] NEURO ORS;  Service: Neurosurgery;  Laterality: N/A;   PACEMAKER IMPLANT N/A 10/06/2023   Procedure: PACEMAKER IMPLANT;  Surgeon: Regan Lemming, MD;  [facility]: [facility] INVASIVE CV LAB;  Service: Cardiovascular;  Laterality: N/A;   RIGHT/LEFT HEART CATH AND CORONARY ANGIOGRAPHY N/A 11/10/2019   Procedure: RIGHT/LEFT HEART CATH AND CORONARY ANGIOGRAPHY;  Surgeon: Swazil[place], [name], MD;  Location: [facility] INVASIVE CV LAB;  Service: Cardiovascular;  Laterality: N/A;   SKIN BIOPSY Left 10/12/2018   shave forehead Hypertrophic actinic kertosis with features of a verruca   TEE WITHOUT CARDIOVERSION N/A 11/24/2019   Procedure: TRANSESOPHAGEAL ECHOCARDIOGRAM (TEE);  Surgeon: Alleen Borne, MD;  Location: [facility] OR;  Service: Open Heart Surgery;  Laterality: N/A;   TONSILLECTOMY  age 71   VASECTOMY     X 2   VASECTOMY REVERSAL     Patient Active Problem List   Diagnosis Date Noted   Symptomatic bradycardia 10/05/2023   Subarachnoid hemorrhage following injury (HCC) 08/26/2023   Skull fracture with cerebral contusion (HCC) 08/26/2023   Temporal bone fracture (HCC) 08/26/2023   Aortic atherosclerosis (HCC) 05/26/2023   Acute non-recurrent pansinusitis 01/04/2023   Perennial allergic rhinitis 07/09/2022   Asthma-COPD overlap syndrome (HCC) 04/21/2022   Recurrent infections 04/21/2022   S/P aortobifemoral bypass surgery 04/03/2021   S/P CABG x 1 11/24/2019   S/P aortic valve replacement with bioprosthetic valve 11/24/2019   PAD (peripheral artery disease) (HCC)    Mild persistent asthma 10/29/2019   RBBB (right bundle branch block with left anterior fascicular block)    Peripheral neuropathy    S/P lumbar spinal fusion 10/25/2014   Recovering alcoholic in remission (HCC) 08/10/2014   Substance induced mood disorder (HCC) 06/30/2014   Substance-induced sleep disorder (HCC) 06/30/2014   Obsessive  compulsive disorder    OSA (obstructive sleep apnea) 01/20/2014   CAD (coronary artery disease)    Mentally disabled 11/25/2012   Hyperlipidemia with target LDL less than 130 11/25/2012   Bipolar disorder (HCC)    Hypertension    Allergic rhinitis due to pollen 08/11/2011    ONSET DATE: 08/28/2023  REFERRING DIAG: R55 (ICD-10-CM) - Syncope, unspecified syncope type I60.9 (ICD-10-CM) - Subarachnoid hemorrhage (HCC)  THERAPY DIAG:  Unsteadiness on feet  Muscle weakness (generalized)  History of falling  Dizziness and giddiness  Other abnormalities of gait and mobility  Rationale for Evaluation and Treatment: Rehabilitation  SUBJECTIVE:  SUBJECTIVE STATEMENT: Patient returned to physical therapy following pacemaker placement on 09/29/2023. Patient received resume orders and updated precautions. Patient spouse reprots that since pacemaker placement, patient has more energy and clearer thought. Denies   Pt accompanied by:  Wife, Chales Abrahams   PERTINENT HISTORY:  pacemaker placement 09/29/2023, CABG, aortic valve replacement (2021)  PAIN:  Are you having pain? Yes: NPRS scale: 5/10 Pain location: L shoulder Pain description: achy Aggravating factors: Movement  Relieving factors: tylenol  PRECAUTIONS: Fall and Other: no bending forward (skull fx)   PATIENT GOALS: "To reduce my symptoms."  OBJECTIVE:   DIAGNOSTIC FINDINGS:   CT Head wo Contrast 09/08/2023 IMPRESSION: 1. Expected evolution of multicompartmental intracranial hemorrhage since 08/28/2023, with no residual hyperdense blood. 2. 3 mm thick hypodense right parietal convexity subdural collection is unchanged and may reflect a subdural hygroma or evolving subdural hematoma. 3. Unchanged right temporal/parietal bone fracture.  CT  Lumbar Spine wo Contrast 09/08/2023: IMPRESSION: 1. No acute fracture or traumatic malalignment of the lumbar spine. 2. Postsurgical changes reflecting posterior instrumented fusion and decompression at L5-S1 without residual spinal canal or neural foraminal stenosis. 3. Adjacent segment disease at L3-L4 resulting in at least moderate spinal canal stenosis and moderate bilateral neural foraminal stenosis.  CT of head from 08/27/23   IMPRESSION: 1. No significant interval change in bilateral frontal convexity subdural fluid collection measuring up to 6 mm. 2. Redemonstrated subarachnoid hemorrhage and hemorrhagic contusions along the bilateral anterior frontal lobes, slightly increased on the left measuring up 2.1 cm ( previously 1.9 cm), and unchanged along the right frontal lobe. Compared to prior exam there is interval increase in conspicuity of subarachnoid blood products along the bilateral parietal lobes, likely due to redistribution. No evidence of intraventricular extension. No hydrocephalus. 3. Redemonstrated acute nondisplaced fracture involving the right temporal bone.  VITALS  Vitals:   10/16/23 0824  BP: (!) 164/78   Patient just took BP medications about 30 min ago; asymptomatic  [place] Post Concussion Symptoms Questionnaire:  Compared to before the accident, do you now (last 24 hours) suffer from:  Headaches 0 = not experienced  Feeling of Dizziness 0 = not experienced  Nausea and/or vomiting 0 = not experienced  RPQ-3 (total of first 3 items)      Noise sensitivity (easily upset by loud noises) {[place]:29442}  Sleep disturbances {[place]:29442}  Fatigue, tiring more easily {[place]:29442}  Being irritable, easily angered: {[place]:29442}  Feeling depressed or tearful {[place]:29442}  Feeling frustrated or impatient {[place]:29442}  Forgetfulness, poor memory {[place]:29442}  Poor concentration {[place]:29442}  Taking longer to think  {[place]:29442}  Blurred vision {[place]:29442}  Light sensitivity (easily upset by bright light) {[place]:29442}  Double vision {[place]:29442}  Restlessness {[place]:29442}  RPQ-13 (total for next 13 items)     M-CTSIB  Condition 1: Firm Surface, EO 30 Sec, Normal Sway  Condition 2: Firm Surface, EC 30 Sec, Normal Sway  Condition 3: Foam Surface, EO 30 Sec, Normal Sway  Condition 4: Foam Surface, EC 30 Sec, improvement      [facility] PT Assessment - 10/16/23 0001       Standardized Balance Assessment   Standardized Balance Assessment Five Times Sit to Stand    Five times sit to stand comments  9.2   seconds without UE support (SBA)            OCULOMOTOR EXAM:               Ocular Alignment: normal  Ocular ROM: No Limitations               Spontaneous Nystagmus: absent               Gaze-Induced Nystagmus: absent               Smooth Pursuits: saccades    Saccades: hypometric/undershoots, hypormetric/undershoots, and worse with horizontal    Convergence/Divergence: 7 cm (without exotropia)                Test of Skew: no exotropia noted   TODAY'S TREATMENT:            PATIENT EDUCATION: Education details: continue HEP Person educated: Patient and Spouse Education method: Medical illustrator Education comprehension: verbalized understanding and needs further education  HOME EXERCISE PROGRAM:  Access Code: [email or web address] URL: [email or web address] Date: 09/08/2023 Prepared by: Maryruth Eve  Exercises - Corner Balance Feet Together With Eyes Closed  - 1 x daily - 7 x weekly - 3 sets - 30 seconds hold - Pencil Pushups  - 1 x daily - 7 x weekly - 3 sets - 10 reps - Seated Horizontal Saccades  - 1 x daily - 7 x weekly - 3 sets - 10 reps - Seated Vertical Saccades  - 1 x daily - 7 x weekly - 3 sets - 10 reps -[place] eye tracking (1 paragraph)  -King Devick's Saccades 1x a day   Education on SPOT it game and HART chart use,  VOR x 1 viewing 3 x 5 rounds Brock string ~3x a day with spouse guiding through   GOALS: Goals reviewed with patient? Yes  SHORT TERM GOALS: Target date: 09/29/2023   Pt will be independent with initial HEP for improved balance, transfers and gait.  Baseline: not established on eval; provided Goal status: MET  2.  Patient will improve RPQ-3 score to </= 2 per item to indicate reduced severity of post-concussive symptoms to progress towards PLOF.    Baseline: 3/4, 3/4 Goal status: NOT MET  3.  5x STS to be assessed and STG/LTG updated  Baseline: 15.65 seconds without UE use (SBA) Goal status: Discontinued STG due to how well performed   4.  Patient will improve mCTSIB score to 100/120 to indicate improved integration of vestibular, proprioceptive, and visual balance systems during static balance tasks in order to reduce risk for falls.   Baseline: 86/120; 120/120 Goal status: MET   LONG TERM GOALS: Target date: 10/27/2023   Pt will be independent with final HEP for improved balance, transfers and gait.  Baseline:  Goal status: INITIAL  2.  Patient will improve RPQ-13 score to </= 2 per item to indicate reduced severity of post-concussive symptoms to progress towards PLOF.   Baseline: greatest deficit reported 4/4  Goal status: INITIAL  3.  Patient will improve their 5x Sit to Stand score to less than 13 seconds to demonstrate a decreased risk for falls and improved LE strength.   Baseline: 15.65 seconds without UE use (SBA); 9.2 seconds without UE use (SBA) Goal status: MET  4. Patient will improve mCTSIB score to 120/120 to indicate improved integration of vestibular, proprioceptive, and visual balance systems during static balance tasks in order to reduce risk for falls.   Baseline: 86/120; 120/120  Goal status: MET   ASSESSMENT:  CLINICAL IMPRESSION: Patient seen for skilled PT session with emphasis on goal assessment. Sessions continue to be limited by how verbose  patient is and limited ability to redirect to task  throughout. RPQ score remains unchanged with greatest reports of HA. Safety awareness and impulsivity remain factors when prescribing home exercises and with limited ability to attend to task in therapy session, patient is making slower progress than one may expect. Continue POC as able.   OBJECTIVE IMPAIRMENTS: Abnormal gait, decreased activity tolerance, decreased balance, decreased cognition, decreased coordination, decreased endurance, decreased knowledge of condition, decreased knowledge of use of DME, decreased mobility, difficulty walking, decreased strength, decreased safety awareness, dizziness, impaired perceived functional ability, impaired vision/preception, and pain  ACTIVITY LIMITATIONS: carrying, lifting, bending, sleeping, stairs, transfers, hygiene/grooming, locomotion level, and caring for others  PARTICIPATION LIMITATIONS: meal prep, cleaning, laundry, interpersonal relationship, driving, shopping, community activity, occupation, and yard work  PERSONAL FACTORS: Fitness, Past/current experiences, and 3+ comorbidities: Bradycardia, post-concussion w/LOC,   are also affecting patient's functional outcome.   REHAB POTENTIAL: Good  CLINICAL DECISION MAKING: Unstable/unpredictable  EVALUATION COMPLEXITY: High  PLAN:  PT FREQUENCY: 2x/week  PT DURATION: 8 weeks  PLANNED INTERVENTIONS: Therapeutic exercises, Therapeutic activity, Neuromuscular re-education, Balance training, Gait training, Patient/Family education, Self Care, Joint mobilization, Stair training, Vestibular training, Canalith repositioning, DME instructions, Aquatic Therapy, Dry Needling, Electrical stimulation, Manual therapy, and Re-evaluation  PLAN FOR NEXT SESSION: progress occulomotor/vestibular exercises (VORx1 as tolerated, Brock string), progress corner balance (EC on foam, tandem with head turns as able etc), consider neuropthomology referral pending  progress; did OT/speech get scheduled, HART chart with balance work, work on Building surveyor with balance training or dynamic gait/balance without AD with visual scanning, midline orientation, assess goals  Carmelia Bake, PT Westley Foots, PT, DPT, CBIS  10/16/2023, 8:34 AM

## 2023-10-16 NOTE — Patient Outreach (Signed)
Care Management  Transitions of Care Program Transitions of Care Post-discharge week 2   10/16/2023 Name: Gregory [name] MRN: [id number] DOB: 07-27-1951  Subjective: Gregory [name] is a 72 y.o. year old male who is a primary care patient of Ronnald Nian, MD. The Care Management team Engaged with patient Engaged with patient by telephone to assess and address transitions of care needs.   Consent to Services:  Patient was given information about care management services, agreed to services, and gave verbal consent to participate.   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 He continues to receive PT/OT and SLP with [facility]. His dressing is c/d/I. He has EEG 10/28, a Wound check 10/30.  He has had no med changes.  Patient educated on red flags s/s to watch for and was encouraged to report any of these identified , any new symptoms , changes in baseline or  medication regimen,  change in health status  /  well-being, or safety concerns to PCP and / or the  [facility] Case Management team .          SDOH Interventions    Flowsheet Row Telephone from 10/08/2023 in [place] POPULATION HEALTH DEPARTMENT Clinical Support from 04/14/2023 in [facility] Care Coordination from 02/12/2023 in [facility] Clinical Support from 04/11/2022 in [facility] Chronic Care Management from 11/07/2021 in [facility]  SDOH Interventions       Food Insecurity Interventions -- Intervention Not Indicated Intervention Not Indicated -- --  Housing Interventions -- Intervention Not Indicated Intervention Not Indicated -- --  Transportation Interventions -- Intervention Not Indicated Intervention Not Indicated -- Intervention Not  Indicated  Utilities Interventions -- Intervention Not Indicated Intervention Not Indicated -- --  Alcohol Usage Interventions -- Intervention Not Indicated (Score <7) Intervention Not Indicated (Score <7) -- --  Depression Interventions/Treatment  -- PHQ2-9 Score <4 Follow-up Not Indicated -- PHQ2-9 Score <4 Follow-up Not Indicated --  Financial Strain Interventions -- Intervention Not Indicated Intervention Not Indicated -- Intervention Not Indicated  Physical Activity Interventions -- Intervention Not Indicated Intervention Not Indicated -- --  Stress Interventions -- Patient Refused -- -- --  Social Connections Interventions -- Intervention Not Indicated -- -- --  Health Literacy Interventions Intervention Not Indicated -- -- -- --        Goals Addressed             This Visit's Progress    TOC Care Plan       Current Barriers:  Medication management No missed medications, will report any adverse side effects Provider appointments Cardiology, Neurology  RNCM Clinical Goal(s):  Patient will verbalize understanding of plan for management of Atrial Fibrillation as evidenced by heart rate will remain between 60-100- occ tachy 108  take all medications exactly as prescribed and will call provider for medication related questions as evidenced by no unreported adverse side effect or unmanaged symptoms  attend all scheduled medical appointments: Cardiology, Neurology and PCP  as evidenced by no missed appointments , labs or testing   through collaboration with RN Care manager, provider, and care team.   Interventions: Evaluation of current treatment plan related to  self management and patient's adherence to  plan as established by provider  Transitions of Care:  Goal on track:  Yes. Doctor Visits  - discussed the importance of doctor visits Contacted provider for patient needs Schedule EEG - cardio 10/28, then 11/18 Wound check s/p PPM implant 10/30. Will call  Cardiologist for request  back to work, going to gym, Will get Flu vaccine  Patient Goals/Self-Care Activities: Participate in Transition of Care Program/Attend TOC scheduled calls Take all medications as prescribed Attend all scheduled provider appointments Call pharmacy for medication refills 3-7 days in advance of running out of medications Perform all self care activities independently  Perform IADL's (shopping, preparing meals, housekeeping, managing finances) independently Call provider office for new concerns or questions   Follow Up Plan:  Telephone follow up appointment with care management team member scheduled for:  10/23/23 @ 1:00pm The patient has been provided with contact information for the care management team and has been advised to call with any health related questions or concerns.          Plan: The patient has been provided with contact information for the care management team and has been advised to call with any health related questions or concerns.  Routine follow-up and on-going assessment evaluation and education of disease processes, recommended interventions for both chronic and acute medical conditions , will occur during each weekly visit along with ongoing review of symptoms ,medication reviews and reconciliation. Any updates , inconsistencies, discrepancies or acute care concerns will be addressed and routed to the correct Practitioner if indicated   Please refer to Care Plan for goals and interventions -Effectiveness of interventions, symptom management and outcomes will be evaluated  weekly during [facility] 30-day Program Outreach calls  . Any necessary  changes and updates to Care Plan will be completed episodically    Reviewed goals for care  Patient provided with Contact information and verbalized understanding with current POC.  [name] , BSN, RN Care Management Coordinator [place]   [facility] [email or web address] Direct Dial: [phone]

## 2023-10-16 NOTE — Therapy (Signed)
OUTPATIENT OCCUPATIONAL THERAPY TREATMENT  Patient Name: Gregory [name] MRN: [id number] DOB:[birth year], 72 y.o., male Today's Date: 10/16/2023  PCP: Ronnald Nian, MD REFERRING PROVIDER: Ronnald Nian, MD  END OF SESSION:  OT End of Session - 10/16/23 [id number]     Visit Number 3    Number of Visits 13    Date for OT Re-Evaluation 11/13/23    Authorization Type [employer]    Progress Note Due on Visit 10    OT Start Time 0845    OT Stop Time 0927    OT Time Calculation (min) 42 min    Equipment Utilized During Treatment gait belt    Activity Tolerance Patient tolerated treatment well    Behavior During Therapy WFL for tasks assessed/performed              Past Medical History:  Diagnosis Date   Alcohol abuse    Aortic stenosis    Arthritis    Asthma    exercise indused or pollen-uses inhaler dail yand has rescue inhaler if needed   Bipolar disorder (HCC)    CAD (coronary artery disease)    coronary calcifications 08/2013 CTA   Carotid artery occlusion    Cataract    bilateral sx   Chronic back pain    Claudication (HCC)    Complication of anesthesia 10/16/2020   "had a panic attack when the mask was put on me"" I do not think I was given anything  before, I need something to calm me down"   Depression    Family history of skin cancer    Heart murmur    as an infant   Hepatitis    h/o 1970,doesn't remember which type,1990 epstain-barr   Herpes zoster without complication 09/05/2019   Hyperlipidemia    Hypertension    Neuromuscular disorder (HCC)    Obsessive compulsive disorder    PAD (peripheral artery disease) (HCC)    Peripheral neuropathy    Pneumonia    RBBB    RBBB (right bundle branch block with left anterior fascicular block)    Seasonal allergies    Severe aortic stenosis    Sleep apnea    does not wear CPAP   Smoker    Spinal stenosis at L4-L5 level    Tobacco abuse    Tobacco use disorder    Past Surgical History:  Procedure  Laterality Date   ANAL FISTULECTOMY  09/25/2011   AORTA - BILATERAL FEMORAL ARTERY BYPASS GRAFT N/A 10/03/2020   Procedure: AORTA BIFEMORAL BYPASS GRAFT USING A HEMASHIELD GOLD BIFURCATED 14 X 7mm GRAFT AND INFERIOR MESENTERIC ARTERY REIMPLANTATION;  Surgeon: Nada Libman, MD;  Location: [facility] OR;  Service: Vascular;  Laterality: N/A;   AORTIC VALVE REPLACEMENT N/A 11/24/2019   Procedure: AORTIC VALVE REPLACEMENT (AVR) using INSPIRIS Resilia 23 MM Bioprosthetic Aortic Valve.;  Surgeon: Alleen Borne, MD;  Location: [facility] OR;  Service: Open Heart Surgery;  Laterality: N/A;   APPLICATION OF WOUND VAC Left 10/17/2020   Procedure: APPLICATION OF WOUND VAC;  Surgeon: Nada Libman, MD;  Location: [facility] OR;  Service: Vascular;  Laterality: Left;   BACK SURGERY     2015   CATARACT EXTRACTION, BILATERAL  2022   COLONOSCOPY  2009   MS-F/V-mov(exc)-HPP   CORONARY ARTERY BYPASS GRAFT N/A 11/24/2019   Procedure: CORONARY ARTERY BYPASS GRAFTING (CABG) using LIMA to LAD.;  Surgeon: Alleen Borne, MD;  Location: [facility];  Service: Open Heart Surgery;  Laterality: N/A;  ELBOW SURGERY     bilaterally for cubital tunnel   ENDARTERECTOMY N/A 10/03/2020   Procedure: AORTIC ENDARTERECTOMY;  Surgeon: Nada Libman, MD;  Location: [facility];  Service: Vascular;  Laterality: N/A;   ENDARTERECTOMY FEMORAL Bilateral 10/03/2020   Procedure: BILATERAL FEMORAL ENDARTERECTOMY;  Surgeon: Nada Libman, MD;  Location: [facility];  Service: Vascular;  Laterality: Bilateral;   HERNIA REPAIR     with mesh   INCISION AND DRAINAGE Left 01/02/2021   Procedure: INCISION AND DRAINAGE LEFT GROIN WITH STIMULAN BEADS;  Surgeon: Nada Libman, MD;  Location: [facility];  Service: Vascular;  Laterality: Left;   INCISION AND DRAINAGE OF WOUND Left 10/17/2020   Procedure: EXPLORATION LEFT GROIN WOUND;  Surgeon: Nada Libman, MD;  Location: [facility];  Service: Vascular;  Laterality: Left;   MAXIMUM ACCESS (MAS)POSTERIOR LUMBAR INTERBODY  FUSION (PLIF) 1 LEVEL N/A 10/25/2014   Procedure: LUMBAR FOUR TO FIVE MAXIMUM ACCESS (MAS) POSTERIOR LUMBAR INTERBODY FUSION (PLIF) 1 LEVEL;  Surgeon: Tia Alert, MD;  Location: [facility];  Service: Neurosurgery;  Laterality: N/A;   PACEMAKER IMPLANT N/A 10/06/2023   Procedure: PACEMAKER IMPLANT;  Surgeon: Regan Lemming, MD;  Location: [facility];  Service: Cardiovascular;  Laterality: N/A;   RIGHT/LEFT HEART CATH AND CORONARY ANGIOGRAPHY N/A 11/10/2019   Procedure: RIGHT/LEFT HEART CATH AND CORONARY ANGIOGRAPHY;  Surgeon: [place], [name], MD;  Location: [facility] INVASIVE CV LAB;  Service: Cardiovascular;  Laterality: N/A;   SKIN BIOPSY Left 10/12/2018   shave forehead Hypertrophic actinic kertosis with features of a verruca   TEE WITHOUT CARDIOVERSION N/A 11/24/2019   Procedure: TRANSESOPHAGEAL ECHOCARDIOGRAM (TEE);  Surgeon: Alleen Borne, MD;  Location: [facility] OR;  Service: Open Heart Surgery;  Laterality: N/A;   TONSILLECTOMY  age 64   VASECTOMY     X 2   VASECTOMY REVERSAL     Patient Active Problem List   Diagnosis Date Noted   Symptomatic bradycardia 10/05/2023   Subarachnoid hemorrhage following injury (HCC) 08/26/2023   Skull fracture with cerebral contusion (HCC) 08/26/2023   Temporal bone fracture (HCC) 08/26/2023   Aortic atherosclerosis (HCC) 05/26/2023   Acute non-recurrent pansinusitis 01/04/2023   Perennial allergic rhinitis 07/09/2022   Asthma-COPD overlap syndrome (HCC) 04/21/2022   Recurrent infections 04/21/2022   S/P aortobifemoral bypass surgery 04/03/2021   S/P CABG x 1 11/24/2019   S/P aortic valve replacement with bioprosthetic valve 11/24/2019   PAD (peripheral artery disease) (HCC)    Mild persistent asthma 10/29/2019   RBBB (right bundle branch block with left anterior fascicular block)    Peripheral neuropathy    S/P lumbar spinal fusion 10/25/2014   Recovering alcoholic in remission (HCC) 08/10/2014   Substance induced mood disorder (HCC)  06/30/2014   Substance-induced sleep disorder (HCC) 06/30/2014   Obsessive compulsive disorder    OSA (obstructive sleep apnea) 01/20/2014   CAD (coronary artery disease)    Mentally disabled 11/25/2012   Hyperlipidemia with target LDL less than 130 11/25/2012   Bipolar disorder (HCC)    Hypertension    Allergic rhinitis due to pollen 08/11/2011    ONSET DATE: 09/15/2023 (referral date)   REFERRING DIAG: S06.9X1D (ICD-10-CM) - Traumatic brain injury, with loss of consciousness of 30 minutes or less, subsequent encounter R41.3 (ICD-10-CM) - Memory deficit  THERAPY DIAG:  Unsteadiness on feet  Muscle weakness (generalized)  Visuospatial deficit  Other symptoms and signs involving cognitive functions following nontraumatic subarachnoid hemorrhage  Rationale for Evaluation and Treatment:  Rehabilitation  SUBJECTIVE:   SUBJECTIVE STATEMENT: Pt reports completing visual scanning strategies and "looking all around." Pt reports using walker at home, pt's spouse clarified "most of the time." Pt and pt's spouse reported new pacemaker precautions (see Precautions below).  Pt accompanied by:  Spouse  PERTINENT HISTORY: presenting after syncopal event and fall on 08/25/23. Imaging showed acute subarachnoid hemorrhage with 1.8 cm inferior left frontal lobe hemorrhagic contusion without substantial mass effect and nondisplaced fracture of the right temporoparietal calvarium. PMH: CABG, aortic valve replacement, HTN, HLD, bipolar, bradycardia  Update - Pacemaker implant on 10/06/23  PRECAUTIONS: Other: no driving, fall risk, syncope, pacemaker implant 10/06/23  10/16/23 update - Pt and pt's spouse reported new precautions d/t pacemaker implant s/p 10/06/23: LUE - no lift/pull/push > 8-10 lbs. Pt to f/u with MD about precautions in early November 2024.  WEIGHT BEARING RESTRICTIONS: No  PAIN:  Are you having pain? No  FALLS: Has patient fallen in last 6 months? Yes. 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  HAND DOMINANCE: Left  ADLs: Eating: assist to cut food occasionally Grooming: independent UB Dressing: independent LB Dressing: independent Toileting: independent (raised) Bathing: mod I - standing some, seated some Tub Shower transfers: mod I  Equipment: Shower seat with back  IADLs: Shopping: pt goes with wife (pt used to do) Light housekeeping: wife always does majority, but pt has not returned to full laundry tasks Meal Prep: wife doing now for safety reasons, but both pt and wife took turns prior to injury Community mobility: pt currently not driving since injury Medication management: independent Financial management: wife has always done  Handwriting:  pt reports always been horrible - no changes.   MOBILITY STATUS:  uses walker   ACTIVITY TOLERANCE: Activity tolerance: fatigues quicker   UPPER EXTREMITY ROM:  BUE AROM WFL's - pt has flexed ring and small fingers Lt hand from old injury but can fully open for short amounts of time   UPPER EXTREMITY MMT:   RUE 5/5 grossly, LUE 4+/5 grossly at shoulders   HAND FUNCTION: Grip strength: Right: 57.5 lbs; Left: 61.9 lbs  COORDINATION: 9 Hole Peg test: Right: 36.06 sec; Left: 35.92 sec (pt denies change and reports coordination bilaterally has been slightly slower for some time)   SENSATION: WFL Per pt report  EDEMA: none   COGNITION: Overall cognitive status:  difficult to assess, ? Safety awareness and executive functioning - see speech evaluation for details  VISION: Subjective report: not sure about visual  changes, denies diplopia but wife reports issues with saccades and P.T. gave ex's for this and convergence Baseline vision: Wears glasses for reading only Visual history: corrective eye surgery for cataracts  VISION ASSESSMENT: TBA - d/t time constraints during evaluation  10/02/23 update - Pt accurately read clock on wall. Pt wears reading glasses, pt reports hx of cataract surgery, and pt reports need to update prescription d/t difficulty seeing objects far away. Pt and pt's spouse reported changes with vision throughout the 3 weeks after the fall event, such as paint chips on wall appearing as "floating" and moving through pt's vision. However, pt reports "floaters" have mostly  resolved and has not noticed floaters recently.  Ocular movements when tracking - Mild atypical movements of both eyes while tracking a marker in all directions and more significant "jumping" of eye movements intermittently. Pt's spouse reports eye movements "saccades" are greatly improved compared to immediately after the fall.  Peripheral vision approx. 50* on both R and L. Superior vision WFL.  Letter cancellation - Pt correctly identified 100% of letters in 4 out of 4 lines for identifying letters uppercase B, D, E, and F.    PERCEPTION: Not tested  PRAXIS: Not tested  OBSERVATIONS: very verbose, occasional redirection needed, ? Safety awareness.    TODAY'S TREATMENT:                                                                                                                               Self-care OT educated on importance of using walker. Pt verbalized understanding. Pt reports leaving walker in visible areas to remember to use walker. Pt continued to benefit from v/c today to keep walker nearby.  Standing simulated dishwashing task - CGA with gait belt - to increase ind with IADL household management tasks, BUE coordination/strengthening within pacemaker placement precautions, navigating environment  with walker or having stable surface nearby (e.g. countertops) - Pt navigated ADL kitchen to rinse dishes then place dishes in dishwasher. Pt tolerated task well.  Standing, cutting (pink) theraputty with knife, including adaptive knives and other strategies to improve safety - CGA with gait belt - to increase ind with IADL meal prep tasks, to improve safety when cutting - OT educated pt on adaptive knives, including rocker knife. Pt trialed using a adaptive rocker knife though pt verbalized "this is just unwieldy." Pt described using knife at home and stabilizing food with RUE directly on food, and OT noted that pt positioned RUE near knife blade. D/t safety concerns, OT therefore educated on strategy to hold fork with RUE to stabilize food in order increase distance between knife blade and RUE, which increases safety. Pt verbalized and demo'd understanding of all education. Throughout task, pt benefited from v/c for safety and hand positioning on adaptive utensils/knife/fork during cutting tasks to improve efficiency.  Therapeutic Activities Medium pegs on pegboard to create pattern - Pt placed x20 pegs with dominant LUE and x16 with RUE. Decreased coordination with LUE compared to RUE noted as evidenced by increased time and x2 drops. Pt tolerated task well.    PATIENT EDUCATION: Education details: Dentist when lifting heavier items (e.g. ceramic dish) - keep items close to center. Proximal stability/core stability. Walker safety at home and Financial trader. Strategies to decrease fall risk. Adaptive rocker knife and other adaptive knife options to increase safety. Using fork to stabilize food using RUE to increase safety when cutting with knife using LUE. Using butter knife and very soft foods at home to practice cutting task with fork and knife until improved safety with utensils is established and pacemaker precautions are lifted.  Pacemaker precautions. Person educated: Patient  and Spouse Education method: Explanation, Demonstration, and Verbal cues Education comprehension: verbalized understanding, returned demonstration, verbal cues required, and needs further education  HOME EXERCISE PROGRAM: N/A   GOALS: Goals reviewed with patient? Yes  SHORT TERM GOALS: Target date: 10/21/23  Pt to be independent with HEP for overall strength/endurance UEs Baseline: Goal status: INITIAL  2.  Pt to return to standing for minimum of 10 minutes to perform light IADL's (folding clothes, washing dishes)  Baseline:  Goal status: INITIAL  3.  Pt to verbalize understanding with safety considerations for fall prevention during IADLS including appropriate use of walker for kitchen negotiation Baseline:  Goal status: INITIAL    LONG TERM GOALS: Target date: 11/13/23  Pt to perform environmental scanning in moderately distracting gym with 85% accuracy or greater in prep for potential return to driving Baseline:  Goal status: INITIAL  2.  Pt to demo safety w/ simple cooking task with direct supervision but no physical assist Baseline:  Goal status: INITIAL  3.  10/02/23 Update following additional vision assessment -  Patient will demonstrate at least 85% accuracy with environmental visual scanning to assist with ADLs and IADLS including potential driving considerations.   Baseline:  Goal status: INITIAL   ASSESSMENT:  CLINICAL IMPRESSION: Pt demo'd great standing tolerance during functional IADL meal prep and dishwashing tasks today as evidenced by standing for approx. 30-35 minutes. Pt continues to benefit from v/c for safety during functional tasks. Recommended to continue education regarding safety with walker, safety with meal prep. Pt would benefit from skilled OT services in the outpatient setting to work on impairments as noted below to help pt return to PLOF as able.     PERFORMANCE DEFICITS: in functional skills including IADLs, strength, Fine motor  control, mobility, balance, endurance, cardiopulmonary status limiting function, decreased knowledge of precautions, decreased knowledge of use of DME, vision, and UE functional use, cognitive skills including problem solving and safety awareness, and psychosocial skills including coping strategies.   IMPAIRMENTS: are limiting patient from IADLs, work, and leisure.   CO-MORBIDITIES: may have co-morbidities  that affects occupational performance. Patient will benefit from skilled OT to address above impairments and improve overall function.  MODIFICATION OR ASSISTANCE TO COMPLETE EVALUATION: No modification of tasks or assist necessary to complete an evaluation.  OT OCCUPATIONAL PROFILE AND HISTORY: Problem focused assessment: Including review of records relating to presenting problem.  CLINICAL DECISION MAKING: Moderate - several treatment options, min-mod task modification necessary  REHAB POTENTIAL: Good  EVALUATION COMPLEXITY: Low    PLAN:  OT FREQUENCY: 2x/week  OT DURATION: 6 weeks, plus evaluation  PLANNED INTERVENTIONS: self care/ADL training, therapeutic exercise, therapeutic activity, neuromuscular re-education, manual therapy, passive range of motion, functional mobility training, splinting, patient/family education, cognitive remediation/compensation, visual/perceptual remediation/compensation, coping strategies training, DME and/or AE instructions, and Re-evaluation  RECOMMENDED OTHER SERVICES: none at this time  CONSULTED AND AGREED WITH PLAN OF CARE: Patient and spouse  PLAN FOR NEXT SESSION:   Work on endurance, UE strengthening (within new pacemaker precautions), standing tolerance - folding clothes Continue to review visual scanning strategies and safety awareness   Wynetta Emery, OT 10/16/2023, 9:55 AM

## 2023-10-19 ENCOUNTER — Ambulatory Visit (HOSPITAL_COMMUNITY): Payer: PPO | Attending: Cardiology

## 2023-10-19 ENCOUNTER — Encounter: Payer: Self-pay | Admitting: Physical Therapy

## 2023-10-19 DIAGNOSIS — Z953 Presence of xenogenic heart valve: Secondary | ICD-10-CM | POA: Insufficient documentation

## 2023-10-19 DIAGNOSIS — Z952 Presence of prosthetic heart valve: Secondary | ICD-10-CM

## 2023-10-19 LAB — ECHOCARDIOGRAM COMPLETE
AV Mean grad: 9 mm[Hg]
AV Peak grad: 20.3 mm[Hg]
Ao pk vel: 2.25 m/s
Area-P 1/2: 1.86 cm2
S' Lateral: 2.1 cm

## 2023-10-20 ENCOUNTER — Ambulatory Visit: Payer: PPO | Admitting: Physical Therapy

## 2023-10-20 ENCOUNTER — Telehealth: Payer: Self-pay | Admitting: Cardiology

## 2023-10-20 ENCOUNTER — Ambulatory Visit: Payer: PPO | Admitting: Occupational Therapy

## 2023-10-20 ENCOUNTER — Encounter: Payer: PPO | Admitting: Occupational Therapy

## 2023-10-20 NOTE — Telephone Encounter (Signed)
Received monitor report from [place] with [facility]. Patient diary entries are as follows:  10/4 10:01am Mobitz 2  Rate 38bmp  Lasting 33sec    10/4 10:05am Mobitz 2 Rate 32bpm Lasting 63.1sec   10/4 10:10am Mobitz 2 Rate 34bpm Lasting 42.4sec    Tried calling patient to assess current symptoms but he did not answer. LVMTCB on his phone # and his wifes #.

## 2023-10-20 NOTE — Telephone Encounter (Signed)
Zio calling with additional findings on the pt's monitor. Please advise

## 2023-10-20 NOTE — Therapy (Unsigned)
OUTPATIENT SPEECH LANGUAGE PATHOLOGY TREATMENT   Patient Name: Gregory [name] MRN: [id number] DOB:December 01, 1951, 72 y.o., male Today's Date: 10/21/2023  PCP: Ronnald Nian, MD REFERRING PROVIDER: Ronnald Nian, MD  END OF SESSION:  End of Session - 10/21/23 1148     Visit Number 3    Number of Visits 18    Date for SLP Re-Evaluation 11/25/23    SLP Start Time 1019    SLP Stop Time  1103    SLP Time Calculation (min) 44 min    Activity Tolerance Patient tolerated treatment well              Past Medical History:  Diagnosis Date   Alcohol abuse    Aortic stenosis    Arthritis    Asthma    exercise indused or pollen-uses inhaler dail yand has rescue inhaler if needed   Bipolar disorder (HCC)    CAD (coronary artery disease)    coronary calcifications 08/2013 CTA   Carotid artery occlusion    Cataract    bilateral sx   Chronic back pain    Claudication (HCC)    Complication of anesthesia 10/16/2020   "had a panic attack when the mask was put on me"" I do not think I was given anything  before, I need something to calm me down"   Depression    Family history of skin cancer    Heart murmur    as an infant   Hepatitis    h/o 1970,doesn't remember which type,1990 epstain-barr   Herpes zoster without complication 09/05/2019   Hyperlipidemia    Hypertension    Neuromuscular disorder (HCC)    Obsessive compulsive disorder    PAD (peripheral artery disease) (HCC)    Peripheral neuropathy    Pneumonia    RBBB    RBBB (right bundle branch block with left anterior fascicular block)    Seasonal allergies    Severe aortic stenosis    Sleep apnea    does not wear CPAP   Smoker    Spinal stenosis at L4-L5 level    Tobacco abuse    Tobacco use disorder    Past Surgical History:  Procedure Laterality Date   ANAL FISTULECTOMY  09/25/2011   AORTA - BILATERAL FEMORAL ARTERY BYPASS GRAFT N/A 10/03/2020   Procedure: AORTA BIFEMORAL BYPASS GRAFT USING A HEMASHIELD  GOLD BIFURCATED 14 X 7mm GRAFT AND INFERIOR MESENTERIC ARTERY REIMPLANTATION;  Surgeon: Nada Libman, MD;  Location: [facility] OR;  Service: Vascular;  Laterality: N/A;   AORTIC VALVE REPLACEMENT N/A 11/24/2019   Procedure: AORTIC VALVE REPLACEMENT (AVR) using INSPIRIS Resilia 23 MM Bioprosthetic Aortic Valve.;  Surgeon: Alleen Borne, MD;  Location: [facility] OR;  Service: Open Heart Surgery;  Laterality: N/A;   APPLICATION OF WOUND VAC Left 10/17/2020   Procedure: APPLICATION OF WOUND VAC;  Surgeon: Nada Libman, MD;  Location: [facility] OR;  Service: Vascular;  Laterality: Left;   BACK SURGERY     2015   CATARACT EXTRACTION, BILATERAL  2022   COLONOSCOPY  2009   MS-F/V-mov(exc)-HPP   CORONARY ARTERY BYPASS GRAFT N/A 11/24/2019   Procedure: CORONARY ARTERY BYPASS GRAFTING (CABG) using LIMA to LAD.;  Surgeon: Alleen Borne, MD;  Location: [facility] OR;  Service: Open Heart Surgery;  Laterality: N/A;   ELBOW SURGERY     bilaterally for cubital tunnel   ENDARTERECTOMY N/A 10/03/2020   Procedure: AORTIC ENDARTERECTOMY;  Surgeon: Nada Libman, MD;  Location: [facility];  Service: Vascular;  Laterality: N/A;   ENDARTERECTOMY FEMORAL Bilateral 10/03/2020   Procedure: BILATERAL FEMORAL ENDARTERECTOMY;  Surgeon: Nada Libman, MD;  Location: [facility];  Service: Vascular;  Laterality: Bilateral;   HERNIA REPAIR     with mesh   INCISION AND DRAINAGE Left 01/02/2021   Procedure: INCISION AND DRAINAGE LEFT GROIN WITH STIMULAN BEADS;  Surgeon: Nada Libman, MD;  Location: [facility];  Service: Vascular;  Laterality: Left;   INCISION AND DRAINAGE OF WOUND Left 10/17/2020   Procedure: EXPLORATION LEFT GROIN WOUND;  Surgeon: Nada Libman, MD;  Location: [facility];  Service: Vascular;  Laterality: Left;   MAXIMUM ACCESS (MAS)POSTERIOR LUMBAR INTERBODY FUSION (PLIF) 1 LEVEL N/A 10/25/2014   Procedure: LUMBAR FOUR TO FIVE MAXIMUM ACCESS (MAS) POSTERIOR LUMBAR INTERBODY FUSION (PLIF) 1 LEVEL;  Surgeon: Tia Alert, MD;   Location: [facility];  Service: Neurosurgery;  Laterality: N/A;   PACEMAKER IMPLANT N/A 10/06/2023   Procedure: PACEMAKER IMPLANT;  Surgeon: Regan Lemming, MD;  Location: [facility] IN[facility]AS[facility];  Service: Cardiovascular;  Laterality: N/A;   RIGHT/LEFT HEART CATH AND CORONARY ANGIOGRAPHY N/A 11/10/2019   Procedure: RIGHT/LEFT HEART CATH AND CORONARY ANGIOGRAPHY;  Surgeon: Swaziland, [name], MD;  Location: [facility] INVASIVE CV LAB;  Service: Cardiovascular;  Laterality: N/A;   SKIN BIOPSY Left 10/12/2018   shave forehead Hypertrophic actinic kertosis with features of a verruca   TEE WITHOUT CARDIOVERSION N/A 11/24/2019   Procedure: TRANSESOPHAGEAL ECHOCARDIOGRAM (TEE);  Surgeon: Alleen Borne, MD;  Location: [facility] OR;  Service: Open Heart Surgery;  Laterality: N/A;   TONSILLECTOMY  age 1   VASECTOMY     X 2   VASECTOMY REVERSAL     Patient Active Problem List   Diagnosis Date Noted   Symptomatic bradycardia 10/05/2023   Subarachnoid hemorrhage following injury (HCC) 08/26/2023   Skull fracture with cerebral contusion (HCC) 08/26/2023   Temporal bone fracture (HCC) 08/26/2023   Aortic atherosclerosis (HCC) 05/26/2023   Acute non-recurrent pansinusitis 01/04/2023   Perennial allergic rhinitis 07/09/2022   Asthma-COPD overlap syndrome (HCC) 04/21/2022   Recurrent infections 04/21/2022   S/P aortobifemoral bypass surgery 04/03/2021   S/P CABG x 1 11/24/2019   S/P aortic valve replacement with bioprosthetic valve 11/24/2019   PAD (peripheral artery disease) (HCC)    Mild persistent asthma 10/29/2019   RBBB (right bundle branch block with left anterior fascicular block)    Peripheral neuropathy    S/P lumbar spinal fusion 10/25/2014   Recovering alcoholic in remission (HCC) 08/10/2014   Substance induced mood disorder (HCC) 06/30/2014   Substance-induced sleep disorder (HCC) 06/30/2014   Obsessive compulsive disorder    OSA (obstructive sleep apnea) 01/20/2014   CAD (coronary artery  disease)    Mentally disabled 11/25/2012   Hyperlipidemia with target LDL less than 130 11/25/2012   Bipolar disorder (HCC)    Hypertension    Allergic rhinitis due to pollen 08/11/2011    ONSET DATE: 08/25/23   REFERRING DIAG: Z61.9X1D (ICD-10-CM) - Traumatic brain injury, with loss of consciousness of 30 minutes or less, subsequent encounter R41.3 (ICD-10-CM) - Memory deficit  THERAPY DIAG: Cognitive communication deficit  Rationale for Evaluation and Treatment: Rehabilitation  SUBJECTIVE:   SUBJECTIVE STATEMENT: Reported progress since last session as well as some helpful re-framing of health journey/recovery Pt accompanied by: significant other Chales Abrahams  PERTINENT HISTORY: Pt reports long history of cardiac issues/dizziness, but has never lost consciousness. Pt reports he felt dizzy and was reaching up at  an aisle in [employer]. Lost consciousness and fell backwards onto the concrete floor and his his head. Does not know how long he was unconscious, was found by another shopper. Has a history of bradycardia, wore a heart monitor for 2 weeks and was cleared by his cardiologist in August to exercise prior to this incident. Works as a Investment banker, operational at [employer] and is used to weightlifting/cardio several times per week.   PAIN:  Are you having pain? No  FALLS: Has patient fallen in last 6 months?  See PT evaluation for details  LIVING ENVIRONMENT: Lives with: lives with their spouse Lives in: House/apartment  PLOF:  Level of assistance: Independent with ADLs, Independent with IADLs Employment: Part-time employment  PATIENT GOALS: "To go back to work and drive"  OBJECTIVE:   TODAY'S TREATMENT:                                                                                                                                         10/21/23: Endorsed feeling frustrated with management of fluctuating schedule, numerous appointments, and health journey in general. Demonstrated ability  to re-frame and reduce frustration re: therapeutic process and recovery expectations, which has aided mental clarity. Identified he was previously successful when structure and rigid framework in place (ex: work) versus daily schedule differences. Recommended using prior strengths to modify/compensate for current cognitive challenges, such as using visual aids and verbal sequencing to aid retention and attention. Endorsed some difficulty mult-taking at home, resulting in deviations in attention and forgetting to use walker. Targeted meta-cognitive analysis and cognitive strategy selection with pt able to ID appropriate solution with min prompting. Pt able demo and carryover targeted solution (enclosing self with walker) with rare min A in stimulating environment. Reported overall good carryover of targeted recommendations from last session, which aided safety at home.    10/14/23: Pt experienced medical event since prior evaluation session and had a pacemaker implanted. Endorses use of walker and limited challenges, although wife reports otherwise. Wife stated that pt is not using walker consistently, such as walking by it when leaving couch, leaving it in one kitchen door and exiting out the other, and not bringing it to the bathroom. Pt agreed with wife's statements and commented "out of sight out of mind." SLP led pt and wife in generating functional solutions, with wife stating she can place chair in second kitchen doorway as a visual cue to use the other one (where walker is placed). Additionally SLP recommended keeping walker within a couple feet at all times, such as when watching tv on the couch. Pt reports returning to simple cooking tasks with wife assisting with cutting, as pt cannot use knife. Demonstrated anticipatory awareness skills when noting that 3-minutes was too long for toast (it burned) and that he would set the timer for less next time.   09/30/23: Reviewed goals - eval only  PATIENT  EDUCATION: Education details: See Today's Treatment, See Patient Instructions, compensations for cognition, accommodations in workplace Person educated: Patient and Spouse Education method: Explanation and Verbal cues Education comprehension: verbal cues required and needs further education   GOALS: Goals reviewed with patient? Yes  SHORT TERM GOALS: Target date: 10/28/23  Pt will carryover 2 strategies for attention to successfully carryover household tasks that require alternating attention with supervision cues.  Baseline: Goal status: IN PROGRESS  2.  Pt will make safe decisions at home during household tasks with rare min A Baseline:  Goal status: IN PROGRESS  3.  Pt will verbalize 3 compensations for attention when driving (for if/when he is cleared my MD to drive) Baseline:  Goal status: IN PROGRESS  4. Pt will verbalize safety awareness in kitchen, garden, and work with rare min A             Goal status: IN PROGRESS   LONG TERM GOALS: Target date: 11/25/23  Pt will carryover compensations for attention on divided attention tasks at home Baseline:  Goal status: IN PROGRESS  2.  Pt will verbalize 3 strategies to support alternating and divided attention at work with rare min A Baseline:  Goal status: IN PROGRESS  3.  Pt will verbalize 3 accommodations to support cognition and energy conservation upon return to work Baseline:  Goal status: IN PROGRESS  ASSESSMENT:  CLINICAL IMPRESSION: Patient is a 72 y.o. male who was seen today for cognition s/p TBI and fall. Today he presents with mild high level cognitive impairment. He is accompanied by his spouse, Chales Abrahams. She reports concern re: his safety awareness as he occasionally forgets to use his walker. He demonstrates increasing safety awareness in a variety of situations. He hopes to return to work and short distance driving. SLP conducting ongoing education of cognitive strategies and assisted in problem-solving  safety situations. Pt will continue to benefit from ST to maximize safety for possible return to work, household tasks, and possible return to driving.   OBJECTIVE IMPAIRMENTS: include attention and awareness. These impairments are limiting patient from return to work and household responsibilities. Factors affecting potential to achieve goals and functional outcome are  n/a .[name] Patient will benefit from skilled SLP services to address above impairments and improve overall function.  REHAB POTENTIAL: Good  PLAN:  SLP FREQUENCY: 2x/week  SLP DURATION: 8 weeks  PLANNED INTERVENTIONS: Environmental controls, Cueing hierachy, Cognitive reorganization, Internal/external aids, Functional tasks, and Multimodal communication approach    Gracy Racer, CCC-SLP 10/21/2023, 11:48 AM

## 2023-10-21 ENCOUNTER — Ambulatory Visit: Payer: PPO | Admitting: Physical Therapy

## 2023-10-21 ENCOUNTER — Ambulatory Visit: Payer: PPO | Attending: Cardiology

## 2023-10-21 ENCOUNTER — Ambulatory Visit: Payer: PPO

## 2023-10-21 VITALS — BP 160/67 | HR 61

## 2023-10-21 DIAGNOSIS — R2681 Unsteadiness on feet: Secondary | ICD-10-CM | POA: Diagnosis not present

## 2023-10-21 DIAGNOSIS — M6281 Muscle weakness (generalized): Secondary | ICD-10-CM

## 2023-10-21 DIAGNOSIS — R001 Bradycardia, unspecified: Secondary | ICD-10-CM | POA: Diagnosis not present

## 2023-10-21 DIAGNOSIS — R2689 Other abnormalities of gait and mobility: Secondary | ICD-10-CM

## 2023-10-21 DIAGNOSIS — R41841 Cognitive communication deficit: Secondary | ICD-10-CM

## 2023-10-21 LAB — CUP PACEART INCLINIC DEVICE CHECK
Battery Remaining Longevity: 117 mo
Battery Voltage: 3.07 V
Brady Statistic RA Percent Paced: 86 %
Brady Statistic RV Percent Paced: 49 %
Date Time Interrogation Session: 20241030165343
Implantable Lead Connection Status: 753985
Implantable Lead Connection Status: 753985
Implantable Lead Implant Date: 20241015
Implantable Lead Implant Date: 20241015
Implantable Lead Location: 753859
Implantable Lead Location: 753860
Implantable Pulse Generator Implant Date: 20241015
Lead Channel Impedance Value: 437.5 Ohm
Lead Channel Impedance Value: 475 Ohm
Lead Channel Pacing Threshold Amplitude: 0.75 V
Lead Channel Pacing Threshold Amplitude: 0.75 V
Lead Channel Pacing Threshold Amplitude: 0.75 V
Lead Channel Pacing Threshold Amplitude: 0.75 V
Lead Channel Pacing Threshold Pulse Width: 0.5 ms
Lead Channel Pacing Threshold Pulse Width: 0.5 ms
Lead Channel Pacing Threshold Pulse Width: 0.5 ms
Lead Channel Pacing Threshold Pulse Width: 0.5 ms
Lead Channel Sensing Intrinsic Amplitude: 3.4 mV
Lead Channel Sensing Intrinsic Amplitude: 5 mV
Lead Channel Setting Pacing Amplitude: 1 V
Lead Channel Setting Pacing Amplitude: 1.875
Lead Channel Setting Pacing Pulse Width: 0.5 ms
Lead Channel Setting Sensing Sensitivity: 0.7 mV
Pulse Gen Model: 2272
Pulse Gen Serial Number: 8218807

## 2023-10-21 NOTE — Therapy (Signed)
OUTPATIENT PHYSICAL THERAPY NEURO TREATMENT   Patient Name: BANX SCHACHT MRN: [id number] DOB:1951-01-01, 72 y.o., male Today's Date: 10/21/2023   PCP: Ronnald Nian, MD REFERRING PROVIDER: Willeen Niece, MD  END OF SESSION:  PT End of Session - 10/21/23 0939     Visit Number 11    Number of Visits 18   updated cert with new return orders   Date for PT Re-Evaluation 12/07/23    Authorization Type [employer]    PT Start Time 0935    PT Stop Time 1016    PT Time Calculation (min) 41 min    Equipment Utilized During Treatment Gait belt    Activity Tolerance Patient tolerated treatment well    Behavior During Therapy WFL for tasks assessed/performed            Past Medical History:  Diagnosis Date   Alcohol abuse    Aortic stenosis    Arthritis    Asthma    exercise indused or pollen-uses inhaler dail yand has rescue inhaler if needed   Bipolar disorder (HCC)    CAD (coronary artery disease)    coronary calcifications 08/2013 CTA   Carotid artery occlusion    Cataract    bilateral sx   Chronic back pain    Claudication (HCC)    Complication of anesthesia 10/16/2020   "had a panic attack when the mask was put on me"" I do not think I was given anything  before, I need something to calm me down"   Depression    Family history of skin cancer    Heart murmur    as an infant   Hepatitis    h/o 1970,doesn't remember which type,1990 epstain-barr   Herpes zoster without complication 09/05/2019   Hyperlipidemia    Hypertension    Neuromuscular disorder (HCC)    Obsessive compulsive disorder    PAD (peripheral artery disease) (HCC)    Peripheral neuropathy    Pneumonia    RBBB    RBBB (right bundle branch block with left anterior fascicular block)    Seasonal allergies    Severe aortic stenosis    Sleep apnea    does not wear CPAP   Smoker    Spinal stenosis at L4-L5 level    Tobacco abuse    Tobacco use disorder    Past Surgical History:   Procedure Laterality Date   ANAL FISTULECTOMY  09/25/2011   AORTA - BILATERAL FEMORAL ARTERY BYPASS GRAFT N/A 10/03/2020   Procedure: AORTA BIFEMORAL BYPASS GRAFT USING A HEMASHIELD GOLD BIFURCATED 14 X 7mm GRAFT AND INFERIOR MESENTERIC ARTERY REIMPLANTATION;  Surgeon: Nada Libman, MD;  Location: [facility] OR;  Service: Vascular;  Laterality: N/A;   AORTIC VALVE REPLACEMENT N/A 11/24/2019   Procedure: AORTIC VALVE REPLACEMENT (AVR) using INSPIRIS Resilia 23 MM Bioprosthetic Aortic Valve.;  Surgeon: Alleen Borne, MD;  Location: [facility] OR;  Service: Open Heart Surgery;  Laterality: N/A;   APPLICATION OF WOUND VAC Left 10/17/2020   Procedure: APPLICATION OF WOUND VAC;  Surgeon: Nada Libman, MD;  Location: [facility] OR;  Service: Vascular;  Laterality: Left;   BACK SURGERY     2015   CATARACT EXTRACTION, BILATERAL  2022   COLONOSCOPY  2009   MS-F/V-mov(exc)-HPP   CORONARY ARTERY BYPASS GRAFT N/A 11/24/2019   Procedure: CORONARY ARTERY BYPASS GRAFTING (CABG) using LIMA to LAD.;  Surgeon: Alleen Borne, MD;  Location: [facility] OR;  Service: Open Heart Surgery;  Laterality: N/A;  ELBOW SURGERY     bilaterally for cubital tunnel   ENDARTERECTOMY N/A 10/03/2020   Procedure: AORTIC ENDARTERECTOMY;  Surgeon: Nada Libman, MD;  Location: [facility];  Service: Vascular;  Laterality: N/A;   ENDARTERECTOMY FEMORAL Bilateral 10/03/2020   Procedure: BILATERAL FEMORAL ENDARTERECTOMY;  Surgeon: Nada Libman, MD;  Location: [facility];  Service: Vascular;  Laterality: Bilateral;   HERNIA REPAIR     with mesh   INCISION AND DRAINAGE Left 01/02/2021   Procedure: INCISION AND DRAINAGE LEFT GROIN WITH STIMULAN BEADS;  Surgeon: Nada Libman, MD;  Location: [facility];  Service: Vascular;  Laterality: Left;   INCISION AND DRAINAGE OF WOUND Left 10/17/2020   Procedure: EXPLORATION LEFT GROIN WOUND;  Surgeon: Nada Libman, MD;  Location: [facility];  Service: Vascular;  Laterality: Left;   MAXIMUM ACCESS (MAS)POSTERIOR LUMBAR  INTERBODY FUSION (PLIF) 1 LEVEL N/A 10/25/2014   Procedure: LUMBAR FOUR TO FIVE MAXIMUM ACCESS (MAS) POSTERIOR LUMBAR INTERBODY FUSION (PLIF) 1 LEVEL;  Surgeon: Tia Alert, MD;  Location: [facility];  Service: Neurosurgery;  Laterality: N/A;   PACEMAKER IMPLANT N/A 10/06/2023   Procedure: PACEMAKER IMPLANT;  Surgeon: Regan Lemming, MD;  Location: [facility]AS[facility];  Service: Cardiovascular;  Laterality: N/A;   RIGHT/LEFT HEART CATH AND CORONARY ANGIOGRAPHY N/A 11/10/2019   Procedure: RIGHT/LEFT HEART CATH AND CORONARY ANGIOGRAPHY;  Surgeon: Swazil[place], [name], MD;  Location: [facility] INVASIVE CV LAB;  Service: Cardiovascular;  Laterality: N/A;   SKIN BIOPSY Left 10/12/2018   shave forehead Hypertrophic actinic kertosis with features of a verruca   TEE WITHOUT CARDIOVERSION N/A 11/24/2019   Procedure: TRANSESOPHAGEAL ECHOCARDIOGRAM (TEE);  Surgeon: Alleen Borne, MD;  Location: [facility] OR;  Service: Open Heart Surgery;  Laterality: N/A;   TONSILLECTOMY  age 27   VASECTOMY     X 2   VASECTOMY REVERSAL     Patient Active Problem List   Diagnosis Date Noted   Symptomatic bradycardia 10/05/2023   Subarachnoid hemorrhage following injury (HCC) 08/26/2023   Skull fracture with cerebral contusion (HCC) 08/26/2023   Temporal bone fracture (HCC) 08/26/2023   Aortic atherosclerosis (HCC) 05/26/2023   Acute non-recurrent pansinusitis 01/04/2023   Perennial allergic rhinitis 07/09/2022   Asthma-COPD overlap syndrome (HCC) 04/21/2022   Recurrent infections 04/21/2022   S/P aortobifemoral bypass surgery 04/03/2021   S/P CABG x 1 11/24/2019   S/P aortic valve replacement with bioprosthetic valve 11/24/2019   PAD (peripheral artery disease) (HCC)    Mild persistent asthma 10/29/2019   RBBB (right bundle branch block with left anterior fascicular block)    Peripheral neuropathy    S/P lumbar spinal fusion 10/25/2014   Recovering alcoholic in remission (HCC) 08/10/2014   Substance induced mood  disorder (HCC) 06/30/2014   Substance-induced sleep disorder (HCC) 06/30/2014   Obsessive compulsive disorder    OSA (obstructive sleep apnea) 01/20/2014   CAD (coronary artery disease)    Mentally disabled 11/25/2012   Hyperlipidemia with target LDL less than 130 11/25/2012   Bipolar disorder (HCC)    Hypertension    Allergic rhinitis due to pollen 08/11/2011    ONSET DATE: 08/28/2023, resume orders 10/13/2023 provided by Dr. Loman Brooklyn, MD  REFERRING DIAG: R55 (ICD-10-CM) - Syncope, unspecified syncope type I60.9 (ICD-10-CM) - Subarachnoid hemorrhage (HCC)  THERAPY DIAG:  Unsteadiness on feet  Other abnormalities of gait and mobility  Muscle weakness (generalized)  Rationale for Evaluation and Treatment: Rehabilitation  SUBJECTIVE:  SUBJECTIVE STATEMENT: Pt reports doing well. Had an ECG yesterday, thinks it went well. States his vision has improved. HEP is going well.   Pt accompanied by:  Wife, Chales Abrahams   PERTINENT HISTORY:  pacemaker placement 09/29/2023, CABG, aortic valve replacement (2021)  PAIN:  Are you having pain? Yes: NPRS scale: 3-4/10 Pain location: L shoulder Pain description: achy Aggravating factors: Movement  Relieving factors: tylenol  PRECAUTIONS: Fall and Other: no bending forward (skull fx), cannot lift more than 10lbs until November 18, 2023   PATIENT GOALS: "To reduce my symptoms."  OBJECTIVE:   DIAGNOSTIC FINDINGS:   CT Head wo Contrast 09/08/2023 IMPRESSION: 1. Expected evolution of multicompartmental intracranial hemorrhage since 08/28/2023, with no residual hyperdense blood. 2. 3 mm thick hypodense right parietal convexity subdural collection is unchanged and may reflect a subdural hygroma or evolving subdural hematoma. 3. Unchanged right  temporal/parietal bone fracture.  CT Lumbar Spine wo Contrast 09/08/2023: IMPRESSION: 1. No acute fracture or traumatic malalignment of the lumbar spine. 2. Postsurgical changes reflecting posterior instrumented fusion and decompression at L5-S1 without residual spinal canal or neural foraminal stenosis. 3. Adjacent segment disease at L3-L4 resulting in at least moderate spinal canal stenosis and moderate bilateral neural foraminal stenosis.  CT of head from 08/27/23   IMPRESSION: 1. No significant interval change in bilateral frontal convexity subdural fluid collection measuring up to 6 mm. 2. Redemonstrated subarachnoid hemorrhage and hemorrhagic contusions along the bilateral anterior frontal lobes, slightly increased on the left measuring up 2.1 cm ( previously 1.9 cm), and unchanged along the right frontal lobe. Compared to prior exam there is interval increase in conspicuity of subarachnoid blood products along the bilateral parietal lobes, likely due to redistribution. No evidence of intraventricular extension. No hydrocephalus. 3. Redemonstrated acute nondisplaced fracture involving the right temporal bone.   VITALS Vitals:   10/21/23 0948  BP: (!) 160/67  Pulse: 61     TODAY'S TREATMENT:          Ther Act  Assessed vitals (see above) and WNL for therapy  Lengthy discussion regarding pt's continued balance deficits being closely related to his cognition. Pt's wife reports pt helped cook dinner yesterday and is starting to do things like fold laundry, help in the garden and unload dishwasher w/no issues. Wife reports pt continues to be most unstable at night when he is fatigued. Encouraged pt and wife to continue using the RW but try to incorporate more dual-tasks w/ADLs at home. Pt verbalized understanding.   NMR  Gait pattern: step through pattern and narrow BOS Distance walked: 345' Assistive device utilized: None Level of assistance: SBA Comments: Ambulated around  gym w/ball tosses to work on visual scanning and motor dual tasking. No instability noted w/motor-dual task. Added in naming foods in alphabetical order w/each ball toss, x230'. Pt able to name foods for first half of alphabet but quickly had difficulty w/sustained attention to task and was unable to continue walking w/ball tosses due to inability to think of next letter in alphabet. No instability noted throughout but pt required max cues to continue walking and throwing ball    PATIENT EDUCATION: Education details: see above  Person educated: Patient and Spouse Education method: Medical illustrator Education comprehension: verbalized understanding and needs further education  HOME EXERCISE PROGRAM:  Access Code: [email or web address] URL: [email or web address] Date: 09/08/2023 Prepared by: Maryruth Eve  Exercises - Corner Balance Feet Together With Eyes Closed  - 1 x daily - 7 x weekly - 3 sets -  30 seconds hold - Pencil Pushups  - 1 x daily - 7 x weekly - 3 sets - 10 reps - Seated Horizontal Saccades  - 1 x daily - 7 x weekly - 3 sets - 10 reps - Seated Vertical Saccades  - 1 x daily - 7 x weekly - 3 sets - 10 reps -[place] eye tracking (1 paragraph)  -King Devick's Saccades 1x a day   Education on SPOT it game and HART chart use, VOR x 1 viewing 3 x 5 rounds Brock string ~3x a day with spouse guiding through   GOALS: Goals reviewed with patient? Yes  SHORT TERM GOALS: Target date: 09/29/2023   Pt will be independent with initial HEP for improved balance, transfers and gait.  Baseline: not established on eval; provided Goal status: MET  2.  Patient will improve RPQ-3 score to </= 2 per item to indicate reduced severity of post-concussive symptoms to progress towards PLOF.    Baseline: 3/4, 3/4 Goal status: NOT MET  3.  5x STS to be assessed and STG/LTG updated  Baseline: 15.65 seconds without UE use (SBA) Goal status: Discontinued STG due to how well  performed   4.  Patient will improve mCTSIB score to 100/120 to indicate improved integration of vestibular, proprioceptive, and visual balance systems during static balance tasks in order to reduce risk for falls.   Baseline: 86/120; 120/120 Goal status: MET   LONG TERM GOALS: Target date: 10/27/2023   Pt will be independent with final HEP for improved balance, transfers and gait.  Baseline: Reports partial compliance  Goal status: IN PROGRESS   2.  Patient will improve RPQ-13 score to </= 2 per item to indicate reduced severity of post-concussive symptoms to progress towards PLOF.   Baseline: greatest deficit reported 4/4, greatest defciti 3/4 on fatige and feeling depressed Goal status: NOT MET BUT PROGRESSED   3.  Patient will improve their 5x Sit to Stand score to less than 13 seconds to demonstrate a decreased risk for falls and improved LE strength.   Baseline: 15.65 seconds without UE use (SBA); 9.2 seconds without UE use (SBA); improved to 9.2 seconds  Goal status: MET  4. Patient will improve mCTSIB score to 120/120 to indicate improved integration of vestibular, proprioceptive, and visual balance systems during static balance tasks in order to reduce risk for falls.   Baseline: 86/120; 120/120  Goal status: MET  LONG TERM GOALS UPDATED FOLLOWING RETURN TO CARE: Target date: 12/07/2023  Pt will be independent with final HEP for improved balance, transfers and gait. Baseline: Reports partial compliance  Goal status: IN PROGRESS   2.  Patient will improve RPQ-13 score to </= 2 per item to indicate reduced severity of post-concussive symptoms to progress towards PLOF.   Baseline: greatest deficit reported 4/4, greatest defciti 3/4 on fatige and feeling depressed Goal status: NOT MET BUT PROGRESSED   3.  SOT to be assessed and LTG updated  Baseline: To be assessed  Goal status: MET  ASSESSMENT:  CLINICAL IMPRESSION: Emphasis of skilled PT session on pt education,  dual-tasking and visual scanning. Pt reports he feels as though his vision is improving but continues to be limited by impaired attention and short term memory. Pt's wife reports pt continues to leave his RW around the house, especially at night. Pt reports he forgets to bring his RW with him frequently. Pt will continue to benefit from skilled PT to improve balance w/dynamic tasks and functional dual-tasking. Continue POC.  OBJECTIVE IMPAIRMENTS: Abnormal gait, decreased activity tolerance, decreased balance, decreased cognition, decreased coordination, decreased endurance, decreased knowledge of condition, decreased knowledge of use of DME, decreased mobility, difficulty walking, decreased strength, decreased safety awareness, dizziness, impaired perceived functional ability, impaired vision/preception, and pain  ACTIVITY LIMITATIONS: carrying, lifting, bending, sleeping, stairs, transfers, hygiene/grooming, locomotion level, and caring for others  PARTICIPATION LIMITATIONS: meal prep, cleaning, laundry, interpersonal relationship, driving, shopping, community activity, occupation, and yard work  PERSONAL FACTORS: Fitness, Past/current experiences, and 3+ comorbidities: Bradycardia, post-concussion w/LOC,   are also affecting patient's functional outcome.   REHAB POTENTIAL: Good  CLINICAL DECISION MAKING: Unstable/unpredictable  EVALUATION COMPLEXITY: High  PLAN:  PT FREQUENCY: 2x/week  PT DURATION: 4 additional weeks since last seen for therapy   PLANNED INTERVENTIONS: Therapeutic exercises, Therapeutic activity, Neuromuscular re-education, Balance training, Gait training, Patient/Family education, Self Care, Joint mobilization, Stair training, Vestibular training, Canalith repositioning, DME instructions, Aquatic Therapy, Dry Needling, Electrical stimulation, Manual therapy, and Re-evaluation  PLAN FOR NEXT SESSION: progress occulomotor/vestibular exercises (VORx1 as tolerated, Brock  string), progress corner balance (EC on foam, tandem with head turns as able etc), consider neuropthomology referral pending progress; did OT/speech get scheduled, HART chart with balance work, work on Building surveyor with balance training or dynamic gait/balance without AD with visual scanning, midline orientation,   Assess SOT and update LTG as appropriate and work on balance with LRAD   Amarion Portell E Huyen Perazzo, PT, DPT  10/21/2023, 10:21 AM

## 2023-10-21 NOTE — Progress Notes (Signed)

## 2023-10-21 NOTE — Patient Instructions (Signed)
[id number]  After Your Pacemaker   Monitor your pacemaker site for redness, swelling, and drainage. Call the device clinic at [phone] if you experience these symptoms or fever/chills.  Your incision was closed with Steri-strips or staples:  You may shower 7 days after your procedure and wash your incision with soap and water. Avoid lotions, ointments, or perfumes over your incision until it is well-healed.  You may use a hot tub or a pool after your wound check appointment if the incision is completely closed.  Do not lift, push or pull greater than 10 pounds with the affected arm until 4 weeks after your procedure. There are no other restrictions in arm movement after your wound check appointment.  You may drive, unless driving has been restricted by your healthcare providers.  Remote monitoring is used to monitor your pacemaker from home. This monitoring is scheduled every 91 days by our office. It allows [place] to keep an eye on the functioning of your device to ensure it is working properly. You will routinely see your Electrophysiologist annually (more often if necessary).

## 2023-10-22 ENCOUNTER — Ambulatory Visit (HOSPITAL_COMMUNITY)
Admission: RE | Admit: 2023-10-22 | Discharge: 2023-10-22 | Disposition: A | Discharge status: Home / Self Care | Payer: PPO | Source: Ambulatory Visit | Attending: Neurological Surgery | Admitting: Neurological Surgery

## 2023-10-22 ENCOUNTER — Ambulatory Visit: Payer: PPO | Admitting: Physical Therapy

## 2023-10-22 ENCOUNTER — Encounter: Payer: Self-pay | Admitting: Physical Therapy

## 2023-10-22 ENCOUNTER — Encounter: Payer: PPO | Admitting: Occupational Therapy

## 2023-10-22 ENCOUNTER — Telehealth: Payer: Self-pay

## 2023-10-22 ENCOUNTER — Ambulatory Visit: Payer: PPO | Admitting: Occupational Therapy

## 2023-10-22 VITALS — BP 160/82 | HR 60

## 2023-10-22 DIAGNOSIS — R2689 Other abnormalities of gait and mobility: Secondary | ICD-10-CM

## 2023-10-22 DIAGNOSIS — I69018 Other symptoms and signs involving cognitive functions following nontraumatic subarachnoid hemorrhage: Secondary | ICD-10-CM

## 2023-10-22 DIAGNOSIS — S069X1A Unspecified intracranial injury with loss of consciousness of 30 minutes or less, initial encounter: Secondary | ICD-10-CM | POA: Diagnosis not present

## 2023-10-22 DIAGNOSIS — R2681 Unsteadiness on feet: Secondary | ICD-10-CM

## 2023-10-22 DIAGNOSIS — R41842 Visuospatial deficit: Secondary | ICD-10-CM

## 2023-10-22 DIAGNOSIS — M6281 Muscle weakness (generalized): Secondary | ICD-10-CM

## 2023-10-22 DIAGNOSIS — S069X9A Unspecified intracranial injury with loss of consciousness of unspecified duration, initial encounter: Secondary | ICD-10-CM | POA: Diagnosis not present

## 2023-10-22 NOTE — Telephone Encounter (Signed)
-----   Message from Jonita Albee sent at 10/21/2023  3:21 PM EDT ----- Please tell patient that his echocardiogram showed EF (pumping function of the heart) normal at 60-65%. LV is functioning normally, and all walls are moving properly. Heart muscle is a little stiff, which can occur with age. RV functioning normally.   His bioprosthetic aortic valve is not leaking and is not thickening/obstructive blood flow. Overall good result! No medication changes at this time   Thanks! KJ

## 2023-10-22 NOTE — Telephone Encounter (Signed)
Called patient advised of below they verbalized understanding.

## 2023-10-22 NOTE — Therapy (Signed)
OUTPATIENT PHYSICAL THERAPY NEURO TREATMENT   Patient Name: Gregory [name] MRN: [id number] DOB:12-13-1951, 72 y.o., male Today's Date: 10/22/2023   PCP: Ronnald Nian, MD REFERRING PROVIDER: Willeen Niece, MD  END OF SESSION:  PT End of Session - 10/22/23 0852     Visit Number 12    Number of Visits 18    Date for PT Re-Evaluation 12/07/23    Authorization Type [employer]    PT Start Time 0850    PT Stop Time 0930    PT Time Calculation (min) 40 min    Equipment Utilized During Treatment Gait belt    Activity Tolerance Patient tolerated treatment well    Behavior During Therapy WFL for tasks assessed/performed            Past Medical History:  Diagnosis Date   Alcohol abuse    Aortic stenosis    Arthritis    Asthma    exercise indused or pollen-uses inhaler dail yand has rescue inhaler if needed   Bipolar disorder (HCC)    CAD (coronary artery disease)    coronary calcifications 08/2013 CTA   Carotid artery occlusion    Cataract    bilateral sx   Chronic back pain    Claudication (HCC)    Complication of anesthesia 10/16/2020   "had a panic attack when the mask was put on me"" I do not think I was given anything  before, I need something to calm me down"   Depression    Family history of skin cancer    Heart murmur    as an infant   Hepatitis    h/o 1970,doesn't remember which type,1990 epstain-barr   Herpes zoster without complication 09/05/2019   Hyperlipidemia    Hypertension    Neuromuscular disorder (HCC)    Obsessive compulsive disorder    PAD (peripheral artery disease) (HCC)    Peripheral neuropathy    Pneumonia    RBBB    RBBB (right bundle branch block with left anterior fascicular block)    Seasonal allergies    Severe aortic stenosis    Sleep apnea    does not wear CPAP   Smoker    Spinal stenosis at L4-L5 level    Tobacco abuse    Tobacco use disorder    Past Surgical History:  Procedure Laterality Date   ANAL  FISTULECTOMY  09/25/2011   AORTA - BILATERAL FEMORAL ARTERY BYPASS GRAFT N/A 10/03/2020   Procedure: AORTA BIFEMORAL BYPASS GRAFT USING A HEMASHIELD GOLD BIFURCATED 14 X 7mm GRAFT AND INFERIOR MESENTERIC ARTERY REIMPLANTATION;  Surgeon: Nada Libman, MD;  Location: [facility] OR;  Service: Vascular;  Laterality: N/A;   AORTIC VALVE REPLACEMENT N/A 11/24/2019   Procedure: AORTIC VALVE REPLACEMENT (AVR) using INSPIRIS Resilia 23 MM Bioprosthetic Aortic Valve.;  Surgeon: Alleen Borne, MD;  Location: [facility] OR;  Service: Open Heart Surgery;  Laterality: N/A;   APPLICATION OF WOUND VAC Left 10/17/2020   Procedure: APPLICATION OF WOUND VAC;  Surgeon: Nada Libman, MD;  Location: [facility] OR;  Service: Vascular;  Laterality: Left;   BACK SURGERY     2015   CATARACT EXTRACTION, BILATERAL  2022   COLONOSCOPY  2009   MS-F/V-mov(exc)-HPP   CORONARY ARTERY BYPASS GRAFT N/A 11/24/2019   Procedure: CORONARY ARTERY BYPASS GRAFTING (CABG) using LIMA to LAD.;  Surgeon: Alleen Borne, MD;  Location: [facility] OR;  Service: Open Heart Surgery;  Laterality: N/A;   ELBOW SURGERY     bilaterally  for cubital tunnel   ENDARTERECTOMY N/A 10/03/2020   Procedure: AORTIC ENDARTERECTOMY;  Surgeon: Nada Libman, MD;  Location: [facility] OR;  Service: Vascular;  Laterality: N/A;   ENDARTERECTOMY FEMORAL Bilateral 10/03/2020   Procedure: BILATERAL FEMORAL ENDARTERECTOMY;  Surgeon: Nada Libman, MD;  Location: [facility] OR;  Service: Vascular;  Laterality: Bilateral;   HERNIA REPAIR     with mesh   INCISION AND DRAINAGE Left 01/02/2021   Procedure: INCISION AND DRAINAGE LEFT GROIN WITH STIMULAN BEADS;  Surgeon: Nada Libman, MD;  Location: [facility] OR;  Service: Vascular;  Laterality: Left;   INCISION AND DRAINAGE OF WOUND Left 10/17/2020   Procedure: EXPLORATION LEFT GROIN WOUND;  Surgeon: Nada Libman, MD;  Location: [facility] OR;  Service: Vascular;  Laterality: Left;   MAXIMUM ACCESS (MAS)POSTERIOR LUMBAR INTERBODY FUSION (PLIF) 1 LEVEL N/A  10/25/2014   Procedure: LUMBAR FOUR TO FIVE MAXIMUM ACCESS (MAS) POSTERIOR LUMBAR INTERBODY FUSION (PLIF) 1 LEVEL;  Surgeon: Tia Alert, MD;  Location: [facility] NEURO [facility];  Service: Neurosurgery;  Laterality: N/A;   PACEMAKER IMPLANT N/A 10/06/2023   Procedure: PACEMAKER IMPLANT;  Surgeon: Regan Lemming, MD;  Location: [facility] INVASIVE CV LAB;  Service: Cardiovascular;  Laterality: N/A;   RIGHT/LEFT HEART CATH AND CORONARY ANGIOGRAPHY N/A 11/10/2019   Procedure: RIGHT/LEFT HEART CATH AND CORONARY ANGIOGRAPHY;  Surgeon: Swaziland, [name], MD;  Location: [facility] INVASIVE CV LAB;  Service: Cardiovascular;  Laterality: N/A;   SKIN BIOPSY Left 10/12/2018   shave forehead Hypertrophic actinic kertosis with features of a verruca   TEE WITHOUT CARDIOVERSION N/A 11/24/2019   Procedure: TRANSESOPHAGEAL ECHOCARDIOGRAM (TEE);  Surgeon: Alleen Borne, MD;  Location: [facility] OR;  Service: Open Heart Surgery;  Laterality: N/A;   TONSILLECTOMY  age 38   VASECTOMY     X 2   VASECTOMY REVERSAL     Patient Active Problem List   Diagnosis Date Noted   Symptomatic bradycardia 10/05/2023   Subarachnoid hemorrhage following injury (HCC) 08/26/2023   Skull fracture with cerebral contusion (HCC) 08/26/2023   Temporal bone fracture (HCC) 08/26/2023   Aortic atherosclerosis (HCC) 05/26/2023   Acute non-recurrent pansinusitis 01/04/2023   Perennial allergic rhinitis 07/09/2022   Asthma-COPD overlap syndrome (HCC) 04/21/2022   Recurrent infections 04/21/2022   S/P aortobifemoral bypass surgery 04/03/2021   S/P CABG x 1 11/24/2019   S/P aortic valve replacement with bioprosthetic valve 11/24/2019   PAD (peripheral artery disease) (HCC)    Mild persistent asthma 10/29/2019   RBBB (right bundle branch block with left anterior fascicular block)    Peripheral neuropathy    S/P lumbar spinal fusion 10/25/2014   Recovering alcoholic in remission (HCC) 08/10/2014   Substance induced mood disorder (HCC) 06/30/2014    Substance-induced sleep disorder (HCC) 06/30/2014   Obsessive compulsive disorder    OSA (obstructive sleep apnea) 01/20/2014   CAD (coronary artery disease)    Mentally disabled 11/25/2012   Hyperlipidemia with target LDL less than 130 11/25/2012   Bipolar disorder (HCC)    Hypertension    Allergic rhinitis due to pollen 08/11/2011    ONSET DATE: 08/28/2023, resume orders 10/13/2023 provided by Dr. Loman Brooklyn, MD  REFERRING DIAG: R55 (ICD-10-CM) - Syncope, unspecified syncope type I60.9 (ICD-10-CM) - Subarachnoid hemorrhage (HCC)  THERAPY DIAG:  Unsteadiness on feet  Other abnormalities of gait and mobility  Muscle weakness (generalized)  Rationale for Evaluation and Treatment: Rehabilitation  SUBJECTIVE:  SUBJECTIVE STATEMENT: Pt reports doing well. Had an ECG yesterday, thinks it went well. States his vision has improved. HEP is going well.   Pt accompanied by:  Wife, Chales Abrahams   PERTINENT HISTORY:  pacemaker placement 09/29/2023, CABG, aortic valve replacement (2021)  PAIN:  Are you having pain? Yes: NPRS scale: 3-4/10 Pain location: L shoulder Pain description: achy Aggravating factors: Movement  Relieving factors: tylenol  PRECAUTIONS: Fall and Other: no bending forward (skull fx), cannot lift more than 10lbs until November 18, 2023   PATIENT GOALS: "To reduce my symptoms."  OBJECTIVE:   DIAGNOSTIC FINDINGS:   CT Head wo Contrast 09/08/2023 IMPRESSION: 1. Expected evolution of multicompartmental intracranial hemorrhage since 08/28/2023, with no residual hyperdense blood. 2. 3 mm thick hypodense right parietal convexity subdural collection is unchanged and may reflect a subdural hygroma or evolving subdural hematoma. 3. Unchanged right temporal/parietal bone fracture.  CT  Lumbar Spine wo Contrast 09/08/2023: IMPRESSION: 1. No acute fracture or traumatic malalignment of the lumbar spine. 2. Postsurgical changes reflecting posterior instrumented fusion and decompression at L5-S1 without residual spinal canal or neural foraminal stenosis. 3. Adjacent segment disease at L3-L4 resulting in at least moderate spinal canal stenosis and moderate bilateral neural foraminal stenosis.  CT of head from 08/27/23   IMPRESSION: 1. No significant interval change in bilateral frontal convexity subdural fluid collection measuring up to 6 mm. 2. Redemonstrated subarachnoid hemorrhage and hemorrhagic contusions along the bilateral anterior frontal lobes, slightly increased on the left measuring up 2.1 cm ( previously 1.9 cm), and unchanged along the right frontal lobe. Compared to prior exam there is interval increase in conspicuity of subarachnoid blood products along the bilateral parietal lobes, likely due to redistribution. No evidence of intraventricular extension. No hydrocephalus. 3. Redemonstrated acute nondisplaced fracture involving the right temporal bone.   VITALS Vitals:   10/22/23 0854  BP: (!) 160/82  Pulse: 60   Seated on R arm; patient just took BP medications  HR: 88-60 bpm, SPO2: 99%  TODAY'S TREATMENT:           Ther Act  Assessed vitals (see above) and within limits for therapy Discussed trialing ambulating without an AD at home with spouse and use of gait belt and CGA. Discussed extensively that not to be trialed with patient alone at any time. Discussed safety with curbs having caregiver on the side closer to curb and scanning for visual safety. Discussed transfers with need to reach back to chair due to visual deficits.  Curb navigation up and down outdoors x 6 reps with SBA-CGA (no LOB and appropriate safety)   NMR  Gait pattern: step through pattern and narrow BOS Distance walked: 1500' outdoors, required 1x seated rest break halfway  around the clinic outdoors due to mild fatigue  Assistive device utilized: None Level of assistance: SBA and CGA Comments: ambulated with supervision and cues for visual scanning outdoors for safety, ambulated on sidewalk, up and down curbs, through grass   Obstacle course with 3 x with transfers from three chairs an step over 6" hurdles (CGA) 1 x 10 feet     PATIENT EDUCATION: Education details: Continue HEP + see above Person educated: Patient and Spouse Education method: Medical illustrator Education comprehension: verbalized understanding and needs further education  HOME EXERCISE PROGRAM:  Access Code: [email or web address] URL: [email or web address] Date: 09/08/2023 Prepared by: Maryruth Eve  Exercises - Corner Balance Feet Together With Eyes Closed  - 1 x daily - 7 x weekly - 3 sets -  30 seconds hold - Pencil Pushups  - 1 x daily - 7 x weekly - 3 sets - 10 reps - Seated Horizontal Saccades  - 1 x daily - 7 x weekly - 3 sets - 10 reps - Seated Vertical Saccades  - 1 x daily - 7 x weekly - 3 sets - 10 reps -[place] eye tracking (1 paragraph)  -King Devick's Saccades 1x a day   Education on SPOT it game and HART chart use, VOR x 1 viewing 3 x 5 rounds Brock string ~3x a day with spouse guiding through   GOALS: Goals reviewed with patient? Yes  SHORT TERM GOALS: Target date: 09/29/2023   Pt will be independent with initial HEP for improved balance, transfers and gait.  Baseline: not established on eval; provided Goal status: MET  2.  Patient will improve RPQ-3 score to </= 2 per item to indicate reduced severity of post-concussive symptoms to progress towards PLOF.    Baseline: 3/4, 3/4 Goal status: NOT MET  3.  5x STS to be assessed and STG/LTG updated  Baseline: 15.65 seconds without UE use (SBA) Goal status: Discontinued STG due to how well performed   4.  Patient will improve mCTSIB score to 100/120 to indicate improved integration of vestibular,  proprioceptive, and visual balance systems during static balance tasks in order to reduce risk for falls.   Baseline: 86/120; 120/120 Goal status: MET   LONG TERM GOALS: Target date: 10/27/2023   Pt will be independent with final HEP for improved balance, transfers and gait.  Baseline: Reports partial compliance  Goal status: IN PROGRESS   2.  Patient will improve RPQ-13 score to </= 2 per item to indicate reduced severity of post-concussive symptoms to progress towards PLOF.   Baseline: greatest deficit reported 4/4, greatest defciti 3/4 on fatige and feeling depressed Goal status: NOT MET BUT PROGRESSED   3.  Patient will improve their 5x Sit to Stand score to less than 13 seconds to demonstrate a decreased risk for falls and improved LE strength.   Baseline: 15.65 seconds without UE use (SBA); 9.2 seconds without UE use (SBA); improved to 9.2 seconds  Goal status: MET  4. Patient will improve mCTSIB score to 120/120 to indicate improved integration of vestibular, proprioceptive, and visual balance systems during static balance tasks in order to reduce risk for falls.   Baseline: 86/120; 120/120  Goal status: MET  LONG TERM GOALS UPDATED FOLLOWING RETURN TO CARE: Target date: 12/07/2023  Pt will be independent with final HEP for improved balance, transfers and gait. Baseline: Reports partial compliance  Goal status: IN PROGRESS   2.  Patient will improve RPQ-13 score to </= 2 per item to indicate reduced severity of post-concussive symptoms to progress towards PLOF.   Baseline: greatest deficit reported 4/4, greatest defciti 3/4 on fatige and feeling depressed Goal status: NOT MET BUT PROGRESSED   3.  SOT to be assessed and LTG updated  Baseline: To be assessed  Goal status: Discontinued at this time due to pacemaker incision placement and harness that needs to be donned with testing   ASSESSMENT:  CLINICAL IMPRESSION: Emphasis of skilled PT session on ambulation in complex  outdoor environment without an AD with CGA progressing to SBA. Patient with no LOB and only cues for safety and pacing. Recommend trial at home with caregiver and gait belt; extensive education for use otherwise with 2WW given cognitive deficits. Also reviewed safety with transfers and curbs. Educated on how to reach  back to line up to ensure not missing chair. Continue POC.   OBJECTIVE IMPAIRMENTS: Abnormal gait, decreased activity tolerance, decreased balance, decreased cognition, decreased coordination, decreased endurance, decreased knowledge of condition, decreased knowledge of use of DME, decreased mobility, difficulty walking, decreased strength, decreased safety awareness, dizziness, impaired perceived functional ability, impaired vision/preception, and pain  ACTIVITY LIMITATIONS: carrying, lifting, bending, sleeping, stairs, transfers, hygiene/grooming, locomotion level, and caring for others  PARTICIPATION LIMITATIONS: meal prep, cleaning, laundry, interpersonal relationship, driving, shopping, community activity, occupation, and yard work  PERSONAL FACTORS: Fitness, Past/current experiences, and 3+ comorbidities: Bradycardia, post-concussion w/LOC,   are also affecting patient's functional outcome.   REHAB POTENTIAL: Good  CLINICAL DECISION MAKING: Unstable/unpredictable  EVALUATION COMPLEXITY: High  PLAN:  PT FREQUENCY: 2x/week  PT DURATION: 4 additional weeks since last seen for therapy   PLANNED INTERVENTIONS: Therapeutic exercises, Therapeutic activity, Neuromuscular re-education, Balance training, Gait training, Patient/Family education, Self Care, Joint mobilization, Stair training, Vestibular training, Canalith repositioning, DME instructions, Aquatic Therapy, Dry Needling, Electrical stimulation, Manual therapy, and Re-evaluation  PLAN FOR NEXT SESSION: progress occulomotor/vestibular exercises (VORx1 as tolerated, Brock string), progress corner balance (EC on foam, tandem  with head turns as able etc), consider neuropthomology referral pending progress; did OT/speech get scheduled, HART chart with balance work, work on Patent attorney scanning with balance training or dynamic gait/balance without AD with visual scanning, midline orientation,   Continue to work on gait without AD and progress higher level balance tasks as able    Carmelia Bake, PT, DPT  10/22/2023, 12:22 PM

## 2023-10-22 NOTE — Therapy (Signed)
OUTPATIENT OCCUPATIONAL THERAPY TREATMENT  Patient Name: Gregory [name] MRN: [id number] DOB:27-Aug-1951, 72 y.o., male Today's Date: 10/22/2023  PCP: Ronnald Nian, MD REFERRING PROVIDER: Ronnald Nian, MD  END OF SESSION:  OT End of Session - 10/22/23 1128     Visit Number 4    Number of Visits 13    Date for OT Re-Evaluation 11/13/23    Authorization Type [employer]    Progress Note Due on Visit 10    OT Start Time 0930    OT Stop Time 1013    OT Time Calculation (min) 43 min    Equipment Utilized During Treatment gait belt    Activity Tolerance Patient tolerated treatment well    Behavior During Therapy WFL for tasks assessed/performed               Past Medical History:  Diagnosis Date   Alcohol abuse    Aortic stenosis    Arthritis    Asthma    exercise indused or pollen-uses inhaler dail yand has rescue inhaler if needed   Bipolar disorder (HCC)    CAD (coronary artery disease)    coronary calcifications 08/2013 CTA   Carotid artery occlusion    Cataract    bilateral sx   Chronic back pain    Claudication (HCC)    Complication of anesthesia 10/16/2020   "had a panic attack when the mask was put on me"" I do not think I was given anything  before, I need something to calm me down"   Depression    Family history of skin cancer    Heart murmur    as an infant   Hepatitis    h/o 1970,doesn't remember which type,1990 epstain-barr   Herpes zoster without complication 09/05/2019   Hyperlipidemia    Hypertension    Neuromuscular disorder (HCC)    Obsessive compulsive disorder    PAD (peripheral artery disease) (HCC)    Peripheral neuropathy    Pneumonia    RBBB    RBBB (right bundle branch block with left anterior fascicular block)    Seasonal allergies    Severe aortic stenosis    Sleep apnea    does not wear CPAP   Smoker    Spinal stenosis at L4-L5 level    Tobacco abuse    Tobacco use disorder    Past Surgical History:  Procedure  Laterality Date   ANAL FISTULECTOMY  09/25/2011   AORTA - BILATERAL FEMORAL ARTERY BYPASS GRAFT N/A 10/03/2020   Procedure: AORTA BIFEMORAL BYPASS GRAFT USING A HEMASHIELD GOLD BIFURCATED 14 X 7mm GRAFT AND INFERIOR MESENTERIC ARTERY REIMPLANTATION;  Surgeon: Nada Libman, MD;  Location: [facility] OR;  Service: Vascular;  Laterality: N/A;   AORTIC VALVE REPLACEMENT N/A 11/24/2019   Procedure: AORTIC VALVE REPLACEMENT (AVR) using INSPIRIS Resilia 23 MM Bioprosthetic Aortic Valve.;  Surgeon: Alleen Borne, MD;  Location: [facility] OR;  Service: Open Heart Surgery;  Laterality: N/A;   APPLICATION OF WOUND VAC Left 10/17/2020   Procedure: APPLICATION OF WOUND VAC;  Surgeon: Nada Libman, MD;  Location: [facility];  Service: Vascular;  Laterality: Left;   BACK SURGERY     2015   CATARACT EXTRACTION, BILATERAL  2022   COLONOSCOPY  2009   MS-F/V-mov(exc)-HPP   CORONARY ARTERY BYPASS GRAFT N/A 11/24/2019   Procedure: CORONARY ARTERY BYPASS GRAFTING (CABG) using LIMA to LAD.;  Surgeon: Alleen Borne, MD;  Location: [facility];  Service: Open Heart Surgery;  Laterality:  N/A;   ELBOW SURGERY     bilaterally for cubital tunnel   ENDARTERECTOMY N/A 10/03/2020   Procedure: AORTIC ENDARTERECTOMY;  Surgeon: Nada Libman, MD;  Location: [facility];  Service: Vascular;  Laterality: N/A;   ENDARTERECTOMY FEMORAL Bilateral 10/03/2020   Procedure: BILATERAL FEMORAL ENDARTERECTOMY;  Surgeon: Nada Libman, MD;  Location: [facility];  Service: Vascular;  Laterality: Bilateral;   HERNIA REPAIR     with mesh   INCISION AND DRAINAGE Left 01/02/2021   Procedure: INCISION AND DRAINAGE LEFT GROIN WITH STIMULAN BEADS;  Surgeon: Nada Libman, MD;  Location: [facility];  Service: Vascular;  Laterality: Left;   INCISION AND DRAINAGE OF WOUND Left 10/17/2020   Procedure: EXPLORATION LEFT GROIN WOUND;  Surgeon: Nada Libman, MD;  Location: [facility];  Service: Vascular;  Laterality: Left;   MAXIMUM ACCESS (MAS)POSTERIOR LUMBAR INTERBODY  FUSION (PLIF) 1 LEVEL N/A 10/25/2014   Procedure: LUMBAR FOUR TO FIVE MAXIMUM ACCESS (MAS) POSTERIOR LUMBAR INTERBODY FUSION (PLIF) 1 LEVEL;  Surgeon: Tia Alert, MD;  Location: [facility];  Service: Neurosurgery;  Laterality: N/A;   PACEMAKER IMPLANT N/A 10/06/2023   Procedure: PACEMAKER IMPLANT;  Surgeon: Regan Lemming, MD;  [facility]: [facility] INVASIVE CV LAB;  Service: Cardiovascular;  Laterality: N/A;   RIGHT/LEFT HEART CATH AND CORONARY ANGIOGRAPHY N/A 11/10/2019   Procedure: RIGHT/LEFT HEART CATH AND CORONARY ANGIOGRAPHY;  Surgeon: Swaziland, [name], MD;  Location: [facility] INVASIVE CV LAB;  Service: Cardiovascular;  Laterality: N/A;   SKIN BIOPSY Left 10/12/2018   shave forehead Hypertrophic actinic kertosis with features of a verruca   TEE WITHOUT CARDIOVERSION N/A 11/24/2019   Procedure: TRANSESOPHAGEAL ECHOCARDIOGRAM (TEE);  Surgeon: Alleen Borne, MD;  Location: [facility] OR;  Service: Open Heart Surgery;  Laterality: N/A;   TONSILLECTOMY  age 89   VASECTOMY     X 2   VASECTOMY REVERSAL     Patient Active Problem List   Diagnosis Date Noted   Symptomatic bradycardia 10/05/2023   Subarachnoid hemorrhage following injury (HCC) 08/26/2023   Skull fracture with cerebral contusion (HCC) 08/26/2023   Temporal bone fracture (HCC) 08/26/2023   Aortic atherosclerosis (HCC) 05/26/2023   Acute non-recurrent pansinusitis 01/04/2023   Perennial allergic rhinitis 07/09/2022   Asthma-COPD overlap syndrome (HCC) 04/21/2022   Recurrent infections 04/21/2022   S/P aortobifemoral bypass surgery 04/03/2021   S/P CABG x 1 11/24/2019   S/P aortic valve replacement with bioprosthetic valve 11/24/2019   PAD (peripheral artery disease) (HCC)    Mild persistent asthma 10/29/2019   RBBB (right bundle branch block with left anterior fascicular block)    Peripheral neuropathy    S/P lumbar spinal fusion 10/25/2014   Recovering alcoholic in remission (HCC) 08/10/2014   Substance induced mood disorder (HCC)  06/30/2014   Substance-induced sleep disorder (HCC) 06/30/2014   Obsessive compulsive disorder    OSA (obstructive sleep apnea) 01/20/2014   CAD (coronary artery disease)    Mentally disabled 11/25/2012   Hyperlipidemia with target LDL less than 130 11/25/2012   Bipolar disorder (HCC)    Hypertension    Allergic rhinitis due to pollen 08/11/2011    ONSET DATE: 09/15/2023 (referral date)   REFERRING DIAG: S06.9X1D (ICD-10-CM) - Traumatic brain injury, with loss of consciousness of 30 minutes or less, subsequent encounter R41.3 (ICD-10-CM) - Memory deficit  THERAPY DIAG:  Unsteadiness on feet  Muscle weakness (generalized)  Visuospatial deficit  Other symptoms and signs involving cognitive functions following nontraumatic subarachnoid hemorrhage  Rationale for  Evaluation and Treatment: Rehabilitation  SUBJECTIVE:   SUBJECTIVE STATEMENT: Pt reports things are going better, including remembering walker in all rooms by keeping walker in line of sight. Pt reports difficulty navigating walker in kitchen and instead uses 2 countertops on either side for support instead (counters both within reach, approx. 3 feet apart). Pt reports continuing to follow pacemaker precautions and getting CT scan later today.  Pt accompanied by:  Spouse  PERTINENT HISTORY: presenting after syncopal event and fall on 08/25/23. Imaging showed acute subarachnoid hemorrhage with 1.8 cm inferior left frontal lobe hemorrhagic contusion without substantial mass effect and nondisplaced fracture of the right temporoparietal calvarium. PMH: CABG, aortic valve replacement, HTN, HLD, bipolar, bradycardia  Update - Pacemaker implant on 10/06/23  PRECAUTIONS: Other: no driving, fall risk, syncope, pacemaker implant 10/06/23  10/16/23 update - Pt and pt's spouse reported new precautions d/t pacemaker implant s/p 10/06/23: LUE - no lift/pull/push > 8-10 lbs.   10/22/23 update - Pt reports lifting limit precautions will  be stopped on 11/18/23. Pt reports he is in process for return to work and has been approved for limited exercise.  WEIGHT BEARING RESTRICTIONS: No  PAIN:  Are you having pain? Pt reports discomfort at site of pacemaker (1/10 pain).  FALLS: Has patient fallen in last 6 months? Yes. 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  HAND DOMINANCE: Left  ADLs: Eating: assist to cut food occasionally Grooming: independent/ UB Dressing: independ ent LB Dressing: independent Toileting: independent (raised) Bathing: mod I - standing some, seated some Tub Shower transfers: mod I  Equipment: Shower seat with back  IADLs: Shopping: pt goes with wife (pt used to do) Light housekeeping: wife always does majority, but pt has not returned to full laundry tasks Meal Prep: wife doing now for safety reasons, but both pt and wife took turns prior to injury Community mobility: pt currently not driving since injury Medication management: independent Financial management: wife has always done  Handwriting:  pt reports always been horrible - no changes.   MOBILITY STATUS:  uses walker   ACTIVITY TOLERANCE: Activity tolerance: fatigues quicker   UPPER EXTREMITY ROM:  BUE AROM WFL's - pt has flexed ring and small fingers Lt hand from old injury but can fully open for short amounts of time   UPPER EXTREMITY MMT:   RUE 5/5 grossly, LUE 4+/5 grossly at shoulders   HAND FUNCTION: Grip strength: Right: 57.5 lbs; Left: 61.9 lbs  COORDINATION: 9 Hole Peg test: Right: 36.06 sec; Left: 35.92 sec (pt denies change and reports  coordination bilaterally has been slightly slower for some time)   SENSATION: WFL Per pt report  EDEMA: none   COGNITION: Overall cognitive status:  difficult to assess, ? Safety awareness and executive functioning - see speech evaluation for details  VISION: Subjective report: not sure about visual changes, denies diplopia but wife reports issues with saccades and P.T. gave ex's for this and convergence Baseline vision: Wears glasses for reading only Visual history: corrective eye surgery for cataracts  VISION ASSESSMENT: TBA - d/t time constraints during evaluation  10/02/23 update - Pt accurately read clock on wall. Pt wears  reading glasses, pt reports hx of cataract surgery, and pt reports need to update prescription d/t difficulty seeing objects far away. Pt and pt's spouse reported changes with vision throughout the 3 weeks after the fall event, such as paint chips on wall appearing as "floating" and moving through pt's vision. However, pt reports "floaters" have mostly resolved and has not noticed floaters recently.  Ocular movements when tracking - Mild atypical movements of both eyes while tracking a marker in all directions and more significant "jumping" of eye movements intermittently. Pt's spouse reports eye movements "saccades" are greatly improved compared to immediately after the fall.  Peripheral vision approx. 50* on both R and L. Superior vision WFL.  Letter cancellation - Pt correctly identified 100% of letters in 4 out of 4 lines for identifying letters uppercase B, D, E, and F.    PERCEPTION: Not tested  PRAXIS: Not tested  OBSERVATIONS: very verbose, occasional redirection needed, ? Safety awareness.    TODAY'S TREATMENT:                                                                                                                               Therapeutic activities OT assessed pt's progress towards goals (see below).  Visual scanning in enviornment -  to improve safety, increase environmental scanning - Pt walked around clinic with gait belt and walker, supervision assist - Pt located x12 cards. Pt missed x3 cards on L side and therefore benefited from mod v/c to look to left. Pt easily tolerated standing/walking for 14 minutes.  Self-care Folding laundry while standing (gait belt, supervision assist) - to improve dynamic standing balance for functional tasks, cognition for multi-tasking, increase ind during IADL tasks - Pt demo'd RUE tremors and difficulty with buttons, therefore OT educated pt on button hook. Pt demo'd understanding by trialing use of button hook with shirt stabilized on flat surface. Pt tolerated standing for 10 minutes during task of folding and hanging shirts, jackets, shorts.   PATIENT EDUCATION: Education details: review of vision HEP, wrapping tip of pen in another color/tape to improve tracking during vision HEP, walker safety - keeping walker on floor and avoiding carrying walker, button hook Person educated: Patient and Spouse Education method: Explanation, Demonstration, and Verbal cues Education comprehension: verbalized understanding, returned demonstration, verbal cues required, and needs further education  HOME EXERCISE PROGRAM: Vision HEP (see pt instructions) 10/22/23 - Locate colored objects in environment with visual scanning (e.g. find all the green objects)   GOALS: Goals reviewed with patient? Yes  SHORT TERM GOALS: Target date: 10/21/23  Pt to be independent with HEP for overall strength/endurance UEs Baseline: Goal status: 10/22/23 - MET 10/22/23 - Pt reports completing HEP vision exercises a minimum of 3 times per day and increasing reps as tolerated. Pt demo'd understanding of exercises, x1 v/c to track marker tip fully to left side.  2.  Pt to return to standing for minimum of 10 minutes to perform  light IADL's (folding clothes, washing dishes)  Baseline:  10/22/23 - Pt tolerated  standing/walking for 8 to 13 minutes today without difficulty to complete functional tasks. Goal status: 10/22/23 - MET  3.  Pt to verbalize understanding with safety considerations for fall prevention during IADLS including appropriate use of walker for kitchen negotiation Baseline:  10/22/23 - Pt verbalized understanding of walker safety and using alternate stable surfaces (countertops) in kitchen for kitchen navigation. Pt and pt's spouse reported pt is demo'ing more consistent use of walker. Goal status:  10/22/23 - MET    LONG TERM GOALS: Target date: 11/13/23  Pt to perform environmental scanning in moderately distracting gym with 85% accuracy or greater in prep for potential return to driving Baseline:  Goal status: INITIAL  2.  Pt to demo safety w/ simple cooking task with direct supervision but no physical assist Baseline:  Goal status: INITIAL  3.  10/02/23 Update following additional vision assessment -  Patient will demonstrate at least 85% accuracy with environmental visual scanning to assist with ADLs and IADLS including potential driving considerations.   Baseline:  Goal status: INITIAL   ASSESSMENT:  CLINICAL IMPRESSION: Pt's progress towards STG assessed today with pt meeting 3 out of 3 STG. Pt making great progress towards goals. Pt continues to demo great standing and walking tolerance during functional IADL laundry tasks and environmental scanning task. Pt continues to benefit from v/c to scan to L side and to keep walker on ground at all times when turning during ambulation.  Pt would benefit from skilled OT services in the outpatient setting to work on impairments as noted below to help pt return to PLOF as able.     PERFORMANCE DEFICITS: in functional skills including IADLs, strength, Fine motor control, mobility, balance, endurance, cardiopulmonary status limiting function, decreased knowledge of precautions, decreased knowledge of use of DME, vision, and UE  functional use, cognitive skills including problem solving and safety awareness, and psychosocial skills including coping strategies.   IMPAIRMENTS: are limiting patient from IADLs, work, and leisure.   CO-MORBIDITIES: may have co-morbidities  that affects occupational performance. Patient will benefit from skilled OT to address above impairments and improve overall function.  MODIFICATION OR ASSISTANCE TO COMPLETE EVALUATION: No modification of tasks or assist necessary to complete an evaluation.  OT OCCUPATIONAL PROFILE AND HISTORY: Problem focused assessment: Including review of records relating to presenting problem.  CLINICAL DECISION MAKING: Moderate - several treatment options, min-mod task modification necessary  REHAB POTENTIAL: Good  EVALUATION COMPLEXITY: Low    PLAN:  OT FREQUENCY: 2x/week  OT DURATION: 6 weeks, plus evaluation  PLANNED INTERVENTIONS: self care/ADL training, therapeutic exercise, therapeutic activity, neuromuscular re-education, manual therapy, passive range of motion, functional mobility training, splinting, patient/family education, cognitive remediation/compensation, visual/perceptual remediation/compensation, coping strategies training, DME and/or AE instructions, and Re-evaluation  RECOMMENDED OTHER SERVICES: none at this time  CONSULTED AND AGREED WITH PLAN OF CARE: Patient and spouse  PLAN FOR NEXT SESSION:   Continue to work on endurance, UE strengthening (within new pacemaker precautions), standing tolerance  Continue to review visual scanning strategies and safety awareness Update HEP PRN   Wynetta Emery, OT 10/22/2023, 11:40 AM

## 2023-10-23 ENCOUNTER — Other Ambulatory Visit: Payer: Self-pay

## 2023-10-23 NOTE — Patient Outreach (Signed)
Care Management  Transitions of Care Program Transitions of Care Post-discharge week 3   10/23/2023 Name: Gregory [name] MRN: [id number] DOB: 02-May-1951  Subjective: Gregory [name] is a 72 y.o. year old male who is a primary care patient of Gregory Nian, MD. The Care Management team Engaged with patient Engaged with patient by telephone to assess and address transitions of care needs.   Consent to Services:  Patient was given information about care management services, agreed to services, and gave verbal consent to participate.   Assessment:   Patient voices no new complaints Patient has not developed/ reported any new Medical issues / Dx or acute changes.- since last follow-up call for most recent  Hospital stay    10/14-10/16 / 2024  He is doing very well. He had follow-up 10/30-surgical f/u wound Area is healing well, no s/s infection.10/31 Had echo 60-65% EF. He will be released from precautions 11/27. He is able ro return to work, as long as he follows precautions until 11/27. He had appt for new glasses today,He continues to see PT/OT/SLP,He is doing well,amb and navigating obstacles  he uses a walker and furniture for support in home. He goes to the gym regularly  He has increased energy,His sats running 97%-99%.He had no questions or concerns at this time Patient educated on red flags s/s to watch for and was encouraged to report any of these identified , any new symptoms , changes in baseline or  medication regimen,  change in health status  /  well-being, or safety concerns to PCP and / or the  [facility] Case Management team .          SDOH Interventions    Flowsheet Row Patient Outreach from 10/23/2023 in [facility]PART[facility] Telephone from 10/08/2023 in  POPULATION HEALTH DEPARTMENT Clinical Support from 04/14/2023 in [facility] Care Coordination from 02/12/2023 in [facility] Clinical Support from 04/11/2022 in [place]  [facility] Chronic Care Management from 11/07/2021 in [facility]  SDOH Interventions        Food Insecurity Interventions Intervention Not Indicated -- Intervention Not Indicated Intervention Not Indicated -- --  Housing Interventions Intervention Not Indicated -- Intervention Not Indicated Intervention Not Indicated -- --  Transportation Interventions Intervention Not Indicated, Patient Resources (Friends/Family) -- Intervention Not Indicated Intervention Not Indicated -- Intervention Not Indicated  Utilities Interventions Intervention Not Indicated -- Intervention Not Indicated Intervention Not Indicated -- --  Alcohol Usage Interventions -- -- Intervention Not Indicated (Score <7) Intervention Not Indicated (Score <7) -- --  Depression Interventions/Treatment  -- -- VHQ4-6 Score <4 Follow-up Not Indicated -- PHQ2-9 Score <4 Follow-up Not Indicated --  Financial Strain Interventions -- -- Intervention Not Indicated Intervention Not Indicated -- Intervention Not Indicated  Physical Activity Interventions -- -- Intervention Not Indicated Intervention Not Indicated -- --  Stress Interventions -- -- Patient Refused -- -- --  Social Connections Interventions -- -- Intervention Not Indicated -- -- --  Health Literacy Interventions -- Intervention Not Indicated -- -- -- --        Goals Addressed             This Visit's Progress    TOC Care Plan       Current Barriers:  Medication management No missed medications, will report any adverse side effects Provider appointments Cardiology, Neurology- and outpatient PT/OT SLP  RNCM Clinical Goal(s):  Patient will verbalize understanding of plan for management of Atrial Fibrillation as  evidenced by heart rate will remain between 60-100- occ tachy 108 Pacemaker check rates 59-120   take all medications exactly as prescribed and will call provider for medication related questions as evidenced by no unreported adverse side effect or  unmanaged symptoms  attend all scheduled medical appointments: Cardiology, Neurology and PCP  as evidenced by no missed appointments , labs or testing   through collaboration with RN Care manager, provider, and care team.   Interventions: Evaluation of current treatment plan related to  self management and patient's adherence to plan as established by provider  Transitions of Care:  Goal on track:  Yes. Doctor Visits  - discussed the importance of doctor visits- cardio 10/28, then 11/18 Wound check s/p PPM implant 10/30. Will call  Cardiologist for request back to work, going to gym, Will get Flu vaccine  Patient Goals/Self-Care Activities: Participate in Transition of Care Program/Attend TOC scheduled calls Take all medications as prescribed Attend all scheduled provider appointments Call pharmacy for medication refills 3-7 days in advance of running out of medications Perform all self care activities independently  Perform IADL's (shopping, preparing meals, housekeeping, managing finances) independently Call provider office for new concerns or questions   Follow Up Plan:  Telephone follow up appointment with care management team member scheduled for:  10/30/23 @ 10:00am The patient has been provided with contact information for the care management team and has been advised to call with any health related questions or concerns.          Plan: The patient has been provided with contact information for the care management team and has been advised to call with any health related questions or concerns.  Routine follow-up and on-going assessment evaluation and education of disease processes, recommended interventions for both chronic and acute medical conditions , will occur during each weekly visit along with ongoing review of symptoms ,medication reviews and reconciliation. Any updates , inconsistencies, discrepancies or acute care concerns will be addressed and routed to the correct  Practitioner if indicated   Please refer to Care Plan for goals and interventions -Effectiveness of interventions, symptom management and outcomes will be evaluated  weekly during [facility] 30-day Program Outreach calls  . Any necessary  changes and updates to Care Plan will be completed episodically    Reviewed goals for care  Patient provided with Contact information and verbalized understanding with current POC.   [name] , BSN, RN Care Management Coordinator [place]   [facility] [email or web address] Direct Dial: [phone]

## 2023-10-27 ENCOUNTER — Encounter: Payer: PPO | Admitting: Occupational Therapy

## 2023-10-27 ENCOUNTER — Ambulatory Visit: Payer: PPO

## 2023-10-27 NOTE — Therapy (Unsigned)
OUTPATIENT SPEECH LANGUAGE PATHOLOGY TREATMENT   Patient Name: Gregory [name] MRN: [id number] DOB:January 06, 1951, 72 y.o., male Today's Date: 10/28/2023  PCP: Ronnald Nian, MD REFERRING PROVIDER: Ronnald Nian, MD  END OF SESSION:  End of Session - 10/28/23 0800     Visit Number 4    Number of Visits 18    Date for SLP Re-Evaluation 11/25/23    SLP Start Time 0850    SLP Stop Time  0930    SLP Time Calculation (min) 40 min    Activity Tolerance Patient tolerated treatment well               Past Medical History:  Diagnosis Date   Alcohol abuse    Aortic stenosis    Arthritis    Asthma    exercise indused or pollen-uses inhaler dail yand has rescue inhaler if needed   Bipolar disorder (HCC)    CAD (coronary artery disease)    coronary calcifications 08/2013 CTA   Carotid artery occlusion    Cataract    bilateral sx   Chronic back pain    Claudication (HCC)    Complication of anesthesia 10/16/2020   "had a panic attack when the mask was put on me"" I do not think I was given anything  before, I need something to calm me down"   Depression    Family history of skin cancer    Heart murmur    as an infant   Hepatitis    h/o 1970,doesn't remember which type,1990 epstain-barr   Herpes zoster without complication 09/05/2019   Hyperlipidemia    Hypertension    Neuromuscular disorder (HCC)    Obsessive compulsive disorder    PAD (peripheral artery disease) (HCC)    Peripheral neuropathy    Pneumonia    RBBB    RBBB (right bundle branch block with left anterior fascicular block)    Seasonal allergies    Severe aortic stenosis    Sleep apnea    does not wear CPAP   Smoker    Spinal stenosis at L4-L5 level    Tobacco abuse    Tobacco use disorder    Past Surgical History:  Procedure Laterality Date   ANAL FISTULECTOMY  09/25/2011   AORTA - BILATERAL FEMORAL ARTERY BYPASS GRAFT N/A 10/03/2020   Procedure: AORTA BIFEMORAL BYPASS GRAFT USING A HEMASHIELD  GOLD BIFURCATED 14 X 7mm GRAFT AND INFERIOR MESENTERIC ARTERY REIMPLANTATION;  Surgeon: Nada Libman, MD;  Location: [facility] OR;  Service: Vascular;  Laterality: N/A;   AORTIC VALVE REPLACEMENT N/A 11/24/2019   Procedure: AORTIC VALVE REPLACEMENT (AVR) using INSPIRIS Resilia 23 MM Bioprosthetic Aortic Valve.;  Surgeon: Alleen Borne, MD;  Location: [facility] OR;  Service: Open Heart Surgery;  Laterality: N/A;   APPLICATION OF WOUND VAC Left 10/17/2020   Procedure: APPLICATION OF WOUND VAC;  Surgeon: Nada Libman, MD;  Location: [facility] OR;  Service: Vascular;  Laterality: Left;   BACK SURGERY     2015   CATARACT EXTRACTION, BILATERAL  2022   COLONOSCOPY  2009   MS-F/V-mov(exc)-HPP   CORONARY ARTERY BYPASS GRAFT N/A 11/24/2019   Procedure: CORONARY ARTERY BYPASS GRAFTING (CABG) using LIMA to LAD.;  Surgeon: Alleen Borne, MD;  Location: [facility] OR;  Service: Open Heart Surgery;  Laterality: N/A;   ELBOW SURGERY     bilaterally for cubital tunnel   ENDARTERECTOMY N/A 10/03/2020   Procedure: AORTIC ENDARTERECTOMY;  Surgeon: Nada Libman, MD;  Location: [facility];  Service: Vascular;  Laterality: N/A;   ENDARTERECTOMY FEMORAL Bilateral 10/03/2020   Procedure: BILATERAL FEMORAL ENDARTERECTOMY;  Surgeon: Nada Libman, MD;  Location: [facility];  Service: Vascular;  Laterality: Bilateral;   HERNIA REPAIR     with mesh   INCISION AND DRAINAGE Left 01/02/2021   Procedure: INCISION AND DRAINAGE LEFT GROIN WITH STIMULAN BEADS;  Surgeon: Nada Libman, MD;  Location: [facility];  Service: Vascular;  Laterality: Left;   INCISION AND DRAINAGE OF WOUND Left 10/17/2020   Procedure: EXPLORATION LEFT GROIN WOUND;  Surgeon: Nada Libman, MD;  Location: [facility];  Service: Vascular;  Laterality: Left;   MAXIMUM ACCESS (MAS)POSTERIOR LUMBAR INTERBODY FUSION (PLIF) 1 LEVEL N/A 10/25/2014   Procedure: LUMBAR FOUR TO FIVE MAXIMUM ACCESS (MAS) POSTERIOR LUMBAR INTERBODY FUSION (PLIF) 1 LEVEL;  Surgeon: Tia Alert, MD;   Location: [facility];  Service: Neurosurgery;  Laterality: N/A;   PACEMAKER IMPLANT N/A 10/06/2023   Procedure: PACEMAKER IMPLANT;  Surgeon: Regan Lemming, MD;  Location: [facility];  Service: Cardiovascular;  Laterality: N/A;   RIGHT/LEFT HEART CATH AND CORONARY ANGIOGRAPHY N/A 11/10/2019   Procedure: RIGHT/LEFT HEART CATH AND CORONARY ANGIOGRAPHY;  Surgeon: Swaziland, [name], MD;  Location: [facility] INVASIVE CV LAB;  Service: Cardiovascular;  Laterality: N/A;   SKIN BIOPSY Left 10/12/2018   shave forehead Hypertrophic actinic kertosis with features of a verruca   TEE WITHOUT CARDIOVERSION N/A 11/24/2019   Procedure: TRANSESOPHAGEAL ECHOCARDIOGRAM (TEE);  Surgeon: Alleen Borne, MD;  Location: [facility];  Service: Open Heart Surgery;  Laterality: N/A;   TONSILLECTOMY  age 85   VASECTOMY     X 2   VASECTOMY REVERSAL     Patient Active Problem List   Diagnosis Date Noted   Symptomatic bradycardia 10/05/2023   Subarachnoid hemorrhage following injury (HCC) 08/26/2023   Skull fracture with cerebral contusion (HCC) 08/26/2023   Temporal bone fracture (HCC) 08/26/2023   Aortic atherosclerosis (HCC) 05/26/2023   Acute non-recurrent pansinusitis 01/04/2023   Perennial allergic rhinitis 07/09/2022   Asthma-COPD overlap syndrome (HCC) 04/21/2022   Recurrent infections 04/21/2022   S/P aortobifemoral bypass surgery 04/03/2021   S/P CABG x 1 11/24/2019   S/P aortic valve replacement with bioprosthetic valve 11/24/2019   PAD (peripheral artery disease) (HCC)    Mild persistent asthma 10/29/2019   RBBB (right bundle branch block with left anterior fascicular block)    Peripheral neuropathy    S/P lumbar spinal fusion 10/25/2014   Recovering alcoholic in remission (HCC) 08/10/2014   Substance induced mood disorder (HCC) 06/30/2014   Substance-induced sleep disorder (HCC) 06/30/2014   Obsessive compulsive disorder    OSA (obstructive sleep apnea) 01/20/2014   CAD (coronary artery  disease)    Mentally disabled 11/25/2012   Hyperlipidemia with target LDL less than 130 11/25/2012   Bipolar disorder (HCC)    Hypertension    Allergic rhinitis due to pollen 08/11/2011    ONSET DATE: 08/25/23   REFERRING DIAG: W09.9X1D (ICD-10-CM) - Traumatic brain injury, with loss of consciousness of 30 minutes or less, subsequent encounter R41.3 (ICD-10-CM) - Memory deficit  THERAPY DIAG: Cognitive communication deficit  Rationale for Evaluation and Treatment: Rehabilitation  SUBJECTIVE:   SUBJECTIVE STATEMENT: Per wife: "it's like a light bulb went off in his brain"   Pt accompanied by: significant other Chales Abrahams  PERTINENT HISTORY: Pt reports long history of cardiac issues/dizziness, but has never lost consciousness. Pt reports he felt dizzy and was reaching up at  an aisle in [employer]. Lost consciousness and fell backwards onto the concrete floor and his his head. Does not know how long he was unconscious, was found by another shopper. Has a history of bradycardia, wore a heart monitor for 2 weeks and was cleared by his cardiologist in August to exercise prior to this incident. Works as a Investment banker, operational at [employer] and is used to weightlifting/cardio several times per week.   PAIN:  Are you having pain? No  FALLS: Has patient fallen in last 6 months?  See PT evaluation for details  LIVING ENVIRONMENT: Lives with: lives with their spouse Lives in: House/apartment  PLOF:  Level of assistance: Independent with ADLs, Independent with IADLs Employment: Part-time employment  PATIENT GOALS: "To go back to work and drive"  OBJECTIVE:   TODAY'S TREATMENT:                                                                                                                                         10/28/23: Pt reports overall cognitive improvement with home-based tasks. He accurately recalled and implemented trained techniques discussed last session. Endorsed attention challenges completing  entirety of task before moving on to the next (ex: putting items used to make lunch away). SLP A pt in problem-solving strategies to remind himself to return to task with min prompting. Pt questioned activities to increase cognitive functioning. SLP provided handout and educated pt on the "mind diet" and activities to promote brain health. Wife reports concern for pt's desire to return to work due to current cognitive skills. Plan to address next session.   10/21/23: Endorsed feeling frustrated with management of fluctuating schedule, numerous appointments, and health journey in general. Demonstrated ability to re-frame and reduce frustration re: therapeutic process and recovery expectations, which has aided mental clarity. Identified he was previously successful when structure and rigid framework in place (ex: work) versus daily schedule differences. Recommended using prior strengths to modify/compensate for current cognitive challenges, such as using visual aids and verbal sequencing to aid retention and attention. Endorsed some difficulty mult-taking at home, resulting in deviations in attention and forgetting to use walker. Targeted meta-cognitive analysis and cognitive strategy selection with pt able to ID appropriate solution with min prompting. Pt able demo and carryover targeted solution (enclosing self with walker) with rare min A in stimulating environment. Reported overall good carryover of targeted recommendations from last session, which aided safety at home.    10/14/23: Pt experienced medical event since prior evaluation session and had a pacemaker implanted. Endorses use of walker and limited challenges, although wife reports otherwise. Wife stated that pt is not using walker consistently, such as walking by it when leaving couch, leaving it in one kitchen door and exiting out the other, and not bringing it to the bathroom. Pt agreed with wife's statements and commented "out of sight out of  mind." SLP led pt and  wife in generating functional solutions, with wife stating she can place chair in second kitchen doorway as a visual cue to use the other one (where walker is placed). Additionally SLP recommended keeping walker within a couple feet at all times, such as when watching tv on the couch. Pt reports returning to simple cooking tasks with wife assisting with cutting, as pt cannot use knife. Demonstrated anticipatory awareness skills when noting that 3-minutes was too long for toast (it burned) and that he would set the timer for less next time.   09/30/23: Reviewed goals - eval only   PATIENT EDUCATION: Education details: See Today's Treatment, See Patient Instructions, compensations for cognition, accommodations in workplace Person educated: Patient and Spouse Education method: Explanation and Verbal cues Education comprehension: verbal cues required and needs further education   GOALS: Goals reviewed with patient? Yes  SHORT TERM GOALS: Target date: 10/28/23  Pt will carryover 2 strategies for attention to successfully carryover household tasks that require alternating attention with supervision cues.  Baseline: Goal status: MET  2.  Pt will make safe decisions at home during household tasks with rare min A Baseline:  Goal status: MET  3.  Pt will verbalize 3 compensations for attention when driving (for if/when he is cleared my MD to drive) Baseline:  Goal status: DEFERRED   4. Pt will verbalize safety awareness in kitchen, garden, and work with rare min A             Goal status: MET   LONG TERM GOALS: Target date: 11/25/23  Pt will carryover compensations for attention on divided attention tasks at home Baseline:  Goal status: IN PROGRESS  2.  Pt will verbalize 3 strategies to support alternating and divided attention at work with rare min A Baseline:  Goal status: IN PROGRESS  3.  Pt will verbalize 3 accommodations to support cognition and energy  conservation upon return to work Baseline:  Goal status: IN PROGRESS  ASSESSMENT:  CLINICAL IMPRESSION: Patient is a 72 y.o. male who was seen today for cognition s/p TBI and fall. Today he presents with mild high level cognitive impairment. He is accompanied by his spouse, Chales Abrahams. He demonstrates increasing safety awareness in a variety of situations at home. Some alternating/divided attention challenges reported. SLP conducted ongoing education of cognitive strategies and assisted in problem-solving household situations. Wife reports concern re: current cognitive functioning impacting return to work.  He hopes to return to work soon.  Pt will continue to benefit from ST to maximize safety for possible return to work, household tasks, and possible return to driving.   OBJECTIVE IMPAIRMENTS: include attention and awareness. These impairments are limiting patient from return to work and household responsibilities. Factors affecting potential to achieve goals and functional outcome are  n/a .[name] Patient will benefit from skilled SLP services to address above impairments and improve overall function.  REHAB POTENTIAL: Good  PLAN:  SLP FREQUENCY: 2x/week  SLP DURATION: 8 weeks  PLANNED INTERVENTIONS: Environmental controls, Cueing hierachy, Cognitive reorganization, Internal/external aids, Functional tasks, and Multimodal communication approach    Gracy Racer, CCC-SLP 10/28/2023, 11:30 AM

## 2023-10-28 ENCOUNTER — Ambulatory Visit: Payer: PPO | Attending: Family Medicine | Admitting: Physical Therapy

## 2023-10-28 ENCOUNTER — Ambulatory Visit: Payer: PPO

## 2023-10-28 VITALS — BP 128/68 | HR 64

## 2023-10-28 DIAGNOSIS — I69018 Other symptoms and signs involving cognitive functions following nontraumatic subarachnoid hemorrhage: Secondary | ICD-10-CM | POA: Diagnosis not present

## 2023-10-28 DIAGNOSIS — R41841 Cognitive communication deficit: Secondary | ICD-10-CM | POA: Insufficient documentation

## 2023-10-28 DIAGNOSIS — R41842 Visuospatial deficit: Secondary | ICD-10-CM | POA: Diagnosis not present

## 2023-10-28 DIAGNOSIS — M6281 Muscle weakness (generalized): Secondary | ICD-10-CM | POA: Insufficient documentation

## 2023-10-28 DIAGNOSIS — R42 Dizziness and giddiness: Secondary | ICD-10-CM | POA: Insufficient documentation

## 2023-10-28 DIAGNOSIS — R2689 Other abnormalities of gait and mobility: Secondary | ICD-10-CM | POA: Diagnosis not present

## 2023-10-28 DIAGNOSIS — R2681 Unsteadiness on feet: Secondary | ICD-10-CM | POA: Diagnosis not present

## 2023-10-28 DIAGNOSIS — Z9181 History of falling: Secondary | ICD-10-CM | POA: Insufficient documentation

## 2023-10-28 NOTE — Therapy (Addendum)
OUTPATIENT PHYSICAL THERAPY NEURO TREATMENT   Patient Name: Gregory [name] MRN: [id number] DOB:02-Nov-1951, 72 y.o., male Today's Date: 10/28/2023   PCP: Ronnald Nian, MD REFERRING PROVIDER: Willeen Niece, MD  END OF SESSION:  PT End of Session - 10/28/23 0806     Visit Number 13    Number of Visits 18    Date for PT Re-Evaluation 12/07/23    Authorization Type [employer]    PT Start Time 0803    PT Stop Time 0846    PT Time Calculation (min) 43 min    Equipment Utilized During Treatment Gait belt    Activity Tolerance Patient tolerated treatment well    Behavior During Therapy WFL for tasks assessed/performed             Past Medical History:  Diagnosis Date   Alcohol abuse    Aortic stenosis    Arthritis    Asthma    exercise indused or pollen-uses inhaler dail yand has rescue inhaler if needed   Bipolar disorder (HCC)    CAD (coronary artery disease)    coronary calcifications 08/2013 CTA   Carotid artery occlusion    Cataract    bilateral sx   Chronic back pain    Claudication (HCC)    Complication of anesthesia 10/16/2020   "had a panic attack when the mask was put on me"" I do not think I was given anything  before, I need something to calm me down"   Depression    Family history of skin cancer    Heart murmur    as an infant   Hepatitis    h/o 1970,doesn't remember which type,1990 epstain-barr   Herpes zoster without complication 09/05/2019   Hyperlipidemia    Hypertension    Neuromuscular disorder (HCC)    Obsessive compulsive disorder    PAD (peripheral artery disease) (HCC)    Peripheral neuropathy    Pneumonia    RBBB    RBBB (right bundle branch block with left anterior fascicular block)    Seasonal allergies    Severe aortic stenosis    Sleep apnea    does not wear CPAP   Smoker    Spinal stenosis at L4-L5 level    Tobacco abuse    Tobacco use disorder    Past Surgical History:  Procedure Laterality Date   ANAL  FISTULECTOMY  09/25/2011   AORTA - BILATERAL FEMORAL ARTERY BYPASS GRAFT N/A 10/03/2020   Procedure: AORTA BIFEMORAL BYPASS GRAFT USING A HEMASHIELD GOLD BIFURCATED 14 X 7mm GRAFT AND INFERIOR MESENTERIC ARTERY REIMPLANTATION;  Surgeon: Nada Libman, MD;  Location: [facility];  Service: Vascular;  Laterality: N/A;   AORTIC VALVE REPLACEMENT N/A 11/24/2019   Procedure: AORTIC VALVE REPLACEMENT (AVR) using INSPIRIS Resilia 23 MM Bioprosthetic Aortic Valve.;  Surgeon: Alleen Borne, MD;  Location: [facility];  Service: Open Heart Surgery;  Laterality: N/A;   APPLICATION OF WOUND VAC Left 10/17/2020   Procedure: APPLICATION OF WOUND VAC;  Surgeon: Nada Libman, MD;  Location: [facility];  Service: Vascular;  Laterality: Left;   BACK SURGERY     2015   CATARACT EXTRACTION, BILATERAL  2022   COLONOSCOPY  2009   MS-F/V-mov(exc)-HPP   CORONARY ARTERY BYPASS GRAFT N/A 11/24/2019   Procedure: CORONARY ARTERY BYPASS GRAFTING (CABG) using LIMA to LAD.;  Surgeon: Alleen Borne, MD;  Location: [facility];  Service: Open Heart Surgery;  Laterality: N/A;   ELBOW SURGERY  bilaterally for cubital tunnel   ENDARTERECTOMY N/A 10/03/2020   Procedure: AORTIC ENDARTERECTOMY;  Surgeon: Nada Libman, MD;  Location: [facility];  Service: Vascular;  Laterality: N/A;   ENDARTERECTOMY FEMORAL Bilateral 10/03/2020   Procedure: BILATERAL FEMORAL ENDARTERECTOMY;  Surgeon: Nada Libman, MD;  Location: [facility];  Service: Vascular;  Laterality: Bilateral;   HERNIA REPAIR     with mesh   INCISION AND DRAINAGE Left 01/02/2021   Procedure: INCISION AND DRAINAGE LEFT GROIN WITH STIMULAN BEADS;  Surgeon: Nada Libman, MD;  Location: [facility];  Service: Vascular;  Laterality: Left;   INCISION AND DRAINAGE OF WOUND Left 10/17/2020   Procedure: EXPLORATION LEFT GROIN WOUND;  Surgeon: Nada Libman, MD;  Location: [facility];  Service: Vascular;  Laterality: Left;   MAXIMUM ACCESS (MAS)POSTERIOR LUMBAR INTERBODY FUSION (PLIF) 1 LEVEL N/A  10/25/2014   Procedure: LUMBAR FOUR TO FIVE MAXIMUM ACCESS (MAS) POSTERIOR LUMBAR INTERBODY FUSION (PLIF) 1 LEVEL;  Surgeon: Tia Alert, MD;  Location: [facility];  Service: Neurosurgery;  Laterality: N/A;   PACEMAKER IMPLANT N/A 10/06/2023   Procedure: PACEMAKER IMPLANT;  Surgeon: Regan Lemming, MD;  Location: [facility];  Service: Cardiovascular;  Laterality: N/A;   RIGHT/LEFT HEART CATH AND CORONARY ANGIOGRAPHY N/A 11/10/2019   Procedure: RIGHT/LEFT HEART CATH AND CORONARY ANGIOGRAPHY;  Surgeon: Swaziland, [name], MD;  Location: [facility] INVAS[facility] CV LAB;  Service: Cardiovascular;  Laterality: N/A;   SKIN BIOPSY Left 10/12/2018   shave forehead Hypertrophic actinic kertosis with features of a verruca   TEE WITHOUT CARDIOVERSION N/A 11/24/2019   Procedure: TRANSESOPHAGEAL ECHOCARDIOGRAM (TEE);  Surgeon: Alleen Borne, MD;  Location: [facility];  Service: Open Heart Surgery;  Laterality: N/A;   TONSILLECTOMY  age 57   VASECTOMY     X 2   VASECTOMY REVERSAL     Patient Active Problem List   Diagnosis Date Noted   Symptomatic bradycardia 10/05/2023   Subarachnoid hemorrhage following injury (HCC) 08/26/2023   Skull fracture with cerebral contusion (HCC) 08/26/2023   Temporal bone fracture (HCC) 08/26/2023   Aortic atherosclerosis (HCC) 05/26/2023   Acute non-recurrent pansinusitis 01/04/2023   Perennial allergic rhinitis 07/09/2022   Asthma-COPD overlap syndrome (HCC) 04/21/2022   Recurrent infections 04/21/2022   S/P aortobifemoral bypass surgery 04/03/2021   S/P CABG x 1 11/24/2019   S/P aortic valve replacement with bioprosthetic valve 11/24/2019   PAD (peripheral artery disease) (HCC)    Mild persistent asthma 10/29/2019   RBBB (right bundle branch block with left anterior fascicular block)    Peripheral neuropathy    S/P lumbar spinal fusion 10/25/2014   Recovering alcoholic in remission (HCC) 08/10/2014   Substance induced mood disorder (HCC) 06/30/2014    Substance-induced sleep disorder (HCC) 06/30/2014   Obsessive compulsive disorder    OSA (obstructive sleep apnea) 01/20/2014   CAD (coronary artery disease)    Mentally disabled 11/25/2012   Hyperlipidemia with target LDL less than 130 11/25/2012   Bipolar disorder (HCC)    Hypertension    Allergic rhinitis due to pollen 08/11/2011    ONSET DATE: 08/28/2023, resume orders 10/13/2023 provided by Dr. Loman Brooklyn, MD  REFERRING DIAG: R55 (ICD-10-CM) - Syncope, unspecified syncope type I60.9 (ICD-10-CM) - Subarachnoid hemorrhage (HCC)  THERAPY DIAG:  Unsteadiness on feet  Other abnormalities of gait and mobility  Muscle weakness (generalized)  Rationale for Evaluation and Treatment: Rehabilitation  SUBJECTIVE:  SUBJECTIVE STATEMENT: Pt reports doing well. Went to an AA meeting last night for the first time since his injury. Has not had time to return to the gym due to schedule. Has gone on one walk since last session. Wife reports she notices increased lateral instability when pt is tired, but would not say he is losing balance. Is getting new glasses on the 12th that should help with peripheral vision.   Pt accompanied by:  Wife, Chales Abrahams   PERTINENT HISTORY:  pacemaker placement 09/29/2023, CABG, aortic valve replacement (2021)  PAIN:  Are you having pain? Yes: NPRS scale: 3-4/10 Pain location: L shoulder Pain description: achy Aggravating factors: Movement  Relieving factors: tylenol  PRECAUTIONS: Fall and Other: no bending forward (skull fx), cannot lift more than 10lbs until November 18, 2023   PATIENT GOALS: "To reduce my symptoms."  OBJECTIVE:   DIAGNOSTIC FINDINGS:   CT Head wo Contrast 09/08/2023 IMPRESSION: 1. Expected evolution of multicompartmental intracranial  hemorrhage since 08/28/2023, with no residual hyperdense blood. 2. 3 mm thick hypodense right parietal convexity subdural collection is unchanged and may reflect a subdural hygroma or evolving subdural hematoma. 3. Unchanged right temporal/parietal bone fracture.  CT Lumbar Spine wo Contrast 09/08/2023: IMPRESSION: 1. No acute fracture or traumatic malalignment of the lumbar spine. 2. Postsurgical changes reflecting posterior instrumented fusion and decompression at L5-S1 without residual spinal canal or neural foraminal stenosis. 3. Adjacent segment disease at L3-L4 resulting in at least moderate spinal canal stenosis and moderate bilateral neural foraminal stenosis.  CT of head from 08/27/23   IMPRESSION: 1. No significant interval change in bilateral frontal convexity subdural fluid collection measuring up to 6 mm. 2. Redemonstrated subarachnoid hemorrhage and hemorrhagic contusions along the bilateral anterior frontal lobes, slightly increased on the left measuring up 2.1 cm ( previously 1.9 cm), and unchanged along the right frontal lobe. Compared to prior exam there is interval increase in conspicuity of subarachnoid blood products along the bilateral parietal lobes, likely due to redistribution. No evidence of intraventricular extension. No hydrocephalus. 3. Redemonstrated acute nondisplaced fracture involving the right temporal bone.   VITALS Vitals:   10/28/23 0818  BP: 128/68  Pulse: 64    Seated on R arm; patient had not taken BP meds  HR: 88-60 bpm, SPO2: 99%  TODAY'S TREATMENT:           Ther Act  Assessed vitals (see above) and within limits for therapy Educated pt on clearance to ambulate throughout house w/o AD w/S*. Pt and wife verbalized agreement and wife feels comfortable with this as pt has been doing well w/balance.   Ther Ex  SciFit multi-peaks level 7.5 for 8 minutes using BUE/BLEs for neural priming for reciprocal movement, dynamic cardiovascular  warmup and increased amplitude of stepping. Cued pt to maintain steps/min between 70-80 to avoid overdoing exercise. Pt did well with this. RPE of 4/10 following activity. HR at 61 bpm and SpO2 at 98% following session.  Lateral/fwd/retro monster walks w/red theraband at ballet bar, x40' each direction, for improved hip strength and endurance. Min cues to perform in mini squat position for added glute activation. Added to HEP (see bolded below) for pt to perform at home in hallway or at counter w/S*   Gait pattern: step through pattern, decreased arm swing- Right, decreased arm swing- Left, lateral hip instability, decreased trunk rotation, and narrow BOS Distance walked: Various clinic distances  Assistive device utilized: Walker - 2 wheeled and None Level of assistance: Modified independence and  SBA Comments: Pt ambulated into/out of session w/RW at mod I level. Pt ambulated throughout session w/o AD w/SBA and no instability noted      PATIENT EDUCATION: Education details: Additions to HEP, ambulating at home w/no AD and S*  Person educated: Patient and Spouse Education method: Explanation, Demonstration, and Handouts Education comprehension: verbalized understanding, returned demonstration, and needs further education  HOME EXERCISE PROGRAM:  Access Code: [email or web address] URL: [email or web address] Date: 09/08/2023 Prepared by: Maryruth Eve  Exercises - Corner Balance Feet Together With Eyes Closed  - 1 x daily - 7 x weekly - 3 sets - 30 seconds hold - Pencil Pushups  - 1 x daily - 7 x weekly - 3 sets - 10 reps - Seated Horizontal Saccades  - 1 x daily - 7 x weekly - 3 sets - 10 reps - Seated Vertical Saccades  - 1 x daily - 7 x weekly - 3 sets - 10 reps -[place] eye tracking (1 paragraph)  -King Devick's Saccades 1x a day - Side Stepping with Resistance at Thighs and Counter Support  - 1 x daily - 7 x weekly - 3 sets - 10 reps - Forward Backward Monster Walk with Band at  Thighs and Counter Support  - 1 x daily - 7 x weekly - 3 sets - 10 reps   Education on SPOT it game and HART chart use, VOR x 1 viewing 3 x 5 rounds Brock string ~3x a day with spouse guiding through   GOALS: Goals reviewed with patient? Yes  SHORT TERM GOALS: Target date: 09/29/2023   Pt will be independent with initial HEP for improved balance, transfers and gait.  Baseline: not established on eval; provided Goal status: MET  2.  Patient will improve RPQ-3 score to </= 2 per item to indicate reduced severity of post-concussive symptoms to progress towards PLOF.    Baseline: 3/4, 3/4 Goal status: NOT MET  3.  5x STS to be assessed and STG/LTG updated  Baseline: 15.65 seconds without UE use (SBA) Goal status: Discontinued STG due to how well performed   4.  Patient will improve mCTSIB score to 100/120 to indicate improved integration of vestibular, proprioceptive, and visual balance systems during static balance tasks in order to reduce risk for falls.   Baseline: 86/120; 120/120 Goal status: MET   LONG TERM GOALS: Target date: 10/27/2023   Pt will be independent with final HEP for improved balance, transfers and gait.  Baseline: Reports partial compliance  Goal status: IN PROGRESS   2.  Patient will improve RPQ-13 score to </= 2 per item to indicate reduced severity of post-concussive symptoms to progress towards PLOF.   Baseline: greatest deficit reported 4/4, greatest defciti 3/4 on fatige and feeling depressed Goal status: NOT MET BUT PROGRESSED   3.  Patient will improve their 5x Sit to Stand score to less than 13 seconds to demonstrate a decreased risk for falls and improved LE strength.   Baseline: 15.65 seconds without UE use (SBA); 9.2 seconds without UE use (SBA); improved to 9.2 seconds  Goal status: MET  4. Patient will improve mCTSIB score to 120/120 to indicate improved integration of vestibular, proprioceptive, and visual balance systems during static  balance tasks in order to reduce risk for falls.   Baseline: 86/120; 120/120  Goal status: MET  LONG TERM GOALS UPDATED FOLLOWING RETURN TO CARE: Target date: 12/07/2023  Pt will be independent with final HEP for improved balance, transfers and gait. Baseline: Reports partial compliance  Goal status: IN PROGRESS   2.  Patient will improve RPQ-13 score to </= 2 per item to indicate reduced severity of post-concussive symptoms to progress towards PLOF.   Baseline: greatest deficit reported 4/4, greatest defciti 3/4 on fatige and feeling depressed Goal status: NOT MET BUT PROGRESSED   3.  SOT to be assessed and LTG updated  Baseline: To be assessed  Goal status: Discontinued at this time due to pacemaker incision placement and harness that needs to be donned with testing   ASSESSMENT:  CLINICAL IMPRESSION: Emphasis of skilled PT session on endurance, pt education regarding safety at home and BLE strength. Pt reports he has not been walking at home w/o AD because he was unsure if it was "allowed", so provided clearance for pt to try this w/S*. Pt reports he feels very weak in his legs, so added to HEP for functional strength. Pt ambulated throughout session w/o AD at SBA level and reports he is slowly reintegrating into social events, which is going well. Continue POC.   OBJECTIVE IMPAIRMENTS: Abnormal gait, decreased activity tolerance, decreased balance, decreased cognition, decreased coordination, decreased endurance, decreased knowledge of condition, decreased knowledge of use of DME, decreased mobility, difficulty walking, decreased strength, decreased safety awareness, dizziness, impaired perceived functional ability, impaired vision/preception, and pain  ACTIVITY LIMITATIONS: carrying, lifting, bending, sleeping, stairs, transfers, hygiene/grooming, locomotion level, and caring for others  PARTICIPATION LIMITATIONS: meal prep, cleaning, laundry, interpersonal relationship, driving,  shopping, community activity, occupation, and yard work  PERSONAL FACTORS: Fitness, Past/current experiences, and 3+ comorbidities: Bradycardia, post-concussion w/LOC,   are also affecting patient's functional outcome.   REHAB POTENTIAL: Good  CLINICAL DECISION MAKING: Unstable/unpredictable  EVALUATION COMPLEXITY: High  PLAN:  PT FREQUENCY: 2x/week  PT DURATION: 4 additional weeks since last seen for therapy   PLANNED INTERVENTIONS: Therapeutic exercises, Therapeutic activity, Neuromuscular re-education, Balance training, Gait training, Patient/Family education, Self Care, Joint mobilization, Stair training, Vestibular training, Canalith repositioning, DME instructions, Aquatic Therapy, Dry Needling, Electrical stimulation, Manual therapy, and Re-evaluation  PLAN FOR NEXT SESSION: progress occulomotor/vestibular exercises (VORx1 as tolerated, Brock string), progress corner balance (EC on foam, tandem with head turns as able etc), consider neuropthomology referral pending progress; did OT/speech get scheduled, HART chart with balance work, work on Building surveyor with balance training or dynamic gait/balance without AD with visual scanning, midline orientation,   Continue to work on gait without AD and progress higher level balance tasks as able. Pt would like to work on functional hip/LE strength    Brantley Naser E Carnetta Losada, PT, DPT  10/28/2023, 10:30 AM

## 2023-10-28 NOTE — Patient Instructions (Signed)
Keeping your Brain Healthy MIND diet healthy items the MIND diet guidelines* suggest include: 3+ servings a day of whole grains 1+ servings a day of vegetables (other than green leafy) 6+ servings a week of green leafy vegetables Consider dosing if you won't be having a salad: eat a daily handful of spinach, they make greens supplements  5+ servings a week of nuts 4+ meals a week of beans 2+ servings a week of berries 2+ meals a week of poultry 1+ meals a week of fish Mainly olive oil if added fat is used The unhealthy items, which are higher in saturated and trans fat, include: Less than 5 servings a week of pastries and sweets Less than 4 servings a week of red meat (including beef, pork, lamb, and products made from these meats) Less than one serving a week of cheese and fried foods Less than 1 tablespoon a day of butter   Exercise Consider what can I do safely?  Ask you PT, what can I do? Optimal exercise dosing in 150 minutes a week-- you can see benefits just increasing a little  Choreographed dancing is a wonderful physical and mental exercise!!   Cognitive work Challenge your brain Learn something new about a topic that is interesting to you Write down 3-5 things you learned After 10-15 minutes, see what you remember May be helpful to read again Share what you learned with a communication partner Partake in and learn a new hobby Plan a menu Participate in household chores and decisions  Participate in managing finances Plan a party, trip or tailgate with all of the details  Participate in your current hobbies Google search for items (even if you're not really going to buy anything) and compare prices and features   Social Interaction Engaging with others is good for your language, your mental health, and your brain function  

## 2023-10-29 ENCOUNTER — Ambulatory Visit: Payer: PPO

## 2023-10-29 ENCOUNTER — Encounter: Payer: PPO | Admitting: Occupational Therapy

## 2023-10-29 ENCOUNTER — Encounter: Payer: PPO | Admitting: Speech Pathology

## 2023-10-29 ENCOUNTER — Ambulatory Visit: Payer: PPO | Admitting: Occupational Therapy

## 2023-10-29 ENCOUNTER — Ambulatory Visit: Payer: PPO | Admitting: Physical Therapy

## 2023-10-29 VITALS — BP 145/73 | HR 61

## 2023-10-29 DIAGNOSIS — R2681 Unsteadiness on feet: Secondary | ICD-10-CM | POA: Diagnosis not present

## 2023-10-29 DIAGNOSIS — R41842 Visuospatial deficit: Secondary | ICD-10-CM

## 2023-10-29 DIAGNOSIS — I69018 Other symptoms and signs involving cognitive functions following nontraumatic subarachnoid hemorrhage: Secondary | ICD-10-CM

## 2023-10-29 DIAGNOSIS — M6281 Muscle weakness (generalized): Secondary | ICD-10-CM

## 2023-10-29 DIAGNOSIS — R2689 Other abnormalities of gait and mobility: Secondary | ICD-10-CM

## 2023-10-29 DIAGNOSIS — R42 Dizziness and giddiness: Secondary | ICD-10-CM

## 2023-10-29 DIAGNOSIS — S069X1A Unspecified intracranial injury with loss of consciousness of 30 minutes or less, initial encounter: Secondary | ICD-10-CM | POA: Diagnosis not present

## 2023-10-29 NOTE — Therapy (Signed)
OUTPATIENT PHYSICAL THERAPY NEURO TREATMENT   Patient Name: Gregory [name] MRN: [id number] DOB:1951-04-24, 72 y.o., male Today's Date: 10/29/2023   PCP: Ronnald Nian, MD REFERRING PROVIDER: Willeen Niece, MD  END OF SESSION:  PT End of Session - 10/29/23 0951     Visit Number 14    Number of Visits 18    Date for PT Re-Evaluation 12/07/23    Authorization Type [employer]    PT Start Time 1017   received from OT   PT Stop Time 1058    PT Time Calculation (min) 41 min    Activity Tolerance Patient tolerated treatment well    Behavior During Therapy [facility] for tasks assessed/performed             Past Medical History:  Diagnosis Date   Alcohol abuse    Aortic stenosis    Arthritis    Asthma    exercise indused or pollen-uses inhaler dail yand has rescue inhaler if needed   Bipolar disorder (HCC)    CAD (coronary artery disease)    coronary calcifications 08/2013 CTA   Carotid artery occlusion    Cataract    bilateral sx   Chronic back pain    Claudication (HCC)    Complication of anesthesia 10/16/2020   "had a panic attack when the mask was put on me"" I do not think I was given anything  before, I need something to calm me down"   Depression    Family history of skin cancer    Heart murmur    as an infant   Hepatitis    h/o 1970,doesn't remember which type,1990 epstain-barr   Herpes zoster without complication 09/05/2019   Hyperlipidemia    Hypertension    Neuromuscular disorder (HCC)    Obsessive compulsive disorder    PAD (peripheral artery disease) (HCC)    Peripheral neuropathy    Pneumonia    RBBB    RBBB (right bundle branch block with left anterior fascicular block)    Seasonal allergies    Severe aortic stenosis    Sleep apnea    does not wear CPAP   Smoker    Spinal stenosis at L4-L5 level    Tobacco abuse    Tobacco use disorder    Past Surgical History:  Procedure Laterality Date   ANAL FISTULECTOMY  09/25/2011   AORTA  - BILATERAL FEMORAL ARTERY BYPASS GRAFT N/A 10/03/2020   Procedure: AORTA BIFEMORAL BYPASS GRAFT USING A HEMASHIELD GOLD BIFURCATED 14 X 7mm GRAFT AND INFERIOR MESENTERIC ARTERY REIMPLANTATION;  Surgeon: Nada Libman, MD;  Location: [facility] OR;  Service: Vascular;  Laterality: N/A;   AORTIC VALVE REPLACEMENT N/A 11/24/2019   Procedure: AORTIC VALVE REPLACEMENT (AVR) using INSPIRIS Resilia 23 MM Bioprosthetic Aortic Valve.;  Surgeon: Alleen Borne, MD;  Location: [facility] OR;  Service: Open Heart Surgery;  Laterality: N/A;   APPLICATION OF WOUND VAC Left 10/17/2020   Procedure: APPLICATION OF WOUND VAC;  Surgeon: Nada Libman, MD;  Location: [facility] OR;  Service: Vascular;  Laterality: Left;   BACK SURGERY     2015   CATARACT EXTRACTION, BILATERAL  2022   COLONOSCOPY  2009   MS-F/V-mov(exc)-HPP   CORONARY ARTERY BYPASS GRAFT N/A 11/24/2019   Procedure: CORONARY ARTERY BYPASS GRAFTING (CABG) using LIMA to LAD.;  Surgeon: Alleen Borne, MD;  Location: [facility] OR;  Service: Open Heart Surgery;  Laterality: N/A;   ELBOW SURGERY     bilaterally for cubital tunnel  ENDARTERECTOMY N/A 10/03/2020   Procedure: AORTIC ENDARTERECTOMY;  Surgeon: Nada Libman, MD;  Location: [facility] OR;  Service: Vascular;  Laterality: N/A;   ENDARTERECTOMY FEMORAL Bilateral 10/03/2020   Procedure: BILATERAL FEMORAL ENDARTERECTOMY;  Surgeon: Nada Libman, MD;  Location: [facility] OR;  Service: Vascular;  Laterality: Bilateral;   HERNIA REPAIR     with mesh   INCISION AND DRAINAGE Left 01/02/2021   Procedure: INCISION AND DRAINAGE LEFT GROIN WITH STIMULAN BEADS;  Surgeon: Nada Libman, MD;  Location: [facility] OR;  Service: Vascular;  Laterality: Left;   INCISION AND DRAINAGE OF WOUND Left 10/17/2020   Procedure: EXPLORATION LEFT GROIN WOUND;  Surgeon: Nada Libman, MD;  Location: [facility] OR;  Service: Vascular;  Laterality: Left;   MAXIMUM ACCESS (MAS)POSTERIOR LUMBAR INTERBODY FUSION (PLIF) 1 LEVEL N/A 10/25/2014   Procedure: LUMBAR  FOUR TO FIVE MAXIMUM ACCESS (MAS) POSTERIOR LUMBAR INTERBODY FUSION (PLIF) 1 LEVEL;  Surgeon: Tia Alert, MD;  Location: [facility] NEURO ORS;  Service: Neurosurgery;  Laterality: N/A;   PACEMAKER IMPLANT N/A 10/06/2023   Procedure: PACEMAKER IMPLANT;  Surgeon: Regan Lemming, MD;  Location: [facility] INVASIVE CV LAB;  Service: Cardiovascular;  Laterality: N/A;   RIGHT/LEFT HEART CATH AND CORONARY ANGIOGRAPHY N/A 11/10/2019   Procedure: RIGHT/LEFT HEART CATH AND CORONARY ANGIOGRAPHY;  Surgeon: Swaziland, [name], MD;  Location: [facility] INVASIVE CV LAB;  Service: Cardiovascular;  Laterality: N/A;   SKIN BIOPSY Left 10/12/2018   shave forehead Hypertrophic actinic kertosis with features of a verruca   TEE WITHOUT CARDIOVERSION N/A 11/24/2019   Procedure: TRANSESOPHAGEAL ECHOCARDIOGRAM (TEE);  Surgeon: Alleen Borne, MD;  Location: [facility] OR;  Service: Open Heart Surgery;  Laterality: N/A;   TONSILLECTOMY  age 56   VASECTOMY     X 2   VASECTOMY REVERSAL     Patient Active Problem List   Diagnosis Date Noted   Symptomatic bradycardia 10/05/2023   Subarachnoid hemorrhage following injury (HCC) 08/26/2023   Skull fracture with cerebral contusion (HCC) 08/26/2023   Temporal bone fracture (HCC) 08/26/2023   Aortic atherosclerosis (HCC) 05/26/2023   Acute non-recurrent pansinusitis 01/04/2023   Perennial allergic rhinitis 07/09/2022   Asthma-COPD overlap syndrome (HCC) 04/21/2022   Recurrent infections 04/21/2022   S/P aortobifemoral bypass surgery 04/03/2021   S/P CABG x 1 11/24/2019   S/P aortic valve replacement with bioprosthetic valve 11/24/2019   PAD (peripheral artery disease) (HCC)    Mild persistent asthma 10/29/2019   RBBB (right bundle branch block with left anterior fascicular block)    Peripheral neuropathy    S/P lumbar spinal fusion 10/25/2014   Recovering alcoholic in remission (HCC) 08/10/2014   Substance induced mood disorder (HCC) 06/30/2014   Substance-induced sleep disorder (HCC)  06/30/2014   Obsessive compulsive disorder    OSA (obstructive sleep apnea) 01/20/2014   CAD (coronary artery disease)    Mentally disabled 11/25/2012   Hyperlipidemia with target LDL less than 130 11/25/2012   Bipolar disorder (HCC)    Hypertension    Allergic rhinitis due to pollen 08/11/2011    ONSET DATE: 08/28/2023, resume orders 10/13/2023 provided by Dr. Loman Brooklyn, MD  REFERRING DIAG: R55 (ICD-10-CM) - Syncope, unspecified syncope type I60.9 (ICD-10-CM) - Subarachnoid hemorrhage (HCC)  THERAPY DIAG:  Unsteadiness on feet  Other abnormalities of gait and mobility  Muscle weakness (generalized)  Dizziness and giddiness  Rationale for Evaluation and Treatment: Rehabilitation  SUBJECTIVE:  SUBJECTIVE STATEMENT: Patient reports doing well. Ambulating into clinic with RW. Still waiting on glasses. Continue to report reduced endurance and strength with walking. Denies falls.   Pt accompanied by:  Wife, Chales Abrahams   PERTINENT HISTORY:  pacemaker placement 09/29/2023, CABG, aortic valve replacement (2021)  PAIN:  Are you having pain? Yes: NPRS scale: 3-4/10 Pain location: L shoulder Pain description: achy Aggravating factors: Movement  Relieving factors: tylenol  PRECAUTIONS: Fall and Other: no bending forward (skull fx), cannot lift more than 10lbs until November 18, 2023   PATIENT GOALS: "To reduce my symptoms."  OBJECTIVE:   DIAGNOSTIC FINDINGS:   CT Head wo Contrast 09/08/2023 IMPRESSION: 1. Expected evolution of multicompartmental intracranial hemorrhage since 08/28/2023, with no residual hyperdense blood. 2. 3 mm thick hypodense right parietal convexity subdural collection is unchanged and may reflect a subdural hygroma or evolving subdural hematoma. 3. Unchanged right  temporal/parietal bone fracture.  CT Lumbar Spine wo Contrast 09/08/2023: IMPRESSION: 1. No acute fracture or traumatic malalignment of the lumbar spine. 2. Postsurgical changes reflecting posterior instrumented fusion and decompression at L5-S1 without residual spinal canal or neural foraminal stenosis. 3. Adjacent segment disease at L3-L4 resulting in at least moderate spinal canal stenosis and moderate bilateral neural foraminal stenosis.  CT of head from 08/27/23   IMPRESSION: 1. No significant interval change in bilateral frontal convexity subdural fluid collection measuring up to 6 mm. 2. Redemonstrated subarachnoid hemorrhage and hemorrhagic contusions along the bilateral anterior frontal lobes, slightly increased on the left measuring up 2.1 cm ( previously 1.9 cm), and unchanged along the right frontal lobe. Compared to prior exam there is interval increase in conspicuity of subarachnoid blood products along the bilateral parietal lobes, likely due to redistribution. No evidence of intraventricular extension. No hydrocephalus. 3. Redemonstrated acute nondisplaced fracture involving the right temporal bone.   VITALS Vitals:   10/29/23 1024  BP: (!) 145/73  Pulse: 61   TODAY'S TREATMENT:           Ther Act  Assessed vitals (see above) and within limits for therapy  Ther Ex  -Treadmill total 12 mins up to 2.27mph and 5% incline, no UE   -onset of antalgic- like gait pattern favoring L LE noted, but patient denies pain     PATIENT EDUCATION: Education details: continue HEP Person educated: Patient and Spouse Education method: Explanation, Demonstration, and Handouts Education comprehension: verbalized understanding, returned demonstration, and needs further education  HOME EXERCISE PROGRAM:  Access Code: [email or web address] URL: [email or web address] Date: 09/08/2023 Prepared by: Maryruth Eve  Exercises - Corner Balance Feet Together With Eyes Closed  -  1 x daily - 7 x weekly - 3 sets - 30 seconds hold - Pencil Pushups  - 1 x daily - 7 x weekly - 3 sets - 10 reps - Seated Horizontal Saccades  - 1 x daily - 7 x weekly - 3 sets - 10 reps - Seated Vertical Saccades  - 1 x daily - 7 x weekly - 3 sets - 10 reps -[place] eye tracking (1 paragraph)  -King Devick's Saccades 1x a day - Side Stepping with Resistance at Thighs and Counter Support  - 1 x daily - 7 x weekly - 3 sets - 10 reps - Forward Backward Monster Walk with Band at [employer] and Counter Support  - 1 x daily - 7 x weekly - 3 sets - 10 reps - Supine Bridge  - 1 x daily - 7 x weekly - 3 sets - 10 reps -  Clamshell  - 1 x daily - 7 x weekly - 3 sets - 10 reps - Squat with Chair Touch  - 1 x daily - 7 x weekly - 3 sets - 10 reps  Education on SPOT it game and HART chart use, VOR x 1 viewing 3 x 5 rounds Brock string ~3x a day with spouse guiding through   GOALS: Goals reviewed with patient? Yes  SHORT TERM GOALS: Target date: 09/29/2023   Pt will be independent with initial HEP for improved balance, transfers and gait.  Baseline: not established on eval; provided Goal status: MET  2.  Patient will improve RPQ-3 score to </= 2 per item to indicate reduced severity of post-concussive symptoms to progress towards PLOF.    Baseline: 3/4, 3/4 Goal status: NOT MET  3.  5x STS to be assessed and STG/LTG updated  Baseline: 15.65 seconds without UE use (SBA) Goal status: Discontinued STG due to how well performed   4.  Patient will improve mCTSIB score to 100/120 to indicate improved integration of vestibular, proprioceptive, and visual balance systems during static balance tasks in order to reduce risk for falls.   Baseline: 86/120; 120/120 Goal status: MET   LONG TERM GOALS: Target date: 10/27/2023   Pt will be independent with final HEP for improved balance, transfers and gait.  Baseline: Reports partial compliance  Goal status: IN PROGRESS   2.  Patient will improve RPQ-13  score to </= 2 per item to indicate reduced severity of post-concussive symptoms to progress towards PLOF.   Baseline: greatest deficit reported 4/4, greatest defciti 3/4 on fatige and feeling depressed Goal status: NOT MET BUT PROGRESSED   3.  Patient will improve their 5x Sit to Stand score to less than 13 seconds to demonstrate a decreased risk for falls and improved LE strength.   Baseline: 15.65 seconds without UE use (SBA); 9.2 seconds without UE use (SBA); improved to 9.2 seconds  Goal status: MET  4. Patient will improve mCTSIB score to 120/120 to indicate improved integration of vestibular, proprioceptive, and visual balance systems during static balance tasks in order to reduce risk for falls.   Baseline: 86/120; 120/120  Goal status: MET  LONG TERM GOALS UPDATED FOLLOWING RETURN TO CARE: Target date: 12/07/2023  Pt will be independent with final HEP for improved balance, transfers and gait. Baseline: Reports partial compliance  Goal status: IN PROGRESS   2.  Patient will improve RPQ-13 score to </= 2 per item to indicate reduced severity of post-concussive symptoms to progress towards PLOF.   Baseline: greatest deficit reported 4/4, greatest defciti 3/4 on fatige and feeling depressed Goal status: NOT MET BUT PROGRESSED   3.  SOT to be assessed and LTG updated  Baseline: To be assessed  Goal status: Discontinued at this time due to pacemaker incision placement and harness that needs to be donned with testing   ASSESSMENT:  CLINICAL IMPRESSION: Patient seen for skilled PT session with emphasis on posterior chain strengthening and endurance. With increasing speed, onset of slight trendelenburg gait pattern suggesting B hip weakness, but also L antalgic gait with patient denying pain. Prescribed posterior chain HEP within bounds of new pacemaker placement. Continue POC.   OBJECTIVE IMPAIRMENTS: Abnormal gait, decreased activity tolerance, decreased balance, decreased cognition,  decreased coordination, decreased endurance, decreased knowledge of condition, decreased knowledge of use of DME, decreased mobility, difficulty walking, decreased strength, decreased safety awareness, dizziness, impaired perceived functional ability, impaired vision/preception, and pain  ACTIVITY LIMITATIONS: carrying,  lifting, bending, sleeping, stairs, transfers, hygiene/grooming, locomotion level, and caring for others  PARTICIPATION LIMITATIONS: meal prep, cleaning, laundry, interpersonal relationship, driving, shopping, community activity, occupation, and yard work  PERSONAL FACTORS: Fitness, Past/current experiences, and 3+ comorbidities: Bradycardia, post-concussion w/LOC,   are also affecting patient's functional outcome.   REHAB POTENTIAL: Good  CLINICAL DECISION MAKING: Unstable/unpredictable  EVALUATION COMPLEXITY: High  PLAN:  PT FREQUENCY: 2x/week  PT DURATION: 4 additional weeks since last seen for therapy   PLANNED INTERVENTIONS: Therapeutic exercises, Therapeutic activity, Neuromuscular re-education, Balance training, Gait training, Patient/Family education, Self Care, Joint mobilization, Stair training, Vestibular training, Canalith repositioning, DME instructions, Aquatic Therapy, Dry Needling, Electrical stimulation, Manual therapy, and Re-evaluation  PLAN FOR NEXT SESSION: progress occulomotor/vestibular exercises (VORx1 as tolerated, Brock string), progress corner balance (EC on foam, tandem with head turns as able etc), consider neuropthomology referral pending progress; did OT/speech get scheduled, HART chart with balance work, work on Building surveyor with balance training or dynamic gait/balance without AD with visual scanning, midline orientation,   Continue to work on gait without AD and progress higher level balance tasks as able. Pt would like to work on functional hip/LE strength    Westley Foots, PT Westley Foots, PT, DPT, CBIS 10/29/2023, 11:17  AM

## 2023-10-29 NOTE — Therapy (Signed)
OUTPATIENT OCCUPATIONAL THERAPY TREATMENT  Patient Name: Gregory [name] MRN: [id number] DOB:May 20, 1951, 72 y.o., male Today's Date: 10/29/2023  PCP: Ronnald Nian, MD REFERRING PROVIDER: Ronnald Nian, MD  END OF SESSION:  OT End of Session - 10/29/23 1113     Visit Number 5    Number of Visits 13    Date for OT Re-Evaluation 11/13/23    Authorization Type [employer]    Progress Note Due on Visit 10    OT Start Time [phone]    OT Stop Time 1016    OT Time Calculation (min) 39 min    Activity Tolerance Patient tolerated treatment well    Behavior During Therapy [facility] for tasks assessed/performed                Past Medical History:  Diagnosis Date   Alcohol abuse    Aortic stenosis    Arthritis    Asthma    exercise indused or pollen-uses inhaler dail yand has rescue inhaler if needed   Bipolar disorder (HCC)    CAD (coronary artery disease)    coronary calcifications 08/2013 CTA   Carotid artery occlusion    Cataract    bilateral sx   Chronic back pain    Claudication (HCC)    Complication of anesthesia 10/16/2020   "had a panic attack when the mask was put on me"" I do not think I was given anything  before, I need something to calm me down"   Depression    Family history of skin cancer    Heart murmur    as an infant   Hepatitis    h/o 1970,doesn't remember which type,1990 epstain-barr   Herpes zoster without complication 09/05/2019   Hyperlipidemia    Hypertension    Neuromuscular disorder (HCC)    Obsessive compulsive disorder    PAD (peripheral artery disease) (HCC)    Peripheral neuropathy    Pneumonia    RBBB    RBBB (right bundle branch block with left anterior fascicular block)    Seasonal allergies    Severe aortic stenosis    Sleep apnea    does not wear CPAP   Smoker    Spinal stenosis at L4-L5 level    Tobacco abuse    Tobacco use disorder    Past Surgical History:  Procedure Laterality Date   ANAL FISTULECTOMY  09/25/2011    AORTA - BILATERAL FEMORAL ARTERY BYPASS GRAFT N/A 10/03/2020   Procedure: AORTA BIFEMORAL BYPASS GRAFT USING A HEMASHIELD GOLD BIFURCATED 14 X 7mm GRAFT AND INFERIOR MESENTERIC ARTERY REIMPLANTATION;  Surgeon: Nada Libman, MD;  Location: [facility] OR;  Service: Vascular;  Laterality: N/A;   AORTIC VALVE REPLACEMENT N/A 11/24/2019   Procedure: AORTIC VALVE REPLACEMENT (AVR) using INSPIRIS Resilia 23 MM Bioprosthetic Aortic Valve.;  Surgeon: Alleen Borne, MD;  Location: [facility] OR;  Service: Open Heart Surgery;  Laterality: N/A;   APPLICATION OF WOUND VAC Left 10/17/2020   Procedure: APPLICATION OF WOUND VAC;  Surgeon: Nada Libman, MD;  Location: [facility] OR;  Service: Vascular;  Laterality: Left;   BACK SURGERY     2015   CATARACT EXTRACTION, BILATERAL  2022   COLONOSCOPY  2009   MS-F/V-mov(exc)-HPP   CORONARY ARTERY BYPASS GRAFT N/A 11/24/2019   Procedure: CORONARY ARTERY BYPASS GRAFTING (CABG) using LIMA to LAD.;  Surgeon: Alleen Borne, MD;  Location: [facility] OR;  Service: Open Heart Surgery;  Laterality: N/A;   ELBOW SURGERY  bilaterally for cubital tunnel   ENDARTERECTOMY N/A 10/03/2020   Procedure: AORTIC ENDARTERECTOMY;  Surgeon: Nada Libman, MD;  [facility]: [facility] OR;  Service: Vascular;  Laterality: N/A;   ENDARTERECTOMY FEMORAL Bilateral 10/03/2020   Procedure: BILATERAL FEMORAL ENDARTERECTOMY;  Surgeon: Nada Libman, MD;  [facility];  Service: Vascular;  Laterality: Bilateral;   HERNIA REPAIR     with mesh   INCISION AND DRAINAGE Left 01/02/2021   Procedure: INCISION AND DRAINAGE LEFT GROIN WITH STIMULAN BEADS;  Surgeon: Nada Libman, MD;  Location: [facility];  Service: Vascular;  Laterality: Left;   INCISION AND DRAINAGE OF WOUND Left 10/17/2020   Procedure: EXPLORATION LEFT GROIN WOUND;  Surgeon: Nada Libman, MD;  [facility];  Service: Vascular;  Laterality: Left;   MAXIMUM ACCESS (MAS)POSTERIOR LUMBAR INTERBODY FUSION (PLIF) 1 LEVEL N/A 10/25/2014   Procedure:  LUMBAR FOUR TO FIVE MAXIMUM ACCESS (MAS) POSTERIOR LUMBAR INTERBODY FUSION (PLIF) 1 LEVEL;  Surgeon: Tia Alert, MD;  Location: [facility] NEURO ORS;  Service: Neurosurgery;  Laterality: N/A;   PACEMAKER IMPLANT N/A 10/06/2023   Procedure: PACEMAKER IMPLANT;  Surgeon: Regan Lemming, MD;  [facility]: [facility] INVASIVE CV LAB;  Service: Cardiovascular;  Laterality: N/A;   RIGHT/LEFT HEART CATH AND CORONARY ANGIOGRAPHY N/A 11/10/2019   Procedure: RIGHT/LEFT HEART CATH AND CORONARY ANGIOGRAPHY;  Surgeon: Swaziland, [name], MD;  [facility] INVASIVE CV LAB;  Service: Cardiovascular;  Laterality: N/A;   SKIN BIOPSY Left 10/12/2018   shave forehead Hypertrophic actinic kertosis with features of a verruca   TEE WITHOUT CARDIOVERSION N/A 11/24/2019   Procedure: TRANSESOPHAGEAL ECHOCARDIOGRAM (TEE);  Surgeon: Alleen Borne, MD;  Location: [facility] OR;  Service: Open Heart Surgery;  Laterality: N/A;   TONSILLECTOMY  age 52   VASECTOMY     X 2   VASECTOMY REVERSAL     Patient Active Problem List   Diagnosis Date Noted   Symptomatic bradycardia 10/05/2023   Subarachnoid hemorrhage following injury (HCC) 08/26/2023   Skull fracture with cerebral contusion (HCC) 08/26/2023   Temporal bone fracture (HCC) 08/26/2023   Aortic atherosclerosis (HCC) 05/26/2023   Acute non-recurrent pansinusitis 01/04/2023   Perennial allergic rhinitis 07/09/2022   Asthma-COPD overlap syndrome (HCC) 04/21/2022   Recurrent infections 04/21/2022   S/P aortobifemoral bypass surgery 04/03/2021   S/P CABG x 1 11/24/2019   S/P aortic valve replacement with bioprosthetic valve 11/24/2019   PAD (peripheral artery disease) (HCC)    Mild persistent asthma 10/29/2019   RBBB (right bundle branch block with left anterior fascicular block)    Peripheral neuropathy    S/P lumbar spinal fusion 10/25/2014   Recovering alcoholic in remission (HCC) 08/10/2014   Substance induced mood disorder (HCC) 06/30/2014   Substance-induced sleep disorder (HCC)  06/30/2014   Obsessive compulsive disorder    OSA (obstructive sleep apnea) 01/20/2014   CAD (coronary artery disease)    Mentally disabled 11/25/2012   Hyperlipidemia with target LDL less than 130 11/25/2012   Bipolar disorder (HCC)    Hypertension    Allergic rhinitis due to pollen 08/11/2011    ONSET DATE: 09/15/2023 (referral date)   REFERRING DIAG: S06.9X1D (ICD-10-CM) - Traumatic brain injury, with loss of consciousness of 30 minutes or less, subsequent encounter R41.3 (ICD-10-CM) - Memory deficit  THERAPY DIAG:  Unsteadiness on feet  Muscle weakness (generalized)  Visuospatial deficit  Other symptoms and signs involving cognitive functions following nontraumatic subarachnoid hemorrhage  Rationale for Evaluation and Treatment: Rehabilitation  SUBJECTIVE:   SUBJECTIVE  STATEMENT: Pt reports things are going better and no longer using walker in house per PT visit yesterday. Pt reports continuing to look for walker at home d/t established habit even though he no longer requires using walker in the home. Pt reports improving peripheral visual with some return of peripheral vision. Pt's spouse reports pt's functioning is "night and day difference" compared to before. Pt reported continuing to complete vision HEP.  Pt accompanied by:  Spouse  PERTINENT HISTORY: presenting after syncopal event and fall on 08/25/23. Imaging showed acute subarachnoid hemorrhage with 1.8 cm inferior left frontal lobe hemorrhagic contusion without substantial mass effect and nondisplaced fracture of the right temporoparietal calvarium. PMH: CABG, aortic valve replacement, HTN, HLD, bipolar, bradycardia  Update - Pacemaker implant on 10/06/23  PRECAUTIONS: Other: no driving, fall risk, syncope, pacemaker implant 10/06/23  10/16/23 update - Pt and pt's spouse reported new precautions d/t pacemaker implant s/p 10/06/23: LUE - no lift/pull/push > 8-10 lbs.   10/22/23 update - Pt reports lifting limit  precautions will be stopped on 11/18/23. Pt reports he is in process for return to work and has been approved for limited exercise.  WEIGHT BEARING RESTRICTIONS: No  PAIN:  Are you having pain? Pt reports 1/10 pain at L arm (d/t "tendonitis")  FALLS: Has patient fallen in last 6 months? Yes. 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  HAND DOMINANCE: Left  ADLs: Eating: assist to cut food occasionally Grooming: independent/ UB Dressing: independ ent LB Dressing: independent Toileting: independent (raised) Bathing: mod I - standing some, seated some Tub Shower transfers: mod I  Equipment: Shower seat with back  IADLs: Shopping: pt goes with wife (pt used to do) Light housekeeping: wife always does majority, but pt has not returned to full laundry tasks Meal Prep: wife doing now for safety reasons, but both pt and wife took turns prior to injury Community mobility: pt currently not driving since injury Medication management: independent Financial management: wife has always done  Handwriting:  pt reports always been horrible - no changes.   MOBILITY STATUS:  uses walker   ACTIVITY TOLERANCE: Activity tolerance: fatigues quicker   UPPER EXTREMITY ROM:  BUE AROM WFL's - pt has flexed ring and small fingers Lt hand from old injury but can fully open for short amounts of time   UPPER EXTREMITY MMT:   RUE 5/5 grossly, LUE 4+/5 grossly at shoulders   HAND FUNCTION: Grip strength: Right: 57.5 lbs; Left: 61.9 lbs  COORDINATION: 9 Hole Peg test: Right: 36.06 sec; Left: 35.92 sec (pt denies change and reports  coordination bilaterally has been slightly slower for some time)   SENSATION: WFL Per pt report  EDEMA: none   COGNITION: Overall cognitive status:  difficult to assess, ? Safety awareness and executive functioning - see speech evaluation for details  VISION: Subjective report: not sure about visual changes, denies diplopia but wife reports issues with saccades and P.T. gave ex's for this and convergence Baseline vision: Wears glasses for reading only Visual history: corrective eye surgery for cataracts  VISION ASSESSMENT: TBA - d/t time constraints during evaluation  10/02/23 update - Pt accurately read clock  on wall. Pt wears reading glasses, pt reports hx of cataract surgery, and pt reports need to update prescription d/t difficulty seeing objects far away. Pt and pt's spouse reported changes with vision throughout the 3 weeks after the fall event, such as paint chips on wall appearing as "floating" and moving through pt's vision. However, pt reports "floaters" have mostly resolved and has not noticed floaters recently.  Ocular movements when tracking - Mild atypical movements of both eyes while tracking a marker in all directions and more significant "jumping" of eye movements intermittently. Pt's spouse reports eye movements "saccades" are greatly improved compared to immediately after the fall.  Peripheral vision approx. 50* on both R and L. Superior vision WFL.  Letter cancellation - Pt correctly identified 100% of letters in 4 out of 4 lines for identifying letters uppercase B, D, E, and F.    PERCEPTION: Not tested  PRAXIS: Not tested  OBSERVATIONS: very verbose, occasional redirection needed, ? Safety awareness.    TODAY'S TREATMENT:                                                                                                                               Therapeutic activities Blaze pods: OT placed 6 BlazePods in front of patient at tabletop with pt standing, and  had patient tap pods with BUE palm of hand and isolated middle finger bimanually as pods lit up using OT visual scanning and distraction mode for improved processing, visual scanning and locating of items, reaction time, upper extremity range of motion, gross motor coordination, fine motor coordination, dynamic standing balance and bimanual coordination/trunk control.  With RUE palm, pt demo'd 82 hits, 1.28 s reaction time.  With LUE palm, pt demo'd 99 hits with 1.01 s reaction time and 1 error.  With RUE middle finger, pt demo'd 58 hits with 1.8 reaction time and 1 error.  With LUE middle finger, pt demo'd 82 hits with 1.28 reaction time.   Pt completed task with middle finger per pt request d/t pt reporting numbness of R pointer fingertip. Pt demo'd decreased reaction time and increased number of hits with dominant LUE. Pt demo'd some hesitation with RUE middle finger possibly d/t processing multiple steps of isolating non-dominant RUE then visually scanning for pod. Pt demo'd excellent visual scanning to both right and Left side without v/c. Pt benefited from v/c to avoiding leaning on L arm during task to attend to LUE pacemaker precautions and to improve focus on standing dynamic balance.  Self care Simulated kitchen task of making breakfast - standing/walking without walker d/t pt now completing kitchen mobility without walker at home - to improve ind for simple cooking tasks, to improve dynamic standing balance for functional tasks, to improve functional reach. Pt demo'd excellent standing tolerance, functional reach, dynamic standing balance, and attention to multi-step processing/sequencing, and multi-tasking to complete simulated breakfast task. Pt and pt's spouse expressed no remaining concerns about pt completing kitchen tasks.  Pt's spouse reported pt is continuing to avoid using sharp knives for safety and pt's spouse retrieves some items from high shelves for pt. However, per pt and pt's  spouse, pt is completing many cooking tasks ind, including using oven, dishwasher, rinsing dishes, retrieving plates from cabinet with R hand.  PATIENT EDUCATION: Education details: review of pacemaker precautions during functional tasks, kitchen safety, walker in community, not using walker at home per most recent PT session instructions, pt's goals Person educated: Patient and Spouse Education method: Explanation, Demonstration, and Verbal cues Education comprehension: verbalized understanding, returned demonstration, verbal cues required, and needs further education  HOME EXERCISE PROGRAM: Vision HEP (see pt instructions) 10/22/23 - Locate colored objects in environment with visual scanning (e.g. find all the green objects)   GOALS: Goals reviewed with patient? Yes  SHORT TERM GOALS: Target date: 10/21/23  Pt to be independent with HEP for overall strength/endurance UEs Baseline: Goal status: 10/22/23 - MET 10/22/23 - Pt reports completing HEP vision exercises a minimum of 3 times per day and increasing reps as tolerated. Pt demo'd understanding of exercises, x1 v/c to track marker tip fully to left side.  2.  Pt to return to standing for minimum of 10 minutes to perform light IADL's (folding clothes, washing dishes)  Baseline:  10/22/23 - Pt tolerated standing/walking for 8 to 13 minutes today without difficulty to complete functional tasks. Goal status: 10/22/23 - MET  3.  Pt to verbalize understanding with safety considerations for fall prevention during IADLS including appropriate use of walker for kitchen negotiation Baseline:  10/22/23 - Pt verbalized understanding of walker safety and using alternate stable surfaces (countertops) in kitchen for kitchen navigation. Pt and pt's spouse reported pt is demo'ing more consistent use of walker. Goal status:  10/22/23 - MET    LONG TERM GOALS: Target date: 11/13/23  Pt to perform environmental scanning in moderately distracting  gym with 85% accuracy or greater in prep for potential return to driving Baseline:  Goal status: INITIAL  2.  Pt to demo safety w/ simple cooking task with direct supervision but no physical assist Baseline:  Goal status: INITIAL  3.  10/02/23 Update following additional vision assessment -  Patient will demonstrate at least 85% accuracy with environmental visual scanning to assist with ADLs and IADLS including potential driving considerations.   Baseline:  Goal status: INITIAL   ASSESSMENT:  CLINICAL IMPRESSION: Pt demo'ing improved visual scanning, standing tolerance, dynamic balance, and participation in functional tasks. Pt tolerated tasks very well. Pt would benefit from skilled OT services in the outpatient setting to work on impairments as noted below to help pt return to PLOF as able.    PERFORMANCE DEFICITS: in functional skills including IADLs, strength, Fine motor control, mobility, balance, endurance, cardiopulmonary status limiting function, decreased knowledge of precautions, decreased knowledge of use of DME, vision, and UE functional use, cognitive skills including problem solving and safety awareness, and psychosocial skills including coping strategies.   IMPAIRMENTS: are limiting patient from IADLs, work, and leisure.   CO-MORBIDITIES: may have co-morbidities  that affects occupational performance. Patient will benefit from skilled OT to address above impairments and improve overall function.  MODIFICATION OR ASSISTANCE TO COMPLETE EVALUATION: No modification of tasks or assist necessary to complete an evaluation.  OT OCCUPATIONAL PROFILE AND HISTORY: Problem focused assessment: Including review of records relating to presenting problem.  CLINICAL DECISION MAKING: Moderate - several treatment options, min-mod task modification necessary  REHAB POTENTIAL: Good  EVALUATION COMPLEXITY: Low  PLAN:  OT FREQUENCY: 2x/week  OT DURATION: 6 weeks, plus  evaluation  PLANNED INTERVENTIONS: self care/ADL training, therapeutic exercise, therapeutic activity, neuromuscular re-education, manual therapy, passive range of motion, functional mobility training, splinting, patient/family education, cognitive remediation/compensation, visual/perceptual remediation/compensation, coping strategies training, DME and/or AE instructions, and Re-evaluation  RECOMMENDED OTHER SERVICES: none at this time  CONSULTED AND AGREED WITH PLAN OF CARE: Patient and spouse  PLAN FOR NEXT SESSION:   Re-assess progress towards LTG, discuss potential D/C if pt and pt's spouse agreeable Visual scanning tasks Update HEP PRN   Wynetta Emery, OT 10/29/2023, 11:29 AM

## 2023-10-30 ENCOUNTER — Other Ambulatory Visit: Payer: Self-pay

## 2023-10-30 NOTE — Patient Outreach (Signed)
Care Management  Transitions of Care Program Transitions of Care Post-discharge week 4   10/30/2023 Name: Gregory [name] MRN: [id number] DOB: [birth year]  Subjective: Gregory [name] is a 72 y.o. year old male who is a primary care patient of Ronnald Nian, MD. The Care Management team Engaged with patient Engaged with patient by telephone to assess and address transitions of care needs.   Consent to Services:  Patient was given information about care management services, agreed to services, and gave verbal consent to participate.   Assessment:   Patient voices no new complaints Patient has not developed/ reported any new Medical issues / Dx or acute changes.- since last follow-up call for most recent  Hospital stay      10/14-10/16/ 2024  He is doing great s/p Pacemaker placement.He continues to receive PT/OT and SLP with [facility]. Walking on the Treadmill,, able to amb, and talk with O2 Sats 97-99%. He continues to go the gym, enjoys prayer group,. He uses a pulse ox to monitor his HR .  Seen by Neurosurgeon , was cleared to return to work when he is ready . 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          SDOH Interventions    Flowsheet Row Patient Outreach from 10/23/2023 in [place] POPULATION HEALTH DEPARTMENT Telephone from 10/08/2023 in [place] POPULATION HEALTH DEPARTMENT Clinical Support from 04/14/2023 in [facility] Care Coordination from 02/12/2023 in [facility] Clinical Support from 04/11/2022 in [facility] Chronic Care Management from 11/07/2021 in [facility]  SDOH Interventions        Food Insecurity  Interventions Intervention Not Indicated -- Intervention Not Indicated Intervention Not Indicated -- --  Housing Interventions Intervention Not Indicated -- Intervention Not Indicated Intervention Not Indicated -- --  Transportation Interventions Intervention Not Indicated, Patient Resources Dietitian) -- Intervention Not Indicated Intervention Not Indicated -- Intervention Not Indicated  Utilities Interventions Intervention Not Indicated -- Intervention Not Indicated Intervention Not Indicated -- --  Alcohol Usage Interventions -- -- Intervention Not Indicated (Score <7) Intervention Not Indicated (Score <7) -- --  Depression Interventions/Treatment  -- -- YQM5-7 Score <4 Follow-up Not Indicated -- PHQ2-9 Score <4 Follow-up Not Indicated --  Financial Strain Interventions -- -- Intervention Not Indicated Intervention Not Indicated -- Intervention Not Indicated  Physical Activity Interventions -- -- Intervention Not Indicated Intervention Not Indicated -- --  Stress Interventions -- -- Patient Refused -- -- --  Social Connections Interventions -- -- Intervention Not Indicated -- -- --  Health Literacy Interventions -- Intervention Not Indicated -- -- -- --        Goals Addressed             This Visit's Progress    TOC Care Plan       Current Barriers:  Medication management No missed medications, will report any adverse side effects Provider appointments Cardiology, Neurology- and outpatient PT/OT SLP  RNCM Clinical Goal(s):  Patient will verbalize understanding of plan for management of Atrial Fibrillation as evidenced by heart rate will remain between 60-100- occ tachy 108 Pacemaker check rates  59-120   take all medications exactly as prescribed and will call provider for medication related questions as evidenced by no unreported adverse side effect or unmanaged symptoms  attend all scheduled medical appointments: Cardiology, Neurology and PCP  as evidenced by no missed  appointments , labs or testing   through collaboration with RN Care manager, provider, and care team.   Interventions: Evaluation of current treatment plan related to  self management and patient's adherence to plan as established by provider  Transitions of Care:  Goal on track:  Yes. Doctor Visits  - discussed the importance of doctor visits- cardio 10/28, then 11/18 Wound check s/p PPM implant 10/30- area healed .  Cardiologist cleared to go  back to work but waiting til Thanksgiving, going to gym, Will get Flu vaccine  Patient Goals/Self-Care Activities: Participate in Transition of Care Program/Attend TOC scheduled calls Take all medications as prescribed Attend all scheduled provider appointments Call pharmacy for medication refills 3-7 days in advance of running out of medications Perform all self care activities independently  Perform IADL's (shopping, preparing meals, housekeeping, managing finances) independently Call provider office for new concerns or questions   Follow Up Plan:  Telephone follow up appointment with care management team member scheduled for:  11/05/23 @ 10:30am The patient has been provided with contact information for the care management team and has been advised to call with any health related questions or concerns.          Plan: The patient has been provided with contact information for the care management team and has been advised to call with any health related questions or concerns.   [name] , BSN, RN Care Management Coordinator [place]   [facility] [email or web address] Direct Dial: [phone]

## 2023-11-02 ENCOUNTER — Ambulatory Visit: Payer: PPO

## 2023-11-02 ENCOUNTER — Encounter: Payer: PPO | Admitting: Speech Pathology

## 2023-11-02 ENCOUNTER — Ambulatory Visit: Payer: PPO | Admitting: Occupational Therapy

## 2023-11-02 VITALS — BP 137/66 | HR 62

## 2023-11-02 DIAGNOSIS — M6281 Muscle weakness (generalized): Secondary | ICD-10-CM

## 2023-11-02 DIAGNOSIS — I69018 Other symptoms and signs involving cognitive functions following nontraumatic subarachnoid hemorrhage: Secondary | ICD-10-CM

## 2023-11-02 DIAGNOSIS — R2681 Unsteadiness on feet: Secondary | ICD-10-CM

## 2023-11-02 DIAGNOSIS — R42 Dizziness and giddiness: Secondary | ICD-10-CM

## 2023-11-02 DIAGNOSIS — R41842 Visuospatial deficit: Secondary | ICD-10-CM

## 2023-11-02 DIAGNOSIS — R2689 Other abnormalities of gait and mobility: Secondary | ICD-10-CM

## 2023-11-02 DIAGNOSIS — R41841 Cognitive communication deficit: Secondary | ICD-10-CM

## 2023-11-02 NOTE — Therapy (Signed)
OUTPATIENT SPEECH LANGUAGE PATHOLOGY TREATMENT   Patient Name: Gregory [name] MRN: [id number] DOB:Mar 29, 1951, 72 y.o., male Today's Date: 11/02/2023  PCP: Ronnald Nian, MD REFERRING PROVIDER: Ronnald Nian, MD  END OF SESSION:  End of Session - 11/02/23 0849     Visit Number 5    Number of Visits 18    Date for SLP Re-Evaluation 11/25/23    SLP Start Time 0850    SLP Stop Time  0935    SLP Time Calculation (min) 45 min    Activity Tolerance Patient tolerated treatment well                Past Medical History:  Diagnosis Date   Alcohol abuse    Aortic stenosis    Arthritis    Asthma    exercise indused or pollen-uses inhaler dail yand has rescue inhaler if needed   Bipolar disorder (HCC)    CAD (coronary artery disease)    coronary calcifications 08/2013 CTA   Carotid artery occlusion    Cataract    bilateral sx   Chronic back pain    Claudication (HCC)    Complication of anesthesia 10/16/2020   "had a panic attack when the mask was put on me"" I do not think I was given anything  before, I need something to calm me down"   Depression    Family history of skin cancer    Heart murmur    as an infant   Hepatitis    h/o 1970,doesn't remember which type,1990 epstain-barr   Herpes zoster without complication 09/05/2019   Hyperlipidemia    Hypertension    Neuromuscular disorder (HCC)    Obsessive compulsive disorder    PAD (peripheral artery disease) (HCC)    Peripheral neuropathy    Pneumonia    RBBB    RBBB (right bundle branch block with left anterior fascicular block)    Seasonal allergies    Severe aortic stenosis    Sleep apnea    does not wear CPAP   Smoker    Spinal stenosis at L4-L5 level    Tobacco abuse    Tobacco use disorder    Past Surgical History:  Procedure Laterality Date   ANAL FISTULECTOMY  09/25/2011   AORTA - BILATERAL FEMORAL ARTERY BYPASS GRAFT N/A 10/03/2020   Procedure: AORTA BIFEMORAL BYPASS GRAFT USING A  HEMASHIELD GOLD BIFURCATED 14 X 7mm GRAFT AND INFERIOR MESENTERIC ARTERY REIMPLANTATION;  Surgeon: Nada Libman, MD;  Location: [facility] OR;  Service: Vascular;  Laterality: N/A;   AORTIC VALVE REPLACEMENT N/A 11/24/2019   Procedure: AORTIC VALVE REPLACEMENT (AVR) using INSPIRIS Resilia 23 MM Bioprosthetic Aortic Valve.;  Surgeon: Alleen Borne, MD;  Location: [facility] OR;  Service: Open Heart Surgery;  Laterality: N/A;   APPLICATION OF WOUND VAC Left 10/17/2020   Procedure: APPLICATION OF WOUND VAC;  Surgeon: Nada Libman, MD;  Location: [facility] OR;  Service: Vascular;  Laterality: Left;   BACK SURGERY     2015   CATARACT EXTRACTION, BILATERAL  2022   COLONOSCOPY  2009   MS-F/V-mov(exc)-HPP   CORONARY ARTERY BYPASS GRAFT N/A 11/24/2019   Procedure: CORONARY ARTERY BYPASS GRAFTING (CABG) using LIMA to LAD.;  Surgeon: Alleen Borne, MD;  Location: [facility] OR;  Service: Open Heart Surgery;  Laterality: N/A;   ELBOW SURGERY     bilaterally for cubital tunnel   ENDARTERECTOMY N/A 10/03/2020   Procedure: AORTIC ENDARTERECTOMY;  Surgeon: Nada Libman, MD;  Location: [facility]  OR;  Service: Vascular;  Laterality: N/A;   ENDARTERECTOMY FEMORAL Bilateral 10/03/2020   Procedure: BILATERAL FEMORAL ENDARTERECTOMY;  Surgeon: Nada Libman, MD;  Location: [facility] OR;  Service: Vascular;  Laterality: Bilateral;   HERNIA REPAIR     with mesh   INCISION AND DRAINAGE Left 01/02/2021   Procedure: INCISION AND DRAINAGE LEFT GROIN WITH STIMULAN BEADS;  Surgeon: Nada Libman, MD;  Location: [facility] OR;  Service: Vascular;  Laterality: Left;   INCISION AND DRAINAGE OF WOUND Left 10/17/2020   Procedure: EXPLORATION LEFT GROIN WOUND;  Surgeon: Nada Libman, MD;  Location: [facility] OR;  Service: Vascular;  Laterality: Left;   MAXIMUM ACCESS (MAS)POSTERIOR LUMBAR INTERBODY FUSION (PLIF) 1 LEVEL N/A 10/25/2014   Procedure: LUMBAR FOUR TO FIVE MAXIMUM ACCESS (MAS) POSTERIOR LUMBAR INTERBODY FUSION (PLIF) 1 LEVEL;  Surgeon: Tia Alert,  MD;  Location: [facility] NEURO OR[facility];  Service: Neurosurgery;  Laterality: N/A;   PACEMAKER IMPLANT N/A 10/06/2023   Procedure: PACEMAKER IMPLANT;  Surgeon: Regan Lemming, MD;  Location: [facility] INVASIVE CV LAB;  Service: Cardiovascular;  Laterality: N/A;   RIGHT/LEFT HEART CATH AND CORONARY ANGIOGRAPHY N/A 11/10/2019   Procedure: RIGHT/LEFT HEART CATH AND CORONARY ANGIOGRAPHY;  Surgeon: Swaziland, [name], MD;  Location: [facility] INVASIVE CV LAB;  Service: Cardiovascular;  Laterality: N/A;   SKIN BIOPSY Left 10/12/2018   shave forehead Hypertrophic actinic kertosis with features of a verruca   TEE WITHOUT CARDIOVERSION N/A 11/24/2019   Procedure: TRANSESOPHAGEAL ECHOCARDIOGRAM (TEE);  Surgeon: Alleen Borne, MD;  Location: [facility] OR;  Service: Open Heart Surgery;  Laterality: N/A;   TONSILLECTOMY  age 40   VASECTOMY     X 2   VASECTOMY REVERSAL     Patient Active Problem List   Diagnosis Date Noted   Symptomatic bradycardia 10/05/2023   Subarachnoid hemorrhage following injury (HCC) 08/26/2023   Skull fracture with cerebral contusion (HCC) 08/26/2023   Temporal bone fracture (HCC) 08/26/2023   Aortic atherosclerosis (HCC) 05/26/2023   Acute non-recurrent pansinusitis 01/04/2023   Perennial allergic rhinitis 07/09/2022   Asthma-COPD overlap syndrome (HCC) 04/21/2022   Recurrent infections 04/21/2022   S/P aortobifemoral bypass surgery 04/03/2021   S/P CABG x 1 11/24/2019   S/P aortic valve replacement with bioprosthetic valve 11/24/2019   PAD (peripheral artery disease) (HCC)    Mild persistent asthma 10/29/2019   RBBB (right bundle branch block with left anterior fascicular block)    Peripheral neuropathy    S/P lumbar spinal fusion 10/25/2014   Recovering alcoholic in remission (HCC) 08/10/2014   Substance induced mood disorder (HCC) 06/30/2014   Substance-induced sleep disorder (HCC) 06/30/2014   Obsessive compulsive disorder    OSA (obstructive sleep apnea) 01/20/2014   CAD (coronary artery  disease)    Mentally disabled 11/25/2012   Hyperlipidemia with target LDL less than 130 11/25/2012   Bipolar disorder (HCC)    Hypertension    Allergic rhinitis due to pollen 08/11/2011    ONSET DATE: 08/25/23   REFERRING DIAG: I34.9X1D (ICD-10-CM) - Traumatic brain injury, with loss of consciousness of 30 minutes or less, subsequent encounter R41.3 (ICD-10-CM) - Memory deficit  THERAPY DIAG: Cognitive communication deficit  Rationale for Evaluation and Treatment: Rehabilitation  SUBJECTIVE:   SUBJECTIVE STATEMENT: Reported desire to return to work as soon as medically cleared.  Pt accompanied by: significant other Chales Abrahams  PERTINENT HISTORY: Pt reports long history of cardiac issues/dizziness, but has never lost consciousness. Pt reports he felt dizzy and was reaching up at  an aisle in [employer]. Lost consciousness and fell backwards onto the concrete floor and his his head. Does not know how long he was unconscious, was found by another shopper. Has a history of bradycardia, wore a heart monitor for 2 weeks and was cleared by his cardiologist in August to exercise prior to this incident. Works as a Investment banker, operational at [employer] and is used to weightlifting/cardio several times per week.   PAIN:  Are you having pain? No  FALLS: Has patient fallen in last 6 months?  See PT evaluation for details  LIVING ENVIRONMENT: Lives with: lives with their spouse Lives in: House/apartment  PLOF:  Level of assistance: Independent with ADLs, Independent with IADLs Employment: Part-time employment  PATIENT GOALS: "To go back to work and drive"  OBJECTIVE:   TODAY'S TREATMENT:                                                                                                                                         11/02/23: Discussed pt desire to return to work and the cognitive skills required for successful return. Current medical recommendation indicating return in ~4 weeks but pt would like to  return sooner if possible pending medical clearance. Targeted meta-analysis of work tasks, cognitive sequencing for select tasks, and possible modifications needed to optimize success. Adequate safety awareness noted when typical culinary safety concerns discussed. Able to ID accommodation x3 to account for some physical limitations (I.e., weight bearing restrictions) and novel event planning (ex: using visual supports) but exhibited reduced anticipatory awareness of overall significant increase in physical load (I.e., standing for 6 hours) when wife detailed her concerns. Continue to target emergent/anticipatory awareness, safety judgement, and task completion in upcoming sessions if applicable.   10/28/23: Pt reports overall cognitive improvement with home-based tasks. He accurately recalled and implemented trained techniques discussed last session. Endorsed attention challenges completing entirety of task before moving on to the next (ex: putting items used to make lunch away). SLP A pt in problem-solving strategies to remind himself to return to task with min prompting. Pt questioned activities to increase cognitive functioning. SLP provided handout and educated pt on the "mind diet" and activities to promote brain health. Wife reports concern for pt's desire to return to work due to current cognitive skills. Plan to address next session.   10/21/23: Endorsed feeling frustrated with management of fluctuating schedule, numerous appointments, and health journey in general. Demonstrated ability to re-frame and reduce frustration re: therapeutic process and recovery expectations, which has aided mental clarity. Identified he was previously successful when structure and rigid framework in place (ex: work) versus daily schedule differences. Recommended using prior strengths to modify/compensate for current cognitive challenges, such as using visual aids and verbal sequencing to aid retention and attention. Endorsed  some difficulty mult-taking at home, resulting in deviations in attention and forgetting to use walker. Targeted meta-cognitive analysis and cognitive  strategy selection with pt able to ID appropriate solution with min prompting. Pt able demo and carryover targeted solution (enclosing self with walker) with rare min A in stimulating environment. Reported overall good carryover of targeted recommendations from last session, which aided safety at home.    10/14/23: Pt experienced medical event since prior evaluation session and had a pacemaker implanted. Endorses use of walker and limited challenges, although wife reports otherwise. Wife stated that pt is not using walker consistently, such as walking by it when leaving couch, leaving it in one kitchen door and exiting out the other, and not bringing it to the bathroom. Pt agreed with wife's statements and commented "out of sight out of mind." SLP led pt and wife in generating functional solutions, with wife stating she can place chair in second kitchen doorway as a visual cue to use the other one (where walker is placed). Additionally SLP recommended keeping walker within a couple feet at all times, such as when watching tv on the couch. Pt reports returning to simple cooking tasks with wife assisting with cutting, as pt cannot use knife. Demonstrated anticipatory awareness skills when noting that 3-minutes was too long for toast (it burned) and that he would set the timer for less next time.   09/30/23: Reviewed goals - eval only   PATIENT EDUCATION: Education details: See Today's Treatment, See Patient Instructions, compensations for cognition, accommodations in workplace Person educated: Patient and Spouse Education method: Explanation and Verbal cues Education comprehension: verbal cues required and needs further education   GOALS: Goals reviewed with patient? Yes  SHORT TERM GOALS: Target date: 10/28/23  Pt will carryover 2 strategies for  attention to successfully carryover household tasks that require alternating attention with supervision cues.  Baseline: Goal status: MET  2.  Pt will make safe decisions at home during household tasks with rare min A Baseline:  Goal status: MET  3.  Pt will verbalize 3 compensations for attention when driving (for if/when he is cleared my MD to drive) Baseline:  Goal status: DEFERRED   4. Pt will verbalize safety awareness in kitchen, garden, and work with rare min A             Goal status: MET   LONG TERM GOALS: Target date: 11/25/23  Pt will carryover compensations for attention on divided attention tasks at home Baseline:  Goal status: IN PROGRESS  2.  Pt will verbalize 3 strategies to support alternating and divided attention at work with rare min A Baseline:  Goal status: IN PROGRESS  3.  Pt will verbalize 3 accommodations to support cognition and energy conservation upon return to work Baseline:  Goal status: IN PROGRESS  ASSESSMENT:  CLINICAL IMPRESSION: Patient is a 72 y.o. male who was seen today for cognition s/p TBI and fall. Today he presents with mild high level cognitive impairment. He is accompanied by his spouse, Chales Abrahams. Wife reports concern re: current cognitive and physical functioning impacting return to work. He hopes to return to work soon.  SLP conducted ongoing education of cognitive strategies and assisted in problem-solving and strategy selection for work-related tasks. Pt will continue to benefit from ST to maximize safety for possible return to work, household tasks, and possible return to driving.   OBJECTIVE IMPAIRMENTS: include attention and awareness. These impairments are limiting patient from return to work and household responsibilities. Factors affecting potential to achieve goals and functional outcome are  n/a .[name] Patient will benefit from skilled SLP services to  address above impairments and improve overall function.  REHAB POTENTIAL:  Good  PLAN:  SLP FREQUENCY: 2x/week  SLP DURATION: 8 weeks  PLANNED INTERVENTIONS: Environmental controls, Cueing hierachy, Cognitive reorganization, Internal/external aids, Functional tasks, and Multimodal communication approach    Gracy Racer, CCC-SLP 11/02/2023, 9:36 AM

## 2023-11-02 NOTE — Therapy (Signed)
OUTPATIENT PHYSICAL THERAPY NEURO TREATMENT   Patient Name: Gregory [name] MRN: [id number] DOB:[birth year], 72 y.o., male Today's Date: 11/02/2023   PCP: Ronnald Nian, MD REFERRING PROVIDER: Willeen Niece, MD  END OF SESSION:  PT End of Session - 11/02/23 0912     Visit Number 15    Number of Visits 18    Date for PT Re-Evaluation 12/07/23    Authorization Type [employer]    PT Start Time 1015    PT Stop Time 1100    PT Time Calculation (min) 45 min    Equipment Utilized During Treatment Gait belt    Activity Tolerance Patient tolerated treatment well    Behavior During Therapy WFL for tasks assessed/performed             Past Medical History:  Diagnosis Date   Alcohol abuse    Aortic stenosis    Arthritis    Asthma    exercise indused or pollen-uses inhaler dail yand has rescue inhaler if needed   Bipolar disorder (HCC)    CAD (coronary artery disease)    coronary calcifications 08/2013 CTA   Carotid artery occlusion    Cataract    bilateral sx   Chronic back pain    Claudication (HCC)    Complication of anesthesia 10/16/2020   "had a panic attack when the mask was put on me"" I do not think I was given anything  before, I need something to calm me down"   Depression    Family history of skin cancer    Heart murmur    as an infant   Hepatitis    h/o 1970,doesn't remember which type,1990 epstain-barr   Herpes zoster without complication 09/05/2019   Hyperlipidemia    Hypertension    Neuromuscular disorder (HCC)    Obsessive compulsive disorder    PAD (peripheral artery disease) (HCC)    Peripheral neuropathy    Pneumonia    RBBB    RBBB (right bundle branch block with left anterior fascicular block)    Seasonal allergies    Severe aortic stenosis    Sleep apnea    does not wear CPAP   Smoker    Spinal stenosis at L4-L5 level    Tobacco abuse    Tobacco use disorder    Past Surgical History:  Procedure Laterality Date   ANAL  FISTULECTOMY  09/25/2011   AORTA - BILATERAL FEMORAL ARTERY BYPASS GRAFT N/A 10/03/2020   Procedure: AORTA BIFEMORAL BYPASS GRAFT USING A HEMASHIELD GOLD BIFURCATED 14 X 7mm GRAFT AND INFERIOR MESENTERIC ARTERY REIMPLANTATION;  Surgeon: Nada Libman, MD;  Location: [facility] OR;  Service: Vascular;  Laterality: N/A;   AORTIC VALVE REPLACEMENT N/A 11/24/2019   Procedure: AORTIC VALVE REPLACEMENT (AVR) using INSPIRIS Resilia 23 MM Bioprosthetic Aortic Valve.;  Surgeon: Alleen Borne, MD;  Location: [facility] OR;  Service: Open Heart Surgery;  Laterality: N/A;   APPLICATION OF WOUND VAC Left 10/17/2020   Procedure: APPLICATION OF WOUND VAC;  Surgeon: Nada Libman, MD;  Location: [facility] OR;  Service: Vascular;  Laterality: Left;   BACK SURGERY     2015   CATARACT EXTRACTION, BILATERAL  2022   COLONOSCOPY  2009   MS-F/V-mov(exc)-HPP   CORONARY ARTERY BYPASS GRAFT N/A 11/24/2019   Procedure: CORONARY ARTERY BYPASS GRAFTING (CABG) using LIMA to LAD.;  Surgeon: Alleen Borne, MD;  Location: [facility];  Service: Open Heart Surgery;  Laterality: N/A;   ELBOW SURGERY  bilaterally for cubital tunnel   ENDARTERECTOMY N/A 10/03/2020   Procedure: AORTIC ENDARTERECTOMY;  Surgeon: Nada Libman, MD;  Location: [facility];  Service: Vascular;  Laterality: N/A;   ENDARTERECTOMY FEMORAL Bilateral 10/03/2020   Procedure: BILATERAL FEMORAL ENDARTERECTOMY;  Surgeon: Nada Libman, MD;  Location: [facility];  Service: Vascular;  Laterality: Bilateral;   HERNIA REPAIR     with mesh   INCISION AND DRAINAGE Left 01/02/2021   Procedure: INCISION AND DRAINAGE LEFT GROIN WITH STIMULAN BEADS;  Surgeon: Nada Libman, MD;  Location: [facility];  Service: Vascular;  Laterality: Left;   INCISION AND DRAINAGE OF WOUND Left 10/17/2020   Procedure: EXPLORATION LEFT GROIN WOUND;  Surgeon: Nada Libman, MD;  Location: [facility];  Service: Vascular;  Laterality: Left;   MAXIMUM ACCESS (MAS)POSTERIOR LUMBAR INTERBODY FUSION (PLIF) 1 LEVEL N/A  10/25/2014   Procedure: LUMBAR FOUR TO FIVE MAXIMUM ACCESS (MAS) POSTERIOR LUMBAR INTERBODY FUSION (PLIF) 1 LEVEL;  Surgeon: Tia Alert, MD;  Location: [facility];  Service: Neurosurgery;  Laterality: N/A;   PACEMAKER IMPLANT N/A 10/06/2023   Procedure: PACEMAKER IMPLANT;  Surgeon: Regan Lemming, MD;  Location: [facility];  Service: Cardiovascular;  Laterality: N/A;   RIGHT/LEFT HEART CATH AND CORONARY ANGIOGRAPHY N/A 11/10/2019   Procedure: RIGHT/LEFT HEART CATH AND CORONARY ANGIOGRAPHY;  Surgeon: Swazil[place], [name], MD;  Location: [facility] INVASIVE CV LAB;  Service: Cardiovascular;  Laterality: N/A;   SKIN BIOPSY Left 10/12/2018   shave forehead Hypertrophic actinic kertosis with features of a verruca   TEE WITHOUT CARDIOVERSION N/A 11/24/2019   Procedure: TRANSESOPHAGEAL ECHOCARDIOGRAM (TEE);  Surgeon: Alleen Borne, MD;  Location: [facility] OR;  Service: Open Heart Surgery;  Laterality: N/A;   TONSILLECTOMY  age 37   VASECTOMY     X 2   VASECTOMY REVERSAL     Patient Active Problem List   Diagnosis Date Noted   Symptomatic bradycardia 10/05/2023   Subarachnoid hemorrhage following injury (HCC) 08/26/2023   Skull fracture with cerebral contusion (HCC) 08/26/2023   Temporal bone fracture (HCC) 08/26/2023   Aortic atherosclerosis (HCC) 05/26/2023   Acute non-recurrent pansinusitis 01/04/2023   Perennial allergic rhinitis 07/09/2022   Asthma-COPD overlap syndrome (HCC) 04/21/2022   Recurrent infections 04/21/2022   S/P aortobifemoral bypass surgery 04/03/2021   S/P CABG x 1 11/24/2019   S/P aortic valve replacement with bioprosthetic valve 11/24/2019   PAD (peripheral artery disease) (HCC)    Mild persistent asthma 10/29/2019   RBBB (right bundle branch block with left anterior fascicular block)    Peripheral neuropathy    S/P lumbar spinal fusion 10/25/2014   Recovering alcoholic in remission (HCC) 08/10/2014   Substance induced mood disorder (HCC) 06/30/2014    Substance-induced sleep disorder (HCC) 06/30/2014   Obsessive compulsive disorder    OSA (obstructive sleep apnea) 01/20/2014   CAD (coronary artery disease)    Mentally disabled 11/25/2012   Hyperlipidemia with target LDL less than 130 11/25/2012   Bipolar disorder (HCC)    Hypertension    Allergic rhinitis due to pollen 08/11/2011    ONSET DATE: 08/28/2023, resume orders 10/13/2023 provided by Dr. Loman Brooklyn, MD  REFERRING DIAG: R55 (ICD-10-CM) - Syncope, unspecified syncope type I60.9 (ICD-10-CM) - Subarachnoid hemorrhage (HCC)  THERAPY DIAG:  Unsteadiness on feet  Muscle weakness (generalized)  Other abnormalities of gait and mobility  Dizziness and giddiness  Rationale for Evaluation and Treatment: Rehabilitation  SUBJECTIVE:  SUBJECTIVE STATEMENT: Patient reports doing well. Did a lot of shopping and cooking this weekend. Denies falls.   Pt accompanied by:  Wife, Chales Abrahams   PERTINENT HISTORY:  pacemaker placement 09/29/2023, CABG, aortic valve replacement (2021)  PAIN:  Are you having pain? Yes: NPRS scale: 3-4/10 Pain location: L shoulder Pain description: achy Aggravating factors: Movement  Relieving factors: tylenol  PRECAUTIONS: Fall and Other: no bending forward (skull fx), cannot lift more than 10lbs until November 18, 2023   PATIENT GOALS: "To reduce my symptoms."  OBJECTIVE:   DIAGNOSTIC FINDINGS:   CT Head wo Contrast 09/08/2023 IMPRESSION: 1. Expected evolution of multicompartmental intracranial hemorrhage since 08/28/2023, with no residual hyperdense blood. 2. 3 mm thick hypodense right parietal convexity subdural collection is unchanged and may reflect a subdural hygroma or evolving subdural hematoma. 3. Unchanged right temporal/parietal bone fracture.  CT  Lumbar Spine wo Contrast 09/08/2023: IMPRESSION: 1. No acute fracture or traumatic malalignment of the lumbar spine. 2. Postsurgical changes reflecting posterior instrumented fusion and decompression at L5-S1 without residual spinal canal or neural foraminal stenosis. 3. Adjacent segment disease at L3-L4 resulting in at least moderate spinal canal stenosis and moderate bilateral neural foraminal stenosis.  CT of head from 08/27/23   IMPRESSION: 1. No significant interval change in bilateral frontal convexity subdural fluid collection measuring up to 6 mm. 2. Redemonstrated subarachnoid hemorrhage and hemorrhagic contusions along the bilateral anterior frontal lobes, slightly increased on the left measuring up 2.1 cm ( previously 1.9 cm), and unchanged along the right frontal lobe. Compared to prior exam there is interval increase in conspicuity of subarachnoid blood products along the bilateral parietal lobes, likely due to redistribution. No evidence of intraventricular extension. No hydrocephalus. 3. Redemonstrated acute nondisplaced fracture involving the right temporal bone.   VITALS Vitals:   11/02/23 1021  BP: 137/66  Pulse: 62    TODAY'S TREATMENT:           Ther Act  Assessed vitals (see above) and within limits for therapy  NMR -treadmill 1. 3% incline-5% incline adding in dual tasking   -took patient ~5 mins to talk through a simple recipe   - gait with lateral head turns to read playing cards, increased lateral excursion  -standing on Bosu ball distracting color Blaze pod hits  -suitcase squats with 10# R UE, 5# L UE     PATIENT EDUCATION: Education details: continue HEP Person educated: Patient and Spouse Education method: Explanation, Demonstration, and Handouts Education comprehension: verbalized understanding, returned demonstration, and needs further education  HOME EXERCISE PROGRAM:  Access Code: [email or web address] URL:  [email or web address] Date: 09/08/2023 Prepared by: Maryruth Eve  Exercises - Corner Balance Feet Together With Eyes Closed  - 1 x daily - 7 x weekly - 3 sets - 30 seconds hold - Pencil Pushups  - 1 x daily - 7 x weekly - 3 sets - 10 reps - Seated Horizontal Saccades  - 1 x daily - 7 x weekly - 3 sets - 10 reps - Seated Vertical Saccades  - 1 x daily - 7 x weekly - 3 sets - 10 reps -[place] eye tracking (1 paragraph)  -King Devick's Saccades 1x a day - Side Stepping with Resistance at Thighs and Counter Support  - 1 x daily - 7 x weekly - 3 sets - 10 reps - Forward Backward Monster Walk with Band at Thighs and Counter Support  - 1 x daily - 7 x weekly - 3 sets - 10 reps - Supine  Bridge  - 1 x daily - 7 x weekly - 3 sets - 10 reps - Clamshell  - 1 x daily - 7 x weekly - 3 sets - 10 reps - Squat with Chair Touch  - 1 x daily - 7 x weekly - 3 sets - 10 reps  Education on SPOT it game and HART chart use, VOR x 1 viewing 3 x 5 rounds Brock string ~3x a day with spouse guiding through   GOALS: Goals reviewed with patient? Yes  SHORT TERM GOALS: Target date: 09/29/2023   Pt will be independent with initial HEP for improved balance, transfers and gait.  Baseline: not established on eval; provided Goal status: MET  2.  Patient will improve RPQ-3 score to </= 2 per item to indicate reduced severity of post-concussive symptoms to progress towards PLOF.    Baseline: 3/4, 3/4 Goal status: NOT MET  3.  5x STS to be assessed and STG/LTG updated  Baseline: 15.65 seconds without UE use (SBA) Goal status: Discontinued STG due to how well performed   4.  Patient will improve mCTSIB score to 100/120 to indicate improved integration of vestibular, proprioceptive, and visual balance systems during static balance tasks in order to reduce risk for falls.   Baseline: 86/120; 120/120 Goal status: MET   LONG TERM GOALS: Target date: 10/27/2023   Pt will be independent with final HEP  for improved balance, transfers and gait.  Baseline: Reports partial compliance  Goal status: IN PROGRESS   2.  Patient will improve RPQ-13 score to </= 2 per item to indicate reduced severity of post-concussive symptoms to progress towards PLOF.   Baseline: greatest deficit reported 4/4, greatest defciti 3/4 on fatige and feeling depressed Goal status: NOT MET BUT PROGRESSED   3.  Patient will improve their 5x Sit to Stand score to less than 13 seconds to demonstrate a decreased risk for falls and improved LE strength.   Baseline: 15.65 seconds without UE use (SBA); 9.2 seconds without UE use (SBA); improved to 9.2 seconds  Goal status: MET  4. Patient will improve mCTSIB score to 120/120 to indicate improved integration of vestibular, proprioceptive, and visual balance systems during static balance tasks in order to reduce risk for falls.   Baseline: 86/120; 120/120  Goal status: MET  LONG TERM GOALS UPDATED FOLLOWING RETURN TO CARE: Target date: 12/07/2023  Pt will be independent with final HEP for improved balance, transfers and gait. Baseline: Reports partial compliance  Goal status: IN PROGRESS   2.  Patient will improve RPQ-13 score to </= 2 per item to indicate reduced severity of post-concussive symptoms to progress towards PLOF.   Baseline: greatest deficit reported 4/4, greatest defciti 3/4 on fatige and feeling depressed Goal status: NOT MET BUT PROGRESSED   3.  SOT to be assessed and LTG updated  Baseline: To be assessed  Goal status: Discontinued at this time due to pacemaker incision placement and harness that needs to be donned with testing   ASSESSMENT:  CLINICAL IMPRESSION: Patient seen for skilled PT session with emphasis on dual cog + motor tasks. Continues to demonstrate instability with tasks, alternating prioritizing cog or motor, which further increases his risk for falling. Over exaggerated hip strategy noted on unstable surface with priority for this over  initial ankle strategy. Continue POC.    OBJECTIVE IMPAIRMENTS: Abnormal gait, decreased activity tolerance, decreased balance, decreased cognition, decreased coordination, decreased endurance, decreased knowledge of condition, decreased knowledge of use of DME, decreased mobility,  difficulty walking, decreased strength, decreased safety awareness, dizziness, impaired perceived functional ability, impaired vision/preception, and pain  ACTIVITY LIMITATIONS: carrying, lifting, bending, sleeping, stairs, transfers, hygiene/grooming, locomotion level, and caring for others  PARTICIPATION LIMITATIONS: meal prep, cleaning, laundry, interpersonal relationship, driving, shopping, community activity, occupation, and yard work  PERSONAL FACTORS: Fitness, Past/current experiences, and 3+ comorbidities: Bradycardia, post-concussion w/LOC,   are also affecting patient's functional outcome.   REHAB POTENTIAL: Good  CLINICAL DECISION MAKING: Unstable/unpredictable  EVALUATION COMPLEXITY: High  PLAN:  PT FREQUENCY: 2x/week  PT DURATION: 4 additional weeks since last seen for therapy   PLANNED INTERVENTIONS: Therapeutic exercises, Therapeutic activity, Neuromuscular re-education, Balance training, Gait training, Patient/Family education, Self Care, Joint mobilization, Stair training, Vestibular training, Canalith repositioning, DME instructions, Aquatic Therapy, Dry Needling, Electrical stimulation, Manual therapy, and Re-evaluation  PLAN FOR NEXT SESSION: progress occulomotor/vestibular exercises (VORx1 as tolerated, Brock string), progress corner balance (EC on foam, tandem with head turns as able etc), consider neuropthomology referral pending progress; did OT/speech get scheduled, HART chart with balance work, work on Building surveyor with balance training or dynamic gait/balance without AD with visual scanning, midline orientation,   Continue to work on gait without AD and progress higher level balance  tasks as able. Pt would like to work on functional hip/LE strength    Westley Foots, PT Westley Foots, PT, DPT, CBIS 11/02/2023, 12:10 PM

## 2023-11-02 NOTE — Therapy (Signed)
OUTPATIENT OCCUPATIONAL THERAPY TREATMENT  Patient Name: Gregory [name] MRN: [id number] DOB:05-Dec-1951, 72 y.o., male Today's Date: 11/02/2023  PCP: Ronnald Nian, MD REFERRING PROVIDER: Ronnald Nian, MD   OCCUPATIONAL THERAPY DISCHARGE SUMMARY  Visits from Start of Care: 6  Current functional level related to goals / functional outcomes: Pt met 3 out of 3 STG and 3 out of 3 LTG. Pt and pt's spouse reported pt is participating in daily functional tasks well.   Remaining deficits: Visual-spatial deficit (pt reports vision is improving), general endurance and balance (PT is addressing).   Education / Equipment: Production manager, ergonomics/body mechanics, safety during daily tasks  Patient agrees to discharge. Patient goals were met. Patient is being discharged due to  pt meeting stated rehab goals and pt and pt's spouse reported being pleased with pt's current level of function.     END OF SESSION:  OT End of Session - 11/02/23 1224     Visit Number 6    Number of Visits 13    Date for OT Re-Evaluation 11/13/23    Authorization Type [employer]    Progress Note Due on Visit 10    OT Start Time 0933    OT Stop Time 1015    OT Time Calculation (min) 42 min    Equipment Utilized During Treatment gait belt    Activity Tolerance Patient tolerated treatment well    Behavior During Therapy WFL for tasks assessed/performed                 Past Medical History:  Diagnosis Date   Alcohol abuse    Aortic stenosis    Arthritis    Asthma    exercise indused or pollen-uses inhaler dail yand has rescue inhaler if needed   Bipolar disorder (HCC)    CAD (coronary artery disease)    coronary calcifications 08/2013 CTA   Carotid artery occlusion    Cataract    bilateral sx   Chronic back pain    Claudication (HCC)    Complication of anesthesia 10/16/2020   "had a panic attack when the mask was put on me"" I do not think I was given anything   before, I need something to calm me down"   Depression    Family history of skin cancer    Heart murmur    as an infant   Hepatitis    h/o 1970,doesn't remember which type,1990 epstain-barr   Herpes zoster without complication 09/05/2019   Hyperlipidemia    Hypertension    Neuromuscular disorder (HCC)    Obsessive compulsive disorder    PAD (peripheral artery disease) (HCC)    Peripheral neuropathy    Pneumonia    RBBB    RBBB (right bundle branch block with left anterior fascicular block)    Seasonal allergies    Severe aortic stenosis    Sleep apnea    does not wear CPAP   Smoker    Spinal stenosis at L4-L5 level    Tobacco abuse    Tobacco use disorder    Past Surgical History:  Procedure Laterality Date   ANAL FISTULECTOMY  09/25/2011   AORTA - BILATERAL FEMORAL ARTERY BYPASS GRAFT N/A 10/03/2020   Procedure: AORTA BIFEMORAL BYPASS GRAFT USING A HEMASHIELD GOLD BIFURCATED 14 X 7mm GRAFT AND INFERIOR MESENTERIC ARTERY REIMPLANTATION;  Surgeon: Nada Libman, MD;  Location: [facility] OR;  Service: Vascular;  Laterality: N/A;   AORTIC VALVE REPLACEMENT N/A 11/24/2019   Procedure: AORTIC  VALVE REPLACEMENT (AVR) using INSPIRIS Resilia 23 MM Bioprosthetic Aortic Valve.;  Surgeon: Alleen Borne, MD;  Location: [facility] OR;  Service: Open Heart Surgery;  Laterality: N/A;   APPLICATION OF WOUND VAC Left 10/17/2020   Procedure: APPLICATION OF WOUND VAC;  Surgeon: Nada Libman, MD;  Location: [facility] OR;  Service: Vascular;  Laterality: Left;   BACK SURGERY     2015   CATARACT EXTRACTION, BILATERAL  2022   COLONOSCOPY  2009   MS-F/V-mov(exc)-HPP   CORONARY ARTERY BYPASS GRAFT N/A 11/24/2019   Procedure: CORONARY ARTERY BYPASS GRAFTING (CABG) using LIMA to LAD.;  Surgeon: Alleen Borne, MD;  Location: [facility] OR;  Service: Open Heart Surgery;  Laterality: N/A;   ELBOW SURGERY     bilaterally for cubital tunnel   ENDARTERECTOMY N/A 10/03/2020   Procedure: AORTIC ENDARTERECTOMY;  Surgeon:  Nada Libman, MD;  Location: [facility];  Service: Vascular;  Laterality: N/A;   ENDARTERECTOMY FEMORAL Bilateral 10/03/2020   Procedure: BILATERAL FEMORAL ENDARTERECTOMY;  Surgeon: Nada Libman, MD;  Location: [facility];  Service: Vascular;  Laterality: Bilateral;   HERNIA REPAIR     with mesh   INCISION AND DRAINAGE Left 01/02/2021   Procedure: INCISION AND DRAINAGE LEFT GROIN WITH STIMULAN BEADS;  Surgeon: Nada Libman, MD;  Location: [facility];  Service: Vascular;  Laterality: Left;   INCISION AND DRAINAGE OF WOUND Left 10/17/2020   Procedure: EXPLORATION LEFT GROIN WOUND;  Surgeon: Nada Libman, MD;  Location: [facility];  Service: Vascular;  Laterality: Left;   MAXIMUM ACCESS (MAS)POSTERIOR LUMBAR INTERBODY FUSION (PLIF) 1 LEVEL N/A 10/25/2014   Procedure: LUMBAR FOUR TO FIVE MAXIMUM ACCESS (MAS) POSTERIOR LUMBAR INTERBODY FUSION (PLIF) 1 LEVEL;  Surgeon: Tia Alert, MD;  Location: [facility];  Service: Neurosurgery;  Laterality: N/A;   PACEMAKER IMPLANT N/A 10/06/2023   Procedure: PACEMAKER IMPLANT;  Surgeon: Regan Lemming, MD;  Location: [facility] INVASIVE CV [facility];  Service: Cardiovascular;  Laterality: N/A;   RIGHT/LEFT HEART CATH AND CORONARY ANGIOGRAPHY N/A 11/10/2019   Procedure: RIGHT/LEFT HEART CATH AND CORONARY ANGIOGRAPHY;  Surgeon: Swazil[place], [name], MD;  Location: [facility] INVASIVE CV LAB;  Service: Cardiovascular;  Laterality: N/A;   SKIN BIOPSY Left 10/12/2018   shave forehead Hypertrophic actinic kertosis with features of a verruca   TEE WITHOUT CARDIOVERSION N/A 11/24/2019   Procedure: TRANSESOPHAGEAL ECHOCARDIOGRAM (TEE);  Surgeon: Alleen Borne, MD;  Location: [facility] OR;  Service: Open Heart Surgery;  Laterality: N/A;   TONSILLECTOMY  age 34   VASECTOMY     X 2   VASECTOMY REVERSAL     Patient Active Problem List   Diagnosis Date Noted   Symptomatic bradycardia 10/05/2023   Subarachnoid hemorrhage following injury (HCC) 08/26/2023   Skull fracture with cerebral contusion  (HCC) 08/26/2023   Temporal bone fracture (HCC) 08/26/2023   Aortic atherosclerosis (HCC) 05/26/2023   Acute non-recurrent pansinusitis 01/04/2023   Perennial allergic rhinitis 07/09/2022   Asthma-COPD overlap syndrome (HCC) 04/21/2022   Recurrent infections 04/21/2022   S/P aortobifemoral bypass surgery 04/03/2021   S/P CABG x 1 11/24/2019   S/P aortic valve replacement with bioprosthetic valve 11/24/2019   PAD (peripheral artery disease) (HCC)    Mild persistent asthma 10/29/2019   RBBB (right bundle branch block with left anterior fascicular block)    Peripheral neuropathy    S/P lumbar spinal fusion 10/25/2014   Recovering alcoholic in remission (HCC) 08/10/2014   Substance induced mood disorder (HCC) 06/30/2014  Substance-induced sleep disorder (HCC) 06/30/2014   Obsessive compulsive disorder    OSA (obstructive sleep apnea) 01/20/2014   CAD (coronary artery disease)    Mentally disabled 11/25/2012   Hyperlipidemia with target LDL less than 130 11/25/2012   Bipolar disorder (HCC)    Hypertension    Allergic rhinitis due to pollen 08/11/2011    ONSET DATE: 09/15/2023 (referral date)   REFERRING DIAG: S06.9X1D (ICD-10-CM) - Traumatic brain injury, with loss of consciousness of 30 minutes or less, subsequent encounter R41.3 (ICD-10-CM) - Memory deficit  THERAPY DIAG:  Unsteadiness on feet  Muscle weakness (generalized)  Visuospatial deficit  Other symptoms and signs involving cognitive functions following nontraumatic subarachnoid hemorrhage  Rationale for Evaluation and Treatment: Rehabilitation  SUBJECTIVE:   SUBJECTIVE STATEMENT: Pt reports things are going well. Pt's spouse reports "every day is a little better." Pt reports no longer using walker in house so he doesn't have to remember it anymore. Pt reports keeping walker in designated place in home to remember to take walker when navigating in the community. Pt reports continuing to abide by pacemaker  precautions.  Pt accompanied by:  Spouse  PERTINENT HISTORY: presenting after syncopal event and fall on 08/25/23. Imaging showed acute subarachnoid hemorrhage with 1.8 cm inferior left frontal lobe hemorrhagic contusion without substantial mass effect and nondisplaced fracture of the right temporoparietal calvarium. PMH: CABG, aortic valve replacement, HTN, HLD, bipolar, bradycardia  Update - Pacemaker implant on 10/06/23  PRECAUTIONS: Other: no driving, fall risk, syncope, pacemaker implant 10/06/23  10/16/23 update - Pt and pt's spouse reported new precautions d/t pacemaker implant s/p 10/06/23: LUE - no lift/pull/push > 8-10 lbs.   10/22/23 update - Pt reports lifting limit precautions will be stopped on 11/18/23. Pt reports he is in process for return to work and has been approved for limited exercise.  WEIGHT BEARING RESTRICTIONS: No  PAIN:  Are you having pain? Pt reports 1/10 pain at L shoulder d/t "tendonitis"  FALLS: Has patient fallen in last 6 months? Yes. 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  HAND DOMINANCE: Left  ADLs: Eating: assist to cut food occasionally Grooming: independent/ UB Dressing: independ ent LB Dressing: independent Toileting: independent (raised) Bathing: mod I - standing some, seated some Tub Shower transfers: mod I  Equipment: Shower seat with back  IADLs: Shopping: pt goes with wife (pt used to do) Light housekeeping: wife always does majority, but pt has not returned to full laundry tasks Meal Prep: wife doing now for safety reasons, but both pt and wife took turns prior  to injury Community mobility: pt currently not driving since injury Medication management: independent Financial management: wife has always done  Handwriting:  pt reports always been horrible - no changes.   MOBILITY STATUS:  uses walker   ACTIVITY TOLERANCE: Activity tolerance: fatigues quicker   UPPER EXTREMITY ROM:  BUE AROM WFL's - pt has flexed ring and small fingers Lt hand from old injury but can fully open for short amounts of time   UPPER EXTREMITY MMT:   RUE 5/5 grossly, LUE 4+/5 grossly at shoulders   HAND FUNCTION: Grip strength: Right: 57.5 lbs; Left:  61.9 lbs  COORDINATION: 9 Hole Peg test: Right: 36.06 sec; Left: 35.92 sec (pt denies change and reports coordination bilaterally has been slightly slower for some time)   SENSATION: WFL Per pt report  EDEMA: none   COGNITION: Overall cognitive status:  difficult to assess, ? Safety awareness and executive functioning - see speech evaluation for details  VISION: Subjective report: not sure about visual changes, denies diplopia but wife reports issues with saccades and P.T. gave ex's for this and convergence Baseline vision: Wears glasses for reading only Visual history: corrective eye surgery for cataracts  VISION ASSESSMENT: TBA - d/t time constraints during evaluation  10/02/23 update - Pt accurately read clock on wall. Pt wears reading glasses, pt reports hx of cataract surgery, and pt reports need to update prescription d/t difficulty seeing objects far away. Pt and pt's spouse reported changes with vision throughout the 3 weeks after the fall event, such as paint chips on wall appearing as "floating" and moving through pt's vision. However, pt reports "floaters" have mostly resolved and has not noticed floaters recently.  Ocular movements when tracking - Mild atypical movements of both eyes while tracking a marker in all directions and more significant "jumping" of eye movements intermittently. Pt's spouse  reports eye movements "saccades" are greatly improved compared to immediately after the fall.  Peripheral vision approx. 50* on both R and L. Superior vision WFL.  Letter cancellation - Pt correctly identified 100% of letters in 4 out of 4 lines for identifying letters uppercase B, D, E, and F.    PERCEPTION: Not tested  PRAXIS: Not tested  OBSERVATIONS: very verbose, occasional redirection needed, ? Safety awareness.    TODAY'S TREATMENT:                                                                                                                               Therapeutic activities Assessed progress towards goals, see below.  Pt's spouse and pt reported pt is doing "fine" and "things have gotten a lot easier" in the kitchen (meal prep), laundry (lifting wet clothing limited currently by pacemaker precautions), rinsing dishes and putting dishes away. Pt reports completing vision HEP though overdoing some vision exercises, which resulted in dizziness. OT educated pt to stop exercises when fatigued, and pt verbalized understanding.   Visual scanning task in gym - walking with gait belt - Pt found 5 out of 5 items on L side and 6 out of 6 items on R side without v/c. Pt demo'd excellent visual scanning skills, looking R to L.   OT, pt, and pt's spouse reported OT has been great and been very helpful. Pt's spouse reports unsure of any new goals to set. Pt agreed and stated PT will continue to address balance concerns and stamina concerns. 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 Handout provided, see pt instructions. Pt verbalized understanding.  Pt and pt's spouse agreed to OT D/C. Pt and pt's spouse agreed goals are met and pt reports  feeling functional during daily tasks at home and for work.  OT educated pt on continuing HEP. Pt verbalized understanding.   PATIENT EDUCATION: Education details: review of pacemaker precautions during functional tasks, see today's treatment above, OT D/C and pt's progress towards goals Person educated: Patient and Spouse Education method: Explanation, Demonstration, Verbal cues, and Handouts Education comprehension: verbalized understanding, returned demonstration, and verbal cues required  HOME EXERCISE PROGRAM: Vision HEP (see pt instructions) 10/22/23 - Locate colored objects in environment with visual scanning (e.g. find all the green objects)   GOALS: Goals reviewed with patient? Yes  SHORT TERM GOALS: Target date: 10/21/23  Pt to be independent with HEP for overall strength/endurance UEs Baseline: Goal status: 10/22/23 - MET 10/22/23 - Pt reports completing HEP vision exercises a minimum of 3 times per day and increasing reps as tolerated. Pt demo'd understanding of exercises, x1 v/c to track marker tip fully to left side.  2.  Pt to return to standing for minimum of 10 minutes to perform light IADL's (folding clothes, washing dishes)  Baseline:  10/22/23 - Pt tolerated standing/walking for 8 to 13 minutes today without difficulty to complete functional tasks. Goal status: 10/22/23 - MET  3.  Pt to verbalize understanding with safety considerations for fall prevention during IADLS including appropriate use of walker for kitchen negotiation Baseline:  10/22/23 - Pt verbalized understanding of walker safety and using alternate stable surfaces (countertops) in kitchen for kitchen navigation. Pt and pt's spouse reported pt is demo'ing more consistent use of walker. Goal status:  10/22/23 - MET    LONG TERM GOALS: Target date: 11/13/23  Pt to perform environmental scanning in moderately distracting gym with 85% accuracy or greater in prep for potential return to  driving Baseline:  Goal status: 11/02/23 - MET  2.  Pt to demo safety w/ simple cooking task with direct supervision but no physical assist Baseline:  11/02/23 - Pt and pt's spouse reports no physical assistance required for tasks except for spouse setting up heavier pots d/t pacemaker precautions. Pt completes all remaining cooking tasks with supervision without walker. Goal status: 11/02/23 - MET  3.  10/02/23 Update following additional vision assessment -  Patient will demonstrate at least 85% accuracy with environmental visual scanning to assist with ADLs and IADLS including potential driving considerations.   Baseline:  Goal status: 11/02/23 - MET   ASSESSMENT:  CLINICAL IMPRESSION: Pt met 100% of STG and LTG. Pt demo'ing ind with functional daily tasks and excellent attention to visual compensation strategies involving visual scanning to both L and R. D/t pt meeting OT goals and demo'ing greater ind with daily tasks, pt to D/C from OT. Pt and pt's spouse agreed to OT D/C. Pt and pt's spouse agreed goals are met and pt reports feeling functional during daily tasks at home and for work. OT educated pt on continuing HEP. Pt verbalized understanding.  PERFORMANCE DEFICITS: in functional skills including IADLs, strength, Fine motor control, mobility, balance, endurance, cardiopulmonary status limiting function, decreased knowledge of precautions, decreased knowledge of use of DME, vision, and UE functional use, cognitive skills including problem solving and safety awareness, and psychosocial skills including coping strategies.   IMPAIRMENTS: are limiting patient from IADLs, work,  and leisure.   CO-MORBIDITIES: may have co-morbidities  that affects occupational performance. Patient will benefit from skilled OT to address above impairments and improve overall function.  MODIFICATION OR ASSISTANCE TO COMPLETE EVALUATION: No modification of tasks or assist necessary to complete an  evaluation.  OT OCCUPATIONAL PROFILE AND HISTORY: Problem focused assessment: Including review of records relating to presenting problem.  CLINICAL DECISION MAKING: Moderate - several treatment options, min-mod task modification necessary  REHAB POTENTIAL: Good  EVALUATION COMPLEXITY: Low    PLAN:  OT FREQUENCY: 2x/week  OT DURATION: 6 weeks, plus evaluation  PLANNED INTERVENTIONS: self care/ADL training, therapeutic exercise, therapeutic activity, neuromuscular re-education, manual therapy, passive range of motion, functional mobility training, splinting, patient/family education, cognitive remediation/compensation, visual/perceptual remediation/compensation, coping strategies training, DME and/or AE instructions, and Re-evaluation  RECOMMENDED OTHER SERVICES: none at this time  CONSULTED AND AGREED WITH PLAN OF CARE: Patient and spouse  PLAN FOR NEXT SESSION:   N/A - Pt to D/C from OT today.   Wynetta Emery, OT 11/02/2023, 12:34 PM

## 2023-11-03 ENCOUNTER — Institutional Professional Consult (permissible substitution): Payer: PPO | Admitting: Cardiology

## 2023-11-03 ENCOUNTER — Encounter: Payer: PPO | Admitting: Occupational Therapy

## 2023-11-03 ENCOUNTER — Ambulatory Visit: Payer: PPO

## 2023-11-03 NOTE — Therapy (Unsigned)
OUTPATIENT SPEECH LANGUAGE PATHOLOGY TREATMENT   Patient Name: Gregory [name] MRN: [id number] DOB:May 08, 1951, 72 y.o., male Today's Date: 11/04/2023  PCP: Ronnald Nian, MD REFERRING PROVIDER: Ronnald Nian, MD  END OF SESSION:  End of Session - 11/04/23 0846     Visit Number 6    Number of Visits 18    Date for SLP Re-Evaluation 11/25/23    SLP Start Time 0846    SLP Stop Time  0930    SLP Time Calculation (min) 44 min    Activity Tolerance Patient tolerated treatment well                 Past Medical History:  Diagnosis Date   Alcohol abuse    Aortic stenosis    Arthritis    Asthma    exercise indused or pollen-uses inhaler dail yand has rescue inhaler if needed   Bipolar disorder (HCC)    CAD (coronary artery disease)    coronary calcifications 08/2013 CTA   Carotid artery occlusion    Cataract    bilateral sx   Chronic back pain    Claudication (HCC)    Complication of anesthesia 10/16/2020   "had a panic attack when the mask was put on me"" I do not think I was given anything  before, I need something to calm me down"   Depression    Family history of skin cancer    Heart murmur    as an infant   Hepatitis    h/o 1970,doesn't remember which type,1990 epstain-barr   Herpes zoster without complication 09/05/2019   Hyperlipidemia    Hypertension    Neuromuscular disorder (HCC)    Obsessive compulsive disorder    PAD (peripheral artery disease) (HCC)    Peripheral neuropathy    Pneumonia    RBBB    RBBB (right bundle branch block with left anterior fascicular block)    Seasonal allergies    Severe aortic stenosis    Sleep apnea    does not wear CPAP   Smoker    Spinal stenosis at L4-L5 level    Tobacco abuse    Tobacco use disorder    Past Surgical History:  Procedure Laterality Date   ANAL FISTULECTOMY  09/25/2011   AORTA - BILATERAL FEMORAL ARTERY BYPASS GRAFT N/A 10/03/2020   Procedure: AORTA BIFEMORAL BYPASS GRAFT USING A  HEMASHIELD GOLD BIFURCATED 14 X 7mm GRAFT AND INFERIOR MESENTERIC ARTERY REIMPLANTATION;  Surgeon: Nada Libman, MD;  Location: [facility] OR;  Service: Vascular;  Laterality: N/A;   AORTIC VALVE REPLACEMENT N/A 11/24/2019   Procedure: AORTIC VALVE REPLACEMENT (AVR) using INSPIRIS Resilia 23 MM Bioprosthetic Aortic Valve.;  Surgeon: Alleen Borne, MD;  Location: [facility] OR;  Service: Open Heart Surgery;  Laterality: N/A;   APPLICATION OF WOUND VAC Left 10/17/2020   Procedure: APPLICATION OF WOUND VAC;  Surgeon: Nada Libman, MD;  Location: [facility] OR;  Service: Vascular;  Laterality: Left;   BACK SURGERY     2015   CATARACT EXTRACTION, BILATERAL  2022   COLONOSCOPY  2009   MS-F/V-mov(exc)-HPP   CORONARY ARTERY BYPASS GRAFT N/A 11/24/2019   Procedure: CORONARY ARTERY BYPASS GRAFTING (CABG) using LIMA to LAD.;  Surgeon: Alleen Borne, MD;  Location: [facility] OR;  Service: Open Heart Surgery;  Laterality: N/A;   ELBOW SURGERY     bilaterally for cubital tunnel   ENDARTERECTOMY N/A 10/03/2020   Procedure: AORTIC ENDARTERECTOMY;  Surgeon: Nada Libman, MD;  Location:  [facility] OR;  Service: Vascular;  Laterality: N/A;   ENDARTERECTOMY FEMORAL Bilateral 10/03/2020   Procedure: BILATERAL FEMORAL ENDARTERECTOMY;  Surgeon: Nada Libman, MD;  Location: [facility];  Service: Vascular;  Laterality: Bilateral;   HERNIA REPAIR     with mesh   INCISION AND DRAINAGE Left 01/02/2021   Procedure: INCISION AND DRAINAGE LEFT GROIN WITH STIMULAN BEADS;  Surgeon: Nada Libman, MD;  Location: [facility];  Service: Vascular;  Laterality: Left;   INCISION AND DRAINAGE OF WOUND Left 10/17/2020   Procedure: EXPLORATION LEFT GROIN WOUND;  Surgeon: Nada Libman, MD;  Location: [facility];  Service: Vascular;  Laterality: Left;   MAXIMUM ACCESS (MAS)POSTERIOR LUMBAR INTERBODY FUSION (PLIF) 1 LEVEL N/A 10/25/2014   Procedure: LUMBAR FOUR TO FIVE MAXIMUM ACCESS (MAS) POSTERIOR LUMBAR INTERBODY FUSION (PLIF) 1 LEVEL;  Surgeon: Tia Alert,  MD;  Location: [facility];  Service: Neurosurgery;  Laterality: N/A;   PACEMAKER IMPLANT N/A 10/06/2023   Procedure: PACEMAKER IMPLANT;  Surgeon: Regan Lemming, MD;  Location: [facility];  Service: Cardiovascular;  Laterality: N/A;   RIGHT/LEFT HEART CATH AND CORONARY ANGIOGRAPHY N/A 11/10/2019   Procedure: RIGHT/LEFT HEART CATH AND CORONARY ANGIOGRAPHY;  Surgeon: Swaziland, [name], MD;  Location: [facility] INVASIVE CV LAB;  Service: Cardiovascular;  Laterality: N/A;   SKIN BIOPSY Left 10/12/2018   shave forehead Hypertrophic actinic kertosis with features of a verruca   TEE WITHOUT CARDIOVERSION N/A 11/24/2019   Procedure: TRANSESOPHAGEAL ECHOCARDIOGRAM (TEE);  Surgeon: Alleen Borne, MD;  Location: [facility] OR;  Service: Open Heart Surgery;  Laterality: N/A;   TONSILLECTOMY  age 14   VASECTOMY     X 2   VASECTOMY REVERSAL     Patient Active Problem List   Diagnosis Date Noted   Symptomatic bradycardia 10/05/2023   Subarachnoid hemorrhage following injury (HCC) 08/26/2023   Skull fracture with cerebral contusion (HCC) 08/26/2023   Temporal bone fracture (HCC) 08/26/2023   Aortic atherosclerosis (HCC) 05/26/2023   Acute non-recurrent pansinusitis 01/04/2023   Perennial allergic rhinitis 07/09/2022   Asthma-COPD overlap syndrome (HCC) 04/21/2022   Recurrent infections 04/21/2022   S/P aortobifemoral bypass surgery 04/03/2021   S/P CABG x 1 11/24/2019   S/P aortic valve replacement with bioprosthetic valve 11/24/2019   PAD (peripheral artery disease) (HCC)    Mild persistent asthma 10/29/2019   RBBB (right bundle branch block with left anterior fascicular block)    Peripheral neuropathy    S/P lumbar spinal fusion 10/25/2014   Recovering alcoholic in remission (HCC) 08/10/2014   Substance induced mood disorder (HCC) 06/30/2014   Substance-induced sleep disorder (HCC) 06/30/2014   Obsessive compulsive disorder    OSA (obstructive sleep apnea) 01/20/2014   CAD (coronary artery  disease)    Mentally disabled 11/25/2012   Hyperlipidemia with target LDL less than 130 11/25/2012   Bipolar disorder (HCC)    Hypertension    Allergic rhinitis due to pollen 08/11/2011    ONSET DATE: 08/25/23   REFERRING DIAG: W10.9X1D (ICD-10-CM) - Traumatic brain injury, with loss of consciousness of 30 minutes or less, subsequent encounter R41.3 (ICD-10-CM) - Memory deficit  THERAPY DIAG: Cognitive communication deficit  Rationale for Evaluation and Treatment: Rehabilitation  SUBJECTIVE:   SUBJECTIVE STATEMENT: Reports feeling confident in navigating cognitive load for returning to work. Wife has some remaining concerns.  Pt accompanied by: significant other Chales Abrahams  PERTINENT HISTORY: Pt reports long history of cardiac issues/dizziness, but has never lost consciousness. Pt reports he felt  dizzy and was reaching up at an aisle in [employer]. Lost consciousness and fell backwards onto the concrete floor and his his head. Does not know how long he was unconscious, was found by another shopper. Has a history of bradycardia, wore a heart monitor for 2 weeks and was cleared by his cardiologist in August to exercise prior to this incident. Works as a Investment banker, operational at [employer] and is used to weightlifting/cardio several times per week.   PAIN:  Are you having pain? No  FALLS: Has patient fallen in last 6 months?  See PT evaluation for details  LIVING ENVIRONMENT: Lives with: lives with their spouse Lives in: House/apartment  PLOF:  Level of assistance: Independent with ADLs, Independent with IADLs Employment: Part-time employment  PATIENT GOALS: "To go back to work and drive"  OBJECTIVE:   TODAY'S TREATMENT:                                                                                                                                         11/04/23: Continued discussion of potential cognitive compensations to aid return to work. Targeted anticipatory awareness and safety  judgement of work tasks. With occasional min A, pt identified potential limitations at work (physical or cognitive) and determined reasonable accommodations (e.g., having stool nearby to sit as needed). SLP recommended having all accommodations explicitly written by doctor and pt agreed. In simulated work activity, pt demonstrated alternating attention to complete task with independent awareness of need to double check.   11/02/23: Discussed pt desire to return to work and the cognitive skills required for successful return. Current medical recommendation indicating return in ~4 weeks but pt would like to return sooner if possible pending medical clearance. Targeted meta-analysis of work tasks, cognitive sequencing for select tasks, and possible modifications needed to optimize success. Adequate safety awareness noted when typical culinary safety concerns discussed. Able to ID accommodation x3 to account for some physical limitations (I.e., weight bearing restrictions) and novel event planning (ex: using visual supports) but exhibited reduced anticipatory awareness of overall significant increase in physical load (I.e., standing for 6 hours) when wife detailed her concerns. Continue to target emergent/anticipatory awareness, safety judgement, and task completion in upcoming sessions if applicable.   10/28/23: Pt reports overall cognitive improvement with home-based tasks. He accurately recalled and implemented trained techniques discussed last session. Endorsed attention challenges completing entirety of task before moving on to the next (ex: putting items used to make lunch away). SLP A pt in problem-solving strategies to remind himself to return to task with min prompting. Pt questioned activities to increase cognitive functioning. SLP provided handout and educated pt on the "mind diet" and activities to promote brain health. Wife reports concern for pt's desire to return to work due to current cognitive  skills. Plan to address next session.   10/21/23: Endorsed feeling frustrated with management of fluctuating schedule, numerous appointments,  and health journey in general. Demonstrated ability to re-frame and reduce frustration re: therapeutic process and recovery expectations, which has aided mental clarity. Identified he was previously successful when structure and rigid framework in place (ex: work) versus daily schedule differences. Recommended using prior strengths to modify/compensate for current cognitive challenges, such as using visual aids and verbal sequencing to aid retention and attention. Endorsed some difficulty mult-taking at home, resulting in deviations in attention and forgetting to use walker. Targeted meta-cognitive analysis and cognitive strategy selection with pt able to ID appropriate solution with min prompting. Pt able demo and carryover targeted solution (enclosing self with walker) with rare min A in stimulating environment. Reported overall good carryover of targeted recommendations from last session, which aided safety at home.    10/14/23: Pt experienced medical event since prior evaluation session and had a pacemaker implanted. Endorses use of walker and limited challenges, although wife reports otherwise. Wife stated that pt is not using walker consistently, such as walking by it when leaving couch, leaving it in one kitchen door and exiting out the other, and not bringing it to the bathroom. Pt agreed with wife's statements and commented "out of sight out of mind." SLP led pt and wife in generating functional solutions, with wife stating she can place chair in second kitchen doorway as a visual cue to use the other one (where walker is placed). Additionally SLP recommended keeping walker within a couple feet at all times, such as when watching tv on the couch. Pt reports returning to simple cooking tasks with wife assisting with cutting, as pt cannot use knife. Demonstrated  anticipatory awareness skills when noting that 3-minutes was too long for toast (it burned) and that he would set the timer for less next time.   09/30/23: Reviewed goals - eval only   PATIENT EDUCATION: Education details: See Today's Treatment, See Patient Instructions, compensations for cognition, accommodations in workplace Person educated: Patient and Spouse Education method: Explanation and Verbal cues Education comprehension: verbal cues required and needs further education   GOALS: Goals reviewed with patient? Yes  SHORT TERM GOALS: Target date: 10/28/23  Pt will carryover 2 strategies for attention to successfully carryover household tasks that require alternating attention with supervision cues.  Baseline: Goal status: MET  2.  Pt will make safe decisions at home during household tasks with rare min A Baseline:  Goal status: MET  3.  Pt will verbalize 3 compensations for attention when driving (for if/when he is cleared my MD to drive) Baseline:  Goal status: DEFERRED   4. Pt will verbalize safety awareness in kitchen, garden, and work with rare min A             Goal status: MET   LONG TERM GOALS: Target date: 11/25/23  Pt will carryover compensations for attention on divided attention tasks at home Baseline:  Goal status: MET  2.  Pt will verbalize 3 strategies to support alternating and divided attention at work with rare min A Baseline:  Goal status: MET  3.  Pt will verbalize 3 accommodations to support cognition and energy conservation upon return to work Baseline:  Goal status: IN PROGRESS  ASSESSMENT:  CLINICAL IMPRESSION: Patient is a 72 y.o. male who was seen today for cognition s/p TBI and fall. Today he presents with mild high level cognitive impairment. He is accompanied by his spouse, Chales Abrahams. Wife reports concern re: current cognitive and physical functioning impacting return to work. He hopes to return to  work soon.  SLP conducted ongoing  education of cognitive strategies and assisted in problem-solving and strategy selection for work-related tasks. Pt will continue to benefit from ST to maximize safety for possible return to work, household tasks, and possible return to driving.   OBJECTIVE IMPAIRMENTS: include attention and awareness. These impairments are limiting patient from return to work and household responsibilities. Factors affecting potential to achieve goals and functional outcome are  n/a .[name] Patient will benefit from skilled SLP services to address above impairments and improve overall function.  REHAB POTENTIAL: Good  PLAN:  SLP FREQUENCY: 2x/week  SLP DURATION: 8 weeks  PLANNED INTERVENTIONS: Environmental controls, Cueing hierachy, Cognitive reorganization, Internal/external aids, Functional tasks, and Multimodal communication approach    Gracy Racer, CCC-SLP 11/04/2023, 8:46 AM

## 2023-11-04 ENCOUNTER — Ambulatory Visit: Payer: PPO | Admitting: Physical Therapy

## 2023-11-04 ENCOUNTER — Encounter: Payer: PPO | Admitting: Speech Pathology

## 2023-11-04 ENCOUNTER — Encounter: Payer: PPO | Admitting: Occupational Therapy

## 2023-11-04 ENCOUNTER — Ambulatory Visit: Payer: PPO

## 2023-11-04 VITALS — BP 150/73 | HR 61

## 2023-11-04 DIAGNOSIS — R41841 Cognitive communication deficit: Secondary | ICD-10-CM

## 2023-11-04 DIAGNOSIS — M6281 Muscle weakness (generalized): Secondary | ICD-10-CM

## 2023-11-04 DIAGNOSIS — R2681 Unsteadiness on feet: Secondary | ICD-10-CM | POA: Diagnosis not present

## 2023-11-04 DIAGNOSIS — R2689 Other abnormalities of gait and mobility: Secondary | ICD-10-CM

## 2023-11-04 NOTE — Therapy (Signed)
OUTPATIENT PHYSICAL THERAPY NEURO TREATMENT   Patient Name: Gregory [name] MRN: [id number] DOB:07/04/1951, 72 y.o., male Today's Date: 11/04/2023   PCP: Ronnald Nian, MD REFERRING PROVIDER: Willeen Niece, MD  END OF SESSION:  PT End of Session - 11/04/23 0936     Visit Number 16    Number of Visits 18    Date for PT Re-Evaluation 12/07/23    Authorization Type [employer]    PT Start Time 0934    PT Stop Time 1015    PT Time Calculation (min) 41 min    Equipment Utilized During Treatment Gait belt    Activity Tolerance Patient tolerated treatment well    Behavior During Therapy WFL for tasks assessed/performed              Past Medical History:  Diagnosis Date   Alcohol abuse    Aortic stenosis    Arthritis    Asthma    exercise indused or pollen-uses inhaler dail yand has rescue inhaler if needed   Bipolar disorder (HCC)    CAD (coronary artery disease)    coronary calcifications 08/2013 CTA   Carotid artery occlusion    Cataract    bilateral sx   Chronic back pain    Claudication (HCC)    Complication of anesthesia 10/16/2020   "had a panic attack when the mask was put on me"" I do not think I was given anything  before, I need something to calm me down"   Depression    Family history of skin cancer    Heart murmur    as an infant   Hepatitis    h/o 1970,doesn't remember which type,1990 epstain-barr   Herpes zoster without complication 09/05/2019   Hyperlipidemia    Hypertension    Neuromuscular disorder (HCC)    Obsessive compulsive disorder    PAD (peripheral artery disease) (HCC)    Peripheral neuropathy    Pneumonia    RBBB    RBBB (right bundle branch block with left anterior fascicular block)    Seasonal allergies    Severe aortic stenosis    Sleep apnea    does not wear CPAP   Smoker    Spinal stenosis at L4-L5 level    Tobacco abuse    Tobacco use disorder    Past Surgical History:  Procedure Laterality Date    ANAL FISTULECTOMY  09/25/2011   AORTA - BILATERAL FEMORAL ARTERY BYPASS GRAFT N/A 10/03/2020   Procedure: AORTA BIFEMORAL BYPASS GRAFT USING A HEMASHIELD GOLD BIFURCATED 14 X 7mm GRAFT AND INFERIOR MESENTERIC ARTERY REIMPLANTATION;  Surgeon: Nada Libman, MD;  Location: [facility] OR;  Service: Vascular;  Laterality: N/A;   AORTIC VALVE REPLACEMENT N/A 11/24/2019   Procedure: AORTIC VALVE REPLACEMENT (AVR) using INSPIRIS Resilia 23 MM Bioprosthetic Aortic Valve.;  Surgeon: Alleen Borne, MD;  Location: [facility];  Service: Open Heart Surgery;  Laterality: N/A;   APPLICATION OF WOUND VAC Left 10/17/2020   Procedure: APPLICATION OF WOUND VAC;  Surgeon: Nada Libman, MD;  Location: [facility];  Service: Vascular;  Laterality: Left;   BACK SURGERY     2015   CATARACT EXTRACTION, BILATERAL  2022   COLONOSCOPY  2009   MS-F/V-mov(exc)-HPP   CORONARY ARTERY BYPASS GRAFT N/A 11/24/2019   Procedure: CORONARY ARTERY BYPASS GRAFTING (CABG) using LIMA to LAD.;  Surgeon: Alleen Borne, MD;  Location: [facility];  Service: Open Heart Surgery;  Laterality: N/A;   ELBOW SURGERY  bilaterally for cubital tunnel   ENDARTERECTOMY N/A 10/03/2020   Procedure: AORTIC ENDARTERECTOMY;  Surgeon: Nada Libman, MD;  Location: [facility];  Service: Vascular;  Laterality: N/A;   ENDARTERECTOMY FEMORAL Bilateral 10/03/2020   Procedure: BILATERAL FEMORAL ENDARTERECTOMY;  Surgeon: Nada Libman, MD;  Location: [facility];  Service: Vascular;  Laterality: Bilateral;   HERNIA REPAIR     with mesh   INCISION AND DRAINAGE Left 01/02/2021   Procedure: INCISION AND DRAINAGE LEFT GROIN WITH STIMULAN BEADS;  Surgeon: Nada Libman, MD;  Location: [facility];  Service: Vascular;  Laterality: Left;   INCISION AND DRAINAGE OF WOUND Left 10/17/2020   Procedure: EXPLORATION LEFT GROIN WOUND;  Surgeon: Nada Libman, MD;  Location: [facility];  Service: Vascular;  Laterality: Left;   MAXIMUM ACCESS (MAS)POSTERIOR LUMBAR INTERBODY FUSION (PLIF) 1 LEVEL  N/A 10/25/2014   Procedure: LUMBAR FOUR TO FIVE MAXIMUM ACCESS (MAS) POSTERIOR LUMBAR INTERBODY FUSION (PLIF) 1 LEVEL;  Surgeon: Tia Alert, MD;  Location: [facility];  Service: Neurosurgery;  Laterality: N/A;   PACEMAKER IMPLANT N/A 10/06/2023   Procedure: PACEMAKER IMPLANT;  Surgeon: Regan Lemming, MD;  Location: [facility];  Service: Cardiovascular;  Laterality: N/A;   RIGHT/LEFT HEART CATH AND CORONARY ANGIOGRAPHY N/A 11/10/2019   Procedure: RIGHT/LEFT HEART CATH AND CORONARY ANGIOGRAPHY;  Surgeon: Swaziland, [name], MD;  Location: [facility] INVASIVE CV LAB;  Service: Cardiovascular;  Laterality: N/A;   SKIN BIOPSY Left 10/12/2018   shave forehead Hypertrophic actinic kertosis with features of a verruca   TEE WITHOUT CARDIOVERSION N/A 11/24/2019   Procedure: TRANSESOPHAGEAL ECHOCARDIOGRAM (TEE);  Surgeon: Alleen Borne, MD;  Location: [facility];  Service: Open Heart Surgery;  Laterality: N/A;   TONSILLECTOMY  age 82   VASECTOMY     X 2   VASECTOMY REVERSAL     Patient Active Problem List   Diagnosis Date Noted   Symptomatic bradycardia 10/05/2023   Subarachnoid hemorrhage following injury (HCC) 08/26/2023   Skull fracture with cerebral contusion (HCC) 08/26/2023   Temporal bone fracture (HCC) 08/26/2023   Aortic atherosclerosis (HCC) 05/26/2023   Acute non-recurrent pansinusitis 01/04/2023   Perennial allergic rhinitis 07/09/2022   Asthma-COPD overlap syndrome (HCC) 04/21/2022   Recurrent infections 04/21/2022   S/P aortobifemoral bypass surgery 04/03/2021   S/P CABG x 1 11/24/2019   S/P aortic valve replacement with bioprosthetic valve 11/24/2019   PAD (peripheral artery disease) (HCC)    Mild persistent asthma 10/29/2019   RBBB (right bundle branch block with left anterior fascicular block)    Peripheral neuropathy    S/P lumbar spinal fusion 10/25/2014   Recovering alcoholic in remission (HCC) 08/10/2014   Substance induced mood disorder (HCC) 06/30/2014    Substance-induced sleep disorder (HCC) 06/30/2014   Obsessive compulsive disorder    OSA (obstructive sleep apnea) 01/20/2014   CAD (coronary artery disease)    Mentally disabled 11/25/2012   Hyperlipidemia with target LDL less than 130 11/25/2012   Bipolar disorder (HCC)    Hypertension    Allergic rhinitis due to pollen 08/11/2011    ONSET DATE: 08/28/2023, resume orders 10/13/2023 provided by Dr. Loman Brooklyn, MD  REFERRING DIAG: R55 (ICD-10-CM) - Syncope, unspecified syncope type I60.9 (ICD-10-CM) - Subarachnoid hemorrhage (HCC)  THERAPY DIAG:  Unsteadiness on feet  Muscle weakness (generalized)  Other abnormalities of gait and mobility  Rationale for Evaluation and Treatment: Rehabilitation  SUBJECTIVE:  SUBJECTIVE STATEMENT: Patient reports going back to the gym yesterday, rode the bike for 2.5 miles but did not do any leg exercises.Is a bit sore but no pain. HEP is going well.   Pt accompanied by:  Wife, Gregory [name]   PERTINENT HISTORY:  pacemaker placement 09/29/2023, CABG, aortic valve replacement (2021)  PAIN:  Are you having pain? No  PRECAUTIONS: Fall and Other: no bending forward (skull fx), cannot lift more than 10lbs until November 18, 2023   PATIENT GOALS: "To reduce my symptoms."  OBJECTIVE:   DIAGNOSTIC FINDINGS:   CT Head wo Contrast 09/08/2023 IMPRESSION: 1. Expected evolution of multicompartmental intracranial hemorrhage since 08/28/2023, with no residual hyperdense blood. 2. 3 mm thick hypodense right parietal convexity subdural collection is unchanged and may reflect a subdural hygroma or evolving subdural hematoma. 3. Unchanged right temporal/parietal bone fracture.  CT Lumbar Spine wo Contrast 09/08/2023: IMPRESSION: 1. No acute fracture or traumatic  malalignment of the lumbar spine. 2. Postsurgical changes reflecting posterior instrumented fusion and decompression at L5-S1 without residual spinal canal or neural foraminal stenosis. 3. Adjacent segment disease at L3-L4 resulting in at least moderate spinal canal stenosis and moderate bilateral neural foraminal stenosis.  CT of head from 08/27/23   IMPRESSION: 1. No significant interval change in bilateral frontal convexity subdural fluid collection measuring up to 6 mm. 2. Redemonstrated subarachnoid hemorrhage and hemorrhagic contusions along the bilateral anterior frontal lobes, slightly increased on the left measuring up 2.1 cm ( previously 1.9 cm), and unchanged along the right frontal lobe. Compared to prior exam there is interval increase in conspicuity of subarachnoid blood products along the bilateral parietal lobes, likely due to redistribution. No evidence of intraventricular extension. No hydrocephalus. 3. Redemonstrated acute nondisplaced fracture involving the right temporal bone.   VITALS Vitals:   11/04/23 0943  BP: (!) 150/73  Pulse: 61     TODAY'S TREATMENT:           Ther Act  Assessed vitals (see above) and systolic BP elevated but within limits for therapy. Pt reports he did not eat or take his meds this morning due to the early appointment time.   Ther Ex The following treadmill training was completed for aerobic/neural priming, endurance, and gait speed. No harness used.   - Warmup: 2:00 up to 2.0 mph w/BUE support   - HIIT: 5:00 30sec ON/OFF alternating 2.0 / 3.0-3.8 mph   -cool down: 90 seconds at 2.5 mph  In // bars, alt glute step ups to 8" box w/20# KB, x25 reps w/LUE support on bar. Pt required max multimodal cues to properly perform, as he attempted to squat KB down and step forward off box rather than posteriorly. Pt reports feeling movement in lower back rather than glutes.  Leg presses for improved functional quad and hip strength:  Double  leg st 60#, x15 reps  Single leg at 40#, x12 per side    PATIENT EDUCATION: Education details: continue HEP, reviewed proper exercise parameters at the gym (warmup for 10 minutes then choose 2-3 machines to do w/weight capped at 60-70#)  Person educated: Patient and Spouse Education method: Explanation, Demonstration, and Handouts Education comprehension: verbalized understanding, returned demonstration, and needs further education  HOME EXERCISE PROGRAM:  Access Code: [email or web address] URL: [email or web address] Date: 09/08/2023 Prepared by: Maryruth Eve  Exercises - Corner Balance Feet Together With Eyes Closed  - 1 x daily - 7 x weekly - 3 sets - 30 seconds hold - Pencil Pushups  - 1 x  daily - 7 x weekly - 3 sets - 10 reps - Seated Horizontal Saccades  - 1 x daily - 7 x weekly - 3 sets - 10 reps - Seated Vertical Saccades  - 1 x daily - 7 x weekly - 3 sets - 10 reps -[place] eye tracking (1 paragraph)  -King Devick's Saccades 1x a day - Side Stepping with Resistance at Thighs and Counter Support  - 1 x daily - 7 x weekly - 3 sets - 10 reps - Forward Backward Monster Walk with Band at Thighs and Counter Support  - 1 x daily - 7 x weekly - 3 sets - 10 reps - Supine Bridge  - 1 x daily - 7 x weekly - 3 sets - 10 reps - Clamshell  - 1 x daily - 7 x weekly - 3 sets - 10 reps - Squat with Chair Touch  - 1 x daily - 7 x weekly - 3 sets - 10 reps  Education on SPOT it game and HART chart use, VOR x 1 viewing 3 x 5 rounds Brock string ~3x a day with spouse guiding through   GOALS: Goals reviewed with patient? Yes  SHORT TERM GOALS: Target date: 09/29/2023   Pt will be independent with initial HEP for improved balance, transfers and gait.  Baseline: not established on eval; provided Goal status: MET  2.  Patient will improve RPQ-3 score to </= 2 per item to indicate reduced severity of post-concussive symptoms to progress towards PLOF.    Baseline: 3/4, 3/4 Goal status:  NOT MET  3.  5x STS to be assessed and STG/LTG updated  Baseline: 15.65 seconds without UE use (SBA) Goal status: Discontinued STG due to how well performed   4.  Patient will improve mCTSIB score to 100/120 to indicate improved integration of vestibular, proprioceptive, and visual balance systems during static balance tasks in order to reduce risk for falls.   Baseline: 86/120; 120/120 Goal status: MET   LONG TERM GOALS: Target date: 10/27/2023   Pt will be independent with final HEP for improved balance, transfers and gait.  Baseline: Reports partial compliance  Goal status: IN PROGRESS   2.  Patient will improve RPQ-13 score to </= 2 per item to indicate reduced severity of post-concussive symptoms to progress towards PLOF.   Baseline: greatest deficit reported 4/4, greatest defciti 3/4 on fatige and feeling depressed Goal status: NOT MET BUT PROGRESSED   3.  Patient will improve their 5x Sit to Stand score to less than 13 seconds to demonstrate a decreased risk for falls and improved LE strength.   Baseline: 15.65 seconds without UE use (SBA); 9.2 seconds without UE use (SBA); improved to 9.2 seconds  Goal status: MET  4. Patient will improve mCTSIB score to 120/120 to indicate improved integration of vestibular, proprioceptive, and visual balance systems during static balance tasks in order to reduce risk for falls.   Baseline: 86/120; 120/120  Goal status: MET  LONG TERM GOALS UPDATED FOLLOWING RETURN TO CARE: Target date: 12/07/2023  Pt will be independent with final HEP for improved balance, transfers and gait. Baseline: Reports partial compliance  Goal status: IN PROGRESS   2.  Patient will improve RPQ-13 score to </= 2 per item to indicate reduced severity of post-concussive symptoms to progress towards PLOF.   Baseline: greatest deficit reported 4/4, greatest defciti 3/4 on fatige and feeling depressed Goal status: NOT MET BUT PROGRESSED   3.  SOT to be assessed and  LTG updated  Baseline: To be assessed  Goal status: Discontinued at this time due to pacemaker incision placement and harness that needs to be donned with testing   ASSESSMENT:  CLINICAL IMPRESSION: Emphasis of skilled PT session on improved endurance and functional LE strength. Pt continues to require education regarding safe exercise parameters, as pt frequently stating things are "too slow" and "too light". Informed pt that he is very deconditioned and needs to start slow and light to avoid injury. Pt verbalized understanding. Pt unable to follow visual cues this date for proper exercise performance required max verbal and tactile cues to perform. Continue POC.     OBJECTIVE IMPAIRMENTS: Abnormal gait, decreased activity tolerance, decreased balance, decreased cognition, decreased coordination, decreased endurance, decreased knowledge of condition, decreased knowledge of use of DME, decreased mobility, difficulty walking, decreased strength, decreased safety awareness, dizziness, impaired perceived functional ability, impaired vision/preception, and pain  ACTIVITY LIMITATIONS: carrying, lifting, bending, sleeping, stairs, transfers, hygiene/grooming, locomotion level, and caring for others  PARTICIPATION LIMITATIONS: meal prep, cleaning, laundry, interpersonal relationship, driving, shopping, community activity, occupation, and yard work  PERSONAL FACTORS: Fitness, Past/current experiences, and 3+ comorbidities: Bradycardia, post-concussion w/LOC,   are also affecting patient's functional outcome.   REHAB POTENTIAL: Good  CLINICAL DECISION MAKING: Unstable/unpredictable  EVALUATION COMPLEXITY: High  PLAN:  PT FREQUENCY: 2x/week  PT DURATION: 4 additional weeks since last seen for therapy   PLANNED INTERVENTIONS: Therapeutic exercises, Therapeutic activity, Neuromuscular re-education, Balance training, Gait training, Patient/Family education, Self Care, Joint mobilization, Stair  training, Vestibular training, Canalith repositioning, DME instructions, Aquatic Therapy, Dry Needling, Electrical stimulation, Manual therapy, and Re-evaluation  PLAN FOR NEXT SESSION: progress occulomotor/vestibular exercises (VORx1 as tolerated, Brock string), progress corner balance (EC on foam, tandem with head turns as able etc), consider neuropthomology referral pending progress; did OT/speech get scheduled, HART chart with balance work, work on Building surveyor with balance training or dynamic gait/balance without AD with visual scanning, midline orientation,   Continue to work on gait without AD and progress higher level balance tasks as able. Pt would like to work on functional hip/LE strength    Lashandra Arauz E Cadynce Garrette, PT, DPT 11/04/2023, 10:17 AM

## 2023-11-05 ENCOUNTER — Ambulatory Visit: Payer: PPO

## 2023-11-05 ENCOUNTER — Encounter: Payer: PPO | Admitting: Occupational Therapy

## 2023-11-05 ENCOUNTER — Other Ambulatory Visit: Payer: Self-pay

## 2023-11-05 NOTE — Progress Notes (Signed)
Cardiology Clinic Note   Patient Name: Gregory [name] Date of Encounter: 11/09/2023  Primary Care Provider:  Ronnald Nian, MD Primary Cardiologist:  Peter Swaziland, MD  Patient Profile    Gregory [name] 72 year old male presents to the clinic today for follow-up evaluation of his RBBB and coronary artery disease.  He is status post pacemaker implant 10/06/2023.  Past Medical History    Past Medical History:  Diagnosis Date   Alcohol abuse    Aortic stenosis    Arthritis    Asthma    exercise indused or pollen-uses inhaler dail yand has rescue inhaler if needed   Bipolar disorder (HCC)    CAD (coronary artery disease)    coronary calcifications 08/2013 CTA   Carotid artery occlusion    Cataract    bilateral sx   Chronic back pain    Claudication (HCC)    Complication of anesthesia 10/16/2020   "had a panic attack when the mask was put on me"" I do not think I was given anything  before, I need something to calm me down"   Depression    Family history of skin cancer    Heart murmur    as an infant   Hepatitis    h/o 1970,doesn't remember which type,1990 epstain-barr   Herpes zoster without complication 09/05/2019   Hyperlipidemia    Hypertension    Neuromuscular disorder (HCC)    Obsessive compulsive disorder    PAD (peripheral artery disease) (HCC)    Peripheral neuropathy    Pneumonia    RBBB    RBBB (right bundle branch block with left anterior fascicular block)    Seasonal allergies    Severe aortic stenosis    Sleep apnea    does not wear CPAP   Smoker    Spinal stenosis at L4-L5 level    Tobacco abuse    Tobacco use disorder    Past Surgical History:  Procedure Laterality Date   ANAL FISTULECTOMY  09/25/2011   AORTA - BILATERAL FEMORAL ARTERY BYPASS GRAFT N/A 10/03/2020   Procedure: AORTA BIFEMORAL BYPASS GRAFT USING A HEMASHIELD GOLD BIFURCATED 14 X 7mm GRAFT AND INFERIOR MESENTERIC ARTERY REIMPLANTATION;  Surgeon: Nada Libman, MD;   Location: [facility] OR;  Service: Vascular;  Laterality: N/A;   AORTIC VALVE REPLACEMENT N/A 11/24/2019   Procedure: AORTIC VALVE REPLACEMENT (AVR) using INSPIRIS Resilia 23 MM Bioprosthetic Aortic Valve.;  Surgeon: Alleen Borne, MD;  Location: [facility] OR;  Service: Open Heart Surgery;  Laterality: N/A;   APPLICATION OF WOUND VAC Left 10/17/2020   Procedure: APPLICATION OF WOUND VAC;  Surgeon: Nada Libman, MD;  Location: [facility] OR;  Service: Vascular;  Laterality: Left;   BACK SURGERY     2015   CATARACT EXTRACTION, BILATERAL  2022   COLONOSCOPY  2009   MS-F/V-mov(exc)-HPP   CORONARY ARTERY BYPASS GRAFT N/A 11/24/2019   Procedure: CORONARY ARTERY BYPASS GRAFTING (CABG) using LIMA to LAD.;  Surgeon: Alleen Borne, MD;  Location: [facility] OR;  Service: Open Heart Surgery;  Laterality: N/A;   ELBOW SURGERY     bilaterally for cubital tunnel   ENDARTERECTOMY N/A 10/03/2020   Procedure: AORTIC ENDARTERECTOMY;  Surgeon: Nada Libman, MD;  Location: [facility];  Service: Vascular;  Laterality: N/A;   ENDARTERECTOMY FEMORAL Bilateral 10/03/2020   Procedure: BILATERAL FEMORAL ENDARTERECTOMY;  Surgeon: Nada Libman, MD;  Location: [facility];  Service: Vascular;  Laterality: Bilateral;   HERNIA REPAIR  with mesh   INCISION AND DRAINAGE Left 01/02/2021   Procedure: INCISION AND DRAINAGE LEFT GROIN WITH STIMULAN BEADS;  Surgeon: Nada Libman, MD;  Location: [facility];  Service: Vascular;  Laterality: Left;   INCISION AND DRAINAGE OF WOUND Left 10/17/2020   Procedure: EXPLORATION LEFT GROIN WOUND;  Surgeon: Nada Libman, MD;  Location: [facility];  Service: Vascular;  Laterality: Left;   MAXIMUM ACCESS (MAS)POSTERIOR LUMBAR INTERBODY FUSION (PLIF) 1 LEVEL N/A 10/25/2014   Procedure: LUMBAR FOUR TO FIVE MAXIMUM ACCESS (MAS) POSTERIOR LUMBAR INTERBODY FUSION (PLIF) 1 LEVEL;  Surgeon: Tia Alert, MD;  Location: [facility];  Service: Neurosurgery;  Laterality: N/A;   PACEMAKER IMPLANT N/A 10/06/2023   Procedure:  PACEMAKER IMPLANT;  Surgeon: Regan Lemming, MD;  Location: [facility] INVASIVE CV LAB;  Service: Cardiovascular;  Laterality: N/A;   RIGHT/LEFT HEART CATH AND CORONARY ANGIOGRAPHY N/A 11/10/2019   Procedure: RIGHT/LEFT HEART CATH AND CORONARY ANGIOGRAPHY;  Surgeon: Swaziland, [name], MD;  Location: [facility] INVASIVE CV LAB;  Service: Cardiovascular;  Laterality: N/A;   SKIN BIOPSY Left 10/12/2018   shave forehead Hypertrophic actinic kertosis with features of a verruca   TEE WITHOUT CARDIOVERSION N/A 11/24/2019   Procedure: TRANSESOPHAGEAL ECHOCARDIOGRAM (TEE);  Surgeon: Alleen Borne, MD;  Location: [facility] OR;  Service: Open Heart Surgery;  Laterality: N/A;   TONSILLECTOMY  age 59   VASECTOMY     X 2   VASECTOMY REVERSAL      Allergies  Allergies  Allergen Reactions   Codeine Anaphylaxis   Dihydrocodeine Anaphylaxis    History of Present Illness    Gregory [name] has a PMH of CABG 12/20, severe aortic valve stenosis status post AVR (23 mm bio prosthetic aortic valve 12/20), peripheral arterial disease status post aorto fem bypass grafting, syncope 9/24 which was complicated by SAH/skull fracture, sinus bradycardia, HTN, HLD,  tobacco abuse, intermittent dizziness, intermittent dropping of heart rate.  He underwent PPM insertion on 10/06/2023.  His pacemaker was inserted by Dr. Elberta Fortis.  Chest x-ray 10/07/2023 showed no pneumothorax status postimplantation.  He tolerated the procedure well.  He was discharged in stable condition on 10/07/2023.  He was seen in follow-up by Robet Leu, PA-C on 09/28/2023.  During that time he noted frequent variations in his heart rate.  He was wondering if his heart rate was contributing to his syncope and fall.  He was wearing his exercise watch.  He noted that his heart rate was routinely around 48 bpm.  He had been watching his heart rate more closely and noted that while he was sleeping his heart rate would be in the 30s.  He reported that with a heart rate  in the low 40s he would feel dizzy lightheaded and weak.  Since he had his fall he had not been as active.  He reported that he had not been as active because neurosurgery had also told him that a subsequent fall with his skull fracture could cause brain damage.  He denied further episodes of syncope.  He was wondering about his history of AV block after surgery.  His RBBB and his low heart rate putting him at risk for passing out and subsequent events.  He presents to the clinic today for follow-up evaluation and states he has been doing physical therapy regularly.  He has ready to start upper extremity resistance training again.  He reports that he has been doing some exercise on his own and getting his heart rate  into the 140s.  We reviewed precautions for gradually increasing physical activity.  He asks about driving restrictions.  He had his initial event September 3.  As long as he has no further episodes of lightheadedness, dizziness, syncope, he may potentially start to drive again in early March.  Will plan follow-up in 6 months.  Today he denies chest pain, shortness of breath, lower extremity edema, fatigue, palpitations, melena, hematuria, hemoptysis, diaphoresis, weakness, presyncope, syncope, orthopnea, and PND.   Home Medications    Prior to Admission medications   Medication Sig Start Date End Date Taking? Authorizing Provider  albuterol (VENTOLIN HFA) 108 (90 Base) MCG/ACT inhaler INHALE TWO PUFFS BY MOUTH EVERY 6 HOURS AS NEEDED FOR WHEEZING 04/16/23   Ronnald Nian, MD  amLODipine (NORVASC) 10 MG tablet Take 1 tablet (10 mg total) by mouth daily. 04/16/23   Ronnald Nian, MD  amoxicillin (AMOXIL) 500 MG capsule Take 4 capsules (2,000 mg total) by mouth once as needed for up to 1 dose. Take 4 tablets one hour before dental work 06/02/22   Swaziland, [name], MD  atorvastatin (LIPITOR) 80 MG tablet Take 1 tablet (80 mg total) by mouth daily. 04/16/23   Ronnald Nian, MD  azelastine  (ASTELIN) 0.1 % nasal spray Place 1-2 sprays into both nostrils 2 (two) times daily as needed (nasal drainage). Use in each nostril as directed 04/16/23   Ronnald Nian, MD  divalproex (DEPAKOTE ER) 500 MG 24 hr tablet Take 1 tablet (500 mg total) by mouth 3 (three) times daily. Patient taking differently: Take 1,000 mg by mouth daily. 04/16/23   Ronnald Nian, MD  finasteride (PROSCAR) 5 MG tablet Take 1 tablet (5 mg total) by mouth daily. 04/16/23   Ronnald Nian, MD  fluticasone-salmeterol (ADVAIR DISKUS) 500-50 MCG/ACT AEPB Inhale 1 puff into the lungs in the morning and at bedtime. 04/16/23   Ronnald Nian, MD  lamoTRIgine (LAMICTAL) 200 MG tablet Take 1 tablet (200 mg total) by mouth every morning. 04/16/23   Ronnald Nian, MD  lisinopril-hydrochlorothiazide (ZESTORETIC) 20-12.5 MG tablet Take 1 tablet by mouth every morning. 04/16/23   Ronnald Nian, MD  nicotine polacrilex (COMMIT) 4 MG lozenge Take 4 mg by mouth as needed for smoking cessation.    [provider]    Family History    Family History  Problem Relation Age of Onset   Colon polyps Mother 100   Heart failure Mother    Stroke Father 51   Cancer Father        skin   Drug abuse Father    Hypertension Brother    Diabetes Brother    Drug abuse Brother    Heart disease Brother        s/p CABG, pacemaker   Cancer Maternal Aunt        skin   Cancer Maternal Grandmother        liver   Colon cancer Neg Hx    Esophageal cancer Neg Hx    Rectal cancer Neg Hx    Stomach cancer Neg Hx    He indicated that his mother is alive. He indicated that his father is deceased. He indicated that both of his brothers are alive. He indicated that the status of his maternal grandmother is unknown. He indicated that the status of his maternal aunt is unknown. He indicated that the status of his neg hx is unknown.  Social History    Social History  Socioeconomic History   Marital status: Married    Spouse name: Not on  file   Number of children: 6   Years of education: Not on file   Highest education level: Not on file  Occupational History   Not on file  Tobacco Use   Smoking status: Every Day    Current packs/day: 0.50    Average packs/day: 0.5 packs/day for 25.0 years (12.5 ttl pk-yrs)    Types: Cigarettes   Smokeless tobacco: Never   Tobacco comments:    Pt had stopped smoking X10 years/ currently smoking a 1.5 pack a day   Vaping Use   Vaping status: Never Used  Substance and Sexual Activity   Alcohol use: Not Currently    Comment: 06/26/2014- last drink- AA   Drug use: No    Comment: former alcoholic   Sexual activity: Yes  Other Topics Concern   Not on file  Social History Narrative   Lives in [place] with wife.  Does not routinely exercise.  Sedentary r/t chronic lbp.   Social Determinants of Health   Financial Resource Strain: Low Risk  (04/10/2023)   Overall Financial Resource Strain (CARDIA)    Difficulty of Paying Living Expenses: Not very hard  Food Insecurity: No Food Insecurity (10/23/2023)   Hunger Vital Sign    Worried About Running Out of Food in the Last Year: Never true    Ran Out of Food in the Last Year: Never true  Transportation Needs: No Transportation Needs (10/23/2023)   PRAPARE - Administrator, Civil Service (Medical): No    Lack of Transportation (Non-Medical): No  Physical Activity: Sufficiently Active (04/10/2023)   Exercise Vital Sign    Days of Exercise per Week: 4 days    Minutes of Exercise per Session: 150+ min  Stress: Stress Concern Present (04/10/2023)   [employer] of Occupational Health - Occupational Stress Questionnaire    Feeling of Stress : To some extent  Social Connections: Unknown (04/10/2023)   Social Connection and Isolation Panel [NHANES]    Frequency of Communication with Friends and Family: Once a week    Frequency of Social Gatherings with Friends and Family: Never    Attends Religious Services: Not on Administrator, sports or Organizations: Yes    Attends Banker Meetings: More than 4 times per year    Marital Status: Married  Catering manager Violence: Not At Risk (10/23/2023)   Humiliation, Afraid, Rape, and Kick questionnaire    Fear of Current or Ex-Partner: No    Emotionally Abused: No    Physically Abused: No    Sexually Abused: No     Review of Systems    General:  No chills, fever, night sweats or weight changes.  Cardiovascular:  No chest pain, dyspnea on exertion, edema, orthopnea, palpitations, paroxysmal nocturnal dyspnea. Dermatological: No rash, lesions/masses Respiratory: No cough, dyspnea Urologic: No hematuria, dysuria Abdominal:   No nausea, vomiting, diarrhea, bright red blood per rectum, melena, or hematemesis Neurologic:  No visual changes, wkns, changes in mental status. All other systems reviewed and are otherwise negative except as noted above.  Physical Exam    VS:  BP 124/72 (BP Location: Left Arm, Patient Position: Sitting, Cuff Size: Normal)   Pulse 60   Ht 5\' 5"  (1.651 m)   Wt 137 lb (62.1 kg)   SpO2 97%   BMI 22.80 kg/m  , BMI Body mass index is 22.8 kg/m. GEN:  Well nourished, well developed, in no acute distress. HEENT: normal. Neck: Supple, no JVD, carotid bruits, or masses. Cardiac: RRR, no murmurs, rubs, or gallops. No clubbing, cyanosis, edema.  Radials/DP/PT 2+ and equal bilaterally.  Respiratory:  Respirations regular and unlabored, clear to auscultation bilaterally. GI: Soft, nontender, nondistended, BS + x 4. MS: no deformity or atrophy. Skin: warm and dry, no rash. Neuro:  Strength and sensation are intact. Psych: Normal affect.  Accessory Clinical Findings    Recent Labs: 09/28/2023: Magnesium 2.2; TSH 1.860 10/05/2023: Hemoglobin 14.2; Platelets 143 10/06/2023: ALT 28; BUN 13; Creatinine, Ser 1.01; Potassium 4.5; Sodium 140   Recent Lipid Panel    Component Value Date/Time   CHOL 110 04/16/2023 1146   TRIG 98  04/16/2023 1146   HDL 41 04/16/2023 1146   CHOLHDL 2.7 04/16/2023 1146   CHOLHDL 5.1 (H) 06/29/2017 1534   VLDL 45 (H) 06/29/2017 1534   LDLCALC 50 04/16/2023 1146         ECG personally reviewed by me today- none today.     Echocardiogram 10/19/2023  IMPRESSIONS     1. Left ventricular ejection fraction, by estimation, is 60 to 65%. The  left ventricle has normal function. The left ventricle has no regional  wall motion abnormalities. Left ventricular diastolic parameters are  consistent with Grade I diastolic  dysfunction (impaired relaxation). The average left ventricular global  longitudinal strain is -21.0 %. The global longitudinal strain is normal.   2. Right ventricular systolic function is normal. The right ventricular  size is normal. Tricuspid regurgitation signal is inadequate for assessing  PA pressure.   3. The mitral valve is normal in structure. No evidence of mitral valve  regurgitation. No evidence of mitral stenosis.   4. Bioprosthetic aortic valve. Mean gradient 9 mmHg, no significant  stenosis. No peri-valvular leakage noted.   5. The inferior vena cava is normal in size with greater than 50%  respiratory variability, suggesting right atrial pressure of 3 mmHg.   FINDINGS   Left Ventricle: Left ventricular ejection fraction, by estimation, is 60  to 65%. The left ventricle has normal function. The left ventricle has no  regional wall motion abnormalities. The average left ventricular global  longitudinal strain is -21.0 %.  The global longitudinal strain is normal. The left ventricular internal  cavity size was normal in size. There is no left ventricular hypertrophy.  Left ventricular diastolic parameters are consistent with Grade I  diastolic dysfunction (impaired  relaxation).   Right Ventricle: The right ventricular size is normal. No increase in  right ventricular wall thickness. Right ventricular systolic function is  normal. Tricuspid  regurgitation signal is inadequate for assessing PA  pressure. The tricuspid regurgitant  velocity is 2.77 m/s, and with an assumed right atrial pressure of 3 mmHg,  the estimated right ventricular systolic pressure is 33.7 mmHg.   Left Atrium: Left atrial size was normal in size.   Right Atrium: Right atrial size was normal in size.   Pericardium: There is no evidence of pericardial effusion.   Mitral Valve: The mitral valve is normal in structure. Mild to moderate  mitral annular calcification. No evidence of mitral valve regurgitation.  No evidence of mitral valve stenosis.   Tricuspid Valve: The tricuspid valve is normal in structure. Tricuspid  valve regurgitation is mild.   Aortic Valve: Bioprosthetic aortic valve. Mean gradient 9 mmHg, no  significant stenosis. No peri-valvular leakage noted. The aortic valve has  been repaired/replaced. Aortic valve regurgitation is  not visualized.  Aortic valve mean gradient measures 9.0  mmHg. Aortic valve peak gradient measures 20.2 mmHg.   Pulmonic Valve: The pulmonic valve was normal in structure. Pulmonic valve  regurgitation is not visualized.   Aorta: The aortic root is normal in size and structure.   Venous: The inferior vena cava is normal in size with greater than 50%  respiratory variability, suggesting right atrial pressure of 3 mmHg.   IAS/Shunts: No atrial level shunt detected by color flow Doppler.   Additional Comments: A device lead is visualized in the right ventricle.      Assessment & Plan   1.  Symptomatic bradycardia-status post PPM insertion on 10/06/2023.  Surgical incision healed well.  Denies further episodes of lightheadedness, presyncope or syncope.  Heart rate today 60 bpm. Follows with the EP Continue to increase physical activity-may start range of motion upper extremity exercises.  In 2 weeks may start with 10 pounds and progress no more than 10 %/week. Reviewed precautions for no driving 6 months  post initial event-placed December around March 3 to be able to drive.  Coronary artery disease-denies chest pain.  Status post CABG and bioprosthetic AVR 12/20.  No aspirin due to subarachnoid hemorrhage 9/24. Heart healthy low-sodium diet Continue atorvastatin, lisinopril, hydrochlorothiazide  Essential hypertension-BP today 124/72. Maintain blood pressure log Continue current medical therapy Heart healthy low-sodium diet  Peripheral arterial disease-denies claudication.  Status post aortofem bypass grafting.  ABI 11/27/2022 showed normal ABI index Follows with vascular surgery Continue statin therapy  Asthma-breathing at baseline. Follows with PCP  Disposition: Follow-up with Dr. Swaziland or me in 4 to 6 months.   Thomasene Ripple. Dona Walby NP-C     11/09/2023, 10:42 AM [facility] [address] Office [phone] Fax [phone]    I spent 14 minutes examining this patient, reviewing medications, and using patient centered shared decision making involving her cardiac care.   I spent greater than 20 minutes reviewing her past medical history,  medications, and prior cardiac tests.

## 2023-11-05 NOTE — Patient Outreach (Addendum)
Care Management  Transitions of Care Program Transitions of Care Post-discharge Program Completion   11/05/2023 Name: Gregory [name] MRN: [id number] DOB: [birth year]  Subjective: Gregory [name] is a 72 y.o. year old male who is a primary care patient of Ronnald Nian, MD. The Care Management team Engaged with patient Engaged with patient by telephone to assess and address transitions of care needs.   Consent to Services:  Patient was given information about care management services, agreed to services, and gave verbal consent to participate.   Assessment:   Patient voices no new complaints Patient has not developed/ reported any new Medical issues / Dx or acute changes.- since last follow-up call for most recent  Hospital stay     10-04-09-16 / 2024  Doing well is cleared by Cardiology. Next cardiology appt 11/18 He will Continue lifting restrictions until 11/28.Follow-up visit with  Neurosurgeon   ( s/p skull fracture) continue precautions until 12/10- finish therapy visits. Concerns r/t dizziness,need to ensure he has some accommodations / limitations then may return to work. He has discussed this with Karin Golden .He would like to return to work.He is going to gym,Using Treadmill and riding bike. He monitors his HR using a Pulse Ox  during exertion -high is 130 His pacemaker is functioning as expected . He has always been physically active and is socially active enjoys above( gym and work ) and [employer].He has decreased smoking , and continues to work on this  Patient educated on red flags s/s to watch for and was encouraged to report any of these identified , any new symptoms , changes in baseline or  medication regimen,  change in health status  /  well-being, or safety concerns to PCP and / or the  [facility] Case Management team .          SDOH Interventions    Flowsheet Row Patient Outreach from 10/23/2023 in [place] [facility] Telephone from  10/08/2023 in [place] [facility] Clinical Support from 04/14/2023 in [facility] Care Coordination from 02/12/2023 in [facility] Clinical Support from 04/11/2022 in [facility] Chronic Care Management from 11/07/2021 in [facility]  SDOH Interventions        Food Insecurity Interventions Intervention Not Indicated -- Intervention Not Indicated Intervention Not Indicated -- --  Housing Interventions Intervention Not Indicated -- Intervention Not Indicated Intervention Not Indicated -- --  Transportation Interventions Intervention Not Indicated, Patient Resources Dietitian) -- Intervention Not Indicated Intervention Not Indicated -- Intervention Not Indicated  Utilities Interventions Intervention Not Indicated -- Intervention Not Indicated Intervention Not Indicated -- --  Alcohol Usage Interventions -- -- Intervention Not Indicated (Score <7) Intervention Not Indicated (Score <7) -- --  Depression Interventions/Treatment  -- -- NWG9-5 Score <4 Follow-up Not Indicated -- PHQ2-9 Score <4 Follow-up Not Indicated --  Financial Strain Interventions -- -- Intervention Not Indicated Intervention Not Indicated -- Intervention Not Indicated  Physical Activity Interventions -- -- Intervention Not Indicated Intervention Not Indicated -- --  Stress Interventions -- -- Patient Refused -- -- --  Social Connections Interventions -- -- Intervention Not Indicated -- -- --  Health Literacy Interventions -- Intervention Not Indicated -- -- -- --        Goals Addressed             This Visit's Progress    COMPLETED: TOC Care Plan       Current Barriers:  Medication management No missed  medications, will report any adverse side effects Provider appointments Cardiology, Neurology- and outpatient PT/OT SLP  RNCM Clinical Goal(s):  Patient will verbalize understanding of plan for management of Atrial Fibrillation as evidenced by heart  rate will remain between 60-100- occ tachy 108 Pacemaker check rates 59-120   take all medications exactly as prescribed and will call provider for medication related questions as evidenced by no unreported adverse side effect or unmanaged symptoms  attend all scheduled medical appointments: Cardiology, Neurology and PCP  as evidenced by no missed appointments , labs or testing   through collaboration with RN Care manager, provider, and care team.   Interventions: Evaluation of current treatment plan related to  self management and patient's adherence to plan as established by provider  Transitions of Care:  Goal Met. Doctor Visits  - discussed the importance of doctor visits- cardio 10/28, then 11/18 Wound check s/p PPM implant 10/30- area healed .  Cardiologist cleared to go  back to work but waiting til Thanksgiving, going to gym, Received Flu vaccine  Patient Goals/Self-Care Activities: Participate in Transition of Care Program/Attend TOC scheduled calls Take all medications as prescribed Attend all scheduled provider appointments Call pharmacy for medication refills 3-7 days in advance of running out of medications Perform all self care activities independently  Perform IADL's (shopping, preparing meals, housekeeping, managing finances) independently Call provider office for new concerns or questions   Follow Up Plan:  The patient has been provided with contact information for the care management team and has been advised to call with any health related questions or concerns.          Plan:  The patient has successfully completed the 30-day TOC Program. Condition is stable No further acute needs identified at this time. Chronic conditions and ongoing care is  managed thru collaboration with  PCP,  Specialists and additional Healthcare Providers if indicated . Patient verbalized understanding of ongoing plan of care.  SDOH needs have been screened and interventions provided if  identified.  Reviewed current home medications -- provided education as needed.  Previously Discussed rationale of use, how/when to take medications. Patient is aware of potential side effects, and was encouraged to notify PCP for any changes in condition or signs / symptoms not relieved  with interventions.   Patient will call 911 for Medical Emergencies or Life -Threatening or report to a local emergency department or urgent care.   Patient was encouraged to Contact PCP  with any questions or concerns regarding ongoing  medical care, any  difficulty obtaining or picking up  prescriptions, any  changes or  worsening in  condition including signs / symptoms not relieved  with interventions  Patient had no additional questions or concerns at this time. Current needs addressed.   The patient has been provided with contact information for the care management team and has been advised to call with any health related questions or concerns.   [name] , BSN, RN Care Management Coordinator [place]   [facility] [email or web address] Direct Dial: [phone]

## 2023-11-09 ENCOUNTER — Encounter: Payer: PPO | Admitting: Occupational Therapy

## 2023-11-09 ENCOUNTER — Ambulatory Visit: Payer: PPO | Attending: General Practice | Admitting: General Practice

## 2023-11-09 ENCOUNTER — Ambulatory Visit: Payer: PPO

## 2023-11-09 ENCOUNTER — Encounter: Payer: Self-pay | Admitting: General Practice

## 2023-11-09 ENCOUNTER — Ambulatory Visit: Payer: PPO | Admitting: Physical Therapy

## 2023-11-09 VITALS — BP 124/72 | HR 60 | Ht 65.0 in | Wt 137.0 lb

## 2023-11-09 DIAGNOSIS — I2510 Atherosclerotic heart disease of native coronary artery without angina pectoris: Secondary | ICD-10-CM

## 2023-11-09 DIAGNOSIS — M6281 Muscle weakness (generalized): Secondary | ICD-10-CM

## 2023-11-09 DIAGNOSIS — Z951 Presence of aortocoronary bypass graft: Secondary | ICD-10-CM | POA: Diagnosis not present

## 2023-11-09 DIAGNOSIS — J4530 Mild persistent asthma, uncomplicated: Secondary | ICD-10-CM | POA: Diagnosis not present

## 2023-11-09 DIAGNOSIS — I739 Peripheral vascular disease, unspecified: Secondary | ICD-10-CM | POA: Diagnosis not present

## 2023-11-09 DIAGNOSIS — R2681 Unsteadiness on feet: Secondary | ICD-10-CM

## 2023-11-09 DIAGNOSIS — R001 Bradycardia, unspecified: Secondary | ICD-10-CM

## 2023-11-09 DIAGNOSIS — R41841 Cognitive communication deficit: Secondary | ICD-10-CM

## 2023-11-09 DIAGNOSIS — I10 Essential (primary) hypertension: Secondary | ICD-10-CM | POA: Diagnosis not present

## 2023-11-09 DIAGNOSIS — R2689 Other abnormalities of gait and mobility: Secondary | ICD-10-CM

## 2023-11-09 NOTE — Therapy (Signed)
OUTPATIENT SPEECH LANGUAGE PATHOLOGY TREATMENT   Patient Name: Gregory [name] MRN: [id number] DOB:[birth year], 72 y.o., male Today's Date: 11/09/2023  PCP: Ronnald Nian, MD REFERRING PROVIDER: Ronnald Nian, MD  END OF SESSION:  End of Session - 11/09/23 1413     Visit Number 7    Number of Visits 18    Date for SLP Re-Evaluation 11/25/23    SLP Start Time 1234    SLP Stop Time  1315    SLP Time Calculation (min) 41 min    Activity Tolerance Patient tolerated treatment well                 Past Medical History:  Diagnosis Date   Alcohol abuse    Aortic stenosis    Arthritis    Asthma    exercise indused or pollen-uses inhaler dail yand has rescue inhaler if needed   Bipolar disorder (HCC)    CAD (coronary artery disease)    coronary calcifications 08/2013 CTA   Carotid artery occlusion    Cataract    bilateral sx   Chronic back pain    Claudication (HCC)    Complication of anesthesia 10/16/2020   "had a panic attack when the mask was put on me"" I do not think I was given anything  before, I need something to calm me down"   Depression    Family history of skin cancer    Heart murmur    as an infant   Hepatitis    h/o 1970,doesn't remember which type,1990 epstain-barr   Herpes zoster without complication 09/05/2019   Hyperlipidemia    Hypertension    Neuromuscular disorder (HCC)    Obsessive compulsive disorder    PAD (peripheral artery disease) (HCC)    Peripheral neuropathy    Pneumonia    RBBB    RBBB (right bundle branch block with left anterior fascicular block)    Seasonal allergies    Severe aortic stenosis    Sleep apnea    does not wear CPAP   Smoker    Spinal stenosis at L4-L5 level    Tobacco abuse    Tobacco use disorder    Past Surgical History:  Procedure Laterality Date   ANAL FISTULECTOMY  09/25/2011   AORTA - BILATERAL FEMORAL ARTERY BYPASS GRAFT N/A 10/03/2020   Procedure: AORTA BIFEMORAL BYPASS GRAFT USING A  HEMASHIELD GOLD BIFURCATED 14 X 7mm GRAFT AND INFERIOR MESENTERIC ARTERY REIMPLANTATION;  Surgeon: Nada Libman, MD;  Location: [facility] OR;  Service: Vascular;  Laterality: N/A;   AORTIC VALVE REPLACEMENT N/A 11/24/2019   Procedure: AORTIC VALVE REPLACEMENT (AVR) using INSPIRIS Resilia 23 MM Bioprosthetic Aortic Valve.;  Surgeon: Alleen Borne, MD;  Location: [facility] OR;  Service: Open Heart Surgery;  Laterality: N/A;   APPLICATION OF WOUND VAC Left 10/17/2020   Procedure: APPLICATION OF WOUND VAC;  Surgeon: Nada Libman, MD;  Location: [facility] OR;  Service: Vascular;  Laterality: Left;   BACK SURGERY     2015   CATARACT EXTRACTION, BILATERAL  2022   COLONOSCOPY  2009   MS-F/V-mov(exc)-HPP   CORONARY ARTERY BYPASS GRAFT N/A 11/24/2019   Procedure: CORONARY ARTERY BYPASS GRAFTING (CABG) using LIMA to LAD.;  Surgeon: Alleen Borne, MD;  Location: [facility] OR;  Service: Open Heart Surgery;  Laterality: N/A;   ELBOW SURGERY     bilaterally for cubital tunnel   ENDARTERECTOMY N/A 10/03/2020   Procedure: AORTIC ENDARTERECTOMY;  Surgeon: Nada Libman, MD;  Location:  [facility] OR;  Service: Vascular;  Laterality: N/A;   ENDARTERECTOMY FEMORAL Bilateral 10/03/2020   Procedure: BILATERAL FEMORAL ENDARTERECTOMY;  Surgeon: Nada Libman, MD;  Location: [facility] OR;  Service: Vascular;  Laterality: Bilateral;   HERNIA REPAIR     with mesh   INCISION AND DRAINAGE Left 01/02/2021   Procedure: INCISION AND DRAINAGE LEFT GROIN WITH STIMULAN BEADS;  Surgeon: Nada Libman, MD;  Location: [facility] OR;  Service: Vascular;  Laterality: Left;   INCISION AND DRAINAGE OF WOUND Left 10/17/2020   Procedure: EXPLORATION LEFT GROIN WOUND;  Surgeon: Nada Libman, MD;  Location: [facility] OR;  Service: Vascular;  Laterality: Left;   MAXIMUM ACCESS (MAS)POSTERIOR LUMBAR INTERBODY FUSION (PLIF) 1 LEVEL N/A 10/25/2014   Procedure: LUMBAR FOUR TO FIVE MAXIMUM ACCESS (MAS) POSTERIOR LUMBAR INTERBODY FUSION (PLIF) 1 LEVEL;  Surgeon: Tia Alert,  MD;  Location: [facility] NEURO [facility]S;  Service: Neurosurgery;  Laterality: N/A;   PACEMAKER IMPLANT N/A 10/06/2023   Procedure: PACEMAKER IMPLANT;  Surgeon: Regan Lemming, MD;  [facility]: [facility] INVASIVE CV LAB;  Service: Cardiovascular;  Laterality: N/A;   RIGHT/LEFT HEART CATH AND CORONARY ANGIOGRAPHY N/A 11/10/2019   Procedure: RIGHT/LEFT HEART CATH AND CORONARY ANGIOGRAPHY;  Surgeon: Swaziland, [name], MD;  Location: [facility] INVASIVE CV LAB;  Service: Cardiovascular;  Laterality: N/A;   SKIN BIOPSY Left 10/12/2018   shave forehead Hypertrophic actinic kertosis with features of a verruca   TEE WITHOUT CARDIOVERSION N/A 11/24/2019   Procedure: TRANSESOPHAGEAL ECHOCARDIOGRAM (TEE);  Surgeon: Alleen Borne, MD;  Location: [facility] OR;  Service: Open Heart Surgery;  Laterality: N/A;   TONSILLECTOMY  age 48   VASECTOMY     X 2   VASECTOMY REVERSAL     Patient Active Problem List   Diagnosis Date Noted   Symptomatic bradycardia 10/05/2023   Subarachnoid hemorrhage following injury (HCC) 08/26/2023   Skull fracture with cerebral contusion (HCC) 08/26/2023   Temporal bone fracture (HCC) 08/26/2023   Aortic atherosclerosis (HCC) 05/26/2023   Acute non-recurrent pansinusitis 01/04/2023   Perennial allergic rhinitis 07/09/2022   Asthma-COPD overlap syndrome (HCC) 04/21/2022   Recurrent infections 04/21/2022   S/P aortobifemoral bypass surgery 04/03/2021   S/P CABG x 1 11/24/2019   S/P aortic valve replacement with bioprosthetic valve 11/24/2019   PAD (peripheral artery disease) (HCC)    Mild persistent asthma 10/29/2019   RBBB (right bundle branch block with left anterior fascicular block)    Peripheral neuropathy    S/P lumbar spinal fusion 10/25/2014   Recovering alcoholic in remission (HCC) 08/10/2014   Substance induced mood disorder (HCC) 06/30/2014   Substance-induced sleep disorder (HCC) 06/30/2014   Obsessive compulsive disorder    OSA (obstructive sleep apnea) 01/20/2014   CAD (coronary artery  disease)    Mentally disabled 11/25/2012   Hyperlipidemia with target LDL less than 130 11/25/2012   Bipolar disorder (HCC)    Hypertension    Allergic rhinitis due to pollen 08/11/2011    ONSET DATE: 08/25/23   REFERRING DIAG: X91.9X1D (ICD-10-CM) - Traumatic brain injury, with loss of consciousness of 30 minutes or less, subsequent encounter R41.3 (ICD-10-CM) - Memory deficit  THERAPY DIAG: Cognitive communication deficit  Rationale for Evaluation and Treatment: Rehabilitation  SUBJECTIVE:   SUBJECTIVE STATEMENT: "things are going well" Pt accompanied by: significant other Chales Abrahams  PERTINENT HISTORY: Pt reports long history of cardiac issues/dizziness, but has never lost consciousness. Pt reports he felt dizzy and was reaching up at an aisle in [employer]. Lost consciousness  and fell backwards onto the concrete floor and his his head. Does not know how long he was unconscious, was found by another shopper. Has a history of bradycardia, wore a heart monitor for 2 weeks and was cleared by his cardiologist in August to exercise prior to this incident. Works as a Investment banker, operational at [employer] and is used to weightlifting/cardio several times per week.   PAIN:  Are you having pain? No  FALLS: Has patient fallen in last 6 months?  See PT evaluation for details  LIVING ENVIRONMENT: Lives with: lives with their spouse Lives in: House/apartment  PLOF:  Level of assistance: Independent with ADLs, Independent with IADLs Employment: Part-time employment  PATIENT GOALS: "To go back to work and drive"  OBJECTIVE:   TODAY'S TREATMENT:                                                                                                                                         11/09/23: Pt provided thorough verbal recall of recent SLP and MD recommendations. Denied any overt cognitive concerns for upcoming return to work. Recommended pro/con list for return to work date based on wife concerns. Able to  demonstrate adequate executive functioning and awareness related to possible work-related challenges. Recommended implementing strategies/modifications as needed if/when cognitive challenges occur. Pt is pleased with current level of functioning with plan to d/c next session.   11/04/23: Continued discussion of potential cognitive compensations to aid return to work. Targeted anticipatory awareness and safety judgement of work tasks. With occasional min A, pt identified potential limitations at work (physical or cognitive) and determined reasonable accommodations (e.g., having stool nearby to sit as needed). SLP recommended having all accommodations explicitly written by doctor and pt agreed. In simulated work activity, pt demonstrated alternating attention to complete task with independent awareness of need to double check.   11/02/23: Discussed pt desire to return to work and the cognitive skills required for successful return. Current medical recommendation indicating return in ~4 weeks but pt would like to return sooner if possible pending medical clearance. Targeted meta-analysis of work tasks, cognitive sequencing for select tasks, and possible modifications needed to optimize success. Adequate safety awareness noted when typical culinary safety concerns discussed. Able to ID accommodation x3 to account for some physical limitations (I.e., weight bearing restrictions) and novel event planning (ex: using visual supports) but exhibited reduced anticipatory awareness of overall significant increase in physical load (I.e., standing for 6 hours) when wife detailed her concerns. Continue to target emergent/anticipatory awareness, safety judgement, and task completion in upcoming sessions if applicable.   10/28/23: Pt reports overall cognitive improvement with home-based tasks. He accurately recalled and implemented trained techniques discussed last session. Endorsed attention challenges completing entirety of  task before moving on to the next (ex: putting items used to make lunch away). SLP A pt in problem-solving strategies to remind himself to return to task  with min prompting. Pt questioned activities to increase cognitive functioning. SLP provided handout and educated pt on the "mind diet" and activities to promote brain health. Wife reports concern for pt's desire to return to work due to current cognitive skills. Plan to address next session.   10/21/23: Endorsed feeling frustrated with management of fluctuating schedule, numerous appointments, and health journey in general. Demonstrated ability to re-frame and reduce frustration re: therapeutic process and recovery expectations, which has aided mental clarity. Identified he was previously successful when structure and rigid framework in place (ex: work) versus daily schedule differences. Recommended using prior strengths to modify/compensate for current cognitive challenges, such as using visual aids and verbal sequencing to aid retention and attention. Endorsed some difficulty mult-taking at home, resulting in deviations in attention and forgetting to use walker. Targeted meta-cognitive analysis and cognitive strategy selection with pt able to ID appropriate solution with min prompting. Pt able demo and carryover targeted solution (enclosing self with walker) with rare min A in stimulating environment. Reported overall good carryover of targeted recommendations from last session, which aided safety at home.    10/14/23: Pt experienced medical event since prior evaluation session and had a pacemaker implanted. Endorses use of walker and limited challenges, although wife reports otherwise. Wife stated that pt is not using walker consistently, such as walking by it when leaving couch, leaving it in one kitchen door and exiting out the other, and not bringing it to the bathroom. Pt agreed with wife's statements and commented "out of sight out of mind." SLP led pt  and wife in generating functional solutions, with wife stating she can place chair in second kitchen doorway as a visual cue to use the other one (where walker is placed). Additionally SLP recommended keeping walker within a couple feet at all times, such as when watching tv on the couch. Pt reports returning to simple cooking tasks with wife assisting with cutting, as pt cannot use knife. Demonstrated anticipatory awareness skills when noting that 3-minutes was too long for toast (it burned) and that he would set the timer for less next time.   09/30/23: Reviewed goals - eval only   PATIENT EDUCATION: Education details: See Today's Treatment, See Patient Instructions, compensations for cognition, accommodations in workplace Person educated: Patient and Spouse Education method: Explanation and Verbal cues Education comprehension: verbal cues required and needs further education   GOALS: Goals reviewed with patient? Yes  SHORT TERM GOALS: Target date: 10/28/23  Pt will carryover 2 strategies for attention to successfully carryover household tasks that require alternating attention with supervision cues.  Baseline: Goal status: MET  2.  Pt will make safe decisions at home during household tasks with rare min A Baseline:  Goal status: MET  3.  Pt will verbalize 3 compensations for attention when driving (for if/when he is cleared my MD to drive) Baseline:  Goal status: DEFERRED   4. Pt will verbalize safety awareness in kitchen, garden, and work with rare min A             Goal status: MET   LONG TERM GOALS: Target date: 11/25/23  Pt will carryover compensations for attention on divided attention tasks at home Baseline:  Goal status: MET  2.  Pt will verbalize 3 strategies to support alternating and divided attention at work with rare min A Baseline:  Goal status: MET  3.  Pt will verbalize 3 accommodations to support cognition and energy conservation upon return to  work Baseline:  Goal status: IN PROGRESS  ASSESSMENT:  CLINICAL IMPRESSION: Patient is a 72 y.o. male who was seen today for cognition s/p TBI and fall. SLP conducted ongoing education of cognitive strategies and assisted in problem-solving and strategy selection for work-related tasks. Pt will continue to benefit from ST to maximize safety for possible return to work, household tasks, and possible return to driving.   OBJECTIVE IMPAIRMENTS: include attention and awareness. These impairments are limiting patient from return to work and household responsibilities. Factors affecting potential to achieve goals and functional outcome are  n/a .[name] Patient will benefit from skilled SLP services to address above impairments and improve overall function.  REHAB POTENTIAL: Good  PLAN:  SLP FREQUENCY: 2x/week  SLP DURATION: 8 weeks  PLANNED INTERVENTIONS: Environmental controls, Cueing hierachy, Cognitive reorganization, Internal/external aids, Functional tasks, and Multimodal communication approach    Gracy Racer, CCC-SLP 11/09/2023, 2:14 PM

## 2023-11-09 NOTE — Therapy (Signed)
OUTPATIENT PHYSICAL THERAPY NEURO TREATMENT   Patient Name: Gregory [name] MRN: [id number] DOB:[birth year], 72 y.o., male Today's Date: 11/09/2023   PCP: Ronnald Nian, MD REFERRING PROVIDER: Willeen Niece, MD  END OF SESSION:  PT End of Session - 11/09/23 1321     Visit Number 17    Number of Visits 18    Date for PT Re-Evaluation 12/07/23    Authorization Type [employer]    PT Start Time 1319    PT Stop Time 1359    PT Time Calculation (min) 40 min    Equipment Utilized During Treatment --    Activity Tolerance Patient tolerated treatment well    Behavior During Therapy WFL for tasks assessed/performed               Past Medical History:  Diagnosis Date   Alcohol abuse    Aortic stenosis    Arthritis    Asthma    exercise indused or pollen-uses inhaler dail yand has rescue inhaler if needed   Bipolar disorder (HCC)    CAD (coronary artery disease)    coronary calcifications 08/2013 CTA   Carotid artery occlusion    Cataract    bilateral sx   Chronic back pain    Claudication (HCC)    Complication of anesthesia 10/16/2020   "had a panic attack when the mask was put on me"" I do not think I was given anything  before, I need something to calm me down"   Depression    Family history of skin cancer    Heart murmur    as an infant   Hepatitis    h/o 1970,doesn't remember which type,1990 epstain-barr   Herpes zoster without complication 09/05/2019   Hyperlipidemia    Hypertension    Neuromuscular disorder (HCC)    Obsessive compulsive disorder    PAD (peripheral artery disease) (HCC)    Peripheral neuropathy    Pneumonia    RBBB    RBBB (right bundle branch block with left anterior fascicular block)    Seasonal allergies    Severe aortic stenosis    Sleep apnea    does not wear CPAP   Smoker    Spinal stenosis at L4-L5 level    Tobacco abuse    Tobacco use disorder    Past Surgical History:  Procedure Laterality Date   ANAL  FISTULECTOMY  09/25/2011   AORTA - BILATERAL FEMORAL ARTERY BYPASS GRAFT N/A 10/03/2020   Procedure: AORTA BIFEMORAL BYPASS GRAFT USING A HEMASHIELD GOLD BIFURCATED 14 X 7mm GRAFT AND INFERIOR MESENTERIC ARTERY REIMPLANTATION;  Surgeon: Nada Libman, MD;  Location: [facility] OR;  Service: Vascular;  Laterality: N/A;   AORTIC VALVE REPLACEMENT N/A 11/24/2019   Procedure: AORTIC VALVE REPLACEMENT (AVR) using INSPIRIS Resilia 23 MM Bioprosthetic Aortic Valve.;  Surgeon: Alleen Borne, MD;  Location: [facility] OR;  Service: Open Heart Surgery;  Laterality: N/A;   APPLICATION OF WOUND VAC Left 10/17/2020   Procedure: APPLICATION OF WOUND VAC;  Surgeon: Nada Libman, MD;  Location: [facility] OR;  Service: Vascular;  Laterality: Left;   BACK SURGERY     2015   CATARACT EXTRACTION, BILATERAL  2022   COLONOSCOPY  2009   MS-F/V-mov(exc)-HPP   CORONARY ARTERY BYPASS GRAFT N/A 11/24/2019   Procedure: CORONARY ARTERY BYPASS GRAFTING (CABG) using LIMA to LAD.;  Surgeon: Alleen Borne, MD;  Location: [facility] OR;  Service: Open Heart Surgery;  Laterality: N/A;   ELBOW SURGERY  bilaterally for cubital tunnel   ENDARTERECTOMY N/A 10/03/2020   Procedure: AORTIC ENDARTERECTOMY;  Surgeon: Nada Libman, MD;  Location: [facility];  Service: Vascular;  Laterality: N/A;   ENDARTERECTOMY FEMORAL Bilateral 10/03/2020   Procedure: BILATERAL FEMORAL ENDARTERECTOMY;  Surgeon: Nada Libman, MD;  Location: [facility];  Service: Vascular;  Laterality: Bilateral;   HERNIA REPAIR     with mesh   INCISION AND DRAINAGE Left 01/02/2021   Procedure: INCISION AND DRAINAGE LEFT GROIN WITH STIMULAN BEADS;  Surgeon: Nada Libman, MD;  Location: [facility];  Service: Vascular;  Laterality: Left;   INCISION AND DRAINAGE OF WOUND Left 10/17/2020   Procedure: EXPLORATION LEFT GROIN WOUND;  Surgeon: Nada Libman, MD;  Location: [facility];  Service: Vascular;  Laterality: Left;   MAXIMUM ACCESS (MAS)POSTERIOR LUMBAR INTERBODY FUSION (PLIF) 1 LEVEL N/A  10/25/2014   Procedure: LUMBAR FOUR TO FIVE MAXIMUM ACCESS (MAS) POSTERIOR LUMBAR INTERBODY FUSION (PLIF) 1 LEVEL;  Surgeon: Tia Alert, MD;  Location: [facility] NEURO [facility];  Service: Neurosurgery;  Laterality: N/A;   PACEMAKER IMPLANT N/A 10/06/2023   Procedure: PACEMAKER IMPLANT;  Surgeon: Regan Lemming, MD;  Location: [facility] INVASIVE CV [facility];  Service: Cardiovascular;  Laterality: N/A;   RIGHT/LEFT HEART CATH AND CORONARY ANGIOGRAPHY N/A 11/10/2019   Procedure: RIGHT/LEFT HEART CATH AND CORONARY ANGIOGRAPHY;  Surgeon: Swaziland, [name], MD;  Location[facility] INVASIVE CV LAB;  Service: Cardiovascular;  Laterality: N/A;   SKIN BIOPSY Left 10/12/2018   shave forehead Hypertrophic actinic kertosis with features of a verruca   TEE WITHOUT CARDIOVERSION N/A 11/24/2019   Procedure: TRANSESOPHAGEAL ECHOCARDIOGRAM (TEE);  Surgeon: Alleen Borne, MD;  Location: [facility] OR;  Service: Open Heart Surgery;  Laterality: N/A;   TONSILLECTOMY  age 43   VASECTOMY     X 2   VASECTOMY REVERSAL     Patient Active Problem List   Diagnosis Date Noted   Symptomatic bradycardia 10/05/2023   Subarachnoid hemorrhage following injury (HCC) 08/26/2023   Skull fracture with cerebral contusion (HCC) 08/26/2023   Temporal bone fracture (HCC) 08/26/2023   Aortic atherosclerosis (HCC) 05/26/2023   Acute non-recurrent pansinusitis 01/04/2023   Perennial allergic rhinitis 07/09/2022   Asthma-COPD overlap syndrome (HCC) 04/21/2022   Recurrent infections 04/21/2022   S/P aortobifemoral bypass surgery 04/03/2021   S/P CABG x 1 11/24/2019   S/P aortic valve replacement with bioprosthetic valve 11/24/2019   PAD (peripheral artery disease) (HCC)    Mild persistent asthma 10/29/2019   RBBB (right bundle branch block with left anterior fascicular block)    Peripheral neuropathy    S/P lumbar spinal fusion 10/25/2014   Recovering alcoholic in remission (HCC) 08/10/2014   Substance induced mood disorder (HCC) 06/30/2014    Substance-induced sleep disorder (HCC) 06/30/2014   Obsessive compulsive disorder    OSA (obstructive sleep apnea) 01/20/2014   CAD (coronary artery disease)    Mentally disabled 11/25/2012   Hyperlipidemia with target LDL less than 130 11/25/2012   Bipolar disorder (HCC)    Hypertension    Allergic rhinitis due to pollen 08/11/2011    ONSET DATE: 08/28/2023, resume orders 10/13/2023 provided by Dr. Loman Brooklyn, MD  REFERRING DIAG: R55 (ICD-10-CM) - Syncope, unspecified syncope type I60.9 (ICD-10-CM) - Subarachnoid hemorrhage (HCC)  THERAPY DIAG:  Unsteadiness on feet  Muscle weakness (generalized)  Other abnormalities of gait and mobility  Rationale for Evaluation and Treatment: Rehabilitation  SUBJECTIVE:  SUBJECTIVE STATEMENT: Patient reports he saw his cardiologist and was cleared to do ROM only for BUEs. Has been walking on the treadmill without using UE support at the gym. Denies pain or acute changes   Pt accompanied by:  Wife, Chales Abrahams   PERTINENT HISTORY:  pacemaker placement 09/29/2023, CABG, aortic valve replacement (2021)  PAIN:  Are you having pain? No  PRECAUTIONS: Fall and Other: no bending forward (skull fx), cannot lift more than 10lbs until November 18, 2023   PATIENT GOALS: "To reduce my symptoms."  OBJECTIVE:   DIAGNOSTIC FINDINGS:   CT Head wo Contrast 09/08/2023 IMPRESSION: 1. Expected evolution of multicompartmental intracranial hemorrhage since 08/28/2023, with no residual hyperdense blood. 2. 3 mm thick hypodense right parietal convexity subdural collection is unchanged and may reflect a subdural hygroma or evolving subdural hematoma. 3. Unchanged right temporal/parietal bone fracture.  CT Lumbar Spine wo Contrast 09/08/2023: IMPRESSION: 1. No acute  fracture or traumatic malalignment of the lumbar spine. 2. Postsurgical changes reflecting posterior instrumented fusion and decompression at L5-S1 without residual spinal canal or neural foraminal stenosis. 3. Adjacent segment disease at L3-L4 resulting in at least moderate spinal canal stenosis and moderate bilateral neural foraminal stenosis.  CT of head from 08/27/23   IMPRESSION: 1. No significant interval change in bilateral frontal convexity subdural fluid collection measuring up to 6 mm. 2. Redemonstrated subarachnoid hemorrhage and hemorrhagic contusions along the bilateral anterior frontal lobes, slightly increased on the left measuring up 2.1 cm ( previously 1.9 cm), and unchanged along the right frontal lobe. Compared to prior exam there is interval increase in conspicuity of subarachnoid blood products along the bilateral parietal lobes, likely due to redistribution. No evidence of intraventricular extension. No hydrocephalus. 3. Redemonstrated acute nondisplaced fracture involving the right temporal bone.   VITALS There were no vitals filed for this visit.   TODAY'S TREATMENT:           Ther Act  Entirety of session spent discussing PT POC moving forward and concerns w/pt returning to work this Friday. Informed pt that when he returns to work, he needs to keep his hours low (3 hours max) in order to build up tolerance for a full shift. Pt initially stating he would like to return to work this Friday, but as it is the Friday before Thanksgiving, advised pt against this as his shift is likely to be very busy. Pt in agreement to go to the store mid-day on Saturday with his wife w/o the walker to reintegrate himself into a busy, overstimulating environment and see how he feels. Then next Friday he may attempt to work a short shift. Pt in agreement with this.  Pt and wife requesting to add more visits to POC at a frequency of 1x/week to continue working on endurance and return  to work. Will send recert next session.     PATIENT EDUCATION: Education details: See above  Person educated: Patient and Spouse Education method: Explanation, Demonstration, and Handouts Education comprehension: verbalized understanding, returned demonstration, and needs further education  HOME EXERCISE PROGRAM:  Access Code: [email or web address] URL: [email or web address] Date: 09/08/2023 Prepared by: Maryruth Eve  Exercises - Corner Balance Feet Together With Eyes Closed  - 1 x daily - 7 x weekly - 3 sets - 30 seconds hold - Pencil Pushups  - 1 x daily - 7 x weekly - 3 sets - 10 reps - Seated Horizontal Saccades  - 1 x daily - 7 x weekly - 3 sets - 10 reps -  Seated Vertical Saccades  - 1 x daily - 7 x weekly - 3 sets - 10 reps -[place] eye tracking (1 paragraph)  -King Devick's Saccades 1x a day - Side Stepping with Resistance at Thighs and Counter Support  - 1 x daily - 7 x weekly - 3 sets - 10 reps - Forward Backward Monster Walk with Band at Thighs and Counter Support  - 1 x daily - 7 x weekly - 3 sets - 10 reps - Supine Bridge  - 1 x daily - 7 x weekly - 3 sets - 10 reps - Clamshell  - 1 x daily - 7 x weekly - 3 sets - 10 reps - Squat with Chair Touch  - 1 x daily - 7 x weekly - 3 sets - 10 reps  Education on SPOT it game and HART chart use, VOR x 1 viewing 3 x 5 rounds Brock string ~3x a day with spouse guiding through   GOALS: Goals reviewed with patient? Yes  SHORT TERM GOALS: Target date: 09/29/2023   Pt will be independent with initial HEP for improved balance, transfers and gait.  Baseline: not established on eval; provided Goal status: MET  2.  Patient will improve RPQ-3 score to </= 2 per item to indicate reduced severity of post-concussive symptoms to progress towards PLOF.    Baseline: 3/4, 3/4 Goal status: NOT MET  3.  5x STS to be assessed and STG/LTG updated  Baseline: 15.65 seconds without UE use (SBA) Goal status: Discontinued STG due to how  well performed   4.  Patient will improve mCTSIB score to 100/120 to indicate improved integration of vestibular, proprioceptive, and visual balance systems during static balance tasks in order to reduce risk for falls.   Baseline: 86/120; 120/120 Goal status: MET   LONG TERM GOALS: Target date: 10/27/2023   Pt will be independent with final HEP for improved balance, transfers and gait.  Baseline: Reports partial compliance  Goal status: IN PROGRESS   2.  Patient will improve RPQ-13 score to </= 2 per item to indicate reduced severity of post-concussive symptoms to progress towards PLOF.   Baseline: greatest deficit reported 4/4, greatest defciti 3/4 on fatige and feeling depressed Goal status: NOT MET BUT PROGRESSED   3.  Patient will improve their 5x Sit to Stand score to less than 13 seconds to demonstrate a decreased risk for falls and improved LE strength.   Baseline: 15.65 seconds without UE use (SBA); 9.2 seconds without UE use (SBA); improved to 9.2 seconds  Goal status: MET  4. Patient will improve mCTSIB score to 120/120 to indicate improved integration of vestibular, proprioceptive, and visual balance systems during static balance tasks in order to reduce risk for falls.   Baseline: 86/120; 120/120  Goal status: MET  LONG TERM GOALS UPDATED FOLLOWING RETURN TO CARE: Target date: 12/07/2023  Pt will be independent with final HEP for improved balance, transfers and gait. Baseline: Reports partial compliance  Goal status: IN PROGRESS   2.  Patient will improve RPQ-13 score to </= 2 per item to indicate reduced severity of post-concussive symptoms to progress towards PLOF.   Baseline: greatest deficit reported 4/4, greatest defciti 3/4 on fatige and feeling depressed Goal status: NOT MET BUT PROGRESSED   3.  SOT to be assessed and LTG updated  Baseline: To be assessed  Goal status: Discontinued at this time due to pacemaker incision placement and harness that needs to be  donned with testing  ASSESSMENT:  CLINICAL IMPRESSION: Emphasis of skilled PT session on pt education regarding returning to work and PT POC. Pt reporting he would like to return to work this week, but advised pt against this as it is a holiday weekend and pt will need to build up his tolerance slowly. Pt to try to improve his daily steps and endurance w/o use of RW first and then will think about return to work. Pt and therapist in agreement to add more appointments at a frequency of 1x/week to continue working on endurance and return to work. Continue POC.     OBJECTIVE IMPAIRMENTS: Abnormal gait, decreased activity tolerance, decreased balance, decreased cognition, decreased coordination, decreased endurance, decreased knowledge of condition, decreased knowledge of use of DME, decreased mobility, difficulty walking, decreased strength, decreased safety awareness, dizziness, impaired perceived functional ability, impaired vision/preception, and pain  ACTIVITY LIMITATIONS: carrying, lifting, bending, sleeping, stairs, transfers, hygiene/grooming, locomotion level, and caring for others  PARTICIPATION LIMITATIONS: meal prep, cleaning, laundry, interpersonal relationship, driving, shopping, community activity, occupation, and yard work  PERSONAL FACTORS: Fitness, Past/current experiences, and 3+ comorbidities: Bradycardia, post-concussion w/LOC,   are also affecting patient's functional outcome.   REHAB POTENTIAL: Good  CLINICAL DECISION MAKING: Unstable/unpredictable  EVALUATION COMPLEXITY: High  PLAN:  PT FREQUENCY: 2x/week  PT DURATION: 4 additional weeks since last seen for therapy   PLANNED INTERVENTIONS: Therapeutic exercises, Therapeutic activity, Neuromuscular re-education, Balance training, Gait training, Patient/Family education, Self Care, Joint mobilization, Stair training, Vestibular training, Canalith repositioning, DME instructions, Aquatic Therapy, Dry Needling, Electrical  stimulation, Manual therapy, and Re-evaluation  PLAN FOR NEXT SESSION: recert. progress occulomotor/vestibular exercises (VORx1 as tolerated, Brock string), progress corner balance (EC on foam, tandem with head turns as able etc), consider neuropthomology referral pending progress; did OT/speech get scheduled, HART chart with balance work, work on Patent attorney scanning with balance training or dynamic gait/balance without AD with visual scanning, midline orientation,   Continue to work on gait without AD and progress higher level balance tasks as able. Pt would like to work on functional hip/LE strength    Aynslee Mulhall E Kashis Penley, PT, DPT 11/09/2023, 2:00 PM

## 2023-11-09 NOTE — Patient Instructions (Signed)
Medication Instructions:  No changes  *If you need a refill on your cardiac medications before your next appointment, please call your pharmacy*   Lab Work: None  If you have labs (blood work) drawn today and your tests are completely normal, you will receive your results only by: MyChart Message (if you have MyChart) OR A paper copy in the mail If you have any lab test that is abnormal or we need to change your treatment, we will call you to review the results.   Testing/Procedures: None    Follow-Up: At [facility], you and your health needs are our priority.  As part of our continuing mission to provide you with exceptional heart care, we have created designated Provider Care Teams.  These Care Teams include your primary Cardiologist (physician) and Advanced Practice Providers (APPs -  Physician Assistants and Nurse Practitioners) who all work together to provide you with the care you need, when you need it.    Your next appointment:   6 month(s)  Provider:   Peter Swaziland, MD  OR Melburn Popper NP     Other Instructions Continue physical Activity it is okay to add ROM for upper body In 2 weeks it is okay to do no more than 10 pounds and increase no more than 10% after  If there are no further episodes of lightheadedness or dizziness, low heart rate you can start to drive after March 3  Do couging and deep breathing exercises 2-3 reps twice daily

## 2023-11-10 ENCOUNTER — Ambulatory Visit: Payer: PPO | Admitting: Physical Therapy

## 2023-11-10 ENCOUNTER — Encounter (INDEPENDENT_AMBULATORY_CARE_PROVIDER_SITE_OTHER): Payer: PPO | Admitting: Ophthalmology

## 2023-11-10 ENCOUNTER — Encounter: Payer: PPO | Admitting: Occupational Therapy

## 2023-11-10 DIAGNOSIS — H43813 Vitreous degeneration, bilateral: Secondary | ICD-10-CM | POA: Diagnosis not present

## 2023-11-10 DIAGNOSIS — H35342 Macular cyst, hole, or pseudohole, left eye: Secondary | ICD-10-CM | POA: Diagnosis not present

## 2023-11-10 DIAGNOSIS — I10 Essential (primary) hypertension: Secondary | ICD-10-CM

## 2023-11-10 DIAGNOSIS — H35033 Hypertensive retinopathy, bilateral: Secondary | ICD-10-CM

## 2023-11-10 DIAGNOSIS — H35373 Puckering of macula, bilateral: Secondary | ICD-10-CM

## 2023-11-10 DIAGNOSIS — D3132 Benign neoplasm of left choroid: Secondary | ICD-10-CM

## 2023-11-11 ENCOUNTER — Encounter: Payer: PPO | Admitting: Occupational Therapy

## 2023-11-11 ENCOUNTER — Ambulatory Visit: Payer: PPO

## 2023-11-11 ENCOUNTER — Ambulatory Visit: Payer: PPO | Admitting: Physical Therapy

## 2023-11-11 VITALS — BP 132/64 | HR 61

## 2023-11-11 DIAGNOSIS — R41841 Cognitive communication deficit: Secondary | ICD-10-CM

## 2023-11-11 DIAGNOSIS — R2681 Unsteadiness on feet: Secondary | ICD-10-CM | POA: Diagnosis not present

## 2023-11-11 DIAGNOSIS — M6281 Muscle weakness (generalized): Secondary | ICD-10-CM

## 2023-11-11 DIAGNOSIS — R2689 Other abnormalities of gait and mobility: Secondary | ICD-10-CM

## 2023-11-11 NOTE — Therapy (Signed)
OUTPATIENT SPEECH LANGUAGE PATHOLOGY TREATMENT (DISCHARGE)   Patient Name: Gregory [name] MRN: [id number] DOB:[birth year], 72 y.o., male Today's Date: 11/11/2023  PCP: Ronnald Nian, MD REFERRING PROVIDER: Ronnald Nian, MD  END OF SESSION:  End of Session - 11/11/23 0942     Visit Number 8    Number of Visits 18    Date for SLP Re-Evaluation 11/25/23    SLP Start Time 1015    SLP Stop Time  1100    SLP Time Calculation (min) 45 min    Activity Tolerance Patient tolerated treatment well              SPEECH THERAPY DISCHARGE SUMMARY  Visits from Start of Care: 8  Current functional level related to goals / functional outcomes: Demonstrates improvements in safety awareness, short term recall, attention, and executive functioning with utilization of trained techniques.    Remaining deficits: Mild attention deficits   Education / Equipment: Cognitive compensations, safety awareness tasks, functional application   Patient agrees to discharge. Patient goals were met. Patient is being discharged due to meeting the stated rehab goals..     Past Medical History:  Diagnosis Date   Alcohol abuse    Aortic stenosis    Arthritis    Asthma    exercise indused or pollen-uses inhaler dail yand has rescue inhaler if needed   Bipolar disorder (HCC)    CAD (coronary artery disease)    coronary calcifications 08/2013 CTA   Carotid artery occlusion    Cataract    bilateral sx   Chronic back pain    Claudication (HCC)    Complication of anesthesia 10/16/2020   "had a panic attack when the mask was put on me"" I do not think I was given anything  before, I need something to calm me down"   Depression    Family history of skin cancer    Heart murmur    as an infant   Hepatitis    h/o 1970,doesn't remember which type,1990 epstain-barr   Herpes zoster without complication 09/05/2019   Hyperlipidemia    Hypertension    Neuromuscular disorder (HCC)    Obsessive  compulsive disorder    PAD (peripheral artery disease) (HCC)    Peripheral neuropathy    Pneumonia    RBBB    RBBB (right bundle branch block with left anterior fascicular block)    Seasonal allergies    Severe aortic stenosis    Sleep apnea    does not wear CPAP   Smoker    Spinal stenosis at L4-L5 level    Tobacco abuse    Tobacco use disorder    Past Surgical History:  Procedure Laterality Date   ANAL FISTULECTOMY  09/25/2011   AORTA - BILATERAL FEMORAL ARTERY BYPASS GRAFT N/A 10/03/2020   Procedure: AORTA BIFEMORAL BYPASS GRAFT USING A HEMASHIELD GOLD BIFURCATED 14 X 7mm GRAFT AND INFERIOR MESENTERIC ARTERY REIMPLANTATION;  Surgeon: Nada Libman, MD;  Location: [facility] OR;  Service: Vascular;  Laterality: N/A;   AORTIC VALVE REPLACEMENT N/A 11/24/2019   Procedure: AORTIC VALVE REPLACEMENT (AVR) using INSPIRIS Resilia 23 MM Bioprosthetic Aortic Valve.;  Surgeon: Alleen Borne, MD;  Location: [facility] OR;  Service: Open Heart Surgery;  Laterality: N/A;   APPLICATION OF WOUND VAC Left 10/17/2020   Procedure: APPLICATION OF WOUND VAC;  Surgeon: Nada Libman, MD;  Location: [facility] OR;  Service: Vascular;  Laterality: Left;   BACK SURGERY     2015  CATARACT EXTRACTION, BILATERAL  2022   COLONOSCOPY  2009   MS-F/V-mov(exc)-HPP   CORONARY ARTERY BYPASS GRAFT N/A 11/24/2019   Procedure: CORONARY ARTERY BYPASS GRAFTING (CABG) using LIMA to LAD.;  Surgeon: Alleen Borne, MD;  Location: [facility] OR;  Service: Open Heart Surgery;  Laterality: N/A;   ELBOW SURGERY     bilaterally for cubital tunnel   ENDARTERECTOMY N/A 10/03/2020   Procedure: AORTIC ENDARTERECTOMY;  Surgeon: Nada Libman, MD;  Location: [facility];  Service: Vascular;  Laterality: N/A;   ENDARTERECTOMY FEMORAL Bilateral 10/03/2020   Procedure: BILATERAL FEMORAL ENDARTERECTOMY;  Surgeon: Nada Libman, MD;  Location: [facility];  Service: Vascular;  Laterality: Bilateral;   HERNIA REPAIR     with mesh   INCISION AND DRAINAGE Left  01/02/2021   Procedure: INCISION AND DRAINAGE LEFT GROIN WITH STIMULAN BEADS;  Surgeon: Nada Libman, MD;  Location: [facility];  Service: Vascular;  Laterality: Left;   INCISION AND DRAINAGE OF WOUND Left 10/17/2020   Procedure: EXPLORATION LEFT GROIN WOUND;  Surgeon: Nada Libman, MD;  Location: [facility];  Service: Vascular;  Laterality: Left;   MAXIMUM ACCESS (MAS)POSTERIOR LUMBAR INTERBODY FUSION (PLIF) 1 LEVEL N/A 10/25/2014   Procedure: LUMBAR FOUR TO FIVE MAXIMUM ACCESS (MAS) POSTERIOR LUMBAR INTERBODY FUSION (PLIF) 1 LEVEL;  Surgeon: Tia Alert, MD;  Location: [facility];  Service: Neurosurgery;  Laterality: N/A;   PACEMAKER IMPLANT N/A 10/06/2023   Procedure: PACEMAKER IMPLANT;  Surgeon: Regan Lemming, MD;  Location: [facility]AS[facility];  Service: Cardiovascular;  Laterality: N/A;   RIGHT/LEFT HEART CATH AND CORONARY ANGIOGRAPHY N/A 11/10/2019   Procedure: RIGHT/LEFT HEART CATH AND CORONARY ANGIOGRAPHY;  Surgeon: Swazil[place], [name], MD;  Location: [facility] INVASIVE CV LAB;  Service: Cardiovascular;  Laterality: N/A;   SKIN BIOPSY Left 10/12/2018   shave forehead Hypertrophic actinic kertosis with features of a verruca   TEE WITHOUT CARDIOVERSION N/A 11/24/2019   Procedure: TRANSESOPHAGEAL ECHOCARDIOGRAM (TEE);  Surgeon: Alleen Borne, MD;  Location: [facility];  Service: Open Heart Surgery;  Laterality: N/A;   TONSILLECTOMY  age 68   VASECTOMY     X 2   VASECTOMY REVERSAL     Patient Active Problem List   Diagnosis Date Noted   Symptomatic bradycardia 10/05/2023   Subarachnoid hemorrhage following injury (HCC) 08/26/2023   Skull fracture with cerebral contusion (HCC) 08/26/2023   Temporal bone fracture (HCC) 08/26/2023   Aortic atherosclerosis (HCC) 05/26/2023   Acute non-recurrent pansinusitis 01/04/2023   Perennial allergic rhinitis 07/09/2022   Asthma-COPD overlap syndrome (HCC) 04/21/2022   Recurrent infections 04/21/2022   S/P aortobifemoral bypass surgery 04/03/2021   S/P  CABG x 1 11/24/2019   S/P aortic valve replacement with bioprosthetic valve 11/24/2019   PAD (peripheral artery disease) (HCC)    Mild persistent asthma 10/29/2019   RBBB (right bundle branch block with left anterior fascicular block)    Peripheral neuropathy    S/P lumbar spinal fusion 10/25/2014   Recovering alcoholic in remission (HCC) 08/10/2014   Substance induced mood disorder (HCC) 06/30/2014   Substance-induced sleep disorder (HCC) 06/30/2014   Obsessive compulsive disorder    OSA (obstructive sleep apnea) 01/20/2014   CAD (coronary artery disease)    Mentally disabled 11/25/2012   Hyperlipidemia with target LDL less than 130 11/25/2012   Bipolar disorder (HCC)    Hypertension    Allergic rhinitis due to pollen 08/11/2011    ONSET DATE: 08/25/23   REFERRING DIAG: W09.9X1D (ICD-10-CM) -  Traumatic brain injury, with loss of consciousness of 30 minutes or less, subsequent encounter R41.3 (ICD-10-CM) - Memory deficit  THERAPY DIAG: Cognitive communication deficit  Rationale for Evaluation and Treatment: Rehabilitation  SUBJECTIVE:   SUBJECTIVE STATEMENT: "I had to tell him something five times" Pt accompanied by: significant other Chales Abrahams  PERTINENT HISTORY: Pt reports long history of cardiac issues/dizziness, but has never lost consciousness. Pt reports he felt dizzy and was reaching up at an aisle in [employer]. Lost consciousness and fell backwards onto the concrete floor and his his head. Does not know how long he was unconscious, was found by another shopper. Has a history of bradycardia, wore a heart monitor for 2 weeks and was cleared by his cardiologist in August to exercise prior to this incident. Works as a Investment banker, operational at [employer] and is used to weightlifting/cardio several times per week.   PAIN:  Are you having pain? No  FALLS: Has patient fallen in last 6 months?  See PT evaluation for details  LIVING ENVIRONMENT: Lives with: lives with their spouse Lives  in: House/apartment  PLOF:  Level of assistance: Independent with ADLs, Independent with IADLs Employment: Part-time employment  PATIENT GOALS: "To go back to work and drive"  OBJECTIVE:   TODAY'S TREATMENT:                                                                                                                                         11/11/23: Discussed remaining cognitive concerns from wife, including reduced focused attention during conversation resulting in multiple repetitions, errored recall of appointment time, and delayed processing of verbal information. Provided education and instruction of attention/processing strategies, including establishing eye contact to focus attention if divided, rephrasing/clarifying information to aid processing and recall, and verbal coaching of written information for attention to detail and error awareness. Pt verbalized plan to request information in writing at work to aid cognitive functioning if needed. Pt is pleased with current level of functioning and agreeable to ST discharge. Expected to return to work next week and may request additional ST services if cognitive linguistic challenges observed s/p return to work.   11/09/23: Pt provided thorough verbal recall of recent SLP and MD recommendations. Denied any overt cognitive concerns for upcoming return to work. Recommended pro/con list for return to work date based on wife concerns. Able to demonstrate adequate executive functioning and awareness related to possible work-related challenges. Recommended implementing strategies/modifications as needed if/when cognitive challenges occur. Pt is pleased with current level of functioning with plan to d/c next session.   11/04/23: Continued discussion of potential cognitive compensations to aid return to work. Targeted anticipatory awareness and safety judgement of work tasks. With occasional min A, pt identified potential limitations at work (physical or  cognitive) and determined reasonable accommodations (e.g., having stool nearby to sit as needed). SLP recommended having all accommodations explicitly written by doctor  and pt agreed. In simulated work activity, pt demonstrated alternating attention to complete task with independent awareness of need to double check.   11/02/23: Discussed pt desire to return to work and the cognitive skills required for successful return. Current medical recommendation indicating return in ~4 weeks but pt would like to return sooner if possible pending medical clearance. Targeted meta-analysis of work tasks, cognitive sequencing for select tasks, and possible modifications needed to optimize success. Adequate safety awareness noted when typical culinary safety concerns discussed. Able to ID accommodation x3 to account for some physical limitations (I.e., weight bearing restrictions) and novel event planning (ex: using visual supports) but exhibited reduced anticipatory awareness of overall significant increase in physical load (I.e., standing for 6 hours) when wife detailed her concerns. Continue to target emergent/anticipatory awareness, safety judgement, and task completion in upcoming sessions if applicable.   10/28/23: Pt reports overall cognitive improvement with home-based tasks. He accurately recalled and implemented trained techniques discussed last session. Endorsed attention challenges completing entirety of task before moving on to the next (ex: putting items used to make lunch away). SLP A pt in problem-solving strategies to remind himself to return to task with min prompting. Pt questioned activities to increase cognitive functioning. SLP provided handout and educated pt on the "mind diet" and activities to promote brain health. Wife reports concern for pt's desire to return to work due to current cognitive skills. Plan to address next session.   10/21/23: Endorsed feeling frustrated with management of fluctuating  schedule, numerous appointments, and health journey in general. Demonstrated ability to re-frame and reduce frustration re: therapeutic process and recovery expectations, which has aided mental clarity. Identified he was previously successful when structure and rigid framework in place (ex: work) versus daily schedule differences. Recommended using prior strengths to modify/compensate for current cognitive challenges, such as using visual aids and verbal sequencing to aid retention and attention. Endorsed some difficulty mult-taking at home, resulting in deviations in attention and forgetting to use walker. Targeted meta-cognitive analysis and cognitive strategy selection with pt able to ID appropriate solution with min prompting. Pt able demo and carryover targeted solution (enclosing self with walker) with rare min A in stimulating environment. Reported overall good carryover of targeted recommendations from last session, which aided safety at home.    10/14/23: Pt experienced medical event since prior evaluation session and had a pacemaker implanted. Endorses use of walker and limited challenges, although wife reports otherwise. Wife stated that pt is not using walker consistently, such as walking by it when leaving couch, leaving it in one kitchen door and exiting out the other, and not bringing it to the bathroom. Pt agreed with wife's statements and commented "out of sight out of mind." SLP led pt and wife in generating functional solutions, with wife stating she can place chair in second kitchen doorway as a visual cue to use the other one (where walker is placed). Additionally SLP recommended keeping walker within a couple feet at all times, such as when watching tv on the couch. Pt reports returning to simple cooking tasks with wife assisting with cutting, as pt cannot use knife. Demonstrated anticipatory awareness skills when noting that 3-minutes was too long for toast (it burned) and that he would set  the timer for less next time.   09/30/23: Reviewed goals - eval only   PATIENT EDUCATION: Education details: See Today's Treatment, See Patient Instructions, compensations for cognition, accommodations in workplace Person educated: Patient and Spouse Education method: Explanation and  Verbal cues Education comprehension: verbal cues required and needs further education   GOALS: Goals reviewed with patient? Yes  SHORT TERM GOALS: Target date: 10/28/23  Pt will carryover 2 strategies for attention to successfully carryover household tasks that require alternating attention with supervision cues.  Baseline: Goal status: MET  2.  Pt will make safe decisions at home during household tasks with rare min A Baseline:  Goal status: MET  3.  Pt will verbalize 3 compensations for attention when driving (for if/when he is cleared my MD to drive) Baseline:  Goal status: DEFERRED   4. Pt will verbalize safety awareness in kitchen, garden, and work with rare min A             Goal status: MET   LONG TERM GOALS: Target date: 11/25/23  Pt will carryover compensations for attention on divided attention tasks at home Baseline:  Goal status: MET  2.  Pt will verbalize 3 strategies to support alternating and divided attention at work with rare min A Baseline:  Goal status: MET  3.  Pt will verbalize 3 accommodations to support cognition and energy conservation upon return to work Baseline:  Goal status: MET  ASSESSMENT:  CLINICAL IMPRESSION: Patient is a 72 y.o. male who was seen today for cognition s/p TBI and fall. SLP completed education and instruction of cognitive strategies and assisted in problem-solving and strategy selection for work-related tasks. Pt has met ST goals. D/C today. Pt and wife verbalized agreement.   OBJECTIVE IMPAIRMENTS: include attention and awareness. These impairments are limiting patient from return to work and household responsibilities. Factors affecting  potential to achieve goals and functional outcome are  n/a .[name] Patient will benefit from skilled SLP services to address above impairments and improve overall function.  REHAB POTENTIAL: Good  PLAN:  SLP FREQUENCY: 2x/week  SLP DURATION: 8 weeks  PLANNED INTERVENTIONS: Environmental controls, Cueing hierachy, Cognitive reorganization, Internal/external aids, Functional tasks, and Multimodal communication approach    Gracy Racer, CCC-SLP 11/11/2023, 11:04 AM

## 2023-11-11 NOTE — Therapy (Signed)
OUTPATIENT PHYSICAL THERAPY NEURO TREATMENT- RECERTIFICATION    Patient Name: Gregory [name] MRN: [id number] DOB:1951-11-28, 72 y.o., male Today's Date: 11/11/2023   PCP: Ronnald Nian, MD REFERRING PROVIDER: Willeen Niece, MD  END OF SESSION:  PT End of Session - 11/11/23 0934     Visit Number 18    Number of Visits 22    Date for PT Re-Evaluation 12/16/23    Authorization Type [employer]    PT Start Time 0932    PT Stop Time 1015    PT Time Calculation (min) 43 min    Equipment Utilized During Treatment Gait belt    Activity Tolerance Patient tolerated treatment well    Behavior During Therapy WFL for tasks assessed/performed               Past Medical History:  Diagnosis Date   Alcohol abuse    Aortic stenosis    Arthritis    Asthma    exercise indused or pollen-uses inhaler dail yand has rescue inhaler if needed   Bipolar disorder (HCC)    CAD (coronary artery disease)    coronary calcifications 08/2013 CTA   Carotid artery occlusion    Cataract    bilateral sx   Chronic back pain    Claudication (HCC)    Complication of anesthesia 10/16/2020   "had a panic attack when the mask was put on me"" I do not think I was given anything  before, I need something to calm me down"   Depression    Family history of skin cancer    Heart murmur    as an infant   Hepatitis    h/o 1970,doesn't remember which type,1990 epstain-barr   Herpes zoster without complication 09/05/2019   Hyperlipidemia    Hypertension    Neuromuscular disorder (HCC)    Obsessive compulsive disorder    PAD (peripheral artery disease) (HCC)    Peripheral neuropathy    Pneumonia    RBBB    RBBB (right bundle branch block with left anterior fascicular block)    Seasonal allergies    Severe aortic stenosis    Sleep apnea    does not wear CPAP   Smoker    Spinal stenosis at L4-L5 level    Tobacco abuse    Tobacco use disorder    Past Surgical History:  Procedure  Laterality Date   ANAL FISTULECTOMY  09/25/2011   AORTA - BILATERAL FEMORAL ARTERY BYPASS GRAFT N/A 10/03/2020   Procedure: AORTA BIFEMORAL BYPASS GRAFT USING A HEMASHIELD GOLD BIFURCATED 14 X 7mm GRAFT AND INFERIOR MESENTERIC ARTERY REIMPLANTATION;  Surgeon: Nada Libman, MD;  Location: [facility] OR;  Service: Vascular;  Laterality: N/A;   AORTIC VALVE REPLACEMENT N/A 11/24/2019   Procedure: AORTIC VALVE REPLACEMENT (AVR) using INSPIRIS Resilia 23 MM Bioprosthetic Aortic Valve.;  Surgeon: Alleen Borne, MD;  Location: [facility] OR;  Service: Open Heart Surgery;  Laterality: N/A;   APPLICATION OF WOUND VAC Left 10/17/2020   Procedure: APPLICATION OF WOUND VAC;  Surgeon: Nada Libman, MD;  Location: [facility] OR;  Service: Vascular;  Laterality: Left;   BACK SURGERY     2015   CATARACT EXTRACTION, BILATERAL  2022   COLONOSCOPY  2009   MS-F/V-mov(exc)-HPP   CORONARY ARTERY BYPASS GRAFT N/A 11/24/2019   Procedure: CORONARY ARTERY BYPASS GRAFTING (CABG) using LIMA to LAD.;  Surgeon: Alleen Borne, MD;  Location: [facility];  Service: Open Heart Surgery;  Laterality: N/A;   ELBOW SURGERY  bilaterally for cubital tunnel   ENDARTERECTOMY N/A 10/03/2020   Procedure: AORTIC ENDARTERECTOMY;  Surgeon: Nada Libman, MD;  Location: [facility];  Service: Vascular;  Laterality: N/A;   ENDARTERECTOMY FEMORAL Bilateral 10/03/2020   Procedure: BILATERAL FEMORAL ENDARTERECTOMY;  Surgeon: Nada Libman, MD;  Location: [facility];  Service: Vascular;  Laterality: Bilateral;   HERNIA REPAIR     with mesh   INCISION AND DRAINAGE Left 01/02/2021   Procedure: INCISION AND DRAINAGE LEFT GROIN WITH STIMULAN BEADS;  Surgeon: Nada Libman, MD;  Location: [facility];  Service: Vascular;  Laterality: Left;   INCISION AND DRAINAGE OF WOUND Left 10/17/2020   Procedure: EXPLORATION LEFT GROIN WOUND;  Surgeon: Nada Libman, MD;  Location: [facility];  Service: Vascular;  Laterality: Left;   MAXIMUM ACCESS (MAS)POSTERIOR LUMBAR INTERBODY  FUSION (PLIF) 1 LEVEL N/A 10/25/2014   Procedure: LUMBAR FOUR TO FIVE MAXIMUM ACCESS (MAS) POSTERIOR LUMBAR INTERBODY FUSION (PLIF) 1 LEVEL;  Surgeon: Tia Alert, MD;  Location: [facility];  Service: Neurosurgery;  Laterality: N/A;   PACEMAKER IMPLANT N/A 10/06/2023   Procedure: PACEMAKER IMPLANT;  Surgeon: Regan Lemming, MD;  Location: [facility]AS[facility];  Service: Cardiovascular;  Laterality: N/A;   RIGHT/LEFT HEART CATH AND CORONARY ANGIOGRAPHY N/A 11/10/2019   Procedure: RIGHT/LEFT HEART CATH AND CORONARY ANGIOGRAPHY;  Surgeon: Swazil[place], [name], MD;  [facility] INVASIVE CV LAB;  Service: Cardiovascular;  Laterality: N/A;   SKIN BIOPSY Left 10/12/2018   shave forehead Hypertrophic actinic kertosis with features of a verruca   TEE WITHOUT CARDIOVERSION N/A 11/24/2019   Procedure: TRANSESOPHAGEAL ECHOCARDIOGRAM (TEE);  Surgeon: Alleen Borne, MD;  Location: [facility] OR;  Service: Open Heart Surgery;  Laterality: N/A;   TONSILLECTOMY  age 36   VASECTOMY     X 2   VASECTOMY REVERSAL     Patient Active Problem List   Diagnosis Date Noted   Symptomatic bradycardia 10/05/2023   Subarachnoid hemorrhage following injury (HCC) 08/26/2023   Skull fracture with cerebral contusion (HCC) 08/26/2023   Temporal bone fracture (HCC) 08/26/2023   Aortic atherosclerosis (HCC) 05/26/2023   Acute non-recurrent pansinusitis 01/04/2023   Perennial allergic rhinitis 07/09/2022   Asthma-COPD overlap syndrome (HCC) 04/21/2022   Recurrent infections 04/21/2022   S/P aortobifemoral bypass surgery 04/03/2021   S/P CABG x 1 11/24/2019   S/P aortic valve replacement with bioprosthetic valve 11/24/2019   PAD (peripheral artery disease) (HCC)    Mild persistent asthma 10/29/2019   RBBB (right bundle branch block with left anterior fascicular block)    Peripheral neuropathy    S/P lumbar spinal fusion 10/25/2014   Recovering alcoholic in remission (HCC) 08/10/2014   Substance induced mood disorder (HCC)  06/30/2014   Substance-induced sleep disorder (HCC) 06/30/2014   Obsessive compulsive disorder    OSA (obstructive sleep apnea) 01/20/2014   CAD (coronary artery disease)    Mentally disabled 11/25/2012   Hyperlipidemia with target LDL less than 130 11/25/2012   Bipolar disorder (HCC)    Hypertension    Allergic rhinitis due to pollen 08/11/2011    ONSET DATE: 08/28/2023, resume orders 10/13/2023 provided by Dr. Loman Brooklyn, MD  REFERRING DIAG: R55 (ICD-10-CM) - Syncope, unspecified syncope type I60.9 (ICD-10-CM) - Subarachnoid hemorrhage (HCC)  THERAPY DIAG:  Unsteadiness on feet  Muscle weakness (generalized)  Other abnormalities of gait and mobility  Rationale for Evaluation and Treatment: Rehabilitation  SUBJECTIVE:  SUBJECTIVE STATEMENT: Patient reports he is sore from his workout yesterday. Did need to use the RW when walking at the store due to fatigue and deconditioning in his legs but is overall doing well   Pt accompanied by:  Wife, Chales Abrahams   PERTINENT HISTORY:  pacemaker placement 09/29/2023, CABG, aortic valve replacement (2021)  PAIN:  Are you having pain? No  PRECAUTIONS: Fall and Other: no bending forward (skull fx), cannot lift more than 10lbs until November 18, 2023   PATIENT GOALS: "To reduce my symptoms."  OBJECTIVE:   DIAGNOSTIC FINDINGS:   CT Head wo Contrast 09/08/2023 IMPRESSION: 1. Expected evolution of multicompartmental intracranial hemorrhage since 08/28/2023, with no residual hyperdense blood. 2. 3 mm thick hypodense right parietal convexity subdural collection is unchanged and may reflect a subdural hygroma or evolving subdural hematoma. 3. Unchanged right temporal/parietal bone fracture.  CT Lumbar Spine wo Contrast 09/08/2023: IMPRESSION: 1. No  acute fracture or traumatic malalignment of the lumbar spine. 2. Postsurgical changes reflecting posterior instrumented fusion and decompression at L5-S1 without residual spinal canal or neural foraminal stenosis. 3. Adjacent segment disease at L3-L4 resulting in at least moderate spinal canal stenosis and moderate bilateral neural foraminal stenosis.  CT of head from 08/27/23   IMPRESSION: 1. No significant interval change in bilateral frontal convexity subdural fluid collection measuring up to 6 mm. 2. Redemonstrated subarachnoid hemorrhage and hemorrhagic contusions along the bilateral anterior frontal lobes, slightly increased on the left measuring up 2.1 cm ( previously 1.9 cm), and unchanged along the right frontal lobe. Compared to prior exam there is interval increase in conspicuity of subarachnoid blood products along the bilateral parietal lobes, likely due to redistribution. No evidence of intraventricular extension. No hydrocephalus. 3. Redemonstrated acute nondisplaced fracture involving the right temporal bone.   VITALS Vitals:   11/11/23 0941  BP: 132/64  Pulse: 61     TODAY'S TREATMENT:           Ther Act  Assessed vitals (see above) and WNL for therapy.  Discussed pt's goals for next 4 weeks and pt in agreement to focus on endurance and strength training.     Gait pattern: step through pattern, lateral hip instability, and wide BOS Distance walked: 115' loop completed 10x +57' = 1207'  Assistive device utilized: None Level of assistance: SBA Comments: Pt rated SOB of 2/10 following assessment. No instability noted and pt able to maintain conversation w/therapist throughout assessment.    Ther Ex  Seated I, Y, T w/1# DB, x12 reps per side, for improved shoulder stability and ROM of BUEs. Concurrent multimodal cues required initially for proper pronation/supination of L hand, but pt then able to perform independently.    PATIENT  EDUCATION: Education details: See above  Person educated: Patient and Spouse Education method: Explanation, Demonstration, and Handouts Education comprehension: verbalized understanding, returned demonstration, and needs further education  HOME EXERCISE PROGRAM:  Access Code: [email or web address] URL: [email or web address] Date: 09/08/2023 Prepared by: Maryruth Eve  Exercises - Corner Balance Feet Together With Eyes Closed  - 1 x daily - 7 x weekly - 3 sets - 30 seconds hold - Pencil Pushups  - 1 x daily - 7 x weekly - 3 sets - 10 reps - Seated Horizontal Saccades  - 1 x daily - 7 x weekly - 3 sets - 10 reps - Seated Vertical Saccades  - 1 x daily - 7 x weekly - 3 sets - 10 reps -[place] eye tracking (1 paragraph)  Brooke Dare Devick's  Saccades 1x a day - Side Stepping with Resistance at Thighs and Counter Support  - 1 x daily - 7 x weekly - 3 sets - 10 reps - Forward Backward Monster Walk with Band at [employer] and Counter Support  - 1 x daily - 7 x weekly - 3 sets - 10 reps - Supine Bridge  - 1 x daily - 7 x weekly - 3 sets - 10 reps - Clamshell  - 1 x daily - 7 x weekly - 3 sets - 10 reps - Squat with Chair Touch  - 1 x daily - 7 x weekly - 3 sets - 10 reps  Education on SPOT it game and HART chart use, VOR x 1 viewing 3 x 5 rounds Brock string ~3x a day with spouse guiding through   GOALS: Goals reviewed with patient? Yes  SHORT TERM GOALS: Target date: 09/29/2023   Pt will be independent with initial HEP for improved balance, transfers and gait.  Baseline: not established on eval; provided Goal status: MET  2.  Patient will improve RPQ-3 score to </= 2 per item to indicate reduced severity of post-concussive symptoms to progress towards PLOF.    Baseline: 3/4, 3/4 Goal status: NOT MET  3.  5x STS to be assessed and STG/LTG updated  Baseline: 15.65 seconds without UE use (SBA) Goal status: Discontinued STG due to how well performed   4.  Patient will improve mCTSIB score  to 100/120 to indicate improved integration of vestibular, proprioceptive, and visual balance systems during static balance tasks in order to reduce risk for falls.   Baseline: 86/120; 120/120 Goal status: MET   LONG TERM GOALS: Target date: 10/27/2023   Pt will be independent with final HEP for improved balance, transfers and gait.  Baseline: Reports partial compliance  Goal status: IN PROGRESS   2.  Patient will improve RPQ-13 score to </= 2 per item to indicate reduced severity of post-concussive symptoms to progress towards PLOF.   Baseline: greatest deficit reported 4/4, greatest defciti 3/4 on fatige and feeling depressed Goal status: NOT MET BUT PROGRESSED   3.  Patient will improve their 5x Sit to Stand score to less than 13 seconds to demonstrate a decreased risk for falls and improved LE strength.   Baseline: 15.65 seconds without UE use (SBA); 9.2 seconds without UE use (SBA); improved to 9.2 seconds  Goal status: MET  4. Patient will improve mCTSIB score to 120/120 to indicate improved integration of vestibular, proprioceptive, and visual balance systems during static balance tasks in order to reduce risk for falls.   Baseline: 86/120; 120/120  Goal status: MET  NEW LONG TERM GOALS FOR UPDATED POC: Target date: 12/16/2023  Pt will ambulate greater than or equal to 1450' feet on with no AD mod I for improved cardiovascular endurance and BLE strength.   Baseline: 1207' no AD and SBA Goal status: INITIAL  2.  Pt will independent w/gym-based HEP for improved functional BUE/BLE strength, endurance and return to work.   Baseline: Pt can do AROM only w/LUE Goal status: INITIAL    ASSESSMENT:  CLINICAL IMPRESSION: Emphasis of skilled PT session on discussing goals for next 4 weeks, assessing endurance via and light A/ROM exercise for BUEs. Pt ambulated 1207' on , which is ~400' below age-adjusted norms, indicative of reduced cardiovascular capacity. Pt  reported a "clicking" in his L shoulder w/light AROM, but denied pain with activity. Pt will continue to benefit from skilled PT at  a frequency of 1x/week for improved functional strength, endurance, dual-tasking and return to work activities. Continue POC.   OBJECTIVE IMPAIRMENTS: Abnormal gait, decreased activity tolerance, decreased balance, decreased cognition, decreased coordination, decreased endurance, decreased knowledge of condition, decreased knowledge of use of DME, decreased mobility, difficulty walking, decreased strength, decreased safety awareness, dizziness, impaired perceived functional ability, impaired vision/preception, and pain  ACTIVITY LIMITATIONS: carrying, lifting, bending, sleeping, stairs, transfers, hygiene/grooming, locomotion level, and caring for others  PARTICIPATION LIMITATIONS: meal prep, cleaning, laundry, interpersonal relationship, driving, shopping, community activity, occupation, and yard work  PERSONAL FACTORS: Fitness, Past/current experiences, and 3+ comorbidities: Bradycardia, post-concussion w/LOC,   are also affecting patient's functional outcome.   REHAB POTENTIAL: Good  CLINICAL DECISION MAKING: Unstable/unpredictable  EVALUATION COMPLEXITY: High  PLAN:  PT FREQUENCY: 1x/week (recert)  PT DURATION: 4 weeks (recert)   PLANNED INTERVENTIONS: Therapeutic exercises, Therapeutic activity, Neuromuscular re-education, Balance training, Gait training, Patient/Family education, Self Care, Joint mobilization, Stair training, Vestibular training, Canalith repositioning, DME instructions, Aquatic Therapy, Dry Needling, Electrical stimulation, Manual therapy, and Re-evaluation  PLAN FOR NEXT SESSION:progress occulomotor/vestibular exercises (VORx1 as tolerated, Brock string), progress corner balance (EC on foam, tandem with head turns as able etc), consider neuropthomology referral pending progress; did OT/speech get scheduled, HART chart with balance work,  work on Patent attorney scanning with balance training or dynamic gait/balance without AD with visual scanning, midline orientation,   Continue to work on gait without AD and progress higher level balance tasks as able. Pt would like to work on functional hip/LE strength    Kord Monette E Amil Bouwman, PT, DPT 11/11/2023, 10:17 AM

## 2023-11-11 NOTE — Patient Instructions (Addendum)
Attention strategies:  Establish eye contact prior to engaging in conversation  Read information aloud to ensure recall of the right information  Have Gregory [name] verbally retell or rephrase what has been said to ensure he processed all the information  Request it in writing to ensure you don't miss information

## 2023-11-12 ENCOUNTER — Encounter: Payer: PPO | Admitting: Occupational Therapy

## 2023-11-12 ENCOUNTER — Ambulatory Visit: Payer: PPO

## 2023-11-12 ENCOUNTER — Encounter: Payer: PPO | Admitting: Speech Pathology

## 2023-11-16 ENCOUNTER — Ambulatory Visit (INDEPENDENT_AMBULATORY_CARE_PROVIDER_SITE_OTHER): Payer: PPO | Admitting: Family Medicine

## 2023-11-16 ENCOUNTER — Encounter: Payer: PPO | Admitting: Speech Pathology

## 2023-11-16 ENCOUNTER — Encounter: Payer: Self-pay | Admitting: Family Medicine

## 2023-11-16 VITALS — BP 120/62 | HR 68 | Temp 98.1°F | Ht 65.0 in | Wt 134.6 lb

## 2023-11-16 DIAGNOSIS — J0100 Acute maxillary sinusitis, unspecified: Secondary | ICD-10-CM | POA: Diagnosis not present

## 2023-11-16 DIAGNOSIS — K591 Functional diarrhea: Secondary | ICD-10-CM | POA: Diagnosis not present

## 2023-11-16 DIAGNOSIS — Z950 Presence of cardiac pacemaker: Secondary | ICD-10-CM

## 2023-11-16 MED ORDER — AMOXICILLIN-POT CLAVULANATE 875-125 MG PO TABS
1.0000 | ORAL_TABLET | Freq: Two times a day (BID) | ORAL | 0 refills | Status: DC
Start: 2023-11-16 — End: 2024-03-17

## 2023-11-16 NOTE — Progress Notes (Signed)
   Subjective:    Patient ID: Gregory [name], male    DOB: [birth year], 72 y.o.   MRN: [id number]  HPI He complains of a 4-day history of nasal congestion with cough, postnasal drainage, slight sore throat and in the last day or so to Faith as well as frontal sinus pressure.  He also states that he has a 2-day history of diarrhea but no fever chills malaise.  He has not used any Imodium.  No vomiting or nausea.  He also recently had a pacemaker placed.   Review of Systems     Objective:    Physical Exam  Alert and in no distress. Tympanic membranes and canals are normal. Pharyngeal area is normal. Neck is supple without adenopathy or thyromegaly. Cardiac exam shows a regular rhythm without murmurs or gallops. Lungs are clear to auscultation. Tender to palpation especially over frontal and maxillary sinuses nasal Wendall Papa is normal      Assessment & Plan:   Acute non-recurrent maxillary sinusitis - Plan: amoxicillin-clavulanate (AUGMENTIN) 875-125 MG tablet  Functional diarrhea  Pacemaker He has had sinus infections in the past and Augmentin works well for that.  Also recommend using Imodium on an as-needed basis for the diarrhea.  He was comfortable with that approach.  He will continue be followed by cardiology for his pacer.

## 2023-11-17 ENCOUNTER — Ambulatory Visit: Payer: PPO

## 2023-11-17 VITALS — BP 125/59 | HR 59

## 2023-11-17 DIAGNOSIS — R2689 Other abnormalities of gait and mobility: Secondary | ICD-10-CM

## 2023-11-17 DIAGNOSIS — R2681 Unsteadiness on feet: Secondary | ICD-10-CM

## 2023-11-17 DIAGNOSIS — M6281 Muscle weakness (generalized): Secondary | ICD-10-CM

## 2023-11-17 DIAGNOSIS — Z9181 History of falling: Secondary | ICD-10-CM

## 2023-11-17 NOTE — Therapy (Unsigned)
OUTPATIENT PHYSICAL THERAPY NEURO TREATMENT- RECERTIFICATION    Patient Name: Gregory [name] MRN: [id number] DOB:January 17, 1951, 72 y.o., male Today's Date: 11/17/2023   PCP: Ronnald Nian, MD REFERRING PROVIDER: Willeen Niece, MD  END OF SESSION:  PT End of Session - 11/17/23 1405     Visit Number 19    Number of Visits 22    Date for PT Re-Evaluation 12/16/23    Authorization Type [employer]    PT Start Time 1405    PT Stop Time 1448    PT Time Calculation (min) 43 min    Equipment Utilized During Treatment Gait belt    Activity Tolerance Patient tolerated treatment well               Past Medical History:  Diagnosis Date   Alcohol abuse    Aortic stenosis    Arthritis    Asthma    exercise indused or pollen-uses inhaler dail yand has rescue inhaler if needed   Bipolar disorder (HCC)    CAD (coronary artery disease)    coronary calcifications 08/2013 CTA   Carotid artery occlusion    Cataract    bilateral sx   Chronic back pain    Claudication (HCC)    Complication of anesthesia 10/16/2020   "had a panic attack when the mask was put on me"" I do not think I was given anything  before, I need something to calm me down"   Depression    Family history of skin cancer    Heart murmur    as an infant   Hepatitis    h/o 1970,doesn't remember which type,1990 epstain-barr   Herpes zoster without complication 09/05/2019   Hyperlipidemia    Hypertension    Neuromuscular disorder (HCC)    Obsessive compulsive disorder    PAD (peripheral artery disease) (HCC)    Peripheral neuropathy    Pneumonia    RBBB    RBBB (right bundle branch block with left anterior fascicular block)    Seasonal allergies    Severe aortic stenosis    Sleep apnea    does not wear CPAP   Smoker    Spinal stenosis at L4-L5 level    Tobacco abuse    Tobacco use disorder    Past Surgical History:  Procedure Laterality Date   ANAL FISTULECTOMY  09/25/2011   AORTA -  BILATERAL FEMORAL ARTERY BYPASS GRAFT N/A 10/03/2020   Procedure: AORTA BIFEMORAL BYPASS GRAFT USING A HEMASHIELD GOLD BIFURCATED 14 X 7mm GRAFT AND INFERIOR MESENTERIC ARTERY REIMPLANTATION;  Surgeon: Nada Libman, MD;  Location: [facility] OR;  Service: Vascular;  Laterality: N/A;   AORTIC VALVE REPLACEMENT N/A 11/24/2019   Procedure: AORTIC VALVE REPLACEMENT (AVR) using INSPIRIS Resilia 23 MM Bioprosthetic Aortic Valve.;  Surgeon: Alleen Borne, MD;  Location: [facility] OR;  Service: Open Heart Surgery;  Laterality: N/A;   APPLICATION OF WOUND VAC Left 10/17/2020   Procedure: APPLICATION OF WOUND VAC;  Surgeon: Nada Libman, MD;  Location: [facility] OR;  Service: Vascular;  Laterality: Left;   BACK SURGERY     2015   CATARACT EXTRACTION, BILATERAL  2022   COLONOSCOPY  2009   MS-F/V-mov(exc)-HPP   CORONARY ARTERY BYPASS GRAFT N/A 11/24/2019   Procedure: CORONARY ARTERY BYPASS GRAFTING (CABG) using LIMA to LAD.;  Surgeon: Alleen Borne, MD;  Location: [facility] OR;  Service: Open Heart Surgery;  Laterality: N/A;   ELBOW SURGERY     bilaterally for cubital tunnel  ENDARTERECTOMY N/A 10/03/2020   Procedure: AORTIC ENDARTERECTOMY;  Surgeon: Nada Libman, MD;  Location: [facility] OR;  Service: Vascular;  Laterality: N/A;   ENDARTERECTOMY FEMORAL Bilateral 10/03/2020   Procedure: BILATERAL FEMORAL ENDARTERECTOMY;  Surgeon: Nada Libman, MD;  Location: [facility] OR;  Service: Vascular;  Laterality: Bilateral;   HERNIA REPAIR     with mesh   INCISION AND DRAINAGE Left 01/02/2021   Procedure: INCISION AND DRAINAGE LEFT GROIN WITH STIMULAN BEADS;  Surgeon: Nada Libman, MD;  Location: [facility] OR;  Service: Vascular;  Laterality: Left;   INCISION AND DRAINAGE OF WOUND Left 10/17/2020   Procedure: EXPLORATION LEFT GROIN WOUND;  Surgeon: Nada Libman, MD;  Location: [facility] OR;  Service: Vascular;  Laterality: Left;   MAXIMUM ACCESS (MAS)POSTERIOR LUMBAR INTERBODY FUSION (PLIF) 1 LEVEL N/A 10/25/2014   Procedure: LUMBAR FOUR  TO FIVE MAXIMUM ACCESS (MAS) POSTERIOR LUMBAR INTERBODY FUSION (PLIF) 1 LEVEL;  Surgeon: Tia Alert, MD;  Location: [facility] NEURO ORS;  Service: Neurosurgery;  Laterality: N/A;   PACEMAKER IMPLANT N/A 10/06/2023   Procedure: PACEMAKER IMPLANT;  Surgeon: Regan Lemming, MD;  Location: [facility] INVASIVE CV LAB;  Service: Cardiovascular;  Laterality: N/A;   RIGHT/LEFT HEART CATH AND CORONARY ANGIOGRAPHY N/A 11/10/2019   Procedure: RIGHT/LEFT HEART CATH AND CORONARY ANGIOGRAPHY;  Surgeon: Swazil[place], [name], MD;  [facility] INVASIVE CV LAB;  Service: Cardiovascular;  Laterality: N/A;   SKIN BIOPSY Left 10/12/2018   shave forehead Hypertrophic actinic kertosis with features of a verruca   TEE WITHOUT CARDIOVERSION N/A 11/24/2019   Procedure: TRANSESOPHAGEAL ECHOCARDIOGRAM (TEE);  Surgeon: Alleen Borne, MD;  Location: [facility] OR;  Service: Open Heart Surgery;  Laterality: N/A;   TONSILLECTOMY  age 19   VASECTOMY     X 2   VASECTOMY REVERSAL     Patient Active Problem List   Diagnosis Date Noted   Symptomatic bradycardia 10/05/2023   Subarachnoid hemorrhage following injury (HCC) 08/26/2023   Skull fracture with cerebral contusion (HCC) 08/26/2023   Temporal bone fracture (HCC) 08/26/2023   Aortic atherosclerosis (HCC) 05/26/2023   Acute non-recurrent pansinusitis 01/04/2023   Perennial allergic rhinitis 07/09/2022   Asthma-COPD overlap syndrome (HCC) 04/21/2022   Recurrent infections 04/21/2022   S/P aortobifemoral bypass surgery 04/03/2021   S/P CABG x 1 11/24/2019   S/P aortic valve replacement with bioprosthetic valve 11/24/2019   PAD (peripheral artery disease) (HCC)    Mild persistent asthma 10/29/2019   RBBB (right bundle branch block with left anterior fascicular block)    Peripheral neuropathy    S/P lumbar spinal fusion 10/25/2014   Recovering alcoholic in remission (HCC) 08/10/2014   Substance induced mood disorder (HCC) 06/30/2014   Substance-induced sleep disorder (HCC) 06/30/2014    Obsessive compulsive disorder    OSA (obstructive sleep apnea) 01/20/2014   CAD (coronary artery disease)    Mentally disabled 11/25/2012   Hyperlipidemia with target LDL less than 130 11/25/2012   Bipolar disorder (HCC)    Hypertension    Allergic rhinitis due to pollen 08/11/2011    ONSET DATE: 08/28/2023, resume orders 10/13/2023 provided by Dr. Loman Brooklyn, MD  REFERRING DIAG: R55 (ICD-10-CM) - Syncope, unspecified syncope type I60.9 (ICD-10-CM) - Subarachnoid hemorrhage (HCC)  THERAPY DIAG:  No diagnosis found.  Rationale for Evaluation and Treatment: Rehabilitation  SUBJECTIVE:  SUBJECTIVE STATEMENT: Patient reported to therapy session with his wife and no AD. Denied falls and near falls. Has been to the gym a few times since last session. Reported cannot lift more than 10 lbs for an additional two weeks until 12/04/23. Was diagnosed with a sinus infection yesterday but feeling okay today.   Pt accompanied by:  Wife, Chales Abrahams   PERTINENT HISTORY:  pacemaker placement 09/29/2023, CABG, aortic valve replacement (2021)  PAIN:  Are you having pain? No  PRECAUTIONS: Fall and Other: no bending forward (skull fx), cannot lift more than 10lbs until November 18, 2023   PATIENT GOALS: "To reduce my symptoms."  OBJECTIVE:   DIAGNOSTIC FINDINGS:   CT Head wo Contrast 09/08/2023 IMPRESSION: 1. Expected evolution of multicompartmental intracranial hemorrhage since 08/28/2023, with no residual hyperdense blood. 2. 3 mm thick hypodense right parietal convexity subdural collection is unchanged and may reflect a subdural hygroma or evolving subdural hematoma. 3. Unchanged right temporal/parietal bone fracture.  CT Lumbar Spine wo Contrast 09/08/2023: IMPRESSION: 1. No acute fracture or  traumatic malalignment of the lumbar spine. 2. Postsurgical changes reflecting posterior instrumented fusion and decompression at L5-S1 without residual spinal canal or neural foraminal stenosis. 3. Adjacent segment disease at L3-L4 resulting in at least moderate spinal canal stenosis and moderate bilateral neural foraminal stenosis.  CT of head from 08/27/23   IMPRESSION: 1. No significant interval change in bilateral frontal convexity subdural fluid collection measuring up to 6 mm. 2. Redemonstrated subarachnoid hemorrhage and hemorrhagic contusions along the bilateral anterior frontal lobes, slightly increased on the left measuring up 2.1 cm ( previously 1.9 cm), and unchanged along the right frontal lobe. Compared to prior exam there is interval increase in conspicuity of subarachnoid blood products along the bilateral parietal lobes, likely due to redistribution. No evidence of intraventricular extension. No hydrocephalus. 3. Redemonstrated acute nondisplaced fracture involving the right temporal bone.   VITALS Vitals:   11/17/23 1411  BP: (!) 125/59  Pulse: (!) 59     TODAY'S TREATMENT:           Assessed vitals (see above) and WNL for therapy.   Treadmill:  - endurance training  - 1.5 mph, 12 min  - RPE: 2/10, was able to carry a conversation throughout without significant SOB   Leg press:  - strength training  - 60 lbs double leg 2x12 - 40 lbs single leg 2x12 per side   Shoulder front raise: - AAROM  - 2 lb bar   - 1 set neutral grip   - 1 set underhand grip    PATIENT EDUCATION: Education details: See above  Person educated: Patient and Spouse Education method: Explanation, Demonstration, and Handouts Education comprehension: verbalized understanding, returned demonstration, and needs further education  HOME EXERCISE PROGRAM:  Access Code: [email or web address] URL: [email or web address] Date: 09/08/2023 Prepared by: Maryruth Eve  Exercises -  Corner Balance Feet Together With Eyes Closed  - 1 x daily - 7 x weekly - 3 sets - 30 seconds hold - Pencil Pushups  - 1 x daily - 7 x weekly - 3 sets - 10 reps - Seated Horizontal Saccades  - 1 x daily - 7 x weekly - 3 sets - 10 reps - Seated Vertical Saccades  - 1 x daily - 7 x weekly - 3 sets - 10 reps -[place] eye tracking (1 paragraph)  -King Devick's Saccades 1x a day - Side Stepping with Resistance at Thighs and Counter Support  - 1 x daily - 7  x weekly - 3 sets - 10 reps - Forward Backward Monster Walk with Band at [employer] and Counter Support  - 1 x daily - 7 x weekly - 3 sets - 10 reps - Supine Bridge  - 1 x daily - 7 x weekly - 3 sets - 10 reps - Clamshell  - 1 x daily - 7 x weekly - 3 sets - 10 reps - Squat with Chair Touch  - 1 x daily - 7 x weekly - 3 sets - 10 reps  Education on SPOT it game and HART chart use, VOR x 1 viewing 3 x 5 rounds Brock string ~3x a day with spouse guiding through   GOALS: Goals reviewed with patient? Yes  SHORT TERM GOALS: Target date: 09/29/2023   Pt will be independent with initial HEP for improved balance, transfers and gait.  Baseline: not established on eval; provided Goal status: MET  2.  Patient will improve RPQ-3 score to </= 2 per item to indicate reduced severity of post-concussive symptoms to progress towards PLOF.    Baseline: 3/4, 3/4 Goal status: NOT MET  3.  5x STS to be assessed and STG/LTG updated  Baseline: 15.65 seconds without UE use (SBA) Goal status: Discontinued STG due to how well performed   4.  Patient will improve mCTSIB score to 100/120 to indicate improved integration of vestibular, proprioceptive, and visual balance systems during static balance tasks in order to reduce risk for falls.   Baseline: 86/120; 120/120 Goal status: MET   LONG TERM GOALS: Target date: 10/27/2023   Pt will be independent with final HEP for improved balance, transfers and gait.  Baseline: Reports partial compliance  Goal status:  IN PROGRESS   2.  Patient will improve RPQ-13 score to </= 2 per item to indicate reduced severity of post-concussive symptoms to progress towards PLOF.   Baseline: greatest deficit reported 4/4, greatest defciti 3/4 on fatige and feeling depressed Goal status: NOT MET BUT PROGRESSED   3.  Patient will improve their 5x Sit to Stand score to less than 13 seconds to demonstrate a decreased risk for falls and improved LE strength.   Baseline: 15.65 seconds without UE use (SBA); 9.2 seconds without UE use (SBA); improved to 9.2 seconds  Goal status: MET  4. Patient will improve mCTSIB score to 120/120 to indicate improved integration of vestibular, proprioceptive, and visual balance systems during static balance tasks in order to reduce risk for falls.   Baseline: 86/120; 120/120  Goal status: MET  NEW LONG TERM GOALS FOR UPDATED POC: Target date: 12/16/2023  Pt will ambulate greater than or equal to 1450' feet on with no AD mod I for improved cardiovascular endurance and BLE strength.   Baseline: 1207' no AD and SBA Goal status: INITIAL  2.  Pt will independent w/gym-based HEP for improved functional BUE/BLE strength, endurance and return to work.   Baseline: Pt can do AROM only w/LUE Goal status: INITIAL    ASSESSMENT:  CLINICAL IMPRESSION: Patient seen today for skilled physical therapy session with emphasis on endurance training and LE strength training. Patient did well with treadmill training, as he was able to carry a conversation while walking for 12 minutes with no significant SOB. Patient was able to complete leg press exercise with proper form and educated to continue using this machine at the gym. Shoulder front raise exercise showed notable decrease in muscle strength on L UE, as to be expected from decreased use from lifting precautions.  Educated patient on using his R UE more to tolerance. Continue POC for improved endurance and increased strength.   OBJECTIVE  IMPAIRMENTS: Abnormal gait, decreased activity tolerance, decreased balance, decreased cognition, decreased coordination, decreased endurance, decreased knowledge of condition, decreased knowledge of use of DME, decreased mobility, difficulty walking, decreased strength, decreased safety awareness, dizziness, impaired perceived functional ability, impaired vision/preception, and pain  ACTIVITY LIMITATIONS: carrying, lifting, bending, sleeping, stairs, transfers, hygiene/grooming, locomotion level, and caring for others  PARTICIPATION LIMITATIONS: meal prep, cleaning, laundry, interpersonal relationship, driving, shopping, community activity, occupation, and yard work  PERSONAL FACTORS: Fitness, Past/current experiences, and 3+ comorbidities: Bradycardia, post-concussion w/LOC,   are also affecting patient's functional outcome.   REHAB POTENTIAL: Good  CLINICAL DECISION MAKING: Unstable/unpredictable  EVALUATION COMPLEXITY: High  PLAN:  PT FREQUENCY: 1x/week (recert)  PT DURATION: 4 weeks (recert)   PLANNED INTERVENTIONS: Therapeutic exercises, Therapeutic activity, Neuromuscular re-education, Balance training, Gait training, Patient/Family education, Self Care, Joint mobilization, Stair training, Vestibular training, Canalith repositioning, DME instructions, Aquatic Therapy, Dry Needling, Electrical stimulation, Manual therapy, and Re-evaluation  PLAN FOR NEXT SESSION:progress occulomotor/vestibular exercises (VORx1 as tolerated, Brock string), progress corner balance (EC on foam, tandem with head turns as able etc), consider neuropthomology referral pending progress; did OT/speech get scheduled, HART chart with balance work, work on Patent attorney scanning with balance training or dynamic gait/balance without AD with visual scanning, midline orientation,   Continue to work on gait without AD and progress higher level balance tasks as able. Pt would like to work on functional hip/LE strength    Jaya Lapka M Yaremi Stahlman, SPT  11/17/2023, 3:01 PM

## 2023-11-18 ENCOUNTER — Encounter: Payer: PPO | Admitting: Speech Pathology

## 2023-11-24 ENCOUNTER — Ambulatory Visit: Payer: PPO | Attending: Family Medicine | Admitting: Physical Therapy

## 2023-11-24 DIAGNOSIS — R2681 Unsteadiness on feet: Secondary | ICD-10-CM | POA: Diagnosis not present

## 2023-11-24 DIAGNOSIS — M6281 Muscle weakness (generalized): Secondary | ICD-10-CM | POA: Diagnosis not present

## 2023-11-24 DIAGNOSIS — Z9181 History of falling: Secondary | ICD-10-CM | POA: Insufficient documentation

## 2023-11-24 DIAGNOSIS — R2689 Other abnormalities of gait and mobility: Secondary | ICD-10-CM | POA: Diagnosis not present

## 2023-11-24 NOTE — Therapy (Signed)
OUTPATIENT PHYSICAL THERAPY NEURO TREATMENT   Patient Name: Gregory [name] MRN: [id number] DOB:Apr 29, 1951, 72 y.o., male Today's Date: 11/24/2023   PCP: Ronnald Nian, MD REFERRING PROVIDER: Willeen Niece, MD  END OF SESSION:  PT End of Session - 11/24/23 1232     Visit Number 20    Number of Visits 22    Date for PT Re-Evaluation 12/16/23    Authorization Type [employer]    PT Start Time 1231    PT Stop Time 1315    PT Time Calculation (min) 44 min    Equipment Utilized During Treatment --    Activity Tolerance Patient tolerated treatment well    Behavior During Therapy WFL for tasks assessed/performed                Past Medical History:  Diagnosis Date   Alcohol abuse    Aortic stenosis    Arthritis    Asthma    exercise indused or pollen-uses inhaler dail yand has rescue inhaler if needed   Bipolar disorder (HCC)    CAD (coronary artery disease)    coronary calcifications 08/2013 CTA   Carotid artery occlusion    Cataract    bilateral sx   Chronic back pain    Claudication (HCC)    Complication of anesthesia 10/16/2020   "had a panic attack when the mask was put on me"" I do not think I was given anything  before, I need something to calm me down"   Depression    Family history of skin cancer    Heart murmur    as an infant   Hepatitis    h/o 1970,doesn't remember which type,1990 epstain-barr   Herpes zoster without complication 09/05/2019   Hyperlipidemia    Hypertension    Neuromuscular disorder (HCC)    Obsessive compulsive disorder    PAD (peripheral artery disease) (HCC)    Peripheral neuropathy    Pneumonia    RBBB    RBBB (right bundle branch block with left anterior fascicular block)    Seasonal allergies    Severe aortic stenosis    Sleep apnea    does not wear CPAP   Smoker    Spinal stenosis at L4-L5 level    Tobacco abuse    Tobacco use disorder    Past Surgical History:  Procedure Laterality Date   ANAL  FISTULECTOMY  09/25/2011   AORTA - BILATERAL FEMORAL ARTERY BYPASS GRAFT N/A 10/03/2020   Procedure: AORTA BIFEMORAL BYPASS GRAFT USING A HEMASHIELD GOLD BIFURCATED 14 X 7mm GRAFT AND INFERIOR MESENTERIC ARTERY REIMPLANTATION;  Surgeon: Nada Libman, MD;  Location: [facility] OR;  Service: Vascular;  Laterality: N/A;   AORTIC VALVE REPLACEMENT N/A 11/24/2019   Procedure: AORTIC VALVE REPLACEMENT (AVR) using INSPIRIS Resilia 23 MM Bioprosthetic Aortic Valve.;  Surgeon: Alleen Borne, MD;  Location: [facility] OR;  Service: Open Heart Surgery;  Laterality: N/A;   APPLICATION OF WOUND VAC Left 10/17/2020   Procedure: APPLICATION OF WOUND VAC;  Surgeon: Nada Libman, MD;  Location: [facility] OR;  Service: Vascular;  Laterality: Left;   BACK SURGERY     2015   CATARACT EXTRACTION, BILATERAL  2022   COLONOSCOPY  2009   MS-F/V-mov(exc)-HPP   CORONARY ARTERY BYPASS GRAFT N/A 11/24/2019   Procedure: CORONARY ARTERY BYPASS GRAFTING (CABG) using LIMA to LAD.;  Surgeon: Alleen Borne, MD;  Location: [facility] OR;  Service: Open Heart Surgery;  Laterality: N/A;   ELBOW SURGERY  bilaterally for cubital tunnel   ENDARTERECTOMY N/A 10/03/2020   Procedure: AORTIC ENDARTERECTOMY;  Surgeon: Nada Libman, MD;  Location: [facility];  Service: Vascular;  Laterality: N/A;   ENDARTERECTOMY FEMORAL Bilateral 10/03/2020   Procedure: BILATERAL FEMORAL ENDARTERECTOMY;  Surgeon: Nada Libman, MD;  Location: [facility];  Service: Vascular;  Laterality: Bilateral;   HERNIA REPAIR     with mesh   INCISION AND DRAINAGE Left 01/02/2021   Procedure: INCISION AND DRAINAGE LEFT GROIN WITH STIMULAN BEADS;  Surgeon: Nada Libman, MD;  Location: [facility];  Service: Vascular;  Laterality: Left;   INCISION AND DRAINAGE OF WOUND Left 10/17/2020   Procedure: EXPLORATION LEFT GROIN WOUND;  Surgeon: Nada Libman, MD;  Location: [facility];  Service: Vascular;  Laterality: Left;   MAXIMUM ACCESS (MAS)POSTERIOR LUMBAR INTERBODY FUSION (PLIF) 1 LEVEL N/A  10/25/2014   Procedure: LUMBAR FOUR TO FIVE MAXIMUM ACCESS (MAS) POSTERIOR LUMBAR INTERBODY FUSION (PLIF) 1 LEVEL;  Surgeon: Tia Alert, MD;  Location: [facility];  Service: Neurosurgery;  Laterality: N/A;   PACEMAKER IMPLANT N/A 10/06/2023   Procedure: PACEMAKER IMPLANT;  Surgeon: Regan Lemming, MD;  Location: [facility]AS[facility];  Service: Cardiovascular;  Laterality: N/A;   RIGHT/LEFT HEART CATH AND CORONARY ANGIOGRAPHY N/A 11/10/2019   Procedure: RIGHT/LEFT HEART CATH AND CORONARY ANGIOGRAPHY;  Surgeon: Swaziland, [name], MD;  Location: [facility] INVASIVE CV LAB;  Service: Cardiovascular;  Laterality: N/A;   SKIN BIOPSY Left 10/12/2018   shave forehead Hypertrophic actinic kertosis with features of a verruca   TEE WITHOUT CARDIOVERSION N/A 11/24/2019   Procedure: TRANSESOPHAGEAL ECHOCARDIOGRAM (TEE);  Surgeon: Alleen Borne, MD;  Location: [facility] OR;  Service: Open Heart Surgery;  Laterality: N/A;   TONSILLECTOMY  age 47   VASECTOMY     X 2   VASECTOMY REVERSAL     Patient Active Problem List   Diagnosis Date Noted   Symptomatic bradycardia 10/05/2023   Subarachnoid hemorrhage following injury (HCC) 08/26/2023   Skull fracture with cerebral contusion (HCC) 08/26/2023   Temporal bone fracture (HCC) 08/26/2023   Aortic atherosclerosis (HCC) 05/26/2023   Acute non-recurrent pansinusitis 01/04/2023   Perennial allergic rhinitis 07/09/2022   Asthma-COPD overlap syndrome (HCC) 04/21/2022   Recurrent infections 04/21/2022   S/P aortobifemoral bypass surgery 04/03/2021   S/P CABG x 1 11/24/2019   S/P aortic valve replacement with bioprosthetic valve 11/24/2019   PAD (peripheral artery disease) (HCC)    Mild persistent asthma 10/29/2019   RBBB (right bundle branch block with left anterior fascicular block)    Peripheral neuropathy    S/P lumbar spinal fusion 10/25/2014   Recovering alcoholic in remission (HCC) 08/10/2014   Substance induced mood disorder (HCC) 06/30/2014    Substance-induced sleep disorder (HCC) 06/30/2014   Obsessive compulsive disorder    OSA (obstructive sleep apnea) 01/20/2014   CAD (coronary artery disease)    Mentally disabled 11/25/2012   Hyperlipidemia with target LDL less than 130 11/25/2012   Bipolar disorder (HCC)    Hypertension    Allergic rhinitis due to pollen 08/11/2011    ONSET DATE: 08/28/2023, resume orders 10/13/2023 provided by Dr. Loman Brooklyn, MD  REFERRING DIAG: R55 (ICD-10-CM) - Syncope, unspecified syncope type I60.9 (ICD-10-CM) - Subarachnoid hemorrhage (HCC)  THERAPY DIAG:  Unsteadiness on feet  Muscle weakness (generalized)  Other abnormalities of gait and mobility  Rationale for Evaluation and Treatment: Rehabilitation  SUBJECTIVE:  SUBJECTIVE STATEMENT: Patient reports doing well. Had a good Thanksgiving. Went to work on Friday and Saturday and it went well, had people to lift for him and a chair to rest. Was tired afterwards but otherwise no issues.   Pt accompanied by:  Wife, Chales Abrahams   PERTINENT HISTORY:  pacemaker placement 09/29/2023, CABG, aortic valve replacement (2021)  PAIN:  Are you having pain? No  PRECAUTIONS: Fall and Other: no bending forward (skull fx), is able to lift 10# as of 11/29 and will increase 10% per week    PATIENT GOALS: "To reduce my symptoms."  OBJECTIVE:   DIAGNOSTIC FINDINGS:   CT Head wo Contrast 09/08/2023 IMPRESSION: 1. Expected evolution of multicompartmental intracranial hemorrhage since 08/28/2023, with no residual hyperdense blood. 2. 3 mm thick hypodense right parietal convexity subdural collection is unchanged and may reflect a subdural hygroma or evolving subdural hematoma. 3. Unchanged right temporal/parietal bone fracture.  CT Lumbar Spine wo Contrast  09/08/2023: IMPRESSION: 1. No acute fracture or traumatic malalignment of the lumbar spine. 2. Postsurgical changes reflecting posterior instrumented fusion and decompression at L5-S1 without residual spinal canal or neural foraminal stenosis. 3. Adjacent segment disease at L3-L4 resulting in at least moderate spinal canal stenosis and moderate bilateral neural foraminal stenosis.  CT of head from 08/27/23   IMPRESSION: 1. No significant interval change in bilateral frontal convexity subdural fluid collection measuring up to 6 mm. 2. Redemonstrated subarachnoid hemorrhage and hemorrhagic contusions along the bilateral anterior frontal lobes, slightly increased on the left measuring up 2.1 cm ( previously 1.9 cm), and unchanged along the right frontal lobe. Compared to prior exam there is interval increase in conspicuity of subarachnoid blood products along the bilateral parietal lobes, likely due to redistribution. No evidence of intraventricular extension. No hydrocephalus. 3. Redemonstrated acute nondisplaced fracture involving the right temporal bone.   VITALS There were no vitals filed for this visit.    TODAY'S TREATMENT:           Ther Act  Discussed importance of progressive overload at the gym and emphasized pt should begin using LUE on various equipment (elliptical, UE machines) to reduce risk of learned nonuse of arm. Pt verbalized understanding.  Reviewed parameters of lifting and job requirements. Pt has good understanding of his limitations and verbalized ways to slowly progress back to PLOF.   Ther Ex  Floor press on mat w/5# DBs, 3x12 reps, for improved chest strength, shoulder stability and return to UE work at gym. Pt appropriately challenged by this, w/notable trembling in BUEs, but no compensatio strategies noted.  Narrow chest presses w/5# Dbs, 2x15 reps, for improved tricep strength. Pt denied pain or discomfort w/movement.    PATIENT EDUCATION: Education  details: See above  Person educated: Patient and Spouse Education method: Medical illustrator Education comprehension: verbalized understanding and needs further education  HOME EXERCISE PROGRAM:  Access Code: [email or web address] URL: [email or web address] Date: 09/08/2023 Prepared by: Maryruth Eve  Exercises - Corner Balance Feet Together With Eyes Closed  - 1 x daily - 7 x weekly - 3 sets - 30 seconds hold - Pencil Pushups  - 1 x daily - 7 x weekly - 3 sets - 10 reps - Seated Horizontal Saccades  - 1 x daily - 7 x weekly - 3 sets - 10 reps - Seated Vertical Saccades  - 1 x daily - 7 x weekly - 3 sets - 10 reps -[place] eye tracking (1 paragraph)  -King Devick's Saccades 1x a day - Side  Stepping with Resistance at Thighs and Counter Support  - 1 x daily - 7 x weekly - 3 sets - 10 reps - Forward Backward Monster Walk with Band at [employer] and Counter Support  - 1 x daily - 7 x weekly - 3 sets - 10 reps - Supine Bridge  - 1 x daily - 7 x weekly - 3 sets - 10 reps - Clamshell  - 1 x daily - 7 x weekly - 3 sets - 10 reps - Squat with Chair Touch  - 1 x daily - 7 x weekly - 3 sets - 10 reps  Education on SPOT it game and HART chart use, VOR x 1 viewing 3 x 5 rounds Brock string ~3x a day with spouse guiding through   GOALS: Goals reviewed with patient? Yes  SHORT TERM GOALS: Target date: 09/29/2023   Pt will be independent with initial HEP for improved balance, transfers and gait.  Baseline: not established on eval; provided Goal status: MET  2.  Patient will improve RPQ-3 score to </= 2 per item to indicate reduced severity of post-concussive symptoms to progress towards PLOF.    Baseline: 3/4, 3/4 Goal status: NOT MET  3.  5x STS to be assessed and STG/LTG updated  Baseline: 15.65 seconds without UE use (SBA) Goal status: Discontinued STG due to how well performed   4.  Patient will improve mCTSIB score to 100/120 to indicate improved integration of vestibular,  proprioceptive, and visual balance systems during static balance tasks in order to reduce risk for falls.   Baseline: 86/120; 120/120 Goal status: MET   LONG TERM GOALS: Target date: 10/27/2023   Pt will be independent with final HEP for improved balance, transfers and gait.  Baseline: Reports partial compliance  Goal status: IN PROGRESS   2.  Patient will improve RPQ-13 score to </= 2 per item to indicate reduced severity of post-concussive symptoms to progress towards PLOF.   Baseline: greatest deficit reported 4/4, greatest defciti 3/4 on fatige and feeling depressed Goal status: NOT MET BUT PROGRESSED   3.  Patient will improve their 5x Sit to Stand score to less than 13 seconds to demonstrate a decreased risk for falls and improved LE strength.   Baseline: 15.65 seconds without UE use (SBA); 9.2 seconds without UE use (SBA); improved to 9.2 seconds  Goal status: MET  4. Patient will improve mCTSIB score to 120/120 to indicate improved integration of vestibular, proprioceptive, and visual balance systems during static balance tasks in order to reduce risk for falls.   Baseline: 86/120; 120/120  Goal status: MET  NEW LONG TERM GOALS FOR UPDATED POC: Target date: 12/16/2023  Pt will ambulate greater than or equal to 1450' feet on with no AD mod I for improved cardiovascular endurance and BLE strength.   Baseline: 1207' no AD and SBA Goal status: INITIAL  2.  Pt will independent w/gym-based HEP for improved functional BUE/BLE strength, endurance and return to work.   Baseline: Pt can do AROM only w/LUE Goal status: INITIAL    ASSESSMENT:  CLINICAL IMPRESSION: Emphasis of skilled PT session on UE strength and discussing proper lifting parameters. Pt reports he worked on Friday and Saturday w/no issues, was fatigued following shifts. Pt verbalizes good understanding of lifting restrictions but is encouraged to work on lifting up to 10# w/LUE, as he is trending towards  learned nonuse of L arm following pacemaker placement. Pt to work on AROM and light lifting for UE  in gym this week. Continue POC.   OBJECTIVE IMPAIRMENTS: Abnormal gait, decreased activity tolerance, decreased balance, decreased cognition, decreased coordination, decreased endurance, decreased knowledge of condition, decreased knowledge of use of DME, decreased mobility, difficulty walking, decreased strength, decreased safety awareness, dizziness, impaired perceived functional ability, impaired vision/preception, and pain  ACTIVITY LIMITATIONS: carrying, lifting, bending, sleeping, stairs, transfers, hygiene/grooming, locomotion level, and caring for others  PARTICIPATION LIMITATIONS: meal prep, cleaning, laundry, interpersonal relationship, driving, shopping, community activity, occupation, and yard work  PERSONAL FACTORS: Fitness, Past/current experiences, and 3+ comorbidities: Bradycardia, post-concussion w/LOC,   are also affecting patient's functional outcome.   REHAB POTENTIAL: Good  CLINICAL DECISION MAKING: Unstable/unpredictable  EVALUATION COMPLEXITY: High  PLAN:  PT FREQUENCY: 1x/week (recert)  PT DURATION: 4 weeks (recert)   PLANNED INTERVENTIONS: Therapeutic exercises, Therapeutic activity, Neuromuscular re-education, Balance training, Gait training, Patient/Family education, Self Care, Joint mobilization, Stair training, Vestibular training, Canalith repositioning, DME instructions, Aquatic Therapy, Dry Needling, Electrical stimulation, Manual therapy, and Re-evaluation  PLAN FOR NEXT SESSION:progress occulomotor/vestibular exercises (VORx1 as tolerated, Brock string), progress corner balance (EC on foam, tandem with head turns as able etc), consider neuropthomology referral pending progress; did OT/speech get scheduled, HART chart with balance work, work on Patent attorney scanning with balance training or dynamic gait/balance without AD with visual scanning, midline orientation,    Continue to work on gait without AD and progress higher level balance tasks as able. Pt would like to work on functional hip/LE strength   Kieana Livesay E Alcee Sipos, PT, DPT  11/24/2023, 1:16 PM

## 2023-12-01 ENCOUNTER — Ambulatory Visit: Payer: PPO

## 2023-12-02 ENCOUNTER — Other Ambulatory Visit (HOSPITAL_COMMUNITY): Payer: Self-pay

## 2023-12-02 ENCOUNTER — Telehealth: Payer: Self-pay | Admitting: *Deleted

## 2023-12-02 MED ORDER — AMOXICILLIN 500 MG PO CAPS
500.0000 mg | ORAL_CAPSULE | Freq: Two times a day (BID) | ORAL | 1 refills | Status: DC
Start: 2023-12-02 — End: 2023-12-04
  Filled 2023-12-02 (×2): qty 20, 10d supply, fill #0

## 2023-12-02 NOTE — Telephone Encounter (Signed)
   Pre-operative Risk Assessment    Patient Name: Gregory [name]  DOB: 06-08-1951 MRN: [id number]    DATE OF LAST VISIT: 11/09/23 Edd Fabian, FNP DATE OF NEXT VISIT: 01/19/24 DR. CAMNITZ Request for Surgical Clearance    Procedure:   CROWN AND BRIDGE WORK; NO TEETH ARE TO BE EXTRACTED AT THIS TIME; POSSIBLE ROOT CANAL IF PT'S SYMPTOMS WORSEN  Date of Surgery:  Clearance TBD                                 Surgeon:  DR. Magnus Ivan, DMD Surgeon's Group or Practice Name:  [facility] Phone number:  [phone] Fax number:  [phone]   Type of Clearance Requested:   - Medical ; NONE INDICATED TO BE HELD DMD IS ASKING IF PT WILL NEED SBE/ABX   Type of Anesthesia:  Local  W/EPI   Additional requests/questions:    Elpidio Anis   12/02/2023, 12:42 PM

## 2023-12-02 NOTE — Telephone Encounter (Signed)
   Patient Name: Gregory [name]  DOB: 1951/03/13 MRN: [id number]  Primary Cardiologist: Peter Swaziland, MD  Chart reviewed as part of pre-operative protocol coverage.   SBE prophylaxis is required for the patient from a cardiac standpoint.  Patient had updated prescription sent to designated pharmacy on file.  I will route this recommendation to the requesting party via Epic fax function and remove from pre-op pool.  Please call with questions.  Napoleon Form, Leodis Rains, NP 12/02/2023, 12:55 PM

## 2023-12-03 ENCOUNTER — Ambulatory Visit: Payer: PPO | Admitting: Physical Therapy

## 2023-12-03 ENCOUNTER — Encounter: Payer: Self-pay | Admitting: Physical Therapy

## 2023-12-03 DIAGNOSIS — M6281 Muscle weakness (generalized): Secondary | ICD-10-CM

## 2023-12-03 DIAGNOSIS — Z9181 History of falling: Secondary | ICD-10-CM

## 2023-12-03 DIAGNOSIS — R2689 Other abnormalities of gait and mobility: Secondary | ICD-10-CM

## 2023-12-03 DIAGNOSIS — R2681 Unsteadiness on feet: Secondary | ICD-10-CM

## 2023-12-03 NOTE — Therapy (Signed)
OUTPATIENT PHYSICAL THERAPY NEURO TREATMENT   Patient Name: Gregory [name] MRN: [id number] DOB:May 23, 1951, 72 y.o., male Today's Date: 12/03/2023   PCP: Ronnald Nian, MD REFERRING PROVIDER: Willeen Niece, MD  END OF SESSION:  PT End of Session - 12/03/23 1534     Visit Number 21    Number of Visits 22    Date for PT Re-Evaluation 12/16/23    Authorization Type [employer]    PT Start Time 1533    PT Stop Time 1615    PT Time Calculation (min) 42 min    Equipment Utilized During Treatment Gait belt    Activity Tolerance Patient tolerated treatment well    Behavior During Therapy WFL for tasks assessed/performed             Past Medical History:  Diagnosis Date   Alcohol abuse    Aortic stenosis    Arthritis    Asthma    exercise indused or pollen-uses inhaler dail yand has rescue inhaler if needed   Bipolar disorder (HCC)    CAD (coronary artery disease)    coronary calcifications 08/2013 CTA   Carotid artery occlusion    Cataract    bilateral sx   Chronic back pain    Claudication (HCC)    Complication of anesthesia 10/16/2020   "had a panic attack when the mask was put on me"" I do not think I was given anything  before, I need something to calm me down"   Depression    Family history of skin cancer    Heart murmur    as an infant   Hepatitis    h/o 1970,doesn't remember which type,1990 epstain-barr   Herpes zoster without complication 09/05/2019   Hyperlipidemia    Hypertension    Neuromuscular disorder (HCC)    Obsessive compulsive disorder    PAD (peripheral artery disease) (HCC)    Peripheral neuropathy    Pneumonia    RBBB    RBBB (right bundle branch block with left anterior fascicular block)    Seasonal allergies    Severe aortic stenosis    Sleep apnea    does not wear CPAP   Smoker    Spinal stenosis at L4-L5 level    Tobacco abuse    Tobacco use disorder    Past Surgical History:  Procedure Laterality Date   ANAL  FISTULECTOMY  09/25/2011   AORTA - BILATERAL FEMORAL ARTERY BYPASS GRAFT N/A 10/03/2020   Procedure: AORTA BIFEMORAL BYPASS GRAFT USING A HEMASHIELD GOLD BIFURCATED 14 X 7mm GRAFT AND INFERIOR MESENTERIC ARTERY REIMPLANTATION;  Surgeon: Nada Libman, MD;  Location: [facility] OR;  Service: Vascular;  Laterality: N/A;   AORTIC VALVE REPLACEMENT N/A 11/24/2019   Procedure: AORTIC VALVE REPLACEMENT (AVR) using INSPIRIS Resilia 23 MM Bioprosthetic Aortic Valve.;  Surgeon: Alleen Borne, MD;  Location: [facility] OR;  Service: Open Heart Surgery;  Laterality: N/A;   APPLICATION OF WOUND VAC Left 10/17/2020   Procedure: APPLICATION OF WOUND VAC;  Surgeon: Nada Libman, MD;  Location: [facility] OR;  Service: Vascular;  Laterality: Left;   BACK SURGERY     2015   CATARACT EXTRACTION, BILATERAL  2022   COLONOSCOPY  2009   MS-F/V-mov(exc)-HPP   CORONARY ARTERY BYPASS GRAFT N/A 11/24/2019   Procedure: CORONARY ARTERY BYPASS GRAFTING (CABG) using LIMA to LAD.;  Surgeon: Alleen Borne, MD;  Location: [facility];  Service: Open Heart Surgery;  Laterality: N/A;   ELBOW SURGERY  bilaterally for cubital tunnel   ENDARTERECTOMY N/A 10/03/2020   Procedure: AORTIC ENDARTERECTOMY;  Surgeon: Nada Libman, MD;  Location: [facility];  Service: Vascular;  Laterality: N/A;   ENDARTERECTOMY FEMORAL Bilateral 10/03/2020   Procedure: BILATERAL FEMORAL ENDARTERECTOMY;  Surgeon: Nada Libman, MD;  Location: [facility];  Service: Vascular;  Laterality: Bilateral;   HERNIA REPAIR     with mesh   INCISION AND DRAINAGE Left 01/02/2021   Procedure: INCISION AND DRAINAGE LEFT GROIN WITH STIMULAN BEADS;  Surgeon: Nada Libman, MD;  Location: [facility];  Service: Vascular;  Laterality: Left;   INCISION AND DRAINAGE OF WOUND Left 10/17/2020   Procedure: EXPLORATION LEFT GROIN WOUND;  Surgeon: Nada Libman, MD;  Location: [facility];  Service: Vascular;  Laterality: Left;   MAXIMUM ACCESS (MAS)POSTERIOR LUMBAR INTERBODY FUSION (PLIF) 1 LEVEL N/A  10/25/2014   Procedure: LUMBAR FOUR TO FIVE MAXIMUM ACCESS (MAS) POSTERIOR LUMBAR INTERBODY FUSION (PLIF) 1 LEVEL;  Surgeon: Tia Alert, MD;  Location: [facility]O [facility];  Service: Neurosurgery;  Laterality: N/A;   PACEMAKER IMPLANT N/A 10/06/2023   Procedure: PACEMAKER IMPLANT;  Surgeon: Regan Lemming, MD;  Location: [facility] INVASIVE CV LAB;  Service: Cardiovascular;  Laterality: N/A;   RIGHT/LEFT HEART CATH AND CORONARY ANGIOGRAPHY N/A 11/10/2019   Procedure: RIGHT/LEFT HEART CATH AND CORONARY ANGIOGRAPHY;  Surgeon: Swazil[place], [name], MD;  [facility] INVASIVE CV LAB;  Service: Cardiovascular;  Laterality: N/A;   SKIN BIOPSY Left 10/12/2018   shave forehead Hypertrophic actinic kertosis with features of a verruca   TEE WITHOUT CARDIOVERSION N/A 11/24/2019   Procedure: TRANSESOPHAGEAL ECHOCARDIOGRAM (TEE);  Surgeon: Alleen Borne, MD;  Location: [facility] OR;  Service: Open Heart Surgery;  Laterality: N/A;   TONSILLECTOMY  age 70   VASECTOMY     X 2   VASECTOMY REVERSAL     Patient Active Problem List   Diagnosis Date Noted   Symptomatic bradycardia 10/05/2023   Subarachnoid hemorrhage following injury (HCC) 08/26/2023   Skull fracture with cerebral contusion (HCC) 08/26/2023   Temporal bone fracture (HCC) 08/26/2023   Aortic atherosclerosis (HCC) 05/26/2023   Acute non-recurrent pansinusitis 01/04/2023   Perennial allergic rhinitis 07/09/2022   Asthma-COPD overlap syndrome (HCC) 04/21/2022   Recurrent infections 04/21/2022   S/P aortobifemoral bypass surgery 04/03/2021   S/P CABG x 1 11/24/2019   S/P aortic valve replacement with bioprosthetic valve 11/24/2019   PAD (peripheral artery disease) (HCC)    Mild persistent asthma 10/29/2019   RBBB (right bundle branch block with left anterior fascicular block)    Peripheral neuropathy    S/P lumbar spinal fusion 10/25/2014   Recovering alcoholic in remission (HCC) 08/10/2014   Substance induced mood disorder (HCC) 06/30/2014    Substance-induced sleep disorder (HCC) 06/30/2014   Obsessive compulsive disorder    OSA (obstructive sleep apnea) 01/20/2014   CAD (coronary artery disease)    Mentally disabled 11/25/2012   Hyperlipidemia with target LDL less than 130 11/25/2012   Bipolar disorder (HCC)    Hypertension    Allergic rhinitis due to pollen 08/11/2011    ONSET DATE: 08/28/2023, resume orders 10/13/2023 provided by Dr. Loman Brooklyn, MD  REFERRING DIAG: R55 (ICD-10-CM) - Syncope, unspecified syncope type I60.9 (ICD-10-CM) - Subarachnoid hemorrhage (HCC)  THERAPY DIAG:  Unsteadiness on feet  Muscle weakness (generalized)  Other abnormalities of gait and mobility  History of falling  Rationale for Evaluation and Treatment: Rehabilitation  SUBJECTIVE:  SUBJECTIVE STATEMENT: Patient reports work still working well. Patient is ambulating without AD. No other acute changes. Patient difficulty recalling lifting precautions thinking he can lift less than can but caregiver reminds him of what he lift.   Pt accompanied by:  Wife, Chales Abrahams   PERTINENT HISTORY:  pacemaker placement 09/29/2023, CABG, aortic valve replacement (2021)  PAIN:  Are you having pain? No  PRECAUTIONS: Fall and Other: no bending forward (skull fx), is able to lift 10# as of 11/29 and will increase 10% per week    PATIENT GOALS: "To reduce my symptoms."  OBJECTIVE:   DIAGNOSTIC FINDINGS:   CT Head wo Contrast 09/08/2023 IMPRESSION: 1. Expected evolution of multicompartmental intracranial hemorrhage since 08/28/2023, with no residual hyperdense blood. 2. 3 mm thick hypodense right parietal convexity subdural collection is unchanged and may reflect a subdural hygroma or evolving subdural hematoma. 3. Unchanged right temporal/parietal bone  fracture.  CT Lumbar Spine wo Contrast 09/08/2023: IMPRESSION: 1. No acute fracture or traumatic malalignment of the lumbar spine. 2. Postsurgical changes reflecting posterior instrumented fusion and decompression at L5-S1 without residual spinal canal or neural foraminal stenosis. 3. Adjacent segment disease at L3-L4 resulting in at least moderate spinal canal stenosis and moderate bilateral neural foraminal stenosis.  CT of head from 08/27/23   IMPRESSION: 1. No significant interval change in bilateral frontal convexity subdural fluid collection measuring up to 6 mm. 2. Redemonstrated subarachnoid hemorrhage and hemorrhagic contusions along the bilateral anterior frontal lobes, slightly increased on the left measuring up 2.1 cm ( previously 1.9 cm), and unchanged along the right frontal lobe. Compared to prior exam there is interval increase in conspicuity of subarachnoid blood products along the bilateral parietal lobes, likely due to redistribution. No evidence of intraventricular extension. No hydrocephalus. 3. Redemonstrated acute nondisplaced fracture involving the right temporal bone.   TODAY'S TREATMENT:           Ther Act  Reviewed importance of progressive overload at the gym as started last session to prevent non-use and discussed post-op precautions. Pt verbalized understanding. Reaching with 2-3lb weights to top shelf and bringing down 5 x 4 weights to simulate loading and unloading dishwasher   Ther Ex   Mirroring therapist with use of green theraband standing: Standing Shoulder Horizontal Abduction with Resistance  - 2 x 10 reps  Standing Shoulder Diagonal Horizontal Abduction 60/120 Degrees with Resistance  - 2 x 10 reps  Shoulder External Rotation and Scapular Retraction with Resistance  - 2 x 10 reps  With 10lb weight mirroring therapist standing: Seated Single Arm Bicep Curls with Rotation and Dumbbell  - 3 sets - 10 reps Standing Pronated Elbow Flexion  with Dumbbell  - 3 sets - 10 reps Standing Bicep Curls Neutral with Dumbbells  - 3 sets - 10 reps  With 3lb weights seated: Chest fly 1 x 10 reps  PATIENT EDUCATION: Education details: See above  Person educated: Patient and Spouse Education method: Medical illustrator Education comprehension: verbalized understanding and needs further education  HOME EXERCISE PROGRAM:  Access Code: [email or web address] URL: [email or web address] Date: 12/03/2023 Prepared by: Maryruth Eve  Exercises - Corner Balance Feet Together With Eyes Closed  - 1 x daily - 7 x weekly - 3 sets - 30 seconds hold - Pencil Pushups  - 1 x daily - 7 x weekly - 3 sets - 10 reps - Seated Horizontal Saccades  - 1 x daily - 7 x weekly - 3 sets - 10 reps - Seated Vertical Saccades  -  1 x daily - 7 x weekly - 3 sets - 10 reps - Side Stepping with Resistance at Thighs and Counter Support  - 1 x daily - 7 x weekly - 3 sets - 10 reps - Forward Backward Monster Walk with Band at Thighs and Counter Support  - 1 x daily - 7 x weekly - 3 sets - 10 reps - Supine Bridge  - 1 x daily - 7 x weekly - 3 sets - 10 reps - Clamshell  - 1 x daily - 7 x weekly - 3 sets - 10 reps - Squat with Chair Touch  - 1 x daily - 7 x weekly - 3 sets - 10 reps - Standing Shoulder Horizontal Abduction with Resistance  - 1 x daily - 4 x weekly - 3 sets - 8-15 reps - Standing Shoulder Diagonal Horizontal Abduction 60/120 Degrees with Resistance  - 1 x daily - 4 x weekly - 3 sets - 8-15 reps - Shoulder External Rotation and Scapular Retraction with Resistance  - 1 x daily - 4 x weekly - 3 sets - 8-15 reps - Seated Single Arm Bicep Curls with Rotation and Dumbbell  - 1 x daily - 3 x weekly - 3 sets - 10 reps - Standing Pronated Elbow Flexion with Dumbbell  - 1 x daily - 3 x weekly - 3 sets - 10 reps - Standing Bicep Curls Neutral with Dumbbells  - 1 x daily - 3 x weekly - 3 sets - 10 reps  Education on SPOT it game and HART chart use, VOR x 1  viewing 3 x 5 rounds Brock string ~3x a day with spouse guiding through   GOALS: Goals reviewed with patient? Yes  SHORT TERM GOALS: Target date: 09/29/2023   Pt will be independent with initial HEP for improved balance, transfers and gait.  Baseline: not established on eval; provided Goal status: MET  2.  Patient will improve RPQ-3 score to </= 2 per item to indicate reduced severity of post-concussive symptoms to progress towards PLOF.    Baseline: 3/4, 3/4 Goal status: NOT MET  3.  5x STS to be assessed and STG/LTG updated  Baseline: 15.65 seconds without UE use (SBA) Goal status: Discontinued STG due to how well performed   4.  Patient will improve mCTSIB score to 100/120 to indicate improved integration of vestibular, proprioceptive, and visual balance systems during static balance tasks in order to reduce risk for falls.   Baseline: 86/120; 120/120 Goal status: MET   LONG TERM GOALS: Target date: 10/27/2023   Pt will be independent with final HEP for improved balance, transfers and gait.  Baseline: Reports partial compliance  Goal status: IN PROGRESS   2.  Patient will improve RPQ-13 score to </= 2 per item to indicate reduced severity of post-concussive symptoms to progress towards PLOF.   Baseline: greatest deficit reported 4/4, greatest defciti 3/4 on fatige and feeling depressed Goal status: NOT MET BUT PROGRESSED   3.  Patient will improve their 5x Sit to Stand score to less than 13 seconds to demonstrate a decreased risk for falls and improved LE strength.   Baseline: 15.65 seconds without UE use (SBA); 9.2 seconds without UE use (SBA); improved to 9.2 seconds  Goal status: MET  4. Patient will improve mCTSIB score to 120/120 to indicate improved integration of vestibular, proprioceptive, and visual balance systems during static balance tasks in order to reduce risk for falls.   Baseline: 86/120; 120/120  Goal  status: MET  NEW LONG TERM GOALS FOR UPDATED POC:  Target date: 12/16/2023  Pt will ambulate greater than or equal to 1450' feet on with no AD mod I for improved cardiovascular endurance and BLE strength.   Baseline: 1207' no AD and SBA Goal status: INITIAL  2.  Pt will independent w/gym-based HEP for improved functional BUE/BLE strength, endurance and return to work.   Baseline: Pt can do AROM only w/LUE Goal status: INITIAL    ASSESSMENT:  CLINICAL IMPRESSION: Emphasis of skilled PT session on UE strength to prevent learned non-use. Reviewed post-op precautions as patient has still poor recall from review last session tending to lift less than he can. Added UE work to [employer]. Patient tolerated well without increase pain though fatigues quickly. Continue POC.   OBJECTIVE IMPAIRMENTS: Abnormal gait, decreased activity tolerance, decreased balance, decreased cognition, decreased coordination, decreased endurance, decreased knowledge of condition, decreased knowledge of use of DME, decreased mobility, difficulty walking, decreased strength, decreased safety awareness, dizziness, impaired perceived functional ability, impaired vision/preception, and pain  ACTIVITY LIMITATIONS: carrying, lifting, bending, sleeping, stairs, transfers, hygiene/grooming, locomotion level, and caring for others  PARTICIPATION LIMITATIONS: meal prep, cleaning, laundry, interpersonal relationship, driving, shopping, community activity, occupation, and yard work  PERSONAL FACTORS: Fitness, Past/current experiences, and 3+ comorbidities: Bradycardia, post-concussion w/LOC,   are also affecting patient's functional outcome.   REHAB POTENTIAL: Good  CLINICAL DECISION MAKING: Unstable/unpredictable  EVALUATION COMPLEXITY: High  PLAN:  PT FREQUENCY: 1x/week (recert)  PT DURATION: 4 weeks (recert)   PLANNED INTERVENTIONS: Therapeutic exercises, Therapeutic activity, Neuromuscular re-education, Balance training, Gait training, Patient/Family education, Self  Care, Joint mobilization, Stair training, Vestibular training, Canalith repositioning, DME instructions, Aquatic Therapy, Dry Needling, Electrical stimulation, Manual therapy, and Re-evaluation  PLAN FOR NEXT SESSION:assess goals and D/C  Maryruth Eve, PT, DPT  12/03/2023, 4:42 PM

## 2023-12-04 ENCOUNTER — Other Ambulatory Visit: Payer: Self-pay | Admitting: Nurse Practitioner

## 2023-12-04 ENCOUNTER — Other Ambulatory Visit (HOSPITAL_COMMUNITY): Payer: Self-pay

## 2023-12-04 MED ORDER — AMOXICILLIN 500 MG PO CAPS: 500.0000 mg | ORAL_CAPSULE | Freq: Two times a day (BID) | ORAL | 1 refills | Status: DC

## 2023-12-07 ENCOUNTER — Ambulatory Visit: Payer: PPO

## 2023-12-07 ENCOUNTER — Other Ambulatory Visit: Payer: Self-pay | Admitting: *Deleted

## 2023-12-07 ENCOUNTER — Encounter (HOSPITAL_COMMUNITY): Payer: PPO

## 2023-12-07 DIAGNOSIS — I779 Disorder of arteries and arterioles, unspecified: Secondary | ICD-10-CM

## 2023-12-08 ENCOUNTER — Ambulatory Visit: Payer: PPO | Admitting: Physical Therapy

## 2023-12-08 DIAGNOSIS — R2681 Unsteadiness on feet: Secondary | ICD-10-CM

## 2023-12-08 DIAGNOSIS — R2689 Other abnormalities of gait and mobility: Secondary | ICD-10-CM

## 2023-12-08 DIAGNOSIS — M6281 Muscle weakness (generalized): Secondary | ICD-10-CM

## 2023-12-08 NOTE — Therapy (Signed)
OUTPATIENT PHYSICAL THERAPY NEURO TREATMENT- DISCHARGE SUMMARY   Patient Name: Gregory [name] MRN: [id number] DOB:22-Jun-1951, 72 y.o., male Today's Date: 12/08/2023   PCP: Ronnald Nian, MD REFERRING PROVIDER: Willeen Niece, MD  PHYSICAL THERAPY DISCHARGE SUMMARY  Visits from Start of Care: 22  Current functional level related to goals / functional outcomes: Mod I w/gait without use of AD. Requires SBA for ADLs at home if fatigued.    Remaining deficits: Impaired short term memory, low fall risk, decreased activity tolerance, global deconditioning    Education / Equipment: HEP   Patient agrees to discharge. Patient goals were met. Patient is being discharged due to meeting the stated rehab goals.   END OF SESSION:  PT End of Session - 12/08/23 0937     Visit Number 22    Number of Visits 22    Date for PT Re-Evaluation 12/16/23    Authorization Type [employer]    PT Start Time [phone]   Therapist running behind   PT Stop Time 1018    PT Time Calculation (min) 42 min    Equipment Utilized During Treatment --    Activity Tolerance Patient tolerated treatment well    Behavior During Therapy WFL for tasks assessed/performed              Past Medical History:  Diagnosis Date   Alcohol abuse    Aortic stenosis    Arthritis    Asthma    exercise indused or pollen-uses inhaler dail yand has rescue inhaler if needed   Bipolar disorder (HCC)    CAD (coronary artery disease)    coronary calcifications 08/2013 CTA   Carotid artery occlusion    Cataract    bilateral sx   Chronic back pain    Claudication (HCC)    Complication of anesthesia 10/16/2020   "had a panic attack when the mask was put on me"" I do not think I was given anything  before, I need something to calm me down"   Depression    Family history of skin cancer    Heart murmur    as an infant   Hepatitis    h/o 1970,doesn't remember which type,1990 epstain-barr   Herpes zoster  without complication 09/05/2019   Hyperlipidemia    Hypertension    Neuromuscular disorder (HCC)    Obsessive compulsive disorder    PAD (peripheral artery disease) (HCC)    Peripheral neuropathy    Pneumonia    RBBB    RBBB (right bundle branch block with left anterior fascicular block)    Seasonal allergies    Severe aortic stenosis    Sleep apnea    does not wear CPAP   Smoker    Spinal stenosis at L4-L5 level    Tobacco abuse    Tobacco use disorder    Past Surgical History:  Procedure Laterality Date   ANAL FISTULECTOMY  09/25/2011   AORTA - BILATERAL FEMORAL ARTERY BYPASS GRAFT N/A 10/03/2020   Procedure: AORTA BIFEMORAL BYPASS GRAFT USING A HEMASHIELD GOLD BIFURCATED 14 X 7mm GRAFT AND INFERIOR MESENTERIC ARTERY REIMPLANTATION;  Surgeon: Nada Libman, MD;  Location: [facility] OR;  Service: Vascular;  Laterality: N/A;   AORTIC VALVE REPLACEMENT N/A 11/24/2019   Procedure: AORTIC VALVE REPLACEMENT (AVR) using INSPIRIS Resilia 23 MM Bioprosthetic Aortic Valve.;  Surgeon: Alleen Borne, MD;  Location: [facility] OR;  Service: Open Heart Surgery;  Laterality: N/A;   APPLICATION OF WOUND VAC Left 10/17/2020   Procedure: APPLICATION  OF WOUND VAC;  Surgeon: Nada Libman, MD;  Location: [facility] OR;  Service: Vascular;  Laterality: Left;   BACK SURGERY     2015   CATARACT EXTRACTION, BILATERAL  2022   COLONOSCOPY  2009   MS-F/V-mov(exc)-HPP   CORONARY ARTERY BYPASS GRAFT N/A 11/24/2019   Procedure: CORONARY ARTERY BYPASS GRAFTING (CABG) using LIMA to LAD.;  Surgeon: Alleen Borne, MD;  Location: [facility] OR;  Service: Open Heart Surgery;  Laterality: N/A;   ELBOW SURGERY     bilaterally for cubital tunnel   ENDARTERECTOMY N/A 10/03/2020   Procedure: AORTIC ENDARTERECTOMY;  Surgeon: Nada Libman, MD;  Location: [facility];  Service: Vascular;  Laterality: N/A;   ENDARTERECTOMY FEMORAL Bilateral 10/03/2020   Procedure: BILATERAL FEMORAL ENDARTERECTOMY;  Surgeon: Nada Libman, MD;  Location:  [facility] OR;  Service: Vascular;  Laterality: Bilateral;   HERNIA REPAIR     with mesh   INCISION AND DRAINAGE Left 01/02/2021   Procedure: INCISION AND DRAINAGE LEFT GROIN WITH STIMULAN BEADS;  Surgeon: Nada Libman, MD;  Location: [facility];  Service: Vascular;  Laterality: Left;   INCISION AND DRAINAGE OF WOUND Left 10/17/2020   Procedure: EXPLORATION LEFT GROIN WOUND;  Surgeon: Nada Libman, MD;  Location: [facility];  Service: Vascular;  Laterality: Left;   MAXIMUM ACCESS (MAS)POSTERIOR LUMBAR INTERBODY FUSION (PLIF) 1 LEVEL N/A 10/25/2014   Procedure: LUMBAR FOUR TO FIVE MAXIMUM ACCESS (MAS) POSTERIOR LUMBAR INTERBODY FUSION (PLIF) 1 LEVEL;  Surgeon: Tia Alert, MD;  Location: [facility]URO [facility];  Service: Neurosurgery;  Laterality: N/A;   PACEMAKER IMPLANT N/A 10/06/2023   Procedure: PACEMAKER IMPLANT;  Surgeon: Regan Lemming, MD;  Location: [facility] INVASIVE CV [facility];  Service: Cardiovascular;  Laterality: N/A;   RIGHT/LEFT HEART CATH AND CORONARY ANGIOGRAPHY N/A 11/10/2019   Procedure: RIGHT/LEFT HEART CATH AND CORONARY ANGIOGRAPHY;  Surgeon: Swaziland, [name], MD;  Location: [facility] INVASIVE CV LAB;  Service: Cardiovascular;  Laterality: N/A;   SKIN BIOPSY Left 10/12/2018   shave forehead Hypertrophic actinic kertosis with features of a verruca   TEE WITHOUT CARDIOVERSION N/A 11/24/2019   Procedure: TRANSESOPHAGEAL ECHOCARDIOGRAM (TEE);  Surgeon: Alleen Borne, MD;  Location: [facility] OR;  Service: Open Heart Surgery;  Laterality: N/A;   TONSILLECTOMY  age 34   VASECTOMY     X 2   VASECTOMY REVERSAL     Patient Active Problem List   Diagnosis Date Noted   Symptomatic bradycardia 10/05/2023   Subarachnoid hemorrhage following injury (HCC) 08/26/2023   Skull fracture with cerebral contusion (HCC) 08/26/2023   Temporal bone fracture (HCC) 08/26/2023   Aortic atherosclerosis (HCC) 05/26/2023   Acute non-recurrent pansinusitis 01/04/2023   Perennial allergic rhinitis 07/09/2022   Asthma-COPD overlap  syndrome (HCC) 04/21/2022   Recurrent infections 04/21/2022   S/P aortobifemoral bypass surgery 04/03/2021   S/P CABG x 1 11/24/2019   S/P aortic valve replacement with bioprosthetic valve 11/24/2019   PAD (peripheral artery disease) (HCC)    Mild persistent asthma 10/29/2019   RBBB (right bundle branch block with left anterior fascicular block)    Peripheral neuropathy    S/P lumbar spinal fusion 10/25/2014   Recovering alcoholic in remission (HCC) 08/10/2014   Substance induced mood disorder (HCC) 06/30/2014   Substance-induced sleep disorder (HCC) 06/30/2014   Obsessive compulsive disorder    OSA (obstructive sleep apnea) 01/20/2014   CAD (coronary artery disease)    Mentally disabled 11/25/2012   Hyperlipidemia with target LDL less than 130 11/25/2012  Bipolar disorder (HCC)    Hypertension    Allergic rhinitis due to pollen 08/11/2011    ONSET DATE: 08/28/2023, resume orders 10/13/2023 provided by Dr. Loman Brooklyn, MD  REFERRING DIAG: R55 (ICD-10-CM) - Syncope, unspecified syncope type I60.9 (ICD-10-CM) - Subarachnoid hemorrhage (HCC)  THERAPY DIAG:  Unsteadiness on feet  Muscle weakness (generalized)  Other abnormalities of gait and mobility  Rationale for Evaluation and Treatment: Rehabilitation  SUBJECTIVE:                                                                                                                                                                                             SUBJECTIVE STATEMENT: Patient reports doing well. Has increased his work shifts from 4 to 6 hours and is now working ~5 days per week, so has not been able to go to the gym much. Denies pain or acute changes.    Pt accompanied by:  Wife, Gregory [name]   PERTINENT HISTORY:  pacemaker placement 09/29/2023, CABG, aortic valve replacement (2021)  PAIN:  Are you having pain? No  PRECAUTIONS: Fall and Other: no bending forward (skull fx), is able to lift 10# as of 11/29 and will  increase 10% per week    PATIENT GOALS: "To reduce my symptoms."  OBJECTIVE:   DIAGNOSTIC FINDINGS:   CT Head wo Contrast 09/08/2023 IMPRESSION: 1. Expected evolution of multicompartmental intracranial hemorrhage since 08/28/2023, with no residual hyperdense blood. 2. 3 mm thick hypodense right parietal convexity subdural collection is unchanged and may reflect a subdural hygroma or evolving subdural hematoma. 3. Unchanged right temporal/parietal bone fracture.  CT Lumbar Spine wo Contrast 09/08/2023: IMPRESSION: 1. No acute fracture or traumatic malalignment of the lumbar spine. 2. Postsurgical changes reflecting posterior instrumented fusion and decompression at L5-S1 without residual spinal canal or neural foraminal stenosis. 3. Adjacent segment disease at L3-L4 resulting in at least moderate spinal canal stenosis and moderate bilateral neural foraminal stenosis.  CT of head from 08/27/23   IMPRESSION: 1. No significant interval change in bilateral frontal convexity subdural fluid collection measuring up to 6 mm. 2. Redemonstrated subarachnoid hemorrhage and hemorrhagic contusions along the bilateral anterior frontal lobes, slightly increased on the left measuring up 2.1 cm ( previously 1.9 cm), and unchanged along the right frontal lobe. Compared to prior exam there is interval increase in conspicuity of subarachnoid blood products along the bilateral parietal lobes, likely due to redistribution. No evidence of intraventricular extension. No hydrocephalus. 3. Redemonstrated acute nondisplaced fracture involving the right temporal bone.   TODAY'S TREATMENT:           Ther Act  Discussed new updates  in pt's work schedule and pt much more self-aware of when he needs to take seated rest breaks or reduce his activity. Pt demonstrates good understanding of his capabilities but continues to have difficulty remembering he can lift more w/his LUE now. Pt reports he would like to  get back to the gym but due to schedule, will be unable to do so until after the holidays. Pt verbalized readiness to DC this date as he is slowly returning to [facility] and work. Pt and wife educated on the importance of time and rest on TBI recovery and to continue to expect good and bad days while he continues to recover. Wife notes that pt has most difficulty w/remembering facts that incorporate numbers (dates, times, etc.) but that she has noted pt's memory is improving.  Attempted but unable to fully perform due to gym being crowded, so had pt ambulate back and forth over 30' line x6 minutes while maintaining conversation w/therapist and performing 180 degree turns w/no instability, slowed cadence or impaired cognition noted.  Educated pt on how to return to PT if mobility or cognition changes. Pt and wife verbalized understanding.     PATIENT EDUCATION: Education details: See above  Person educated: Patient and Spouse Education method: Medical illustrator Education comprehension: verbalized understanding and needs further education  HOME EXERCISE PROGRAM:  Access Code: [email or web address] URL: [email or web address] Date: 12/03/2023 Prepared by: Maryruth Eve  Exercises - Corner Balance Feet Together With Eyes Closed  - 1 x daily - 7 x weekly - 3 sets - 30 seconds hold - Pencil Pushups  - 1 x daily - 7 x weekly - 3 sets - 10 reps - Seated Horizontal Saccades  - 1 x daily - 7 x weekly - 3 sets - 10 reps - Seated Vertical Saccades  - 1 x daily - 7 x weekly - 3 sets - 10 reps - Side Stepping with Resistance at Thighs and Counter Support  - 1 x daily - 7 x weekly - 3 sets - 10 reps - Forward Backward Monster Walk with Band at [employer] and Counter Support  - 1 x daily - 7 x weekly - 3 sets - 10 reps - Supine Bridge  - 1 x daily - 7 x weekly - 3 sets - 10 reps - Clamshell  - 1 x daily - 7 x weekly - 3 sets - 10 reps - Squat with Chair Touch  - 1 x daily - 7 x weekly - 3 sets -  10 reps - Standing Shoulder Horizontal Abduction with Resistance  - 1 x daily - 4 x weekly - 3 sets - 8-15 reps - Standing Shoulder Diagonal Horizontal Abduction 60/120 Degrees with Resistance  - 1 x daily - 4 x weekly - 3 sets - 8-15 reps - Shoulder External Rotation and Scapular Retraction with Resistance  - 1 x daily - 4 x weekly - 3 sets - 8-15 reps - Seated Single Arm Bicep Curls with Rotation and Dumbbell  - 1 x daily - 3 x weekly - 3 sets - 10 reps - Standing Pronated Elbow Flexion with Dumbbell  - 1 x daily - 3 x weekly - 3 sets - 10 reps - Standing Bicep Curls Neutral with Dumbbells  - 1 x daily - 3 x weekly - 3 sets - 10 reps  Education on SPOT it game and HART chart use, VOR x 1 viewing 3 x 5 rounds Brock string ~3x a day with spouse guiding through  GOALS: Goals reviewed with patient? Yes  SHORT TERM GOALS: Target date: 09/29/2023   Pt will be independent with initial HEP for improved balance, transfers and gait.  Baseline: not established on eval; provided Goal status: MET  2.  Patient will improve RPQ-3 score to </= 2 per item to indicate reduced severity of post-concussive symptoms to progress towards PLOF.    Baseline: 3/4, 3/4 Goal status: NOT MET  3.  5x STS to be assessed and STG/LTG updated  Baseline: 15.65 seconds without UE use (SBA) Goal status: Discontinued STG due to how well performed   4.  Patient will improve mCTSIB score to 100/120 to indicate improved integration of vestibular, proprioceptive, and visual balance systems during static balance tasks in order to reduce risk for falls.   Baseline: 86/120; 120/120 Goal status: MET   LONG TERM GOALS: Target date: 10/27/2023   Pt will be independent with final HEP for improved balance, transfers and gait.  Baseline: Reports partial compliance  Goal status: IN PROGRESS   2.  Patient will improve RPQ-13 score to </= 2 per item to indicate reduced severity of post-concussive symptoms to progress towards  PLOF.   Baseline: greatest deficit reported 4/4, greatest defciti 3/4 on fatige and feeling depressed Goal status: NOT MET BUT PROGRESSED   3.  Patient will improve their 5x Sit to Stand score to less than 13 seconds to demonstrate a decreased risk for falls and improved LE strength.   Baseline: 15.65 seconds without UE use (SBA); 9.2 seconds without UE use (SBA); improved to 9.2 seconds  Goal status: MET  4. Patient will improve mCTSIB score to 120/120 to indicate improved integration of vestibular, proprioceptive, and visual balance systems during static balance tasks in order to reduce risk for falls.   Baseline: 86/120; 120/120  Goal status: MET  NEW LONG TERM GOALS FOR UPDATED POC: Target date: 12/16/2023  Pt will ambulate greater than or equal to 1450' feet on with no AD mod I for improved cardiovascular endurance and BLE strength.   Baseline: 1207' no AD and SBA Goal status: PARTIALLY MET  2.  Pt will independent w/gym-based HEP for improved functional BUE/BLE strength, endurance and return to work.   Baseline: Pt can do AROM only w/LUE Goal status: PARTIALLY MET    ASSESSMENT:  CLINICAL IMPRESSION: Emphasis of skilled PT session on LTG assessment and DC from PT. Pt has partially met both LTGs and verbalized readiness to DC from PT this date. Therapist unable to formally assess this date (crowded clinic and time constraints), but pt has returned to work for 6 hour shifts w/no AD and very few seated rest breaks and was able to maintain balance while performing 180 degree turns and maintaining conversation with therapist. Pt overall is doing well physically and is most challenged w/deficits in short term memory. However, pt has significantly improved his cognitive abilities since eval and verbalizes improved self-awareness on his abilities/limitations. 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   OBJECTIVE IMPAIRMENTS: Abnormal gait, decreased activity tolerance, decreased balance, decreased cognition, decreased coordination, decreased endurance, decreased knowledge of condition, decreased knowledge of use of DME, decreased mobility, difficulty walking, decreased strength, decreased safety awareness, dizziness, impaired perceived functional ability, impaired vision/preception, and pain  ACTIVITY LIMITATIONS: carrying, lifting, bending, sleeping, stairs, transfers, hygiene/grooming, locomotion level, and caring for others  PARTICIPATION LIMITATIONS: meal prep, cleaning,  laundry, interpersonal relationship, driving, shopping, community activity, occupation, and yard work  PERSONAL FACTORS: Fitness, Past/current experiences, and 3+ comorbidities: Bradycardia, post-concussion w/LOC,   are also affecting patient's functional outcome.   REHAB POTENTIAL: Good  CLINICAL DECISION MAKING: Unstable/unpredictable  EVALUATION COMPLEXITY: High  PLAN:  PT FREQUENCY: 1x/week (recert)  PT DURATION: 4 weeks (recert)   PLANNED INTERVENTIONS: Therapeutic exercises, Therapeutic activity, Neuromuscular re-education, Balance training, Gait training, Patient/Family education, Self Care, Joint mobilization, Stair training, Vestibular training, Canalith repositioning, DME instructions, Aquatic Therapy, Dry Needling, Electrical stimulation, Manual therapy, and Re-evaluation   Brandis Wixted E Donel Osowski, PT, DPT 12/08/2023, 11:37 AM

## 2023-12-14 ENCOUNTER — Ambulatory Visit (HOSPITAL_COMMUNITY)
Admission: RE | Admit: 2023-12-14 | Discharge: 2023-12-14 | Disposition: A | Discharge status: Home / Self Care | Payer: PPO | Source: Ambulatory Visit | Attending: Surgery | Admitting: Surgery

## 2023-12-14 ENCOUNTER — Ambulatory Visit: Payer: PPO | Admitting: Physician Assistant

## 2023-12-14 VITALS — BP 147/83 | HR 61 | Temp 97.9°F | Resp 20 | Ht 65.0 in | Wt 138.0 lb

## 2023-12-14 DIAGNOSIS — I779 Disorder of arteries and arterioles, unspecified: Secondary | ICD-10-CM

## 2023-12-14 LAB — VAS US ABI WITH/WO TBI
Left ABI: 0.94
Right ABI: 1.09

## 2023-12-14 NOTE — Progress Notes (Signed)
[facility] [place] HISTORY AND PHYSICAL   History of Present Illness:  Patient is a 72 y.o. year old male who presents for evaluation of PAD.  He has a history of severe aortoiliac occlusive disease with history of progressive thigh and buttock claudication.   S/P 10/03/20 Procedure:   #1: Aortobifemoral bypass graft (14 x 7 dacryon)                         #2: Reimplantation of the inferior mesenteric artery (inferior mesenteric artery bypass)                         #3: Aortic endarterectomy                         #4: Bilateral common femoral, superficial femoral, and profundofemoral endarterectomy. He developed a left groin seroma.  I & D of left groin and vac placed, followed by recurrent  draining sinus tract in the left groin followed by return to the OR 01/02/21.  He was last seen in our office on 11/27/22.  He denies symptoms of claudication, non healing wounds or rest pain.   He suffered a fall on 08/25/2023 secondary to syncope. States that he was standing upright while shopping and had a prodrome of lightheadedness and then collapsed with a resulting head strike. CT head revealed scattered posttraumatic subarachnoid hemorrhages and nondisplaced fracture of the right temporoparietal calvarium. Neurosurgery was consulted though he did not require any neurosurgical interventions. A clear etiology of his syncope was not identified. He returned to cardiology clinic on 09/28/2023 reporting feeling lightheaded when his heart rates in the 40s prompting 2 weeks Zio patch placement.   On 10/06/23 he had a Pacemaker implantation.  He has been told not to take ASA since these events.  He is continuing to take a Statin daily.  He went through rehab and has been released to go back to the gym.  He is not allowed to drive independently yet.      Past Medical History:  Diagnosis Date   Alcohol abuse    Aortic stenosis    Arthritis    Asthma    exercise indused or pollen-uses inhaler  dail yand has rescue inhaler if needed   Bipolar disorder (HCC)    CAD (coronary artery disease)    coronary calcifications 08/2013 CTA   Carotid artery occlusion    Cataract    bilateral sx   Chronic back pain    Claudication (HCC)    Complication of anesthesia 10/16/2020   "had a panic attack when the mask was put on me"" I do not think I was given anything  before, I need something to calm me down"   Depression    Family history of skin cancer    Heart murmur    as an infant   Hepatitis    h/o 1970,doesn't remember which type,1990 epstain-barr   Herpes zoster without complication 09/05/2019   Hyperlipidemia    Hypertension    Neuromuscular disorder (HCC)    Obsessive compulsive disorder    PAD (peripheral artery disease) (HCC)    Peripheral neuropathy    Pneumonia    RBBB    RBBB (right bundle branch block with left anterior fascicular block)    Seasonal allergies    Severe aortic stenosis    Sleep apnea    does not wear CPAP  Smoker    Spinal stenosis at L4-L5 level    Tobacco abuse    Tobacco use disorder     Past Surgical History:  Procedure Laterality Date   ANAL FISTULECTOMY  09/25/2011   AORTA - BILATERAL FEMORAL ARTERY BYPASS GRAFT N/A 10/03/2020   Procedure: AORTA BIFEMORAL BYPASS GRAFT USING A HEMASHIELD GOLD BIFURCATED 14 X 7mm GRAFT AND INFERIOR MESENTERIC ARTERY REIMPLANTATION;  Surgeon: Nada Libman, MD;  Location: [facility] OR;  Service: Vascular;  Laterality: N/A;   AORTIC VALVE REPLACEMENT N/A 11/24/2019   Procedure: AORTIC VALVE REPLACEMENT (AVR) using INSPIRIS Resilia 23 MM Bioprosthetic Aortic Valve.;  Surgeon: Alleen Borne, MD;  Location: [facility] OR;  Service: Open Heart Surgery;  Laterality: N/A;   APPLICATION OF WOUND VAC Left 10/17/2020   Procedure: APPLICATION OF WOUND VAC;  Surgeon: Nada Libman, MD;  Location: [facility] OR;  Service: Vascular;  Laterality: Left;   BACK SURGERY     2015   CATARACT EXTRACTION, BILATERAL  2022   COLONOSCOPY  2009    MS-F/V-mov(exc)-HPP   CORONARY ARTERY BYPASS GRAFT N/A 11/24/2019   Procedure: CORONARY ARTERY BYPASS GRAFTING (CABG) using LIMA to LAD.;  Surgeon: Alleen Borne, MD;  Location: [facility] OR;  Service: Open Heart Surgery;  Laterality: N/A;   ELBOW SURGERY     bilaterally for cubital tunnel   ENDARTERECTOMY N/A 10/03/2020   Procedure: AORTIC ENDARTERECTOMY;  Surgeon: Nada Libman, MD;  Location: [facility];  Service: Vascular;  Laterality: N/A;   ENDARTERECTOMY FEMORAL Bilateral 10/03/2020   Procedure: BILATERAL FEMORAL ENDARTERECTOMY;  Surgeon: Nada Libman, MD;  Location: [facility];  Service: Vascular;  Laterality: Bilateral;   HERNIA REPAIR     with mesh   INCISION AND DRAINAGE Left 01/02/2021   Procedure: INCISION AND DRAINAGE LEFT GROIN WITH STIMULAN BEADS;  Surgeon: Nada Libman, MD;  Location: [facility];  Service: Vascular;  Laterality: Left;   INCISION AND DRAINAGE OF WOUND Left 10/17/2020   Procedure: EXPLORATION LEFT GROIN WOUND;  Surgeon: Nada Libman, MD;  Location: [facility];  Service: Vascular;  Laterality: Left;   MAXIMUM ACCESS (MAS)POSTERIOR LUMBAR INTERBODY FUSION (PLIF) 1 LEVEL N/A 10/25/2014   Procedure: LUMBAR FOUR TO FIVE MAXIMUM ACCESS (MAS) POSTERIOR LUMBAR INTERBODY FUSION (PLIF) 1 LEVEL;  Surgeon: Tia Alert, MD;  Location: [facility];  Service: Neurosurgery;  Laterality: N/A;   PACEMAKER IMPLANT N/A 10/06/2023   Procedure: PACEMAKER IMPLANT;  Surgeon: Regan Lemming, MD;  [facility]: [facility] INVASIVE CV [facility];  Service: Cardiovascular;  Laterality: N/A;   RIGHT/LEFT HEART CATH AND CORONARY ANGIOGRAPHY N/A 11/10/2019   Procedure: RIGHT/LEFT HEART CATH AND CORONARY ANGIOGRAPHY;  Surgeon: Swaziland, [name], MD;  Location: [facility] INV[facility] CV LAB;  Service: Cardiovascular;  Laterality: N/A;   SKIN BIOPSY Left 10/12/2018   shave forehead Hypertrophic actinic kertosis with features of a verruca   TEE WITHOUT CARDIOVERSION N/A 11/24/2019   Procedure: TRANSESOPHAGEAL ECHOCARDIOGRAM  (TEE);  Surgeon: Alleen Borne, MD;  Location: [facility] OR;  Service: Open Heart Surgery;  Laterality: N/A;   TONSILLECTOMY  age 80   VASECTOMY     X 2   VASECTOMY REVERSAL      ROS:   General:  No weight loss, Fever, chills  HEENT: No recent headaches, no nasal bleeding, no visual changes, no sore throat  Neurologic: No dizziness, blackouts, seizures. No recent symptoms of stroke or mini- stroke. No recent episodes of slurred speech, or temporary blindness.  Cardiac: No recent episodes  of chest pain/pressure, no shortness of breath at rest.  No shortness of breath with exertion.  Denies history of atrial fibrillation or irregular heartbeat  Vascular: No history of rest pain in feet.  No history of claudication.  No history of non-healing ulcer, No history of DVT   Pulmonary: No home oxygen, no productive cough, no hemoptysis,  No asthma or wheezing  Musculoskeletal:  [x ] Arthritis, [ ]  Low back pain,  [ ]  Joint pain  Hematologic:No history of hypercoagulable state.  No history of easy bleeding.  No history of anemia  Gastrointestinal: No hematochezia or melena,  No gastroesophageal reflux, no trouble swallowing  Urinary: [ ]  chronic Kidney disease, [ ]  on HD - [ ]  MWF or [ ]  TTHS, [ ]  Burning with urination, [ ]  Frequent urination, [ ]  Difficulty urinating;   Skin: No rashes  Psychological: No history of anxiety,  No history of depression  Social History Social History   Tobacco Use   Smoking status: Every Day    Current packs/day: 0.50    Average packs/day: 0.5 packs/day for 25.0 years (12.5 ttl pk-yrs)    Types: Cigarettes   Smokeless tobacco: Never   Tobacco comments:    Pt had stopped smoking X10 years/ currently smoking a 1.5 pack a day   Vaping Use   Vaping status: Never Used  Substance Use Topics   Alcohol use: Not Currently    Comment: 06/26/2014- last drink- [employer]   Drug use: No    Comment: former alcoholic    Family History Family History  Problem Relation Age  of Onset   Colon polyps Mother 66   Heart failure Mother    Stroke Father 43   Cancer Father        skin   Drug abuse Father    Hypertension Brother    Diabetes Brother    Drug abuse Brother    Heart disease Brother        s/p CABG, pacemaker   Cancer Maternal Aunt        skin   Cancer Maternal Grandmother        liver   Colon cancer Neg Hx    Esophageal cancer Neg Hx    Rectal cancer Neg Hx    Stomach cancer Neg Hx     Allergies  Allergies  Allergen Reactions   Codeine Anaphylaxis   Dihydrocodeine Anaphylaxis     Current Outpatient Medications  Medication Sig Dispense Refill   amLODipine (NORVASC) 10 MG tablet Take 1 tablet (10 mg total) by mouth daily. 90 tablet 3   amoxicillin (AMOXIL) 500 MG capsule Take 1 capsule (500 mg total) by mouth 2 (two) times daily. 20 capsule 1   amoxicillin-clavulanate (AUGMENTIN) 875-125 MG tablet Take 1 tablet by mouth 2 (two) times daily. 20 tablet 0   atorvastatin (LIPITOR) 80 MG tablet Take 1 tablet (80 mg total) by mouth daily. 90 tablet 3   azelastine (ASTELIN) 0.1 % nasal spray Place 1-2 sprays into both nostrils 2 (two) times daily as needed (nasal drainage). Use in each nostril as directed 30 mL 5   divalproex (DEPAKOTE ER) 500 MG 24 hr tablet Take 1 tablet (500 mg total) by mouth 3 (three) times daily. (Patient taking differently: Take 1,000 mg by mouth daily.) 279 tablet 1   finasteride (PROSCAR) 5 MG tablet Take 1 tablet (5 mg total) by mouth daily. 90 tablet 3   guaiFENesin (MUCINEX PO) Take 1 tablet by mouth as needed.  lamoTRIgine (LAMICTAL) 200 MG tablet Take 1 tablet (200 mg total) by mouth every morning. 90 tablet 3   lisinopril-hydrochlorothiazide (ZESTORETIC) 20-12.5 MG tablet Take 1 tablet by mouth every morning. 90 tablet 2   nicotine polacrilex (COMMIT) 4 MG lozenge Take 4 mg by mouth as needed for smoking cessation.     albuterol (VENTOLIN HFA) 108 (90 Base) MCG/ACT inhaler INHALE TWO PUFFS BY MOUTH EVERY 6 HOURS  AS NEEDED FOR WHEEZING (Patient not taking: Reported on 11/16/2023) 8.5 g 1   amoxicillin (AMOXIL) 500 MG capsule Take 4 capsules (2,000 mg total) by mouth once as needed for up to 1 dose. Take 4 tablets one hour before dental work (Patient not taking: Reported on 11/16/2023) 4 capsule 11   mometasone-formoterol (DULERA) 100-5 MCG/ACT AERO Inhale 2 puffs into the lungs 2 (two) times daily.     No current facility-administered medications for this visit.    Physical Examination  Vitals:   12/14/23 0817  BP: (!) 147/83  Pulse: 61  Resp: 20  Temp: 97.9 F (36.6 C)  TempSrc: Temporal  SpO2: 98%  Weight: 138 lb (62.6 kg)  Height: 5\' 5"  (1.651 m)    Body mass index is 22.96 kg/m.  General:  Alert and oriented, no acute distress HEENT: Normal Neck: No bruit or JVD Pulmonary: Clear to auscultation bilaterally Cardiac: Regular Rate and Rhythm without murmur Abdomen: Soft, non-tender, non-distended, no mass, no scars Skin: No rash Extremity Pulses:  radial, femoral, dorsalis pedis,  pulses bilaterally Musculoskeletal: No deformity or edema  Neurologic: Upper and lower extremity motor 5/5 and symmetric  DATA:     ABI Findings:  +---------+------------------+-----+--------+--------+  Right   Rt Pressure (mmHg)IndexWaveformComment   +---------+------------------+-----+--------+--------+  Brachial 161                                      +---------+------------------+-----+--------+--------+  PTA     176               1.09 biphasic          +---------+------------------+-----+--------+--------+  DP      132               0.82 biphasic          +---------+------------------+-----+--------+--------+  Great Toe101               0.63 Abnormal          +---------+------------------+-----+--------+--------+   +---------+------------------+-----+----------+-------+  Left    Lt Pressure (mmHg)IndexWaveform  Comment   +---------+------------------+-----+----------+-------+  Brachial 159                                       +---------+------------------+-----+----------+-------+  PTA     151               0.94 monophasic         +---------+------------------+-----+----------+-------+  DP      133               0.83 biphasic           +---------+------------------+-----+----------+-------+  Great Toe96                0.60 Abnormal           +---------+------------------+-----+----------+-------+   +-------+-----------+-----------+------------+------------+  ABI/TBIToday's ABIToday's TBIPrevious ABIPrevious TBI  +-------+-----------+-----------+------------+------------+  Right 1.09  0.63       1.09        0.71          +-------+-----------+-----------+------------+------------+  Left  0.94       0.60       0.83        0.44          +-------+-----------+-----------+------------+------------+      Right TBIs appear decreased. Left ABIs and TBIs appear increased.    Summary:  Right: Resting right ankle-brachial index is within normal range. The  right toe-brachial index is abnormal.   Left: Resting left ankle-brachial index indicates mild left lower  extremity arterial disease. The left toe-brachial index is abnormal.   ASSESSMENT/PLAN:  This is a 72 y.o. male who is s/p aortobifemoral bypass by Dr. Myra Gianotti. He had symptoms of life limiting claudication due to aortic occlusive disease.              He denies return of symptoms of claudication, non healing wounds or rest pain.  He works full time.  No change in the ABI's and he has palpable pedal pulses. He is returning to the gym and has been released by the neurosurgeon.  He is not on ASA at this point.  If it is OK with the neuro surgeon and the cariology team he would benefit from a daily ASA and continued Statin.  He and his wife are planning on stopping tobacco use for the new year.  F/U will be  schedule for 1 year with repeat ABI's. If he has concerns he will call our office.        Mosetta Pigeon PA-C [facility] [place] Office: [phone]  MD in clinic [place]

## 2023-12-21 ENCOUNTER — Telehealth: Payer: Self-pay | Admitting: Cardiology

## 2023-12-21 NOTE — Telephone Encounter (Signed)
Call patient to advise to show his pacemaker card when at the airport and the will have a protocol he will follow. Pt voiced understanding and appreciative of call.

## 2023-12-21 NOTE — Telephone Encounter (Signed)
  Patient is calling because he would like to discuss how to handle going through security at the airport with his pacemaker. He will be traveling before his follow up visit and he has some questions.

## 2023-12-21 NOTE — Telephone Encounter (Signed)
Returned call to Pt.  Advised that he should make check in security aware he has a pacemaker.  They will refer him to the body scanner instead of the metal detector.  Advised that they should not need to wand his pacemaker.

## 2023-12-29 ENCOUNTER — Other Ambulatory Visit: Payer: Self-pay

## 2023-12-29 DIAGNOSIS — I779 Disorder of arteries and arterioles, unspecified: Secondary | ICD-10-CM

## 2024-01-06 ENCOUNTER — Ambulatory Visit (INDEPENDENT_AMBULATORY_CARE_PROVIDER_SITE_OTHER): Payer: PPO

## 2024-01-06 DIAGNOSIS — R001 Bradycardia, unspecified: Secondary | ICD-10-CM

## 2024-01-06 LAB — CUP PACEART REMOTE DEVICE CHECK
Battery Remaining Longevity: 113 mo
Battery Remaining Percentage: 95.5 %
Battery Voltage: 3.02 V
Brady Statistic AP VP Percent: 62 %
Brady Statistic AP VS Percent: 23 %
Brady Statistic AS VP Percent: 10 %
Brady Statistic AS VS Percent: 4.4 %
Brady Statistic RA Percent Paced: 85 %
Brady Statistic RV Percent Paced: 73 %
Date Time Interrogation Session: 20250115020018
Implantable Lead Connection Status: 753985
Implantable Lead Connection Status: 753985
Implantable Lead Implant Date: 20241015
Implantable Lead Implant Date: 20241015
Implantable Lead Location: 753859
Implantable Lead Location: 753860
Implantable Pulse Generator Implant Date: 20241015
Lead Channel Impedance Value: 430 Ohm
Lead Channel Impedance Value: 440 Ohm
Lead Channel Pacing Threshold Amplitude: 0.75 V
Lead Channel Pacing Threshold Amplitude: 1.125 V
Lead Channel Pacing Threshold Pulse Width: 0.5 ms
Lead Channel Pacing Threshold Pulse Width: 0.5 ms
Lead Channel Sensing Intrinsic Amplitude: 3.4 mV
Lead Channel Sensing Intrinsic Amplitude: 4.3 mV
Lead Channel Setting Pacing Amplitude: 1 V
Lead Channel Setting Pacing Amplitude: 2.125
Lead Channel Setting Pacing Pulse Width: 0.5 ms
Lead Channel Setting Sensing Sensitivity: 0.7 mV
Pulse Gen Model: 2272
Pulse Gen Serial Number: 8218807

## 2024-01-07 ENCOUNTER — Telehealth: Payer: Self-pay | Admitting: Cardiology

## 2024-01-07 NOTE — Telephone Encounter (Signed)
Patient wants to reschedule his 91 day f/u PPM IMPLANT visit. Please call him at [phone].

## 2024-01-15 NOTE — Progress Notes (Unsigned)
  Electrophysiology Office Note:   ID:  Gregory [name], Gregory [name] 1951/10/02, MRN [id number]  Primary Cardiologist: Peter Swaziland, MD Electrophysiologist: Regan Lemming, MD  {Click to update primary MD,subspecialty MD or APP then REFRESH:1}    History of Present Illness:   Gregory [name] is a 73 y.o. male with h/o CAD/VHD s/p CABG/AVR (bioprosthetic) 2020 (did have post-op CHB though resolved without need for PPM, HTN, HLD, and PVD (b/l iliac disease s/p bypass) seen today for routine electrophysiology followup.   Since last being seen in our clinic the patient reports doing ***.  he denies chest pain, palpitations, dyspnea, PND, orthopnea, nausea, vomiting, dizziness, syncope, edema, weight gain, or early satiety.   Review of systems complete and found to be negative unless listed in HPI.   EP Information / Studies Reviewed:    EKG is ordered today. Personal review as below.       PPM Interrogation-  reviewed in detail today,  See PACEART report.  Arrhythmia/Device History Abbott Dual Chamber PPM implanted 09/2023 for Second degree AV block   Physical Exam:   VS:  There were no vitals taken for this visit.   Wt Readings from Last 3 Encounters:  12/14/23 138 lb (62.6 kg)  11/16/23 134 lb 9.6 oz (61.1 kg)  11/09/23 137 lb (62.1 kg)     GEN: No acute distress  NECK: No JVD; No carotid bruits CARDIAC: {EPRHYTHM:28826}, no murmurs, rubs, gallops RESPIRATORY:  Clear to auscultation without rales, wheezing or rhonchi  ABDOMEN: Soft, non-tender, non-distended EXTREMITIES:  {EDEMA LEVEL:28147::"No"} edema; No deformity   ASSESSMENT AND PLAN:    Second Degree AV block s/p Abbott PPM  Normal PPM function See Pace Art report No changes today  CAD s/p CABG Denies s/s ischemia  HTN Stable on current regimen   {Click here to Review PMH, Prob List, Meds, Allergies, SHx, FHx  :1}   Disposition:   Follow up with {EPPROVIDERS:28135} {EPFOLLOW UP:28173}  Signed, Graciella Freer, PA-C

## 2024-01-18 ENCOUNTER — Encounter: Payer: Self-pay | Admitting: Student

## 2024-01-18 ENCOUNTER — Ambulatory Visit: Payer: PPO | Attending: Student | Admitting: Student

## 2024-01-18 VITALS — BP 138/82 | HR 60 | Ht 65.0 in | Wt 139.6 lb

## 2024-01-18 DIAGNOSIS — Z950 Presence of cardiac pacemaker: Secondary | ICD-10-CM | POA: Diagnosis not present

## 2024-01-18 DIAGNOSIS — I10 Essential (primary) hypertension: Secondary | ICD-10-CM

## 2024-01-18 DIAGNOSIS — I442 Atrioventricular block, complete: Secondary | ICD-10-CM

## 2024-01-18 DIAGNOSIS — I2510 Atherosclerotic heart disease of native coronary artery without angina pectoris: Secondary | ICD-10-CM

## 2024-01-18 DIAGNOSIS — Z951 Presence of aortocoronary bypass graft: Secondary | ICD-10-CM | POA: Diagnosis not present

## 2024-01-18 LAB — CUP PACEART INCLINIC DEVICE CHECK
Battery Remaining Longevity: 110 mo
Battery Voltage: 3.01 V
Brady Statistic RA Percent Paced: 84 %
Brady Statistic RV Percent Paced: 76 %
Date Time Interrogation Session: 20250127092648
Implantable Lead Connection Status: 753985
Implantable Lead Connection Status: 753985
Implantable Lead Implant Date: 20241015
Implantable Lead Implant Date: 20241015
Implantable Lead Location: 753859
Implantable Lead Location: 753860
Implantable Pulse Generator Implant Date: 20241015
Lead Channel Impedance Value: 425 Ohm
Lead Channel Impedance Value: 475 Ohm
Lead Channel Pacing Threshold Amplitude: 0.875 V
Lead Channel Pacing Threshold Amplitude: 1 V
Lead Channel Pacing Threshold Pulse Width: 0.5 ms
Lead Channel Pacing Threshold Pulse Width: 0.5 ms
Lead Channel Sensing Intrinsic Amplitude: 4 mV
Lead Channel Sensing Intrinsic Amplitude: 4 mV
Lead Channel Setting Pacing Amplitude: 1.125
Lead Channel Setting Pacing Amplitude: 2 V
Lead Channel Setting Pacing Pulse Width: 0.5 ms
Lead Channel Setting Sensing Sensitivity: 0.7 mV
Pulse Gen Model: 2272
Pulse Gen Serial Number: 8218807

## 2024-01-18 NOTE — Patient Instructions (Signed)
Medication Instructions:  Your physician recommends that you continue on your current medications as directed. Please refer to the Current Medication list given to you today.  *If you need a refill on your cardiac medications before your next appointment, please call your pharmacy*  Lab Work: None ordered If you have labs (blood work) drawn today and your tests are completely normal, you will receive your results only by: MyChart Message (if you have MyChart) OR A paper copy in the mail If you have any lab test that is abnormal or we need to change your treatment, we will call you to review the results.  Follow-Up: At [facility], you and your health needs are our priority.  As part of our continuing mission to provide you with exceptional heart care, we have created designated Provider Care Teams.  These Care Teams include your primary Cardiologist (physician) and Advanced Practice Providers (APPs -  Physician Assistants and Nurse Practitioners) who all work together to provide you with the care you need, when you need it.  Your next appointment:   1 year(s)  Provider:   Loman Brooklyn, MD

## 2024-01-19 ENCOUNTER — Encounter: Payer: PPO | Admitting: Cardiology

## 2024-02-16 ENCOUNTER — Encounter: Payer: Self-pay | Admitting: Internal Medicine

## 2024-02-16 ENCOUNTER — Other Ambulatory Visit: Payer: Self-pay | Admitting: Family Medicine

## 2024-02-16 DIAGNOSIS — I10 Essential (primary) hypertension: Secondary | ICD-10-CM

## 2024-02-16 NOTE — Progress Notes (Signed)
 Remote pacemaker transmission.

## 2024-03-07 ENCOUNTER — Encounter: Payer: Self-pay | Admitting: Cardiology

## 2024-03-08 DIAGNOSIS — F900 Attention-deficit hyperactivity disorder, predominantly inattentive type: Secondary | ICD-10-CM | POA: Diagnosis not present

## 2024-03-08 DIAGNOSIS — N1831 Chronic kidney disease, stage 3a: Secondary | ICD-10-CM | POA: Diagnosis not present

## 2024-03-08 DIAGNOSIS — E7849 Other hyperlipidemia: Secondary | ICD-10-CM | POA: Diagnosis not present

## 2024-03-08 DIAGNOSIS — E559 Vitamin D deficiency, unspecified: Secondary | ICD-10-CM | POA: Diagnosis not present

## 2024-03-08 DIAGNOSIS — E1165 Type 2 diabetes mellitus with hyperglycemia: Secondary | ICD-10-CM | POA: Diagnosis not present

## 2024-03-16 ENCOUNTER — Ambulatory Visit: Payer: Self-pay

## 2024-03-16 NOTE — Telephone Encounter (Signed)
 Additional Information . Commented on: Cough has been present for > 3 weeks    Cough started Saturday : pt stated history of sinus infection: requesting ABX  Protocols used: Cough - Acute Productive-A-AH

## 2024-03-16 NOTE — Telephone Encounter (Signed)
 Copied from CRM [id number]. Topic: Clinical - Red Word Triage >> Mar 16, 2024 10:39 AM Gregory [name] wrote: Red Word that prompted transfer to Nurse Triage: patient called in, started Saturday as a head cold, patient has chest congestion,cough mucous has changed to green, and runny nose, patient has sinus pressure that started Monday.  Patient has asthma  Over all body aches.  Chief Complaint: cough Symptoms: cough, nasal & chest congestion, sputum & nasal discharge yellowish-green, low grade temp Frequency: Saturday Pertinent Negatives: Patient denies SOB Disposition: [] ED /[] Urgent Care (no appt availability in office) / [x] Appointment(In office/virtual)/ []  [facility]/ [] Home Care/ [] Refused Recommended Disposition /[] [facility]/ []  Follow-up with PCP Additional Notes: pt reports sinus infections a couple times of year due to allergies/sinus issues therefore would like to make an appointment to get ABX to assist getting over. Pt reported took COVID test- results negative:  c/o itchy ears  Reason for Disposition  [1] Sinus pain (not just congestion) AND [2] fever  Cough has been present for > 3 weeks  Answer Assessment - Initial Assessment Questions 1. ONSET: "When did the cough begin?"      Saturday 2. SEVERITY: "How bad is the cough today?"      Moderate  3. SPUTUM: "Describe the color of your sputum" (none, dry cough; clear, white, yellow, green)     Yellowish-green, thick 4. HEMOPTYSIS: "Are you coughing up any blood?" If so ask: "How much?" (flecks, streaks, tablespoons, etc.)     no 5. DIFFICULTY BREATHING: "Are you having difficulty breathing?" If Yes, ask: "How bad is it?" (e.g., mild, moderate, severe)    - MILD: No SOB at rest, mild SOB with walking, speaks normally in sentences, can lie down, no retractions, pulse < 100.    - MODERATE: SOB at rest, SOB with minimal exertion and prefers to sit, cannot lie down flat, speaks in phrases, mild retractions, audible  wheezing, pulse 100-120.    - SEVERE: Very SOB at rest, speaks in single words, struggling to breathe, sitting hunched forward, retractions, pulse > 120      no 6. FEVER: "Do you have a fever?" If Yes, ask: "What is your temperature, how was it measured, and when did it start?"     Low grade 7. CARDIAC HISTORY: "Do you have any history of heart disease?" (e.g., heart attack, congestive heart failure)      N/a 8. LUNG HISTORY: "Do you have any history of lung disease?"  (e.g., pulmonary embolus, asthma, emphysema)     N/a 9. PE RISK FACTORS: "Do you have a history of blood clots?" (or: recent major surgery, recent prolonged travel, bedridden)     N/a 10. OTHER SYMPTOMS: "Do you have any other symptoms?" (e.g., runny nose, wheezing, chest pain)       Nasal discharge- yellowish, pain over eyes, bilateral ear itching, nasal & chest congestion, low grade temp 11. PREGNANCY: "Is there any chance you are pregnant?" "When was your last menstrual period?"       N/a 12. TRAVEL: "Have you traveled out of the country in the last month?" (e.g., travel history, exposures)       N/a  Answer Assessment - Initial Assessment Questions 1. LOCATION: "Where does it hurt?"      Sinus areas 2. ONSET: "When did the sinus pain start?"  (e.g., hours, days)      Saturday 3. SEVERITY: "How bad is the pain?"   (Scale 1-10; mild, moderate or severe)   -  MILD (1-3): doesn't interfere with normal activities    - MODERATE (4-7): interferes with normal activities (e.g., work or school) or awakens from sleep   - SEVERE (8-10): excruciating pain and patient unable to do any normal activities    Mild to moderate 4. RECURRENT SYMPTOM: "Have you ever had sinus problems before?" If Yes, ask: "When was the last time?" and "What happened that time?"      Yes due to sinus issues - need ABX 5. NASAL CONGESTION: "Is the nose blocked?" If Yes, ask: "Can you open it or must you breathe through your mouth?"     yes 6. NASAL  DISCHARGE: "Do you have discharge from your nose?" If so ask, "What color?"     Yellowish green 7. FEVER: "Do you have a fever?" If Yes, ask: "What is it, how was it measured, and when did it start?"      Low grade 8. OTHER SYMPTOMS: "Do you have any other symptoms?" (e.g., sore throat, cough, earache, difficulty breathing)     Itchy ears, nasal and chest congestion, sputum greenish-yellow 9. PREGNANCY: "Is there any chance you are pregnant?" "When was your last menstrual period?"     N/a  Protocols used: Cough - Acute Productive-A-AH, Sinus Pain or Congestion-A-AH

## 2024-03-16 NOTE — Progress Notes (Unsigned)
 No chief complaint on file.   He last saw Dr. Susann Givens in November with 4d of nasal congestion, cough, PND, ST, sinus pressure. He was treated with Augmentin for sinus infection.   PMH, PSH, SH reviewed Pacemaker, CAD, HTN, HLD, allergies, asthma-COPD overlap, bipolar d/o, OSA  ROS:    PHYSICAL EXAM:  There were no vitals taken for this visit.      ASSESSMENT/PLAN:

## 2024-03-17 ENCOUNTER — Ambulatory Visit (INDEPENDENT_AMBULATORY_CARE_PROVIDER_SITE_OTHER): Admitting: Family Medicine

## 2024-03-17 ENCOUNTER — Encounter: Payer: Self-pay | Admitting: Family Medicine

## 2024-03-17 VITALS — BP 118/58 | HR 60 | Temp 97.1°F | Ht 65.0 in | Wt 143.4 lb

## 2024-03-17 DIAGNOSIS — J4530 Mild persistent asthma, uncomplicated: Secondary | ICD-10-CM

## 2024-03-17 DIAGNOSIS — I10 Essential (primary) hypertension: Secondary | ICD-10-CM | POA: Diagnosis not present

## 2024-03-17 DIAGNOSIS — J301 Allergic rhinitis due to pollen: Secondary | ICD-10-CM | POA: Diagnosis not present

## 2024-03-17 DIAGNOSIS — J019 Acute sinusitis, unspecified: Secondary | ICD-10-CM

## 2024-03-17 MED ORDER — FLUTICASONE PROPIONATE 50 MCG/ACT NA SUSP
2.0000 | Freq: Every day | NASAL | 6 refills | Status: AC
Start: 2024-03-17 — End: ?

## 2024-03-17 MED ORDER — AMOXICILLIN-POT CLAVULANATE 875-125 MG PO TABS
1.0000 | ORAL_TABLET | Freq: Two times a day (BID) | ORAL | 0 refills | Status: DC
Start: 2024-03-17 — End: 2024-10-11

## 2024-03-17 NOTE — Patient Instructions (Signed)
 Stay well hydrated. I recommend doing sinuses rinses twice daily (either Neti-pot or [employer]Med Sinus rinse kit). We discussed using boiled water, distilled water, or other prepared and safe saline solution. I encourage to you to get a 12 hour acting guaifenesin (ie Mucinex 12 hour --PLAIN). You may use a delsym syrup (dextromethorphan which is a cough suppressant) at bedtime (or twice daily if needed for cough).  I recommend resuming a nasal steroid spray (Flonase)--2 gentle sniffs into each nostril, once daily. Continue to use the astelin twice daily.  Hold off on starting the antibiotic for 1-2 days. If the mucus stays persistently discolored and if your sinus pain isn't improving, then start the antibiotic (and finish the whole course).

## 2024-04-06 ENCOUNTER — Ambulatory Visit (INDEPENDENT_AMBULATORY_CARE_PROVIDER_SITE_OTHER): Payer: PPO

## 2024-04-06 DIAGNOSIS — I442 Atrioventricular block, complete: Secondary | ICD-10-CM

## 2024-04-07 LAB — CUP PACEART REMOTE DEVICE CHECK
Battery Remaining Longevity: 109 mo
Battery Remaining Percentage: 95.5 %
Battery Voltage: 3.01 V
Brady Statistic AP VP Percent: 83 %
Brady Statistic AP VS Percent: 1 %
Brady Statistic AS VP Percent: 17 %
Brady Statistic AS VS Percent: 1 %
Brady Statistic RA Percent Paced: 83 %
Brady Statistic RV Percent Paced: 99 %
Date Time Interrogation Session: 20250416020014
Implantable Lead Connection Status: 753985
Implantable Lead Connection Status: 753985
Implantable Lead Implant Date: 20241015
Implantable Lead Implant Date: 20241015
Implantable Lead Location: 753859
Implantable Lead Location: 753860
Implantable Pulse Generator Implant Date: 20241015
Lead Channel Impedance Value: 430 Ohm
Lead Channel Impedance Value: 430 Ohm
Lead Channel Pacing Threshold Amplitude: 0.875 V
Lead Channel Pacing Threshold Amplitude: 0.875 V
Lead Channel Pacing Threshold Pulse Width: 0.5 ms
Lead Channel Pacing Threshold Pulse Width: 0.5 ms
Lead Channel Sensing Intrinsic Amplitude: 2.9 mV
Lead Channel Sensing Intrinsic Amplitude: 4 mV
Lead Channel Setting Pacing Amplitude: 1.125
Lead Channel Setting Pacing Amplitude: 1.875
Lead Channel Setting Pacing Pulse Width: 0.5 ms
Lead Channel Setting Sensing Sensitivity: 0.7 mV
Pulse Gen Model: 2272
Pulse Gen Serial Number: 8218807

## 2024-04-15 NOTE — Telephone Encounter (Signed)
 Letter has been discarded since patient hasn't picked it from our office.

## 2024-04-18 ENCOUNTER — Other Ambulatory Visit: Payer: Self-pay | Admitting: Family Medicine

## 2024-04-18 DIAGNOSIS — N401 Enlarged prostate with lower urinary tract symptoms: Secondary | ICD-10-CM

## 2024-04-19 ENCOUNTER — Ambulatory Visit: Payer: PPO

## 2024-04-19 VITALS — BP 118/60 | HR 60 | Temp 97.6°F | Ht 65.5 in | Wt 137.2 lb

## 2024-04-19 DIAGNOSIS — Z0000 Encounter for general adult medical examination without abnormal findings: Secondary | ICD-10-CM | POA: Diagnosis not present

## 2024-04-19 NOTE — Patient Instructions (Signed)
 Mr. Berber , Thank you for taking time to come for your Medicare Wellness Visit. I appreciate your ongoing commitment to your health goals. Please review the following plan we discussed and let me know if I can assist you in the future.   Referrals/Orders/Follow-Ups/Clinician Recommendations: none  This is a list of the screening recommended for you and due dates:  Health Maintenance  Topic Date Due   COVID-19 Vaccine (8 - Pfizer risk 2024-25 season) 04/19/2024   Flu Shot  07/22/2024   Cologuard (Stool DNA test)  10/21/2024   Medicare Annual Wellness Visit  04/19/2025   DTaP/Tdap/Td vaccine (3 - Td or Tdap) 04/13/2033   Hepatitis C Screening  Completed   Zoster (Shingles) Vaccine  Completed   HPV Vaccine  Aged Out   Meningitis B Vaccine  Aged Out   Pneumonia Vaccine  Discontinued   Colon Cancer Screening  Discontinued    Advanced directives: (In Chart) A copy of your advanced directives are scanned into your chart should your provider ever need it.  Next Medicare Annual Wellness Visit scheduled for next year: Yes  insert Preventive Care attachment Insert FALL PREVENTION attachment if needed

## 2024-04-19 NOTE — Progress Notes (Signed)
 Subjective:   Gregory [name] is a 72 y.o. who presents for a Medicare Wellness preventive visit.  Visit Complete: In person    Persons Participating in Visit: Patient.  AWV Questionnaire: Yes: Patient Medicare AWV questionnaire was completed by the patient on 04/15/2024; I have confirmed that all information answered by patient is correct and no changes since this date.  Cardiac Risk Factors include: advanced age (>55men, >72 women);dyslipidemia;hypertension;male gender     Objective:    Today's Vitals   04/19/24 0845 04/19/24 0846  BP: 118/60   Pulse: 60   Temp: 97.6 F (36.4 C)   TempSrc: Oral   SpO2: 99%   Weight: 137 lb 3.2 oz (62.2 kg)   Height: 5' 5.5" (1.664 m)   PainSc:  3    Body mass index is 22.48 kg/m.     04/19/2024    9:01 AM 10/05/2023    6:39 PM 09/01/2023    9:36 AM 08/26/2023   11:11 AM 04/14/2023    8:51 AM 04/11/2022    8:50 AM 04/03/2021    1:43 PM  Advanced Directives  Does Patient Have a Medical Advance Directive? Yes Yes Yes Yes Yes Yes Yes  Type of Estate agent of [place];Living will Healthcare Power of [place];Living will Living will;Healthcare Power of Attorney Living will;Healthcare Power of [employer] Power of [place];Living will Healthcare Power of [place];Living will Living will  Does patient want to make changes to medical advance directive?  No - Patient declined  No - Patient declined   No - Patient declined  Copy of Healthcare Power of Attorney in Chart? Yes - validated most recent copy scanned in chart (See row information) No - copy requested, Physician notified   Yes - validated most recent copy scanned in chart (See row information) Yes - validated most recent copy scanned in chart (See row information)     Current Medications (verified) Outpatient Encounter Medications as of 04/19/2024  Medication Sig   ADVAIR DISKUS 500-50 MCG/ACT AEPB Inhale 1 puff into the lungs in the morning and at  bedtime.   albuterol  (VENTOLIN  HFA) 108 (90 Base) MCG/ACT inhaler INHALE TWO PUFFS BY MOUTH EVERY 6 HOURS AS NEEDED FOR WHEEZING   amLODipine  (NORVASC ) 10 MG tablet Take 1 tablet (10 mg total) by mouth daily.   atorvastatin  (LIPITOR ) 80 MG tablet Take 1 tablet (80 mg total) by mouth daily.   azelastine  (ASTELIN ) 0.1 % nasal spray Place 1-2 sprays into both nostrils 2 (two) times daily as needed (nasal drainage). Use in each nostril as directed   divalproex  (DEPAKOTE  ER) 500 MG 24 hr tablet Take 1 tablet (500 mg total) by mouth 3 (three) times daily. (Patient taking differently: Take 1,500 mg by mouth at bedtime.)   finasteride  (PROSCAR ) 5 MG tablet TAKE 1 TABLET BY MOUTH DAILY   fluticasone  (FLONASE ) 50 MCG/ACT nasal spray Place 2 sprays into both nostrils daily.   guaiFENesin  (MUCINEX  PO) Take 1 tablet by mouth as needed.   lamoTRIgine  (LAMICTAL ) 200 MG tablet Take 1 tablet (200 mg total) by mouth every morning.   lisinopril -hydrochlorothiazide  (ZESTORETIC ) 20-12.5 MG tablet TAKE ONE TABLET BY MOUTH EVERY MORNING   amoxicillin  (AMOXIL ) 500 MG capsule Take 4 capsules (2,000 mg total) by mouth once as needed for up to 1 dose. Take 4 tablets one hour before dental work (Patient not taking: Reported on 11/16/2023)   amoxicillin -clavulanate (AUGMENTIN ) 875-125 MG tablet Take 1 tablet by mouth 2 (two) times daily.   No  facility-administered encounter medications on file as of 04/19/2024.    Allergies (verified) Codeine and Dihydrocodeine   History: Past Medical History:  Diagnosis Date   Alcohol abuse    Aortic stenosis    Arthritis    Asthma    exercise indused or pollen-uses inhaler dail yand has rescue inhaler if needed   Bipolar disorder (HCC)    CAD (coronary artery disease)    coronary calcifications 08/2013 CTA   Carotid artery occlusion    Cataract    bilateral sx   Chronic back pain    Claudication (HCC)    Complication of anesthesia 10/16/2020   "had a panic attack when the  mask was put on me"" I do not think I was given anything  before, I need something to calm me down"   Depression    Family history of skin cancer    Heart murmur    as an infant   Hepatitis    h/o 1970,doesn't remember which type,1990 epstain-barr   Herpes zoster without complication 09/05/2019   Hyperlipidemia    Hypertension    Neuromuscular disorder (HCC)    Obsessive compulsive disorder    PAD (peripheral artery disease) (HCC)    Peripheral neuropathy    Pneumonia    RBBB    RBBB (right bundle branch block with left anterior fascicular block)    Seasonal allergies    Severe aortic stenosis    Sleep apnea    does not wear CPAP   Smoker    Spinal stenosis at L4-L5 level    Tobacco abuse    Tobacco use disorder    Past Surgical History:  Procedure Laterality Date   ANAL FISTULECTOMY  09/25/2011   AORTA - BILATERAL FEMORAL ARTERY BYPASS GRAFT N/A 10/03/2020   Procedure: AORTA BIFEMORAL BYPASS GRAFT USING A HEMASHIELD GOLD BIFURCATED 14 X 7mm GRAFT AND INFERIOR MESENTERIC ARTERY REIMPLANTATION;  Surgeon: Margherita Shell, MD;  Location: [facility] OR;  Service: Vascular;  Laterality: N/A;   AORTIC VALVE REPLACEMENT N/A 11/24/2019   Procedure: AORTIC VALVE REPLACEMENT (AVR) using INSPIRIS Resilia 23 MM Bioprosthetic Aortic Valve.;  Surgeon: Bartley Lightning, MD;  Location: [facility] OR;  Service: Open Heart Surgery;  Laterality: N/A;   APPLICATION OF WOUND VAC Left 10/17/2020   Procedure: APPLICATION OF WOUND VAC;  Surgeon: Margherita Shell, MD;  Location: [facility] OR;  Service: Vascular;  Laterality: Left;   BACK SURGERY     2015   CATARACT EXTRACTION, BILATERAL  2022   COLONOSCOPY  2009   MS-F/V-mov(exc)-HPP   CORONARY ARTERY BYPASS GRAFT N/A 11/24/2019   Procedure: CORONARY ARTERY BYPASS GRAFTING (CABG) using LIMA to LAD.;  Surgeon: Bartley Lightning, MD;  Location: [facility];  Service: Open Heart Surgery;  Laterality: N/A;   ELBOW SURGERY     bilaterally for cubital tunnel   ENDARTERECTOMY N/A  10/03/2020   Procedure: AORTIC ENDARTERECTOMY;  Surgeon: Margherita Shell, MD;  Location: [facility];  Service: Vascular;  Laterality: N/A;   ENDARTERECTOMY FEMORAL Bilateral 10/03/2020   Procedure: BILATERAL FEMORAL ENDARTERECTOMY;  Surgeon: Margherita Shell, MD;  Location: [facility];  Service: Vascular;  Laterality: Bilateral;   HERNIA REPAIR     with mesh   INCISION AND DRAINAGE Left 01/02/2021   Procedure: INCISION AND DRAINAGE LEFT GROIN WITH STIMULAN BEADS;  Surgeon: Margherita Shell, MD;  Location: [facility];  Service: Vascular;  Laterality: Left;   INCISION AND DRAINAGE OF WOUND Left 10/17/2020   Procedure: EXPLORATION LEFT GROIN  WOUND;  Surgeon: Margherita Shell, MD;  Location: [facility] OR;  Service: Vascular;  Laterality: Left;   MAXIMUM ACCESS (MAS)POSTERIOR LUMBAR INTERBODY FUSION (PLIF) 1 LEVEL N/A 10/25/2014   Procedure: LUMBAR FOUR TO FIVE MAXIMUM ACCESS (MAS) POSTERIOR LUMBAR INTERBODY FUSION (PLIF) 1 LEVEL;  Surgeon: Isadora Mar, MD;  Location: [facility];  Service: Neurosurgery;  Laterality: N/A;   PACEMAKER IMPLANT N/A 10/06/2023   Procedure: PACEMAKER IMPLANT;  Surgeon: Lei Pump, MD;  [facility]: [facility] INVASIVE CV LAB;  Service: Cardiovascular;  Laterality: N/A;   RIGHT/LEFT HEART CATH AND CORONARY ANGIOGRAPHY N/A 11/10/2019   Procedure: RIGHT/LEFT HEART CATH AND CORONARY ANGIOGRAPHY;  Surgeon: Swaziland, [name], MD;  Location: [facility] INVASIVE CV LAB;  Service: Cardiovascular;  Laterality: N/A;   SKIN BIOPSY Left 10/12/2018   shave forehead Hypertrophic actinic kertosis with features of a verruca   TEE WITHOUT CARDIOVERSION N/A 11/24/2019   Procedure: TRANSESOPHAGEAL ECHOCARDIOGRAM (TEE);  Surgeon: Bartley Lightning, MD;  Location: [facility] OR;  Service: Open Heart Surgery;  Laterality: N/A;   TONSILLECTOMY  age 48   VASECTOMY     X 2   VASECTOMY REVERSAL     Family History  Problem Relation Age of Onset   Colon polyps Mother 31   Heart failure Mother    Stroke Father 28   Cancer Father         skin   Drug abuse Father    Hypertension Brother    Diabetes Brother    Drug abuse Brother    Heart disease Brother        s/p CABG, pacemaker   Cancer Maternal Aunt        skin   Cancer Maternal Grandmother        liver   Colon cancer Neg Hx    Esophageal cancer Neg Hx    Rectal cancer Neg Hx    Stomach cancer Neg Hx    Social History   Socioeconomic History   Marital status: Married    Spouse name: Not on file   Number of children: 6   Years of education: Not on file   Highest education level: Some college, no degree  Occupational History   Not on file  Tobacco Use   Smoking status: Every Day    Current packs/day: 0.50    Average packs/day: 0.5 packs/day for 25.0 years (12.5 ttl pk-yrs)    Types: Cigarettes   Smokeless tobacco: Never   Tobacco comments:    Pt had stopped smoking X10 years/ currently smoking a 1.5 pack a day   Vaping Use   Vaping status: Never Used  Substance and Sexual Activity   Alcohol use: Not Currently    Comment: 06/26/2014- last drink- AA   Drug use: No    Comment: former alcoholic   Sexual activity: Yes  Other Topics Concern   Not on file  Social History Narrative   Lives in [place] with wife.  Does not routinely exercise.  Sedentary r/t chronic lbp.   Social Drivers of Corporate investment banker Strain: Low Risk  (04/19/2024)   Overall Financial Resource Strain (CARDIA)    Difficulty of Paying Living Expenses: Not hard at all  Recent Concern: Financial Resource Strain - Medium Risk (03/16/2024)   Overall Financial Resource Strain (CARDIA)    Difficulty of Paying Living Expenses: Somewhat hard  Food Insecurity: No Food Insecurity (04/19/2024)   Hunger Vital Sign    Worried About Running Out of Food in the  Last Year: Never true    Ran Out of Food in the Last Year: Never true  Transportation Needs: No Transportation Needs (04/19/2024)   PRAPARE - Administrator, Civil Service (Medical): No    Lack of Transportation  (Non-Medical): No  Physical Activity: Sufficiently Active (04/19/2024)   Exercise Vital Sign    Days of Exercise per Week: 5 days    Minutes of Exercise per Session: 60 min  Stress: No Stress Concern Present (04/19/2024)   [employer] of Occupational Health - Occupational Stress Questionnaire    Feeling of Stress : Only a little  Social Connections: Socially Integrated (04/19/2024)   Social Connection and Isolation Panel [NHANES]    Frequency of Communication with Friends and Family: Three times a week    Frequency of Social Gatherings with Friends and Family: Once a week    Attends Religious Services: More than 4 times per year    Active Member of [employer] or Organizations: Yes    Attends Engineer, structural: More than 4 times per year    Marital Status: Married    Tobacco Counseling Ready to quit: Not Answered Counseling given: Not Answered Tobacco comments: Pt had stopped smoking X10 years/ currently smoking a 1.5 pack a day     Clinical Intake:  Pre-visit preparation completed: Yes  Pain : 0-10 Pain Score: 3  Pain Type: Chronic pain Pain Location: Neck Pain Descriptors / Indicators: Aching Pain Onset: More than a month ago Pain Frequency: Constant     Nutritional Status: BMI of 19-24  Normal Nutritional Risks: None Diabetes: No  Lab Results  Component Value Date   HGBA1C 5.9 (H) 10/04/2020   HGBA1C 5.9 (H) 11/22/2019     How often do you need to have someone help you when you read instructions, pamphlets, or other written materials from your doctor or pharmacy?: 1 - Never  Interpreter Needed?: No  Information entered by :: NAllen LPN   Activities of Daily Living     04/15/2024    8:55 AM 08/26/2023   11:11 AM  In your present state of health, do you have any difficulty performing the following activities:  Hearing? 0 1  Comment  right ear hearing decline  Vision? 0 1  Comment  uses glasses  Difficulty concentrating or making decisions? 0 0   Comment  reports bipolar history  Walking or climbing stairs? 0 0  Dressing or bathing? 0 0  Doing errands, shopping? 0 0  Preparing Food and eating ? N   Using the Toilet? N   In the past six months, have you accidently leaked urine? N   Do you have problems with loss of bowel control? N   Managing your Medications? N   Managing your Finances? N   Housekeeping or managing your Housekeeping? N     Patient Care Team: Watson Hacking, MD as PCP - General (Family Medicine) [place], [name], MD as PCP - Cardiology (Cardiology) Lei Pump, MD as PCP - Electrophysiology (Cardiology) [place], Daria Eddy, [facility] (Pharmacist)  Indicate any recent Medical Services you may have received from other than [facility] providers in the past year (date may be approximate).     Assessment:   This is a routine wellness examination for [place].  Hearing/Vision screen Hearing Screening - Comments:: Denies hearing issues Vision Screening - Comments:: Regular eye exams, Dr. Augustus Ledger, [place]   Goals Addressed             This  Visit's Progress    Patient Stated       04/19/2024, quit smoking completely, getting back to walking regularly around the neighborhood       Depression Screen     04/19/2024    9:05 AM 04/14/2023    8:52 AM 04/11/2022    8:53 AM 04/03/2021    1:45 PM 03/22/2020   10:31 AM 01/10/2020    8:40 AM 09/29/2019    2:05 PM  PHQ 2/9 Scores  PHQ - 2 Score 1 0 0 0 0 0 1  PHQ- 9 Score  0 0        Fall Risk     04/15/2024    8:55 AM 04/10/2023    9:04 AM 04/11/2022    8:52 AM 04/10/2022   11:24 PM 04/03/2021    1:44 PM  Fall Risk   Falls in the past year? 1 0 1 1 0  Comment   fell down the stairs    Number falls in past yr: 0 0 1 1 0  Injury with Fall? 1 0 0 0 0  Risk for fall due to : Medication side effect Medication side effect Impaired vision;Medication side effect  No Fall Risks  Follow up Falls prevention discussed;Falls evaluation completed Falls prevention  discussed;Education provided;Falls evaluation completed Falls evaluation completed;Education provided;Falls prevention discussed  Falls evaluation completed    MEDICARE RISK AT HOME:  Medicare Risk at Home Any stairs in or around the home?: (Patient-Rptd) Yes If so, are there any without handrails?: (Patient-Rptd) No Home free of loose throw rugs in walkways, pet beds, electrical cords, etc?: (Patient-Rptd) Yes Adequate lighting in your home to reduce risk of falls?: (Patient-Rptd) Yes Life alert?: (Patient-Rptd) No Use of a cane, walker or w/c?: (Patient-Rptd) No Grab bars in the bathroom?: (Patient-Rptd) No Shower chair or bench in shower?: (Patient-Rptd) No Elevated toilet seat or a handicapped toilet?: (Patient-Rptd) Yes  TIMED UP AND GO:  Was the test performed?  Yes  Length of time to ambulate 10 feet: 5 sec Gait steady and fast without use of assistive device  Cognitive Function: 6CIT completed        04/19/2024    9:06 AM 04/14/2023    8:54 AM 04/11/2022    8:55 AM  6CIT Screen  What Year? 0 points 0 points 0 points  What month? 0 points 0 points 0 points  What time? 0 points 0 points 0 points  Count back from 20 0 points 0 points 0 points  Months in reverse 0 points 0 points 0 points  Repeat phrase 0 points 0 points 6 points  Total Score 0 points 0 points 6 points    Immunizations Immunization History  Administered Date(s) Administered   Fluad Quad(high Dose 65+) 10/07/2021, 09/30/2022   Influenza Split 09/28/2012, 10/19/2014, 09/17/2015   Influenza Whole 10/08/2010   Influenza, High Dose Seasonal PF 10/22/2017, 10/12/2018, 08/24/2019, 09/24/2023   Influenza-Unspecified 09/21/2013, 10/09/2016, 10/22/2017   Moderna Covid-19 Fall Seasonal Vaccine 42yrs & older 04/18/2023, 05/01/2023   PFIZER(Purple Top)SARS-COV-2 Vaccination 01/30/2020, 02/24/2020, 11/11/2020   Pfizer Covid-19 Vaccine Bivalent Booster 93yrs & up 10/07/2021   Pfizer(Comirnaty)Fall Seasonal Vaccine  12 years and older 09/30/2022, 10/20/2023   Pneumococcal Conjugate-13 01/31/2016   Pneumococcal Polysaccharide-23 08/30/2013   Respiratory Syncytial Virus Vaccine,Recomb Aduvanted(Arexvy) 09/09/2022   Tdap 10/29/2012, 04/14/2023   Zoster Recombinant(Shingrix) 04/23/2017, 07/19/2017    Screening Tests Health Maintenance  Topic Date Due   COVID-19 Vaccine (8 - Pfizer risk 2024-25 season) 04/19/2024  INFLUENZA VACCINE  07/22/2024   Fecal DNA (Cologuard)  10/21/2024   Medicare Annual Wellness (AWV)  04/19/2025   DTaP/Tdap/Td (3 - Td or Tdap) 04/13/2033   Hepatitis C Screening  Completed   Zoster Vaccines- Shingrix  Completed   HPV VACCINES  Aged Out   Meningococcal B Vaccine  Aged Out   Pneumonia Vaccine 14+ Years old  Discontinued   Colonoscopy  Discontinued    Health Maintenance  Health Maintenance Due  Topic Date Due   COVID-19 Vaccine (8 - Pfizer risk 2024-25 season) 04/19/2024   Health Maintenance Items Addressed: Up to date  Additional Screening:  Vision Screening: Recommended annual ophthalmology exams for early detection of glaucoma and other disorders of the eye.  Dental Screening: Recommended annual dental exams for proper oral hygiene  Community Resource Referral / Chronic Care Management: CRR required this visit?  No   CCM required this visit?  No     Plan:     I have personally reviewed and noted the following in the patient's chart:   Medical and social history Use of alcohol, tobacco or illicit drugs  Current medications and supplements including opioid prescriptions. Patient is not currently taking opioid prescriptions. Functional ability and status Nutritional status Physical activity Advanced directives List of other physicians Hospitalizations, surgeries, and ER visits in previous 12 months Vitals Screenings to include cognitive, depression, and falls Referrals and appointments  In addition, I have reviewed and discussed with patient  certain preventive protocols, quality metrics, and best practice recommendations. A written personalized care plan for preventive services as well as general preventive health recommendations were provided to patient.     Areatha Beecham, LPN   03/30/8118   After Visit Summary: (In Person-Declined) Patient declined AVS at this time.  Notes: Nothing significant to report at this time.

## 2024-04-21 ENCOUNTER — Other Ambulatory Visit: Payer: Self-pay | Admitting: Family Medicine

## 2024-04-21 DIAGNOSIS — J3089 Other allergic rhinitis: Secondary | ICD-10-CM

## 2024-05-02 NOTE — Progress Notes (Unsigned)
 Complete physical exam  Patient: Gregory [name]   DOB: 03-30-1951   73 y.o. Male  MRN: [id number]  Subjective:     No chief complaint on file.   Gregory [name] is a 73 y.o. male who presents today for a complete physical exam. He reports consuming a {diet types:17450} diet. {types:19826} He generally feels {DESC; WELL/FAIRLY WELL/POORLY:18703}. He reports sleeping {DESC; WELL/FAIRLY WELL/POORLY:18703}. He {does/does not:200015} have additional problems to discuss today.    Most recent fall risk assessment:    04/15/2024    8:55 AM  Fall Risk   Falls in the past year? 1  Number falls in past yr: 0  Injury with Fall? 1  Risk for fall due to : Medication side effect  Follow up Falls prevention discussed;Falls evaluation completed     Most recent depression screenings:    04/19/2024    9:05 AM 04/14/2023    8:52 AM  PHQ 2/9 Scores  PHQ - 2 Score 1 0  PHQ- 9 Score  0    {VISON DENTAL STD PSA (Optional):27386}  {History (Optional):23778}  Patient Care Team: Watson Hacking, MD as PCP - General (Family Medicine) [place], [name], MD as PCP - Cardiology (Cardiology) Lei Pump, MD as PCP - Electrophysiology (Cardiology) Carnell Christian, [facility] (Pharmacist)   Outpatient Medications Prior to Visit  Medication Sig   ADVAIR DISKUS 500-50 MCG/ACT AEPB Inhale 1 puff into the lungs in the morning and at bedtime.   albuterol  (VENTOLIN  HFA) 108 (90 Base) MCG/ACT inhaler INHALE TWO PUFFS BY MOUTH EVERY 6 HOURS AS NEEDED FOR WHEEZING   amLODipine  (NORVASC ) 10 MG tablet Take 1 tablet (10 mg total) by mouth daily.   amoxicillin  (AMOXIL ) 500 MG capsule Take 4 capsules (2,000 mg total) by mouth once as needed for up to 1 dose. Take 4 tablets one hour before dental work (Patient not taking: Reported on 11/16/2023)   amoxicillin -clavulanate (AUGMENTIN ) 875-125 MG tablet Take 1 tablet by mouth 2 (two) times daily.   atorvastatin  (LIPITOR ) 80 MG tablet Take 1 tablet (80 mg  total) by mouth daily.   Azelastine  HCl 137 MCG/SPRAY SOLN INSTILL ONE TO TWO SPRAYS INTO BOTH NOSTRIL TWO TIMES A DAY AS NEEDED AS DIRECTED   divalproex  (DEPAKOTE  ER) 500 MG 24 hr tablet Take 1 tablet (500 mg total) by mouth 3 (three) times daily. (Patient taking differently: Take 1,500 mg by mouth at bedtime.)   finasteride  (PROSCAR ) 5 MG tablet TAKE 1 TABLET BY MOUTH DAILY   fluticasone  (FLONASE ) 50 MCG/ACT nasal spray Place 2 sprays into both nostrils daily.   guaiFENesin  (MUCINEX  PO) Take 1 tablet by mouth as needed.   lamoTRIgine  (LAMICTAL ) 200 MG tablet Take 1 tablet (200 mg total) by mouth every morning.   lisinopril -hydrochlorothiazide  (ZESTORETIC ) 20-12.5 MG tablet TAKE ONE TABLET BY MOUTH EVERY MORNING   No facility-administered medications prior to visit.    ROS        Objective:     There were no vitals taken for this visit. {Vitals History (Optional):23777}  Physical Exam   No results found for any visits on 05/03/24. {Show previous labs (optional):23779}    Assessment & Plan:    Routine Health Maintenance and Physical Exam  Immunization History  Administered Date(s) Administered   Fluad Quad(high Dose 65+) 10/07/2021, 09/30/2022   Influenza Split 09/28/2012, 10/19/2014, 09/17/2015   Influenza Whole 10/08/2010   Influenza, High Dose Seasonal PF 10/22/2017, 10/12/2018, 08/24/2019, 09/24/2023   Influenza-Unspecified 09/21/2013, 10/09/2016, 10/22/2017  Moderna Covid-19 Fall Seasonal Vaccine 83yrs & older 04/18/2023, 05/01/2023   PFIZER(Purple Top)SARS-COV-2 Vaccination 01/30/2020, 02/24/2020, 11/11/2020   Pfizer Covid-19 Vaccine Bivalent Booster 5yrs & up 10/07/2021   Pfizer(Comirnaty)Fall Seasonal Vaccine 12 years and older 09/30/2022, 10/20/2023   Pneumococcal Conjugate-13 01/31/2016   Pneumococcal Polysaccharide-23 08/30/2013   Respiratory Syncytial Virus Vaccine,Recomb Aduvanted(Arexvy) 09/09/2022   Tdap 10/29/2012, 04/14/2023   Zoster  Recombinant(Shingrix) 04/23/2017, 07/19/2017    Health Maintenance  Topic Date Due   COVID-19 Vaccine (8 - Pfizer risk 2024-25 season) 04/19/2024   INFLUENZA VACCINE  07/22/2024   Fecal DNA (Cologuard)  10/21/2024   Medicare Annual Wellness (AWV)  04/19/2025   DTaP/Tdap/Td (3 - Td or Tdap) 04/13/2033   Hepatitis C Screening  Completed   Zoster Vaccines- Shingrix  Completed   HPV VACCINES  Aged Out   Meningococcal B Vaccine  Aged Out   Pneumonia Vaccine 76+ Years old  Discontinued   Colonoscopy  Discontinued    Discussed health benefits of physical activity, and encouraged him to engage in regular exercise appropriate for his age and condition.  Problem List Items Addressed This Visit       Cardiovascular and Mediastinum   CAD (coronary artery disease) (Chronic)   Hypertension (Chronic)   PAD (peripheral artery disease) (HCC) (Chronic)     Respiratory   OSA (obstructive sleep apnea) (Chronic)   Allergic rhinitis due to pollen   Asthma-COPD overlap syndrome (HCC)   Mild persistent asthma   Perennial allergic rhinitis     Nervous and Auditory   Peripheral neuropathy (Chronic)   Substance induced mood disorder (HCC)     Other   Bipolar disorder (HCC) (Chronic)   Hyperlipidemia with target LDL less than 130 (Chronic)   Obsessive compulsive disorder (Chronic)   Recovering alcoholic in remission (HCC)   S/P aortic valve replacement with bioprosthetic valve   S/P aortobifemoral bypass surgery   S/P CABG x 1   S/P lumbar spinal fusion   Substance-induced sleep disorder (HCC)   Symptomatic bradycardia   Other Visit Diagnoses       Routine general medical examination at a health care facility    -  Primary     Essential hypertension         Benign prostatic hyperplasia with incomplete bladder emptying          No follow-ups on file.     Gregory [name], RMA

## 2024-05-03 ENCOUNTER — Ambulatory Visit (INDEPENDENT_AMBULATORY_CARE_PROVIDER_SITE_OTHER): Payer: PPO | Admitting: Family Medicine

## 2024-05-03 VITALS — BP 124/72 | HR 60 | Ht 65.5 in | Wt 136.4 lb

## 2024-05-03 DIAGNOSIS — Z0000 Encounter for general adult medical examination without abnormal findings: Secondary | ICD-10-CM

## 2024-05-03 DIAGNOSIS — Z23 Encounter for immunization: Secondary | ICD-10-CM | POA: Diagnosis not present

## 2024-05-03 DIAGNOSIS — J3089 Other allergic rhinitis: Secondary | ICD-10-CM

## 2024-05-03 DIAGNOSIS — I2510 Atherosclerotic heart disease of native coronary artery without angina pectoris: Secondary | ICD-10-CM

## 2024-05-03 DIAGNOSIS — Z951 Presence of aortocoronary bypass graft: Secondary | ICD-10-CM | POA: Diagnosis not present

## 2024-05-03 DIAGNOSIS — F19982 Other psychoactive substance use, unspecified with psychoactive substance-induced sleep disorder: Secondary | ICD-10-CM

## 2024-05-03 DIAGNOSIS — J4530 Mild persistent asthma, uncomplicated: Secondary | ICD-10-CM

## 2024-05-03 DIAGNOSIS — R001 Bradycardia, unspecified: Secondary | ICD-10-CM | POA: Diagnosis not present

## 2024-05-03 DIAGNOSIS — G588 Other specified mononeuropathies: Secondary | ICD-10-CM

## 2024-05-03 DIAGNOSIS — F319 Bipolar disorder, unspecified: Secondary | ICD-10-CM

## 2024-05-03 DIAGNOSIS — Z953 Presence of xenogenic heart valve: Secondary | ICD-10-CM

## 2024-05-03 DIAGNOSIS — F1994 Other psychoactive substance use, unspecified with psychoactive substance-induced mood disorder: Secondary | ICD-10-CM

## 2024-05-03 DIAGNOSIS — F3170 Bipolar disorder, currently in remission, most recent episode unspecified: Secondary | ICD-10-CM

## 2024-05-03 DIAGNOSIS — J4489 Other specified chronic obstructive pulmonary disease: Secondary | ICD-10-CM | POA: Diagnosis not present

## 2024-05-03 DIAGNOSIS — F1021 Alcohol dependence, in remission: Secondary | ICD-10-CM

## 2024-05-03 DIAGNOSIS — I10 Essential (primary) hypertension: Secondary | ICD-10-CM | POA: Diagnosis not present

## 2024-05-03 DIAGNOSIS — E785 Hyperlipidemia, unspecified: Secondary | ICD-10-CM

## 2024-05-03 DIAGNOSIS — I739 Peripheral vascular disease, unspecified: Secondary | ICD-10-CM

## 2024-05-03 DIAGNOSIS — Z981 Arthrodesis status: Secondary | ICD-10-CM

## 2024-05-03 DIAGNOSIS — N401 Enlarged prostate with lower urinary tract symptoms: Secondary | ICD-10-CM

## 2024-05-03 DIAGNOSIS — G4733 Obstructive sleep apnea (adult) (pediatric): Secondary | ICD-10-CM

## 2024-05-03 DIAGNOSIS — F422 Mixed obsessional thoughts and acts: Secondary | ICD-10-CM

## 2024-05-03 DIAGNOSIS — J301 Allergic rhinitis due to pollen: Secondary | ICD-10-CM

## 2024-05-03 DIAGNOSIS — Z95828 Presence of other vascular implants and grafts: Secondary | ICD-10-CM

## 2024-05-03 DIAGNOSIS — Z950 Presence of cardiac pacemaker: Secondary | ICD-10-CM | POA: Insufficient documentation

## 2024-05-03 LAB — LIPID PANEL

## 2024-05-03 MED ORDER — AMLODIPINE BESYLATE 10 MG PO TABS: 10.0000 mg | ORAL_TABLET | Freq: Every day | ORAL | 3 refills | Status: AC

## 2024-05-03 MED ORDER — LISINOPRIL-HYDROCHLOROTHIAZIDE 20-12.5 MG PO TABS: 1.0000 | ORAL_TABLET | Freq: Every morning | ORAL | 1 refills | Status: AC

## 2024-05-03 MED ORDER — DIVALPROEX SODIUM ER 500 MG PO TB24: 500.0000 mg | ORAL_TABLET | Freq: Three times a day (TID) | ORAL | 1 refills | Status: AC

## 2024-05-03 MED ORDER — ATORVASTATIN CALCIUM 80 MG PO TABS
80.0000 mg | ORAL_TABLET | Freq: Every day | ORAL | 3 refills | Status: DC
Start: 2024-05-03 — End: 2024-06-13

## 2024-05-03 MED ORDER — FINASTERIDE 5 MG PO TABS: 5.0000 mg | ORAL_TABLET | Freq: Every day | ORAL | 3 refills | Status: AC

## 2024-05-03 MED ORDER — LAMOTRIGINE 200 MG PO TABS: 200.0000 mg | ORAL_TABLET | Freq: Every morning | ORAL | 3 refills | Status: AC

## 2024-05-04 ENCOUNTER — Ambulatory Visit: Payer: Self-pay | Admitting: Family Medicine

## 2024-05-04 LAB — COMPREHENSIVE METABOLIC PANEL WITH GFR
ALT: 26 IU/L (ref 0–44)
AST: 32 IU/L (ref 0–40)
Albumin: 4.3 g/dL (ref 3.8–4.8)
Alkaline Phosphatase: 80 IU/L (ref 44–121)
BUN/Creatinine Ratio: 21 (ref 10–24)
BUN: 23 mg/dL (ref 8–27)
Bilirubin Total: 0.4 mg/dL (ref 0.0–1.2)
CO2: 23 mmol/L (ref 20–29)
Calcium: 9.5 mg/dL (ref 8.6–10.2)
Chloride: 101 mmol/L (ref 96–106)
Creatinine, Ser: 1.09 mg/dL (ref 0.76–1.27)
Globulin, Total: 3 g/dL (ref 1.5–4.5)
Glucose: 86 mg/dL (ref 70–99)
Potassium: 4.3 mmol/L (ref 3.5–5.2)
Sodium: 140 mmol/L (ref 134–144)
Total Protein: 7.3 g/dL (ref 6.0–8.5)
eGFR: 72 mL/min/{1.73_m2} (ref >59)

## 2024-05-04 LAB — CBC WITH DIFFERENTIAL/PLATELET
Basophils Absolute: 0.1 10*3/uL (ref 0.0–0.2)
Basos: 1 %
EOS (ABSOLUTE): 0.2 10*3/uL (ref 0.0–0.4)
Eos: 3 %
Hematocrit: 42.4 % (ref 37.5–51.0)
Hemoglobin: 13.7 g/dL (ref 13.0–17.7)
Immature Grans (Abs): 0 10*3/uL (ref 0.0–0.1)
Immature Granulocytes: 0 %
Lymphocytes Absolute: 2.2 10*3/uL (ref 0.7–3.1)
Lymphs: 38 %
MCH: 30.2 pg (ref 26.6–33.0)
MCHC: 32.3 g/dL (ref 31.5–35.7)
MCV: 93 fL (ref 79–97)
Monocytes Absolute: 0.8 10*3/uL (ref 0.1–0.9)
Monocytes: 13 %
Neutrophils Absolute: 2.6 10*3/uL (ref 1.4–7.0)
Neutrophils: 45 %
Platelets: 117 10*3/uL — ABNORMAL LOW (ref 150–450)
RBC: 4.54 x10E6/uL (ref 4.14–5.80)
RDW: 14.4 % (ref 11.6–15.4)
WBC: 5.9 10*3/uL (ref 3.4–10.8)

## 2024-05-04 LAB — LIPID PANEL
Cholesterol, Total: 117 mg/dL (ref 100–199)
HDL: 39 mg/dL — ABNORMAL LOW (ref >39)
LDL CALC COMMENT:: 3 ratio (ref 0.0–5.0)
LDL Chol Calc (NIH): 60 mg/dL (ref 0–99)
Triglycerides: 95 mg/dL (ref 0–149)
VLDL Cholesterol Cal: 18 mg/dL (ref 5–40)

## 2024-05-09 ENCOUNTER — Other Ambulatory Visit: Payer: Self-pay | Admitting: Family Medicine

## 2024-05-09 NOTE — Telephone Encounter (Signed)
 Last apt 05/03/24

## 2024-05-18 NOTE — Progress Notes (Signed)
 Remote pacemaker transmission.

## 2024-05-18 NOTE — Addendum Note (Signed)
 Addended by: Lott Rouleau A on: 05/18/2024 03:32 PM   Modules accepted: Orders

## 2024-06-03 ENCOUNTER — Other Ambulatory Visit: Payer: Self-pay | Admitting: Family Medicine

## 2024-06-03 DIAGNOSIS — J4530 Mild persistent asthma, uncomplicated: Secondary | ICD-10-CM

## 2024-06-11 ENCOUNTER — Other Ambulatory Visit: Payer: Self-pay | Admitting: Family Medicine

## 2024-06-11 DIAGNOSIS — E785 Hyperlipidemia, unspecified: Secondary | ICD-10-CM

## 2024-06-22 DIAGNOSIS — S069XAA Unspecified intracranial injury with loss of consciousness status unknown, initial encounter: Secondary | ICD-10-CM | POA: Insufficient documentation

## 2024-07-06 ENCOUNTER — Ambulatory Visit: Payer: PPO

## 2024-07-06 DIAGNOSIS — I442 Atrioventricular block, complete: Secondary | ICD-10-CM | POA: Diagnosis not present

## 2024-07-07 ENCOUNTER — Ambulatory Visit: Payer: Self-pay | Admitting: Cardiology

## 2024-07-07 LAB — CUP PACEART REMOTE DEVICE CHECK
Battery Remaining Longevity: 107 mo
Battery Remaining Percentage: 95 %
Battery Voltage: 2.99 V
Brady Statistic AP VP Percent: 84 %
Brady Statistic AP VS Percent: 1 %
Brady Statistic AS VP Percent: 14 %
Brady Statistic AS VS Percent: 1 %
Brady Statistic RA Percent Paced: 83 %
Brady Statistic RV Percent Paced: 99 %
Date Time Interrogation Session: 20250716020013
Implantable Lead Connection Status: 753985
Implantable Lead Connection Status: 753985
Implantable Lead Implant Date: 20241015
Implantable Lead Implant Date: 20241015
Implantable Lead Location: 753859
Implantable Lead Location: 753860
Implantable Pulse Generator Implant Date: 20241015
Lead Channel Impedance Value: 430 Ohm
Lead Channel Impedance Value: 440 Ohm
Lead Channel Pacing Threshold Amplitude: 0.75 V
Lead Channel Pacing Threshold Amplitude: 0.875 V
Lead Channel Pacing Threshold Pulse Width: 0.5 ms
Lead Channel Pacing Threshold Pulse Width: 0.5 ms
Lead Channel Sensing Intrinsic Amplitude: 2.9 mV
Lead Channel Sensing Intrinsic Amplitude: 5.7 mV
Lead Channel Setting Pacing Amplitude: 1.125
Lead Channel Setting Pacing Amplitude: 1.75 V
Lead Channel Setting Pacing Pulse Width: 0.5 ms
Lead Channel Setting Sensing Sensitivity: 0.7 mV
Pulse Gen Model: 2272
Pulse Gen Serial Number: 8218807

## 2024-07-19 DIAGNOSIS — H35342 Macular cyst, hole, or pseudohole, left eye: Secondary | ICD-10-CM | POA: Diagnosis not present

## 2024-07-19 DIAGNOSIS — H35373 Puckering of macula, bilateral: Secondary | ICD-10-CM | POA: Diagnosis not present

## 2024-07-19 DIAGNOSIS — H524 Presbyopia: Secondary | ICD-10-CM | POA: Diagnosis not present

## 2024-07-19 DIAGNOSIS — D3132 Benign neoplasm of left choroid: Secondary | ICD-10-CM | POA: Diagnosis not present

## 2024-07-19 DIAGNOSIS — H538 Other visual disturbances: Secondary | ICD-10-CM | POA: Diagnosis not present

## 2024-09-27 NOTE — Progress Notes (Signed)
 Remote PPM Transmission

## 2024-10-03 ENCOUNTER — Telehealth: Payer: Self-pay | Admitting: Family Medicine

## 2024-10-03 MED ORDER — SCOPOLAMINE 1 MG/3DAYS TD PT72: 1.0000 | MEDICATED_PATCH | TRANSDERMAL | 0 refills | Status: DC

## 2024-10-03 NOTE — Telephone Encounter (Signed)
 Copied from CRM [id number]. Topic: Clinical - Medication Refill >> Oct 03, 2024  2:05 PM Amber H wrote: Medication: Nausea patches that goes behind the ear (not sure of the name). Will be on cruise for 7 days.  Has the patient contacted their pharmacy? No, this is a new request.  (Agent: If no, request that the patient contact the pharmacy for the refill. If patient does not wish to contact the pharmacy document the reason why and proceed with request.) (Agent: If yes, when and what did the pharmacy advise?)  This is the patient's preferred pharmacy:  [facility] [id number] - [place], [place] - [address] [place] [address] Phone: [phone] Fax: [phone]  Is this the correct pharmacy for this prescription? Yes If no, delete pharmacy and type the correct one.   Has the prescription been filled recently? No  Is the patient out of the medication? No  Has the patient been seen for an appointment in the last year OR does the patient have an upcoming appointment? Yes  Can we respond through MyChart? Yes  Agent: Please be advised that Rx refills may take up to 3 business days. We ask that you follow-up with your pharmacy.

## 2024-10-05 ENCOUNTER — Ambulatory Visit: Payer: PPO

## 2024-10-05 DIAGNOSIS — I442 Atrioventricular block, complete: Secondary | ICD-10-CM

## 2024-10-05 DIAGNOSIS — R001 Bradycardia, unspecified: Secondary | ICD-10-CM

## 2024-10-06 LAB — CUP PACEART REMOTE DEVICE CHECK
Battery Remaining Longevity: 103 mo
Battery Remaining Percentage: 93 %
Battery Voltage: 2.99 V
Brady Statistic AP VP Percent: 84 %
Brady Statistic AP VS Percent: 1 %
Brady Statistic AS VP Percent: 15 %
Brady Statistic AS VS Percent: 1 %
Brady Statistic RA Percent Paced: 83 %
Brady Statistic RV Percent Paced: 99 %
Date Time Interrogation Session: 20251015020015
Implantable Lead Connection Status: 753985
Implantable Lead Connection Status: 753985
Implantable Lead Implant Date: 20241015
Implantable Lead Implant Date: 20241015
Implantable Lead Location: 753859
Implantable Lead Location: 753860
Implantable Pulse Generator Implant Date: 20241015
Lead Channel Impedance Value: 440 Ohm
Lead Channel Impedance Value: 450 Ohm
Lead Channel Pacing Threshold Amplitude: 0.75 V
Lead Channel Pacing Threshold Amplitude: 1.125 V
Lead Channel Pacing Threshold Pulse Width: 0.5 ms
Lead Channel Pacing Threshold Pulse Width: 0.5 ms
Lead Channel Sensing Intrinsic Amplitude: 2.7 mV
Lead Channel Sensing Intrinsic Amplitude: 5.7 mV
Lead Channel Setting Pacing Amplitude: 1.375
Lead Channel Setting Pacing Amplitude: 1.75 V
Lead Channel Setting Pacing Pulse Width: 0.5 ms
Lead Channel Setting Sensing Sensitivity: 0.7 mV
Pulse Gen Model: 2272
Pulse Gen Serial Number: 8218807

## 2024-10-10 NOTE — Progress Notes (Signed)
 Remote PPM Transmission

## 2024-10-11 ENCOUNTER — Ambulatory Visit (INDEPENDENT_AMBULATORY_CARE_PROVIDER_SITE_OTHER): Admitting: Family Medicine

## 2024-10-11 ENCOUNTER — Encounter: Payer: Self-pay | Admitting: Family Medicine

## 2024-10-11 VITALS — BP 134/68 | HR 60 | Ht 65.5 in | Wt 134.4 lb

## 2024-10-11 DIAGNOSIS — M7701 Medial epicondylitis, right elbow: Secondary | ICD-10-CM | POA: Diagnosis not present

## 2024-10-11 DIAGNOSIS — M7581 Other shoulder lesions, right shoulder: Secondary | ICD-10-CM

## 2024-10-11 NOTE — Patient Instructions (Signed)
 Use the Voltaren 3 times per day for your shoulder and your elbow and back off on any physical activities for the next 2 weeks to let everything quiet down okay then if you are pain-free you can start back slowly going about half of the amount of activity you are doing before good and then slowly increase that by about 10% every week.  If this does not work leave a message on MyChart and I will set you up to see Dr. Rocky follow-up

## 2024-10-11 NOTE — Progress Notes (Signed)
   Subjective:    Patient ID: Gregory [name], male    DOB: [birth year], 73 y.o.   MRN: [id number]  Discussed the use of AI scribe software for clinical note transcription with the patient, who gave verbal consent to proceed.  History of Present Illness   Gregory [name] is a 73 year old male with a history of aortic valve replacement and bradycardia who presents with shoulder and elbow pain.  He has been experiencing right shoulder pain with a clicking sensation for a couple of months. The pain worsens with activities such as lateral raises and using the elliptical machine. He describes the pain as more severe than the normal soreness on his left side and notes a pulling sensation during abduction and external rotation. He has been using Voltaren cream once or twice daily for relief.  He also reports elbow pain that started mysteriously a couple of weeks ago. The pain is localized to a specific spot on the elbow and radiates down the arm. He initially thought it might be due to a past staph infection but notes there is no fever or visible signs of infection. He applies Voltaren cream to the elbow as well.  He has a history of aortic valve replacement in 2020 and was advised against using ibuprofen or Aleve. He takes Tylenol  650 mg as needed for pain, particularly before weightlifting. He is not on any blood thinners but takes HCTZ for blood pressure management.  He resumed workouts in late November after a fall in early September resulted in a fractured skull and brain bleed. He underwent three months of physical and occupational therapy. He gradually increased his workout intensity, starting with range of motion exercises and progressing to weightlifting and cardio. He currently does cardio two to three times a week and weightlifting twice a week, using light weights.           Review of Systems     Objective:    Physical Exam Physical Exam   MUSCULOSKELETAL: Right shoulder  sore, primarily in the posterior shoulder, with pain on abduction and external rotation.  Drop arm test was uncomfortable.  Sulcus sign negative.  No tenderness over AC joint or bicipital groove.  Neer's and Hawkins test was uncomfortable.  Right elbow shows full motion.  Slight tenderness to palpation over the medial epicondyle.  Provocative testing was slightly uncomfortable.          Assessment & Plan:     Right shoulder tendinitis with clicking and soreness, likely due to overuse from weightlifting. No infection - Use Voltaren cream three times daily on shoulder. - Avoid weightlifting and shoulder-intensive activities for two weeks. - If pain-free after two weeks, resume activities at 50% intensity, increasing by 10% weekly. - If symptoms persist, contact office for follow-up with Dr. Vita, possible ultrasound.  Right elbow tendinitis Right elbow tendinitis with localized soreness, likely due to overuse from weightlifting. No infection. - Use Voltaren cream three times daily on elbow. - Avoid weightlifting and elbow-intensive activities for two weeks. - If pain-free after two weeks, resume activities at 50% intensity, increasing by 10% weekly. - If symptoms persist, contact office for follow-up with Dr. Vita, possible ultrasound.

## 2024-10-19 ENCOUNTER — Ambulatory Visit: Payer: Self-pay | Admitting: Cardiology

## 2024-11-08 ENCOUNTER — Encounter (INDEPENDENT_AMBULATORY_CARE_PROVIDER_SITE_OTHER): Payer: PPO | Admitting: Ophthalmology

## 2024-11-08 ENCOUNTER — Ambulatory Visit: Payer: Self-pay

## 2024-11-08 ENCOUNTER — Encounter (INDEPENDENT_AMBULATORY_CARE_PROVIDER_SITE_OTHER): Payer: Self-pay

## 2024-11-08 NOTE — Telephone Encounter (Signed)
\  FYI Only or Action Required?: FYI only for provider: appointment scheduled on 11/09/24.  Patient was last seen in primary care on 10/11/2024 by Joyce Norleen BROCKS, MD.  Called Nurse Triage reporting Cough.  Symptoms began a week ago.  Interventions attempted: OTC medications: Mucinex  and Rest, hydration, or home remedies.  Symptoms are: gradually worsening.  Triage Disposition: See Physician Within 24 Hours  Patient/caregiver understands and will follow disposition?: Yes Reason for Disposition  Earache  Answer Assessment - Initial Assessment Questions Taking Mucinex  DM  1. ONSET: When did the cough begin?      Week ago  2. SPUTUM: Describe the color of your sputum (e.g., none, dry cough; clear, white, yellow, green)     Clear  3. DIFFICULTY BREATHING: Are you having difficulty breathing? If Yes, ask: How bad is it? (e.g., mild, moderate, severe)      Denies  4. FEVER: Do you have a fever? If Yes, ask: What is your temperature, how was it measured, and when did it start?     Denies  5. LUNG HISTORY: Do you have any history of lung disease?  (e.g., pulmonary embolus, asthma, emphysema)     Asthma  6. OTHER SYMPTOMS: Do you have any other symptoms? (e.g., runny nose, wheezing, chest pain)       Earache, post nasal drip, pain in the gums, above and below eyes  Protocols used: Cough - Acute Productive-A-AH  Copied from CRM #[id number]. Topic: Clinical - Red Word Triage >> Nov 08, 2024  4:10 PM [name] wrote: Red Word that prompted transfer to Nurse Triage: Symptoms: productive coughing, pain in ears, nasal drip, pain in eyes, sore throat, patient has been taking over the counter meds but his symptoms are getting worse.

## 2024-11-09 ENCOUNTER — Encounter: Payer: Self-pay | Admitting: Family Medicine

## 2024-11-09 ENCOUNTER — Ambulatory Visit (INDEPENDENT_AMBULATORY_CARE_PROVIDER_SITE_OTHER): Admitting: Family Medicine

## 2024-11-09 VITALS — BP 132/70 | HR 60 | Ht 65.5 in | Wt 134.6 lb

## 2024-11-09 DIAGNOSIS — J0100 Acute maxillary sinusitis, unspecified: Secondary | ICD-10-CM | POA: Diagnosis not present

## 2024-11-09 DIAGNOSIS — H6502 Acute serous otitis media, left ear: Secondary | ICD-10-CM

## 2024-11-09 MED ORDER — AMOXICILLIN-POT CLAVULANATE 875-125 MG PO TABS
1.0000 | ORAL_TABLET | Freq: Two times a day (BID) | ORAL | 0 refills | Status: AC
Start: 2024-11-09 — End: ?

## 2024-11-09 NOTE — Progress Notes (Signed)
   Subjective:    Patient ID: Gregory [name], male    DOB: 1951/01/10, 73 y.o.   MRN: [id number]  Discussed the use of AI scribe software for clinical note transcription with the patient, who gave verbal consent to proceed.  History of Present Illness   Gregory [name] is a 73 year old male who presents with symptoms suggestive of a sinus infection and ear pain.  He has been experiencing significant pain above and below his eyes for the past nine to ten days, accompanied by nasal congestion and thickened mucus, which he is able to expel through his nose. He also has a cough that has transitioned from nonproductive to productive, allowing him to clear mucus.  He reports ear pain, primarily in the left ear, described as pressure. Additionally, he notes pain in his upper teeth on both sides, exacerbated by biting down. He has a history of needing a root canal.  He has been taking Tylenol  650 mg every six hours to manage his pain, which is more frequent than his usual intake before and after gym sessions. He is also using his inhalers regularly, although he denies experiencing shortness of breath, attributing his breathing difficulties to mouth breathing due to nasal congestion.  No shortness of breath but mouth breathing due to nasal congestion. He confirms ear pain, nasal congestion, and a productive cough.           Review of Systems     Objective:    Physical Exam Left tympanic membrane is slightly dull.  Right is normal.  Tender to palpation over frontal as well as maxillary sinuses.  Throat is clear.  Neck is supple without adenopathy.      Assessment & Plan:  Acute maxillary sinusitis - Prescribed Augmentin  for 10 days. - Advised to leave a message on MyChart if not back to normal after completing antibiotics.  Acute serous otitis media, left ear - Prescribed Augmentin  for 10 days. - Advised to leave a message on MyChart if not back to normal after completing  antibiotics.

## 2025-01-04 ENCOUNTER — Ambulatory Visit: Payer: PPO

## 2025-01-04 DIAGNOSIS — I442 Atrioventricular block, complete: Secondary | ICD-10-CM | POA: Diagnosis not present

## 2025-01-05 LAB — CUP PACEART REMOTE DEVICE CHECK
Battery Remaining Longevity: 103 mo
Battery Remaining Percentage: 90 %
Battery Voltage: 2.99 V
Brady Statistic AP VP Percent: 84 %
Brady Statistic AP VS Percent: 1 %
Brady Statistic AS VP Percent: 15 %
Brady Statistic AS VS Percent: 1 %
Brady Statistic RA Percent Paced: 83 %
Brady Statistic RV Percent Paced: 99 %
Date Time Interrogation Session: 20260114031331
Implantable Lead Connection Status: 753985
Implantable Lead Connection Status: 753985
Implantable Lead Implant Date: 20241015
Implantable Lead Implant Date: 20241015
Implantable Lead Location: 753859
Implantable Lead Location: 753860
Implantable Pulse Generator Implant Date: 20241015
Lead Channel Impedance Value: 440 Ohm
Lead Channel Impedance Value: 450 Ohm
Lead Channel Pacing Threshold Amplitude: 0.75 V
Lead Channel Pacing Threshold Amplitude: 0.75 V
Lead Channel Pacing Threshold Pulse Width: 0.5 ms
Lead Channel Pacing Threshold Pulse Width: 0.5 ms
Lead Channel Sensing Intrinsic Amplitude: 3 mV
Lead Channel Sensing Intrinsic Amplitude: 5.7 mV
Lead Channel Setting Pacing Amplitude: 1 V
Lead Channel Setting Pacing Amplitude: 1.75 V
Lead Channel Setting Pacing Pulse Width: 0.5 ms
Lead Channel Setting Sensing Sensitivity: 0.7 mV
Pulse Gen Model: 2272
Pulse Gen Serial Number: 8218807

## 2025-01-06 ENCOUNTER — Ambulatory Visit: Payer: Self-pay | Admitting: Cardiology

## 2025-01-11 NOTE — Progress Notes (Signed)
 Remote PPM Transmission

## 2025-01-26 ENCOUNTER — Encounter (INDEPENDENT_AMBULATORY_CARE_PROVIDER_SITE_OTHER): Admitting: Ophthalmology

## 2025-02-16 ENCOUNTER — Encounter (INDEPENDENT_AMBULATORY_CARE_PROVIDER_SITE_OTHER): Admitting: Ophthalmology

## 2025-04-25 ENCOUNTER — Ambulatory Visit: Payer: Self-pay

## 2025-05-04 ENCOUNTER — Ambulatory Visit: Payer: Self-pay | Admitting: Family Medicine

## 2025-05-18 ENCOUNTER — Ambulatory Visit: Admitting: Cardiology
